# Patient Record
Sex: Female | Born: 1945 | Race: White | Hispanic: No | Smoking: Former smoker
Health system: Southern US, Community
[De-identification: ages and names within clinical notes are randomized; demographics above are authoritative.]

## PROBLEM LIST (undated history)

## (undated) DIAGNOSIS — J189 Pneumonia, unspecified organism: Secondary | ICD-10-CM

## (undated) DIAGNOSIS — I739 Peripheral vascular disease, unspecified: Secondary | ICD-10-CM

## (undated) DIAGNOSIS — R0600 Dyspnea, unspecified: Secondary | ICD-10-CM

## (undated) DIAGNOSIS — Z8619 Personal history of other infectious and parasitic diseases: Secondary | ICD-10-CM

## (undated) DIAGNOSIS — Z8719 Personal history of other diseases of the digestive system: Secondary | ICD-10-CM

## (undated) DIAGNOSIS — R112 Nausea with vomiting, unspecified: Secondary | ICD-10-CM

## (undated) DIAGNOSIS — E119 Type 2 diabetes mellitus without complications: Secondary | ICD-10-CM

## (undated) DIAGNOSIS — Z9889 Other specified postprocedural states: Secondary | ICD-10-CM

## (undated) DIAGNOSIS — Z923 Personal history of irradiation: Secondary | ICD-10-CM

## (undated) DIAGNOSIS — M1990 Unspecified osteoarthritis, unspecified site: Secondary | ICD-10-CM

## (undated) DIAGNOSIS — C801 Malignant (primary) neoplasm, unspecified: Secondary | ICD-10-CM

## (undated) DIAGNOSIS — C3490 Malignant neoplasm of unspecified part of unspecified bronchus or lung: Secondary | ICD-10-CM

## (undated) DIAGNOSIS — F17200 Nicotine dependence, unspecified, uncomplicated: Secondary | ICD-10-CM

## (undated) HISTORY — DX: Unspecified osteoarthritis, unspecified site: M19.90

## (undated) HISTORY — DX: Personal history of irradiation: Z92.3

## (undated) HISTORY — PX: FRACTURE SURGERY: SHX138

## (undated) HISTORY — PX: CATARACT EXTRACTION W/ INTRAOCULAR LENS IMPLANT: SHX1309

## (undated) HISTORY — DX: Type 2 diabetes mellitus without complications: E11.9

## (undated) HISTORY — DX: Personal history of other infectious and parasitic diseases: Z86.19

## (undated) HISTORY — PX: ANKLE FRACTURE SURGERY: SHX122

## (undated) HISTORY — PX: CATARACT EXTRACTION: SUR2

## (undated) HISTORY — DX: Nicotine dependence, unspecified, uncomplicated: F17.200

## (undated) HISTORY — PX: TUBAL LIGATION: SHX77

---

## 1976-04-13 HISTORY — PX: BREAST BIOPSY: SHX20

## 1979-04-14 HISTORY — PX: OTHER SURGICAL HISTORY: SHX169

## 1983-04-14 HISTORY — PX: BREAST EXCISIONAL BIOPSY: SUR124

## 1998-10-07 ENCOUNTER — Other Ambulatory Visit: Admission: RE | Admit: 1998-10-07 | Discharge: 1998-10-07 | Payer: Self-pay | Admitting: *Deleted

## 2000-01-26 ENCOUNTER — Other Ambulatory Visit: Admission: RE | Admit: 2000-01-26 | Discharge: 2000-01-26 | Payer: Self-pay | Admitting: *Deleted

## 2004-06-23 ENCOUNTER — Other Ambulatory Visit: Admission: RE | Admit: 2004-06-23 | Discharge: 2004-06-23 | Payer: Self-pay | Admitting: Obstetrics and Gynecology

## 2004-06-24 ENCOUNTER — Encounter: Admission: RE | Admit: 2004-06-24 | Discharge: 2004-06-24 | Payer: Self-pay | Admitting: Obstetrics and Gynecology

## 2006-04-14 ENCOUNTER — Ambulatory Visit (HOSPITAL_COMMUNITY): Admission: RE | Admit: 2006-04-14 | Discharge: 2006-04-14 | Payer: Self-pay | Admitting: Orthopedic Surgery

## 2006-05-06 ENCOUNTER — Encounter: Admission: RE | Admit: 2006-05-06 | Discharge: 2006-06-29 | Payer: Self-pay | Admitting: Orthopedic Surgery

## 2006-10-25 ENCOUNTER — Other Ambulatory Visit: Admission: RE | Admit: 2006-10-25 | Discharge: 2006-10-25 | Payer: Self-pay | Admitting: Obstetrics and Gynecology

## 2009-04-13 HISTORY — PX: POLYPECTOMY: SHX149

## 2009-04-13 HISTORY — PX: BREAST EXCISIONAL BIOPSY: SUR124

## 2009-04-13 HISTORY — PX: COLONOSCOPY: SHX174

## 2009-09-03 ENCOUNTER — Other Ambulatory Visit: Admission: RE | Admit: 2009-09-03 | Discharge: 2009-09-03 | Payer: Self-pay | Admitting: Family Medicine

## 2009-09-03 ENCOUNTER — Encounter (INDEPENDENT_AMBULATORY_CARE_PROVIDER_SITE_OTHER): Payer: Self-pay | Admitting: *Deleted

## 2009-09-03 ENCOUNTER — Ambulatory Visit: Payer: Self-pay | Admitting: Family Medicine

## 2009-09-03 DIAGNOSIS — Z9189 Other specified personal risk factors, not elsewhere classified: Secondary | ICD-10-CM | POA: Insufficient documentation

## 2009-09-03 DIAGNOSIS — M129 Arthropathy, unspecified: Secondary | ICD-10-CM | POA: Insufficient documentation

## 2009-09-03 DIAGNOSIS — F17200 Nicotine dependence, unspecified, uncomplicated: Secondary | ICD-10-CM | POA: Insufficient documentation

## 2009-09-03 DIAGNOSIS — E1169 Type 2 diabetes mellitus with other specified complication: Secondary | ICD-10-CM | POA: Insufficient documentation

## 2009-09-03 DIAGNOSIS — E7800 Pure hypercholesterolemia, unspecified: Secondary | ICD-10-CM

## 2009-09-03 DIAGNOSIS — J309 Allergic rhinitis, unspecified: Secondary | ICD-10-CM | POA: Insufficient documentation

## 2009-09-03 LAB — HM PAP SMEAR

## 2009-09-03 LAB — CONVERTED CEMR LAB
Cholesterol, target level: 200 mg/dL
HDL goal, serum: 40 mg/dL
LDL Goal: 160 mg/dL

## 2009-09-12 LAB — CONVERTED CEMR LAB: Pap Smear: NEGATIVE

## 2009-09-13 ENCOUNTER — Encounter (INDEPENDENT_AMBULATORY_CARE_PROVIDER_SITE_OTHER): Payer: Self-pay | Admitting: *Deleted

## 2009-09-17 ENCOUNTER — Ambulatory Visit: Payer: Self-pay | Admitting: Family Medicine

## 2009-09-23 LAB — CONVERTED CEMR LAB
ALT: 16 units/L (ref 0–35)
AST: 20 units/L (ref 0–37)
Albumin: 4.4 g/dL (ref 3.5–5.2)
Alkaline Phosphatase: 88 units/L (ref 39–117)
BUN: 12 mg/dL (ref 6–23)
Bilirubin, Direct: 0.1 mg/dL (ref 0.0–0.3)
CO2: 26 meq/L (ref 19–32)
Calcium: 10 mg/dL (ref 8.4–10.5)
Chloride: 107 meq/L (ref 96–112)
Cholesterol: 326 mg/dL — ABNORMAL HIGH (ref 0–200)
Creatinine, Ser: 0.7 mg/dL (ref 0.4–1.2)
Direct LDL: 213.5 mg/dL
GFR calc non Af Amer: 86.26 mL/min (ref >60)
Glucose, Bld: 130 mg/dL — ABNORMAL HIGH (ref 70–99)
HDL: 64.2 mg/dL (ref >39.00)
Potassium: 4.3 meq/L (ref 3.5–5.1)
Sodium: 142 meq/L (ref 135–145)
TSH: 2.05 microintl units/mL (ref 0.35–5.50)
Total Bilirubin: 0.8 mg/dL (ref 0.3–1.2)
Total CHOL/HDL Ratio: 5
Total Protein: 7.1 g/dL (ref 6.0–8.3)
Triglycerides: 271 mg/dL — ABNORMAL HIGH (ref 0.0–149.0)
VLDL: 54.2 mg/dL — ABNORMAL HIGH (ref 0.0–40.0)

## 2009-09-26 ENCOUNTER — Ambulatory Visit: Payer: Self-pay | Admitting: Family Medicine

## 2009-09-26 DIAGNOSIS — E119 Type 2 diabetes mellitus without complications: Secondary | ICD-10-CM | POA: Insufficient documentation

## 2009-09-27 LAB — CONVERTED CEMR LAB
Creatinine,U: 131.5 mg/dL
Hgb A1c MFr Bld: 5.7 % (ref 4.6–6.5)
Microalb Creat Ratio: 1.8 mg/g (ref 0.0–30.0)
Microalb, Ur: 2.4 mg/dL — ABNORMAL HIGH (ref 0.0–1.9)

## 2009-10-02 ENCOUNTER — Encounter: Payer: Self-pay | Admitting: Family Medicine

## 2009-10-07 ENCOUNTER — Telehealth: Payer: Self-pay | Admitting: Family Medicine

## 2009-10-08 ENCOUNTER — Encounter: Admission: RE | Admit: 2009-10-08 | Discharge: 2009-10-08 | Payer: Self-pay | Admitting: Family Medicine

## 2009-10-08 ENCOUNTER — Encounter (INDEPENDENT_AMBULATORY_CARE_PROVIDER_SITE_OTHER): Payer: Self-pay | Admitting: *Deleted

## 2009-10-08 DIAGNOSIS — M810 Age-related osteoporosis without current pathological fracture: Secondary | ICD-10-CM | POA: Insufficient documentation

## 2009-10-08 LAB — HM MAMMOGRAPHY

## 2009-10-09 ENCOUNTER — Ambulatory Visit: Payer: Self-pay | Admitting: Gastroenterology

## 2009-10-15 ENCOUNTER — Ambulatory Visit: Payer: Self-pay | Admitting: Family Medicine

## 2009-10-16 LAB — CONVERTED CEMR LAB
BUN: 10 mg/dL (ref 6–23)
CO2: 27 meq/L (ref 19–32)
Calcium: 9.9 mg/dL (ref 8.4–10.5)
Chloride: 107 meq/L (ref 96–112)
Creatinine, Ser: 0.7 mg/dL (ref 0.4–1.2)
GFR calc non Af Amer: 95.35 mL/min (ref >60)
Glucose, Bld: 96 mg/dL (ref 70–99)
Potassium: 4.4 meq/L (ref 3.5–5.1)
Sodium: 140 meq/L (ref 135–145)

## 2009-10-23 ENCOUNTER — Ambulatory Visit: Payer: Self-pay | Admitting: Gastroenterology

## 2009-10-23 LAB — HM COLONOSCOPY

## 2009-10-25 ENCOUNTER — Encounter: Payer: Self-pay | Admitting: Gastroenterology

## 2009-12-17 ENCOUNTER — Ambulatory Visit: Payer: Self-pay | Admitting: Family Medicine

## 2009-12-17 LAB — CONVERTED CEMR LAB
ALT: 16 units/L (ref 0–35)
AST: 17 units/L (ref 0–37)
Albumin: 4.3 g/dL (ref 3.5–5.2)
Alkaline Phosphatase: 73 units/L (ref 39–117)
BUN: 14 mg/dL (ref 6–23)
Bilirubin, Direct: 0.1 mg/dL (ref 0.0–0.3)
CO2: 27 meq/L (ref 19–32)
Calcium: 9.9 mg/dL (ref 8.4–10.5)
Chloride: 107 meq/L (ref 96–112)
Cholesterol: 284 mg/dL — ABNORMAL HIGH (ref 0–200)
Creatinine, Ser: 0.6 mg/dL (ref 0.4–1.2)
Direct LDL: 204.4 mg/dL
GFR calc non Af Amer: 110.62 mL/min (ref >60)
Glucose, Bld: 90 mg/dL (ref 70–99)
HDL: 54.8 mg/dL (ref >39.00)
Hgb A1c MFr Bld: 5.8 % (ref 4.6–6.5)
Potassium: 4.4 meq/L (ref 3.5–5.1)
Sodium: 142 meq/L (ref 135–145)
Total Bilirubin: 0.7 mg/dL (ref 0.3–1.2)
Total CHOL/HDL Ratio: 5
Total Protein: 6.8 g/dL (ref 6.0–8.3)
Triglycerides: 161 mg/dL — ABNORMAL HIGH (ref 0.0–149.0)
VLDL: 32.2 mg/dL (ref 0.0–40.0)

## 2009-12-24 ENCOUNTER — Ambulatory Visit: Payer: Self-pay | Admitting: Family Medicine

## 2009-12-24 DIAGNOSIS — R42 Dizziness and giddiness: Secondary | ICD-10-CM | POA: Insufficient documentation

## 2010-03-14 ENCOUNTER — Telehealth: Payer: Self-pay | Admitting: Family Medicine

## 2010-03-20 ENCOUNTER — Ambulatory Visit: Payer: Self-pay | Admitting: Family Medicine

## 2010-03-20 DIAGNOSIS — R110 Nausea: Secondary | ICD-10-CM | POA: Insufficient documentation

## 2010-03-21 LAB — CONVERTED CEMR LAB
ALT: 15 units/L (ref 0–35)
AST: 18 units/L (ref 0–37)
Albumin: 4.2 g/dL (ref 3.5–5.2)
Alkaline Phosphatase: 86 units/L (ref 39–117)
BUN: 14 mg/dL (ref 6–23)
Basophils Absolute: 0.1 10*3/uL (ref 0.0–0.1)
Basophils Relative: 0.7 % (ref 0.0–3.0)
Bilirubin, Direct: 0.1 mg/dL (ref 0.0–0.3)
CO2: 28 meq/L (ref 19–32)
Calcium: 10.4 mg/dL (ref 8.4–10.5)
Chloride: 104 meq/L (ref 96–112)
Cholesterol: 252 mg/dL — ABNORMAL HIGH (ref 0–200)
Creatinine, Ser: 0.7 mg/dL (ref 0.4–1.2)
Direct LDL: 169.1 mg/dL
Eosinophils Absolute: 0.2 10*3/uL (ref 0.0–0.7)
Eosinophils Relative: 2.1 % (ref 0.0–5.0)
GFR calc non Af Amer: 96.91 mL/min (ref >60.00)
Glucose, Bld: 106 mg/dL — ABNORMAL HIGH (ref 70–99)
HCT: 40.4 % (ref 36.0–46.0)
HDL: 56.3 mg/dL (ref >39.00)
Hemoglobin: 13.7 g/dL (ref 12.0–15.0)
Hgb A1c MFr Bld: 5.6 % (ref 4.6–6.5)
Lymphocytes Relative: 27.8 % (ref 12.0–46.0)
Lymphs Abs: 2.3 10*3/uL (ref 0.7–4.0)
MCHC: 33.9 g/dL (ref 30.0–36.0)
MCV: 97.3 fL (ref 78.0–100.0)
Monocytes Absolute: 0.5 10*3/uL (ref 0.1–1.0)
Monocytes Relative: 6.4 % (ref 3.0–12.0)
Neutro Abs: 5.3 10*3/uL (ref 1.4–7.7)
Neutrophils Relative %: 63 % (ref 43.0–77.0)
Platelets: 265 10*3/uL (ref 150.0–400.0)
Potassium: 4.4 meq/L (ref 3.5–5.1)
RBC: 4.15 M/uL (ref 3.87–5.11)
RDW: 13 % (ref 11.5–14.6)
Sodium: 139 meq/L (ref 135–145)
Total Bilirubin: 0.6 mg/dL (ref 0.3–1.2)
Total CHOL/HDL Ratio: 4
Total Protein: 6.8 g/dL (ref 6.0–8.3)
Triglycerides: 126 mg/dL (ref 0.0–149.0)
VLDL: 25.2 mg/dL (ref 0.0–40.0)
WBC: 8.4 10*3/uL (ref 4.5–10.5)

## 2010-03-25 ENCOUNTER — Ambulatory Visit: Payer: Self-pay | Admitting: Family Medicine

## 2010-03-25 ENCOUNTER — Encounter: Payer: Self-pay | Admitting: Family Medicine

## 2010-03-25 DIAGNOSIS — R079 Chest pain, unspecified: Secondary | ICD-10-CM | POA: Insufficient documentation

## 2010-03-25 DIAGNOSIS — R0789 Other chest pain: Secondary | ICD-10-CM | POA: Insufficient documentation

## 2010-03-25 DIAGNOSIS — R809 Proteinuria, unspecified: Secondary | ICD-10-CM | POA: Insufficient documentation

## 2010-03-25 LAB — HM DIABETES FOOT EXAM

## 2010-03-25 LAB — CONVERTED CEMR LAB
Cholesterol, target level: 200 mg/dL
HDL goal, serum: 40 mg/dL
LDL Goal: 100 mg/dL

## 2010-04-22 ENCOUNTER — Ambulatory Visit: Admit: 2010-04-22 | Payer: Self-pay

## 2010-05-04 ENCOUNTER — Encounter: Payer: Self-pay | Admitting: Obstetrics and Gynecology

## 2010-05-13 NOTE — Letter (Signed)
Summary: Katelyn Kennedy Instructions  [facility]  [address] [place], [place] [address]   Phone: [phone]  Fax: [phone]       Katelyn Kennedy    [birth year]    MRN: [id number]        Procedure Day [name]:  [id number]  10/23/09     Arrival Time:  8:00AM     Procedure Time:  9:00AM     Location of Procedure:                    [facility]  [place] (4th Floor)                       PREPARATION FOR COLONOSCOPY WITH MOVIPREP   Starting 5 days prior to your procedure 10/18/09 do not eat nuts, seeds, popcorn, corn, beans, peas,  salads, or any raw vegetables.  Do not take any fiber supplements (e.g. Metamucil, Citrucel, and Benefiber).  THE DAY BEFORE YOUR PROCEDURE         DATE: 10/22/09  DAY: TUESDAY  1.  Drink clear liquids the entire day-NO SOLID FOOD  2.  Do not drink anything colored red or purple.  Avoid juices with pulp.  No orange juice.  3.  Drink at least 64 oz. (8 glasses) of fluid/clear liquids during the day to prevent dehydration and help the prep work efficiently.  CLEAR LIQUIDS INCLUDE: Water Jello Ice Popsicles Tea (sugar ok, no milk/cream) Powdered fruit flavored drinks Coffee (sugar ok, no milk/cream) Gatorade Juice: apple, white grape, white cranberry  Lemonade Clear bullion, consomm, broth Carbonated beverages (any kind) Strained chicken noodle soup Hard Candy                             4.  In the morning, mix first dose of MoviPrep solution:    Empty 1 Pouch A and 1 Pouch B into the disposable container    Add lukewarm drinking water to the top line of the container. Mix to dissolve    Refrigerate (mixed solution should be used within 24 hrs)  5.  Begin drinking the prep at 5:00 p.m. The MoviPrep container is divided by 4 marks.   Every 15 minutes drink the solution down to the next mark (approximately 8 oz) until the full liter is complete.   6.  Follow completed prep with 16 oz of clear liquid of your choice  (Nothing red or purple).  Continue to drink clear liquids until bedtime.  7.  Before going to bed, mix second dose of MoviPrep solution:    Empty 1 Pouch A and 1 Pouch B into the disposable container    Add lukewarm drinking water to the top line of the container. Mix to dissolve    Refrigerate  THE DAY OF YOUR PROCEDURE      DATE: 10/23/09 DAY: WEDNESDAY  Beginning at 4:00AM (5 hours before procedure):         1. Every 15 minutes, drink the solution down to the next mark (approx 8 oz) until the full liter is complete.  2. Follow completed prep with 16 oz. of clear liquid of your choice.    3. You may drink clear liquids until 7:00AM (2 HOURS BEFORE PROCEDURE).   MEDICATION INSTRUCTIONS  Unless otherwise instructed, you should take regular prescription medications with a small sip of water   as early as possible the morning of your  procedure.        OTHER INSTRUCTIONS  You will need a responsible adult at least 67 years of age to accompany you and drive you home.   This person must remain in the waiting room during your procedure.  Wear loose fitting clothing that is easily removed.  Leave jewelry and other valuables at home.  However, you may wish to bring a book to read or  an iPod/MP3 player to listen to music as you wait for your procedure to start.  Remove all body piercing jewelry and leave at home.  Total time from sign-in until discharge is approximately 2-3 hours.  You should go home directly after your procedure and rest.  You can resume normal activities the  day after your procedure.  The day of your procedure you should not:   Drive   Make legal decisions   Operate machinery   Drink alcohol   Return to work  You will receive specific instructions about eating, activities and medications before you leave.    The above instructions have been reviewed and explained to me by   Katelyn Almas RN  October 09, 2009 2:02 PM     I fully understand  and can verbalize these instructions _____________________________ Date _________

## 2010-05-13 NOTE — Letter (Signed)
Summary: Results Letter  [facility]  [address] [place], [place] [address]   Phone: [phone]  Fax: [phone]        October 25, 2009 MRN: [id number]    Katelyn Kennedy [address] [place], [place]  [address]    Dear Ms. Mccullers,   One of the polyps removed during your recent procedure was proven to be adenomatous.  These are pre-cancerous polyps that may have grown into cancers if they had not been removed.  Based on current nationally recognized surveillance guidelines, I recommend that you have a repeat colonoscopy in 5 years.  We will therefore put your information in our reminder system and will contact you in 5 years to schedule a repeat procedure.  Please call if you have any questions or concerns.       Sincerely,  Rachael Fee MD  This letter has been electronically signed by your physician.  Appended Document: Results Letter letter mailed.

## 2010-05-13 NOTE — Miscellaneous (Signed)
Summary: [facility] Previsit/prep  Clinical Lists Changes  Medications: Added new medication of MOVIPREP 100 GM  SOLR (PEG-KCL-NACL-NASULF-NA ASC-C) As per prep instructions. - Signed Rx of MOVIPREP 100 GM  SOLR (PEG-KCL-NACL-NASULF-NA ASC-C) As per prep instructions.;  #1 x 0;  Signed;  Entered by: Wyona Almas RN;  Authorized by: Rachael Fee MD;  Method used: Electronically to [facility]  [address] [id number] [address] [place], [place]  [address], Ph: [phone] or [phone], Fax: [phone] Allergies: Added new allergy or adverse reaction of CODEINE Observations: Added new observation of NKA: F (10/09/2009 13:13)    Prescriptions: MOVIPREP 100 GM  SOLR (PEG-KCL-NACL-NASULF-NA ASC-C) As per prep instructions.  #1 x 0   Entered by:   Wyona Almas RN   Authorized by:   Rachael Fee MD   Signed by:   Wyona Almas RN on 10/09/2009   Method used:   [facility] to        [facility]  [address] [id number] ([facility]       [address]       [place], [place]  [address]       Ph: [phone] or [phone]       Fax: [phone]   RxID:   [id number]

## 2010-05-13 NOTE — Letter (Signed)
Summary: Previsit letter  [name]  [address] [place], [place] [address]   Phone: [phone]  Fax: [phone]       09/03/2009 MRN: [id number]  [facility] [address] [place], [place]  [address]  Dear Katelyn Kennedy,  Welcome to the Gastroenterology Division at [facility].    You are scheduled to see a nurse for your pre-procedure visit on 10-09-09 at 1:30p.m. on the 3rd floor at [facility], [address]  We ask that you try to arrive at our office 15 minutes prior to your appointment time to allow for check-in.  Your nurse visit will consist of discussing your medical and surgical history, your immediate family medical history, and your medications.    Please bring a complete list of all your medications or, if you prefer, bring the medication bottles and we will list them.  We will need to be aware of both prescribed and over the counter drugs.  We will need to know exact dosage information as well.  If you are on blood thinners (Coumadin, Plavix, Aggrenox, Ticlid, etc.) please call our office today/prior to your appointment, as we need to consult with your physician about holding your medication.   Please be prepared to read and sign documents such as consent forms, a financial agreement, and acknowledgement forms.  If necessary, and with your consent, a friend or relative is welcome to sit-in on the nurse visit with you.  Please bring your insurance card so that we may make a copy of it.  If your insurance requires a referral to see a specialist, please bring your referral form from your primary care physician.  No co-pay is required for this nurse visit.     If you cannot keep your appointment, please call [phone] to cancel or reschedule prior to your appointment date.  This allows [name] the opportunity to schedule an appointment for another patient in need of care.    Thank you for choosing [facility] for your medical  needs.  We appreciate the opportunity to care for you.  Please visit [name] at our website  to learn more about our practice.                     Sincerely.                                                                                                                   The Gastroenterology Division

## 2010-05-13 NOTE — Progress Notes (Signed)
Summary: has nausea off and on  Phone Note Call from Patient Call back at Home Phone [phone]   Caller: Patient Call For: Kerby Nora MD Summary of Call: Pt states she has nausea off and on, no vomiting and not severe.  She wanted you to be aware of this in case you wanted to add something to her labs on 12/8.  She has appt on to see you on 12/13.  She also complains of constant sinus drainage, which I told her could cause nausea.   Initial call taken by: Lowella Petties CMA, AAMA,  March 14, 2010 1:12 PM  Follow-up for Phone Call        ADd cbc, CMET Dx 787.02  Additional Follow-up for Phone Call Additional follow up Details #1::        labs added to labs on 12/8 Additional Follow-up by: Benny Lennert CMA Duncan Dull),  March 14, 2010 2:29 PM

## 2010-05-13 NOTE — Progress Notes (Signed)
Summary: ? appt.  Phone Note Call from Patient Call back at Home Phone [phone]   Caller: Patient Call For: Katelyn Nora MD Summary of Call: Patient is asking if she needs to make an appointment (lab or OV) sooner than September since starting the Lisinopril? Initial call taken by: Delilah Shan CMA Duncan Dull),  October 07, 2009 8:55 AM  Follow-up for Phone Call        She needs repeat BMET now since it is past 7-10 days after staring med, but no appt with me until Sept.  ..very low dose...should minimally affect BP. She can follow BP at pharm or home. Dx 250.00 Follow-up by: Katelyn Nora MD,  October 08, 2009 2:07 PM  Additional Follow-up for Phone Call Additional follow up Details #1::        Patient advised and appt made for 10-09-09 Additional Follow-up by: Benny Lennert CMA Duncan Dull),  October 08, 2009 2:12 PM

## 2010-05-13 NOTE — Assessment & Plan Note (Signed)
Summary: NEW MEDICARE PT TO ESTABH/DLO   Vital Signs:  Patient profile:   65 year old female Height:      61 inches Weight:      131.0 pounds BMI:     24.84 Temp:     98.2 degrees F oral Pulse rate:   76 / minute Pulse rhythm:   regular BP sitting:   120 / 70  (left arm) Cuff size:   regular  Vitals Entered By: Benny Lennert CMA Duncan Dull) (Sep 03, 2009 9:48 AM)  History of Present Illness: Chief complaint new patient to be established (medicare)  Has not had a regular MD..seen [facility] acutely in past.   Patient past medical history, social history, and family history reviewed in detail no significant changes.  Patient continues to walk 20 minutes every few days...recommended increasing activity. She states she rarely sits and is always active. .  Screening for Depression is negative and mood is good. Hearing is normal, and able to perform activities of daily living. Risk of falling is negligible and home safety has been reviewed and is appropriate. Patient has normal height, weight, hearing, and visual acuity. Cognitive impairment assessment involving motor skills, memory, attention span amd alertness appears to be intact.   Patient has been counseled on age-appropriate routine health concerns for screening and prevention. Labs were ordered as appropriate. Education, counseling, and referrals are performed based on age appropriate health issues. Written checklist of recommended age appropriate screening as per USPSTF provided. Immunizations are up-to-date or declined.  Precontemplative for smoking cessation. Decreasing amount on her own.       Lipid Management History:      Positive NCEP/ATP III risk factors include female age 14 years old or older and current tobacco user.        Comments: In past total chol 344...made diet changes..improved some..last check 2008.  No statin use in past. .   Preventive Screening-Counseling & Management  Alcohol-Tobacco     Smoking Status:  current  Caffeine-Diet-Exercise     Does Patient Exercise: yes      Drug Use:  no.    Allergies (verified): No Known Drug Allergies  Past History:  Past medical, surgical, family and social histories (including risk factors) reviewed, and no changes noted (except as noted below).  Past Medical History: Current Problems:  HYPERCHOLESTEROLEMIA (ICD-272.0) CHICKENPOX, HX OF (ICD-V15.9) ARTHRITIS (ICD-716.90)   Allergic rhinitis  Past Surgical History: bartholyn gland removal 1981 left broken ankle  2008 cataract 1978 benign breast cyst biopsy  Family History: Reviewed history and no changes required. Family History of Arthritis Family History of Colon CA 1st degree relative <60 Family History Diabetes 1st degree relative Family History of Stroke F 1st degree relative <60 Family History of Stroke M 1st degree relative <50 Family History of Cardiovascular disorder father: died age 57 MVA mother: DM, old age sister: breast cancer age 66  3 brother: healthy Family History Breast cancer 1st degree relative <50 MGM: colon cancer  Social History: Reviewed history and no changes required. Occupation: Retired Divorced Current Smoker: >25 pacjk year histoy..currently smokes 1/2 pack per day Alcohol use-no Drug use-no Regular exercise-yes Occupation:  employed Smoking Status:  current Drug Use:  no Does Patient Exercise:  yes  Review of Systems General:  Denies fatigue and fever. CV:  Denies chest pain or discomfort. Resp:  Denies cough and shortness of breath. GI:  Denies abdominal pain and bloody stools. GU:  Denies dysuria. Derm:  Denies lesion(s). Neuro:  Denies difficulty with concentration, headaches, inability to speak, memory loss, numbness, and poor balance. Psych:  Denies anxiety and depression.  Physical Exam  General:  Well-developed,well-nourished,in no acute distress; alert,appropriate and cooperative throughout examination Eyes:  No corneal or  conjunctival inflammation noted. EOMI. Perrla. Funduscopic exam benign, without hemorrhages, exudates or papilledema. Vision grossly normal. Ears:  External ear exam shows no significant lesions or deformities.  Otoscopic examination reveals clear canals, tympanic membranes are intact bilaterally without bulging, retraction, inflammation or discharge. Hearing is grossly normal bilaterally. Nose:  External nasal examination shows no deformity or inflammation. Nasal mucosa are pink and moist without lesions or exudates. Mouth:  Oral mucosa and oropharynx without lesions or exudates.  Teeth in good repair. Neck:  no carotid bruit or thyromegaly no cervical or supraclavicular lymphadenopathy  Chest Wall:  No deformities, masses, or tenderness noted. Breasts:  No mass, nodules, thickening, tenderness, bulging, retraction, inflamation, nipple discharge or skin changes noted.   Lungs:  Normal respiratory effort, chest expands symmetrically. Lungs are clear to auscultation, no crackles or wheezes. Heart:  Normal rate and regular rhythm. S1 and S2 normal without gallop, murmur, click, rub or other extra sounds. Abdomen:  Bowel sounds positive,abdomen soft and non-tender without masses, organomegaly or hernias noted. Genitalia:  Pelvic Exam:        External: normal female genitalia without lesions or masses        Vagina: normal without lesions or masses        Cervix: normal without lesions or masses        Adnexa: normal bimanual exam without masses or fullness        Uterus: normal by palpation        Pap smear: performed Msk:  No deformity or scoliosis noted of thoracic or lumbar spine.   Pulses:  R and L posterior tibial pulses are full and equal bilaterally  Extremities:  no edema  Neurologic:  No cranial nerve deficits noted. Station and gait are normal. Plantar reflexes are down-going bilaterally. DTRs are symmetrical throughout. Sensory, motor and coordinative functions appear intact. Skin:   Intact without suspicious lesions or rashes Psych:  Cognition and judgment appear intact. Alert and cooperative with normal attention span and concentration. No apparent delusions, illusions, hallucinations   Impression & Recommendations:  Problem # 1:  PHYSICAL EXAMINATION (ICD-V70.0) The patient's preventative maintenance and recommended screening tests for an annual wellness exam were reviewed in full today. Brought up to date unless services declined.  Counselled on the importance of diet, exercise, and its role in overall health and mortality. The patient's FH and SH was reviewed, including their home life, tobacco status, and drug and alcohol status.     Problem # 2:  ROUTINE GYNECOLOGICAL EXAMINATION (ICD-V72.31)  Orders: Pelvic & Breast Exam ( Medicare)  (G0101)  Problem # 3:  HYPERCHOLESTEROLEMIA (ICD-272.0) Due for reeval.   Complete Medication List: 1)  Fish Oil 1000 Mg Caps (Omega-3 fatty acids) .... When she remembers 2)  Multivitamins Caps (Multiple vitamin) .... Once daily 3)  Aspir-low 81 Mg Tbec (Aspirin) .... Once daily 4)  Calcium-vitamin D 600-200 Mg-unit Tabs (Calcium-vitamin d) .Marland Kitchen.. 1 tab by mouth two times a day  Other Orders: Radiology Referral (Radiology) Gastroenterology Referral (GI) TD Toxoids IM 7 YR + ([address]) Pneumococcal Vaccine ([address]) Admin 1st Vaccine ([address]) Admin of Any Addtl Vaccine ([address]) Admin 1st Vaccine (State) [phone]) Admin of Any Addtl Vaccine (State) (30865H)  Lipid Assessment/Plan:      Based on NCEP/ATP  III, the patient's risk factor category is "2 or more risk factors and a calculated 10 year CAD risk of > 20%".  The patient's lipid goals are as follows: Total cholesterol goal is 200; LDL cholesterol goal is 160; HDL cholesterol goal is 40; Triglyceride goal is 150.    Patient Instructions: 1)  Fasting lipids, CMET Dx 272.0. 2)  Referral Appointment Information 3)  Day/Date: 4)  Time: 5)  Place/MD: 6)  Address: 7)   Phone/Fax: 8)  Patient given appointment information. Information/Orders faxed/mailed.   Current Allergies (reviewed today): No known allergies   Flu Vaccine Result Date:  01/11/2009 Flu Vaccine Result:  given Flu Vaccine Next Due:  1 yr TD Result Date:  04/13/1997 TD Result:  given TD Next Due:  10 yr Herpes Zoster Next Due:  Refused Flex Sig Next Due:  Not Indicated Hemoccult Next Due:  Not Indicated Mammogram Result Date:  04/13/2004 Mammogram Result:  normal Mammogram Next Due:  1 yr      Past Medical History:    Reviewed history and no changes required:       Current Problems:        HYPERCHOLESTEROLEMIA (ICD-272.0)       CHICKENPOX, HX OF (ICD-V15.9)       ARTHRITIS (ICD-716.90)               Allergic rhinitis  Past Surgical History:    Reviewed history and no changes required:       bartholyn gland removal 1981       left broken ankle  2008       cataract       1978 benign breast cyst biopsy      Tetanus/Td Vaccine    Vaccine Type: Td    Site: right deltoid    Mfr: Sanofi Pasteur    Dose: 0.5 ml    Route: IM    Given by: Benny Lennert CMA (AAMA)    Exp. Date: 02/26/2011    Lot #: [id number]    VIS given: 03/01/07 version given Sep 03, 2009.  Pneumovax Vaccine    Vaccine Type: Pneumovax    Site: left deltoid    Mfr: Merck    Dose: 0.5 ml    Route: Warrior    Given by: Benny Lennert CMA (AAMA)    Exp. Date: 02/05/2011    Lot #: [id number]    VIS given: 11/09/95 version given Sep 03, 2009.

## 2010-05-13 NOTE — Procedures (Signed)
Summary: Colonoscopy  Patient: Katelyn Kennedy Note: All result statuses are Final unless otherwise noted.  Tests: (1) Colonoscopy (COL)   COL Colonoscopy           DONE     [facility]     [address]     [place], [place]  [address]           COLONOSCOPY PROCEDURE REPORT     PATIENT:  Katelyn Kennedy, Katelyn Kennedy  MR#:  [id number]     BIRTHDATE:  03/19/1946, 63 yrs. old  GENDER:  female     ENDOSCOPIST:  Rachael Fee, MD     REF. BY:  Excell Seltzer, M.D.     PROCEDURE DATE:  10/23/2009     PROCEDURE:  Colonoscopy with snare polypectomy     ASA CLASS:  Class II     INDICATIONS:  Routine Risk Screening     MEDICATIONS:   Fentanyl 100 mcg IV, Versed 10 mg IV     DESCRIPTION OF PROCEDURE:   After the risks benefits and     alternatives of the procedure were thoroughly explained, informed     consent was obtained.  Digital rectal exam was performed and     revealed no rectal masses.   The LB [id number] endoscope     was introduced through the anus and advanced to the cecum, which     was identified by both the appendix and ileocecal valve, without     limitations.  The quality of the prep was good, using MoviPrep.     The instrument was then slowly withdrawn as the colon was fully     examined.     <<PROCEDUREIMAGES>>     FINDINGS:   Four small sessile polyps were found, all removed with     cold snare and all were sent to pathology. These ranged in size     from 3mm to 4mm, were located in transverse, sigmoid and rectum     segments (see image5 and image6).  Mild diverticulosis was found     throughout the colon (see image2).  This was otherwise a normal     examination of the colon (see image3, image4, and image7).     Retroflexed views in the rectum revealed no abnormalities.    The     scope was then withdrawn from the patient and the procedure     completed.     COMPLICATIONS:  None           ENDOSCOPIC IMPRESSION:     1) Four small polyps, all removed and sent to  pathology     2) Mild diverticulosis throughout the colon     3) Otherwise normal examination           RECOMMENDATIONS:     1) If the polyp(s) removed today are proven to be adenomatous     (pre-cancerous) polyps, you will need a colonoscopy in 3-5 years.     Otherwise you should continue to follow colorectal cancer     screening guidelines for "routine risk" patients with a     colonoscopy in 10 years.     2) You will receive a letter within 1-2 weeks with the results     of your biopsy as well as final recommendations. Please call my     office if you have not received a letter after 3 weeks.           ______________________________  Rachael Fee, MD           n.     Katelyn Kennedy:   Rachael Fee at 10/23/2009 10:00 AM           Oswaldo Conroy, [id number]  Note: An exclamation mark (!) indicates a result that was not dispersed into the flowsheet. Document Creation Date: 10/23/2009 10:02 AM _______________________________________________________________________  (1) Order result status: Final Collection or observation date-time: 10/23/2009 09:54 Requested date-time:  Receipt date-time:  Reported date-time:  Referring Physician:   Ordering Physician: Rob Bunting [phone]) Specimen Source:  Source: Launa Grill Order Number: [id number] Lab site:   Appended Document: Colonoscopy     Procedures Next Due Date:    Colonoscopy: 10/2014

## 2010-05-13 NOTE — Assessment & Plan Note (Signed)
Summary: FOLLOW-UP ON LABS   Vital Signs:  Patient profile:   65 year old female Height:      61 inches Weight:      113 pounds BMI:     21.43 Temp:     98.8 degrees F oral Pulse rate:   68 / minute Pulse rhythm:   regular BP sitting:   100 / 60  (left arm) Cuff size:   regular  Vitals Entered By: Benny Lennert CMA Duncan Dull) (December 24, 2009 2:22 PM) CC: follow-up visit after labs, Lipid Management   History of Present Illness: DM, well controlled:With diet and lifestyle   BP at goal on lisinopril <130/80. Lisinopril started for microalbuminuria. Some dizzyness and low BPs intermittantly with this med.   Has made change in diet to be healthier..has lost 18 lbs total. Watching fat and sugar, smaller portions.  Somewhat fster than she thought she would loose, but no additional symptoms, feels well. no night sweats, fever. Eating three meals a day.   Triglycerides dropped with fish oil...2400 mg fish oil. [facility] to [facility] treatment for LDL...goal <100.   Lipid Management History:      Positive NCEP/ATP III risk factors include female age 55 years old or older, diabetes, and current tobacco user.        Her compliance with the TLC diet is fair.  The patient expresses understanding of adjunctive measures for cholesterol lowering.  Adjunctive measures started by the patient include aerobic exercise, ASA, omega-3 supplements, limit alcohol consumpton, and weight reduction.      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) 2)  Osteopenia  (ICD-733.90) 3)  Diabetes Mellitus, Type II  (ICD-250.00) 4)  Physical Examination  (ICD-V70.0) 5)  Routine Gynecological Examination  (ICD-V72.31) 6)  Special Screening For Malignant Neoplasms Colon  (ICD-V76.51) 7)  Special Screening For  Osteoporosis  (ICD-V82.81) 8)  Other Screening Mammogram  (ICD-V76.12) 9)  Tobacco Abuse  (ICD-305.1) 10)  Family History Breast Cancer 1st Degree Relative <50  (ICD-V16.3) 11)  Allergic Rhinitis  (ICD-477.9) 12)  Family History Diabetes 1st Degree Relative  (ICD-V18.0) 13)  Family History of Colon Ca 1st Degree Relative <60  (ICD-V16.0) 14)  Hypercholesterolemia  (ICD-272.0) 15)  Chickenpox, Hx of  (ICD-V15.9) 16)  Arthritis  (ICD-716.90)  Current Medications (verified): 1)  Fish Oil 1000 Mg Caps (Omega-3 Fatty Acids) .... When She Remembers 2)  Multivitamins  Caps (Multiple Vitamin) .... Once Daily 3)  Aspir-Low 81 Mg Tbec (Aspirin) .... Once Daily 4)  Calcium-Vitamin D 600-200 Mg-Unit Tabs (Calcium-Vitamin D) .Marland Kitchen.. 1 Tab By Mouth Two Times A Day 5)  Lisinopril 5 Mg Tabs (Lisinopril) .Marland Kitchen.. 1 Tab By Mouth Daily  Allergies: 1)  ! Codeine  Past History:  Past medical, surgical, family and social histories (including risk factors) reviewed, and no changes noted (except as noted below).  Past Medical History: Reviewed history from 09/03/2009 and no changes required. Current Problems:  HYPERCHOLESTEROLEMIA (ICD-272.0) CHICKENPOX, HX OF (ICD-V15.9) ARTHRITIS (ICD-716.90)   Allergic rhinitis  Past Surgical History: Reviewed history from 09/03/2009 and no changes required. bartholyn gland removal 1981 left broken ankle  2008 cataract 1978 benign breast cyst biopsy  Family History: Reviewed history from 09/03/2009 and no changes required. Family History of Arthritis Family History of Colon CA 1st degree relative <60 Family History Diabetes  1st degree relative Family History of Stroke F 1st degree relative <60 Family History of Stroke M 1st degree relative <50 Family History of Cardiovascular disorder father: died age 65 MVA mother: DM, old age sister: breast cancer age 90  3 brother: healthy Family History Breast cancer 1st degree relative <50 MGM: colon cancer  Social  History: Reviewed history from 09/03/2009 and no changes required. Occupation: Retired Divorced Current Smoker: >25 pacjk year histoy..currently smokes 1/2 pack per day Alcohol use-no Drug use-no Regular exercise-yes  Review of Systems       no night sweats.  General:  Denies fatigue and fever. CV:  Denies chest pain or discomfort. Resp:  Denies shortness of breath. GI:  Denies abdominal pain. GU:  Denies dysuria.  Physical Exam  General:  Well-developed,well-nourished,in no acute distress; alert,appropriate and cooperative throughout examination Nose:  External nasal examination shows no deformity or inflammation. Nasal mucosa are pink and moist without lesions or exudates. Mouth:  Oral mucosa and oropharynx without lesions or exudates.  Teeth in good repair. Neck:  no carotid bruit or thyromegaly no cervical or supraclavicular lymphadenopathy  Lungs:  Normal respiratory effort, chest expands symmetrically. Lungs are clear to auscultation, no crackles or wheezes. Heart:  Normal rate and regular rhythm. S1 and S2 normal without gallop, murmur, click, rub or other extra sounds. Abdomen:  Bowel sounds positive,abdomen soft and non-tender without masses, organomegaly or hernias noted. Pulses:  R and L posterior tibial pulses are full and equal bilaterally  Extremities:  no edema   Diabetes Management Exam:    Foot Exam (with socks and/or shoes not present):       Sensory-Pinprick/Light touch:          Left medial foot (L-4): normal          Left dorsal foot (L-5): normal          Left lateral foot (S-1): normal          Right medial foot (L-4): normal          Right dorsal foot (L-5): normal          Right lateral foot (S-1): normal       Sensory-Monofilament:          Left foot: normal          Right foot: normal       Inspection:          Left foot: normal          Right foot: normal       Nails:          Left foot: normal          Right foot: normal   Impression &  Recommendations:  Problem # 1:  DIABETES MELLITUS, TYPE II (ICD-250.00) Well controlled with lifestyle change.  Her updated medication list for this problem includes:    Aspir-low 81 Mg Tbec (Aspirin) ..... Once daily    Lisinopril 5 Mg Tabs (Lisinopril) .Marland Kitchen... 1/2 tab by mouth daily  Problem # 2:  HYPERCHOLESTEROLEMIA (ICD-272.0) Inadequate control, but improved. Start red yeast rice, increase fish oil, increase exercsie. Refuses prescription med.  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 to avoid low BPs and to still protect kidney function.   Complete Medication List: 1)  Fish Oil 1000 Mg Caps (Omega-3 fatty acids) .... When she remembers 2)  Multivitamins Caps (Multiple vitamin) .... Once daily 3)  Aspir-low 81 Mg Tbec (Aspirin) .... Once daily 4)  Calcium-vitamin D 600-200 Mg-unit Tabs (Calcium-vitamin d) .Marland Kitchen.. 1 tab by mouth two times a day 5)  Lisinopril 5 Mg Tabs (Lisinopril) .... 1/2 tab by mouth daily  Other Orders: Flu Vaccine 41yrs + MEDICARE PATIENTS (Z6109) Administration Flu vaccine - MCR (G0008)  Lipid Assessment/Plan:      Based on NCEP/ATP III, the patient's risk factor category is "history of diabetes".  The patient's lipid goals are as follows: Total cholesterol goal is 200; LDL cholesterol goal is 160; HDL cholesterol goal is 40; Triglyceride goal is 150.  Her LDL cholesterol goal has not been met.  Secondary causes for hyperlipidemia have been ruled out.  She has been counseled on adjunctive measures for lowering her cholesterol and has  been provided with dietary instructions.    Patient Instructions: 1)  Decrease to 1/2 tab of lisinopril daily. 2)  Continue fish oil daily...consider increasing to 3000-4000 mg daily 3)  Red yeast rice 2400 mg divided daily. 4)  Increase fiber in diet. 5)   increase exercise. 6)  Please schedule a follow-up appointment in 3 months .  7)  HgBA1c prior to visit  ICD-9:  8)  Lipid panel prior to visit ICD-9 : 250.00  Current Allergies (reviewed today): ! CODEINE                 Flu Vaccine Consent Questions     Do you have a history of severe allergic reactions to this vaccine? no    Any prior history of allergic reactions to egg and/or gelatin? no    Do you have a sensitivity to the preservative Thimersol? no    Do you have a past history of Guillan-Barre Syndrome? no    Do you currently have an acute febrile illness? no    Have you ever had a severe reaction to latex? no    Vaccine information given and explained to patient? yes    Are you currently pregnant? no    Lot Number:[id number]   Exp Date:10/11/2010   Manufacturer: [employer]    Site Given  Left Deltoid [employer]

## 2010-05-13 NOTE — Assessment & Plan Note (Signed)
Summary: 30 min appt to discuss lab results   Vital Signs:  Patient profile:   65 year old female Height:      61 inches Weight:      126.6 pounds BMI:     24.01 Temp:     98.7 degrees F oral Pulse rate:   76 / minute Pulse rhythm:   regular BP sitting:   120 / 78  (left arm) Cuff size:   regular  Vitals Entered By: Benny Lennert CMA Duncan Dull) (September 26, 2009 11:50 AM)   History of Present Illness: Chief complaint discuss labs  Recent labs show elevated blood sugar and new diagnosis of diabetes.    Lipid Management History:      Positive NCEP/ATP III risk factors include female age 38 years old or older, diabetes, and current tobacco user.  Negative NCEP/ATP III risk factors include HDL cholesterol greater than 60.      Preventive Screening-Counseling & Management  Alcohol-Tobacco     Alcohol drinks/day: 0     Smoking Status: current     Smoking Cessation Counseling: yes     Smoke Cessation Stage: precontemplative     Packs/Day: 0.5     Pack years: >25      Tobacco Counseling: to quit use of tobacco products  Caffeine-Diet-Exercise     Diet Counseling: to improve diet; diet is suboptimal     Nutrition Referrals: offered, pt refused     Does Patient Exercise: yes     Type of exercise: walking     Times/week: 3     Exercise Counseling: to improve exercise regimen  Problems Prior to Update: 1)  Family History of Other Condition  (ICD-V19.8) 2)  Subacute Delirium  (ICD-293.1) 3)  Physical Examination  (ICD-V70.0) 4)  Routine Gynecological Examination  (ICD-V72.31) 5)  Special Screening For Malignant Neoplasms Colon  (ICD-V76.51) 6)  Special Screening For Osteoporosis  (ICD-V82.81) 7)  Other Screening Mammogram  (ICD-V76.12) 8)  Tobacco Abuse  (ICD-305.1) 9)  Family History Breast Cancer 1st Degree Relative <50  (ICD-V16.3) 10)  Allergic Rhinitis  (ICD-477.9) 11)  Family History Diabetes 1st Degree Relative  (ICD-V18.0) 12)  Family History of Colon Ca 1st Degree  Relative <60  (ICD-V16.0) 13)  Hypercholesterolemia  (ICD-272.0) 14)  Chickenpox, Hx of  (ICD-V15.9) 15)  Arthritis  (ICD-716.90)  Current Medications (verified): 1)  Fish Oil 1000 Mg Caps (Omega-3 Fatty Acids) .... When She Remembers 2)  Multivitamins  Caps (Multiple Vitamin) .... Once Daily 3)  Aspir-Low 81 Mg Tbec (Aspirin) .... Once Daily 4)  Calcium-Vitamin D 600-200 Mg-Unit Tabs (Calcium-Vitamin D) .Marland Kitchen.. 1 Tab By Mouth Two Times A Day  Allergies (verified): No Known Drug Allergies  Past History:  Past medical, surgical, family and social histories (including risk factors) reviewed, and no changes noted (except as noted below).  Past Medical History: Reviewed history from 09/03/2009 and no changes required. Current Problems:  HYPERCHOLESTEROLEMIA (ICD-272.0) CHICKENPOX, HX OF (ICD-V15.9) ARTHRITIS (ICD-716.90)   Allergic rhinitis  Past Surgical History: Reviewed history from 09/03/2009 and no changes required. bartholyn gland removal 1981 left broken ankle  2008 cataract 1978 benign breast cyst biopsy  Family History: Reviewed history from 09/03/2009 and no changes required. Family History of Arthritis Family History of Colon CA 1st degree relative <60 Family History Diabetes 1st degree relative Family History of Stroke F 1st degree relative <60 Family History of Stroke M 1st degree relative <50 Family History of Cardiovascular disorder father: died age 71 MVA mother: DM,  old age sister: breast cancer age 44  3 brother: healthy Family History Breast cancer 1st degree relative <50 MGM: colon cancer  Social History: Reviewed history from 09/03/2009 and no changes required. Occupation: Retired Divorced Current Smoker: >25 pacjk year histoy..currently smokes 1/2 pack per day Alcohol use-no Drug use-no Regular exercise-yes Packs/Day:  0.5  Review of Systems General:  Denies fatigue and fever. CV:  Denies chest pain or discomfort. Resp:  Denies shortness  of breath. GI:  Denies abdominal pain. GU:  Denies dysuria.  Physical Exam  General:  Well-developed,well-nourished,in no acute distress; alert,appropriate and cooperative throughout examination Mouth:  Oral mucosa and oropharynx without lesions or exudates.  Teeth in good repair. Neck:  no carotid bruit or thyromegaly no cervical or supraclavicular lymphadenopathy  Lungs:  Normal respiratory effort, chest expands symmetrically. Lungs are clear to auscultation, no crackles or wheezes. Heart:  Normal rate and regular rhythm. S1 and S2 normal without gallop, murmur, click, rub or other extra sounds. Abdomen:  Bowel sounds positive,abdomen soft and non-tender without masses, organomegaly or hernias noted. Pulses:  R and L posterior tibial pulses are full and equal bilaterally  Extremities:  no edema  Skin:  Intact without suspicious lesions or rashes  Diabetes Management Exam:    Foot Exam (with socks and/or shoes not present):       Sensory-Pinprick/Light touch:          Left medial foot (L-4): normal          Left dorsal foot (L-5): normal          Left lateral foot (S-1): normal          Right medial foot (L-4): normal          Right dorsal foot (L-5): normal          Right lateral foot (S-1): normal       Sensory-Monofilament:          Left foot: normal          Right foot: normal       Inspection:          Left foot: normal          Right foot: normal       Nails:          Left foot: normal          Right foot: normal   Impression & Recommendations:  Problem # 1:  DIABETES MELLITUS, TYPE II (ICD-250.00) Total visit time > 50% spent counseling and cordinating patients care.  Discussed Dm disease, complications and associated risk factors.  Info given on lifestyle change. Will evaluate control of DM with A1C and will eval for microalbuminuria.  Refused nutrition counsleing. Recommended getting a meter...but if well controlled checking as needed o one tinme  daily.  Her updated medication list for this problem includes:    Aspir-low 81 Mg Tbec (Aspirin) ..... Once daily  Orders: TLB-A1C / Hgb A1C (Glycohemoglobin) (83036-A1C) TLB-Microalbumin/Creat Ratio, Urine (82043-MALB)  Problem # 2:  HYPERCHOLESTEROLEMIA (ICD-272.0)  Very poor control. Info given on diet chnage. Recommended aggressive lifestyle cahnge..if no improvement in 3 months start medicaiton. Start fish oil 2000 mg divided daily for CAD prevention and hypertriglycerides.   Labs Reviewed: SGOT: 20 (09/17/2009)   SGPT: 16 (09/17/2009)  Lipid Goals: Chol Goal: 200 (09/03/2009)   HDL Goal: 40 (09/03/2009)   LDL Goal: 160 (09/03/2009)   TG Goal: 150 (09/03/2009)  Prior 10 Yr Risk Heart Disease: Not enough  information (09/03/2009)   HDL:64.20 (09/17/2009)  Chol:326 (09/17/2009)  Trig:271.0 (09/17/2009)  Complete Medication List: 1)  Fish Oil 1000 Mg Caps (Omega-3 fatty acids) .... When she remembers 2)  Multivitamins Caps (Multiple vitamin) .... Once daily 3)  Aspir-low 81 Mg Tbec (Aspirin) .... Once daily 4)  Calcium-vitamin D 600-200 Mg-unit Tabs (Calcium-vitamin d) .Marland Kitchen.. 1 tab by mouth two times a day  Lipid Assessment/Plan:      Based on NCEP/ATP III, the patient's risk factor category is "history of diabetes".  The patient's lipid goals are as follows: Total cholesterol goal is 200; LDL cholesterol goal is 160; HDL cholesterol goal is 40; Triglyceride goal is 150.    Patient Instructions: 1)  Start regular exercsie program.. walking 3-5 times a wekk...20-30 min. 2)  Stop orange...replace with higher fiber fruit. 3)  Limit carbs and concentrated sweets. 4)   Call if interested in diabetes nutritionist. 5)   Decrease cholesterol and fatty foods in diet.  6)  Stop smoking, call if interested in medication. 7)  Increase Fish oil 2000-4000mg  8)  Please schedule a follow-up appointment in 3 months .  9)  BMP prior to visit, ICD-9: 250.00, 272.0 10)  Hepatic Panel prior to  visit ICD-9:  11)  Lipid panel prior to visit ICD-9 :  12)  HgBA1c prior to visit  ICD-9:   Current Allergies (reviewed today): No known allergies   Prevention & Chronic Care Immunizations   Influenza vaccine: given  (01/11/2009)   Influenza vaccine due: 12/12/2009    Tetanus booster: 09/03/2009: Td   Td booster deferral: Not indicated  (09/26/2009)   Tetanus booster due: 04/14/2007    Pneumococcal vaccine: Pneumovax  (09/03/2009)   Pneumococcal vaccine deferral: Not indicated  (09/26/2009)   Pneumococcal vaccine due: None    H. zoster vaccine: Not documented  Colorectal Screening   Hemoccult: Not documented   Hemoccult due: Not Indicated    Colonoscopy: Not documented  Other Screening   Pap smear: NEGATIVE FOR INTRAEPITHELIAL LESIONS OR MALIGNANCY.  (09/03/2009)   Pap smear due: 09/04/2010    Mammogram: normal  (04/13/2004)   Mammogram due: 04/13/2005    DXA bone density scan: Not documented   Smoking status: current  (09/26/2009)   Smoking cessation counseling: yes  (09/26/2009)  Diabetes Mellitus   HgbA1C: Not documented    Eye exam: Not documented    Foot exam: yes  (09/26/2009)   High risk foot: Not documented   Foot care education: Not documented    Urine microalbumin/creatinine ratio: Not documented  Lipids   Total Cholesterol: 326  (09/17/2009)   LDL: Not documented   LDL Direct: 213.5  (09/17/2009)   HDL: 64.20  (09/17/2009)   Triglycerides: 271.0  (09/17/2009)    SGOT (AST): 20  (09/17/2009)   SGPT (ALT): 16  (09/17/2009)   Alkaline phosphatase: 88  (09/17/2009)   Total bilirubin: 0.8  (09/17/2009)  Self-Management Support :    Diabetes self-management support: Not documented    Lipid self-management support: Not documented

## 2010-05-13 NOTE — Letter (Signed)
Summary: Results Follow up Letter  [facility]  [address] [place], [place] [address]   Phone: [phone]  Fax: [phone]    09/13/2009 MRN: [id number]     Katelyn Kennedy [address] [place], [place]  [address]    Dear Ms. Deroy,  The following are the results of your recent test(s):  Test         Result    Pap Smear:        Normal __x___  Not Normal _____ Comments:Repeat in 1 year ______________________________________________________ Cholesterol: LDL(Bad cholesterol):         Your goal is less than:         HDL (Good cholesterol):       Your goal is more than: Comments:  ______________________________________________________ Mammogram:        Normal _____  Not Normal _____ Comments:  ___________________________________________________________________ Hemoccult:        Normal _____  Not normal _______ Comments:    _____________________________________________________________________ Other Tests:    We routinely do not discuss normal results over the telephone.  If you desire a copy of the results, or you have any questions about this information we can discuss them at your next office visit.   Sincerely,  Kerby Nora MD

## 2010-05-15 NOTE — Assessment & Plan Note (Signed)
Summary: 3 month follow up/rbh   Vital Signs:  Patient profile:   65 year old female Height:      61 inches Weight:      112.25 pounds BMI:     21.29 Temp:     98.7 degrees F oral Pulse rate:   68 / minute Pulse rhythm:   regular BP sitting:   120 / 72  (left arm) Cuff size:   regular  Vitals Entered By: Benny Lennert CMA Duncan Dull) (March 25, 2010 2:20 PM)  History of Present Illness: Chief complaint 3 month follow up   DM, well controlled: With diet and lifestyle   BP at goal on lisinopril <130/80. Lisinopril started for microalbuminuria. Some dizzyness and low BPs intermittantly with this med.   Has made change in diet to be healthier..has lost 18 lbs total. Watching fat and sugar, smaller portions.  Somewhat fster than she thought she would loose, but no additional symptoms, feels well. no night sweats, fever. Eating three meals a day.  Triglycerides now at goal  with fish oil...2400 mg fish oil. Poor control cholesterol: Wiushes to contoinue lifestlye treatment for LDL...goal <100.   Nausea intermittant x 6 month ...mild..usually occurs after waking up from a nap occ after a meal. Last 10 minutes.   No emesis. No heartburn, but lasted seconds intermittantly for 10-15  minutes of  pressure in left central chest few weeks ago, one episode.  No SOB. Was up doing things but not exerting self.   Lipid Management History:      Positive NCEP/ATP III risk factors include female age 32 years old or older, diabetes, and current tobacco user.        Her compliance with the TLC diet is fair.     Problems Prior to Update: 1)  Nausea  (ICD-787.02) 2)  Orthostatic Dizziness  (ICD-780.4) 3)  Osteopenia  (ICD-733.90) 4)  Diabetes Mellitus, Type II  (ICD-250.00) 5)  Physical Examination  (ICD-V70.0) 6)  Routine Gynecological Examination  (ICD-V72.31) 7)  Special Screening For Malignant Neoplasms Colon  (ICD-V76.51) 8)  Special Screening For Osteoporosis  (ICD-V82.81) 9)   Other Screening Mammogram  (ICD-V76.12) 10)  Tobacco Abuse  (ICD-305.1) 11)  Family History Breast Cancer 1st Degree Relative <50  (ICD-V16.3) 12)  Allergic Rhinitis  (ICD-477.9) 13)  Family History Diabetes 1st Degree Relative  (ICD-V18.0) 14)  Family History of Colon Ca 1st Degree Relative <60  (ICD-V16.0) 15)  Hypercholesterolemia  (ICD-272.0) 16)  Chickenpox, Hx of  (ICD-V15.9) 17)  Arthritis  (ICD-716.90)  Current Medications (verified): 1)  Fish Oil 1000 Mg Caps (Omega-3 Fatty Acids) .... When She Remembers 2)  Multivitamins  Caps (Multiple Vitamin) .... Once Daily 3)  Aspir-Low 81 Mg Tbec (Aspirin) .... Once Daily 4)  Calcium-Vitamin D 600-200 Mg-Unit Tabs (Calcium-Vitamin D) .Marland Kitchen.. 1 Tab By Mouth Two Times A Day 5)  Lisinopril 5 Mg Tabs (Lisinopril) .... 1/2 Tab By Mouth Daily 6)  Lipitor 20 Mg Tabs (Atorvastatin Calcium) .Marland Kitchen.. 1 Tab By Mouth Daily  Allergies: 1)  ! Codeine  Past History:  Past medical, surgical, family and social histories (including risk factors) reviewed, and no changes noted (except as noted below).  Past Medical History: Reviewed history from 09/03/2009 and no changes required. Current Problems:  HYPERCHOLESTEROLEMIA (ICD-272.0) CHICKENPOX, HX OF (ICD-V15.9) ARTHRITIS (ICD-716.90)   Allergic rhinitis  Past Surgical History: Reviewed history from 09/03/2009 and no changes required. bartholyn gland removal 1981 left broken ankle  2008 cataract 1978 benign breast cyst biopsy  Family History: Reviewed history from 09/03/2009 and no changes required. Family History of Arthritis Family History of Colon CA 1st degree relative <60 Family History Diabetes 1st degree relative Family History of Stroke F 1st degree relative <60 Family History of Stroke M 1st degree relative <50 Family History of Cardiovascular disorder father: died age 60 MVA mother: DM, old age sister: breast cancer age 91  3 brother: healthy Family History Breast cancer 1st degree  relative <50 MGM: colon cancer  Social History: Reviewed history from 09/03/2009 and no changes required. Occupation: Retired Divorced Current Smoker: >25 pacjk year histoy..currently smokes 1/2 pack per day Alcohol use-no Drug use-no Regular exercise-yes  Review of Systems General:  Denies fatigue and fever. CV:  Complains of chest pain or discomfort. Resp:  Denies shortness of breath. GI:  Denies abdominal pain. GU:  Denies dysuria.  Physical Exam  General:  Well-developed,well-nourished,in no acute distress; alert,appropriate and cooperative throughout examination Eyes:  No corneal or conjunctival inflammation noted. EOMI. Perrla. Funduscopic exam benign, without hemorrhages, exudates or papilledema. Vision grossly normal. Ears:  External ear exam shows no significant lesions or deformities.  Otoscopic examination reveals clear canals, tympanic membranes are intact bilaterally without bulging, retraction, inflammation or discharge. Hearing is grossly normal bilaterally. Nose:  External nasal examination shows no deformity or inflammation. Nasal mucosa are pink and moist without lesions or exudates. Mouth:  Oral mucosa and oropharynx without lesions or exudates.  Teeth in good repair. Neck:  no carotid bruit or thyromegaly no cervical or supraclavicular lymphadenopathy  Lungs:  Normal respiratory effort, chest expands symmetrically. Lungs are clear to auscultation, no crackles or wheezes. Heart:  Normal rate and regular rhythm. S1 and S2 normal without gallop, murmur, click, rub or other extra sounds. Abdomen:  Bowel sounds positive,abdomen soft and non-tender without masses, organomegaly or hernias noted. Pulses:  R and L posterior tibial pulses are full and equal bilaterally  Extremities:  no edema Skin:  Intact without suspicious lesions or rashes Psych:  Cognition and judgment appear intact. Alert and cooperative with normal attention span and concentration. No apparent  delusions, illusions, hallucinations  Diabetes Management Exam:    Foot Exam (with socks and/or shoes not present):       Sensory-Pinprick/Light touch:          Left medial foot (L-4): normal          Left dorsal foot (L-5): normal          Left lateral foot (S-1): normal          Right medial foot (L-4): normal          Right dorsal foot (L-5): normal          Right lateral foot (S-1): normal       Sensory-Monofilament:          Left foot: normal          Right foot: normal       Inspection:          Left foot: normal          Right foot: normal       Nails:          Left foot: normal          Right foot: normal   Impression & Recommendations:  Problem # 1:  CHEST PAIN, ACUTE (ICD-786.50) Atypical pain.  EKG.. shows non specific cchanges.. but pt high risk.. DM, HTN, high cholesterol and smoker.... refer for Cardiolyte to eval further.  Orders: EKG w/ Interpretation (93000) Misc. Referral (Misc. Ref)  Problem # 2:  NAUSEA (ICD-787.02) ? secondary to mild GERd. Do not nap aftrer meals. Avoid GER triggers.   Problem # 3:  DIABETES MELLITUS, TYPE II (ICD-250.00) Well controlled with lifestyle.  Her updated medication list for this problem includes:    Aspir-low 81 Mg Tbec (Aspirin) ..... Once daily    Lisinopril 5 Mg Tabs (Lisinopril) .Marland Kitchen... 1/2 tab by mouth daily  Problem # 4:  HYPERCHOLESTEROLEMIA (ICD-272.0) Add lipitor to get LDL to goal <100. Recheck in 3 months.  Her updated medication list for this problem includes:    Lipitor 20 Mg Tabs (Atorvastatin calcium) .Marland Kitchen... 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 I do not believe this is causing nausea.   Complete Medication List: 1)  Fish Oil 1000 Mg Caps (Omega-3 fatty acids) .... When she remembers 2)  Multivitamins Caps (Multiple vitamin) .... Once daily 3)  Aspir-low 81 Mg Tbec (Aspirin) .... Once daily 4)  Calcium-vitamin D 600-200 Mg-unit Tabs (Calcium-vitamin d)  .Marland Kitchen.. 1 tab by mouth two times a day 5)  Lisinopril 5 Mg Tabs (Lisinopril) .... 1/2 tab by mouth daily 6)  Lipitor 20 Mg Tabs (Atorvastatin calcium) .Marland Kitchen.. 1 tab by mouth daily  Lipid Assessment/Plan:      Based on NCEP/ATP III, the patient's risk factor category is "history of diabetes".  The patient's lipid goals are as follows: Total cholesterol goal is 200; LDL cholesterol goal is 100; HDL cholesterol goal is 40; Triglyceride goal is 150.  Her LDL cholesterol goal has not been met.  Secondary causes for hyperlipidemia have been ruled out.  She has been counseled on adjunctive measures for lowering her cholesterol and has been provided with dietary instructions.    Patient Instructions: 1)  Avoid napping close to meals to avoid nausea. 2)   Start lipitor 20 mg daily. Stop red yeast rice.  3)   Continue fish oil. 4)  Please schedule a follow-up appointment in 3 months .  5)  BMP prior to visit, ICD-9: 250.00 6)  Lipid panel prior to visit ICD-9 :  7)  HgBA1c prior to visit  ICD-9:  8)  Referral Appointment Information 9)  Day/Date: 10)  Time: 11)  Place/MD: 12)  Address: 13)  Phone/Fax: 14)  Patient given appointment information. Information/Orders faxed/mailed.  Prescriptions: LIPITOR 20 MG TABS (ATORVASTATIN CALCIUM) 1 tab by mouth daily  #30 x 11   Entered and Authorized by:   Kerby Nora MD   Signed by:   Kerby Nora MD on 03/25/2010   Method used:   [facility] to        [facility]  [address] [id number]       [address]       [place], [place]  [address]       Ph: [phone] or [phone]       Fax: [phone]   RxID:   [id number]    Orders Added: 1)  EKG w/ Interpretation [93000] 2)  Est. Patient Level IV [36644] 3)  Misc. Referral [Misc. Ref]    Current Allergies (reviewed today): ! CODEINE

## 2010-05-17 ENCOUNTER — Encounter: Payer: Self-pay | Admitting: Family Medicine

## 2010-05-20 ENCOUNTER — Encounter: Payer: Self-pay | Admitting: Family Medicine

## 2010-05-20 ENCOUNTER — Ambulatory Visit (INDEPENDENT_AMBULATORY_CARE_PROVIDER_SITE_OTHER): Payer: MEDICARE | Admitting: Family Medicine

## 2010-05-20 DIAGNOSIS — L723 Sebaceous cyst: Secondary | ICD-10-CM | POA: Insufficient documentation

## 2010-05-21 ENCOUNTER — Telehealth: Payer: Self-pay | Admitting: Family Medicine

## 2010-05-21 ENCOUNTER — Encounter: Payer: Self-pay | Admitting: Family Medicine

## 2010-05-21 ENCOUNTER — Ambulatory Visit: Payer: Self-pay | Admitting: Family Medicine

## 2010-05-22 ENCOUNTER — Ambulatory Visit: Payer: Self-pay | Admitting: Family Medicine

## 2010-05-27 ENCOUNTER — Encounter: Payer: Self-pay | Admitting: Family Medicine

## 2010-05-27 ENCOUNTER — Ambulatory Visit (INDEPENDENT_AMBULATORY_CARE_PROVIDER_SITE_OTHER): Payer: MEDICARE | Admitting: Family Medicine

## 2010-05-27 DIAGNOSIS — L723 Sebaceous cyst: Secondary | ICD-10-CM

## 2010-05-29 NOTE — Progress Notes (Signed)
Summary: pulled out packing  Phone Note Call from Patient Call back at Home Phone [phone]   Caller: Patient Summary of Call: Pt was seen yesterday for a cyst.  She accidentally pulled about 4 inches of the packing out today.  She redressed wound  but is asking if she needs to come in to have it repacked.  Please advise.  She is not due to come back in until tuesday. Initial call taken by: Lowella Petties CMA, AAMA,  May 21, 2010 1:13 PM  Follow-up for Phone Call        as long as enough packing to keep wound open should be ok.  could always come in tomorrow if concerned. Follow-up by: Eustaquio Boyden  MD,  May 21, 2010 1:50 PM  Additional Follow-up for Phone Call Additional follow up Details #1::        Advised pt, she will call back if problems. Additional Follow-up by: Lowella Petties CMA, AAMA,  May 21, 2010 2:40 PM

## 2010-05-29 NOTE — Assessment & Plan Note (Signed)
Summary: INFECTION ON SHOULDER   Vital Signs:  Patient profile:   65 year old female Weight:      113.50 pounds Temp:     98.5 degrees F oral Pulse rate:   68 / minute Pulse rhythm:   regular BP sitting:   110 / 80  (left arm) Cuff size:   regular  Vitals Entered By: Selena Batten Dance CMA (AAMA) (May 20, 2010 11:00 AM) CC: Abscess on right shoulder   History of Present Illness: CC: ? infection R shoulder  started as cyst on arm.  has had for 20 year.  started getting red/tender last week.  especially bothersome at night because sleeps on side.  Has been using warm compresses on it without helping.  No recent abx use.  No f/c/n/v/d, SOB.    h/o cysts in past.  smoking 1/2 ppd.  contemplative about quitting.  Current Medications (verified): 1)  Fish Oil 1000 Mg Caps (Omega-3 Fatty Acids) .... When She Remembers 2)  Multivitamins  Caps (Multiple Vitamin) .... Once Daily 3)  Aspir-Low 81 Mg Tbec (Aspirin) .... Once Daily 4)  Calcium-Vitamin D 600-200 Mg-Unit Tabs (Calcium-Vitamin D) .Marland Kitchen.. 1 Tab By Mouth Two Times A Day 5)  Lisinopril 5 Mg Tabs (Lisinopril) .... 1/2 Tab By Mouth Daily 6)  Lipitor 20 Mg Tabs (Atorvastatin Calcium) .Marland Kitchen.. 1 Tab By Mouth Daily  Allergies: 1)  ! Codeine  Past History:  Past Medical History: Last updated: 09/03/2009 Current Problems:  HYPERCHOLESTEROLEMIA (ICD-272.0) CHICKENPOX, HX OF (ICD-V15.9) ARTHRITIS (ICD-716.90)   Allergic rhinitis  Social History: Last updated: 09/03/2009 Occupation: Retired Divorced Current Smoker: >25 pacjk year histoy..currently smokes 1/2 pack per day Alcohol use-no Drug use-no Regular exercise-yes  Review of Systems       per HPI  Physical Exam  General:  Well-developed,well-nourished,in no acute distress; alert,appropriate and cooperative throughout examination Skin:  R posteriolateral upper arm with infected epidermoid cyst, + fluctuance and some erythema at cyst.   Axillary Nodes:  No palpable  lymphadenopathy   Impression & Recommendations:  Problem # 1:  EPIDERMOID CYST (ICD-706.2) I&D performed.  seemed more like inflammed epidermoid cyst than infected.  sent culture regardless.  pt tolerated well.  treat with abx as well given location near axilla.  Orders: I&D Abscess, Complex (10061) Specimen Handling (16109) T-Culture, Wound ([id number])  Complete Medication List: 1)  Fish Oil 1000 Mg Caps (Omega-3 fatty acids) .... When she remembers 2)  Multivitamins Caps (Multiple vitamin) .... Once daily 3)  Aspir-low 81 Mg Tbec (Aspirin) .... Once daily 4)  Calcium-vitamin D 600-200 Mg-unit Tabs (Calcium-vitamin d) .Marland Kitchen.. 1 tab by mouth two times a day 5)  Lisinopril 5 Mg Tabs (Lisinopril) .... 1/2 tab by mouth daily 6)  Lipitor 20 Mg Tabs (Atorvastatin calcium) .Marland Kitchen.. 1 tab by mouth daily 7)  Augmentin 500-125 Mg Tabs (Amoxicillin-pot clavulanate) .... Take one by mouth two times a day x 10 days  Patient Instructions: 1)  I&D performed today. 2)  Leave covered for 24 hours then Slowly pull out gauze packing 1/4 inch at a time daily until falls out. 3)  antibiotic twice daily for 10 days. 4)  Return next week for recheck. 5)  things to watch out for - developing fevers, worsening redness or pus draining or worsening pain. 6)  Call clinic with quesitons Prescriptions: AUGMENTIN 500-125 MG TABS (AMOXICILLIN-POT CLAVULANATE) take one by mouth two times a day x 10 days  #20 x 0   Entered and Authorized by:  Eustaquio Boyden  MD   Signed by:   Eustaquio Boyden  MD on 05/20/2010   Method used:   [facility]  [id number]       [address]       [place], [place]  [address]       Ph: [phone] or [phone]       Fax: [phone]   RxID:   [id number]    Orders Added: 1)  Est. Patient Level III [25956] 2)  I&D Abscess, Complex [10061] 3)  Specimen Handling [99000] 4)  T-Culture, Wound  [[id number]]      Procedure Note Last Tetanus: Td (09/03/2009)  Incision & Drainage: The patient complains of pain, redness, irritation, inflammation, tenderness, and swelling. Date of onset: 05/13/2010 Indication: infected lesion Consent signed: yes  Procedure # 1: I & D with packing    Size (in cm): 1.5 x 1.5    Region: posteriolateral    Location: R shoulder    Comment: incision along skin tension line    Instrument used: #11 blade    Anesthesia: 1% lidocaine w/epinephrine  Cleaned and prepped with: betadine Wound dressing: pressure dressing Instructions: RTC in 5 days Additional Instructions: pt tolerated procedure well.  minimal blood loss.  Current Allergies (reviewed today): ! CODEINE

## 2010-05-30 ENCOUNTER — Encounter: Payer: Self-pay | Admitting: Family Medicine

## 2010-05-30 ENCOUNTER — Ambulatory Visit (INDEPENDENT_AMBULATORY_CARE_PROVIDER_SITE_OTHER): Payer: MEDICARE | Admitting: Family Medicine

## 2010-05-30 DIAGNOSIS — S20219A Contusion of unspecified front wall of thorax, initial encounter: Secondary | ICD-10-CM

## 2010-05-30 DIAGNOSIS — L723 Sebaceous cyst: Secondary | ICD-10-CM

## 2010-06-04 NOTE — Assessment & Plan Note (Signed)
Summary: 1 WEEK FOLLOW UP RECHECK/RBH   Vital Signs:  Patient profile:   65 year old female Weight:      113 pounds Temp:     99.3 degrees F oral Pulse rate:   76 / minute Pulse rhythm:   regular BP sitting:   122 / 78  (left arm) Cuff size:   regular  Vitals Entered By: Selena Batten Dance CMA (AAMA) (May 27, 2010 11:09 AM) CC: 1 week follow up   History of Present Illness: CC: 1 wk f/u  seen last week with inflammed epidermal cyst s/p I&D.  Still taking augmentin for 2 more days given location.  wound culture with no growth.  Still some packing present.  has not taken any out at all because of concern for falling out prematurely.  No redness, still draining some.   No fevers/chills, pus drainage.   Allergies: 1)  ! Codeine  Physical Exam  General:  Well-developed,well-nourished,in no acute distress; alert,appropriate and cooperative throughout examination Skin:  R posteriolateral upper arm with wound with dry gauze packing.  packing removed completely, cleaned with saline, and about 2 inches of new sterile gauze packed in.  pt tolerated well.   Impression & Recommendations:  Problem # 1:  EPIDERMOID CYST (ICD-706.2)  finish abx course slowly remove packing this time.  return friday for what I anticipate to be last wound check.  Orders: No Charge Patient Arrived (NCPA0) (NCPA0)  Complete Medication List: 1)  Fish Oil 1000 Mg Caps (Omega-3 fatty acids) .... When she remembers 2)  Multivitamins Caps (Multiple vitamin) .... Once daily 3)  Aspir-low 81 Mg Tbec (Aspirin) .... Once daily 4)  Calcium-vitamin D 600-200 Mg-unit Tabs (Calcium-vitamin d) .Marland Kitchen.. 1 tab by mouth two times a day 5)  Lisinopril 5 Mg Tabs (Lisinopril) .... 1/2 tab by mouth daily 6)  Lipitor 20 Mg Tabs (Atorvastatin calcium) .Marland Kitchen.. 1 tab by mouth daily 7)  Augmentin 500-125 Mg Tabs (Amoxicillin-pot clavulanate) .... Take one by mouth two times a day x 10 days  Patient Instructions: 1)  looking good. 2)   finish antibiotics. 3)  keep intact until wednesday night, then pull out 1/2 way.  pull out completely next day,  Return friday for recheck. 4)  call clinic with quesitons.   Orders Added: 1)  No Charge Patient Arrived (NCPA0) [NCPA0]    Current Allergies (reviewed today): ! CODEINE

## 2010-06-04 NOTE — Miscellaneous (Signed)
Summary: Consent to Special Procedure-I&D  Consent to Special Procedure-I&D   Imported By: Maryln Gottron 05/27/2010 15:28:53  _____________________________________________________________________  External Attachment:    Type:   Image     Comment:   External Document
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
## 2010-06-25 ENCOUNTER — Other Ambulatory Visit (INDEPENDENT_AMBULATORY_CARE_PROVIDER_SITE_OTHER): Payer: MEDICARE

## 2010-06-25 ENCOUNTER — Other Ambulatory Visit: Payer: Self-pay | Admitting: Family Medicine

## 2010-06-25 ENCOUNTER — Encounter: Payer: Self-pay | Admitting: *Deleted

## 2010-06-25 DIAGNOSIS — E7800 Pure hypercholesterolemia, unspecified: Secondary | ICD-10-CM

## 2010-06-25 DIAGNOSIS — E119 Type 2 diabetes mellitus without complications: Secondary | ICD-10-CM

## 2010-06-25 LAB — BASIC METABOLIC PANEL
BUN: 18 mg/dL (ref 6–23)
CO2: 27 mEq/L (ref 19–32)
Calcium: 9.7 mg/dL (ref 8.4–10.5)
Chloride: 104 mEq/L (ref 96–112)
Creatinine, Ser: 0.7 mg/dL (ref 0.4–1.2)
GFR: 83.38 mL/min (ref >60.00)
Glucose, Bld: 97 mg/dL (ref 70–99)
Potassium: 4.5 mEq/L (ref 3.5–5.1)
Sodium: 139 mEq/L (ref 135–145)

## 2010-06-25 LAB — LIPID PANEL
Cholesterol: 166 mg/dL (ref 0–200)
HDL: 66 mg/dL (ref >39.00)
LDL Cholesterol: 89 mg/dL (ref 0–99)
Total CHOL/HDL Ratio: 3
Triglycerides: 57 mg/dL (ref 0.0–149.0)
VLDL: 11.4 mg/dL (ref 0.0–40.0)

## 2010-06-25 LAB — HEMOGLOBIN A1C: Hgb A1c MFr Bld: 5.6 % (ref 4.6–6.5)

## 2010-06-29 LAB — GLUCOSE, CAPILLARY
Glucose-Capillary: 103 mg/dL — ABNORMAL HIGH (ref 70–99)
Glucose-Capillary: 135 mg/dL — ABNORMAL HIGH (ref 70–99)

## 2010-07-01 ENCOUNTER — Ambulatory Visit: Payer: Self-pay | Admitting: Family Medicine

## 2010-08-05 ENCOUNTER — Ambulatory Visit (INDEPENDENT_AMBULATORY_CARE_PROVIDER_SITE_OTHER): Payer: Medicare Other | Admitting: Family Medicine

## 2010-08-05 ENCOUNTER — Encounter: Payer: Self-pay | Admitting: Family Medicine

## 2010-08-05 DIAGNOSIS — E119 Type 2 diabetes mellitus without complications: Secondary | ICD-10-CM

## 2010-08-05 DIAGNOSIS — J309 Allergic rhinitis, unspecified: Secondary | ICD-10-CM

## 2010-08-05 DIAGNOSIS — R809 Proteinuria, unspecified: Secondary | ICD-10-CM

## 2010-08-05 DIAGNOSIS — E7800 Pure hypercholesterolemia, unspecified: Secondary | ICD-10-CM

## 2010-08-05 MED ORDER — ATORVASTATIN CALCIUM 20 MG PO TABS: 20.0000 mg | ORAL_TABLET | Freq: Every day | ORAL | Status: DC

## 2010-08-05 MED ORDER — LISINOPRIL 5 MG PO TABS: 2.5000 mg | ORAL_TABLET | Freq: Every day | ORAL | Status: DC

## 2010-08-05 MED ORDER — FLUTICASONE PROPIONATE 50 MCG/ACT NA SUSP: 2.0000 | Freq: Every day | NASAL | Status: DC

## 2010-08-05 NOTE — Assessment & Plan Note (Signed)
Well controlled. Continue lifestyle changes 

## 2010-08-05 NOTE — Assessment & Plan Note (Signed)
Well controlled. Continue current medication.  

## 2010-08-05 NOTE — Progress Notes (Signed)
  Subjective:    Patient ID: Katelyn Kennedy, female    DOB: 09-25-1945, 65 y.o.   MRN: [id number]  HPI  Diabetes:  Well controlled with diet Hypoglycemic episodes: ?  Hyperglycemic episodes:? Feet problems: None Blood Sugars averaging: Does not have a meter. eye exam within last year: No.  Smoker.. 1/2 ppd. Not interested in meds to quit smoking.    Elevated Cholesterol: well controlled on lipitor 20 mg daily and fish oil Using medications without problems: Muscle aches: none Other complaints:   Epidermoid cyst, infected removed from right shoulder by Dr. Reece Agar.  Allergic rhinitis; tried allegra.Marland Kitchen Helped but caused fatigue. Still has some post nasal drip.. Ongoing for years.      Review of Systems  Constitutional: Negative for fever and fatigue.  HENT: Positive for congestion, rhinorrhea, sneezing and postnasal drip. Negative for ear pain.   Respiratory: Negative for shortness of breath.   Cardiovascular: Negative for chest pain, palpitations and leg swelling.  Gastrointestinal: Negative for abdominal pain, diarrhea, constipation and blood in stool.  Genitourinary: Negative for dysuria.       Objective:   Physical Exam  Constitutional: Vital signs are normal. She appears well-developed and well-nourished. She is cooperative.  Non-toxic appearance. She does not appear ill. No distress.  HENT:  Head: Normocephalic.  Right Ear: Hearing, tympanic membrane, external ear and ear canal normal. Tympanic membrane is not erythematous, not retracted and not bulging.  Left Ear: Hearing, tympanic membrane, external ear and ear canal normal. Tympanic membrane is not erythematous, not retracted and not bulging.  Nose: Mucosal edema and rhinorrhea present. Right sinus exhibits no maxillary sinus tenderness and no frontal sinus tenderness. Left sinus exhibits no maxillary sinus tenderness and no frontal sinus tenderness.  Mouth/Throat: Uvula is midline, oropharynx is clear and moist and  mucous membranes are normal.  Eyes: Conjunctivae, EOM and lids are normal. Pupils are equal, round, and reactive to light. No foreign bodies found.  Neck: Trachea normal and normal range of motion. Neck supple. Carotid bruit is not present. No mass and no thyromegaly present.  Cardiovascular: Normal rate, regular rhythm, S1 normal, S2 normal, normal heart sounds, intact distal pulses and normal pulses.  Exam reveals no gallop and no friction rub.   No murmur heard. Pulmonary/Chest: Effort normal and breath sounds normal. Not tachypneic. No respiratory distress. She has no decreased breath sounds. She has no wheezes. She has no rhonchi. She has no rales.  Genitourinary: Vagina normal and uterus normal.  Neurological: She is alert.  Skin: Skin is warm, dry and intact. No rash noted.  Psychiatric: Her speech is normal and behavior is normal. Judgment normal. Her mood appears not anxious. Cognition and memory are normal. She does not exhibit a depressed mood.   Diabetic foot exam: Normal inspection No skin breakdown No calluses  Normal DP pulses Normal sensation to light tough and monofilament Nails normal        Assessment & Plan:

## 2010-08-05 NOTE — Patient Instructions (Signed)
Trial of rhinocort for nasal symptoms and post nasal drip.  Use during allergy season.  Call if not improving.

## 2010-08-05 NOTE — Assessment & Plan Note (Signed)
On low dose ACEI 

## 2010-08-05 NOTE — Assessment & Plan Note (Signed)
Poor control. SE to allegra. Will try rhinocort for local treatment.

## 2010-08-29 NOTE — Op Note (Signed)
NAMEAMIKA, TASSIN               ACCOUNT NO.:  [id number]   MEDICAL RECORD NO.:  [id number]          PATIENT TYPE:  AMB   LOCATION:  DAY                          FACILITY:  [facility]   PHYSICIAN:  Leonides Grills, M.D.     DATE OF BIRTH:  November 13, 1945   DATE OF PROCEDURE:  04/14/2006  DATE OF DISCHARGE:                               OPERATIVE REPORT   PREOPERATIVE DIAGNOSIS:  Closed left bimalleolar ankle fracture.   POSTOPERATIVE DIAGNOSIS:  Closed left bimalleolar ankle fracture.   OPERATION:  1. Open reduction and internal fixation, left bimalleolar ankle      fracture.  2. Stress x-rays left ankle.   ANESTHESIA:  General.   SURGEON:  Leonides Grills, MD.   ASSISTANT:  Evlyn Kanner, PA-C.   ESTIMATED BLOOD LOSS:  Minimal.   TOURNIQUET TIME:  Approximately 40 minutes.   COMPLICATIONS:  None.   DISPOSITION:  Stable to PR.   INDICATIONS:  This is a 65 year old female who sustained the above  injury.  She was consented for the above procedure.  All risks which  include infection, neurovessel injury, nonunion, malunion, hardware  rotation, hardware failure, stiffness, arthritis, possible osteochondral  lesion and future surgery were all explained, questions were encouraged  and answered.   DESCRIPTION OF PROCEDURE:  The patient was brought to the operating room  and placed in supine position. After adequate general endotracheal tube  anesthesia was administered as well as Ancef 1 gram IV piggyback, a bump  was placed on her left ipsilateral hip and the left lower extremity was  then prepped and draped in a sterile manner over a proximally placed  thigh tourniquet. The limb was gravity exsanguinated and tourniquet was  inflated to 290 mmHg. A longitudinal incision over the left lateral  malleolus was then made.  Dissection was carried down directly to bone  with a full-thickness incision, hemostasis was obtained. The fracture  site was encountered,  hematoma was removed from  the fracture site. Two-  point reduction clamps were then used to anatomically reduce the  fracture and a four-hole one-third tubular plate was then applied over  the lateral aspect of the lateral malleolus.  This was contoured to the  anatomy of the lateral malleolus. Two proximal 3.5 mm fully-threaded  cortical set screws were then placed using a 2.5-mm drill hole  respectively. Two distal screws were placed were again fully-threaded  cortical screws, unicortical. We then placed a lag screw across the  fracture site.  This held the fracture in anatomic position.  X-rays  were obtained in AP and lateral planes and this showed an anatomic  reduction.  We then made a longitudinal incision over the medial  malleolus.  Dissection was carried down through the skin, hemostasis was  obtained. The fracture site was then encountered.  Hematoma and  periosteum were removed the fracture site.  There was a small loose  piece on the posterior aspect of the medial malleolus that was removed.  This was a free piece and it appeared that it would be irritating the  posterior tibial tendon and was too small  to have any fixation in this  piece. There was no articular component to this piece as well.  The  fracture was then anatomically reduced with a two-point reduction clamp  and a 3.5-mm fully threaded cortical set screw was then placed using a  2.5-mm drill hole respectively.  This had excellent purchase and  maintenance of the correction.  Final stress x-rays were obtained in AP,  lateral and mortise views and showed an anatomic reduction, no gross  motion across the fixation site or syndesmotic area, fixation in the  proper position as well and anatomic reduction.  The tourniquet was  inflated, hemostasis was obtained.  The wounds were copiously irritated  with normal saline.  Subcu was closed with 3-0 Vicryl, skin was closed  with 4-0 nylon over all wounds.  A sterile dressing was applied. A   modified Jones dressing was applied __________  dorsiflexion. The  patient was stable to PR.      Leonides Grills, M.D.  Electronically Signed     PB/MEDQ  D:  04/14/2006  T:  04/14/2006  Job:  [id number]

## 2010-09-24 ENCOUNTER — Other Ambulatory Visit: Payer: Self-pay | Admitting: Family Medicine

## 2010-10-27 ENCOUNTER — Ambulatory Visit (INDEPENDENT_AMBULATORY_CARE_PROVIDER_SITE_OTHER): Payer: Medicare Other | Admitting: Family Medicine

## 2010-10-27 ENCOUNTER — Encounter: Payer: Self-pay | Admitting: Family Medicine

## 2010-10-27 VITALS — BP 124/70 | HR 72 | Temp 98.3°F | Wt 114.0 lb

## 2010-10-27 DIAGNOSIS — L720 Epidermal cyst: Secondary | ICD-10-CM | POA: Insufficient documentation

## 2010-10-27 DIAGNOSIS — L723 Sebaceous cyst: Secondary | ICD-10-CM

## 2010-10-27 MED ORDER — DOXYCYCLINE HYCLATE 100 MG PO CAPS: 100.0000 mg | ORAL_CAPSULE | Freq: Two times a day (BID) | ORAL | Status: DC

## 2010-10-27 NOTE — Patient Instructions (Addendum)
Looks like epidermal cyst that has drained. Treat with doxycycline twice daily for 7 days as well as warm compresses. If not bothering you, may keep as is, otherwise, return when redness has resolved and we could try to remove. Good to see you today, call us with questions.  Sebaceous Cysts Sebaceous cysts are sometimes called epidermal cysts. They may come from injury to the skin or an infected hair follicle. The cyst usually contains a "pasty" or "cheesy" looking substance which has a bad smell. This is from the substance called keratin which fills sebaceous cysts. Keratin is a protein that creates the sac of cells called sebaceous cysts.  SYMPTOMS Sebaceous cysts are usually found on the face, neck, or trunk. They also may occur in the vaginal area or other parts of the genitalia of both women and men. Sebaceous cysts are usually painless, slow-growing small bumps or lumps that move freely under the skin. It's important not to try and pop them. This may cause an infection and lead to tenderness and swelling. Occasionally infections may occur. Signs or symptoms that may indicate infection of sebaceous cysts include:   Redness.   Tenderness.   Increased temperature of the skin over the bumps or lumps.   Grayish white, cheesy, foul smelling material draining from the bump or lump.  DIAGNOSIS Sebaceous cysts are easily diagnosed on exam by your caregiver. Rarely, a biopsy may be needed to rule out other conditions that may resemble sebaceous cysts.  TREATMENT  Sebaceous cysts often get better and disappear on their own. They are rarely ever cancerous.   Sometimes they may become inflamed and tender if they become infected. This may require opening and draining the cyst. Treatment with antibiotics (medications which kill germs) may be necessary. When the infection is gone, the cyst may be removed with minor surgery.   Small inflamed cysts can often be treated by injecting steroid medications  or with antibiotics.   Sometimes sebaceous cysts become large and bothersome. If this happens, surgical removal in your caregiver's office may be necessary.  COMPLICATIONS  Sebaceous cysts may occasionally become infected and form into painful abscesses.   Even when the cyst is removed, sebaceous cyst recurrence in not unusual.   Consult your health care provider anytime you notice any type of growth, bump, or lump on your body.  SEEK MEDICAL CARE IF:  You develop any signs of infection.   Your condition is not improving, or is getting worse.   You have any other questions or concerns.  Document Released: 02/29/2004 Document Re-Released: 06/26/2008 [facility] Patient Information 2011 [place], [place].

## 2010-10-27 NOTE — Progress Notes (Signed)
  Subjective:    Patient ID: Katelyn Kennedy, female    DOB: Oct 25, 1945, 65 y.o.   MRN: [id number]  HPI CC: check arm  Seen here 05/2010 with inflammed epidermal inclusion cyst, s/p I&D.  Did well after that.  Cyst returned 2 wks ago, spontaneously drained Friday night.  Stopped draining yesterday.  + really sore, then felt better after started draining.  Has not tried warm compresses.  No fevers/chills, not currently tender.  Review of Systems Per HPI    Objective:   Physical Exam  Nursing note and vitals reviewed. Constitutional: She appears well-developed and well-nourished. No distress.  Skin: Skin is warm.          R posterior upper arm inflamed, erythematous lesion with dry caseous matl at opening, unable to express any more discharge.  Some induration, no fluctuance.  About 2cm diameter.          Assessment & Plan:

## 2010-10-27 NOTE — Assessment & Plan Note (Signed)
Inflamed, s/p spontanous drainage. Treat with doxy x 7 days, warm compresses, may return afterwards if bothering her for attempt at removal.

## 2010-10-28 ENCOUNTER — Other Ambulatory Visit: Payer: Self-pay | Admitting: Family Medicine

## 2010-10-28 DIAGNOSIS — Z1231 Encounter for screening mammogram for malignant neoplasm of breast: Secondary | ICD-10-CM

## 2010-11-05 ENCOUNTER — Ambulatory Visit: Payer: Medicare Other

## 2010-11-06 ENCOUNTER — Ambulatory Visit
Admission: RE | Admit: 2010-11-06 | Discharge: 2010-11-06 | Disposition: A | Discharge status: Home / Self Care | Payer: Medicare Other | Source: Ambulatory Visit | Attending: Family Medicine | Admitting: Family Medicine

## 2010-11-06 DIAGNOSIS — Z1231 Encounter for screening mammogram for malignant neoplasm of breast: Secondary | ICD-10-CM

## 2010-11-08 LAB — HM DIABETES EYE EXAM

## 2010-11-25 ENCOUNTER — Other Ambulatory Visit: Payer: Self-pay | Admitting: Family Medicine

## 2010-12-24 ENCOUNTER — Other Ambulatory Visit: Payer: Self-pay | Admitting: Family Medicine

## 2011-01-06 ENCOUNTER — Other Ambulatory Visit (INDEPENDENT_AMBULATORY_CARE_PROVIDER_SITE_OTHER): Payer: Medicare Other

## 2011-01-06 DIAGNOSIS — E119 Type 2 diabetes mellitus without complications: Secondary | ICD-10-CM

## 2011-01-06 LAB — COMPREHENSIVE METABOLIC PANEL
ALT: 19 U/L (ref 0–35)
AST: 24 U/L (ref 0–37)
Albumin: 4.3 g/dL (ref 3.5–5.2)
Alkaline Phosphatase: 82 U/L (ref 39–117)
BUN: 13 mg/dL (ref 6–23)
CO2: 27 mEq/L (ref 19–32)
Calcium: 10 mg/dL (ref 8.4–10.5)
Chloride: 106 mEq/L (ref 96–112)
Creatinine, Ser: 0.6 mg/dL (ref 0.4–1.2)
GFR: 104.03 mL/min (ref >60.00)
Glucose, Bld: 103 mg/dL — ABNORMAL HIGH (ref 70–99)
Potassium: 4.5 mEq/L (ref 3.5–5.1)
Sodium: 141 mEq/L (ref 135–145)
Total Bilirubin: 0.7 mg/dL (ref 0.3–1.2)
Total Protein: 7.3 g/dL (ref 6.0–8.3)

## 2011-01-06 LAB — LIPID PANEL
Cholesterol: 176 mg/dL (ref 0–200)
HDL: 71.9 mg/dL (ref >39.00)
LDL Cholesterol: 79 mg/dL (ref 0–99)
Total CHOL/HDL Ratio: 2
Triglycerides: 126 mg/dL (ref 0.0–149.0)
VLDL: 25.2 mg/dL (ref 0.0–40.0)

## 2011-01-06 LAB — HEMOGLOBIN A1C: Hgb A1c MFr Bld: 5.6 % (ref 4.6–6.5)

## 2011-01-13 ENCOUNTER — Encounter: Payer: Medicare Other | Admitting: Family Medicine

## 2011-01-15 ENCOUNTER — Ambulatory Visit (INDEPENDENT_AMBULATORY_CARE_PROVIDER_SITE_OTHER): Payer: Medicare Other | Admitting: Family Medicine

## 2011-01-15 ENCOUNTER — Encounter: Payer: Self-pay | Admitting: Family Medicine

## 2011-01-15 VITALS — BP 120/70 | HR 69 | Temp 98.6°F | Ht 61.0 in | Wt 116.4 lb

## 2011-01-15 DIAGNOSIS — R809 Proteinuria, unspecified: Secondary | ICD-10-CM

## 2011-01-15 DIAGNOSIS — E119 Type 2 diabetes mellitus without complications: Secondary | ICD-10-CM

## 2011-01-15 DIAGNOSIS — Z0000 Encounter for general adult medical examination without abnormal findings: Secondary | ICD-10-CM

## 2011-01-15 DIAGNOSIS — Z23 Encounter for immunization: Secondary | ICD-10-CM

## 2011-01-15 DIAGNOSIS — E7800 Pure hypercholesterolemia, unspecified: Secondary | ICD-10-CM

## 2011-01-15 LAB — HM DIABETES FOOT EXAM

## 2011-01-15 NOTE — Patient Instructions (Signed)
Continue great work on lifestyle.  QUIT smoking.. Let me know if you need any help with this.

## 2011-01-15 NOTE — Assessment & Plan Note (Signed)
Well controlled. Continue current medication.  

## 2011-01-15 NOTE — Progress Notes (Signed)
Subjective:    Patient ID: Katelyn Kennedy, female    DOB: Apr 25, 1945, 65 y.o.   MRN: [id number]  HPI  I have personally reviewed the Medicare Annual Wellness questionnaire and have noted 1. The patient's medical and social history 2. Their use of alcohol, tobacco or illicit drugs 3. Their current medications and supplements 4. The patient's functional ability including ADL's, fall risks, home safety risks and hearing or visual             impairment. 5. Diet and physical activities 6. Evidence for depression or mood disorders  The patients weight, height, BMI and visual acuity have been recorded in the chart I have made referrals, counseling and provided education to the patient based review of the above and I have provided the pt with a written personalized care plan for preventive services.  Diabetes: Well controlled on diet. Lab Results  Component Value Date   HGBA1C 5.6 01/06/2011  Using medications without difficulties: Hypoglycemic episodes:? Hyperglycemic episodes:? Feet problems: None Blood Sugars averaging: nontchecking eye exam within last year: Yes  Elevated Cholesterol: Well controlled (LDL at goal <100) on lipitor 20 mg daily Using medications without problems: Yes Muscle aches: NONE Diet compliance: healthy diet Exercise: Walking daily Other complaints:    Review of Systems  Constitutional: Negative for fever, fatigue and unexpected weight change.  HENT: Negative for ear pain, congestion, sore throat, sneezing, trouble swallowing and sinus pressure.   Eyes: Negative for pain and itching.  Respiratory: Negative for cough, shortness of breath and wheezing.   Cardiovascular: Negative for chest pain, palpitations and leg swelling.  Gastrointestinal: Negative for nausea, abdominal pain, diarrhea, constipation and blood in stool.  Genitourinary: Negative for dysuria, hematuria, vaginal bleeding, vaginal discharge, difficulty urinating and menstrual problem.    Skin: Negative for rash.  Neurological: Negative for syncope, weakness, light-headedness, numbness and headaches.  Psychiatric/Behavioral: Negative for confusion and dysphoric mood. The patient is not nervous/anxious.        Objective:   Physical Exam  Constitutional: Vital signs are normal. She appears well-developed and well-nourished. She is cooperative.  Non-toxic appearance. She does not appear ill. No distress.  HENT:  Head: Normocephalic.  Right Ear: Hearing, tympanic membrane, external ear and ear canal normal.  Left Ear: Hearing, tympanic membrane, external ear and ear canal normal.  Nose: Nose normal.  Eyes: Conjunctivae, EOM and lids are normal. Pupils are equal, round, and reactive to light. No foreign bodies found.  Neck: Trachea normal and normal range of motion. Neck supple. Carotid bruit is not present. No mass and no thyromegaly present.  Cardiovascular: Normal rate, regular rhythm, S1 normal, S2 normal, normal heart sounds and intact distal pulses.  Exam reveals no gallop.   No murmur heard. Pulmonary/Chest: Effort normal and breath sounds normal. No respiratory distress. She has no wheezes. She has no rhonchi. She has no rales.  Abdominal: Soft. Normal appearance and bowel sounds are normal. She exhibits no distension, no fluid wave, no abdominal bruit and no mass. There is no hepatosplenomegaly. There is no tenderness. There is no rebound, no guarding and no CVA tenderness. No hernia.  Genitourinary: No breast swelling, tenderness, discharge or bleeding. Pelvic exam was performed with patient prone.       No DVE, no  Pap  Non red  Epidermal cyst in left breast.  Lymphadenopathy:    She has no cervical adenopathy.    She has no axillary adenopathy.  Neurological: She is alert. She has normal strength. No  cranial nerve deficit or sensory deficit.  Skin: Skin is warm, dry and intact. No rash noted.  Psychiatric: Her speech is normal and behavior is normal. Judgment  normal. Her mood appears not anxious. Cognition and memory are normal. She does not exhibit a depressed mood.    Diabetic foot exam: Normal inspection No skin breakdown No calluses  Normal DP pulses Normal sensation to light touch and monofilament Nails normal       Assessment & Plan:   AMW:The patient's preventative maintenance and recommended screening tests for an annual wellness exam were reviewed in full today. Brought up to date unless services declined.  Counselled on the importance of diet, exercise, and its role in overall health and mortality. The patient's FH and SH was reviewed, including their home life, tobacco status, and drug and alcohol status.    Mammogram done 10/2010 nml DXA stable osteoporosis last year 2011 Colonoscopy: 10/2009... Adenoma.. Repeat in 5 years Up to date with PNA and tdap.  Shingles:not interested Flu: given today DVE/pap: nml pap 2011, no pap indicated. No family hx of uterine or ovarian ca. Asymptomatic. Plan to do DVE every 2 years. Pt agreeable after discussion.

## 2011-01-15 NOTE — Assessment & Plan Note (Signed)
On ACE-I 

## 2011-01-15 NOTE — Assessment & Plan Note (Signed)
Well controlled with diet and lifestyle. Essentially normal.

## 2011-01-23 ENCOUNTER — Other Ambulatory Visit: Payer: Self-pay | Admitting: Family Medicine

## 2011-02-25 ENCOUNTER — Other Ambulatory Visit: Payer: Self-pay | Admitting: Family Medicine

## 2011-03-25 ENCOUNTER — Other Ambulatory Visit: Payer: Self-pay | Admitting: Family Medicine

## 2011-03-27 ENCOUNTER — Encounter: Payer: Self-pay | Admitting: Family Medicine

## 2011-03-27 ENCOUNTER — Ambulatory Visit (INDEPENDENT_AMBULATORY_CARE_PROVIDER_SITE_OTHER): Payer: Medicare Other | Admitting: Family Medicine

## 2011-03-27 VITALS — BP 120/70 | HR 88 | Temp 98.5°F | Ht 61.5 in | Wt 119.8 lb

## 2011-03-27 DIAGNOSIS — N390 Urinary tract infection, site not specified: Secondary | ICD-10-CM

## 2011-03-27 DIAGNOSIS — R300 Dysuria: Secondary | ICD-10-CM

## 2011-03-27 LAB — POCT URINALYSIS DIPSTICK
Bilirubin, UA: NEGATIVE
Glucose, UA: NEGATIVE
Ketones, UA: NEGATIVE
Nitrite, UA: NEGATIVE
Protein, UA: NEGATIVE
Spec Grav, UA: >=1.03
Urobilinogen, UA: NEGATIVE
pH, UA: 7

## 2011-03-27 MED ORDER — SULFAMETHOXAZOLE-TRIMETHOPRIM 400-80 MG PO TABS: 1.0000 | ORAL_TABLET | Freq: Two times a day (BID) | ORAL | Status: AC

## 2011-03-27 NOTE — Progress Notes (Signed)
Patient presents with burning, urgency. No vaginal discharge or external irritation.  No STD exposure. No abd pain, no flank pain.  ROS: GEN:  no fevers, chills. GI: No n/v/d, eating normally Otherwise, ROS is as per the HPI.  PHYSICAL EXAM  Blood pressure 120/70, pulse 88, temperature 98.5 F (36.9 C), temperature source Oral, height 5' 1.5" (1.562 m), weight 119 lb 12.8 oz (54.341 kg), SpO2 98.00%.  GEN: WDWN, A&Ox4,NAD. Non-toxic HEENT: Atraumatc, normocephalic. CV: RRR, No M/G/R PULM: CTA B, No wheezes, crackles, or rhonchi ABD: S, NT, ND, +BS, no rebound. No CVAT. No suprapubic tenderness. EXT: No c/c/e  A/P: UTI. Rx with ABX as below Also has questions about nasal drainage - otc allergy meds, neti pots discussed

## 2011-03-27 NOTE — Patient Instructions (Signed)
Claritin, Zyrtec, Allegra (get the generic -- can get over the counter)  Without D

## 2011-03-30 LAB — URINE CULTURE: Colony Count: >=100000

## 2011-04-23 ENCOUNTER — Other Ambulatory Visit: Payer: Self-pay | Admitting: Family Medicine

## 2011-05-26 ENCOUNTER — Other Ambulatory Visit: Payer: Self-pay | Admitting: Family Medicine

## 2011-06-24 ENCOUNTER — Other Ambulatory Visit: Payer: Self-pay | Admitting: Family Medicine

## 2011-07-15 ENCOUNTER — Telehealth: Payer: Self-pay | Admitting: Family Medicine

## 2011-07-15 NOTE — Telephone Encounter (Signed)
Caller: Katelyn Kennedy/Patient; PCP: Excell Seltzer.; CB#: [phone];  Call regarding Boil On Breast; onset 07/06/11, chronic problem, last OV for this 5-6 years ago, last OV 12/2010.  has not drained, using warm compresses, but "not ready yet" afebrile. Guideline: Skin Lesion, with concern, verbalizes understanding of care advice, call back parameters.

## 2011-07-15 NOTE — Telephone Encounter (Signed)
This pt should be seen in 24-48 hours if boil not draining and redness spreading.  Continue wram compresses.

## 2011-07-15 NOTE — Telephone Encounter (Signed)
Noted  

## 2011-07-16 NOTE — Telephone Encounter (Signed)
Patient advised.

## 2011-07-20 ENCOUNTER — Encounter: Payer: Self-pay | Admitting: Family Medicine

## 2011-07-20 ENCOUNTER — Ambulatory Visit (INDEPENDENT_AMBULATORY_CARE_PROVIDER_SITE_OTHER): Payer: Medicare Other | Admitting: Family Medicine

## 2011-07-20 VITALS — BP 102/60 | HR 80 | Temp 98.2°F | Wt 120.0 lb

## 2011-07-20 DIAGNOSIS — N611 Abscess of the breast and nipple: Secondary | ICD-10-CM | POA: Insufficient documentation

## 2011-07-20 DIAGNOSIS — L259 Unspecified contact dermatitis, unspecified cause: Secondary | ICD-10-CM

## 2011-07-20 DIAGNOSIS — L309 Dermatitis, unspecified: Secondary | ICD-10-CM | POA: Insufficient documentation

## 2011-07-20 DIAGNOSIS — N61 Mastitis without abscess: Secondary | ICD-10-CM

## 2011-07-20 MED ORDER — SULFAMETHOXAZOLE-TRIMETHOPRIM 800-160 MG PO TABS: 1.0000 | ORAL_TABLET | Freq: Two times a day (BID) | ORAL | Status: AC

## 2011-07-20 MED ORDER — FLUOCINONIDE-E 0.05 % EX CREA: TOPICAL_CREAM | Freq: Two times a day (BID) | CUTANEOUS | Status: DC

## 2011-07-20 NOTE — Patient Instructions (Signed)
Nice to meet you. Take antibiotic (bactrim) as directed- 1 tablet twice daily for 10 days. Keep using warm compresses. Please stop by to see Shirlee Limerick on your way out.

## 2011-07-20 NOTE — Progress Notes (Signed)
Subjective:    Patient ID: Katelyn Kennedy, female    DOB: [birth year], 66 y.o.   MRN: [id number]  HPI  66 yo pt of Dr. Ermalene Searing here for breast infection.  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 Every few years, flares up.  Usually it drains on its own with warm compresses. Symptoms started this time a few days ago.  Getting larger and more painful.  She has been placing warm compresses and feels it is about to drain. No fevers, chills, nausea or vomiting.  Also has a patch of itchy dry skin on upper left thigh. It has been there for years, comes and goes. Has been more erythematous and itchy past few days.  Patient Active Problem List  Diagnoses  . DIABETES MELLITUS, TYPE II  . HYPERCHOLESTEROLEMIA  . TOBACCO ABUSE  . ALLERGIC RHINITIS  . ARTHRITIS  . OSTEOPENIA  . CHICKENPOX, HX OF  . MICROALBUMINURIA  . Epidermal inclusion cyst  . Breast abscess   Past Medical History  Diagnosis Date  . HTN (hypertension)   . History of chicken pox   . Arthritis   . Allergic rhinitis    Past Surgical History  Procedure Date  . Bartholyn gland removal 1981  . Cataract extraction   . Breast biopsy 1978    BENIGN CYST   History  Substance Use Topics  . Smoking status: Current Everyday Smoker -- 0.5 packs/day  . Smokeless tobacco: Not on file  . Alcohol Use: No   Family History  Problem Relation Age of Onset  . Diabetes Mother   . Cancer Sister 35    BREAST  . Cancer Maternal Grandmother     COLON   Allergies  Allergen Reactions  . Codeine     REACTION: nausea   Current Outpatient Prescriptions on File Prior to Visit  Medication Sig Dispense Refill  . aspirin 81 MG tablet Take 81 mg by mouth daily.        Marland Kitchen atorvastatin (LIPITOR) 20 MG tablet Take 1 tablet (20 mg total) by mouth daily.  30 tablet  11  . Calcium Carbonate-Vitamin D (CALCIUM + D) 600-200 MG-UNIT per tablet Take 1 tablet by mouth 2 (two) times daily.        .  fish oil-omega-3 fatty acids 1000 MG capsule Take 2 g by mouth daily.        Marland Kitchen lisinopril (PRINIVIL,ZESTRIL) 5 MG tablet TAKE 1/2 TABLET BY MOUTH DAILY  15 tablet  11  . Multiple Vitamin (MULTIVITAMIN) capsule Take 1 capsule by mouth daily.         The PMH, PSH, Social History, Family History, Medications, and allergies have been reviewed in [facility], and have been updated if relevant.    Review of Systems See HPI    Objective:   Physical Exam BP 102/60  Pulse 80  Temp(Src) 98.2 F (36.8 C) (Oral)  Wt 120 lb (54.432 kg)  General:  Well-developed,well-nourished,in no acute distress; alert,appropriate and cooperative throughout examination Head:  normocephalic and atraumatic.   Breasts:  Left breast:  Fluctuant breast abscess at 6'oclock position of left nipple, minimal surrounding erythema Axillary Nodes:  No palpable lymphadenopathy Psych:  Cognition and judgment appear intact. Alert and cooperative with normal attention span and concentration. No apparent delusions, illusions, hallucinations Skin: 3 cm patch of dry erythematous, scaly skin on right upper thigh, no other lesions.     Assessment & Plan:   1. Breast abscess  Deteriorated. Place on 10 day course of Bactrim to cover MRSA, refer to surgery for I and D. The patient indicates understanding of these issues and agrees with the plan.  Ambulatory referral to General Surgery  2. Eczema  Deteriorated. Rx for lidex given.

## 2011-07-31 ENCOUNTER — Telehealth: Payer: Self-pay | Admitting: Family Medicine

## 2011-07-31 NOTE — Telephone Encounter (Signed)
Well route to dr Dayton Martes since she saw her last

## 2011-07-31 NOTE — Telephone Encounter (Signed)
Triage Record Num: [id number] Operator: Revonda Humphrey Patient Name: Katelyn Kennedy Call Date & Time: 07/30/2011 3:45:09PM Patient Phone: [phone] PCP: Kerby Nora Patient Gender: Female PCP Fax : [phone] Patient DOB: 04/05/1946 Practice Name: [facility] Reason for Call: Caller: Arbell/Patient; PCP: Excell Seltzer.; CB#: [phone]; ; ; Call regarding Breast Infection; Breast infection, swelling, red, tender and ready to break open when seen in office in 4/8, started 10 days of Bactrim - finished Rx this am 4/18. Has used warm compresses and showers. Still draining white to yellow discharge, cottage cheese appearance fluid when pressing. Area size of large pea now. Afebrile. Asking if should take further doses of Bactrim (has left over Rx from another infection #12 pills). Please let patient know if she is to take further meds. Can reach patient at [phone] [ except from 4 pm to 6pm then call [phone] ] Protocol(s) Used: Skin Lesions Recommended Outcome per Protocol: See Provider within 2 Weeks Reason for Outcome: New skin problem that has persisted more than 7 days Care Advice: Do not try to open a lesion with pressure, squeezing or poking with a needle. This may cause or worsen an infection and increase risk of scarring. ~ Avoid use of perfumed or strong soaps, detergents or any product that is irritating to the skin. Only use mild, unscented soap (Aveeno, Neutrogena) for bathing to decrease irritation. ~ 07/30/2011 4:00:35PM Page 1 of 1 [id number]

## 2011-08-02 ENCOUNTER — Other Ambulatory Visit: Payer: Self-pay | Admitting: Family Medicine

## 2011-08-03 ENCOUNTER — Ambulatory Visit (INDEPENDENT_AMBULATORY_CARE_PROVIDER_SITE_OTHER): Payer: Self-pay | Admitting: General Surgery

## 2011-08-04 ENCOUNTER — Encounter: Payer: Self-pay | Admitting: Family Medicine

## 2011-08-04 ENCOUNTER — Ambulatory Visit (INDEPENDENT_AMBULATORY_CARE_PROVIDER_SITE_OTHER): Payer: Medicare Other | Admitting: Family Medicine

## 2011-08-04 VITALS — BP 138/90 | HR 72 | Temp 98.4°F | Wt 119.0 lb

## 2011-08-04 DIAGNOSIS — N611 Abscess of the breast and nipple: Secondary | ICD-10-CM

## 2011-08-04 DIAGNOSIS — N61 Mastitis without abscess: Secondary | ICD-10-CM

## 2011-08-04 MED ORDER — CLINDAMYCIN HCL 300 MG PO CAPS: 300.0000 mg | ORAL_CAPSULE | Freq: Four times a day (QID) | ORAL | Status: AC

## 2011-08-04 NOTE — Patient Instructions (Signed)
Good to see you. Please call the surgeon's office and reschedule your surgery- you can ask for sedation. Take antibiotic (cindamycin) as directed- 1 tablet four times daily for 10 days. Keep using warm compresses.

## 2011-08-04 NOTE — Progress Notes (Signed)
Subjective:    Patient ID: Katelyn Kennedy, female    DOB: 1945-08-22, 66 y.o.   MRN: [id number]  HPI  66 yo pt of Dr. Ermalene Searing here for follow up breast infection.    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 Every few years, flares up.  Usually it drains on its own with warm compresses.  Symptoms started this time a few weeks ago. Saw her on 4/8- treated with 10 day course of Bactrim.  Referred to surgeon for drainage- she cancelled surgery as symptoms were improving.  Started draining and becoming more painful again a few days ago, getting progressively worse again.  No fevers, chills, nausea or vomiting.       Patient Active Problem List  Diagnoses  . DIABETES MELLITUS, TYPE II  . HYPERCHOLESTEROLEMIA  . TOBACCO ABUSE  . ALLERGIC RHINITIS  . ARTHRITIS  . OSTEOPENIA  . CHICKENPOX, HX OF  . MICROALBUMINURIA  . Epidermal inclusion cyst  . Breast abscess  . Eczema   Past Medical History  Diagnosis Date  . HTN (hypertension)   . History of chicken pox   . Arthritis   . Allergic rhinitis    Past Surgical History  Procedure Date  . Bartholyn gland removal 1981  . Cataract extraction   . Breast biopsy 1978    BENIGN CYST   History  Substance Use Topics  . Smoking status: Current Everyday Smoker -- 0.5 packs/day  . Smokeless tobacco: Not on file  . Alcohol Use: No   Family History  Problem Relation Age of Onset  . Diabetes Mother   . Cancer Sister 31    BREAST  . Cancer Maternal Grandmother     COLON   Allergies  Allergen Reactions  . Codeine     REACTION: nausea   Current Outpatient Prescriptions on File Prior to Visit  Medication Sig Dispense Refill  . aspirin 81 MG tablet Take 81 mg by mouth daily.        Marland Kitchen atorvastatin (LIPITOR) 20 MG tablet TAKE 1 TABLET BY MOUTH EVERY DAY  30 tablet  11  . Calcium Carbonate-Vitamin D (CALCIUM + D) 600-200 MG-UNIT per tablet Take 1 tablet by mouth 2 (two) times daily.         . fish oil-omega-3 fatty acids 1000 MG capsule Take 2 g by mouth daily.        . fluocinonide-emollient (LIDEX-E) 0.05 % cream Apply topically 2 (two) times daily.  30 g  0  . lisinopril (PRINIVIL,ZESTRIL) 5 MG tablet TAKE 1/2 TABLET BY MOUTH DAILY  15 tablet  11  . Multiple Vitamin (MULTIVITAMIN) capsule Take 1 capsule by mouth daily.         The PMH, PSH, Social History, Family History, Medications, and allergies have been reviewed in [facility], and have been updated if relevant.    Review of Systems See HPI    Objective:   Physical Exam BP 138/90  Pulse 72  Temp(Src) 98.4 F (36.9 C) (Oral)  Wt 119 lb (53.978 kg)  General:  Well-developed,well-nourished,in no acute distress; alert,appropriate and cooperative throughout examination Head:  normocephalic and atraumatic.   Breasts:  Left breast:  Nonfluctuant breast abscess at 6'oclock position of left nipple, minimal surrounding erythema Axillary Nodes:  No palpable lymphadenopathy Psych:  Cognition and judgment appear intact. Alert and cooperative with normal attention span and concentration. No apparent delusions, illusions, hallucinations      Assessment & Plan:  1. Breast abscess  Deteriorated. Place on 10 day course of Clindamycin.  Advised rescheduling surgery for I and D. The patient indicates understanding of these issues and agrees with the plan.  Ambulatory referral to General Surgery

## 2011-08-06 ENCOUNTER — Other Ambulatory Visit (INDEPENDENT_AMBULATORY_CARE_PROVIDER_SITE_OTHER): Payer: Self-pay | Admitting: General Surgery

## 2011-08-06 ENCOUNTER — Ambulatory Visit (INDEPENDENT_AMBULATORY_CARE_PROVIDER_SITE_OTHER): Payer: Medicare Other | Admitting: General Surgery

## 2011-08-06 ENCOUNTER — Encounter (INDEPENDENT_AMBULATORY_CARE_PROVIDER_SITE_OTHER): Payer: Self-pay | Admitting: General Surgery

## 2011-08-06 VITALS — BP 121/77 | HR 85 | Temp 98.7°F | Resp 20 | Ht 61.0 in | Wt 120.0 lb

## 2011-08-06 DIAGNOSIS — N61 Mastitis without abscess: Secondary | ICD-10-CM

## 2011-08-06 DIAGNOSIS — N611 Abscess of the breast and nipple: Secondary | ICD-10-CM

## 2011-08-06 NOTE — Progress Notes (Signed)
Subjective:     Patient ID: RONALD VINSANT, female   DOB: July 16, 1945, 66 y.o.   MRN: [id number]  HPI Patient complains of a several week history of worsening left breast abscess. She has had these in the past requiring incision and drainage. She has been taking clindamycin.  Review of Systems     Objective:   Physical Exam Left breast has an abscess present with overlying erythema, fluctuance, and induration of 2 cm extending from approximately 9:00 to 7:00. It is not spontaneously draining. Procedure note: The area was prepped in a sterile fashion. 1% lidocaine with epinephrine was injected. A linear incision was made over the fluctuant area. Purulent material was evacuated and was sent for culture. The wound was irrigated and packed with iodoform. Gauze dressing was applied and she tolerated this well    Assessment:     Left breast abscess    Plan:     This was incised and drained as above. Patient was instructed in wound care. She will continue clindamycin. We'll see her back in about 10 days unless things worsen.

## 2011-08-09 LAB — WOUND CULTURE
Gram Stain: NONE SEEN
Gram Stain: NONE SEEN
Organism ID, Bacteria: NO GROWTH

## 2011-08-19 ENCOUNTER — Other Ambulatory Visit: Payer: Self-pay | Admitting: *Deleted

## 2011-08-19 MED ORDER — ATORVASTATIN CALCIUM 20 MG PO TABS: 20.0000 mg | ORAL_TABLET | Freq: Every day | ORAL | Status: DC

## 2011-08-19 MED ORDER — LISINOPRIL 5 MG PO TABS: 2.5000 mg | ORAL_TABLET | Freq: Every day | ORAL | Status: DC

## 2011-08-26 ENCOUNTER — Ambulatory Visit (INDEPENDENT_AMBULATORY_CARE_PROVIDER_SITE_OTHER): Payer: Medicare Other | Admitting: General Surgery

## 2011-08-26 ENCOUNTER — Encounter (INDEPENDENT_AMBULATORY_CARE_PROVIDER_SITE_OTHER): Payer: Self-pay | Admitting: General Surgery

## 2011-08-26 VITALS — BP 120/70 | HR 60 | Temp 98.7°F | Resp 18 | Ht 61.0 in | Wt 121.0 lb

## 2011-08-26 DIAGNOSIS — N61 Mastitis without abscess: Secondary | ICD-10-CM

## 2011-08-26 DIAGNOSIS — N611 Abscess of the breast and nipple: Secondary | ICD-10-CM

## 2011-08-26 NOTE — Progress Notes (Signed)
Subjective:     Patient ID: Katelyn Kennedy, female   DOB: 08/30/1945, 66 y.o.   MRN: [id number]  HPI Patient presents status post incision and drainage of left periareolar breast abscess she is doing better. She completed her course of antibiotics.  Review of Systems     Objective:   Physical Exam Left breast periareolar abscess is nearly healed. There is no evidence of ongoing infection. There is no cellulitis.   Assessment:    Doing very well status post incision and drainage of left breast abscess     Plan:     Return p.r.n.

## 2011-12-21 ENCOUNTER — Other Ambulatory Visit: Payer: Self-pay | Admitting: Family Medicine

## 2011-12-21 DIAGNOSIS — Z1231 Encounter for screening mammogram for malignant neoplasm of breast: Secondary | ICD-10-CM

## 2012-01-07 ENCOUNTER — Other Ambulatory Visit (INDEPENDENT_AMBULATORY_CARE_PROVIDER_SITE_OTHER): Payer: Medicare Other

## 2012-01-07 ENCOUNTER — Telehealth: Payer: Self-pay | Admitting: Family Medicine

## 2012-01-07 DIAGNOSIS — E119 Type 2 diabetes mellitus without complications: Secondary | ICD-10-CM

## 2012-01-07 DIAGNOSIS — M8580 Other specified disorders of bone density and structure, unspecified site: Secondary | ICD-10-CM

## 2012-01-07 DIAGNOSIS — E7800 Pure hypercholesterolemia, unspecified: Secondary | ICD-10-CM

## 2012-01-07 DIAGNOSIS — Z79899 Other long term (current) drug therapy: Secondary | ICD-10-CM

## 2012-01-07 DIAGNOSIS — R809 Proteinuria, unspecified: Secondary | ICD-10-CM

## 2012-01-07 LAB — LIPID PANEL
Cholesterol: 188 mg/dL (ref 0–200)
HDL: 59.4 mg/dL (ref >39.00)
LDL Cholesterol: 102 mg/dL — ABNORMAL HIGH (ref 0–99)
Total CHOL/HDL Ratio: 3
Triglycerides: 132 mg/dL (ref 0.0–149.0)
VLDL: 26.4 mg/dL (ref 0.0–40.0)

## 2012-01-07 LAB — CBC WITH DIFFERENTIAL/PLATELET
Basophils Absolute: 0 10*3/uL (ref 0.0–0.1)
Basophils Relative: 0.4 % (ref 0.0–3.0)
Eosinophils Absolute: 0.1 10*3/uL (ref 0.0–0.7)
Eosinophils Relative: 1.2 % (ref 0.0–5.0)
HCT: 42 % (ref 36.0–46.0)
Hemoglobin: 14 g/dL (ref 12.0–15.0)
Lymphocytes Relative: 33 % (ref 12.0–46.0)
Lymphs Abs: 2.6 10*3/uL (ref 0.7–4.0)
MCHC: 33.3 g/dL (ref 30.0–36.0)
MCV: 96.6 fl (ref 78.0–100.0)
Monocytes Absolute: 0.6 10*3/uL (ref 0.1–1.0)
Monocytes Relative: 7.2 % (ref 3.0–12.0)
Neutro Abs: 4.5 10*3/uL (ref 1.4–7.7)
Neutrophils Relative %: 58.2 % (ref 43.0–77.0)
Platelets: 261 10*3/uL (ref 150.0–400.0)
RBC: 4.34 Mil/uL (ref 3.87–5.11)
RDW: 12.9 % (ref 11.5–14.6)
WBC: 7.8 10*3/uL (ref 4.5–10.5)

## 2012-01-07 LAB — COMPREHENSIVE METABOLIC PANEL
ALT: 19 U/L (ref 0–35)
AST: 23 U/L (ref 0–37)
Albumin: 4.3 g/dL (ref 3.5–5.2)
Alkaline Phosphatase: 84 U/L (ref 39–117)
BUN: 14 mg/dL (ref 6–23)
CO2: 25 mEq/L (ref 19–32)
Calcium: 10.2 mg/dL (ref 8.4–10.5)
Chloride: 103 mEq/L (ref 96–112)
Creatinine, Ser: 0.7 mg/dL (ref 0.4–1.2)
GFR: 88.49 mL/min (ref >60.00)
Glucose, Bld: 101 mg/dL — ABNORMAL HIGH (ref 70–99)
Potassium: 4.7 mEq/L (ref 3.5–5.1)
Sodium: 139 mEq/L (ref 135–145)
Total Bilirubin: 0.8 mg/dL (ref 0.3–1.2)
Total Protein: 7 g/dL (ref 6.0–8.3)

## 2012-01-07 LAB — HEMOGLOBIN A1C: Hgb A1c MFr Bld: 5.7 % (ref 4.6–6.5)

## 2012-01-07 LAB — MICROALBUMIN / CREATININE URINE RATIO
Creatinine,U: 35 mg/dL
Microalb Creat Ratio: 0.9 mg/g (ref 0.0–30.0)
Microalb, Ur: 0.3 mg/dL (ref 0.0–1.9)

## 2012-01-07 LAB — TSH: TSH: 1.46 u[IU]/mL (ref 0.35–5.50)

## 2012-01-07 NOTE — Telephone Encounter (Signed)
Message copied by Excell Seltzer on Thu Jan 07, 2012  9:49 AM ------      Message from: Alvina Chou      Created: Thu Jan 07, 2012  9:39 AM      Regarding: lab orders asap please       Patient is scheduled for CPX labs, please order future labs, Thanks , Katelyn Kennedy       patient here now

## 2012-01-07 NOTE — Addendum Note (Signed)
Addended by: Alvina Chou on: 01/07/2012 10:55 AM   Modules accepted: Orders

## 2012-01-07 NOTE — Telephone Encounter (Signed)
Pt also needs A1C. Agree with other ordered labs.  These were not entered earlier because there was no request on my desktop.

## 2012-01-08 LAB — VITAMIN D 25 HYDROXY (VIT D DEFICIENCY, FRACTURES): Vit D, 25-Hydroxy: 57 ng/mL (ref 30–89)

## 2012-01-11 ENCOUNTER — Other Ambulatory Visit: Payer: Medicare Other

## 2012-01-18 ENCOUNTER — Encounter: Payer: Medicare Other | Admitting: Family Medicine

## 2012-01-19 ENCOUNTER — Ambulatory Visit (INDEPENDENT_AMBULATORY_CARE_PROVIDER_SITE_OTHER): Payer: Medicare Other | Admitting: Family Medicine

## 2012-01-19 ENCOUNTER — Encounter: Payer: Self-pay | Admitting: Family Medicine

## 2012-01-19 ENCOUNTER — Encounter: Payer: Self-pay | Admitting: *Deleted

## 2012-01-19 ENCOUNTER — Ambulatory Visit (INDEPENDENT_AMBULATORY_CARE_PROVIDER_SITE_OTHER)
Admission: RE | Admit: 2012-01-19 | Discharge: 2012-01-19 | Disposition: A | Discharge status: Home / Self Care | Payer: Medicare Other | Source: Ambulatory Visit | Attending: Family Medicine | Admitting: Family Medicine

## 2012-01-19 VITALS — BP 124/78 | HR 72 | Temp 98.6°F | Ht 60.0 in | Wt 119.0 lb

## 2012-01-19 DIAGNOSIS — F17200 Nicotine dependence, unspecified, uncomplicated: Secondary | ICD-10-CM

## 2012-01-19 DIAGNOSIS — Z0000 Encounter for general adult medical examination without abnormal findings: Secondary | ICD-10-CM

## 2012-01-19 DIAGNOSIS — E7800 Pure hypercholesterolemia, unspecified: Secondary | ICD-10-CM

## 2012-01-19 DIAGNOSIS — M899 Disorder of bone, unspecified: Secondary | ICD-10-CM

## 2012-01-19 DIAGNOSIS — E119 Type 2 diabetes mellitus without complications: Secondary | ICD-10-CM

## 2012-01-19 DIAGNOSIS — Z01419 Encounter for gynecological examination (general) (routine) without abnormal findings: Secondary | ICD-10-CM

## 2012-01-19 DIAGNOSIS — J309 Allergic rhinitis, unspecified: Secondary | ICD-10-CM

## 2012-01-19 DIAGNOSIS — Z23 Encounter for immunization: Secondary | ICD-10-CM

## 2012-01-19 LAB — HM DIABETES FOOT EXAM

## 2012-01-19 MED ORDER — FLUTICASONE PROPIONATE 50 MCG/ACT NA SUSP: 2.0000 | Freq: Every day | NASAL | Status: DC

## 2012-01-19 NOTE — Assessment & Plan Note (Addendum)
Possible viral infection as well. Treat with zyrtec and flonase. Call if not improving as expected in 7-10 days.

## 2012-01-19 NOTE — Assessment & Plan Note (Addendum)
Counseled to quit. Currently not interested in med, working on weaning down.

## 2012-01-19 NOTE — Progress Notes (Signed)
I have personally reviewed the Medicare Annual Wellness questionnaire and have noted  1. The patient's medical and social history  2. Their use of alcohol, tobacco or illicit drugs  3. Their current medications and supplements  4. The patient's functional ability including ADL's, fall risks, home safety risks and hearing or visual  impairment.  5. Diet and physical activities  6. Evidence for depression or mood disorders  The patients weight, height, BMI and visual acuity have been recorded in the chart  I have made referrals, counseling and provided education to the patient based review of the above and I have provided the pt with a written personalized care plan for preventive services.   Has been having some allergy/cold symptoms x 1 week.. Tylenol sinus (no help) and allegra D (made her feel tired)  Has never tried zyrtec.  Nasal congestion, clear, sinus pressure, chest congestion, cough occ moist, no SOB, no wheeze  Some sore throat, PND. Usually has allergies this time of year. No ear pain, no fever. Has used flonase in past.  Diabetes: Well controlled on diet.  Lab Results  Component Value Date   HGBA1C 5.7 01/07/2012  Using medications without difficulties:  Hypoglycemic episodes:?  Hyperglycemic episodes:?  Feet problems: None  Blood Sugars averaging: non checking  eye exam within last year: Yes    Wt Readings from Last 3 Encounters:  01/19/12 119 lb (53.978 kg)  08/26/11 121 lb (54.885 kg)  08/06/11 120 lb (54.432 kg)     Elevated Cholesterol: Well controlled (LDL almost l <100) on lipitor 20 mg daily  Lab Results  Component Value Date   CHOL 188 01/07/2012   HDL 59.40 01/07/2012   LDLCALC 102* 01/07/2012   LDLDIRECT 169.1 03/20/2010   TRIG 132.0 01/07/2012   CHOLHDL 3 01/07/2012  Using medications without problems: Yes  Muscle aches: NONE  Diet compliance: healthy diet  Exercise: Walking daily  Other complaints:   Review of Systems  Constitutional: Negative for  fever, fatigue and unexpected weight change.  HENT: No trouble swallowing and sinus pressure.  Eyes: Negative for pain and itching.  Respiratory:see above  Cardiovascular: Negative for chest pain, palpitations and leg swelling.  Gastrointestinal: Negative for nausea, abdominal pain, diarrhea, constipation and blood in stool.  Genitourinary: Negative for dysuria, hematuria, vaginal bleeding, vaginal discharge, difficulty urinating and menstrual problem.  Skin: Negative for rash.  Neurological: Negative for syncope, weakness, light-headedness, numbness and headaches.  Psychiatric/Behavioral: Negative for confusion and dysphoric mood. The patient is not nervous/anxious.    Objective:   Physical Exam  Constitutional: Vital signs are normal. She appears well-developed and well-nourished. She is cooperative. Non-toxic appearance. She does not appear ill. No distress.  HENT:  Head: Normocephalic.  Right Ear: Hearing, tympanic membrane, external ear and ear canal normal.  Left Ear: Hearing, tympanic membrane, external ear and ear canal normal.  Nose: Nose normal.  Eyes: Conjunctivae, EOM and lids are normal. Pupils are equal, round, and reactive to light. No foreign bodies found.  Neck: Trachea normal and normal range of motion. Neck supple. Carotid bruit is not present. No mass and no thyromegaly present.  Cardiovascular: Normal rate, regular rhythm, S1 normal, S2 normal, normal heart sounds and intact distal pulses. Exam reveals no gallop.  No murmur heard.  Pulmonary/Chest: Effort normal and breath sounds normal. No respiratory distress. She has no wheezes. She has no rhonchi. She has no rales.  Abdominal: Soft. Normal appearance and bowel sounds are normal. She exhibits no distension, no fluid  wave, no abdominal bruit and no mass. There is no hepatosplenomegaly. There is no tenderness. There is no rebound, no guarding and no CVA tenderness. No hernia.  Genitourinary: No breast swelling,  tenderness, discharge or bleeding. Pelvic exam was performed with patient prone.  DVE: no adenexal or uterine enlargement or masses noted, no external vaginal lesions. Lymphadenopathy:  She has no cervical adenopathy.  She has no axillary adenopathy.  Neurological: She is alert. She has normal strength. No cranial nerve deficit or sensory deficit.  Skin: Skin is warm, dry and intact. No rash noted.  Psychiatric: Her speech is normal and behavior is normal. Judgment normal. Her mood appears not anxious. Cognition and memory are normal. She does not exhibit a depressed mood.   Diabetic foot exam:  Normal inspection  No skin breakdown  No calluses  Normal DP pulses  Normal sensation to light touch and monofilament  Nails normal   Assessment & Plan:   AMW:The patient's preventative maintenance and recommended screening tests for an annual wellness exam were reviewed in full today.  Brought up to date unless services declined.  Counselled on the importance of diet, exercise, and its role in overall health and mortality.  The patient's FH and SH was reviewed, including their home life, tobacco status, and drug and alcohol status.   Mammogram pending osteoporosis last DEXA 2011, due for re-eval this year, sent in order, she will schedule. Colonoscopy: 10/2009... Adenoma.. Repeat in 5 years 2016 Up to date with PNA and tdap.  Shingles--looking into,  Flu: given today  DVE/pap: nml pap 2011, no pap indicated. No family hx of uterine or ovarian ca. Asymptomatic. Plan to do DVE every 2 years, due this year.  Smoker: only 1/2 pack a day. Has been smoking 40 pack years.  nml CXR in 2008, will repeat today. Given sick will hold on spirometry today.

## 2012-01-19 NOTE — Addendum Note (Signed)
Addended by: Sydell Axon C on: 01/19/2012 11:16 AM   Modules accepted: Orders

## 2012-01-19 NOTE — Assessment & Plan Note (Signed)
Well controlled with diet. 

## 2012-01-19 NOTE — Assessment & Plan Note (Signed)
Due for DEXA, vit D in nml range.

## 2012-01-19 NOTE — Assessment & Plan Note (Signed)
Worse in last year but still at goal. Continue lipitor, get back on track with lifestyle.

## 2012-01-19 NOTE — Patient Instructions (Addendum)
Look into shingles vaccine. Given flu vaccine today. Get back on track with healthy eating, low cholesterol.  Keep up regular exercise. Call  Breast center to add on bone density to mammogram next week. Start zyrtec at bedtime  And flonase 2 sprays per nostril daily. Call if not improving as expected. Quit smoking.

## 2012-01-27 ENCOUNTER — Other Ambulatory Visit: Payer: Medicare Other

## 2012-01-27 ENCOUNTER — Ambulatory Visit: Payer: Medicare Other

## 2012-02-23 ENCOUNTER — Encounter: Payer: Self-pay | Admitting: Family Medicine

## 2012-02-23 ENCOUNTER — Encounter: Payer: Self-pay | Admitting: *Deleted

## 2012-02-23 ENCOUNTER — Ambulatory Visit
Admission: RE | Admit: 2012-02-23 | Discharge: 2012-02-23 | Disposition: A | Discharge status: Home / Self Care | Payer: Medicare Other | Source: Ambulatory Visit | Attending: Family Medicine | Admitting: Family Medicine

## 2012-02-23 DIAGNOSIS — M899 Disorder of bone, unspecified: Secondary | ICD-10-CM

## 2012-02-23 DIAGNOSIS — M949 Disorder of cartilage, unspecified: Secondary | ICD-10-CM

## 2012-02-23 DIAGNOSIS — Z1231 Encounter for screening mammogram for malignant neoplasm of breast: Secondary | ICD-10-CM

## 2012-03-03 ENCOUNTER — Ambulatory Visit (INDEPENDENT_AMBULATORY_CARE_PROVIDER_SITE_OTHER): Payer: Medicare Other | Admitting: Family Medicine

## 2012-03-03 ENCOUNTER — Encounter: Payer: Self-pay | Admitting: Family Medicine

## 2012-03-03 VITALS — BP 110/60 | HR 73 | Temp 98.6°F | Ht 60.0 in | Wt 120.5 lb

## 2012-03-03 DIAGNOSIS — M810 Age-related osteoporosis without current pathological fracture: Secondary | ICD-10-CM

## 2012-03-03 MED ORDER — ALENDRONATE SODIUM 70 MG PO TABS: 70.0000 mg | ORAL_TABLET | ORAL | Status: DC

## 2012-03-03 NOTE — Patient Instructions (Addendum)
QUIT smoking.  Increase exercsie. Continue Ca and vitD Review info packets. Start fosamax weekly. Make sure to take 30 min from a meal and do not lay down after taking.

## 2012-03-03 NOTE — Progress Notes (Signed)
  Subjective:    Patient ID: Katelyn Kennedy, female    DOB: February 07, 1946, 66 y.o.   MRN: [id number]  HPI 66 year old female presents to discuss new diagnosis osteoporosis and recent bone density results.  DEXA now show spine -1.7 and hip -2.5.Marland KitchenMarland Kitchenprogressed in last 2 years from  Osteopenia.( -1.2 and 2.2 respectively)  On calcium and vit D regularly. She walks 15 min a day. She does smok btu is working on decreasing.    Review of Systems  Constitutional: Negative for fever and fatigue.  HENT: Negative for ear pain.   Eyes: Negative for pain.  Respiratory: Negative for chest tightness and shortness of breath.   Cardiovascular: Negative for chest pain, palpitations and leg swelling.  Gastrointestinal: Negative for abdominal pain.  Genitourinary: Negative for dysuria.       Objective:   Physical Exam  Constitutional: She is oriented to person, place, and time. She appears well-developed and well-nourished.  Neck: Normal range of motion. Neck supple.  Cardiovascular: Normal rate and regular rhythm.   Pulmonary/Chest: Effort normal and breath sounds normal.  Neurological: She is alert and oriented to person, place, and time.          Assessment & Plan:

## 2012-03-03 NOTE — Assessment & Plan Note (Signed)
Total visit time 15 minutes, > 50% spent counseling and cordinating patients care.  info given. Explained BMD test and osteoporosis, reviewed med options. Start fosamax once weekly x 3-5 years. Plan recheck BMD in 2 year.

## 2012-05-18 ENCOUNTER — Other Ambulatory Visit: Payer: Self-pay | Admitting: Family Medicine

## 2012-05-20 ENCOUNTER — Ambulatory Visit (INDEPENDENT_AMBULATORY_CARE_PROVIDER_SITE_OTHER): Payer: Medicare Other | Admitting: Family Medicine

## 2012-05-20 ENCOUNTER — Encounter: Payer: Self-pay | Admitting: Family Medicine

## 2012-05-20 VITALS — BP 118/70 | HR 64 | Temp 98.3°F | Ht 60.0 in | Wt 117.8 lb

## 2012-05-20 DIAGNOSIS — E119 Type 2 diabetes mellitus without complications: Secondary | ICD-10-CM

## 2012-05-20 DIAGNOSIS — IMO0002 Reserved for concepts with insufficient information to code with codable children: Secondary | ICD-10-CM

## 2012-05-20 MED ORDER — DOXYCYCLINE HYCLATE 50 MG PO CAPS: 50.0000 mg | ORAL_CAPSULE | Freq: Two times a day (BID) | ORAL | Status: DC

## 2012-05-20 MED ORDER — DOXYCYCLINE HYCLATE 100 MG PO CAPS: 100.0000 mg | ORAL_CAPSULE | Freq: Two times a day (BID) | ORAL | Status: AC

## 2012-05-20 NOTE — Progress Notes (Signed)
  Subjective:    Patient ID: Katelyn Kennedy, female    DOB: 12-09-1945, 67 y.o.   MRN: [id number]  HPI 67 year old female with diabetes, previously well controlled presents with new onset abcsess. Lab Results  Component Value Date   HGBA1C 5.7 01/07/2012   She noted it years ago... Cyst on right shoulder.  Has had infection in this cyst in past, treated by Dr. Reece Agar and Dr. Salena Saner. 2012.  In the last 2 weeks she slow increase it redness and warmth. Increase in swelling.  Yesterday it finally drained some but not much.  She has been doing warm water compresses.  No fever, no N/V.   Review of Systems  Constitutional: Negative for fever and fatigue.  HENT: Negative for ear pain.   Eyes: Negative for pain.  Respiratory: Negative for chest tightness and shortness of breath.   Cardiovascular: Negative for chest pain, palpitations and leg swelling.  Gastrointestinal: Negative for abdominal pain.  Genitourinary: Negative for dysuria.       Objective:   Physical Exam  Constitutional: She appears well-developed and well-nourished.  HENT:  Head: Normocephalic.  Right Ear: External ear normal.  Left Ear: External ear normal.  Nose: Nose normal.  Mouth/Throat: Oropharynx is clear and moist. No oropharyngeal exudate.  Eyes: Conjunctivae normal are normal. Pupils are equal, round, and reactive to light. Right eye exhibits no discharge. Left eye exhibits no discharge.  Neck: Normal range of motion. Neck supple.  Cardiovascular: Normal rate and regular rhythm.  Exam reveals no friction rub.   No murmur heard. Pulmonary/Chest: Effort normal and breath sounds normal.  Skin:       Right shoulder... Cyst, round raised 2 cm diameter and 0.5 cm raised, minimal erythema, discharge with thick fluid and pus when slight pressure applied          Assessment & Plan:

## 2012-05-20 NOTE — Addendum Note (Signed)
Addended by: Consuello Masse on: 05/20/2012 02:21 PM   Modules accepted: Orders

## 2012-05-20 NOTE — Patient Instructions (Signed)
Warm compresses 2-3 times a day.  Start and complete antibitoics.  Call if redness , pain or swelling worsening or spreading. Call if fever on antibiotics as well.

## 2012-05-20 NOTE — Assessment & Plan Note (Signed)
Well controlled.. Follow CBGs closely with infection as able.

## 2012-05-20 NOTE — Assessment & Plan Note (Signed)
Infection of cyst.  Given lesion open and draining.. No need for I and D. Culture sent. Will treat with antibiotics and warm compress.

## 2012-05-23 LAB — WOUND CULTURE
Gram Stain: NONE SEEN
Organism ID, Bacteria: NO GROWTH

## 2012-06-03 ENCOUNTER — Other Ambulatory Visit: Payer: Self-pay | Admitting: Family Medicine

## 2012-07-08 ENCOUNTER — Telehealth: Payer: Self-pay | Admitting: Family Medicine

## 2012-07-08 DIAGNOSIS — M810 Age-related osteoporosis without current pathological fracture: Secondary | ICD-10-CM

## 2012-07-08 DIAGNOSIS — E7800 Pure hypercholesterolemia, unspecified: Secondary | ICD-10-CM

## 2012-07-08 DIAGNOSIS — E119 Type 2 diabetes mellitus without complications: Secondary | ICD-10-CM

## 2012-07-08 NOTE — Telephone Encounter (Signed)
Message copied by Excell Seltzer on Fri Jul 08, 2012  5:12 PM ------      Message from: Baldomero Lamy      Created: Thu Jul 07, 2012  1:15 PM      Regarding: f/u labs Tues 07/12/12       Please order  future f/u labs for pt's upcomming lab appt.      Thanks      Tasha             ------

## 2012-07-12 ENCOUNTER — Other Ambulatory Visit: Payer: Medicare Other

## 2012-07-15 ENCOUNTER — Other Ambulatory Visit (INDEPENDENT_AMBULATORY_CARE_PROVIDER_SITE_OTHER): Payer: Medicare Other

## 2012-07-15 DIAGNOSIS — E7800 Pure hypercholesterolemia, unspecified: Secondary | ICD-10-CM

## 2012-07-15 DIAGNOSIS — E119 Type 2 diabetes mellitus without complications: Secondary | ICD-10-CM

## 2012-07-15 DIAGNOSIS — M810 Age-related osteoporosis without current pathological fracture: Secondary | ICD-10-CM

## 2012-07-15 LAB — COMPREHENSIVE METABOLIC PANEL
ALT: 23 U/L (ref 0–35)
AST: 26 U/L (ref 0–37)
Albumin: 4.4 g/dL (ref 3.5–5.2)
Alkaline Phosphatase: 58 U/L (ref 39–117)
BUN: 16 mg/dL (ref 6–23)
CO2: 26 mEq/L (ref 19–32)
Calcium: 9.6 mg/dL (ref 8.4–10.5)
Chloride: 103 mEq/L (ref 96–112)
Creatinine, Ser: 0.7 mg/dL (ref 0.4–1.2)
GFR: 94.56 mL/min (ref >60.00)
Glucose, Bld: 91 mg/dL (ref 70–99)
Potassium: 4.2 mEq/L (ref 3.5–5.1)
Sodium: 137 mEq/L (ref 135–145)
Total Bilirubin: 0.6 mg/dL (ref 0.3–1.2)
Total Protein: 7.5 g/dL (ref 6.0–8.3)

## 2012-07-15 LAB — HEMOGLOBIN A1C: Hgb A1c MFr Bld: 5.6 % (ref 4.6–6.5)

## 2012-07-15 LAB — LIPID PANEL
Cholesterol: 172 mg/dL (ref 0–200)
HDL: 62.1 mg/dL (ref >39.00)
LDL Cholesterol: 85 mg/dL (ref 0–99)
Total CHOL/HDL Ratio: 3
Triglycerides: 126 mg/dL (ref 0.0–149.0)
VLDL: 25.2 mg/dL (ref 0.0–40.0)

## 2012-07-16 LAB — VITAMIN D 25 HYDROXY (VIT D DEFICIENCY, FRACTURES): Vit D, 25-Hydroxy: 54 ng/mL (ref 30–89)

## 2012-07-19 ENCOUNTER — Encounter: Payer: Self-pay | Admitting: Family Medicine

## 2012-07-19 ENCOUNTER — Ambulatory Visit (INDEPENDENT_AMBULATORY_CARE_PROVIDER_SITE_OTHER): Payer: Medicare Other | Admitting: Family Medicine

## 2012-07-19 VITALS — BP 120/84 | HR 72 | Temp 98.2°F | Ht 60.0 in | Wt 117.0 lb

## 2012-07-19 DIAGNOSIS — E7800 Pure hypercholesterolemia, unspecified: Secondary | ICD-10-CM

## 2012-07-19 DIAGNOSIS — E119 Type 2 diabetes mellitus without complications: Secondary | ICD-10-CM

## 2012-07-19 DIAGNOSIS — F17200 Nicotine dependence, unspecified, uncomplicated: Secondary | ICD-10-CM

## 2012-07-19 NOTE — Patient Instructions (Signed)
Quit smoking. 

## 2012-07-19 NOTE — Assessment & Plan Note (Signed)
Well controlled. Continue current medication.  

## 2012-07-19 NOTE — Assessment & Plan Note (Signed)
Well controlled almost normal with diet changes.

## 2012-07-19 NOTE — Progress Notes (Signed)
  Subjective:    Patient ID: Katelyn Kennedy, female    DOB: 1945/10/22, 67 y.o.   MRN: [id number]  HPI  Diabetes: Well controlled on diet.  Shoulder abscess healed. Lab Results  Component Value Date   HGBA1C 5.6 07/15/2012  Using medications without difficulties:  Hypoglycemic episodes:?  Hyperglycemic episodes:?  Feet problems: None  Blood Sugars averaging: no checking  eye exam within last year: None but has it seet up.  Wt Readings from Last 3 Encounters:  07/19/12 117 lb (53.071 kg)  05/20/12 117 lb 12 oz (53.411 kg)  03/03/12 120 lb 8 oz (54.658 kg)     Elevated Cholesterol: Well controlled (LDL almost l <100) on lipitor 20 mg daily  Lab Results  Component Value Date   CHOL 172 07/15/2012   HDL 62.10 07/15/2012   LDLCALC 85 07/15/2012   LDLDIRECT 169.1 03/20/2010   TRIG 126.0 07/15/2012   CHOLHDL 3 07/15/2012   Using medications without problems: Yes  Muscle aches: None Diet compliance: healthy diet  Exercise: Walking daily  Other complaints:   No falls.  .      Review of Systems  Constitutional: Negative for fever and fatigue.  HENT: Negative for ear pain.   Eyes: Negative for pain.  Respiratory: Negative for chest tightness and shortness of breath.   Cardiovascular: Negative for chest pain, palpitations and leg swelling.  Gastrointestinal: Negative for abdominal pain.  Genitourinary: Negative for dysuria.       Objective:   Physical Exam  Constitutional: Vital signs are normal. She appears well-developed and well-nourished. She is cooperative.  Non-toxic appearance. She does not appear ill. No distress.  HENT:  Head: Normocephalic.  Right Ear: Hearing, tympanic membrane, external ear and ear canal normal. Tympanic membrane is not erythematous, not retracted and not bulging.  Left Ear: Hearing, tympanic membrane, external ear and ear canal normal. Tympanic membrane is not erythematous, not retracted and not bulging.  Nose: No mucosal edema or rhinorrhea.  Right sinus exhibits no maxillary sinus tenderness and no frontal sinus tenderness. Left sinus exhibits no maxillary sinus tenderness and no frontal sinus tenderness.  Mouth/Throat: Uvula is midline, oropharynx is clear and moist and mucous membranes are normal.  Eyes: Conjunctivae, EOM and lids are normal. Pupils are equal, round, and reactive to light. No foreign bodies found.  Neck: Trachea normal and normal range of motion. Neck supple. Carotid bruit is not present. No mass and no thyromegaly present.  Cardiovascular: Normal rate, regular rhythm, S1 normal, S2 normal, normal heart sounds, intact distal pulses and normal pulses.  Exam reveals no gallop and no friction rub.   No murmur heard. Pulmonary/Chest: Effort normal and breath sounds normal. Not tachypneic. No respiratory distress. She has no decreased breath sounds. She has no wheezes. She has no rhonchi. She has no rales.  Abdominal: Soft. Normal appearance and bowel sounds are normal. There is no tenderness.  Neurological: She is alert.  Skin: Skin is warm, dry and intact. No rash noted.  Psychiatric: Her speech is normal and behavior is normal. Judgment and thought content normal. Her mood appears not anxious. Cognition and memory are normal. She does not exhibit a depressed mood.   Diabetic foot exam: Normal inspection No skin breakdown No calluses  Normal DP pulses Normal sensation to light touch and monofilament Nails normal        Assessment & Plan:  Smoking: Advised to Quit. Pt is precontemplative.

## 2012-07-19 NOTE — Assessment & Plan Note (Signed)
Precontemplative. Smoking cessation info given.

## 2013-01-22 ENCOUNTER — Telehealth: Payer: Self-pay | Admitting: Family Medicine

## 2013-01-22 DIAGNOSIS — E7800 Pure hypercholesterolemia, unspecified: Secondary | ICD-10-CM

## 2013-01-22 DIAGNOSIS — M810 Age-related osteoporosis without current pathological fracture: Secondary | ICD-10-CM

## 2013-01-22 DIAGNOSIS — E119 Type 2 diabetes mellitus without complications: Secondary | ICD-10-CM

## 2013-01-22 NOTE — Telephone Encounter (Signed)
Message copied by Excell Seltzer on Sun Jan 22, 2013 11:40 PM ------      Message from: Alvina Chou      Created: Wed Jan 18, 2013  3:10 PM      Regarding: Lab orders for Monday, 10.13.14       Patient is scheduled for CPX labs, please order future labs, Thanks , Terri       ------

## 2013-01-24 ENCOUNTER — Other Ambulatory Visit (INDEPENDENT_AMBULATORY_CARE_PROVIDER_SITE_OTHER): Payer: Medicare Other

## 2013-01-24 DIAGNOSIS — E7800 Pure hypercholesterolemia, unspecified: Secondary | ICD-10-CM

## 2013-01-24 DIAGNOSIS — M810 Age-related osteoporosis without current pathological fracture: Secondary | ICD-10-CM

## 2013-01-24 DIAGNOSIS — E119 Type 2 diabetes mellitus without complications: Secondary | ICD-10-CM

## 2013-01-24 LAB — COMPREHENSIVE METABOLIC PANEL
ALT: 27 U/L (ref 0–35)
AST: 31 U/L (ref 0–37)
Albumin: 4.5 g/dL (ref 3.5–5.2)
Alkaline Phosphatase: 59 U/L (ref 39–117)
BUN: 14 mg/dL (ref 6–23)
CO2: 24 mEq/L (ref 19–32)
Calcium: 10 mg/dL (ref 8.4–10.5)
Chloride: 103 mEq/L (ref 96–112)
Creatinine, Ser: 0.8 mg/dL (ref 0.4–1.2)
GFR: 80.22 mL/min (ref >60.00)
Glucose, Bld: 101 mg/dL — ABNORMAL HIGH (ref 70–99)
Potassium: 4.4 mEq/L (ref 3.5–5.1)
Sodium: 139 mEq/L (ref 135–145)
Total Bilirubin: 0.7 mg/dL (ref 0.3–1.2)
Total Protein: 7.6 g/dL (ref 6.0–8.3)

## 2013-01-24 LAB — HEMOGLOBIN A1C: Hgb A1c MFr Bld: 5.7 % (ref 4.6–6.5)

## 2013-01-24 LAB — LIPID PANEL
Cholesterol: 202 mg/dL — ABNORMAL HIGH (ref 0–200)
HDL: 64.3 mg/dL (ref >39.00)
Total CHOL/HDL Ratio: 3
Triglycerides: 136 mg/dL (ref 0.0–149.0)
VLDL: 27.2 mg/dL (ref 0.0–40.0)

## 2013-01-24 LAB — LDL CHOLESTEROL, DIRECT: Direct LDL: 118.7 mg/dL

## 2013-01-25 LAB — VITAMIN D 25 HYDROXY (VIT D DEFICIENCY, FRACTURES): Vit D, 25-Hydroxy: 58 ng/mL (ref 30–89)

## 2013-01-27 ENCOUNTER — Encounter: Payer: Self-pay | Admitting: Family Medicine

## 2013-01-27 ENCOUNTER — Ambulatory Visit (INDEPENDENT_AMBULATORY_CARE_PROVIDER_SITE_OTHER): Payer: Medicare Other | Admitting: Family Medicine

## 2013-01-27 VITALS — BP 110/60 | HR 70 | Temp 98.7°F | Ht 60.0 in | Wt 124.5 lb

## 2013-01-27 DIAGNOSIS — Z0000 Encounter for general adult medical examination without abnormal findings: Secondary | ICD-10-CM

## 2013-01-27 DIAGNOSIS — Z23 Encounter for immunization: Secondary | ICD-10-CM

## 2013-01-27 LAB — HM DIABETES EYE EXAM

## 2013-01-27 NOTE — Patient Instructions (Addendum)
Work on low fat low cholesterol diet. Get back on track with exercise. Avoid animal fats.  Set up mammogram on your own.  Quit smoking.  Work on [employer] bearing exercise.  At next appt discuss changing fosamax to a different medication.  Follow up in 6 months with labs prior.

## 2013-01-27 NOTE — Progress Notes (Signed)
I have personally reviewed the Medicare Annual Wellness questionnaire and have noted  1. The patient's medical and social history  2. Their use of alcohol, tobacco or illicit drugs  3. Their current medications and supplements  4. The patient's functional ability including ADL's, fall risks, home safety risks and hearing or visual  impairment.  5. Diet and physical activities  6. Evidence for depression or mood disorders  The patients weight, height, BMI and visual acuity have been recorded in the chart  I have made referrals, counseling and provided education to the patient based review of the above and I have provided the pt with a written personalized care plan for preventive services.   Diabetes: Well controlled on diet.  Lab Results  Component Value Date   HGBA1C 5.7 01/24/2013   Using medications without difficulties:  Hypoglycemic episodes:?  Hyperglycemic episodes:?  Feet problems: None  Blood Sugars averaging: non checking  eye exam within last year: Yes    Has not been sticking to diet over summer. Wt Readings from Last 3 Encounters:  01/27/13 124 lb 8 oz (56.473 kg)  07/19/12 117 lb (53.071 kg)  05/20/12 117 lb 12 oz (53.411 kg)   Elevated Cholesterol: Well controlled (LDL almost l <100) on lipitor 20 mg daily  Lab Results  Component Value Date   CHOL 202* 01/24/2013   HDL 64.30 01/24/2013   LDLCALC 85 07/15/2012   LDLDIRECT 118.7 01/24/2013   TRIG 136.0 01/24/2013   CHOLHDL 3 01/24/2013   Using medications without problems: Yes  Muscle aches: NONE  Diet compliance: not eating as well. Exercise: Walking less than previously. Other complaints:   Review of Systems  Constitutional: Negative for fever, fatigue and unexpected weight change.  HENT: No trouble swallowing and sinus pressure.  Eyes: Negative for pain and itching.  Respiratory:see above  Cardiovascular: Negative for chest pain, palpitations and leg swelling.  Gastrointestinal: Negative for nausea,  abdominal pain, diarrhea, constipation and blood in stool.  Genitourinary: Negative for dysuria, hematuria, vaginal bleeding, vaginal discharge, difficulty urinating and menstrual problem.  Skin: Negative for rash.  Neurological: Negative for syncope, weakness, light-headedness, numbness and headaches.  Psychiatric/Behavioral: Negative for confusion and dysphoric mood. The patient is not nervous/anxious.  Objective:   Physical Exam  Constitutional: Vital signs are normal. She appears well-developed and well-nourished. She is cooperative. Non-toxic appearance. She does not appear ill. No distress.  HENT:  Head: Normocephalic.  Right Ear: Hearing, tympanic membrane, external ear and ear canal normal.  Left Ear: Hearing, tympanic membrane, external ear and ear canal normal.  Nose: Nose normal.  Eyes: Conjunctivae, EOM and lids are normal. Pupils are equal, round, and reactive to light. No foreign bodies found.  Neck: Trachea normal and normal range of motion. Neck supple. Carotid bruit is not present. No mass and no thyromegaly present.  Cardiovascular: Normal rate, regular rhythm, S1 normal, S2 normal, normal heart sounds and intact distal pulses. Exam reveals no gallop.  No murmur heard.  Pulmonary/Chest: Effort normal and breath sounds normal. No respiratory distress. She has no wheezes. She has no rhonchi. She has no rales.  Abdominal: Soft. Normal appearance and bowel sounds are normal. She exhibits no distension, no fluid wave, no abdominal bruit and no mass. There is no hepatosplenomegaly. There is no tenderness. There is no rebound, no guarding and no CVA tenderness. No hernia.  Genitourinary: No breast swelling, tenderness, discharge or bleeding. Pelvic exam was NO DVE THIS YEAR. Lymphadenopathy:  She has no cervical adenopathy.  She has no axillary adenopathy.  Neurological: She is alert. She has normal strength. No cranial nerve deficit or sensory deficit.  Skin: Skin is warm, dry and  intact. No rash noted.  Psychiatric: Her speech is normal and behavior is normal. Judgment normal. Her mood appears not anxious. Cognition and memory are normal. She does not exhibit a depressed mood.   Diabetic foot exam:  Normal inspection  No skin breakdown  No calluses  Normal DP pulses  Normal sensation to light touch and monofilament  Nails normal  Assessment & Plan:   AMW:The patient's preventative maintenance and recommended screening tests for an annual wellness exam were reviewed in full today.  Brought up to date unless services declined.  Counselled on the importance of diet, exercise, and its role in overall health and mortality.  The patient's FH and SH was reviewed, including their home life, tobacco status, and drug and alcohol status.   Mammogram due. Last DEXA 2013,  Worsened to osteoporosis on fosamax (has been on 2 years) At next appt with discuss changing given fosamax not effective. Colonoscopy: 10/2009... Adenoma.. Repeat in 5 years 2016  Up to date with PNA and tdap. Shingles--looking into, Flu: given today  DVE/pap: nml pap 2011, no pap indicated. No family hx of uterine or ovarian ca. Asymptomatic. Plan to do DVE every 2 years, due 2015 Smoker: only 1/2 pack a day. Has been smoking 40 pack years. Nml CXR in 2008, will repeat today. Running late will hold on spirometry today.

## 2013-02-11 ENCOUNTER — Other Ambulatory Visit: Payer: Self-pay | Admitting: Family Medicine

## 2013-02-15 ENCOUNTER — Other Ambulatory Visit: Payer: Self-pay

## 2013-02-15 DIAGNOSIS — Z1231 Encounter for screening mammogram for malignant neoplasm of breast: Secondary | ICD-10-CM

## 2013-03-03 ENCOUNTER — Other Ambulatory Visit: Payer: Self-pay | Admitting: Family Medicine

## 2013-03-21 ENCOUNTER — Ambulatory Visit: Payer: Medicare Other

## 2013-03-22 ENCOUNTER — Ambulatory Visit
Admission: RE | Admit: 2013-03-22 | Discharge: 2013-03-22 | Disposition: A | Discharge status: Home / Self Care | Payer: Medicare Other | Source: Ambulatory Visit

## 2013-03-22 DIAGNOSIS — Z1231 Encounter for screening mammogram for malignant neoplasm of breast: Secondary | ICD-10-CM

## 2013-05-10 ENCOUNTER — Other Ambulatory Visit: Payer: Self-pay | Admitting: Family Medicine

## 2013-06-13 ENCOUNTER — Other Ambulatory Visit: Payer: Self-pay | Admitting: Family Medicine

## 2013-07-21 ENCOUNTER — Other Ambulatory Visit (INDEPENDENT_AMBULATORY_CARE_PROVIDER_SITE_OTHER): Payer: Medicare Other

## 2013-07-21 ENCOUNTER — Telehealth: Payer: Self-pay | Admitting: Family Medicine

## 2013-07-21 DIAGNOSIS — M810 Age-related osteoporosis without current pathological fracture: Secondary | ICD-10-CM

## 2013-07-21 DIAGNOSIS — R809 Proteinuria, unspecified: Secondary | ICD-10-CM

## 2013-07-21 DIAGNOSIS — E7800 Pure hypercholesterolemia, unspecified: Secondary | ICD-10-CM

## 2013-07-21 DIAGNOSIS — E119 Type 2 diabetes mellitus without complications: Secondary | ICD-10-CM

## 2013-07-21 LAB — LIPID PANEL
Cholesterol: 184 mg/dL (ref 0–200)
HDL: 68.7 mg/dL (ref >39.00)
LDL Cholesterol: 91 mg/dL (ref 0–99)
Total CHOL/HDL Ratio: 3
Triglycerides: 124 mg/dL (ref 0.0–149.0)
VLDL: 24.8 mg/dL (ref 0.0–40.0)

## 2013-07-21 LAB — COMPREHENSIVE METABOLIC PANEL
ALT: 25 U/L (ref 0–35)
AST: 31 U/L (ref 0–37)
Albumin: 4.2 g/dL (ref 3.5–5.2)
Alkaline Phosphatase: 63 U/L (ref 39–117)
BUN: 17 mg/dL (ref 6–23)
CO2: 25 mEq/L (ref 19–32)
Calcium: 10.6 mg/dL — ABNORMAL HIGH (ref 8.4–10.5)
Chloride: 104 mEq/L (ref 96–112)
Creatinine, Ser: 0.8 mg/dL (ref 0.4–1.2)
GFR: 78.91 mL/min (ref >60.00)
Glucose, Bld: 110 mg/dL — ABNORMAL HIGH (ref 70–99)
Potassium: 4 mEq/L (ref 3.5–5.1)
Sodium: 140 mEq/L (ref 135–145)
Total Bilirubin: 0.7 mg/dL (ref 0.3–1.2)
Total Protein: 7 g/dL (ref 6.0–8.3)

## 2013-07-21 LAB — HEMOGLOBIN A1C: Hgb A1c MFr Bld: 5.7 % (ref 4.6–6.5)

## 2013-07-21 LAB — MICROALBUMIN / CREATININE URINE RATIO
Creatinine,U: 30.4 mg/dL
Microalb Creat Ratio: 1 mg/g (ref 0.0–30.0)
Microalb, Ur: 0.3 mg/dL (ref 0.0–1.9)

## 2013-07-21 NOTE — Telephone Encounter (Signed)
Message copied by [name] on Fri Jul 21, 2013  9:38 AM ------      Message from: Ellamae Sia      Created: Thu Jul 13, 2013 11:14 AM      Regarding: Lab orders for Friday,4.10.15       Lab orders for a 6 month f/u ------

## 2013-07-21 NOTE — Addendum Note (Signed)
Addended by: Ellamae Sia on: 07/21/2013 12:14 PM   Modules accepted: Orders

## 2013-07-22 LAB — VITAMIN D 25 HYDROXY (VIT D DEFICIENCY, FRACTURES): Vit D, 25-Hydroxy: 80 ng/mL (ref 30–89)

## 2013-07-28 ENCOUNTER — Encounter: Payer: Self-pay | Admitting: Family Medicine

## 2013-07-28 ENCOUNTER — Ambulatory Visit (INDEPENDENT_AMBULATORY_CARE_PROVIDER_SITE_OTHER): Payer: Medicare Other | Admitting: Family Medicine

## 2013-07-28 ENCOUNTER — Other Ambulatory Visit: Payer: Self-pay | Admitting: Family Medicine

## 2013-07-28 VITALS — BP 120/62 | HR 85 | Temp 98.6°F | Ht 60.0 in | Wt 123.0 lb

## 2013-07-28 DIAGNOSIS — E7800 Pure hypercholesterolemia, unspecified: Secondary | ICD-10-CM

## 2013-07-28 DIAGNOSIS — M810 Age-related osteoporosis without current pathological fracture: Secondary | ICD-10-CM

## 2013-07-28 DIAGNOSIS — IMO0002 Reserved for concepts with insufficient information to code with codable children: Secondary | ICD-10-CM

## 2013-07-28 DIAGNOSIS — Z1231 Encounter for screening mammogram for malignant neoplasm of breast: Secondary | ICD-10-CM

## 2013-07-28 DIAGNOSIS — L02413 Cutaneous abscess of right upper limb: Secondary | ICD-10-CM | POA: Insufficient documentation

## 2013-07-28 DIAGNOSIS — E119 Type 2 diabetes mellitus without complications: Secondary | ICD-10-CM

## 2013-07-28 LAB — HM DIABETES FOOT EXAM

## 2013-07-28 MED ORDER — DOXYCYCLINE HYCLATE 100 MG PO CAPS: 100.0000 mg | ORAL_CAPSULE | Freq: Two times a day (BID) | ORAL | Status: DC

## 2013-07-28 NOTE — Progress Notes (Signed)
68 year old female presents for 6 month follow up. She is doing well overall.  She has skin lesion on right shoulder in last 1-2 weeks. Tender, red, warm. She started warm compresses. Drained pus ( yellow) yesterday. No fever. No joint pain. She has had similar last year, no past I and D, wound culture 05/2012 neg.  Diabetes: Well controlled on diet.  Lab Results  Component Value Date   HGBA1C 5.7 07/21/2013  Using medications without difficulties:  Hypoglycemic episodes:?  Hyperglycemic episodes:?  Feet problems: None  Blood Sugars averaging: non checking  eye exam within last year: Yes  Wt Readings from Last 3 Encounters:  07/28/13 123 lb (55.792 kg)  01/27/13 124 lb 8 oz (56.473 kg)  07/19/12 117 lb (53.071 kg)    Elevated Cholesterol: Well controlled (LDL at goal <100) on lipitor 20 mg daily  Lab Results  Component Value Date   CHOL 184 07/21/2013   HDL 68.70 07/21/2013   LDLCALC 91 07/21/2013   LDLDIRECT 118.7 01/24/2013   TRIG 124.0 07/21/2013   CHOLHDL 3 07/21/2013    Using medications without problems: Yes  Muscle aches: NONE  Diet compliance: not eating as well.  Exercise: Walking less than previously.   Osteoporosis: Ca and Vit D Likely failure of fosamax with worsening bone density scores T 2011 -2.2, then 2013 -2.5  Due for re-peat bone density.   Review of Systems  Constitutional: Negative for fever, fatigue and unexpected weight change.  HENT: No trouble swallowing and sinus pressure.  Eyes: Negative for pain and itching.  Respiratory:see above  Cardiovascular: Negative for chest pain, palpitations and leg swelling.  Gastrointestinal: Negative for nausea, abdominal pain, diarrhea, constipation and blood in stool.  Genitourinary: Negative for dysuria, hematuria, vaginal bleeding, vaginal discharge, difficulty urinating and menstrual problem.  Skin: Negative for rash.  Neurological: Negative for syncope, weakness, light-headedness, numbness and  headaches.  Psychiatric/Behavioral: Negative for confusion and dysphoric mood. The patient is not nervous/anxious.  Objective:   Physical Exam  Constitutional: Vital signs are normal. She appears well-developed and well-nourished. She is cooperative. Non-toxic appearance. She does not appear ill. No distress.  HENT:  Head: Normocephalic.  Right Ear: Hearing, tympanic membrane, external ear and ear canal normal.  Left Ear: Hearing, tympanic membrane, external ear and ear canal normal.  Nose: Nose normal.  Eyes: Conjunctivae, EOM and lids are normal. Pupils are equal, round, and reactive to light. No foreign bodies found.  Neck: Trachea normal and normal range of motion. Neck supple. Carotid bruit is not present. No mass and no thyromegaly present.  Cardiovascular: Normal rate, regular rhythm, S1 normal, S2 normal, normal heart sounds and intact distal pulses. Exam reveals no gallop.  No murmur heard.  Pulmonary/Chest: Effort normal and breath sounds normal. No respiratory distress. She has no wheezes. She has no rhonchi. She has no rales.  Abdominal: Soft. Normal appearance and bowel sounds are normal. She exhibits no distension, no fluid wave, no abdominal bruit and no mass. There is no hepatosplenomegaly. There is no tenderness. There is no rebound, no guarding and no CVA tenderness. No hernia.  Genitourinary: No breast swelling, tenderness, discharge or bleeding. Pelvic exam was NO DVE THIS YEAR.  Lymphadenopathy:  She has no cervical adenopathy.  She has no axillary adenopathy.  Neurological: She is alert. She has normal strength. No cranial nerve deficit or sensory deficit.  Skin: Skin is warm, dry  vculture obtained from abcess 1 cm on right outer arm, draining actively. Psychiatric: Her  speech is normal and behavior is normal. Judgment normal. Her mood appears not anxious. Cognition and memory are normal. She does not exhibit a depressed mood.   Diabetic foot exam:  Normal inspection   No skin breakdown  No calluses  Normal DP pulses  Normal sensation to light touch and monofilament  Nails normal

## 2013-07-28 NOTE — Assessment & Plan Note (Signed)
No indication for I and D.  culture obtained.  Treat with doxy BID and warm compresses.

## 2013-07-28 NOTE — Assessment & Plan Note (Signed)
Increased ca supplement lately to 2 a day. Instructed to hold or at least decrease. Recheck at CPX labs.

## 2013-07-28 NOTE — Assessment & Plan Note (Signed)
Well controlled. Continue current medication.  

## 2013-07-28 NOTE — Patient Instructions (Addendum)
Stop calcium supplement or at least go back to 1 tab a day. Stop at front desk to set up bone density.  Continue warm compresses 3 times daily. Start and complete antibiotics. Call if redness spreading or not improving in 72 hours as expected. Schedule CPX with labs prior in 1 year.

## 2013-07-28 NOTE — Addendum Note (Signed)
Addended by: Carter Kitten on: 07/28/2013 11:49 AM   Modules accepted: Orders

## 2013-07-28 NOTE — Assessment & Plan Note (Signed)
Well controlled with diet.

## 2013-07-28 NOTE — Progress Notes (Signed)
Pre visit review using our clinic review tool, if applicable. No additional management support is needed unless otherwise documented below in the visit note. 

## 2013-07-28 NOTE — Assessment & Plan Note (Addendum)
previous DEXA worsening despite  3 years of fosamax. Due for repeat, if continue worsening, change to prolia or other med.

## 2013-07-29 ENCOUNTER — Telehealth: Payer: Self-pay | Admitting: Family Medicine

## 2013-07-29 NOTE — Telephone Encounter (Signed)
Relevant patient education mailed to patient.  

## 2013-07-30 LAB — WOUND CULTURE
Gram Stain: NONE SEEN
Gram Stain: NONE SEEN
Organism ID, Bacteria: NO GROWTH

## 2013-08-04 ENCOUNTER — Telehealth: Payer: Self-pay

## 2013-08-04 NOTE — Telephone Encounter (Signed)
Relevant patient education mailed to patient.  

## 2013-08-07 ENCOUNTER — Other Ambulatory Visit: Payer: Self-pay | Admitting: Family Medicine

## 2013-09-01 ENCOUNTER — Other Ambulatory Visit: Payer: Self-pay | Admitting: Family Medicine

## 2014-01-09 ENCOUNTER — Encounter: Payer: Self-pay | Admitting: Gastroenterology

## 2014-02-05 ENCOUNTER — Other Ambulatory Visit: Payer: Self-pay | Admitting: Family Medicine

## 2014-02-19 ENCOUNTER — Encounter: Payer: Self-pay | Admitting: Family Medicine

## 2014-02-19 ENCOUNTER — Ambulatory Visit (INDEPENDENT_AMBULATORY_CARE_PROVIDER_SITE_OTHER): Payer: Medicare Other | Admitting: Family Medicine

## 2014-02-19 VITALS — BP 120/64 | HR 66 | Temp 98.3°F | Ht 60.0 in | Wt 129.5 lb

## 2014-02-19 DIAGNOSIS — L02413 Cutaneous abscess of right upper limb: Secondary | ICD-10-CM

## 2014-02-19 MED ORDER — DOXYCYCLINE HYCLATE 100 MG PO TABS: 100.0000 mg | ORAL_TABLET | Freq: Two times a day (BID) | ORAL | Status: DC

## 2014-02-19 NOTE — Progress Notes (Signed)
Dr. Frederico Hamman T. Loreda Silverio, MD, [facility] [place], [address] Phone: [phone] Fax: [phone]  02/19/2014  Patient: Katelyn Kennedy, MRN: [id number], DOB: 1945/04/27, 68 y.o.  Primary Physician:  Eliezer Lofts, MD  Chief Complaint: Abcess on Shoulder  Subjective:   Katelyn Kennedy is a 68 y.o. very pleasant female patient who presents with the following:  R shoulder abcess:  Drained on Saturday, then it took off and opened when she was in the shower on Saturday and drained off and on for the past couple of days. She has had an abscess in this area previously, and she was treated with doxycycline for this.  She is not having any fevers, chills, or other, symptomatic systemic symptoms.  She is eating and drinking normally without any other complications.  Past Medical History, Surgical History, Social History, Family History, Problem List, Medications, and Allergies have been reviewed and updated if relevant.  ROS: GEN: Acute illness details above GI: Tolerating PO intake GU: maintaining adequate hydration and urination Pulm: No SOB Interactive and getting along well at home.  Otherwise, ROS is as per the HPI.   Objective:   BP 120/64 mmHg  Pulse 66  Temp(Src) 98.3 F (36.8 C) (Oral)  Ht 5' (1.524 m)  Wt 129 lb 8 oz (58.741 kg)  BMI 25.29 kg/m2   GEN: WDWN, NAD, Non-toxic, Alert & Oriented x 3 HEENT: Atraumatic, Normocephalic.  Ears and Nose: No external deformity. EXTR: No clubbing/cyanosis/edema NEURO: Normal gait.  PSYCH: Normally interactive. Conversant. Not depressed or anxious appearing.  Calm demeanor.    On the posterior RIGHT shoulder there is an area of his approximately 1 inch across that is darker in color and mildly red without any clear warmth.  There is a central opening, and I am able to express some pus from this area.  Total amount of express pus is less than 2 mL.   Laboratory and  Imaging Data:  Assessment and Plan:   Abscess of right shoulder - Plan: Wound culture  I expressed a small amount of pus on the posterior RIGHT shoulder, and we will send this off for culture.  I am going to place the patient on doxycycline, to ensure coverage for MRSA.  She knows to return and seek further medical care for symptoms worsen.  Follow-up: No Follow-up on file.  New Prescriptions   DOXYCYCLINE (VIBRA-TABS) 100 MG TABLET    Take 1 tablet (100 mg total) by mouth 2 (two) times daily.   Orders Placed This Encounter  Procedures  . Wound culture    Signed,  Frederico Hamman T. Kohle Winner, MD   Patient's Medications  New Prescriptions   DOXYCYCLINE (VIBRA-TABS) 100 MG TABLET    Take 1 tablet (100 mg total) by mouth 2 (two) times daily.  Previous Medications   ALENDRONATE (FOSAMAX) 70 MG TABLET    TAKE 1 TABLET BY MOUTH EVERY WEEK ON THE SAME DAY. TAKE WITH A FULL GLASS OF WATER ON AN EMPTY STOMA   ASPIRIN 81 MG TABLET    Take 81 mg by mouth daily.     ATORVASTATIN (LIPITOR) 20 MG TABLET    TAKE 1 TABLET (20 MG TOTAL) BY MOUTH DAILY.   CALCIUM CARBONATE-VITAMIN D (CALCIUM + D) 600-200 MG-UNIT PER TABLET    Take 1 tablet by mouth 2 (two) times daily.     FLUTICASONE (FLONASE) 50 MCG/ACT NASAL SPRAY    PLACE 2 SPRAYS INTO THE  NOSE DAILY.   LISINOPRIL (PRINIVIL,ZESTRIL) 5 MG TABLET    TAKE 1/2 TABLET BY MOUTH DAILY   MULTIPLE VITAMIN (MULTIVITAMIN) CAPSULE    Take 1 capsule by mouth daily.     ZOSTAVAX 84665 UNT/0.65ML INJECTION      Modified Medications   No medications on file  Discontinued Medications   AFLURIA PRESERVATIVE FREE 0.5 ML SUSY       DOXYCYCLINE (VIBRAMYCIN) 100 MG CAPSULE    Take 1 capsule (100 mg total) by mouth 2 (two) times daily.

## 2014-02-19 NOTE — Progress Notes (Signed)
Pre visit review using our clinic review tool, if applicable. No additional management support is needed unless otherwise documented below in the visit note. 

## 2014-02-22 ENCOUNTER — Other Ambulatory Visit: Payer: Self-pay | Admitting: Family Medicine

## 2014-02-22 LAB — WOUND CULTURE
Gram Stain: NONE SEEN
Gram Stain: NONE SEEN

## 2014-03-26 ENCOUNTER — Ambulatory Visit: Payer: Medicare Other

## 2014-03-26 ENCOUNTER — Other Ambulatory Visit: Payer: Medicare Other

## 2014-03-26 ENCOUNTER — Ambulatory Visit (INDEPENDENT_AMBULATORY_CARE_PROVIDER_SITE_OTHER): Payer: Medicare Other | Admitting: Family Medicine

## 2014-03-26 ENCOUNTER — Encounter: Payer: Self-pay | Admitting: Family Medicine

## 2014-03-26 VITALS — BP 140/60 | HR 91 | Temp 98.5°F | Ht 60.0 in | Wt 128.5 lb

## 2014-03-26 DIAGNOSIS — L309 Dermatitis, unspecified: Secondary | ICD-10-CM

## 2014-03-26 MED ORDER — PREDNISONE 20 MG PO TABS: 20.0000 mg | ORAL_TABLET | Freq: Every day | ORAL | Status: DC

## 2014-03-26 MED ORDER — TRIAMCINOLONE ACETONIDE 0.1 % EX CREA: 1.0000 "application " | TOPICAL_CREAM | Freq: Two times a day (BID) | CUTANEOUS | Status: DC

## 2014-03-26 NOTE — Progress Notes (Signed)
Dr. Frederico Hamman T. Katlin Bortner, MD, [facility] [place], [address] Phone: [phone] Fax: [phone]  03/26/2014  Patient: Katelyn Kennedy, MRN: [id number], DOB: Mar 12, 1946, 68 y.o.  Primary Physician:  Eliezer Lofts, MD  Chief Complaint: Rash  Subjective:   Katelyn Kennedy is a 68 y.o. very pleasant female patient who presents with the following:  ? Had her hair colored and then got a rash.  Never had a problem in 8 years.   Lisinopril week the patient has had a mildly pruritic rash on the anterior aspect of her chest as well as in her posterior upper back. Is also does go up her neck a little bit. She has tried some topical Lidex as well as some cortisone.  Past Medical History, Surgical History, Social History, Family History, Problem List, Medications, and Allergies have been reviewed and updated if relevant.  ROS: GEN: Acute illness details above GI: Tolerating PO intake GU: maintaining adequate hydration and urination Pulm: No SOB Interactive and getting along well at home.  Otherwise, ROS is as per the HPI.   Objective:   BP 140/60 mmHg  Pulse 91  Temp(Src) 98.5 F (36.9 C) (Oral)  Ht 5' (1.524 m)  Wt 128 lb 8 oz (58.287 kg)  BMI 25.10 kg/m2   GEN: WDWN, NAD, Non-toxic, Alert & Oriented x 3 HEENT: Atraumatic, Normocephalic.  Ears and Nose: No external deformity. EXTR: No clubbing/cyanosis/edema NEURO: Normal gait.  PSYCH: Normally interactive. Conversant. Not depressed or anxious appearing.  Calm demeanor.    Scattered macular rash with some elevation and some evidence of excoriation on her chest as well as her back.   Laboratory and Imaging Data:  Assessment and Plan:   Dermatitis  Use triamcinolone, and this does not see any improvement in the next 48 hours then start oral prednisone.  Follow-up: No Follow-up on file.  New Prescriptions   PREDNISONE (DELTASONE) 20 MG TABLET    Take 1  tablet (20 mg total) by mouth daily with breakfast.   TRIAMCINOLONE CREAM (KENALOG) 0.1 %    Apply 1 application topically 2 (two) times daily.   No orders of the defined types were placed in this encounter.    Signed,  Maud Deed. Alani Sabbagh, MD   Patient's Medications  New Prescriptions   PREDNISONE (DELTASONE) 20 MG TABLET    Take 1 tablet (20 mg total) by mouth daily with breakfast.   TRIAMCINOLONE CREAM (KENALOG) 0.1 %    Apply 1 application topically 2 (two) times daily.  Previous Medications   ALENDRONATE (FOSAMAX) 70 MG TABLET    TAKE 1 TABLET BY MOUTH EVERY WEEK ON THE SAME DAY. TAKE WITH A FULL GLASS OF WATER ON AN EMPTY STOMA   ASPIRIN 81 MG TABLET    Take 81 mg by mouth daily.     ATORVASTATIN (LIPITOR) 20 MG TABLET    TAKE 1 TABLET (20 MG TOTAL) BY MOUTH DAILY.   CALCIUM CARBONATE-VITAMIN D (CALCIUM + D) 600-200 MG-UNIT PER TABLET    Take 1 tablet by mouth 2 (two) times daily.     FLUTICASONE (FLONASE) 50 MCG/ACT NASAL SPRAY    PLACE 2 SPRAYS INTO THE NOSE DAILY.   LISINOPRIL (PRINIVIL,ZESTRIL) 5 MG TABLET    TAKE 1/2 TABLET BY MOUTH DAILY   MULTIPLE VITAMIN (MULTIVITAMIN) CAPSULE    Take 1 capsule by mouth daily.     ZOSTAVAX 93716 UNT/0.65ML INJECTION      Modified  Medications   No medications on file  Discontinued Medications   DOXYCYCLINE (VIBRA-TABS) 100 MG TABLET    Take 1 tablet (100 mg total) by mouth 2 (two) times daily.

## 2014-03-26 NOTE — Progress Notes (Signed)
Pre visit review using our clinic review tool, if applicable. No additional management support is needed unless otherwise documented below in the visit note. 

## 2014-05-23 ENCOUNTER — Ambulatory Visit
Admission: RE | Admit: 2014-05-23 | Discharge: 2014-05-23 | Disposition: A | Discharge status: Home / Self Care | Payer: Medicare Other | Source: Ambulatory Visit | Attending: Family Medicine | Admitting: Family Medicine

## 2014-05-23 DIAGNOSIS — M810 Age-related osteoporosis without current pathological fracture: Secondary | ICD-10-CM

## 2014-05-23 DIAGNOSIS — Z1231 Encounter for screening mammogram for malignant neoplasm of breast: Secondary | ICD-10-CM | POA: Diagnosis not present

## 2014-06-03 ENCOUNTER — Other Ambulatory Visit: Payer: Self-pay | Admitting: Family Medicine

## 2014-07-26 ENCOUNTER — Telehealth: Payer: Self-pay | Admitting: Family Medicine

## 2014-07-26 DIAGNOSIS — E119 Type 2 diabetes mellitus without complications: Secondary | ICD-10-CM

## 2014-07-26 DIAGNOSIS — H35373 Puckering of macula, bilateral: Secondary | ICD-10-CM | POA: Diagnosis not present

## 2014-07-26 DIAGNOSIS — R809 Proteinuria, unspecified: Secondary | ICD-10-CM

## 2014-07-26 DIAGNOSIS — E7800 Pure hypercholesterolemia, unspecified: Secondary | ICD-10-CM

## 2014-07-26 DIAGNOSIS — M810 Age-related osteoporosis without current pathological fracture: Secondary | ICD-10-CM

## 2014-07-26 DIAGNOSIS — H52223 Regular astigmatism, bilateral: Secondary | ICD-10-CM | POA: Diagnosis not present

## 2014-07-26 LAB — HM DIABETES EYE EXAM

## 2014-07-26 NOTE — Telephone Encounter (Signed)
-----   Message from Ellamae Sia sent at 07/20/2014 12:01 PM EDT ----- Regarding: Lab orders for Friday, 4.15.16 Patient is scheduled for CPX labs, please order future labs, Thanks , Karna Christmas

## 2014-07-27 ENCOUNTER — Other Ambulatory Visit (INDEPENDENT_AMBULATORY_CARE_PROVIDER_SITE_OTHER): Payer: Medicare Other

## 2014-07-27 DIAGNOSIS — E7800 Pure hypercholesterolemia, unspecified: Secondary | ICD-10-CM

## 2014-07-27 DIAGNOSIS — E119 Type 2 diabetes mellitus without complications: Secondary | ICD-10-CM | POA: Diagnosis not present

## 2014-07-27 DIAGNOSIS — M810 Age-related osteoporosis without current pathological fracture: Secondary | ICD-10-CM | POA: Diagnosis not present

## 2014-07-27 LAB — COMPREHENSIVE METABOLIC PANEL
ALT: 22 U/L (ref 0–35)
AST: 24 U/L (ref 0–37)
Albumin: 4.4 g/dL (ref 3.5–5.2)
Alkaline Phosphatase: 76 U/L (ref 39–117)
BUN: 14 mg/dL (ref 6–23)
CO2: 26 mEq/L (ref 19–32)
Calcium: 10.5 mg/dL (ref 8.4–10.5)
Chloride: 103 mEq/L (ref 96–112)
Creatinine, Ser: 0.76 mg/dL (ref 0.40–1.20)
GFR: 79.87 mL/min (ref >60.00)
Glucose, Bld: 103 mg/dL — ABNORMAL HIGH (ref 70–99)
Potassium: 4.4 mEq/L (ref 3.5–5.1)
Sodium: 137 mEq/L (ref 135–145)
Total Bilirubin: 0.5 mg/dL (ref 0.2–1.2)
Total Protein: 7.4 g/dL (ref 6.0–8.3)

## 2014-07-27 LAB — LIPID PANEL
Cholesterol: 192 mg/dL (ref 0–200)
HDL: 55.8 mg/dL (ref >39.00)
LDL Cholesterol: 97 mg/dL (ref 0–99)
NonHDL: 136.2
Total CHOL/HDL Ratio: 3
Triglycerides: 197 mg/dL — ABNORMAL HIGH (ref 0.0–149.0)
VLDL: 39.4 mg/dL (ref 0.0–40.0)

## 2014-07-27 LAB — VITAMIN D 25 HYDROXY (VIT D DEFICIENCY, FRACTURES): VITD: 41.08 ng/mL (ref 30.00–100.00)

## 2014-07-27 LAB — HEMOGLOBIN A1C: Hgb A1c MFr Bld: 5.7 % (ref 4.6–6.5)

## 2014-07-31 ENCOUNTER — Ambulatory Visit (INDEPENDENT_AMBULATORY_CARE_PROVIDER_SITE_OTHER): Payer: Medicare Other | Admitting: Family Medicine

## 2014-07-31 ENCOUNTER — Encounter: Payer: Self-pay | Admitting: Family Medicine

## 2014-07-31 VITALS — BP 133/66 | HR 68 | Temp 98.8°F | Ht 59.5 in | Wt 127.8 lb

## 2014-07-31 DIAGNOSIS — Z23 Encounter for immunization: Secondary | ICD-10-CM | POA: Diagnosis not present

## 2014-07-31 DIAGNOSIS — Z7189 Other specified counseling: Secondary | ICD-10-CM

## 2014-07-31 DIAGNOSIS — E119 Type 2 diabetes mellitus without complications: Secondary | ICD-10-CM

## 2014-07-31 DIAGNOSIS — E7800 Pure hypercholesterolemia, unspecified: Secondary | ICD-10-CM

## 2014-07-31 DIAGNOSIS — Z0000 Encounter for general adult medical examination without abnormal findings: Secondary | ICD-10-CM | POA: Diagnosis not present

## 2014-07-31 LAB — HM DIABETES FOOT EXAM

## 2014-07-31 MED ORDER — AZELASTINE-FLUTICASONE 137-50 MCG/ACT NA SUSP: NASAL | Status: DC

## 2014-07-31 NOTE — Assessment & Plan Note (Signed)
Well controlled. Continue current medication. Encouraged exercise, weight loss, healthy eating habits.  

## 2014-07-31 NOTE — Progress Notes (Signed)
Pre visit review using our clinic review tool, if applicable. No additional management support is needed unless otherwise documented below in the visit note. 

## 2014-07-31 NOTE — Assessment & Plan Note (Signed)
Well controlled. Continue current diet and exercise.

## 2014-07-31 NOTE — Progress Notes (Signed)
have personally reviewed the Medicare Annual Wellness questionnaire and have noted  1. The patient's medical and social history  2. Their use of alcohol, tobacco or illicit drugs  3. Their current medications and supplements  4. The patient's functional ability including ADL's, fall risks, home safety risks and hearing or visual  impairment.  5. Diet and physical activities  6. Evidence for depression or mood disorders  The patients weight, height, BMI and visual acuity have been recorded in the chart  I have made referrals, counseling and provided education to the patient based review of the above and I have provided the pt with a written personalized care plan for preventive services.   Has mucus in nose, stopped up, wakes her up at night. Occ sneeze. Ongoing for years about 5 . Started after temporary swelling in left neck 10 years ago.  Tried flonase, helped with allergies not with nasal congestion. Tried netty pot.   Diabetes: Well controlled on diet.  Lab Results  Component Value Date   HGBA1C 5.7 07/27/2014  Using medications without difficulties:  Hypoglycemic episodes:?  Hyperglycemic episodes:?  Feet problems: None  Blood Sugars averaging: non checking ,  94 at mothers over weekend eye exam within last year: Yes   Wt Readings from Last 3 Encounters:  07/31/14 127 lb 12 oz (57.947 kg)  03/26/14 128 lb 8 oz (58.287 kg)  02/19/14 129 lb 8 oz (58.741 kg)   Elevated Cholesterol: Well controlled (at goal <100) on lipitor 20 mg daily  Lab Results  Component Value Date   CHOL 192 07/27/2014   HDL 55.80 07/27/2014   LDLCALC 97 07/27/2014   LDLDIRECT 118.7 01/24/2013   TRIG 197.0* 07/27/2014   CHOLHDL 3 07/27/2014  Using medications without problems: Yes  Muscle aches: NONE  Diet compliance:  Issues with portion control. Exercise: Walking occ in house, no regular outside Other complaints:   Review of Systems  Constitutional: Negative for fever,  fatigue and unexpected weight change.  HENT: No trouble swallowing and sinus pressure.  Eyes: Negative for pain and itching.  Respiratory:see above  Cardiovascular: Negative for chest pain, palpitations and leg swelling.  Gastrointestinal: Negative for nausea, abdominal pain, diarrhea, constipation and blood in stool.  Genitourinary: Negative for dysuria, hematuria, vaginal bleeding, vaginal discharge, difficulty urinating and menstrual problem.  Skin: Negative for rash.  Neurological: Negative for syncope, weakness, light-headedness, numbness and headaches.  Psychiatric/Behavioral: Negative for confusion and dysphoric mood. The patient is not nervous/anxious.  Objective:   Physical Exam  Constitutional: Vital signs are normal. She appears well-developed and well-nourished. She is cooperative. Non-toxic appearance. She does not appear ill. No distress.  HENT:  Head: Normocephalic.  Right Ear: Hearing, tympanic membrane, external ear and ear canal normal.  Left Ear: Hearing, tympanic membrane, external ear and ear canal normal.  Nose: Nose normal.  Eyes: Conjunctivae, EOM and lids are normal. Pupils are equal, round, and reactive to light. No foreign bodies found.  Neck: Trachea normal and normal range of motion. Neck supple. Carotid bruit is not present. No mass and no thyromegaly present.  Cardiovascular: Normal rate, regular rhythm, S1 normal, S2 normal, normal heart sounds and intact distal pulses. Exam reveals no gallop.  No murmur heard.  Pulmonary/Chest: Effort normal and breath sounds normal. No respiratory distress. She has no wheezes. She has no rhonchi. She has no rales.  Abdominal: Soft. Normal appearance and bowel sounds are normal. She exhibits no distension, no fluid wave, no abdominal bruit and no mass. There  is no hepatosplenomegaly. There is no tenderness. There is no rebound, no guarding and no CVA tenderness. No hernia.  Genitourinary: No breast  swelling, tenderness, discharge or bleeding. Pelvic exam was NO DVE  Lymphadenopathy:  She has no cervical adenopathy.  She has no axillary adenopathy.  Neurological: She is alert. She has normal strength. No cranial nerve deficit or sensory deficit.  Skin: Skin is warm, dry and intact. No rash noted.  Psychiatric: Her speech is normal and behavior is normal. Judgment normal. Her mood appears not anxious. Cognition and memory are normal. She does not exhibit a depressed mood.   Diabetic foot exam:  Normal inspection  No skin breakdown  No calluses  Normal DP pulses  Normal sensation to light touch and monofilament  Nails normal  Assessment & Plan:   AMW:The patient's preventative maintenance and recommended screening tests for an annual wellness exam were reviewed in full today.  Brought up to date unless services declined.  Counselled on the importance of diet, exercise, and its role in overall health and mortality.  The patient's FH and SH was reviewed, including their home life, tobacco status, and drug and alcohol status.   Mammogram 05/2014  nml Last DEXA improved to osteopenia on fosamax  05/2014 Colonoscopy: 10/2009... Adenoma.. Repeat in 5 years 2016  Due  In 10/2014 Up to date with PNA and tdap. DVE/pap: nml pap 201She would like to stop DVEs now.1, no pap indicated. No family hx of uterine or ovarian ca. Asymptomatic.   Smoker: only 1/2 pack a day. Has been smoking 40 pack years. Nml CXR in 2008, will repeat today. Not interested in  spirometry today.  Will consider lung cancer screen.

## 2014-07-31 NOTE — Patient Instructions (Addendum)
Increase exercise as able.  QUIT smoking. Can make appt for spirometry for COPD eval.  Call if interested in low dose lung cancer screening trial of dymista for nasal  Congestion.Marland Kitchen

## 2014-08-01 ENCOUNTER — Telehealth: Payer: Self-pay | Admitting: Family Medicine

## 2014-08-19 ENCOUNTER — Other Ambulatory Visit: Payer: Self-pay | Admitting: Family Medicine

## 2014-08-21 ENCOUNTER — Other Ambulatory Visit: Payer: Self-pay | Admitting: Family Medicine

## 2014-08-27 ENCOUNTER — Other Ambulatory Visit: Payer: Self-pay | Admitting: Family Medicine

## 2014-08-30 ENCOUNTER — Other Ambulatory Visit: Payer: Self-pay | Admitting: Family Medicine

## 2014-08-31 ENCOUNTER — Other Ambulatory Visit: Payer: Self-pay | Admitting: Family Medicine

## 2014-09-13 ENCOUNTER — Ambulatory Visit: Payer: Medicare Other | Admitting: Internal Medicine

## 2014-09-13 ENCOUNTER — Ambulatory Visit (INDEPENDENT_AMBULATORY_CARE_PROVIDER_SITE_OTHER): Payer: Medicare Other | Admitting: Primary Care

## 2014-09-13 ENCOUNTER — Encounter: Payer: Self-pay | Admitting: Primary Care

## 2014-09-13 VITALS — BP 112/62 | HR 71 | Temp 98.2°F | Ht 60.0 in | Wt 127.4 lb

## 2014-09-13 DIAGNOSIS — L02413 Cutaneous abscess of right upper limb: Secondary | ICD-10-CM | POA: Diagnosis not present

## 2014-09-13 DIAGNOSIS — J309 Allergic rhinitis, unspecified: Secondary | ICD-10-CM

## 2014-09-13 MED ORDER — FLUTICASONE PROPIONATE 50 MCG/ACT NA SUSP: NASAL | Status: DC

## 2014-09-13 MED ORDER — CLINDAMYCIN HCL 300 MG PO CAPS: 300.0000 mg | ORAL_CAPSULE | Freq: Four times a day (QID) | ORAL | Status: DC

## 2014-09-13 NOTE — Progress Notes (Signed)
Pre visit review using our clinic review tool, if applicable. No additional management support is needed unless otherwise documented below in the visit note. 

## 2014-09-13 NOTE — Patient Instructions (Signed)
Start Clindamycin antibiotics. Take 1 tablet by mouth four times daily for abscess. Finish this medication completely as directed. Call me if no improvement in the next 3-4 days. It was nice meeting you!

## 2014-09-13 NOTE — Progress Notes (Signed)
Subjective:    Patient ID: Katelyn Kennedy, female    DOB: 1945/08/08, 69 y.o.   MRN: [id number]  HPI  Katelyn Kennedy is a 69 year old female who presents today with a chief complaint of abscess to her right posterior shoulder. 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 She will get treated with antibiotics and abscess will reoccur within 3-6 months. Her last abscess was about 4 months ago. This abscess has been present for the past 10 days and opened yesterday afternoon on its own. She denies fevers, chills, irritation.  Review of Systems  Constitutional: Negative for fever and chills.  Respiratory: Negative for shortness of breath.   Cardiovascular: Negative for chest pain.  Skin: Positive for wound.       Past Medical History  Diagnosis Date  . HTN (hypertension)   . History of chicken pox   . Arthritis   . Allergic rhinitis     History   Social History  . Marital Status: Divorced    Spouse Name: N/A  . Number of Children: N/A  . Years of Education: N/A   Occupational History  . Not on file.   Social History Main Topics  . Smoking status: Current Every Day Smoker -- 0.50 packs/day for 40 years    Types: Cigarettes  . Smokeless tobacco: Never Used  . Alcohol Use: Yes     Comment: occassionally  . Drug Use: No  . Sexual Activity: Not on file   Other Topics Concern  . Not on file   Social History Narrative    Past Surgical History  Procedure Laterality Date  . Bartholyn gland removal  1981  . Cataract extraction    . Breast biopsy  1978    BENIGN CYST    Family History  Problem Relation Age of Onset  . Diabetes Mother   . Cancer Sister 53    BREAST  . Cancer Maternal Grandmother     COLON    Allergies  Allergen Reactions  . Codeine     REACTION: nausea    Current Outpatient Prescriptions on File Prior to Visit  Medication Sig Dispense Refill  . alendronate (FOSAMAX) 70 MG tablet TAKE 1 TABLET  BY MOUTH EVERY WEEK ON THE SAME DAY. TAKE WITH A FULL GLASS OF WATER ON AN EMPTY STOMA 12 tablet 3  . aspirin 81 MG tablet Take 81 mg by mouth daily.      Marland Kitchen atorvastatin (LIPITOR) 20 MG tablet TAKE 1 TABLET BY MOUTH DAILY 90 tablet 3  . Calcium Carbonate-Vitamin D (CALCIUM + D) 600-200 MG-UNIT per tablet Take 1 tablet by mouth 2 (two) times daily.      Marland Kitchen lisinopril (PRINIVIL,ZESTRIL) 5 MG tablet TAKE 1/2 TABLET BY MOUTH DAILY 45 tablet 2  . Multiple Vitamin (MULTIVITAMIN) capsule Take 1 capsule by mouth daily.      Marland Kitchen ZOSTAVAX 38182 UNT/0.65ML injection   0   No current facility-administered medications on file prior to visit.    BP 112/62 mmHg  Pulse 71  Temp(Src) 98.2 F (36.8 C) (Oral)  Ht 5' (1.524 m)  Wt 127 lb 6.4 oz (57.788 kg)  BMI 24.88 kg/m2  SpO2 95%    Objective:   Physical Exam  Cardiovascular: Normal rate and regular rhythm.   Pulmonary/Chest: Effort normal and breath sounds normal.  Skin: There is erythema.  2cm sized open abscess present to right posterior shoulder. Redness present. No  drainage.          Assessment & Plan:

## 2014-09-13 NOTE — Assessment & Plan Note (Signed)
Present x 10 days. Wound opened itself several days ago, no drainage today. Treat with Clindamycin QID. Offered referral for general surgery to rid abscess, patient declines at this time. Follow up as needed.

## 2014-10-25 ENCOUNTER — Encounter: Payer: Self-pay | Admitting: Gastroenterology

## 2014-12-14 ENCOUNTER — Encounter: Payer: Self-pay | Admitting: Family Medicine

## 2014-12-14 ENCOUNTER — Ambulatory Visit (INDEPENDENT_AMBULATORY_CARE_PROVIDER_SITE_OTHER): Payer: Medicare Other | Admitting: Family Medicine

## 2014-12-14 VITALS — BP 110/60 | HR 65 | Temp 98.1°F | Ht 60.0 in | Wt 124.5 lb

## 2014-12-14 DIAGNOSIS — L03115 Cellulitis of right lower limb: Secondary | ICD-10-CM

## 2014-12-14 MED ORDER — DOXYCYCLINE HYCLATE 100 MG PO CAPS: 100.0000 mg | ORAL_CAPSULE | Freq: Two times a day (BID) | ORAL | Status: DC

## 2014-12-14 NOTE — Assessment & Plan Note (Signed)
Possible spider bite, but no bite mark.. Treat with antihistamine.  More likely cellulitis in pt with DM.Marland Kitchen Treat with doxy given boil in past and new exposure to elderly female at doctors a lot, 10 day course.  Follow up if not improving in 72 hours.

## 2014-12-14 NOTE — Patient Instructions (Addendum)
Can use zyrtec at bedtime for allergic response if possible spinder bite.  Start and complete antibiotics... 10 days of doxycycline.  Call if redness spreads beyond the black marker line.  Should note improvement in 48-72hours, follow up on MOnday if no improvement.

## 2014-12-14 NOTE — Progress Notes (Signed)
Pre visit review using our clinic review tool, if applicable. No additional management support is needed unless otherwise documented below in the visit note. 

## 2014-12-14 NOTE — Progress Notes (Signed)
   Subjective:    Patient ID: Katelyn Kennedy, female    DOB: 13-Dec-1945, 69 y.o.   MRN: [id number]  HPI  69 year old female with well controlled DM presents with new onset insect bite   on right ankle , occurred in last 3 days. She did not witness a bite.  Awoke the next morning with sore area.. No with spreading redness, swelling, pain with pressure. No fever, no N, V.  Feel well otherwise, no flu like symptoms.  Living with elderly sick lady to help her out.  She is a Ship broker, cleaning a lot.no MRSA contact.   Has been using OTC analgesic for pain. Benadryl cream has not helped at all.    Lab Results  Component Value Date   HGBA1C 5.7 07/27/2014    Hx of abscess on her right arm 4/17.. Neg culture. No MRSA.  Review of Systems  Constitutional: Negative for fever and fatigue.  HENT: Negative for ear pain.   Eyes: Negative for pain.  Respiratory: Negative for chest tightness and shortness of breath.   Cardiovascular: Negative for chest pain, palpitations and leg swelling.  Gastrointestinal: Negative for abdominal pain.  Genitourinary: Negative for dysuria.       Objective:   Physical Exam  Constitutional: Vital signs are normal. She appears well-developed and well-nourished. She is cooperative.  Non-toxic appearance. She does not appear ill. No distress.  HENT:  Head: Normocephalic.  Right Ear: Hearing, tympanic membrane, external ear and ear canal normal. Tympanic membrane is not erythematous, not retracted and not bulging.  Left Ear: Hearing, tympanic membrane, external ear and ear canal normal. Tympanic membrane is not erythematous, not retracted and not bulging.  Nose: No mucosal edema or rhinorrhea. Right sinus exhibits no maxillary sinus tenderness and no frontal sinus tenderness. Left sinus exhibits no maxillary sinus tenderness and no frontal sinus tenderness.  Mouth/Throat: Uvula is midline, oropharynx is clear and moist and mucous membranes are normal.  Eyes:  Conjunctivae, EOM and lids are normal. Pupils are equal, round, and reactive to light. Lids are everted and swept, no foreign bodies found.  Neck: Trachea normal and normal range of motion. Neck supple. Carotid bruit is not present. No thyroid mass and no thyromegaly present.  Cardiovascular: Normal rate, regular rhythm, S1 normal, S2 normal, normal heart sounds, intact distal pulses and normal pulses.  Exam reveals no gallop and no friction rub.   No murmur heard. Pulmonary/Chest: Effort normal and breath sounds normal. No tachypnea. No respiratory distress. She has no decreased breath sounds. She has no wheezes. She has no rhonchi. She has no rales.  Abdominal: Soft. Normal appearance and bowel sounds are normal. There is no tenderness.  Neurological: She is alert.  Skin: Skin is warm, dry and intact. Rash noted.     Redness, warmth, swelling in medial right lower leg , marked with marker, oblong ~12 x 8 cm  Psychiatric: Her speech is normal and behavior is normal. Judgment and thought content normal. Her mood appears not anxious. Cognition and memory are normal. She does not exhibit a depressed mood.          Assessment & Plan:

## 2015-05-14 ENCOUNTER — Other Ambulatory Visit: Payer: Self-pay | Admitting: Family Medicine

## 2015-07-29 ENCOUNTER — Other Ambulatory Visit: Payer: Self-pay | Admitting: Family Medicine

## 2015-07-29 ENCOUNTER — Other Ambulatory Visit (INDEPENDENT_AMBULATORY_CARE_PROVIDER_SITE_OTHER): Payer: Medicare Other

## 2015-07-29 ENCOUNTER — Telehealth: Payer: Self-pay | Admitting: Family Medicine

## 2015-07-29 DIAGNOSIS — Z7289 Other problems related to lifestyle: Secondary | ICD-10-CM | POA: Diagnosis not present

## 2015-07-29 DIAGNOSIS — E7800 Pure hypercholesterolemia, unspecified: Secondary | ICD-10-CM | POA: Diagnosis not present

## 2015-07-29 DIAGNOSIS — Z1159 Encounter for screening for other viral diseases: Secondary | ICD-10-CM

## 2015-07-29 DIAGNOSIS — E119 Type 2 diabetes mellitus without complications: Secondary | ICD-10-CM

## 2015-07-29 DIAGNOSIS — M810 Age-related osteoporosis without current pathological fracture: Secondary | ICD-10-CM | POA: Diagnosis not present

## 2015-07-29 LAB — COMPREHENSIVE METABOLIC PANEL
ALT: 17 U/L (ref 0–35)
AST: 20 U/L (ref 0–37)
Albumin: 4.2 g/dL (ref 3.5–5.2)
Alkaline Phosphatase: 80 U/L (ref 39–117)
BUN: 12 mg/dL (ref 6–23)
CO2: 24 mEq/L (ref 19–32)
Calcium: 9.9 mg/dL (ref 8.4–10.5)
Chloride: 108 mEq/L (ref 96–112)
Creatinine, Ser: 0.69 mg/dL (ref 0.40–1.20)
GFR: 89.04 mL/min (ref >60.00)
Glucose, Bld: 92 mg/dL (ref 70–99)
Potassium: 4 mEq/L (ref 3.5–5.1)
Sodium: 140 mEq/L (ref 135–145)
Total Bilirubin: 0.5 mg/dL (ref 0.2–1.2)
Total Protein: 7 g/dL (ref 6.0–8.3)

## 2015-07-29 LAB — LIPID PANEL
Cholesterol: 176 mg/dL (ref 0–200)
HDL: 58.3 mg/dL (ref >39.00)
LDL Cholesterol: 89 mg/dL (ref 0–99)
NonHDL: 117.6
Total CHOL/HDL Ratio: 3
Triglycerides: 143 mg/dL (ref 0.0–149.0)
VLDL: 28.6 mg/dL (ref 0.0–40.0)

## 2015-07-29 LAB — VITAMIN D 25 HYDROXY (VIT D DEFICIENCY, FRACTURES): VITD: 50.5 ng/mL (ref 30.00–100.00)

## 2015-07-29 LAB — HEMOGLOBIN A1C: Hgb A1c MFr Bld: 5.8 % (ref 4.6–6.5)

## 2015-07-29 NOTE — Telephone Encounter (Signed)
-----   Message from Marchia Bond sent at 07/19/2015 10:44 AM EDT ----- Regarding: Cpx labs Mon 4/17, need orders.Thanks! :-) Please order  future cpx labs for pt's upcoming lab appt. Thanks Aniceto Boss

## 2015-07-30 LAB — HEPATITIS C ANTIBODY: HCV Ab: NEGATIVE

## 2015-08-02 ENCOUNTER — Ambulatory Visit (INDEPENDENT_AMBULATORY_CARE_PROVIDER_SITE_OTHER): Payer: Medicare Other | Admitting: Family Medicine

## 2015-08-02 ENCOUNTER — Encounter: Payer: Self-pay | Admitting: Family Medicine

## 2015-08-02 VITALS — BP 120/70 | HR 76 | Temp 98.5°F | Ht 59.75 in | Wt 126.4 lb

## 2015-08-02 DIAGNOSIS — E7800 Pure hypercholesterolemia, unspecified: Secondary | ICD-10-CM

## 2015-08-02 DIAGNOSIS — Z7189 Other specified counseling: Secondary | ICD-10-CM

## 2015-08-02 DIAGNOSIS — E119 Type 2 diabetes mellitus without complications: Secondary | ICD-10-CM | POA: Diagnosis not present

## 2015-08-02 DIAGNOSIS — Z0000 Encounter for general adult medical examination without abnormal findings: Secondary | ICD-10-CM

## 2015-08-02 DIAGNOSIS — B070 Plantar wart: Secondary | ICD-10-CM

## 2015-08-02 LAB — HM DIABETES FOOT EXAM

## 2015-08-02 NOTE — Assessment & Plan Note (Signed)
Well controlled. Continue current medication.  

## 2015-08-02 NOTE — Patient Instructions (Addendum)
Try compound W and duct tape for plantars wart.. Call if not improving.  Schedule mammogram on your own. Make sure to schedule colonoscopy.  Quit smoking!  Call  If you are interested regarding lung cancer screening program referral.

## 2015-08-02 NOTE — Progress Notes (Signed)
Pre visit review using our clinic review tool, if applicable. No additional management support is needed unless otherwise documented below in the visit note. 

## 2015-08-02 NOTE — Assessment & Plan Note (Signed)
.  Well controlleld with diet.

## 2015-08-02 NOTE — Assessment & Plan Note (Signed)
No living will and HCPOA (reviewed 2017), given EOL info today

## 2015-08-02 NOTE — Assessment & Plan Note (Signed)
Salicylic acid and duct tape.

## 2015-08-02 NOTE — Progress Notes (Signed)
have personally reviewed the Medicare Annual Wellness questionnaire and have noted  1. The patient's medical and social history  2. Their use of alcohol, tobacco or illicit drugs  3. Their current medications and supplements  4. The patient's functional ability including ADL's, fall risks, home safety risks and hearing or visual  impairment.  5. Diet and physical activities  6. Evidence for depression or mood disorders  The patients weight, height, BMI and visual acuity have been recorded in the chart  I have made referrals, counseling and provided education to the patient based review of the above and I have provided the pt with a written personalized care plan for preventive services.     BP Readings from Last 3 Encounters:  08/02/15 120/70  12/14/14 110/60  09/13/14 112/62    Diabetes: Well controlled on diet.  Lab Results  Component Value Date   HGBA1C 5.8 07/29/2015  Using medications without difficulties:  Hypoglycemic episodes:?  Hyperglycemic episodes:?  Feet problems: Has plantars wart on left sole of mid foot. Blood Sugars averaging: not checking  eye exam within last year: Yes   Wt Readings from Last 3 Encounters:  08/02/15 126 lb 6.4 oz (57.335 kg)  12/14/14 124 lb 8 oz (56.473 kg)  09/13/14 127 lb 6.4 oz (57.788 kg)   Elevated Cholesterol: Well controlled (at goal <100) on lipitor 20 mg daily  Lab Results  Component Value Date   CHOL 176 07/29/2015   HDL 58.30 07/29/2015   LDLCALC 89 07/29/2015   LDLDIRECT 118.7 01/24/2013   TRIG 143.0 07/29/2015   CHOLHDL 3 07/29/2015   Using medications without problems: Yes  Muscle aches: NONE  Diet compliance: Issues with portion control. Exercise: Walking every few days Other complaints:   Review of Systems  Constitutional: Negative for fever, fatigue and unexpected weight change.  HENT: No trouble swallowing and sinus pressure.  Eyes: Negative for pain and itching.  Respiratory:see above   Cardiovascular: Negative for chest pain, palpitations and leg swelling.  Gastrointestinal: Negative for nausea, abdominal pain, diarrhea, constipation and blood in stool.  Genitourinary: Negative for dysuria, hematuria, vaginal bleeding, vaginal discharge, difficulty urinating and menstrual problem.  Skin: Negative for rash.  Neurological: Negative for syncope, weakness, light-headedness, numbness and headaches.  Psychiatric/Behavioral: Negative for confusion and dysphoric mood. The patient is not nervous/anxious.  Objective:   Physical Exam  Constitutional: Vital signs are normal. She appears well-developed and well-nourished. She is cooperative. Non-toxic appearance. She does not appear ill. No distress.  HENT:  Head: Normocephalic.  Right Ear: Hearing, tympanic membrane, external ear and ear canal normal.  Left Ear: Hearing, tympanic membrane, external ear and ear canal normal.  Nose: Nose normal.  Eyes: Conjunctivae, EOM and lids are normal. Pupils are equal, round, and reactive to light. No foreign bodies found.  Neck: Trachea normal and normal range of motion. Neck supple. Carotid bruit is not present. No mass and no thyromegaly present.  Cardiovascular: Normal rate, regular rhythm, S1 normal, S2 normal, normal heart sounds and intact distal pulses. Exam reveals no gallop.  No murmur heard.  Pulmonary/Chest: Effort normal and breath sounds normal. No respiratory distress. She has no wheezes. She has no rhonchi. She has no rales.  Abdominal: Soft. Normal appearance and bowel sounds are normal. She exhibits no distension, no fluid wave, no abdominal bruit and no mass. There is no hepatosplenomegaly. There is no tenderness. There is no rebound, no guarding and no CVA tenderness. No hernia.  Genitourinary: No breast swelling, tenderness, discharge  or bleeding. Pelvic exam was NO DVE  Lymphadenopathy:  She has no cervical adenopathy.  She has no axillary adenopathy.   Neurological: She is alert. She has normal strength. No cranial nerve deficit or sensory deficit.  Skin: Skin is warm, dry and intact. No rash noted.  Psychiatric: Her speech is normal and behavior is normal. Judgment normal. Her mood appears not anxious. Cognition and memory are normal. She does not exhibit a depressed mood.   Diabetic foot exam:  Normal inspection  Except plantar wart left mid foot No skin breakdown  No calluses  Normal DP pulses  Normal sensation to light touch and monofilament  Nails normal  Assessment & Plan:   AMW:The patient's preventative maintenance and recommended screening tests for an annual wellness exam were reviewed in full today.  Brought up to date unless services declined.  Counselled on the importance of diet, exercise, and its role in overall health and mortality.  The patient's FH and SH was reviewed, including their home life, tobacco status, and drug and alcohol status.   Mammogram 05/2014 nml, due q 1-2 yrs Last DEXA improved to osteopenia on fosamax 05/2014, repeat in 2 years Colonoscopy: 10/2009... Adenoma.. Repeat in 5 years 2016  OVerdue! Up to date with vaccines. DVE/pap: nml pap in past She would like to stop DVEs now.1, no pap indicated. No family hx of uterine or ovarian ca. Asymptomatic.  Smoker: only 1/2 pack a day. Cutting back, not interested in medication. Has been smoking 40 pack years. Nml CXR in 2008, will repeat today. Not interested in spirometry today. Will consider lung cancer screen. Hep C: neg

## 2015-08-03 ENCOUNTER — Other Ambulatory Visit: Payer: Self-pay | Admitting: Family Medicine

## 2015-08-06 ENCOUNTER — Other Ambulatory Visit: Payer: Self-pay | Admitting: Family Medicine

## 2015-09-01 ENCOUNTER — Other Ambulatory Visit: Payer: Self-pay | Admitting: Family Medicine

## 2016-02-03 ENCOUNTER — Other Ambulatory Visit: Payer: Self-pay | Admitting: Family Medicine

## 2016-05-19 ENCOUNTER — Other Ambulatory Visit: Payer: Self-pay | Admitting: Family Medicine

## 2016-05-19 DIAGNOSIS — Z1231 Encounter for screening mammogram for malignant neoplasm of breast: Secondary | ICD-10-CM

## 2016-05-26 ENCOUNTER — Ambulatory Visit
Admission: RE | Admit: 2016-05-26 | Discharge: 2016-05-26 | Disposition: A | Discharge status: Home / Self Care | Payer: Medicare Other | Source: Ambulatory Visit | Attending: Family Medicine | Admitting: Family Medicine

## 2016-05-26 DIAGNOSIS — Z1231 Encounter for screening mammogram for malignant neoplasm of breast: Secondary | ICD-10-CM | POA: Diagnosis not present

## 2016-07-13 ENCOUNTER — Other Ambulatory Visit: Payer: Self-pay | Admitting: Family Medicine

## 2016-07-31 ENCOUNTER — Other Ambulatory Visit (INDEPENDENT_AMBULATORY_CARE_PROVIDER_SITE_OTHER): Payer: Medicare Other

## 2016-07-31 ENCOUNTER — Telehealth: Payer: Self-pay | Admitting: Family Medicine

## 2016-07-31 DIAGNOSIS — E119 Type 2 diabetes mellitus without complications: Secondary | ICD-10-CM | POA: Diagnosis not present

## 2016-07-31 DIAGNOSIS — E7800 Pure hypercholesterolemia, unspecified: Secondary | ICD-10-CM

## 2016-07-31 DIAGNOSIS — M810 Age-related osteoporosis without current pathological fracture: Secondary | ICD-10-CM

## 2016-07-31 LAB — COMPREHENSIVE METABOLIC PANEL
ALT: 15 U/L (ref 0–35)
AST: 18 U/L (ref 0–37)
Albumin: 4.4 g/dL (ref 3.5–5.2)
Alkaline Phosphatase: 64 U/L (ref 39–117)
BUN: 16 mg/dL (ref 6–23)
CO2: 27 mEq/L (ref 19–32)
Calcium: 9.9 mg/dL (ref 8.4–10.5)
Chloride: 105 mEq/L (ref 96–112)
Creatinine, Ser: 0.76 mg/dL (ref 0.40–1.20)
GFR: 79.42 mL/min (ref >60.00)
Glucose, Bld: 102 mg/dL — ABNORMAL HIGH (ref 70–99)
Potassium: 4.3 mEq/L (ref 3.5–5.1)
Sodium: 139 mEq/L (ref 135–145)
Total Bilirubin: 0.5 mg/dL (ref 0.2–1.2)
Total Protein: 7.2 g/dL (ref 6.0–8.3)

## 2016-07-31 LAB — LIPID PANEL
Cholesterol: 189 mg/dL (ref 0–200)
HDL: 61.2 mg/dL (ref >39.00)
LDL Cholesterol: 98 mg/dL (ref 0–99)
NonHDL: 128
Total CHOL/HDL Ratio: 3
Triglycerides: 151 mg/dL — ABNORMAL HIGH (ref 0.0–149.0)
VLDL: 30.2 mg/dL (ref 0.0–40.0)

## 2016-07-31 LAB — HEMOGLOBIN A1C: Hgb A1c MFr Bld: 5.8 % (ref 4.6–6.5)

## 2016-07-31 LAB — VITAMIN D 25 HYDROXY (VIT D DEFICIENCY, FRACTURES): VITD: 46.39 ng/mL (ref 30.00–100.00)

## 2016-07-31 NOTE — Telephone Encounter (Signed)
-----   Message from Marchia Bond sent at 07/22/2016  2:36 PM EDT ----- Regarding: Cpx labs Fri 4/20, need orders. Thanks! Please order  future cpx labs for pt's upcoming lab appt. Thanks Aniceto Boss

## 2016-08-03 ENCOUNTER — Other Ambulatory Visit: Payer: Self-pay | Admitting: Family Medicine

## 2016-08-07 ENCOUNTER — Encounter: Payer: Self-pay | Admitting: Family Medicine

## 2016-08-07 ENCOUNTER — Ambulatory Visit (INDEPENDENT_AMBULATORY_CARE_PROVIDER_SITE_OTHER): Payer: Medicare Other | Admitting: Family Medicine

## 2016-08-07 VITALS — BP 100/60 | HR 74 | Temp 98.9°F | Ht 59.5 in | Wt 126.8 lb

## 2016-08-07 DIAGNOSIS — F17200 Nicotine dependence, unspecified, uncomplicated: Secondary | ICD-10-CM

## 2016-08-07 DIAGNOSIS — E7800 Pure hypercholesterolemia, unspecified: Secondary | ICD-10-CM

## 2016-08-07 DIAGNOSIS — E119 Type 2 diabetes mellitus without complications: Secondary | ICD-10-CM | POA: Diagnosis not present

## 2016-08-07 DIAGNOSIS — J301 Allergic rhinitis due to pollen: Secondary | ICD-10-CM

## 2016-08-07 DIAGNOSIS — M810 Age-related osteoporosis without current pathological fracture: Secondary | ICD-10-CM

## 2016-08-07 DIAGNOSIS — Z0000 Encounter for general adult medical examination without abnormal findings: Secondary | ICD-10-CM | POA: Diagnosis not present

## 2016-08-07 NOTE — Progress Notes (Signed)
Pre visit review using our clinic review tool, if applicable. No additional management support is needed unless otherwise documented below in the visit note. 

## 2016-08-07 NOTE — Assessment & Plan Note (Signed)
Good control on  No med. Encouraged exercise, weight loss, healthy eating habits.

## 2016-08-07 NOTE — Assessment & Plan Note (Signed)
Smoking cessation instruction/counseling given:  counseled patient on the dangers of tobacco use, advised patient to stop smoking, and reviewed strategies to maximize success 

## 2016-08-07 NOTE — Assessment & Plan Note (Signed)
Stop fosamax as on 4-5 years.. Check DEXA.

## 2016-08-07 NOTE — Progress Notes (Signed)
Subjective:    Patient ID: Katelyn Kennedy, female    DOB: 1946-01-09, 71 y.o.   MRN: [id number]  HPI   The patient presents for annual medicare wellness, complete physical and review of chronic health problems.  I have personally reviewed the Medicare Annual Wellness questionnaire and have noted 1. The patient's medical and social history 2. Their use of alcohol, tobacco or illicit drugs 3. Their current medications and supplements 4. The patient's functional ability including ADL's, fall risks, home safety risks and hearing or visual             impairment. 5. Diet and physical activities 6. Evidence for depression or mood disorders 7.         Updated provider list Cognitive evaluation was performed and recorded on pt medicare questionnaire form. The patients weight, height, BMI and visual acuity have been recorded in the chart  I have made referrals, counseling and provided education to the patient based review of the above and I have provided the pt with a written personalized care plan for preventive services.   Documentation of this information was scanned into the electronic record under the media tab.   Advance directives and end of life planning reviewed in detail with patient and documented in EMR. Patient given handout on advance care directives LAST YEAR. HCPOA and living will updated if needed.   Diabetes:   Well controlled on diet Lab Results  Component Value Date   HGBA1C 5.8 07/31/2016  Using medications without difficulties: Hypoglycemic episodes: Hyperglycemic episodes: Feet problems: no ulcer Blood Sugars averaging:not checking eye exam within last year: due  Elevated Cholesterol:  Well controlled on lipitor 20 mg daily Lab Results  Component Value Date   CHOL 189 07/31/2016   HDL 61.20 07/31/2016   LDLCALC 98 07/31/2016   LDLDIRECT 118.7 01/24/2013   TRIG 151.0 (H) 07/31/2016   CHOLHDL 3 07/31/2016  Using medications without problems: none Muscle  aches: none Diet compliance: good Exercise: walking Other complaints:    Hearing Screening   Method: Audiometry   '125Hz'$  '250Hz'$  '500Hz'$  '1000Hz'$  '2000Hz'$  '3000Hz'$  '4000Hz'$  '6000Hz'$  '8000Hz'$   Right ear:   '20 20 20  20    '$ Left ear:   '20 20 20  20      '$ Visual Acuity Screening   Right eye Left eye Both eyes  Without correction:     With correction: '20/20 20/25 20/20 '$   Social History /Family History/Past Medical History reviewed in detail and updated in EMR if needed.  Blood pressure 100/60, pulse 74, temperature 98.9 F (37.2 C), temperature source Oral, height 4' 11.5" (1.511 m), weight 126 lb 12 oz (57.5 kg).   Review of Systems  Constitutional: Negative for fatigue and fever.  HENT: Negative for congestion.        Thick nasal congestion, occ wakes her at night flonase does not help much.  Eyes: Negative for pain.  Respiratory: Negative for cough and shortness of breath.   Cardiovascular: Negative for chest pain, palpitations and leg swelling.  Gastrointestinal: Negative for abdominal pain.  Genitourinary: Negative for dysuria and vaginal bleeding.  Musculoskeletal: Negative for back pain.  Neurological: Negative for syncope, light-headedness and headaches.  Psychiatric/Behavioral: Negative for dysphoric mood.       Objective:   Physical Exam  Constitutional: Vital signs are normal. She appears well-developed and well-nourished. She is cooperative.  Non-toxic appearance. She does not appear ill. No distress.  HENT:  Head: Normocephalic.  Right Ear: Hearing, tympanic membrane, external  ear and ear canal normal.  Left Ear: Hearing, tympanic membrane, external ear and ear canal normal.  Nose: Nose normal.  Eyes: Conjunctivae, EOM and lids are normal. Pupils are equal, round, and reactive to light. Lids are everted and swept, no foreign bodies found.  Neck: Trachea normal and normal range of motion. Neck supple. Carotid bruit is not present. No thyroid mass and no thyromegaly present.    Cardiovascular: Normal rate, regular rhythm, S1 normal, S2 normal, normal heart sounds and intact distal pulses.  Exam reveals no gallop.   No murmur heard. Pulmonary/Chest: Effort normal and breath sounds normal. No respiratory distress. She has no wheezes. She has no rhonchi. She has no rales.  Abdominal: Soft. Normal appearance and bowel sounds are normal. She exhibits no distension, no fluid wave, no abdominal bruit and no mass. There is no hepatosplenomegaly. There is no tenderness. There is no rebound, no guarding and no CVA tenderness. No hernia.  Lymphadenopathy:    She has no cervical adenopathy.    She has no axillary adenopathy.  Neurological: She is alert. She has normal strength. No cranial nerve deficit or sensory deficit.  Skin: Skin is warm, dry and intact. No rash noted.  Psychiatric: Her speech is normal and behavior is normal. Judgment normal. Her mood appears not anxious. Cognition and memory are normal. She does not exhibit a depressed mood.      Diabetic foot exam: Normal inspection No skin breakdown No calluses  Normal DP pulses Normal sensation to light touch and monofilament Nails normal     Assessment & Plan:  The patient's preventative maintenance and recommended screening tests for an annual wellness exam were reviewed in full today. Brought up to date unless services declined.  Counselled on the importance of diet, exercise, and its role in overall health and mortality. The patient's FH and SH was reviewed, including their home life, tobacco status, and drug and alcohol status.   Mammogram 05/2016 nml, due q 1-2 yrs Last DEXA improved to osteopenia on fosamax (has been on for 4-5 years) 05/2014,  STOP fosamax 2018 and get DExA this year,repeat in 2 years off the med. Colonoscopy: 10/2009... Adenoma.. Repeat in 5 years 2016  OVerdue! Up to date with vaccines. DVE/pap: nml pap in past She would like to stop DVEs now, no pap indicated. No family hx of  uterine or ovarian ca. Asymptomatic.  Smoker: only 1/2 pack a day. Cutting back, not interested in medication. Has been smoking 40 pack years. Nml CXR in 2017. Not interested in spirometry today. Will consider lung cancer screen. Hep C: neg

## 2016-08-07 NOTE — Assessment & Plan Note (Signed)
Treat congestion with quitting smoking and mucinex.

## 2016-08-07 NOTE — Patient Instructions (Addendum)
Can try mucinex plain at bedtime for thick nasal mucus as well as flonase and nasal saline irrigation.  QUIT smoking!!  call GI to set up colonoscopy. STOP fosamax and get DEXA this year, plan repeat in 2 years off the med. Please stop at the front desk to set up referral.  Call if interested in lung cancer screening. Schedule yearly eye exam.

## 2016-08-13 ENCOUNTER — Other Ambulatory Visit: Payer: Self-pay | Admitting: Primary Care

## 2016-08-13 DIAGNOSIS — J309 Allergic rhinitis, unspecified: Secondary | ICD-10-CM

## 2016-08-31 ENCOUNTER — Other Ambulatory Visit: Payer: Self-pay | Admitting: Family Medicine

## 2016-10-05 ENCOUNTER — Other Ambulatory Visit: Payer: Self-pay | Admitting: Family Medicine

## 2016-10-05 NOTE — Telephone Encounter (Signed)
Received refill request electronically Last office visit 08/07/16 Medication is no longer on list, please confirm that medication was stopped

## 2016-10-13 ENCOUNTER — Other Ambulatory Visit: Payer: Self-pay | Admitting: *Deleted

## 2016-10-20 ENCOUNTER — Other Ambulatory Visit: Payer: Self-pay | Admitting: Family Medicine

## 2016-11-03 ENCOUNTER — Other Ambulatory Visit: Payer: Self-pay | Admitting: Family Medicine

## 2016-11-25 ENCOUNTER — Telehealth: Payer: Self-pay | Admitting: Family Medicine

## 2016-11-25 DIAGNOSIS — Z1211 Encounter for screening for malignant neoplasm of colon: Secondary | ICD-10-CM

## 2016-11-25 NOTE — Telephone Encounter (Signed)
Caller Name:Banessa Farve  Relationship to Patient:self Best number:[phone] Pharmacy:  Reason for call:  Calling to get referral for gi for colonoscopy. Prefers [facility] [place].

## 2016-11-26 ENCOUNTER — Encounter: Payer: Self-pay | Admitting: Gastroenterology

## 2016-12-24 DIAGNOSIS — H18413 Arcus senilis, bilateral: Secondary | ICD-10-CM | POA: Diagnosis not present

## 2016-12-24 DIAGNOSIS — H35373 Puckering of macula, bilateral: Secondary | ICD-10-CM | POA: Diagnosis not present

## 2016-12-24 DIAGNOSIS — H11423 Conjunctival edema, bilateral: Secondary | ICD-10-CM | POA: Diagnosis not present

## 2016-12-24 DIAGNOSIS — E119 Type 2 diabetes mellitus without complications: Secondary | ICD-10-CM | POA: Diagnosis not present

## 2016-12-24 DIAGNOSIS — H04123 Dry eye syndrome of bilateral lacrimal glands: Secondary | ICD-10-CM | POA: Diagnosis not present

## 2016-12-24 LAB — HM DIABETES EYE EXAM

## 2017-01-04 ENCOUNTER — Ambulatory Visit (AMBULATORY_SURGERY_CENTER): Payer: Self-pay | Admitting: *Deleted

## 2017-01-04 VITALS — Ht 60.0 in | Wt 129.0 lb

## 2017-01-04 DIAGNOSIS — Z86010 Personal history of colonic polyps: Secondary | ICD-10-CM

## 2017-01-04 MED ORDER — NA SULFATE-K SULFATE-MG SULF 17.5-3.13-1.6 GM/177ML PO SOLN: ORAL | 0 refills | Status: DC

## 2017-01-04 NOTE — Progress Notes (Signed)
Patient denies any allergies to eggs or soy. Patient denies any problems with anesthesia/sedation. Patient denies any oxygen use at home and does not take any diet/weight loss medications. EMMI education assisgned to patient on colonoscopy, this was explained and instructions given to patient. 

## 2017-01-06 ENCOUNTER — Encounter: Payer: Self-pay | Admitting: Gastroenterology

## 2017-01-14 ENCOUNTER — Telehealth: Payer: Self-pay | Admitting: *Deleted

## 2017-01-14 NOTE — Telephone Encounter (Signed)
Patient called in reference to needing to schedule labs. Patient stated she is already scheduled  02/02/17 for labs. Please call patient and advise if needed to come in sooner.

## 2017-01-14 NOTE — Telephone Encounter (Signed)
Lm on pts vm requesting a call back to schedule lab only appt for microalbumin for nephropathy gap report

## 2017-01-20 ENCOUNTER — Ambulatory Visit (AMBULATORY_SURGERY_CENTER): Payer: PPO | Admitting: Gastroenterology

## 2017-01-20 ENCOUNTER — Encounter: Payer: Self-pay | Admitting: Gastroenterology

## 2017-01-20 VITALS — BP 129/78 | HR 79 | Temp 98.9°F | Resp 13 | Ht 60.0 in | Wt 129.0 lb

## 2017-01-20 DIAGNOSIS — D120 Benign neoplasm of cecum: Secondary | ICD-10-CM | POA: Diagnosis not present

## 2017-01-20 DIAGNOSIS — E119 Type 2 diabetes mellitus without complications: Secondary | ICD-10-CM | POA: Diagnosis not present

## 2017-01-20 DIAGNOSIS — I10 Essential (primary) hypertension: Secondary | ICD-10-CM | POA: Diagnosis not present

## 2017-01-20 DIAGNOSIS — Z86010 Personal history of colonic polyps: Secondary | ICD-10-CM | POA: Diagnosis not present

## 2017-01-20 DIAGNOSIS — K5731 Diverticulosis of large intestine without perforation or abscess with bleeding: Secondary | ICD-10-CM | POA: Diagnosis not present

## 2017-01-20 MED ORDER — SODIUM CHLORIDE 0.9 % IV SOLN: 500.0000 mL | INTRAVENOUS | Status: DC

## 2017-01-20 NOTE — Progress Notes (Signed)
Report given to PACU, vss 

## 2017-01-20 NOTE — Progress Notes (Signed)
No problems noted in the recovery room. maw 

## 2017-01-20 NOTE — Progress Notes (Signed)
Called to room to assist during endoscopic procedure.  Patient ID and intended procedure confirmed with present staff. Received instructions for my participation in the procedure from the performing physician.  

## 2017-01-20 NOTE — Op Note (Signed)
[place] Patient Name: Katelyn Kennedy Procedure Date: 01/20/2017 10:00 AM MRN: [id number] Endoscopist: Milus Banister , MD Age: 71 Referring MD:  Date of Birth: 1946/03/11 Gender: Female Account #: [id number] Procedure:                Colonoscopy Indications:              High risk colon cancer surveillance: Personal                            history of colonic polyps; colonoscopy 2011 4 subCM                            polyps, one was a TA Medicines:                Monitored Anesthesia Care Procedure:                Pre-Anesthesia Assessment:                           - Prior to the procedure, a History and Physical                            was performed, and patient medications and                            allergies were reviewed. The patient's tolerance of                            previous anesthesia was also reviewed. The risks                            and benefits of the procedure and the sedation                            options and risks were discussed with the patient.                            All questions were answered, and informed consent                            was obtained. Prior Anticoagulants: The patient has                            taken no previous anticoagulant or antiplatelet                            agents. ASA Grade Assessment: II - A patient with                            mild systemic disease. After reviewing the risks                            and benefits, the patient was deemed in  satisfactory condition to undergo the procedure.                           After obtaining informed consent, the colonoscope                            was passed under direct vision. Throughout the                            procedure, the patient's blood pressure, pulse, and                            oxygen saturations were monitored continuously. The                            Colonoscope was introduced through  the anus and                            advanced to the the cecum, identified by                            appendiceal orifice and ileocecal valve. The                            colonoscopy was performed without difficulty. The                            patient tolerated the procedure well. The quality                            of the bowel preparation was good. The ileocecal                            valve, appendiceal orifice, and rectum were                            photographed. Scope In: 10:02:03 AM Scope Out: 10:13:12 AM Scope Withdrawal Time: 0 hours 6 minutes 45 seconds  Total Procedure Duration: 0 hours 11 minutes 9 seconds  Findings:                 A 4 mm polyp was found in the cecum. The polyp was                            sessile. The polyp was removed with a cold snare.                            Resection and retrieval were complete.                           Multiple small and large-mouthed diverticula were                            found in the entire colon.  The exam was otherwise without abnormality on                            direct and retroflexion views. Complications:            No immediate complications. Estimated blood loss:                            None. Estimated Blood Loss:     Estimated blood loss: none. Impression:               - One 4 mm polyp in the cecum, removed with a cold                            snare. Resected and retrieved.                           - Diverticulosis in the entire examined colon.                           - The examination was otherwise normal on direct                            and retroflexion views. Recommendation:           - Patient has a contact number available for                            emergencies. The signs and symptoms of potential                            delayed complications were discussed with the                            patient. Return to normal activities tomorrow.                             Written discharge instructions were provided to the                            patient.                           - Resume previous diet.                           - Continue present medications.                           You will receive a letter within 2-3 weeks with the                            pathology results and my final recommendations.                           If the polyp(s) is proven to be 'pre-cancerous' on  pathology, you will need repeat colonoscopy in 5                            years. If the polyp(s) is NOT 'precancerous' on                            pathology then you should repeat colon cancer                            screening in 10 years with colonoscopy without need                            for colon cancer screening by any method prior to                            then (including stool testing). Milus Banister, MD 01/20/2017 10:16:04 AM This report has been signed electronically.

## 2017-01-20 NOTE — Patient Instructions (Signed)
YOU HAD AN ENDOSCOPIC PROCEDURE TODAY AT [facility]:   Refer to the procedure report that was given to you for any specific questions about what was found during the examination.  If the procedure report does not answer your questions, please call your gastroenterologist to clarify.  If you requested that your care partner not be given the details of your procedure findings, then the procedure report has been included in a sealed envelope for you to review at your convenience later.  YOU SHOULD EXPECT: Some feelings of bloating in the abdomen. Passage of more gas than usual.  Walking can help get rid of the air that was put into your GI tract during the procedure and reduce the bloating. If you had a lower endoscopy (such as a colonoscopy or flexible sigmoidoscopy) you may notice spotting of blood in your stool or on the toilet paper. If you underwent a bowel prep for your procedure, you may not have a normal bowel movement for a few days.  Please Note:  You might notice some irritation and congestion in your nose or some drainage.  This is from the oxygen used during your procedure.  There is no need for concern and it should clear up in a day or so.  SYMPTOMS TO REPORT IMMEDIATELY:   Following lower endoscopy (colonoscopy or flexible sigmoidoscopy):  Excessive amounts of blood in the stool  Significant tenderness or worsening of abdominal pains  Swelling of the abdomen that is new, acute  Fever of 100F or higher   For urgent or emergent issues, a gastroenterologist can be reached at any hour by calling [phone].   DIET:  We do recommend a small meal at first, but then you may proceed to your regular diet.  Drink plenty of fluids but you should avoid alcoholic beverages for 24 hours.  ACTIVITY:  You should plan to take it easy for the rest of today and you should NOT DRIVE or use heavy machinery until tomorrow (because of the sedation medicines used during the test).     FOLLOW UP: Our staff will call the number listed on your records the next business day following your procedure to check on you and address any questions or concerns that you may have regarding the information given to you following your procedure. If we do not reach you, we will leave a message.  However, if you are feeling well and you are not experiencing any problems, there is no need to return our call.  We will assume that you have returned to your regular daily activities without incident.  If any biopsies were taken you will be contacted by phone or by letter within the next 1-3 weeks.  Please call us at [phone] if you have not heard about the biopsies in 3 weeks.    SIGNATURES/CONFIDENTIALITY: You and/or your care partner have signed paperwork which will be entered into your electronic medical record.  These signatures attest to the fact that that the information above on your After Visit Summary has been reviewed and is understood.  Full responsibility of the confidentiality of this discharge information lies with you and/or your care-partner.    Handouts were given to your care partner on polyps and diverticulosis. Your blood sugar in admitting was 126.  You may resume your current medications today. Await biopsy results. Please call if any questions or concerns.

## 2017-01-21 ENCOUNTER — Telehealth: Payer: Self-pay | Admitting: *Deleted

## 2017-01-21 NOTE — Telephone Encounter (Signed)
  Follow up Call-  Call back number 01/20/2017  Post procedure Call Back phone  # [phone]   Permission to leave phone message Yes  Some recent data might be hidden     Patient questions:  Do you have a fever, pain , or abdominal swelling? No. Pain Score  0 *  Have you tolerated food without any problems? Yes.    Have you been able to return to your normal activities? Yes.    Do you have any questions about your discharge instructions: Diet   No. Medications  No. Follow up visit  No.  Do you have questions or concerns about your Care? No.  Actions: * If pain score is 4 or above: No action needed, pain <4.

## 2017-01-26 ENCOUNTER — Encounter: Payer: Self-pay | Admitting: Gastroenterology

## 2017-02-02 ENCOUNTER — Telehealth: Payer: Self-pay | Admitting: Family Medicine

## 2017-02-02 ENCOUNTER — Other Ambulatory Visit (INDEPENDENT_AMBULATORY_CARE_PROVIDER_SITE_OTHER): Payer: PPO

## 2017-02-02 DIAGNOSIS — E7800 Pure hypercholesterolemia, unspecified: Secondary | ICD-10-CM

## 2017-02-02 DIAGNOSIS — E119 Type 2 diabetes mellitus without complications: Secondary | ICD-10-CM

## 2017-02-02 DIAGNOSIS — M810 Age-related osteoporosis without current pathological fracture: Secondary | ICD-10-CM

## 2017-02-02 LAB — HEMOGLOBIN A1C: Hgb A1c MFr Bld: 5.8 % (ref 4.6–6.5)

## 2017-02-02 LAB — LIPID PANEL
Cholesterol: 217 mg/dL — ABNORMAL HIGH (ref 0–200)
HDL: 62.7 mg/dL (ref >39.00)
LDL Cholesterol: 123 mg/dL — ABNORMAL HIGH (ref 0–99)
NonHDL: 154.15
Total CHOL/HDL Ratio: 3
Triglycerides: 157 mg/dL — ABNORMAL HIGH (ref 0.0–149.0)
VLDL: 31.4 mg/dL (ref 0.0–40.0)

## 2017-02-02 LAB — COMPREHENSIVE METABOLIC PANEL
ALT: 21 U/L (ref 0–35)
AST: 23 U/L (ref 0–37)
Albumin: 4.6 g/dL (ref 3.5–5.2)
Alkaline Phosphatase: 65 U/L (ref 39–117)
BUN: 19 mg/dL (ref 6–23)
CO2: 24 mEq/L (ref 19–32)
Calcium: 10.4 mg/dL (ref 8.4–10.5)
Chloride: 105 mEq/L (ref 96–112)
Creatinine, Ser: 0.7 mg/dL (ref 0.40–1.20)
GFR: 87.2 mL/min (ref >60.00)
Glucose, Bld: 105 mg/dL — ABNORMAL HIGH (ref 70–99)
Potassium: 5.5 mEq/L — ABNORMAL HIGH (ref 3.5–5.1)
Sodium: 139 mEq/L (ref 135–145)
Total Bilirubin: 0.5 mg/dL (ref 0.2–1.2)
Total Protein: 7.4 g/dL (ref 6.0–8.3)

## 2017-02-02 NOTE — Telephone Encounter (Signed)
-----   Message from Ellamae Sia sent at 01/25/2017  3:05 PM EDT ----- Regarding: Lab orders for Tuesday, 10.23.18 Lab orders for a 6 month follow up appt

## 2017-02-04 ENCOUNTER — Ambulatory Visit
Admission: RE | Admit: 2017-02-04 | Discharge: 2017-02-04 | Disposition: A | Discharge status: Home / Self Care | Payer: PPO | Source: Ambulatory Visit | Attending: Family Medicine | Admitting: Family Medicine

## 2017-02-04 ENCOUNTER — Ambulatory Visit: Payer: Self-pay | Admitting: *Deleted

## 2017-02-04 DIAGNOSIS — M810 Age-related osteoporosis without current pathological fracture: Secondary | ICD-10-CM

## 2017-02-04 NOTE — Telephone Encounter (Signed)
Gave her the results of her bone density test.   She stated she,  "She already has an appt for next Tuesday".  So she will discuss the possibility of medication for this at this appt.

## 2017-02-04 NOTE — Addendum Note (Signed)
Addended by: Marijo Conception on: 02/04/2017 01:40 PM   Modules accepted: Miquel Dunn

## 2017-02-09 ENCOUNTER — Ambulatory Visit: Payer: PPO | Admitting: Family Medicine

## 2017-02-11 ENCOUNTER — Encounter: Payer: Self-pay | Admitting: Family Medicine

## 2017-02-11 ENCOUNTER — Ambulatory Visit (INDEPENDENT_AMBULATORY_CARE_PROVIDER_SITE_OTHER): Payer: PPO | Admitting: Family Medicine

## 2017-02-11 VITALS — BP 122/62 | HR 83 | Temp 98.2°F | Ht 59.5 in | Wt 127.1 lb

## 2017-02-11 DIAGNOSIS — F17200 Nicotine dependence, unspecified, uncomplicated: Secondary | ICD-10-CM | POA: Diagnosis not present

## 2017-02-11 DIAGNOSIS — E875 Hyperkalemia: Secondary | ICD-10-CM | POA: Diagnosis not present

## 2017-02-11 DIAGNOSIS — E119 Type 2 diabetes mellitus without complications: Secondary | ICD-10-CM

## 2017-02-11 DIAGNOSIS — M810 Age-related osteoporosis without current pathological fracture: Secondary | ICD-10-CM | POA: Diagnosis not present

## 2017-02-11 DIAGNOSIS — E7800 Pure hypercholesterolemia, unspecified: Secondary | ICD-10-CM | POA: Diagnosis not present

## 2017-02-11 DIAGNOSIS — Z23 Encounter for immunization: Secondary | ICD-10-CM

## 2017-02-11 DIAGNOSIS — R809 Proteinuria, unspecified: Secondary | ICD-10-CM

## 2017-02-11 LAB — HM DIABETES FOOT EXAM

## 2017-02-11 MED ORDER — LISINOPRIL 5 MG PO TABS: 2.5000 mg | ORAL_TABLET | Freq: Every day | ORAL | 3 refills | Status: DC

## 2017-02-11 NOTE — Progress Notes (Signed)
Subjective:    Patient ID: Katelyn Kennedy, female    DOB: 1945/05/16, 71 y.o.   MRN: [id number]  HPI    71 year old female present for 6 month follow up.  Diabetes:   Lab Results  Component Value Date   HGBA1C 5.8 02/02/2017  Using medications without difficulties: Hypoglycemic episodes: none Hyperglycemic episodes: none Feet problems: no ulcers Blood Sugars averaging: not checking eye exam within last year: yes   On ACEI for microalbumin  Hyperpotassemia... No potassium supplement.  Eats half banana and blueberries daily. On mulitvitamin.  Elevated Cholesterol:  Not at goal on lipitor 20 mg daily  Lab Results  Component Value Date   CHOL 217 (H) 02/02/2017   HDL 62.70 02/02/2017   LDLCALC 123 (H) 02/02/2017   LDLDIRECT 118.7 01/24/2013   TRIG 157.0 (H) 02/02/2017   CHOLHDL 3 02/02/2017  Using medications without problems: Muscle aches:  Diet compliance: not eating as well as previously Exercise: walking Other complaints:   Blood pressure 122/62, pulse 83, temperature 98.2 F (36.8 C), temperature source Oral, height 4' 11.5" (1.511 m), weight 127 lb 1.9 oz (57.7 kg), SpO2 95 %.    Review of Systems  Constitutional: Negative for fatigue and fever.  HENT: Negative for congestion.   Eyes: Negative for pain.  Respiratory: Negative for cough and shortness of breath.   Cardiovascular: Negative for chest pain, palpitations and leg swelling.  Gastrointestinal: Negative for abdominal pain.  Genitourinary: Negative for dysuria and vaginal bleeding.  Musculoskeletal: Negative for back pain.  Neurological: Negative for syncope, light-headedness and headaches.  Psychiatric/Behavioral: Negative for dysphoric mood.       Objective:   Physical Exam  Constitutional: Vital signs are normal. She appears well-developed and well-nourished. She is cooperative.  Non-toxic appearance. She does not appear ill. No distress.  HENT:  Head: Normocephalic.  Right Ear: Hearing,  tympanic membrane, external ear and ear canal normal. Tympanic membrane is not erythematous, not retracted and not bulging.  Left Ear: Hearing, tympanic membrane, external ear and ear canal normal. Tympanic membrane is not erythematous, not retracted and not bulging.  Nose: No mucosal edema or rhinorrhea. Right sinus exhibits no maxillary sinus tenderness and no frontal sinus tenderness. Left sinus exhibits no maxillary sinus tenderness and no frontal sinus tenderness.  Mouth/Throat: Uvula is midline, oropharynx is clear and moist and mucous membranes are normal.  Eyes: Pupils are equal, round, and reactive to light. Conjunctivae, EOM and lids are normal. Lids are everted and swept, no foreign bodies found.  Neck: Trachea normal and normal range of motion. Neck supple. Carotid bruit is not present. No thyroid mass and no thyromegaly present.  Cardiovascular: Normal rate, regular rhythm, S1 normal, S2 normal, normal heart sounds, intact distal pulses and normal pulses.  Exam reveals no gallop and no friction rub.   No murmur heard. Pulmonary/Chest: Effort normal and breath sounds normal. No tachypnea. No respiratory distress. She has no decreased breath sounds. She has no wheezes. She has no rhonchi. She has no rales.  Abdominal: Soft. Normal appearance and bowel sounds are normal. There is no tenderness.  Neurological: She is alert.  Skin: Skin is warm, dry and intact. No rash noted.  Psychiatric: Her speech is normal and behavior is normal. Judgment and thought content normal. Her mood appears not anxious. Cognition and memory are normal. She does not exhibit a depressed mood.     Diabetic foot exam: Normal inspection except for plantar wart, left sole No skin breakdown  No calluses  Normal DP pulses Normal sensation to light touch and monofilament Nails normal      Assessment & Plan:

## 2017-02-11 NOTE — Patient Instructions (Addendum)
Stop banana, hold multivitamin.  Return for potassium in 1-2 weeks.  Conitnue Lipitor but get back on track with low cholesterol diet.

## 2017-02-11 NOTE — Assessment & Plan Note (Signed)
Smoking cessation instruction/counseling given:  counseled patient on the dangers of tobacco use, advised patient to stop smoking, and reviewed strategies to maximize success 

## 2017-02-11 NOTE — Assessment & Plan Note (Signed)
Diet controlled.  

## 2017-02-11 NOTE — Assessment & Plan Note (Signed)
Worsening.. Off fosamax x 6 months.  Will stay off but recheck in 1 years. Recommend weight bearing exercise, calcium in diet and vit D supplement 400 IU 1-2 times daily.

## 2017-02-11 NOTE — Assessment & Plan Note (Signed)
Get back on track with diet and exercice.. If not at goal in 6 months will need to increase lipitor.

## 2017-02-11 NOTE — Assessment & Plan Note (Signed)
Likely multifactorial due to ACEI, high potassium diet and  Multivitamin.

## 2017-02-15 ENCOUNTER — Encounter: Payer: Self-pay | Admitting: Family Medicine

## 2017-02-23 ENCOUNTER — Telehealth: Payer: Self-pay | Admitting: Family Medicine

## 2017-02-23 DIAGNOSIS — E875 Hyperkalemia: Secondary | ICD-10-CM

## 2017-02-23 NOTE — Telephone Encounter (Signed)
-----   Message from Ellamae Sia sent at 02/15/2017  3:27 PM EST ----- Regarding: Lab orders for Wednesday, 11.14.18 Lab orders, no f/u appt

## 2017-02-25 ENCOUNTER — Other Ambulatory Visit: Payer: PPO

## 2017-03-02 ENCOUNTER — Other Ambulatory Visit (INDEPENDENT_AMBULATORY_CARE_PROVIDER_SITE_OTHER): Payer: PPO

## 2017-03-02 DIAGNOSIS — E875 Hyperkalemia: Secondary | ICD-10-CM

## 2017-03-02 LAB — POTASSIUM: Potassium: 4.8 mEq/L (ref 3.5–5.1)

## 2017-03-15 ENCOUNTER — Other Ambulatory Visit: Payer: Self-pay | Admitting: *Deleted

## 2017-03-15 NOTE — Patient Outreach (Signed)
[facility]) Care Management  03/15/2017  Katelyn Kennedy Oct 05, 1945 [id number]   Health Risk assessment call  Unsuccessful attempt to contact patient, able to leave a HIPAA compliant message requesting a return call. Will  Plan follow up call within this week.   Joylene Draft, RN, [place] Management Coordinator  [phone]- Mobile [phone]- Toll Free Main Office

## 2017-03-16 ENCOUNTER — Other Ambulatory Visit: Payer: Self-pay | Admitting: *Deleted

## 2017-03-16 ENCOUNTER — Encounter: Payer: Self-pay | Admitting: Family Medicine

## 2017-03-16 ENCOUNTER — Ambulatory Visit: Payer: PPO | Admitting: Family Medicine

## 2017-03-16 ENCOUNTER — Other Ambulatory Visit: Payer: Self-pay

## 2017-03-16 ENCOUNTER — Ambulatory Visit: Payer: Self-pay | Admitting: *Deleted

## 2017-03-16 VITALS — BP 120/64 | HR 77 | Temp 98.3°F | Ht 59.5 in | Wt 126.8 lb

## 2017-03-16 DIAGNOSIS — R310 Gross hematuria: Secondary | ICD-10-CM | POA: Diagnosis not present

## 2017-03-16 LAB — POC URINALSYSI DIPSTICK (AUTOMATED)
Bilirubin, UA: NEGATIVE
Glucose, UA: NEGATIVE
Ketones, UA: NEGATIVE
Nitrite, UA: NEGATIVE
Spec Grav, UA: 1.015 (ref 1.010–1.025)
Urobilinogen, UA: 0.2 E.U./dL
pH, UA: 6 (ref 5.0–8.0)

## 2017-03-16 LAB — POCT UA - MICROSCOPIC ONLY

## 2017-03-16 NOTE — Telephone Encounter (Signed)
Dr Diona Browner had cancellation today and I spoke with pt and she rescheduled appt to 03/16/17 at 3:15 with Dr Diona Browner. FYI to Dr Diona Browner.

## 2017-03-16 NOTE — Assessment & Plan Note (Addendum)
No sign of infection or stones.  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  Hold Ibuprofen.

## 2017-03-16 NOTE — Telephone Encounter (Addendum)
I called and spoke with pt; pt having pain in lt hip but no back pain and no burning or pain upon urination, no frequency but pt has not voided since saw blood a couple of hours ago.No abd pain. Pt said when voided 2 hrs ago there was  blood in commode and when she wiped there was bright red blood on tissue. But when wiped again there was no blood. Offered appt at another [facility] site but pt said no; pt said if bleeding continues or begins with abd pain pt will go to [facility] or ED. Otherwise will keep appt with Dr Lorelei Pont on 03/17/17.

## 2017-03-16 NOTE — Patient Outreach (Signed)
[place] [facility]) Care Management  03/16/2017  Katelyn Kennedy 08-Apr-1946 [id number]   Health Risk screening call  Unsuccessful attempt to contact, able to leave a HIPAA compliant message requesting a return call. Will plan return call within a week.   Joylene Draft, RN, [place] Management Coordinator  [phone]- Mobile [phone]- Toll Free Main Office

## 2017-03-16 NOTE — Progress Notes (Signed)
Subjective:    Patient ID: Katelyn Kennedy, female    DOB: [birth year], 71 y.o.   MRN: [id number]  Hematuria  This is a new problem. The current episode started today. The problem has been gradually worsening since onset. She describes the hematuria as gross hematuria. She is experiencing no pain (no dysuria). She describes her urine color as light pink. Irritative symptoms do not include frequency, nocturia or urgency. Obstructive symptoms do not include incomplete emptying or an intermittent stream. Pertinent negatives include no abdominal pain, chills, dysuria, facial swelling, fever, flank pain, genital pain, inability to urinate, nausea or vomiting. (Headache  no new swelling in legs.) She is not sexually active. Her past medical history is significant for tobacco use. There is no history of hypertension, kidney stones or recent infection.   She does not feel blood from vaginal area or rectal area.   She still has uterus.  She has been taking ibuprofen daily 200 mg for HA in last 2 weeks.   no family history of kidney or bladder issues  no recent URI  no recent vigorous exercise or trauma On no blood thinners  Blood pressure 120/64, pulse 77, temperature 98.3 F (36.8 C), temperature source Oral, height 4' 11.5" (1.511 m), weight 126 lb 12 oz (57.5 kg).  Smoker Hx of DM Review of Systems  Constitutional: Negative for chills and fever.  HENT: Negative for facial swelling.   Gastrointestinal: Negative for abdominal pain, nausea and vomiting.  Genitourinary: Positive for hematuria. Negative for dysuria, flank pain, frequency, incomplete emptying, nocturia and urgency.       Objective:   Physical Exam  Constitutional: Vital signs are normal. She appears well-developed and well-nourished. She is cooperative.  Non-toxic appearance. She does not appear ill. No distress.  HENT:  Head: Normocephalic.  Right Ear: Hearing, tympanic membrane, external ear and ear canal normal. Tympanic  membrane is not erythematous, not retracted and not bulging.  Left Ear: Hearing, tympanic membrane, external ear and ear canal normal. Tympanic membrane is not erythematous, not retracted and not bulging.  Nose: No mucosal edema or rhinorrhea. Right sinus exhibits no maxillary sinus tenderness and no frontal sinus tenderness. Left sinus exhibits no maxillary sinus tenderness and no frontal sinus tenderness.  Mouth/Throat: Uvula is midline, oropharynx is clear and moist and mucous membranes are normal.  Eyes: Conjunctivae, EOM and lids are normal. Pupils are equal, round, and reactive to light. Lids are everted and swept, no foreign bodies found.  Neck: Trachea normal and normal range of motion. Neck supple. Carotid bruit is not present. No thyroid mass and no thyromegaly present.  Cardiovascular: Normal rate, regular rhythm, S1 normal, S2 normal, normal heart sounds, intact distal pulses and normal pulses. Exam reveals no gallop and no friction rub.  No murmur heard. Pulmonary/Chest: Effort normal and breath sounds normal. No tachypnea. No respiratory distress. She has no decreased breath sounds. She has no wheezes. She has no rhonchi. She has no rales.  Abdominal: Soft. Normal appearance and bowel sounds are normal. There is no tenderness.  Genitourinary: Vagina normal. Rectal exam shows no external hemorrhoid, no internal hemorrhoid and guaiac negative stool. No erythema, tenderness or bleeding in the vagina. No foreign body in the vagina. No signs of injury around the vagina. No vaginal discharge found.  Neurological: She is alert.  Skin: Skin is warm, dry and intact. No rash noted.  Psychiatric: Her speech is normal and behavior is normal. Judgment and thought content normal. Her mood appears  not anxious. Cognition and memory are normal. She does not exhibit a depressed mood.          Assessment & Plan:

## 2017-03-16 NOTE — Telephone Encounter (Signed)
Pt called complaining of bloody urine; she states that she noticed it when she wipped after her second voiding of the morning; pt further states that "it could be compared to a menstrual flow and that there was blood in the toilet"; Pt stated that she has been taking ibuprofen intermittently for a headache that comes and goes over the past 2 weeks; pt offered and accepted appointment with Dr Lorelei Pont on 03/17/17 at 1100; pt verbalizes understanding; will route to [place] pool to notify them of this upcoming appointment. Reason for Disposition . Blood in urine  (Exception: could be normal menstrual bleeding)  Answer Assessment - Initial Assessment Questions 1. COLOR of URINE: "Describe the color of the urine."  (e.g., tea-colored, pink, red, blood clots, bloody)     red 2. ONSET: "When did the bleeding start?"      This am 03/16/17 3. EPISODES: "How many times has there been blood in the urine?" or "How many times today?"     once 4. PAIN with URINATION: "Is there any pain with passing your urine?" If so, ask: "How bad is the pain?"  (Scale 1-10; or mild, moderate, severe)    - MILD - complains slightly about urination hurting    - MODERATE - interferes with normal activities      - SEVERE - excruciating, unwilling or unable to urinate because of the pain      no 5. FEVER: "Do you have a fever?" If so, ask: "What is your temperature, how was it measured, and when did it start?"     no 6. ASSOCIATED SYMPTOMS: "Are you passing urine more frequently than usual?"     Yes got up about 3 times last night; normally goes twice 7. OTHER SYMPTOMS: "Do you have any other symptoms?" (e.g., back/flank pain, abdominal pain, vomiting)     no  8. PREGNANCY: "Is there any chance you are pregnant?" "When was your last menstrual period?"     no  Protocols used: URINE - BLOOD IN-A-AH

## 2017-03-16 NOTE — Telephone Encounter (Signed)
Agreed pt to be seen today.

## 2017-03-16 NOTE — Patient Instructions (Addendum)
Please stop at the lab to have labs drawn.  Stop smoking.  Hold ibuprofen.. Use tylenol instead for headache.

## 2017-03-17 ENCOUNTER — Ambulatory Visit: Payer: PPO | Admitting: Family Medicine

## 2017-03-17 LAB — BASIC METABOLIC PANEL
BUN: 18 mg/dL (ref 6–23)
CO2: 27 mEq/L (ref 19–32)
Calcium: 10.4 mg/dL (ref 8.4–10.5)
Chloride: 101 mEq/L (ref 96–112)
Creatinine, Ser: 0.73 mg/dL (ref 0.40–1.20)
GFR: 83.05 mL/min (ref >60.00)
Glucose, Bld: 101 mg/dL — ABNORMAL HIGH (ref 70–99)
Potassium: 4.4 mEq/L (ref 3.5–5.1)
Sodium: 137 mEq/L (ref 135–145)

## 2017-03-17 LAB — CBC WITH DIFFERENTIAL/PLATELET
Basophils Absolute: 0.1 10*3/uL (ref 0.0–0.1)
Basophils Relative: 1.2 % (ref 0.0–3.0)
Eosinophils Absolute: 0.1 10*3/uL (ref 0.0–0.7)
Eosinophils Relative: 1 % (ref 0.0–5.0)
HCT: 44.1 % (ref 36.0–46.0)
Hemoglobin: 15 g/dL (ref 12.0–15.0)
Lymphocytes Relative: 26.5 % (ref 12.0–46.0)
Lymphs Abs: 2.6 10*3/uL (ref 0.7–4.0)
MCHC: 33.9 g/dL (ref 30.0–36.0)
MCV: 97.8 fl (ref 78.0–100.0)
Monocytes Absolute: 0.8 10*3/uL (ref 0.1–1.0)
Monocytes Relative: 8.4 % (ref 3.0–12.0)
Neutro Abs: 6.2 10*3/uL (ref 1.4–7.7)
Neutrophils Relative %: 62.9 % (ref 43.0–77.0)
Platelets: 297 10*3/uL (ref 150.0–400.0)
RBC: 4.51 Mil/uL (ref 3.87–5.11)
RDW: 13 % (ref 11.5–15.5)
WBC: 9.8 10*3/uL (ref 4.0–10.5)

## 2017-03-18 ENCOUNTER — Encounter: Payer: Self-pay | Admitting: *Deleted

## 2017-03-18 ENCOUNTER — Other Ambulatory Visit: Payer: Self-pay | Admitting: *Deleted

## 2017-03-18 NOTE — Patient Outreach (Signed)
[facility]) Care Management  03/18/2017  Katelyn Kennedy 1945-08-12 [id number]   Health Risk Assessment  3rd Unsuccessful attempt to contact patient for health screening follow up. Will send unsuccessful outreach  letter.   Joylene Draft, RN, Agency Management Coordinator  [phone]- Mobile [phone]- Toll Free Main Office

## 2017-03-19 ENCOUNTER — Telehealth: Payer: Self-pay | Admitting: Family Medicine

## 2017-03-19 DIAGNOSIS — R319 Hematuria, unspecified: Secondary | ICD-10-CM

## 2017-03-19 NOTE — Telephone Encounter (Signed)
-----   Message from Carter Kitten, [place] sent at 03/18/2017 10:58 AM EST ----- Ms. Jerline Pain notified as instructed by telephone.  She is agreeable to urology referral.  Prefers [place].

## 2017-03-23 ENCOUNTER — Ambulatory Visit: Payer: Self-pay | Admitting: Urology

## 2017-03-23 ENCOUNTER — Ambulatory Visit: Payer: Self-pay | Admitting: *Deleted

## 2017-03-23 NOTE — Telephone Encounter (Signed)
Pt  Had  An  Appointment  Today with  Urologist  Dr  Bernardo Heater   She  Canceled  The  appt  Due  To weather     She     Reports  She  Had to  [facility]  The  Appointment  Due  To  Weather . She  Reports she did notice  Some  New  Blood  When  She  Wiped  This  Am  She  Reports  As   Well some  Slight  Discomfort  As   Well  After she  Urinates.   She  Denies  Any  Other  Symptoms . [name]  At  Dr Bernardo Heater     Office  Notified   She   Rescheduled  Patients  appt  Til    03/25/11 2018  At   1015  Am  Pt  Notified   Agrees  And  Will  Be  There .  Reason for Disposition . All other urine symptoms . Bad or foul-smelling urine  Answer Assessment - Initial Assessment Questions 1. SYMPTOM: "What's the main symptom you're concerned about?" (e.g., frequency, incontinence)     BLOOD  IN  URINE   DARKER    2. ONSET: "When did the  ________  start?"     1   WEEK   3. PAIN: "Is there any pain?" If so, ask: "How bad is it?" (Scale: 1-10; mild, moderate, severe)       NO  4. CAUSE: "What do you think is causing the symptoms?"       NOT  SURE  5. OTHER SYMPTOMS: "Do you have any other symptoms?" (e.g., fever, flank pain, blood in urine, pain with urination)      Mild    Sensation of  Discomfort  At  The  End  Of her  Stream   Noticed   Some  Blood   Sure    6. PREGNANCY: "Is there any chance you are pregnant?" "When was your last menstrual period?"      No  Protocols used: URINARY [facility]

## 2017-03-24 ENCOUNTER — Encounter: Payer: Self-pay | Admitting: Urology

## 2017-03-24 ENCOUNTER — Ambulatory Visit: Payer: PPO | Admitting: Urology

## 2017-03-24 VITALS — BP 132/67 | HR 94 | Ht 59.0 in | Wt 126.0 lb

## 2017-03-24 DIAGNOSIS — R3129 Other microscopic hematuria: Secondary | ICD-10-CM | POA: Diagnosis not present

## 2017-03-24 DIAGNOSIS — R310 Gross hematuria: Secondary | ICD-10-CM | POA: Diagnosis not present

## 2017-03-24 LAB — URINALYSIS, COMPLETE
Bilirubin, UA: NEGATIVE
Glucose, UA: NEGATIVE
Ketones, UA: NEGATIVE
Nitrite, UA: NEGATIVE
Protein, UA: NEGATIVE
Specific Gravity, UA: 1.01 (ref 1.005–1.030)
Urobilinogen, Ur: 0.2 mg/dL (ref 0.2–1.0)
pH, UA: 6.5 (ref 5.0–7.5)

## 2017-03-24 LAB — MICROSCOPIC EXAMINATION

## 2017-03-24 NOTE — Progress Notes (Signed)
03/24/2017 10:30 AM   Katelyn Kennedy 07-06-1945 [id number]  Referring provider: [name], MD [address] [place], [place] [address]  Chief Complaint  Patient presents with  . Hematuria    New Patient    HPI: Katelyn Kennedy is a 71 year old female seen in consultation at the request of [name] for evaluation of gross hematuria.  She had an episode of total gross painless hematuria on 03/16/2017.  She described the color of her urine as red without clots.  This resolved in less than 24 hours.  She was seen by [name] and urinalysis showed too numerous to count RBCs.  She had another episode yesterday and states her urine was light pink in color.  She denies flank, abdominal, pelvic pain.  She denies urinary frequency, urgency or dysuria.  There is no previous history of urologic problems or prior urologic evaluation.  She denies use of anticoagulants.  Approximately 6 weeks ago she was having dull left low back pain which resolved.  She has had occasional UTIs but nothing chronic.   PMH: Past Medical History:  Diagnosis Date  . Allergic rhinitis   . Arthritis   . Diabetes mellitus without complication (HCC)    diet control/no meds per pt  . History of chicken pox   . HTN (hypertension)   . Smoker     Surgical History: Past Surgical History:  Procedure Laterality Date  . ANKLE FRACTURE SURGERY  10 years ago   "hard to wake up from anesthesia" per pt  . [place]  . BREAST BIOPSY  1978   BENIGN CYST  . BREAST EXCISIONAL BIOPSY Left 2011   Benign  . BREAST EXCISIONAL BIOPSY Right 1985   Benign   . CATARACT EXTRACTION    . COLONOSCOPY  2011  . POLYPECTOMY  2011    Home Medications:  Allergies as of 03/24/2017      Reactions   Codeine Nausea Only   REACTION: nausea      Medication List        Accurate as of 03/24/17 10:30 AM. Always use your most recent med list.          atorvastatin 20 MG tablet Commonly known as:   LIPITOR TAKE 1 TABLET BY MOUTH DAILY   Calcium + D 600-200 MG-UNIT per tablet Take 1 tablet by mouth 2 (two) times daily.   fluticasone 50 MCG/ACT nasal spray Commonly known as:  FLONASE PLACE 2 SPRAYS INTO THE NOSE DAILY.   lisinopril 5 MG tablet Commonly known as:  PRINIVIL,ZESTRIL Take 0.5 tablets (2.5 mg total) by mouth daily.   multivitamin capsule Take 1 capsule by mouth daily.       Allergies:  Allergies  Allergen Reactions  . Codeine Nausea Only    REACTION: nausea    Family History: Family History  Problem Relation Age of Onset  . Diabetes Mother   . Cancer Sister 36       BREAST  . Breast cancer Sister   . Cancer Maternal Grandmother        COLON  . Colon cancer Maternal Grandmother 86  . Stomach cancer Neg Hx     Social History:  reports that she has been smoking cigarettes.  She has a 20.00 pack-year smoking history. she has never used smokeless tobacco. She reports that she drinks alcohol. She reports that she does not use drugs.  ROS: UROLOGY Frequent Urination?: Yes Hard to postpone urination?: No  Burning/pain with urination?: No Get up at night to urinate?: Yes Leakage of urine?: Yes Urine stream starts and stops?: Yes Trouble starting stream?: No Do you have to strain to urinate?: No Blood in urine?: Yes Urinary tract infection?: No Sexually transmitted disease?: No Injury to kidneys or bladder?: No Painful intercourse?: No Weak stream?: No Currently pregnant?: No Vaginal bleeding?: No Last menstrual period?: n  Gastrointestinal Nausea?: No Vomiting?: No Indigestion/heartburn?: No Diarrhea?: No Constipation?: No  Constitutional Fever: No Night sweats?: No Weight loss?: No Fatigue?: No  Skin Skin rash/lesions?: No Itching?: No  Eyes Blurred vision?: No Double vision?: No  Ears/Nose/Throat Sore throat?: No Sinus problems?: Yes  Hematologic/Lymphatic Swollen glands?: No Easy bruising?: Yes  Cardiovascular Leg  swelling?: No Chest pain?: No  Respiratory Cough?: No Shortness of breath?: No  Endocrine Excessive thirst?: No  Musculoskeletal Back pain?: No Joint pain?: No  Neurological Headaches?: No Dizziness?: No  Psychologic Depression?: No Anxiety?: No  Physical Exam: BP 132/67   Pulse 94   Ht 4\' 11"  (1.499 m)   Wt 126 lb (57.2 kg)   BMI 25.45 kg/m   Constitutional:  Alert and oriented, No acute distress. HEENT: [place] AT, moist mucus membranes.  Trachea midline, no masses. Cardiovascular: No clubbing, cyanosis, or edema. Respiratory: Normal respiratory effort, no increased work of breathing. GI: Abdomen is soft, nontender, nondistended, no abdominal masses GU: No CVA tenderness.   Skin: No rashes, bruises or suspicious lesions. Lymph: No cervical or inguinal adenopathy. Neurologic: Grossly intact, no focal deficits, moving all 4 extremities. Psychiatric: Normal mood and affect.  Laboratory Data: Lab Results  Component Value Date   WBC 9.8 03/16/2017   HGB 15.0 03/16/2017   HCT 44.1 03/16/2017   MCV 97.8 03/16/2017   PLT 297.0 03/16/2017    Lab Results  Component Value Date   CREATININE 0.73 03/16/2017    Lab Results  Component Value Date   HGBA1C 5.8 02/02/2017    Urinalysis Dipstick: 2+ blood, 2+ leukocytes, nitrite negative Microscopy: 11-30 WBC, 3-10 RBC   Assessment & Plan:    1. Gross hematuria We discussed potential causes of hematuria including both benign and less common malignant etiologies.  I recommended proceeding with a full hematuria evaluation to include CT urogram and cystoscopy.  Urinalysis today does show pyuria and a urine culture was ordered although she is asymptomatic.  - Urinalysis, Complete - CULTURE, URINE COMPREHENSIVE - CT HEMATURIA WORKUP; Future    Abbie Sons, MD  [place] [address] [place], [place] [address] [phone]

## 2017-03-26 LAB — CULTURE, URINE COMPREHENSIVE

## 2017-03-28 ENCOUNTER — Other Ambulatory Visit: Payer: Self-pay | Admitting: Urology

## 2017-03-28 MED ORDER — AMOXICILLIN 875 MG PO TABS: 875.0000 mg | ORAL_TABLET | Freq: Two times a day (BID) | ORAL | 0 refills | Status: DC

## 2017-03-29 ENCOUNTER — Telehealth: Payer: Self-pay

## 2017-03-29 NOTE — Telephone Encounter (Signed)
-----   Message from Abbie Sons, MD sent at 03/28/2017  8:47 AM EST ----- Urine cx was positive.  Antibiotic rx sent to pharm.  F/U as sched

## 2017-03-29 NOTE — Telephone Encounter (Signed)
LMOM- +ucx and abx sent to pharmacy. F/u as scheduled.

## 2017-03-31 ENCOUNTER — Ambulatory Visit: Payer: Self-pay | Admitting: Urology

## 2017-04-21 ENCOUNTER — Ambulatory Visit (HOSPITAL_COMMUNITY): Payer: PPO

## 2017-04-22 ENCOUNTER — Ambulatory Visit (HOSPITAL_COMMUNITY)
Admission: RE | Admit: 2017-04-22 | Discharge: 2017-04-22 | Disposition: A | Discharge status: Home / Self Care | Payer: PPO | Source: Ambulatory Visit | Attending: Urology | Admitting: Urology

## 2017-04-22 DIAGNOSIS — R911 Solitary pulmonary nodule: Secondary | ICD-10-CM | POA: Insufficient documentation

## 2017-04-22 DIAGNOSIS — I700 Atherosclerosis of aorta: Secondary | ICD-10-CM | POA: Diagnosis not present

## 2017-04-22 DIAGNOSIS — R310 Gross hematuria: Secondary | ICD-10-CM | POA: Insufficient documentation

## 2017-04-22 DIAGNOSIS — N3289 Other specified disorders of bladder: Secondary | ICD-10-CM | POA: Insufficient documentation

## 2017-04-22 MED ORDER — IOPAMIDOL (ISOVUE-300) INJECTION 61%
125.0000 mL | Freq: Once | INTRAVENOUS | Status: AC | PRN
  Administered 2017-04-22: 125 mL via INTRAVENOUS

## 2017-04-22 MED ORDER — IOPAMIDOL (ISOVUE-300) INJECTION 61%
INTRAVENOUS | Status: AC
  Filled 2017-04-22: qty 150

## 2017-04-22 MED ORDER — SODIUM CHLORIDE 0.9 % IV SOLN
INTRAVENOUS | Status: AC
  Filled 2017-04-22: qty 250

## 2017-04-28 ENCOUNTER — Encounter: Payer: Self-pay | Admitting: Urology

## 2017-04-28 ENCOUNTER — Ambulatory Visit: Payer: PPO | Admitting: Urology

## 2017-04-28 ENCOUNTER — Other Ambulatory Visit: Payer: Self-pay | Admitting: Radiology

## 2017-04-28 VITALS — BP 120/70 | HR 91 | Ht <= 58 in | Wt 126.0 lb

## 2017-04-28 DIAGNOSIS — D494 Neoplasm of unspecified behavior of bladder: Secondary | ICD-10-CM

## 2017-04-28 DIAGNOSIS — R310 Gross hematuria: Secondary | ICD-10-CM | POA: Diagnosis not present

## 2017-04-28 LAB — URINALYSIS, COMPLETE
Bilirubin, UA: NEGATIVE
Glucose, UA: NEGATIVE
Ketones, UA: NEGATIVE
Nitrite, UA: NEGATIVE
Protein, UA: NEGATIVE
Specific Gravity, UA: <1.005 — ABNORMAL LOW (ref 1.005–1.030)
Urobilinogen, Ur: 0.2 mg/dL (ref 0.2–1.0)
pH, UA: 5.5 (ref 5.0–7.5)

## 2017-04-28 LAB — MICROSCOPIC EXAMINATION
Epithelial Cells (non renal): NONE SEEN /hpf (ref 0–10)
WBC, UA: NONE SEEN /hpf (ref 0–?)

## 2017-04-28 MED ORDER — CIPROFLOXACIN HCL 500 MG PO TABS
500.0000 mg | ORAL_TABLET | Freq: Once | ORAL | Status: AC
  Administered 2017-04-28: 500 mg via ORAL

## 2017-04-28 MED ORDER — LIDOCAINE HCL 2 % EX GEL
1.0000 "application " | Freq: Once | CUTANEOUS | Status: AC
  Administered 2017-04-28: 1 via URETHRAL

## 2017-04-28 NOTE — Progress Notes (Signed)
04/28/17  CC:  Chief Complaint  Patient presents with  . Cysto    HPI: Initially seen 03/24/2017 for an episode of total gross painless hematuria.  Blood pressure 120/70, pulse 91, height 4' (1.219 m), weight 126 lb (57.2 kg). NED. A&Ox3.     Cystoscopy Procedure Note  Patient identification was confirmed, informed consent was obtained, and patient was prepped using Betadine solution.  Lidocaine jelly was administered per urethral meatus.    Preoperative abx where received prior to procedure.    Procedure: - Flexible cystoscope introduced, without any difficulty.   - Thorough search of the bladder revealed:    normal urethral meatus    normal urothelium    no stones    no ulcers     ~1 cm papillary lesion left hemitrigone just medial to the left ureteral orifice.  Grossly appears high-grade    no urethral polyps    no trabeculation  - Ureteral orifices were normal in position and appearance.  Post-Procedure: - Patient tolerated the procedure well  Imaging: CT personally reviewed. CLINICAL DATA:  Gross hematuria  EXAM: CT ABDOMEN AND PELVIS WITHOUT AND WITH CONTRAST  TECHNIQUE: Multidetector CT imaging of the abdomen and pelvis was performed following the standard protocol before and following the bolus administration of intravenous contrast.  CONTRAST:  161mL ISOVUE-300 IOPAMIDOL (ISOVUE-300) INJECTION 61%  COMPARISON:  None.  FINDINGS: Lower chest: 1.8 x 3.4 x 2.0 cm soft tissue lesion is identified in the the right lower lobe (image 11 series 4). 8 mm posterior right lower lobe pulmonary nodule is seen on image 26.  Hepatobiliary: 5 mm hypoattenuating lesion posterior right liver is too small to characterize but likely benign. 11 mm low-density lesion in the lateral segment of the left liver is likely a cyst. There is no evidence for gallstones, gallbladder wall thickening, or pericholecystic fluid. No intrahepatic or extrahepatic  biliary dilation.  Pancreas: No focal mass lesion. No dilatation of the main duct. No intraparenchymal cyst. No peripancreatic edema.  Spleen: No splenomegaly. No focal mass lesion.  Adrenals/Urinary Tract: No adrenal nodule or mass.  Precontrast imaging shows no stones in either kidney or ureter. No bladder stones.  Imaging after IV contrast administration shows no suspicious enhancing lesion in either kidney. There is no wall thickening or soft tissue filling defect in either intrarenal collecting system or renal pelvis. Ureters are well opacified and normal in appearance.  10 mm polypoid lesion posterior left bladder wall is visible on all 3 sequences, but is best demonstrated on axial image 71 of series 13.  Stomach/Bowel: Tiny hiatal hernia. Stomach otherwise unremarkable. Duodenum is normally positioned as is the ligament of Treitz. No small bowel wall thickening. No small bowel dilatation. The terminal ileum is normal. The appendix is normal. Diverticular changes are noted in the left colon without evidence of diverticulitis.  Vascular/Lymphatic: There is abdominal aortic atherosclerosis without aneurysm. There is no gastrohepatic or hepatoduodenal ligament lymphadenopathy. No intraperitoneal or retroperitoneal lymphadenopathy. No pelvic sidewall lymphadenopathy.  Reproductive: The uterus has normal CT imaging appearance. There is no adnexal mass.  Other: No intraperitoneal free fluid.  Musculoskeletal: Bone windows reveal no worrisome lytic or sclerotic osseous lesions.  IMPRESSION: 1. 10 mm polypoid lesion posterior left bladder wall, highly suspicious for urothelial neoplasm. 2. 3.4 cm soft tissue lesion in the inferior right lower lobe. Primary bronchogenic neoplasm a concern. There is a small associated nodule in the posterior right lower lobe. Dedicated CT chest with contrast recommended to further evaluate.  3.  Aortic Atherosclerois  (ICD10-170.0)  These results will be called to the ordering clinician or representative by the Radiologist Assistant, and communication documented in the PACS or zVision Dashboard.   Electronically Signed   By: Misty Stanley M.D.   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  The findings were discussed in detail with Ms. Jerline Pain.  I have recommended scheduling TURBT.  The procedure was discussed in detail including potential risks of bleeding, infection, bladder injury/perforation as well as the risks of anesthesia.  I discussed post resection intravesical chemotherapy and she elects to proceed.  She was also incidentally found to have a 3 cm soft tissue lesion in the inferior right lobe of the lung.  She will need further evaluation.    Abbie Sons, MD

## 2017-04-29 ENCOUNTER — Encounter: Payer: Self-pay | Admitting: Family Medicine

## 2017-04-29 ENCOUNTER — Telehealth: Payer: Self-pay | Admitting: Family Medicine

## 2017-04-29 ENCOUNTER — Encounter: Payer: Self-pay | Admitting: Urology

## 2017-04-29 ENCOUNTER — Ambulatory Visit: Payer: Self-pay | Admitting: Urology

## 2017-04-29 DIAGNOSIS — D494 Neoplasm of unspecified behavior of bladder: Secondary | ICD-10-CM

## 2017-04-29 DIAGNOSIS — C3431 Malignant neoplasm of lower lobe, right bronchus or lung: Secondary | ICD-10-CM | POA: Insufficient documentation

## 2017-04-29 NOTE — H&P (Deleted)
  The note originally documented on this encounter has been moved the the encounter in which it belongs.  

## 2017-04-29 NOTE — Telephone Encounter (Signed)
Ms. Winterrowd notified as instructed by telephone.  She was unaware that there was anything going on with her lungs.  Dr. Bernardo Heater had not told her any of this.

## 2017-04-29 NOTE — Telephone Encounter (Signed)
Let pt know I have reviewed notes and plan of Dr Bernardo Heater. ONce lesion in bladder treated dshe can call us to scheduled CT lung if not ordered by URO to assess incidental finding in lung.  Pt should already be aware: 3.4 cm soft tissue lesion in the inferior right lower lobe. Primary bronchogenic neoplasm a concern. There is a small associated nodule in the posterior right lower lobe. Dedicated CT chest with contrast recommended to further evaluate.

## 2017-04-29 NOTE — H&P (Signed)
04/29/2017 7:16 AM   Katelyn Kennedy [birth year] [id number]  Referring provider: No referring provider defined for this encounter.   HPI: Katelyn Kennedy is a 72 year old female recently seen for evaluation of an episode of total gross painless hematuria.  CT showed an approximately 1 cm bladder lesion and cystoscopy confirmed a 1 cm papillary tumor on the left hemitrigone medial to the left ureteral orifice.  She presents for transurethral resection bladder tumor.  She was also incidentally discovered to have a 3 cm lung mass.   PMH: Past Medical History:  Diagnosis Date  . Allergic rhinitis   . Arthritis   . Diabetes mellitus without complication (HCC)    diet control/no meds per pt  . History of chicken pox   . HTN (hypertension)   . Smoker     Surgical History: Past Surgical History:  Procedure Laterality Date  . ANKLE FRACTURE SURGERY  10 years ago   "hard to wake up from anesthesia" per pt  . [place]  . BREAST BIOPSY  1978   BENIGN CYST  . BREAST EXCISIONAL BIOPSY Left 2011   Benign  . BREAST EXCISIONAL BIOPSY Right 1985   Benign   . CATARACT EXTRACTION    . COLONOSCOPY  2011  . POLYPECTOMY  2011    Home Medications:  Allergies as of 04/29/2017      Reactions   Codeine Nausea Only      Medication List        Accurate as of 04/29/17  7:16 AM. Always use your most recent med list.          acetaminophen 500 MG tablet Commonly known as:  TYLENOL Take 500 mg by mouth daily as needed for moderate pain or headache.   atorvastatin 20 MG tablet Commonly known as:  LIPITOR TAKE 1 TABLET BY MOUTH DAILY   Calcium + D 600-200 MG-UNIT per tablet Take 1 tablet by mouth daily.   fluticasone 50 MCG/ACT nasal spray Commonly known as:  FLONASE PLACE 2 SPRAYS INTO THE NOSE DAILY.   lisinopril 5 MG tablet Commonly known as:  PRINIVIL,ZESTRIL Take 0.5 tablets (2.5 mg total) by mouth daily.   multivitamin capsule Take 1 capsule by mouth  daily.   VISINE OP Place 1 drop into both eyes daily as needed (dry eyes).       Allergies:  Allergies  Allergen Reactions  . Codeine Nausea Only    Family History: Family History  Problem Relation Age of Onset  . Diabetes Mother   . Cancer Sister 23       BREAST  . Breast cancer Sister   . Cancer Maternal Grandmother        COLON  . Colon cancer Maternal Grandmother 86  . Stomach cancer Neg Hx     Social History:  reports that she has been smoking cigarettes.  She has a 20.00 pack-year smoking history. she has never used smokeless tobacco. She reports that she drinks alcohol. She reports that she does not use drugs.  ROS: No change from 03/24/2017 except as noted in the HPI  Physical Exam: There were no vitals taken for this visit.  Constitutional:  Alert and oriented, No acute distress. HEENT: [place] AT, moist mucus membranes.  Trachea midline, no masses. Cardiovascular: No clubbing, cyanosis, or edema.  CV RRR Respiratory: Normal respiratory effort, no increased work of breathing.  Lungs clear GI: Abdomen is soft, nontender, nondistended, no abdominal masses GU: No CVA tenderness.  Skin: No rashes, bruises or suspicious lesions. Lymph: No cervical or inguinal adenopathy. Neurologic: Grossly intact, no focal deficits, moving all 4 extremities. Psychiatric: Normal mood and affect.  Laboratory Data: Lab Results  Component Value Date   WBC 9.8 03/16/2017   HGB 15.0 03/16/2017   HCT 44.1 03/16/2017   MCV 97.8 03/16/2017   PLT 297.0 03/16/2017    Lab Results  Component Value Date   CREATININE 0.73 03/16/2017    Lab Results  Component Value Date   HGBA1C 5.8 02/02/2017    Urinalysis Lab Results  Component Value Date   SPECGRAV <1.005 (L) 04/28/2017   PHUR 5.5 04/28/2017   COLORU Yellow 04/28/2017   APPEARANCEUR Clear 04/28/2017   LEUKOCYTESUR Trace (A) 04/28/2017   PROTEINUR Negative 04/28/2017   GLUCOSEU Negative 04/28/2017   KETONESU Negative  04/28/2017   RBCU Trace (A) 04/28/2017   BILIRUBINUR Negative 04/28/2017   UUROB 0.2 04/28/2017   NITRITE Negative 04/28/2017    Lab Results  Component Value Date   LABMICR See below: 04/28/2017   WBCUA None seen 04/28/2017   RBCUA 0-2 04/28/2017   LABEPIT None seen 04/28/2017   MUCUS Present (A) 03/24/2017   BACTERIA Few (A) 04/28/2017    Pertinent Imaging:  Results for orders placed during the hospital encounter of 04/22/17  CT HEMATURIA WORKUP   Narrative CLINICAL DATA:  Gross hematuria  EXAM: CT ABDOMEN AND PELVIS WITHOUT AND WITH CONTRAST  TECHNIQUE: Multidetector CT imaging of the abdomen and pelvis was performed following the standard protocol before and following the bolus administration of intravenous contrast.  CONTRAST:  129mL ISOVUE-300 IOPAMIDOL (ISOVUE-300) INJECTION 61%  COMPARISON:  None.  FINDINGS: Lower chest: 1.8 x 3.4 x 2.0 cm soft tissue lesion is identified in the the right lower lobe (image 11 series 4). 8 mm posterior right lower lobe pulmonary nodule is seen on image 26.  Hepatobiliary: 5 mm hypoattenuating lesion posterior right liver is too small to characterize but likely benign. 11 mm low-density lesion in the lateral segment of the left liver is likely a cyst. There is no evidence for gallstones, gallbladder wall thickening, or pericholecystic fluid. No intrahepatic or extrahepatic biliary dilation.  Pancreas: No focal mass lesion. No dilatation of the main duct. No intraparenchymal cyst. No peripancreatic edema.  Spleen: No splenomegaly. No focal mass lesion.  Adrenals/Urinary Tract: No adrenal nodule or mass.  Precontrast imaging shows no stones in either kidney or ureter. No bladder stones.  Imaging after IV contrast administration shows no suspicious enhancing lesion in either kidney. There is no wall thickening or soft tissue filling defect in either intrarenal collecting system or renal pelvis. Ureters are well opacified  and normal in appearance.  10 mm polypoid lesion posterior left bladder wall is visible on all 3 sequences, but is best demonstrated on axial image 71 of series 13.  Stomach/Bowel: Tiny hiatal hernia. Stomach otherwise unremarkable. Duodenum is normally positioned as is the ligament of Treitz. No small bowel wall thickening. No small bowel dilatation. The terminal ileum is normal. The appendix is normal. Diverticular changes are noted in the left colon without evidence of diverticulitis.  Vascular/Lymphatic: There is abdominal aortic atherosclerosis without aneurysm. There is no gastrohepatic or hepatoduodenal ligament lymphadenopathy. No intraperitoneal or retroperitoneal lymphadenopathy. No pelvic sidewall lymphadenopathy.  Reproductive: The uterus has normal CT imaging appearance. There is no adnexal mass.  Other: No intraperitoneal free fluid.  Musculoskeletal: Bone windows reveal no worrisome lytic or sclerotic osseous lesions.  IMPRESSION: 1. 10 mm polypoid  lesion posterior left bladder wall, highly suspicious for urothelial neoplasm. 2. 3.4 cm soft tissue lesion in the inferior right lower lobe. Primary bronchogenic neoplasm a concern. There is a small associated nodule in the posterior right lower lobe. Dedicated CT chest with contrast recommended to further evaluate. 3.  Aortic Atherosclerois (ICD10-170.0)  These results will be called to the ordering clinician or representative by the Radiologist Assistant, and communication documented in the PACS or zVision Dashboard.   Electronically Signed   By: Misty Stanley M.D.   On: 04/22/2017 14:09      Assessment & Plan:   She presents for transfer with resection of bladder tumor. The procedure was discussed in detail including potential risks of bleeding, infection, bladder injury/perforation as well as the risks of anesthesia.  I discussed post resection intravesical chemotherapy and she elects to  proceed.    Abbie Sons, [place] [address] [place], [place] [address] [phone]

## 2017-04-30 ENCOUNTER — Encounter
Admission: RE | Admit: 2017-04-30 | Discharge: 2017-04-30 | Disposition: A | Discharge status: Home / Self Care | Payer: PPO | Source: Ambulatory Visit | Attending: Urology | Admitting: Urology

## 2017-04-30 ENCOUNTER — Other Ambulatory Visit: Payer: Self-pay

## 2017-04-30 DIAGNOSIS — Z01810 Encounter for preprocedural cardiovascular examination: Secondary | ICD-10-CM | POA: Diagnosis not present

## 2017-04-30 DIAGNOSIS — I10 Essential (primary) hypertension: Secondary | ICD-10-CM | POA: Diagnosis not present

## 2017-04-30 HISTORY — DX: Dyspnea, unspecified: R06.00

## 2017-04-30 NOTE — Pre-Procedure Instructions (Signed)
ekg reviewed and okayed by Dr Randa Lynn

## 2017-04-30 NOTE — Patient Instructions (Signed)
Your procedure is scheduled on: May 04, 2017 TUESDAY Report to Same Day Surgery on the 2nd floor in the [facility]. To find out your arrival time, please call [phone] between 1PM - 3PM on: MONDAY May 04, 2107  REMEMBER: Instructions that are not followed completely may result in serious medical risk, up to and including death; or upon the discretion of your surgeon and anesthesiologist your surgery may need to be rescheduled.  Do not eat food after midnight the night before your procedure.  No gum chewing or hard candies.  You may however, drink CLEAR liquids up to 2 hours before you are scheduled to arrive at the hospital for your procedure.  Do not drink clear liquids within 2 hours of the start of your surgery.  Clear liquids include: - water  Do NOT drink anything that is not on this list.  Type 1 and Type 2 diabetics should only drink water.  No Alcohol for 24 hours before or after surgery.  No Smoking including e-cigarettes for 24 hours prior to surgery. No chewable tobacco products for at least 6 hours prior to surgery. No nicotine patches on the day of surgery.  Notify your doctor if there is any change in your medical condition (cold, fever, infection).  Do not wear jewelry, make-up, hairpins, clips or nail polish.  Do not wear lotions, powders, or perfumes. You may NOT ear deodorant.  Do not shave 48 hours prior to surgery. Men may shave face and neck.  Contacts and dentures may not be worn into surgery.  Do not bring valuables to the hospital. [facility] is not responsible for any belongings or valuables.   TAKE THESE MEDICATIONS THE MORNING OF SURGERY WITH A SIP OF WATER: ATORVASTATIN  USE FLONASE IN AM OF SURGERY  Stop Anti-inflammatories such as Advil, Aleve, Ibuprofen, Motrin, Naproxen, Naprosyn, Goodie powder, or aspirin products. (May take Tylenol or Acetaminophen if needed.)  Stop ANY OVER THE COUNTER supplements until after surgery. (May  continue Vitamin D, Vitamin B, and multivitamin.)  If you are being discharged the day of surgery, you will not be allowed to drive home. You will need someone to drive you home and stay with you that night.   Please call the number above if you have any questions about these instructions.

## 2017-04-30 NOTE — Telephone Encounter (Signed)
Discussed CT in detail with patient.  Bladder tumor superficial.. To be removed on 1/22.  She will call our office when ready to schedule dedicated lung CT.

## 2017-05-01 LAB — CULTURE, URINE COMPREHENSIVE

## 2017-05-03 MED ORDER — CEFAZOLIN SODIUM-DEXTROSE 2-4 GM/100ML-% IV SOLN
2.0000 g | INTRAVENOUS | Status: AC
  Administered 2017-05-04: 2 g via INTRAVENOUS

## 2017-05-04 ENCOUNTER — Other Ambulatory Visit: Payer: Self-pay

## 2017-05-04 ENCOUNTER — Encounter: Payer: Self-pay | Admitting: *Deleted

## 2017-05-04 ENCOUNTER — Ambulatory Visit: Payer: PPO | Admitting: Anesthesiology

## 2017-05-04 ENCOUNTER — Ambulatory Visit
Admission: RE | Admit: 2017-05-04 | Discharge: 2017-05-04 | Disposition: A | Discharge status: Home / Self Care | Payer: PPO | Source: Ambulatory Visit | Attending: Urology | Admitting: Urology

## 2017-05-04 ENCOUNTER — Encounter
Admission: RE | Disposition: A | Discharge status: Home / Self Care | Payer: Self-pay | Source: Ambulatory Visit | Attending: Urology

## 2017-05-04 DIAGNOSIS — F17210 Nicotine dependence, cigarettes, uncomplicated: Secondary | ICD-10-CM | POA: Diagnosis not present

## 2017-05-04 DIAGNOSIS — E119 Type 2 diabetes mellitus without complications: Secondary | ICD-10-CM | POA: Diagnosis not present

## 2017-05-04 DIAGNOSIS — I10 Essential (primary) hypertension: Secondary | ICD-10-CM | POA: Diagnosis not present

## 2017-05-04 DIAGNOSIS — D494 Neoplasm of unspecified behavior of bladder: Secondary | ICD-10-CM | POA: Diagnosis not present

## 2017-05-04 DIAGNOSIS — Z79899 Other long term (current) drug therapy: Secondary | ICD-10-CM | POA: Diagnosis not present

## 2017-05-04 DIAGNOSIS — R310 Gross hematuria: Secondary | ICD-10-CM | POA: Insufficient documentation

## 2017-05-04 DIAGNOSIS — C674 Malignant neoplasm of posterior wall of bladder: Secondary | ICD-10-CM | POA: Diagnosis not present

## 2017-05-04 DIAGNOSIS — C679 Malignant neoplasm of bladder, unspecified: Secondary | ICD-10-CM | POA: Diagnosis not present

## 2017-05-04 HISTORY — PX: TRANSURETHRAL RESECTION OF BLADDER TUMOR WITH MITOMYCIN-C: SHX6459

## 2017-05-04 LAB — GLUCOSE, CAPILLARY
Glucose-Capillary: 112 mg/dL — ABNORMAL HIGH (ref 65–99)
Glucose-Capillary: 123 mg/dL — ABNORMAL HIGH (ref 65–99)

## 2017-05-04 SURGERY — TRANSURETHRAL RESECTION OF BLADDER TUMOR WITH MITOMYCIN-C
Anesthesia: General | Site: Bladder | Wound class: Clean Contaminated

## 2017-05-04 MED ORDER — SODIUM CHLORIDE 0.9 % IV SOLN
INTRAVENOUS | Status: DC
  Administered 2017-05-04: 09:00:00 via INTRAVENOUS

## 2017-05-04 MED ORDER — TRAMADOL HCL 50 MG PO TABS: 50.0000 mg | ORAL_TABLET | Freq: Four times a day (QID) | ORAL | 0 refills | Status: DC | PRN

## 2017-05-04 MED ORDER — LIDOCAINE HCL (PF) 2 % IJ SOLN
INTRAMUSCULAR | Status: AC
  Filled 2017-05-04: qty 10

## 2017-05-04 MED ORDER — ONDANSETRON HCL 4 MG/2ML IJ SOLN
INTRAMUSCULAR | Status: DC | PRN
  Administered 2017-05-04: 4 mg via INTRAVENOUS

## 2017-05-04 MED ORDER — ONDANSETRON HCL 4 MG/2ML IJ SOLN
4.0000 mg | Freq: Once | INTRAMUSCULAR | Status: AC | PRN
  Administered 2017-05-04: 4 mg via INTRAVENOUS

## 2017-05-04 MED ORDER — GEMCITABINE HCL CHEMO INJECTION 1 GM
2000.0000 mg | Freq: Once | INTRAVENOUS | Status: AC
  Administered 2017-05-04: 2000 mg
  Filled 2017-05-04: qty 53

## 2017-05-04 MED ORDER — FENTANYL CITRATE (PF) 100 MCG/2ML IJ SOLN
INTRAMUSCULAR | Status: AC
  Administered 2017-05-04: 25 ug via INTRAVENOUS
  Filled 2017-05-04: qty 2

## 2017-05-04 MED ORDER — FENTANYL CITRATE (PF) 100 MCG/2ML IJ SOLN
INTRAMUSCULAR | Status: DC | PRN
  Administered 2017-05-04 (×2): 50 ug via INTRAVENOUS

## 2017-05-04 MED ORDER — GLYCOPYRROLATE 0.2 MG/ML IJ SOLN
INTRAMUSCULAR | Status: DC | PRN
  Administered 2017-05-04: 0.2 mg via INTRAVENOUS

## 2017-05-04 MED ORDER — ONDANSETRON HCL 4 MG/2ML IJ SOLN
INTRAMUSCULAR | Status: AC
  Filled 2017-05-04: qty 2

## 2017-05-04 MED ORDER — FENTANYL CITRATE (PF) 100 MCG/2ML IJ SOLN
INTRAMUSCULAR | Status: AC
  Filled 2017-05-04: qty 2

## 2017-05-04 MED ORDER — FENTANYL CITRATE (PF) 100 MCG/2ML IJ SOLN
25.0000 ug | INTRAMUSCULAR | Status: AC | PRN
  Administered 2017-05-04 (×6): 25 ug via INTRAVENOUS

## 2017-05-04 MED ORDER — LIDOCAINE HCL (PF) 2 % IJ SOLN
INTRAMUSCULAR | Status: DC | PRN
  Administered 2017-05-04: 50 mg

## 2017-05-04 MED ORDER — PROPOFOL 10 MG/ML IV BOLUS
INTRAVENOUS | Status: DC | PRN
  Administered 2017-05-04: 200 mg via INTRAVENOUS

## 2017-05-04 MED ORDER — GLYCOPYRROLATE 0.2 MG/ML IJ SOLN
INTRAMUSCULAR | Status: AC
  Filled 2017-05-04: qty 1

## 2017-05-04 MED ORDER — FAMOTIDINE 20 MG PO TABS
ORAL_TABLET | ORAL | Status: AC
  Administered 2017-05-04: 20 mg
  Filled 2017-05-04: qty 1

## 2017-05-04 MED ORDER — DEXAMETHASONE SODIUM PHOSPHATE 10 MG/ML IJ SOLN
INTRAMUSCULAR | Status: DC | PRN
  Administered 2017-05-04: 5 mg via INTRAVENOUS

## 2017-05-04 MED ORDER — ONDANSETRON HCL 4 MG/2ML IJ SOLN
INTRAMUSCULAR | Status: AC
  Administered 2017-05-04: 4 mg via INTRAVENOUS
  Filled 2017-05-04: qty 2

## 2017-05-04 MED ORDER — PROPOFOL 10 MG/ML IV BOLUS
INTRAVENOUS | Status: AC
  Filled 2017-05-04: qty 20

## 2017-05-04 MED ORDER — CEFAZOLIN SODIUM-DEXTROSE 2-4 GM/100ML-% IV SOLN
INTRAVENOUS | Status: AC
  Filled 2017-05-04: qty 100

## 2017-05-04 MED ORDER — MIDAZOLAM HCL 2 MG/2ML IJ SOLN
INTRAMUSCULAR | Status: AC
  Filled 2017-05-04: qty 2

## 2017-05-04 SURGICAL SUPPLY — 25 items
BAG DRAIN CYSTO-URO LG1000N (MISCELLANEOUS) ×2 IMPLANT
BAG URINE DRAINAGE (UROLOGICAL SUPPLIES) ×2 IMPLANT
BRUSH SCRUB EZ  4% CHG (MISCELLANEOUS) ×1
BRUSH SCRUB EZ 4% CHG (MISCELLANEOUS) ×1 IMPLANT
CATH FOLEY 2WAY  5CC 16FR (CATHETERS) ×1
CATH URTH 16FR FL 2W BLN LF (CATHETERS) ×1 IMPLANT
DRAPE UTILITY 15X26 TOWEL STRL (DRAPES) ×2 IMPLANT
DRSG TELFA 4X3 1S NADH ST (GAUZE/BANDAGES/DRESSINGS) ×2 IMPLANT
ELECT LOOP 22F BIPOLAR SML (ELECTROSURGICAL) ×2
ELECT REM PT RETURN 9FT ADLT (ELECTROSURGICAL)
ELECTRODE LOOP 22F BIPOLAR SML (ELECTROSURGICAL) ×1 IMPLANT
ELECTRODE REM PT RTRN 9FT ADLT (ELECTROSURGICAL) IMPLANT
GLOVE BIO SURGEON STRL SZ 6.5 (GLOVE) ×4 IMPLANT
GOWN STRL REUS W/ TWL LRG LVL3 (GOWN DISPOSABLE) ×2 IMPLANT
GOWN STRL REUS W/TWL LRG LVL3 (GOWN DISPOSABLE) ×2
KIT RM TURNOVER CYSTO AR (KITS) ×2 IMPLANT
LOOP CUT BIPOLAR 24F LRG (ELECTROSURGICAL) IMPLANT
NDL SAFETY ECLIPSE 18X1.5 (NEEDLE) IMPLANT
NEEDLE HYPO 18GX1.5 SHARP (NEEDLE)
PACK CYSTO AR (MISCELLANEOUS) ×2 IMPLANT
SET IRRIG Y TYPE TUR BLADDER L (SET/KITS/TRAYS/PACK) ×2 IMPLANT
SET IRRIGATING DISP (SET/KITS/TRAYS/PACK) ×2 IMPLANT
SURGILUBE 2OZ TUBE FLIPTOP (MISCELLANEOUS) ×2 IMPLANT
SYRINGE IRR TOOMEY STRL 70CC (SYRINGE) ×2 IMPLANT
WATER STERILE IRR 1000ML POUR (IV SOLUTION) ×2 IMPLANT

## 2017-05-04 NOTE — Anesthesia Preprocedure Evaluation (Signed)
Anesthesia Evaluation  Patient identified by MRN, date of birth, ID band Patient awake    Reviewed: Allergy & Precautions, NPO status , Patient's Chart, lab work & pertinent test results  History of Anesthesia Complications Negative for: history of anesthetic complications  Airway Mallampati: II       Dental  (+) Partial Upper   Pulmonary neg sleep apnea, neg COPD, Current Smoker,           Cardiovascular hypertension, Pt. on medications (-) Past MI and (-) CHF (-) pacemaker(-) Valvular Problems/Murmurs     Neuro/Psych neg Seizures    GI/Hepatic neg GERD  ,  Endo/Other  diabetes (diet controlled), Type 2  Renal/GU negative Renal ROS     Musculoskeletal   Abdominal   Peds  Hematology   Anesthesia Other Findings   Reproductive/Obstetrics                             Anesthesia Physical Anesthesia Plan  ASA: II  Anesthesia Plan: MAC   Post-op Pain Management:    Induction:   PONV Risk Score and Plan:   Airway Management Planned: Nasal Cannula  Additional Equipment:   Intra-op Plan:   Post-operative Plan:   Informed Consent: I have reviewed the patients History and Physical, chart, labs and discussed the procedure including the risks, benefits and alternatives for the proposed anesthesia with the patient or authorized representative who has indicated his/her understanding and acceptance.     Plan Discussed with:   Anesthesia Plan Comments:         Anesthesia Quick Evaluation

## 2017-05-04 NOTE — Discharge Instructions (Signed)

## 2017-05-04 NOTE — Anesthesia Post-op Follow-up Note (Signed)
Anesthesia QCDR form completed.        

## 2017-05-04 NOTE — Anesthesia Procedure Notes (Signed)
Procedure Name: LMA Insertion Performed by: Rolla Plate, CRNA Pre-anesthesia Checklist: Patient identified, Patient being monitored, Timeout performed, Emergency Drugs available and Suction available Patient Re-evaluated:Patient Re-evaluated prior to induction Oxygen Delivery Method: Circle system utilized Preoxygenation: Pre-oxygenation with 100% oxygen Induction Type: IV induction Ventilation: Mask ventilation without difficulty LMA: LMA inserted LMA Size: 3.5 Tube type: Oral Number of attempts: 1 Placement Confirmation: positive ETCO2 and breath sounds checked- equal and bilateral Tube secured with: Tape Dental Injury: Teeth and Oropharynx as per pre-operative assessment

## 2017-05-04 NOTE — OR Nursing (Signed)
Foley Catheter unclamped at 1218.  Allowing to drain.

## 2017-05-04 NOTE — Op Note (Signed)
Preoperative diagnosis: 1. Bladder tumor (1 cm)  Postoperative diagnosis:  1. Bladder tumor (1 cm)  Procedure:  1. Cystoscopy 2. Transurethral resection of bladder tumor (small) 3. Installation of intravesical gemcitabine  Surgeon: Ronda Fairly. Stoioff, M.D.  Anesthesia: General  Complications: None  Intraoperative findings:  1. Bladder tumor: Papillary 1 cm bladder tumor located antero-medial to the left ureteral orifice.  Superficial and high-grade in appearance grossly.  EBL: Minimal  Specimens: 1. Bladder tumor   Indication: Katelyn Kennedy is a patient who was found to have a bladder tumor. After reviewing the management options for treatment, she elected to proceed with the above surgical procedure(s). We have discussed the potential benefits and risks of the procedure, side effects of the proposed treatment, the likelihood of the patient achieving the goals of the procedure, and any potential problems that might occur during the procedure or recuperation. Informed consent has been obtained.  Description of procedure:  The patient was taken to the operating room and general anesthesia was induced.  The patient was placed in the dorsal lithotomy position, prepped and draped in the usual sterile fashion, and preoperative antibiotics were administered. A preoperative time-out was performed.   Cystourethroscopy was performed.  The patient's urethra was examined and was normal in appearance.   The bladder was then systematically examined in its entirety.  The bladder tumor was identified anterior and medial to the left ureteral orifice.   The bladder was then re-examined after the resectoscope was placed.  Using loop cautery resection, the entire tumor was resected, including superficial muscle and removed for permanent pathologic analysis.    Hemostasis was then achieved with the loop cautery and the bladder was emptied and reinspected with no further bleeding noted at the end of  the procedure.    A 16 French Foley catheter was placed.  The drainage bag tubing was clamped and 2000 mg of gemcitabine was instilled through the catheter via a closed system.  It will be kept indwelling for 1 hour and then drained along with catheter removal.   Scott C. Bernardo Heater,  MD

## 2017-05-04 NOTE — Transfer of Care (Signed)
Immediate Anesthesia Transfer of Care Note  Patient: Katelyn Kennedy  Procedure(s) Performed: TRANSURETHRAL RESECTION OF BLADDER TUMOR WITH gemcitabine (N/A Bladder)  Patient Location: PACU  Anesthesia Type:General  Level of Consciousness: awake, alert  and oriented  Airway & Oxygen Therapy: Patient Spontanous Breathing and Patient connected to face mask oxygen  Post-op Assessment: Report given to RN and Post -op Vital signs reviewed and stable  Post vital signs: Reviewed  Last Vitals:  Vitals:   05/04/17 0826 05/04/17 1131  BP: (!) 145/62 105/89  Pulse: 77 95  Resp: 16 16  Temp: (!) 36.1 C 37 C  SpO2: 98% 99%    Last Pain:  Vitals:   05/04/17 0826  TempSrc: Tympanic         Complications: No apparent anesthesia complications

## 2017-05-04 NOTE — OR Nursing (Signed)
Called and left message that her appointment is 05/19/17 @ 11:00.

## 2017-05-04 NOTE — OR Nursing (Signed)
Foley catheter discontinued at 1235.  153ml. Clear yellow urine noted in bag.  Foley catheter discarded per protocol.

## 2017-05-04 NOTE — Anesthesia Postprocedure Evaluation (Signed)
Anesthesia Post Note  Patient: Katelyn Kennedy  Procedure(s) Performed: TRANSURETHRAL RESECTION OF BLADDER TUMOR WITH gemcitabine (N/A Bladder)  Patient location during evaluation: PACU Anesthesia Type: General Level of consciousness: awake and alert Pain management: pain level controlled Vital Signs Assessment: post-procedure vital signs reviewed and stable Respiratory status: spontaneous breathing and respiratory function stable Cardiovascular status: stable Anesthetic complications: no     Last Vitals:  Vitals:   05/04/17 0826 05/04/17 1131  BP: (!) 145/62 105/89  Pulse: 77 95  Resp: 16 16  Temp: (!) 36.1 C 37 C  SpO2: 98% 99%    Last Pain:  Vitals:   05/04/17 0826  TempSrc: Tympanic                 Mical Brun K

## 2017-05-05 ENCOUNTER — Encounter: Payer: Self-pay | Admitting: Urology

## 2017-05-05 LAB — SURGICAL PATHOLOGY

## 2017-05-19 ENCOUNTER — Ambulatory Visit (INDEPENDENT_AMBULATORY_CARE_PROVIDER_SITE_OTHER): Payer: PPO | Admitting: Urology

## 2017-05-19 ENCOUNTER — Encounter: Payer: Self-pay | Admitting: Urology

## 2017-05-19 DIAGNOSIS — R918 Other nonspecific abnormal finding of lung field: Secondary | ICD-10-CM

## 2017-05-19 NOTE — Progress Notes (Signed)
05/19/2017 1:09 PM   [name] 01-08-1946 [id number]  Referring provider: [name], MD [address] [place], [place] [address]  Chief Complaint  Patient presents with  . Routine Post Op    HPI: 72 year old female presents for postop follow-up.  She was discovered to have a 1 cm papillary bladder tumor after an episode of gross hematuria.  She is status post TURBT on 05/04/2017.  She received intravesical gemcitabine post resection.  She had no postoperative problems and has no complaints today.  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  Muscle was present and not involved.   PMH: Past Medical History:  Diagnosis Date  . Allergic rhinitis   . Arthritis   . Diabetes mellitus without complication (HCC)    diet control/no meds per pt  . Dyspnea   . History of chicken pox   . HTN (hypertension)   . Smoker     Surgical History: Past Surgical History:  Procedure Laterality Date  . ANKLE FRACTURE SURGERY Left 10 years ago   "hard to wake up from anesthesia" per pt  . [place]  . BREAST BIOPSY Right 1978   BENIGN CYST  . BREAST EXCISIONAL BIOPSY Left 2011   Benign  . BREAST EXCISIONAL BIOPSY Right 1985   Benign   . CATARACT EXTRACTION    . CATARACT EXTRACTION W/ INTRAOCULAR LENS IMPLANT Bilateral   . COLONOSCOPY  2011  . POLYPECTOMY  2011  . 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 TUBAL LIGATION      Home Medications:  Allergies as of 05/19/2017      Reactions   Codeine Nausea Only      Medication List        Accurate as of 05/19/17  1:09 PM. Always use your most recent med list.          acetaminophen 500 MG tablet Commonly known as:  TYLENOL Take 500 mg by mouth daily as  needed for moderate pain or headache.   atorvastatin 20 MG tablet Commonly known as:  LIPITOR TAKE 1 TABLET BY MOUTH DAILY   Calcium + D 600-200 MG-UNIT per tablet Take 1 tablet by mouth daily.   fluticasone 50 MCG/ACT nasal spray Commonly known as:  FLONASE PLACE 2 SPRAYS INTO THE NOSE DAILY.   lisinopril 5 MG tablet Commonly known as:  PRINIVIL,ZESTRIL Take 0.5 tablets (2.5 mg total) by mouth daily.   multivitamin capsule Take 1 capsule by mouth daily.   traMADol 50 MG tablet Commonly known as:  ULTRAM Take 1 tablet (50 mg total) by mouth every 6 (six) hours as needed for moderate pain.   VISINE OP Place 1 drop into both eyes daily as needed (dry eyes).       Allergies:  Allergies  Allergen Reactions  . Codeine Nausea Only    Family History: Family History  Problem Relation Age of Onset  . Diabetes Mother   . Cancer Sister 24       BREAST  . Breast cancer Sister   . Cancer Maternal Grandmother        COLON  . Colon cancer Maternal Grandmother 86  . Stomach cancer Neg Hx     Social History:  reports that she has been smoking cigarettes.  She has a 20.00 pack-year smoking history. she has never used smokeless tobacco. She reports that she drinks alcohol. She reports that she does not use drugs.  ROS: UROLOGY Frequent Urination?: Yes Hard to postpone urination?: No Burning/pain with urination?: No Get up at night to urinate?: Yes Leakage of urine?: No Urine stream starts and stops?: No Trouble starting stream?: No Do you have to strain to urinate?: No Blood in urine?: No Urinary tract infection?: No Sexually transmitted disease?: No Injury to kidneys or bladder?: No Painful intercourse?: No Weak stream?: No Currently pregnant?: No Vaginal bleeding?: No Last menstrual period?: n  Gastrointestinal Nausea?: No Vomiting?: No Indigestion/heartburn?: No Diarrhea?: No Constipation?: No  Constitutional Fever: No Night sweats?: No Weight loss?:  No Fatigue?: No  Skin Skin rash/lesions?: No Itching?: No  Eyes Blurred vision?: No Double vision?: No  Ears/Nose/Throat Sore throat?: No Sinus problems?: No  Hematologic/Lymphatic Swollen glands?: No Easy bruising?: No  Cardiovascular Leg swelling?: No Chest pain?: No  Respiratory Cough?: No Shortness of breath?: No  Endocrine Excessive thirst?: No  Musculoskeletal Back pain?: No Joint pain?: No  Neurological Headaches?: No Dizziness?: No  Psychologic Depression?: No Anxiety?: No  Physical Exam: BP 123/69 (BP Location: Right Arm, Patient Position: Sitting, Cuff Size: Normal)   Pulse 76   Ht 4\' 11"  (1.499 m)   Wt 127 lb 14.4 oz (58 kg)   BMI 25.83 kg/m   Constitutional:  Alert and oriented, No acute distress. HEENT: [employer] AT, moist mucus membranes.  Trachea midline, no masses. Cardiovascular: No clubbing, cyanosis, or edema. Respiratory: Normal respiratory effort, no increased work of breathing. Neurologic: Grossly intact, no focal deficits, moving all 4 extremities. Psychiatric: Normal mood and affect.  Laboratory Data: Lab Results  Component Value Date   WBC 9.8 03/16/2017   HGB 15.0 03/16/2017   HCT 44.1 03/16/2017   MCV 97.8 03/16/2017   PLT 297.0 03/16/2017    Lab Results  Component Value Date   CREATININE 0.73 03/16/2017    Pertinent Imaging:  Results for orders placed during the hospital encounter of 04/22/17  CT HEMATURIA WORKUP   Narrative CLINICAL DATA:  Gross hematuria  EXAM: CT ABDOMEN AND PELVIS WITHOUT AND WITH CONTRAST  TECHNIQUE: Multidetector CT imaging of the abdomen and pelvis was performed following the standard protocol before and following the bolus administration of intravenous contrast.  CONTRAST:  182mL ISOVUE-300 IOPAMIDOL (ISOVUE-300) INJECTION 61%  COMPARISON:  None.  FINDINGS: Lower chest: 1.8 x 3.4 x 2.0 cm soft tissue lesion is identified in the the right lower lobe (image 11 series 4). 8 mm  posterior right lower lobe pulmonary nodule is seen on image 26.  Hepatobiliary: 5 mm hypoattenuating lesion posterior right liver is too small to characterize but likely benign. 11 mm low-density lesion in the lateral segment of the left liver is likely a cyst. There is no evidence for gallstones, gallbladder wall thickening, or pericholecystic fluid. No intrahepatic or extrahepatic biliary dilation.  Pancreas: No focal mass lesion. No dilatation of the main duct. No intraparenchymal cyst. No peripancreatic edema.  Spleen: No splenomegaly. No focal mass lesion.  Adrenals/Urinary Tract: No adrenal nodule or mass.  Precontrast imaging shows no stones in either kidney or ureter. No bladder stones.  Imaging after IV contrast administration shows no suspicious enhancing lesion in either kidney. There is no wall thickening or soft tissue filling defect in either intrarenal collecting system or renal pelvis. Ureters are well opacified and normal in appearance.  10 mm polypoid lesion  posterior left bladder wall is visible on all 3 sequences, but is best demonstrated on axial image 71 of series 13.  Stomach/Bowel: Tiny hiatal hernia. Stomach otherwise unremarkable. Duodenum is normally positioned as is the ligament of Treitz. No small bowel wall thickening. No small bowel dilatation. The terminal ileum is normal. The appendix is normal. Diverticular changes are noted in the left colon without evidence of diverticulitis.  Vascular/Lymphatic: There is abdominal aortic atherosclerosis without aneurysm. There is no gastrohepatic or hepatoduodenal ligament lymphadenopathy. No intraperitoneal or retroperitoneal lymphadenopathy. No pelvic sidewall lymphadenopathy.  Reproductive: The uterus has normal CT imaging appearance. There is no adnexal mass.  Other: No intraperitoneal free fluid.  Musculoskeletal: Bone windows reveal no worrisome lytic or sclerotic osseous  lesions.  IMPRESSION: 1. 10 mm polypoid lesion posterior left bladder wall, highly suspicious for urothelial neoplasm. 2. 3.4 cm soft tissue lesion in the inferior right lower lobe. Primary bronchogenic neoplasm a concern. There is a small associated nodule in the posterior right lower lobe. Dedicated CT chest with contrast recommended to further evaluate. 3.  Aortic Atherosclerois (ICD10-170.0)  These results will be called to the ordering clinician or representative by the Radiologist Assistant, and communication documented in the PACS or zVision Dashboard.   Electronically Signed   By: Misty Stanley M.D.   On: 04/22/2017 14:09    No results found for this or any previous visit.  Assessment & Plan:    1. Ta urothelial carcinoma the bladder, low-grade The pathology report was discussed with Ms. Jerline Pain.  The tumor was completely resected and she received post resection intravesical chemotherapy.  I discussed the need for surveillance to monitor for recurrence.  Will schedule a follow-up surveillance cystoscopy in 3 months.   2. Lung mass On her CT urogram she was incidentally discovered to have a 3.4 cm soft tissue lesion in the inferior right lower lobe which was concerning for a primary bronchogenic neoplasm.  A CT of the chest with contrast was recommended.  This was ordered in addition to a referral to oncology. - CT CHEST W CONTRAST; Future - Ambulatory referral to Oncology   Return in about 3 months (around 08/16/2017) for Cystoscopy.  Abbie Sons, [place] [address], [place], [place] [address] [phone]

## 2017-05-20 ENCOUNTER — Encounter: Payer: Self-pay | Admitting: *Deleted

## 2017-05-20 DIAGNOSIS — R918 Other nonspecific abnormal finding of lung field: Secondary | ICD-10-CM

## 2017-05-20 NOTE — Progress Notes (Signed)
  Oncology Nurse Navigator Documentation  Navigator Location: [facility] (05/20/17 1400) Referral date to RadOnc/MedOnc: 05/20/17 (05/20/17 1400) )Navigator Encounter Type: Introductory phone call (05/20/17 1400)   Abnormal Finding Date: 04/22/17 (05/20/17 1400)                     Barriers/Navigation Needs: Coordination of Care (05/20/17 1400)   Interventions: Coordination of Care (05/20/17 1400)   Coordination of Care: Appts;Radiology (05/20/17 1400)        Acuity: Level 2 (05/20/17 1400)   Acuity Level 2: Initial guidance, education and coordination as needed;Educational needs;Assistance expediting appointments (05/20/17 1400)  referral received from Dr. Bernardo Heater for patient to be evaluated for lung mass. Pt presented at tumor board on 05/20/2017 to discuss if needed dedicated chest CT or if could get PET scan instead. Radiologist approved for patient to go ahead with PET. Pt scheduled for PET on 2/13. Pt scheduled in [facility] on Fri 2/15 to see Dr. Ashby Dawes at 8:30am and Dr. Janese Banks at 9:00am. Pt notified of appts and informed that appts will be mailed as well. Reviewed with patient instructions for PET scan and what to expect at appt on 2/15 at the cancer center. All questions answered during phone call. Contact info given and instructed to call with any further questions or needs. Pt verbalized understanding. Nothing further needed at this time.    Time Spent with Patient: 60 (05/20/17 1400)

## 2017-05-24 ENCOUNTER — Ambulatory Visit: Payer: PPO | Admitting: Oncology

## 2017-05-26 ENCOUNTER — Ambulatory Visit
Admission: RE | Admit: 2017-05-26 | Discharge: 2017-05-26 | Disposition: A | Discharge status: Home / Self Care | Payer: PPO | Source: Ambulatory Visit | Attending: Oncology | Admitting: Oncology

## 2017-05-26 DIAGNOSIS — I2510 Atherosclerotic heart disease of native coronary artery without angina pectoris: Secondary | ICD-10-CM | POA: Insufficient documentation

## 2017-05-26 DIAGNOSIS — R918 Other nonspecific abnormal finding of lung field: Secondary | ICD-10-CM | POA: Insufficient documentation

## 2017-05-26 DIAGNOSIS — K449 Diaphragmatic hernia without obstruction or gangrene: Secondary | ICD-10-CM | POA: Diagnosis not present

## 2017-05-26 DIAGNOSIS — I700 Atherosclerosis of aorta: Secondary | ICD-10-CM | POA: Insufficient documentation

## 2017-05-26 DIAGNOSIS — R590 Localized enlarged lymph nodes: Secondary | ICD-10-CM | POA: Insufficient documentation

## 2017-05-26 LAB — GLUCOSE, CAPILLARY: Glucose-Capillary: 108 mg/dL — ABNORMAL HIGH (ref 65–99)

## 2017-05-26 MED ORDER — FLUDEOXYGLUCOSE F - 18 (FDG) INJECTION
12.3300 | Freq: Once | INTRAVENOUS | Status: AC | PRN
  Administered 2017-05-26: 12.33 via INTRAVENOUS

## 2017-05-27 ENCOUNTER — Inpatient Hospital Stay: Payer: PPO | Attending: Oncology

## 2017-05-27 DIAGNOSIS — R0602 Shortness of breath: Secondary | ICD-10-CM | POA: Insufficient documentation

## 2017-05-27 DIAGNOSIS — E7800 Pure hypercholesterolemia, unspecified: Secondary | ICD-10-CM | POA: Insufficient documentation

## 2017-05-27 DIAGNOSIS — E119 Type 2 diabetes mellitus without complications: Secondary | ICD-10-CM | POA: Insufficient documentation

## 2017-05-27 DIAGNOSIS — I10 Essential (primary) hypertension: Secondary | ICD-10-CM | POA: Insufficient documentation

## 2017-05-27 DIAGNOSIS — M810 Age-related osteoporosis without current pathological fracture: Secondary | ICD-10-CM | POA: Insufficient documentation

## 2017-05-27 DIAGNOSIS — R918 Other nonspecific abnormal finding of lung field: Secondary | ICD-10-CM | POA: Insufficient documentation

## 2017-05-27 DIAGNOSIS — E875 Hyperkalemia: Secondary | ICD-10-CM | POA: Insufficient documentation

## 2017-05-27 DIAGNOSIS — M129 Arthropathy, unspecified: Secondary | ICD-10-CM | POA: Insufficient documentation

## 2017-05-27 DIAGNOSIS — Z79899 Other long term (current) drug therapy: Secondary | ICD-10-CM | POA: Insufficient documentation

## 2017-05-27 DIAGNOSIS — R590 Localized enlarged lymph nodes: Secondary | ICD-10-CM | POA: Insufficient documentation

## 2017-05-27 DIAGNOSIS — Z803 Family history of malignant neoplasm of breast: Secondary | ICD-10-CM | POA: Insufficient documentation

## 2017-05-27 DIAGNOSIS — Z800 Family history of malignant neoplasm of digestive organs: Secondary | ICD-10-CM | POA: Insufficient documentation

## 2017-05-27 DIAGNOSIS — K449 Diaphragmatic hernia without obstruction or gangrene: Secondary | ICD-10-CM | POA: Insufficient documentation

## 2017-05-27 DIAGNOSIS — R310 Gross hematuria: Secondary | ICD-10-CM | POA: Insufficient documentation

## 2017-05-27 DIAGNOSIS — F17210 Nicotine dependence, cigarettes, uncomplicated: Secondary | ICD-10-CM | POA: Insufficient documentation

## 2017-05-28 ENCOUNTER — Encounter: Payer: Self-pay | Admitting: Oncology

## 2017-05-28 ENCOUNTER — Inpatient Hospital Stay (HOSPITAL_BASED_OUTPATIENT_CLINIC_OR_DEPARTMENT_OTHER): Payer: PPO | Admitting: Oncology

## 2017-05-28 ENCOUNTER — Other Ambulatory Visit: Payer: Self-pay | Admitting: *Deleted

## 2017-05-28 ENCOUNTER — Telehealth: Payer: Self-pay | Admitting: *Deleted

## 2017-05-28 ENCOUNTER — Ambulatory Visit (INDEPENDENT_AMBULATORY_CARE_PROVIDER_SITE_OTHER): Payer: PPO | Admitting: Internal Medicine

## 2017-05-28 ENCOUNTER — Encounter: Payer: Self-pay | Admitting: *Deleted

## 2017-05-28 ENCOUNTER — Encounter: Payer: Self-pay | Admitting: Internal Medicine

## 2017-05-28 VITALS — BP 120/64 | HR 78 | Temp 98.2°F | Resp 18 | Ht 59.0 in | Wt 128.7 lb

## 2017-05-28 DIAGNOSIS — M810 Age-related osteoporosis without current pathological fracture: Secondary | ICD-10-CM

## 2017-05-28 DIAGNOSIS — I10 Essential (primary) hypertension: Secondary | ICD-10-CM | POA: Diagnosis not present

## 2017-05-28 DIAGNOSIS — Z800 Family history of malignant neoplasm of digestive organs: Secondary | ICD-10-CM

## 2017-05-28 DIAGNOSIS — M129 Arthropathy, unspecified: Secondary | ICD-10-CM

## 2017-05-28 DIAGNOSIS — E7800 Pure hypercholesterolemia, unspecified: Secondary | ICD-10-CM | POA: Diagnosis not present

## 2017-05-28 DIAGNOSIS — E875 Hyperkalemia: Secondary | ICD-10-CM | POA: Diagnosis not present

## 2017-05-28 DIAGNOSIS — R918 Other nonspecific abnormal finding of lung field: Secondary | ICD-10-CM

## 2017-05-28 DIAGNOSIS — K449 Diaphragmatic hernia without obstruction or gangrene: Secondary | ICD-10-CM

## 2017-05-28 DIAGNOSIS — E119 Type 2 diabetes mellitus without complications: Secondary | ICD-10-CM

## 2017-05-28 DIAGNOSIS — R0602 Shortness of breath: Secondary | ICD-10-CM

## 2017-05-28 DIAGNOSIS — R310 Gross hematuria: Secondary | ICD-10-CM | POA: Diagnosis not present

## 2017-05-28 DIAGNOSIS — R590 Localized enlarged lymph nodes: Secondary | ICD-10-CM

## 2017-05-28 DIAGNOSIS — F17210 Nicotine dependence, cigarettes, uncomplicated: Secondary | ICD-10-CM

## 2017-05-28 DIAGNOSIS — Z79899 Other long term (current) drug therapy: Secondary | ICD-10-CM

## 2017-05-28 DIAGNOSIS — Z803 Family history of malignant neoplasm of breast: Secondary | ICD-10-CM | POA: Diagnosis not present

## 2017-05-28 MED ORDER — ALPRAZOLAM 0.5 MG PO TABS: 0.5000 mg | ORAL_TABLET | Freq: Once | ORAL | 0 refills | Status: AC

## 2017-05-28 NOTE — Telephone Encounter (Signed)
Oncology Nurse Navigator Documentation  Oncology Nurse Navigator Flowsheets 05/28/2017  Navigator Location [facility]  Navigator Encounter Type Telephone/Ms. Maj called me back. I gave her an appt to be seen on 06/03/17 but she is unsure of when her biopsy is.  I gave her another appt for 06/09/17.  She verbalized understanding of appt time and place.   Telephone Incoming Call  Treatment Phase Abnormal Scans  Barriers/Navigation Needs Coordination of Care  Interventions Coordination of Care  Coordination of Care Appts  Acuity Level 1  Time Spent with Patient 30

## 2017-05-28 NOTE — Telephone Encounter (Signed)
Oncology Nurse Navigator Documentation  Oncology Nurse Navigator Flowsheets 05/28/2017  Navigator Location [facility]  Navigator Encounter Type Telephone/I received a message from [facility] that patient would like tx here in [place]. I called patient to schedule. I was unable to reach but did leave vm message with my name and phone number to call.   Telephone Outgoing Call  Treatment Phase Abnormal Scans  Barriers/Navigation Needs Coordination of Care  Interventions Coordination of Care  Coordination of Care Other  Acuity Level 2  Time Spent with Patient 15

## 2017-05-28 NOTE — Progress Notes (Signed)
Hematology/Oncology Consult note [facility] Telephone:([phone] [phone]  Patient Care Team: [name], MD as PCP - General Telford Nab, RN as Registered Nurse   Name of the patient: Katelyn Kennedy  [id number]  22-Mar-1946    Reason for referral- lung mass   Referring physician- Dr. Bernardo Heater  Date of visit: 05/28/17   History of presenting illness-patient is a 72 year old female with a past medical history significant for diet-controlled diabetes, hypertension and hypercholesterolemia among other medical problems.  She did smoke half a pack of cigarettes per day for about 40 years and quit smoking about a month ago.  She was seen by Dr.Stoioff of her symptoms of hematuria.  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  An incidental finding of a 3.4 cm mass in her right lower lobe  This was followed by a PET/CT scan on 05/26/2017 which showed an irregular hypermetabolic 3.4 x 1.9 cm right lower lobe mass with an SUV of 14.8.  Also noted was a basilar right lower lobe 7 mm pulmonary nodule with additional small scattered tiny pulmonary nodules in the right upper lobe below PET resolution.  No significant pulmonary nodules in the left lung.Mildly enlarged hypermetabolic 1 cm right subcarinal node with an SUV of 8.8.  No additional hypermetabolic hilar or mediastinal nodes.  No hypermetabolic axillary adenopathy or evidence of distant metastatic disease  Patient has been seen by pulmonary today and will be undergoing bronchoscopy of the right lower lobe mass next week  Patient lives alone and her children live out of state.  She is independent of her ADLs and IADLs.  She lives in [place]  and would like to get her subsequent care there.  She denies any changes in her appetite, unintentional weight loss, shortness of breath or aches or pains anywhere.  Patient's hematuria has resolved since cystoscopy and TURBT  ECOG PS- 0  Pain scale- 0   Review of systems- Review of Systems  Constitutional: Negative for chills, fever, malaise/fatigue and weight loss.  HENT: Negative for congestion, ear discharge and nosebleeds.   Eyes: Negative for blurred vision.  Respiratory: Negative for cough, hemoptysis, sputum production, shortness of breath and wheezing.   Cardiovascular: Negative for chest pain, palpitations, orthopnea and claudication.  Gastrointestinal: Negative for abdominal pain, blood in stool, constipation, diarrhea, heartburn, melena, nausea and vomiting.  Genitourinary: Negative for dysuria, flank pain, frequency, hematuria and urgency.  Musculoskeletal: Negative for back pain, joint pain and myalgias.  Skin: Negative for rash.  Neurological: Negative for dizziness, tingling, focal weakness, seizures, weakness and headaches.  Endo/Heme/Allergies: Does not bruise/bleed easily.  Psychiatric/Behavioral: Negative for depression and suicidal ideas. The patient does not have insomnia.     Allergies  Allergen Reactions  . Codeine Nausea Only    Patient Active Problem List   Diagnosis Date Noted  . Bladder tumor 04/29/2017  . Lung mass 04/29/2017  . Gross hematuria 03/16/2017  . Hyperpotassemia 02/11/2017  . Counseling regarding end of life decision making 07/31/2014  . Eczema 07/20/2011  . MICROALBUMINURIA 03/25/2010  . Osteoporosis 10/08/2009  . Diabetes mellitus with no complication ([place]) 94/49/6759  . HYPERCHOLESTEROLEMIA 09/03/2009  . TOBACCO ABUSE for 40 pack years 09/03/2009  . Allergic rhinitis 09/03/2009  . ARTHRITIS  09/03/2009  . CHICKENPOX, HX OF 09/03/2009     Past Medical History:  Diagnosis Date  . Allergic rhinitis   . Arthritis   . Diabetes  mellitus without complication (HCC)    diet control/no meds per pt  . Dyspnea   . History of chicken pox   . HTN (hypertension)   . Smoker      Past Surgical History:  Procedure Laterality Date  . ANKLE FRACTURE SURGERY Left 10 years ago   "hard to wake up from anesthesia" per pt  . [place]  . BREAST BIOPSY Right 1978   BENIGN CYST  . BREAST EXCISIONAL BIOPSY Left 2011   Benign  . BREAST EXCISIONAL BIOPSY Right 1985   Benign   . CATARACT EXTRACTION    . CATARACT EXTRACTION W/ INTRAOCULAR LENS IMPLANT Bilateral   . COLONOSCOPY  2011  . POLYPECTOMY  2011  . 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 TUBAL LIGATION      Social History   Socioeconomic History  . Marital status: Divorced    Spouse name: Not on file  . Number of children: Not on file  . Years of education: Not on file  . Highest education level: Not on file  Social Needs  . Financial resource strain: Not on file  . Food insecurity - worry: Not on file  . Food insecurity - inability: Not on file  . Transportation needs - medical: Not on file  . Transportation needs - non-medical: Not on file  Occupational History  . Not on file  Tobacco Use  . Smoking status: Current Every Day Smoker    Packs/day: 0.50    Years: 40.00    Pack years: 20.00    Types: Cigarettes  . Smokeless tobacco: Never Used  Substance and Sexual Activity  . Alcohol use: Yes    Comment: occassionally  . Drug use: No  . Sexual activity: Not on file  Other Topics Concern  . Not on file  Social History Narrative  . Not on file     Family History  Problem Relation Age of Onset  . Diabetes Mother   . Cancer Sister 87       BREAST  . Breast cancer Sister   . Cancer Maternal Grandmother        COLON  . Colon cancer  Maternal Grandmother 86  . Stomach cancer Neg Hx      Current Outpatient Medications:  .  atorvastatin (LIPITOR) 20 MG tablet, TAKE 1 TABLET BY MOUTH DAILY, Disp: 90 tablet, Rfl: 3 .  Calcium Carbonate-Vitamin D (CALCIUM + D) 600-200 MG-UNIT per tablet, Take 1 tablet by mouth daily. , Disp: , Rfl:  .  fluticasone (FLONASE) 50 MCG/ACT nasal spray, PLACE 2 SPRAYS INTO THE NOSE DAILY. (Patient taking differently: PLACE 2 SPRAYS INTO THE NOSE ONCE DAILY AS NEEDED FOR ALLERGIES), Disp: 16 g, Rfl: 5 .  lisinopril (PRINIVIL,ZESTRIL) 5 MG tablet, Take 0.5 tablets (2.5 mg total) by mouth daily., Disp: 45 tablet, Rfl: 3 .  Multiple Vitamin (MULTIVITAMIN) capsule, Take 1 capsule by mouth daily.  , Disp: , Rfl:  .  Tetrahydrozoline HCl (VISINE OP), Place 1 drop into both eyes daily as needed (dry eyes)., Disp: , Rfl:  .  acetaminophen (TYLENOL) 500 MG tablet, Take 500 mg  by mouth daily as needed for moderate pain or headache., Disp: , Rfl:  .  traMADol (ULTRAM) 50 MG tablet, Take 1 tablet (50 mg total) by mouth every 6 (six) hours as needed for moderate pain. (Patient not taking: Reported on 05/28/2017), Disp: 15 tablet, Rfl: 0   Physical exam:  Vitals:   05/28/17 0828  BP: 120/64  Pulse: 78  Resp: 18  Temp: 98.2 F (36.8 C)  TempSrc: Tympanic  SpO2: 97%  Weight: 128 lb 11.2 oz (58.4 kg)  Height: 4\' 11"  (1.499 m)   Physical Exam  Constitutional: She is oriented to person, place, and time and well-developed, well-nourished, and in no distress.  HENT:  Head: Normocephalic and atraumatic.  Eyes: EOM are normal. Pupils are equal, round, and reactive to light.  Neck: Normal range of motion.  Cardiovascular: Normal rate, regular rhythm and normal heart sounds.  Pulmonary/Chest: Effort normal and breath sounds normal.  Abdominal: Soft. Bowel sounds are normal.  Neurological: She is alert and oriented to person, place, and time.  Skin: Skin is warm and dry.       CMP Latest Ref Rng & Units  03/16/2017  Glucose 70 - 99 mg/dL 101(H)  BUN 6 - 23 mg/dL 18  Creatinine 0.40 - 1.20 mg/dL 0.73  Sodium 135 - 145 mEq/L 137  Potassium 3.5 - 5.1 mEq/L 4.4  Chloride 96 - 112 mEq/L 101  CO2 19 - 32 mEq/L 27  Calcium 8.4 - 10.5 mg/dL 10.4  Total Protein 6.0 - 8.3 g/dL -  Total Bilirubin 0.2 - 1.2 mg/dL -  Alkaline Phos 39 - 117 U/L -  AST 0 - 37 U/L -  ALT 0 - 35 U/L -   CBC Latest Ref Rng & Units 03/16/2017  WBC 4.0 - 10.5 K/uL 9.8  Hemoglobin 12.0 - 15.0 g/dL 15.0  Hematocrit 36.0 - 46.0 % 44.1  Platelets 150.0 - 400.0 K/uL 297.0    No images are attached to the encounter.  Nm Pet Image Initial (pi) Skull Base To Thigh  Result Date: 05/26/2017 CLINICAL DATA:  Initial treatment strategy for right lower lobe lung mass and pulmonary nodule detected on recent hematuria protocol CT study. Recent diagnosis bladder cancer status post TURBT 05/04/2017. EXAM: NUCLEAR MEDICINE PET SKULL BASE TO THIGH TECHNIQUE: 12.3 mCi F-18 FDG was injected intravenously. Full-ring PET imaging was performed from the skull base to thigh after the radiotracer. CT data was obtained and used for attenuation correction and anatomic localization. Mediastinal Blood Pool Activity (max SUV): 2.5 FASTING BLOOD GLUCOSE:  Value: 108 mg/dl COMPARISON:  04/22/2017 CT abdomen/pelvis. FINDINGS: NECK: No hypermetabolic lymph nodes in the neck. Partially calcified subcentimeter bilateral thyroid nodules, non hypermetabolic. CHEST: Irregular hypermetabolic 3.4 x 1.9 cm right lower lobe lung mass (series 3/image 115) with max SUV 14.8, not appreciably changed in size since 04/22/2017 chest CT. Basilar right lower lobe 7 mm solid pulmonary nodule (series 3/image 128) with additional smaller scattered tiny pulmonary nodules in the right upper lobe, below PET resolution. No significant pulmonary nodules in the left lung. No acute consolidative airspace disease. No pneumothorax or pleural effusions. Mildly enlarged hypermetabolic 1.0 cm  right subcarinal node with max SUV 8.8 (series 3/image 95). No hypermetabolic hilar nodes. No additional hypermetabolic mediastinal nodes. No hypermetabolic axillary nodes. Coronary atherosclerosis. Atherosclerotic nonaneurysmal thoracic aorta. ABDOMEN/PELVIS: No abnormal hypermetabolic activity within the liver, pancreas, adrenal glands, or spleen. No hypermetabolic lymph nodes in the abdomen or pelvis. Small hiatal hernia. Scattered moderate colonic diverticulosis, most  prominent in the sigmoid colon. Atherosclerotic nonaneurysmal abdominal aorta. Bladder is collapsed and not well evaluated. SKELETON: No focal hypermetabolic activity to suggest skeletal metastasis. Marked degenerative changes in the lumbar spine with associated mild FDG uptake. IMPRESSION: 1. Irregular hypermetabolic 3.4 cm right lower lobe lung mass. Primary bronchogenic carcinoma is favored. 2. Hypermetabolic subcarinal adenopathy compatible with nodal metastasis. 3. Few solid pulmonary nodules in the right lung, largest 7 mm in the right lower lobe, below PET resolution, cannot exclude ipsilateral pulmonary metastases. Recommend attention on follow-up chest CT in 3 months. 4. No evidence of hypermetabolic metastatic disease in the abdomen, pelvis or skeleton. 5. Chronic findings include: Aortic Atherosclerosis (ICD10-I70.0). Coronary atherosclerosis. Small hiatal hernia. Electronically Signed   By: Ilona Sorrel M.D.   On: 05/26/2017 10:06    Assessment and plan- Patient is a 71 y.o. female with new right lower lobe mass and subcarinal adenopathy noted on PET/CT scan  I have personally reviewed the PET/CT scan images independently and I discussed the findings with the patient.  Patient is noted to have a dominant right lower lobe lung mass of 3.4 cm.  She also has another 7 mm nodule in the right lower lobe and a possible subcentimeter nodule in the right upper lobe.  This along with the subcarinal adenopathy is highly concerning for  primary lung malignancy.  She will be getting bronchoscopy next week for definitive tissue diagnosis.  At this time we need to wait for final pathology before making treatment recommendations.  Broadly discussed with her the lung cancer can be small cell or non-small cell lung cancer.  Given that there is no evidence of distant metastatic disease, this can potentially be treated with a combination of chemotherapy and radiation.  Systemic chemotherapy options will depend on final pathology.  Patient would like to get her further treatments at [facility] and I will therefore make a referral to Dr. Julien Nordmann to take over her care at this time.  We will also discuss putting her case at the tumor board at [facility].  Overall she has a good performance status but given that she has some additional small pulmonary nodules in the right upper lobe as well as right lower lobe and N2 disease, I do not think that she would be a candidate for surgery despite a good performance status.  Based on pathology she would likely need concurrent chemoradiation which would be discussed in more detail by Dr. Julien Nordmann. I will also make referral to radiation oncology at [facility] at this time so that she can be seen by both of them in 2 weeks time after final pathology is back  I will arrange MRI brain with and without contrast to be done at [facility] to complete her staging workup  Non-muscle invasive superficial bladder cancer can be continued to be followed by urology.   Thank you for this kind referral and the opportunity to participate in the care of this  Patient   Visit Diagnosis 1. Lung mass   2. Mediastinal adenopathy     Dr. Randa Evens, MD, MPH [facility] at [facility] Pager- [phone] 05/28/2017  1:36 PM

## 2017-05-28 NOTE — Progress Notes (Signed)
No new changes noted today 

## 2017-05-28 NOTE — Progress Notes (Signed)
[facility] Consultation      Assessment and Plan:  Lung nodule with subcarinal lymphadenopathy, increased uptake on PET scan, appears suspicious for lung cancer. -We will plan for biopsy, which can be performed Via EBUS/navigational bronchoscopy.    Date: 05/28/2017  MRN# [id number] Katelyn Kennedy [birth year]  Referring Physician: Dr. Varney Biles Katelyn Kennedy is a 72 y.o. old female seen in consultation for chief complaint of: lung mass    HPI:   Patient has a history of a 1 cm papillary bladder tumor and is status post TURBT on 05/04/2017.  This showed noninvasive low-grade papillary urothelial carcinoma. She received intravesical gemcitabine post resection.    However on her CT urogram she was incidentally found to have a 3 cm right lower lobe lung nodule, which led to further workup, including a PET scan. Patient has a long history of smoking, she smoked up until about 4 weeks ago when the bladder cancer was discovered.  She denies any respiratory history, she does not have a history of COPD, asthma, previous admissions to the hospital for respiratory problems, she denies pneumonia.  She is not limited by breathing, she notes that if she exerts very hard that she might be short of breath.  However she does not participate in any exercise program and is not very active.  She is never had problems with anesthesia as far she can recall.  She does not live with a smoker.  She denies significant GERD or sinus drainage problems.    I personally reviewed images from 05/26/17, PET scan, there is subcarinal lymphadenopathy with increased uptake, this is accessible from the right mainstem, there is a right lower lobe lesion, distal and medial to the takeoff of the superior segment bronchus.   PMHX:   Past Medical History:  Diagnosis Date  . Allergic rhinitis   . Arthritis   . Diabetes mellitus without complication (HCC)    diet control/no meds per pt  . Dyspnea   . History  of chicken pox   . HTN (hypertension)   . Smoker    Surgical Hx:  Past Surgical History:  Procedure Laterality Date  . ANKLE FRACTURE SURGERY Left 10 years ago   "hard to wake up from anesthesia" per pt  . [place]  . BREAST BIOPSY Right 1978   BENIGN CYST  . BREAST EXCISIONAL BIOPSY Left 2011   Benign  . BREAST EXCISIONAL BIOPSY Right 1985   Benign   . CATARACT EXTRACTION    . CATARACT EXTRACTION W/ INTRAOCULAR LENS IMPLANT Bilateral   . COLONOSCOPY  2011  . POLYPECTOMY  2011  . 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 TUBAL LIGATION     Family Hx:  Family History  Problem Relation Age of Onset  . Diabetes Mother   . Cancer Sister 45       BREAST  . Breast cancer Sister   . Cancer Maternal Grandmother        COLON  . Colon cancer Maternal Grandmother 86  . Stomach cancer Neg Hx    Social Hx:   Social History   Tobacco Use  . Smoking status: Quit 4 weeks ago.     Packs/day: 0.50    Years: 40.00    Pack years: 20.00  Types: Cigarettes  . Smokeless tobacco: Never Used  Substance Use Topics  . Alcohol use: Yes    Comment: occassionally  . Drug use: No   Medication:    Current Outpatient Medications:  .  acetaminophen (TYLENOL) 500 MG tablet, Take 500 mg by mouth daily as needed for moderate pain or headache., Disp: , Rfl:  .  atorvastatin (LIPITOR) 20 MG tablet, TAKE 1 TABLET BY MOUTH DAILY, Disp: 90 tablet, Rfl: 3 .  Calcium Carbonate-Vitamin D (CALCIUM + D) 600-200 MG-UNIT per tablet, Take 1 tablet by mouth daily. , Disp: , Rfl:  .  fluticasone (FLONASE) 50 MCG/ACT nasal spray, PLACE 2 SPRAYS INTO THE NOSE DAILY. (Patient taking differently: PLACE 2 SPRAYS INTO THE NOSE ONCE DAILY AS NEEDED FOR ALLERGIES), Disp: 16 g, Rfl: 5 .  lisinopril  (PRINIVIL,ZESTRIL) 5 MG tablet, Take 0.5 tablets (2.5 mg total) by mouth daily., Disp: 45 tablet, Rfl: 3 .  Multiple Vitamin (MULTIVITAMIN) capsule, Take 1 capsule by mouth daily.  , Disp: , Rfl:  .  Tetrahydrozoline HCl (VISINE OP), Place 1 drop into both eyes daily as needed (dry eyes)., Disp: , Rfl:  .  traMADol (ULTRAM) 50 MG tablet, Take 1 tablet (50 mg total) by mouth every 6 (six) hours as needed for moderate pain. (Patient not taking: Reported on 05/28/2017), Disp: 15 tablet, Rfl: 0   Allergies:  Codeine  Review of Systems: Gen:  Denies  fever, sweats, chills HEENT: Denies blurred vision, double vision. bleeds, sore throat Cvc:  No dizziness, chest pain. Resp:   Denies cough or sputum production, shortness of breath Gi: Denies swallowing difficulty, stomach pain. Gu:  Denies bladder incontinence, burning urine Ext:   No Joint pain, stiffness. Skin: No skin rash,  hives  Endoc:  No polyuria, polydipsia. Psych: No depression, insomnia. Other:  All other systems were reviewed with the patient and were negative other that what is mentioned in the HPI.   Physical Examination:   VS: --reviewed.  General Appearance: No distress  Neuro:without focal findings,  speech normal,  HEENT: PERRLA, EOM intact.   Pulmonary: normal breath sounds, No wheezing.  CardiovascularNormal S1,S2.  No m/r/g.   Abdomen: Benign, Soft, non-tender. Renal:  No costovertebral tenderness  GU:  No performed at this time. Endoc: No evident thyromegaly, no signs of acromegaly. Skin:   warm, no rashes, no ecchymosis  Extremities: normal, no cyanosis, clubbing.  Other findings:    LABORATORY PANEL:   CBC No results for input(s): WBC, HGB, HCT, PLT in the last 168 hours. ------------------------------------------------------------------------------------------------------------------  Chemistries  No results for input(s): NA, K, CL, CO2, GLUCOSE, BUN, CREATININE, CALCIUM, MG, AST, ALT, ALKPHOS, BILITOT  in the last 168 hours.  Invalid input(s): GFRCGP ------------------------------------------------------------------------------------------------------------------  Cardiac Enzymes No results for input(s): TROPONINI in the last 168 hours. ------------------------------------------------------------  RADIOLOGY:  Nm Pet Image Initial (pi) Skull Base To Thigh  Result Date: 05/26/2017 CLINICAL DATA:  Initial treatment strategy for right lower lobe lung mass and pulmonary nodule detected on recent hematuria protocol CT study. Recent diagnosis bladder cancer status post TURBT 05/04/2017. EXAM: NUCLEAR MEDICINE PET SKULL BASE TO THIGH TECHNIQUE: 12.3 mCi F-18 FDG was injected intravenously. Full-ring PET imaging was performed from the skull base to thigh after the radiotracer. CT data was obtained and used for attenuation correction and anatomic localization. Mediastinal Blood Pool Activity (max SUV): 2.5 FASTING BLOOD GLUCOSE:  Value: 108 mg/dl COMPARISON:  04/22/2017 CT abdomen/pelvis. FINDINGS: NECK: No hypermetabolic lymph nodes in the neck. Partially calcified  subcentimeter bilateral thyroid nodules, non hypermetabolic. CHEST: Irregular hypermetabolic 3.4 x 1.9 cm right lower lobe lung mass (series 3/image 115) with max SUV 14.8, not appreciably changed in size since 04/22/2017 chest CT. Basilar right lower lobe 7 mm solid pulmonary nodule (series 3/image 128) with additional smaller scattered tiny pulmonary nodules in the right upper lobe, below PET resolution. No significant pulmonary nodules in the left lung. No acute consolidative airspace disease. No pneumothorax or pleural effusions. Mildly enlarged hypermetabolic 1.0 cm right subcarinal node with max SUV 8.8 (series 3/image 95). No hypermetabolic hilar nodes. No additional hypermetabolic mediastinal nodes. No hypermetabolic axillary nodes. Coronary atherosclerosis. Atherosclerotic nonaneurysmal thoracic aorta. ABDOMEN/PELVIS: No abnormal  hypermetabolic activity within the liver, pancreas, adrenal glands, or spleen. No hypermetabolic lymph nodes in the abdomen or pelvis. Small hiatal hernia. Scattered moderate colonic diverticulosis, most prominent in the sigmoid colon. Atherosclerotic nonaneurysmal abdominal aorta. Bladder is collapsed and not well evaluated. SKELETON: No focal hypermetabolic activity to suggest skeletal metastasis. Marked degenerative changes in the lumbar spine with associated mild FDG uptake. IMPRESSION: 1. Irregular hypermetabolic 3.4 cm right lower lobe lung mass. Primary bronchogenic carcinoma is favored. 2. Hypermetabolic subcarinal adenopathy compatible with nodal metastasis. 3. Few solid pulmonary nodules in the right lung, largest 7 mm in the right lower lobe, below PET resolution, cannot exclude ipsilateral pulmonary metastases. Recommend attention on follow-up chest CT in 3 months. 4. No evidence of hypermetabolic metastatic disease in the abdomen, pelvis or skeleton. 5. Chronic findings include: Aortic Atherosclerosis (ICD10-I70.0). Coronary atherosclerosis. Small hiatal hernia. Electronically Signed   By: Ilona Sorrel M.D.   On: 05/26/2017 10:06       Thank  you for the consultation and for allowing [facility] to assist in the care of your patient. Our recommendations are noted above.  Please contact us if we can be of further service.   Marda Stalker, MD.  Board Certified in Internal Medicine, Pulmonary Medicine, [place], and Sleep Medicine.  [facility] Office Number: [phone]  Patricia Pesa, M.D.  Merton Border, M.D  05/28/2017

## 2017-05-28 NOTE — Patient Instructions (Signed)
Will send for lung biopsy via bronchoscopy.

## 2017-05-28 NOTE — Progress Notes (Signed)
Met with patient at medical oncology/ pulmonary consults. Reviewed plan of care to include Ebus biopsy next week with follow up care provided in [place]. Discussed likely course of treatment in detail and answered questions as appropriate. Will make referral to Dr. Julien Nordmann and thoracic nurse navigation in [place]. Provided support and patient is aware that she can contact me at any time with questions or concerns.

## 2017-05-28 NOTE — Progress Notes (Signed)
Super D CT w/o contrast ordered for ENB

## 2017-05-28 NOTE — Progress Notes (Signed)
error 

## 2017-05-31 ENCOUNTER — Telehealth: Payer: Self-pay | Admitting: *Deleted

## 2017-05-31 NOTE — Telephone Encounter (Signed)
Pt informed of date and time of PAT and ENB.    Provider: Mortimer Fries Date: 06/08/17 Procedure: ENB DX: Lung Mass

## 2017-05-31 NOTE — Telephone Encounter (Signed)
Please call pt to discuss CT of chest.

## 2017-05-31 NOTE — Telephone Encounter (Signed)
Spoke with and informed her again that she has to be at registration at 11am for CT scan and then she will be taken upstairs to day surgery to get prepped for ENB. Pt verbalized understanding.

## 2017-06-01 NOTE — Telephone Encounter (Signed)
ENB approved by [employer] Authorization # [id number] Valid from 05/31/17 to 08/29/17. Katelyn Kennedy

## 2017-06-01 NOTE — Telephone Encounter (Signed)
Spoke with Melvenia Beam at the scheduling and she has pt scheduled for Super D CT at 11am (prior to procedure). Nothing further needed.

## 2017-06-01 NOTE — Telephone Encounter (Signed)
LMOVM for Katelyn Kennedy to see if we can get the Super D CT at 11am since the pt is getting prior to the procedure.

## 2017-06-01 NOTE — Telephone Encounter (Signed)
Called to schedule CT Chest prior to procedure.  Unable to get patient's CT Scheduled at 11:00 or 11:30.  Pt will have to be at the [facility] at 10:15 to register for CT at 10:30. Rhonda J Cobb

## 2017-06-03 ENCOUNTER — Other Ambulatory Visit: Payer: Self-pay

## 2017-06-03 ENCOUNTER — Encounter
Admission: RE | Admit: 2017-06-03 | Discharge: 2017-06-03 | Disposition: A | Discharge status: Home / Self Care | Payer: PPO | Source: Ambulatory Visit | Attending: Internal Medicine | Admitting: Internal Medicine

## 2017-06-03 NOTE — Patient Instructions (Signed)
Your procedure is scheduled on: Tues. 06/08/17 Report to Radiology. 11:00   Remember: Instructions that are not followed completely may result in serious medical risk, up to and including death, or upon the discretion of your surgeon and anesthesiologist your surgery may need to be rescheduled.     _X__ 1. Do not eat food after midnight the night before your procedure.                 No gum chewing or hard candies. You may drink clear liquids up to 2 hours                 before you are scheduled to arrive for your surgery- DO not drink clear                 liquids within 2 hours of the start of your surgery.                 Clear Liquids include:  water,                 Black Coffee               __X__2.  On the morning of surgery brush your teeth with toothpaste and water, you                 may rinse your mouth with mouthwash if you wish.  Do not swallow any              toothpaste of mouthwash.     _X__ 3.  No Alcohol for 24 hours before or after surgery.   ___ 4.  Do Not Smoke or use e-cigarettes For 24 Hours Prior to Your Surgery.                 Do not use any chewable tobacco products for at least 6 hours prior to                 surgery.  ____  5.  Bring all medications with you on the day of surgery if instructed.   _x___  6.  Notify your doctor if there is any change in your medical condition      (cold, fever, infections).     Do not wear jewelry, make-up, hairpins, clips or nail polish. Do not wear lotions, powders, or perfumes. You may wear deodorant. Do not shave 48 hours prior to surgery. Men may shave face and neck. Do not bring valuables to the hospital.    [facility] is not responsible for any belongings or valuables.  Contacts, dentures or bridgework may not be worn into surgery. Leave your suitcase in the car. After surgery it may be brought to your room. For patients admitted to the hospital, discharge time is determined by  your treatment team.   Patients discharged the day of surgery will not be allowed to drive home.   Please read over the following fact sheets that you were given:    _x___ Take these medicines the morning of surgery with A SIP OF WATER:    1. acetaminophen (TYLENOL) 500 MG tablet if needed  2. atorvastatin (LIPITOR) 20 MG tablet  3. fluticasone (FLONASE) 50 MCG/ACT nasal spray  4.  5.  6.  ____ Fleet Enema (as directed)   ____ Use CHG Soap as directed  ____ Use inhalers on the day of surgery  ____ Stop metformin 2 days prior to surgery    ____  Take 1/2 of usual insulin dose the night before surgery. No insulin the morning          of surgery.   ____ Stop Coumadin/Plavix/aspirin on   ____ Stop Anti-inflammatories on    ____ Stop supplements until after surgery.    ____ Bring C-Pap to the hospital.

## 2017-06-08 ENCOUNTER — Ambulatory Visit: Payer: PPO | Admitting: Anesthesiology

## 2017-06-08 ENCOUNTER — Ambulatory Visit
Admission: RE | Admit: 2017-06-08 | Discharge: 2017-06-08 | Disposition: A | Discharge status: Home / Self Care | Payer: PPO | Source: Ambulatory Visit | Attending: Internal Medicine | Admitting: Internal Medicine

## 2017-06-08 ENCOUNTER — Ambulatory Visit: Payer: PPO

## 2017-06-08 ENCOUNTER — Encounter
Admission: RE | Disposition: A | Discharge status: Home / Self Care | Payer: Self-pay | Source: Ambulatory Visit | Attending: Internal Medicine

## 2017-06-08 DIAGNOSIS — E119 Type 2 diabetes mellitus without complications: Secondary | ICD-10-CM | POA: Diagnosis not present

## 2017-06-08 DIAGNOSIS — C679 Malignant neoplasm of bladder, unspecified: Secondary | ICD-10-CM | POA: Diagnosis not present

## 2017-06-08 DIAGNOSIS — E875 Hyperkalemia: Secondary | ICD-10-CM | POA: Insufficient documentation

## 2017-06-08 DIAGNOSIS — Z803 Family history of malignant neoplasm of breast: Secondary | ICD-10-CM | POA: Diagnosis not present

## 2017-06-08 DIAGNOSIS — R591 Generalized enlarged lymph nodes: Secondary | ICD-10-CM

## 2017-06-08 DIAGNOSIS — Z79899 Other long term (current) drug therapy: Secondary | ICD-10-CM | POA: Insufficient documentation

## 2017-06-08 DIAGNOSIS — Z86010 Personal history of colonic polyps: Secondary | ICD-10-CM | POA: Diagnosis not present

## 2017-06-08 DIAGNOSIS — J439 Emphysema, unspecified: Secondary | ICD-10-CM | POA: Diagnosis not present

## 2017-06-08 DIAGNOSIS — Z833 Family history of diabetes mellitus: Secondary | ICD-10-CM | POA: Insufficient documentation

## 2017-06-08 DIAGNOSIS — R918 Other nonspecific abnormal finding of lung field: Secondary | ICD-10-CM | POA: Diagnosis not present

## 2017-06-08 DIAGNOSIS — I700 Atherosclerosis of aorta: Secondary | ICD-10-CM | POA: Insufficient documentation

## 2017-06-08 DIAGNOSIS — C3431 Malignant neoplasm of lower lobe, right bronchus or lung: Secondary | ICD-10-CM | POA: Diagnosis not present

## 2017-06-08 DIAGNOSIS — Z9842 Cataract extraction status, left eye: Secondary | ICD-10-CM | POA: Insufficient documentation

## 2017-06-08 DIAGNOSIS — F17210 Nicotine dependence, cigarettes, uncomplicated: Secondary | ICD-10-CM | POA: Diagnosis not present

## 2017-06-08 DIAGNOSIS — Z800 Family history of malignant neoplasm of digestive organs: Secondary | ICD-10-CM | POA: Diagnosis not present

## 2017-06-08 DIAGNOSIS — R599 Enlarged lymph nodes, unspecified: Secondary | ICD-10-CM

## 2017-06-08 DIAGNOSIS — I2510 Atherosclerotic heart disease of native coronary artery without angina pectoris: Secondary | ICD-10-CM | POA: Insufficient documentation

## 2017-06-08 DIAGNOSIS — J309 Allergic rhinitis, unspecified: Secondary | ICD-10-CM | POA: Insufficient documentation

## 2017-06-08 DIAGNOSIS — E7800 Pure hypercholesterolemia, unspecified: Secondary | ICD-10-CM | POA: Diagnosis not present

## 2017-06-08 DIAGNOSIS — C771 Secondary and unspecified malignant neoplasm of intrathoracic lymph nodes: Secondary | ICD-10-CM | POA: Diagnosis not present

## 2017-06-08 DIAGNOSIS — I10 Essential (primary) hypertension: Secondary | ICD-10-CM | POA: Insufficient documentation

## 2017-06-08 DIAGNOSIS — M1990 Unspecified osteoarthritis, unspecified site: Secondary | ICD-10-CM | POA: Diagnosis not present

## 2017-06-08 DIAGNOSIS — Z9841 Cataract extraction status, right eye: Secondary | ICD-10-CM | POA: Diagnosis not present

## 2017-06-08 HISTORY — PX: ELECTROMAGNETIC NAVIGATION BROCHOSCOPY: SHX5369

## 2017-06-08 LAB — GLUCOSE, CAPILLARY
Glucose-Capillary: 115 mg/dL — ABNORMAL HIGH (ref 65–99)
Glucose-Capillary: 127 mg/dL — ABNORMAL HIGH (ref 65–99)

## 2017-06-08 SURGERY — ELECTROMAGNETIC NAVIGATION BRONCHOSCOPY
Anesthesia: General

## 2017-06-08 MED ORDER — PROPOFOL 10 MG/ML IV BOLUS
INTRAVENOUS | Status: DC | PRN
  Administered 2017-06-08: 120 mg via INTRAVENOUS

## 2017-06-08 MED ORDER — ONDANSETRON HCL 4 MG/2ML IJ SOLN: 4.0000 mg | Freq: Once | INTRAMUSCULAR | Status: DC | PRN

## 2017-06-08 MED ORDER — FAMOTIDINE 20 MG PO TABS
20.0000 mg | ORAL_TABLET | Freq: Once | ORAL | Status: AC
  Administered 2017-06-08: 20 mg via ORAL

## 2017-06-08 MED ORDER — SUCCINYLCHOLINE CHLORIDE 20 MG/ML IJ SOLN
INTRAMUSCULAR | Status: DC | PRN
  Administered 2017-06-08: 100 mg via INTRAVENOUS

## 2017-06-08 MED ORDER — DEXAMETHASONE SODIUM PHOSPHATE 10 MG/ML IJ SOLN
INTRAMUSCULAR | Status: DC | PRN
  Administered 2017-06-08: 5 mg via INTRAVENOUS

## 2017-06-08 MED ORDER — LIDOCAINE HCL (CARDIAC) 20 MG/ML IV SOLN
INTRAVENOUS | Status: DC | PRN
  Administered 2017-06-08: 60 mg via INTRAVENOUS

## 2017-06-08 MED ORDER — FENTANYL CITRATE (PF) 100 MCG/2ML IJ SOLN
INTRAMUSCULAR | Status: DC | PRN
  Administered 2017-06-08 (×3): 50 ug via INTRAVENOUS

## 2017-06-08 MED ORDER — DEXAMETHASONE SODIUM PHOSPHATE 10 MG/ML IJ SOLN
INTRAMUSCULAR | Status: AC
  Filled 2017-06-08: qty 1

## 2017-06-08 MED ORDER — SODIUM CHLORIDE 0.9 % IV SOLN
INTRAVENOUS | Status: DC
  Administered 2017-06-08: 12:00:00 via INTRAVENOUS

## 2017-06-08 MED ORDER — ONDANSETRON HCL 4 MG/2ML IJ SOLN
INTRAMUSCULAR | Status: DC | PRN
  Administered 2017-06-08: 4 mg via INTRAVENOUS

## 2017-06-08 MED ORDER — ROCURONIUM BROMIDE 100 MG/10ML IV SOLN
INTRAVENOUS | Status: DC | PRN
  Administered 2017-06-08: 5 mg via INTRAVENOUS
  Administered 2017-06-08 (×2): 10 mg via INTRAVENOUS

## 2017-06-08 MED ORDER — SUCCINYLCHOLINE CHLORIDE 20 MG/ML IJ SOLN
INTRAMUSCULAR | Status: AC
  Filled 2017-06-08: qty 1

## 2017-06-08 MED ORDER — FENTANYL CITRATE (PF) 100 MCG/2ML IJ SOLN
INTRAMUSCULAR | Status: AC
  Filled 2017-06-08: qty 2

## 2017-06-08 MED ORDER — MIDAZOLAM HCL 2 MG/2ML IJ SOLN
INTRAMUSCULAR | Status: AC
  Filled 2017-06-08: qty 2

## 2017-06-08 MED ORDER — PHENYLEPHRINE HCL 10 MG/ML IJ SOLN
INTRAMUSCULAR | Status: DC | PRN
  Administered 2017-06-08 (×5): 100 ug via INTRAVENOUS

## 2017-06-08 MED ORDER — FAMOTIDINE 20 MG PO TABS
ORAL_TABLET | ORAL | Status: AC
  Administered 2017-06-08: 20 mg via ORAL
  Filled 2017-06-08: qty 1

## 2017-06-08 MED ORDER — PROPOFOL 10 MG/ML IV BOLUS
INTRAVENOUS | Status: AC
  Filled 2017-06-08: qty 20

## 2017-06-08 MED ORDER — ROCURONIUM BROMIDE 50 MG/5ML IV SOLN
INTRAVENOUS | Status: AC
  Filled 2017-06-08: qty 1

## 2017-06-08 MED ORDER — FENTANYL CITRATE (PF) 100 MCG/2ML IJ SOLN: 25.0000 ug | INTRAMUSCULAR | Status: DC | PRN

## 2017-06-08 MED ORDER — SUGAMMADEX SODIUM 200 MG/2ML IV SOLN
INTRAVENOUS | Status: AC
  Filled 2017-06-08: qty 2

## 2017-06-08 MED ORDER — ONDANSETRON HCL 4 MG/2ML IJ SOLN
INTRAMUSCULAR | Status: AC
  Filled 2017-06-08: qty 2

## 2017-06-08 MED ORDER — SUGAMMADEX SODIUM 200 MG/2ML IV SOLN
INTRAVENOUS | Status: DC | PRN
  Administered 2017-06-08: 120 mg via INTRAVENOUS

## 2017-06-08 MED ORDER — LIDOCAINE HCL (PF) 1 % IJ SOLN
INTRAMUSCULAR | Status: AC
  Filled 2017-06-08: qty 5

## 2017-06-08 NOTE — Transfer of Care (Signed)
Immediate Anesthesia Transfer of Care Note  Patient: Katelyn Kennedy  Procedure(s) Performed: ELECTROMAGNETIC NAVIGATION BRONCHOSCOPY (N/A )  Patient Location: PACU  Anesthesia Type:General  Level of Consciousness: drowsy and patient cooperative  Airway & Oxygen Therapy: Patient Spontanous Breathing and Patient connected to face mask oxygen  Post-op Assessment: Report given to RN and Post -op Vital signs reviewed and stable  Post vital signs: Reviewed and stable  Last Vitals:  Vitals:   06/08/17 1446 06/08/17 1501  BP: 126/69 114/67  Pulse: (!) 102 88  Resp: (!) 22 13  Temp: 36.8 C   SpO2: 100% 97%    Last Pain:  Vitals:   06/08/17 1200  TempSrc: Oral         Complications: No apparent anesthesia complications

## 2017-06-08 NOTE — Anesthesia Preprocedure Evaluation (Signed)
Anesthesia Evaluation  Patient identified by MRN, date of birth, ID band Patient awake    Reviewed: Allergy & Precautions, NPO status , Patient's Chart, lab work & pertinent test results, reviewed documented beta blocker date and time   Airway Mallampati: II  TM Distance: >3 FB     Dental  (+) Chipped   Pulmonary shortness of breath, former smoker,           Cardiovascular      Neuro/Psych    GI/Hepatic   Endo/Other  diabetes, Type 2  Renal/GU      Musculoskeletal  (+) Arthritis ,   Abdominal   Peds  Hematology   Anesthesia Other Findings   Reproductive/Obstetrics                             Anesthesia Physical Anesthesia Plan  ASA: III  Anesthesia Plan: General   Post-op Pain Management:    Induction: Intravenous  PONV Risk Score and Plan:   Airway Management Planned: Oral ETT  Additional Equipment:   Intra-op Plan:   Post-operative Plan:   Informed Consent: I have reviewed the patients History and Physical, chart, labs and discussed the procedure including the risks, benefits and alternatives for the proposed anesthesia with the patient or authorized representative who has indicated his/her understanding and acceptance.     Plan Discussed with: CRNA  Anesthesia Plan Comments:         Anesthesia Quick Evaluation

## 2017-06-08 NOTE — Anesthesia Post-op Follow-up Note (Signed)
Anesthesia QCDR form completed.        

## 2017-06-08 NOTE — Discharge Instructions (Signed)
AMBULATORY SURGERY  DISCHARGE INSTRUCTIONS   1) The drugs that you were given will stay in your system until tomorrow so for the next 24 hours you should not:  A) Drive an automobile B) Make any legal decisions C) Drink any alcoholic beverage   2) You may resume regular meals tomorrow.  Today it is better to start with liquids and gradually work up to solid foods.  You may eat anything you prefer, but it is better to start with liquids, then soup and crackers, and gradually work up to solid foods.   3) Please notify your doctor immediately if you have any unusual bleeding, trouble breathing, redness and pain at the surgery site, drainage, fever, or pain not relieved by medication.    4) Additional Instructions:  Will have brain scan tomorrow and see her oncologist tomorrow who will be in touch with Dr. Mortimer Fries    Please contact your physician with any problems or Same Day Surgery at [phone], Monday through Friday 6 am to 4 pm, or [place] at [facility] number at [phone].

## 2017-06-08 NOTE — Op Note (Signed)
PROCEDURE: ENDOBRONCHIAL ULTRASOUND   PROCEDURE DATE: 06/08/2017  TIME:  NAME:  Katelyn Kennedy  DOB:[birth year]  MRN: [id number] LOC:  ARPO/None     CODE STATUS:   Code Status History    This patient does not have a recorded code status. Please follow your organizational policy for patients in this situation.          Indications/Preliminary Diagnosis:   Consent: (Place X beside choice/s below)  The benefits, risks and possible complications of the procedure were        explained to:  __x_ patient  ___ patient's family  ___ other:___________  who verbalized understanding and gave:  ___ verbal  ___ written  _x__ verbal and written  ___ telephone  ___ other:________ consent.      Unable to obtain consent; procedure performed on emergent basis.     Other:       PRESEDATION ASSESSMENT: History and Physical has been performed. Patient meds and allergies have been reviewed. Presedation airway examination has been performed and documented. Baseline vital signs, sedation score, oxygenation status, and cardiac rhythm were reviewed. Patient was deemed to be in satisfactory condition to undergo the procedure.    PREMEDICATIONS: SEE ANESTHESIOLOGY RECORDS   Airway Prep (Place X beside choice below)   1% Transtracheal Lidocaine Anesthetization 7 cc   Patient prepped per Bronchoscopy Lab Policy       Insertion Route (Place X beside choice below)   Nasal   Oral   Endotracheal Tube   Tracheostomy   INTRAPROCEDURE MEDICATIONS: SEE ANESTHESIOLOGY RECORDS   PROCEDURE DETAILS: Timeout performed and correct patient, name, & ID confirmed. Following prep per Pulmonary policy, appropriate sedation was administered.  Airway exam proceeded with findings, technical procedures, and specimen collection as noted below. At the end of exam the scope was withdrawn without incident. Impression and Plan as noted below.       TECHNICAL PROCEDURES: (Place X beside choice below)   Procedures   Description    None     Electrocautery     Cryotherapy     Balloon Dilatation     Bronchography     Stent Placement     Therapeutic Aspiration     Laser/Argon Plasma    Brachytherapy Catheter Placement    Foreign Body Removal     SPECIMENS (Sites): (Place X beside choice below)  Specimens Description   No Specimens Obtained     Washings    Lavage    Biopsies   x Fine Needle Aspirates 5   Brushings    Sputum    FINDINGS: subcarinal adenopathy  approx 2x2 CM in size I was able to obtain 5 samples with 21 Gauge needle  ESTIMATED BLOOD LOSS: none COMPLICATIONS/RESOLUTION: none       IMPRESSION:POST-PROCEDURE DX:  Adenopathy-atypical cells   RECOMMENDATION/PLAN:   Follow up Pathology results   Corrin Pogosyan, M.D.  [facility]  Medical Director [place] Director [place] Department

## 2017-06-08 NOTE — Interval H&P Note (Signed)
History and Physical Interval Note:  06/08/2017 12:55 PM  Katelyn Kennedy  has presented today for surgery, with the diagnosis of lung mass  The various methods of treatment have been discussed with the patient and family. After consideration of risks, benefits and other options for treatment, the patient has consented to  Procedure(s): ELECTROMAGNETIC NAVIGATION BRONCHOSCOPY (N/A) as a surgical intervention .  The patient's history has been reviewed, patient examined, no change in status, stable for surgery.  I have reviewed the patient's chart and labs.  Questions were answered to the patient's satisfaction.     Flora Lipps

## 2017-06-08 NOTE — Anesthesia Procedure Notes (Signed)
Procedure Name: Intubation Date/Time: 06/08/2017 1:09 PM Performed by: Dionne Bucy, CRNA Pre-anesthesia Checklist: Patient identified, Patient being monitored, Timeout performed, Emergency Drugs available and Suction available Patient Re-evaluated:Patient Re-evaluated prior to induction Oxygen Delivery Method: Circle system utilized Preoxygenation: Pre-oxygenation with 100% oxygen Induction Type: IV induction Ventilation: Mask ventilation without difficulty Laryngoscope Size: Mac and 3 Grade View: Grade I Tube type: Oral Tube size: 8.5 mm Number of attempts: 1 Airway Equipment and Method: Stylet Placement Confirmation: ETT inserted through vocal cords under direct vision,  positive ETCO2 and breath sounds checked- equal and bilateral Secured at: 21 cm Tube secured with: Tape Dental Injury: Teeth and Oropharynx as per pre-operative assessment

## 2017-06-08 NOTE — Anesthesia Postprocedure Evaluation (Signed)
Anesthesia Post Note  Patient: Katelyn Kennedy  Procedure(s) Performed: ELECTROMAGNETIC NAVIGATION BRONCHOSCOPY (N/A )  Patient location during evaluation: PACU Anesthesia Type: General Level of consciousness: awake and alert Pain management: pain level controlled Vital Signs Assessment: post-procedure vital signs reviewed and stable Respiratory status: spontaneous breathing, nonlabored ventilation, respiratory function stable and patient connected to nasal cannula oxygen Cardiovascular status: blood pressure returned to baseline and stable Postop Assessment: no apparent nausea or vomiting Anesthetic complications: no     Last Vitals:  Vitals:   06/08/17 1501 06/08/17 1516  BP: 114/67 120/69  Pulse: 88 85  Resp: 13 19  Temp:  37 C  SpO2: 97% 97%    Last Pain:  Vitals:   06/08/17 1200  TempSrc: Oral                 Nahara Dona S

## 2017-06-08 NOTE — Op Note (Signed)
Electromagnetic Navigation Bronchoscopy: Indication: lung mass/nodule  Preoperative Diagnosis:lung nodule/mass Post Procedure Diagnosis:lung nodule/mass Consent: Verbal/Written  The Risks and Benefits of the procedure explained to patient/family prior to start of procedure and I have discussed the risk for acute bleeding, increased chance of infection, increased chance of respiratory failure and cardiac arrest and death.  I have also explained to avoid all types of NSAIDs to decrease chance of bleeding, and to avoid food and drinks the midnight prior to procedure.  The procedure consists of a video camera with a light source to be placed and inserted  into the lungs to  look for abnormal tissue and to obtain tissue samples by using needle and biopsy tools.  The patient/family understand the risks and benefits and have agreed to proceed with procedure.   Hand washing performed prior to starting the procedure.   Type of Anesthesia: see Anesthesiology records .   Procedure Performed:  Virtual Bronchoscopy with Multi-planar Image analysis, 3-D reconstruction of coronal, sagittal and multi-planar images for the purposes of planning real-time bronchoscopy using the i[employer] Electromagnetic Navigation Bronchoscopy System (superDimension).  Description of Procedure: After obtaining informed consent from the patient, the above sedative and anesthetic measures were carried out, flexible fiberoptic bronchoscope was inserted via Endotracheal tube after patient was intubated by CNA/Anesthesiologist.   The virtual camera was then placed into the central portion of the trachea. The trachea itself was inspected.  The main carina, right and left midstem bronchus and all the segmental and subsegmental airways by virtual bronchoscopy were inspected. The camera was directed to standard registration points at the following centers: main carina, right upper lobe bronchus, right lower lobe bronchus, right middle  lobe bronchus, left upper lobe bronchus, and the left lower lobe bronchus. This data was transferred to the i-[employer] ENB system for real-time bronchoscopy.   The scope was then navigated to the RLL  for tissue sampling  Specimans Obtained:  Transbronchial Fine Needle Aspirations 21G times:3  Transbronchial Forceps Biopsy times:13  Transbronchial Triple Needle Brush:2    Fluoroscopy:  Fluoroscopy was utilized during the course of this procedure to assure that biopsies were taken in a safe manner under fluoroscopic guidance with spot films required.   Complications:None  Estimated Blood Loss: minimal approx 1cc  Monitoring:  The patient was monitored with continuous oximetry and received supplemental nasal cannula oxygen throughout the procedure. In addition, serial blood pressure measurements and continuous electrocardiography showed these physiologic parameters to remain tolerable throughout the procedure.   Assessment and Plan/Additional Comments: Follow up Pathology Reports    Corrin Quaranta, M.D.  [facility]  Medical Director [place] Director [facility] Cardio-Pulmonary Department

## 2017-06-09 ENCOUNTER — Other Ambulatory Visit: Payer: Self-pay | Admitting: Oncology

## 2017-06-09 ENCOUNTER — Telehealth: Payer: Self-pay

## 2017-06-09 ENCOUNTER — Inpatient Hospital Stay: Payer: PPO | Attending: Internal Medicine | Admitting: Internal Medicine

## 2017-06-09 ENCOUNTER — Ambulatory Visit (HOSPITAL_COMMUNITY)
Admission: RE | Admit: 2017-06-09 | Discharge: 2017-06-09 | Disposition: A | Discharge status: Home / Self Care | Payer: PPO | Source: Ambulatory Visit | Attending: Oncology | Admitting: Oncology

## 2017-06-09 ENCOUNTER — Encounter: Payer: Self-pay | Admitting: Internal Medicine

## 2017-06-09 ENCOUNTER — Telehealth: Payer: Self-pay | Admitting: Medical Oncology

## 2017-06-09 VITALS — BP 114/53 | HR 85 | Temp 97.7°F | Resp 18 | Ht 59.84 in | Wt 127.3 lb

## 2017-06-09 DIAGNOSIS — M810 Age-related osteoporosis without current pathological fracture: Secondary | ICD-10-CM | POA: Diagnosis not present

## 2017-06-09 DIAGNOSIS — E7800 Pure hypercholesterolemia, unspecified: Secondary | ICD-10-CM

## 2017-06-09 DIAGNOSIS — R0602 Shortness of breath: Secondary | ICD-10-CM

## 2017-06-09 DIAGNOSIS — R918 Other nonspecific abnormal finding of lung field: Secondary | ICD-10-CM | POA: Insufficient documentation

## 2017-06-09 DIAGNOSIS — I10 Essential (primary) hypertension: Secondary | ICD-10-CM

## 2017-06-09 DIAGNOSIS — M129 Arthropathy, unspecified: Secondary | ICD-10-CM

## 2017-06-09 DIAGNOSIS — E785 Hyperlipidemia, unspecified: Secondary | ICD-10-CM | POA: Diagnosis not present

## 2017-06-09 DIAGNOSIS — E119 Type 2 diabetes mellitus without complications: Secondary | ICD-10-CM

## 2017-06-09 DIAGNOSIS — R599 Enlarged lymph nodes, unspecified: Secondary | ICD-10-CM | POA: Diagnosis not present

## 2017-06-09 DIAGNOSIS — K449 Diaphragmatic hernia without obstruction or gangrene: Secondary | ICD-10-CM | POA: Diagnosis not present

## 2017-06-09 DIAGNOSIS — Z800 Family history of malignant neoplasm of digestive organs: Secondary | ICD-10-CM

## 2017-06-09 DIAGNOSIS — Z79899 Other long term (current) drug therapy: Secondary | ICD-10-CM

## 2017-06-09 DIAGNOSIS — F17200 Nicotine dependence, unspecified, uncomplicated: Secondary | ICD-10-CM

## 2017-06-09 DIAGNOSIS — C679 Malignant neoplasm of bladder, unspecified: Secondary | ICD-10-CM | POA: Diagnosis not present

## 2017-06-09 DIAGNOSIS — F5102 Adjustment insomnia: Secondary | ICD-10-CM

## 2017-06-09 DIAGNOSIS — Z803 Family history of malignant neoplasm of breast: Secondary | ICD-10-CM

## 2017-06-09 DIAGNOSIS — D494 Neoplasm of unspecified behavior of bladder: Secondary | ICD-10-CM

## 2017-06-09 DIAGNOSIS — R310 Gross hematuria: Secondary | ICD-10-CM | POA: Diagnosis not present

## 2017-06-09 MED ORDER — TEMAZEPAM 15 MG PO CAPS: 15.0000 mg | ORAL_CAPSULE | Freq: Every evening | ORAL | 0 refills | Status: DC | PRN

## 2017-06-09 MED ORDER — GADOBENATE DIMEGLUMINE 529 MG/ML IV SOLN
10.0000 mL | Freq: Once | INTRAVENOUS | Status: AC | PRN
  Administered 2017-06-09: 10 mL via INTRAVENOUS

## 2017-06-09 NOTE — Telephone Encounter (Signed)
Printed avs and calender of upcoming appointment.per 2/27 los

## 2017-06-09 NOTE — Telephone Encounter (Signed)
Per Julien Nordmann I elft vm on pt phone that pt  MRI was fine.

## 2017-06-09 NOTE — Progress Notes (Signed)
[place] [phone]   [phone]) [phone]  CONSULT NOTE  REFERRING PHYSICIAN: Dr. Flora Lipps  REASON FOR CONSULTATION:  72 years old white female with highly suspicious lung cancer.  HPI Katelyn Kennedy is a 72 y.o. female with past medical history significant for allergic rhinitis, osteoarthritis, diabetes mellitus, history of cystic breast disease status post biopsy as well as long history for smoking but quit April 22, 2017.  The patient mentioned that she presented to the hospital complaining of blood in her urine.  She had CT scan of the abdomen performed on April 22, 2017 and that showed 1.0 cm polypoid lesion in the posterior left bladder wall highly suspicious for urothelial neoplasm.  The scan also showed 3.4 cm soft tissue lesion in the inferior right lower lobe suspicious for primary bronchogenic neoplasm.  There was a small associated nodule in the posterior right lower lobe.  Her bladder tumor was resected and the patient was treated with BCG by her urologist in [facility].  She was seen by Dr. Janese Banks and a PET scan was performed on May 26, 2017 and it showed an irregular hypermetabolic 3.4 cm right lower lobe lung mass suspicious for primary bronchogenic carcinoma.  There was also hypermetabolic subcarinal adenopathy compatible with nodal metastasis.  The scan also showed few solid pulmonary nodules in the right lung the largest measures 0.7 cm in the right lower lobe below the PET resolution but ipsilateral pulmonary metastasis cannot be excluded.  There was no evidence of hypermetabolic metastatic disease in the abdomen, pelvis or skeleton.  The patient was referred to Dr. Mortimer Fries.  She underwent video bronchoscopy with electromagnetic navigational bronchoscopy on 06/08/2017.  The final pathology is a still pending. The patient also had MRI of the brain performed earlier today and this was negative for metastatic disease to the  brain. Dr. Mortimer Fries kindly referred the patient to me today for evaluation and recommendation regarding treatment of her condition as the patient requested her care to be done in [place] close to home. When seen today she is feeling fine with no specific complaints except for being tired she also has lack of sleep secondary to anxiety.  She continues to have mild nausea with no vomiting, diarrhea or constipation.  She denied having any chest pain but continues to have shortness of breath with exertion with no cough or hemoptysis.  She has no significant weight loss or night sweats. Family history significant for father died in an accident, sister had breast cancer and mother still alive at age 91. The patient is single and has 2 daughters.  She lives in [place].  She used to work as a [employer] manager.  She has a history for smoking less than 1 pack/day for around 50 years and quit April 22, 2017.  She has no history of alcohol or drug abuse.  HPI  Past Medical History:  Diagnosis Date  . Allergic rhinitis   . Arthritis   . Diabetes mellitus without complication (HCC)    diet control/no meds per pt  . Dyspnea   . History of chicken pox   . Smoker     Past Surgical History:  Procedure Laterality Date  . ANKLE FRACTURE SURGERY Left 10 years ago   "hard to wake up from anesthesia" per pt  . [place]  . BREAST BIOPSY Right 1978   BENIGN CYST  . BREAST EXCISIONAL BIOPSY Left 2011   Benign  . BREAST  EXCISIONAL BIOPSY Right 1985   Benign   . CATARACT EXTRACTION    . CATARACT EXTRACTION W/ INTRAOCULAR LENS IMPLANT Bilateral   . COLONOSCOPY  2011  . ELECTROMAGNETIC NAVIGATION BROCHOSCOPY N/A 06/08/2017   Procedure: ELECTROMAGNETIC NAVIGATION BRONCHOSCOPY;  Surgeon: Flora Lipps, MD;  Location: [facility] ORS;  Service: Cardiopulmonary;  Laterality: N/A;  . POLYPECTOMY  2011  . TRANSURETHRAL RESECTION OF BLADDER TUMOR WITH MITOMYCIN-C N/A 05/04/2017   Procedure: TRANSURETHRAL RESECTION  OF BLADDER TUMOR WITH gemcitabine;  Surgeon: Abbie Sons, MD;  Location: [facility] ORS;  Service: Urology;  Laterality: N/A;  . TUBAL LIGATION      Family History  Problem Relation Age of Onset  . Diabetes Mother   . Cancer Sister 32       BREAST  . Breast cancer Sister   . Cancer Maternal Grandmother        COLON  . Colon cancer Maternal Grandmother 86  . Stomach cancer Neg Hx     Social History Social History   Tobacco Use  . Smoking status: Former Smoker    Packs/day: 0.50    Years: 40.00    Pack years: 20.00    Types: Cigarettes    Last attempt to quit: 04/29/2017    Years since quitting: 0.1  . Smokeless tobacco: Never Used  Substance Use Topics  . Alcohol use: Yes    Comment: occassionally  . Drug use: No    Allergies  Allergen Reactions  . Codeine Nausea Only    Current Outpatient Medications  Medication Sig Dispense Refill  . acetaminophen (TYLENOL) 500 MG tablet Take 500 mg by mouth daily as needed for moderate pain or headache.    Marland Kitchen atorvastatin (LIPITOR) 20 MG tablet TAKE 1 TABLET BY MOUTH DAILY (Patient taking differently: Take 20 mg by mouth daily) 90 tablet 3  . Calcium Carbonate-Vitamin D (CALCIUM + D) 600-200 MG-UNIT per tablet Take 1 tablet by mouth daily.     . fluticasone (FLONASE) 50 MCG/ACT nasal spray PLACE 2 SPRAYS INTO THE NOSE DAILY. (Patient taking differently: PLACE 2 SPRAYS INTO THE NOSE ONCE DAILY AS NEEDED FOR ALLERGIES) 16 g 5  . lisinopril (PRINIVIL,ZESTRIL) 5 MG tablet Take 0.5 tablets (2.5 mg total) by mouth daily. 45 tablet 3  . Multiple Vitamin (MULTIVITAMIN) capsule Take 1 capsule by mouth daily.      . Tetrahydrozoline HCl (VISINE OP) Place 1 drop into both eyes daily as needed (for dry eyes).     . traMADol (ULTRAM) 50 MG tablet Take 1 tablet (50 mg total) by mouth every 6 (six) hours as needed for moderate pain. (Patient not taking: Reported on 05/28/2017) 15 tablet 0   No current facility-administered medications for this visit.      Review of Systems  Constitutional: positive for fatigue Eyes: negative Ears, nose, mouth, throat, and face: negative Respiratory: positive for dyspnea on exertion Cardiovascular: negative Gastrointestinal: negative Genitourinary:negative Integument/breast: negative Hematologic/lymphatic: negative Musculoskeletal:negative Neurological: negative Behavioral/Psych: negative Endocrine: negative Allergic/Immunologic: negative  Physical Exam  TFT:DDUKG, healthy, no distress, well nourished, well developed and anxious SKIN: skin color, texture, turgor are normal, no rashes or significant lesions HEAD: Normocephalic, No masses, lesions, tenderness or abnormalities EYES: normal, PERRLA, Conjunctiva are pink and non-injected EARS: External ears normal, Canals clear OROPHARYNX:no exudate, no erythema and lips, buccal mucosa, and tongue normal  NECK: supple, no adenopathy, no JVD LYMPH:  no palpable lymphadenopathy, no hepatosplenomegaly BREAST:not examined LUNGS: expiratory wheezes bilaterally HEART: regular rate & rhythm, no murmurs and  no gallops ABDOMEN:abdomen soft, non-tender, normal bowel sounds and no masses or organomegaly BACK: No CVA tenderness, Range of motion is normal EXTREMITIES:no joint deformities, effusion, or inflammation, no edema, no skin discoloration  NEURO: alert & oriented x 3 with fluent speech, no focal motor/sensory deficits  PERFORMANCE STATUS: ECOG 1  LABORATORY DATA: Lab Results  Component Value Date   WBC 9.8 03/16/2017   HGB 15.0 03/16/2017   HCT 44.1 03/16/2017   MCV 97.8 03/16/2017   PLT 297.0 03/16/2017      Chemistry      Component Value Date/Time   NA 137 03/16/2017 1614   K 4.4 03/16/2017 1614   CL 101 03/16/2017 1614   CO2 27 03/16/2017 1614   BUN 18 03/16/2017 1614   CREATININE 0.73 03/16/2017 1614      Component Value Date/Time   CALCIUM 10.4 03/16/2017 1614   ALKPHOS 65 02/02/2017 1219   AST 23 02/02/2017 1219   ALT 21  02/02/2017 1219   BILITOT 0.5 02/02/2017 1219       RADIOGRAPHIC STUDIES: Nm Pet Image Initial (pi) Skull Base To Thigh  Result Date: 05/26/2017 CLINICAL DATA:  Initial treatment strategy for right lower lobe lung mass and pulmonary nodule detected on recent hematuria protocol CT study. Recent diagnosis bladder cancer status post TURBT 05/04/2017. EXAM: NUCLEAR MEDICINE PET SKULL BASE TO THIGH TECHNIQUE: 12.3 mCi F-18 FDG was injected intravenously. Full-ring PET imaging was performed from the skull base to thigh after the radiotracer. CT data was obtained and used for attenuation correction and anatomic localization. Mediastinal Blood Pool Activity (max SUV): 2.5 FASTING BLOOD GLUCOSE:  Value: 108 mg/dl COMPARISON:  04/22/2017 CT abdomen/pelvis. FINDINGS: NECK: No hypermetabolic lymph nodes in the neck. Partially calcified subcentimeter bilateral thyroid nodules, non hypermetabolic. CHEST: Irregular hypermetabolic 3.4 x 1.9 cm right lower lobe lung mass (series 3/image 115) with max SUV 14.8, not appreciably changed in size since 04/22/2017 chest CT. Basilar right lower lobe 7 mm solid pulmonary nodule (series 3/image 128) with additional smaller scattered tiny pulmonary nodules in the right upper lobe, below PET resolution. No significant pulmonary nodules in the left lung. No acute consolidative airspace disease. No pneumothorax or pleural effusions. Mildly enlarged hypermetabolic 1.0 cm right subcarinal node with max SUV 8.8 (series 3/image 95). No hypermetabolic hilar nodes. No additional hypermetabolic mediastinal nodes. No hypermetabolic axillary nodes. Coronary atherosclerosis. Atherosclerotic nonaneurysmal thoracic aorta. ABDOMEN/PELVIS: No abnormal hypermetabolic activity within the liver, pancreas, adrenal glands, or spleen. No hypermetabolic lymph nodes in the abdomen or pelvis. Small hiatal hernia. Scattered moderate colonic diverticulosis, most prominent in the sigmoid colon. Atherosclerotic  nonaneurysmal abdominal aorta. Bladder is collapsed and not well evaluated. SKELETON: No focal hypermetabolic activity to suggest skeletal metastasis. Marked degenerative changes in the lumbar spine with associated mild FDG uptake. IMPRESSION: 1. Irregular hypermetabolic 3.4 cm right lower lobe lung mass. Primary bronchogenic carcinoma is favored. 2. Hypermetabolic subcarinal adenopathy compatible with nodal metastasis. 3. Few solid pulmonary nodules in the right lung, largest 7 mm in the right lower lobe, below PET resolution, cannot exclude ipsilateral pulmonary metastases. Recommend attention on follow-up chest CT in 3 months. 4. No evidence of hypermetabolic metastatic disease in the abdomen, pelvis or skeleton. 5. Chronic findings include: Aortic Atherosclerosis (ICD10-I70.0). Coronary atherosclerosis. Small hiatal hernia. Electronically Signed   By: Ilona Sorrel M.D.   On: 05/26/2017 10:06   Dg C-arm 1-60 Min-no Report  Result Date: 06/08/2017 Fluoroscopy was utilized by the requesting physician.  No radiographic interpretation.  Ct Super D Chest Wo Contrast  Result Date: 06/08/2017 CLINICAL DATA:  Hypermetabolic right lower lobe lung mass. Preoperative for endobronchial biopsy. History of bladder cancer status post TURBT 05/04/2017. EXAM: CT CHEST WITHOUT CONTRAST TECHNIQUE: Multidetector CT imaging of the chest was performed using thin slice collimation for electromagnetic bronchoscopy planning purposes, without intravenous contrast. COMPARISON:  05/26/2017 PET-CT. FINDINGS: Cardiovascular: Normal heart size. No significant pericardial fluid/thickening. Left main, left anterior descending and right coronary atherosclerosis. Atherosclerotic nonaneurysmal thoracic aorta. Normal caliber pulmonary arteries. Mediastinum/Nodes: No discrete thyroid nodules. Unremarkable esophagus. No axillary adenopathy. Stable mildly enlarged 1.0 cm subcarinal node (series 2/image 29). No additional pathologically  enlarged mediastinal nodes. No discrete hilar adenopathy this noncontrast scan. Lungs/Pleura: No pneumothorax. No pleural effusion. Mild centrilobular emphysema. Persistent anterior right lower lobe 3.4 x 1.9 cm lung mass (series 3/image 40). Stable scattered solid pulmonary nodules in the right lung, largest 7 mm in the basilar right lower lobe (series 3/image 47). No new significant pulmonary nodules. No acute consolidative airspace disease. Upper abdomen: Two scattered subcentimeter hypodense liver lesions are too small to characterize and stable since 04/22/2017 CT. Musculoskeletal: No aggressive appearing focal osseous lesions. Moderate thoracic spondylosis. IMPRESSION: 1. Persistent solid 3.4 cm anterior right lower lobe lung mass, suspicious for primary bronchogenic carcinoma given hypermetabolism on 05/26/2017 PET-CT. 2. Stable scattered small solid pulmonary nodules in the right lung, largest 0.7 cm in the basilar right lower lobe. Pulmonary metastases not excluded. Recommend attention on follow-up chest CT in 3-6 months. 3. Stable mild subcarinal lymphadenopathy, suspicious for nodal metastasis given hypermetabolism on 05/26/2017 PET-CT. 4. Left main and two-vessel coronary atherosclerosis. Aortic Atherosclerosis (ICD10-I70.0) and Emphysema (ICD10-J43.9). Electronically Signed   By: Ilona Sorrel M.D.   On: 06/08/2017 13:56    ASSESSMENT: This is a very pleasant 72 years old white female with highly suspicious for stage IV (T2a, N2, M1a) non-small cell lung cancer pending tissue diagnosis and presenting with right lower lobe lung mass in addition to subcarinal lymphadenopathy and suspicious metastatic pulmonary nodules.  PLAN: I had a lengthy discussion with the patient and her sister today about her current disease stage, prognosis and treatment options. I personally and independently reviewed the scan images and discussed the results and showed the images to the patient and her sister today. The  patient has at least a stage IIIa non-small cell lung cancer presenting with right lower lobe lung mass and subcarinal lymphadenopathy but there is also concerning pulmonary nodules suspicious for metastasis. I discussed with the patient several options for manner condition.  I recommended for her first with for the results of the biopsy for confirmation of the tissue diagnosis. The patient could be a candidate for a course of concurrent chemoradiation to the known right lower lobe lung mass and subcarinal lymphadenopathy with close monitoring of the other pulmonary nodules versus treatment with palliative systemic chemotherapy. I would refer the patient to radiation oncology for evaluation and discussion of the radiotherapy option. I will also ask the pathology department to send the tissue block for molecular studies if it is consistent with adenocarcinoma. For insomnia, I started the patient on Restoril 15 mg p.o. Nightly as needed. The patient will come back for follow-up visit in 1 week for reevaluation and more discussion of her treatment options based on the final pathology results. She was advised to call immediately if she has any concerning symptoms in the interval. The patient voices understanding of current disease status and treatment options and is in agreement with  the current care plan.  All questions were answered. The patient knows to call the clinic with any problems, questions or concerns. We can certainly see the patient much sooner if necessary.  Thank you so much for allowing me to participate in the care of Katelyn Kennedy. I will continue to follow up the patient with you and assist in her care.  I spent 40 minutes counseling the patient face to face. The total time spent in the appointment was 60 minutes.  Disclaimer: This note was dictated with voice recognition software. Similar sounding words can inadvertently be transcribed and may not be corrected upon  review.   Eilleen Kempf June 09, 2017, 11:51 AM

## 2017-06-10 ENCOUNTER — Encounter: Payer: Self-pay | Admitting: Radiation Oncology

## 2017-06-10 LAB — CYTOLOGY - NON PAP

## 2017-06-10 LAB — SURGICAL PATHOLOGY

## 2017-06-10 LAB — POCT I-STAT CREATININE: Creatinine, Ser: 0.7 mg/dL (ref 0.44–1.00)

## 2017-06-11 ENCOUNTER — Encounter: Payer: Self-pay | Admitting: *Deleted

## 2017-06-11 NOTE — Progress Notes (Signed)
Oncology Nurse Navigator Documentation  Oncology Nurse Navigator Flowsheets 06/11/2017  Navigator Location [facility]-  Navigator Encounter Type Other/I received a call from pathology dept at [facility].  There is not enough tissue to be sent for molecular testing. I will update Dr. Julien Nordmann.   Treatment Phase Pre-Tx/Tx Discussion  Barriers/Navigation Needs Coordination of Care  Interventions Coordination of Care  Coordination of Care Other  Acuity Level 1  Time Spent with Patient 15

## 2017-06-11 NOTE — Progress Notes (Signed)
Oncology Nurse Navigator Documentation  Oncology Nurse Navigator Flowsheets 06/11/2017  Navigator Location [facility] [place]  Navigator Encounter Type Other/per Dr. Worthy Flank request. I notified pathology dept to send tissue for foundation one and PDL 1  Treatment Phase Pre-Tx/Tx Discussion  Barriers/Navigation Needs Coordination of Care  Interventions Coordination of Care  Coordination of Care Other  Acuity Level 2  Time Spent with Patient 15

## 2017-06-15 NOTE — Progress Notes (Signed)
Thoracic Location of Tumor / Histology: 3.4 cm soft tissue lesion in the inferior right lower lobe suspicious for primary bronchogenic neoplasm, small associated nodule in the posterior right lower lobe found on CT 04/22/2017.  Pet scan done 05/26/2017 revealed a irregular hypermetabolic 3.4 cm right lower lobe lung mass suspicious for primary bronchogenic carcinoma, hypermetabolic subcarinal adenopathy compatible with nodal metastasis, few solid pulmonary nodules in the right lung with the largest measuring 0.7 cm Right lower lobe.  06/08/2017 video bronchoscopy with electromagnetic navigational bronchoscopy.  MRI 06/09/2017: Negative for metastatic disease to the brain.  Patient presented with symptoms of hematuria and underwent cystoscopy.  Follow up CT abdomen and pelvis showed a 10 mm polypoid lesion in the posterior left bladder wall and a incidental finding of a 3.4 cm mass in her right lower lobe.  Biopsies of Right lower lung 06/08/2017  Tobacco/Marijuana/Snuff/ETOH use:   Past/Anticipated interventions by cardiothoracic surgery, if any:    Past/Anticipated interventions by medical oncology, if any:  1. The patient could be a candidate for a course of concurrent chemoradiation to the known right lower lobe lung mass and subcarinal lymphadenopathy with close monitoring of the other pulmonary nodules versus treatment with palliative systemic chemotherapy.  2. I will also ask the pathology department to send the tissue block for molecular studies if it is consistent with adenocarcinoma.  3. For insomnia, I started the patient on Restoril 15 mg p.o. Nightly as needed.  4. The patient will come back for follow-up visit in 1 week for reevaluation and more discussion of her treatment options based on the final pathology results (06/09/2017).   BP (!) 141/59 (BP Location: Left Arm, Patient Position: Sitting, Cuff Size: Normal)   Pulse 70   Temp 98.5 F (36.9 C) (Oral)   Resp 18   Ht 4\' 11"   (1.499 m)   Wt 128 lb 6.4 oz (58.2 kg)   SpO2 99%   BMI 25.93 kg/m    Wt Readings from Last 3 Encounters:  06/17/17 128 lb 6.4 oz (58.2 kg)  06/17/17 128 lb 9.6 oz (58.3 kg)  06/09/17 127 lb 4.8 oz (57.7 kg)     Signs/Symptoms  Weight changes, if any: No  Respiratory complaints, if any: Shortness of breath with activity and prolonged conversations.   Hemoptysis, if any: Needs to clear throat, non productive cough.  Pain issues, if any:  No  SAFETY ISSUES:  Prior radiation? No  Pacemaker/ICD?  No  Possible current pregnancy? No, tubal ligation  Is the patient on methotrexate? No  Current Complaints / other details:

## 2017-06-17 ENCOUNTER — Encounter: Payer: Self-pay | Admitting: Oncology

## 2017-06-17 ENCOUNTER — Telehealth: Payer: Self-pay | Admitting: Internal Medicine

## 2017-06-17 ENCOUNTER — Inpatient Hospital Stay: Payer: PPO

## 2017-06-17 ENCOUNTER — Ambulatory Visit
Admission: RE | Admit: 2017-06-17 | Discharge: 2017-06-17 | Disposition: A | Discharge status: Home / Self Care | Payer: PPO | Source: Ambulatory Visit | Attending: Radiation Oncology | Admitting: Radiation Oncology

## 2017-06-17 ENCOUNTER — Other Ambulatory Visit: Payer: Self-pay | Admitting: Internal Medicine

## 2017-06-17 ENCOUNTER — Encounter: Payer: Self-pay | Admitting: Radiation Oncology

## 2017-06-17 ENCOUNTER — Inpatient Hospital Stay: Payer: PPO | Attending: Internal Medicine | Admitting: Oncology

## 2017-06-17 ENCOUNTER — Other Ambulatory Visit: Payer: Self-pay

## 2017-06-17 ENCOUNTER — Encounter: Payer: Self-pay | Admitting: *Deleted

## 2017-06-17 VITALS — BP 141/59 | HR 70 | Temp 98.5°F | Resp 18 | Ht 59.0 in | Wt 128.4 lb

## 2017-06-17 VITALS — BP 132/51 | HR 77 | Temp 98.4°F | Resp 18 | Ht 59.84 in | Wt 128.6 lb

## 2017-06-17 DIAGNOSIS — Z79899 Other long term (current) drug therapy: Secondary | ICD-10-CM | POA: Insufficient documentation

## 2017-06-17 DIAGNOSIS — Z9889 Other specified postprocedural states: Secondary | ICD-10-CM | POA: Insufficient documentation

## 2017-06-17 DIAGNOSIS — R0602 Shortness of breath: Secondary | ICD-10-CM | POA: Diagnosis not present

## 2017-06-17 DIAGNOSIS — I700 Atherosclerosis of aorta: Secondary | ICD-10-CM | POA: Insufficient documentation

## 2017-06-17 DIAGNOSIS — F17210 Nicotine dependence, cigarettes, uncomplicated: Secondary | ICD-10-CM | POA: Insufficient documentation

## 2017-06-17 DIAGNOSIS — C3431 Malignant neoplasm of lower lobe, right bronchus or lung: Secondary | ICD-10-CM | POA: Insufficient documentation

## 2017-06-17 DIAGNOSIS — Z5111 Encounter for antineoplastic chemotherapy: Secondary | ICD-10-CM | POA: Diagnosis not present

## 2017-06-17 DIAGNOSIS — Z87891 Personal history of nicotine dependence: Secondary | ICD-10-CM | POA: Diagnosis not present

## 2017-06-17 DIAGNOSIS — Z9851 Tubal ligation status: Secondary | ICD-10-CM | POA: Diagnosis not present

## 2017-06-17 DIAGNOSIS — Z885 Allergy status to narcotic agent status: Secondary | ICD-10-CM | POA: Insufficient documentation

## 2017-06-17 DIAGNOSIS — Z808 Family history of malignant neoplasm of other organs or systems: Secondary | ICD-10-CM | POA: Diagnosis not present

## 2017-06-17 DIAGNOSIS — C679 Malignant neoplasm of bladder, unspecified: Secondary | ICD-10-CM | POA: Diagnosis not present

## 2017-06-17 DIAGNOSIS — Z833 Family history of diabetes mellitus: Secondary | ICD-10-CM | POA: Insufficient documentation

## 2017-06-17 DIAGNOSIS — R599 Enlarged lymph nodes, unspecified: Secondary | ICD-10-CM

## 2017-06-17 DIAGNOSIS — Z803 Family history of malignant neoplasm of breast: Secondary | ICD-10-CM | POA: Diagnosis not present

## 2017-06-17 DIAGNOSIS — G4700 Insomnia, unspecified: Secondary | ICD-10-CM | POA: Diagnosis not present

## 2017-06-17 DIAGNOSIS — J439 Emphysema, unspecified: Secondary | ICD-10-CM

## 2017-06-17 DIAGNOSIS — I2510 Atherosclerotic heart disease of native coronary artery without angina pectoris: Secondary | ICD-10-CM | POA: Insufficient documentation

## 2017-06-17 DIAGNOSIS — R918 Other nonspecific abnormal finding of lung field: Secondary | ICD-10-CM | POA: Insufficient documentation

## 2017-06-17 DIAGNOSIS — Z7189 Other specified counseling: Secondary | ICD-10-CM | POA: Insufficient documentation

## 2017-06-17 DIAGNOSIS — E119 Type 2 diabetes mellitus without complications: Secondary | ICD-10-CM | POA: Diagnosis not present

## 2017-06-17 DIAGNOSIS — Z79891 Long term (current) use of opiate analgesic: Secondary | ICD-10-CM | POA: Diagnosis not present

## 2017-06-17 DIAGNOSIS — F17211 Nicotine dependence, cigarettes, in remission: Secondary | ICD-10-CM | POA: Diagnosis not present

## 2017-06-17 LAB — CMP (CANCER CENTER ONLY)
ALT: 16 U/L (ref 0–55)
AST: 17 U/L (ref 5–34)
Albumin: 3.9 g/dL (ref 3.5–5.0)
Alkaline Phosphatase: 97 U/L (ref 40–150)
Anion gap: 8 (ref 3–11)
BUN: 14 mg/dL (ref 7–26)
CO2: 26 mmol/L (ref 22–29)
Calcium: 10.3 mg/dL (ref 8.4–10.4)
Chloride: 107 mmol/L (ref 98–109)
Creatinine: 0.82 mg/dL (ref 0.60–1.10)
GFR, Est AFR Am: >60 mL/min (ref >60)
GFR, Estimated: >60 mL/min (ref >60)
Glucose, Bld: 110 mg/dL (ref 70–140)
Potassium: 4.7 mmol/L (ref 3.5–5.1)
Sodium: 141 mmol/L (ref 136–145)
Total Bilirubin: 0.4 mg/dL (ref 0.2–1.2)
Total Protein: 7 g/dL (ref 6.4–8.3)

## 2017-06-17 LAB — CBC WITH DIFFERENTIAL (CANCER CENTER ONLY)
Basophils Absolute: 0 10*3/uL (ref 0.0–0.1)
Basophils Relative: 0 %
Eosinophils Absolute: 0.1 10*3/uL (ref 0.0–0.5)
Eosinophils Relative: 1 %
HCT: 39.3 % (ref 34.8–46.6)
Hemoglobin: 13.1 g/dL (ref 11.6–15.9)
Lymphocytes Relative: 27 %
Lymphs Abs: 2 10*3/uL (ref 0.9–3.3)
MCH: 32.3 pg (ref 25.1–34.0)
MCHC: 33.3 g/dL (ref 31.5–36.0)
MCV: 96.8 fL (ref 79.5–101.0)
Monocytes Absolute: 0.7 10*3/uL (ref 0.1–0.9)
Monocytes Relative: 9 %
Neutro Abs: 4.7 10*3/uL (ref 1.5–6.5)
Neutrophils Relative %: 63 %
Platelet Count: 239 10*3/uL (ref 145–400)
RBC: 4.06 MIL/uL (ref 3.70–5.45)
RDW: 12.9 % (ref 11.2–14.5)
WBC Count: 7.5 10*3/uL (ref 3.9–10.3)

## 2017-06-17 MED ORDER — PROCHLORPERAZINE MALEATE 10 MG PO TABS: 10.0000 mg | ORAL_TABLET | Freq: Four times a day (QID) | ORAL | 0 refills | Status: DC | PRN

## 2017-06-17 NOTE — Patient Instructions (Signed)
Carboplatin injection What is this medicine? CARBOPLATIN (KAR boe pla tin) is a chemotherapy drug. It targets fast dividing cells, like cancer cells, and causes these cells to die. This medicine is used to treat ovarian cancer and many other cancers. This medicine may be used for other purposes; ask your health care provider or pharmacist if you have questions. COMMON BRAND NAME(S): Paraplatin What should I tell my health care provider before I take this medicine? They need to know if you have any of these conditions: -blood disorders -hearing problems -kidney disease -recent or ongoing radiation therapy -an unusual or allergic reaction to carboplatin, cisplatin, other chemotherapy, other medicines, foods, dyes, or preservatives -pregnant or trying to get pregnant -breast-feeding How should I use this medicine? This drug is usually given as an infusion into a vein. It is administered in a hospital or clinic by a specially trained health care professional. Talk to your pediatrician regarding the use of this medicine in children. Special care may be needed. Overdosage: If you think you have taken too much of this medicine contact a poison control center or emergency room at once. NOTE: This medicine is only for you. Do not share this medicine with others. What if I miss a dose? It is important not to miss a dose. Call your doctor or health care professional if you are unable to keep an appointment. What may interact with this medicine? -medicines for seizures -medicines to increase blood counts like filgrastim, pegfilgrastim, sargramostim -some antibiotics like amikacin, gentamicin, neomycin, streptomycin, tobramycin -vaccines Talk to your doctor or health care professional before taking any of these medicines: -acetaminophen -aspirin -ibuprofen -ketoprofen -naproxen This list may not describe all possible interactions. Give your health care provider a list of all the medicines, herbs,  non-prescription drugs, or dietary supplements you use. Also tell them if you smoke, drink alcohol, or use illegal drugs. Some items may interact with your medicine. What should I watch for while using this medicine? Your condition will be monitored carefully while you are receiving this medicine. You will need important blood work done while you are taking this medicine. This drug may make you feel generally unwell. This is not uncommon, as chemotherapy can affect healthy cells as well as cancer cells. Report any side effects. Continue your course of treatment even though you feel ill unless your doctor tells you to stop. In some cases, you may be given additional medicines to help with side effects. Follow all directions for their use. Call your doctor or health care professional for advice if you get a fever, chills or sore throat, or other symptoms of a cold or flu. Do not treat yourself. This drug decreases your body's ability to fight infections. Try to avoid being around people who are sick. This medicine may increase your risk to bruise or bleed. Call your doctor or health care professional if you notice any unusual bleeding. Be careful brushing and flossing your teeth or using a toothpick because you may get an infection or bleed more easily. If you have any dental work done, tell your dentist you are receiving this medicine. Avoid taking products that contain aspirin, acetaminophen, ibuprofen, naproxen, or ketoprofen unless instructed by your doctor. These medicines may hide a fever. Do not become pregnant while taking this medicine. Women should inform their doctor if they wish to become pregnant or think they might be pregnant. There is a potential for serious side effects to an unborn child. Talk to your health care professional or  pharmacist for more information. Do not breast-feed an infant while taking this medicine. What side effects may I notice from receiving this medicine? Side effects  that you should report to your doctor or health care professional as soon as possible: -allergic reactions like skin rash, itching or hives, swelling of the face, lips, or tongue -signs of infection - fever or chills, cough, sore throat, pain or difficulty passing urine -signs of decreased platelets or bleeding - bruising, pinpoint red spots on the skin, black, tarry stools, nosebleeds -signs of decreased red blood cells - unusually weak or tired, fainting spells, lightheadedness -breathing problems -changes in hearing -changes in vision -chest pain -high blood pressure -low blood counts - This drug may decrease the number of white blood cells, red blood cells and platelets. You may be at increased risk for infections and bleeding. -nausea and vomiting -pain, swelling, redness or irritation at the injection site -pain, tingling, numbness in the hands or feet -problems with balance, talking, walking -trouble passing urine or change in the amount of urine Side effects that usually do not require medical attention (report to your doctor or health care professional if they continue or are bothersome): -hair loss -loss of appetite -metallic taste in the mouth or changes in taste This list may not describe all possible side effects. Call your doctor for medical advice about side effects. You may report side effects to FDA at 1-800-FDA-1088. Where should I keep my medicine? This drug is given in a hospital or clinic and will not be stored at home. NOTE: This sheet is a summary. It may not cover all possible information. If you have questions about this medicine, talk to your doctor, pharmacist, or health care provider.  2018 [employer] Standard (2007-07-05 14:38:05)  Paclitaxel injection What is this medicine? PACLITAXEL (PAK li TAX el) is a chemotherapy drug. It targets fast dividing cells, like cancer cells, and causes these cells to die. This medicine is used to treat ovarian cancer,  breast cancer, and other cancers. This medicine may be used for other purposes; ask your health care provider or pharmacist if you have questions. COMMON BRAND NAME(S): Onxol, Taxol What should I tell my health care provider before I take this medicine? They need to know if you have any of these conditions: -blood disorders -irregular heartbeat -infection (especially a virus infection such as chickenpox, cold sores, or herpes) -liver disease -previous or ongoing radiation therapy -an unusual or allergic reaction to paclitaxel, alcohol, polyoxyethylated castor oil, other chemotherapy agents, other medicines, foods, dyes, or preservatives -pregnant or trying to get pregnant -breast-feeding How should I use this medicine? This drug is given as an infusion into a vein. It is administered in a hospital or clinic by a specially trained health care professional. Talk to your pediatrician regarding the use of this medicine in children. Special care may be needed. Overdosage: If you think you have taken too much of this medicine contact a poison control center or emergency room at once. NOTE: This medicine is only for you. Do not share this medicine with others. What if I miss a dose? It is important not to miss your dose. Call your doctor or health care professional if you are unable to keep an appointment. What may interact with this medicine? Do not take this medicine with any of the following medications: -disulfiram -metronidazole This medicine may also interact with the following medications: -cyclosporine -diazepam -ketoconazole -medicines to increase blood counts like filgrastim, pegfilgrastim, sargramostim -other chemotherapy drugs like  cisplatin, doxorubicin, epirubicin, etoposide, teniposide, vincristine -quinidine -testosterone -vaccines -verapamil Talk to your doctor or health care professional before taking any of these  medicines: -acetaminophen -aspirin -ibuprofen -ketoprofen -naproxen This list may not describe all possible interactions. Give your health care provider a list of all the medicines, herbs, non-prescription drugs, or dietary supplements you use. Also tell them if you smoke, drink alcohol, or use illegal drugs. Some items may interact with your medicine. What should I watch for while using this medicine? Your condition will be monitored carefully while you are receiving this medicine. You will need important blood work done while you are taking this medicine. This medicine can cause serious allergic reactions. To reduce your risk you will need to take other medicine(s) before treatment with this medicine. If you experience allergic reactions like skin rash, itching or hives, swelling of the face, lips, or tongue, tell your doctor or health care professional right away. In some cases, you may be given additional medicines to help with side effects. Follow all directions for their use. This drug may make you feel generally unwell. This is not uncommon, as chemotherapy can affect healthy cells as well as cancer cells. Report any side effects. Continue your course of treatment even though you feel ill unless your doctor tells you to stop. Call your doctor or health care professional for advice if you get a fever, chills or sore throat, or other symptoms of a cold or flu. Do not treat yourself. This drug decreases your body's ability to fight infections. Try to avoid being around people who are sick. This medicine may increase your risk to bruise or bleed. Call your doctor or health care professional if you notice any unusual bleeding. Be careful brushing and flossing your teeth or using a toothpick because you may get an infection or bleed more easily. If you have any dental work done, tell your dentist you are receiving this medicine. Avoid taking products that contain aspirin, acetaminophen, ibuprofen,  naproxen, or ketoprofen unless instructed by your doctor. These medicines may hide a fever. Do not become pregnant while taking this medicine. Women should inform their doctor if they wish to become pregnant or think they might be pregnant. There is a potential for serious side effects to an unborn child. Talk to your health care professional or pharmacist for more information. Do not breast-feed an infant while taking this medicine. Men are advised not to father a child while receiving this medicine. This product may contain alcohol. Ask your pharmacist or healthcare provider if this medicine contains alcohol. Be sure to tell all healthcare providers you are taking this medicine. Certain medicines, like metronidazole and disulfiram, can cause an unpleasant reaction when taken with alcohol. The reaction includes flushing, headache, nausea, vomiting, sweating, and increased thirst. The reaction can last from 30 minutes to several hours. What side effects may I notice from receiving this medicine? Side effects that you should report to your doctor or health care professional as soon as possible: -allergic reactions like skin rash, itching or hives, swelling of the face, lips, or tongue -low blood counts - This drug may decrease the number of white blood cells, red blood cells and platelets. You may be at increased risk for infections and bleeding. -signs of infection - fever or chills, cough, sore throat, pain or difficulty passing urine -signs of decreased platelets or bleeding - bruising, pinpoint red spots on the skin, black, tarry stools, nosebleeds -signs of decreased red blood cells - unusually weak  or tired, fainting spells, lightheadedness -breathing problems -chest pain -high or low blood pressure -mouth sores -nausea and vomiting -pain, swelling, redness or irritation at the injection site -pain, tingling, numbness in the hands or feet -slow or irregular heartbeat -swelling of the ankle,  feet, hands Side effects that usually do not require medical attention (report to your doctor or health care professional if they continue or are bothersome): -bone pain -complete hair loss including hair on your head, underarms, pubic hair, eyebrows, and eyelashes -changes in the color of fingernails -diarrhea -loosening of the fingernails -loss of appetite -muscle or joint pain -red flush to skin -sweating This list may not describe all possible side effects. Call your doctor for medical advice about side effects. You may report side effects to FDA at 1-800-FDA-1088. Where should I keep my medicine? This drug is given in a hospital or clinic and will not be stored at home. NOTE: This sheet is a summary. It may not cover all possible information. If you have questions about this medicine, talk to your doctor, pharmacist, or health care provider.  2018 [employer] (2015-01-29 19:58:00)

## 2017-06-17 NOTE — Progress Notes (Signed)
Radiation Oncology         [phone]) [phone] ________________________________  Name: Katelyn Kennedy        MRN: [id number]  Date of Service: 06/17/2017 DOB: 07-28-1945  XQ:JJHERDE, Mervyn Gay, MD  Curt Bears, MD     REFERRING PHYSICIAN: Curt Bears, MD   DIAGNOSIS: The encounter diagnosis was 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 ([place]).   HISTORY OF PRESENT ILLNESS: Katelyn Kennedy is a 72 y.o. female seen at the request of Dr. Julien Nordmann for newly diagnosed adenocarcinoma of the RLL. 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 Incidentally she had a mass in the RLL. She completed BCG in January 2019, and underwent PET on 05/26/17 revealing hypermetabolic changes in the 3.4 cm RLL mass, with subcarinal adenopathy. There was some question about ispilateral disease on PET but that there were no nodules seen that were to the resolution of PET due to size. She underwent bronchoscopy with biopsy on 06/08/17 which revealed a NSCLC, favor adenocarcinoma. A brain MRI on 06/09/17 was also negative. She will be followed for the concern of ipsilateral disease, and is being seen to discuss options of treating her as though she has stage III disease rather than stage IV.     PREVIOUS RADIATION THERAPY: No   PAST MEDICAL HISTORY:  Past Medical History:  Diagnosis Date  . Allergic rhinitis   . Arthritis   . Diabetes mellitus without complication (HCC)    diet control/no meds per pt  . Dyspnea   . History of chicken pox   . Smoker        PAST SURGICAL HISTORY: Past Surgical History:  Procedure Laterality Date  . ANKLE FRACTURE SURGERY Left 10 years ago   "hard to wake up from anesthesia" per pt  . [place]  . BREAST BIOPSY Right 1978   BENIGN CYST  . BREAST EXCISIONAL BIOPSY Left 2011   Benign  . BREAST EXCISIONAL BIOPSY Right 1985   Benign     . CATARACT EXTRACTION    . CATARACT EXTRACTION W/ INTRAOCULAR LENS IMPLANT Bilateral   . COLONOSCOPY  2011  . ELECTROMAGNETIC NAVIGATION BROCHOSCOPY N/A 06/08/2017   Procedure: ELECTROMAGNETIC NAVIGATION BRONCHOSCOPY;  Surgeon: Flora Lipps, MD;  Location: [facility] ORS;  Service: Cardiopulmonary;  Laterality: N/A;  . POLYPECTOMY  2011  . 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 TUBAL LIGATION       FAMILY HISTORY:  Family History  Problem Relation Age of Onset  . Diabetes Mother   . Cancer Sister 56       BREAST  . Breast cancer Sister   . Cancer Maternal Grandmother        COLON  . Colon cancer Maternal Grandmother 86  . Stomach cancer Neg Hx      SOCIAL HISTORY:  reports that she quit smoking about 7 weeks ago. Her smoking use included cigarettes. She has a 20.00 pack-year smoking history. she has never used smokeless tobacco. She reports that she drinks alcohol. She reports that she does not use drugs. The patient is divorced and lives in [place]. She is a retired [employer].   ALLERGIES: Codeine   MEDICATIONS:  Current Outpatient  Medications  Medication Sig Dispense Refill  . acetaminophen (TYLENOL) 500 MG tablet Take 500 mg by mouth daily as needed for moderate pain or headache.    Marland Kitchen atorvastatin (LIPITOR) 20 MG tablet TAKE 1 TABLET BY MOUTH DAILY (Patient taking differently: Take 20 mg by mouth daily) 90 tablet 3  . Calcium Carbonate-Vitamin D (CALCIUM + D) 600-200 MG-UNIT per tablet Take 1 tablet by mouth daily.     . fluticasone (FLONASE) 50 MCG/ACT nasal spray PLACE 2 SPRAYS INTO THE NOSE DAILY. (Patient taking differently: PLACE 2 SPRAYS INTO THE NOSE ONCE DAILY AS NEEDED FOR ALLERGIES) 16 g 5  . lisinopril (PRINIVIL,ZESTRIL) 5 MG tablet Take 0.5 tablets (2.5 mg total) by mouth  daily. 45 tablet 3  . Multiple Vitamin (MULTIVITAMIN) capsule Take 1 capsule by mouth daily.      . prochlorperazine (COMPAZINE) 10 MG tablet Take 1 tablet (10 mg total) by mouth every 6 (six) hours as needed for nausea or vomiting. 30 tablet 0  . temazepam (RESTORIL) 15 MG capsule Take 1 capsule (15 mg total) by mouth at bedtime as needed for sleep. 30 capsule 0  . Tetrahydrozoline HCl (VISINE OP) Place 1 drop into both eyes daily as needed (for dry eyes).     . traMADol (ULTRAM) 50 MG tablet Take 1 tablet (50 mg total) by mouth every 6 (six) hours as needed for moderate pain. 15 tablet 0   No current facility-administered medications for this encounter.      REVIEW OF SYSTEMS: On review of systems, the patient reports that she is doing well overall. She denies any hematuria. She does have some mild chest pain after coughing and reports she rarely has a productive cough. She reports occasional shortness of breath with exertion. She has not lost any weight, and denies any fevers, chills, or night sweats. She denies any bowel or bladder disturbances, and denies abdominal pain, nausea or vomiting. She denies any new musculoskeletal or joint aches or pains. A complete review of systems is obtained and is otherwise negative.     PHYSICAL EXAM:  Wt Readings from Last 3 Encounters:  06/17/17 128 lb 6.4 oz (58.2 kg)  06/17/17 128 lb 9.6 oz (58.3 kg)  06/09/17 127 lb 4.8 oz (57.7 kg)   Temp Readings from Last 3 Encounters:  06/17/17 98.5 F (36.9 C) (Oral)  06/17/17 98.4 F (36.9 C) (Oral)  06/09/17 97.7 F (36.5 C) (Oral)   BP Readings from Last 3 Encounters:  06/17/17 (!) 141/59  06/17/17 (!) 132/51  06/09/17 (!) 114/53   Pulse Readings from Last 3 Encounters:  06/17/17 70  06/17/17 77  06/09/17 85   Pain Assessment Pain Score: 0-No pain/10  In general this is a well appearing caucasian female in no acute distress. She is alert and oriented x4 and appropriate throughout the  examination. HEENT reveals that the patient is normocephalic, atraumatic. EOMs are intact. PERRLA. Skin is intact without any evidence of gross lesions. Cardiovascular exam reveals a regular rate and rhythm, no clicks rubs or murmurs are auscultated. Chest is clear to auscultation bilaterally. Lymphatic assessment is performed and does not reveal any adenopathy in the cervical, supraclavicular, axillary, or inguinal chains. Abdomen has active bowel sounds in all quadrants and is intact. The abdomen is soft, non tender, non distended. Lower extremities are negative for pretibial pitting edema, deep calf tenderness, cyanosis or clubbing.   ECOG = 1  0 - Asymptomatic (Fully active, able to carry on all predisease activities without  restriction)  1 - Symptomatic but completely ambulatory (Restricted in physically strenuous activity but ambulatory and able to carry out work of a light or sedentary nature. For example, light housework, office work)  2 - Symptomatic, <50% in bed during the day (Ambulatory and capable of all self care but unable to carry out any work activities. Up and about more than 50% of waking hours)  3 - Symptomatic, >50% in bed, but not bedbound (Capable of only limited self-care, confined to bed or chair 50% or more of waking hours)  4 - Bedbound (Completely disabled. Cannot carry on any self-care. Totally confined to bed or chair)  5 - Death   Eustace Pen MM, Creech RH, Tormey DC, et al. [phone]). "Toxicity and response criteria of the [facility]". [place] Oncol. 5 (6): 649-55    LABORATORY DATA:  Lab Results  Component Value Date   WBC 7.5 06/17/2017   HGB 15.0 03/16/2017   HCT 39.3 06/17/2017   MCV 96.8 06/17/2017   PLT 239 06/17/2017   Lab Results  Component Value Date   NA 141 06/17/2017   K 4.7 06/17/2017   CL 107 06/17/2017   CO2 26 06/17/2017   Lab Results  Component Value Date   ALT 16 06/17/2017   AST 17 06/17/2017   ALKPHOS 97  06/17/2017   BILITOT 0.4 06/17/2017      RADIOGRAPHY: Mr Jeri Cos SW Contrast  Result Date: 06/09/2017 CLINICAL DATA:  Recent diagnosis bladder cancer with pulmonary mass. Assess for metastatic disease. EXAM: MRI HEAD WITHOUT AND WITH CONTRAST TECHNIQUE: Multiplanar, multiecho pulse sequences of the brain and surrounding structures were obtained without and with intravenous contrast. CONTRAST:  57mL MULTIHANCE GADOBENATE DIMEGLUMINE 529 MG/ML IV SOLN COMPARISON:  None. FINDINGS: Brain: Brain has normal appearance without evidence of malformation, atrophy, old or acute small or large vessel infarction, mass lesion, hemorrhage, hydrocephalus or extra-axial collection. After contrast administration, no abnormal enhancement occurs. Vascular: Major vessels at the base of the brain show flow. Venous sinuses appear patent. Vertebral arteries are tortuous, indenting the pontomedullary junction region. Usually, this is not significant. Skull and upper cervical spine: Normal. Sinuses/Orbits: Clear/normal. Other: None significant. IMPRESSION: Normal examination.  No evidence of metastatic disease. Electronically Signed   By: Nelson Chimes M.D.   On: 06/09/2017 13:15   Nm Pet Image Initial (pi) Skull Base To Thigh  Result Date: 05/26/2017 CLINICAL DATA:  Initial treatment strategy for right lower lobe lung mass and pulmonary nodule detected on recent hematuria protocol CT study. Recent diagnosis bladder cancer status post TURBT 05/04/2017. EXAM: NUCLEAR MEDICINE PET SKULL BASE TO THIGH TECHNIQUE: 12.3 mCi F-18 FDG was injected intravenously. Full-ring PET imaging was performed from the skull base to thigh after the radiotracer. CT data was obtained and used for attenuation correction and anatomic localization. Mediastinal Blood Pool Activity (max SUV): 2.5 FASTING BLOOD GLUCOSE:  Value: 108 mg/dl COMPARISON:  04/22/2017 CT abdomen/pelvis. FINDINGS: NECK: No hypermetabolic lymph nodes in the neck. Partially calcified  subcentimeter bilateral thyroid nodules, non hypermetabolic. CHEST: Irregular hypermetabolic 3.4 x 1.9 cm right lower lobe lung mass (series 3/image 115) with max SUV 14.8, not appreciably changed in size since 04/22/2017 chest CT. Basilar right lower lobe 7 mm solid pulmonary nodule (series 3/image 128) with additional smaller scattered tiny pulmonary nodules in the right upper lobe, below PET resolution. No significant pulmonary nodules in the left lung. No acute consolidative airspace disease. No pneumothorax or pleural effusions. Mildly enlarged hypermetabolic 1.0  cm right subcarinal node with max SUV 8.8 (series 3/image 95). No hypermetabolic hilar nodes. No additional hypermetabolic mediastinal nodes. No hypermetabolic axillary nodes. Coronary atherosclerosis. Atherosclerotic nonaneurysmal thoracic aorta. ABDOMEN/PELVIS: No abnormal hypermetabolic activity within the liver, pancreas, adrenal glands, or spleen. No hypermetabolic lymph nodes in the abdomen or pelvis. Small hiatal hernia. Scattered moderate colonic diverticulosis, most prominent in the sigmoid colon. Atherosclerotic nonaneurysmal abdominal aorta. Bladder is collapsed and not well evaluated. SKELETON: No focal hypermetabolic activity to suggest skeletal metastasis. Marked degenerative changes in the lumbar spine with associated mild FDG uptake. IMPRESSION: 1. Irregular hypermetabolic 3.4 cm right lower lobe lung mass. Primary bronchogenic carcinoma is favored. 2. Hypermetabolic subcarinal adenopathy compatible with nodal metastasis. 3. Few solid pulmonary nodules in the right lung, largest 7 mm in the right lower lobe, below PET resolution, cannot exclude ipsilateral pulmonary metastases. Recommend attention on follow-up chest CT in 3 months. 4. No evidence of hypermetabolic metastatic disease in the abdomen, pelvis or skeleton. 5. Chronic findings include: Aortic Atherosclerosis (ICD10-I70.0). Coronary atherosclerosis. Small hiatal hernia.  Electronically Signed   By: Ilona Sorrel M.D.   On: 05/26/2017 10:06   Dg C-arm 1-60 Min-no Report  Result Date: 06/08/2017 Fluoroscopy was utilized by the requesting physician.  No radiographic interpretation.   Ct Super D Chest Wo Contrast  Result Date: 06/08/2017 CLINICAL DATA:  Hypermetabolic right lower lobe lung mass. Preoperative for endobronchial biopsy. History of bladder cancer status post TURBT 05/04/2017. EXAM: CT CHEST WITHOUT CONTRAST TECHNIQUE: Multidetector CT imaging of the chest was performed using thin slice collimation for electromagnetic bronchoscopy planning purposes, without intravenous contrast. COMPARISON:  05/26/2017 PET-CT. FINDINGS: Cardiovascular: Normal heart size. No significant pericardial fluid/thickening. Left main, left anterior descending and right coronary atherosclerosis. Atherosclerotic nonaneurysmal thoracic aorta. Normal caliber pulmonary arteries. Mediastinum/Nodes: No discrete thyroid nodules. Unremarkable esophagus. No axillary adenopathy. Stable mildly enlarged 1.0 cm subcarinal node (series 2/image 29). No additional pathologically enlarged mediastinal nodes. No discrete hilar adenopathy this noncontrast scan. Lungs/Pleura: No pneumothorax. No pleural effusion. Mild centrilobular emphysema. Persistent anterior right lower lobe 3.4 x 1.9 cm lung mass (series 3/image 40). Stable scattered solid pulmonary nodules in the right lung, largest 7 mm in the basilar right lower lobe (series 3/image 47). No new significant pulmonary nodules. No acute consolidative airspace disease. Upper abdomen: Two scattered subcentimeter hypodense liver lesions are too small to characterize and stable since 04/22/2017 CT. Musculoskeletal: No aggressive appearing focal osseous lesions. Moderate thoracic spondylosis. IMPRESSION: 1. Persistent solid 3.4 cm anterior right lower lobe lung mass, suspicious for primary bronchogenic carcinoma given hypermetabolism on 05/26/2017 PET-CT. 2. Stable  scattered small solid pulmonary nodules in the right lung, largest 0.7 cm in the basilar right lower lobe. Pulmonary metastases not excluded. Recommend attention on follow-up chest CT in 3-6 months. 3. Stable mild subcarinal lymphadenopathy, suspicious for nodal metastasis given hypermetabolism on 05/26/2017 PET-CT. 4. Left main and two-vessel coronary atherosclerosis. Aortic Atherosclerosis (ICD10-I70.0) and Emphysema (ICD10-J43.9). Electronically Signed   By: Ilona Sorrel M.D.   On: 06/08/2017 13:56       IMPRESSION/PLAN: 1. 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 Dr. Lisbeth Renshaw discusses the pathology findings and reviews the nature of concurrent chemoradiotherapy, and discusses the rationale for proceeding with treatment as though her disease is stage III.  We discussed the risks, benefits, short, and long term effects of radiotherapy, and the patient is interested in proceeding. Dr. Lisbeth Renshaw discusses the delivery and logistics of radiotherapy and anticipates a course of 6 1/2  weeks. Written consent is obtained and placed in the chart, a copy was provided to the patient. She will be contacted by our staff to simulate next week with the anticipation of starting treatment on 06/28/17. 2. 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  The patient will continue her follow-up with urology in [place], and is planning to undergo cystoscopy in May with Dr. Janese Banks.  We will continue to follow this expectantly  The above documentation reflects my direct findings during this shared patient visit. Please see the separate note by Dr. Lisbeth Renshaw on this date for the remainder of the patient's plan of care.    Carola Rhine, PAC

## 2017-06-17 NOTE — Telephone Encounter (Signed)
Appointments scheduled AVS/Calendar printed per 3/7 los °

## 2017-06-17 NOTE — Progress Notes (Signed)
[facility] OFFICE PROGRESS NOTE  Katelyn Sanders, MD [place] [address]  DIAGNOSIS: highly suspicious for stage IV (T2a, N2, M1a) non-small cell lung cancer, adenocarcinoma presenting with right lower lobe lung mass in addition to subcarinal lymphadenopathy and suspicious metastatic pulmonary nodules.  PRIOR THERAPY: None  CURRENT THERAPY: Concurrent chemoradiation with carboplatin for an AUC of 2 with paclitaxel 45 mg/m weekly via first dose expected 06/28/2017.  INTERVAL HISTORY: Katelyn Kennedy 72 y.o. female returns for routine follow-up visit by herself.  The patient is feeling fine today and has no specific complaints except for intermittent cough.  She denies fevers and chills.  Denies chest pain, shortness breath, hemoptysis.  Denies nausea, vomiting, constipation, diarrhea.  She has no recent weight loss or night sweats.  The patient had a recent biopsy and is here to discuss the results and treatment options.  MEDICAL HISTORY: Past Medical History:  Diagnosis Date  . Allergic rhinitis   . Arthritis   . Diabetes mellitus without complication (HCC)    diet control/no meds per pt  . Dyspnea   . History of chicken pox   . Smoker     ALLERGIES:  is allergic to codeine.  MEDICATIONS:  Current Outpatient Medications  Medication Sig Dispense Refill  . acetaminophen (TYLENOL) 500 MG tablet Take 500 mg by mouth daily as needed for moderate pain or headache.    Marland Kitchen atorvastatin (LIPITOR) 20 MG tablet TAKE 1 TABLET BY MOUTH DAILY (Patient taking differently: Take 20 mg by mouth daily) 90 tablet 3  . Calcium Carbonate-Vitamin D (CALCIUM + D) 600-200 MG-UNIT per tablet Take 1 tablet by mouth daily.     . fluticasone (FLONASE) 50 MCG/ACT nasal spray PLACE 2 SPRAYS INTO THE NOSE DAILY. (Patient taking differently: PLACE 2 SPRAYS INTO THE NOSE ONCE DAILY AS NEEDED FOR ALLERGIES) 16 g 5  . lisinopril (PRINIVIL,ZESTRIL) 5 MG tablet Take 0.5 tablets  (2.5 mg total) by mouth daily. 45 tablet 3  . Multiple Vitamin (MULTIVITAMIN) capsule Take 1 capsule by mouth daily.      . temazepam (RESTORIL) 15 MG capsule Take 1 capsule (15 mg total) by mouth at bedtime as needed for sleep. 30 capsule 0  . Tetrahydrozoline HCl (VISINE OP) Place 1 drop into both eyes daily as needed (for dry eyes).     . prochlorperazine (COMPAZINE) 10 MG tablet Take 1 tablet (10 mg total) by mouth every 6 (six) hours as needed for nausea or vomiting. 30 tablet 0  . traMADol (ULTRAM) 50 MG tablet Take 1 tablet (50 mg total) by mouth every 6 (six) hours as needed for moderate pain. (Patient not taking: Reported on 05/28/2017) 15 tablet 0   No current facility-administered medications for this visit.     SURGICAL HISTORY:  Past Surgical History:  Procedure Laterality Date  . ANKLE FRACTURE SURGERY Left 10 years ago   "hard to wake up from anesthesia" per pt  . [place]  . BREAST BIOPSY Right 1978   BENIGN CYST  . BREAST EXCISIONAL BIOPSY Left 2011   Benign  . BREAST EXCISIONAL BIOPSY Right 1985   Benign   . CATARACT EXTRACTION    . CATARACT EXTRACTION W/ INTRAOCULAR LENS IMPLANT Bilateral   . COLONOSCOPY  2011  . ELECTROMAGNETIC NAVIGATION BROCHOSCOPY N/A 06/08/2017   Procedure: ELECTROMAGNETIC NAVIGATION BRONCHOSCOPY;  Surgeon: Flora Lipps, MD;  Location: [facility] ORS;  Service: Cardiopulmonary;  Laterality: N/A;  . POLYPECTOMY  2011  . 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 TUBAL LIGATION      REVIEW OF SYSTEMS:   Review of Systems  Constitutional: Negative for appetite change, chills, fatigue, fever and unexpected weight change.  HENT:   Negative for mouth sores, nosebleeds, sore throat and trouble swallowing.   Eyes: Negative for eye problems and  icterus.  Respiratory: Negative for hemoptysis, shortness of breath and wheezing.  Positive for cough.  Cardiovascular: Negative for chest pain and leg swelling.  Gastrointestinal: Negative for abdominal pain, constipation, diarrhea, nausea and vomiting.  Genitourinary: Negative for bladder incontinence, difficulty urinating, dysuria, frequency and hematuria.   Musculoskeletal: Negative for back pain, gait problem, neck pain and neck stiffness.  Skin: Negative for itching and rash.  Neurological: Negative for dizziness, extremity weakness, gait problem, headaches, light-headedness and seizures.  Hematological: Negative for adenopathy. Does not bruise/bleed easily.  Psychiatric/Behavioral: Negative for confusion, depression and sleep disturbance. The patient is not nervous/anxious.     PHYSICAL EXAMINATION:  Blood pressure (!) 132/51, pulse 77, temperature 98.4 F (36.9 C), temperature source Oral, resp. rate 18, height 4' 11.84" (1.52 m), weight 128 lb 9.6 oz (58.3 kg), SpO2 99 %.  ECOG PERFORMANCE STATUS: 1 - Symptomatic but completely ambulatory  Physical Exam  Constitutional: Oriented to person, place, and time and well-developed, well-nourished, and in no distress. No distress.  HENT:  Head: Normocephalic and atraumatic.  Mouth/Throat: Oropharynx is clear and moist. No oropharyngeal exudate.  Eyes: Conjunctivae are normal. Right eye exhibits no discharge. Left eye exhibits no discharge. No scleral icterus.  Neck: Normal range of motion. Neck supple.  Cardiovascular: Normal rate, regular rhythm, normal heart sounds and intact distal pulses.   Pulmonary/Chest: Effort normal and breath sounds normal. No respiratory distress. No wheezes. No rales.  Abdominal: Soft. Bowel sounds are normal. Exhibits no distension and no mass. There is no tenderness.  Musculoskeletal: Normal range of motion. Exhibits no edema.  Lymphadenopathy:    No cervical adenopathy.  Neurological: Alert and oriented  to person, place, and time. Exhibits normal muscle tone. Gait normal. Coordination normal.  Skin: Skin is warm and dry. No rash noted. Not diaphoretic. No erythema. No pallor.  Psychiatric: Mood, memory and judgment normal.  Vitals reviewed.  LABORATORY DATA: Lab Results  Component Value Date   WBC 7.5 06/17/2017   HGB 15.0 03/16/2017   HCT 39.3 06/17/2017   MCV 96.8 06/17/2017   PLT 239 06/17/2017      Chemistry      Component Value Date/Time   NA 137 03/16/2017 1614   K 4.4 03/16/2017 1614   CL 101 03/16/2017 1614   CO2 27 03/16/2017 1614   BUN 18 03/16/2017 1614   CREATININE 0.70 06/09/2017 1101      Component Value Date/Time   CALCIUM 10.4 03/16/2017 1614   ALKPHOS 65 02/02/2017 1219   AST 23 02/02/2017 1219   ALT 21 02/02/2017 1219   BILITOT 0.5 02/02/2017 1219       RADIOGRAPHIC STUDIES:  Mr Jeri Cos MB Contrast  Result Date: 06/09/2017 CLINICAL DATA:  Recent diagnosis bladder cancer with pulmonary mass. Assess for metastatic disease. EXAM: MRI HEAD WITHOUT AND WITH CONTRAST TECHNIQUE: Multiplanar, multiecho pulse sequences of the brain and surrounding structures were obtained without and with intravenous contrast. CONTRAST:  58mL MULTIHANCE GADOBENATE DIMEGLUMINE  529 MG/ML IV SOLN COMPARISON:  None. FINDINGS: Brain: Brain has normal appearance without evidence of malformation, atrophy, old or acute small or large vessel infarction, mass lesion, hemorrhage, hydrocephalus or extra-axial collection. After contrast administration, no abnormal enhancement occurs. Vascular: Major vessels at the base of the brain show flow. Venous sinuses appear patent. Vertebral arteries are tortuous, indenting the pontomedullary junction region. Usually, this is not significant. Skull and upper cervical spine: Normal. Sinuses/Orbits: Clear/normal. Other: None significant. IMPRESSION: Normal examination.  No evidence of metastatic disease. Electronically Signed   By: Nelson Chimes M.D.   On:  06/09/2017 13:15   Nm Pet Image Initial (pi) Skull Base To Thigh  Result Date: 05/26/2017 CLINICAL DATA:  Initial treatment strategy for right lower lobe lung mass and pulmonary nodule detected on recent hematuria protocol CT study. Recent diagnosis bladder cancer status post TURBT 05/04/2017. EXAM: NUCLEAR MEDICINE PET SKULL BASE TO THIGH TECHNIQUE: 12.3 mCi F-18 FDG was injected intravenously. Full-ring PET imaging was performed from the skull base to thigh after the radiotracer. CT data was obtained and used for attenuation correction and anatomic localization. Mediastinal Blood Pool Activity (max SUV): 2.5 FASTING BLOOD GLUCOSE:  Value: 108 mg/dl COMPARISON:  04/22/2017 CT abdomen/pelvis. FINDINGS: NECK: No hypermetabolic lymph nodes in the neck. Partially calcified subcentimeter bilateral thyroid nodules, non hypermetabolic. CHEST: Irregular hypermetabolic 3.4 x 1.9 cm right lower lobe lung mass (series 3/image 115) with max SUV 14.8, not appreciably changed in size since 04/22/2017 chest CT. Basilar right lower lobe 7 mm solid pulmonary nodule (series 3/image 128) with additional smaller scattered tiny pulmonary nodules in the right upper lobe, below PET resolution. No significant pulmonary nodules in the left lung. No acute consolidative airspace disease. No pneumothorax or pleural effusions. Mildly enlarged hypermetabolic 1.0 cm right subcarinal node with max SUV 8.8 (series 3/image 95). No hypermetabolic hilar nodes. No additional hypermetabolic mediastinal nodes. No hypermetabolic axillary nodes. Coronary atherosclerosis. Atherosclerotic nonaneurysmal thoracic aorta. ABDOMEN/PELVIS: No abnormal hypermetabolic activity within the liver, pancreas, adrenal glands, or spleen. No hypermetabolic lymph nodes in the abdomen or pelvis. Small hiatal hernia. Scattered moderate colonic diverticulosis, most prominent in the sigmoid colon. Atherosclerotic nonaneurysmal abdominal aorta. Bladder is collapsed and not  well evaluated. SKELETON: No focal hypermetabolic activity to suggest skeletal metastasis. Marked degenerative changes in the lumbar spine with associated mild FDG uptake. IMPRESSION: 1. Irregular hypermetabolic 3.4 cm right lower lobe lung mass. Primary bronchogenic carcinoma is favored. 2. Hypermetabolic subcarinal adenopathy compatible with nodal metastasis. 3. Few solid pulmonary nodules in the right lung, largest 7 mm in the right lower lobe, below PET resolution, cannot exclude ipsilateral pulmonary metastases. Recommend attention on follow-up chest CT in 3 months. 4. No evidence of hypermetabolic metastatic disease in the abdomen, pelvis or skeleton. 5. Chronic findings include: Aortic Atherosclerosis (ICD10-I70.0). Coronary atherosclerosis. Small hiatal hernia. Electronically Signed   By: Ilona Sorrel M.D.   On: 05/26/2017 10:06   Dg C-arm 1-60 Min-no Report  Result Date: 06/08/2017 Fluoroscopy was utilized by the requesting physician.  No radiographic interpretation.   Ct Super D Chest Wo Contrast  Result Date: 06/08/2017 CLINICAL DATA:  Hypermetabolic right lower lobe lung mass. Preoperative for endobronchial biopsy. History of bladder cancer status post TURBT 05/04/2017. EXAM: CT CHEST WITHOUT CONTRAST TECHNIQUE: Multidetector CT imaging of the chest was performed using thin slice collimation for electromagnetic bronchoscopy planning purposes, without intravenous contrast. COMPARISON:  05/26/2017 PET-CT. FINDINGS: Cardiovascular: Normal heart size. No significant pericardial fluid/thickening. Left main, left anterior descending and right  coronary atherosclerosis. Atherosclerotic nonaneurysmal thoracic aorta. Normal caliber pulmonary arteries. Mediastinum/Nodes: No discrete thyroid nodules. Unremarkable esophagus. No axillary adenopathy. Stable mildly enlarged 1.0 cm subcarinal node (series 2/image 29). No additional pathologically enlarged mediastinal nodes. No discrete hilar adenopathy this  noncontrast scan. Lungs/Pleura: No pneumothorax. No pleural effusion. Mild centrilobular emphysema. Persistent anterior right lower lobe 3.4 x 1.9 cm lung mass (series 3/image 40). Stable scattered solid pulmonary nodules in the right lung, largest 7 mm in the basilar right lower lobe (series 3/image 47). No new significant pulmonary nodules. No acute consolidative airspace disease. Upper abdomen: Two scattered subcentimeter hypodense liver lesions are too small to characterize and stable since 04/22/2017 CT. Musculoskeletal: No aggressive appearing focal osseous lesions. Moderate thoracic spondylosis. IMPRESSION: 1. Persistent solid 3.4 cm anterior right lower lobe lung mass, suspicious for primary bronchogenic carcinoma given hypermetabolism on 05/26/2017 PET-CT. 2. Stable scattered small solid pulmonary nodules in the right lung, largest 0.7 cm in the basilar right lower lobe. Pulmonary metastases not excluded. Recommend attention on follow-up chest CT in 3-6 months. 3. Stable mild subcarinal lymphadenopathy, suspicious for nodal metastasis given hypermetabolism on 05/26/2017 PET-CT. 4. Left main and two-vessel coronary atherosclerosis. Aortic Atherosclerosis (ICD10-I70.0) and Emphysema (ICD10-J43.9). Electronically Signed   By: Ilona Sorrel M.D.   On: 06/08/2017 13:56     ASSESSMENT/PLAN:  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 ([place]) This is a very pleasant 72 year old white female with highly suspicious for stage IV (T2a, N2, M1a) non-small cell lung cancer, adenocarcinoma presenting with a right lower lobe lung mass in addition to subcarinal lymphadenopathy and suspicious metastatic pulmonary nodules. The patient had a recent biopsy and is here to discuss the results and treatment options.  The patient was seen with Dr. Julien Nordmann.  Biopsy results were discussed with the patient which shows that she has non-small cell lung cancer consistent with adenocarcinoma.  Unfortunately, there was  not enough tissue to send for molecular testing for PDL 1 testing.  We discussed proceeding with [facility] testing.  The patient will return to the lab later today for this test. We again discussed staging of her disease with her today.  We discussed that she is at least a stage IIIa, but there are several other small pulmonary nodules which are suspicious for lung cancer as well and she may have stage IV disease. Recommend for the patient to undergo a course of concurrent chemoradiation to the known right lower lobe lung mass and subcarinal lymphadenopathy with close monitoring of the other pulmonary nodules.  The patient will see radiation later today. She will receive chemotherapy with carboplatin for an AUC of 2 and paclitaxel 45 mg/m given weekly with her radiation. Discussed with the patient adverse effects of this treatment including but not limited to alopecia, myelosuppression, nausea and vomiting, peripheral neuropathy, liver or renal dysfunction.  The patient would like to proceed with this treatment.  Anticipate first dose on 06/28/2017. The patient will attend a chemotherapy education class. A prescription for Compazine 10 mg every 6 hours as needed for nausea and vomiting was sent to her pharmacy. Patient will follow up on 06/28/2017 for labs and her first cycle of chemotherapy.  She will have a follow-up visit evaluation prior to cycle #2.  Insomnia, she will continue Restoril 15 mg at bedtime as needed.  She was advised to call immediately if she has any concerning symptoms in the interval. The patient voices understanding of current disease status and treatment options and is  in agreement with the current care plan.  All questions were answered. The patient knows to call the clinic with any problems, questions or concerns. We can certainly see the patient much sooner if necessary.  Orders Placed This Encounter  Procedures  . [facility]ant 360    Standing Status:   Future    Standing  Expiration Date:   06/17/2018  . CBC with Differential (Cancer Center Only)    Standing Status:   Standing    Number of Occurrences:   20    Standing Expiration Date:   06/18/2018  . CMP ([place] only)    Standing Status:   Standing    Number of Occurrences:   20    Standing Expiration Date:   06/18/2018   Mikey Bussing, [place], AGPCNP-BC, AOCNP 06/17/17   ADDENDUM: Hematology/Oncology Attending: I had a face-to-face encounter with the patient today.  I recommended her care plan.  This is a very pleasant 72 years old white female recently diagnosed with at least stage IIIa non-small cell lung cancer favoring adenocarcinoma presented with right lower lobe lung mass as well as subcarinal lymphadenopathy.  The patient also has suspicious metastatic pulmonary nodules. I had a lengthy discussion with the patient today about her current condition and treatment options.  Her brain MRI showed no evidence of metastatic disease to the brain. I recommended for the patient a course of concurrent chemoradiation with weekly carboplatin for AUC of 2 and paclitaxel 45 mg/M2.  I discussed with the patient the adverse effect of this treatment including but not limited to alopecia, myelosuppression, nausea and vomiting, peripheral neuropathy, liver or renal dysfunction. She is expected to start the first cycle of this treatment on June 28, 2017.  We will continue to monitor the other pulmonary nodule closely on the upcoming imaging studies. For the molecular studies, there was insufficient material from the biopsy.  We will send a blood sample to [facility]. The patient will have a chemotherapy education class before starting the first dose of her treatment. We will see her back for follow-up visit in 2-3 weeks for evaluation and management of any adverse effect of her treatment. She was advised to call immediately if she has any concerning symptoms in the interval.  Disclaimer: This note was dictated with  voice recognition software. Similar sounding words can inadvertently be transcribed and may be missed upon review. Eilleen Kempf, MD 06/17/17

## 2017-06-17 NOTE — Assessment & Plan Note (Signed)
This is a very pleasant 72 year old white female with highly suspicious for stage IV (T2a, N2, M1a) non-small cell lung cancer, adenocarcinoma presenting with a right lower lobe lung mass in addition to subcarinal lymphadenopathy and suspicious metastatic pulmonary nodules. The patient had a recent biopsy and is here to discuss the results and treatment options.  The patient was seen with Dr. Julien Nordmann.  Biopsy results were discussed with the patient which shows that she has non-small cell lung cancer consistent with adenocarcinoma.  Unfortunately, there was not enough tissue to send for molecular testing for PDL 1 testing.  We discussed proceeding with Guardant 360 testing.  The patient will return to the lab later today for this test. We again discussed staging of her disease with her today.  We discussed that she is at least a stage IIIa, but there are several other small pulmonary nodules which are suspicious for lung cancer as well and she may have stage IV disease. Recommend for the patient to undergo a course of concurrent chemoradiation to the known right lower lobe lung mass and subcarinal lymphadenopathy with close monitoring of the other pulmonary nodules.  The patient will see radiation later today. She will receive chemotherapy with carboplatin for an AUC of 2 and paclitaxel 45 mg/m given weekly with her radiation. Discussed with the patient adverse effects of this treatment including but not limited to alopecia, myelosuppression, nausea and vomiting, peripheral neuropathy, liver or renal dysfunction.  The patient would like to proceed with this treatment.  Anticipate first dose on 06/28/2017. The patient will attend a chemotherapy education class. A prescription for Compazine 10 mg every 6 hours as needed for nausea and vomiting was sent to her pharmacy. Patient will follow up on 06/28/2017 for labs and her first cycle of chemotherapy.  She will have a follow-up visit evaluation prior to cycle  #2.  Insomnia, she will continue Restoril 15 mg at bedtime as needed.  She was advised to call immediately if she has any concerning symptoms in the interval. The patient voices understanding of current disease status and treatment options and is in agreement with the current care plan.  All questions were answered. The patient knows to call the clinic with any problems, questions or concerns. We can certainly see the patient much sooner if necessary.

## 2017-06-17 NOTE — Progress Notes (Signed)
START ON PATHWAY REGIMEN - Non-Small Cell Lung     Administer weekly:     Paclitaxel      Carboplatin   **Always confirm dose/schedule in your pharmacy ordering system**    Patient Characteristics: Stage III - Unresectable, PS = 0, 1 AJCC T Category: T2a Current Disease Status: No Distant Mets or Local Recurrence AJCC N Category: N2 AJCC M Category: M0 AJCC 8 Stage Grouping: IIIA Performance Status: PS = 0, 1 Intent of Therapy: Curative Intent, Discussed with Patient

## 2017-06-17 NOTE — Progress Notes (Signed)
Oncology Nurse Navigator Documentation  Oncology Nurse Navigator Flowsheets 06/17/2017  Navigator Location [facility]  Navigator Encounter Type Other/Help complete [facility] information and took to the lab  Treatment Phase Pre-Tx/Tx Discussion  Barriers/Navigation Needs Coordination of Care  Interventions Coordination of Care  Coordination of Care Other  Acuity Level 2  Time Spent with Patient 15

## 2017-06-22 ENCOUNTER — Ambulatory Visit
Admission: RE | Admit: 2017-06-22 | Discharge: 2017-06-22 | Disposition: A | Discharge status: Home / Self Care | Payer: PPO | Source: Ambulatory Visit | Attending: Radiation Oncology | Admitting: Radiation Oncology

## 2017-06-22 DIAGNOSIS — Z510 Encounter for antineoplastic radiation therapy: Secondary | ICD-10-CM | POA: Insufficient documentation

## 2017-06-22 DIAGNOSIS — C3431 Malignant neoplasm of lower lobe, right bronchus or lung: Secondary | ICD-10-CM

## 2017-06-24 DIAGNOSIS — Z510 Encounter for antineoplastic radiation therapy: Secondary | ICD-10-CM | POA: Diagnosis not present

## 2017-06-24 DIAGNOSIS — C3431 Malignant neoplasm of lower lobe, right bronchus or lung: Secondary | ICD-10-CM | POA: Diagnosis not present

## 2017-06-24 NOTE — Progress Notes (Signed)
  Radiation Oncology         [id number]) [phone] ________________________________  Name: Katelyn Kennedy MRN: [id number]  Date: 06/22/2017  DOB: [birth year]  RESPIRATORY MOTION MANAGEMENT SIMULATION  NARRATIVE:  In order to account for effect of respiratory motion on target structures and other organs in the planning and delivery of radiotherapy, this patient underwent respiratory motion management simulation.  To accomplish this, when the patient was brought to the CT simulation planning suite, 4D respiratoy motion management CT images were obtained.  The CT images were loaded into the planning software.  Then, using a variety of tools including Cine, MIP, and standard views, the target volume and planning target volumes (PTV) were delineated.  Avoidance structures were contoured.  Treatment planning then occurred.  Dose volume histograms were generated and reviewed for each of the requested structure.  The resulting plan was carefully reviewed and approved today.   ------------------------------------------------  Jodelle Gross, MD, PhD

## 2017-06-24 NOTE — Progress Notes (Signed)
  Radiation Oncology         [phone] ________________________________  Name: Katelyn Kennedy MRN: [id number]  Date: 06/22/2017  DOB: 06-01-1945  SIMULATION AND TREATMENT PLANNING NOTE  DIAGNOSIS:     ICD-10-CM   1. 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  Identity was confirmed.  All relevant records and images related to the planned course of therapy were reviewed.   Written consent to proceed with treatment was confirmed which was freely given after reviewing the details related to the planned course of therapy had been reviewed with the patient.  Then, the patient was set-up in a stable reproducible  supine position for radiation therapy.  CT images were obtained.  Surface markings were placed.    Medically necessary complex treatment device(s) for immobilization:  Vac-lock bag.   The CT images were loaded into the planning software.  Then the target and avoidance structures were contoured.  Treatment planning then occurred.  The radiation prescription was entered and confirmed.  A total of 4 complex treatment devices were fabricated which relate to the designed radiation treatment fields. Additional reduced fields will be used as necessary to improve the dose homogeneity of the plan. Each of these customized fields/ complex treatment devices will be used on a daily basis during the radiation course. I have requested : 3D Simulation  I have requested a DVH of the following structures: target volume, spinal cord, lungs, heart.   The patient will undergo daily image guidance to ensure accurate localization of the target, and adequate minimize dose to the normal surrounding structures in close proximity to the target.  PLAN:  The patient will receive 60 Gy in 30 fractions initially. The patient will then receive a 6 Gy boost for a final dose of 66 Gy.  Special  treatment procedure The patient will also receive concurrent chemotherapy during the treatment. The patient may therefore experience increased toxicity or side effects and the patient will be monitored for such problems. This may require extra lab work as necessary. This therefore constitutes a special treatment procedure.   ________________________________   Jodelle Gross, MD, PhD

## 2017-06-25 ENCOUNTER — Inpatient Hospital Stay: Payer: PPO

## 2017-06-28 ENCOUNTER — Ambulatory Visit
Admission: RE | Admit: 2017-06-28 | Discharge: 2017-06-28 | Disposition: A | Discharge status: Home / Self Care | Payer: PPO | Source: Ambulatory Visit | Attending: Radiation Oncology | Admitting: Radiation Oncology

## 2017-06-28 ENCOUNTER — Inpatient Hospital Stay: Payer: PPO

## 2017-06-28 ENCOUNTER — Ambulatory Visit (HOSPITAL_BASED_OUTPATIENT_CLINIC_OR_DEPARTMENT_OTHER): Payer: PPO | Admitting: Medical

## 2017-06-28 VITALS — BP 133/64 | HR 72 | Temp 98.0°F | Resp 16

## 2017-06-28 DIAGNOSIS — R0602 Shortness of breath: Secondary | ICD-10-CM

## 2017-06-28 DIAGNOSIS — Z510 Encounter for antineoplastic radiation therapy: Secondary | ICD-10-CM | POA: Diagnosis not present

## 2017-06-28 DIAGNOSIS — Z5111 Encounter for antineoplastic chemotherapy: Secondary | ICD-10-CM | POA: Diagnosis not present

## 2017-06-28 DIAGNOSIS — E119 Type 2 diabetes mellitus without complications: Secondary | ICD-10-CM | POA: Diagnosis not present

## 2017-06-28 DIAGNOSIS — Z79899 Other long term (current) drug therapy: Secondary | ICD-10-CM

## 2017-06-28 DIAGNOSIS — I2510 Atherosclerotic heart disease of native coronary artery without angina pectoris: Secondary | ICD-10-CM | POA: Diagnosis not present

## 2017-06-28 DIAGNOSIS — R599 Enlarged lymph nodes, unspecified: Secondary | ICD-10-CM

## 2017-06-28 DIAGNOSIS — J439 Emphysema, unspecified: Secondary | ICD-10-CM | POA: Diagnosis not present

## 2017-06-28 DIAGNOSIS — F17210 Nicotine dependence, cigarettes, uncomplicated: Secondary | ICD-10-CM

## 2017-06-28 DIAGNOSIS — G4700 Insomnia, unspecified: Secondary | ICD-10-CM | POA: Diagnosis not present

## 2017-06-28 DIAGNOSIS — R918 Other nonspecific abnormal finding of lung field: Secondary | ICD-10-CM

## 2017-06-28 DIAGNOSIS — C3431 Malignant neoplasm of lower lobe, right bronchus or lung: Secondary | ICD-10-CM | POA: Diagnosis not present

## 2017-06-28 DIAGNOSIS — I700 Atherosclerosis of aorta: Secondary | ICD-10-CM | POA: Diagnosis not present

## 2017-06-28 LAB — CBC WITH DIFFERENTIAL (CANCER CENTER ONLY)
Basophils Absolute: 0 10*3/uL (ref 0.0–0.1)
Basophils Relative: 1 %
Eosinophils Absolute: 0.1 10*3/uL (ref 0.0–0.5)
Eosinophils Relative: 2 %
HCT: 39.3 % (ref 34.8–46.6)
Hemoglobin: 13.1 g/dL (ref 11.6–15.9)
Lymphocytes Relative: 27 %
Lymphs Abs: 1.6 10*3/uL (ref 0.9–3.3)
MCH: 31.9 pg (ref 25.1–34.0)
MCHC: 33.4 g/dL (ref 31.5–36.0)
MCV: 95.2 fL (ref 79.5–101.0)
Monocytes Absolute: 0.6 10*3/uL (ref 0.1–0.9)
Monocytes Relative: 10 %
Neutro Abs: 3.6 10*3/uL (ref 1.5–6.5)
Neutrophils Relative %: 60 %
Platelet Count: 234 10*3/uL (ref 145–400)
RBC: 4.13 MIL/uL (ref 3.70–5.45)
RDW: 13.1 % (ref 11.2–14.5)
WBC Count: 6 10*3/uL (ref 3.9–10.3)

## 2017-06-28 LAB — CMP (CANCER CENTER ONLY)
ALT: 15 U/L (ref 0–55)
AST: 16 U/L (ref 5–34)
Albumin: 3.8 g/dL (ref 3.5–5.0)
Alkaline Phosphatase: 76 U/L (ref 40–150)
Anion gap: 10 (ref 3–11)
BUN: 16 mg/dL (ref 7–26)
CO2: 23 mmol/L (ref 22–29)
Calcium: 10.2 mg/dL (ref 8.4–10.4)
Chloride: 107 mmol/L (ref 98–109)
Creatinine: 0.82 mg/dL (ref 0.60–1.10)
GFR, Est AFR Am: >60 mL/min (ref >60)
GFR, Estimated: >60 mL/min (ref >60)
Glucose, Bld: 124 mg/dL (ref 70–140)
Potassium: 4.3 mmol/L (ref 3.5–5.1)
Sodium: 140 mmol/L (ref 136–145)
Total Bilirubin: 0.4 mg/dL (ref 0.2–1.2)
Total Protein: 6.7 g/dL (ref 6.4–8.3)

## 2017-06-28 MED ORDER — SODIUM CHLORIDE 0.9 % IV SOLN
Freq: Once | INTRAVENOUS | Status: AC
  Administered 2017-06-28: 08:00:00 via INTRAVENOUS

## 2017-06-28 MED ORDER — PALONOSETRON HCL INJECTION 0.25 MG/5ML
0.2500 mg | Freq: Once | INTRAVENOUS | Status: AC
  Administered 2017-06-28: 0.25 mg via INTRAVENOUS

## 2017-06-28 MED ORDER — DIPHENHYDRAMINE HCL 50 MG/ML IJ SOLN
INTRAMUSCULAR | Status: AC
  Filled 2017-06-28: qty 1

## 2017-06-28 MED ORDER — CARBOPLATIN CHEMO INJECTION 450 MG/45ML
142.2000 mg | Freq: Once | INTRAVENOUS | Status: AC
  Administered 2017-06-28: 140 mg via INTRAVENOUS
  Filled 2017-06-28: qty 14

## 2017-06-28 MED ORDER — SODIUM CHLORIDE 0.9 % IV SOLN
20.0000 mg | Freq: Once | INTRAVENOUS | Status: AC
  Administered 2017-06-28: 20 mg via INTRAVENOUS
  Filled 2017-06-28: qty 2

## 2017-06-28 MED ORDER — SODIUM CHLORIDE 0.9 % IV SOLN
45.0000 mg/m2 | Freq: Once | INTRAVENOUS | Status: AC
  Administered 2017-06-28: 72 mg via INTRAVENOUS
  Filled 2017-06-28: qty 12

## 2017-06-28 MED ORDER — PALONOSETRON HCL INJECTION 0.25 MG/5ML
INTRAVENOUS | Status: AC
  Filled 2017-06-28: qty 5

## 2017-06-28 MED ORDER — FAMOTIDINE IN NACL 20-0.9 MG/50ML-% IV SOLN: 20.0000 mg | Freq: Once | INTRAVENOUS | Status: DC

## 2017-06-28 MED ORDER — DIPHENHYDRAMINE HCL 50 MG/ML IJ SOLN
50.0000 mg | Freq: Once | INTRAMUSCULAR | Status: AC
  Administered 2017-06-28: 50 mg via INTRAVENOUS

## 2017-06-28 NOTE — Progress Notes (Signed)
Patient c/o back pain during Taxol infusion.  Taxol paused, NS ran per hypersensitivity protocol.  VS obtained per flow sheet.  Sandi Mealy, PA-C assessed patient in infusion room.  Back pain related to muscular discomfort vs Taxol.  Taxol resumed, patient  tolerated well.

## 2017-06-28 NOTE — Patient Instructions (Signed)
[facility] Discharge Instructions for Patients Receiving Chemotherapy  Today you received the following chemotherapy agents Taxol and Carboplatin.   To help prevent nausea and vomiting after your treatment, we encourage you to take your nausea medication as directed.   If you develop nausea and vomiting that is not controlled by your nausea medication, call the clinic.   BELOW ARE SYMPTOMS THAT SHOULD BE REPORTED IMMEDIATELY:  *FEVER GREATER THAN 100.5 F  *CHILLS WITH OR WITHOUT FEVER  NAUSEA AND VOMITING THAT IS NOT CONTROLLED WITH YOUR NAUSEA MEDICATION  *UNUSUAL SHORTNESS OF BREATH  *UNUSUAL BRUISING OR BLEEDING  TENDERNESS IN MOUTH AND THROAT WITH OR WITHOUT PRESENCE OF ULCERS  *URINARY PROBLEMS  *BOWEL PROBLEMS  UNUSUAL RASH Items with * indicate a potential emergency and should be followed up as soon as possible.  Feel free to call the clinic should you have any questions or concerns. The clinic phone number is [phone].  Please show the [facility] at check-in to the Emergency Department and triage nurse.   Paclitaxel injection What is this medicine? PACLITAXEL (PAK li TAX el) is a chemotherapy drug. It targets fast dividing cells, like cancer cells, and causes these cells to die. This medicine is used to treat ovarian cancer, breast cancer, and other cancers. This medicine may be used for other purposes; ask your health care provider or pharmacist if you have questions. COMMON BRAND NAME(S): Onxol, Taxol What should I tell my health care provider before I take this medicine? They need to know if you have any of these conditions: -blood disorders -irregular heartbeat -infection (especially a virus infection such as chickenpox, cold sores, or herpes) -liver disease -previous or ongoing radiation therapy -an unusual or allergic reaction to paclitaxel, alcohol, polyoxyethylated castor oil, other chemotherapy agents, other medicines, foods,  dyes, or preservatives -pregnant or trying to get pregnant -breast-feeding How should I use this medicine? This drug is given as an infusion into a vein. It is administered in a hospital or clinic by a specially trained health care professional. Talk to your pediatrician regarding the use of this medicine in children. Special care may be needed. Overdosage: If you think you have taken too much of this medicine contact a poison control center or emergency room at once. NOTE: This medicine is only for you. Do not share this medicine with others. What if I miss a dose? It is important not to miss your dose. Call your doctor or health care professional if you are unable to keep an appointment. What may interact with this medicine? Do not take this medicine with any of the following medications: -disulfiram -metronidazole This medicine may also interact with the following medications: -cyclosporine -diazepam -ketoconazole -medicines to increase blood counts like filgrastim, pegfilgrastim, sargramostim -other chemotherapy drugs like cisplatin, doxorubicin, epirubicin, etoposide, teniposide, vincristine -quinidine -testosterone -vaccines -verapamil Talk to your doctor or health care professional before taking any of these medicines: -acetaminophen -aspirin -ibuprofen -ketoprofen -naproxen This list may not describe all possible interactions. Give your health care provider a list of all the medicines, herbs, non-prescription drugs, or dietary supplements you use. Also tell them if you smoke, drink alcohol, or use illegal drugs. Some items may interact with your medicine. What should I watch for while using this medicine? Your condition will be monitored carefully while you are receiving this medicine. You will need important blood work done while you are taking this medicine. This medicine can cause serious allergic reactions. To reduce your risk you  will need to take other medicine(s)  before treatment with this medicine. If you experience allergic reactions like skin rash, itching or hives, swelling of the face, lips, or tongue, tell your doctor or health care professional right away. In some cases, you may be given additional medicines to help with side effects. Follow all directions for their use. This drug may make you feel generally unwell. This is not uncommon, as chemotherapy can affect healthy cells as well as cancer cells. Report any side effects. Continue your course of treatment even though you feel ill unless your doctor tells you to stop. Call your doctor or health care professional for advice if you get a fever, chills or sore throat, or other symptoms of a cold or flu. Do not treat yourself. This drug decreases your body's ability to fight infections. Try to avoid being around people who are sick. This medicine may increase your risk to bruise or bleed. Call your doctor or health care professional if you notice any unusual bleeding. Be careful brushing and flossing your teeth or using a toothpick because you may get an infection or bleed more easily. If you have any dental work done, tell your dentist you are receiving this medicine. Avoid taking products that contain aspirin, acetaminophen, ibuprofen, naproxen, or ketoprofen unless instructed by your doctor. These medicines may hide a fever. Do not become pregnant while taking this medicine. Women should inform their doctor if they wish to become pregnant or think they might be pregnant. There is a potential for serious side effects to an unborn child. Talk to your health care professional or pharmacist for more information. Do not breast-feed an infant while taking this medicine. Men are advised not to father a child while receiving this medicine. This product may contain alcohol. Ask your pharmacist or healthcare provider if this medicine contains alcohol. Be sure to tell all healthcare providers you are taking this  medicine. Certain medicines, like metronidazole and disulfiram, can cause an unpleasant reaction when taken with alcohol. The reaction includes flushing, headache, nausea, vomiting, sweating, and increased thirst. The reaction can last from 30 minutes to several hours. What side effects may I notice from receiving this medicine? Side effects that you should report to your doctor or health care professional as soon as possible: -allergic reactions like skin rash, itching or hives, swelling of the face, lips, or tongue -low blood counts - This drug may decrease the number of white blood cells, red blood cells and platelets. You may be at increased risk for infections and bleeding. -signs of infection - fever or chills, cough, sore throat, pain or difficulty passing urine -signs of decreased platelets or bleeding - bruising, pinpoint red spots on the skin, black, tarry stools, nosebleeds -signs of decreased red blood cells - unusually weak or tired, fainting spells, lightheadedness -breathing problems -chest pain -high or low blood pressure -mouth sores -nausea and vomiting -pain, swelling, redness or irritation at the injection site -pain, tingling, numbness in the hands or feet -slow or irregular heartbeat -swelling of the ankle, feet, hands Side effects that usually do not require medical attention (report to your doctor or health care professional if they continue or are bothersome): -bone pain -complete hair loss including hair on your head, underarms, pubic hair, eyebrows, and eyelashes -changes in the color of fingernails -diarrhea -loosening of the fingernails -loss of appetite -muscle or joint pain -red flush to skin -sweating This list may not describe all possible side effects. Call your doctor for  medical advice about side effects. You may report side effects to FDA at 1-800-FDA-1088. Where should I keep my medicine? This drug is given in a hospital or clinic and will not be  stored at home. NOTE: This sheet is a summary. It may not cover all possible information. If you have questions about this medicine, talk to your doctor, pharmacist, or health care provider.  2018 [employer] Standard (2015-01-29 19:58:00)   Carboplatin injection What is this medicine? CARBOPLATIN (KAR boe pla tin) is a chemotherapy drug. It targets fast dividing cells, like cancer cells, and causes these cells to die. This medicine is used to treat ovarian cancer and many other cancers. This medicine may be used for other purposes; ask your health care provider or pharmacist if you have questions. COMMON BRAND NAME(S): Paraplatin What should I tell my health care provider before I take this medicine? They need to know if you have any of these conditions: -blood disorders -hearing problems -kidney disease -recent or ongoing radiation therapy -an unusual or allergic reaction to carboplatin, cisplatin, other chemotherapy, other medicines, foods, dyes, or preservatives -pregnant or trying to get pregnant -breast-feeding How should I use this medicine? This drug is usually given as an infusion into a vein. It is administered in a hospital or clinic by a specially trained health care professional. Talk to your pediatrician regarding the use of this medicine in children. Special care may be needed. Overdosage: If you think you have taken too much of this medicine contact a poison control center or emergency room at once. NOTE: This medicine is only for you. Do not share this medicine with others. What if I miss a dose? It is important not to miss a dose. Call your doctor or health care professional if you are unable to keep an appointment. What may interact with this medicine? -medicines for seizures -medicines to increase blood counts like filgrastim, pegfilgrastim, sargramostim -some antibiotics like amikacin, gentamicin, neomycin, streptomycin, tobramycin -vaccines Talk to your doctor  or health care professional before taking any of these medicines: -acetaminophen -aspirin -ibuprofen -ketoprofen -naproxen This list may not describe all possible interactions. Give your health care provider a list of all the medicines, herbs, non-prescription drugs, or dietary supplements you use. Also tell them if you smoke, drink alcohol, or use illegal drugs. Some items may interact with your medicine. What should I watch for while using this medicine? Your condition will be monitored carefully while you are receiving this medicine. You will need important blood work done while you are taking this medicine. This drug may make you feel generally unwell. This is not uncommon, as chemotherapy can affect healthy cells as well as cancer cells. Report any side effects. Continue your course of treatment even though you feel ill unless your doctor tells you to stop. In some cases, you may be given additional medicines to help with side effects. Follow all directions for their use. Call your doctor or health care professional for advice if you get a fever, chills or sore throat, or other symptoms of a cold or flu. Do not treat yourself. This drug decreases your body's ability to fight infections. Try to avoid being around people who are sick. This medicine may increase your risk to bruise or bleed. Call your doctor or health care professional if you notice any unusual bleeding. Be careful brushing and flossing your teeth or using a toothpick because you may get an infection or bleed more easily. If you have any dental work done,  tell your dentist you are receiving this medicine. Avoid taking products that contain aspirin, acetaminophen, ibuprofen, naproxen, or ketoprofen unless instructed by your doctor. These medicines may hide a fever. Do not become pregnant while taking this medicine. Women should inform their doctor if they wish to become pregnant or think they might be pregnant. There is a potential  for serious side effects to an unborn child. Talk to your health care professional or pharmacist for more information. Do not breast-feed an infant while taking this medicine. What side effects may I notice from receiving this medicine? Side effects that you should report to your doctor or health care professional as soon as possible: -allergic reactions like skin rash, itching or hives, swelling of the face, lips, or tongue -signs of infection - fever or chills, cough, sore throat, pain or difficulty passing urine -signs of decreased platelets or bleeding - bruising, pinpoint red spots on the skin, black, tarry stools, nosebleeds -signs of decreased red blood cells - unusually weak or tired, fainting spells, lightheadedness -breathing problems -changes in hearing -changes in vision -chest pain -high blood pressure -low blood counts - This drug may decrease the number of white blood cells, red blood cells and platelets. You may be at increased risk for infections and bleeding. -nausea and vomiting -pain, swelling, redness or irritation at the injection site -pain, tingling, numbness in the hands or feet -problems with balance, talking, walking -trouble passing urine or change in the amount of urine Side effects that usually do not require medical attention (report to your doctor or health care professional if they continue or are bothersome): -hair loss -loss of appetite -metallic taste in the mouth or changes in taste This list may not describe all possible side effects. Call your doctor for medical advice about side effects. You may report side effects to FDA at 1-800-FDA-1088. Where should I keep my medicine? This drug is given in a hospital or clinic and will not be stored at home. NOTE: This sheet is a summary. It may not cover all possible information. If you have questions about this medicine, talk to your doctor, pharmacist, or health care provider.  2018 [employer] Standard  (2007-07-05 14:38:05)

## 2017-06-29 ENCOUNTER — Ambulatory Visit
Admission: RE | Admit: 2017-06-29 | Discharge: 2017-06-29 | Disposition: A | Discharge status: Home / Self Care | Payer: PPO | Source: Ambulatory Visit | Attending: Radiation Oncology | Admitting: Radiation Oncology

## 2017-06-29 DIAGNOSIS — C3431 Malignant neoplasm of lower lobe, right bronchus or lung: Secondary | ICD-10-CM | POA: Diagnosis not present

## 2017-06-29 DIAGNOSIS — Z510 Encounter for antineoplastic radiation therapy: Secondary | ICD-10-CM | POA: Diagnosis not present

## 2017-06-29 LAB — GUARDANT 360

## 2017-06-29 NOTE — Progress Notes (Signed)
[facility] Progress Note   Katelyn Kennedy [id number] 05-05-1945 72 y.o.  Katelyn Kennedy is managed by Dr. Eilleen Kempf  Actively treated with chemotherapy: yes  Current Therapy: carboplatin and paclitaxel  Last Treated: 06/28/2017.  Assessment: Plan:    Muscle spasm of back   Muscle spasms above the left back: Patient was reassured that her reaction was not likely related to Taxol.  The patient's muscle spasm resolved spontaneously with repositioning.  Please see After Visit Summary for patient specific instructions.  Future Appointments  Date Time Provider [place]  06/29/2017  2:25 PM [facility] LINAC 1 [facility] None  06/30/2017  2:30 PM [facility] LINAC 1 [facility] None  07/01/2017 10:15 AM [facility] LINAC 1 [facility] None  07/02/2017 10:15 AM [facility] LINAC 1 [facility] None  07/05/2017 12:45 PM [facility] LINAC 1 [facility] None  07/05/2017  1:45 PM [facility] LAB 1 [facility] None  07/05/2017  2:30 PM Curcio, Kristin R, NP [facility] None  07/05/2017  3:00 PM [facility] J32 DNS [facility] None  07/06/2017 11:30 AM [facility] LINAC 1 [facility] None  07/07/2017 11:00 AM [facility] LINAC 1 [facility] None  07/08/2017  8:45 AM [facility] LINAC 1 [facility] None  07/09/2017  8:45 AM [facility] LINAC 1 [facility] None  07/12/2017  8:45 AM [facility] LINAC 1 [facility] None  07/12/2017  9:15 AM [facility] LAB 3 [facility] None  07/12/2017  9:45 AM Curt Bears, MD [facility] None  07/12/2017 10:45 AM [facility] I25 DNS [facility] None  07/13/2017  8:45 AM [facility] LINAC 1 [facility] None  07/14/2017  8:45 AM [facility] LINAC 1 [facility] None  07/15/2017  8:45 AM [facility] LINAC 1 [facility] None  07/16/2017  8:45 AM [facility] LINAC 1 [facility] None  07/19/2017  9:00 AM [facility] LINAC 1 [facility] None  07/19/2017  9:45 AM [facility] LAB 2 [facility] None  07/19/2017 10:45 AM [facility] B7 [facility] None    07/20/2017  9:00 AM [facility] LINAC 1 [facility] None  07/21/2017  9:00 AM [facility] LINAC 1 [facility] None  07/22/2017  9:00 AM [facility] LINAC 1 [facility] None  07/23/2017  9:00 AM [facility] LINAC 1 [facility] None  07/26/2017  9:00 AM [facility] LINAC 1 [facility] None  07/26/2017  9:30 AM [facility] LAB 2 [facility] None  07/26/2017 10:00 AM Curcio, Kristin R, NP [facility] None  07/26/2017 10:45 AM [facility] E15 [facility] None  07/27/2017  9:00 AM [facility] LINAC 1 [facility] None  07/28/2017  9:00 AM [facility] LINAC 1 [facility] None  07/29/2017  9:00 AM [facility] LINAC 1 [facility] None  07/30/2017  9:00 AM [facility] LINAC 1 [facility] None  08/02/2017  9:00 AM [facility] LINAC 1 [facility] None  08/02/2017  9:30 AM [facility] LAB 5 [facility] None  08/02/2017 10:15 AM [facility] E17 [facility] None  08/03/2017  9:00 AM [facility] LINAC 1 [facility] None  08/04/2017  9:00 AM [facility] LINAC 1 [facility] None  08/05/2017  9:00 AM [facility] LINAC 1 [facility] None  08/06/2017  9:00 AM [facility] LINAC 1 [facility] None  08/09/2017  9:00 AM [facility] LINAC 1 [facility] None  08/09/2017  9:30 AM [facility] LAB 5 [facility] None  08/09/2017 10:00 AM Curcio, Kristin R, NP [facility] None  08/09/2017 10:45 AM [facility] B7 [facility] None  08/10/2017  9:00 AM [facility] LINAC 1 [facility] None  08/11/2017  9:00 AM [facility] LINAC 1 [facility] None  08/11/2017  9:00 AM Pinson, Venetia Maxon, LPN [facility]  04/21/6220 10:00 AM [name], MD [facility]  08/18/2017 11:00 AM Stoioff, Ronda Fairly, MD [facility] None  No orders of the defined types were placed in this encounter.      Subjective:   Patient ID:  Katelyn Kennedy is a 72 y.o. (DOB Nov 03, 1945) female.  Chief Complaint: No chief complaint on file.   HPI Katelyn Kennedy is a 72 year old female with a suspected stage IV (T2a, N2, M1a) non-small cell lung cancer, adenocarcinoma with a  right lower lobe mass, subcarinal lymphadenopathy pulmonary nodules which are suspicious for metastatic disease.  She is treated with concurrent chemoradiation with carboplatin and paclitaxel.  She is receiving cycle 1 today.  This provider was asked to see the patient as she was receiving Taxol and reported left back pain.  She denied any other issues of concern.  Medications: I have reviewed the patient's current medications.  Allergies:  Allergies  Allergen Reactions  . Codeine Nausea Only    Past Medical History:  Diagnosis Date  . Allergic rhinitis   . Arthritis   . Diabetes mellitus without complication (HCC)    diet control/no meds per pt  . Dyspnea   . History of chicken pox   . Smoker     Past Surgical History:  Procedure Laterality Date  . ANKLE FRACTURE SURGERY Left 10 years ago   "hard to wake up from anesthesia" per pt  . [place]  . BREAST BIOPSY Right 1978   BENIGN CYST  . BREAST EXCISIONAL BIOPSY Left 2011   Benign  . BREAST EXCISIONAL BIOPSY Right 1985   Benign   . CATARACT EXTRACTION    . CATARACT EXTRACTION W/ INTRAOCULAR LENS IMPLANT Bilateral   . COLONOSCOPY  2011  . ELECTROMAGNETIC NAVIGATION BROCHOSCOPY N/A 06/08/2017   Procedure: ELECTROMAGNETIC NAVIGATION BRONCHOSCOPY;  Surgeon: Flora Lipps, MD;  Location: [facility];  Service: Cardiopulmonary;  Laterality: N/A;  . POLYPECTOMY  2011  . 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 TUBAL LIGATION      Family History  Problem Relation Age of Onset  . Diabetes Mother   . Cancer Sister 42       BREAST  . Breast cancer Sister   . Cancer Maternal Grandmother        COLON  . Colon cancer Maternal Grandmother 86  . Stomach cancer Neg Hx     Social History   Socioeconomic History  .  Marital status: Divorced    Spouse name: Not on file  . Number of children: Not on file  . Years of education: Not on file  . Highest education level: Not on file  Social Needs  . Financial resource strain: Not on file  . Food insecurity - worry: Not on file  . Food insecurity - inability: Not on file  . Transportation needs - medical: Not on file  . Transportation needs - non-medical: Not on file  Occupational History  . Not on file  Tobacco Use  . Smoking status: Former Smoker    Packs/day: 0.50    Years: 40.00    Pack years: 20.00    Types: Cigarettes    Last attempt to quit: 04/29/2017    Years since quitting: 0.1  . Smokeless tobacco: Never Used  Substance and Sexual Activity  . Alcohol use: Yes    Comment: occassionally  . Drug use: No  . Sexual activity: Not on file  Other Topics Concern  . Not on file  Social History Narrative  . Not on file    Past Medical History, Surgical history, Social history, and Family history were reviewed and updated as appropriate.   Please see review of systems for further details on the patient's review from today.   Review of Systems:  Review of Systems  HENT: Negative for trouble swallowing.   Respiratory: Negative for cough, chest tightness, shortness of breath and wheezing.   Cardiovascular: Negative for chest pain and palpitations.  Gastrointestinal: Negative for nausea and vomiting.  Musculoskeletal: Positive for back pain and myalgias.    Objective:   Physical Exam:  There were no vitals taken for this visit. ECOG: 0  Physical Exam  Constitutional: No distress.  HENT:  Head: Normocephalic and atraumatic.  Cardiovascular: Normal rate, regular rhythm and normal heart sounds. Exam reveals no gallop and no friction rub.  No murmur heard. Pulmonary/Chest: Effort normal and breath sounds normal. No respiratory distress. She has no wheezes. She has no rales.  Musculoskeletal: She exhibits tenderness (There is an area of  induration and tenderness in the left posterior back which is consistent with a muscle spasm.).  Neurological: She is alert. Coordination normal.  Skin: Skin is warm and dry. She is not diaphoretic.    Lab Review:     Component Value Date/Time   NA 140 06/28/2017 0754   K 4.3 06/28/2017 0754   CL 107 06/28/2017 0754   CO2 23 06/28/2017 0754   GLUCOSE 124 06/28/2017 0754   BUN 16 06/28/2017 0754   CREATININE 0.82 06/28/2017 0754   CALCIUM 10.2 06/28/2017 0754   PROT 6.7 06/28/2017 0754   ALBUMIN 3.8 06/28/2017 0754   AST 16 06/28/2017 0754   ALT 15 06/28/2017 0754   ALKPHOS 76 06/28/2017 0754   BILITOT 0.4 06/28/2017 0754   GFRNONAA >60 06/28/2017 0754   GFRAA >60 06/28/2017 0754       Component Value Date/Time   WBC 6.0 06/28/2017 0754   WBC 9.8 03/16/2017 1614   RBC 4.13 06/28/2017 0754   HGB 15.0 03/16/2017 1614   HCT 39.3 06/28/2017 0754   PLT 234 06/28/2017 0754   MCV 95.2 06/28/2017 0754   MCH 31.9 06/28/2017 0754   MCHC 33.4 06/28/2017 0754   RDW 13.1 06/28/2017 0754   LYMPHSABS 1.6 06/28/2017 0754   MONOABS 0.6 06/28/2017 0754   EOSABS 0.1 06/28/2017 0754   BASOSABS 0.0 06/28/2017 0754   -------------------------------  Imaging from last 24 hours (if applicable):  Radiology interpretation: Mr Jeri Cos Wo Contrast  Result Date: 06/09/2017 CLINICAL DATA:  Recent diagnosis bladder cancer with pulmonary mass. Assess for metastatic disease. EXAM: MRI HEAD WITHOUT AND WITH CONTRAST TECHNIQUE: Multiplanar, multiecho pulse sequences of the brain and surrounding structures were obtained without and with intravenous contrast. CONTRAST:  55mL MULTIHANCE GADOBENATE DIMEGLUMINE 529 MG/ML IV SOLN COMPARISON:  None. FINDINGS: Brain: Brain has normal appearance without evidence of malformation, atrophy, old or acute small or large vessel infarction, mass lesion, hemorrhage, hydrocephalus or extra-axial collection. After contrast administration, no abnormal enhancement occurs.  Vascular: Major vessels at the base of the brain show flow. Venous sinuses appear patent. Vertebral arteries are tortuous, indenting the pontomedullary junction region. Usually, this is not significant. Skull and upper cervical spine: Normal. Sinuses/Orbits: Clear/normal. Other: None significant. IMPRESSION: Normal examination.  No evidence of metastatic disease. Electronically Signed   By: Nelson Chimes M.D.   On: 06/09/2017 13:15   Dg C-arm 1-60 Min-no  Report  Result Date: 06/08/2017 Fluoroscopy was utilized by the requesting physician.  No radiographic interpretation.   Ct Super D Chest Wo Contrast  Result Date: 06/08/2017 CLINICAL DATA:  Hypermetabolic right lower lobe lung mass. Preoperative for endobronchial biopsy. History of bladder cancer status post TURBT 05/04/2017. EXAM: CT CHEST WITHOUT CONTRAST TECHNIQUE: Multidetector CT imaging of the chest was performed using thin slice collimation for electromagnetic bronchoscopy planning purposes, without intravenous contrast. COMPARISON:  05/26/2017 PET-CT. FINDINGS: Cardiovascular: Normal heart size. No significant pericardial fluid/thickening. Left main, left anterior descending and right coronary atherosclerosis. Atherosclerotic nonaneurysmal thoracic aorta. Normal caliber pulmonary arteries. Mediastinum/Nodes: No discrete thyroid nodules. Unremarkable esophagus. No axillary adenopathy. Stable mildly enlarged 1.0 cm subcarinal node (series 2/image 29). No additional pathologically enlarged mediastinal nodes. No discrete hilar adenopathy this noncontrast scan. Lungs/Pleura: No pneumothorax. No pleural effusion. Mild centrilobular emphysema. Persistent anterior right lower lobe 3.4 x 1.9 cm lung mass (series 3/image 40). Stable scattered solid pulmonary nodules in the right lung, largest 7 mm in the basilar right lower lobe (series 3/image 47). No new significant pulmonary nodules. No acute consolidative airspace disease. Upper abdomen: Two scattered  subcentimeter hypodense liver lesions are too small to characterize and stable since 04/22/2017 CT. Musculoskeletal: No aggressive appearing focal osseous lesions. Moderate thoracic spondylosis. IMPRESSION: 1. Persistent solid 3.4 cm anterior right lower lobe lung mass, suspicious for primary bronchogenic carcinoma given hypermetabolism on 05/26/2017 PET-CT. 2. Stable scattered small solid pulmonary nodules in the right lung, largest 0.7 cm in the basilar right lower lobe. Pulmonary metastases not excluded. Recommend attention on follow-up chest CT in 3-6 months. 3. Stable mild subcarinal lymphadenopathy, suspicious for nodal metastasis given hypermetabolism on 05/26/2017 PET-CT. 4. Left main and two-vessel coronary atherosclerosis. Aortic Atherosclerosis (ICD10-I70.0) and Emphysema (ICD10-J43.9). Electronically Signed   By: Ilona Sorrel M.D.   On: 06/08/2017 13:56

## 2017-06-30 ENCOUNTER — Encounter: Payer: Self-pay | Admitting: Internal Medicine

## 2017-06-30 ENCOUNTER — Telehealth: Payer: Self-pay | Admitting: *Deleted

## 2017-06-30 ENCOUNTER — Ambulatory Visit
Admission: RE | Admit: 2017-06-30 | Discharge: 2017-06-30 | Disposition: A | Discharge status: Home / Self Care | Payer: PPO | Source: Ambulatory Visit | Attending: Radiation Oncology | Admitting: Radiation Oncology

## 2017-06-30 DIAGNOSIS — Z510 Encounter for antineoplastic radiation therapy: Secondary | ICD-10-CM | POA: Diagnosis not present

## 2017-06-30 DIAGNOSIS — C3431 Malignant neoplasm of lower lobe, right bronchus or lung: Secondary | ICD-10-CM | POA: Diagnosis not present

## 2017-06-30 NOTE — Telephone Encounter (Signed)
TCT patient after first time taxol/carbo infusion on 3/18.  Pt denies any problems, reports eating and drinking normally and no nausea.  States she has been slightly constipated but is taking a dulcolax with dinner to help.  Advised pt to continue drinking lots of water and to call if she has any questions or concerns.  Gave pt schedule for Monday.  Pt verbalized understanding.

## 2017-06-30 NOTE — Progress Notes (Signed)
Unfortunately there aren't any foundations offering copay assistance for her Dx.  I emailed Cindy in the radiation department requesting she reach out to the pt to see if she can use the [facility] grant to assist with gasoline.

## 2017-06-30 NOTE — Telephone Encounter (Signed)
-----   Message from Rennis Harding, RN sent at 06/28/2017  9:50 AM EDT ----- Regarding: Dr. Julien Nordmann 1st time chemo f/u 1st time chemo f/u

## 2017-07-01 ENCOUNTER — Ambulatory Visit
Admission: RE | Admit: 2017-07-01 | Discharge: 2017-07-01 | Disposition: A | Discharge status: Home / Self Care | Payer: PPO | Source: Ambulatory Visit | Attending: Radiation Oncology | Admitting: Radiation Oncology

## 2017-07-01 DIAGNOSIS — Z510 Encounter for antineoplastic radiation therapy: Secondary | ICD-10-CM | POA: Diagnosis not present

## 2017-07-01 DIAGNOSIS — C3431 Malignant neoplasm of lower lobe, right bronchus or lung: Secondary | ICD-10-CM | POA: Diagnosis not present

## 2017-07-02 ENCOUNTER — Ambulatory Visit
Admission: RE | Admit: 2017-07-02 | Discharge: 2017-07-02 | Disposition: A | Discharge status: Home / Self Care | Payer: PPO | Source: Ambulatory Visit | Attending: Radiation Oncology | Admitting: Radiation Oncology

## 2017-07-02 DIAGNOSIS — C3431 Malignant neoplasm of lower lobe, right bronchus or lung: Secondary | ICD-10-CM | POA: Diagnosis not present

## 2017-07-02 DIAGNOSIS — Z510 Encounter for antineoplastic radiation therapy: Secondary | ICD-10-CM | POA: Diagnosis not present

## 2017-07-02 MED ORDER — SONAFINE EX EMUL
1.0000 "application " | Freq: Two times a day (BID) | CUTANEOUS | Status: DC
  Administered 2017-07-02: 1 via TOPICAL

## 2017-07-02 NOTE — Progress Notes (Signed)
Pt here for patient teaching.  Pt given Radiation and You booklet, skin care instructions, Alra deodorant and Radiaplex gel.  Reviewed areas of pertinence such as fatigue, skin changes, throat changes, cough and shortness of breath . Pt able to give teach back of drink plenty of water,apply Sonafine bid. Pt verbalizes understanding of information given and will contact nursing with any questions or concerns.     [email or web address]

## 2017-07-05 ENCOUNTER — Inpatient Hospital Stay: Payer: PPO

## 2017-07-05 ENCOUNTER — Encounter: Payer: Self-pay | Admitting: Oncology

## 2017-07-05 ENCOUNTER — Ambulatory Visit
Admission: RE | Admit: 2017-07-05 | Discharge: 2017-07-05 | Disposition: A | Discharge status: Home / Self Care | Payer: PPO | Source: Ambulatory Visit | Attending: Radiation Oncology | Admitting: Radiation Oncology

## 2017-07-05 ENCOUNTER — Inpatient Hospital Stay (HOSPITAL_BASED_OUTPATIENT_CLINIC_OR_DEPARTMENT_OTHER): Payer: PPO | Admitting: Oncology

## 2017-07-05 ENCOUNTER — Telehealth: Payer: Self-pay | Admitting: Oncology

## 2017-07-05 ENCOUNTER — Encounter: Payer: Self-pay | Admitting: *Deleted

## 2017-07-05 VITALS — BP 138/66 | HR 81 | Temp 98.8°F | Resp 18 | Ht 59.0 in | Wt 128.9 lb

## 2017-07-05 DIAGNOSIS — J439 Emphysema, unspecified: Secondary | ICD-10-CM | POA: Diagnosis not present

## 2017-07-05 DIAGNOSIS — Z510 Encounter for antineoplastic radiation therapy: Secondary | ICD-10-CM | POA: Diagnosis not present

## 2017-07-05 DIAGNOSIS — Z5111 Encounter for antineoplastic chemotherapy: Secondary | ICD-10-CM | POA: Diagnosis not present

## 2017-07-05 DIAGNOSIS — C3431 Malignant neoplasm of lower lobe, right bronchus or lung: Secondary | ICD-10-CM

## 2017-07-05 DIAGNOSIS — R0602 Shortness of breath: Secondary | ICD-10-CM | POA: Diagnosis not present

## 2017-07-05 DIAGNOSIS — G4700 Insomnia, unspecified: Secondary | ICD-10-CM | POA: Diagnosis not present

## 2017-07-05 DIAGNOSIS — R599 Enlarged lymph nodes, unspecified: Secondary | ICD-10-CM | POA: Diagnosis not present

## 2017-07-05 DIAGNOSIS — F17210 Nicotine dependence, cigarettes, uncomplicated: Secondary | ICD-10-CM

## 2017-07-05 DIAGNOSIS — I700 Atherosclerosis of aorta: Secondary | ICD-10-CM

## 2017-07-05 DIAGNOSIS — R918 Other nonspecific abnormal finding of lung field: Secondary | ICD-10-CM | POA: Diagnosis not present

## 2017-07-05 DIAGNOSIS — Z79899 Other long term (current) drug therapy: Secondary | ICD-10-CM

## 2017-07-05 DIAGNOSIS — E119 Type 2 diabetes mellitus without complications: Secondary | ICD-10-CM | POA: Diagnosis not present

## 2017-07-05 DIAGNOSIS — I2510 Atherosclerotic heart disease of native coronary artery without angina pectoris: Secondary | ICD-10-CM | POA: Diagnosis not present

## 2017-07-05 LAB — CMP (CANCER CENTER ONLY)
ALT: 18 U/L (ref 0–55)
AST: 18 U/L (ref 5–34)
Albumin: 4.2 g/dL (ref 3.5–5.0)
Alkaline Phosphatase: 81 U/L (ref 40–150)
Anion gap: 9 (ref 3–11)
BUN: 16 mg/dL (ref 7–26)
CO2: 24 mmol/L (ref 22–29)
Calcium: 10.5 mg/dL — ABNORMAL HIGH (ref 8.4–10.4)
Chloride: 106 mmol/L (ref 98–109)
Creatinine: 0.78 mg/dL (ref 0.60–1.10)
GFR, Est AFR Am: >60 mL/min (ref >60)
GFR, Estimated: >60 mL/min (ref >60)
Glucose, Bld: 99 mg/dL (ref 70–140)
Potassium: 4.5 mmol/L (ref 3.5–5.1)
Sodium: 139 mmol/L (ref 136–145)
Total Bilirubin: 0.6 mg/dL (ref 0.2–1.2)
Total Protein: 7.3 g/dL (ref 6.4–8.3)

## 2017-07-05 LAB — CBC WITH DIFFERENTIAL (CANCER CENTER ONLY)
Basophils Absolute: 0 10*3/uL (ref 0.0–0.1)
Basophils Relative: 1 %
Eosinophils Absolute: 0.1 10*3/uL (ref 0.0–0.5)
Eosinophils Relative: 1 %
HCT: 40.1 % (ref 34.8–46.6)
Hemoglobin: 13.7 g/dL (ref 11.6–15.9)
Lymphocytes Relative: 26 %
Lymphs Abs: 1.3 10*3/uL (ref 0.9–3.3)
MCH: 32.3 pg (ref 25.1–34.0)
MCHC: 34.2 g/dL (ref 31.5–36.0)
MCV: 94.6 fL (ref 79.5–101.0)
Monocytes Absolute: 0.5 10*3/uL (ref 0.1–0.9)
Monocytes Relative: 9 %
Neutro Abs: 3.2 10*3/uL (ref 1.5–6.5)
Neutrophils Relative %: 63 %
Platelet Count: 253 10*3/uL (ref 145–400)
RBC: 4.24 MIL/uL (ref 3.70–5.45)
RDW: 12.9 % (ref 11.2–14.5)
WBC Count: 5.1 10*3/uL (ref 3.9–10.3)

## 2017-07-05 MED ORDER — SODIUM CHLORIDE 0.9 % IV SOLN
45.0000 mg/m2 | Freq: Once | INTRAVENOUS | Status: AC
  Administered 2017-07-05: 72 mg via INTRAVENOUS
  Filled 2017-07-05: qty 12

## 2017-07-05 MED ORDER — SODIUM CHLORIDE 0.9 % IV SOLN
20.0000 mg | Freq: Once | INTRAVENOUS | Status: AC
  Administered 2017-07-05: 20 mg via INTRAVENOUS
  Filled 2017-07-05: qty 2

## 2017-07-05 MED ORDER — DIPHENHYDRAMINE HCL 50 MG/ML IJ SOLN
50.0000 mg | Freq: Once | INTRAMUSCULAR | Status: AC
  Administered 2017-07-05: 50 mg via INTRAVENOUS

## 2017-07-05 MED ORDER — SODIUM CHLORIDE 0.9 % IV SOLN
Freq: Once | INTRAVENOUS | Status: AC
  Administered 2017-07-05: 16:00:00 via INTRAVENOUS

## 2017-07-05 MED ORDER — CARBOPLATIN CHEMO INJECTION 450 MG/45ML
142.2000 mg | Freq: Once | INTRAVENOUS | Status: AC
  Administered 2017-07-05: 140 mg via INTRAVENOUS
  Filled 2017-07-05: qty 14

## 2017-07-05 MED ORDER — PALONOSETRON HCL INJECTION 0.25 MG/5ML
0.2500 mg | Freq: Once | INTRAVENOUS | Status: AC
  Administered 2017-07-05: 0.25 mg via INTRAVENOUS

## 2017-07-05 MED ORDER — FAMOTIDINE IN NACL 20-0.9 MG/50ML-% IV SOLN
INTRAVENOUS | Status: AC
  Filled 2017-07-05: qty 50

## 2017-07-05 MED ORDER — PALONOSETRON HCL INJECTION 0.25 MG/5ML
INTRAVENOUS | Status: AC
  Filled 2017-07-05: qty 5

## 2017-07-05 MED ORDER — FAMOTIDINE IN NACL 20-0.9 MG/50ML-% IV SOLN
20.0000 mg | Freq: Once | INTRAVENOUS | Status: AC
  Administered 2017-07-05: 20 mg via INTRAVENOUS

## 2017-07-05 MED ORDER — DIPHENHYDRAMINE HCL 50 MG/ML IJ SOLN
INTRAMUSCULAR | Status: AC
  Filled 2017-07-05: qty 1

## 2017-07-05 NOTE — Progress Notes (Signed)
[facility] OFFICE PROGRESS NOTE  Katelyn Sanders, MD [place] [address]  DIAGNOSIS: highly suspicious for stage IV (T2a,N2, M1a) non-small cell lung cancer, adenocarcinoma presenting with right lower lobe lung mass in addition to subcarinal lymphadenopathy and suspicious metastatic pulmonary nodules.  PRIOR THERAPY: None  CURRENT THERAPY: Concurrent chemoradiation with carboplatin for an AUC of 2 with paclitaxel 45 mg/m weekly. First dose given on 06/28/2017.  INTERVAL HISTORY: Katelyn Kennedy 72 y.o. female returns for routine follow-up visit by herself.  The patient is feeling fine today and has no specific complaints.  The patient tolerated her first cycle of chemotherapy fairly well.  She denies fevers and chills.  Denies chest pain, shortness breath, cough, hemoptysis.  Denies nausea, vomiting, constipation, diarrhea.  Denies recent weight loss or night sweats.  The patient is here for evaluation prior to cycle #2 of her chemotherapy.  MEDICAL HISTORY: Past Medical History:  Diagnosis Date  . Allergic rhinitis   . Arthritis   . Diabetes mellitus without complication (HCC)    diet control/no meds per pt  . Dyspnea   . History of chicken pox   . Smoker     ALLERGIES:  is allergic to codeine.  MEDICATIONS:  Current Outpatient Medications  Medication Sig Dispense Refill  . acetaminophen (TYLENOL) 500 MG tablet Take 500 mg by mouth daily as needed for moderate pain or headache.    Marland Kitchen atorvastatin (LIPITOR) 20 MG tablet TAKE 1 TABLET BY MOUTH DAILY (Patient taking differently: Take 20 mg by mouth daily) 90 tablet 3  . Calcium Carbonate-Vitamin D (CALCIUM + D) 600-200 MG-UNIT per tablet Take 1 tablet by mouth daily.     . fluticasone (FLONASE) 50 MCG/ACT nasal spray PLACE 2 SPRAYS INTO THE NOSE DAILY. (Patient taking differently: PLACE 2 SPRAYS INTO THE NOSE ONCE DAILY AS NEEDED FOR ALLERGIES) 16 g 5  . lisinopril (PRINIVIL,ZESTRIL) 5 MG  tablet Take 0.5 tablets (2.5 mg total) by mouth daily. 45 tablet 3  . Multiple Vitamin (MULTIVITAMIN) capsule Take 1 capsule by mouth daily.      . prochlorperazine (COMPAZINE) 10 MG tablet Take 1 tablet (10 mg total) by mouth every 6 (six) hours as needed for nausea or vomiting. 30 tablet 0  . temazepam (RESTORIL) 15 MG capsule Take 1 capsule (15 mg total) by mouth at bedtime as needed for sleep. 30 capsule 0  . Tetrahydrozoline HCl (VISINE OP) Place 1 drop into both eyes daily as needed (for dry eyes).     . traMADol (ULTRAM) 50 MG tablet Take 1 tablet (50 mg total) by mouth every 6 (six) hours as needed for moderate pain. 15 tablet 0   No current facility-administered medications for this visit.     SURGICAL HISTORY:  Past Surgical History:  Procedure Laterality Date  . ANKLE FRACTURE SURGERY Left 10 years ago   "hard to wake up from anesthesia" per pt  . [place]  . BREAST BIOPSY Right 1978   BENIGN CYST  . BREAST EXCISIONAL BIOPSY Left 2011   Benign  . BREAST EXCISIONAL BIOPSY Right 1985   Benign   . CATARACT EXTRACTION    . CATARACT EXTRACTION W/ INTRAOCULAR LENS IMPLANT Bilateral   . COLONOSCOPY  2011  . ELECTROMAGNETIC NAVIGATION BROCHOSCOPY N/A 06/08/2017   Procedure: ELECTROMAGNETIC NAVIGATION BRONCHOSCOPY;  Surgeon: Flora Lipps, MD;  Location: [facility] ORS;  Service: Cardiopulmonary;  Laterality: N/A;  . POLYPECTOMY  2011  . TRANSURETHRAL  RESECTION OF BLADDER TUMOR WITH MITOMYCIN-C N/A 05/04/2017   Procedure: TRANSURETHRAL RESECTION OF BLADDER TUMOR WITH gemcitabine;  Surgeon: Abbie Sons, MD;  Location: [facility] ORS;  Service: Urology;  Laterality: N/A;  . TUBAL LIGATION      REVIEW OF SYSTEMS:   Review of Systems  Constitutional: Negative for appetite change, chills, fatigue, fever and unexpected weight change.  HENT:   Negative for mouth sores, nosebleeds, sore throat and trouble swallowing.   Eyes: Negative for eye problems and icterus.   Respiratory: Negative for cough, hemoptysis, shortness of breath and wheezing.   Cardiovascular: Negative for chest pain and leg swelling.  Gastrointestinal: Negative for abdominal pain, constipation, diarrhea, nausea and vomiting.  Genitourinary: Negative for bladder incontinence, difficulty urinating, dysuria, frequency and hematuria.   Musculoskeletal: Negative for back pain, gait problem, neck pain and neck stiffness.  Skin: Negative for itching and rash.  Neurological: Negative for dizziness, extremity weakness, gait problem, headaches, light-headedness and seizures.  Hematological: Negative for adenopathy. Does not bruise/bleed easily.  Psychiatric/Behavioral: Negative for confusion, depression and sleep disturbance. The patient is not nervous/anxious.     PHYSICAL EXAMINATION:  Blood pressure 138/66, pulse 81, temperature 98.8 F (37.1 C), temperature source Oral, resp. rate 18, height 4\' 11"  (1.499 m), weight 128 lb 14.4 oz (58.5 kg), SpO2 98 %.  ECOG PERFORMANCE STATUS: 1 - Symptomatic but completely ambulatory  Physical Exam  Constitutional: Oriented to person, place, and time and well-developed, well-nourished, and in no distress. No distress.  HENT:  Head: Normocephalic and atraumatic.  Mouth/Throat: Oropharynx is clear and moist. No oropharyngeal exudate.  Eyes: Conjunctivae are normal. Right eye exhibits no discharge. Left eye exhibits no discharge. No scleral icterus.  Neck: Normal range of motion. Neck supple.  Cardiovascular: Normal rate, regular rhythm, normal heart sounds and intact distal pulses.   Pulmonary/Chest: Effort normal and breath sounds normal. No respiratory distress. No wheezes. No rales.  Abdominal: Soft. Bowel sounds are normal. Exhibits no distension and no mass. There is no tenderness.  Musculoskeletal: Normal range of motion. Exhibits no edema.  Lymphadenopathy:    No cervical adenopathy.  Neurological: Alert and oriented to person, place, and  time. Exhibits normal muscle tone. Gait normal. Coordination normal.  Skin: Skin is warm and dry. No rash noted. Not diaphoretic. No erythema. No pallor.  Psychiatric: Mood, memory and judgment normal.  Vitals reviewed.  LABORATORY DATA: Lab Results  Component Value Date   WBC 5.1 07/05/2017   HGB 15.0 03/16/2017   HCT 40.1 07/05/2017   MCV 94.6 07/05/2017   PLT 253 07/05/2017      Chemistry      Component Value Date/Time   NA 139 07/05/2017 1257   K 4.5 07/05/2017 1257   CL 106 07/05/2017 1257   CO2 24 07/05/2017 1257   BUN 16 07/05/2017 1257   CREATININE 0.78 07/05/2017 1257      Component Value Date/Time   CALCIUM 10.5 (H) 07/05/2017 1257   ALKPHOS 81 07/05/2017 1257   AST 18 07/05/2017 1257   ALT 18 07/05/2017 1257   BILITOT 0.6 07/05/2017 1257       RADIOGRAPHIC STUDIES:  Mr Jeri Cos ZS Contrast  Result Date: 06/09/2017 CLINICAL DATA:  Recent diagnosis bladder cancer with pulmonary mass. Assess for metastatic disease. EXAM: MRI HEAD WITHOUT AND WITH CONTRAST TECHNIQUE: Multiplanar, multiecho pulse sequences of the brain and surrounding structures were obtained without and with intravenous contrast. CONTRAST:  77mL MULTIHANCE GADOBENATE DIMEGLUMINE 529 MG/ML IV SOLN COMPARISON:  None. FINDINGS: Brain: Brain has normal appearance without evidence of malformation, atrophy, old or acute small or large vessel infarction, mass lesion, hemorrhage, hydrocephalus or extra-axial collection. After contrast administration, no abnormal enhancement occurs. Vascular: Major vessels at the base of the brain show flow. Venous sinuses appear patent. Vertebral arteries are tortuous, indenting the pontomedullary junction region. Usually, this is not significant. Skull and upper cervical spine: Normal. Sinuses/Orbits: Clear/normal. Other: None significant. IMPRESSION: Normal examination.  No evidence of metastatic disease. Electronically Signed   By: Nelson Chimes M.D.   On: 06/09/2017 13:15    Dg C-arm 1-60 Min-no Report  Result Date: 06/08/2017 Fluoroscopy was utilized by the requesting physician.  No radiographic interpretation.   Ct Super D Chest Wo Contrast  Result Date: 06/08/2017 CLINICAL DATA:  Hypermetabolic right lower lobe lung mass. Preoperative for endobronchial biopsy. History of bladder cancer status post TURBT 05/04/2017. EXAM: CT CHEST WITHOUT CONTRAST TECHNIQUE: Multidetector CT imaging of the chest was performed using thin slice collimation for electromagnetic bronchoscopy planning purposes, without intravenous contrast. COMPARISON:  05/26/2017 PET-CT. FINDINGS: Cardiovascular: Normal heart size. No significant pericardial fluid/thickening. Left main, left anterior descending and right coronary atherosclerosis. Atherosclerotic nonaneurysmal thoracic aorta. Normal caliber pulmonary arteries. Mediastinum/Nodes: No discrete thyroid nodules. Unremarkable esophagus. No axillary adenopathy. Stable mildly enlarged 1.0 cm subcarinal node (series 2/image 29). No additional pathologically enlarged mediastinal nodes. No discrete hilar adenopathy this noncontrast scan. Lungs/Pleura: No pneumothorax. No pleural effusion. Mild centrilobular emphysema. Persistent anterior right lower lobe 3.4 x 1.9 cm lung mass (series 3/image 40). Stable scattered solid pulmonary nodules in the right lung, largest 7 mm in the basilar right lower lobe (series 3/image 47). No new significant pulmonary nodules. No acute consolidative airspace disease. Upper abdomen: Two scattered subcentimeter hypodense liver lesions are too small to characterize and stable since 04/22/2017 CT. Musculoskeletal: No aggressive appearing focal osseous lesions. Moderate thoracic spondylosis. IMPRESSION: 1. Persistent solid 3.4 cm anterior right lower lobe lung mass, suspicious for primary bronchogenic carcinoma given hypermetabolism on 05/26/2017 PET-CT. 2. Stable scattered small solid pulmonary nodules in the right lung, largest  0.7 cm in the basilar right lower lobe. Pulmonary metastases not excluded. Recommend attention on follow-up chest CT in 3-6 months. 3. Stable mild subcarinal lymphadenopathy, suspicious for nodal metastasis given hypermetabolism on 05/26/2017 PET-CT. 4. Left main and two-vessel coronary atherosclerosis. Aortic Atherosclerosis (ICD10-I70.0) and Emphysema (ICD10-J43.9). Electronically Signed   By: Ilona Sorrel M.D.   On: 06/08/2017 13:56     ASSESSMENT/PLAN:  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 [facility]) This is a very pleasant 72 year old white female with highly suspicious for stage IV (T2a,N2, M1a) non-small cell lung cancer, adenocarcinoma presenting with a right lower lobe lung mass in addition to subcarinal lymphadenopathy and suspicious metastatic pulmonary nodules. The patient is currently receiving a course of concurrent chemoradiation to the known right lower lobe lung mass and subcarinal lymphadenopathy with close monitoring of the other pulmonary nodules.    Her chemotherapy consists of carboplatin for an AUC of 2 and paclitaxel 45 mg/m given weekly with her radiation.   Status post 1 cycle which she tolerated fairly well with no concerning complaints.  Recommend that she proceed with cycle 2 as scheduled today. The patient will follow-up in 2 weeks for evaluation prior to cycle #4 of her chemotherapy.  Insomnia, she will continue Restoril 15 mg at bedtime as needed.  She was advised to call immediately if she has any concerning symptoms in the interval. The  patient voices understanding of current disease status and treatment options and is in agreement with the current care plan.  All questions were answered. The patient knows to call the clinic with any problems, questions or concerns. We can certainly see the patient much sooner if necessary.   No orders of the defined types were placed in this encounter.  Mikey Bussing, DNP, AGPCNP-BC, AOCNP 07/05/17

## 2017-07-05 NOTE — Telephone Encounter (Signed)
Appts already scheduled for 3/25 los . No additional appt to add.

## 2017-07-05 NOTE — Patient Instructions (Signed)
[facility] Discharge Instructions for Patients Receiving Chemotherapy  Today you received the following chemotherapy agents:  Taxol, Carboplatin  To help prevent nausea and vomiting after your treatment, we encourage you to take your nausea medication as prescribed.   If you develop nausea and vomiting that is not controlled by your nausea medication, call the clinic.   BELOW ARE SYMPTOMS THAT SHOULD BE REPORTED IMMEDIATELY:  *FEVER GREATER THAN 100.5 F  *CHILLS WITH OR WITHOUT FEVER  NAUSEA AND VOMITING THAT IS NOT CONTROLLED WITH YOUR NAUSEA MEDICATION  *UNUSUAL SHORTNESS OF BREATH  *UNUSUAL BRUISING OR BLEEDING  TENDERNESS IN MOUTH AND THROAT WITH OR WITHOUT PRESENCE OF ULCERS  *URINARY PROBLEMS  *BOWEL PROBLEMS  UNUSUAL RASH Items with * indicate a potential emergency and should be followed up as soon as possible.  Feel free to call the clinic should you have any questions or concerns. The clinic phone number is [phone].  Please show the CHEMO ALERT CARD at check-in to the Emergency Department and triage nurse.   

## 2017-07-05 NOTE — Assessment & Plan Note (Signed)
This is a very pleasant 72 year old white female with highly suspicious for stage IV (T2a,N2, M1a) non-small cell lung cancer, adenocarcinoma presenting with a right lower lobe lung mass in addition to subcarinal lymphadenopathy and suspicious metastatic pulmonary nodules. The patient is currently receiving a course of concurrent chemoradiation to the known right lower lobe lung mass and subcarinal lymphadenopathy with close monitoring of the other pulmonary nodules.    Her chemotherapy consists of carboplatin for an AUC of 2 and paclitaxel 45 mg/m given weekly with her radiation.   Status post 1 cycle which she tolerated fairly well with no concerning complaints.  Recommend that she proceed with cycle 2 as scheduled today. The patient will follow-up in 2 weeks for evaluation prior to cycle #4 of her chemotherapy.  Insomnia, she will continue Restoril 15 mg at bedtime as needed.  She was advised to call immediately if she has any concerning symptoms in the interval. The patient voices understanding of current disease status and treatment options and is in agreement with the current care plan.  All questions were answered. The patient knows to call the clinic with any problems, questions or concerns. We can certainly see the patient much sooner if necessary.

## 2017-07-06 ENCOUNTER — Ambulatory Visit
Admission: RE | Admit: 2017-07-06 | Discharge: 2017-07-06 | Disposition: A | Discharge status: Home / Self Care | Payer: PPO | Source: Ambulatory Visit | Attending: Radiation Oncology | Admitting: Radiation Oncology

## 2017-07-06 DIAGNOSIS — Z510 Encounter for antineoplastic radiation therapy: Secondary | ICD-10-CM | POA: Diagnosis not present

## 2017-07-06 DIAGNOSIS — C3431 Malignant neoplasm of lower lobe, right bronchus or lung: Secondary | ICD-10-CM | POA: Diagnosis not present

## 2017-07-07 ENCOUNTER — Ambulatory Visit
Admission: RE | Admit: 2017-07-07 | Discharge: 2017-07-07 | Disposition: A | Discharge status: Home / Self Care | Payer: PPO | Source: Ambulatory Visit | Attending: Radiation Oncology | Admitting: Radiation Oncology

## 2017-07-07 DIAGNOSIS — Z510 Encounter for antineoplastic radiation therapy: Secondary | ICD-10-CM | POA: Diagnosis not present

## 2017-07-07 DIAGNOSIS — C3431 Malignant neoplasm of lower lobe, right bronchus or lung: Secondary | ICD-10-CM | POA: Diagnosis not present

## 2017-07-08 ENCOUNTER — Ambulatory Visit
Admission: RE | Admit: 2017-07-08 | Discharge: 2017-07-08 | Disposition: A | Discharge status: Home / Self Care | Payer: PPO | Source: Ambulatory Visit | Attending: Radiation Oncology | Admitting: Radiation Oncology

## 2017-07-08 DIAGNOSIS — Z510 Encounter for antineoplastic radiation therapy: Secondary | ICD-10-CM | POA: Diagnosis not present

## 2017-07-08 DIAGNOSIS — C3431 Malignant neoplasm of lower lobe, right bronchus or lung: Secondary | ICD-10-CM | POA: Diagnosis not present

## 2017-07-09 ENCOUNTER — Ambulatory Visit
Admission: RE | Admit: 2017-07-09 | Discharge: 2017-07-09 | Disposition: A | Discharge status: Home / Self Care | Payer: PPO | Source: Ambulatory Visit | Attending: Radiation Oncology | Admitting: Radiation Oncology

## 2017-07-09 DIAGNOSIS — Z510 Encounter for antineoplastic radiation therapy: Secondary | ICD-10-CM | POA: Diagnosis not present

## 2017-07-09 DIAGNOSIS — C3431 Malignant neoplasm of lower lobe, right bronchus or lung: Secondary | ICD-10-CM | POA: Diagnosis not present

## 2017-07-09 MED ORDER — TEMAZEPAM 15 MG PO CAPS: 15.0000 mg | ORAL_CAPSULE | Freq: Every evening | ORAL | 0 refills | Status: DC | PRN

## 2017-07-09 MED ORDER — SUCRALFATE 1 G PO TABS: 1.0000 g | ORAL_TABLET | Freq: Four times a day (QID) | ORAL | 2 refills | Status: DC

## 2017-07-12 ENCOUNTER — Inpatient Hospital Stay: Payer: PPO | Attending: Internal Medicine

## 2017-07-12 ENCOUNTER — Encounter: Payer: Self-pay | Admitting: Internal Medicine

## 2017-07-12 ENCOUNTER — Ambulatory Visit
Admission: RE | Admit: 2017-07-12 | Discharge: 2017-07-12 | Disposition: A | Discharge status: Home / Self Care | Payer: PPO | Source: Ambulatory Visit | Attending: Radiation Oncology | Admitting: Radiation Oncology

## 2017-07-12 ENCOUNTER — Telehealth: Payer: Self-pay | Admitting: Internal Medicine

## 2017-07-12 ENCOUNTER — Inpatient Hospital Stay (HOSPITAL_BASED_OUTPATIENT_CLINIC_OR_DEPARTMENT_OTHER): Payer: PPO | Admitting: Internal Medicine

## 2017-07-12 ENCOUNTER — Inpatient Hospital Stay: Payer: PPO

## 2017-07-12 VITALS — BP 125/64 | HR 85 | Temp 98.5°F | Resp 18 | Ht 59.0 in | Wt 130.1 lb

## 2017-07-12 DIAGNOSIS — M129 Arthropathy, unspecified: Secondary | ICD-10-CM

## 2017-07-12 DIAGNOSIS — F17210 Nicotine dependence, cigarettes, uncomplicated: Secondary | ICD-10-CM

## 2017-07-12 DIAGNOSIS — C3431 Malignant neoplasm of lower lobe, right bronchus or lung: Secondary | ICD-10-CM | POA: Diagnosis not present

## 2017-07-12 DIAGNOSIS — G4700 Insomnia, unspecified: Secondary | ICD-10-CM | POA: Diagnosis not present

## 2017-07-12 DIAGNOSIS — Z5111 Encounter for antineoplastic chemotherapy: Secondary | ICD-10-CM

## 2017-07-12 DIAGNOSIS — Z510 Encounter for antineoplastic radiation therapy: Secondary | ICD-10-CM | POA: Insufficient documentation

## 2017-07-12 DIAGNOSIS — R0602 Shortness of breath: Secondary | ICD-10-CM | POA: Diagnosis not present

## 2017-07-12 DIAGNOSIS — R918 Other nonspecific abnormal finding of lung field: Secondary | ICD-10-CM

## 2017-07-12 DIAGNOSIS — R599 Enlarged lymph nodes, unspecified: Secondary | ICD-10-CM | POA: Diagnosis not present

## 2017-07-12 DIAGNOSIS — R1310 Dysphagia, unspecified: Secondary | ICD-10-CM | POA: Insufficient documentation

## 2017-07-12 DIAGNOSIS — Z79899 Other long term (current) drug therapy: Secondary | ICD-10-CM

## 2017-07-12 DIAGNOSIS — C679 Malignant neoplasm of bladder, unspecified: Secondary | ICD-10-CM | POA: Insufficient documentation

## 2017-07-12 DIAGNOSIS — E119 Type 2 diabetes mellitus without complications: Secondary | ICD-10-CM | POA: Insufficient documentation

## 2017-07-12 LAB — CMP (CANCER CENTER ONLY)
ALT: 19 U/L (ref 0–55)
AST: 18 U/L (ref 5–34)
Albumin: 3.7 g/dL (ref 3.5–5.0)
Alkaline Phosphatase: 73 U/L (ref 40–150)
Anion gap: 6 (ref 3–11)
BUN: 10 mg/dL (ref 7–26)
CO2: 23 mmol/L (ref 22–29)
Calcium: 9.7 mg/dL (ref 8.4–10.4)
Chloride: 111 mmol/L — ABNORMAL HIGH (ref 98–109)
Creatinine: 0.74 mg/dL (ref 0.60–1.10)
GFR, Est AFR Am: >60 mL/min (ref >60)
GFR, Estimated: >60 mL/min (ref >60)
Glucose, Bld: 97 mg/dL (ref 70–140)
Potassium: 4.5 mmol/L (ref 3.5–5.1)
Sodium: 140 mmol/L (ref 136–145)
Total Bilirubin: 0.3 mg/dL (ref 0.2–1.2)
Total Protein: 6.4 g/dL (ref 6.4–8.3)

## 2017-07-12 LAB — CBC WITH DIFFERENTIAL (CANCER CENTER ONLY)
Basophils Absolute: 0 K/uL (ref 0.0–0.1)
Basophils Relative: 0 %
Eosinophils Absolute: 0.1 K/uL (ref 0.0–0.5)
Eosinophils Relative: 1 %
HCT: 37.8 % (ref 34.8–46.6)
Hemoglobin: 12.8 g/dL (ref 11.6–15.9)
Lymphocytes Relative: 16 %
Lymphs Abs: 0.9 K/uL (ref 0.9–3.3)
MCH: 32.2 pg (ref 25.1–34.0)
MCHC: 33.8 g/dL (ref 31.5–36.0)
MCV: 95.1 fL (ref 79.5–101.0)
Monocytes Absolute: 0.4 K/uL (ref 0.1–0.9)
Monocytes Relative: 8 %
Neutro Abs: 4.1 K/uL (ref 1.5–6.5)
Neutrophils Relative %: 75 %
Platelet Count: 219 K/uL (ref 145–400)
RBC: 3.98 MIL/uL (ref 3.70–5.45)
RDW: 13.2 % (ref 11.2–14.5)
WBC Count: 5.5 K/uL (ref 3.9–10.3)

## 2017-07-12 MED ORDER — DIPHENHYDRAMINE HCL 50 MG/ML IJ SOLN
INTRAMUSCULAR | Status: AC
  Filled 2017-07-12: qty 1

## 2017-07-12 MED ORDER — FAMOTIDINE IN NACL 20-0.9 MG/50ML-% IV SOLN
INTRAVENOUS | Status: AC
  Filled 2017-07-12: qty 50

## 2017-07-12 MED ORDER — SODIUM CHLORIDE 0.9 % IV SOLN: 45.0000 mg/m2 | Freq: Once | INTRAVENOUS | Status: DC

## 2017-07-12 MED ORDER — SODIUM CHLORIDE 0.9 % IV SOLN
20.0000 mg | Freq: Once | INTRAVENOUS | Status: AC
  Administered 2017-07-12: 20 mg via INTRAVENOUS
  Filled 2017-07-12: qty 2

## 2017-07-12 MED ORDER — PALONOSETRON HCL INJECTION 0.25 MG/5ML
INTRAVENOUS | Status: AC
  Filled 2017-07-12: qty 5

## 2017-07-12 MED ORDER — SODIUM CHLORIDE 0.9 % IV SOLN
Freq: Once | INTRAVENOUS | Status: AC
  Administered 2017-07-12: 11:00:00 via INTRAVENOUS

## 2017-07-12 MED ORDER — PALONOSETRON HCL INJECTION 0.25 MG/5ML
0.2500 mg | Freq: Once | INTRAVENOUS | Status: AC
  Administered 2017-07-12: 0.25 mg via INTRAVENOUS

## 2017-07-12 MED ORDER — SODIUM CHLORIDE 0.9 % IV SOLN
45.0000 mg/m2 | Freq: Once | INTRAVENOUS | Status: AC
  Administered 2017-07-12: 72 mg via INTRAVENOUS
  Filled 2017-07-12: qty 12

## 2017-07-12 MED ORDER — DIPHENHYDRAMINE HCL 50 MG/ML IJ SOLN
50.0000 mg | Freq: Once | INTRAMUSCULAR | Status: AC
  Administered 2017-07-12: 50 mg via INTRAVENOUS

## 2017-07-12 MED ORDER — FAMOTIDINE IN NACL 20-0.9 MG/50ML-% IV SOLN
20.0000 mg | Freq: Once | INTRAVENOUS | Status: AC
  Administered 2017-07-12: 20 mg via INTRAVENOUS

## 2017-07-12 MED ORDER — SODIUM CHLORIDE 0.9 % IV SOLN
142.2000 mg | Freq: Once | INTRAVENOUS | Status: AC
  Administered 2017-07-12: 140 mg via INTRAVENOUS
  Filled 2017-07-12: qty 14

## 2017-07-12 NOTE — Patient Instructions (Signed)
[facility] Discharge Instructions for Patients Receiving Chemotherapy  Today you received the following chemotherapy agents:  Taxol, Carboplatin  To help prevent nausea and vomiting after your treatment, we encourage you to take your nausea medication as prescribed.   If you develop nausea and vomiting that is not controlled by your nausea medication, call the clinic.   BELOW ARE SYMPTOMS THAT SHOULD BE REPORTED IMMEDIATELY:  *FEVER GREATER THAN 100.5 F  *CHILLS WITH OR WITHOUT FEVER  NAUSEA AND VOMITING THAT IS NOT CONTROLLED WITH YOUR NAUSEA MEDICATION  *UNUSUAL SHORTNESS OF BREATH  *UNUSUAL BRUISING OR BLEEDING  TENDERNESS IN MOUTH AND THROAT WITH OR WITHOUT PRESENCE OF ULCERS  *URINARY PROBLEMS  *BOWEL PROBLEMS  UNUSUAL RASH Items with * indicate a potential emergency and should be followed up as soon as possible.  Feel free to call the clinic should you have any questions or concerns. The clinic phone number is [phone].  Please show the CHEMO ALERT CARD at check-in to the Emergency Department and triage nurse.   

## 2017-07-12 NOTE — Telephone Encounter (Signed)
Appts already scheduled per 4/1 los - no additional appts to add.

## 2017-07-12 NOTE — Progress Notes (Signed)
[facility] [phone]   [phone]  OFFICE PROGRESS NOTE  [name], MD [place] [address]  DIAGNOSIS: Questionable stage IV (T2a,N2, M1a) non-small cell lung cancer,adenocarcinomapresenting with right lower lobe lung mass in addition to subcarinal lymphadenopathy and suspicious metastatic pulmonary nodules.  PRIOR THERAPY: None.  CURRENT THERAPY: Concurrent chemoradiation with carboplatin for an AUC of 2 with paclitaxel 45 mg/m weekly. First dose given on 06/28/2017.  Status post 2 cycles.  INTERVAL HISTORY: Katelyn Kennedy 72 y.o. female returns to the clinic today for follow-up visit.  The patient is feeling fine today with no specific complaints.  She continues to tolerate her treatment with concurrent chemoradiation fairly well.  She denied having any recent weight loss or night sweats.  She has no nausea, vomiting, diarrhea or constipation.  She denied having any dysphagia or odynophagia.  The patient denied having any chest pain, shortness breath, cough or hemoptysis.  She continues to have intermittent insomnia.  She was a started on Restoril by Dr. Lisbeth Renshaw but she does not like to take it.  She is here today for evaluation before starting cycle #3.  MEDICAL HISTORY: Past Medical History:  Diagnosis Date  . Allergic rhinitis   . Arthritis   . Diabetes mellitus without complication (HCC)    diet control/no meds per pt  . Dyspnea   . History of chicken pox   . Smoker     ALLERGIES:  is allergic to codeine.  MEDICATIONS:  Current Outpatient Medications  Medication Sig Dispense Refill  . acetaminophen (TYLENOL) 500 MG tablet Take 500 mg by mouth daily as needed for moderate pain or headache.    Marland Kitchen atorvastatin (LIPITOR) 20 MG tablet TAKE 1 TABLET BY MOUTH DAILY (Patient taking differently: Take 20 mg by mouth daily) 90 tablet 3  . Calcium Carbonate-Vitamin D (CALCIUM + D) 600-200 MG-UNIT per tablet Take  1 tablet by mouth daily.     . fluticasone (FLONASE) 50 MCG/ACT nasal spray PLACE 2 SPRAYS INTO THE NOSE DAILY. (Patient taking differently: PLACE 2 SPRAYS INTO THE NOSE ONCE DAILY AS NEEDED FOR ALLERGIES) 16 g 5  . lisinopril (PRINIVIL,ZESTRIL) 5 MG tablet Take 0.5 tablets (2.5 mg total) by mouth daily. 45 tablet 3  . Multiple Vitamin (MULTIVITAMIN) capsule Take 1 capsule by mouth daily.      . prochlorperazine (COMPAZINE) 10 MG tablet Take 1 tablet (10 mg total) by mouth every 6 (six) hours as needed for nausea or vomiting. 30 tablet 0  . temazepam (RESTORIL) 15 MG capsule Take 1 capsule (15 mg total) by mouth at bedtime as needed for sleep. 30 capsule 0  . Tetrahydrozoline HCl (VISINE OP) Place 1 drop into both eyes daily as needed (for dry eyes).     . traMADol (ULTRAM) 50 MG tablet Take 1 tablet (50 mg total) by mouth every 6 (six) hours as needed for moderate pain. 15 tablet 0  . sucralfate (CARAFATE) 1 g tablet Take 1 tablet (1 g total) by mouth 4 (four) times daily. (Patient not taking: Reported on 07/12/2017) 120 tablet 2   No current facility-administered medications for this visit.     SURGICAL HISTORY:  Past Surgical History:  Procedure Laterality Date  . ANKLE FRACTURE SURGERY Left 10 years ago   "hard to wake up from anesthesia" per pt  . [place]  . BREAST BIOPSY Right 1978   BENIGN CYST  .  BREAST EXCISIONAL BIOPSY Left 2011   Benign  . BREAST EXCISIONAL BIOPSY Right 1985   Benign   . CATARACT EXTRACTION    . CATARACT EXTRACTION W/ INTRAOCULAR LENS IMPLANT Bilateral   . COLONOSCOPY  2011  . ELECTROMAGNETIC NAVIGATION BROCHOSCOPY N/A 06/08/2017   Procedure: ELECTROMAGNETIC NAVIGATION BRONCHOSCOPY;  Surgeon: Flora Lipps, MD;  Location: [facility] ORS;  Service: Cardiopulmonary;  Laterality: N/A;  . POLYPECTOMY  2011  . TRANSURETHRAL RESECTION OF BLADDER TUMOR WITH MITOMYCIN-C N/A 05/04/2017   Procedure: TRANSURETHRAL RESECTION OF BLADDER TUMOR WITH  gemcitabine;  Surgeon: Abbie Sons, MD;  Location: [facility] ORS;  Service: Urology;  Laterality: N/A;  . TUBAL LIGATION      REVIEW OF SYSTEMS:  A comprehensive review of systems was negative except for: Behavioral/Psych: positive for sleep disturbance   PHYSICAL EXAMINATION: General appearance: alert, cooperative and no distress Head: Normocephalic, without obvious abnormality, atraumatic Neck: no adenopathy, no JVD, supple, symmetrical, trachea midline and thyroid not enlarged, symmetric, no tenderness/mass/nodules Lymph nodes: Cervical, supraclavicular, and axillary nodes normal. Resp: clear to auscultation bilaterally Back: symmetric, no curvature. ROM normal. No CVA tenderness. Cardio: regular rate and rhythm, S1, S2 normal, no murmur, click, rub or gallop GI: soft, non-tender; bowel sounds normal; no masses,  no organomegaly Extremities: extremities normal, atraumatic, no cyanosis or edema  ECOG PERFORMANCE STATUS: 1 - Symptomatic but completely ambulatory  Blood pressure 125/64, pulse 85, temperature 98.5 F (36.9 C), temperature source Oral, resp. rate 18, height 4\' 11"  (1.499 m), weight 130 lb 1.6 oz (59 kg), SpO2 97 %.  LABORATORY DATA: Lab Results  Component Value Date   WBC 5.5 07/12/2017   HGB 15.0 03/16/2017   HCT 37.8 07/12/2017   MCV 95.1 07/12/2017   PLT 219 07/12/2017      Chemistry      Component Value Date/Time   NA 139 07/05/2017 1257   K 4.5 07/05/2017 1257   CL 106 07/05/2017 1257   CO2 24 07/05/2017 1257   BUN 16 07/05/2017 1257   CREATININE 0.78 07/05/2017 1257      Component Value Date/Time   CALCIUM 10.5 (H) 07/05/2017 1257   ALKPHOS 81 07/05/2017 1257   AST 18 07/05/2017 1257   ALT 18 07/05/2017 1257   BILITOT 0.6 07/05/2017 1257       RADIOGRAPHIC STUDIES: No results found.  ASSESSMENT AND PLAN: This is a very pleasant 72 years old white female with suspicious for stage IV non-small cell lung cancer, adenocarcinoma presented mainly  with right lower lobe lung mass in addition to subcarinal lymphadenopathy but there was suspicious metastatic pulmonary nodules. She is currently undergoing a course of concurrent chemoradiation with weekly carboplatin and paclitaxel status post 2 cycles.  She has been tolerating this treatment fairly well with no concerning complaints. I recommended for her to proceed with cycle #3 today as a scheduled. I will see her back for follow-up visit in 2 weeks for evaluation before starting cycle #5. For the insomnia, she will try Tylenol PM and she would resume her treatment with Restoril if needed. She was advised to call immediately if she has any concerning symptoms in the interval. The patient voices understanding of current disease status and treatment options and is in agreement with the current care plan.  All questions were answered. The patient knows to call the clinic with any problems, questions or concerns. We can certainly see the patient much sooner if necessary.  I spent 10 minutes counseling the patient face to face.  The total time spent in the appointment was 15 minutes.  Disclaimer: This note was dictated with voice recognition software. Similar sounding words can inadvertently be transcribed and may not be corrected upon review.

## 2017-07-13 ENCOUNTER — Ambulatory Visit
Admission: RE | Admit: 2017-07-13 | Discharge: 2017-07-13 | Disposition: A | Discharge status: Home / Self Care | Payer: PPO | Source: Ambulatory Visit | Attending: Radiation Oncology | Admitting: Radiation Oncology

## 2017-07-13 ENCOUNTER — Telehealth: Payer: Self-pay | Admitting: Medical Oncology

## 2017-07-13 DIAGNOSIS — Z510 Encounter for antineoplastic radiation therapy: Secondary | ICD-10-CM | POA: Diagnosis not present

## 2017-07-13 DIAGNOSIS — C3431 Malignant neoplasm of lower lobe, right bronchus or lung: Secondary | ICD-10-CM | POA: Diagnosis not present

## 2017-07-13 NOTE — Telephone Encounter (Signed)
Refilled per Dr Lisbeth Renshaw.

## 2017-07-14 ENCOUNTER — Ambulatory Visit
Admission: RE | Admit: 2017-07-14 | Discharge: 2017-07-14 | Disposition: A | Discharge status: Home / Self Care | Payer: PPO | Source: Ambulatory Visit | Attending: Radiation Oncology | Admitting: Radiation Oncology

## 2017-07-14 DIAGNOSIS — C3431 Malignant neoplasm of lower lobe, right bronchus or lung: Secondary | ICD-10-CM | POA: Diagnosis not present

## 2017-07-14 DIAGNOSIS — Z510 Encounter for antineoplastic radiation therapy: Secondary | ICD-10-CM | POA: Diagnosis not present

## 2017-07-15 ENCOUNTER — Ambulatory Visit
Admission: RE | Admit: 2017-07-15 | Discharge: 2017-07-15 | Disposition: A | Discharge status: Home / Self Care | Payer: PPO | Source: Ambulatory Visit | Attending: Radiation Oncology | Admitting: Radiation Oncology

## 2017-07-15 DIAGNOSIS — C3431 Malignant neoplasm of lower lobe, right bronchus or lung: Secondary | ICD-10-CM | POA: Diagnosis not present

## 2017-07-15 DIAGNOSIS — Z510 Encounter for antineoplastic radiation therapy: Secondary | ICD-10-CM | POA: Diagnosis not present

## 2017-07-16 ENCOUNTER — Ambulatory Visit
Admission: RE | Admit: 2017-07-16 | Discharge: 2017-07-16 | Disposition: A | Discharge status: Home / Self Care | Payer: PPO | Source: Ambulatory Visit | Attending: Radiation Oncology | Admitting: Radiation Oncology

## 2017-07-16 DIAGNOSIS — C3431 Malignant neoplasm of lower lobe, right bronchus or lung: Secondary | ICD-10-CM | POA: Diagnosis not present

## 2017-07-16 DIAGNOSIS — Z510 Encounter for antineoplastic radiation therapy: Secondary | ICD-10-CM | POA: Diagnosis not present

## 2017-07-19 ENCOUNTER — Ambulatory Visit
Admission: RE | Admit: 2017-07-19 | Discharge: 2017-07-19 | Disposition: A | Discharge status: Home / Self Care | Payer: PPO | Source: Ambulatory Visit | Attending: Radiation Oncology | Admitting: Radiation Oncology

## 2017-07-19 ENCOUNTER — Inpatient Hospital Stay: Payer: PPO

## 2017-07-19 VITALS — BP 135/76 | HR 81 | Temp 98.4°F | Resp 16

## 2017-07-19 DIAGNOSIS — Z510 Encounter for antineoplastic radiation therapy: Secondary | ICD-10-CM | POA: Diagnosis not present

## 2017-07-19 DIAGNOSIS — C3431 Malignant neoplasm of lower lobe, right bronchus or lung: Secondary | ICD-10-CM

## 2017-07-19 DIAGNOSIS — Z5111 Encounter for antineoplastic chemotherapy: Secondary | ICD-10-CM | POA: Diagnosis not present

## 2017-07-19 LAB — CBC WITH DIFFERENTIAL (CANCER CENTER ONLY)
Basophils Absolute: 0 10*3/uL (ref 0.0–0.1)
Basophils Relative: 1 %
Eosinophils Absolute: 0 10*3/uL (ref 0.0–0.5)
Eosinophils Relative: 1 %
HCT: 38.6 % (ref 34.8–46.6)
Hemoglobin: 13 g/dL (ref 11.6–15.9)
Lymphocytes Relative: 12 %
Lymphs Abs: 0.6 10*3/uL — ABNORMAL LOW (ref 0.9–3.3)
MCH: 32 pg (ref 25.1–34.0)
MCHC: 33.6 g/dL (ref 31.5–36.0)
MCV: 95.1 fL (ref 79.5–101.0)
Monocytes Absolute: 0.5 10*3/uL (ref 0.1–0.9)
Monocytes Relative: 10 %
Neutro Abs: 3.7 10*3/uL (ref 1.5–6.5)
Neutrophils Relative %: 76 %
Platelet Count: 220 10*3/uL (ref 145–400)
RBC: 4.05 MIL/uL (ref 3.70–5.45)
RDW: 13.4 % (ref 11.2–14.5)
WBC Count: 4.9 10*3/uL (ref 3.9–10.3)

## 2017-07-19 LAB — CMP (CANCER CENTER ONLY)
ALT: 26 U/L (ref 0–55)
AST: 20 U/L (ref 5–34)
Albumin: 4 g/dL (ref 3.5–5.0)
Alkaline Phosphatase: 79 U/L (ref 40–150)
Anion gap: 9 (ref 3–11)
BUN: 15 mg/dL (ref 7–26)
CO2: 21 mmol/L — ABNORMAL LOW (ref 22–29)
Calcium: 9.9 mg/dL (ref 8.4–10.4)
Chloride: 107 mmol/L (ref 98–109)
Creatinine: 0.75 mg/dL (ref 0.60–1.10)
GFR, Est AFR Am: >60 mL/min (ref >60)
GFR, Estimated: >60 mL/min (ref >60)
Glucose, Bld: 99 mg/dL (ref 70–140)
Potassium: 4.4 mmol/L (ref 3.5–5.1)
Sodium: 137 mmol/L (ref 136–145)
Total Bilirubin: 0.4 mg/dL (ref 0.2–1.2)
Total Protein: 7 g/dL (ref 6.4–8.3)

## 2017-07-19 LAB — RESEARCH LABS

## 2017-07-19 MED ORDER — PALONOSETRON HCL INJECTION 0.25 MG/5ML
0.2500 mg | Freq: Once | INTRAVENOUS | Status: AC
  Administered 2017-07-19: 0.25 mg via INTRAVENOUS

## 2017-07-19 MED ORDER — FAMOTIDINE IN NACL 20-0.9 MG/50ML-% IV SOLN
INTRAVENOUS | Status: AC
  Filled 2017-07-19: qty 50

## 2017-07-19 MED ORDER — DIPHENHYDRAMINE HCL 50 MG/ML IJ SOLN
50.0000 mg | Freq: Once | INTRAMUSCULAR | Status: AC
  Administered 2017-07-19: 50 mg via INTRAVENOUS

## 2017-07-19 MED ORDER — PALONOSETRON HCL INJECTION 0.25 MG/5ML
INTRAVENOUS | Status: AC
  Filled 2017-07-19: qty 5

## 2017-07-19 MED ORDER — FAMOTIDINE IN NACL 20-0.9 MG/50ML-% IV SOLN
20.0000 mg | Freq: Once | INTRAVENOUS | Status: AC
  Administered 2017-07-19: 20 mg via INTRAVENOUS

## 2017-07-19 MED ORDER — SODIUM CHLORIDE 0.9 % IV SOLN
20.0000 mg | Freq: Once | INTRAVENOUS | Status: AC
  Administered 2017-07-19: 20 mg via INTRAVENOUS
  Filled 2017-07-19: qty 2

## 2017-07-19 MED ORDER — DIPHENHYDRAMINE HCL 50 MG/ML IJ SOLN
INTRAMUSCULAR | Status: AC
  Filled 2017-07-19: qty 1

## 2017-07-19 MED ORDER — SODIUM CHLORIDE 0.9 % IV SOLN
Freq: Once | INTRAVENOUS | Status: AC
  Administered 2017-07-19: 10:00:00 via INTRAVENOUS

## 2017-07-19 MED ORDER — SODIUM CHLORIDE 0.9 % IV SOLN
142.2000 mg | Freq: Once | INTRAVENOUS | Status: AC
  Administered 2017-07-19: 140 mg via INTRAVENOUS
  Filled 2017-07-19: qty 14

## 2017-07-19 MED ORDER — SODIUM CHLORIDE 0.9 % IV SOLN
45.0000 mg/m2 | Freq: Once | INTRAVENOUS | Status: AC
  Administered 2017-07-19: 72 mg via INTRAVENOUS
  Filled 2017-07-19: qty 12

## 2017-07-19 NOTE — Patient Instructions (Signed)
   [facility] Discharge Instructions for Patients Receiving Chemotherapy  Today you received the following chemotherapy agents Taxol and Carboplatin   To help prevent nausea and vomiting after your treatment, we encourage you to take your nausea medication as directed.    If you develop nausea and vomiting that is not controlled by your nausea medication, call the clinic.   BELOW ARE SYMPTOMS THAT SHOULD BE REPORTED IMMEDIATELY:  *FEVER GREATER THAN 100.5 F  *CHILLS WITH OR WITHOUT FEVER  NAUSEA AND VOMITING THAT IS NOT CONTROLLED WITH YOUR NAUSEA MEDICATION  *UNUSUAL SHORTNESS OF BREATH  *UNUSUAL BRUISING OR BLEEDING  TENDERNESS IN MOUTH AND THROAT WITH OR WITHOUT PRESENCE OF ULCERS  *URINARY PROBLEMS  *BOWEL PROBLEMS  UNUSUAL RASH Items with * indicate a potential emergency and should be followed up as soon as possible.  Feel free to call the clinic should you have any questions or concerns. The clinic phone number is [phone].  Please show the CHEMO ALERT CARD at check-in to the Emergency Department and triage nurse.   

## 2017-07-20 ENCOUNTER — Ambulatory Visit
Admission: RE | Admit: 2017-07-20 | Discharge: 2017-07-20 | Disposition: A | Discharge status: Home / Self Care | Payer: PPO | Source: Ambulatory Visit | Attending: Radiation Oncology | Admitting: Radiation Oncology

## 2017-07-20 DIAGNOSIS — C3431 Malignant neoplasm of lower lobe, right bronchus or lung: Secondary | ICD-10-CM | POA: Diagnosis not present

## 2017-07-20 DIAGNOSIS — Z510 Encounter for antineoplastic radiation therapy: Secondary | ICD-10-CM | POA: Diagnosis not present

## 2017-07-21 ENCOUNTER — Ambulatory Visit
Admission: RE | Admit: 2017-07-21 | Discharge: 2017-07-21 | Disposition: A | Discharge status: Home / Self Care | Payer: PPO | Source: Ambulatory Visit | Attending: Radiation Oncology | Admitting: Radiation Oncology

## 2017-07-21 DIAGNOSIS — Z510 Encounter for antineoplastic radiation therapy: Secondary | ICD-10-CM | POA: Diagnosis not present

## 2017-07-21 DIAGNOSIS — C3431 Malignant neoplasm of lower lobe, right bronchus or lung: Secondary | ICD-10-CM | POA: Diagnosis not present

## 2017-07-22 ENCOUNTER — Ambulatory Visit
Admission: RE | Admit: 2017-07-22 | Discharge: 2017-07-22 | Disposition: A | Discharge status: Home / Self Care | Payer: PPO | Source: Ambulatory Visit | Attending: Radiation Oncology | Admitting: Radiation Oncology

## 2017-07-22 DIAGNOSIS — Z510 Encounter for antineoplastic radiation therapy: Secondary | ICD-10-CM | POA: Diagnosis not present

## 2017-07-22 DIAGNOSIS — C3431 Malignant neoplasm of lower lobe, right bronchus or lung: Secondary | ICD-10-CM | POA: Diagnosis not present

## 2017-07-23 ENCOUNTER — Ambulatory Visit
Admission: RE | Admit: 2017-07-23 | Discharge: 2017-07-23 | Disposition: A | Discharge status: Home / Self Care | Payer: PPO | Source: Ambulatory Visit | Attending: Radiation Oncology | Admitting: Radiation Oncology

## 2017-07-23 DIAGNOSIS — Z510 Encounter for antineoplastic radiation therapy: Secondary | ICD-10-CM | POA: Diagnosis not present

## 2017-07-23 DIAGNOSIS — C3431 Malignant neoplasm of lower lobe, right bronchus or lung: Secondary | ICD-10-CM | POA: Diagnosis not present

## 2017-07-26 ENCOUNTER — Encounter: Payer: Self-pay | Admitting: Oncology

## 2017-07-26 ENCOUNTER — Inpatient Hospital Stay: Payer: PPO

## 2017-07-26 ENCOUNTER — Inpatient Hospital Stay (HOSPITAL_BASED_OUTPATIENT_CLINIC_OR_DEPARTMENT_OTHER): Payer: PPO | Admitting: Oncology

## 2017-07-26 ENCOUNTER — Ambulatory Visit
Admission: RE | Admit: 2017-07-26 | Discharge: 2017-07-26 | Disposition: A | Discharge status: Home / Self Care | Payer: PPO | Source: Ambulatory Visit | Attending: Radiation Oncology | Admitting: Radiation Oncology

## 2017-07-26 ENCOUNTER — Telehealth: Payer: Self-pay | Admitting: Internal Medicine

## 2017-07-26 VITALS — BP 135/68 | HR 86 | Temp 98.3°F | Resp 18 | Ht 59.0 in | Wt 127.3 lb

## 2017-07-26 DIAGNOSIS — C3431 Malignant neoplasm of lower lobe, right bronchus or lung: Secondary | ICD-10-CM

## 2017-07-26 DIAGNOSIS — Z5111 Encounter for antineoplastic chemotherapy: Secondary | ICD-10-CM

## 2017-07-26 DIAGNOSIS — R1310 Dysphagia, unspecified: Secondary | ICD-10-CM | POA: Diagnosis not present

## 2017-07-26 DIAGNOSIS — Z79899 Other long term (current) drug therapy: Secondary | ICD-10-CM

## 2017-07-26 DIAGNOSIS — F17210 Nicotine dependence, cigarettes, uncomplicated: Secondary | ICD-10-CM | POA: Diagnosis not present

## 2017-07-26 DIAGNOSIS — M129 Arthropathy, unspecified: Secondary | ICD-10-CM

## 2017-07-26 DIAGNOSIS — G4700 Insomnia, unspecified: Secondary | ICD-10-CM

## 2017-07-26 DIAGNOSIS — R0602 Shortness of breath: Secondary | ICD-10-CM

## 2017-07-26 DIAGNOSIS — R918 Other nonspecific abnormal finding of lung field: Secondary | ICD-10-CM | POA: Diagnosis not present

## 2017-07-26 DIAGNOSIS — E119 Type 2 diabetes mellitus without complications: Secondary | ICD-10-CM

## 2017-07-26 DIAGNOSIS — R599 Enlarged lymph nodes, unspecified: Secondary | ICD-10-CM

## 2017-07-26 DIAGNOSIS — Z510 Encounter for antineoplastic radiation therapy: Secondary | ICD-10-CM | POA: Diagnosis not present

## 2017-07-26 LAB — CMP (CANCER CENTER ONLY)
ALT: 17 U/L (ref 0–55)
AST: 15 U/L (ref 5–34)
Albumin: 3.8 g/dL (ref 3.5–5.0)
Alkaline Phosphatase: 83 U/L (ref 40–150)
Anion gap: 8 (ref 3–11)
BUN: 11 mg/dL (ref 7–26)
CO2: 23 mmol/L (ref 22–29)
Calcium: 10 mg/dL (ref 8.4–10.4)
Chloride: 109 mmol/L (ref 98–109)
Creatinine: 0.77 mg/dL (ref 0.60–1.10)
GFR, Est AFR Am: >60 mL/min (ref >60)
GFR, Estimated: >60 mL/min (ref >60)
Glucose, Bld: 100 mg/dL (ref 70–140)
Potassium: 4.3 mmol/L (ref 3.5–5.1)
Sodium: 140 mmol/L (ref 136–145)
Total Bilirubin: 0.4 mg/dL (ref 0.2–1.2)
Total Protein: 6.9 g/dL (ref 6.4–8.3)

## 2017-07-26 LAB — CBC WITH DIFFERENTIAL (CANCER CENTER ONLY)
Basophils Absolute: 0 10*3/uL (ref 0.0–0.1)
Basophils Relative: 0 %
Eosinophils Absolute: 0 10*3/uL (ref 0.0–0.5)
Eosinophils Relative: 1 %
HCT: 36.1 % (ref 34.8–46.6)
Hemoglobin: 12.3 g/dL (ref 11.6–15.9)
Lymphocytes Relative: 14 %
Lymphs Abs: 0.6 10*3/uL — ABNORMAL LOW (ref 0.9–3.3)
MCH: 32.5 pg (ref 25.1–34.0)
MCHC: 34.1 g/dL (ref 31.5–36.0)
MCV: 95.5 fL (ref 79.5–101.0)
Monocytes Absolute: 0.7 10*3/uL (ref 0.1–0.9)
Monocytes Relative: 14 %
Neutro Abs: 3.2 10*3/uL (ref 1.5–6.5)
Neutrophils Relative %: 71 %
Platelet Count: 172 10*3/uL (ref 145–400)
RBC: 3.78 MIL/uL (ref 3.70–5.45)
RDW: 13.5 % (ref 11.2–14.5)
WBC Count: 4.6 10*3/uL (ref 3.9–10.3)

## 2017-07-26 MED ORDER — FAMOTIDINE IN NACL 20-0.9 MG/50ML-% IV SOLN
20.0000 mg | Freq: Once | INTRAVENOUS | Status: AC
  Administered 2017-07-26: 20 mg via INTRAVENOUS

## 2017-07-26 MED ORDER — FAMOTIDINE IN NACL 20-0.9 MG/50ML-% IV SOLN
INTRAVENOUS | Status: AC
  Filled 2017-07-26: qty 50

## 2017-07-26 MED ORDER — PALONOSETRON HCL INJECTION 0.25 MG/5ML
INTRAVENOUS | Status: AC
  Filled 2017-07-26: qty 5

## 2017-07-26 MED ORDER — SODIUM CHLORIDE 0.9 % IV SOLN: Freq: Once | INTRAVENOUS | Status: DC

## 2017-07-26 MED ORDER — SODIUM CHLORIDE 0.9 % IV SOLN
20.0000 mg | Freq: Once | INTRAVENOUS | Status: AC
  Administered 2017-07-26: 20 mg via INTRAVENOUS
  Filled 2017-07-26: qty 2

## 2017-07-26 MED ORDER — DIPHENHYDRAMINE HCL 50 MG/ML IJ SOLN
INTRAMUSCULAR | Status: AC
  Filled 2017-07-26: qty 1

## 2017-07-26 MED ORDER — DIPHENHYDRAMINE HCL 50 MG/ML IJ SOLN
50.0000 mg | Freq: Once | INTRAMUSCULAR | Status: AC
  Administered 2017-07-26: 50 mg via INTRAVENOUS

## 2017-07-26 MED ORDER — PALONOSETRON HCL INJECTION 0.25 MG/5ML
0.2500 mg | Freq: Once | INTRAVENOUS | Status: AC
Start: 2017-07-26 — End: 2017-07-26
  Administered 2017-07-26: 0.25 mg via INTRAVENOUS

## 2017-07-26 MED ORDER — SODIUM CHLORIDE 0.9 % IV SOLN
142.2000 mg | Freq: Once | INTRAVENOUS | Status: AC
  Administered 2017-07-26: 140 mg via INTRAVENOUS
  Filled 2017-07-26: qty 14

## 2017-07-26 MED ORDER — PACLITAXEL CHEMO INJECTION 300 MG/50ML
45.0000 mg/m2 | Freq: Once | INTRAVENOUS | Status: AC
  Administered 2017-07-26: 72 mg via INTRAVENOUS
  Filled 2017-07-26: qty 12

## 2017-07-26 NOTE — Assessment & Plan Note (Signed)
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 She is currently undergoing a course of concurrent chemoradiation with weekly carboplatin and paclitaxel status post 4 cycles.  She has been tolerating this treatment fairly well with the exception of mild odynophagia. I recommended for her to proceed with cycle #5 today as a scheduled. I will see her back for follow-up visit in 2 weeks for evaluation before starting cycle #7.  For the mild odynophagia, she will continue Carafate.  She was advised to call immediately if she has any concerning symptoms in the interval. The patient voices understanding of current disease status and treatment options and is in agreement with the current care plan.  All questions were answered. The patient knows to call the clinic with any problems, questions or concerns. We can certainly see the patient much sooner if necessary.

## 2017-07-26 NOTE — Patient Instructions (Signed)
   [facility] Discharge Instructions for Patients Receiving Chemotherapy  Today you received the following chemotherapy agents Taxol and Carboplatin   To help prevent nausea and vomiting after your treatment, we encourage you to take your nausea medication as directed.    If you develop nausea and vomiting that is not controlled by your nausea medication, call the clinic.   BELOW ARE SYMPTOMS THAT SHOULD BE REPORTED IMMEDIATELY:  *FEVER GREATER THAN 100.5 F  *CHILLS WITH OR WITHOUT FEVER  NAUSEA AND VOMITING THAT IS NOT CONTROLLED WITH YOUR NAUSEA MEDICATION  *UNUSUAL SHORTNESS OF BREATH  *UNUSUAL BRUISING OR BLEEDING  TENDERNESS IN MOUTH AND THROAT WITH OR WITHOUT PRESENCE OF ULCERS  *URINARY PROBLEMS  *BOWEL PROBLEMS  UNUSUAL RASH Items with * indicate a potential emergency and should be followed up as soon as possible.  Feel free to call the clinic should you have any questions or concerns. The clinic phone number is [phone].  Please show the CHEMO ALERT CARD at check-in to the Emergency Department and triage nurse.   

## 2017-07-26 NOTE — Telephone Encounter (Signed)
Patient already on schedule as requested per 4/15 los. No additional dates in care plan beyond 4/29.

## 2017-07-26 NOTE — Progress Notes (Signed)
[place] OFFICE PROGRESS NOTE  [name], MD [place] [address]  DIAGNOSIS: Questionable stage IV (T2a,N2, M1a) non-small cell lung cancer,adenocarcinomapresenting with right lower lobe lung mass in addition to subcarinal lymphadenopathy and suspicious metastatic pulmonary nodules.  PRIOR THERAPY: None  CURRENT THERAPY: Concurrent chemoradiation with carboplatin for an AUC of 2 with paclitaxel 45 mg/m weekly. First dosegiven on3/18/2019.  Status post 4 cycles.  INTERVAL HISTORY: Katelyn Kennedy 72 y.o. female returns for routine follow-up visit by herself.  The patient is feeling fine today with no specific complaints except for mild odynophagia.  She uses Carafate which she finds to be effective.  The patient denies fevers and chills.  Denies chest pain, shortness breath, cough, hemoptysis.  Denies nausea, vomiting, constipation, diarrhea.  She continues to tolerate her treatment with concurrent chemoradiation fairly well.  Patient is here for evaluation prior to starting cycle #5.  MEDICAL HISTORY: Past Medical History:  Diagnosis Date  . Allergic rhinitis   . Arthritis   . Diabetes mellitus without complication (HCC)    diet control/no meds per pt  . Dyspnea   . History of chicken pox   . Smoker     ALLERGIES:  is allergic to codeine.  MEDICATIONS:  Current Outpatient Medications  Medication Sig Dispense Refill  . acetaminophen (TYLENOL) 500 MG tablet Take 500 mg by mouth daily as needed for moderate pain or headache.    Marland Kitchen atorvastatin (LIPITOR) 20 MG tablet TAKE 1 TABLET BY MOUTH DAILY (Patient taking differently: Take 20 mg by mouth daily) 90 tablet 3  . Calcium Carbonate-Vitamin D (CALCIUM + D) 600-200 MG-UNIT per tablet Take 1 tablet by mouth daily.     . fluticasone (FLONASE) 50 MCG/ACT nasal spray PLACE 2 SPRAYS INTO THE NOSE DAILY. (Patient taking differently: PLACE 2 SPRAYS INTO THE NOSE ONCE DAILY AS NEEDED FOR  ALLERGIES) 16 g 5  . lisinopril (PRINIVIL,ZESTRIL) 5 MG tablet Take 0.5 tablets (2.5 mg total) by mouth daily. 45 tablet 3  . Multiple Vitamin (MULTIVITAMIN) capsule Take 1 capsule by mouth daily.      . temazepam (RESTORIL) 15 MG capsule Take 1 capsule (15 mg total) by mouth at bedtime as needed for sleep. 30 capsule 0  . Tetrahydrozoline HCl (VISINE OP) Place 1 drop into both eyes daily as needed (for dry eyes).     . traMADol (ULTRAM) 50 MG tablet Take 1 tablet (50 mg total) by mouth every 6 (six) hours as needed for moderate pain. 15 tablet 0  . prochlorperazine (COMPAZINE) 10 MG tablet Take 1 tablet (10 mg total) by mouth every 6 (six) hours as needed for nausea or vomiting. (Patient not taking: Reported on 07/26/2017) 30 tablet 0  . sucralfate (CARAFATE) 1 g tablet Take 1 tablet (1 g total) by mouth 4 (four) times daily. (Patient not taking: Reported on 07/12/2017) 120 tablet 2   No current facility-administered medications for this visit.    Facility-Administered Medications Ordered in Other Visits  Medication Dose Route Frequency Provider Last Rate Last Dose  . 0.9 %  sodium chloride infusion   Intravenous Once Curt Bears, MD      . CARBOplatin (PARAPLATIN) 140 mg in sodium chloride 0.9 % 100 mL chemo infusion  140 mg Intravenous Once Curt Bears, MD      . dexamethasone (DECADRON) 20 mg in sodium chloride 0.9 % 50 mL IVPB  20 mg Intravenous Once Curt Bears, MD      .  diphenhydrAMINE (BENADRYL) injection 50 mg  50 mg Intravenous Once Curt Bears, MD      . famotidine (PEPCID) IVPB 20 mg premix  20 mg Intravenous Once Curt Bears, MD      . PACLitaxel (TAXOL) 72 mg in dextrose 5 % 250 mL chemo infusion (</= 80mg /m2)  45 mg/m2 (Treatment Plan Recorded) Intravenous Once Curt Bears, MD      . palonosetron (ALOXI) injection 0.25 mg  0.25 mg Intravenous Once Curt Bears, MD        SURGICAL HISTORY:  Past Surgical History:  Procedure Laterality Date  .  ANKLE FRACTURE SURGERY Left 10 years ago   "hard to wake up from anesthesia" per pt  . [place]  . BREAST BIOPSY Right 1978   BENIGN CYST  . BREAST EXCISIONAL BIOPSY Left 2011   Benign  . BREAST EXCISIONAL BIOPSY Right 1985   Benign   . CATARACT EXTRACTION    . CATARACT EXTRACTION W/ INTRAOCULAR LENS IMPLANT Bilateral   . COLONOSCOPY  2011  . ELECTROMAGNETIC NAVIGATION BROCHOSCOPY N/A 06/08/2017   Procedure: ELECTROMAGNETIC NAVIGATION BRONCHOSCOPY;  Surgeon: Flora Lipps, MD;  Location: [facility] ORS;  Service: Cardiopulmonary;  Laterality: N/A;  . POLYPECTOMY  2011  . 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 TUBAL LIGATION      REVIEW OF SYSTEMS:   Review of Systems  Constitutional: Negative for appetite change, chills, fatigue, fever and unexpected weight change.  HENT:   Negative for mouth sores, nosebleeds, sore throat.   Eyes: Negative for eye problems and icterus.  Respiratory: Negative for cough, hemoptysis, shortness of breath and wheezing.   Cardiovascular: Negative for chest pain and leg swelling.  Gastrointestinal: Negative for abdominal pain, constipation, diarrhea, nausea and vomiting. Positive for mild odynophagia. Genitourinary: Negative for bladder incontinence, difficulty urinating, dysuria, frequency and hematuria.   Musculoskeletal: Negative for back pain, gait problem, neck pain and neck stiffness.  Skin: Negative for itching and rash.  Neurological: Negative for dizziness, extremity weakness, gait problem, headaches, light-headedness and seizures.  Hematological: Negative for adenopathy. Does not bruise/bleed easily.  Psychiatric/Behavioral: Negative for confusion, depression and sleep disturbance. The patient is not nervous/anxious.     PHYSICAL  EXAMINATION:  Blood pressure 135/68, pulse 86, temperature 98.3 F (36.8 C), temperature source Oral, resp. rate 18, height 4\' 11"  (1.499 m), weight 127 lb 4.8 oz (57.7 kg), SpO2 99 %.  ECOG PERFORMANCE STATUS: 1 - Symptomatic but completely ambulatory  Physical Exam  Constitutional: Oriented to person, place, and time and well-developed, well-nourished, and in no distress. No distress.  HENT:  Head: Normocephalic and atraumatic.  Mouth/Throat: Oropharynx is clear and moist. No oropharyngeal exudate.  Eyes: Conjunctivae are normal. Right eye exhibits no discharge. Left eye exhibits no discharge. No scleral icterus.  Neck: Normal range of motion. Neck supple.  Cardiovascular: Normal rate, regular rhythm, normal heart sounds and intact distal pulses.   Pulmonary/Chest: Effort normal and breath sounds normal. No respiratory distress. No wheezes. No rales.  Abdominal: Soft. Bowel sounds are normal. Exhibits no distension and no mass. There is no tenderness.  Musculoskeletal: Normal range of motion. Exhibits no edema.  Lymphadenopathy:    No cervical adenopathy.  Neurological: Alert and oriented to person, place, and time. Exhibits normal muscle tone. Gait normal. Coordination normal.  Skin: Skin is  warm and dry. No rash noted. Not diaphoretic. No erythema. No pallor.  Psychiatric: Mood, memory and judgment normal.  Vitals reviewed.  LABORATORY DATA: Lab Results  Component Value Date   WBC 4.6 07/26/2017   HGB 15.0 03/16/2017   HCT 36.1 07/26/2017   MCV 95.5 07/26/2017   PLT 172 07/26/2017      Chemistry      Component Value Date/Time   NA 140 07/26/2017 0946   K 4.3 07/26/2017 0946   CL 109 07/26/2017 0946   CO2 23 07/26/2017 0946   BUN 11 07/26/2017 0946   CREATININE 0.77 07/26/2017 0946      Component Value Date/Time   CALCIUM 10.0 07/26/2017 0946   ALKPHOS 83 07/26/2017 0946   AST 15 07/26/2017 0946   ALT 17 07/26/2017 0946   BILITOT 0.4 07/26/2017 0946        RADIOGRAPHIC STUDIES:  No results found.   ASSESSMENT/PLAN:  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 ([place]) This is a very pleasant 72 year old white female with suspicious for stage IV non-small cell lung cancer, adenocarcinoma presented mainly with right lower lobe lung mass in addition to subcarinal lymphadenopathy but there was suspicious metastatic pulmonary nodules. She is currently undergoing a course of concurrent chemoradiation with weekly carboplatin and paclitaxel status post 4 cycles.  She has been tolerating this treatment fairly well with the exception of mild odynophagia. I recommended for her to proceed with cycle #5 today as a scheduled. I will see her back for follow-up visit in 2 weeks for evaluation before starting cycle #7.  For the mild odynophagia, she will continue Carafate.  She was advised to call immediately if she has any concerning symptoms in the interval. The patient voices understanding of current disease status and treatment options and is in agreement with the current care plan.  All questions were answered. The patient knows to call the clinic with any problems, questions or concerns. We can certainly see the patient much sooner if necessary.    No orders of the defined types were placed in this encounter.  Mikey Bussing, DNP, AGPCNP-BC, AOCNP 07/26/17

## 2017-07-27 ENCOUNTER — Ambulatory Visit
Admission: RE | Admit: 2017-07-27 | Discharge: 2017-07-27 | Disposition: A | Discharge status: Home / Self Care | Payer: PPO | Source: Ambulatory Visit | Attending: Radiation Oncology | Admitting: Radiation Oncology

## 2017-07-27 DIAGNOSIS — Z510 Encounter for antineoplastic radiation therapy: Secondary | ICD-10-CM | POA: Diagnosis not present

## 2017-07-27 DIAGNOSIS — C3431 Malignant neoplasm of lower lobe, right bronchus or lung: Secondary | ICD-10-CM | POA: Diagnosis not present

## 2017-07-28 ENCOUNTER — Ambulatory Visit
Admission: RE | Admit: 2017-07-28 | Discharge: 2017-07-28 | Disposition: A | Discharge status: Home / Self Care | Payer: PPO | Source: Ambulatory Visit | Attending: Radiation Oncology | Admitting: Radiation Oncology

## 2017-07-28 DIAGNOSIS — C3431 Malignant neoplasm of lower lobe, right bronchus or lung: Secondary | ICD-10-CM | POA: Diagnosis not present

## 2017-07-28 DIAGNOSIS — Z510 Encounter for antineoplastic radiation therapy: Secondary | ICD-10-CM | POA: Diagnosis not present

## 2017-07-29 ENCOUNTER — Ambulatory Visit
Admission: RE | Admit: 2017-07-29 | Discharge: 2017-07-29 | Disposition: A | Discharge status: Home / Self Care | Payer: PPO | Source: Ambulatory Visit | Attending: Radiation Oncology | Admitting: Radiation Oncology

## 2017-07-29 DIAGNOSIS — C3431 Malignant neoplasm of lower lobe, right bronchus or lung: Secondary | ICD-10-CM | POA: Diagnosis not present

## 2017-07-29 DIAGNOSIS — Z510 Encounter for antineoplastic radiation therapy: Secondary | ICD-10-CM | POA: Diagnosis not present

## 2017-07-30 ENCOUNTER — Ambulatory Visit
Admission: RE | Admit: 2017-07-30 | Discharge: 2017-07-30 | Disposition: A | Discharge status: Home / Self Care | Payer: PPO | Source: Ambulatory Visit | Attending: Radiation Oncology | Admitting: Radiation Oncology

## 2017-07-30 DIAGNOSIS — C3431 Malignant neoplasm of lower lobe, right bronchus or lung: Secondary | ICD-10-CM | POA: Diagnosis not present

## 2017-07-30 DIAGNOSIS — Z510 Encounter for antineoplastic radiation therapy: Secondary | ICD-10-CM | POA: Diagnosis not present

## 2017-08-02 ENCOUNTER — Inpatient Hospital Stay: Payer: PPO

## 2017-08-02 ENCOUNTER — Ambulatory Visit
Admission: RE | Admit: 2017-08-02 | Discharge: 2017-08-02 | Disposition: A | Discharge status: Home / Self Care | Payer: PPO | Source: Ambulatory Visit | Attending: Radiation Oncology | Admitting: Radiation Oncology

## 2017-08-02 VITALS — BP 115/54 | HR 89 | Temp 99.3°F | Resp 20 | Wt 125.5 lb

## 2017-08-02 DIAGNOSIS — C3431 Malignant neoplasm of lower lobe, right bronchus or lung: Secondary | ICD-10-CM | POA: Diagnosis not present

## 2017-08-02 DIAGNOSIS — Z510 Encounter for antineoplastic radiation therapy: Secondary | ICD-10-CM | POA: Diagnosis not present

## 2017-08-02 DIAGNOSIS — Z5111 Encounter for antineoplastic chemotherapy: Secondary | ICD-10-CM | POA: Diagnosis not present

## 2017-08-02 LAB — CMP (CANCER CENTER ONLY)
ALT: 16 U/L (ref 0–55)
AST: 16 U/L (ref 5–34)
Albumin: 3.7 g/dL (ref 3.5–5.0)
Alkaline Phosphatase: 77 U/L (ref 40–150)
Anion gap: 8 (ref 3–11)
BUN: 14 mg/dL (ref 7–26)
CO2: 22 mmol/L (ref 22–29)
Calcium: 10.2 mg/dL (ref 8.4–10.4)
Chloride: 107 mmol/L (ref 98–109)
Creatinine: 0.81 mg/dL (ref 0.60–1.10)
GFR, Est AFR Am: >60 mL/min (ref >60)
GFR, Estimated: >60 mL/min (ref >60)
Glucose, Bld: 144 mg/dL — ABNORMAL HIGH (ref 70–140)
Potassium: 4.2 mmol/L (ref 3.5–5.1)
Sodium: 137 mmol/L (ref 136–145)
Total Bilirubin: 0.5 mg/dL (ref 0.2–1.2)
Total Protein: 6.8 g/dL (ref 6.4–8.3)

## 2017-08-02 LAB — CBC WITH DIFFERENTIAL (CANCER CENTER ONLY)
Basophils Absolute: 0 10*3/uL (ref 0.0–0.1)
Basophils Relative: 0 %
Eosinophils Absolute: 0 10*3/uL (ref 0.0–0.5)
Eosinophils Relative: 1 %
HCT: 36 % (ref 34.8–46.6)
Hemoglobin: 12.3 g/dL (ref 11.6–15.9)
Lymphocytes Relative: 11 %
Lymphs Abs: 0.5 10*3/uL — ABNORMAL LOW (ref 0.9–3.3)
MCH: 32.5 pg (ref 25.1–34.0)
MCHC: 34.2 g/dL (ref 31.5–36.0)
MCV: 95.2 fL (ref 79.5–101.0)
Monocytes Absolute: 0.5 10*3/uL (ref 0.1–0.9)
Monocytes Relative: 11 %
Neutro Abs: 3.7 10*3/uL (ref 1.5–6.5)
Neutrophils Relative %: 77 %
Platelet Count: 146 10*3/uL (ref 145–400)
RBC: 3.78 MIL/uL (ref 3.70–5.45)
RDW: 13.9 % (ref 11.2–14.5)
WBC Count: 4.8 10*3/uL (ref 3.9–10.3)

## 2017-08-02 MED ORDER — PALONOSETRON HCL INJECTION 0.25 MG/5ML
0.2500 mg | Freq: Once | INTRAVENOUS | Status: AC
  Administered 2017-08-02: 0.25 mg via INTRAVENOUS

## 2017-08-02 MED ORDER — FAMOTIDINE IN NACL 20-0.9 MG/50ML-% IV SOLN
20.0000 mg | Freq: Once | INTRAVENOUS | Status: AC
  Administered 2017-08-02: 20 mg via INTRAVENOUS

## 2017-08-02 MED ORDER — SODIUM CHLORIDE 0.9 % IV SOLN
Freq: Once | INTRAVENOUS | Status: AC
Start: 2017-08-02 — End: 2017-08-02
  Administered 2017-08-02: 10:00:00 via INTRAVENOUS

## 2017-08-02 MED ORDER — DIPHENHYDRAMINE HCL 50 MG/ML IJ SOLN
50.0000 mg | Freq: Once | INTRAMUSCULAR | Status: AC
  Administered 2017-08-02: 50 mg via INTRAVENOUS

## 2017-08-02 MED ORDER — SODIUM CHLORIDE 0.9 % IV SOLN
45.0000 mg/m2 | Freq: Once | INTRAVENOUS | Status: AC
  Administered 2017-08-02: 72 mg via INTRAVENOUS
  Filled 2017-08-02: qty 12

## 2017-08-02 MED ORDER — DIPHENHYDRAMINE HCL 50 MG/ML IJ SOLN
INTRAMUSCULAR | Status: AC
  Filled 2017-08-02: qty 1

## 2017-08-02 MED ORDER — FAMOTIDINE IN NACL 20-0.9 MG/50ML-% IV SOLN
INTRAVENOUS | Status: AC
  Filled 2017-08-02: qty 50

## 2017-08-02 MED ORDER — SODIUM CHLORIDE 0.9 % IV SOLN
142.2000 mg | Freq: Once | INTRAVENOUS | Status: AC
  Administered 2017-08-02: 140 mg via INTRAVENOUS
  Filled 2017-08-02: qty 14

## 2017-08-02 MED ORDER — PALONOSETRON HCL INJECTION 0.25 MG/5ML
INTRAVENOUS | Status: AC
  Filled 2017-08-02: qty 5

## 2017-08-02 MED ORDER — SODIUM CHLORIDE 0.9 % IV SOLN
20.0000 mg | Freq: Once | INTRAVENOUS | Status: AC
  Administered 2017-08-02: 20 mg via INTRAVENOUS
  Filled 2017-08-02: qty 2

## 2017-08-02 NOTE — Patient Instructions (Signed)
[facility] [place] Discharge Instructions for Patients Receiving Chemotherapy  Today you received the following chemotherapy agents Taxol/Carboplatin  To help prevent nausea and vomiting after your treatment, we encourage you to take your nausea medication as needed   If you develop nausea and vomiting that is not controlled by your nausea medication, call the clinic.   BELOW ARE SYMPTOMS THAT SHOULD BE REPORTED IMMEDIATELY:  *FEVER GREATER THAN 100.5 F  *CHILLS WITH OR WITHOUT FEVER  NAUSEA AND VOMITING THAT IS NOT CONTROLLED WITH YOUR NAUSEA MEDICATION  *UNUSUAL SHORTNESS OF BREATH  *UNUSUAL BRUISING OR BLEEDING  TENDERNESS IN MOUTH AND THROAT WITH OR WITHOUT PRESENCE OF ULCERS  *URINARY PROBLEMS  *BOWEL PROBLEMS  UNUSUAL RASH Items with * indicate a potential emergency and should be followed up as soon as possible.  Feel free to call the clinic should you have any questions or concerns. The clinic phone number is [phone].  Please show the [place] at check-in to the Emergency Department and triage nurse.

## 2017-08-03 ENCOUNTER — Ambulatory Visit
Admission: RE | Admit: 2017-08-03 | Discharge: 2017-08-03 | Disposition: A | Discharge status: Home / Self Care | Payer: PPO | Source: Ambulatory Visit | Attending: Radiation Oncology | Admitting: Radiation Oncology

## 2017-08-03 DIAGNOSIS — Z510 Encounter for antineoplastic radiation therapy: Secondary | ICD-10-CM | POA: Diagnosis not present

## 2017-08-03 DIAGNOSIS — C3431 Malignant neoplasm of lower lobe, right bronchus or lung: Secondary | ICD-10-CM | POA: Diagnosis not present

## 2017-08-04 ENCOUNTER — Ambulatory Visit
Admission: RE | Admit: 2017-08-04 | Discharge: 2017-08-04 | Disposition: A | Discharge status: Home / Self Care | Payer: PPO | Source: Ambulatory Visit | Attending: Radiation Oncology | Admitting: Radiation Oncology

## 2017-08-04 DIAGNOSIS — C3431 Malignant neoplasm of lower lobe, right bronchus or lung: Secondary | ICD-10-CM | POA: Diagnosis not present

## 2017-08-04 DIAGNOSIS — Z510 Encounter for antineoplastic radiation therapy: Secondary | ICD-10-CM | POA: Diagnosis not present

## 2017-08-05 ENCOUNTER — Ambulatory Visit
Admission: RE | Admit: 2017-08-05 | Discharge: 2017-08-05 | Disposition: A | Discharge status: Home / Self Care | Payer: PPO | Source: Ambulatory Visit | Attending: Radiation Oncology | Admitting: Radiation Oncology

## 2017-08-05 DIAGNOSIS — C3431 Malignant neoplasm of lower lobe, right bronchus or lung: Secondary | ICD-10-CM | POA: Diagnosis not present

## 2017-08-05 DIAGNOSIS — Z510 Encounter for antineoplastic radiation therapy: Secondary | ICD-10-CM | POA: Diagnosis not present

## 2017-08-06 ENCOUNTER — Ambulatory Visit
Admission: RE | Admit: 2017-08-06 | Discharge: 2017-08-06 | Disposition: A | Discharge status: Home / Self Care | Payer: PPO | Source: Ambulatory Visit | Attending: Radiation Oncology | Admitting: Radiation Oncology

## 2017-08-06 DIAGNOSIS — C3431 Malignant neoplasm of lower lobe, right bronchus or lung: Secondary | ICD-10-CM | POA: Diagnosis not present

## 2017-08-06 DIAGNOSIS — Z510 Encounter for antineoplastic radiation therapy: Secondary | ICD-10-CM | POA: Diagnosis not present

## 2017-08-09 ENCOUNTER — Ambulatory Visit: Payer: PPO | Admitting: Internal Medicine

## 2017-08-09 ENCOUNTER — Other Ambulatory Visit: Payer: PPO

## 2017-08-09 ENCOUNTER — Inpatient Hospital Stay (HOSPITAL_BASED_OUTPATIENT_CLINIC_OR_DEPARTMENT_OTHER): Payer: PPO | Admitting: Oncology

## 2017-08-09 ENCOUNTER — Inpatient Hospital Stay: Payer: PPO

## 2017-08-09 ENCOUNTER — Ambulatory Visit
Admission: RE | Admit: 2017-08-09 | Discharge: 2017-08-09 | Disposition: A | Discharge status: Home / Self Care | Payer: PPO | Source: Ambulatory Visit | Attending: Radiation Oncology | Admitting: Radiation Oncology

## 2017-08-09 ENCOUNTER — Telehealth: Payer: Self-pay | Admitting: Oncology

## 2017-08-09 ENCOUNTER — Ambulatory Visit: Payer: PPO

## 2017-08-09 ENCOUNTER — Other Ambulatory Visit: Payer: Self-pay

## 2017-08-09 ENCOUNTER — Encounter: Payer: Self-pay | Admitting: Oncology

## 2017-08-09 VITALS — BP 126/65 | HR 110 | Temp 98.3°F | Resp 15 | Ht 59.0 in | Wt 125.1 lb

## 2017-08-09 DIAGNOSIS — M129 Arthropathy, unspecified: Secondary | ICD-10-CM | POA: Diagnosis not present

## 2017-08-09 DIAGNOSIS — F17210 Nicotine dependence, cigarettes, uncomplicated: Secondary | ICD-10-CM

## 2017-08-09 DIAGNOSIS — Z510 Encounter for antineoplastic radiation therapy: Secondary | ICD-10-CM | POA: Diagnosis not present

## 2017-08-09 DIAGNOSIS — Z5111 Encounter for antineoplastic chemotherapy: Secondary | ICD-10-CM

## 2017-08-09 DIAGNOSIS — R1310 Dysphagia, unspecified: Secondary | ICD-10-CM

## 2017-08-09 DIAGNOSIS — E119 Type 2 diabetes mellitus without complications: Secondary | ICD-10-CM

## 2017-08-09 DIAGNOSIS — R599 Enlarged lymph nodes, unspecified: Secondary | ICD-10-CM

## 2017-08-09 DIAGNOSIS — G4700 Insomnia, unspecified: Secondary | ICD-10-CM

## 2017-08-09 DIAGNOSIS — C3431 Malignant neoplasm of lower lobe, right bronchus or lung: Secondary | ICD-10-CM | POA: Diagnosis not present

## 2017-08-09 DIAGNOSIS — R918 Other nonspecific abnormal finding of lung field: Secondary | ICD-10-CM

## 2017-08-09 DIAGNOSIS — R0602 Shortness of breath: Secondary | ICD-10-CM | POA: Diagnosis not present

## 2017-08-09 DIAGNOSIS — Z79899 Other long term (current) drug therapy: Secondary | ICD-10-CM | POA: Diagnosis not present

## 2017-08-09 LAB — CBC WITH DIFFERENTIAL (CANCER CENTER ONLY)
Basophils Absolute: 0 10*3/uL (ref 0.0–0.1)
Basophils Relative: 0 %
Eosinophils Absolute: 0 10*3/uL (ref 0.0–0.5)
Eosinophils Relative: 1 %
HCT: 35.8 % (ref 34.8–46.6)
Hemoglobin: 12.4 g/dL (ref 11.6–15.9)
Lymphocytes Relative: 14 %
Lymphs Abs: 0.4 10*3/uL — ABNORMAL LOW (ref 0.9–3.3)
MCH: 32.7 pg (ref 25.1–34.0)
MCHC: 34.6 g/dL (ref 31.5–36.0)
MCV: 94.6 fL (ref 79.5–101.0)
Monocytes Absolute: 0.4 10*3/uL (ref 0.1–0.9)
Monocytes Relative: 13 %
Neutro Abs: 2.3 10*3/uL (ref 1.5–6.5)
Neutrophils Relative %: 72 %
Platelet Count: 199 10*3/uL (ref 145–400)
RBC: 3.78 MIL/uL (ref 3.70–5.45)
RDW: 14.3 % (ref 11.2–14.5)
WBC Count: 3.2 10*3/uL — ABNORMAL LOW (ref 3.9–10.3)

## 2017-08-09 LAB — CMP (CANCER CENTER ONLY)
ALT: 22 U/L (ref 0–55)
AST: 19 U/L (ref 5–34)
Albumin: 4 g/dL (ref 3.5–5.0)
Alkaline Phosphatase: 85 U/L (ref 40–150)
Anion gap: 8 (ref 3–11)
BUN: 14 mg/dL (ref 7–26)
CO2: 22 mmol/L (ref 22–29)
Calcium: 10.4 mg/dL (ref 8.4–10.4)
Chloride: 107 mmol/L (ref 98–109)
Creatinine: 0.79 mg/dL (ref 0.60–1.10)
GFR, Est AFR Am: >60 mL/min (ref >60)
GFR, Estimated: >60 mL/min (ref >60)
Glucose, Bld: 127 mg/dL (ref 70–140)
Potassium: 4.2 mmol/L (ref 3.5–5.1)
Sodium: 137 mmol/L (ref 136–145)
Total Bilirubin: 0.4 mg/dL (ref 0.2–1.2)
Total Protein: 7.2 g/dL (ref 6.4–8.3)

## 2017-08-09 MED ORDER — SODIUM CHLORIDE 0.9 % IV SOLN
45.0000 mg/m2 | Freq: Once | INTRAVENOUS | Status: AC
  Administered 2017-08-09: 72 mg via INTRAVENOUS
  Filled 2017-08-09: qty 12

## 2017-08-09 MED ORDER — SODIUM CHLORIDE 0.9 % IV SOLN
Freq: Once | INTRAVENOUS | Status: AC
  Administered 2017-08-09: 11:00:00 via INTRAVENOUS

## 2017-08-09 MED ORDER — FAMOTIDINE IN NACL 20-0.9 MG/50ML-% IV SOLN
INTRAVENOUS | Status: AC
  Filled 2017-08-09: qty 50

## 2017-08-09 MED ORDER — PALONOSETRON HCL INJECTION 0.25 MG/5ML
INTRAVENOUS | Status: AC
  Filled 2017-08-09: qty 5

## 2017-08-09 MED ORDER — DIPHENHYDRAMINE HCL 50 MG/ML IJ SOLN
INTRAMUSCULAR | Status: AC
  Filled 2017-08-09: qty 1

## 2017-08-09 MED ORDER — DIPHENHYDRAMINE HCL 50 MG/ML IJ SOLN
50.0000 mg | Freq: Once | INTRAMUSCULAR | Status: AC
  Administered 2017-08-09: 50 mg via INTRAVENOUS

## 2017-08-09 MED ORDER — PALONOSETRON HCL INJECTION 0.25 MG/5ML
0.2500 mg | Freq: Once | INTRAVENOUS | Status: AC
  Administered 2017-08-09: 0.25 mg via INTRAVENOUS

## 2017-08-09 MED ORDER — SODIUM CHLORIDE 0.9 % IV SOLN
20.0000 mg | Freq: Once | INTRAVENOUS | Status: AC
  Administered 2017-08-09: 20 mg via INTRAVENOUS
  Filled 2017-08-09: qty 2

## 2017-08-09 MED ORDER — FAMOTIDINE IN NACL 20-0.9 MG/50ML-% IV SOLN
20.0000 mg | Freq: Once | INTRAVENOUS | Status: AC
  Administered 2017-08-09: 20 mg via INTRAVENOUS

## 2017-08-09 MED ORDER — SODIUM CHLORIDE 0.9 % IV SOLN
142.2000 mg | Freq: Once | INTRAVENOUS | Status: AC
  Administered 2017-08-09: 140 mg via INTRAVENOUS
  Filled 2017-08-09: qty 14

## 2017-08-09 NOTE — Telephone Encounter (Signed)
Scheduled appt per 4/29 los - sent reminder letter in the mail with appt date and time. Central radiology to contact pt with ct scan .

## 2017-08-09 NOTE — Patient Instructions (Signed)
[facility] Discharge Instructions for Patients Receiving Chemotherapy  Today you received the following chemotherapy agents Taxol, Carboplatin  To help prevent nausea and vomiting after your treatment, we encourage you to take your nausea medication as directed  If you develop nausea and vomiting that is not controlled by your nausea medication, call the clinic.   BELOW ARE SYMPTOMS THAT SHOULD BE REPORTED IMMEDIATELY:  *FEVER GREATER THAN 100.5 F  *CHILLS WITH OR WITHOUT FEVER  NAUSEA AND VOMITING THAT IS NOT CONTROLLED WITH YOUR NAUSEA MEDICATION  *UNUSUAL SHORTNESS OF BREATH  *UNUSUAL BRUISING OR BLEEDING  TENDERNESS IN MOUTH AND THROAT WITH OR WITHOUT PRESENCE OF ULCERS  *URINARY PROBLEMS  *BOWEL PROBLEMS  UNUSUAL RASH Items with * indicate a potential emergency and should be followed up as soon as possible.  Feel free to call the clinic should you have any questions or concerns. The clinic phone number is [phone].  Please show the CHEMO ALERT CARD at check-in to the Emergency Department and triage nurse.   

## 2017-08-09 NOTE — Progress Notes (Signed)
[place] OFFICE PROGRESS NOTE  Katelyn Sanders, MD [place] [address]  DIAGNOSIS: Questionablestage IV (T2a,N2, M1a) non-small cell lung cancer,adenocarcinomapresenting with right lower lobe lung mass in addition to subcarinal lymphadenopathy and suspicious metastatic pulmonary nodules.  PRIOR THERAPY: None  CURRENT THERAPY: Concurrent chemoradiation with carboplatin for an AUC of 2 with paclitaxel 45 mg/m weekly. First dosegiven on3/18/2019.Status post 6 cycles.  INTERVAL HISTORY: Katelyn Kennedy 72 y.o. female returns for routine follow-up visit by herself.  Patient is feeling fine today and has no specific complaints.  She continues to tolerate her treatment with concurrent chemoradiation fairly well.  She reports mild odynophagia and uses Carafate.  The patient denies fevers and chills.  Denies chest pain, shortness breath, cough, hemoptysis.  Denies nausea, vomiting, constipation, diarrhea.  She reports itching and soreness to her skin at the site of radiation.  She has noticed a more prominent mole to her right upper back at the site of radiation.  She has not been putting any cream on this.  She is here for evaluation prior to cycle #7 of her treatment.  MEDICAL HISTORY: Past Medical History:  Diagnosis Date  . Allergic rhinitis   . Arthritis   . Diabetes mellitus without complication (HCC)    diet control/no meds per pt  . Dyspnea   . History of chicken pox   . Smoker     ALLERGIES:  is allergic to codeine.  MEDICATIONS:  Current Outpatient Medications  Medication Sig Dispense Refill  . acetaminophen (TYLENOL) 500 MG tablet Take 500 mg by mouth daily as needed for moderate pain or headache.    Marland Kitchen atorvastatin (LIPITOR) 20 MG tablet TAKE 1 TABLET BY MOUTH DAILY (Patient taking differently: Take 20 mg by mouth daily) 90 tablet 3  . Calcium Carbonate-Vitamin D (CALCIUM + D) 600-200 MG-UNIT per tablet Take 1 tablet by mouth  daily.     Marland Kitchen lisinopril (PRINIVIL,ZESTRIL) 5 MG tablet Take 0.5 tablets (2.5 mg total) by mouth daily. 45 tablet 3  . Multiple Vitamin (MULTIVITAMIN) capsule Take 1 capsule by mouth daily.      . sucralfate (CARAFATE) 1 g tablet Take 1 tablet (1 g total) by mouth 4 (four) times daily. 120 tablet 2  . temazepam (RESTORIL) 15 MG capsule Take 1 capsule (15 mg total) by mouth at bedtime as needed for sleep. 30 capsule 0  . Tetrahydrozoline HCl (VISINE OP) Place 1 drop into both eyes daily as needed (for dry eyes).     . fluticasone (FLONASE) 50 MCG/ACT nasal spray PLACE 2 SPRAYS INTO THE NOSE DAILY. (Patient not taking: Reported on 08/09/2017) 16 g 5  . prochlorperazine (COMPAZINE) 10 MG tablet Take 1 tablet (10 mg total) by mouth every 6 (six) hours as needed for nausea or vomiting. (Patient not taking: Reported on 07/26/2017) 30 tablet 0  . traMADol (ULTRAM) 50 MG tablet Take 1 tablet (50 mg total) by mouth every 6 (six) hours as needed for moderate pain. (Patient not taking: Reported on 08/09/2017) 15 tablet 0   No current facility-administered medications for this visit.     SURGICAL HISTORY:  Past Surgical History:  Procedure Laterality Date  . ANKLE FRACTURE SURGERY Left 10 years ago   "hard to wake up from anesthesia" per pt  . [place]  . BREAST BIOPSY Right 1978   BENIGN CYST  . BREAST EXCISIONAL BIOPSY Left 2011   Benign  . BREAST EXCISIONAL BIOPSY  Right 1985   Benign   . CATARACT EXTRACTION    . CATARACT EXTRACTION W/ INTRAOCULAR LENS IMPLANT Bilateral   . COLONOSCOPY  2011  . ELECTROMAGNETIC NAVIGATION BROCHOSCOPY N/A 06/08/2017   Procedure: ELECTROMAGNETIC NAVIGATION BRONCHOSCOPY;  Surgeon: Flora Lipps, MD;  Location: [facility] ORS;  Service: Cardiopulmonary;  Laterality: N/A;  . POLYPECTOMY  2011  . 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 TUBAL LIGATION      REVIEW OF SYSTEMS:   Review of Systems  Constitutional: Negative for appetite change, chills, fatigue, fever and unexpected weight change.  HENT:   Negative for mouth sores, nosebleeds, sore throat and trouble swallowing.   Eyes: Negative for eye problems and icterus.  Respiratory: Negative for cough, hemoptysis, shortness of breath and wheezing.   Cardiovascular: Negative for chest pain and leg swelling.  Gastrointestinal: Negative for abdominal pain, constipation, diarrhea, nausea and vomiting. Positive for mild odynophagia. Genitourinary: Negative for bladder incontinence, difficulty urinating, dysuria, frequency and hematuria.   Musculoskeletal: Negative for back pain, gait problem, neck pain and neck stiffness.  Skin: Negative for and rash. Positive for itching to her right upper back at the site of radiation.  She has a more prominent mole that she has noticed over the past 1 week. Neurological: Negative for dizziness, extremity weakness, gait problem, headaches, light-headedness and seizures.  Hematological: Negative for adenopathy. Does not bruise/bleed easily.  Psychiatric/Behavioral: Negative for confusion, depression and sleep disturbance. The patient is not nervous/anxious.     PHYSICAL EXAMINATION:  Blood pressure 126/65, pulse (!) 110, temperature 98.3 F (36.8 C), temperature source Oral, resp. rate 15, height 4\' 11"  (1.499 m), weight 125 lb 1.6 oz (56.7 kg), SpO2 96 %.  ECOG PERFORMANCE STATUS: 1 - Symptomatic but completely ambulatory  Physical Exam  Constitutional: Oriented to person, place, and time and well-developed, well-nourished, and in no distress. No distress.  HENT:  Head: Normocephalic and atraumatic.  Mouth/Throat: Oropharynx is clear and moist. No oropharyngeal exudate.  Eyes: Conjunctivae are normal. Right eye exhibits no discharge. Left eye exhibits no  discharge. No scleral icterus.  Neck: Normal range of motion. Neck supple.  Cardiovascular: Normal rate, regular rhythm, normal heart sounds and intact distal pulses.   Pulmonary/Chest: Effort normal and breath sounds normal. No respiratory distress. No wheezes. No rales.  Abdominal: Soft. Bowel sounds are normal. Exhibits no distension and no mass. There is no tenderness.  Musculoskeletal: Normal range of motion. Exhibits no edema.  Lymphadenopathy:    No cervical adenopathy.  Neurological: Alert and oriented to person, place, and time. Exhibits normal muscle tone. Gait normal. Coordination normal.  Skin: Skin is warm and dry. No rash noted. Not diaphoretic. No pallor. Erythema noted to her right upper back.  Full as noted in the pictures below. Psychiatric: Mood, memory and judgment normal.  Vitals reviewed.        LABORATORY DATA: Lab Results  Component Value Date   WBC 3.2 (L) 08/09/2017   HGB 12.4 08/09/2017   HCT 35.8 08/09/2017   MCV 94.6 08/09/2017   PLT 199 08/09/2017      Chemistry      Component Value Date/Time   NA 137 08/09/2017 0952   K 4.2 08/09/2017 0952   CL 107 08/09/2017 0952   CO2 22 08/09/2017 0952   BUN 14 08/09/2017 6144  CREATININE 0.79 08/09/2017 0952      Component Value Date/Time   CALCIUM 10.4 08/09/2017 0952   ALKPHOS 85 08/09/2017 0952   AST 19 08/09/2017 0952   ALT 22 08/09/2017 0952   BILITOT 0.4 08/09/2017 0952       RADIOGRAPHIC STUDIES:  No results found.   ASSESSMENT/PLAN:  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 ([place]) This is a very pleasant 73 year old white female with suspicious for stage IV non-small cell lung cancer, adenocarcinoma presented mainly with right lower lobe lung mass in addition to subcarinal lymphadenopathy but there was suspicious metastatic pulmonary nodules. She is currently undergoing a course of concurrent chemoradiation with weekly carboplatin and paclitaxel status post 6 cycles.  She has been tolerating this treatment fairly well with the exception of mild odynophagia. I recommended for her to proceed with cycle #7 today as a scheduled. The patient is scheduled to complete her radiation later this week on 08/11/2017. I have ordered a restaging CT scan of the chest to be performed in approximately 3 to 4 weeks.  She will follow-up 1 to 2 days after the CT scan to discuss the results and further treatment options.  For the mild odynophagia, she will continue Carafate.  For skin changes, I have contacted radiation oncology who advises continuing some Sonafine cream and hydrocortisone to this area twice a day.  She was advised to call immediately if she has any concerning symptoms in the interval. The patient voices understanding of current disease status and treatment options and is in agreement with the current care plan.   Orders Placed This Encounter  Procedures  . CT CHEST W CONTRAST    Standing Status:   Future    Standing Expiration Date:   08/10/2018    Order Specific Question:   If indicated for the ordered procedure, I authorize the administration of contrast media per Radiology protocol    Answer:   Yes    Order Specific Question:   Preferred imaging location?    Answer:   [facility]    Order Specific Question:   Radiology Contrast Protocol - do NOT remove file path    Answer:   [email or web address]    Order Specific Question:   Reason for Exam additional comments    Answer:   lung cancer. Restaging.  Marland Kitchen CBC with Differential (Cancer Center Only)    Standing Status:   Future    Standing Expiration Date:   08/10/2018  . CMP ([facility] only)    Standing Status:   Future    Standing Expiration Date:   08/10/2018   Mikey Bussing, DNP, AGPCNP-BC, AOCNP 08/09/17

## 2017-08-09 NOTE — Assessment & Plan Note (Signed)
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 She is currently undergoing a course of concurrent chemoradiation with weekly carboplatin and paclitaxel status post 6 cycles. She has been tolerating this treatment fairly well with the exception of mild odynophagia. I recommended for her to proceed with cycle #7 today as a scheduled. The patient is scheduled to complete her radiation later this week on 08/11/2017. I have ordered a restaging CT scan of the chest to be performed in approximately 3 to 4 weeks.  She will follow-up 1 to 2 days after the CT scan to discuss the results and further treatment options.  For the mild odynophagia, she will continue Carafate.  For skin changes, I have contacted radiation oncology who advises continuing some Sonafine cream and hydrocortisone to this area twice a day.  She was advised to call immediately if she has any concerning symptoms in the interval. The patient voices understanding of current disease status and treatment options and is in agreement with the current care plan.

## 2017-08-10 ENCOUNTER — Ambulatory Visit
Admission: RE | Admit: 2017-08-10 | Discharge: 2017-08-10 | Disposition: A | Discharge status: Home / Self Care | Payer: PPO | Source: Ambulatory Visit | Attending: Radiation Oncology | Admitting: Radiation Oncology

## 2017-08-10 DIAGNOSIS — C3431 Malignant neoplasm of lower lobe, right bronchus or lung: Secondary | ICD-10-CM | POA: Diagnosis not present

## 2017-08-10 DIAGNOSIS — Z510 Encounter for antineoplastic radiation therapy: Secondary | ICD-10-CM | POA: Diagnosis not present

## 2017-08-11 ENCOUNTER — Encounter: Payer: Self-pay | Admitting: Radiation Oncology

## 2017-08-11 ENCOUNTER — Ambulatory Visit: Payer: PPO

## 2017-08-11 ENCOUNTER — Ambulatory Visit
Admission: RE | Admit: 2017-08-11 | Discharge: 2017-08-11 | Disposition: A | Discharge status: Home / Self Care | Payer: PPO | Source: Ambulatory Visit | Attending: Radiation Oncology | Admitting: Radiation Oncology

## 2017-08-11 DIAGNOSIS — Z510 Encounter for antineoplastic radiation therapy: Secondary | ICD-10-CM | POA: Insufficient documentation

## 2017-08-11 DIAGNOSIS — C3431 Malignant neoplasm of lower lobe, right bronchus or lung: Secondary | ICD-10-CM | POA: Diagnosis not present

## 2017-08-17 ENCOUNTER — Encounter: Payer: Self-pay | Admitting: Family Medicine

## 2017-08-17 ENCOUNTER — Ambulatory Visit (INDEPENDENT_AMBULATORY_CARE_PROVIDER_SITE_OTHER): Payer: PPO | Admitting: Family Medicine

## 2017-08-17 ENCOUNTER — Other Ambulatory Visit: Payer: Self-pay

## 2017-08-17 VITALS — BP 104/68 | HR 105 | Temp 98.5°F | Ht 59.5 in | Wt 123.5 lb

## 2017-08-17 DIAGNOSIS — E119 Type 2 diabetes mellitus without complications: Secondary | ICD-10-CM

## 2017-08-17 DIAGNOSIS — Z0000 Encounter for general adult medical examination without abnormal findings: Secondary | ICD-10-CM | POA: Diagnosis not present

## 2017-08-17 DIAGNOSIS — C3431 Malignant neoplasm of lower lobe, right bronchus or lung: Secondary | ICD-10-CM

## 2017-08-17 DIAGNOSIS — D494 Neoplasm of unspecified behavior of bladder: Secondary | ICD-10-CM

## 2017-08-17 DIAGNOSIS — E7800 Pure hypercholesterolemia, unspecified: Secondary | ICD-10-CM

## 2017-08-17 DIAGNOSIS — G4700 Insomnia, unspecified: Secondary | ICD-10-CM | POA: Insufficient documentation

## 2017-08-17 DIAGNOSIS — G4701 Insomnia due to medical condition: Secondary | ICD-10-CM | POA: Diagnosis not present

## 2017-08-17 DIAGNOSIS — F5104 Psychophysiologic insomnia: Secondary | ICD-10-CM | POA: Insufficient documentation

## 2017-08-17 LAB — POCT GLYCOSYLATED HEMOGLOBIN (HGB A1C): Hemoglobin A1C: 5.8

## 2017-08-17 MED ORDER — TEMAZEPAM 15 MG PO CAPS: 15.0000 mg | ORAL_CAPSULE | Freq: Every evening | ORAL | 0 refills | Status: DC | PRN

## 2017-08-17 NOTE — Assessment & Plan Note (Signed)
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  She is status post TURBT on 05/04/2017.  She received intravesical gemcitabine post resection.    Followed by [facility].

## 2017-08-17 NOTE — Progress Notes (Signed)
Subjective:    Patient ID: Katelyn Kennedy, female    DOB: Dec 16, 1945, 72 y.o.   MRN: [id number]  HPI   72 year old female presents for medicare wellness.  I have personally reviewed the Medicare Annual Wellness questionnaire and have noted 1. The patient's medical and social history 2. Their use of alcohol, tobacco or illicit drugs 3. Their current medications and supplements 4. The patient's functional ability including ADL's, fall risks, home safety risks and hearing or visual             impairment. 5. Diet and physical activities 6. Evidence for depression or mood disorders 7.         Updated provider list Cognitive evaluation was performed and recorded on pt medicare questionnaire form. The patients weight, height, BMI and visual acuity have been recorded in the chart  I have made referrals, counseling and provided education to the patient based review of the above and I have provided the pt with a written personalized care plan for preventive services.   Documentation of this information was scanned into the electronic record under the media tab.  She is current in the midst of lung cancer treatment. Questionablestage IV (T2a,N2, M1a) non-small cell lung cancer,adenocarcinomapresenting with right lower lobe lung mass in addition to subcarinal lymphadenopathy and suspicious metastatic pulmonary nodules.  She has had 33 treatments of chemo as well as radition.Marland Kitchen Has CT lung to see how treatment has responded.   onc Dr. Ivin Poot.  In last week has been feeling more tired.  Carafate helps with throat irritation. Using antiemetic for nausea.  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  She is status post TURBT on 05/04/2017.  She received intravesical gemcitabine post resection.   Follow uro Dr. Bernardo Heater .. ahs upcoming follow up   Former smoker.Marland Kitchen QUIT 04/2017.  Wt Readings from Last 3 Encounters:  08/17/17 123 lb 8 oz (56 kg)  08/09/17 125 lb 1.6 oz  (56.7 kg)  08/02/17 125 lb 8 oz (56.9 kg)     Diabetes:   Lab Results  Component Value Date   HGBA1C 5.8 08/17/2017  Using medications without difficulties: Hypoglycemic episodes: Hyperglycemic episodes: Feet problems: Blood Sugars averaging: eye exam within last year:  Will hold off on cholesterol testing for now. CBC and CMET followed at Oncology.  Fall Risk  08/17/2017 06/17/2017 06/17/2017 08/07/2016 07/31/2014  Falls in the past year? No No No No No    Advance directives and end of life planning reviewed in detail with patient and documented in EMR. Patient given handout on advance care directives if needed. HCPOA and living will updated if needed.  Depression screen [facility] 2/9 08/17/2017 06/17/2017 08/07/2016  Decreased Interest 0 0 0  Down, Depressed, Hopeless 0 0 0  PHQ - 2 Score 0 0 0    Hearing Screening   Method: Audiometry   125Hz  250Hz  500Hz  1000Hz  2000Hz  3000Hz  4000Hz  6000Hz  8000Hz   Right ear:   40 40 20  20    Left ear:   40 25 20  20       Visual Acuity Screening   Right eye Left eye Both eyes  Without correction:     With correction: 20/20 20/20 20/20      Social History /Family History/Past Medical History reviewed in detail and updated in EMR if needed. Blood pressure 104/68, pulse (!) 105, temperature 98.5 F (36.9 C), temperature source Oral, height 4' 11.5" (1.511 m), weight 123 lb 8 oz (  56 kg).    Review of Systems  Constitutional: Positive for fatigue. Negative for fever.  HENT: Negative for congestion.   Eyes: Negative for pain.  Respiratory: Negative for cough and shortness of breath.   Cardiovascular: Negative for chest pain, palpitations and leg swelling.  Gastrointestinal: Positive for nausea. Negative for abdominal pain.  Genitourinary: Negative for dysuria and vaginal bleeding.  Musculoskeletal: Negative for back pain.  Skin: Positive for rash.  Neurological: Negative for syncope, light-headedness and headaches.  Psychiatric/Behavioral: Negative  for dysphoric mood.       Objective:   Physical Exam  Constitutional: Vital signs are normal. She appears well-developed and well-nourished. She is cooperative.  Non-toxic appearance. She does not appear ill. No distress.  HENT:  Head: Normocephalic.  Right Ear: Hearing, tympanic membrane, external ear and ear canal normal.  Left Ear: Hearing, tympanic membrane, external ear and ear canal normal.  Nose: Nose normal.  Eyes: Pupils are equal, round, and reactive to light. Conjunctivae, EOM and lids are normal. Lids are everted and swept, no foreign bodies found.  Neck: Trachea normal and normal range of motion. Neck supple. Carotid bruit is not present. No thyroid mass and no thyromegaly present.  Cardiovascular: Normal rate, regular rhythm, S1 normal, S2 normal, normal heart sounds and intact distal pulses. Exam reveals no gallop.  No murmur heard. Pulmonary/Chest: Effort normal and breath sounds normal. No respiratory distress. She has no wheezes. She has no rhonchi. She has no rales.  Abdominal: Soft. Normal appearance and bowel sounds are normal. She exhibits no distension, no fluid wave, no abdominal bruit and no mass. There is no hepatosplenomegaly. There is no tenderness. There is no rebound, no guarding and no CVA tenderness. No hernia.  Lymphadenopathy:    She has no cervical adenopathy.    She has no axillary adenopathy.  Neurological: She is alert. She has normal strength. No cranial nerve deficit or sensory deficit.  Skin: Skin is warm, dry and intact. No rash noted.   " radiation burn and irriattion of mlke" on right mid back.Marland Kitchen Healing.  Psychiatric: Her speech is normal and behavior is normal. Judgment normal. Her mood appears not anxious. Cognition and memory are normal. She does not exhibit a depressed mood.     Diabetic foot exam: Normal inspection No skin breakdown No calluses  Normal DP pulses Normal sensation to light touch and monofilament Nails normal        Assessment & Plan:  The patient's preventative maintenance and recommended screening tests for an annual wellness exam were reviewed in full today. Brought up to date unless services declined.  Counselled on the importance of diet, exercise, and its role in overall health and mortality. The patient's FH and SH was reviewed, including their home life, tobacco status, and drug and alcohol status.

## 2017-08-17 NOTE — Assessment & Plan Note (Signed)
Well controlled  With diet.

## 2017-08-17 NOTE — Assessment & Plan Note (Signed)
Will hold on eval for now.

## 2017-08-17 NOTE — Assessment & Plan Note (Signed)
S/P chemo and radiation.  followed By Dr. Ivin Poot.

## 2017-08-17 NOTE — Assessment & Plan Note (Signed)
Refill temezepam. Using at bedtime appropriately.

## 2017-08-18 ENCOUNTER — Encounter: Payer: Self-pay | Admitting: Urology

## 2017-08-18 ENCOUNTER — Ambulatory Visit: Payer: PPO | Admitting: Urology

## 2017-08-18 VITALS — BP 120/69 | HR 106 | Ht 59.0 in | Wt 124.6 lb

## 2017-08-18 DIAGNOSIS — R310 Gross hematuria: Secondary | ICD-10-CM | POA: Diagnosis not present

## 2017-08-18 DIAGNOSIS — C679 Malignant neoplasm of bladder, unspecified: Secondary | ICD-10-CM

## 2017-08-18 LAB — MICROSCOPIC EXAMINATION: WBC, UA: NONE SEEN /hpf (ref 0–5)

## 2017-08-18 LAB — URINALYSIS, COMPLETE
Bilirubin, UA: NEGATIVE
Glucose, UA: NEGATIVE
Nitrite, UA: NEGATIVE
Protein, UA: NEGATIVE
RBC, UA: NEGATIVE
Specific Gravity, UA: 1.02 (ref 1.005–1.030)
Urobilinogen, Ur: 1 mg/dL (ref 0.2–1.0)
pH, UA: 5.5 (ref 5.0–7.5)

## 2017-08-18 MED ORDER — CIPROFLOXACIN HCL 500 MG PO TABS
500.0000 mg | ORAL_TABLET | Freq: Once | ORAL | Status: AC
  Administered 2017-08-18: 500 mg via ORAL

## 2017-08-18 NOTE — Progress Notes (Signed)
   08/18/17  CC:  Chief Complaint  Patient presents with  . Cysto    HPI: Ta low risk urothelial carcinoma status post TURBT January 2019.  Post resection intravesical gemcitabine was administered.  Presents for first surveillance cystoscopy.  Blood pressure 120/69, pulse (!) 106, height 4\' 11"  (1.499 m), weight 124 lb 9.6 oz (56.5 kg). NED. A&Ox3.   No respiratory distress   Abd soft, NT, ND Normal external genitalia with patent urethral meatus  Cystoscopy Procedure Note  Patient identification was confirmed, informed consent was obtained, and patient was prepped using Betadine solution.  Lidocaine jelly was administered per urethral meatus.    Preoperative abx where received prior to procedure.    Procedure: - Flexible cystoscope introduced, without any difficulty.   - Thorough search of the bladder revealed:    normal urethral meatus    normal urothelium    no stones    no ulcers     no tumors    no urethral polyps    no trabeculation  - Ureteral orifices were normal in position and appearance.  Post-Procedure: - Patient tolerated the procedure well  Assessment/ Plan: No evidence of recurrent tumor.  Follow-up surveillance cystoscopy in 9 months.   Abbie Sons, MD

## 2017-08-25 NOTE — Progress Notes (Signed)
  Radiation Oncology         [phone]) [phone] ________________________________  Name: Katelyn Kennedy MRN: [id number]  Date: 08/11/2017  DOB: 1946/02/15  End of Treatment Note  Diagnosis:  72 y.o. female with Stage III versus Stage IV NSCLC, adenocarcinoma of the RLL    Indication for treatment::  curative       Radiation treatment dates:   06/28/2017 - 08/11/2017  Site/dose:   The patient was treated to the disease within the right lung initially to a dose of 60 Gy using a 4 field, 3-D conformal technique. The patient then received a cone down boost treatment for an additional 6 Gy. This yielded a final total dose of 66 Gy.   Narrative: The patient tolerated radiation treatment relatively well.  The patient did not experience esophagitis during the course of treatment which required management. She did have a productive cough and took carafate. She endorsed some increased fatigue but otherwise feels well overall.  Plan: The patient has completed radiation treatment. The patient will return to radiation oncology clinic for routine followup in one month. I advised the patient to call or return sooner if they have any questions or concerns related to their recovery or treatment. ________________________________  Jodelle Gross, MD, PhD  This document serves as a record of services personally performed by Kyung Rudd, MD. It was created on his behalf by Rae Lips, a trained medical scribe. The creation of this record is based on the scribe's personal observations and the provider's statements to them. This document has been checked and approved by the attending provider.

## 2017-09-01 ENCOUNTER — Encounter (HOSPITAL_COMMUNITY): Payer: Self-pay

## 2017-09-01 ENCOUNTER — Inpatient Hospital Stay: Payer: PPO | Attending: Internal Medicine

## 2017-09-01 ENCOUNTER — Ambulatory Visit (HOSPITAL_COMMUNITY)
Admission: RE | Admit: 2017-09-01 | Discharge: 2017-09-01 | Disposition: A | Discharge status: Home / Self Care | Payer: PPO | Source: Ambulatory Visit | Attending: Oncology | Admitting: Oncology

## 2017-09-01 DIAGNOSIS — C3431 Malignant neoplasm of lower lobe, right bronchus or lung: Secondary | ICD-10-CM

## 2017-09-01 DIAGNOSIS — I700 Atherosclerosis of aorta: Secondary | ICD-10-CM | POA: Insufficient documentation

## 2017-09-01 DIAGNOSIS — J439 Emphysema, unspecified: Secondary | ICD-10-CM | POA: Diagnosis not present

## 2017-09-01 DIAGNOSIS — J984 Other disorders of lung: Secondary | ICD-10-CM | POA: Diagnosis not present

## 2017-09-01 DIAGNOSIS — C3490 Malignant neoplasm of unspecified part of unspecified bronchus or lung: Secondary | ICD-10-CM | POA: Diagnosis not present

## 2017-09-01 LAB — CBC WITH DIFFERENTIAL (CANCER CENTER ONLY)
Basophils Absolute: 0 10*3/uL (ref 0.0–0.1)
Basophils Relative: 1 %
Eosinophils Absolute: 0 10*3/uL (ref 0.0–0.5)
Eosinophils Relative: 1 %
HCT: 36 % (ref 34.8–46.6)
Hemoglobin: 12.5 g/dL (ref 11.6–15.9)
Lymphocytes Relative: 21 %
Lymphs Abs: 1.1 10*3/uL (ref 0.9–3.3)
MCH: 33.6 pg (ref 25.1–34.0)
MCHC: 34.6 g/dL (ref 31.5–36.0)
MCV: 97 fL (ref 79.5–101.0)
Monocytes Absolute: 0.8 10*3/uL (ref 0.1–0.9)
Monocytes Relative: 15 %
Neutro Abs: 3.2 10*3/uL (ref 1.5–6.5)
Neutrophils Relative %: 62 %
Platelet Count: 232 10*3/uL (ref 145–400)
RBC: 3.72 MIL/uL (ref 3.70–5.45)
RDW: 16.6 % — ABNORMAL HIGH (ref 11.2–14.5)
WBC Count: 5.1 10*3/uL (ref 3.9–10.3)

## 2017-09-01 LAB — CMP (CANCER CENTER ONLY)
ALT: 31 U/L (ref 0–55)
AST: 28 U/L (ref 5–34)
Albumin: 4 g/dL (ref 3.5–5.0)
Alkaline Phosphatase: 104 U/L (ref 40–150)
Anion gap: 8 (ref 3–11)
BUN: 13 mg/dL (ref 7–26)
CO2: 24 mmol/L (ref 22–29)
Calcium: 10.1 mg/dL (ref 8.4–10.4)
Chloride: 107 mmol/L (ref 98–109)
Creatinine: 0.83 mg/dL (ref 0.60–1.10)
GFR, Est AFR Am: >60 mL/min (ref >60)
GFR, Estimated: >60 mL/min (ref >60)
Glucose, Bld: 101 mg/dL (ref 70–140)
Potassium: 4.2 mmol/L (ref 3.5–5.1)
Sodium: 139 mmol/L (ref 136–145)
Total Bilirubin: 0.5 mg/dL (ref 0.2–1.2)
Total Protein: 7.2 g/dL (ref 6.4–8.3)

## 2017-09-01 MED ORDER — IOHEXOL 300 MG/ML  SOLN
75.0000 mL | Freq: Once | INTRAMUSCULAR | Status: AC | PRN
  Administered 2017-09-01: 75 mL via INTRAVENOUS

## 2017-09-13 ENCOUNTER — Inpatient Hospital Stay: Payer: PPO | Attending: Internal Medicine | Admitting: Internal Medicine

## 2017-09-13 ENCOUNTER — Telehealth: Payer: Self-pay | Admitting: Internal Medicine

## 2017-09-13 ENCOUNTER — Encounter: Payer: Self-pay | Admitting: Internal Medicine

## 2017-09-13 VITALS — BP 120/67 | HR 102 | Temp 98.7°F | Resp 18 | Ht 59.0 in | Wt 124.7 lb

## 2017-09-13 DIAGNOSIS — F17210 Nicotine dependence, cigarettes, uncomplicated: Secondary | ICD-10-CM

## 2017-09-13 DIAGNOSIS — Z5112 Encounter for antineoplastic immunotherapy: Secondary | ICD-10-CM | POA: Diagnosis not present

## 2017-09-13 DIAGNOSIS — M129 Arthropathy, unspecified: Secondary | ICD-10-CM | POA: Diagnosis not present

## 2017-09-13 DIAGNOSIS — R918 Other nonspecific abnormal finding of lung field: Secondary | ICD-10-CM

## 2017-09-13 DIAGNOSIS — I700 Atherosclerosis of aorta: Secondary | ICD-10-CM | POA: Diagnosis not present

## 2017-09-13 DIAGNOSIS — Z79899 Other long term (current) drug therapy: Secondary | ICD-10-CM | POA: Diagnosis not present

## 2017-09-13 DIAGNOSIS — K229 Disease of esophagus, unspecified: Secondary | ICD-10-CM | POA: Diagnosis not present

## 2017-09-13 DIAGNOSIS — E119 Type 2 diabetes mellitus without complications: Secondary | ICD-10-CM

## 2017-09-13 DIAGNOSIS — J439 Emphysema, unspecified: Secondary | ICD-10-CM

## 2017-09-13 DIAGNOSIS — Z7189 Other specified counseling: Secondary | ICD-10-CM

## 2017-09-13 DIAGNOSIS — C3431 Malignant neoplasm of lower lobe, right bronchus or lung: Secondary | ICD-10-CM | POA: Diagnosis not present

## 2017-09-13 NOTE — Telephone Encounter (Signed)
Appointments scheduled AVS/Calendar printed per 6/3 los

## 2017-09-13 NOTE — Progress Notes (Signed)
DISCONTINUE ON PATHWAY REGIMEN - Non-Small Cell Lung     Administer weekly:     Paclitaxel      Carboplatin   **Always confirm dose/schedule in your pharmacy ordering system**  REASON: Continuation Of Treatment PRIOR TREATMENT: [id number]: Carboplatin AUC=2 + Paclitaxel 45 mg/m2 Weekly During Radiation TREATMENT RESPONSE: Partial Response (PR)  START ON PATHWAY REGIMEN - Non-Small Cell Lung     A cycle is every 14 days:     Durvalumab   **Always confirm dose/schedule in your pharmacy ordering system**  Patient Characteristics: Stage III - Unresectable, PS = 0, 1 AJCC T Category: T2a Current Disease Status: No Distant Mets or Local Recurrence AJCC N Category: N2 AJCC M Category: M0 AJCC 8 Stage Grouping: IIIA Performance Status: PS = 0, 1 Intent of Therapy: Curative Intent, Discussed with Patient

## 2017-09-13 NOTE — Progress Notes (Signed)
[place] [phone]   [phone]  OFFICE PROGRESS NOTE  [name], MD [place] [address]  DIAGNOSIS: Stage IV (T2a,N2, M0) non-small cell lung cancer,adenocarcinomapresenting with right lower lobe lung mass in addition to subcarinal lymphadenopathy and suspicious metastatic pulmonary nodules diagnosed in February 2019.  PRIOR THERAPY: Concurrent chemoradiation with carboplatin for an AUC of 2 with paclitaxel 45 mg/m weekly. First dose given on 06/28/2017.  Status post 7 cycles.  Last dose was giving August 09, 2017 with partial response.  CURRENT THERAPY: Consolidation immunotherapy was Imfinzi (Durvalumab) 10 mg/KG every 2 weeks, first dose 09/23/2017.  INTERVAL HISTORY: Katelyn Kennedy 72 y.o. female follow-up visit accompanied by her daughter Katelyn Kennedy.  The patient is feeling fine today with no specific complaints.  She tolerated the previous course of concurrent chemoradiation with weekly carboplatin and paclitaxel fairly well.  She denied having any chest pain, shortness of breath but continues to have cough productive of yellowish sputum.  She denied having any hemoptysis.  The patient has no fever or chills.  She has no weight loss or night sweats.  She denied having any nausea, vomiting, diarrhea or constipation.  She had repeat CT scan of the chest performed recently and she is here for evaluation and discussion of her scan results and treatment options.  MEDICAL HISTORY: Past Medical History:  Diagnosis Date  . Allergic rhinitis   . Arthritis   . Diabetes mellitus without complication (HCC)    diet control/no meds per pt  . Dyspnea   . History of chicken pox   . Smoker     ALLERGIES:  is allergic to codeine.  MEDICATIONS:  Current Outpatient Medications  Medication Sig Dispense Refill  . acetaminophen (TYLENOL) 500 MG tablet Take 500 mg by mouth daily as needed for moderate pain or headache.    Marland Kitchen  atorvastatin (LIPITOR) 20 MG tablet Take 20 mg by mouth daily.    . Calcium Carbonate-Vitamin D (CALCIUM + D) 600-200 MG-UNIT per tablet Take 1 tablet by mouth daily.     . fluticasone (FLONASE) 50 MCG/ACT nasal spray PLACE 2 SPRAYS INTO THE NOSE DAILY. 16 g 5  . lisinopril (PRINIVIL,ZESTRIL) 5 MG tablet Take 0.5 tablets (2.5 mg total) by mouth daily. 45 tablet 3  . Multiple Vitamin (MULTIVITAMIN) capsule Take 1 capsule by mouth daily.      . prochlorperazine (COMPAZINE) 10 MG tablet Take 1 tablet (10 mg total) by mouth every 6 (six) hours as needed for nausea or vomiting. 30 tablet 0  . sucralfate (CARAFATE) 1 g tablet Take 1 tablet (1 g total) by mouth 4 (four) times daily. 120 tablet 2  . temazepam (RESTORIL) 15 MG capsule Take 1 capsule (15 mg total) by mouth at bedtime as needed for sleep. 30 capsule 0  . Tetrahydrozoline HCl (VISINE OP) Place 1 drop into both eyes daily as needed (for dry eyes).     . traMADol (ULTRAM) 50 MG tablet Take 1 tablet (50 mg total) by mouth every 6 (six) hours as needed for moderate pain. 15 tablet 0   No current facility-administered medications for this visit.     SURGICAL HISTORY:  Past Surgical History:  Procedure Laterality Date  . ANKLE FRACTURE SURGERY Left 10 years ago   "hard to wake up from anesthesia" per pt  . [place]  . BREAST BIOPSY Right 1978   BENIGN CYST  . BREAST  EXCISIONAL BIOPSY Left 2011   Benign  . BREAST EXCISIONAL BIOPSY Right 1985   Benign   . CATARACT EXTRACTION    . CATARACT EXTRACTION W/ INTRAOCULAR LENS IMPLANT Bilateral   . COLONOSCOPY  2011  . ELECTROMAGNETIC NAVIGATION BROCHOSCOPY N/A 06/08/2017   Procedure: ELECTROMAGNETIC NAVIGATION BRONCHOSCOPY;  Surgeon: Flora Lipps, MD;  Location: [facility] ORS;  Service: Cardiopulmonary;  Laterality: N/A;  . POLYPECTOMY  2011  . TRANSURETHRAL RESECTION OF BLADDER TUMOR WITH MITOMYCIN-C N/A 05/04/2017   Procedure: TRANSURETHRAL RESECTION OF BLADDER TUMOR WITH  gemcitabine;  Surgeon: Abbie Sons, MD;  Location: [facility] ORS;  Service: Urology;  Laterality: N/A;  . TUBAL LIGATION      REVIEW OF SYSTEMS:  Constitutional: negative Eyes: negative Ears, nose, mouth, throat, and face: negative Respiratory: positive for cough Cardiovascular: negative Gastrointestinal: negative Genitourinary:negative Integument/breast: negative Hematologic/lymphatic: negative Musculoskeletal:negative Neurological: negative Behavioral/Psych: negative Endocrine: negative Allergic/Immunologic: negative   PHYSICAL EXAMINATION: General appearance: alert, cooperative and no distress Head: Normocephalic, without obvious abnormality, atraumatic Neck: no adenopathy, no JVD, supple, symmetrical, trachea midline and thyroid not enlarged, symmetric, no tenderness/mass/nodules Lymph nodes: Cervical, supraclavicular, and axillary nodes normal. Resp: clear to auscultation bilaterally Back: symmetric, no curvature. ROM normal. No CVA tenderness. Cardio: regular rate and rhythm, S1, S2 normal, no murmur, click, rub or gallop GI: soft, non-tender; bowel sounds normal; no masses,  no organomegaly Extremities: extremities normal, atraumatic, no cyanosis or edema Neurologic: Alert and oriented X 3, normal strength and tone. Normal symmetric reflexes. Normal coordination and gait  ECOG PERFORMANCE STATUS: 1 - Symptomatic but completely ambulatory  Blood pressure 120/67, pulse (!) 102, temperature 98.7 F (37.1 C), temperature source Oral, resp. rate 18, height 4\' 11"  (1.499 m), weight 124 lb 11.2 oz (56.6 kg), SpO2 100 %.  LABORATORY DATA: Lab Results  Component Value Date   WBC 5.1 09/01/2017   HGB 12.5 09/01/2017   HCT 36.0 09/01/2017   MCV 97.0 09/01/2017   PLT 232 09/01/2017      Chemistry      Component Value Date/Time   NA 139 09/01/2017 0934   K 4.2 09/01/2017 0934   CL 107 09/01/2017 0934   CO2 24 09/01/2017 0934   BUN 13 09/01/2017 0934   CREATININE 0.83  09/01/2017 0934      Component Value Date/Time   CALCIUM 10.1 09/01/2017 0934   ALKPHOS 104 09/01/2017 0934   AST 28 09/01/2017 0934   ALT 31 09/01/2017 0934   BILITOT 0.5 09/01/2017 0934       RADIOGRAPHIC STUDIES: Ct Chest W Contrast  Result Date: 09/01/2017 CLINICAL DATA:  Right-sided lung cancer. EXAM: CT CHEST WITH CONTRAST TECHNIQUE: Multidetector CT imaging of the chest was performed during intravenous contrast administration. CONTRAST:  31mL OMNIPAQUE IOHEXOL 300 MG/ML  SOLN COMPARISON:  06/08/2017 FINDINGS: Cardiovascular: The heart size is normal. No pericardial effusion. Coronary artery calcification is evident. Atherosclerotic calcification is noted in the wall of the thoracic aorta. Mediastinum/Nodes: No mediastinal lymphadenopathy. 10 mm short axis subcarinal lymph node identified on the previous study is now 5 mm short axis. There is no hilar lymphadenopathy. Mild circumferential wall thickening is noted in the mid esophagus (image 73/series 2). There is no axillary lymphadenopathy. Small bilateral thyroid nodules are evident, not seen on the previous noncontrast study. Lungs/Pleura: The central tracheobronchial airways are patent. Centrilobular emphysema noted. Right lower lobe nodule measured previously at 3.4 x 1.9 cm now measures 1.2 x 0.7 cm. 7 mm posterior right lower lobe nodule is stable at  7 mm (image 117/7). New small ground-glass nodules are identified in the central right upper lobe (52/7), anterior right lower lobe (62/7) and central right lower lobe (77/7). Upper Abdomen: 9 mm low-density lesion in the lateral segment left liver on the prior study is 11 mm today. Another tiny hypoattenuating lesion in the posterior right liver (128/2) is unchanged. Musculoskeletal: Bone windows reveal no worrisome lytic or sclerotic osseous lesions. IMPRESSION: 1. Interval decrease in the dominant right lower lobe pulmonary lesion. 2. Stable 7 mm right lower lobe nodule with new scattered  small ground-glass nodules in the right lung. The ground-glass opacities may reflect infectious/inflammatory alveolitis or post radiation change. Attention on follow-up recommended. 3. Mild circumferential wall thickening in the mid esophagus suggests esophagitis. 4.  Aortic Atherosclerois (ICD10-170.0) 5.  Emphysema. (ICD10-J43.9). Electronically Signed   By: Misty Stanley M.D.   On: 09/01/2017 14:03    ASSESSMENT AND PLAN: This is a very pleasant 72 years old white female with suspicious for stage IIIA non-small cell lung cancer, adenocarcinoma presented mainly with right lower lobe lung mass in addition to subcarinal lymphadenopathy but there was suspicious metastatic pulmonary nodules. The patient completed a course of concurrent chemoradiation with weekly carboplatin and paclitaxel.  She tolerated her treatment well with no concerning complaints. She had repeat CT scan of the chest performed recently.  I personally and independently reviewed the scan results and showed the images to the patient and her daughter.  Her scan showed significant improvement of her disease. I discussed with the patient her treatment options including continuous observation and monitoring versus consolidation immunotherapy was Imfinzi (Durvalumab) 10 mg/KG every 2 weeks for a total of 1 year if she has no evidence for disease progression or intolerable toxicity.  The patient is interested in proceeding with immunotherapy.  I discussed with her the adverse effect of this treatment including but not limited to immunotherapy mediated skin rash, diarrhea, inflammation of the lung, kidney, liver, thyroid or other endocrine dysfunction. She is expected to start the first dose of this treatment next week. I will see her back for follow-up visit in 3 weeks for evaluation with the start of cycle #2. The patient was advised to call immediately if she has any concerning symptoms in the interval. All questions were answered. The patient  knows to call the clinic with any problems, questions or concerns. We can certainly see the patient much sooner if necessary.   Disclaimer: This note was dictated with voice recognition software. Similar sounding words can inadvertently be transcribed and may not be corrected upon review.

## 2017-09-19 ENCOUNTER — Other Ambulatory Visit: Payer: Self-pay | Admitting: Family Medicine

## 2017-09-20 ENCOUNTER — Telehealth: Payer: Self-pay | Admitting: Internal Medicine

## 2017-09-20 ENCOUNTER — Telehealth: Payer: Self-pay | Admitting: *Deleted

## 2017-09-20 ENCOUNTER — Telehealth: Payer: Self-pay | Admitting: Medical Oncology

## 2017-09-20 NOTE — Telephone Encounter (Signed)
Returned the patient call.  Left her a voicemail asking for a return call so that I could address her initial voicemail via the triage line.  Left a call back number.  Will continue to follow as necessary.  Cori Razor, RN

## 2017-09-20 NOTE — Telephone Encounter (Signed)
Relative has shingles and pt asking if she can visit. Relative is on antiviral. I told pt not to visit until shingles dried up and antiviral therapy completed.

## 2017-09-20 NOTE — Telephone Encounter (Signed)
Patient called to reschedule appt for 6/13 to 1/29 due to conflict with her schedule.

## 2017-09-21 ENCOUNTER — Ambulatory Visit
Admission: RE | Admit: 2017-09-21 | Discharge: 2017-09-21 | Disposition: A | Discharge status: Home / Self Care | Payer: PPO | Source: Ambulatory Visit | Attending: Radiation Oncology | Admitting: Radiation Oncology

## 2017-09-21 ENCOUNTER — Other Ambulatory Visit: Payer: Self-pay

## 2017-09-21 ENCOUNTER — Encounter: Payer: Self-pay | Admitting: Radiation Oncology

## 2017-09-21 ENCOUNTER — Inpatient Hospital Stay: Payer: PPO

## 2017-09-21 VITALS — BP 132/64 | HR 97 | Temp 98.8°F | Resp 16

## 2017-09-21 VITALS — BP 101/71 | HR 102 | Temp 99.7°F | Resp 20 | Ht 59.0 in | Wt 124.0 lb

## 2017-09-21 DIAGNOSIS — C3431 Malignant neoplasm of lower lobe, right bronchus or lung: Secondary | ICD-10-CM

## 2017-09-21 DIAGNOSIS — J439 Emphysema, unspecified: Secondary | ICD-10-CM | POA: Diagnosis not present

## 2017-09-21 DIAGNOSIS — Z923 Personal history of irradiation: Secondary | ICD-10-CM | POA: Insufficient documentation

## 2017-09-21 DIAGNOSIS — Z5112 Encounter for antineoplastic immunotherapy: Secondary | ICD-10-CM | POA: Diagnosis not present

## 2017-09-21 LAB — CMP (CANCER CENTER ONLY)
ALT: 10 U/L (ref 0–55)
AST: 18 U/L (ref 5–34)
Albumin: 3.8 g/dL (ref 3.5–5.0)
Alkaline Phosphatase: 117 U/L (ref 40–150)
Anion gap: 10 (ref 3–11)
BUN: 11 mg/dL (ref 7–26)
CO2: 26 mmol/L (ref 22–29)
Calcium: 10.6 mg/dL — ABNORMAL HIGH (ref 8.4–10.4)
Chloride: 104 mmol/L (ref 98–109)
Creatinine: 0.84 mg/dL (ref 0.60–1.10)
GFR, Est AFR Am: >60 mL/min (ref >60)
GFR, Estimated: >60 mL/min (ref >60)
Glucose, Bld: 103 mg/dL (ref 70–140)
Potassium: 4.4 mmol/L (ref 3.5–5.1)
Sodium: 140 mmol/L (ref 136–145)
Total Bilirubin: 0.5 mg/dL (ref 0.2–1.2)
Total Protein: 7.4 g/dL (ref 6.4–8.3)

## 2017-09-21 LAB — CBC WITH DIFFERENTIAL (CANCER CENTER ONLY)
Basophils Absolute: 0 10*3/uL (ref 0.0–0.1)
Basophils Relative: 0 %
Eosinophils Absolute: 0.1 10*3/uL (ref 0.0–0.5)
Eosinophils Relative: 2 %
HCT: 37.3 % (ref 34.8–46.6)
Hemoglobin: 12.4 g/dL (ref 11.6–15.9)
Lymphocytes Relative: 13 %
Lymphs Abs: 0.8 10*3/uL — ABNORMAL LOW (ref 0.9–3.3)
MCH: 33.3 pg (ref 25.1–34.0)
MCHC: 33.2 g/dL (ref 31.5–36.0)
MCV: 100.3 fL (ref 79.5–101.0)
Monocytes Absolute: 0.7 10*3/uL (ref 0.1–0.9)
Monocytes Relative: 11 %
Neutro Abs: 4.8 10*3/uL (ref 1.5–6.5)
Neutrophils Relative %: 74 %
Platelet Count: 226 10*3/uL (ref 145–400)
RBC: 3.72 MIL/uL (ref 3.70–5.45)
RDW: 14.4 % (ref 11.2–14.5)
WBC Count: 6.5 10*3/uL (ref 3.9–10.3)

## 2017-09-21 LAB — TSH: TSH: 0.63 u[IU]/mL (ref 0.308–3.960)

## 2017-09-21 MED ORDER — SODIUM CHLORIDE 0.9 % IV SOLN
Freq: Once | INTRAVENOUS | Status: AC
  Administered 2017-09-21: 14:00:00 via INTRAVENOUS

## 2017-09-21 MED ORDER — SODIUM CHLORIDE 0.9 % IV SOLN
10.5000 mg/kg | Freq: Once | INTRAVENOUS | Status: AC
  Administered 2017-09-21: 600 mg via INTRAVENOUS
  Filled 2017-09-21: qty 2

## 2017-09-21 NOTE — Progress Notes (Signed)
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 The patient then received a cone down boost treatment for an additional 6 Gy. This yielded a final total dose of 66 Gy.    Narrative:  The patient returns today for routine follow-up.  She tolerated radiotherapy but did have some esophagitis that has since resolved. She did have repeat imaging on  09/01/17 that revealed improvement in the  RLL lesion from 3.4 x 1.9 cm to now 1.2 x .7 cm. She continues on systemic immunotherapy.                        On review of systems, the patient states she is doing well. She denies any new symptoms of shortness of breath, fevers, or chills. She does have mild productive mucous with coughing but feels quite well. No other complaints are noted.  ALLERGIES:  is allergic to codeine.  Meds: Current Outpatient Medications  Medication Sig Dispense Refill  . acetaminophen (TYLENOL) 500 MG tablet Take 500 mg by mouth daily as needed for moderate pain or headache.    Marland Kitchen atorvastatin (LIPITOR) 20 MG tablet TAKE 1 TABLET BY MOUTH EVERY DAY 90 tablet 0  . Calcium Carbonate-Vitamin D (CALCIUM + D) 600-200 MG-UNIT per tablet Take 1 tablet by mouth daily.     Marland Kitchen lisinopril (PRINIVIL,ZESTRIL) 5 MG tablet Take 0.5 tablets (2.5 mg total) by mouth daily. 45 tablet 3  . Multiple Vitamin (MULTIVITAMIN) capsule Take 1 capsule by mouth daily.      . temazepam (RESTORIL) 15 MG capsule Take 1 capsule (15 mg total) by mouth at bedtime as needed for sleep. 30 capsule 0  .  Tetrahydrozoline HCl (VISINE OP) Place 1 drop into both eyes daily as needed (for dry eyes).     . fluticasone (FLONASE) 50 MCG/ACT nasal spray PLACE 2 SPRAYS INTO THE NOSE DAILY. (Patient not taking: Reported on 09/21/2017) 16 g 5  . prochlorperazine (COMPAZINE) 10 MG tablet Take 1 tablet (10 mg total) by mouth every 6 (six) hours as needed for nausea or vomiting. (Patient not taking: Reported on 09/21/2017) 30 tablet 0  . sucralfate (CARAFATE) 1 g tablet Take 1 tablet (1 g total) by mouth 4 (four) times daily. (Patient not taking: Reported on 09/21/2017) 120 tablet 2  . traMADol (ULTRAM) 50 MG tablet Take 1 tablet (50 mg total) by mouth every 6 (six) hours as needed for moderate pain. (Patient not taking: Reported on 09/21/2017) 15 tablet 0   No current facility-administered medications for this encounter.     Physical Findings:  height is 4\' 11"  (1.499 m) and weight is 124 lb (56.2 kg). Her oral temperature is 99.7 F (37.6 C). Her blood pressure is 101/71 and her pulse is 102 (abnormal). Her respiration is 20 and oxygen saturation is 100%.  Pain Assessment Pain Score: 0-No pain/10 In general this is a well appearing caucasian female in no acute distress. She's alert and oriented x4 and appropriate  throughout the examination. Cardiopulmonary assessment is negative for acute distress and she exhibits normal effort.   Lab Findings: Lab Results  Component Value Date   WBC 5.1 09/01/2017   HGB 12.5 09/01/2017   HCT 36.0 09/01/2017   MCV 97.0 09/01/2017   PLT 232 09/01/2017     Radiographic Findings: Ct Chest W Contrast  Result Date: 09/01/2017 CLINICAL DATA:  Right-sided lung cancer. EXAM: CT CHEST WITH CONTRAST TECHNIQUE: Multidetector CT imaging of the chest was performed during intravenous contrast administration. CONTRAST:  41mL OMNIPAQUE IOHEXOL 300 MG/ML  SOLN COMPARISON:  06/08/2017 FINDINGS: Cardiovascular: The heart size is normal. No pericardial effusion. Coronary artery  calcification is evident. Atherosclerotic calcification is noted in the wall of the thoracic aorta. Mediastinum/Nodes: No mediastinal lymphadenopathy. 10 mm short axis subcarinal lymph node identified on the previous study is now 5 mm short axis. There is no hilar lymphadenopathy. Mild circumferential wall thickening is noted in the mid esophagus (image 73/series 2). There is no axillary lymphadenopathy. Small bilateral thyroid nodules are evident, not seen on the previous noncontrast study. Lungs/Pleura: The central tracheobronchial airways are patent. Centrilobular emphysema noted. Right lower lobe nodule measured previously at 3.4 x 1.9 cm now measures 1.2 x 0.7 cm. 7 mm posterior right lower lobe nodule is stable at 7 mm (image 117/7). New small ground-glass nodules are identified in the central right upper lobe (52/7), anterior right lower lobe (62/7) and central right lower lobe (77/7). Upper Abdomen: 9 mm low-density lesion in the lateral segment left liver on the prior study is 11 mm today. Another tiny hypoattenuating lesion in the posterior right liver (128/2) is unchanged. Musculoskeletal: Bone windows reveal no worrisome lytic or sclerotic osseous lesions. IMPRESSION: 1. Interval decrease in the dominant right lower lobe pulmonary lesion. 2. Stable 7 mm right lower lobe nodule with new scattered small ground-glass nodules in the right lung. The ground-glass opacities may reflect infectious/inflammatory alveolitis or post radiation change. Attention on follow-up recommended. 3. Mild circumferential wall thickening in the mid esophagus suggests esophagitis. 4.  Aortic Atherosclerois (ICD10-170.0) 5.  Emphysema. (ICD10-J43.9). Electronically Signed   By: Misty Stanley M.D.   On: 09/01/2017 14:03    Impression/Plan: 1. 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  The patient is clinically doing well. She will return to Dr. Julien Nordmann at the end of the month for continued immunotherapy. We  will see her as needed as well moving forward. She was given precautions to call if she has questions or concerns.       Carola Rhine, PAC

## 2017-09-21 NOTE — Patient Instructions (Signed)
[facility] Discharge Instructions for Patients Receiving Chemotherapy  Today you received the following chemotherapy agents imfinzi   To help prevent nausea and vomiting after your treatment, we encourage you to take your nausea medication as directed  If you develop nausea and vomiting that is not controlled by your nausea medication, call the clinic.   BELOW ARE SYMPTOMS THAT SHOULD BE REPORTED IMMEDIATELY:  *FEVER GREATER THAN 100.5 F  *CHILLS WITH OR WITHOUT FEVER  NAUSEA AND VOMITING THAT IS NOT CONTROLLED WITH YOUR NAUSEA MEDICATION  *UNUSUAL SHORTNESS OF BREATH  *UNUSUAL BRUISING OR BLEEDING  TENDERNESS IN MOUTH AND THROAT WITH OR WITHOUT PRESENCE OF ULCERS  *URINARY PROBLEMS  *BOWEL PROBLEMS  UNUSUAL RASH Items with * indicate a potential emergency and should be followed up as soon as possible.  Feel free to call the clinic you have any questions or concerns. The clinic phone number is [phone].

## 2017-09-23 ENCOUNTER — Ambulatory Visit: Payer: PPO

## 2017-09-23 ENCOUNTER — Other Ambulatory Visit: Payer: PPO

## 2017-09-24 ENCOUNTER — Telehealth: Payer: Self-pay | Admitting: Emergency Medicine

## 2017-09-24 NOTE — Telephone Encounter (Signed)
First time chemo follow up call.  Pt denies any N/V/D or fevers.  Reports mild fatigue but nothing really out of her baseline.  Pt also asked about a refill for her restoril for sleep.  Verbalized understanding that she can call any time with questions or concerns.

## 2017-10-07 ENCOUNTER — Inpatient Hospital Stay (HOSPITAL_BASED_OUTPATIENT_CLINIC_OR_DEPARTMENT_OTHER): Payer: PPO | Admitting: Oncology

## 2017-10-07 ENCOUNTER — Inpatient Hospital Stay: Payer: PPO

## 2017-10-07 ENCOUNTER — Telehealth: Payer: Self-pay | Admitting: Internal Medicine

## 2017-10-07 ENCOUNTER — Encounter: Payer: Self-pay | Admitting: Oncology

## 2017-10-07 VITALS — BP 122/65 | HR 100 | Temp 98.3°F | Resp 17 | Ht 59.0 in | Wt 120.2 lb

## 2017-10-07 DIAGNOSIS — E119 Type 2 diabetes mellitus without complications: Secondary | ICD-10-CM | POA: Diagnosis not present

## 2017-10-07 DIAGNOSIS — Z5112 Encounter for antineoplastic immunotherapy: Secondary | ICD-10-CM | POA: Diagnosis not present

## 2017-10-07 DIAGNOSIS — C3431 Malignant neoplasm of lower lobe, right bronchus or lung: Secondary | ICD-10-CM | POA: Diagnosis not present

## 2017-10-07 DIAGNOSIS — Z79899 Other long term (current) drug therapy: Secondary | ICD-10-CM | POA: Diagnosis not present

## 2017-10-07 DIAGNOSIS — J439 Emphysema, unspecified: Secondary | ICD-10-CM | POA: Diagnosis not present

## 2017-10-07 DIAGNOSIS — F17210 Nicotine dependence, cigarettes, uncomplicated: Secondary | ICD-10-CM

## 2017-10-07 DIAGNOSIS — I700 Atherosclerosis of aorta: Secondary | ICD-10-CM | POA: Diagnosis not present

## 2017-10-07 DIAGNOSIS — K229 Disease of esophagus, unspecified: Secondary | ICD-10-CM | POA: Diagnosis not present

## 2017-10-07 DIAGNOSIS — M129 Arthropathy, unspecified: Secondary | ICD-10-CM | POA: Diagnosis not present

## 2017-10-07 DIAGNOSIS — R918 Other nonspecific abnormal finding of lung field: Secondary | ICD-10-CM

## 2017-10-07 LAB — CBC WITH DIFFERENTIAL (CANCER CENTER ONLY)
Basophils Absolute: 0 10*3/uL (ref 0.0–0.1)
Basophils Relative: 0 %
Eosinophils Absolute: 0.1 10*3/uL (ref 0.0–0.5)
Eosinophils Relative: 1 %
HCT: 38.2 % (ref 34.8–46.6)
Hemoglobin: 12.7 g/dL (ref 11.6–15.9)
Lymphocytes Relative: 21 %
Lymphs Abs: 1.3 10*3/uL (ref 0.9–3.3)
MCH: 32.9 pg (ref 25.1–34.0)
MCHC: 33.2 g/dL (ref 31.5–36.0)
MCV: 99 fL (ref 79.5–101.0)
Monocytes Absolute: 0.6 10*3/uL (ref 0.1–0.9)
Monocytes Relative: 10 %
Neutro Abs: 4.3 10*3/uL (ref 1.5–6.5)
Neutrophils Relative %: 68 %
Platelet Count: 368 10*3/uL (ref 145–400)
RBC: 3.86 MIL/uL (ref 3.70–5.45)
RDW: 13.1 % (ref 11.2–14.5)
WBC Count: 6.3 10*3/uL (ref 3.9–10.3)

## 2017-10-07 LAB — CMP (CANCER CENTER ONLY)
ALT: 26 U/L (ref 0–44)
AST: 22 U/L (ref 15–41)
Albumin: 3.9 g/dL (ref 3.5–5.0)
Alkaline Phosphatase: 104 U/L (ref 38–126)
Anion gap: 10 (ref 5–15)
BUN: 14 mg/dL (ref 8–23)
CO2: 24 mmol/L (ref 22–32)
Calcium: 10.5 mg/dL — ABNORMAL HIGH (ref 8.9–10.3)
Chloride: 106 mmol/L (ref 98–111)
Creatinine: 0.83 mg/dL (ref 0.44–1.00)
GFR, Est AFR Am: >60 mL/min (ref >60)
GFR, Estimated: >60 mL/min (ref >60)
Glucose, Bld: 109 mg/dL — ABNORMAL HIGH (ref 70–99)
Potassium: 4.5 mmol/L (ref 3.5–5.1)
Sodium: 140 mmol/L (ref 135–145)
Total Bilirubin: 0.4 mg/dL (ref 0.3–1.2)
Total Protein: 7.7 g/dL (ref 6.5–8.1)

## 2017-10-07 MED ORDER — SODIUM CHLORIDE 0.9 % IV SOLN
Freq: Once | INTRAVENOUS | Status: AC
  Administered 2017-10-07: 15:00:00 via INTRAVENOUS

## 2017-10-07 MED ORDER — SODIUM CHLORIDE 0.9 % IV SOLN
10.6000 mg/kg | Freq: Once | INTRAVENOUS | Status: AC
  Administered 2017-10-07: 600 mg via INTRAVENOUS
  Filled 2017-10-07: qty 10

## 2017-10-07 NOTE — Progress Notes (Signed)
[facility] OFFICE PROGRESS NOTE  Katelyn Sanders, MD [place] [address]  DIAGNOSIS: Stage IV (T2a,N2, M0) non-small cell lung cancer,adenocarcinomapresenting with right lower lobe lung mass in addition to subcarinal lymphadenopathy and suspicious metastatic pulmonary nodules diagnosed in February 2019.  PRIOR THERAPY: Concurrent chemoradiation with carboplatin for an AUC of 2 with paclitaxel 45 mg/m weekly. First dosegiven on3/18/2019.  Status post 7 cycles.  Last dose was giving August 09, 2017 with partial response.  CURRENT THERAPY: Consolidation immunotherapy was Imfinzi (Durvalumab) 10 mg/KG every 2 weeks, first dose 09/21/2017.  Status post 1 cycle.  INTERVAL HISTORY: Katelyn Kennedy 72 y.o. female returns for routine follow-up visit by herself.  The patient is feeling fine today with no specific complaints.  She tolerated the first dose of immunotherapy fairly well.  She noticed one night where she had sweating on her head only.  This is not been recurrent.  She denies fevers and chills.  Denies chest pain, shortness breath, cough, hemoptysis.  Denies nausea, vomiting, constipation, diarrhea.  Denies skin rashes.  The patient reports that she is eating healthier and has lost a few pounds.  She reports that her appetite has been good.  Patient is here for evaluation prior to cycle #2 over treatment.  MEDICAL HISTORY: Past Medical History:  Diagnosis Date  . Allergic rhinitis   . Arthritis   . Diabetes mellitus without complication (HCC)    diet control/no meds per pt  . Dyspnea   . History of chicken pox   . Smoker     ALLERGIES:  is allergic to codeine.  MEDICATIONS:  Current Outpatient Medications  Medication Sig Dispense Refill  . acetaminophen (TYLENOL) 500 MG tablet Take 500 mg by mouth daily as needed for moderate pain or headache.    Marland Kitchen atorvastatin (LIPITOR) 20 MG tablet TAKE 1 TABLET BY MOUTH EVERY DAY 90 tablet 0  . Calcium  Carbonate-Vitamin D (CALCIUM + D) 600-200 MG-UNIT per tablet Take 1 tablet by mouth daily.     . fluticasone (FLONASE) 50 MCG/ACT nasal spray PLACE 2 SPRAYS INTO THE NOSE DAILY. (Patient not taking: Reported on 09/21/2017) 16 g 5  . lisinopril (PRINIVIL,ZESTRIL) 5 MG tablet Take 0.5 tablets (2.5 mg total) by mouth daily. 45 tablet 3  . Multiple Vitamin (MULTIVITAMIN) capsule Take 1 capsule by mouth daily.      . prochlorperazine (COMPAZINE) 10 MG tablet Take 1 tablet (10 mg total) by mouth every 6 (six) hours as needed for nausea or vomiting. (Patient not taking: Reported on 09/21/2017) 30 tablet 0  . sucralfate (CARAFATE) 1 g tablet Take 1 tablet (1 g total) by mouth 4 (four) times daily. (Patient not taking: Reported on 09/21/2017) 120 tablet 2  . temazepam (RESTORIL) 15 MG capsule Take 1 capsule (15 mg total) by mouth at bedtime as needed for sleep. 30 capsule 0  . Tetrahydrozoline HCl (VISINE OP) Place 1 drop into both eyes daily as needed (for dry eyes).     . traMADol (ULTRAM) 50 MG tablet Take 1 tablet (50 mg total) by mouth every 6 (six) hours as needed for moderate pain. (Patient not taking: Reported on 09/21/2017) 15 tablet 0   No current facility-administered medications for this visit.    Facility-Administered Medications Ordered in Other Visits  Medication Dose Route Frequency Provider Last Rate Last Dose  . durvalumab (IMFINZI) 600 mg in sodium chloride 0.9 % 100 mL chemo infusion  10.6 mg/kg (Treatment Plan Recorded)  Intravenous Once Curt Bears, MD 112 mL/hr at 10/07/17 1509 600 mg at 10/07/17 1509    SURGICAL HISTORY:  Past Surgical History:  Procedure Laterality Date  . ANKLE FRACTURE SURGERY Left 10 years ago   "hard to wake up from anesthesia" per pt  . [place]  . BREAST BIOPSY Right 1978   BENIGN CYST  . BREAST EXCISIONAL BIOPSY Left 2011   Benign  . BREAST EXCISIONAL BIOPSY Right 1985   Benign   . CATARACT EXTRACTION    . CATARACT EXTRACTION  W/ INTRAOCULAR LENS IMPLANT Bilateral   . COLONOSCOPY  2011  . ELECTROMAGNETIC NAVIGATION BROCHOSCOPY N/A 06/08/2017   Procedure: ELECTROMAGNETIC NAVIGATION BRONCHOSCOPY;  Surgeon: Flora Lipps, MD;  Location: [facility] ORS;  Service: Cardiopulmonary;  Laterality: N/A;  . POLYPECTOMY  2011  . 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 TUBAL LIGATION      REVIEW OF SYSTEMS:   Review of Systems  Constitutional: Negative for appetite change, chills, fatigue, fever and unexpected weight change.  HENT:   Negative for mouth sores, nosebleeds, sore throat and trouble swallowing.   Eyes: Negative for eye problems and icterus.  Respiratory: Negative for cough, hemoptysis, shortness of breath and wheezing.   Cardiovascular: Negative for chest pain and leg swelling.  Gastrointestinal: Negative for abdominal pain, constipation, diarrhea, nausea and vomiting.  Genitourinary: Negative for bladder incontinence, difficulty urinating, dysuria, frequency and hematuria.   Musculoskeletal: Negative for back pain, gait problem, neck pain and neck stiffness.  Skin: Negative for itching and rash.  Neurological: Negative for dizziness, extremity weakness, gait problem, headaches, light-headedness and seizures.  Hematological: Negative for adenopathy. Does not bruise/bleed easily.  Psychiatric/Behavioral: Negative for confusion, depression and sleep disturbance. The patient is not nervous/anxious.     PHYSICAL EXAMINATION:  Blood pressure 122/65, pulse 100, temperature 98.3 F (36.8 C), temperature source Oral, resp. rate 17, height 4\' 11"  (1.499 m), weight 120 lb 3.2 oz (54.5 kg), SpO2 94 %.  ECOG PERFORMANCE STATUS: 1 - Symptomatic but completely ambulatory  Physical Exam  Constitutional: Oriented to person, place, and  time and well-developed, well-nourished, and in no distress. No distress.  HENT:  Head: Normocephalic and atraumatic.  Mouth/Throat: Oropharynx is clear and moist. No oropharyngeal exudate.  Eyes: Conjunctivae are normal. Right eye exhibits no discharge. Left eye exhibits no discharge. No scleral icterus.  Neck: Normal range of motion. Neck supple.  Cardiovascular: Normal rate, regular rhythm, normal heart sounds and intact distal pulses.   Pulmonary/Chest: Effort normal and breath sounds normal. No respiratory distress. No wheezes. No rales.  Abdominal: Soft. Bowel sounds are normal. Exhibits no distension and no mass. There is no tenderness.  Musculoskeletal: Normal range of motion. Exhibits no edema.  Lymphadenopathy:    No cervical adenopathy.  Neurological: Alert and oriented to person, place, and time. Exhibits normal muscle tone. Gait normal. Coordination normal.  Skin: Skin is warm and dry. No rash noted. Not diaphoretic. No erythema. No pallor.  Psychiatric: Mood, memory and judgment normal.  Vitals reviewed.  LABORATORY DATA: Lab Results  Component Value Date   WBC 6.3 10/07/2017   HGB 12.7 10/07/2017   HCT 38.2 10/07/2017   MCV 99.0 10/07/2017   PLT 368 10/07/2017      Chemistry      Component Value Date/Time  NA 140 10/07/2017 1246   K 4.5 10/07/2017 1246   CL 106 10/07/2017 1246   CO2 24 10/07/2017 1246   BUN 14 10/07/2017 1246   CREATININE 0.83 10/07/2017 1246      Component Value Date/Time   CALCIUM 10.5 (H) 10/07/2017 1246   ALKPHOS 104 10/07/2017 1246   AST 22 10/07/2017 1246   ALT 26 10/07/2017 1246   BILITOT 0.4 10/07/2017 1246       RADIOGRAPHIC STUDIES:  No results found.   ASSESSMENT/PLAN:  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 ([place]) This is a very pleasant 72 year old white female with suspicious for stage IIIA non-small cell lung cancer, adenocarcinoma presented mainly with right lower lobe lung mass in addition to  subcarinal lymphadenopathy but there was suspicious metastatic pulmonary nodules. The patient completed a course of concurrent chemoradiation with weekly carboplatin and paclitaxel.  She tolerated her treatment well with no concerning complaints. The patient is currently on consolidation immunotherapy with Imfinzi every 2 weeks.  She tolerated the first cycle well with no concerning complaints.  Recommend for her to proceed with cycle 2 as scheduled today. She will follow-up in 2 weeks for evaluation prior to cycle #3.  The patient was advised to call immediately if she has any concerning symptoms in the interval. All questions were answered. The patient knows to call the clinic with any problems, questions or concerns. We can certainly see the patient much sooner if necessary.   No orders of the defined types were placed in this encounter.  Mikey Bussing, DNP, AGPCNP-BC, AOCNP 10/07/17

## 2017-10-07 NOTE — Assessment & Plan Note (Addendum)
This is a very pleasant 72 year old white female with suspicious for stage IIIA non-small cell lung cancer, adenocarcinoma presented mainly with right lower lobe lung mass in addition to subcarinal lymphadenopathy but there was suspicious metastatic pulmonary nodules. The patient completed a course of concurrent chemoradiation with weekly carboplatin and paclitaxel.  She tolerated her treatment well with no concerning complaints. The patient is currently on consolidation immunotherapy with Imfinzi every 2 weeks.  She tolerated the first cycle well with no concerning complaints.  Recommend for her to proceed with cycle 2 as scheduled today. She will follow-up in 2 weeks for evaluation prior to cycle #3.  The patient was advised to call immediately if she has any concerning symptoms in the interval. All questions were answered. The patient knows to call the clinic with any problems, questions or concerns. We can certainly see the patient much sooner if necessary.

## 2017-10-07 NOTE — Telephone Encounter (Signed)
Scheduled appt per 6/27 los - pt to get an updated schedule in treatment area.

## 2017-10-20 NOTE — Progress Notes (Signed)
[facility] OFFICE PROGRESS NOTE  Katelyn Sanders, MD [place] [address]  DIAGNOSIS: Stage IV (T2a,N2, M0) non-small cell lung cancer,adenocarcinomapresenting with right lower lobe lung mass in addition to subcarinal lymphadenopathy and suspicious metastatic pulmonary nodulesdiagnosed in February 2019.  PRIOR THERAPY: Concurrent chemoradiation with carboplatin for an AUC of 2 with paclitaxel 45 mg/m weekly. First dosegiven on3/18/2019. Status post 7cycles.Last dose was giving August 09, 2017 with partial response.  CURRENT THERAPY: Consolidation immunotherapy was Imfinzi (Durvalumab) 10 mg/KG every 2 weeks, first dose 09/21/2017.  Status post 3 cycles.  INTERVAL HISTORY: Katelyn Kennedy 72 y.o. female returns for routine follow-up visit by herself.  The patient is feeling fine today with no specific complaints.  She continues to tolerate treatment with immunotherapy fairly well.  She denies fevers and chills.  Denies chest pain, shortness breath, cough, hemoptysis.  Denies nausea, vomiting, constipation, diarrhea.  Denies skin rashes.  Denies recent weight loss or night sweats.  The patient is here for evaluation prior to cycle #3 of her treatment.  MEDICAL HISTORY: Past Medical History:  Diagnosis Date  . Allergic rhinitis   . Arthritis   . Diabetes mellitus without complication (HCC)    diet control/no meds per pt  . Dyspnea   . History of chicken pox   . Smoker     ALLERGIES:  is allergic to codeine.  MEDICATIONS:  Current Outpatient Medications  Medication Sig Dispense Refill  . acetaminophen (TYLENOL) 500 MG tablet Take 500 mg by mouth daily as needed for moderate pain or headache.    Marland Kitchen atorvastatin (LIPITOR) 20 MG tablet TAKE 1 TABLET BY MOUTH EVERY DAY 90 tablet 0  . diphenhydramine-acetaminophen (TYLENOL PM) 25-500 MG TABS tablet Take 2 tablets by mouth at bedtime as needed.    Marland Kitchen lisinopril (PRINIVIL,ZESTRIL) 5 MG tablet  Take 0.5 tablets (2.5 mg total) by mouth daily. 45 tablet 3  . Multiple Vitamin (MULTIVITAMIN) capsule Take 1 capsule by mouth daily.      . Tetrahydrozoline HCl (VISINE OP) Place 1 drop into both eyes daily as needed (for dry eyes).     . traMADol (ULTRAM) 50 MG tablet Take 1 tablet (50 mg total) by mouth every 6 (six) hours as needed for moderate pain. 15 tablet 0  . Calcium Carbonate-Vitamin D (CALCIUM + D) 600-200 MG-UNIT per tablet Take 1 tablet by mouth daily.     . fluticasone (FLONASE) 50 MCG/ACT nasal spray PLACE 2 SPRAYS INTO THE NOSE DAILY. (Patient not taking: Reported on 09/21/2017) 16 g 5  . prochlorperazine (COMPAZINE) 10 MG tablet Take 1 tablet (10 mg total) by mouth every 6 (six) hours as needed for nausea or vomiting. (Patient not taking: Reported on 09/21/2017) 30 tablet 0  . sucralfate (CARAFATE) 1 g tablet Take 1 tablet (1 g total) by mouth 4 (four) times daily. (Patient not taking: Reported on 09/21/2017) 120 tablet 2  . temazepam (RESTORIL) 15 MG capsule Take 1 capsule (15 mg total) by mouth at bedtime as needed for sleep. (Patient not taking: Reported on 10/21/2017) 30 capsule 0   No current facility-administered medications for this visit.    Facility-Administered Medications Ordered in Other Visits  Medication Dose Route Frequency Provider Last Rate Last Dose  . durvalumab (IMFINZI) 600 mg in sodium chloride 0.9 % 100 mL chemo infusion  10.5 mg/kg (Treatment Plan Recorded) Intravenous Once Curt Bears, MD 112 mL/hr at 10/21/17 1026 600 mg at 10/21/17 1026  SURGICAL HISTORY:  Past Surgical History:  Procedure Laterality Date  . ANKLE FRACTURE SURGERY Left 10 years ago   "hard to wake up from anesthesia" per pt  . [place]  . BREAST BIOPSY Right 1978   BENIGN CYST  . BREAST EXCISIONAL BIOPSY Left 2011   Benign  . BREAST EXCISIONAL BIOPSY Right 1985   Benign   . CATARACT EXTRACTION    . CATARACT EXTRACTION W/ INTRAOCULAR LENS IMPLANT  Bilateral   . COLONOSCOPY  2011  . ELECTROMAGNETIC NAVIGATION BROCHOSCOPY N/A 06/08/2017   Procedure: ELECTROMAGNETIC NAVIGATION BRONCHOSCOPY;  Surgeon: Flora Lipps, MD;  Location: [facility] ORS;  Service: Cardiopulmonary;  Laterality: N/A;  . POLYPECTOMY  2011  . 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 TUBAL LIGATION      REVIEW OF SYSTEMS:   Review of Systems  Constitutional: Negative for appetite change, chills, fatigue, fever and unexpected weight change.  HENT:   Negative for mouth sores, nosebleeds, sore throat and trouble swallowing.   Eyes: Negative for eye problems and icterus.  Respiratory: Negative for cough, hemoptysis, shortness of breath and wheezing.   Cardiovascular: Negative for chest pain and leg swelling.  Gastrointestinal: Negative for abdominal pain, constipation, diarrhea, nausea and vomiting.  Genitourinary: Negative for bladder incontinence, difficulty urinating, dysuria, frequency and hematuria.   Musculoskeletal: Negative for back pain, gait problem, neck pain and neck stiffness.  Skin: Negative for itching and rash.  Neurological: Negative for dizziness, extremity weakness, gait problem, headaches, light-headedness and seizures.  Hematological: Negative for adenopathy. Does not bruise/bleed easily.  Psychiatric/Behavioral: Negative for confusion, depression and sleep disturbance. The patient is not nervous/anxious.     PHYSICAL EXAMINATION:  Blood pressure 135/70, pulse 91, temperature 98.2 F (36.8 C), temperature source Oral, resp. rate 18, height 4\' 11"  (1.499 m), weight 123 lb 11.2 oz (56.1 kg), SpO2 95 %.  ECOG PERFORMANCE STATUS: 1 - Symptomatic but completely ambulatory  Physical Exam  Constitutional: Oriented to person, place, and time and well-developed,  well-nourished, and in no distress. No distress.  HENT:  Head: Normocephalic and atraumatic.  Mouth/Throat: Oropharynx is clear and moist. No oropharyngeal exudate.  Eyes: Conjunctivae are normal. Right eye exhibits no discharge. Left eye exhibits no discharge. No scleral icterus.  Neck: Normal range of motion. Neck supple.  Cardiovascular: Normal rate, regular rhythm, normal heart sounds and intact distal pulses.   Pulmonary/Chest: Effort normal and breath sounds normal. No respiratory distress. No wheezes. No rales.  Abdominal: Soft. Bowel sounds are normal. Exhibits no distension and no mass. There is no tenderness.  Musculoskeletal: Normal range of motion. Exhibits no edema.  Lymphadenopathy:    No cervical adenopathy.  Neurological: Alert and oriented to person, place, and time. Exhibits normal muscle tone. Gait normal. Coordination normal.  Skin: Skin is warm and dry. No rash noted. Not diaphoretic. No erythema. No pallor.  Psychiatric: Mood, memory and judgment normal.  Vitals reviewed.  LABORATORY DATA: Lab Results  Component Value Date   WBC 6.1 10/21/2017   HGB 13.0 10/21/2017   HCT 38.8 10/21/2017   MCV 98.7 10/21/2017   PLT 206 10/21/2017      Chemistry      Component Value Date/Time   NA 140 10/21/2017 0821   K 4.2 10/21/2017 0821   CL 107 10/21/2017 5035  CO2 23 10/21/2017 0821   BUN 14 10/21/2017 0821   CREATININE 0.84 10/21/2017 0821      Component Value Date/Time   CALCIUM 10.1 10/21/2017 0821   ALKPHOS 104 10/21/2017 0821   AST 26 10/21/2017 0821   ALT 27 10/21/2017 0821   BILITOT 0.3 10/21/2017 0821       RADIOGRAPHIC STUDIES:  No results found.   ASSESSMENT/PLAN:  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 [facility]) This is a very pleasant 72 year old white female with suspicious for stage IIIAnon-small cell lung cancer, adenocarcinoma presented mainly with right lower lobe lung mass in addition to subcarinal lymphadenopathy but  there was suspicious metastatic pulmonary nodules. The patient completed a course of concurrent chemoradiation with weekly carboplatin and paclitaxel. She tolerated her treatment well with no concerning complaints. The patient is currently on consolidation immunotherapy with Imfinzi every 2 weeks.  She tolerated the first cycle well with no concerning complaints. Recommend for her to proceed with cycle 3 as scheduled today. She will follow-up in 2 weeks for evaluation prior to cycle #4.  The patient was advised to call immediately if she has any concerning symptoms in the interval. All questions were answered. The patient knows to call the clinic with any problems, questions or concerns. We can certainly see the patient much sooner if necessary.   No orders of the defined types were placed in this encounter.  Mikey Bussing, DNP, AGPCNP-BC, AOCNP 10/21/17

## 2017-10-20 NOTE — Assessment & Plan Note (Addendum)
This is a very pleasant 72 year old white female with suspicious for stage IIIAnon-small cell lung cancer, adenocarcinoma presented mainly with right lower lobe lung mass in addition to subcarinal lymphadenopathy but there was suspicious metastatic pulmonary nodules. The patient completed a course of concurrent chemoradiation with weekly carboplatin and paclitaxel. She tolerated her treatment well with no concerning complaints. The patient is currently on consolidation immunotherapy with Imfinzi every 2 weeks.  She tolerated the first cycle well with no concerning complaints. Recommend for her to proceed with cycle 3 as scheduled today. She will follow-up in 2 weeks for evaluation prior to cycle #4.  The patient was advised to call immediately if she has any concerning symptoms in the interval. All questions were answered. The patient knows to call the clinic with any problems, questions or concerns. We can certainly see the patient much sooner if necessary.

## 2017-10-21 ENCOUNTER — Inpatient Hospital Stay (HOSPITAL_BASED_OUTPATIENT_CLINIC_OR_DEPARTMENT_OTHER): Payer: PPO | Admitting: Oncology

## 2017-10-21 ENCOUNTER — Encounter: Payer: Self-pay | Admitting: Oncology

## 2017-10-21 ENCOUNTER — Inpatient Hospital Stay: Payer: PPO | Attending: Internal Medicine

## 2017-10-21 ENCOUNTER — Inpatient Hospital Stay: Payer: PPO

## 2017-10-21 ENCOUNTER — Telehealth: Payer: Self-pay | Admitting: Oncology

## 2017-10-21 VITALS — BP 135/70 | HR 91 | Temp 98.2°F | Resp 18 | Ht 59.0 in | Wt 123.7 lb

## 2017-10-21 DIAGNOSIS — Z79899 Other long term (current) drug therapy: Secondary | ICD-10-CM | POA: Diagnosis not present

## 2017-10-21 DIAGNOSIS — C3431 Malignant neoplasm of lower lobe, right bronchus or lung: Secondary | ICD-10-CM

## 2017-10-21 DIAGNOSIS — E119 Type 2 diabetes mellitus without complications: Secondary | ICD-10-CM | POA: Insufficient documentation

## 2017-10-21 DIAGNOSIS — M129 Arthropathy, unspecified: Secondary | ICD-10-CM | POA: Insufficient documentation

## 2017-10-21 DIAGNOSIS — F17210 Nicotine dependence, cigarettes, uncomplicated: Secondary | ICD-10-CM | POA: Insufficient documentation

## 2017-10-21 DIAGNOSIS — Z5112 Encounter for antineoplastic immunotherapy: Secondary | ICD-10-CM

## 2017-10-21 DIAGNOSIS — R0602 Shortness of breath: Secondary | ICD-10-CM | POA: Insufficient documentation

## 2017-10-21 DIAGNOSIS — Z923 Personal history of irradiation: Secondary | ICD-10-CM

## 2017-10-21 DIAGNOSIS — C7800 Secondary malignant neoplasm of unspecified lung: Secondary | ICD-10-CM | POA: Insufficient documentation

## 2017-10-21 DIAGNOSIS — Z9221 Personal history of antineoplastic chemotherapy: Secondary | ICD-10-CM | POA: Insufficient documentation

## 2017-10-21 LAB — CMP (CANCER CENTER ONLY)
ALT: 27 U/L (ref 0–44)
AST: 26 U/L (ref 15–41)
Albumin: 4 g/dL (ref 3.5–5.0)
Alkaline Phosphatase: 104 U/L (ref 38–126)
Anion gap: 10 (ref 5–15)
BUN: 14 mg/dL (ref 8–23)
CO2: 23 mmol/L (ref 22–32)
Calcium: 10.1 mg/dL (ref 8.9–10.3)
Chloride: 107 mmol/L (ref 98–111)
Creatinine: 0.84 mg/dL (ref 0.44–1.00)
GFR, Est AFR Am: >60 mL/min (ref >60)
GFR, Estimated: >60 mL/min (ref >60)
Glucose, Bld: 107 mg/dL — ABNORMAL HIGH (ref 70–99)
Potassium: 4.2 mmol/L (ref 3.5–5.1)
Sodium: 140 mmol/L (ref 135–145)
Total Bilirubin: 0.3 mg/dL (ref 0.3–1.2)
Total Protein: 7.4 g/dL (ref 6.5–8.1)

## 2017-10-21 LAB — CBC WITH DIFFERENTIAL (CANCER CENTER ONLY)
Basophils Absolute: 0 10*3/uL (ref 0.0–0.1)
Basophils Relative: 0 %
Eosinophils Absolute: 0.1 10*3/uL (ref 0.0–0.5)
Eosinophils Relative: 2 %
HCT: 38.8 % (ref 34.8–46.6)
Hemoglobin: 13 g/dL (ref 11.6–15.9)
Lymphocytes Relative: 19 %
Lymphs Abs: 1.2 10*3/uL (ref 0.9–3.3)
MCH: 33.1 pg (ref 25.1–34.0)
MCHC: 33.5 g/dL (ref 31.5–36.0)
MCV: 98.7 fL (ref 79.5–101.0)
Monocytes Absolute: 0.6 10*3/uL (ref 0.1–0.9)
Monocytes Relative: 10 %
Neutro Abs: 4.1 10*3/uL (ref 1.5–6.5)
Neutrophils Relative %: 69 %
Platelet Count: 206 10*3/uL (ref 145–400)
RBC: 3.93 MIL/uL (ref 3.70–5.45)
RDW: 13.1 % (ref 11.2–14.5)
WBC Count: 6.1 10*3/uL (ref 3.9–10.3)

## 2017-10-21 LAB — TSH: TSH: 2.287 u[IU]/mL (ref 0.308–3.960)

## 2017-10-21 MED ORDER — SODIUM CHLORIDE 0.9 % IV SOLN
Freq: Once | INTRAVENOUS | Status: AC
  Administered 2017-10-21: 10:00:00 via INTRAVENOUS

## 2017-10-21 MED ORDER — SODIUM CHLORIDE 0.9 % IV SOLN
10.5000 mg/kg | Freq: Once | INTRAVENOUS | Status: AC
  Administered 2017-10-21: 600 mg via INTRAVENOUS
  Filled 2017-10-21: qty 10

## 2017-10-21 NOTE — Telephone Encounter (Signed)
3 cycles of treatment already scheduled per 7/11 los. No additional appts added.

## 2017-10-21 NOTE — Patient Instructions (Signed)
[facility] Discharge Instructions for Patients Receiving Chemotherapy  Today you received the following chemotherapy agents:  IMFINZI.  To help prevent nausea and vomiting after your treatment, we encourage you to take your nausea medication as directed.   If you develop nausea and vomiting that is not controlled by your nausea medication, call the clinic.   BELOW ARE SYMPTOMS THAT SHOULD BE REPORTED IMMEDIATELY:  *FEVER GREATER THAN 100.5 F  *CHILLS WITH OR WITHOUT FEVER  NAUSEA AND VOMITING THAT IS NOT CONTROLLED WITH YOUR NAUSEA MEDICATION  *UNUSUAL SHORTNESS OF BREATH  *UNUSUAL BRUISING OR BLEEDING  TENDERNESS IN MOUTH AND THROAT WITH OR WITHOUT PRESENCE OF ULCERS  *URINARY PROBLEMS  *BOWEL PROBLEMS  UNUSUAL RASH Items with * indicate a potential emergency and should be followed up as soon as possible.  Feel free to call the clinic should you have any questions or concerns. The clinic phone number is [phone].  Please show the [facility] at check-in to the Emergency Department and triage nurse.

## 2017-11-04 ENCOUNTER — Inpatient Hospital Stay: Payer: PPO

## 2017-11-04 ENCOUNTER — Encounter: Payer: Self-pay | Admitting: Internal Medicine

## 2017-11-04 ENCOUNTER — Telehealth: Payer: Self-pay | Admitting: Internal Medicine

## 2017-11-04 ENCOUNTER — Inpatient Hospital Stay (HOSPITAL_BASED_OUTPATIENT_CLINIC_OR_DEPARTMENT_OTHER): Payer: PPO | Admitting: Internal Medicine

## 2017-11-04 VITALS — BP 133/64 | HR 92 | Temp 98.5°F | Resp 18 | Ht 59.0 in | Wt 121.6 lb

## 2017-11-04 DIAGNOSIS — C7800 Secondary malignant neoplasm of unspecified lung: Secondary | ICD-10-CM | POA: Diagnosis not present

## 2017-11-04 DIAGNOSIS — C3431 Malignant neoplasm of lower lobe, right bronchus or lung: Secondary | ICD-10-CM

## 2017-11-04 DIAGNOSIS — R0602 Shortness of breath: Secondary | ICD-10-CM

## 2017-11-04 DIAGNOSIS — Z923 Personal history of irradiation: Secondary | ICD-10-CM | POA: Diagnosis not present

## 2017-11-04 DIAGNOSIS — Z5112 Encounter for antineoplastic immunotherapy: Secondary | ICD-10-CM

## 2017-11-04 DIAGNOSIS — M129 Arthropathy, unspecified: Secondary | ICD-10-CM

## 2017-11-04 DIAGNOSIS — E119 Type 2 diabetes mellitus without complications: Secondary | ICD-10-CM | POA: Diagnosis not present

## 2017-11-04 DIAGNOSIS — Z9221 Personal history of antineoplastic chemotherapy: Secondary | ICD-10-CM | POA: Diagnosis not present

## 2017-11-04 DIAGNOSIS — Z79899 Other long term (current) drug therapy: Secondary | ICD-10-CM

## 2017-11-04 DIAGNOSIS — F17210 Nicotine dependence, cigarettes, uncomplicated: Secondary | ICD-10-CM | POA: Diagnosis not present

## 2017-11-04 LAB — CBC WITH DIFFERENTIAL (CANCER CENTER ONLY)
Basophils Absolute: 0 10*3/uL (ref 0.0–0.1)
Basophils Relative: 0 %
Eosinophils Absolute: 0.1 10*3/uL (ref 0.0–0.5)
Eosinophils Relative: 1 %
HCT: 39.4 % (ref 34.8–46.6)
Hemoglobin: 13.3 g/dL (ref 11.6–15.9)
Lymphocytes Relative: 19 %
Lymphs Abs: 1.2 10*3/uL (ref 0.9–3.3)
MCH: 32.5 pg (ref 25.1–34.0)
MCHC: 33.8 g/dL (ref 31.5–36.0)
MCV: 96.3 fL (ref 79.5–101.0)
Monocytes Absolute: 0.6 10*3/uL (ref 0.1–0.9)
Monocytes Relative: 10 %
Neutro Abs: 4.3 10*3/uL (ref 1.5–6.5)
Neutrophils Relative %: 70 %
Platelet Count: 257 10*3/uL (ref 145–400)
RBC: 4.1 MIL/uL (ref 3.70–5.45)
RDW: 12.9 % (ref 11.2–14.5)
WBC Count: 6.2 10*3/uL (ref 3.9–10.3)

## 2017-11-04 LAB — CMP (CANCER CENTER ONLY)
ALT: 14 U/L (ref 0–44)
AST: 20 U/L (ref 15–41)
Albumin: 3.9 g/dL (ref 3.5–5.0)
Alkaline Phosphatase: 116 U/L (ref 38–126)
Anion gap: 11 (ref 5–15)
BUN: 11 mg/dL (ref 8–23)
CO2: 22 mmol/L (ref 22–32)
Calcium: 10.5 mg/dL — ABNORMAL HIGH (ref 8.9–10.3)
Chloride: 107 mmol/L (ref 98–111)
Creatinine: 0.8 mg/dL (ref 0.44–1.00)
GFR, Est AFR Am: >60 mL/min (ref >60)
GFR, Estimated: >60 mL/min (ref >60)
Glucose, Bld: 121 mg/dL — ABNORMAL HIGH (ref 70–99)
Potassium: 4.3 mmol/L (ref 3.5–5.1)
Sodium: 140 mmol/L (ref 135–145)
Total Bilirubin: 0.3 mg/dL (ref 0.3–1.2)
Total Protein: 7.7 g/dL (ref 6.5–8.1)

## 2017-11-04 MED ORDER — SODIUM CHLORIDE 0.9 % IV SOLN
Freq: Once | INTRAVENOUS | Status: AC
  Administered 2017-11-04: 11:00:00 via INTRAVENOUS
  Filled 2017-11-04: qty 250

## 2017-11-04 MED ORDER — SODIUM CHLORIDE 0.9 % IV SOLN
10.5000 mg/kg | Freq: Once | INTRAVENOUS | Status: AC
  Administered 2017-11-04: 600 mg via INTRAVENOUS
  Filled 2017-11-04: qty 12

## 2017-11-04 NOTE — Progress Notes (Signed)
[facility] [phone]   [phone]  OFFICE PROGRESS NOTE  [name], MD [place] [address]  DIAGNOSIS: Stage IIIA (T2a,N2, M0) non-small cell lung cancer,adenocarcinomapresenting with right lower lobe lung mass in addition to subcarinal lymphadenopathy and suspicious metastatic pulmonary nodules diagnosed in February 2019.  PRIOR THERAPY: Concurrent chemoradiation with carboplatin for an AUC of 2 with paclitaxel 45 mg/m weekly. First dose given on 06/28/2017.  Status post 7 cycles.  Last dose was giving August 09, 2017 with partial response.  CURRENT THERAPY: Consolidation immunotherapy with Imfinzi (Durvalumab) 10 mg/KG every 2 weeks, first dose 09/23/2017.  Status post 3 cycles.  INTERVAL HISTORY: Katelyn Kennedy 72 y.o. female returns to the clinic today for follow-up visit.  The patient is feeling fine today with no concerning complaints.  She continues to tolerate her treatment with Imfinzi (Durvalumab) fairly well.  She denied having any chest pain, shortness of breath, cough or hemoptysis.  She denied having any nausea, vomiting, diarrhea or constipation.  She has no fever or chills.  She is here today for evaluation before starting cycle #4.   MEDICAL HISTORY: Past Medical History:  Diagnosis Date  . Allergic rhinitis   . Arthritis   . Diabetes mellitus without complication (HCC)    diet control/no meds per pt  . Dyspnea   . History of chicken pox   . Smoker     ALLERGIES:  is allergic to codeine.  MEDICATIONS:  Current Outpatient Medications  Medication Sig Dispense Refill  . acetaminophen (TYLENOL) 500 MG tablet Take 500 mg by mouth daily as needed for moderate pain or headache.    Marland Kitchen atorvastatin (LIPITOR) 20 MG tablet TAKE 1 TABLET BY MOUTH EVERY DAY 90 tablet 0  . Calcium Carbonate-Vitamin D (CALCIUM + D) 600-200 MG-UNIT per tablet Take 1 tablet by mouth daily.     .  diphenhydramine-acetaminophen (TYLENOL PM) 25-500 MG TABS tablet Take 2 tablets by mouth at bedtime as needed.    . fluticasone (FLONASE) 50 MCG/ACT nasal spray PLACE 2 SPRAYS INTO THE NOSE DAILY. (Patient not taking: Reported on 09/21/2017) 16 g 5  . lisinopril (PRINIVIL,ZESTRIL) 5 MG tablet Take 0.5 tablets (2.5 mg total) by mouth daily. 45 tablet 3  . Multiple Vitamin (MULTIVITAMIN) capsule Take 1 capsule by mouth daily.      . prochlorperazine (COMPAZINE) 10 MG tablet Take 1 tablet (10 mg total) by mouth every 6 (six) hours as needed for nausea or vomiting. (Patient not taking: Reported on 09/21/2017) 30 tablet 0  . sucralfate (CARAFATE) 1 g tablet Take 1 tablet (1 g total) by mouth 4 (four) times daily. (Patient not taking: Reported on 09/21/2017) 120 tablet 2  . temazepam (RESTORIL) 15 MG capsule Take 1 capsule (15 mg total) by mouth at bedtime as needed for sleep. (Patient not taking: Reported on 10/21/2017) 30 capsule 0  . Tetrahydrozoline HCl (VISINE OP) Place 1 drop into both eyes daily as needed (for dry eyes).     . traMADol (ULTRAM) 50 MG tablet Take 1 tablet (50 mg total) by mouth every 6 (six) hours as needed for moderate pain. 15 tablet 0   No current facility-administered medications for this visit.     SURGICAL HISTORY:  Past Surgical History:  Procedure Laterality Date  . ANKLE FRACTURE SURGERY Left 10 years ago   "hard to wake up from anesthesia" per pt  . [place]  .  BREAST BIOPSY Right 1978   BENIGN CYST  . BREAST EXCISIONAL BIOPSY Left 2011   Benign  . BREAST EXCISIONAL BIOPSY Right 1985   Benign   . CATARACT EXTRACTION    . CATARACT EXTRACTION W/ INTRAOCULAR LENS IMPLANT Bilateral   . COLONOSCOPY  2011  . ELECTROMAGNETIC NAVIGATION BROCHOSCOPY N/A 06/08/2017   Procedure: ELECTROMAGNETIC NAVIGATION BRONCHOSCOPY;  Surgeon: Flora Lipps, MD;  Location: [facility] ORS;  Service: Cardiopulmonary;  Laterality: N/A;  . POLYPECTOMY  2011  . TRANSURETHRAL  RESECTION OF BLADDER TUMOR WITH MITOMYCIN-C N/A 05/04/2017   Procedure: TRANSURETHRAL RESECTION OF BLADDER TUMOR WITH gemcitabine;  Surgeon: Abbie Sons, MD;  Location: [facility] ORS;  Service: Urology;  Laterality: N/A;  . TUBAL LIGATION      REVIEW OF SYSTEMS:  A comprehensive review of systems was negative.   PHYSICAL EXAMINATION: General appearance: alert, cooperative and no distress Head: Normocephalic, without obvious abnormality, atraumatic Neck: no adenopathy, no JVD, supple, symmetrical, trachea midline and thyroid not enlarged, symmetric, no tenderness/mass/nodules Lymph nodes: Cervical, supraclavicular, and axillary nodes normal. Resp: clear to auscultation bilaterally Back: symmetric, no curvature. ROM normal. No CVA tenderness. Cardio: regular rate and rhythm, S1, S2 normal, no murmur, click, rub or gallop GI: soft, non-tender; bowel sounds normal; no masses,  no organomegaly Extremities: extremities normal, atraumatic, no cyanosis or edema  ECOG PERFORMANCE STATUS: 1 - Symptomatic but completely ambulatory  Blood pressure 133/64, pulse 92, temperature 98.5 F (36.9 C), temperature source Oral, resp. rate 18, height 4\' 11"  (1.499 m), weight 121 lb 9.6 oz (55.2 kg), SpO2 95 %.  LABORATORY DATA: Lab Results  Component Value Date   WBC 6.2 11/04/2017   HGB 13.3 11/04/2017   HCT 39.4 11/04/2017   MCV 96.3 11/04/2017   PLT 257 11/04/2017      Chemistry      Component Value Date/Time   NA 140 10/21/2017 0821   K 4.2 10/21/2017 0821   CL 107 10/21/2017 0821   CO2 23 10/21/2017 0821   BUN 14 10/21/2017 0821   CREATININE 0.84 10/21/2017 0821      Component Value Date/Time   CALCIUM 10.1 10/21/2017 0821   ALKPHOS 104 10/21/2017 0821   AST 26 10/21/2017 0821   ALT 27 10/21/2017 0821   BILITOT 0.3 10/21/2017 0821       RADIOGRAPHIC STUDIES: No results found.  ASSESSMENT AND PLAN: This is a very pleasant 72 years old white female with suspicious for stage IIIA  non-small cell lung cancer, adenocarcinoma presented mainly with right lower lobe lung mass in addition to subcarinal lymphadenopathy but there was suspicious metastatic pulmonary nodules. The patient completed a course of concurrent chemoradiation with weekly carboplatin and paclitaxel.  She tolerated her treatment well with no concerning complaints. The patient is currently undergoing consolidation treatment with immunotherapy with Imfinzi (Durvalumab) status post 3 cycles.  She has been tolerating this treatment well with no concerning complaints. I recommended for her to proceed with cycle #4 today as scheduled. I will see her back for follow-up visit in 3 weeks for evaluation before the next cycle of her treatment. She was advised to call immediately if she has any concerning symptoms in the interval. All questions were answered. The patient knows to call the clinic with any problems, questions or concerns. We can certainly see the patient much sooner if necessary.   Disclaimer: This note was dictated with voice recognition software. Similar sounding words can inadvertently be transcribed and may not be corrected upon review.

## 2017-11-04 NOTE — Telephone Encounter (Signed)
Scheduled appt per 7/25 los - pt to get an update scheduled next visit.

## 2017-11-04 NOTE — Patient Instructions (Signed)
[facility] Discharge Instructions for Patients Receiving Chemotherapy  Today you received the following chemotherapy agents:  IMFINZI.  To help prevent nausea and vomiting after your treatment, we encourage you to take your nausea medication as directed.   If you develop nausea and vomiting that is not controlled by your nausea medication, call the clinic.   BELOW ARE SYMPTOMS THAT SHOULD BE REPORTED IMMEDIATELY:  *FEVER GREATER THAN 100.5 F  *CHILLS WITH OR WITHOUT FEVER  NAUSEA AND VOMITING THAT IS NOT CONTROLLED WITH YOUR NAUSEA MEDICATION  *UNUSUAL SHORTNESS OF BREATH  *UNUSUAL BRUISING OR BLEEDING  TENDERNESS IN MOUTH AND THROAT WITH OR WITHOUT PRESENCE OF ULCERS  *URINARY PROBLEMS  *BOWEL PROBLEMS  UNUSUAL RASH Items with * indicate a potential emergency and should be followed up as soon as possible.  Feel free to call the clinic should you have any questions or concerns. The clinic phone number is [phone].  Please show the [facility] at check-in to the Emergency Department and triage nurse.

## 2017-11-18 ENCOUNTER — Inpatient Hospital Stay: Payer: PPO

## 2017-11-18 ENCOUNTER — Inpatient Hospital Stay: Payer: PPO | Attending: Internal Medicine

## 2017-11-18 ENCOUNTER — Telehealth: Payer: Self-pay | Admitting: Internal Medicine

## 2017-11-18 ENCOUNTER — Inpatient Hospital Stay: Payer: PPO | Admitting: Internal Medicine

## 2017-11-18 ENCOUNTER — Encounter: Payer: Self-pay | Admitting: Internal Medicine

## 2017-11-18 VITALS — BP 129/54 | HR 85 | Temp 98.6°F | Resp 18 | Ht 59.0 in | Wt 123.8 lb

## 2017-11-18 DIAGNOSIS — R0602 Shortness of breath: Secondary | ICD-10-CM

## 2017-11-18 DIAGNOSIS — R918 Other nonspecific abnormal finding of lung field: Secondary | ICD-10-CM

## 2017-11-18 DIAGNOSIS — C3431 Malignant neoplasm of lower lobe, right bronchus or lung: Secondary | ICD-10-CM

## 2017-11-18 DIAGNOSIS — F17210 Nicotine dependence, cigarettes, uncomplicated: Secondary | ICD-10-CM

## 2017-11-18 DIAGNOSIS — M129 Arthropathy, unspecified: Secondary | ICD-10-CM

## 2017-11-18 DIAGNOSIS — E119 Type 2 diabetes mellitus without complications: Secondary | ICD-10-CM | POA: Diagnosis not present

## 2017-11-18 DIAGNOSIS — Z79899 Other long term (current) drug therapy: Secondary | ICD-10-CM | POA: Insufficient documentation

## 2017-11-18 DIAGNOSIS — Z5112 Encounter for antineoplastic immunotherapy: Secondary | ICD-10-CM | POA: Diagnosis not present

## 2017-11-18 LAB — CMP (CANCER CENTER ONLY)
ALT: 16 U/L (ref 0–44)
AST: 19 U/L (ref 15–41)
Albumin: 3.7 g/dL (ref 3.5–5.0)
Alkaline Phosphatase: 103 U/L (ref 38–126)
Anion gap: 12 (ref 5–15)
BUN: 14 mg/dL (ref 8–23)
CO2: 21 mmol/L — ABNORMAL LOW (ref 22–32)
Calcium: 10 mg/dL (ref 8.9–10.3)
Chloride: 108 mmol/L (ref 98–111)
Creatinine: 0.77 mg/dL (ref 0.44–1.00)
GFR, Est AFR Am: >60 mL/min (ref >60)
GFR, Estimated: >60 mL/min (ref >60)
Glucose, Bld: 123 mg/dL — ABNORMAL HIGH (ref 70–99)
Potassium: 4.2 mmol/L (ref 3.5–5.1)
Sodium: 141 mmol/L (ref 135–145)
Total Bilirubin: 0.3 mg/dL (ref 0.3–1.2)
Total Protein: 7.5 g/dL (ref 6.5–8.1)

## 2017-11-18 LAB — CBC WITH DIFFERENTIAL (CANCER CENTER ONLY)
Basophils Absolute: 0 10*3/uL (ref 0.0–0.1)
Basophils Relative: 0 %
Eosinophils Absolute: 0.1 10*3/uL (ref 0.0–0.5)
Eosinophils Relative: 1 %
HCT: 38.6 % (ref 34.8–46.6)
Hemoglobin: 13 g/dL (ref 11.6–15.9)
Lymphocytes Relative: 19 %
Lymphs Abs: 1.1 10*3/uL (ref 0.9–3.3)
MCH: 32.4 pg (ref 25.1–34.0)
MCHC: 33.7 g/dL (ref 31.5–36.0)
MCV: 96.3 fL (ref 79.5–101.0)
Monocytes Absolute: 0.5 10*3/uL (ref 0.1–0.9)
Monocytes Relative: 10 %
Neutro Abs: 3.9 10*3/uL (ref 1.5–6.5)
Neutrophils Relative %: 70 %
Platelet Count: 274 10*3/uL (ref 145–400)
RBC: 4.01 MIL/uL (ref 3.70–5.45)
RDW: 12.6 % (ref 11.2–14.5)
WBC Count: 5.6 10*3/uL (ref 3.9–10.3)

## 2017-11-18 LAB — TSH: TSH: 2.036 u[IU]/mL (ref 0.308–3.960)

## 2017-11-18 MED ORDER — SODIUM CHLORIDE 0.9 % IV SOLN
Freq: Once | INTRAVENOUS | Status: AC
  Administered 2017-11-18: 12:00:00 via INTRAVENOUS
  Filled 2017-11-18: qty 250

## 2017-11-18 MED ORDER — SODIUM CHLORIDE 0.9 % IV SOLN
600.0000 mg | Freq: Once | INTRAVENOUS | Status: AC
  Administered 2017-11-18: 600 mg via INTRAVENOUS
  Filled 2017-11-18: qty 10

## 2017-11-18 NOTE — Progress Notes (Signed)
[place] [phone]   [phone]  OFFICE PROGRESS NOTE  [name], MD [place] [address]  DIAGNOSIS: Stage IIIA (T2a,N2, M0) non-small cell lung cancer,adenocarcinomapresenting with right lower lobe lung mass in addition to subcarinal lymphadenopathy and suspicious metastatic pulmonary nodules diagnosed in February 2019.  PRIOR THERAPY: Concurrent chemoradiation with carboplatin for an AUC of 2 with paclitaxel 45 mg/m weekly. First dose given on 06/28/2017.  Status post 7 cycles.  Last dose was giving August 09, 2017 with partial response.  CURRENT THERAPY: Consolidation immunotherapy with Imfinzi (Durvalumab) 10 mg/KG every 2 weeks, first dose 09/23/2017.  Status post 4 cycles.  INTERVAL HISTORY: Katelyn Kennedy 72 y.o. female returns to the clinic today for follow-up visit.  The patient is feeling fine today with no concerning complaints.  She continues to tolerate her treatment with Imfinzi (Durvalumab) fairly well.  She denied having any chest pain, shortness of breath, cough or hemoptysis.  She denied having any weight loss or night sweats.  She has no nausea, vomiting, diarrhea or constipation.  She denied having any fever or chills.  She is here today for evaluation before starting cycle #5.  MEDICAL HISTORY: Past Medical History:  Diagnosis Date  . Allergic rhinitis   . Arthritis   . Diabetes mellitus without complication (HCC)    diet control/no meds per pt  . Dyspnea   . History of chicken pox   . Smoker     ALLERGIES:  is allergic to codeine.  MEDICATIONS:  Current Outpatient Medications  Medication Sig Dispense Refill  . acetaminophen (TYLENOL) 500 MG tablet Take 500 mg by mouth daily as needed for moderate pain or headache.    Marland Kitchen atorvastatin (LIPITOR) 20 MG tablet TAKE 1 TABLET BY MOUTH EVERY DAY 90 tablet 0  . Calcium Carbonate-Vitamin D (CALCIUM + D) 600-200 MG-UNIT per tablet Take 1 tablet  by mouth daily.     . diphenhydramine-acetaminophen (TYLENOL PM) 25-500 MG TABS tablet Take 2 tablets by mouth at bedtime as needed.    . fluticasone (FLONASE) 50 MCG/ACT nasal spray PLACE 2 SPRAYS INTO THE NOSE DAILY. 16 g 5  . lisinopril (PRINIVIL,ZESTRIL) 5 MG tablet Take 0.5 tablets (2.5 mg total) by mouth daily. 45 tablet 3  . Multiple Vitamin (MULTIVITAMIN) capsule Take 1 capsule by mouth daily.      . prochlorperazine (COMPAZINE) 10 MG tablet Take 1 tablet (10 mg total) by mouth every 6 (six) hours as needed for nausea or vomiting. (Patient not taking: Reported on 09/21/2017) 30 tablet 0  . sucralfate (CARAFATE) 1 g tablet Take 1 tablet (1 g total) by mouth 4 (four) times daily. (Patient not taking: Reported on 09/21/2017) 120 tablet 2  . temazepam (RESTORIL) 15 MG capsule Take 1 capsule (15 mg total) by mouth at bedtime as needed for sleep. (Patient not taking: Reported on 10/21/2017) 30 capsule 0  . Tetrahydrozoline HCl (VISINE OP) Place 1 drop into both eyes daily as needed (for dry eyes).     . traMADol (ULTRAM) 50 MG tablet Take 1 tablet (50 mg total) by mouth every 6 (six) hours as needed for moderate pain. (Patient not taking: Reported on 11/04/2017) 15 tablet 0   No current facility-administered medications for this visit.     SURGICAL HISTORY:  Past Surgical History:  Procedure Laterality Date  . ANKLE FRACTURE SURGERY Left 10 years ago   "hard to wake up from anesthesia" per  pt  . [place]  . BREAST BIOPSY Right 1978   BENIGN CYST  . BREAST EXCISIONAL BIOPSY Left 2011   Benign  . BREAST EXCISIONAL BIOPSY Right 1985   Benign   . CATARACT EXTRACTION    . CATARACT EXTRACTION W/ INTRAOCULAR LENS IMPLANT Bilateral   . COLONOSCOPY  2011  . ELECTROMAGNETIC NAVIGATION BROCHOSCOPY N/A 06/08/2017   Procedure: ELECTROMAGNETIC NAVIGATION BRONCHOSCOPY;  Surgeon: Flora Lipps, MD;  Location: [facility] ORS;  Service: Cardiopulmonary;  Laterality: N/A;  . POLYPECTOMY  2011    . 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 TUBAL LIGATION      REVIEW OF SYSTEMS:  A comprehensive review of systems was negative.   PHYSICAL EXAMINATION: General appearance: alert, cooperative and no distress Head: Normocephalic, without obvious abnormality, atraumatic Neck: no adenopathy, no JVD, supple, symmetrical, trachea midline and thyroid not enlarged, symmetric, no tenderness/mass/nodules Lymph nodes: Cervical, supraclavicular, and axillary nodes normal. Resp: clear to auscultation bilaterally Back: symmetric, no curvature. ROM normal. No CVA tenderness. Cardio: regular rate and rhythm, S1, S2 normal, no murmur, click, rub or gallop GI: soft, non-tender; bowel sounds normal; no masses,  no organomegaly Extremities: extremities normal, atraumatic, no cyanosis or edema  ECOG PERFORMANCE STATUS: 1 - Symptomatic but completely ambulatory  Blood pressure (!) 129/54, pulse 85, temperature 98.6 F (37 C), temperature source Oral, resp. rate 18, height 4\' 11"  (1.499 m), weight 123 lb 12.8 oz (56.2 kg), SpO2 97 %.  LABORATORY DATA: Lab Results  Component Value Date   WBC 5.6 11/18/2017   HGB 13.0 11/18/2017   HCT 38.6 11/18/2017   MCV 96.3 11/18/2017   PLT 274 11/18/2017      Chemistry      Component Value Date/Time   NA 140 11/04/2017 0858   K 4.3 11/04/2017 0858   CL 107 11/04/2017 0858   CO2 22 11/04/2017 0858   BUN 11 11/04/2017 0858   CREATININE 0.80 11/04/2017 0858      Component Value Date/Time   CALCIUM 10.5 (H) 11/04/2017 0858   ALKPHOS 116 11/04/2017 0858   AST 20 11/04/2017 0858   ALT 14 11/04/2017 0858   BILITOT 0.3 11/04/2017 0858       RADIOGRAPHIC STUDIES: No results found.  ASSESSMENT AND PLAN: This is a very pleasant 72 years old white female with  suspicious for stage IIIA non-small cell lung cancer, adenocarcinoma presented mainly with right lower lobe lung mass in addition to subcarinal lymphadenopathy but there was suspicious metastatic pulmonary nodules. The patient completed a course of concurrent chemoradiation with weekly carboplatin and paclitaxel.  She tolerated her treatment well with no concerning complaints. The patient is currently undergoing consolidation treatment with immunotherapy with Imfinzi (Durvalumab) status post 4 cycles. The patient is tolerating this treatment well with no concerning complaints. I recommended for her to proceed with cycle #5 today. I will see her back for follow-up visit in 2 weeks for evaluation before starting cycle #6. She was advised to call immediately if she has any concerning symptoms in the interval. All questions were answered. The patient knows to call the clinic with any problems, questions or concerns. We can certainly see the patient much sooner if necessary.   Disclaimer: This note was dictated with voice recognition software. Similar sounding words can inadvertently be transcribed and  may not be corrected upon review.

## 2017-11-18 NOTE — Patient Instructions (Signed)
[facility] Discharge Instructions for Patients Receiving Chemotherapy  Today you received the following chemotherapy agents:  IMFINZI.  To help prevent nausea and vomiting after your treatment, we encourage you to take your nausea medication as directed.   If you develop nausea and vomiting that is not controlled by your nausea medication, call the clinic.   BELOW ARE SYMPTOMS THAT SHOULD BE REPORTED IMMEDIATELY:  *FEVER GREATER THAN 100.5 F  *CHILLS WITH OR WITHOUT FEVER  NAUSEA AND VOMITING THAT IS NOT CONTROLLED WITH YOUR NAUSEA MEDICATION  *UNUSUAL SHORTNESS OF BREATH  *UNUSUAL BRUISING OR BLEEDING  TENDERNESS IN MOUTH AND THROAT WITH OR WITHOUT PRESENCE OF ULCERS  *URINARY PROBLEMS  *BOWEL PROBLEMS  UNUSUAL RASH Items with * indicate a potential emergency and should be followed up as soon as possible.  Feel free to call the clinic should you have any questions or concerns. The clinic phone number is [phone].  Please show the [place] at check-in to the Emergency Department and triage nurse.

## 2017-11-18 NOTE — Telephone Encounter (Signed)
3 cycles already scheduled per 8/8 los - no additional appts added.

## 2017-12-01 NOTE — Assessment & Plan Note (Addendum)
This is a very pleasant 72 year old white female with suspicious for stage IIIA non-small cell lung cancer, adenocarcinoma presented mainly with right lower lobe lung mass in addition to subcarinal lymphadenopathy but there was suspicious metastatic pulmonary nodules. The patient completed a course of concurrent chemoradiation with weekly carboplatin and paclitaxel.  She tolerated her treatment well with no concerning complaints. The patient is currently undergoing consolidation treatment with immunotherapy with Imfinzi (Durvalumab) status post 5 cycles. The patient is tolerating this treatment well with no concerning complaints. I recommended for her to proceed with cycle #6 today. The patient will have a restaging CT scan of the chest prior to her next visit.  She will follow-up in 2 weeks for evaluation prior to cycle #7 and to review her restaging CT scan results.  She was advised to call immediately if she has any concerning symptoms in the interval. All questions were answered. The patient knows to call the clinic with any problems, questions or concerns. We can certainly see the patient much sooner if necessary.

## 2017-12-01 NOTE — Progress Notes (Signed)
[facility] OFFICE PROGRESS NOTE  [name], MD [place] [address]  DIAGNOSIS: Stage IIIA (T2a,N2, M0) non-small cell lung cancer,adenocarcinomapresenting with right lower lobe lung mass in addition to subcarinal lymphadenopathy and suspicious metastatic pulmonary nodules diagnosed in February 2019.  PRIOR THERAPY: Concurrent chemoradiation with carboplatin for an AUC of 2 with paclitaxel 45 mg/m weekly. First dosegiven on3/18/2019.  Status post 7 cycles.  Last dose was giving August 09, 2017 with partial response.  CURRENT THERAPY: Consolidation immunotherapy with Imfinzi (Durvalumab) 10 mg/KG every 2 weeks, first dose 09/23/2017.  Status post 5 cycles.  INTERVAL HISTORY: Katelyn Kennedy 72 y.o. female returns for routine follow-up visit by herself.  The patient is feeling fine today with no specific complaints.  She denies fevers and chills.  Denies chest pain, shortness of breath, cough, hemoptysis.  Denies nausea, vomiting, constipation, diarrhea.  Denies recent weight loss or night sweats.  She continues to tolerate treatment with Imfinzi fairly well.  The patient is here for evaluation prior to cycle #6 of her treatment.  MEDICAL HISTORY: Past Medical History:  Diagnosis Date  . Allergic rhinitis   . Arthritis   . Diabetes mellitus without complication (HCC)    diet control/no meds per pt  . Dyspnea   . History of chicken pox   . Smoker     ALLERGIES:  is allergic to codeine.  MEDICATIONS:  Current Outpatient Medications  Medication Sig Dispense Refill  . acetaminophen (TYLENOL) 500 MG tablet Take 500 mg by mouth daily as needed for moderate pain or headache.    Marland Kitchen atorvastatin (LIPITOR) 20 MG tablet TAKE 1 TABLET BY MOUTH EVERY DAY 90 tablet 0  . Calcium Carbonate-Vitamin D (CALCIUM + D) 600-200 MG-UNIT per tablet Take 1 tablet by mouth daily.     . diphenhydramine-acetaminophen (TYLENOL PM) 25-500 MG TABS tablet Take 2  tablets by mouth at bedtime as needed.    . fluticasone (FLONASE) 50 MCG/ACT nasal spray PLACE 2 SPRAYS INTO THE NOSE DAILY. 16 g 5  . lisinopril (PRINIVIL,ZESTRIL) 5 MG tablet Take 0.5 tablets (2.5 mg total) by mouth daily. 45 tablet 3  . Multiple Vitamin (MULTIVITAMIN) capsule Take 1 capsule by mouth daily.      . prochlorperazine (COMPAZINE) 10 MG tablet Take 1 tablet (10 mg total) by mouth every 6 (six) hours as needed for nausea or vomiting. (Patient not taking: Reported on 09/21/2017) 30 tablet 0  . sucralfate (CARAFATE) 1 g tablet Take 1 tablet (1 g total) by mouth 4 (four) times daily. (Patient not taking: Reported on 09/21/2017) 120 tablet 2  . temazepam (RESTORIL) 15 MG capsule Take 1 capsule (15 mg total) by mouth at bedtime as needed for sleep. (Patient not taking: Reported on 10/21/2017) 30 capsule 0  . Tetrahydrozoline HCl (VISINE OP) Place 1 drop into both eyes daily as needed (for dry eyes).     . traMADol (ULTRAM) 50 MG tablet Take 1 tablet (50 mg total) by mouth every 6 (six) hours as needed for moderate pain. (Patient not taking: Reported on 11/04/2017) 15 tablet 0   No current facility-administered medications for this visit.     SURGICAL HISTORY:  Past Surgical History:  Procedure Laterality Date  . ANKLE FRACTURE SURGERY Left 10 years ago   "hard to wake up from anesthesia" per pt  . [place]  . BREAST BIOPSY Right 1978   BENIGN CYST  . BREAST EXCISIONAL BIOPSY Left  2011   Benign  . BREAST EXCISIONAL BIOPSY Right 1985   Benign   . CATARACT EXTRACTION    . CATARACT EXTRACTION W/ INTRAOCULAR LENS IMPLANT Bilateral   . COLONOSCOPY  2011  . ELECTROMAGNETIC NAVIGATION BROCHOSCOPY N/A 06/08/2017   Procedure: ELECTROMAGNETIC NAVIGATION BRONCHOSCOPY;  Surgeon: Flora Lipps, MD;  Location: [facility] ORS;  Service: Cardiopulmonary;  Laterality: N/A;  . POLYPECTOMY  2011  . TRANSURETHRAL RESECTION OF BLADDER TUMOR WITH MITOMYCIN-C N/A 05/04/2017   Procedure:  TRANSURETHRAL RESECTION OF BLADDER TUMOR WITH gemcitabine;  Surgeon: Abbie Sons, MD;  Location: [facility] ORS;  Service: Urology;  Laterality: N/A;  . TUBAL LIGATION      REVIEW OF SYSTEMS:   Review of Systems  Constitutional: Negative for appetite change, chills, fatigue, fever and unexpected weight change.  HENT:   Negative for mouth sores, nosebleeds, sore throat and trouble swallowing.   Eyes: Negative for eye problems and icterus.  Respiratory: Negative for cough, hemoptysis, shortness of breath and wheezing.   Cardiovascular: Negative for chest pain and leg swelling.  Gastrointestinal: Negative for abdominal pain, constipation, diarrhea, nausea and vomiting.  Genitourinary: Negative for bladder incontinence, difficulty urinating, dysuria, frequency and hematuria.   Musculoskeletal: Negative for back pain, gait problem, neck pain and neck stiffness.  Skin: Negative for itching and rash.  Neurological: Negative for dizziness, extremity weakness, gait problem, headaches, light-headedness and seizures.  Hematological: Negative for adenopathy. Does not bruise/bleed easily.  Psychiatric/Behavioral: Negative for confusion, depression and sleep disturbance. The patient is not nervous/anxious.     PHYSICAL EXAMINATION:  Blood pressure 120/60, pulse 83, temperature 98.3 F (36.8 C), temperature source Oral, resp. rate 18, height 4\' 11"  (1.499 m), weight 124 lb (56.2 kg), SpO2 95 %.  ECOG PERFORMANCE STATUS: 1 - Symptomatic but completely ambulatory  Physical Exam  Constitutional: Oriented to person, place, and time and well-developed, well-nourished, and in no distress. No distress.  HENT:  Head: Normocephalic and atraumatic.  Mouth/Throat: Oropharynx is clear and moist. No oropharyngeal exudate.  Eyes: Conjunctivae are normal. Right eye exhibits no discharge. Left eye exhibits no discharge. No scleral icterus.  Neck: Normal range of motion. Neck supple.  Cardiovascular: Normal rate,  regular rhythm, normal heart sounds and intact distal pulses.   Pulmonary/Chest: Effort normal and breath sounds normal. No respiratory distress. No wheezes. No rales.  Abdominal: Soft. Bowel sounds are normal. Exhibits no distension and no mass. There is no tenderness.  Musculoskeletal: Normal range of motion. Exhibits no edema.  Lymphadenopathy:    No cervical adenopathy.  Neurological: Alert and oriented to person, place, and time. Exhibits normal muscle tone. Gait normal. Coordination normal.  Skin: Skin is warm and dry. No rash noted. Not diaphoretic. No erythema. No pallor.  Psychiatric: Mood, memory and judgment normal.  Vitals reviewed.  LABORATORY DATA: Lab Results  Component Value Date   WBC 6.0 12/02/2017   HGB 13.2 12/02/2017   HCT 39.7 12/02/2017   MCV 95.4 12/02/2017   PLT 226 12/02/2017      Chemistry      Component Value Date/Time   NA 140 12/02/2017 1055   K 4.4 12/02/2017 1055   CL 106 12/02/2017 1055   CO2 26 12/02/2017 1055   BUN 16 12/02/2017 1055   CREATININE 0.76 12/02/2017 1055      Component Value Date/Time   CALCIUM 10.5 (H) 12/02/2017 1055   ALKPHOS 104 12/02/2017 1055   AST 20 12/02/2017 1055   ALT 18 12/02/2017 1055   BILITOT 0.3 12/02/2017  1055       RADIOGRAPHIC STUDIES:  No results found.   ASSESSMENT/PLAN:  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 ([place]) This is a very pleasant 72 year old white female with suspicious for stage IIIA non-small cell lung cancer, adenocarcinoma presented mainly with right lower lobe lung mass in addition to subcarinal lymphadenopathy but there was suspicious metastatic pulmonary nodules. The patient completed a course of concurrent chemoradiation with weekly carboplatin and paclitaxel.  She tolerated her treatment well with no concerning complaints. The patient is currently undergoing consolidation treatment with immunotherapy with Imfinzi (Durvalumab) status post 5 cycles. The patient is  tolerating this treatment well with no concerning complaints. I recommended for her to proceed with cycle #6 today. The patient will have a restaging CT scan of the chest prior to her next visit.  She will follow-up in 2 weeks for evaluation prior to cycle #7 and to review her restaging CT scan results.  She was advised to call immediately if she has any concerning symptoms in the interval. All questions were answered. The patient knows to call the clinic with any problems, questions or concerns. We can certainly see the patient much sooner if necessary.   Orders Placed This Encounter  Procedures  . CT CHEST W CONTRAST    Standing Status:   Future    Standing Expiration Date:   12/03/2018    Order Specific Question:   If indicated for the ordered procedure, I authorize the administration of contrast media per Radiology protocol    Answer:   Yes    Order Specific Question:   Preferred imaging location?    Answer:   [facility]    Order Specific Question:   Radiology Contrast Protocol - do NOT remove file path    Answer:   [email or web address]    Order Specific Question:   ** REASON FOR EXAM (FREE TEXT)    Answer:   Lung cancer. Restaging.     Mikey Bussing, DNP, AGPCNP-BC, AOCNP 12/02/17

## 2017-12-02 ENCOUNTER — Inpatient Hospital Stay (HOSPITAL_BASED_OUTPATIENT_CLINIC_OR_DEPARTMENT_OTHER): Payer: PPO | Admitting: Oncology

## 2017-12-02 ENCOUNTER — Encounter: Payer: Self-pay | Admitting: Oncology

## 2017-12-02 ENCOUNTER — Inpatient Hospital Stay: Payer: PPO

## 2017-12-02 ENCOUNTER — Telehealth: Payer: Self-pay | Admitting: Oncology

## 2017-12-02 VITALS — BP 120/60 | HR 83 | Temp 98.3°F | Resp 18 | Ht 59.0 in | Wt 124.0 lb

## 2017-12-02 DIAGNOSIS — E119 Type 2 diabetes mellitus without complications: Secondary | ICD-10-CM | POA: Diagnosis not present

## 2017-12-02 DIAGNOSIS — Z5112 Encounter for antineoplastic immunotherapy: Secondary | ICD-10-CM | POA: Diagnosis not present

## 2017-12-02 DIAGNOSIS — R0602 Shortness of breath: Secondary | ICD-10-CM

## 2017-12-02 DIAGNOSIS — F17210 Nicotine dependence, cigarettes, uncomplicated: Secondary | ICD-10-CM | POA: Diagnosis not present

## 2017-12-02 DIAGNOSIS — C3431 Malignant neoplasm of lower lobe, right bronchus or lung: Secondary | ICD-10-CM | POA: Diagnosis not present

## 2017-12-02 DIAGNOSIS — Z79899 Other long term (current) drug therapy: Secondary | ICD-10-CM

## 2017-12-02 DIAGNOSIS — M129 Arthropathy, unspecified: Secondary | ICD-10-CM | POA: Diagnosis not present

## 2017-12-02 DIAGNOSIS — R918 Other nonspecific abnormal finding of lung field: Secondary | ICD-10-CM

## 2017-12-02 LAB — CMP (CANCER CENTER ONLY)
ALT: 18 U/L (ref 0–44)
AST: 20 U/L (ref 15–41)
Albumin: 3.8 g/dL (ref 3.5–5.0)
Alkaline Phosphatase: 104 U/L (ref 38–126)
Anion gap: 8 (ref 5–15)
BUN: 16 mg/dL (ref 8–23)
CO2: 26 mmol/L (ref 22–32)
Calcium: 10.5 mg/dL — ABNORMAL HIGH (ref 8.9–10.3)
Chloride: 106 mmol/L (ref 98–111)
Creatinine: 0.76 mg/dL (ref 0.44–1.00)
GFR, Est AFR Am: >60 mL/min (ref >60)
GFR, Estimated: >60 mL/min (ref >60)
Glucose, Bld: 102 mg/dL — ABNORMAL HIGH (ref 70–99)
Potassium: 4.4 mmol/L (ref 3.5–5.1)
Sodium: 140 mmol/L (ref 135–145)
Total Bilirubin: 0.3 mg/dL (ref 0.3–1.2)
Total Protein: 7.2 g/dL (ref 6.5–8.1)

## 2017-12-02 LAB — CBC WITH DIFFERENTIAL (CANCER CENTER ONLY)
Basophils Absolute: 0 10*3/uL (ref 0.0–0.1)
Basophils Relative: 0 %
Eosinophils Absolute: 0.1 10*3/uL (ref 0.0–0.5)
Eosinophils Relative: 1 %
HCT: 39.7 % (ref 34.8–46.6)
Hemoglobin: 13.2 g/dL (ref 11.6–15.9)
Lymphocytes Relative: 18 %
Lymphs Abs: 1.1 10*3/uL (ref 0.9–3.3)
MCH: 31.7 pg (ref 25.1–34.0)
MCHC: 33.2 g/dL (ref 31.5–36.0)
MCV: 95.4 fL (ref 79.5–101.0)
Monocytes Absolute: 0.6 10*3/uL (ref 0.1–0.9)
Monocytes Relative: 10 %
Neutro Abs: 4.3 10*3/uL (ref 1.5–6.5)
Neutrophils Relative %: 71 %
Platelet Count: 226 10*3/uL (ref 145–400)
RBC: 4.16 MIL/uL (ref 3.70–5.45)
RDW: 12.7 % (ref 11.2–14.5)
WBC Count: 6 10*3/uL (ref 3.9–10.3)

## 2017-12-02 MED ORDER — SODIUM CHLORIDE 0.9 % IV SOLN
Freq: Once | INTRAVENOUS | Status: AC
  Administered 2017-12-02: 12:00:00 via INTRAVENOUS
  Filled 2017-12-02: qty 250

## 2017-12-02 MED ORDER — SODIUM CHLORIDE 0.9 % IV SOLN
10.5000 mg/kg | Freq: Once | INTRAVENOUS | Status: AC
  Administered 2017-12-02: 600 mg via INTRAVENOUS
  Filled 2017-12-02: qty 10

## 2017-12-02 NOTE — Telephone Encounter (Signed)
Scheduled appt per 8/22 los - pt to get updated schedule next visit.

## 2017-12-02 NOTE — Patient Instructions (Signed)
[facility] Discharge Instructions for Patients Receiving Chemotherapy  Today you received the following chemotherapy agents: Imfinzi.  To help prevent nausea and vomiting after your treatment, we encourage you to take your nausea medication as directed.   If you develop nausea and vomiting that is not controlled by your nausea medication, call the clinic.   BELOW ARE SYMPTOMS THAT SHOULD BE REPORTED IMMEDIATELY:  *FEVER GREATER THAN 100.5 F  *CHILLS WITH OR WITHOUT FEVER  NAUSEA AND VOMITING THAT IS NOT CONTROLLED WITH YOUR NAUSEA MEDICATION  *UNUSUAL SHORTNESS OF BREATH  *UNUSUAL BRUISING OR BLEEDING  TENDERNESS IN MOUTH AND THROAT WITH OR WITHOUT PRESENCE OF ULCERS  *URINARY PROBLEMS  *BOWEL PROBLEMS  UNUSUAL RASH Items with * indicate a potential emergency and should be followed up as soon as possible.  Feel free to call the clinic should you have any questions or concerns. The clinic phone number is [phone].  Please show the CHEMO ALERT CARD at check-in to the Emergency Department and triage nurse.   

## 2017-12-14 ENCOUNTER — Ambulatory Visit (HOSPITAL_COMMUNITY)
Admission: RE | Admit: 2017-12-14 | Discharge: 2017-12-14 | Disposition: A | Discharge status: Home / Self Care | Payer: PPO | Source: Ambulatory Visit | Attending: Oncology | Admitting: Oncology

## 2017-12-14 ENCOUNTER — Encounter (HOSPITAL_COMMUNITY): Payer: Self-pay

## 2017-12-14 DIAGNOSIS — I2510 Atherosclerotic heart disease of native coronary artery without angina pectoris: Secondary | ICD-10-CM | POA: Diagnosis not present

## 2017-12-14 DIAGNOSIS — C3431 Malignant neoplasm of lower lobe, right bronchus or lung: Secondary | ICD-10-CM | POA: Diagnosis not present

## 2017-12-14 DIAGNOSIS — I700 Atherosclerosis of aorta: Secondary | ICD-10-CM | POA: Diagnosis not present

## 2017-12-14 DIAGNOSIS — C3490 Malignant neoplasm of unspecified part of unspecified bronchus or lung: Secondary | ICD-10-CM | POA: Diagnosis not present

## 2017-12-14 DIAGNOSIS — J439 Emphysema, unspecified: Secondary | ICD-10-CM | POA: Insufficient documentation

## 2017-12-14 MED ORDER — IOHEXOL 300 MG/ML  SOLN
75.0000 mL | Freq: Once | INTRAMUSCULAR | Status: AC | PRN
  Administered 2017-12-14: 75 mL via INTRAVENOUS

## 2017-12-16 ENCOUNTER — Inpatient Hospital Stay: Payer: PPO

## 2017-12-16 ENCOUNTER — Inpatient Hospital Stay: Payer: PPO | Attending: Internal Medicine

## 2017-12-16 ENCOUNTER — Inpatient Hospital Stay (HOSPITAL_BASED_OUTPATIENT_CLINIC_OR_DEPARTMENT_OTHER): Payer: PPO | Admitting: Oncology

## 2017-12-16 ENCOUNTER — Other Ambulatory Visit: Payer: Self-pay | Admitting: Family Medicine

## 2017-12-16 ENCOUNTER — Encounter: Payer: Self-pay | Admitting: Oncology

## 2017-12-16 ENCOUNTER — Telehealth: Payer: Self-pay | Admitting: Oncology

## 2017-12-16 VITALS — BP 122/67 | HR 91 | Temp 98.3°F | Resp 18 | Ht 59.0 in | Wt 123.7 lb

## 2017-12-16 DIAGNOSIS — Z5112 Encounter for antineoplastic immunotherapy: Secondary | ICD-10-CM | POA: Diagnosis not present

## 2017-12-16 DIAGNOSIS — G4700 Insomnia, unspecified: Secondary | ICD-10-CM

## 2017-12-16 DIAGNOSIS — I700 Atherosclerosis of aorta: Secondary | ICD-10-CM | POA: Insufficient documentation

## 2017-12-16 DIAGNOSIS — R918 Other nonspecific abnormal finding of lung field: Secondary | ICD-10-CM | POA: Diagnosis not present

## 2017-12-16 DIAGNOSIS — M129 Arthropathy, unspecified: Secondary | ICD-10-CM | POA: Insufficient documentation

## 2017-12-16 DIAGNOSIS — Z923 Personal history of irradiation: Secondary | ICD-10-CM

## 2017-12-16 DIAGNOSIS — C3431 Malignant neoplasm of lower lobe, right bronchus or lung: Secondary | ICD-10-CM

## 2017-12-16 DIAGNOSIS — R599 Enlarged lymph nodes, unspecified: Secondary | ICD-10-CM | POA: Diagnosis not present

## 2017-12-16 DIAGNOSIS — Z79899 Other long term (current) drug therapy: Secondary | ICD-10-CM | POA: Insufficient documentation

## 2017-12-16 DIAGNOSIS — E119 Type 2 diabetes mellitus without complications: Secondary | ICD-10-CM | POA: Diagnosis not present

## 2017-12-16 DIAGNOSIS — J439 Emphysema, unspecified: Secondary | ICD-10-CM | POA: Insufficient documentation

## 2017-12-16 DIAGNOSIS — Z9221 Personal history of antineoplastic chemotherapy: Secondary | ICD-10-CM

## 2017-12-16 LAB — CBC WITH DIFFERENTIAL (CANCER CENTER ONLY)
Basophils Absolute: 0 10*3/uL (ref 0.0–0.1)
Basophils Relative: 1 %
Eosinophils Absolute: 0.1 10*3/uL (ref 0.0–0.5)
Eosinophils Relative: 2 %
HCT: 40.2 % (ref 34.8–46.6)
Hemoglobin: 13.5 g/dL (ref 11.6–15.9)
Lymphocytes Relative: 17 %
Lymphs Abs: 0.8 10*3/uL — ABNORMAL LOW (ref 0.9–3.3)
MCH: 31.8 pg (ref 25.1–34.0)
MCHC: 33.7 g/dL (ref 31.5–36.0)
MCV: 94.5 fL (ref 79.5–101.0)
Monocytes Absolute: 0.5 10*3/uL (ref 0.1–0.9)
Monocytes Relative: 10 %
Neutro Abs: 3.4 10*3/uL (ref 1.5–6.5)
Neutrophils Relative %: 70 %
Platelet Count: 207 10*3/uL (ref 145–400)
RBC: 4.25 MIL/uL (ref 3.70–5.45)
RDW: 13.2 % (ref 11.2–14.5)
WBC Count: 4.8 10*3/uL (ref 3.9–10.3)

## 2017-12-16 LAB — CMP (CANCER CENTER ONLY)
ALT: 17 U/L (ref 0–44)
AST: 21 U/L (ref 15–41)
Albumin: 3.8 g/dL (ref 3.5–5.0)
Alkaline Phosphatase: 108 U/L (ref 38–126)
Anion gap: 9 (ref 5–15)
BUN: 17 mg/dL (ref 8–23)
CO2: 24 mmol/L (ref 22–32)
Calcium: 10.4 mg/dL — ABNORMAL HIGH (ref 8.9–10.3)
Chloride: 108 mmol/L (ref 98–111)
Creatinine: 0.8 mg/dL (ref 0.44–1.00)
GFR, Est AFR Am: >60 mL/min (ref >60)
GFR, Estimated: >60 mL/min (ref >60)
Glucose, Bld: 97 mg/dL (ref 70–99)
Potassium: 4.4 mmol/L (ref 3.5–5.1)
Sodium: 141 mmol/L (ref 135–145)
Total Bilirubin: 0.5 mg/dL (ref 0.3–1.2)
Total Protein: 7.4 g/dL (ref 6.5–8.1)

## 2017-12-16 LAB — TSH: TSH: 2.174 u[IU]/mL (ref 0.308–3.960)

## 2017-12-16 MED ORDER — SODIUM CHLORIDE 0.9 % IV SOLN
10.5000 mg/kg | Freq: Once | INTRAVENOUS | Status: AC
  Administered 2017-12-16: 600 mg via INTRAVENOUS
  Filled 2017-12-16: qty 10

## 2017-12-16 MED ORDER — SODIUM CHLORIDE 0.9 % IV SOLN
Freq: Once | INTRAVENOUS | Status: AC
  Administered 2017-12-16: 11:00:00 via INTRAVENOUS
  Filled 2017-12-16: qty 250

## 2017-12-16 NOTE — Progress Notes (Signed)
[facility] OFFICE PROGRESS NOTE  Katelyn Sanders, MD [place] [address]  DIAGNOSIS:Stage IIIA (T2a,N2, M0) non-small cell lung cancer,adenocarcinomapresenting with right lower lobe lung mass in addition to subcarinal lymphadenopathy and suspicious metastatic pulmonary nodules diagnosed in February 2019.  PRIOR THERAPY: Concurrent chemoradiation with carboplatin for an AUC of 2 with paclitaxel 45 mg/m weekly. First dosegiven on3/18/2019. Status post 7 cycles. Last dose was giving August 09, 2017 with partial response.  CURRENT THERAPY: Consolidation immunotherapy with Imfinzi (Durvalumab) 10 mg/KG every 2 weeks, first dose 09/23/2017. Status post 6cycles.  INTERVAL HISTORY: Katelyn Kennedy 72 y.o. female returns for routine follow-up visit by herself.  The patient is feeling fine today and has no specific complaints except for difficulty sleeping.  She was previously on Restoril which was effective.  She tried Benadryl which was not helpful  She reports that she sleeps 4 to 6 hours per night.  She does nap during the day for only about 15 to 20 minutes of the most.  She denies fevers and chills.  Denies chest pain, shortness of breath, cough, hemoptysis.  Denies nausea, vomiting, constipation, diarrhea.  Denies recent weight loss or night sweats.  The patient continues to tolerate treatment with Imfinzi fairly well.  The patient is here for evaluation prior to cycle #7 of her treatment and to review her restaging CT scan of the chest.  MEDICAL HISTORY: Past Medical History:  Diagnosis Date  . Allergic rhinitis   . Arthritis   . Diabetes mellitus without complication (HCC)    diet control/no meds per pt  . Dyspnea   . History of chicken pox   . Smoker     ALLERGIES:  is allergic to codeine.  MEDICATIONS:  Current Outpatient Medications  Medication Sig Dispense Refill  . atorvastatin (LIPITOR) 20 MG tablet TAKE 1 TABLET BY MOUTH EVERY  DAY 90 tablet 0  . lisinopril (PRINIVIL,ZESTRIL) 5 MG tablet Take 0.5 tablets (2.5 mg total) by mouth daily. 45 tablet 3  . Multiple Vitamin (MULTIVITAMIN) capsule Take 1 capsule by mouth daily.      Marland Kitchen acetaminophen (TYLENOL) 500 MG tablet Take 500 mg by mouth daily as needed for moderate pain or headache.    . Calcium Carbonate-Vitamin D (CALCIUM + D) 600-200 MG-UNIT per tablet Take 1 tablet by mouth daily.     . diphenhydramine-acetaminophen (TYLENOL PM) 25-500 MG TABS tablet Take 2 tablets by mouth at bedtime as needed.    . fluticasone (FLONASE) 50 MCG/ACT nasal spray PLACE 2 SPRAYS INTO THE NOSE DAILY. (Patient not taking: Reported on 12/16/2017) 16 g 5  . prochlorperazine (COMPAZINE) 10 MG tablet Take 1 tablet (10 mg total) by mouth every 6 (six) hours as needed for nausea or vomiting. (Patient not taking: Reported on 12/16/2017) 30 tablet 0  . sucralfate (CARAFATE) 1 g tablet Take 1 tablet (1 g total) by mouth 4 (four) times daily. (Patient not taking: Reported on 12/16/2017) 120 tablet 2  . temazepam (RESTORIL) 15 MG capsule Take 1 capsule (15 mg total) by mouth at bedtime as needed for sleep. (Patient not taking: Reported on 12/16/2017) 30 capsule 0  . Tetrahydrozoline HCl (VISINE OP) Place 1 drop into both eyes daily as needed (for dry eyes).     . traMADol (ULTRAM) 50 MG tablet Take 1 tablet (50 mg total) by mouth every 6 (six) hours as needed for moderate pain. (Patient not taking: Reported on 12/16/2017) 15 tablet 0  No current facility-administered medications for this visit.    Facility-Administered Medications Ordered in Other Visits  Medication Dose Route Frequency Provider Last Rate Last Dose  . durvalumab (IMFINZI) 600 mg in sodium chloride 0.9 % 100 mL chemo infusion  10.5 mg/kg (Treatment Plan Recorded) Intravenous Once Curt Bears, MD        SURGICAL HISTORY:  Past Surgical History:  Procedure Laterality Date  . ANKLE FRACTURE SURGERY Left 10 years ago   "hard to wake up  from anesthesia" per pt  . [place]  . BREAST BIOPSY Right 1978   BENIGN CYST  . BREAST EXCISIONAL BIOPSY Left 2011   Benign  . BREAST EXCISIONAL BIOPSY Right 1985   Benign   . CATARACT EXTRACTION    . CATARACT EXTRACTION W/ INTRAOCULAR LENS IMPLANT Bilateral   . COLONOSCOPY  2011  . ELECTROMAGNETIC NAVIGATION BROCHOSCOPY N/A 06/08/2017   Procedure: ELECTROMAGNETIC NAVIGATION BRONCHOSCOPY;  Surgeon: Flora Lipps, MD;  Location: [facility] ORS;  Service: Cardiopulmonary;  Laterality: N/A;  . POLYPECTOMY  2011  . 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 TUBAL LIGATION      REVIEW OF SYSTEMS:   Review of Systems  Constitutional: Negative for appetite change, chills, fatigue, fever and unexpected weight change.  HENT:   Negative for mouth sores, nosebleeds, sore throat and trouble swallowing.   Eyes: Negative for eye problems and icterus.  Respiratory: Negative for cough, hemoptysis, shortness of breath and wheezing.   Cardiovascular: Negative for chest pain and leg swelling.  Gastrointestinal: Negative for abdominal pain, constipation, diarrhea, nausea and vomiting.  Genitourinary: Negative for bladder incontinence, difficulty urinating, dysuria, frequency and hematuria.   Musculoskeletal: Negative for back pain, gait problem, neck pain and neck stiffness.  Skin: Negative for itching and rash.  Neurological: Negative for dizziness, extremity weakness, gait problem, headaches, light-headedness and seizures.  Hematological: Negative for adenopathy. Does not bruise/bleed easily.  Psychiatric/Behavioral: Negative for confusion and depression. The patient is not nervous/anxious. Positive for insomnia.    PHYSICAL EXAMINATION:  Blood pressure 122/67, pulse 91, temperature 98.3 F  (36.8 C), temperature source Oral, resp. rate 18, height 4\' 11"  (1.499 m), weight 123 lb 11.2 oz (56.1 kg), SpO2 95 %.  ECOG PERFORMANCE STATUS: 1 - Symptomatic but completely ambulatory  Physical Exam  Constitutional: Oriented to person, place, and time and well-developed, well-nourished, and in no distress. No distress.  HENT:  Head: Normocephalic and atraumatic.  Mouth/Throat: Oropharynx is clear and moist. No oropharyngeal exudate.  Eyes: Conjunctivae are normal. Right eye exhibits no discharge. Left eye exhibits no discharge. No scleral icterus.  Neck: Normal range of motion. Neck supple.  Cardiovascular: Normal rate, regular rhythm, normal heart sounds and intact distal pulses.   Pulmonary/Chest: Effort normal and breath sounds normal. No respiratory distress. No wheezes. No rales.  Abdominal: Soft. Bowel sounds are normal. Exhibits no distension and no mass. There is no tenderness.  Musculoskeletal: Normal range of motion. Exhibits no edema.  Lymphadenopathy:    No cervical adenopathy.  Neurological: Alert and oriented to person, place, and time. Exhibits normal muscle tone. Gait normal. Coordination normal.  Skin: Skin is warm and dry. No rash noted. Not diaphoretic. No erythema. No pallor.  Psychiatric: Mood, memory and judgment normal.  Vitals reviewed.  LABORATORY DATA: Lab Results  Component Value Date  WBC 4.8 12/16/2017   HGB 13.5 12/16/2017   HCT 40.2 12/16/2017   MCV 94.5 12/16/2017   PLT 207 12/16/2017      Chemistry      Component Value Date/Time   NA 141 12/16/2017 0945   K 4.4 12/16/2017 0945   CL 108 12/16/2017 0945   CO2 24 12/16/2017 0945   BUN 17 12/16/2017 0945   CREATININE 0.80 12/16/2017 0945      Component Value Date/Time   CALCIUM 10.4 (H) 12/16/2017 0945   ALKPHOS 108 12/16/2017 0945   AST 21 12/16/2017 0945   ALT 17 12/16/2017 0945   BILITOT 0.5 12/16/2017 0945       RADIOGRAPHIC STUDIES:  Ct Chest W Contrast  Result Date:  12/14/2017 CLINICAL DATA:  Followup lung cancer. EXAM: CT CHEST WITH CONTRAST TECHNIQUE: Multidetector CT imaging of the chest was performed during intravenous contrast administration. CONTRAST:  65mL OMNIPAQUE IOHEXOL 300 MG/ML  SOLN COMPARISON:  09/01/2017 FINDINGS: Cardiovascular: The heart size appears within normal limits. Aortic atherosclerosis. Calcification in the left main coronary artery, LAD and RCA noted. Mediastinum/Nodes: Low-density thyroid nodules are again noted measuring up to 1.4 cm, image 16/2. The trachea appears patent and is midline. Normal appearance of the esophagus. No supraclavicular, axillary, mediastinal or hilar adenopathy identified. Lungs/Pleura: No pleural effusion. Mild changes of emphysema. Areas of fibrosis and ground-glass attenuation with airspace consolidation in the right lung noted compatible with radiation change. Previous right lower lobe lung lesion is no longer visualized but may be obscured by changes of external beam radiation. Solid nodule within the posterior right lower lobe is again noted measuring 0.7 cm, image 107/7. Stable from previous exam. Tiny solid nodule in the medial right apex is unchanged measuring 3 mm, image 21/7. Upper Abdomen: Low attenuation structure within lateral segment of left lobe of liver is unchanged measuring 11 mm, image 120/2. Additional small low-density structure within the right lobe of liver is unchanged. Musculoskeletal: Spondylosis identified within the thoracic spine. No aggressive lytic or sclerotic bone lesions identified. IMPRESSION: 1. Interval development of changes secondary to external beam radiation within the right lung. 2. Stable 7 mm posterior right lower lobe lung nodule. The previously noted 1.2 cm right lower lobe lung nodule is not seen on today's exam, but may be obscured by changes due to external beam radiation. 3. Aortic Atherosclerosis (ICD10-I70.0) and Emphysema (ICD10-J43.9). 4. Multi vessel coronary artery  atherosclerotic calcifications. Electronically Signed   By: Kerby Moors M.D.   On: 12/14/2017 15:47     ASSESSMENT/PLAN:  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 [facility]) This is a very pleasant 72 year old white female with suspicious for stage IIIA non-small cell lung cancer, adenocarcinoma presented mainly with right lower lobe lung mass in addition to subcarinal lymphadenopathy but there was suspicious metastatic pulmonary nodules. The patient completed a course of concurrent chemoradiation with weekly carboplatin and paclitaxel. She tolerated her treatment well with no concerning complaints. The patient is currently undergoing consolidation treatment with immunotherapy with Imfinzi (Durvalumab) status post6cycles. The patient is tolerating this treatment well with no concerning complaints. She had a restaging CT scan of the chest and is here to discuss the results.  The patient was seen with Dr. Julien Nordmann.  CT scan of the chest was discussed with the patient which did not show any evidence of disease progression.  Recommend that she continue her treatment with Imfinzi.  She will proceed with cycle #7 today as scheduled.  The patient  will follow-up in 2 weeks for evaluation prior to cycle #8.  For insomnia, recommend that she try melatonin over-the-counter.  She was advised to call immediately if she has any concerning symptoms in the interval. All questions were answered. The patient knows to call the clinic with any problems, questions or concerns. We can certainly see the patient much sooner if necessary.   No orders of the defined types were placed in this encounter.    Mikey Bussing, DNP, AGPCNP-BC, AOCNP 12/16/17   ADDENDUM: Hematology/Oncology Attending: I had a face-to-face encounter with the patient.  I recommended her care plan.  This is a very pleasant 72 years old white female with a stage IIIa non-small cell lung cancer, adenocarcinoma status post a  course of concurrent chemoradiation with weekly carboplatin and paclitaxel with partial response.  The patient is currently undergoing consolidation treatment with immunotherapy with Imfinzi (Durvalumab) status post 6 cycles.  The patient has been tolerating this treatment fairly well. She had a repeat CT scan of the chest performed recently.  I personally and independently reviewed the scans and discussed the results with the patient today.  Her scan showed no concerning findings for disease progression. I recommended for the patient to continue her current treatment with Imfinzi (Durvalumab) and she will proceed with cycle #7 today. She will come back for follow-up visit in 2 weeks for evaluation before starting cycle #8. She was advised to call immediately if she has any concerning symptoms in the interval.  Disclaimer: This note was dictated with voice recognition software. Similar sounding words can inadvertently be transcribed and may be missed upon review. Eilleen Kempf, MD 12/18/17

## 2017-12-16 NOTE — Patient Instructions (Signed)
[facility] Discharge Instructions for Patients Receiving Chemotherapy  Today you received the following chemotherapy agents: Imfinzi.  To help prevent nausea and vomiting after your treatment, we encourage you to take your nausea medication as directed.   If you develop nausea and vomiting that is not controlled by your nausea medication, call the clinic.   BELOW ARE SYMPTOMS THAT SHOULD BE REPORTED IMMEDIATELY:  *FEVER GREATER THAN 100.5 F  *CHILLS WITH OR WITHOUT FEVER  NAUSEA AND VOMITING THAT IS NOT CONTROLLED WITH YOUR NAUSEA MEDICATION  *UNUSUAL SHORTNESS OF BREATH  *UNUSUAL BRUISING OR BLEEDING  TENDERNESS IN MOUTH AND THROAT WITH OR WITHOUT PRESENCE OF ULCERS  *URINARY PROBLEMS  *BOWEL PROBLEMS  UNUSUAL RASH Items with * indicate a potential emergency and should be followed up as soon as possible.  Feel free to call the clinic should you have any questions or concerns. The clinic phone number is [phone].  Please show the CHEMO ALERT CARD at check-in to the Emergency Department and triage nurse.   

## 2017-12-16 NOTE — Assessment & Plan Note (Addendum)
This is a very pleasant 72 year old white female with suspicious for stage IIIA non-small cell lung cancer, adenocarcinoma presented mainly with right lower lobe lung mass in addition to subcarinal lymphadenopathy but there was suspicious metastatic pulmonary nodules. The patient completed a course of concurrent chemoradiation with weekly carboplatin and paclitaxel. She tolerated her treatment well with no concerning complaints. The patient is currently undergoing consolidation treatment with immunotherapy with Imfinzi (Durvalumab) status post6cycles. The patient is tolerating this treatment well with no concerning complaints. She had a restaging CT scan of the chest and is here to discuss the results.  The patient was seen with Dr. Julien Nordmann.  CT scan of the chest was discussed with the patient which did not show any evidence of disease progression.  Recommend that she continue her treatment with Imfinzi.  She will proceed with cycle #7 today as scheduled.  The patient will follow-up in 2 weeks for evaluation prior to cycle #8.  For insomnia, recommend that she try melatonin over-the-counter.  She was advised to call immediately if she has any concerning symptoms in the interval. All questions were answered. The patient knows to call the clinic with any problems, questions or concerns. We can certainly see the patient much sooner if necessary.

## 2017-12-16 NOTE — Telephone Encounter (Signed)
3 cycles already scheduled per 9/5 los.

## 2017-12-29 ENCOUNTER — Telehealth: Payer: Self-pay | Admitting: *Deleted

## 2017-12-29 NOTE — Telephone Encounter (Signed)
Copied from [place] [phone]. Topic: General - Other >> Dec 29, 2017 11:00 AM Carolyn Stare wrote:  Pt call to ask for something for sleep she said she had this conversation with Dr Diona Browner at her last visit . Her infusion is keeping her woke    [place]

## 2017-12-30 ENCOUNTER — Encounter: Payer: Self-pay | Admitting: Internal Medicine

## 2017-12-30 ENCOUNTER — Inpatient Hospital Stay (HOSPITAL_BASED_OUTPATIENT_CLINIC_OR_DEPARTMENT_OTHER): Payer: PPO | Admitting: Internal Medicine

## 2017-12-30 ENCOUNTER — Telehealth: Payer: Self-pay | Admitting: Internal Medicine

## 2017-12-30 ENCOUNTER — Inpatient Hospital Stay: Payer: PPO

## 2017-12-30 VITALS — BP 117/65 | HR 88 | Temp 98.3°F | Resp 18 | Ht 59.0 in | Wt 125.0 lb

## 2017-12-30 DIAGNOSIS — C3431 Malignant neoplasm of lower lobe, right bronchus or lung: Secondary | ICD-10-CM

## 2017-12-30 DIAGNOSIS — R918 Other nonspecific abnormal finding of lung field: Secondary | ICD-10-CM

## 2017-12-30 DIAGNOSIS — M129 Arthropathy, unspecified: Secondary | ICD-10-CM | POA: Diagnosis not present

## 2017-12-30 DIAGNOSIS — Z9221 Personal history of antineoplastic chemotherapy: Secondary | ICD-10-CM

## 2017-12-30 DIAGNOSIS — R599 Enlarged lymph nodes, unspecified: Secondary | ICD-10-CM | POA: Diagnosis not present

## 2017-12-30 DIAGNOSIS — I700 Atherosclerosis of aorta: Secondary | ICD-10-CM

## 2017-12-30 DIAGNOSIS — Z923 Personal history of irradiation: Secondary | ICD-10-CM | POA: Diagnosis not present

## 2017-12-30 DIAGNOSIS — E119 Type 2 diabetes mellitus without complications: Secondary | ICD-10-CM | POA: Diagnosis not present

## 2017-12-30 DIAGNOSIS — J439 Emphysema, unspecified: Secondary | ICD-10-CM | POA: Diagnosis not present

## 2017-12-30 DIAGNOSIS — G4700 Insomnia, unspecified: Secondary | ICD-10-CM | POA: Diagnosis not present

## 2017-12-30 DIAGNOSIS — Z79899 Other long term (current) drug therapy: Secondary | ICD-10-CM | POA: Diagnosis not present

## 2017-12-30 DIAGNOSIS — Z5112 Encounter for antineoplastic immunotherapy: Secondary | ICD-10-CM | POA: Diagnosis not present

## 2017-12-30 DIAGNOSIS — D494 Neoplasm of unspecified behavior of bladder: Secondary | ICD-10-CM

## 2017-12-30 LAB — CMP (CANCER CENTER ONLY)
ALT: 18 U/L (ref 0–44)
AST: 20 U/L (ref 15–41)
Albumin: 3.9 g/dL (ref 3.5–5.0)
Alkaline Phosphatase: 107 U/L (ref 38–126)
Anion gap: 12 (ref 5–15)
BUN: 16 mg/dL (ref 8–23)
CO2: 23 mmol/L (ref 22–32)
Calcium: 10.5 mg/dL — ABNORMAL HIGH (ref 8.9–10.3)
Chloride: 106 mmol/L (ref 98–111)
Creatinine: 0.8 mg/dL (ref 0.44–1.00)
GFR, Est AFR Am: >60 mL/min (ref >60)
GFR, Estimated: >60 mL/min (ref >60)
Glucose, Bld: 95 mg/dL (ref 70–99)
Potassium: 4.2 mmol/L (ref 3.5–5.1)
Sodium: 141 mmol/L (ref 135–145)
Total Bilirubin: 0.4 mg/dL (ref 0.3–1.2)
Total Protein: 7.5 g/dL (ref 6.5–8.1)

## 2017-12-30 LAB — CBC WITH DIFFERENTIAL (CANCER CENTER ONLY)
Basophils Absolute: 0 10*3/uL (ref 0.0–0.1)
Basophils Relative: 1 %
Eosinophils Absolute: 0.1 10*3/uL (ref 0.0–0.5)
Eosinophils Relative: 1 %
HCT: 40.3 % (ref 34.8–46.6)
Hemoglobin: 13.6 g/dL (ref 11.6–15.9)
Lymphocytes Relative: 15 %
Lymphs Abs: 1 10*3/uL (ref 0.9–3.3)
MCH: 31.7 pg (ref 25.1–34.0)
MCHC: 33.7 g/dL (ref 31.5–36.0)
MCV: 94 fL (ref 79.5–101.0)
Monocytes Absolute: 0.6 10*3/uL (ref 0.1–0.9)
Monocytes Relative: 9 %
Neutro Abs: 5 10*3/uL (ref 1.5–6.5)
Neutrophils Relative %: 74 %
Platelet Count: 232 10*3/uL (ref 145–400)
RBC: 4.29 MIL/uL (ref 3.70–5.45)
RDW: 13.6 % (ref 11.2–14.5)
WBC Count: 6.7 10*3/uL (ref 3.9–10.3)

## 2017-12-30 MED ORDER — SODIUM CHLORIDE 0.9 % IV SOLN
10.9000 mg/kg | Freq: Once | INTRAVENOUS | Status: AC
  Administered 2017-12-30: 620 mg via INTRAVENOUS
  Filled 2017-12-30: qty 10

## 2017-12-30 MED ORDER — SODIUM CHLORIDE 0.9 % IV SOLN
Freq: Once | INTRAVENOUS | Status: AC
  Administered 2017-12-30: 12:00:00 via INTRAVENOUS
  Filled 2017-12-30: qty 250

## 2017-12-30 NOTE — Patient Instructions (Signed)
[facility] Discharge Instructions for Patients Receiving Chemotherapy  Today you received the following chemotherapy agents Durvalumab (IMFINZI).  To help prevent nausea and vomiting after your treatment, we encourage you to take your nausea medication as prescribed.   If you develop nausea and vomiting that is not controlled by your nausea medication, call the clinic.   BELOW ARE SYMPTOMS THAT SHOULD BE REPORTED IMMEDIATELY:  *FEVER GREATER THAN 100.5 F  *CHILLS WITH OR WITHOUT FEVER  NAUSEA AND VOMITING THAT IS NOT CONTROLLED WITH YOUR NAUSEA MEDICATION  *UNUSUAL SHORTNESS OF BREATH  *UNUSUAL BRUISING OR BLEEDING  TENDERNESS IN MOUTH AND THROAT WITH OR WITHOUT PRESENCE OF ULCERS  *URINARY PROBLEMS  *BOWEL PROBLEMS  UNUSUAL RASH Items with * indicate a potential emergency and should be followed up as soon as possible.  Feel free to call the clinic should you have any questions or concerns. The clinic phone number is [phone].  Please show the [place] at check-in to the Emergency Department and triage nurse.

## 2017-12-30 NOTE — Progress Notes (Signed)
[place] [phone]   [phone]  OFFICE PROGRESS NOTE  [name], MD [place] [address]  DIAGNOSIS: Stage IIIA (T2a,N2, M0) non-small cell lung cancer,adenocarcinomapresenting with right lower lobe lung mass in addition to subcarinal lymphadenopathy and suspicious metastatic pulmonary nodules diagnosed in February 2019.  PRIOR THERAPY: Concurrent chemoradiation with carboplatin for an AUC of 2 with paclitaxel 45 mg/m weekly. First dose given on 06/28/2017.  Status post 7 cycles.  Last dose was giving August 09, 2017 with partial response.  CURRENT THERAPY: Consolidation immunotherapy with Imfinzi (Durvalumab) 10 mg/KG every 2 weeks, first dose 09/23/2017.  Status post 7 cycles.  INTERVAL HISTORY: Katelyn Kennedy 72 y.o. female returns to the clinic today for follow-up visit.  The patient is doing well today with no concerning complaints.  She is tolerating her consolidation treatment with Imfinzi (Durvalumab) fairly well.  She denied having any chest pain, shortness of breath, cough or hemoptysis.  She denied having any fever or chills.  She has no nausea, vomiting, diarrhea or constipation.  She has no headache or visual changes.  She is here today for evaluation before starting cycle #8.  MEDICAL HISTORY: Past Medical History:  Diagnosis Date  . Allergic rhinitis   . Arthritis   . Diabetes mellitus without complication (HCC)    diet control/no meds per pt  . Dyspnea   . History of chicken pox   . Smoker     ALLERGIES:  is allergic to codeine.  MEDICATIONS:  Current Outpatient Medications  Medication Sig Dispense Refill  . acetaminophen (TYLENOL) 500 MG tablet Take 500 mg by mouth daily as needed for moderate pain or headache.    Marland Kitchen atorvastatin (LIPITOR) 20 MG tablet TAKE 1 TABLET BY MOUTH EVERY DAY 90 tablet 0  . fluticasone (FLONASE) 50 MCG/ACT nasal spray PLACE 2 SPRAYS INTO THE NOSE DAILY. 16 g 5    . lisinopril (PRINIVIL,ZESTRIL) 5 MG tablet Take 0.5 tablets (2.5 mg total) by mouth daily. 45 tablet 3  . Multiple Vitamin (MULTIVITAMIN) capsule Take 1 capsule by mouth daily.      . Tetrahydrozoline HCl (VISINE OP) Place 1 drop into both eyes daily as needed (for dry eyes).     . traMADol (ULTRAM) 50 MG tablet Take 1 tablet (50 mg total) by mouth every 6 (six) hours as needed for moderate pain. 15 tablet 0  . Calcium Carbonate-Vitamin D (CALCIUM + D) 600-200 MG-UNIT per tablet Take 1 tablet by mouth daily.     . diphenhydramine-acetaminophen (TYLENOL PM) 25-500 MG TABS tablet Take 2 tablets by mouth at bedtime as needed.    . prochlorperazine (COMPAZINE) 10 MG tablet Take 1 tablet (10 mg total) by mouth every 6 (six) hours as needed for nausea or vomiting. (Patient not taking: Reported on 12/16/2017) 30 tablet 0  . sucralfate (CARAFATE) 1 g tablet Take 1 tablet (1 g total) by mouth 4 (four) times daily. (Patient not taking: Reported on 12/16/2017) 120 tablet 2   No current facility-administered medications for this visit.     SURGICAL HISTORY:  Past Surgical History:  Procedure Laterality Date  . ANKLE FRACTURE SURGERY Left 10 years ago   "hard to wake up from anesthesia" per pt  . [place]  . BREAST BIOPSY Right 1978   BENIGN CYST  . BREAST EXCISIONAL BIOPSY Left 2011   Benign  . BREAST EXCISIONAL BIOPSY Right 1985  Benign   . CATARACT EXTRACTION    . CATARACT EXTRACTION W/ INTRAOCULAR LENS IMPLANT Bilateral   . COLONOSCOPY  2011  . ELECTROMAGNETIC NAVIGATION BROCHOSCOPY N/A 06/08/2017   Procedure: ELECTROMAGNETIC NAVIGATION BRONCHOSCOPY;  Surgeon: Flora Lipps, MD;  Location: [facility] ORS;  Service: Cardiopulmonary;  Laterality: N/A;  . POLYPECTOMY  2011  . TRANSURETHRAL RESECTION OF BLADDER TUMOR WITH MITOMYCIN-C N/A 05/04/2017   Procedure: TRANSURETHRAL RESECTION OF BLADDER TUMOR WITH gemcitabine;  Surgeon: Abbie Sons, MD;  Location: [facility] ORS;  Service:  Urology;  Laterality: N/A;  . TUBAL LIGATION      REVIEW OF SYSTEMS:  A comprehensive review of systems was negative.   PHYSICAL EXAMINATION: General appearance: alert, cooperative and no distress Head: Normocephalic, without obvious abnormality, atraumatic Neck: no adenopathy, no JVD, supple, symmetrical, trachea midline and thyroid not enlarged, symmetric, no tenderness/mass/nodules Lymph nodes: Cervical, supraclavicular, and axillary nodes normal. Resp: clear to auscultation bilaterally Back: symmetric, no curvature. ROM normal. No CVA tenderness. Cardio: regular rate and rhythm, S1, S2 normal, no murmur, click, rub or gallop GI: soft, non-tender; bowel sounds normal; no masses,  no organomegaly Extremities: extremities normal, atraumatic, no cyanosis or edema  ECOG PERFORMANCE STATUS: 0 - Asymptomatic  Blood pressure 117/65, pulse 88, temperature 98.3 F (36.8 C), temperature source Oral, resp. rate 18, height 4\' 11"  (1.499 m), weight 125 lb (56.7 kg), SpO2 97 %.  LABORATORY DATA: Lab Results  Component Value Date   WBC 6.7 12/30/2017   HGB 13.6 12/30/2017   HCT 40.3 12/30/2017   MCV 94.0 12/30/2017   PLT 232 12/30/2017      Chemistry      Component Value Date/Time   NA 141 12/30/2017 0935   K 4.2 12/30/2017 0935   CL 106 12/30/2017 0935   CO2 23 12/30/2017 0935   BUN 16 12/30/2017 0935   CREATININE 0.80 12/30/2017 0935      Component Value Date/Time   CALCIUM 10.5 (H) 12/30/2017 0935   ALKPHOS 107 12/30/2017 0935   AST 20 12/30/2017 0935   ALT 18 12/30/2017 0935   BILITOT 0.4 12/30/2017 0935       RADIOGRAPHIC STUDIES: Ct Chest W Contrast  Result Date: 12/14/2017 CLINICAL DATA:  Followup lung cancer. EXAM: CT CHEST WITH CONTRAST TECHNIQUE: Multidetector CT imaging of the chest was performed during intravenous contrast administration. CONTRAST:  42mL OMNIPAQUE IOHEXOL 300 MG/ML  SOLN COMPARISON:  09/01/2017 FINDINGS: Cardiovascular: The heart size appears  within normal limits. Aortic atherosclerosis. Calcification in the left main coronary artery, LAD and RCA noted. Mediastinum/Nodes: Low-density thyroid nodules are again noted measuring up to 1.4 cm, image 16/2. The trachea appears patent and is midline. Normal appearance of the esophagus. No supraclavicular, axillary, mediastinal or hilar adenopathy identified. Lungs/Pleura: No pleural effusion. Mild changes of emphysema. Areas of fibrosis and ground-glass attenuation with airspace consolidation in the right lung noted compatible with radiation change. Previous right lower lobe lung lesion is no longer visualized but may be obscured by changes of external beam radiation. Solid nodule within the posterior right lower lobe is again noted measuring 0.7 cm, image 107/7. Stable from previous exam. Tiny solid nodule in the medial right apex is unchanged measuring 3 mm, image 21/7. Upper Abdomen: Low attenuation structure within lateral segment of left lobe of liver is unchanged measuring 11 mm, image 120/2. Additional small low-density structure within the right lobe of liver is unchanged. Musculoskeletal: Spondylosis identified within the thoracic spine. No aggressive lytic or sclerotic bone lesions identified. IMPRESSION:  1. Interval development of changes secondary to external beam radiation within the right lung. 2. Stable 7 mm posterior right lower lobe lung nodule. The previously noted 1.2 cm right lower lobe lung nodule is not seen on today's exam, but may be obscured by changes due to external beam radiation. 3. Aortic Atherosclerosis (ICD10-I70.0) and Emphysema (ICD10-J43.9). 4. Multi vessel coronary artery atherosclerotic calcifications. Electronically Signed   By: Kerby Moors M.D.   On: 12/14/2017 15:47    ASSESSMENT AND PLAN: This is a very pleasant 72 years old white female with suspicious for stage IIIA non-small cell lung cancer, adenocarcinoma presented mainly with right lower lobe lung mass in  addition to subcarinal lymphadenopathy but there was suspicious metastatic pulmonary nodules. The patient completed a course of concurrent chemoradiation with weekly carboplatin and paclitaxel.  She tolerated her treatment well with no concerning complaints. The patient is currently undergoing consolidation treatment with immunotherapy with Imfinzi (Durvalumab) status post 7 cycles. She continues to tolerate this treatment well with no concerning adverse effects. I recommended for her to proceed with cycle #8 today as scheduled. I will see her back for follow-up visit in 2 weeks for evaluation before starting cycle #9. She was advised to call immediately if she has any concerning symptoms in the interval. All questions were answered. The patient knows to call the clinic with any problems, questions or concerns. We can certainly see the patient much sooner if necessary.   Disclaimer: This note was dictated with voice recognition software. Similar sounding words can inadvertently be transcribed and may not be corrected upon review.

## 2017-12-30 NOTE — Telephone Encounter (Signed)
Left message for Katelyn Kennedy to return call about sleep medication.  When patient calls back, okay for [place] Nurse to ask the questions [name] is needing to know.

## 2017-12-30 NOTE — Telephone Encounter (Signed)
3 cycles already scheduled per 9/19 los - no additional appts added.

## 2017-12-30 NOTE — Telephone Encounter (Signed)
Pt returning call stating that she has tried Tylenol PM but it did not really help and seemed to do the opposite and make her more hyper. Pt states she did try Temazepam in the past but her oncologist recommended that she did not take the medication any longer because it was habit forming. Pt states she has never tried Trazodone. Pt states that is was recommended that she try to take Melatonin 5 mg, which was recommended by her oncologist. Pt states that when she tried to take Melatonin the first time it did not really work. Pt states when she tried it the 2nd time it did help but she believes that it was because she was so tired. Pt wanting to know what the recommended dosage of Melatonin would be and what medication she could take for sleep that would be safe for her and not habit forming.

## 2017-12-30 NOTE — Telephone Encounter (Signed)
Melatonin 5 mg at bedtime is correct dose.  If this does not help.. I will send in trazodone 50 mg tabs to try 1/2 to 1 tab at bedtime... Any sleep med including tylneol PM can be habit forming but trazodone is safer > age 74 and less habit forming.  Have her let me know after trying melatonin 5 mg x 1 week

## 2017-12-30 NOTE — Telephone Encounter (Signed)
I see they have tried restoril but she has just taken tylenol PM instead... neither helping? Has she ever tried trazodone?

## 2017-12-31 NOTE — Telephone Encounter (Signed)
Ms. Humes notified as instructed by telephone.  Patient states understanding.

## 2018-01-11 DIAGNOSIS — H524 Presbyopia: Secondary | ICD-10-CM | POA: Diagnosis not present

## 2018-01-11 DIAGNOSIS — H52223 Regular astigmatism, bilateral: Secondary | ICD-10-CM | POA: Diagnosis not present

## 2018-01-11 DIAGNOSIS — H5203 Hypermetropia, bilateral: Secondary | ICD-10-CM | POA: Diagnosis not present

## 2018-01-11 DIAGNOSIS — H35371 Puckering of macula, right eye: Secondary | ICD-10-CM | POA: Diagnosis not present

## 2018-01-11 LAB — HM DIABETES EYE EXAM

## 2018-01-13 ENCOUNTER — Encounter: Payer: Self-pay | Admitting: Nurse Practitioner

## 2018-01-13 ENCOUNTER — Inpatient Hospital Stay (HOSPITAL_BASED_OUTPATIENT_CLINIC_OR_DEPARTMENT_OTHER): Payer: PPO | Admitting: Nurse Practitioner

## 2018-01-13 ENCOUNTER — Inpatient Hospital Stay: Payer: PPO | Attending: Internal Medicine

## 2018-01-13 ENCOUNTER — Inpatient Hospital Stay: Payer: PPO

## 2018-01-13 VITALS — BP 123/63 | HR 99 | Temp 99.1°F | Resp 18 | Ht 59.0 in | Wt 124.8 lb

## 2018-01-13 DIAGNOSIS — Z79899 Other long term (current) drug therapy: Secondary | ICD-10-CM | POA: Insufficient documentation

## 2018-01-13 DIAGNOSIS — E119 Type 2 diabetes mellitus without complications: Secondary | ICD-10-CM | POA: Insufficient documentation

## 2018-01-13 DIAGNOSIS — Z9221 Personal history of antineoplastic chemotherapy: Secondary | ICD-10-CM | POA: Insufficient documentation

## 2018-01-13 DIAGNOSIS — C3431 Malignant neoplasm of lower lobe, right bronchus or lung: Secondary | ICD-10-CM | POA: Insufficient documentation

## 2018-01-13 DIAGNOSIS — M129 Arthropathy, unspecified: Secondary | ICD-10-CM | POA: Insufficient documentation

## 2018-01-13 DIAGNOSIS — Z5112 Encounter for antineoplastic immunotherapy: Secondary | ICD-10-CM | POA: Insufficient documentation

## 2018-01-13 DIAGNOSIS — Z923 Personal history of irradiation: Secondary | ICD-10-CM | POA: Diagnosis not present

## 2018-01-13 DIAGNOSIS — R599 Enlarged lymph nodes, unspecified: Secondary | ICD-10-CM

## 2018-01-13 DIAGNOSIS — R918 Other nonspecific abnormal finding of lung field: Secondary | ICD-10-CM

## 2018-01-13 DIAGNOSIS — R0602 Shortness of breath: Secondary | ICD-10-CM | POA: Insufficient documentation

## 2018-01-13 LAB — CBC WITH DIFFERENTIAL (CANCER CENTER ONLY)
Basophils Absolute: 0 10*3/uL (ref 0.0–0.1)
Basophils Relative: 0 %
Eosinophils Absolute: 0.1 10*3/uL (ref 0.0–0.5)
Eosinophils Relative: 1 %
HCT: 40.6 % (ref 34.8–46.6)
Hemoglobin: 13.7 g/dL (ref 11.6–15.9)
Lymphocytes Relative: 19 %
Lymphs Abs: 1.2 10*3/uL (ref 0.9–3.3)
MCH: 31.7 pg (ref 25.1–34.0)
MCHC: 33.8 g/dL (ref 31.5–36.0)
MCV: 93.9 fL (ref 79.5–101.0)
Monocytes Absolute: 0.6 10*3/uL (ref 0.1–0.9)
Monocytes Relative: 10 %
Neutro Abs: 4.6 10*3/uL (ref 1.5–6.5)
Neutrophils Relative %: 70 %
Platelet Count: 248 10*3/uL (ref 145–400)
RBC: 4.33 MIL/uL (ref 3.70–5.45)
RDW: 14 % (ref 11.2–14.5)
WBC Count: 6.5 10*3/uL (ref 3.9–10.3)

## 2018-01-13 LAB — CMP (CANCER CENTER ONLY)
ALT: 18 U/L (ref 0–44)
AST: 21 U/L (ref 15–41)
Albumin: 4.3 g/dL (ref 3.5–5.0)
Alkaline Phosphatase: 116 U/L (ref 38–126)
Anion gap: 12 (ref 5–15)
BUN: 14 mg/dL (ref 8–23)
CO2: 24 mmol/L (ref 22–32)
Calcium: 10.7 mg/dL — ABNORMAL HIGH (ref 8.9–10.3)
Chloride: 106 mmol/L (ref 98–111)
Creatinine: 0.8 mg/dL (ref 0.44–1.00)
GFR, Est AFR Am: >60 mL/min (ref >60)
GFR, Estimated: >60 mL/min (ref >60)
Glucose, Bld: 96 mg/dL (ref 70–99)
Potassium: 4.5 mmol/L (ref 3.5–5.1)
Sodium: 142 mmol/L (ref 135–145)
Total Bilirubin: 0.5 mg/dL (ref 0.3–1.2)
Total Protein: 7.8 g/dL (ref 6.5–8.1)

## 2018-01-13 LAB — TSH: TSH: 1.231 u[IU]/mL (ref 0.308–3.960)

## 2018-01-13 MED ORDER — SODIUM CHLORIDE 0.9 % IV SOLN
Freq: Once | INTRAVENOUS | Status: AC
  Administered 2018-01-13: 15:00:00 via INTRAVENOUS
  Filled 2018-01-13: qty 250

## 2018-01-13 MED ORDER — SODIUM CHLORIDE 0.9 % IV SOLN
11.0000 mg/kg | Freq: Once | INTRAVENOUS | Status: AC
  Administered 2018-01-13: 620 mg via INTRAVENOUS
  Filled 2018-01-13: qty 10

## 2018-01-13 NOTE — Patient Instructions (Signed)
[facility] Discharge Instructions for Patients Receiving Chemotherapy  Today you received the following chemotherapy agents: Durvalumab (Imfinzi)   To help prevent nausea and vomiting after your treatment, we encourage you to take your nausea medication as directed.   If you develop nausea and vomiting that is not controlled by your nausea medication, call the clinic.   BELOW ARE SYMPTOMS THAT SHOULD BE REPORTED IMMEDIATELY:  *FEVER GREATER THAN 100.5 F  *CHILLS WITH OR WITHOUT FEVER  NAUSEA AND VOMITING THAT IS NOT CONTROLLED WITH YOUR NAUSEA MEDICATION  *UNUSUAL SHORTNESS OF BREATH  *UNUSUAL BRUISING OR BLEEDING  TENDERNESS IN MOUTH AND THROAT WITH OR WITHOUT PRESENCE OF ULCERS  *URINARY PROBLEMS  *BOWEL PROBLEMS  UNUSUAL RASH Items with * indicate a potential emergency and should be followed up as soon as possible.  Feel free to call the clinic should you have any questions or concerns. The clinic phone number is [phone].  Please show the CHEMO ALERT CARD at check-in to the Emergency Department and triage nurse.   

## 2018-01-13 NOTE — Progress Notes (Signed)
[place]  Telephone:(336) [phone] Fax:(336) [phone]  Clinic Follow up Note   Patient Care Team: [name], MD as PCP - General 01/13/2018  DIAGNOSIS: Stage IIIA (T2a,N2, M0) non-small cell lung cancer,adenocarcinomapresenting with right lower lobe lung mass in addition to subcarinal lymphadenopathy and suspicious metastatic pulmonary nodules diagnosed in February 2019.  PRIOR THERAPY: Concurrent chemoradiation with carboplatin for an AUC of 2 with paclitaxel 45 mg/m weekly. First dosegiven on3/18/2019.  Status post 7 cycles.  Last dose was giving August 09, 2017 with partial response.  CURRENT THERAPY: Consolidation immunotherapy with Imfinzi (Durvalumab) 10 mg/KG every 2 weeks, first dose 09/23/2017.  Status post 8 cycles.  INTERVAL HISTORY: Katelyn Kennedy returns for follow up and next cycle as scheduled. She completed cycle 8 Imfinzi on 9/19. She feels good. Denies fatigue. Appetite is normal. Denies n/v/c/d. Occasional cough with white phlegm is at baseline. Denies chest pain, hemoptysis, or dyspnea. No recent fever or chills. Melatonin helps insomnia. She has no specific concerns today.    MEDICAL HISTORY:  Past Medical History:  Diagnosis Date  . Allergic rhinitis   . Arthritis   . Diabetes mellitus without complication (HCC)    diet control/no meds per pt  . Dyspnea   . History of chicken pox   . Smoker     SURGICAL HISTORY: Past Surgical History:  Procedure Laterality Date  . ANKLE FRACTURE SURGERY Left 10 years ago   "hard to wake up from anesthesia" per pt  . [place]  . BREAST BIOPSY Right 1978   BENIGN CYST  . BREAST EXCISIONAL BIOPSY Left 2011   Benign  . BREAST EXCISIONAL BIOPSY Right 1985   Benign   . CATARACT EXTRACTION    . CATARACT EXTRACTION W/ INTRAOCULAR LENS IMPLANT Bilateral   . COLONOSCOPY  2011  . ELECTROMAGNETIC NAVIGATION BROCHOSCOPY N/A 06/08/2017   Procedure: ELECTROMAGNETIC NAVIGATION  BRONCHOSCOPY;  Surgeon: Flora Lipps, MD;  Location: [facility] ORS;  Service: Cardiopulmonary;  Laterality: N/A;  . POLYPECTOMY  2011  . 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 TUBAL LIGATION      I have reviewed the social history and family history with the patient and they are unchanged from previous note.  ALLERGIES:  is allergic to codeine.  MEDICATIONS:  Current Outpatient Medications  Medication Sig Dispense Refill  . atorvastatin (LIPITOR) 20 MG tablet TAKE 1 TABLET BY MOUTH EVERY DAY 90 tablet 0  . lisinopril (PRINIVIL,ZESTRIL) 5 MG tablet Take 0.5 tablets (2.5 mg total) by mouth daily. 45 tablet 3  . Multiple Vitamin (MULTIVITAMIN) capsule Take 1 capsule by mouth daily.      . Tetrahydrozoline HCl (VISINE OP) Place 1 drop into both eyes daily as needed (for dry eyes).     Marland Kitchen acetaminophen (TYLENOL) 500 MG tablet Take 500 mg by mouth daily as needed for moderate pain or headache.    . Calcium Carbonate-Vitamin D (CALCIUM + D) 600-200 MG-UNIT per tablet Take 1 tablet by mouth daily.     . diphenhydramine-acetaminophen (TYLENOL PM) 25-500 MG TABS tablet Take 2 tablets by mouth at bedtime as needed.    . fluticasone (FLONASE) 50 MCG/ACT nasal spray PLACE 2 SPRAYS INTO THE NOSE DAILY. 16 g 5  . prochlorperazine (COMPAZINE) 10 MG tablet Take 1 tablet (10 mg total) by  mouth every 6 (six) hours as needed for nausea or vomiting. 30 tablet 0  . sucralfate (CARAFATE) 1 g tablet Take 1 tablet (1 g total) by mouth 4 (four) times daily. 120 tablet 2  . traMADol (ULTRAM) 50 MG tablet Take 1 tablet (50 mg total) by mouth every 6 (six) hours as needed for moderate pain. 15 tablet 0   No current facility-administered medications for this visit.    Facility-Administered Medications Ordered in Other Visits    Medication Dose Route Frequency Provider Last Rate Last Dose  . durvalumab (IMFINZI) 620 mg in sodium chloride 0.9 % 100 mL chemo infusion  11 mg/kg (Treatment Plan Recorded) Intravenous Once Curt Bears, MD        PHYSICAL EXAMINATION: ECOG PERFORMANCE STATUS: 0 - Asymptomatic  Vitals:   01/13/18 1353  BP: 123/63  Pulse: 99  Resp: 18  Temp: 99.1 F (37.3 C)  SpO2: 95%   Filed Weights   01/13/18 1353  Weight: 124 lb 12.8 oz (56.6 kg)    GENERAL:alert, no distress and comfortable SKIN:  no rashes or significant lesions EYES: sclera clear OROPHARYNX:no thrush or ulcers  LUNGS: clear to auscultation with normal breathing effort HEART: regular rate & rhythm, no lower extremity edema ABDOMEN:abdomen soft, non-tender and normal bowel sounds Musculoskeletal:no cyanosis of digits and no clubbing  NEURO: alert & oriented x 3 with fluent speech, no focal motor deficits  LABORATORY DATA:  I have reviewed the data as listed CBC Latest Ref Rng & Units 01/13/2018 12/30/2017 12/16/2017  WBC 3.9 - 10.3 K/uL 6.5 6.7 4.8  Hemoglobin 11.6 - 15.9 g/dL 13.7 13.6 13.5  Hematocrit 34.8 - 46.6 % 40.6 40.3 40.2  Platelets 145 - 400 K/uL 248 232 207     CMP Latest Ref Rng & Units 01/13/2018 12/30/2017 12/16/2017  Glucose 70 - 99 mg/dL 96 95 97  BUN 8 - 23 mg/dL 14 16 17   Creatinine 0.44 - 1.00 mg/dL 0.80 0.80 0.80  Sodium 135 - 145 mmol/L 142 141 141  Potassium 3.5 - 5.1 mmol/L 4.5 4.2 4.4  Chloride 98 - 111 mmol/L 106 106 108  CO2 22 - 32 mmol/L 24 23 24   Calcium 8.9 - 10.3 mg/dL 10.7(H) 10.5(H) 10.4(H)  Total Protein 6.5 - 8.1 g/dL 7.8 7.5 7.4  Total Bilirubin 0.3 - 1.2 mg/dL 0.5 0.4 0.5  Alkaline Phos 38 - 126 U/L 116 107 108  AST 15 - 41 U/L 21 20 21   ALT 0 - 44 U/L 18 18 17       RADIOGRAPHIC STUDIES: I have personally reviewed the radiological images as listed and agreed with the findings in the report. No results found.   ASSESSMENT & PLAN: This is a very pleasant 72 year  old white female with suspicious for stage IIIA non-small cell lung cancer, adenocarcinoma, presented mainly with right lower lobe lung mass in addition to subcarinal lymphadenopathy but there was suspicious metastatic pulmonary nodules. S/p concurrent chemoradiation with weekly carboplatin and paclitaxel.  She tolerated her treatment well with no concerning complaints. Currently undergoing consolidation treatment with immunotherapy with Imfinzi (Durvalumab) status post 8 cycles. She continues to tolerate well without significant side effects. Labs reviewed, CBC and CMP stable, adequate for treatment. Calcium is mildly elevated, she does not take supplement. I recommend she increase po hydration, and avoid dairy and calcium in her diet. She agrees. I recommend for her to proceed with cycle 9 today. She will return in 2 weeks for follow up with Dr. Julien Nordmann  and cycle 10.   All questions were answered. The patient knows to call the clinic with any problems, questions or concerns. No barriers to learning was detected.     Alla Feeling, NP 01/13/18

## 2018-01-14 ENCOUNTER — Telehealth: Payer: Self-pay | Admitting: Hematology

## 2018-01-14 NOTE — Telephone Encounter (Signed)
Appts scheduled avs/calendar printed per 10/4 los

## 2018-01-16 ENCOUNTER — Other Ambulatory Visit: Payer: Self-pay | Admitting: Family Medicine

## 2018-01-22 LAB — HM DIABETES EYE EXAM

## 2018-01-24 ENCOUNTER — Encounter: Payer: Self-pay | Admitting: Family Medicine

## 2018-01-27 ENCOUNTER — Encounter: Payer: Self-pay | Admitting: Internal Medicine

## 2018-01-27 ENCOUNTER — Inpatient Hospital Stay: Payer: PPO | Admitting: Internal Medicine

## 2018-01-27 ENCOUNTER — Inpatient Hospital Stay: Payer: PPO

## 2018-01-27 ENCOUNTER — Telehealth: Payer: Self-pay | Admitting: Internal Medicine

## 2018-01-27 VITALS — BP 117/61 | HR 77 | Temp 98.6°F | Resp 16 | Ht 59.0 in | Wt 126.8 lb

## 2018-01-27 DIAGNOSIS — Z923 Personal history of irradiation: Secondary | ICD-10-CM

## 2018-01-27 DIAGNOSIS — Z79899 Other long term (current) drug therapy: Secondary | ICD-10-CM

## 2018-01-27 DIAGNOSIS — C3431 Malignant neoplasm of lower lobe, right bronchus or lung: Secondary | ICD-10-CM

## 2018-01-27 DIAGNOSIS — Z9221 Personal history of antineoplastic chemotherapy: Secondary | ICD-10-CM

## 2018-01-27 DIAGNOSIS — R0602 Shortness of breath: Secondary | ICD-10-CM | POA: Diagnosis not present

## 2018-01-27 DIAGNOSIS — R599 Enlarged lymph nodes, unspecified: Secondary | ICD-10-CM | POA: Diagnosis not present

## 2018-01-27 DIAGNOSIS — Z5112 Encounter for antineoplastic immunotherapy: Secondary | ICD-10-CM | POA: Diagnosis not present

## 2018-01-27 DIAGNOSIS — E119 Type 2 diabetes mellitus without complications: Secondary | ICD-10-CM | POA: Diagnosis not present

## 2018-01-27 DIAGNOSIS — M129 Arthropathy, unspecified: Secondary | ICD-10-CM

## 2018-01-27 DIAGNOSIS — R918 Other nonspecific abnormal finding of lung field: Secondary | ICD-10-CM

## 2018-01-27 LAB — CBC WITH DIFFERENTIAL (CANCER CENTER ONLY)
Abs Immature Granulocytes: 0.02 10*3/uL (ref 0.00–0.07)
Basophils Absolute: 0 10*3/uL (ref 0.0–0.1)
Basophils Relative: 0 %
Eosinophils Absolute: 0.1 10*3/uL (ref 0.0–0.5)
Eosinophils Relative: 1 %
HCT: 40.7 % (ref 36.0–46.0)
Hemoglobin: 13.8 g/dL (ref 12.0–15.0)
Immature Granulocytes: 0 %
Lymphocytes Relative: 16 %
Lymphs Abs: 1 10*3/uL (ref 0.7–4.0)
MCH: 32.4 pg (ref 26.0–34.0)
MCHC: 33.9 g/dL (ref 30.0–36.0)
MCV: 95.5 fL (ref 80.0–100.0)
Monocytes Absolute: 0.7 10*3/uL (ref 0.1–1.0)
Monocytes Relative: 10 %
Neutro Abs: 4.5 10*3/uL (ref 1.7–7.7)
Neutrophils Relative %: 73 %
Platelet Count: 223 10*3/uL (ref 150–400)
RBC: 4.26 MIL/uL (ref 3.87–5.11)
RDW: 13.2 % (ref 11.5–15.5)
WBC Count: 6.3 10*3/uL (ref 4.0–10.5)
nRBC: 0 % (ref 0.0–0.2)

## 2018-01-27 LAB — CMP (CANCER CENTER ONLY)
ALT: 19 U/L (ref 0–44)
AST: 22 U/L (ref 15–41)
Albumin: 4.1 g/dL (ref 3.5–5.0)
Alkaline Phosphatase: 104 U/L (ref 38–126)
Anion gap: 11 (ref 5–15)
BUN: 12 mg/dL (ref 8–23)
CO2: 23 mmol/L (ref 22–32)
Calcium: 10.1 mg/dL (ref 8.9–10.3)
Chloride: 109 mmol/L (ref 98–111)
Creatinine: 0.74 mg/dL (ref 0.44–1.00)
GFR, Est AFR Am: >60 mL/min (ref >60)
GFR, Estimated: >60 mL/min (ref >60)
Glucose, Bld: 96 mg/dL (ref 70–99)
Potassium: 4.1 mmol/L (ref 3.5–5.1)
Sodium: 143 mmol/L (ref 135–145)
Total Bilirubin: 0.5 mg/dL (ref 0.3–1.2)
Total Protein: 7.4 g/dL (ref 6.5–8.1)

## 2018-01-27 MED ORDER — SODIUM CHLORIDE 0.9 % IV SOLN
10.8000 mg/kg | Freq: Once | INTRAVENOUS | Status: AC
  Administered 2018-01-27: 620 mg via INTRAVENOUS
  Filled 2018-01-27: qty 10

## 2018-01-27 MED ORDER — SODIUM CHLORIDE 0.9 % IV SOLN
Freq: Once | INTRAVENOUS | Status: AC
  Administered 2018-01-27: 13:00:00 via INTRAVENOUS
  Filled 2018-01-27: qty 250

## 2018-01-27 NOTE — Telephone Encounter (Signed)
3 cycles already scheduled per 10/17 los - no additional appts added.

## 2018-01-27 NOTE — Progress Notes (Signed)
[facility] [phone]   [phone]  OFFICE PROGRESS NOTE  [name], MD [place] [address]  DIAGNOSIS: Stage IIIA (T2a,N2, M0) non-small cell lung cancer,adenocarcinomapresenting with right lower lobe lung mass in addition to subcarinal lymphadenopathy and suspicious metastatic pulmonary nodules diagnosed in February 2019.  PRIOR THERAPY: Concurrent chemoradiation with carboplatin for an AUC of 2 with paclitaxel 45 mg/m weekly. First dose given on 06/28/2017.  Status post 7 cycles.  Last dose was giving August 09, 2017 with partial response.  CURRENT THERAPY: Consolidation immunotherapy with Imfinzi (Durvalumab) 10 mg/KG every 2 weeks, first dose 09/23/2017.  Status post 9 cycles.  INTERVAL HISTORY: Katelyn Kennedy 72 y.o. female returns to the clinic today for follow-up visit.  The patient is currently on treatment with Imfinzi (Durvalumab) and has been tolerating it fairly well.  She denied having any chest pain, shortness of breath, cough or hemoptysis.  She has no fever or chills.  She has no nausea, vomiting, diarrhea or constipation.  She is here today for evaluation before starting cycle #10.  MEDICAL HISTORY: Past Medical History:  Diagnosis Date  . Allergic rhinitis   . Arthritis   . Diabetes mellitus without complication (HCC)    diet control/no meds per pt  . Dyspnea   . History of chicken pox   . Smoker     ALLERGIES:  is allergic to codeine.  MEDICATIONS:  Current Outpatient Medications  Medication Sig Dispense Refill  . acetaminophen (TYLENOL) 500 MG tablet Take 500 mg by mouth daily as needed for moderate pain or headache.    Marland Kitchen atorvastatin (LIPITOR) 20 MG tablet TAKE 1 TABLET BY MOUTH EVERY DAY 90 tablet 0  . Calcium Carbonate-Vitamin D (CALCIUM + D) 600-200 MG-UNIT per tablet Take 1 tablet by mouth daily.     . diphenhydramine-acetaminophen (TYLENOL PM) 25-500 MG TABS tablet Take 2 tablets  by mouth at bedtime as needed.    . fluticasone (FLONASE) 50 MCG/ACT nasal spray PLACE 2 SPRAYS INTO THE NOSE DAILY. 16 g 5  . lisinopril (PRINIVIL,ZESTRIL) 5 MG tablet TAKE 1/2 TABLET BY MOUTH ONCE DAILY 45 tablet 0  . Multiple Vitamin (MULTIVITAMIN) capsule Take 1 capsule by mouth daily.      . prochlorperazine (COMPAZINE) 10 MG tablet Take 1 tablet (10 mg total) by mouth every 6 (six) hours as needed for nausea or vomiting. 30 tablet 0  . sucralfate (CARAFATE) 1 g tablet Take 1 tablet (1 g total) by mouth 4 (four) times daily. 120 tablet 2  . Tetrahydrozoline HCl (VISINE OP) Place 1 drop into both eyes daily as needed (for dry eyes).     . traMADol (ULTRAM) 50 MG tablet Take 1 tablet (50 mg total) by mouth every 6 (six) hours as needed for moderate pain. 15 tablet 0   No current facility-administered medications for this visit.     SURGICAL HISTORY:  Past Surgical History:  Procedure Laterality Date  . ANKLE FRACTURE SURGERY Left 10 years ago   "hard to wake up from anesthesia" per pt  . [place]  . BREAST BIOPSY Right 1978   BENIGN CYST  . BREAST EXCISIONAL BIOPSY Left 2011   Benign  . BREAST EXCISIONAL BIOPSY Right 1985   Benign   . CATARACT EXTRACTION    . CATARACT EXTRACTION W/ INTRAOCULAR LENS IMPLANT Bilateral   . COLONOSCOPY  2011  . ELECTROMAGNETIC NAVIGATION BROCHOSCOPY N/A 06/08/2017  Procedure: ELECTROMAGNETIC NAVIGATION BRONCHOSCOPY;  Surgeon: Flora Lipps, MD;  Location: [facility];  Service: Cardiopulmonary;  Laterality: N/A;  . POLYPECTOMY  2011  . 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 TUBAL LIGATION      REVIEW OF SYSTEMS:  A comprehensive review of systems was negative.   PHYSICAL EXAMINATION: General appearance: alert, cooperative and no  distress Head: Normocephalic, without obvious abnormality, atraumatic Neck: no adenopathy, no JVD, supple, symmetrical, trachea midline and thyroid not enlarged, symmetric, no tenderness/mass/nodules Lymph nodes: Cervical, supraclavicular, and axillary nodes normal. Resp: clear to auscultation bilaterally Back: symmetric, no curvature. ROM normal. No CVA tenderness. Cardio: regular rate and rhythm, S1, S2 normal, no murmur, click, rub or gallop GI: soft, non-tender; bowel sounds normal; no masses,  no organomegaly Extremities: extremities normal, atraumatic, no cyanosis or edema  ECOG PERFORMANCE STATUS: 0 - Asymptomatic  Blood pressure 117/61, pulse 77, temperature 98.6 F (37 C), temperature source Oral, resp. rate 16, height 4\' 11"  (1.499 m), weight 126 lb 12.8 oz (57.5 kg), SpO2 97 %.  LABORATORY DATA: Lab Results  Component Value Date   WBC 6.3 01/27/2018   HGB 13.8 01/27/2018   HCT 40.7 01/27/2018   MCV 95.5 01/27/2018   PLT 223 01/27/2018      Chemistry      Component Value Date/Time   NA 143 01/27/2018 1134   K 4.1 01/27/2018 1134   CL 109 01/27/2018 1134   CO2 23 01/27/2018 1134   BUN 12 01/27/2018 1134   CREATININE 0.74 01/27/2018 1134      Component Value Date/Time   CALCIUM 10.1 01/27/2018 1134   ALKPHOS 104 01/27/2018 1134   AST 22 01/27/2018 1134   ALT 19 01/27/2018 1134   BILITOT 0.5 01/27/2018 1134       RADIOGRAPHIC STUDIES: No results found.  ASSESSMENT AND PLAN: This is a very pleasant 72 years old white female with suspicious for stage IIIA non-small cell lung cancer, adenocarcinoma presented mainly with right lower lobe lung mass in addition to subcarinal lymphadenopathy but there was suspicious metastatic pulmonary nodules. The patient completed a course of concurrent chemoradiation with weekly carboplatin and paclitaxel.  She tolerated her treatment well with no concerning complaints. The patient is currently undergoing consolidation treatment  with immunotherapy with Imfinzi (Durvalumab) status post 9 cycles. The patient is tolerating this treatment well. I recommended for her to proceed with cycle #10 today as scheduled. I will see her back for follow-up visit in 2 weeks for evaluation before starting cycle #11. She was advised to call immediately if she has any concerning symptoms in the interval. All questions were answered. The patient knows to call the clinic with any problems, questions or concerns. We can certainly see the patient much sooner if necessary.   Disclaimer: This note was dictated with voice recognition software. Similar sounding words can inadvertently be transcribed and may not be corrected upon review.

## 2018-01-27 NOTE — Patient Instructions (Signed)
[facility] Discharge Instructions for Patients Receiving Chemotherapy  Today you received the following chemotherapy agents: Imfinzi.  To help prevent nausea and vomiting after your treatment, we encourage you to take your nausea medication as directed.   If you develop nausea and vomiting that is not controlled by your nausea medication, call the clinic.   BELOW ARE SYMPTOMS THAT SHOULD BE REPORTED IMMEDIATELY:  *FEVER GREATER THAN 100.5 F  *CHILLS WITH OR WITHOUT FEVER  NAUSEA AND VOMITING THAT IS NOT CONTROLLED WITH YOUR NAUSEA MEDICATION  *UNUSUAL SHORTNESS OF BREATH  *UNUSUAL BRUISING OR BLEEDING  TENDERNESS IN MOUTH AND THROAT WITH OR WITHOUT PRESENCE OF ULCERS  *URINARY PROBLEMS  *BOWEL PROBLEMS  UNUSUAL RASH Items with * indicate a potential emergency and should be followed up as soon as possible.  Feel free to call the clinic should you have any questions or concerns. The clinic phone number is [phone].  Please show the CHEMO ALERT CARD at check-in to the Emergency Department and triage nurse.   

## 2018-02-10 ENCOUNTER — Inpatient Hospital Stay: Payer: PPO

## 2018-02-10 ENCOUNTER — Other Ambulatory Visit: Payer: Self-pay | Admitting: Internal Medicine

## 2018-02-10 ENCOUNTER — Encounter: Payer: Self-pay | Admitting: Internal Medicine

## 2018-02-10 ENCOUNTER — Inpatient Hospital Stay (HOSPITAL_BASED_OUTPATIENT_CLINIC_OR_DEPARTMENT_OTHER): Payer: PPO | Admitting: Internal Medicine

## 2018-02-10 VITALS — BP 112/65 | HR 81 | Temp 98.5°F | Resp 18 | Ht 59.0 in | Wt 127.3 lb

## 2018-02-10 DIAGNOSIS — E119 Type 2 diabetes mellitus without complications: Secondary | ICD-10-CM

## 2018-02-10 DIAGNOSIS — Z79899 Other long term (current) drug therapy: Secondary | ICD-10-CM | POA: Diagnosis not present

## 2018-02-10 DIAGNOSIS — Z923 Personal history of irradiation: Secondary | ICD-10-CM | POA: Diagnosis not present

## 2018-02-10 DIAGNOSIS — Z9221 Personal history of antineoplastic chemotherapy: Secondary | ICD-10-CM

## 2018-02-10 DIAGNOSIS — Z5112 Encounter for antineoplastic immunotherapy: Secondary | ICD-10-CM | POA: Diagnosis not present

## 2018-02-10 DIAGNOSIS — C3431 Malignant neoplasm of lower lobe, right bronchus or lung: Secondary | ICD-10-CM

## 2018-02-10 DIAGNOSIS — R0602 Shortness of breath: Secondary | ICD-10-CM

## 2018-02-10 DIAGNOSIS — M129 Arthropathy, unspecified: Secondary | ICD-10-CM | POA: Diagnosis not present

## 2018-02-10 DIAGNOSIS — R918 Other nonspecific abnormal finding of lung field: Secondary | ICD-10-CM

## 2018-02-10 DIAGNOSIS — R599 Enlarged lymph nodes, unspecified: Secondary | ICD-10-CM | POA: Diagnosis not present

## 2018-02-10 LAB — CMP (CANCER CENTER ONLY)
ALT: 19 U/L (ref 0–44)
AST: 19 U/L (ref 15–41)
Albumin: 3.8 g/dL (ref 3.5–5.0)
Alkaline Phosphatase: 108 U/L (ref 38–126)
Anion gap: 13 (ref 5–15)
BUN: 17 mg/dL (ref 8–23)
CO2: 20 mmol/L — ABNORMAL LOW (ref 22–32)
Calcium: 10.1 mg/dL (ref 8.9–10.3)
Chloride: 108 mmol/L (ref 98–111)
Creatinine: 0.83 mg/dL (ref 0.44–1.00)
GFR, Est AFR Am: >60 mL/min (ref >60)
GFR, Estimated: >60 mL/min (ref >60)
Glucose, Bld: 109 mg/dL — ABNORMAL HIGH (ref 70–99)
Potassium: 4.3 mmol/L (ref 3.5–5.1)
Sodium: 141 mmol/L (ref 135–145)
Total Bilirubin: 0.4 mg/dL (ref 0.3–1.2)
Total Protein: 7.4 g/dL (ref 6.5–8.1)

## 2018-02-10 LAB — CBC WITH DIFFERENTIAL (CANCER CENTER ONLY)
Abs Immature Granulocytes: 0.03 10*3/uL (ref 0.00–0.07)
Basophils Absolute: 0 10*3/uL (ref 0.0–0.1)
Basophils Relative: 0 %
Eosinophils Absolute: 0.1 10*3/uL (ref 0.0–0.5)
Eosinophils Relative: 1 %
HCT: 40.7 % (ref 36.0–46.0)
Hemoglobin: 13.7 g/dL (ref 12.0–15.0)
Immature Granulocytes: 0 %
Lymphocytes Relative: 13 %
Lymphs Abs: 1 10*3/uL (ref 0.7–4.0)
MCH: 31.6 pg (ref 26.0–34.0)
MCHC: 33.7 g/dL (ref 30.0–36.0)
MCV: 93.8 fL (ref 80.0–100.0)
Monocytes Absolute: 0.8 10*3/uL (ref 0.1–1.0)
Monocytes Relative: 11 %
Neutro Abs: 5.3 10*3/uL (ref 1.7–7.7)
Neutrophils Relative %: 75 %
Platelet Count: 244 10*3/uL (ref 150–400)
RBC: 4.34 MIL/uL (ref 3.87–5.11)
RDW: 12.7 % (ref 11.5–15.5)
WBC Count: 7.2 10*3/uL (ref 4.0–10.5)
nRBC: 0 % (ref 0.0–0.2)

## 2018-02-10 LAB — TSH: TSH: 2.766 u[IU]/mL (ref 0.308–3.960)

## 2018-02-10 MED ORDER — SODIUM CHLORIDE 0.9 % IV SOLN
10.8000 mg/kg | Freq: Once | INTRAVENOUS | Status: AC
  Administered 2018-02-10: 620 mg via INTRAVENOUS
  Filled 2018-02-10: qty 2.4

## 2018-02-10 MED ORDER — SODIUM CHLORIDE 0.9 % IV SOLN
Freq: Once | INTRAVENOUS | Status: AC
  Administered 2018-02-10: 10:00:00 via INTRAVENOUS
  Filled 2018-02-10: qty 250

## 2018-02-10 NOTE — Patient Instructions (Signed)
[facility] Discharge Instructions for Patients Receiving Chemotherapy  Today you received the following chemotherapy agents: Imfinzi.  To help prevent nausea and vomiting after your treatment, we encourage you to take your nausea medication as directed.   If you develop nausea and vomiting that is not controlled by your nausea medication, call the clinic.   BELOW ARE SYMPTOMS THAT SHOULD BE REPORTED IMMEDIATELY:  *FEVER GREATER THAN 100.5 F  *CHILLS WITH OR WITHOUT FEVER  NAUSEA AND VOMITING THAT IS NOT CONTROLLED WITH YOUR NAUSEA MEDICATION  *UNUSUAL SHORTNESS OF BREATH  *UNUSUAL BRUISING OR BLEEDING  TENDERNESS IN MOUTH AND THROAT WITH OR WITHOUT PRESENCE OF ULCERS  *URINARY PROBLEMS  *BOWEL PROBLEMS  UNUSUAL RASH Items with * indicate a potential emergency and should be followed up as soon as possible.  Feel free to call the clinic should you have any questions or concerns. The clinic phone number is [phone].  Please show the CHEMO ALERT CARD at check-in to the Emergency Department and triage nurse.   

## 2018-02-10 NOTE — Progress Notes (Signed)
[place] [phone]   [phone]  OFFICE PROGRESS NOTE  Katelyn Sanders, MD [place] [address]  DIAGNOSIS: Stage IIIA (T2a,N2, M0) non-small cell lung cancer,adenocarcinomapresenting with right lower lobe lung mass in addition to subcarinal lymphadenopathy and suspicious metastatic pulmonary nodules diagnosed in February 2019.  PRIOR THERAPY: Concurrent chemoradiation with carboplatin for an AUC of 2 with paclitaxel 45 mg/m weekly. First dose given on 06/28/2017.  Status post 7 cycles.  Last dose was giving August 09, 2017 with partial response.  CURRENT THERAPY: Consolidation immunotherapy with Imfinzi (Durvalumab) 10 mg/KG every 2 weeks, first dose 09/23/2017.  Status post 10 cycles.  INTERVAL HISTORY: ADRIEANNA Kennedy 72 y.o. female returns to the clinic today for follow-up visit.  The patient is feeling fine today with no concerning complaints.  She continues to tolerate her treatment with Imfinzi fairly well.  She denied having any chest pain, shortness of breath, cough or hemoptysis.  She denied having any fever or chills.  She has no nausea, vomiting, diarrhea or constipation.  She is here today for evaluation before starting cycle #11.  MEDICAL HISTORY: Past Medical History:  Diagnosis Date  . Allergic rhinitis   . Arthritis   . Diabetes mellitus without complication (HCC)    diet control/no meds per pt  . Dyspnea   . History of chicken pox   . Smoker     ALLERGIES:  is allergic to codeine.  MEDICATIONS:  Current Outpatient Medications  Medication Sig Dispense Refill  . acetaminophen (TYLENOL) 500 MG tablet Take 500 mg by mouth daily as needed for moderate pain or headache.    Marland Kitchen atorvastatin (LIPITOR) 20 MG tablet TAKE 1 TABLET BY MOUTH EVERY DAY 90 tablet 0  . Calcium Carbonate-Vitamin D (CALCIUM + D) 600-200 MG-UNIT per tablet Take 1 tablet by mouth daily.     . diphenhydramine-acetaminophen (TYLENOL  PM) 25-500 MG TABS tablet Take 2 tablets by mouth at bedtime as needed.    . fluticasone (FLONASE) 50 MCG/ACT nasal spray PLACE 2 SPRAYS INTO THE NOSE DAILY. 16 g 5  . lisinopril (PRINIVIL,ZESTRIL) 5 MG tablet TAKE 1/2 TABLET BY MOUTH ONCE DAILY 45 tablet 0  . Multiple Vitamin (MULTIVITAMIN) capsule Take 1 capsule by mouth daily.      . prochlorperazine (COMPAZINE) 10 MG tablet Take 1 tablet (10 mg total) by mouth every 6 (six) hours as needed for nausea or vomiting. (Patient not taking: Reported on 01/27/2018) 30 tablet 0  . sucralfate (CARAFATE) 1 g tablet Take 1 tablet (1 g total) by mouth 4 (four) times daily. (Patient not taking: Reported on 01/27/2018) 120 tablet 2  . Tetrahydrozoline HCl (VISINE OP) Place 1 drop into both eyes daily as needed (for dry eyes).     . traMADol (ULTRAM) 50 MG tablet Take 1 tablet (50 mg total) by mouth every 6 (six) hours as needed for moderate pain. (Patient not taking: Reported on 01/27/2018) 15 tablet 0   No current facility-administered medications for this visit.     SURGICAL HISTORY:  Past Surgical History:  Procedure Laterality Date  . ANKLE FRACTURE SURGERY Left 10 years ago   "hard to wake up from anesthesia" per pt  . [place]  . BREAST BIOPSY Right 1978   BENIGN CYST  . BREAST EXCISIONAL BIOPSY Left 2011   Benign  . BREAST EXCISIONAL BIOPSY Right 1985   Benign   . CATARACT EXTRACTION    .  CATARACT EXTRACTION W/ INTRAOCULAR LENS IMPLANT Bilateral   . COLONOSCOPY  2011  . ELECTROMAGNETIC NAVIGATION BROCHOSCOPY N/A 06/08/2017   Procedure: ELECTROMAGNETIC NAVIGATION BRONCHOSCOPY;  Surgeon: Flora Lipps, MD;  Location: [facility] ORS;  Service: Cardiopulmonary;  Laterality: N/A;  . POLYPECTOMY  2011  . TRANSURETHRAL RESECTION OF BLADDER TUMOR WITH MITOMYCIN-C N/A 05/04/2017   Procedure: TRANSURETHRAL RESECTION OF BLADDER TUMOR WITH gemcitabine;  Surgeon: Abbie Sons, MD;  Location: [facility] ORS;  Service: Urology;  Laterality:  N/A;  . TUBAL LIGATION      REVIEW OF SYSTEMS:  A comprehensive review of systems was negative.   PHYSICAL EXAMINATION: General appearance: alert, cooperative and no distress Head: Normocephalic, without obvious abnormality, atraumatic Neck: no adenopathy, no JVD, supple, symmetrical, trachea midline and thyroid not enlarged, symmetric, no tenderness/mass/nodules Lymph nodes: Cervical, supraclavicular, and axillary nodes normal. Resp: clear to auscultation bilaterally Back: symmetric, no curvature. ROM normal. No CVA tenderness. Cardio: regular rate and rhythm, S1, S2 normal, no murmur, click, rub or gallop GI: soft, non-tender; bowel sounds normal; no masses,  no organomegaly Extremities: extremities normal, atraumatic, no cyanosis or edema  ECOG PERFORMANCE STATUS: 0 - Asymptomatic  Blood pressure 112/65, pulse 81, temperature 98.5 F (36.9 C), temperature source Oral, resp. rate 18, height 4\' 11"  (1.499 m), weight 127 lb 4.8 oz (57.7 kg), SpO2 98 %.  LABORATORY DATA: Lab Results  Component Value Date   WBC 7.2 02/10/2018   HGB 13.7 02/10/2018   HCT 40.7 02/10/2018   MCV 93.8 02/10/2018   PLT 244 02/10/2018      Chemistry      Component Value Date/Time   NA 141 02/10/2018 0831   K 4.3 02/10/2018 0831   CL 108 02/10/2018 0831   CO2 20 (L) 02/10/2018 0831   BUN 17 02/10/2018 0831   CREATININE 0.83 02/10/2018 0831      Component Value Date/Time   CALCIUM 10.1 02/10/2018 0831   ALKPHOS 108 02/10/2018 0831   AST 19 02/10/2018 0831   ALT 19 02/10/2018 0831   BILITOT 0.4 02/10/2018 0831       RADIOGRAPHIC STUDIES: No results found.  ASSESSMENT AND PLAN: This is a very pleasant 72 years old white female with suspicious for stage IIIA non-small cell lung cancer, adenocarcinoma presented mainly with right lower lobe lung mass in addition to subcarinal lymphadenopathy but there was suspicious metastatic pulmonary nodules. The patient completed a course of concurrent  chemoradiation with weekly carboplatin and paclitaxel.  She tolerated her treatment well with no concerning complaints. The patient is currently undergoing consolidation treatment with immunotherapy with Imfinzi (Durvalumab) status post 10 cycles. The patient continues to tolerate her treatment well. I recommended for her to proceed with cycle #11 today as scheduled. She will come back for follow-up visit in 2 weeks for evaluation before the next cycle of her treatment. She was advised to call immediately if she has any concerning symptoms in the interval. All questions were answered. The patient knows to call the clinic with any problems, questions or concerns. We can certainly see the patient much sooner if necessary.   Disclaimer: This note was dictated with voice recognition software. Similar sounding words can inadvertently be transcribed and may not be corrected upon review.

## 2018-02-11 ENCOUNTER — Telehealth: Payer: Self-pay | Admitting: Internal Medicine

## 2018-02-11 NOTE — Telephone Encounter (Signed)
Scheduled appt per 10/31 los - pt to get an updated schedule next visit.

## 2018-02-17 ENCOUNTER — Other Ambulatory Visit (INDEPENDENT_AMBULATORY_CARE_PROVIDER_SITE_OTHER): Payer: PPO

## 2018-02-17 ENCOUNTER — Telehealth: Payer: Self-pay | Admitting: Family Medicine

## 2018-02-17 DIAGNOSIS — E119 Type 2 diabetes mellitus without complications: Secondary | ICD-10-CM

## 2018-02-17 LAB — LIPID PANEL
Cholesterol: 166 mg/dL (ref 0–200)
HDL: 61.9 mg/dL (ref >39.00)
LDL Cholesterol: 81 mg/dL (ref 0–99)
NonHDL: 103.77
Total CHOL/HDL Ratio: 3
Triglycerides: 114 mg/dL (ref 0.0–149.0)
VLDL: 22.8 mg/dL (ref 0.0–40.0)

## 2018-02-17 LAB — COMPREHENSIVE METABOLIC PANEL
ALT: 16 U/L (ref 0–35)
AST: 18 U/L (ref 0–37)
Albumin: 4.3 g/dL (ref 3.5–5.2)
Alkaline Phosphatase: 88 U/L (ref 39–117)
BUN: 16 mg/dL (ref 6–23)
CO2: 27 mEq/L (ref 19–32)
Calcium: 9.9 mg/dL (ref 8.4–10.5)
Chloride: 105 mEq/L (ref 96–112)
Creatinine, Ser: 0.73 mg/dL (ref 0.40–1.20)
GFR: 82.84 mL/min (ref >60.00)
Glucose, Bld: 105 mg/dL — ABNORMAL HIGH (ref 70–99)
Potassium: 4.3 mEq/L (ref 3.5–5.1)
Sodium: 139 mEq/L (ref 135–145)
Total Bilirubin: 0.5 mg/dL (ref 0.2–1.2)
Total Protein: 7.1 g/dL (ref 6.0–8.3)

## 2018-02-17 LAB — HEMOGLOBIN A1C: Hgb A1c MFr Bld: 5.6 % (ref 4.6–6.5)

## 2018-02-17 NOTE — Telephone Encounter (Signed)
-----   Message from Ellamae Sia sent at 02/08/2018  9:01 AM EDT ----- Regarding: Lab orders for Thursday, 11.7.19 Lab orders for a f/u appt

## 2018-02-23 ENCOUNTER — Encounter: Payer: Self-pay | Admitting: Oncology

## 2018-02-23 ENCOUNTER — Inpatient Hospital Stay: Payer: PPO

## 2018-02-23 ENCOUNTER — Inpatient Hospital Stay: Payer: PPO | Attending: Internal Medicine

## 2018-02-23 ENCOUNTER — Telehealth: Payer: Self-pay | Admitting: Oncology

## 2018-02-23 ENCOUNTER — Inpatient Hospital Stay (HOSPITAL_BASED_OUTPATIENT_CLINIC_OR_DEPARTMENT_OTHER): Payer: PPO | Admitting: Oncology

## 2018-02-23 VITALS — BP 108/62 | HR 91 | Temp 98.4°F | Resp 16 | Ht 59.0 in | Wt 129.8 lb

## 2018-02-23 DIAGNOSIS — M129 Arthropathy, unspecified: Secondary | ICD-10-CM

## 2018-02-23 DIAGNOSIS — C3431 Malignant neoplasm of lower lobe, right bronchus or lung: Secondary | ICD-10-CM | POA: Insufficient documentation

## 2018-02-23 DIAGNOSIS — R918 Other nonspecific abnormal finding of lung field: Secondary | ICD-10-CM

## 2018-02-23 DIAGNOSIS — R599 Enlarged lymph nodes, unspecified: Secondary | ICD-10-CM | POA: Insufficient documentation

## 2018-02-23 DIAGNOSIS — Z5112 Encounter for antineoplastic immunotherapy: Secondary | ICD-10-CM

## 2018-02-23 DIAGNOSIS — E119 Type 2 diabetes mellitus without complications: Secondary | ICD-10-CM

## 2018-02-23 DIAGNOSIS — Z9221 Personal history of antineoplastic chemotherapy: Secondary | ICD-10-CM | POA: Insufficient documentation

## 2018-02-23 DIAGNOSIS — Z923 Personal history of irradiation: Secondary | ICD-10-CM

## 2018-02-23 DIAGNOSIS — Z79899 Other long term (current) drug therapy: Secondary | ICD-10-CM | POA: Diagnosis not present

## 2018-02-23 LAB — CMP (CANCER CENTER ONLY)
ALT: 16 U/L (ref 0–44)
AST: 18 U/L (ref 15–41)
Albumin: 3.7 g/dL (ref 3.5–5.0)
Alkaline Phosphatase: 103 U/L (ref 38–126)
Anion gap: 9 (ref 5–15)
BUN: 13 mg/dL (ref 8–23)
CO2: 24 mmol/L (ref 22–32)
Calcium: 10 mg/dL (ref 8.9–10.3)
Chloride: 108 mmol/L (ref 98–111)
Creatinine: 0.93 mg/dL (ref 0.44–1.00)
GFR, Est AFR Am: >60 mL/min (ref >60)
GFR, Estimated: 60 mL/min — ABNORMAL LOW (ref >60)
Glucose, Bld: 139 mg/dL — ABNORMAL HIGH (ref 70–99)
Potassium: 4 mmol/L (ref 3.5–5.1)
Sodium: 141 mmol/L (ref 135–145)
Total Bilirubin: 0.3 mg/dL (ref 0.3–1.2)
Total Protein: 7.1 g/dL (ref 6.5–8.1)

## 2018-02-23 LAB — CBC WITH DIFFERENTIAL (CANCER CENTER ONLY)
Abs Immature Granulocytes: 0.03 10*3/uL (ref 0.00–0.07)
Basophils Absolute: 0 10*3/uL (ref 0.0–0.1)
Basophils Relative: 0 %
Eosinophils Absolute: 0.1 10*3/uL (ref 0.0–0.5)
Eosinophils Relative: 1 %
HCT: 38.8 % (ref 36.0–46.0)
Hemoglobin: 13 g/dL (ref 12.0–15.0)
Immature Granulocytes: 1 %
Lymphocytes Relative: 13 %
Lymphs Abs: 0.9 10*3/uL (ref 0.7–4.0)
MCH: 31.4 pg (ref 26.0–34.0)
MCHC: 33.5 g/dL (ref 30.0–36.0)
MCV: 93.7 fL (ref 80.0–100.0)
Monocytes Absolute: 0.7 10*3/uL (ref 0.1–1.0)
Monocytes Relative: 10 %
Neutro Abs: 4.9 10*3/uL (ref 1.7–7.7)
Neutrophils Relative %: 75 %
Platelet Count: 222 10*3/uL (ref 150–400)
RBC: 4.14 MIL/uL (ref 3.87–5.11)
RDW: 12.9 % (ref 11.5–15.5)
WBC Count: 6.6 10*3/uL (ref 4.0–10.5)
nRBC: 0 % (ref 0.0–0.2)

## 2018-02-23 MED ORDER — SODIUM CHLORIDE 0.9 % IV SOLN
Freq: Once | INTRAVENOUS | Status: AC
  Administered 2018-02-23: 16:00:00 via INTRAVENOUS
  Filled 2018-02-23: qty 250

## 2018-02-23 MED ORDER — SODIUM CHLORIDE 0.9 % IV SOLN
10.8000 mg/kg | Freq: Once | INTRAVENOUS | Status: AC
  Administered 2018-02-23: 620 mg via INTRAVENOUS
  Filled 2018-02-23: qty 10

## 2018-02-23 NOTE — Patient Instructions (Signed)
[facility] Discharge Instructions for Patients Receiving Chemotherapy  Today you received the following chemotherapy agents: Imfinzi.  To help prevent nausea and vomiting after your treatment, we encourage you to take your nausea medication as directed.   If you develop nausea and vomiting that is not controlled by your nausea medication, call the clinic.   BELOW ARE SYMPTOMS THAT SHOULD BE REPORTED IMMEDIATELY:  *FEVER GREATER THAN 100.5 F  *CHILLS WITH OR WITHOUT FEVER  NAUSEA AND VOMITING THAT IS NOT CONTROLLED WITH YOUR NAUSEA MEDICATION  *UNUSUAL SHORTNESS OF BREATH  *UNUSUAL BRUISING OR BLEEDING  TENDERNESS IN MOUTH AND THROAT WITH OR WITHOUT PRESENCE OF ULCERS  *URINARY PROBLEMS  *BOWEL PROBLEMS  UNUSUAL RASH Items with * indicate a potential emergency and should be followed up as soon as possible.  Feel free to call the clinic should you have any questions or concerns. The clinic phone number is [phone].  Please show the CHEMO ALERT CARD at check-in to the Emergency Department and triage nurse.   

## 2018-02-23 NOTE — Telephone Encounter (Signed)
Scheduled appt per 11/13 los - gave patient AVS and calender per los.   

## 2018-02-23 NOTE — Assessment & Plan Note (Signed)
This is a very pleasant 72 year old white female with suspicious for stage IIIA non-small cell lung cancer, adenocarcinoma presented mainly with right lower lobe lung mass in addition to subcarinal lymphadenopathy but there was suspicious metastatic pulmonary nodules. The patient completed a course of concurrent chemoradiation with weekly carboplatin and paclitaxel.  She tolerated her treatment well with no concerning complaints. The patient is currently undergoing consolidation treatment with immunotherapy with Imfinzi (Durvalumab) status post 11 cycles. The patient continues to tolerate her treatment well. I recommended for her to proceed with cycle #12 today as scheduled. She will have a restaging CT scan of the chest prior to her next visit.  She will follow-up in 2 weeks for evaluation prior to cycle #13 and to review her restaging CT scan results.  She was advised to call immediately if she has any concerning symptoms in the interval. All questions were answered. The patient knows to call the clinic with any problems, questions or concerns. We can certainly see the patient much sooner if necessary.

## 2018-02-23 NOTE — Progress Notes (Signed)
[facility] OFFICE PROGRESS NOTE  [name], MD [place] [address]  DIAGNOSIS: Stage IIIA (T2a,N2, M0) non-small cell lung cancer,adenocarcinomapresenting with right lower lobe lung mass in addition to subcarinal lymphadenopathy and suspicious metastatic pulmonary nodules diagnosed in February 2019.  PRIOR THERAPY: Concurrent chemoradiation with carboplatin for an AUC of 2 with paclitaxel 45 mg/m weekly. First dosegiven on3/18/2019.  Status post 7 cycles.  Last dose was giving August 09, 2017 with partial response.  CURRENT THERAPY: Consolidation immunotherapy with Imfinzi (Durvalumab) 10 mg/KG every 2 weeks, first dose 09/23/2017.  Status post 11 cycles.  INTERVAL HISTORY: BLISS BEHNKE 72 y.o. female returns for routine follow-up visit by herself.  The patient is feeling fine and has no specific complaints.  She denies fevers and chills.  Denies chest pain, shortness of breath, cough, hemoptysis.  Denies nausea, vomiting, constipation, diarrhea.  Denies recent weight loss or night sweats.  She continues to tolerate treatment with Imfinzi fairly well.  The patient is here for evaluation prior to cycle #12.  MEDICAL HISTORY: Past Medical History:  Diagnosis Date  . Allergic rhinitis   . Arthritis   . Diabetes mellitus without complication (HCC)    diet control/no meds per pt  . Dyspnea   . History of chicken pox   . Smoker     ALLERGIES:  is allergic to codeine.  MEDICATIONS:  Current Outpatient Medications  Medication Sig Dispense Refill  . atorvastatin (LIPITOR) 20 MG tablet TAKE 1 TABLET BY MOUTH EVERY DAY 90 tablet 0  . lisinopril (PRINIVIL,ZESTRIL) 5 MG tablet TAKE 1/2 TABLET BY MOUTH ONCE DAILY 45 tablet 0  . Multiple Vitamin (MULTIVITAMIN) capsule Take 1 capsule by mouth daily.      Vladimir Faster Glycol-Propyl Glycol (SYSTANE) 0.4-0.3 % SOLN Apply to eye.    Marland Kitchen acetaminophen (TYLENOL) 500 MG tablet Take 500 mg by mouth daily  as needed for moderate pain or headache.    . Calcium Carbonate-Vitamin D (CALCIUM + D) 600-200 MG-UNIT per tablet Take 1 tablet by mouth daily.     . diphenhydramine-acetaminophen (TYLENOL PM) 25-500 MG TABS tablet Take 2 tablets by mouth at bedtime as needed.    . fluticasone (FLONASE) 50 MCG/ACT nasal spray PLACE 2 SPRAYS INTO THE NOSE DAILY. (Patient not taking: Reported on 02/23/2018) 16 g 5  . prochlorperazine (COMPAZINE) 10 MG tablet Take 1 tablet (10 mg total) by mouth every 6 (six) hours as needed for nausea or vomiting. (Patient not taking: Reported on 01/27/2018) 30 tablet 0  . traMADol (ULTRAM) 50 MG tablet Take 1 tablet (50 mg total) by mouth every 6 (six) hours as needed for moderate pain. (Patient not taking: Reported on 01/27/2018) 15 tablet 0   No current facility-administered medications for this visit.    Facility-Administered Medications Ordered in Other Visits  Medication Dose Route Frequency Provider Last Rate Last Dose  . durvalumab (IMFINZI) 620 mg in sodium chloride 0.9 % 100 mL chemo infusion  10.8 mg/kg (Treatment Plan Recorded) Intravenous Once Curt Bears, MD        SURGICAL HISTORY:  Past Surgical History:  Procedure Laterality Date  . ANKLE FRACTURE SURGERY Left 10 years ago   "hard to wake up from anesthesia" per pt  . [place]  . BREAST BIOPSY Right 1978   BENIGN CYST  . BREAST EXCISIONAL BIOPSY Left 2011   Benign  . BREAST EXCISIONAL BIOPSY Right 1985   Benign   .  CATARACT EXTRACTION    . CATARACT EXTRACTION W/ INTRAOCULAR LENS IMPLANT Bilateral   . COLONOSCOPY  2011  . ELECTROMAGNETIC NAVIGATION BROCHOSCOPY N/A 06/08/2017   Procedure: ELECTROMAGNETIC NAVIGATION BRONCHOSCOPY;  Surgeon: Flora Lipps, MD;  Location: [facility] ORS;  Service: Cardiopulmonary;  Laterality: N/A;  . POLYPECTOMY  2011  . TRANSURETHRAL RESECTION OF BLADDER TUMOR WITH MITOMYCIN-C N/A 05/04/2017   Procedure: TRANSURETHRAL RESECTION OF BLADDER TUMOR WITH  gemcitabine;  Surgeon: Abbie Sons, MD;  Location: [facility] ORS;  Service: Urology;  Laterality: N/A;  . TUBAL LIGATION      REVIEW OF SYSTEMS:   Review of Systems  Constitutional: Negative for appetite change, chills, fatigue, fever and unexpected weight change.  HENT:   Negative for mouth sores, nosebleeds, sore throat and trouble swallowing.   Eyes: Negative for eye problems and icterus.  Respiratory: Negative for cough, hemoptysis, shortness of breath and wheezing.   Cardiovascular: Negative for chest pain and leg swelling.  Gastrointestinal: Negative for abdominal pain, constipation, diarrhea, nausea and vomiting.  Genitourinary: Negative for bladder incontinence, difficulty urinating, dysuria, frequency and hematuria.   Musculoskeletal: Negative for back pain, gait problem, neck pain and neck stiffness.  Skin: Negative for itching and rash.  Neurological: Negative for dizziness, extremity weakness, gait problem, headaches, light-headedness and seizures.  Hematological: Negative for adenopathy. Does not bruise/bleed easily.  Psychiatric/Behavioral: Negative for confusion, depression and sleep disturbance. The patient is not nervous/anxious.     PHYSICAL EXAMINATION:  Blood pressure 108/62, pulse 91, temperature 98.4 F (36.9 C), temperature source Oral, resp. rate 16, height 4\' 11"  (1.499 m), weight 129 lb 12.8 oz (58.9 kg), SpO2 99 %.  ECOG PERFORMANCE STATUS: 1 - Symptomatic but completely ambulatory  Physical Exam  Constitutional: Oriented to person, place, and time and well-developed, well-nourished, and in no distress. No distress.  HENT:  Head: Normocephalic and atraumatic.  Mouth/Throat: Oropharynx is clear and moist. No oropharyngeal exudate.  Eyes: Conjunctivae are normal. Right eye exhibits no discharge. Left eye exhibits no discharge. No scleral icterus.  Neck: Normal range of motion. Neck supple.  Cardiovascular: Normal rate, regular rhythm, normal heart sounds and  intact distal pulses.   Pulmonary/Chest: Effort normal and breath sounds normal. No respiratory distress. No wheezes. No rales.  Abdominal: Soft. Bowel sounds are normal. Exhibits no distension and no mass. There is no tenderness.  Musculoskeletal: Normal range of motion. Exhibits no edema.  Lymphadenopathy:    No cervical adenopathy.  Neurological: Alert and oriented to person, place, and time. Exhibits normal muscle tone. Gait normal. Coordination normal.  Skin: Skin is warm and dry. No rash noted. Not diaphoretic. No erythema. No pallor.  Psychiatric: Mood, memory and judgment normal.  Vitals reviewed.  LABORATORY DATA: Lab Results  Component Value Date   WBC 6.6 02/23/2018   HGB 13.0 02/23/2018   HCT 38.8 02/23/2018   MCV 93.7 02/23/2018   PLT 222 02/23/2018      Chemistry      Component Value Date/Time   NA 141 02/23/2018 1427   K 4.0 02/23/2018 1427   CL 108 02/23/2018 1427   CO2 24 02/23/2018 1427   BUN 13 02/23/2018 1427   CREATININE 0.93 02/23/2018 1427      Component Value Date/Time   CALCIUM 10.0 02/23/2018 1427   ALKPHOS 103 02/23/2018 1427   AST 18 02/23/2018 1427   ALT 16 02/23/2018 1427   BILITOT 0.3 02/23/2018 1427       RADIOGRAPHIC STUDIES:  No results found.   ASSESSMENT/PLAN:  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 [facility]) This is a very pleasant 72 year old white female with suspicious for stage IIIA non-small cell lung cancer, adenocarcinoma presented mainly with right lower lobe lung mass in addition to subcarinal lymphadenopathy but there was suspicious metastatic pulmonary nodules. The patient completed a course of concurrent chemoradiation with weekly carboplatin and paclitaxel.  She tolerated her treatment well with no concerning complaints. The patient is currently undergoing consolidation treatment with immunotherapy with Imfinzi (Durvalumab) status post 11 cycles. The patient continues to tolerate her treatment well. I  recommended for her to proceed with cycle #12 today as scheduled. She will have a restaging CT scan of the chest prior to her next visit.  She will follow-up in 2 weeks for evaluation prior to cycle #13 and to review her restaging CT scan results.  She was advised to call immediately if she has any concerning symptoms in the interval. All questions were answered. The patient knows to call the clinic with any problems, questions or concerns. We can certainly see the patient much sooner if necessary.   Orders Placed This Encounter  Procedures  . CT CHEST W CONTRAST    Standing Status:   Future    Standing Expiration Date:   02/24/2019    Order Specific Question:   If indicated for the ordered procedure, I authorize the administration of contrast media per Radiology protocol    Answer:   Yes    Order Specific Question:   Preferred imaging location?    Answer:   [facility]    Order Specific Question:   Radiology Contrast Protocol - do NOT remove file path    Answer:   [email or web address]    Order Specific Question:   ** REASON FOR EXAM (FREE TEXT)    Answer:   Lung cancer. Restaging.     Mikey Bussing, DNP, AGPCNP-BC, AOCNP 02/23/18

## 2018-02-24 ENCOUNTER — Encounter: Payer: Self-pay | Admitting: Family Medicine

## 2018-02-24 ENCOUNTER — Ambulatory Visit (INDEPENDENT_AMBULATORY_CARE_PROVIDER_SITE_OTHER): Payer: PPO | Admitting: Family Medicine

## 2018-02-24 VITALS — BP 100/60 | HR 85 | Temp 97.9°F | Ht 59.5 in | Wt 130.5 lb

## 2018-02-24 DIAGNOSIS — E119 Type 2 diabetes mellitus without complications: Secondary | ICD-10-CM | POA: Diagnosis not present

## 2018-02-24 DIAGNOSIS — Z23 Encounter for immunization: Secondary | ICD-10-CM

## 2018-02-24 DIAGNOSIS — E7800 Pure hypercholesterolemia, unspecified: Secondary | ICD-10-CM | POA: Diagnosis not present

## 2018-02-24 DIAGNOSIS — C3431 Malignant neoplasm of lower lobe, right bronchus or lung: Secondary | ICD-10-CM

## 2018-02-24 DIAGNOSIS — R809 Proteinuria, unspecified: Secondary | ICD-10-CM

## 2018-02-24 DIAGNOSIS — G4701 Insomnia due to medical condition: Secondary | ICD-10-CM

## 2018-02-24 LAB — HM DIABETES FOOT EXAM

## 2018-02-24 MED ORDER — TRAZODONE HCL 50 MG PO TABS: 25.0000 mg | ORAL_TABLET | Freq: Every evening | ORAL | 3 refills | Status: DC | PRN

## 2018-02-24 NOTE — Assessment & Plan Note (Addendum)
Now improved control on melatonin.  Off and on issues.. She would like a med to use on trouble days. Can try a trial of trazodone.

## 2018-02-24 NOTE — Progress Notes (Signed)
Subjective:    Patient ID: Katelyn Kennedy, female    DOB: 1945-10-03, 72 y.o.   MRN: [id number]  HPI  72 year old female presents for DM follow up.  In last year she has had chemo and radiation for lung cancer.  ONC is Dr. Ivin Poot. Has quit smoking.  Wt Readings from Last 3 Encounters:  02/24/18 130 lb 8 oz (59.2 kg)  02/23/18 129 lb 12.8 oz (58.9 kg)  02/10/18 127 lb 4.8 oz (57.7 kg)  Body mass index is 25.92 kg/m.  Diabetes:   Good control  with diet. Lab Results  Component Value Date   HGBA1C 5.6 02/17/2018  Using medications without difficulties: Hypoglycemic episodes:none Hyperglycemic episodes:none Feet problems:no ulcers Blood Sugars averaging: not checking eye exam within last year: 01/11/2018  Elevated Cholesterol:  At goal on lipitor Lab Results  Component Value Date   CHOL 166 02/17/2018   HDL 61.90 02/17/2018   LDLCALC 81 02/17/2018   LDLDIRECT 118.7 01/24/2013   TRIG 114.0 02/17/2018   CHOLHDL 3 02/17/2018  Using medications without problems:none Muscle aches: none Diet compliance: Better now on infusion. Exercise: walking  daily Other complaints:   BP well controlled on lisinopril for microalbumin.  Social History /Family History/Past Medical History reviewed in detail and updated in EMR if needed. Blood pressure 100/60, pulse 85, temperature 97.9 F (36.6 C), temperature source Oral, height 4' 11.5" (1.511 m), weight 130 lb 8 oz (59.2 kg).  Review of Systems  Constitutional: Negative for fatigue and fever.  HENT: Negative for congestion.   Eyes: Negative for pain.  Respiratory: Negative for cough and shortness of breath.   Cardiovascular: Negative for chest pain, palpitations and leg swelling.  Gastrointestinal: Negative for abdominal pain.  Genitourinary: Negative for dysuria and vaginal bleeding.  Musculoskeletal: Negative for back pain.  Neurological: Negative for syncope, light-headedness and headaches.  Psychiatric/Behavioral:  Negative for dysphoric mood.       Objective:   Physical Exam  Constitutional: Vital signs are normal. She appears well-developed and well-nourished. She is cooperative.  Non-toxic appearance. She does not appear ill. No distress.  HENT:  Head: Normocephalic.  Right Ear: Hearing, tympanic membrane, external ear and ear canal normal. Tympanic membrane is not erythematous, not retracted and not bulging.  Left Ear: Hearing, tympanic membrane, external ear and ear canal normal. Tympanic membrane is not erythematous, not retracted and not bulging.  Nose: No mucosal edema or rhinorrhea. Right sinus exhibits no maxillary sinus tenderness and no frontal sinus tenderness. Left sinus exhibits no maxillary sinus tenderness and no frontal sinus tenderness.  Mouth/Throat: Uvula is midline, oropharynx is clear and moist and mucous membranes are normal.  Eyes: Pupils are equal, round, and reactive to light. Conjunctivae, EOM and lids are normal. Lids are everted and swept, no foreign bodies found.  Neck: Trachea normal and normal range of motion. Neck supple. Carotid bruit is not present. No thyroid mass and no thyromegaly present.  Cardiovascular: Normal rate, regular rhythm, S1 normal, S2 normal, normal heart sounds, intact distal pulses and normal pulses. Exam reveals no gallop and no friction rub.  No murmur heard. Pulmonary/Chest: Effort normal and breath sounds normal. No tachypnea. No respiratory distress. She has no decreased breath sounds. She has no wheezes. She has no rhonchi. She has no rales.  Abdominal: Soft. Normal appearance and bowel sounds are normal. There is no tenderness.  Neurological: She is alert.  Skin: Skin is warm, dry and intact. No rash noted.  Psychiatric: Her speech  is normal and behavior is normal. Judgment and thought content normal. Her mood appears not anxious. Cognition and memory are normal. She does not exhibit a depressed mood.   Diabetic foot exam: Normal  inspection No skin breakdown No calluses  Normal DP pulses Normal sensation to light touch and monofilament Nails normal         Assessment & Plan:

## 2018-02-24 NOTE — Assessment & Plan Note (Signed)
On low dose of lisinopril.

## 2018-02-24 NOTE — Assessment & Plan Note (Signed)
Well controlled. Continue current medication.  

## 2018-02-24 NOTE — Assessment & Plan Note (Signed)
Followed by oncologist.

## 2018-02-24 NOTE — Assessment & Plan Note (Signed)
Diet controlled Encouraged exercise, weight maintenance, healthy eating habits.

## 2018-03-07 ENCOUNTER — Ambulatory Visit (HOSPITAL_COMMUNITY)
Admission: RE | Admit: 2018-03-07 | Discharge: 2018-03-07 | Disposition: A | Discharge status: Home / Self Care | Payer: PPO | Source: Ambulatory Visit | Attending: Oncology | Admitting: Oncology

## 2018-03-07 DIAGNOSIS — C3431 Malignant neoplasm of lower lobe, right bronchus or lung: Secondary | ICD-10-CM | POA: Diagnosis not present

## 2018-03-07 DIAGNOSIS — C3490 Malignant neoplasm of unspecified part of unspecified bronchus or lung: Secondary | ICD-10-CM | POA: Diagnosis not present

## 2018-03-07 MED ORDER — IOHEXOL 300 MG/ML  SOLN
75.0000 mL | Freq: Once | INTRAMUSCULAR | Status: AC | PRN
  Administered 2018-03-07: 75 mL via INTRAVENOUS

## 2018-03-07 MED ORDER — SODIUM CHLORIDE (PF) 0.9 % IJ SOLN
INTRAMUSCULAR | Status: AC
  Filled 2018-03-07: qty 50

## 2018-03-08 NOTE — Progress Notes (Signed)
[facility]atelyn [facility] OFFICE PROGRESS NOTE  Katelyn Sanders, MD [place] [address]  DIAGNOSIS:Stage IIIA (T2a,N2, M0) non-small cell lung cancer,adenocarcinomapresenting with right lower lobe lung mass in addition to subcarinal lymphadenopathy and suspicious metastatic pulmonary nodules diagnosed in February 2019.  PRIOR THERAPY: Concurrent chemoradiation with carboplatin for an AUC of 2 with paclitaxel 45 mg/m weekly. First dosegiven on3/18/2019. Status post 7 cycles. Last dose was giving August 09, 2017 with partial response.  CURRENT THERAPY: Consolidation immunotherapy with Imfinzi (Durvalumab) 10 mg/KG every 2 weeks, first dose 09/23/2017. Status post 12cycles.  INTERVAL HISTORY: Katelyn Kennedy 72 y.o. female returns for routine follow-up visit by herself.  The patient is feeling fine today and has no specific complaints except for feeling tired.  She did not sleep well last night.  Denies fevers and chills.  Denies chest pain, shortness of breath, hemoptysis.  Reports intermittent cough.  Denies nausea, vomiting, constipation, diarrhea.  Denies recent weight loss or night sweats.  The patient is here for evaluation prior to cycle #13 of Imfinzi and to review her restaging CT scan results.  MEDICAL HISTORY: Past Medical History:  Diagnosis Date  . Allergic rhinitis   . Arthritis   . Diabetes mellitus without complication (HCC)    diet control/no meds per pt  . Dyspnea   . History of chicken pox   . Smoker     ALLERGIES:  is allergic to codeine.  MEDICATIONS:  Current Outpatient Medications  Medication Sig Dispense Refill  . acetaminophen (TYLENOL) 500 MG tablet Take 500 mg by mouth daily as needed for moderate pain or headache.    Marland Kitchen atorvastatin (LIPITOR) 20 MG tablet TAKE 1 TABLET BY MOUTH EVERY DAY 90 tablet 0  . Calcium Carbonate-Vitamin D (CALCIUM + D) 600-200 MG-UNIT per tablet Take 1 tablet by mouth daily.     .  diphenhydramine-acetaminophen (TYLENOL PM) 25-500 MG TABS tablet Take 2 tablets by mouth at bedtime as needed.    . fluticasone (FLONASE) 50 MCG/ACT nasal spray PLACE 2 SPRAYS INTO THE NOSE DAILY. 16 g 5  . lisinopril (PRINIVIL,ZESTRIL) 5 MG tablet TAKE 1/2 TABLET BY MOUTH ONCE DAILY 45 tablet 0  . Multiple Vitamin (MULTIVITAMIN) capsule Take 1 capsule by mouth daily.      Katelyn Kennedy Glycol-Propyl Glycol (SYSTANE) 0.4-0.3 % SOLN Apply to eye.    . prochlorperazine (COMPAZINE) 10 MG tablet Take 1 tablet (10 mg total) by mouth every 6 (six) hours as needed for nausea or vomiting. 30 tablet 0  . traMADol (ULTRAM) 50 MG tablet Take 1 tablet (50 mg total) by mouth every 6 (six) hours as needed for moderate pain. 15 tablet 0  . traZODone (DESYREL) 50 MG tablet Take 0.5-1 tablets (25-50 mg total) by mouth at bedtime as needed for sleep. 30 tablet 3   No current facility-administered medications for this visit.     SURGICAL HISTORY:  Past Surgical History:  Procedure Laterality Date  . ANKLE FRACTURE SURGERY Left 10 years ago   "hard to wake up from anesthesia" per pt  . [place]  . BREAST BIOPSY Right 1978   BENIGN CYST  . BREAST EXCISIONAL BIOPSY Left 2011   Benign  . BREAST EXCISIONAL BIOPSY Right 1985   Benign   . CATARACT EXTRACTION    . CATARACT EXTRACTION W/ INTRAOCULAR LENS IMPLANT Bilateral   . COLONOSCOPY  2011  . ELECTROMAGNETIC NAVIGATION BROCHOSCOPY N/A 06/08/2017   Procedure: ELECTROMAGNETIC NAVIGATION  BRONCHOSCOPY;  Surgeon: Flora Lipps, MD;  Location: [facility] ORS;  Service: Cardiopulmonary;  Laterality: N/A;  . POLYPECTOMY  2011  . 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 TUBAL LIGATION      REVIEW OF SYSTEMS:   Review of Systems  Constitutional: Negative for  appetite change, chills, fatigue, fever and unexpected weight change.  HENT:   Negative for mouth sores, nosebleeds, sore throat and trouble swallowing.   Eyes: Negative for eye problems and icterus.  Respiratory: Negative for hemoptysis, shortness of breath and wheezing.  Positive for intermittent nonproductive cough. Cardiovascular: Negative for chest pain and leg swelling.  Gastrointestinal: Negative for abdominal pain, constipation, diarrhea, nausea and vomiting.  Genitourinary: Negative for bladder incontinence, difficulty urinating, dysuria, frequency and hematuria.   Musculoskeletal: Negative for back pain, gait problem, neck pain and neck stiffness.  Skin: Negative for itching and rash.  Neurological: Negative for dizziness, extremity weakness, gait problem, headaches, light-headedness and seizures.  Hematological: Negative for adenopathy. Does not bruise/bleed easily.  Psychiatric/Behavioral: Negative for confusion, depression and sleep disturbance. The patient is not nervous/anxious.     PHYSICAL EXAMINATION:  Blood pressure 97/60, pulse 93, temperature 98.4 F (36.9 C), temperature source Oral, resp. rate 18, height 4' 11.5" (1.511 m), weight 129 lb (58.5 kg), SpO2 97 %.  ECOG PERFORMANCE STATUS: 1 - Symptomatic but completely ambulatory  Physical Exam  Constitutional: Oriented to person, place, and time and well-developed, well-nourished, and in no distress. No distress.  HENT:  Head: Normocephalic and atraumatic.  Mouth/Throat: Oropharynx is clear and moist. No oropharyngeal exudate.  Eyes: Conjunctivae are normal. Right eye exhibits no discharge. Left eye exhibits no discharge. No scleral icterus.  Neck: Normal range of motion. Neck supple.  Cardiovascular: Normal rate, regular rhythm, normal heart sounds and intact distal pulses.   Pulmonary/Chest: Effort normal and breath sounds normal. No respiratory distress. No wheezes. No rales.  Abdominal: Soft. Bowel sounds are  normal. Exhibits no distension and no mass. There is no tenderness.  Musculoskeletal: Normal range of motion. Exhibits no edema.  Lymphadenopathy:    No cervical adenopathy.  Neurological: Alert and oriented to person, place, and time. Exhibits normal muscle tone. Gait normal. Coordination normal.  Skin: Skin is warm and dry. No rash noted. Not diaphoretic. No erythema. No pallor.  Psychiatric: Mood, memory and judgment normal.  Vitals reviewed.  LABORATORY DATA: Lab Results  Component Value Date   WBC 5.7 03/09/2018   HGB 13.1 03/09/2018   HCT 39.1 03/09/2018   MCV 93.8 03/09/2018   PLT 246 03/09/2018      Chemistry      Component Value Date/Time   NA 142 03/09/2018 0915   K 3.9 03/09/2018 0915   CL 109 03/09/2018 0915   CO2 22 03/09/2018 0915   BUN 14 03/09/2018 0915   CREATININE 0.85 03/09/2018 0915      Component Value Date/Time   CALCIUM 9.9 03/09/2018 0915   ALKPHOS 103 03/09/2018 0915   AST 17 03/09/2018 0915   ALT 17 03/09/2018 0915   BILITOT 0.4 03/09/2018 0915       RADIOGRAPHIC STUDIES:  Ct Chest W Contrast  Result Date: 03/08/2018 CLINICAL DATA:  Non-small-cell lung cancer. EXAM: CT CHEST WITH CONTRAST TECHNIQUE: Multidetector CT imaging of the chest was performed during intravenous contrast administration. CONTRAST:  34mL OMNIPAQUE  IOHEXOL 300 MG/ML  SOLN COMPARISON:  12/14/2017. FINDINGS: Cardiovascular: The heart size is normal. No substantial pericardial effusion. Coronary artery calcification is evident. Atherosclerotic calcification is noted in the wall of the thoracic aorta. Mediastinum/Nodes: No mediastinal lymphadenopathy. No left hilar lymphadenopathy. Soft tissue in the right hilum is stable and compatible with treatment related change. Tiny hiatal hernia. The esophagus has normal imaging features. Stable 14 mm left thyroid nodule with additional nodules identified in both thyroid lobes. There is no axillary lymphadenopathy. Lungs/Pleura: The central  tracheobronchial airways are patent. Centrilobular and paraseptal emphysema again noted. There is a new 9 x 12 mm right upper lobe nodule (24/5). Bandlike interstitial and airspace disease in the right parahilar lung is similar to prior and compatible with evolving radiation fibrosis. 7 mm right lower lobe nodule identified previously is stable (93/5). No pleural effusion. Upper Abdomen: The small hypoattenuating lesion in the posterior liver identified previously is unchanged (112/2). Tiny hypervascular focus in the anterior left liver (110/2) not definitely seen previously. Musculoskeletal: No worrisome lytic or sclerotic osseous abnormality. IMPRESSION: 1. Interval development of a new 9 x 12 mm right upper lobe pulmonary nodule. Potentially infectious/inflammatory, but metastatic disease not excluded. 2. Stable 7 mm right lower lobe pulmonary nodule. 3. Similar appearance of evolving radiation fibrosis in the parahilar right lung. 4.  Emphysema. (ICD10-J43.9) 5.  Aortic Atherosclerois (ICD10-170.0) Electronically Signed   By: Misty Stanley M.D.   On: 03/08/2018 08:47     ASSESSMENT/PLAN:  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 [facility]) This is a very pleasant 72 year old white female with suspicious for stage IIIA non-small cell lung cancer, adenocarcinoma presented mainly with right lower lobe lung mass in addition to subcarinal lymphadenopathy but there was suspicious metastatic pulmonary nodules. The patient completed a course of concurrent chemoradiation with weekly carboplatin and paclitaxel. She tolerated her treatment well with no concerning complaints. The patient is currently undergoing consolidation treatment with immunotherapy with Imfinzi (Durvalumab) status post12cycles. The patient continues to tolerate her treatment well. She had a restaging CT scan of the chest and is here to discuss the results.  The patient was seen with Dr. Julien Nordmann.  CT scan results were discussed  with the patient which showed overall stable disease.  We discussed that there is a small area in the right upper lobe which could be infectious.  Recommend that she continue Imfinzi we will watch this area closely on her upcoming scans.  She will proceed with cycle #13 today as scheduled.  The patient will follow-up in 2 weeks for evaluation prior to cycle #14.  She was advised to call immediately if she has any concerning symptoms in the interval. All questions were answered. The patient knows to call the clinic with any problems, questions or concerns. We can certainly see the patient much sooner if necessary.   No orders of the defined types were placed in this encounter.    Mikey Bussing, DNP, AGPCNP-BC, AOCNP 03/09/18   ADDENDUM: Hematology/Oncology Attending: I had a face-to-face encounter with the patient.  I recommended her care plan.  This is a very pleasant 72 years old white female with a stage IIIa non-small cell lung cancer, adenocarcinoma status post induction concurrent chemoradiation with weekly carboplatin and paclitaxel with partial response.  The patient is currently on treatment with consolidation immunotherapy with Imfinzi status post 12 cycles. She has been tolerating this treatment well with no concerning adverse effects. She had repeat CT scan of the chest performed  recently.  I personally and independently reviewed the scans and discussed the results with the patient today.  Her scan showed stable disease except for small lesion in the right upper lobe suspicious to be inflammatory in origin.  The patient is currently asymptomatic. I recommended for her to continue her current treatment with Imfinzi and she will proceed with cycle #13 today. We will continue to monitor this inflammatory process in her lung closely to rule out any immunotherapy mediated pneumonitis if the patient becomes symptomatic. She will come back for follow-up visit in 2 weeks for evaluation before  starting the next cycle of her treatment. The patient was advised to call immediately if she has any concerning symptoms in the interval.  Disclaimer: This note was dictated with voice recognition software. Similar sounding words can inadvertently be transcribed and may be missed upon review. Eilleen Kempf, MD 03/12/18

## 2018-03-08 NOTE — Assessment & Plan Note (Addendum)
This is a very pleasant 72 year old white female with suspicious for stage IIIA non-small cell lung cancer, adenocarcinoma presented mainly with right lower lobe lung mass in addition to subcarinal lymphadenopathy but there was suspicious metastatic pulmonary nodules. The patient completed a course of concurrent chemoradiation with weekly carboplatin and paclitaxel. She tolerated her treatment well with no concerning complaints. The patient is currently undergoing consolidation treatment with immunotherapy with Imfinzi (Durvalumab) status post12cycles. The patient continues to tolerate her treatment well. She had a restaging CT scan of the chest and is here to discuss the results.  The patient was seen with Dr. Julien Nordmann.  CT scan results were discussed with the patient which showed overall stable disease.  We discussed that there is a small area in the right upper lobe which could be infectious.  Recommend that she continue Imfinzi we will watch this area closely on her upcoming scans.  She will proceed with cycle #13 today as scheduled.  The patient will follow-up in 2 weeks for evaluation prior to cycle #14.  She was advised to call immediately if she has any concerning symptoms in the interval. All questions were answered. The patient knows to call the clinic with any problems, questions or concerns. We can certainly see the patient much sooner if necessary.

## 2018-03-09 ENCOUNTER — Encounter: Payer: Self-pay | Admitting: Oncology

## 2018-03-09 ENCOUNTER — Inpatient Hospital Stay: Payer: PPO

## 2018-03-09 ENCOUNTER — Inpatient Hospital Stay (HOSPITAL_BASED_OUTPATIENT_CLINIC_OR_DEPARTMENT_OTHER): Payer: PPO | Admitting: Oncology

## 2018-03-09 VITALS — BP 97/60 | HR 93 | Temp 98.4°F | Resp 18 | Ht 59.5 in | Wt 129.0 lb

## 2018-03-09 DIAGNOSIS — Z79899 Other long term (current) drug therapy: Secondary | ICD-10-CM | POA: Diagnosis not present

## 2018-03-09 DIAGNOSIS — Z5112 Encounter for antineoplastic immunotherapy: Secondary | ICD-10-CM

## 2018-03-09 DIAGNOSIS — Z923 Personal history of irradiation: Secondary | ICD-10-CM

## 2018-03-09 DIAGNOSIS — M129 Arthropathy, unspecified: Secondary | ICD-10-CM | POA: Diagnosis not present

## 2018-03-09 DIAGNOSIS — C3431 Malignant neoplasm of lower lobe, right bronchus or lung: Secondary | ICD-10-CM

## 2018-03-09 DIAGNOSIS — R599 Enlarged lymph nodes, unspecified: Secondary | ICD-10-CM

## 2018-03-09 DIAGNOSIS — R918 Other nonspecific abnormal finding of lung field: Secondary | ICD-10-CM | POA: Diagnosis not present

## 2018-03-09 DIAGNOSIS — Z9221 Personal history of antineoplastic chemotherapy: Secondary | ICD-10-CM | POA: Diagnosis not present

## 2018-03-09 DIAGNOSIS — E119 Type 2 diabetes mellitus without complications: Secondary | ICD-10-CM | POA: Diagnosis not present

## 2018-03-09 LAB — CBC WITH DIFFERENTIAL (CANCER CENTER ONLY)
Abs Immature Granulocytes: 0.02 10*3/uL (ref 0.00–0.07)
Basophils Absolute: 0 10*3/uL (ref 0.0–0.1)
Basophils Relative: 1 %
Eosinophils Absolute: 0.2 10*3/uL (ref 0.0–0.5)
Eosinophils Relative: 3 %
HCT: 39.1 % (ref 36.0–46.0)
Hemoglobin: 13.1 g/dL (ref 12.0–15.0)
Immature Granulocytes: 0 %
Lymphocytes Relative: 14 %
Lymphs Abs: 0.8 10*3/uL (ref 0.7–4.0)
MCH: 31.4 pg (ref 26.0–34.0)
MCHC: 33.5 g/dL (ref 30.0–36.0)
MCV: 93.8 fL (ref 80.0–100.0)
Monocytes Absolute: 0.6 10*3/uL (ref 0.1–1.0)
Monocytes Relative: 10 %
Neutro Abs: 4.1 10*3/uL (ref 1.7–7.7)
Neutrophils Relative %: 72 %
Platelet Count: 246 10*3/uL (ref 150–400)
RBC: 4.17 MIL/uL (ref 3.87–5.11)
RDW: 12.6 % (ref 11.5–15.5)
WBC Count: 5.7 10*3/uL (ref 4.0–10.5)
nRBC: 0 % (ref 0.0–0.2)

## 2018-03-09 LAB — CMP (CANCER CENTER ONLY)
ALT: 17 U/L (ref 0–44)
AST: 17 U/L (ref 15–41)
Albumin: 3.7 g/dL (ref 3.5–5.0)
Alkaline Phosphatase: 103 U/L (ref 38–126)
Anion gap: 11 (ref 5–15)
BUN: 14 mg/dL (ref 8–23)
CO2: 22 mmol/L (ref 22–32)
Calcium: 9.9 mg/dL (ref 8.9–10.3)
Chloride: 109 mmol/L (ref 98–111)
Creatinine: 0.85 mg/dL (ref 0.44–1.00)
GFR, Est AFR Am: >60 mL/min (ref >60)
GFR, Estimated: >60 mL/min (ref >60)
Glucose, Bld: 121 mg/dL — ABNORMAL HIGH (ref 70–99)
Potassium: 3.9 mmol/L (ref 3.5–5.1)
Sodium: 142 mmol/L (ref 135–145)
Total Bilirubin: 0.4 mg/dL (ref 0.3–1.2)
Total Protein: 7.1 g/dL (ref 6.5–8.1)

## 2018-03-09 LAB — TSH: TSH: 2.776 u[IU]/mL (ref 0.308–3.960)

## 2018-03-09 MED ORDER — SODIUM CHLORIDE 0.9 % IV SOLN
Freq: Once | INTRAVENOUS | Status: AC
  Administered 2018-03-09: 11:00:00 via INTRAVENOUS
  Filled 2018-03-09: qty 250

## 2018-03-09 MED ORDER — SODIUM CHLORIDE 0.9 % IV SOLN
620.0000 mg | Freq: Once | INTRAVENOUS | Status: AC
  Administered 2018-03-09: 620 mg via INTRAVENOUS
  Filled 2018-03-09: qty 10

## 2018-03-09 NOTE — Patient Instructions (Signed)
[facility] Discharge Instructions for Patients Receiving Chemotherapy  Today you received the following chemotherapy agents: Imfinzi.  To help prevent nausea and vomiting after your treatment, we encourage you to take your nausea medication as directed.   If you develop nausea and vomiting that is not controlled by your nausea medication, call the clinic.   BELOW ARE SYMPTOMS THAT SHOULD BE REPORTED IMMEDIATELY:  *FEVER GREATER THAN 100.5 F  *CHILLS WITH OR WITHOUT FEVER  NAUSEA AND VOMITING THAT IS NOT CONTROLLED WITH YOUR NAUSEA MEDICATION  *UNUSUAL SHORTNESS OF BREATH  *UNUSUAL BRUISING OR BLEEDING  TENDERNESS IN MOUTH AND THROAT WITH OR WITHOUT PRESENCE OF ULCERS  *URINARY PROBLEMS  *BOWEL PROBLEMS  UNUSUAL RASH Items with * indicate a potential emergency and should be followed up as soon as possible.  Feel free to call the clinic should you have any questions or concerns. The clinic phone number is [phone].  Please show the CHEMO ALERT CARD at check-in to the Emergency Department and triage nurse.   

## 2018-03-11 ENCOUNTER — Other Ambulatory Visit: Payer: PPO

## 2018-03-11 ENCOUNTER — Ambulatory Visit: Payer: PPO

## 2018-03-11 ENCOUNTER — Ambulatory Visit: Payer: PPO | Admitting: Oncology

## 2018-03-14 ENCOUNTER — Telehealth: Payer: Self-pay | Admitting: Oncology

## 2018-03-14 NOTE — Telephone Encounter (Signed)
Scheduled appt per 11/27 los - pt to get an updated schedule next visit.

## 2018-03-17 ENCOUNTER — Other Ambulatory Visit: Payer: Self-pay | Admitting: *Deleted

## 2018-03-17 MED ORDER — ATORVASTATIN CALCIUM 20 MG PO TABS: 20.0000 mg | ORAL_TABLET | Freq: Every day | ORAL | 3 refills | Status: DC

## 2018-03-24 ENCOUNTER — Inpatient Hospital Stay (HOSPITAL_BASED_OUTPATIENT_CLINIC_OR_DEPARTMENT_OTHER): Payer: PPO | Admitting: Internal Medicine

## 2018-03-24 ENCOUNTER — Inpatient Hospital Stay: Payer: PPO | Attending: Internal Medicine

## 2018-03-24 ENCOUNTER — Telehealth: Payer: Self-pay | Admitting: Internal Medicine

## 2018-03-24 ENCOUNTER — Inpatient Hospital Stay: Payer: PPO

## 2018-03-24 ENCOUNTER — Encounter: Payer: Self-pay | Admitting: Internal Medicine

## 2018-03-24 VITALS — BP 113/65 | HR 92 | Temp 98.6°F | Resp 18 | Ht 59.0 in | Wt 129.5 lb

## 2018-03-24 DIAGNOSIS — M129 Arthropathy, unspecified: Secondary | ICD-10-CM

## 2018-03-24 DIAGNOSIS — Z9221 Personal history of antineoplastic chemotherapy: Secondary | ICD-10-CM

## 2018-03-24 DIAGNOSIS — E041 Nontoxic single thyroid nodule: Secondary | ICD-10-CM

## 2018-03-24 DIAGNOSIS — Z5112 Encounter for antineoplastic immunotherapy: Secondary | ICD-10-CM | POA: Diagnosis not present

## 2018-03-24 DIAGNOSIS — K449 Diaphragmatic hernia without obstruction or gangrene: Secondary | ICD-10-CM | POA: Insufficient documentation

## 2018-03-24 DIAGNOSIS — C3431 Malignant neoplasm of lower lobe, right bronchus or lung: Secondary | ICD-10-CM

## 2018-03-24 DIAGNOSIS — Z79899 Other long term (current) drug therapy: Secondary | ICD-10-CM | POA: Diagnosis not present

## 2018-03-24 DIAGNOSIS — J439 Emphysema, unspecified: Secondary | ICD-10-CM | POA: Diagnosis not present

## 2018-03-24 DIAGNOSIS — E119 Type 2 diabetes mellitus without complications: Secondary | ICD-10-CM | POA: Diagnosis not present

## 2018-03-24 DIAGNOSIS — Z923 Personal history of irradiation: Secondary | ICD-10-CM | POA: Insufficient documentation

## 2018-03-24 LAB — CBC WITH DIFFERENTIAL (CANCER CENTER ONLY)
Abs Immature Granulocytes: 0.01 10*3/uL (ref 0.00–0.07)
Basophils Absolute: 0 10*3/uL (ref 0.0–0.1)
Basophils Relative: 1 %
Eosinophils Absolute: 0.1 10*3/uL (ref 0.0–0.5)
Eosinophils Relative: 1 %
HCT: 39.4 % (ref 36.0–46.0)
Hemoglobin: 13.4 g/dL (ref 12.0–15.0)
Immature Granulocytes: 0 %
Lymphocytes Relative: 16 %
Lymphs Abs: 1 10*3/uL (ref 0.7–4.0)
MCH: 31.8 pg (ref 26.0–34.0)
MCHC: 34 g/dL (ref 30.0–36.0)
MCV: 93.4 fL (ref 80.0–100.0)
Monocytes Absolute: 0.6 10*3/uL (ref 0.1–1.0)
Monocytes Relative: 10 %
Neutro Abs: 4.9 10*3/uL (ref 1.7–7.7)
Neutrophils Relative %: 72 %
Platelet Count: 229 10*3/uL (ref 150–400)
RBC: 4.22 MIL/uL (ref 3.87–5.11)
RDW: 12.4 % (ref 11.5–15.5)
WBC Count: 6.7 10*3/uL (ref 4.0–10.5)
nRBC: 0 % (ref 0.0–0.2)

## 2018-03-24 LAB — CMP (CANCER CENTER ONLY)
ALT: 16 U/L (ref 0–44)
AST: 19 U/L (ref 15–41)
Albumin: 3.9 g/dL (ref 3.5–5.0)
Alkaline Phosphatase: 107 U/L (ref 38–126)
Anion gap: 12 (ref 5–15)
BUN: 12 mg/dL (ref 8–23)
CO2: 23 mmol/L (ref 22–32)
Calcium: 10.2 mg/dL (ref 8.9–10.3)
Chloride: 106 mmol/L (ref 98–111)
Creatinine: 0.83 mg/dL (ref 0.44–1.00)
GFR, Est AFR Am: >60 mL/min (ref >60)
GFR, Estimated: >60 mL/min (ref >60)
Glucose, Bld: 103 mg/dL — ABNORMAL HIGH (ref 70–99)
Potassium: 4.4 mmol/L (ref 3.5–5.1)
Sodium: 141 mmol/L (ref 135–145)
Total Bilirubin: 0.4 mg/dL (ref 0.3–1.2)
Total Protein: 7.4 g/dL (ref 6.5–8.1)

## 2018-03-24 MED ORDER — SODIUM CHLORIDE 0.9 % IV SOLN
620.0000 mg | Freq: Once | INTRAVENOUS | Status: AC
  Administered 2018-03-24: 620 mg via INTRAVENOUS
  Filled 2018-03-24: qty 10

## 2018-03-24 MED ORDER — SODIUM CHLORIDE 0.9 % IV SOLN
Freq: Once | INTRAVENOUS | Status: AC
  Administered 2018-03-24: 14:00:00 via INTRAVENOUS
  Filled 2018-03-24: qty 250

## 2018-03-24 NOTE — Progress Notes (Signed)
[facility] [place] [phone]   [phone]  OFFICE PROGRESS NOTE  [name], MD [place] [address]  DIAGNOSIS: Stage IIIA (T2a,N2, M0) non-small cell lung cancer,adenocarcinomapresenting with right lower lobe lung mass in addition to subcarinal lymphadenopathy and suspicious metastatic pulmonary nodules diagnosed in February 2019.  PRIOR THERAPY: Concurrent chemoradiation with carboplatin for an AUC of 2 with paclitaxel 45 mg/m weekly. First dose given on 06/28/2017.  Status post 7 cycles.  Last dose was giving August 09, 2017 with partial response.  CURRENT THERAPY: Consolidation immunotherapy with Imfinzi (Durvalumab) 10 mg/KG every 2 weeks, first dose 09/23/2017.  Status post 13 cycles.  INTERVAL HISTORY: Katelyn Kennedy 72 y.o. female returns to the clinic today for follow-up visit.  The patient is feeling fine today with no concerning complaints except for mild chest congestion.  She denied having any chest pain, shortness of breath, cough or hemoptysis.  She has no nausea, vomiting, diarrhea or constipation.  The patient denied having any weight loss or night sweats.  She continues to tolerate her treatment with Imfinzi fairly well.  She is here for evaluation before starting cycle #14.  MEDICAL HISTORY: Past Medical History:  Diagnosis Date  . Allergic rhinitis   . Arthritis   . Diabetes mellitus without complication (HCC)    diet control/no meds per pt  . Dyspnea   . History of chicken pox   . Smoker     ALLERGIES:  is allergic to codeine.  MEDICATIONS:  Current Outpatient Medications  Medication Sig Dispense Refill  . acetaminophen (TYLENOL) 500 MG tablet Take 500 mg by mouth daily as needed for moderate pain or headache.    Marland Kitchen atorvastatin (LIPITOR) 20 MG tablet Take 1 tablet (20 mg total) by mouth daily. 90 tablet 3  . Calcium Carbonate-Vitamin D (CALCIUM + D) 600-200 MG-UNIT per tablet Take 1 tablet by  mouth daily.     . diphenhydramine-acetaminophen (TYLENOL PM) 25-500 MG TABS tablet Take 2 tablets by mouth at bedtime as needed.    . fluticasone (FLONASE) 50 MCG/ACT nasal spray PLACE 2 SPRAYS INTO THE NOSE DAILY. 16 g 5  . lisinopril (PRINIVIL,ZESTRIL) 5 MG tablet TAKE 1/2 TABLET BY MOUTH ONCE DAILY 45 tablet 0  . Multiple Vitamin (MULTIVITAMIN) capsule Take 1 capsule by mouth daily.      Vladimir Faster Glycol-Propyl Glycol (SYSTANE) 0.4-0.3 % SOLN Apply to eye.    . traZODone (DESYREL) 50 MG tablet Take 0.5-1 tablets (25-50 mg total) by mouth at bedtime as needed for sleep. 30 tablet 3  . prochlorperazine (COMPAZINE) 10 MG tablet Take 1 tablet (10 mg total) by mouth every 6 (six) hours as needed for nausea or vomiting. (Patient not taking: Reported on 03/24/2018) 30 tablet 0  . traMADol (ULTRAM) 50 MG tablet Take 1 tablet (50 mg total) by mouth every 6 (six) hours as needed for moderate pain. (Patient not taking: Reported on 03/24/2018) 15 tablet 0   No current facility-administered medications for this visit.     SURGICAL HISTORY:  Past Surgical History:  Procedure Laterality Date  . ANKLE FRACTURE SURGERY Left 10 years ago   "hard to wake up from anesthesia" per pt  . [place]  . BREAST BIOPSY Right 1978   BENIGN CYST  . BREAST EXCISIONAL BIOPSY Left 2011   Benign  . BREAST EXCISIONAL BIOPSY Right 1985   Benign   . CATARACT EXTRACTION    .  CATARACT EXTRACTION W/ INTRAOCULAR LENS IMPLANT Bilateral   . COLONOSCOPY  2011  . ELECTROMAGNETIC NAVIGATION BROCHOSCOPY N/A 06/08/2017   Procedure: ELECTROMAGNETIC NAVIGATION BRONCHOSCOPY;  Surgeon: Flora Lipps, MD;  Location: [facility] ORS;  Service: Cardiopulmonary;  Laterality: N/A;  . POLYPECTOMY  2011  . TRANSURETHRAL RESECTION OF BLADDER TUMOR WITH MITOMYCIN-C N/A 05/04/2017   Procedure: TRANSURETHRAL RESECTION OF BLADDER TUMOR WITH gemcitabine;  Surgeon: Abbie Sons, MD;  Location: [facility] ORS;  Service: Urology;   Laterality: N/A;  . TUBAL LIGATION      REVIEW OF SYSTEMS:  A comprehensive review of systems was negative.   PHYSICAL EXAMINATION: General appearance: alert, cooperative and no distress Head: Normocephalic, without obvious abnormality, atraumatic Neck: no adenopathy, no JVD, supple, symmetrical, trachea midline and thyroid not enlarged, symmetric, no tenderness/mass/nodules Lymph nodes: Cervical, supraclavicular, and axillary nodes normal. Resp: clear to auscultation bilaterally Back: symmetric, no curvature. ROM normal. No CVA tenderness. Cardio: regular rate and rhythm, S1, S2 normal, no murmur, click, rub or gallop GI: soft, non-tender; bowel sounds normal; no masses,  no organomegaly Extremities: extremities normal, atraumatic, no cyanosis or edema  ECOG PERFORMANCE STATUS: 0 - Asymptomatic  Blood pressure 113/65, pulse 92, temperature 98.6 F (37 C), temperature source Oral, resp. rate 18, height 4\' 11"  (1.499 m), weight 129 lb 8 oz (58.7 kg), SpO2 96 %.  LABORATORY DATA: Lab Results  Component Value Date   WBC 6.7 03/24/2018   HGB 13.4 03/24/2018   HCT 39.4 03/24/2018   MCV 93.4 03/24/2018   PLT 229 03/24/2018      Chemistry      Component Value Date/Time   NA 141 03/24/2018 1113   K 4.4 03/24/2018 1113   CL 106 03/24/2018 1113   CO2 23 03/24/2018 1113   BUN 12 03/24/2018 1113   CREATININE 0.83 03/24/2018 1113      Component Value Date/Time   CALCIUM 10.2 03/24/2018 1113   ALKPHOS 107 03/24/2018 1113   AST 19 03/24/2018 1113   ALT 16 03/24/2018 1113   BILITOT 0.4 03/24/2018 1113       RADIOGRAPHIC STUDIES: Ct Chest W Contrast  Result Date: 03/08/2018 CLINICAL DATA:  Non-small-cell lung cancer. EXAM: CT CHEST WITH CONTRAST TECHNIQUE: Multidetector CT imaging of the chest was performed during intravenous contrast administration. CONTRAST:  43mL OMNIPAQUE IOHEXOL 300 MG/ML  SOLN COMPARISON:  12/14/2017. FINDINGS: Cardiovascular: The heart size is normal. No  substantial pericardial effusion. Coronary artery calcification is evident. Atherosclerotic calcification is noted in the wall of the thoracic aorta. Mediastinum/Nodes: No mediastinal lymphadenopathy. No left hilar lymphadenopathy. Soft tissue in the right hilum is stable and compatible with treatment related change. Tiny hiatal hernia. The esophagus has normal imaging features. Stable 14 mm left thyroid nodule with additional nodules identified in both thyroid lobes. There is no axillary lymphadenopathy. Lungs/Pleura: The central tracheobronchial airways are patent. Centrilobular and paraseptal emphysema again noted. There is a new 9 x 12 mm right upper lobe nodule (24/5). Bandlike interstitial and airspace disease in the right parahilar lung is similar to prior and compatible with evolving radiation fibrosis. 7 mm right lower lobe nodule identified previously is stable (93/5). No pleural effusion. Upper Abdomen: The small hypoattenuating lesion in the posterior liver identified previously is unchanged (112/2). Tiny hypervascular focus in the anterior left liver (110/2) not definitely seen previously. Musculoskeletal: No worrisome lytic or sclerotic osseous abnormality. IMPRESSION: 1. Interval development of a new 9 x 12 mm right upper lobe pulmonary nodule. Potentially infectious/inflammatory, but metastatic disease  not excluded. 2. Stable 7 mm right lower lobe pulmonary nodule. 3. Similar appearance of evolving radiation fibrosis in the parahilar right lung. 4.  Emphysema. (ICD10-J43.9) 5.  Aortic Atherosclerois (ICD10-170.0) Electronically Signed   By: Misty Stanley M.D.   On: 03/08/2018 08:47    ASSESSMENT AND PLAN: This is a very pleasant 72 years old white female with suspicious for stage IIIA non-small cell lung cancer, adenocarcinoma presented mainly with right lower lobe lung mass in addition to subcarinal lymphadenopathy but there was suspicious metastatic pulmonary nodules. The patient completed a  course of concurrent chemoradiation with weekly carboplatin and paclitaxel.  She tolerated her treatment well with no concerning complaints. The patient is currently undergoing consolidation treatment with immunotherapy with Imfinzi (Durvalumab) status post 13 cycles. She continues to tolerate her treatment well with no concerning adverse effects. I recommended for the patient to proceed with cycle #14 today as scheduled. I will see her back for follow-up visit in 2 weeks for evaluation before starting cycle #15. The patient was advised to call immediately if she has any concerning symptoms in the interval. All questions were answered. The patient knows to call the clinic with any problems, questions or concerns. We can certainly see the patient much sooner if necessary.   Disclaimer: This note was dictated with voice recognition software. Similar sounding words can inadvertently be transcribed and may not be corrected upon review.

## 2018-03-24 NOTE — Telephone Encounter (Signed)
3 cycles already scheduled - no additional appts added.

## 2018-03-24 NOTE — Patient Instructions (Signed)
[facility] Discharge Instructions for Patients Receiving Chemotherapy  Today you received the following chemotherapy agents: Imfinzi.  To help prevent nausea and vomiting after your treatment, we encourage you to take your nausea medication as directed.   If you develop nausea and vomiting that is not controlled by your nausea medication, call the clinic.   BELOW ARE SYMPTOMS THAT SHOULD BE REPORTED IMMEDIATELY:  *FEVER GREATER THAN 100.5 F  *CHILLS WITH OR WITHOUT FEVER  NAUSEA AND VOMITING THAT IS NOT CONTROLLED WITH YOUR NAUSEA MEDICATION  *UNUSUAL SHORTNESS OF BREATH  *UNUSUAL BRUISING OR BLEEDING  TENDERNESS IN MOUTH AND THROAT WITH OR WITHOUT PRESENCE OF ULCERS  *URINARY PROBLEMS  *BOWEL PROBLEMS  UNUSUAL RASH Items with * indicate a potential emergency and should be followed up as soon as possible.  Feel free to call the clinic should you have any questions or concerns. The clinic phone number is [phone].  Please show the CHEMO ALERT CARD at check-in to the Emergency Department and triage nurse.   

## 2018-04-05 ENCOUNTER — Inpatient Hospital Stay: Payer: PPO

## 2018-04-05 ENCOUNTER — Inpatient Hospital Stay (HOSPITAL_BASED_OUTPATIENT_CLINIC_OR_DEPARTMENT_OTHER): Payer: PPO | Admitting: Internal Medicine

## 2018-04-05 ENCOUNTER — Encounter: Payer: Self-pay | Admitting: Internal Medicine

## 2018-04-05 VITALS — BP 110/63 | HR 86 | Temp 98.5°F | Resp 20 | Ht 59.0 in | Wt 130.7 lb

## 2018-04-05 DIAGNOSIS — Z923 Personal history of irradiation: Secondary | ICD-10-CM | POA: Diagnosis not present

## 2018-04-05 DIAGNOSIS — C3431 Malignant neoplasm of lower lobe, right bronchus or lung: Secondary | ICD-10-CM | POA: Diagnosis not present

## 2018-04-05 DIAGNOSIS — E041 Nontoxic single thyroid nodule: Secondary | ICD-10-CM

## 2018-04-05 DIAGNOSIS — Z9221 Personal history of antineoplastic chemotherapy: Secondary | ICD-10-CM

## 2018-04-05 DIAGNOSIS — J439 Emphysema, unspecified: Secondary | ICD-10-CM

## 2018-04-05 DIAGNOSIS — M129 Arthropathy, unspecified: Secondary | ICD-10-CM

## 2018-04-05 DIAGNOSIS — Z5112 Encounter for antineoplastic immunotherapy: Secondary | ICD-10-CM

## 2018-04-05 DIAGNOSIS — Z79899 Other long term (current) drug therapy: Secondary | ICD-10-CM

## 2018-04-05 DIAGNOSIS — E119 Type 2 diabetes mellitus without complications: Secondary | ICD-10-CM | POA: Diagnosis not present

## 2018-04-05 DIAGNOSIS — K449 Diaphragmatic hernia without obstruction or gangrene: Secondary | ICD-10-CM

## 2018-04-05 LAB — CMP (CANCER CENTER ONLY)
ALT: 21 U/L (ref 0–44)
AST: 20 U/L (ref 15–41)
Albumin: 3.8 g/dL (ref 3.5–5.0)
Alkaline Phosphatase: 96 U/L (ref 38–126)
Anion gap: 8 (ref 5–15)
BUN: 15 mg/dL (ref 8–23)
CO2: 23 mmol/L (ref 22–32)
Calcium: 9.8 mg/dL (ref 8.9–10.3)
Chloride: 109 mmol/L (ref 98–111)
Creatinine: 0.83 mg/dL (ref 0.44–1.00)
GFR, Est AFR Am: >60 mL/min (ref >60)
GFR, Estimated: >60 mL/min (ref >60)
Glucose, Bld: 113 mg/dL — ABNORMAL HIGH (ref 70–99)
Potassium: 4.2 mmol/L (ref 3.5–5.1)
Sodium: 140 mmol/L (ref 135–145)
Total Bilirubin: 0.4 mg/dL (ref 0.3–1.2)
Total Protein: 7.1 g/dL (ref 6.5–8.1)

## 2018-04-05 LAB — CBC WITH DIFFERENTIAL (CANCER CENTER ONLY)
Abs Immature Granulocytes: 0.02 10*3/uL (ref 0.00–0.07)
Basophils Absolute: 0 10*3/uL (ref 0.0–0.1)
Basophils Relative: 0 %
Eosinophils Absolute: 0.1 10*3/uL (ref 0.0–0.5)
Eosinophils Relative: 1 %
HCT: 40.8 % (ref 36.0–46.0)
Hemoglobin: 13.7 g/dL (ref 12.0–15.0)
Immature Granulocytes: 0 %
Lymphocytes Relative: 14 %
Lymphs Abs: 1 10*3/uL (ref 0.7–4.0)
MCH: 31.6 pg (ref 26.0–34.0)
MCHC: 33.6 g/dL (ref 30.0–36.0)
MCV: 94 fL (ref 80.0–100.0)
Monocytes Absolute: 0.6 10*3/uL (ref 0.1–1.0)
Monocytes Relative: 9 %
Neutro Abs: 5 10*3/uL (ref 1.7–7.7)
Neutrophils Relative %: 76 %
Platelet Count: 213 10*3/uL (ref 150–400)
RBC: 4.34 MIL/uL (ref 3.87–5.11)
RDW: 12.4 % (ref 11.5–15.5)
WBC Count: 6.7 10*3/uL (ref 4.0–10.5)
nRBC: 0 % (ref 0.0–0.2)

## 2018-04-05 LAB — TSH: TSH: 2.617 u[IU]/mL (ref 0.308–3.960)

## 2018-04-05 MED ORDER — SODIUM CHLORIDE 0.9 % IV SOLN
620.0000 mg | Freq: Once | INTRAVENOUS | Status: AC
  Administered 2018-04-05: 620 mg via INTRAVENOUS
  Filled 2018-04-05: qty 10

## 2018-04-05 MED ORDER — SODIUM CHLORIDE 0.9 % IV SOLN
Freq: Once | INTRAVENOUS | Status: AC
  Administered 2018-04-05: 11:00:00 via INTRAVENOUS
  Filled 2018-04-05: qty 250

## 2018-04-05 NOTE — Patient Instructions (Signed)
[facility] Discharge Instructions for Patients Receiving Chemotherapy  Today you received the following chemotherapy agents:durvalumab (Imfinzi)  To help prevent nausea and vomiting after your treatment, we encourage you to take your nausea medication as directed.    If you develop nausea and vomiting that is not controlled by your nausea medication, call the clinic.   BELOW ARE SYMPTOMS THAT SHOULD BE REPORTED IMMEDIATELY:  *FEVER GREATER THAN 100.5 F  *CHILLS WITH OR WITHOUT FEVER  NAUSEA AND VOMITING THAT IS NOT CONTROLLED WITH YOUR NAUSEA MEDICATION  *UNUSUAL SHORTNESS OF BREATH  *UNUSUAL BRUISING OR BLEEDING  TENDERNESS IN MOUTH AND THROAT WITH OR WITHOUT PRESENCE OF ULCERS  *URINARY PROBLEMS  *BOWEL PROBLEMS  UNUSUAL RASH Items with * indicate a potential emergency and should be followed up as soon as possible.  Feel free to call the clinic should you have any questions or concerns. The clinic phone number is [phone].  Please show the [place] at check-in to the Emergency Department and triage nurse.

## 2018-04-05 NOTE — Progress Notes (Signed)
[facility] [phone]   [phone]  OFFICE PROGRESS NOTE  [name], MD [place] [address]  DIAGNOSIS: Stage IIIA (T2a,N2, M0) non-small cell lung cancer,adenocarcinomapresenting with right lower lobe lung mass in addition to subcarinal lymphadenopathy and suspicious metastatic pulmonary nodules diagnosed in February 2019.  PRIOR THERAPY: Concurrent chemoradiation with carboplatin for an AUC of 2 with paclitaxel 45 mg/m weekly. First dose given on 06/28/2017.  Status post 7 cycles.  Last dose was giving August 09, 2017 with partial response.  CURRENT THERAPY: Consolidation immunotherapy with Imfinzi (Durvalumab) 10 mg/KG every 2 weeks, first dose 09/23/2017.  Status post 14 cycles.  INTERVAL HISTORY: Katelyn Kennedy 72 y.o. female returns to the clinic today for follow-up visit.  The patient is feeling fine today with no concerning complaints.  She denied having any chest pain, shortness of breath, cough or hemoptysis.  She denied having any fever or chills.  She has no nausea, vomiting, diarrhea or constipation.  She continues to tolerate her treatment with Imfinzi fairly well.  She is here today for evaluation before starting cycle #15.  MEDICAL HISTORY: Past Medical History:  Diagnosis Date  . Allergic rhinitis   . Arthritis   . Diabetes mellitus without complication (HCC)    diet control/no meds per pt  . Dyspnea   . History of chicken pox   . Smoker     ALLERGIES:  is allergic to codeine.  MEDICATIONS:  Current Outpatient Medications  Medication Sig Dispense Refill  . acetaminophen (TYLENOL) 500 MG tablet Take 500 mg by mouth daily as needed for moderate pain or headache.    Marland Kitchen atorvastatin (LIPITOR) 20 MG tablet Take 1 tablet (20 mg total) by mouth daily. 90 tablet 3  . Calcium Carbonate-Vitamin D (CALCIUM + D) 600-200 MG-UNIT per tablet Take 1 tablet by mouth daily.     . fluticasone (FLONASE) 50  MCG/ACT nasal spray PLACE 2 SPRAYS INTO THE NOSE DAILY. 16 g 5  . lisinopril (PRINIVIL,ZESTRIL) 5 MG tablet TAKE 1/2 TABLET BY MOUTH ONCE DAILY 45 tablet 0  . Multiple Vitamin (MULTIVITAMIN) capsule Take 1 capsule by mouth daily.      Vladimir Faster Glycol-Propyl Glycol (SYSTANE) 0.4-0.3 % SOLN Apply to eye.    . traZODone (DESYREL) 50 MG tablet Take 0.5-1 tablets (25-50 mg total) by mouth at bedtime as needed for sleep. 30 tablet 3  . diphenhydramine-acetaminophen (TYLENOL PM) 25-500 MG TABS tablet Take 2 tablets by mouth at bedtime as needed.    . prochlorperazine (COMPAZINE) 10 MG tablet Take 1 tablet (10 mg total) by mouth every 6 (six) hours as needed for nausea or vomiting. (Patient not taking: Reported on 03/24/2018) 30 tablet 0  . traMADol (ULTRAM) 50 MG tablet Take 1 tablet (50 mg total) by mouth every 6 (six) hours as needed for moderate pain. (Patient not taking: Reported on 03/24/2018) 15 tablet 0   No current facility-administered medications for this visit.     SURGICAL HISTORY:  Past Surgical History:  Procedure Laterality Date  . ANKLE FRACTURE SURGERY Left 10 years ago   "hard to wake up from anesthesia" per pt  . [place]  . BREAST BIOPSY Right 1978   BENIGN CYST  . BREAST EXCISIONAL BIOPSY Left 2011   Benign  . BREAST EXCISIONAL BIOPSY Right 1985   Benign   . CATARACT EXTRACTION    . CATARACT EXTRACTION W/ INTRAOCULAR LENS  IMPLANT Bilateral   . COLONOSCOPY  2011  . ELECTROMAGNETIC NAVIGATION BROCHOSCOPY N/A 06/08/2017   Procedure: ELECTROMAGNETIC NAVIGATION BRONCHOSCOPY;  Surgeon: Flora Lipps, MD;  Location: [facility] ORS;  Service: Cardiopulmonary;  Laterality: N/A;  . POLYPECTOMY  2011  . 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 TUBAL LIGATION      REVIEW  OF SYSTEMS:  A comprehensive review of systems was negative.   PHYSICAL EXAMINATION: General appearance: alert, cooperative and no distress Head: Normocephalic, without obvious abnormality, atraumatic Neck: no adenopathy, no JVD, supple, symmetrical, trachea midline and thyroid not enlarged, symmetric, no tenderness/mass/nodules Lymph nodes: Cervical, supraclavicular, and axillary nodes normal. Resp: clear to auscultation bilaterally Back: symmetric, no curvature. ROM normal. No CVA tenderness. Cardio: regular rate and rhythm, S1, S2 normal, no murmur, click, rub or gallop GI: soft, non-tender; bowel sounds normal; no masses,  no organomegaly Extremities: extremities normal, atraumatic, no cyanosis or edema  ECOG PERFORMANCE STATUS: 0 - Asymptomatic  Blood pressure 110/63, pulse 86, temperature 98.5 F (36.9 C), temperature source Oral, resp. rate 20, height 4\' 11"  (1.499 m), weight 130 lb 11.2 oz (59.3 kg), SpO2 98 %.  LABORATORY DATA: Lab Results  Component Value Date   WBC 6.7 04/05/2018   HGB 13.7 04/05/2018   HCT 40.8 04/05/2018   MCV 94.0 04/05/2018   PLT 213 04/05/2018      Chemistry      Component Value Date/Time   NA 140 04/05/2018 0914   K 4.2 04/05/2018 0914   CL 109 04/05/2018 0914   CO2 23 04/05/2018 0914   BUN 15 04/05/2018 0914   CREATININE 0.83 04/05/2018 0914      Component Value Date/Time   CALCIUM 9.8 04/05/2018 0914   ALKPHOS 96 04/05/2018 0914   AST 20 04/05/2018 0914   ALT 21 04/05/2018 0914   BILITOT 0.4 04/05/2018 0914       RADIOGRAPHIC STUDIES: Ct Chest W Contrast  Result Date: 03/08/2018 CLINICAL DATA:  Non-small-cell lung cancer. EXAM: CT CHEST WITH CONTRAST TECHNIQUE: Multidetector CT imaging of the chest was performed during intravenous contrast administration. CONTRAST:  63mL OMNIPAQUE IOHEXOL 300 MG/ML  SOLN COMPARISON:  12/14/2017. FINDINGS: Cardiovascular: The heart size is normal. No substantial pericardial effusion. Coronary  artery calcification is evident. Atherosclerotic calcification is noted in the wall of the thoracic aorta. Mediastinum/Nodes: No mediastinal lymphadenopathy. No left hilar lymphadenopathy. Soft tissue in the right hilum is stable and compatible with treatment related change. Tiny hiatal hernia. The esophagus has normal imaging features. Stable 14 mm left thyroid nodule with additional nodules identified in both thyroid lobes. There is no axillary lymphadenopathy. Lungs/Pleura: The central tracheobronchial airways are patent. Centrilobular and paraseptal emphysema again noted. There is a new 9 x 12 mm right upper lobe nodule (24/5). Bandlike interstitial and airspace disease in the right parahilar lung is similar to prior and compatible with evolving radiation fibrosis. 7 mm right lower lobe nodule identified previously is stable (93/5). No pleural effusion. Upper Abdomen: The small hypoattenuating lesion in the posterior liver identified previously is unchanged (112/2). Tiny hypervascular focus in the anterior left liver (110/2) not definitely seen previously. Musculoskeletal: No worrisome lytic or sclerotic osseous abnormality. IMPRESSION: 1. Interval development of a new 9 x 12 mm right upper lobe pulmonary nodule. Potentially infectious/inflammatory, but metastatic disease not excluded. 2. Stable 7  mm right lower lobe pulmonary nodule. 3. Similar appearance of evolving radiation fibrosis in the parahilar right lung. 4.  Emphysema. (ICD10-J43.9) 5.  Aortic Atherosclerois (ICD10-170.0) Electronically Signed   By: Misty Stanley M.D.   On: 03/08/2018 08:47    ASSESSMENT AND PLAN: This is a very pleasant 72 years old white female with suspicious for stage IIIA non-small cell lung cancer, adenocarcinoma presented mainly with right lower lobe lung mass in addition to subcarinal lymphadenopathy but there was suspicious metastatic pulmonary nodules. The patient completed a course of concurrent chemoradiation with  weekly carboplatin and paclitaxel.  She tolerated her treatment well with no concerning complaints. The patient is currently undergoing consolidation treatment with immunotherapy with Imfinzi (Durvalumab) status post 14 cycles. She continues to tolerate her treatment well with no concerning adverse effects. I recommended for her to proceed with cycle #15 today as scheduled. I will see the patient back for follow-up visit in 2 weeks for evaluation before the next cycle of her treatment. She was advised to call immediately if she has any concerning symptoms in the interval. All questions were answered. The patient knows to call the clinic with any problems, questions or concerns. We can certainly see the patient much sooner if necessary.   Disclaimer: This note was dictated with voice recognition software. Similar sounding words can inadvertently be transcribed and may not be corrected upon review.

## 2018-04-07 ENCOUNTER — Telehealth: Payer: Self-pay | Admitting: Internal Medicine

## 2018-04-07 NOTE — Telephone Encounter (Signed)
Scheduled appt per 12/24 los -pt to get an updated schedule next visit.   

## 2018-04-14 ENCOUNTER — Other Ambulatory Visit: Payer: Self-pay | Admitting: Family Medicine

## 2018-04-21 ENCOUNTER — Inpatient Hospital Stay: Payer: PPO | Attending: Internal Medicine

## 2018-04-21 ENCOUNTER — Encounter: Payer: Self-pay | Admitting: Internal Medicine

## 2018-04-21 ENCOUNTER — Inpatient Hospital Stay: Payer: PPO

## 2018-04-21 ENCOUNTER — Telehealth: Payer: Self-pay | Admitting: Internal Medicine

## 2018-04-21 ENCOUNTER — Inpatient Hospital Stay (HOSPITAL_BASED_OUTPATIENT_CLINIC_OR_DEPARTMENT_OTHER): Payer: PPO | Admitting: Internal Medicine

## 2018-04-21 VITALS — BP 114/88 | HR 94 | Temp 97.5°F | Resp 18 | Ht 59.0 in | Wt 133.0 lb

## 2018-04-21 DIAGNOSIS — R0602 Shortness of breath: Secondary | ICD-10-CM

## 2018-04-21 DIAGNOSIS — Z79899 Other long term (current) drug therapy: Secondary | ICD-10-CM

## 2018-04-21 DIAGNOSIS — C3431 Malignant neoplasm of lower lobe, right bronchus or lung: Secondary | ICD-10-CM | POA: Diagnosis not present

## 2018-04-21 DIAGNOSIS — E119 Type 2 diabetes mellitus without complications: Secondary | ICD-10-CM

## 2018-04-21 DIAGNOSIS — F17210 Nicotine dependence, cigarettes, uncomplicated: Secondary | ICD-10-CM | POA: Diagnosis not present

## 2018-04-21 DIAGNOSIS — M129 Arthropathy, unspecified: Secondary | ICD-10-CM | POA: Insufficient documentation

## 2018-04-21 DIAGNOSIS — Z5112 Encounter for antineoplastic immunotherapy: Secondary | ICD-10-CM | POA: Insufficient documentation

## 2018-04-21 LAB — CMP (CANCER CENTER ONLY)
ALT: 24 U/L (ref 0–44)
AST: 21 U/L (ref 15–41)
Albumin: 3.9 g/dL (ref 3.5–5.0)
Alkaline Phosphatase: 102 U/L (ref 38–126)
Anion gap: 14 (ref 5–15)
BUN: 18 mg/dL (ref 8–23)
CO2: 18 mmol/L — ABNORMAL LOW (ref 22–32)
Calcium: 9.5 mg/dL (ref 8.9–10.3)
Chloride: 108 mmol/L (ref 98–111)
Creatinine: 0.82 mg/dL (ref 0.44–1.00)
GFR, Est AFR Am: >60 mL/min (ref >60)
GFR, Estimated: >60 mL/min (ref >60)
Glucose, Bld: 126 mg/dL — ABNORMAL HIGH (ref 70–99)
Potassium: 3.8 mmol/L (ref 3.5–5.1)
Sodium: 140 mmol/L (ref 135–145)
Total Bilirubin: 0.4 mg/dL (ref 0.3–1.2)
Total Protein: 7 g/dL (ref 6.5–8.1)

## 2018-04-21 LAB — CBC WITH DIFFERENTIAL (CANCER CENTER ONLY)
Abs Immature Granulocytes: 0.02 10*3/uL (ref 0.00–0.07)
Basophils Absolute: 0 10*3/uL (ref 0.0–0.1)
Basophils Relative: 0 %
Eosinophils Absolute: 0.1 10*3/uL (ref 0.0–0.5)
Eosinophils Relative: 1 %
HCT: 41.3 % (ref 36.0–46.0)
Hemoglobin: 13.6 g/dL (ref 12.0–15.0)
Immature Granulocytes: 0 %
Lymphocytes Relative: 20 %
Lymphs Abs: 1.2 10*3/uL (ref 0.7–4.0)
MCH: 31.3 pg (ref 26.0–34.0)
MCHC: 32.9 g/dL (ref 30.0–36.0)
MCV: 95.2 fL (ref 80.0–100.0)
Monocytes Absolute: 0.6 10*3/uL (ref 0.1–1.0)
Monocytes Relative: 10 %
Neutro Abs: 4.1 10*3/uL (ref 1.7–7.7)
Neutrophils Relative %: 69 %
Platelet Count: 208 10*3/uL (ref 150–400)
RBC: 4.34 MIL/uL (ref 3.87–5.11)
RDW: 12.4 % (ref 11.5–15.5)
WBC Count: 6 10*3/uL (ref 4.0–10.5)
nRBC: 0 % (ref 0.0–0.2)

## 2018-04-21 MED ORDER — SODIUM CHLORIDE 0.9 % IV SOLN
620.0000 mg | Freq: Once | INTRAVENOUS | Status: AC
  Administered 2018-04-21: 620 mg via INTRAVENOUS
  Filled 2018-04-21: qty 10

## 2018-04-21 MED ORDER — SODIUM CHLORIDE 0.9 % IV SOLN
Freq: Once | INTRAVENOUS | Status: AC
  Administered 2018-04-21: 09:00:00 via INTRAVENOUS
  Filled 2018-04-21: qty 250

## 2018-04-21 NOTE — Telephone Encounter (Signed)
3 cycles already scheduled per 1/9 los.

## 2018-04-21 NOTE — Progress Notes (Signed)
[facility] [phone]   [phone]  OFFICE PROGRESS NOTE  [name], MD [place] [address]  DIAGNOSIS: Stage IIIA (T2a,N2, M0) non-small cell lung cancer,adenocarcinomapresenting with right lower lobe lung mass in addition to subcarinal lymphadenopathy and suspicious metastatic pulmonary nodules diagnosed in February 2019.  PRIOR THERAPY: Concurrent chemoradiation with carboplatin for an AUC of 2 with paclitaxel 45 mg/m weekly. First dose given on 06/28/2017.  Status post 7 cycles.  Last dose was giving August 09, 2017 with partial response.  CURRENT THERAPY: Consolidation immunotherapy with Imfinzi (Durvalumab) 10 mg/KG every 2 weeks, first dose 09/23/2017.  Status post 15 cycles.  INTERVAL HISTORY: Katelyn Kennedy 73 y.o. female returns to the clinic today for follow-up visit.  The patient is feeling fine today with no concerning complaints.  She denied having any chest pain, shortness of breath, cough or hemoptysis.  She has no fever or chills.  She has no nausea, vomiting, diarrhea or constipation.  She continues to tolerate her treatment with Imfinzi fairly well.  The patient is here today for evaluation before starting cycle #16.   MEDICAL HISTORY: Past Medical History:  Diagnosis Date  . Allergic rhinitis   . Arthritis   . Diabetes mellitus without complication (HCC)    diet control/no meds per pt  . Dyspnea   . History of chicken pox   . Smoker     ALLERGIES:  is allergic to codeine.  MEDICATIONS:  Current Outpatient Medications  Medication Sig Dispense Refill  . acetaminophen (TYLENOL) 500 MG tablet Take 500 mg by mouth daily as needed for moderate pain or headache.    Marland Kitchen atorvastatin (LIPITOR) 20 MG tablet Take 1 tablet (20 mg total) by mouth daily. 90 tablet 3  . Calcium Carbonate-Vitamin D (CALCIUM + D) 600-200 MG-UNIT per tablet Take 1 tablet by mouth daily.     . diphenhydramine-acetaminophen  (TYLENOL PM) 25-500 MG TABS tablet Take 2 tablets by mouth at bedtime as needed.    . fluticasone (FLONASE) 50 MCG/ACT nasal spray PLACE 2 SPRAYS INTO THE NOSE DAILY. 16 g 5  . lisinopril (PRINIVIL,ZESTRIL) 5 MG tablet TAKE 1/2 TABLET BY MOUTH ONCE DAILY 45 tablet 1  . Multiple Vitamin (MULTIVITAMIN) capsule Take 1 capsule by mouth daily.      Vladimir Faster Glycol-Propyl Glycol (SYSTANE) 0.4-0.3 % SOLN Apply to eye.    . traZODone (DESYREL) 50 MG tablet Take 0.5-1 tablets (25-50 mg total) by mouth at bedtime as needed for sleep. 30 tablet 3  . prochlorperazine (COMPAZINE) 10 MG tablet Take 1 tablet (10 mg total) by mouth every 6 (six) hours as needed for nausea or vomiting. (Patient not taking: Reported on 03/24/2018) 30 tablet 0  . traMADol (ULTRAM) 50 MG tablet Take 1 tablet (50 mg total) by mouth every 6 (six) hours as needed for moderate pain. (Patient not taking: Reported on 03/24/2018) 15 tablet 0   No current facility-administered medications for this visit.     SURGICAL HISTORY:  Past Surgical History:  Procedure Laterality Date  . ANKLE FRACTURE SURGERY Left 10 years ago   "hard to wake up from anesthesia" per pt  . [place]  . BREAST BIOPSY Right 1978   BENIGN CYST  . BREAST EXCISIONAL BIOPSY Left 2011   Benign  . BREAST EXCISIONAL BIOPSY Right 1985   Benign   . CATARACT EXTRACTION    . CATARACT EXTRACTION W/ INTRAOCULAR  LENS IMPLANT Bilateral   . COLONOSCOPY  2011  . ELECTROMAGNETIC NAVIGATION BROCHOSCOPY N/A 06/08/2017   Procedure: ELECTROMAGNETIC NAVIGATION BRONCHOSCOPY;  Surgeon: Flora Lipps, MD;  Location: [facility] ORS;  Service: Cardiopulmonary;  Laterality: N/A;  . POLYPECTOMY  2011  . 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 TUBAL LIGATION      REVIEW OF  SYSTEMS:  A comprehensive review of systems was negative.   PHYSICAL EXAMINATION: General appearance: alert, cooperative and no distress Head: Normocephalic, without obvious abnormality, atraumatic Neck: no adenopathy, no JVD, supple, symmetrical, trachea midline and thyroid not enlarged, symmetric, no tenderness/mass/nodules Lymph nodes: Cervical, supraclavicular, and axillary nodes normal. Resp: clear to auscultation bilaterally Back: symmetric, no curvature. ROM normal. No CVA tenderness. Cardio: regular rate and rhythm, S1, S2 normal, no murmur, click, rub or gallop GI: soft, non-tender; bowel sounds normal; no masses,  no organomegaly Extremities: extremities normal, atraumatic, no cyanosis or edema  ECOG PERFORMANCE STATUS: 0 - Asymptomatic  Blood pressure 114/88, pulse 94, temperature (!) 97.5 F (36.4 C), temperature source Oral, resp. rate 18, height 4\' 11"  (1.499 m), weight 133 lb (60.3 kg), SpO2 99 %.  LABORATORY DATA: Lab Results  Component Value Date   WBC 6.0 04/21/2018   HGB 13.6 04/21/2018   HCT 41.3 04/21/2018   MCV 95.2 04/21/2018   PLT 208 04/21/2018      Chemistry      Component Value Date/Time   NA 140 04/05/2018 0914   K 4.2 04/05/2018 0914   CL 109 04/05/2018 0914   CO2 23 04/05/2018 0914   BUN 15 04/05/2018 0914   CREATININE 0.83 04/05/2018 0914      Component Value Date/Time   CALCIUM 9.8 04/05/2018 0914   ALKPHOS 96 04/05/2018 0914   AST 20 04/05/2018 0914   ALT 21 04/05/2018 0914   BILITOT 0.4 04/05/2018 0914       RADIOGRAPHIC STUDIES: No results found.  ASSESSMENT AND PLAN: This is a very pleasant 73 years old white female with suspicious for stage IIIA non-small cell lung cancer, adenocarcinoma presented mainly with right lower lobe lung mass in addition to subcarinal lymphadenopathy but there was suspicious metastatic pulmonary nodules. The patient completed a course of concurrent chemoradiation with weekly carboplatin and paclitaxel.   She tolerated her treatment well with no concerning complaints. The patient is currently undergoing consolidation treatment with immunotherapy with Imfinzi (Durvalumab) status post 15 cycles. She has been tolerating this treatment well with no concerning adverse effects. I recommended for the patient to proceed with cycle #16 today as scheduled. I will see the patient back for follow-up visit in 2 weeks for evaluation before the next cycle of her treatment. She was advised to call immediately if she has any concerning symptoms in the interval. All questions were answered. The patient knows to call the clinic with any problems, questions or concerns. We can certainly see the patient much sooner if necessary.   Disclaimer: This note was dictated with voice recognition software. Similar sounding words can inadvertently be transcribed and may not be corrected upon review.

## 2018-04-21 NOTE — Patient Instructions (Signed)
[facility] Discharge Instructions for Patients Receiving Chemotherapy  Today you received the following chemotherapy agents: Imfinzi.  To help prevent nausea and vomiting after your treatment, we encourage you to take your nausea medication as directed.   If you develop nausea and vomiting that is not controlled by your nausea medication, call the clinic.   BELOW ARE SYMPTOMS THAT SHOULD BE REPORTED IMMEDIATELY:  *FEVER GREATER THAN 100.5 F  *CHILLS WITH OR WITHOUT FEVER  NAUSEA AND VOMITING THAT IS NOT CONTROLLED WITH YOUR NAUSEA MEDICATION  *UNUSUAL SHORTNESS OF BREATH  *UNUSUAL BRUISING OR BLEEDING  TENDERNESS IN MOUTH AND THROAT WITH OR WITHOUT PRESENCE OF ULCERS  *URINARY PROBLEMS  *BOWEL PROBLEMS  UNUSUAL RASH Items with * indicate a potential emergency and should be followed up as soon as possible.  Feel free to call the clinic should you have any questions or concerns. The clinic phone number is [phone].  Please show the CHEMO ALERT CARD at check-in to the Emergency Department and triage nurse.   

## 2018-05-04 ENCOUNTER — Encounter: Payer: Self-pay | Admitting: Internal Medicine

## 2018-05-04 ENCOUNTER — Other Ambulatory Visit: Payer: Self-pay | Admitting: Medical Oncology

## 2018-05-04 ENCOUNTER — Telehealth: Payer: Self-pay | Admitting: Internal Medicine

## 2018-05-04 ENCOUNTER — Telehealth: Payer: Self-pay | Admitting: Medical Oncology

## 2018-05-04 ENCOUNTER — Inpatient Hospital Stay (HOSPITAL_BASED_OUTPATIENT_CLINIC_OR_DEPARTMENT_OTHER): Payer: PPO | Admitting: Internal Medicine

## 2018-05-04 ENCOUNTER — Inpatient Hospital Stay: Payer: PPO

## 2018-05-04 VITALS — BP 106/62 | HR 83 | Temp 98.4°F | Resp 18 | Ht 59.0 in | Wt 129.2 lb

## 2018-05-04 DIAGNOSIS — C3431 Malignant neoplasm of lower lobe, right bronchus or lung: Secondary | ICD-10-CM

## 2018-05-04 DIAGNOSIS — Z79899 Other long term (current) drug therapy: Secondary | ICD-10-CM | POA: Diagnosis not present

## 2018-05-04 DIAGNOSIS — R0602 Shortness of breath: Secondary | ICD-10-CM

## 2018-05-04 DIAGNOSIS — M129 Arthropathy, unspecified: Secondary | ICD-10-CM | POA: Diagnosis not present

## 2018-05-04 DIAGNOSIS — F17210 Nicotine dependence, cigarettes, uncomplicated: Secondary | ICD-10-CM | POA: Diagnosis not present

## 2018-05-04 DIAGNOSIS — Z5112 Encounter for antineoplastic immunotherapy: Secondary | ICD-10-CM

## 2018-05-04 DIAGNOSIS — E119 Type 2 diabetes mellitus without complications: Secondary | ICD-10-CM

## 2018-05-04 LAB — CBC WITH DIFFERENTIAL (CANCER CENTER ONLY)
Abs Immature Granulocytes: 0.02 10*3/uL (ref 0.00–0.07)
Basophils Absolute: 0 10*3/uL (ref 0.0–0.1)
Basophils Relative: 1 %
Eosinophils Absolute: 0.1 10*3/uL (ref 0.0–0.5)
Eosinophils Relative: 1 %
HCT: 41.2 % (ref 36.0–46.0)
Hemoglobin: 14 g/dL (ref 12.0–15.0)
Immature Granulocytes: 0 %
Lymphocytes Relative: 17 %
Lymphs Abs: 0.9 10*3/uL (ref 0.7–4.0)
MCH: 31.7 pg (ref 26.0–34.0)
MCHC: 34 g/dL (ref 30.0–36.0)
MCV: 93.4 fL (ref 80.0–100.0)
Monocytes Absolute: 0.5 10*3/uL (ref 0.1–1.0)
Monocytes Relative: 10 %
Neutro Abs: 3.7 10*3/uL (ref 1.7–7.7)
Neutrophils Relative %: 71 %
Platelet Count: 234 10*3/uL (ref 150–400)
RBC: 4.41 MIL/uL (ref 3.87–5.11)
RDW: 12.5 % (ref 11.5–15.5)
WBC Count: 5.3 10*3/uL (ref 4.0–10.5)
nRBC: 0 % (ref 0.0–0.2)

## 2018-05-04 LAB — CMP (CANCER CENTER ONLY)
ALT: 18 U/L (ref 0–44)
AST: 19 U/L (ref 15–41)
Albumin: 4.1 g/dL (ref 3.5–5.0)
Alkaline Phosphatase: 104 U/L (ref 38–126)
Anion gap: 11 (ref 5–15)
BUN: 10 mg/dL (ref 8–23)
CO2: 22 mmol/L (ref 22–32)
Calcium: 10.2 mg/dL (ref 8.9–10.3)
Chloride: 108 mmol/L (ref 98–111)
Creatinine: 0.81 mg/dL (ref 0.44–1.00)
GFR, Est AFR Am: >60 mL/min (ref >60)
GFR, Estimated: >60 mL/min (ref >60)
Glucose, Bld: 112 mg/dL — ABNORMAL HIGH (ref 70–99)
Potassium: 4.1 mmol/L (ref 3.5–5.1)
Sodium: 141 mmol/L (ref 135–145)
Total Bilirubin: 0.5 mg/dL (ref 0.3–1.2)
Total Protein: 7.4 g/dL (ref 6.5–8.1)

## 2018-05-04 LAB — TSH: TSH: 2.216 u[IU]/mL (ref 0.308–3.960)

## 2018-05-04 MED ORDER — SODIUM CHLORIDE 0.9% FLUSH
10.0000 mL | INTRAVENOUS | Status: DC | PRN
  Filled 2018-05-04: qty 10

## 2018-05-04 MED ORDER — SODIUM CHLORIDE 0.9 % IV SOLN
Freq: Once | INTRAVENOUS | Status: AC
  Administered 2018-05-04: 13:00:00 via INTRAVENOUS
  Filled 2018-05-04: qty 250

## 2018-05-04 MED ORDER — SODIUM CHLORIDE 0.9 % IV SOLN
10.9000 mg/kg | Freq: Once | INTRAVENOUS | Status: AC
  Administered 2018-05-04: 620 mg via INTRAVENOUS
  Filled 2018-05-04: qty 10

## 2018-05-04 NOTE — Telephone Encounter (Signed)
Next cycle already scheduled - unable to add another cycle due to MD availability.

## 2018-05-04 NOTE — Patient Instructions (Signed)
[facility] Discharge Instructions for Patients Receiving Chemotherapy  Today you received the following chemotherapy agents: Imfinzi.  To help prevent nausea and vomiting after your treatment, we encourage you to take your nausea medication as directed.   If you develop nausea and vomiting that is not controlled by your nausea medication, call the clinic.   BELOW ARE SYMPTOMS THAT SHOULD BE REPORTED IMMEDIATELY:  *FEVER GREATER THAN 100.5 F  *CHILLS WITH OR WITHOUT FEVER  NAUSEA AND VOMITING THAT IS NOT CONTROLLED WITH YOUR NAUSEA MEDICATION  *UNUSUAL SHORTNESS OF BREATH  *UNUSUAL BRUISING OR BLEEDING  TENDERNESS IN MOUTH AND THROAT WITH OR WITHOUT PRESENCE OF ULCERS  *URINARY PROBLEMS  *BOWEL PROBLEMS  UNUSUAL RASH Items with * indicate a potential emergency and should be followed up as soon as possible.  Feel free to call the clinic should you have any questions or concerns. The clinic phone number is [phone].  Please show the CHEMO ALERT CARD at check-in to the Emergency Department and triage nurse.   

## 2018-05-04 NOTE — Progress Notes (Signed)
[place] Telephone:([phone]) [phone]   Fax:(336) [phone]  OFFICE PROGRESS NOTE  [name], MD [place] [address]  DIAGNOSIS: Stage IIIA (T2a,N2, M0) non-small cell lung cancer,adenocarcinomapresenting with right lower lobe lung mass in addition to subcarinal lymphadenopathy and suspicious metastatic pulmonary nodules diagnosed in February 2019.  PRIOR THERAPY: Concurrent chemoradiation with carboplatin for an AUC of 2 with paclitaxel 45 mg/m weekly. First dose given on 06/28/2017.  Status post 7 cycles.  Last dose was giving August 09, 2017 with partial response.  CURRENT THERAPY: Consolidation immunotherapy with Imfinzi (Durvalumab) 10 mg/KG every 2 weeks, first dose 09/23/2017.  Status post 16 cycles.  INTERVAL HISTORY: Katelyn Kennedy 73 y.o. female returns to the clinic today for follow-up visit.  The patient is feeling fine today with no concerning complaints.  She denied having any current chest pain, shortness of breath, cough or hemoptysis.  She denied having any fever or chills.  She has no nausea, vomiting, diarrhea or constipation.  She lost 3 pounds since her last visit.  The patient is here today for evaluation before starting cycle #17.  MEDICAL HISTORY: Past Medical History:  Diagnosis Date  . Allergic rhinitis   . Arthritis   . Diabetes mellitus without complication (HCC)    diet control/no meds per pt  . Dyspnea   . History of chicken pox   . Smoker     ALLERGIES:  is allergic to codeine.  MEDICATIONS:  Current Outpatient Medications  Medication Sig Dispense Refill  . acetaminophen (TYLENOL) 500 MG tablet Take 500 mg by mouth daily as needed for moderate pain or headache.    Marland Kitchen atorvastatin (LIPITOR) 20 MG tablet Take 1 tablet (20 mg total) by mouth daily. 90 tablet 3  . Calcium Carbonate-Vitamin D (CALCIUM + D) 600-200 MG-UNIT per tablet Take 1 tablet by mouth daily.     . diphenhydramine-acetaminophen (TYLENOL  PM) 25-500 MG TABS tablet Take 2 tablets by mouth at bedtime as needed.    . fluticasone (FLONASE) 50 MCG/ACT nasal spray PLACE 2 SPRAYS INTO THE NOSE DAILY. 16 g 5  . lisinopril (PRINIVIL,ZESTRIL) 5 MG tablet TAKE 1/2 TABLET BY MOUTH ONCE DAILY 45 tablet 1  . Multiple Vitamin (MULTIVITAMIN) capsule Take 1 capsule by mouth daily.      Vladimir Faster Glycol-Propyl Glycol (SYSTANE) 0.4-0.3 % SOLN Apply to eye.    . prochlorperazine (COMPAZINE) 10 MG tablet Take 1 tablet (10 mg total) by mouth every 6 (six) hours as needed for nausea or vomiting. (Patient not taking: Reported on 03/24/2018) 30 tablet 0  . traMADol (ULTRAM) 50 MG tablet Take 1 tablet (50 mg total) by mouth every 6 (six) hours as needed for moderate pain. (Patient not taking: Reported on 03/24/2018) 15 tablet 0  . traZODone (DESYREL) 50 MG tablet Take 0.5-1 tablets (25-50 mg total) by mouth at bedtime as needed for sleep. 30 tablet 3   No current facility-administered medications for this visit.     SURGICAL HISTORY:  Past Surgical History:  Procedure Laterality Date  . ANKLE FRACTURE SURGERY Left 10 years ago   "hard to wake up from anesthesia" per pt  . [place]  . BREAST BIOPSY Right 1978   BENIGN CYST  . BREAST EXCISIONAL BIOPSY Left 2011   Benign  . BREAST EXCISIONAL BIOPSY Right 1985   Benign   . CATARACT EXTRACTION    . CATARACT EXTRACTION W/ INTRAOCULAR LENS  IMPLANT Bilateral   . COLONOSCOPY  2011  . ELECTROMAGNETIC NAVIGATION BROCHOSCOPY N/A 06/08/2017   Procedure: ELECTROMAGNETIC NAVIGATION BRONCHOSCOPY;  Surgeon: Flora Lipps, MD;  Location: [facility] ORS;  Service: Cardiopulmonary;  Laterality: N/A;  . POLYPECTOMY  2011  . 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 TUBAL LIGATION      REVIEW OF SYSTEMS:  A  comprehensive review of systems was negative.   PHYSICAL EXAMINATION: General appearance: alert, cooperative and no distress Head: Normocephalic, without obvious abnormality, atraumatic Neck: no adenopathy, no JVD, supple, symmetrical, trachea midline and thyroid not enlarged, symmetric, no tenderness/mass/nodules Lymph nodes: Cervical, supraclavicular, and axillary nodes normal. Resp: clear to auscultation bilaterally Back: symmetric, no curvature. ROM normal. No CVA tenderness. Cardio: regular rate and rhythm, S1, S2 normal, no murmur, click, rub or gallop GI: soft, non-tender; bowel sounds normal; no masses,  no organomegaly Extremities: extremities normal, atraumatic, no cyanosis or edema  ECOG PERFORMANCE STATUS: 0 - Asymptomatic  Blood pressure 106/62, pulse 83, temperature 98.4 F (36.9 C), temperature source Oral, resp. rate 18, height 4\' 11"  (1.499 m), weight 129 lb 3.2 oz (58.6 kg), SpO2 97 %.  LABORATORY DATA: Lab Results  Component Value Date   WBC 5.3 05/04/2018   HGB 14.0 05/04/2018   HCT 41.2 05/04/2018   MCV 93.4 05/04/2018   PLT 234 05/04/2018      Chemistry      Component Value Date/Time   NA 140 04/21/2018 0814   K 3.8 04/21/2018 0814   CL 108 04/21/2018 0814   CO2 18 (L) 04/21/2018 0814   BUN 18 04/21/2018 0814   CREATININE 0.82 04/21/2018 0814      Component Value Date/Time   CALCIUM 9.5 04/21/2018 0814   ALKPHOS 102 04/21/2018 0814   AST 21 04/21/2018 0814   ALT 24 04/21/2018 0814   BILITOT 0.4 04/21/2018 0814       RADIOGRAPHIC STUDIES: No results found.  ASSESSMENT AND PLAN: This is a very pleasant 73 years old white female with suspicious for stage IIIA non-small cell lung cancer, adenocarcinoma presented mainly with right lower lobe lung mass in addition to subcarinal lymphadenopathy but there was suspicious metastatic pulmonary nodules. The patient completed a course of concurrent chemoradiation with weekly carboplatin and paclitaxel.  She  tolerated her treatment well with no concerning complaints. The patient is currently undergoing consolidation treatment with immunotherapy with Imfinzi (Durvalumab) status post 16 cycles. The patient has been tolerating her treatment well with no concerning adverse effects. I recommended for her to proceed with cycle #17 today as scheduled. She will come back for follow-up visit in 2 weeks for evaluation before starting cycle #18. The patient was advised to call immediately if she has any concerning symptoms in the interval. All questions were answered. The patient knows to call the clinic with any problems, questions or concerns. We can certainly see the patient much sooner if necessary.   Disclaimer: This note was dictated with voice recognition software. Similar sounding words can inadvertently be transcribed and may not be corrected upon review.

## 2018-05-04 NOTE — Telephone Encounter (Signed)
Dead battery . Needs transportation. Transferred to [employer] [employer].

## 2018-05-18 ENCOUNTER — Inpatient Hospital Stay (HOSPITAL_BASED_OUTPATIENT_CLINIC_OR_DEPARTMENT_OTHER): Payer: PPO | Admitting: Internal Medicine

## 2018-05-18 ENCOUNTER — Inpatient Hospital Stay: Payer: PPO | Attending: Internal Medicine

## 2018-05-18 ENCOUNTER — Encounter: Payer: Self-pay | Admitting: Internal Medicine

## 2018-05-18 ENCOUNTER — Inpatient Hospital Stay: Payer: PPO

## 2018-05-18 VITALS — BP 118/78 | HR 93 | Temp 98.5°F | Resp 18 | Ht 59.0 in | Wt 130.0 lb

## 2018-05-18 DIAGNOSIS — J439 Emphysema, unspecified: Secondary | ICD-10-CM | POA: Insufficient documentation

## 2018-05-18 DIAGNOSIS — I10 Essential (primary) hypertension: Secondary | ICD-10-CM | POA: Insufficient documentation

## 2018-05-18 DIAGNOSIS — E119 Type 2 diabetes mellitus without complications: Secondary | ICD-10-CM | POA: Diagnosis not present

## 2018-05-18 DIAGNOSIS — Z5112 Encounter for antineoplastic immunotherapy: Secondary | ICD-10-CM

## 2018-05-18 DIAGNOSIS — Z79899 Other long term (current) drug therapy: Secondary | ICD-10-CM

## 2018-05-18 DIAGNOSIS — R918 Other nonspecific abnormal finding of lung field: Secondary | ICD-10-CM | POA: Insufficient documentation

## 2018-05-18 DIAGNOSIS — C3490 Malignant neoplasm of unspecified part of unspecified bronchus or lung: Secondary | ICD-10-CM

## 2018-05-18 DIAGNOSIS — M129 Arthropathy, unspecified: Secondary | ICD-10-CM

## 2018-05-18 DIAGNOSIS — C3431 Malignant neoplasm of lower lobe, right bronchus or lung: Secondary | ICD-10-CM

## 2018-05-18 LAB — CMP (CANCER CENTER ONLY)
ALT: 18 U/L (ref 0–44)
AST: 18 U/L (ref 15–41)
Albumin: 4.1 g/dL (ref 3.5–5.0)
Alkaline Phosphatase: 95 U/L (ref 38–126)
Anion gap: 10 (ref 5–15)
BUN: 17 mg/dL (ref 8–23)
CO2: 23 mmol/L (ref 22–32)
Calcium: 10.3 mg/dL (ref 8.9–10.3)
Chloride: 107 mmol/L (ref 98–111)
Creatinine: 0.84 mg/dL (ref 0.44–1.00)
GFR, Est AFR Am: >60 mL/min (ref >60)
GFR, Estimated: >60 mL/min (ref >60)
Glucose, Bld: 107 mg/dL — ABNORMAL HIGH (ref 70–99)
Potassium: 4.2 mmol/L (ref 3.5–5.1)
Sodium: 140 mmol/L (ref 135–145)
Total Bilirubin: 0.4 mg/dL (ref 0.3–1.2)
Total Protein: 7.2 g/dL (ref 6.5–8.1)

## 2018-05-18 LAB — CBC WITH DIFFERENTIAL (CANCER CENTER ONLY)
Abs Immature Granulocytes: 0.02 10*3/uL (ref 0.00–0.07)
Basophils Absolute: 0 10*3/uL (ref 0.0–0.1)
Basophils Relative: 0 %
Eosinophils Absolute: 0.1 10*3/uL (ref 0.0–0.5)
Eosinophils Relative: 2 %
HCT: 39.6 % (ref 36.0–46.0)
Hemoglobin: 13.6 g/dL (ref 12.0–15.0)
Immature Granulocytes: 0 %
Lymphocytes Relative: 19 %
Lymphs Abs: 1.2 10*3/uL (ref 0.7–4.0)
MCH: 31.7 pg (ref 26.0–34.0)
MCHC: 34.3 g/dL (ref 30.0–36.0)
MCV: 92.3 fL (ref 80.0–100.0)
Monocytes Absolute: 0.6 10*3/uL (ref 0.1–1.0)
Monocytes Relative: 10 %
Neutro Abs: 4.1 10*3/uL (ref 1.7–7.7)
Neutrophils Relative %: 69 %
Platelet Count: 224 10*3/uL (ref 150–400)
RBC: 4.29 MIL/uL (ref 3.87–5.11)
RDW: 12.6 % (ref 11.5–15.5)
WBC Count: 6 10*3/uL (ref 4.0–10.5)
nRBC: 0 % (ref 0.0–0.2)

## 2018-05-18 MED ORDER — SODIUM CHLORIDE 0.9 % IV SOLN
10.9000 mg/kg | Freq: Once | INTRAVENOUS | Status: AC
  Administered 2018-05-18: 620 mg via INTRAVENOUS
  Filled 2018-05-18: qty 2.4

## 2018-05-18 MED ORDER — SODIUM CHLORIDE 0.9 % IV SOLN
Freq: Once | INTRAVENOUS | Status: AC
  Administered 2018-05-18: 12:00:00 via INTRAVENOUS
  Filled 2018-05-18: qty 250

## 2018-05-18 NOTE — Patient Instructions (Signed)
[facility] Discharge Instructions for Patients Receiving Chemotherapy  Today you received the following chemotherapy agents: Imfinzi.  To help prevent nausea and vomiting after your treatment, we encourage you to take your nausea medication as directed.   If you develop nausea and vomiting that is not controlled by your nausea medication, call the clinic.   BELOW ARE SYMPTOMS THAT SHOULD BE REPORTED IMMEDIATELY:  *FEVER GREATER THAN 100.5 F  *CHILLS WITH OR WITHOUT FEVER  NAUSEA AND VOMITING THAT IS NOT CONTROLLED WITH YOUR NAUSEA MEDICATION  *UNUSUAL SHORTNESS OF BREATH  *UNUSUAL BRUISING OR BLEEDING  TENDERNESS IN MOUTH AND THROAT WITH OR WITHOUT PRESENCE OF ULCERS  *URINARY PROBLEMS  *BOWEL PROBLEMS  UNUSUAL RASH Items with * indicate a potential emergency and should be followed up as soon as possible.  Feel free to call the clinic should you have any questions or concerns. The clinic phone number is [phone].  Please show the CHEMO ALERT CARD at check-in to the Emergency Department and triage nurse.   

## 2018-05-18 NOTE — Progress Notes (Signed)
[place] Telephone:([phone]) [phone]   Fax:(336) [phone]  OFFICE PROGRESS NOTE  [name], MD [place] [address]  DIAGNOSIS: Stage IIIA (T2a,N2, M0) non-small cell lung cancer,adenocarcinomapresenting with right lower lobe lung mass in addition to subcarinal lymphadenopathy and suspicious metastatic pulmonary nodules diagnosed in February 2019.  PRIOR THERAPY: Concurrent chemoradiation with carboplatin for an AUC of 2 with paclitaxel 45 mg/m weekly. First dose given on 06/28/2017.  Status post 7 cycles.  Last dose was giving August 09, 2017 with partial response.  CURRENT THERAPY: Consolidation immunotherapy with Imfinzi (Durvalumab) 10 mg/KG every 2 weeks, first dose 09/23/2017.  Status post 17 cycles.  INTERVAL HISTORY: Katelyn Kennedy 73 y.o. female returns to the clinic today for follow-up visit.  The patient is feeling fine today with no concerning complaints.  She continues to tolerate her treatment with Imfinzi fairly well.  She denied having any skin rash or diarrhea.  She has no nausea, vomiting, diarrhea or constipation.  She has no chest pain, shortness of breath, cough or hemoptysis.  She has no significant weight loss.  She is here today for evaluation before starting cycle #18.   MEDICAL HISTORY: Past Medical History:  Diagnosis Date  . Allergic rhinitis   . Arthritis   . Diabetes mellitus without complication (HCC)    diet control/no meds per pt  . Dyspnea   . History of chicken pox   . Smoker     ALLERGIES:  is allergic to codeine.  MEDICATIONS:  Current Outpatient Medications  Medication Sig Dispense Refill  . acetaminophen (TYLENOL) 500 MG tablet Take 500 mg by mouth daily as needed for moderate pain or headache.    Marland Kitchen atorvastatin (LIPITOR) 20 MG tablet Take 1 tablet (20 mg total) by mouth daily. 90 tablet 3  . lisinopril (PRINIVIL,ZESTRIL) 5 MG tablet TAKE 1/2 TABLET BY MOUTH ONCE DAILY 45 tablet 1  .  Multiple Vitamin (MULTIVITAMIN) capsule Take 1 capsule by mouth daily.      Vladimir Faster Glycol-Propyl Glycol (SYSTANE) 0.4-0.3 % SOLN Apply to eye.    . traZODone (DESYREL) 50 MG tablet Take 0.5-1 tablets (25-50 mg total) by mouth at bedtime as needed for sleep. 30 tablet 3  . prochlorperazine (COMPAZINE) 10 MG tablet Take 1 tablet (10 mg total) by mouth every 6 (six) hours as needed for nausea or vomiting. (Patient not taking: Reported on 03/24/2018) 30 tablet 0   No current facility-administered medications for this visit.     SURGICAL HISTORY:  Past Surgical History:  Procedure Laterality Date  . ANKLE FRACTURE SURGERY Left 10 years ago   "hard to wake up from anesthesia" per pt  . [place]  . BREAST BIOPSY Right 1978   BENIGN CYST  . BREAST EXCISIONAL BIOPSY Left 2011   Benign  . BREAST EXCISIONAL BIOPSY Right 1985   Benign   . CATARACT EXTRACTION    . CATARACT EXTRACTION W/ INTRAOCULAR LENS IMPLANT Bilateral   . COLONOSCOPY  2011  . ELECTROMAGNETIC NAVIGATION BROCHOSCOPY N/A 06/08/2017   Procedure: ELECTROMAGNETIC NAVIGATION BRONCHOSCOPY;  Surgeon: Flora Lipps, MD;  Location: [facility] ORS;  Service: Cardiopulmonary;  Laterality: N/A;  . POLYPECTOMY  2011  . 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 TUBAL LIGATION  REVIEW OF SYSTEMS:  A comprehensive review of systems was negative.   PHYSICAL EXAMINATION: General appearance: alert, cooperative and no distress Head: Normocephalic, without obvious abnormality, atraumatic Neck: no adenopathy, no JVD, supple, symmetrical, trachea midline and thyroid not enlarged, symmetric, no tenderness/mass/nodules Lymph nodes: Cervical, supraclavicular, and axillary nodes normal. Resp: clear to auscultation bilaterally Back:  symmetric, no curvature. ROM normal. No CVA tenderness. Cardio: regular rate and rhythm, S1, S2 normal, no murmur, click, rub or gallop GI: soft, non-tender; bowel sounds normal; no masses,  no organomegaly Extremities: extremities normal, atraumatic, no cyanosis or edema  ECOG PERFORMANCE STATUS: 0 - Asymptomatic  Blood pressure 118/78, pulse 93, temperature 98.5 F (36.9 C), temperature source Oral, resp. rate 18, height 4\' 11"  (1.499 m), weight 130 lb (59 kg), SpO2 96 %.  LABORATORY DATA: Lab Results  Component Value Date   WBC 6.0 05/18/2018   HGB 13.6 05/18/2018   HCT 39.6 05/18/2018   MCV 92.3 05/18/2018   PLT 224 05/18/2018      Chemistry      Component Value Date/Time   NA 141 05/04/2018 1139   K 4.1 05/04/2018 1139   CL 108 05/04/2018 1139   CO2 22 05/04/2018 1139   BUN 10 05/04/2018 1139   CREATININE 0.81 05/04/2018 1139      Component Value Date/Time   CALCIUM 10.2 05/04/2018 1139   ALKPHOS 104 05/04/2018 1139   AST 19 05/04/2018 1139   ALT 18 05/04/2018 1139   BILITOT 0.5 05/04/2018 1139       RADIOGRAPHIC STUDIES: No results found.  ASSESSMENT AND PLAN: This is a very pleasant 73 years old white female with suspicious for stage IIIA non-small cell lung cancer, adenocarcinoma presented mainly with right lower lobe lung mass in addition to subcarinal lymphadenopathy but there was suspicious metastatic pulmonary nodules. The patient completed a course of concurrent chemoradiation with weekly carboplatin and paclitaxel.  She tolerated her treatment well with no concerning complaints. The patient is currently undergoing consolidation treatment with immunotherapy with Imfinzi (Durvalumab) status post 17 cycles. She continues to tolerate this treatment well. I recommended for her to proceed with cycle #18 today as scheduled. I will see her back for follow-up visit in 2 weeks for evaluation with repeat CT scan of the chest for restaging of her disease. The  patient was advised to call immediately if she has any concerning symptoms in the interval. All questions were answered. The patient knows to call the clinic with any problems, questions or concerns. We can certainly see the patient much sooner if necessary.  Disclaimer: This note was dictated with voice recognition software. Similar sounding words can inadvertently be transcribed and may not be corrected upon review.

## 2018-05-25 ENCOUNTER — Ambulatory Visit (INDEPENDENT_AMBULATORY_CARE_PROVIDER_SITE_OTHER): Payer: PPO | Admitting: Urology

## 2018-05-25 ENCOUNTER — Encounter: Payer: Self-pay | Admitting: Urology

## 2018-05-25 VITALS — BP 122/70 | HR 100 | Ht 59.0 in | Wt 127.8 lb

## 2018-05-25 DIAGNOSIS — Z8551 Personal history of malignant neoplasm of bladder: Secondary | ICD-10-CM

## 2018-05-25 DIAGNOSIS — C679 Malignant neoplasm of bladder, unspecified: Secondary | ICD-10-CM | POA: Diagnosis not present

## 2018-05-25 LAB — URINALYSIS, COMPLETE
Bilirubin, UA: NEGATIVE
Glucose, UA: NEGATIVE
Ketones, UA: NEGATIVE
Leukocytes, UA: NEGATIVE
Nitrite, UA: NEGATIVE
Protein, UA: NEGATIVE
Specific Gravity, UA: 1.01 (ref 1.005–1.030)
Urobilinogen, Ur: 0.2 mg/dL (ref 0.2–1.0)
pH, UA: 6 (ref 5.0–7.5)

## 2018-05-25 MED ORDER — LIDOCAINE HCL URETHRAL/MUCOSAL 2 % EX GEL
1.0000 "application " | Freq: Once | CUTANEOUS | Status: AC
  Administered 2018-05-25: 1 via URETHRAL

## 2018-05-25 NOTE — Patient Instructions (Signed)
Bladder Cancer  Bladder cancer is an abnormal growth of tissue in the bladder. The bladder is the balloon-like sac in the pelvis. It collects and stores urine that comes from the kidneys through the ureters. The bladder wall is made of layers. If cancer spreads into these layers and through the wall of the bladder, it becomes more difficult to treat. What are the causes? The cause of this condition is not known. What increases the risk? The following factors may make you more likely to develop this condition:  Smoking.  Workplace risks (occupational exposures), such as rubber, leather, textile, dyes, chemicals, and paint.  Being white.  Your age. Most people with bladder cancer are over the age of 55.  Being female.  Having chronic bladder inflammation.  Having a personal history of bladder cancer.  Having a family history of bladder cancer (heredity).  Having had chemotherapy or radiation therapy to the pelvis.  Having been exposed to arsenic. What are the signs or symptoms? Initial symptoms of this condition include:  Blood in the urine.  Painful urination.  Frequent bladder or urine infections.  Increase in urgency and frequency of urination. Advanced symptoms of this condition include:  Not being able to urinate.  Low back pain on one side.  Loss of appetite.  Weight loss.  Fatigue.  Swelling in the feet.  Bone pain. How is this diagnosed? This condition is diagnosed based on your medical history, a physical exam, urine tests, lab tests, imaging tests, and your symptoms. You may also have other tests or procedures done, such as:  A narrow tube being inserted into your bladder through your urethra (cystoscopy) in order to view the lining of your bladder for tumors.  A biopsy to sample the tumor to see if cancer is present. If cancer is present, it will then be staged to determine its severity and extent. Staging is an assessment of:  The size of the  tumor.  Whether the cancer has spread.  Where the cancer has spread. It is important to know how deeply into the bladder wall cancer has grown and whether cancer has spread to any other parts of your body. Staging may require blood tests or imaging tests, such as a CT scan, MRI, bone scan, or chest X-ray. How is this treated? Based on the stage of cancer, one treatment or a combination of treatments may be recommended. The most common forms of treatment are:  Surgery to remove the cancer. Procedures that may be done include transurethral resection and cystectomy.  Radiation therapy. This is high-energy X-rays or other particles. This is often used in combination with chemotherapy.  Chemotherapy. During this treatment, medicines are used to kill cancer cells.  Immunotherapy. This uses medicines to help your own immune system destroy cancer cells. Follow these instructions at home:  Take over-the-counter and prescription medicines only as told by your health care provider.  Maintain a healthy diet. Some of your treatments might affect your appetite.  Consider joining a support group. This may help you learn to cope with the stress of having bladder cancer.  Tell your cancer care team if you develop side effects. They may be able to recommend ways to relieve them.  Keep all follow-up visits as told by your health care provider. This is important. Where to find more information  American Cancer Society: www.cancer.org  National Cancer Institute (NCI): www.cancer.gov Contact a health care provider if:  You have symptoms of a urinary tract infection. These include: ?   Fever. ? Chills. ? Weakness. ? Muscle aches. ? Abdominal pain. ? Frequent and intense urge to urinate. ? Burning feeling in the bladder or urethra during urination. Get help right away if:  There is blood in your urine.  You cannot urinate.  You have severe pain or other symptoms that do not go  away. Summary  Bladder cancer is an abnormal growth of tissue in the bladder.  This condition is diagnosed based on your medical history, a physical exam, urine tests, lab tests, imaging tests, and your symptoms.  Based on the stage of cancer, surgery, chemotherapy, or a combination of treatments may be recommended.  Consider joining a support group. This may help you learn to cope with the stress of having bladder cancer. This information is not intended to replace advice given to you by your health care provider. Make sure you discuss any questions you have with your health care provider. Document Released: 04/02/2003 Document Revised: 03/03/2016 Document Reviewed: 03/03/2016 Elsevier Interactive Patient Education  2019 [employer].

## 2018-05-25 NOTE — Progress Notes (Signed)
   05/25/18  CC:  Chief Complaint  Patient presents with  . 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  Post resection intravesical gemcitabine was administered.  Surveillance cystoscopy in May 2019 was negative  Blood pressure 122/70, pulse 100, height 4\' 11"  (1.499 m), weight 127 lb 12.8 oz (58 kg). NED. A&Ox3.   No respiratory distress     Cystoscopy Procedure Note  Patient identification was confirmed, informed consent was obtained, and patient was prepped using Betadine solution.  Lidocaine jelly was administered per urethral meatus.    Procedure: - Flexible cystoscope introduced, without any difficulty.   - Thorough search of the bladder revealed:    normal urethral meatus    normal urothelium    no stones    no ulcers     no tumors    no urethral polyps    no trabeculation  - Ureteral orifices were normal in position and appearance.  Post-Procedure: - Patient tolerated the procedure well  Assessment/ Plan: No evidence of recurrent bladder tumor.  Return in about 1 year (around 05/26/2019) for Cystoscopy.  Abbie Sons, MD

## 2018-05-31 ENCOUNTER — Ambulatory Visit (HOSPITAL_COMMUNITY)
Admission: RE | Admit: 2018-05-31 | Discharge: 2018-05-31 | Disposition: A | Discharge status: Home / Self Care | Payer: PPO | Source: Ambulatory Visit | Attending: Internal Medicine | Admitting: Internal Medicine

## 2018-05-31 ENCOUNTER — Encounter (HOSPITAL_COMMUNITY): Payer: Self-pay

## 2018-05-31 DIAGNOSIS — C3490 Malignant neoplasm of unspecified part of unspecified bronchus or lung: Secondary | ICD-10-CM | POA: Diagnosis not present

## 2018-05-31 DIAGNOSIS — Z5112 Encounter for antineoplastic immunotherapy: Secondary | ICD-10-CM | POA: Diagnosis not present

## 2018-05-31 HISTORY — DX: Malignant (primary) neoplasm, unspecified: C80.1

## 2018-05-31 MED ORDER — IOHEXOL 300 MG/ML  SOLN
75.0000 mL | Freq: Once | INTRAMUSCULAR | Status: AC | PRN
  Administered 2018-05-31: 75 mL via INTRAVENOUS

## 2018-05-31 MED ORDER — SODIUM CHLORIDE (PF) 0.9 % IJ SOLN
INTRAMUSCULAR | Status: AC
  Filled 2018-05-31: qty 50

## 2018-06-01 ENCOUNTER — Inpatient Hospital Stay: Payer: PPO

## 2018-06-01 ENCOUNTER — Inpatient Hospital Stay (HOSPITAL_BASED_OUTPATIENT_CLINIC_OR_DEPARTMENT_OTHER): Payer: PPO | Admitting: Internal Medicine

## 2018-06-01 ENCOUNTER — Encounter: Payer: Self-pay | Admitting: Internal Medicine

## 2018-06-01 VITALS — HR 83

## 2018-06-01 VITALS — BP 134/69 | HR 101 | Temp 98.8°F | Resp 18 | Ht 59.0 in | Wt 130.6 lb

## 2018-06-01 DIAGNOSIS — J439 Emphysema, unspecified: Secondary | ICD-10-CM

## 2018-06-01 DIAGNOSIS — C3431 Malignant neoplasm of lower lobe, right bronchus or lung: Secondary | ICD-10-CM

## 2018-06-01 DIAGNOSIS — I10 Essential (primary) hypertension: Secondary | ICD-10-CM | POA: Diagnosis not present

## 2018-06-01 DIAGNOSIS — E119 Type 2 diabetes mellitus without complications: Secondary | ICD-10-CM

## 2018-06-01 DIAGNOSIS — R918 Other nonspecific abnormal finding of lung field: Secondary | ICD-10-CM

## 2018-06-01 DIAGNOSIS — Z79899 Other long term (current) drug therapy: Secondary | ICD-10-CM

## 2018-06-01 DIAGNOSIS — M129 Arthropathy, unspecified: Secondary | ICD-10-CM

## 2018-06-01 DIAGNOSIS — Z5112 Encounter for antineoplastic immunotherapy: Secondary | ICD-10-CM | POA: Diagnosis not present

## 2018-06-01 LAB — CBC WITH DIFFERENTIAL (CANCER CENTER ONLY)
Abs Immature Granulocytes: 0.03 10*3/uL (ref 0.00–0.07)
Basophils Absolute: 0 10*3/uL (ref 0.0–0.1)
Basophils Relative: 0 %
Eosinophils Absolute: 0.1 10*3/uL (ref 0.0–0.5)
Eosinophils Relative: 2 %
HCT: 39.9 % (ref 36.0–46.0)
Hemoglobin: 13.2 g/dL (ref 12.0–15.0)
Immature Granulocytes: 1 %
Lymphocytes Relative: 15 %
Lymphs Abs: 0.9 10*3/uL (ref 0.7–4.0)
MCH: 31 pg (ref 26.0–34.0)
MCHC: 33.1 g/dL (ref 30.0–36.0)
MCV: 93.7 fL (ref 80.0–100.0)
Monocytes Absolute: 0.5 10*3/uL (ref 0.1–1.0)
Monocytes Relative: 9 %
Neutro Abs: 4.4 10*3/uL (ref 1.7–7.7)
Neutrophils Relative %: 73 %
Platelet Count: 217 10*3/uL (ref 150–400)
RBC: 4.26 MIL/uL (ref 3.87–5.11)
RDW: 12.6 % (ref 11.5–15.5)
WBC Count: 5.9 10*3/uL (ref 4.0–10.5)
nRBC: 0 % (ref 0.0–0.2)

## 2018-06-01 LAB — CMP (CANCER CENTER ONLY)
ALT: 17 U/L (ref 0–44)
AST: 17 U/L (ref 15–41)
Albumin: 4 g/dL (ref 3.5–5.0)
Alkaline Phosphatase: 101 U/L (ref 38–126)
Anion gap: 10 (ref 5–15)
BUN: 15 mg/dL (ref 8–23)
CO2: 25 mmol/L (ref 22–32)
Calcium: 10.2 mg/dL (ref 8.9–10.3)
Chloride: 106 mmol/L (ref 98–111)
Creatinine: 0.85 mg/dL (ref 0.44–1.00)
GFR, Est AFR Am: >60 mL/min (ref >60)
GFR, Estimated: >60 mL/min (ref >60)
Glucose, Bld: 126 mg/dL — ABNORMAL HIGH (ref 70–99)
Potassium: 4.3 mmol/L (ref 3.5–5.1)
Sodium: 141 mmol/L (ref 135–145)
Total Bilirubin: 0.4 mg/dL (ref 0.3–1.2)
Total Protein: 7.1 g/dL (ref 6.5–8.1)

## 2018-06-01 LAB — TSH: TSH: 0.87 u[IU]/mL (ref 0.308–3.960)

## 2018-06-01 MED ORDER — SODIUM CHLORIDE 0.9 % IV SOLN
10.9000 mg/kg | Freq: Once | INTRAVENOUS | Status: AC
  Administered 2018-06-01: 620 mg via INTRAVENOUS
  Filled 2018-06-01: qty 12.4

## 2018-06-01 MED ORDER — SODIUM CHLORIDE 0.9 % IV SOLN
Freq: Once | INTRAVENOUS | Status: AC
  Administered 2018-06-01: 14:00:00 via INTRAVENOUS
  Filled 2018-06-01: qty 250

## 2018-06-01 NOTE — Patient Instructions (Signed)
[facility] Discharge Instructions for Patients Receiving Chemotherapy  Today you received the following chemotherapy agent: Imfinzi.  To help prevent nausea and vomiting after your treatment, we encourage you to take your nausea medication as directed.    If you develop nausea and vomiting that is not controlled by your nausea medication, call the clinic.   BELOW ARE SYMPTOMS THAT SHOULD BE REPORTED IMMEDIATELY:  *FEVER GREATER THAN 100.5 F  *CHILLS WITH OR WITHOUT FEVER  NAUSEA AND VOMITING THAT IS NOT CONTROLLED WITH YOUR NAUSEA MEDICATION  *UNUSUAL SHORTNESS OF BREATH  *UNUSUAL BRUISING OR BLEEDING  TENDERNESS IN MOUTH AND THROAT WITH OR WITHOUT PRESENCE OF ULCERS  *URINARY PROBLEMS  *BOWEL PROBLEMS  UNUSUAL RASH Items with * indicate a potential emergency and should be followed up as soon as possible.  Feel free to call the clinic should you have any questions or concerns. The clinic phone number is [phone].  Please show the [place] at check-in to the Emergency Department and triage nurse.

## 2018-06-01 NOTE — Progress Notes (Signed)
[facility] [phone]   [phone]  OFFICE PROGRESS NOTE  [name], MD [place] [address]  DIAGNOSIS: Stage IIIA (T2a,N2, M0) non-small cell lung cancer,adenocarcinomapresenting with right lower lobe lung mass in addition to subcarinal lymphadenopathy and suspicious metastatic pulmonary nodules diagnosed in February 2019.  PRIOR THERAPY: Concurrent chemoradiation with carboplatin for an AUC of 2 with paclitaxel 45 mg/m weekly. First dose given on 06/28/2017.  Status post 7 cycles.  Last dose was giving August 09, 2017 with partial response.  CURRENT THERAPY: Consolidation immunotherapy with Imfinzi (Durvalumab) 10 mg/KG every 2 weeks, first dose 09/23/2017.  Status post 18 cycles.  INTERVAL HISTORY: Katelyn Kennedy 73 y.o. female returns to the clinic today for follow-up visit.  The patient is feeling fine today with no concerning complaints.  She has been tolerating her treatment with Imfinzi fairly well.  She denied having any current chest pain, shortness of breath, cough or hemoptysis.  She has no nausea, vomiting, diarrhea or constipation.  She denied having any headache or visual changes.  The patient had repeat CT scan of the chest performed recently and she is here for evaluation and discussion of her scan results.  MEDICAL HISTORY: Past Medical History:  Diagnosis Date  . Allergic rhinitis   . Arthritis   . Diabetes mellitus without complication (HCC)    diet control/no meds per pt  . Dyspnea   . History of chicken pox   . lung ca dx'd 04/2017  . Smoker     ALLERGIES:  is allergic to codeine.  MEDICATIONS:  Current Outpatient Medications  Medication Sig Dispense Refill  . acetaminophen (TYLENOL) 500 MG tablet Take 500 mg by mouth daily as needed for moderate pain or headache.    Marland Kitchen atorvastatin (LIPITOR) 20 MG tablet Take 1 tablet (20 mg total) by mouth daily. 90 tablet 3  . lisinopril  (PRINIVIL,ZESTRIL) 5 MG tablet TAKE 1/2 TABLET BY MOUTH ONCE DAILY 45 tablet 1  . Multiple Vitamin (MULTIVITAMIN) capsule Take 1 capsule by mouth daily.      Vladimir Faster Glycol-Propyl Glycol (SYSTANE) 0.4-0.3 % SOLN Apply to eye.    . prochlorperazine (COMPAZINE) 10 MG tablet Take 1 tablet (10 mg total) by mouth every 6 (six) hours as needed for nausea or vomiting. 30 tablet 0  . traZODone (DESYREL) 50 MG tablet Take 0.5-1 tablets (25-50 mg total) by mouth at bedtime as needed for sleep. 30 tablet 3   No current facility-administered medications for this visit.     SURGICAL HISTORY:  Past Surgical History:  Procedure Laterality Date  . ANKLE FRACTURE SURGERY Left 10 years ago   "hard to wake up from anesthesia" per pt  . [place]  . BREAST BIOPSY Right 1978   BENIGN CYST  . BREAST EXCISIONAL BIOPSY Left 2011   Benign  . BREAST EXCISIONAL BIOPSY Right 1985   Benign   . CATARACT EXTRACTION    . CATARACT EXTRACTION W/ INTRAOCULAR LENS IMPLANT Bilateral   . COLONOSCOPY  2011  . ELECTROMAGNETIC NAVIGATION BROCHOSCOPY N/A 06/08/2017   Procedure: ELECTROMAGNETIC NAVIGATION BRONCHOSCOPY;  Surgeon: Flora Lipps, MD;  Location: [facility] ORS;  Service: Cardiopulmonary;  Laterality: N/A;  . POLYPECTOMY  2011  . TRANSURETHRAL RESECTION OF BLADDER TUMOR WITH MITOMYCIN-C N/A 05/04/2017   Procedure: TRANSURETHRAL RESECTION OF BLADDER TUMOR WITH gemcitabine;  Surgeon: Abbie Sons, MD;  Location: [facility] ORS;  Service: Urology;  Laterality:  N/A;  . TUBAL LIGATION      REVIEW OF SYSTEMS:  Constitutional: negative Eyes: negative Ears, nose, mouth, throat, and face: negative Respiratory: negative Cardiovascular: negative Gastrointestinal: negative Genitourinary:negative Integument/breast: negative Hematologic/lymphatic: negative Musculoskeletal:negative Neurological: negative Behavioral/Psych: negative Endocrine: negative Allergic/Immunologic: negative   PHYSICAL  EXAMINATION: General appearance: alert, cooperative and no distress Head: Normocephalic, without obvious abnormality, atraumatic Neck: no adenopathy, no JVD, supple, symmetrical, trachea midline and thyroid not enlarged, symmetric, no tenderness/mass/nodules Lymph nodes: Cervical, supraclavicular, and axillary nodes normal. Resp: clear to auscultation bilaterally Back: symmetric, no curvature. ROM normal. No CVA tenderness. Cardio: regular rate and rhythm, S1, S2 normal, no murmur, click, rub or gallop GI: soft, non-tender; bowel sounds normal; no masses,  no organomegaly Extremities: extremities normal, atraumatic, no cyanosis or edema Neurologic: Alert and oriented X 3, normal strength and tone. Normal symmetric reflexes. Normal coordination and gait  ECOG PERFORMANCE STATUS: 0 - Asymptomatic  Blood pressure 134/69, pulse (!) 101, temperature 98.8 F (37.1 C), temperature source Oral, resp. rate 18, height 4\' 11"  (1.499 m), weight 130 lb 9.6 oz (59.2 kg), SpO2 99 %.  LABORATORY DATA: Lab Results  Component Value Date   WBC 5.9 06/01/2018   HGB 13.2 06/01/2018   HCT 39.9 06/01/2018   MCV 93.7 06/01/2018   PLT 217 06/01/2018      Chemistry      Component Value Date/Time   NA 141 06/01/2018 1126   K 4.3 06/01/2018 1126   CL 106 06/01/2018 1126   CO2 25 06/01/2018 1126   BUN 15 06/01/2018 1126   CREATININE 0.85 06/01/2018 1126      Component Value Date/Time   CALCIUM 10.2 06/01/2018 1126   ALKPHOS 101 06/01/2018 1126   AST 17 06/01/2018 1126   ALT 17 06/01/2018 1126   BILITOT 0.4 06/01/2018 1126       RADIOGRAPHIC STUDIES: Ct Chest W Contrast  Result Date: 05/31/2018 CLINICAL DATA:  Stage IIIA right lower lobe lung adenocarcinoma diagnosed February 2019 status post concurrent chemoradiation therapy with ongoing consolidation immunotherapy. Restaging. EXAM: CT CHEST WITH CONTRAST TECHNIQUE: Multidetector CT imaging of the chest was performed during intravenous contrast  administration. CONTRAST:  71mL OMNIPAQUE IOHEXOL 300 MG/ML  SOLN COMPARISON:  03/07/2018 chest CT. FINDINGS: Cardiovascular: Normal heart size. No significant pericardial effusion/thickening. Left main, left anterior descending and right coronary atherosclerosis. Atherosclerotic nonaneurysmal thoracic aorta. Normal caliber pulmonary arteries. No central pulmonary emboli. Mediastinum/Nodes: Stable bilateral hypodense thyroid nodules, largest 1.5 cm on the left. Unremarkable esophagus. No pathologically enlarged axillary, mediastinal or hilar lymph nodes. Lungs/Pleura: No pneumothorax. No pleural effusion. Mild centrilobular emphysema. Increasingly dense sharply marginated right lower perihilar lung consolidation with associated increased volume loss, distortion and bronchiectasis, compatible with evolving postradiation change. Anterior apical right upper lobe subsolid 1.2 cm pulmonary nodule (series 7/image 28), stable. New tiny 4 mm nodule in the right middle lobe anteriorly (series 7/image 72) correlating with a tiny focus of bronchiectasis on the prior scan, favoring tiny focus of mucoid impaction. Additional scattered subcentimeter solid pulmonary nodules, largest 7 mm in the right lower lobe (series 7/image 99), all stable. No additional new significant pulmonary nodules. Upper abdomen: Small hiatal hernia. Small low-attenuation lesions in the lateral segment left liver lobe and posterior right liver lobe unchanged since 05/26/2017 PET-CT, where they were non hypermetabolic. Musculoskeletal: No aggressive appearing focal osseous lesions. Moderate thoracic spondylosis. IMPRESSION: 1. Continued evolution of postradiation change in the perihilar lower right lung, with no findings to suggest local tumor recurrence. 2.  No evidence of new or progressive metastatic disease in the chest. Scattered right pulmonary nodules are stable, including the previously newly identified apical right upper lobe 1.2 cm sub solid  pulmonary nodule. Aortic Atherosclerosis (ICD10-I70.0) and Emphysema (ICD10-J43.9). Electronically Signed   By: Ilona Sorrel M.D.   On: 05/31/2018 15:16    ASSESSMENT AND PLAN: This is a very pleasant 73 years old white female with suspicious for stage IIIA non-small cell lung cancer, adenocarcinoma presented mainly with right lower lobe lung mass in addition to subcarinal lymphadenopathy but there was suspicious metastatic pulmonary nodules. The patient completed a course of concurrent chemoradiation with weekly carboplatin and paclitaxel.  She tolerated her treatment well with no concerning complaints. The patient is currently undergoing consolidation treatment with immunotherapy with Imfinzi (Durvalumab) status post 18 cycles. The patient has been tolerating this treatment well with no concerning adverse effects. She had repeat CT scan of the chest performed recently.  I personally and independently reviewed the scans and discussed the results with the patient today. Her scan showed no concerning findings for disease progression. I recommended for the patient to continue her current treatment with Imfinzi and she will proceed with cycle #19 today. For hypertension she will continue on lisinopril. We will continue to monitor her thyroid function closely during her treatment with immunotherapy. The patient will come back for follow-up visit in 2 weeks for evaluation before starting cycle #20. She was advised to call immediately if she has any concerning symptoms in the interval. All questions were answered. The patient knows to call the clinic with any problems, questions or concerns. We can certainly see the patient much sooner if necessary.  Disclaimer: This note was dictated with voice recognition software. Similar sounding words can inadvertently be transcribed and may not be corrected upon review.

## 2018-06-15 ENCOUNTER — Inpatient Hospital Stay: Payer: PPO

## 2018-06-15 ENCOUNTER — Inpatient Hospital Stay (HOSPITAL_BASED_OUTPATIENT_CLINIC_OR_DEPARTMENT_OTHER): Payer: PPO | Admitting: Physician Assistant

## 2018-06-15 ENCOUNTER — Telehealth: Payer: Self-pay | Admitting: Physician Assistant

## 2018-06-15 ENCOUNTER — Other Ambulatory Visit: Payer: Self-pay

## 2018-06-15 ENCOUNTER — Inpatient Hospital Stay: Payer: PPO | Attending: Internal Medicine

## 2018-06-15 VITALS — BP 118/60 | HR 80 | Temp 98.4°F | Resp 16 | Wt 131.4 lb

## 2018-06-15 DIAGNOSIS — Z5112 Encounter for antineoplastic immunotherapy: Secondary | ICD-10-CM | POA: Diagnosis not present

## 2018-06-15 DIAGNOSIS — J439 Emphysema, unspecified: Secondary | ICD-10-CM | POA: Insufficient documentation

## 2018-06-15 DIAGNOSIS — K449 Diaphragmatic hernia without obstruction or gangrene: Secondary | ICD-10-CM

## 2018-06-15 DIAGNOSIS — Z9221 Personal history of antineoplastic chemotherapy: Secondary | ICD-10-CM | POA: Insufficient documentation

## 2018-06-15 DIAGNOSIS — I700 Atherosclerosis of aorta: Secondary | ICD-10-CM | POA: Insufficient documentation

## 2018-06-15 DIAGNOSIS — E119 Type 2 diabetes mellitus without complications: Secondary | ICD-10-CM | POA: Diagnosis not present

## 2018-06-15 DIAGNOSIS — Z79899 Other long term (current) drug therapy: Secondary | ICD-10-CM

## 2018-06-15 DIAGNOSIS — Z923 Personal history of irradiation: Secondary | ICD-10-CM | POA: Insufficient documentation

## 2018-06-15 DIAGNOSIS — C771 Secondary and unspecified malignant neoplasm of intrathoracic lymph nodes: Secondary | ICD-10-CM

## 2018-06-15 DIAGNOSIS — C3431 Malignant neoplasm of lower lobe, right bronchus or lung: Secondary | ICD-10-CM

## 2018-06-15 DIAGNOSIS — E042 Nontoxic multinodular goiter: Secondary | ICD-10-CM | POA: Insufficient documentation

## 2018-06-15 LAB — CMP (CANCER CENTER ONLY)
ALT: 14 U/L (ref 0–44)
AST: 17 U/L (ref 15–41)
Albumin: 3.8 g/dL (ref 3.5–5.0)
Alkaline Phosphatase: 102 U/L (ref 38–126)
Anion gap: 13 (ref 5–15)
BUN: 14 mg/dL (ref 8–23)
CO2: 20 mmol/L — ABNORMAL LOW (ref 22–32)
Calcium: 9.6 mg/dL (ref 8.9–10.3)
Chloride: 108 mmol/L (ref 98–111)
Creatinine: 0.83 mg/dL (ref 0.44–1.00)
GFR, Est AFR Am: >60 mL/min (ref >60)
GFR, Estimated: >60 mL/min (ref >60)
Glucose, Bld: 96 mg/dL (ref 70–99)
Potassium: 4.3 mmol/L (ref 3.5–5.1)
Sodium: 141 mmol/L (ref 135–145)
Total Bilirubin: 0.3 mg/dL (ref 0.3–1.2)
Total Protein: 7 g/dL (ref 6.5–8.1)

## 2018-06-15 LAB — CBC WITH DIFFERENTIAL (CANCER CENTER ONLY)
Abs Immature Granulocytes: 0.02 10*3/uL (ref 0.00–0.07)
Basophils Absolute: 0 10*3/uL (ref 0.0–0.1)
Basophils Relative: 0 %
Eosinophils Absolute: 0.1 10*3/uL (ref 0.0–0.5)
Eosinophils Relative: 1 %
HCT: 40.3 % (ref 36.0–46.0)
Hemoglobin: 13.4 g/dL (ref 12.0–15.0)
Immature Granulocytes: 0 %
Lymphocytes Relative: 18 %
Lymphs Abs: 1.1 10*3/uL (ref 0.7–4.0)
MCH: 31.2 pg (ref 26.0–34.0)
MCHC: 33.3 g/dL (ref 30.0–36.0)
MCV: 93.9 fL (ref 80.0–100.0)
Monocytes Absolute: 0.6 10*3/uL (ref 0.1–1.0)
Monocytes Relative: 10 %
Neutro Abs: 4.1 10*3/uL (ref 1.7–7.7)
Neutrophils Relative %: 71 %
Platelet Count: 226 10*3/uL (ref 150–400)
RBC: 4.29 MIL/uL (ref 3.87–5.11)
RDW: 12.7 % (ref 11.5–15.5)
WBC Count: 5.8 10*3/uL (ref 4.0–10.5)
nRBC: 0 % (ref 0.0–0.2)

## 2018-06-15 MED ORDER — SODIUM CHLORIDE 0.9 % IV SOLN
10.8000 mg/kg | Freq: Once | INTRAVENOUS | Status: AC
  Administered 2018-06-15: 620 mg via INTRAVENOUS
  Filled 2018-06-15: qty 2.4

## 2018-06-15 MED ORDER — SODIUM CHLORIDE 0.9 % IV SOLN
Freq: Once | INTRAVENOUS | Status: AC
  Administered 2018-06-15: 11:00:00 via INTRAVENOUS
  Filled 2018-06-15: qty 250

## 2018-06-15 NOTE — Progress Notes (Signed)
[facility] OFFICE PROGRESS NOTE  Katelyn Sanders, MD [place] [address]  DIAGNOSIS: Stage IIIA (T2a,N2, M0) non-small cell lung cancer,adenocarcinomapresenting with right lower lobe lung mass in addition to subcarinal lymphadenopathy and suspicious metastatic pulmonary nodules diagnosed in February 2019.  PRIOR THERAPY: Concurrent chemoradiation with carboplatin for an AUC of 2 with paclitaxel 45 mg/m weekly. First dosegiven on3/18/2019.  Status post 7 cycles.  Last dose was giving August 09, 2017 with partial response.  CURRENT THERAPY: Consolidation immunotherapy with Imfinzi (Durvalumab) 10 mg/KG every 2 weeks, first dose 09/23/2017.  Status post 20 cycles.  INTERVAL HISTORY: Katelyn Kennedy 73 y.o. female returns to the clinic today for a follow-up visit.  The patient is feeling well topday without any concerning complaints.  She continues to tolerate her Imfinzi fairly well without any adverse side effects.  She denies any nausea, vomiting, diarrhea, or constipation.  She denies any fevers, chills, night sweats, or weight loss. She denies any chest pain, shortness of breath, cough, or hemoptysis.  She denies any headaches or visual changes.  She denies any rashes or skin changes. She is here today for evaluation prior to starting cycle #20.  MEDICAL HISTORY: Past Medical History:  Diagnosis Date  . Allergic rhinitis   . Arthritis   . Diabetes mellitus without complication (HCC)    diet control/no meds per pt  . Dyspnea   . History of chicken pox   . lung ca dx'd 04/2017  . Smoker     ALLERGIES:  is allergic to codeine.  MEDICATIONS:  Current Outpatient Medications  Medication Sig Dispense Refill  . acetaminophen (TYLENOL) 500 MG tablet Take 500 mg by mouth daily as needed for moderate pain or headache.    Marland Kitchen atorvastatin (LIPITOR) 20 MG tablet Take 1 tablet (20 mg total) by mouth daily. 90 tablet 3  . lisinopril (PRINIVIL,ZESTRIL) 5 MG  tablet TAKE 1/2 TABLET BY MOUTH ONCE DAILY 45 tablet 1  . Multiple Vitamin (MULTIVITAMIN) capsule Take 1 capsule by mouth daily.      Katelyn Kennedy Glycol-Propyl Glycol (SYSTANE) 0.4-0.3 % SOLN Apply to eye.    . prochlorperazine (COMPAZINE) 10 MG tablet Take 1 tablet (10 mg total) by mouth every 6 (six) hours as needed for nausea or vomiting. 30 tablet 0  . traZODone (DESYREL) 50 MG tablet Take 0.5-1 tablets (25-50 mg total) by mouth at bedtime as needed for sleep. 30 tablet 3   No current facility-administered medications for this visit.     SURGICAL HISTORY:  Past Surgical History:  Procedure Laterality Date  . ANKLE FRACTURE SURGERY Left 10 years ago   "hard to wake up from anesthesia" per pt  . [place]  . BREAST BIOPSY Right 1978   BENIGN CYST  . BREAST EXCISIONAL BIOPSY Left 2011   Benign  . BREAST EXCISIONAL BIOPSY Right 1985   Benign   . CATARACT EXTRACTION    . CATARACT EXTRACTION W/ INTRAOCULAR LENS IMPLANT Bilateral   . COLONOSCOPY  2011  . ELECTROMAGNETIC NAVIGATION BROCHOSCOPY N/A 06/08/2017   Procedure: ELECTROMAGNETIC NAVIGATION BRONCHOSCOPY;  Surgeon: Flora Lipps, MD;  Location: [facility] ORS;  Service: Cardiopulmonary;  Laterality: N/A;  . POLYPECTOMY  2011  . 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 TUBAL LIGATION  REVIEW OF SYSTEMS:   Review of Systems  Constitutional: Negative for appetite change, chills, fatigue, fever and unexpected weight change.  HENT:   Negative for mouth sores, nosebleeds, sore throat and trouble swallowing.   Eyes: Negative for eye problems and icterus.  Respiratory: Negative for cough, hemoptysis, shortness of breath and wheezing.   Cardiovascular: Negative for chest pain and leg swelling.  Gastrointestinal: Negative for  abdominal pain, constipation, diarrhea, nausea and vomiting.  Genitourinary: Negative for bladder incontinence, difficulty urinating, dysuria, frequency and hematuria.   Musculoskeletal: Negative for back pain, gait problem, neck pain and neck stiffness.  Skin: Negative for itching and rash.  Neurological: Negative for dizziness, extremity weakness, gait problem, headaches, light-headedness and seizures.  Hematological: Negative for adenopathy. Does not bruise/bleed easily.  Psychiatric/Behavioral: Negative for confusion, depression and sleep disturbance. The patient is not nervous/anxious.     PHYSICAL EXAMINATION:  There were no vitals taken for this visit.  ECOG PERFORMANCE STATUS: 0 - Asymptomatic  Physical Exam  Constitutional: Oriented to person, place, and time and well-developed, well-nourished, and in no distress. No distress.  HENT:  Head: Normocephalic and atraumatic.  Mouth/Throat: Oropharynx is clear and moist. No oropharyngeal exudate.  Eyes: Conjunctivae are normal. Right eye exhibits no discharge. Left eye exhibits no discharge. No scleral icterus.  Neck: Normal range of motion. Neck supple.  Cardiovascular: Normal rate, regular rhythm, normal heart sounds and intact distal pulses.   Pulmonary/Chest: Effort normal and breath sounds normal. No respiratory distress. No wheezes. No rales.  Abdominal: Soft. Bowel sounds are normal. Exhibits no distension and no mass. There is no tenderness.  Musculoskeletal: Normal range of motion. Exhibits no edema.  Lymphadenopathy:    No cervical adenopathy.  Neurological: Alert and oriented to person, place, and time. Exhibits normal muscle tone. Gait normal. Coordination normal.  Skin: Skin is warm and dry. No rash noted. Not diaphoretic. No erythema. No pallor.  Psychiatric: Mood, memory and judgment normal.  Vitals reviewed.  LABORATORY DATA: Lab Results  Component Value Date   WBC 5.8 06/15/2018   HGB 13.4 06/15/2018   HCT  40.3 06/15/2018   MCV 93.9 06/15/2018   PLT 226 06/15/2018      Chemistry      Component Value Date/Time   NA 141 06/01/2018 1126   K 4.3 06/01/2018 1126   CL 106 06/01/2018 1126   CO2 25 06/01/2018 1126   BUN 15 06/01/2018 1126   CREATININE 0.85 06/01/2018 1126      Component Value Date/Time   CALCIUM 10.2 06/01/2018 1126   ALKPHOS 101 06/01/2018 1126   AST 17 06/01/2018 1126   ALT 17 06/01/2018 1126   BILITOT 0.4 06/01/2018 1126       RADIOGRAPHIC STUDIES:  Ct Chest W Contrast  Result Date: 05/31/2018 CLINICAL DATA:  Stage IIIA right lower lobe lung adenocarcinoma diagnosed February 2019 status post concurrent chemoradiation therapy with ongoing consolidation immunotherapy. Restaging. EXAM: CT CHEST WITH CONTRAST TECHNIQUE: Multidetector CT imaging of the chest was performed during intravenous contrast administration. CONTRAST:  13mL OMNIPAQUE IOHEXOL 300 MG/ML  SOLN COMPARISON:  03/07/2018 chest CT. FINDINGS: Cardiovascular: Normal heart size. No significant pericardial effusion/thickening. Left main, left anterior descending and right coronary atherosclerosis. Atherosclerotic nonaneurysmal thoracic aorta. Normal caliber pulmonary arteries. No central pulmonary emboli. Mediastinum/Nodes: Stable bilateral hypodense thyroid nodules, largest 1.5 cm on the left. Unremarkable esophagus. No pathologically enlarged axillary, mediastinal or hilar lymph nodes. Lungs/Pleura: No pneumothorax. No pleural effusion. Mild centrilobular emphysema. Increasingly dense sharply marginated right  lower perihilar lung consolidation with associated increased volume loss, distortion and bronchiectasis, compatible with evolving postradiation change. Anterior apical right upper lobe subsolid 1.2 cm pulmonary nodule (series 7/image 28), stable. New tiny 4 mm nodule in the right middle lobe anteriorly (series 7/image 72) correlating with a tiny focus of bronchiectasis on the prior scan, favoring tiny focus of  mucoid impaction. Additional scattered subcentimeter solid pulmonary nodules, largest 7 mm in the right lower lobe (series 7/image 99), all stable. No additional new significant pulmonary nodules. Upper abdomen: Small hiatal hernia. Small low-attenuation lesions in the lateral segment left liver lobe and posterior right liver lobe unchanged since 05/26/2017 PET-CT, where they were non hypermetabolic. Musculoskeletal: No aggressive appearing focal osseous lesions. Moderate thoracic spondylosis. IMPRESSION: 1. Continued evolution of postradiation change in the perihilar lower right lung, with no findings to suggest local tumor recurrence. 2. No evidence of new or progressive metastatic disease in the chest. Scattered right pulmonary nodules are stable, including the previously newly identified apical right upper lobe 1.2 cm sub solid pulmonary nodule. Aortic Atherosclerosis (ICD10-I70.0) and Emphysema (ICD10-J43.9). Electronically Signed   By: Ilona Sorrel M.D.   On: 05/31/2018 15:16     ASSESSMENT/PLAN:  This is a very pleasant 73 year old Caucasian female with stage IIIa non-small cell lung cancer, adenocarcinoma who presented with a right lower lobe mass and subcarinal lymphadenopathy. She additionally had pulmonary nodules suspicious for metastatic disease in February 2019.  She completed a course of concurrent chemoradiation with weekly carboplatin for an AUC of 2 and paclitaxel 45 mg/m.  She tolerated treatment well with a partial response. She is currently undergoing consolidation immunotherapy with Imfinzi.  She is status post 19 cycles.  She is tolerating treatment well with no concerning adverse effects.  Patient was seen with Dr. Julien Nordmann today.  Labs were reviewed with the patient.  Recommend she proceed with cycle #20 today scheduled.  I will see the patient back in 2 weeks for evaluation prior to starting cycle #21. The patient was advised to call immediately if she has any concerning symptoms  in the interval. The patient voices understanding of current disease status and treatment options and is in agreement with the current care plan. All questions were answered. The patient knows to call the clinic with any problems, questions or concerns. We can certainly see the patient much sooner if necessary  yNo orders of the defined types were placed in this encounter.    Katelyn Kolodziejski L Ayeshia Coppin, PA-C 06/15/18  ADDENDUM: Hematology/Oncology Attending: I had a face-to-face encounter with the patient today.  I recommended her care plan.  This is a very pleasant 73 years old African-American female with a stage IIIa non-small cell lung cancer status post a course of concurrent chemoradiation with weekly carboplatin and paclitaxel with partial response.  She is currently on consolidation treatment with immunotherapy with Imfinzi status post 19 cycles.  The patient has been tolerating this treatment well with no concerning adverse effects. I recommended for her to proceed with cycle #20 today as scheduled. She will come back for follow-up visit in 2 weeks for evaluation before the next cycle of her treatment. The patient was advised to call if she has any concerning symptoms.   Disclaimer: This note was dictated with voice recognition software. Similar sounding words can inadvertently be transcribed and may be missed upon review. Eilleen Kempf, MD 06/15/18

## 2018-06-15 NOTE — Patient Instructions (Signed)
[facility] Discharge Instructions for Patients Receiving Chemotherapy  Today you received the following chemotherapy agent: Imfinzi.  To help prevent nausea and vomiting after your treatment, we encourage you to take your nausea medication as directed.    If you develop nausea and vomiting that is not controlled by your nausea medication, call the clinic.   BELOW ARE SYMPTOMS THAT SHOULD BE REPORTED IMMEDIATELY:  *FEVER GREATER THAN 100.5 F  *CHILLS WITH OR WITHOUT FEVER  NAUSEA AND VOMITING THAT IS NOT CONTROLLED WITH YOUR NAUSEA MEDICATION  *UNUSUAL SHORTNESS OF BREATH  *UNUSUAL BRUISING OR BLEEDING  TENDERNESS IN MOUTH AND THROAT WITH OR WITHOUT PRESENCE OF ULCERS  *URINARY PROBLEMS  *BOWEL PROBLEMS  UNUSUAL RASH Items with * indicate a potential emergency and should be followed up as soon as possible.  Feel free to call the clinic should you have any questions or concerns. The clinic phone number is [phone].  Please show the [facility] at check-in to the Emergency Department and triage nurse.

## 2018-06-15 NOTE — Telephone Encounter (Signed)
Scheduled appt per 3/4 los - pt to get an updated sch next visit.

## 2018-06-29 ENCOUNTER — Inpatient Hospital Stay: Payer: PPO

## 2018-06-29 ENCOUNTER — Encounter: Payer: Self-pay | Admitting: Internal Medicine

## 2018-06-29 ENCOUNTER — Other Ambulatory Visit: Payer: Self-pay | Admitting: Internal Medicine

## 2018-06-29 ENCOUNTER — Inpatient Hospital Stay (HOSPITAL_BASED_OUTPATIENT_CLINIC_OR_DEPARTMENT_OTHER): Payer: PPO | Admitting: Internal Medicine

## 2018-06-29 ENCOUNTER — Other Ambulatory Visit: Payer: Self-pay

## 2018-06-29 VITALS — BP 120/63 | HR 98 | Temp 98.4°F | Resp 18 | Ht 59.0 in | Wt 130.1 lb

## 2018-06-29 DIAGNOSIS — Z923 Personal history of irradiation: Secondary | ICD-10-CM | POA: Diagnosis not present

## 2018-06-29 DIAGNOSIS — E119 Type 2 diabetes mellitus without complications: Secondary | ICD-10-CM

## 2018-06-29 DIAGNOSIS — C3431 Malignant neoplasm of lower lobe, right bronchus or lung: Secondary | ICD-10-CM

## 2018-06-29 DIAGNOSIS — K449 Diaphragmatic hernia without obstruction or gangrene: Secondary | ICD-10-CM

## 2018-06-29 DIAGNOSIS — C771 Secondary and unspecified malignant neoplasm of intrathoracic lymph nodes: Secondary | ICD-10-CM

## 2018-06-29 DIAGNOSIS — J439 Emphysema, unspecified: Secondary | ICD-10-CM | POA: Diagnosis not present

## 2018-06-29 DIAGNOSIS — Z9221 Personal history of antineoplastic chemotherapy: Secondary | ICD-10-CM

## 2018-06-29 DIAGNOSIS — I700 Atherosclerosis of aorta: Secondary | ICD-10-CM | POA: Diagnosis not present

## 2018-06-29 DIAGNOSIS — E042 Nontoxic multinodular goiter: Secondary | ICD-10-CM | POA: Diagnosis not present

## 2018-06-29 DIAGNOSIS — Z79899 Other long term (current) drug therapy: Secondary | ICD-10-CM | POA: Diagnosis not present

## 2018-06-29 DIAGNOSIS — Z5112 Encounter for antineoplastic immunotherapy: Secondary | ICD-10-CM | POA: Diagnosis not present

## 2018-06-29 LAB — CBC WITH DIFFERENTIAL (CANCER CENTER ONLY)
Abs Immature Granulocytes: 0.01 10*3/uL (ref 0.00–0.07)
Basophils Absolute: 0 10*3/uL (ref 0.0–0.1)
Basophils Relative: 1 %
Eosinophils Absolute: 0.1 10*3/uL (ref 0.0–0.5)
Eosinophils Relative: 1 %
HCT: 42.8 % (ref 36.0–46.0)
Hemoglobin: 14.4 g/dL (ref 12.0–15.0)
Immature Granulocytes: 0 %
Lymphocytes Relative: 19 %
Lymphs Abs: 1.1 10*3/uL (ref 0.7–4.0)
MCH: 31.4 pg (ref 26.0–34.0)
MCHC: 33.6 g/dL (ref 30.0–36.0)
MCV: 93.2 fL (ref 80.0–100.0)
Monocytes Absolute: 0.7 10*3/uL (ref 0.1–1.0)
Monocytes Relative: 12 %
Neutro Abs: 3.8 10*3/uL (ref 1.7–7.7)
Neutrophils Relative %: 67 %
Platelet Count: 239 10*3/uL (ref 150–400)
RBC: 4.59 MIL/uL (ref 3.87–5.11)
RDW: 12.6 % (ref 11.5–15.5)
WBC Count: 5.6 10*3/uL (ref 4.0–10.5)
nRBC: 0 % (ref 0.0–0.2)

## 2018-06-29 LAB — CMP (CANCER CENTER ONLY)
ALT: 19 U/L (ref 0–44)
AST: 18 U/L (ref 15–41)
Albumin: 4.1 g/dL (ref 3.5–5.0)
Alkaline Phosphatase: 88 U/L (ref 38–126)
Anion gap: 13 (ref 5–15)
BUN: 17 mg/dL (ref 8–23)
CO2: 19 mmol/L — ABNORMAL LOW (ref 22–32)
Calcium: 10 mg/dL (ref 8.9–10.3)
Chloride: 108 mmol/L (ref 98–111)
Creatinine: 0.84 mg/dL (ref 0.44–1.00)
GFR, Est AFR Am: >60 mL/min (ref >60)
GFR, Estimated: >60 mL/min (ref >60)
Glucose, Bld: 110 mg/dL — ABNORMAL HIGH (ref 70–99)
Potassium: 4.1 mmol/L (ref 3.5–5.1)
Sodium: 140 mmol/L (ref 135–145)
Total Bilirubin: 0.6 mg/dL (ref 0.3–1.2)
Total Protein: 7.4 g/dL (ref 6.5–8.1)

## 2018-06-29 LAB — TSH: TSH: 4.178 u[IU]/mL — ABNORMAL HIGH (ref 0.308–3.960)

## 2018-06-29 MED ORDER — SODIUM CHLORIDE 0.9 % IV SOLN
Freq: Once | INTRAVENOUS | Status: AC
  Administered 2018-06-29: 10:00:00 via INTRAVENOUS
  Filled 2018-06-29: qty 250

## 2018-06-29 MED ORDER — SODIUM CHLORIDE 0.9 % IV SOLN
10.8000 mg/kg | Freq: Once | INTRAVENOUS | Status: AC
  Administered 2018-06-29: 620 mg via INTRAVENOUS
  Filled 2018-06-29: qty 2.4

## 2018-06-29 NOTE — Progress Notes (Signed)
[place] [phone]   Fax:(336) [phone]  OFFICE PROGRESS NOTE  [name], MD [place] [address]  DIAGNOSIS: Stage IIIA (T2a,N2, M0) non-small cell lung cancer,adenocarcinomapresenting with right lower lobe lung mass in addition to subcarinal lymphadenopathy and suspicious metastatic pulmonary nodules diagnosed in February 2019.  PRIOR THERAPY: Concurrent chemoradiation with carboplatin for an AUC of 2 with paclitaxel 45 mg/m weekly. First dose given on 06/28/2017.  Status post 7 cycles.  Last dose was giving August 09, 2017 with partial response.  CURRENT THERAPY: Consolidation immunotherapy with Imfinzi (Durvalumab) 10 mg/KG every 2 weeks, first dose 09/23/2017.  Status post 20 cycles.  INTERVAL HISTORY: Katelyn Kennedy 74 y.o. female returns to the clinic today for follow-up visit.  The patient is feeling fine today with no concerning complaints.  She denied having any chest pain, shortness of breath, cough or hemoptysis.  She denied having any fever or chills.  She has no nausea, vomiting, diarrhea or constipation.  She denied having any headache or visual changes.  She continues to tolerate her treatment with Imfinzi fairly well.  MEDICAL HISTORY: Past Medical History:  Diagnosis Date  . Allergic rhinitis   . Arthritis   . Diabetes mellitus without complication (HCC)    diet control/no meds per pt  . Dyspnea   . History of chicken pox   . lung ca dx'd 04/2017  . Smoker     ALLERGIES:  is allergic to codeine.  MEDICATIONS:  Current Outpatient Medications  Medication Sig Dispense Refill  . acetaminophen (TYLENOL) 500 MG tablet Take 500 mg by mouth daily as needed for moderate pain or headache.    Marland Kitchen atorvastatin (LIPITOR) 20 MG tablet Take 1 tablet (20 mg total) by mouth daily. 90 tablet 3  . lisinopril (PRINIVIL,ZESTRIL) 5 MG tablet TAKE 1/2 TABLET BY MOUTH ONCE DAILY 45 tablet 1  . Multiple Vitamin  (MULTIVITAMIN) capsule Take 1 capsule by mouth daily.      Vladimir Faster Glycol-Propyl Glycol (SYSTANE) 0.4-0.3 % SOLN Apply to eye.    . prochlorperazine (COMPAZINE) 10 MG tablet Take 1 tablet (10 mg total) by mouth every 6 (six) hours as needed for nausea or vomiting. 30 tablet 0  . traZODone (DESYREL) 50 MG tablet Take 0.5-1 tablets (25-50 mg total) by mouth at bedtime as needed for sleep. 30 tablet 3   No current facility-administered medications for this visit.     SURGICAL HISTORY:  Past Surgical History:  Procedure Laterality Date  . ANKLE FRACTURE SURGERY Left 10 years ago   "hard to wake up from anesthesia" per pt  . [place]  . BREAST BIOPSY Right 1978   BENIGN CYST  . BREAST EXCISIONAL BIOPSY Left 2011   Benign  . BREAST EXCISIONAL BIOPSY Right 1985   Benign   . CATARACT EXTRACTION    . CATARACT EXTRACTION W/ INTRAOCULAR LENS IMPLANT Bilateral   . COLONOSCOPY  2011  . ELECTROMAGNETIC NAVIGATION BROCHOSCOPY N/A 06/08/2017   Procedure: ELECTROMAGNETIC NAVIGATION BRONCHOSCOPY;  Surgeon: Flora Lipps, MD;  Location: [facility] ORS;  Service: Cardiopulmonary;  Laterality: N/A;  . POLYPECTOMY  2011  . 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 TUBAL LIGATION      REVIEW OF SYSTEMS:  A comprehensive review  of systems was negative.   PHYSICAL EXAMINATION: General appearance: alert, cooperative and no distress Head: Normocephalic, without obvious abnormality, atraumatic Neck: no adenopathy, no JVD, supple, symmetrical, trachea midline and thyroid not enlarged, symmetric, no tenderness/mass/nodules Lymph nodes: Cervical, supraclavicular, and axillary nodes normal. Resp: clear to auscultation bilaterally Back: symmetric, no curvature. ROM normal. No CVA tenderness. Cardio:  regular rate and rhythm, S1, S2 normal, no murmur, click, rub or gallop GI: soft, non-tender; bowel sounds normal; no masses,  no organomegaly Extremities: extremities normal, atraumatic, no cyanosis or edema  ECOG PERFORMANCE STATUS: 0 - Asymptomatic  Blood pressure 120/63, pulse 98, temperature 98.4 F (36.9 C), temperature source Oral, resp. rate 18, height 4\' 11"  (1.499 m), weight 130 lb 1.6 oz (59 kg), SpO2 97 %.  LABORATORY DATA: Lab Results  Component Value Date   WBC 5.6 06/29/2018   HGB 14.4 06/29/2018   HCT 42.8 06/29/2018   MCV 93.2 06/29/2018   PLT 239 06/29/2018      Chemistry      Component Value Date/Time   NA 141 06/15/2018 0914   K 4.3 06/15/2018 0914   CL 108 06/15/2018 0914   CO2 20 (L) 06/15/2018 0914   BUN 14 06/15/2018 0914   CREATININE 0.83 06/15/2018 0914      Component Value Date/Time   CALCIUM 9.6 06/15/2018 0914   ALKPHOS 102 06/15/2018 0914   AST 17 06/15/2018 0914   ALT 14 06/15/2018 0914   BILITOT 0.3 06/15/2018 0914       RADIOGRAPHIC STUDIES: Ct Chest W Contrast  Result Date: 05/31/2018 CLINICAL DATA:  Stage IIIA right lower lobe lung adenocarcinoma diagnosed February 2019 status post concurrent chemoradiation therapy with ongoing consolidation immunotherapy. Restaging. EXAM: CT CHEST WITH CONTRAST TECHNIQUE: Multidetector CT imaging of the chest was performed during intravenous contrast administration. CONTRAST:  6mL OMNIPAQUE IOHEXOL 300 MG/ML  SOLN COMPARISON:  03/07/2018 chest CT. FINDINGS: Cardiovascular: Normal heart size. No significant pericardial effusion/thickening. Left main, left anterior descending and right coronary atherosclerosis. Atherosclerotic nonaneurysmal thoracic aorta. Normal caliber pulmonary arteries. No central pulmonary emboli. Mediastinum/Nodes: Stable bilateral hypodense thyroid nodules, largest 1.5 cm on the left. Unremarkable esophagus. No pathologically enlarged axillary, mediastinal or hilar lymph nodes.  Lungs/Pleura: No pneumothorax. No pleural effusion. Mild centrilobular emphysema. Increasingly dense sharply marginated right lower perihilar lung consolidation with associated increased volume loss, distortion and bronchiectasis, compatible with evolving postradiation change. Anterior apical right upper lobe subsolid 1.2 cm pulmonary nodule (series 7/image 28), stable. New tiny 4 mm nodule in the right middle lobe anteriorly (series 7/image 72) correlating with a tiny focus of bronchiectasis on the prior scan, favoring tiny focus of mucoid impaction. Additional scattered subcentimeter solid pulmonary nodules, largest 7 mm in the right lower lobe (series 7/image 99), all stable. No additional new significant pulmonary nodules. Upper abdomen: Small hiatal hernia. Small low-attenuation lesions in the lateral segment left liver lobe and posterior right liver lobe unchanged since 05/26/2017 PET-CT, where they were non hypermetabolic. Musculoskeletal: No aggressive appearing focal osseous lesions. Moderate thoracic spondylosis. IMPRESSION: 1. Continued evolution of postradiation change in the perihilar lower right lung, with no findings to suggest local tumor recurrence. 2. No evidence of new or progressive metastatic disease in the chest. Scattered right pulmonary nodules are stable, including the previously newly identified apical right upper lobe 1.2 cm sub solid pulmonary nodule. Aortic Atherosclerosis (ICD10-I70.0) and Emphysema (ICD10-J43.9). Electronically Signed   By: Ilona Sorrel M.D.   On: 05/31/2018 15:16    ASSESSMENT AND PLAN:  This is a very pleasant 73 years old white female with suspicious for stage IIIA non-small cell lung cancer, adenocarcinoma presented mainly with right lower lobe lung mass in addition to subcarinal lymphadenopathy but there was suspicious metastatic pulmonary nodules. The patient completed a course of concurrent chemoradiation with weekly carboplatin and paclitaxel.  She tolerated  her treatment well with no concerning complaints. The patient is currently undergoing consolidation treatment with immunotherapy with Imfinzi (Durvalumab) status post 20 cycles. The patient continues to tolerate this treatment well with no concerning adverse effects. I recommended for the patient to continue with cycle #21 today as scheduled. She will come back for follow-up visit in 2 weeks for evaluation before the next cycle of her treatment. The patient was advised to call immediately if she has any concerning symptoms in the interval. All questions were answered. The patient knows to call the clinic with any problems, questions or concerns. We can certainly see the patient much sooner if necessary.  Disclaimer: This note was dictated with voice recognition software. Similar sounding words can inadvertently be transcribed and may not be corrected upon review.

## 2018-06-29 NOTE — Patient Instructions (Signed)
[facility] Discharge Instructions for Patients Receiving Chemotherapy  Today you received the following chemotherapy agent: Imfinzi.  To help prevent nausea and vomiting after your treatment, we encourage you to take your nausea medication as directed.    If you develop nausea and vomiting that is not controlled by your nausea medication, call the clinic.   BELOW ARE SYMPTOMS THAT SHOULD BE REPORTED IMMEDIATELY:  *FEVER GREATER THAN 100.5 F  *CHILLS WITH OR WITHOUT FEVER  NAUSEA AND VOMITING THAT IS NOT CONTROLLED WITH YOUR NAUSEA MEDICATION  *UNUSUAL SHORTNESS OF BREATH  *UNUSUAL BRUISING OR BLEEDING  TENDERNESS IN MOUTH AND THROAT WITH OR WITHOUT PRESENCE OF ULCERS  *URINARY PROBLEMS  *BOWEL PROBLEMS  UNUSUAL RASH Items with * indicate a potential emergency and should be followed up as soon as possible.  Feel free to call the clinic should you have any questions or concerns. The clinic phone number is [phone].  Please show the [facility] at check-in to the Emergency Department and triage nurse.

## 2018-07-13 ENCOUNTER — Encounter: Payer: Self-pay | Admitting: Internal Medicine

## 2018-07-13 ENCOUNTER — Inpatient Hospital Stay (HOSPITAL_BASED_OUTPATIENT_CLINIC_OR_DEPARTMENT_OTHER): Payer: PPO | Admitting: Internal Medicine

## 2018-07-13 ENCOUNTER — Inpatient Hospital Stay: Payer: PPO | Attending: Internal Medicine

## 2018-07-13 ENCOUNTER — Other Ambulatory Visit: Payer: Self-pay

## 2018-07-13 ENCOUNTER — Inpatient Hospital Stay: Payer: PPO

## 2018-07-13 VITALS — BP 110/70 | HR 94 | Temp 98.1°F | Resp 18 | Ht 59.0 in | Wt 130.4 lb

## 2018-07-13 DIAGNOSIS — E119 Type 2 diabetes mellitus without complications: Secondary | ICD-10-CM

## 2018-07-13 DIAGNOSIS — Z5112 Encounter for antineoplastic immunotherapy: Secondary | ICD-10-CM | POA: Diagnosis not present

## 2018-07-13 DIAGNOSIS — F17210 Nicotine dependence, cigarettes, uncomplicated: Secondary | ICD-10-CM | POA: Diagnosis not present

## 2018-07-13 DIAGNOSIS — R0602 Shortness of breath: Secondary | ICD-10-CM

## 2018-07-13 DIAGNOSIS — C3431 Malignant neoplasm of lower lobe, right bronchus or lung: Secondary | ICD-10-CM

## 2018-07-13 DIAGNOSIS — E291 Testicular hypofunction: Secondary | ICD-10-CM | POA: Diagnosis not present

## 2018-07-13 DIAGNOSIS — Z79899 Other long term (current) drug therapy: Secondary | ICD-10-CM | POA: Insufficient documentation

## 2018-07-13 DIAGNOSIS — M129 Arthropathy, unspecified: Secondary | ICD-10-CM

## 2018-07-13 DIAGNOSIS — G4700 Insomnia, unspecified: Secondary | ICD-10-CM | POA: Diagnosis not present

## 2018-07-13 LAB — CMP (CANCER CENTER ONLY)
ALT: 20 U/L (ref 0–44)
AST: 19 U/L (ref 15–41)
Albumin: 4.1 g/dL (ref 3.5–5.0)
Alkaline Phosphatase: 90 U/L (ref 38–126)
Anion gap: 10 (ref 5–15)
BUN: 8 mg/dL (ref 8–23)
CO2: 22 mmol/L (ref 22–32)
Calcium: 9.9 mg/dL (ref 8.9–10.3)
Chloride: 109 mmol/L (ref 98–111)
Creatinine: 0.84 mg/dL (ref 0.44–1.00)
GFR, Est AFR Am: >60 mL/min (ref >60)
GFR, Estimated: >60 mL/min (ref >60)
Glucose, Bld: 106 mg/dL — ABNORMAL HIGH (ref 70–99)
Potassium: 4.3 mmol/L (ref 3.5–5.1)
Sodium: 141 mmol/L (ref 135–145)
Total Bilirubin: 0.4 mg/dL (ref 0.3–1.2)
Total Protein: 7.5 g/dL (ref 6.5–8.1)

## 2018-07-13 LAB — CBC WITH DIFFERENTIAL (CANCER CENTER ONLY)
Abs Immature Granulocytes: 0.02 10*3/uL (ref 0.00–0.07)
Basophils Absolute: 0 10*3/uL (ref 0.0–0.1)
Basophils Relative: 0 %
Eosinophils Absolute: 0.1 10*3/uL (ref 0.0–0.5)
Eosinophils Relative: 1 %
HCT: 41.7 % (ref 36.0–46.0)
Hemoglobin: 14.2 g/dL (ref 12.0–15.0)
Immature Granulocytes: 0 %
Lymphocytes Relative: 18 %
Lymphs Abs: 1 10*3/uL (ref 0.7–4.0)
MCH: 31.8 pg (ref 26.0–34.0)
MCHC: 34.1 g/dL (ref 30.0–36.0)
MCV: 93.5 fL (ref 80.0–100.0)
Monocytes Absolute: 0.5 10*3/uL (ref 0.1–1.0)
Monocytes Relative: 10 %
Neutro Abs: 3.7 10*3/uL (ref 1.7–7.7)
Neutrophils Relative %: 71 %
Platelet Count: 222 10*3/uL (ref 150–400)
RBC: 4.46 MIL/uL (ref 3.87–5.11)
RDW: 12.4 % (ref 11.5–15.5)
WBC Count: 5.3 10*3/uL (ref 4.0–10.5)
nRBC: 0 % (ref 0.0–0.2)

## 2018-07-13 MED ORDER — SODIUM CHLORIDE 0.9 % IV SOLN
Freq: Once | INTRAVENOUS | Status: AC
  Administered 2018-07-13: 11:00:00 via INTRAVENOUS
  Filled 2018-07-13: qty 250

## 2018-07-13 MED ORDER — SODIUM CHLORIDE 0.9 % IV SOLN
620.0000 mg | Freq: Once | INTRAVENOUS | Status: AC
  Administered 2018-07-13: 620 mg via INTRAVENOUS
  Filled 2018-07-13: qty 10

## 2018-07-13 NOTE — Patient Instructions (Signed)
[facility] Discharge Instructions for Patients Receiving Chemotherapy  Today you received the following chemotherapy agent: Imfinzi.  To help prevent nausea and vomiting after your treatment, we encourage you to take your nausea medication as directed.    If you develop nausea and vomiting that is not controlled by your nausea medication, call the clinic.   BELOW ARE SYMPTOMS THAT SHOULD BE REPORTED IMMEDIATELY:  *FEVER GREATER THAN 100.5 F  *CHILLS WITH OR WITHOUT FEVER  NAUSEA AND VOMITING THAT IS NOT CONTROLLED WITH YOUR NAUSEA MEDICATION  *UNUSUAL SHORTNESS OF BREATH  *UNUSUAL BRUISING OR BLEEDING  TENDERNESS IN MOUTH AND THROAT WITH OR WITHOUT PRESENCE OF ULCERS  *URINARY PROBLEMS  *BOWEL PROBLEMS  UNUSUAL RASH Items with * indicate a potential emergency and should be followed up as soon as possible.  Feel free to call the clinic should you have any questions or concerns. The clinic phone number is [phone].  Please show the [facility] at check-in to the Emergency Department and triage nurse.  Coronavirus (COVID-19) Are you at risk?  Are you at risk for the Coronavirus (COVID-19)?  To be considered HIGH RISK for Coronavirus (COVID-19), you have to meet the following criteria:  . Traveled to [place], [place], [place], [place] or [place]; or in the [place] to [place], [place], [place], or [place]; and have fever, cough, and shortness of breath within the last 2 weeks of travel OR . Been in close contact with a person diagnosed with COVID-19 within the last 2 weeks and have fever, cough, and shortness of breath . IF YOU DO NOT MEET THESE CRITERIA, YOU ARE CONSIDERED LOW RISK FOR COVID-19.  What to do if you are HIGH RISK for COVID-19?  Marland Kitchen If you are having a medical emergency, call 911. . Seek medical care right away. Before you go to a doctor's office, urgent care or emergency department, call ahead and tell them about your  recent travel, contact with someone diagnosed with COVID-19, and your symptoms. You should receive instructions from your physician's office regarding next steps of care.  . When you arrive at healthcare provider, tell the healthcare staff immediately you have returned from visiting [place], [place], [place], [place] or [place]; or traveled in the [place] to [place], [place], [place], or [place]; in the last two weeks or you have been in close contact with a person diagnosed with COVID-19 in the last 2 weeks.   . Tell the health care staff about your symptoms: fever, cough and shortness of breath. . After you have been seen by a medical provider, you will be either: o Tested for (COVID-19) and discharged home on quarantine except to seek medical care if symptoms worsen, and asked to  - Stay home and avoid contact with others until you get your results (4-5 days)  - Avoid travel on public transportation if possible (such as bus, train, or airplane) or o Sent to the Emergency Department by EMS for evaluation, COVID-19 testing, and possible admission depending on your condition and test results.  What to do if you are LOW RISK for COVID-19?  Reduce your risk of any infection by using the same precautions used for avoiding the common cold or flu:  Marland Kitchen Wash your hands often with soap and warm water for at least 20 seconds.  If soap and water are not readily available, use an alcohol-based hand sanitizer with at least 60% alcohol.  . If coughing or  sneezing, cover your mouth and nose by coughing or sneezing into the elbow areas of your shirt or coat, into a tissue or into your sleeve (not your hands). . Avoid shaking hands with others and consider head nods or verbal greetings only. . Avoid touching your eyes, nose, or mouth with unwashed hands.  . Avoid close contact with people who are sick. . Avoid places or events with large numbers of people in one location, like concerts or sporting  events. . Carefully consider travel plans you have or are making. . If you are planning any travel outside or inside the [name], visit the CDC's Travelers' Health webpage for the latest health notices. . If you have some symptoms but not all symptoms, continue to monitor at home and seek medical attention if your symptoms worsen. . If you are having a medical emergency, call 911.   [place] / e-Visit: [email or web address]         [facility]: [facility]: [phone]                   [facility]: [phone]

## 2018-07-13 NOTE — Progress Notes (Signed)
[place] Telephone:([phone]   Fax:([phone]  OFFICE PROGRESS NOTE  [name], MD [place] [address]  DIAGNOSIS: Stage IIIA (T2a,N2, M0) non-small cell lung cancer,adenocarcinomapresenting with right lower lobe lung mass in addition to subcarinal lymphadenopathy and suspicious metastatic pulmonary nodules diagnosed in February 2019.  PRIOR THERAPY: Concurrent chemoradiation with carboplatin for an AUC of 2 with paclitaxel 45 mg/m weekly. First dose given on 06/28/2017.  Status post 7 cycles.  Last dose was giving August 09, 2017 with partial response.  CURRENT THERAPY: Consolidation immunotherapy with Imfinzi (Durvalumab) 10 mg/KG every 2 weeks, first dose 09/23/2017.  Status post 21 cycles.  INTERVAL HISTORY: Katelyn Kennedy 73 y.o. female returns to the clinic today for follow-up visit.  The patient is feeling fine today with no concerning complaints.  She denied having any fever or chills.  She has no nausea, vomiting, diarrhea or constipation.  She denied having any chest pain, shortness breath, cough or hemoptysis.  She denied having any headache or visual changes.  She continues to tolerate her treatment fairly well.  She is here for evaluation before starting cycle #22.   MEDICAL HISTORY: Past Medical History:  Diagnosis Date  . Allergic rhinitis   . Arthritis   . Diabetes mellitus without complication (HCC)    diet control/no meds per pt  . Dyspnea   . History of chicken pox   . lung ca dx'd 04/2017  . Smoker     ALLERGIES:  is allergic to codeine.  MEDICATIONS:  Current Outpatient Medications  Medication Sig Dispense Refill  . acetaminophen (TYLENOL) 500 MG tablet Take 500 mg by mouth daily as needed for moderate pain or headache.    Marland Kitchen atorvastatin (LIPITOR) 20 MG tablet Take 1 tablet (20 mg total) by mouth daily. 90 tablet 3  . lisinopril (PRINIVIL,ZESTRIL) 5 MG tablet TAKE 1/2 TABLET BY MOUTH ONCE DAILY  45 tablet 1  . Multiple Vitamin (MULTIVITAMIN) capsule Take 1 capsule by mouth daily.      Vladimir Faster Glycol-Propyl Glycol (SYSTANE) 0.4-0.3 % SOLN Apply to eye.    . prochlorperazine (COMPAZINE) 10 MG tablet Take 1 tablet (10 mg total) by mouth every 6 (six) hours as needed for nausea or vomiting. 30 tablet 0  . traZODone (DESYREL) 50 MG tablet Take 0.5-1 tablets (25-50 mg total) by mouth at bedtime as needed for sleep. 30 tablet 3   No current facility-administered medications for this visit.     SURGICAL HISTORY:  Past Surgical History:  Procedure Laterality Date  . ANKLE FRACTURE SURGERY Left 10 years ago   "hard to wake up from anesthesia" per pt  . [place]  . BREAST BIOPSY Right 1978   BENIGN CYST  . BREAST EXCISIONAL BIOPSY Left 2011   Benign  . BREAST EXCISIONAL BIOPSY Right 1985   Benign   . CATARACT EXTRACTION    . CATARACT EXTRACTION W/ INTRAOCULAR LENS IMPLANT Bilateral   . COLONOSCOPY  2011  . ELECTROMAGNETIC NAVIGATION BROCHOSCOPY N/A 06/08/2017   Procedure: ELECTROMAGNETIC NAVIGATION BRONCHOSCOPY;  Surgeon: Flora Lipps, MD;  Location: [facility] ORS;  Service: Cardiopulmonary;  Laterality: N/A;  . POLYPECTOMY  2011  . 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 TUBAL LIGATION  REVIEW OF SYSTEMS:  A comprehensive review of systems was negative.   PHYSICAL EXAMINATION: General appearance: alert, cooperative and no distress Head: Normocephalic, without obvious abnormality, atraumatic Neck: no adenopathy, no JVD, supple, symmetrical, trachea midline and thyroid not enlarged, symmetric, no tenderness/mass/nodules Lymph nodes: Cervical, supraclavicular, and axillary nodes normal. Resp: clear to auscultation bilaterally Back: symmetric, no curvature. ROM  normal. No CVA tenderness. Cardio: regular rate and rhythm, S1, S2 normal, no murmur, click, rub or gallop GI: soft, non-tender; bowel sounds normal; no masses,  no organomegaly Extremities: extremities normal, atraumatic, no cyanosis or edema  ECOG PERFORMANCE STATUS: 0 - Asymptomatic  Blood pressure 110/70, pulse 94, temperature 98.1 F (36.7 C), temperature source Oral, resp. rate 18, height 4\' 11"  (1.499 m), weight 130 lb 6.4 oz (59.1 kg), SpO2 97 %.  LABORATORY DATA: Lab Results  Component Value Date   WBC 5.3 07/13/2018   HGB 14.2 07/13/2018   HCT 41.7 07/13/2018   MCV 93.5 07/13/2018   PLT 222 07/13/2018      Chemistry      Component Value Date/Time   NA 141 07/13/2018 0957   K 4.3 07/13/2018 0957   CL 109 07/13/2018 0957   CO2 22 07/13/2018 0957   BUN 8 07/13/2018 0957   CREATININE 0.84 07/13/2018 0957      Component Value Date/Time   CALCIUM 9.9 07/13/2018 0957   ALKPHOS 90 07/13/2018 0957   AST 19 07/13/2018 0957   ALT 20 07/13/2018 0957   BILITOT 0.4 07/13/2018 0957       RADIOGRAPHIC STUDIES: No results found.  ASSESSMENT AND PLAN: This is a very pleasant 73 years old white female with suspicious for stage IIIA non-small cell lung cancer, adenocarcinoma presented mainly with right lower lobe lung mass in addition to subcarinal lymphadenopathy but there was suspicious metastatic pulmonary nodules. The patient completed a course of concurrent chemoradiation with weekly carboplatin and paclitaxel.  She tolerated her treatment well with no concerning complaints. The patient is currently undergoing consolidation treatment with immunotherapy with Imfinzi (Durvalumab) status post 21 cycles. The patient has been tolerating this treatment well with no concerning adverse effects. I recommended for her to proceed with cycle #22 today as scheduled. She will come back for follow-up visit in 2 weeks for evaluation before starting cycle #23. She was advised to call  immediately if she has any concerning symptoms in the interval. All questions were answered. The patient knows to call the clinic with any problems, questions or concerns. We can certainly see the patient much sooner if necessary.  Disclaimer: This note was dictated with voice recognition software. Similar sounding words can inadvertently be transcribed and may not be corrected upon review.

## 2018-07-14 ENCOUNTER — Telehealth: Payer: Self-pay | Admitting: Internal Medicine

## 2018-07-14 NOTE — Telephone Encounter (Signed)
Tried to reach phone kept disconnecting. Need schedule

## 2018-07-15 ENCOUNTER — Other Ambulatory Visit: Payer: Self-pay | Admitting: Family Medicine

## 2018-07-27 ENCOUNTER — Other Ambulatory Visit: Payer: Self-pay

## 2018-07-27 ENCOUNTER — Encounter: Payer: Self-pay | Admitting: Physician Assistant

## 2018-07-27 ENCOUNTER — Inpatient Hospital Stay: Payer: PPO

## 2018-07-27 ENCOUNTER — Inpatient Hospital Stay (HOSPITAL_BASED_OUTPATIENT_CLINIC_OR_DEPARTMENT_OTHER): Payer: PPO | Admitting: Physician Assistant

## 2018-07-27 VITALS — BP 108/63 | HR 96 | Temp 98.3°F | Resp 18 | Ht 59.0 in | Wt 131.5 lb

## 2018-07-27 DIAGNOSIS — R0602 Shortness of breath: Secondary | ICD-10-CM | POA: Diagnosis not present

## 2018-07-27 DIAGNOSIS — F17210 Nicotine dependence, cigarettes, uncomplicated: Secondary | ICD-10-CM | POA: Diagnosis not present

## 2018-07-27 DIAGNOSIS — Z79899 Other long term (current) drug therapy: Secondary | ICD-10-CM | POA: Diagnosis not present

## 2018-07-27 DIAGNOSIS — E119 Type 2 diabetes mellitus without complications: Secondary | ICD-10-CM | POA: Diagnosis not present

## 2018-07-27 DIAGNOSIS — C3431 Malignant neoplasm of lower lobe, right bronchus or lung: Secondary | ICD-10-CM | POA: Diagnosis not present

## 2018-07-27 DIAGNOSIS — M129 Arthropathy, unspecified: Secondary | ICD-10-CM | POA: Diagnosis not present

## 2018-07-27 DIAGNOSIS — Z5112 Encounter for antineoplastic immunotherapy: Secondary | ICD-10-CM

## 2018-07-27 LAB — CBC WITH DIFFERENTIAL (CANCER CENTER ONLY)
Abs Immature Granulocytes: 0.03 10*3/uL (ref 0.00–0.07)
Basophils Absolute: 0 10*3/uL (ref 0.0–0.1)
Basophils Relative: 0 %
Eosinophils Absolute: 0.1 10*3/uL (ref 0.0–0.5)
Eosinophils Relative: 1 %
HCT: 41.6 % (ref 36.0–46.0)
Hemoglobin: 13.7 g/dL (ref 12.0–15.0)
Immature Granulocytes: 1 %
Lymphocytes Relative: 18 %
Lymphs Abs: 1.1 10*3/uL (ref 0.7–4.0)
MCH: 31.1 pg (ref 26.0–34.0)
MCHC: 32.9 g/dL (ref 30.0–36.0)
MCV: 94.5 fL (ref 80.0–100.0)
Monocytes Absolute: 0.5 10*3/uL (ref 0.1–1.0)
Monocytes Relative: 8 %
Neutro Abs: 4.3 10*3/uL (ref 1.7–7.7)
Neutrophils Relative %: 72 %
Platelet Count: 239 10*3/uL (ref 150–400)
RBC: 4.4 MIL/uL (ref 3.87–5.11)
RDW: 12.6 % (ref 11.5–15.5)
WBC Count: 6 10*3/uL (ref 4.0–10.5)
nRBC: 0 % (ref 0.0–0.2)

## 2018-07-27 LAB — CMP (CANCER CENTER ONLY)
ALT: 17 U/L (ref 0–44)
AST: 18 U/L (ref 15–41)
Albumin: 4 g/dL (ref 3.5–5.0)
Alkaline Phosphatase: 94 U/L (ref 38–126)
Anion gap: 13 (ref 5–15)
BUN: 15 mg/dL (ref 8–23)
CO2: 19 mmol/L — ABNORMAL LOW (ref 22–32)
Calcium: 9.8 mg/dL (ref 8.9–10.3)
Chloride: 109 mmol/L (ref 98–111)
Creatinine: 0.85 mg/dL (ref 0.44–1.00)
GFR, Est AFR Am: >60 mL/min (ref >60)
GFR, Estimated: >60 mL/min (ref >60)
Glucose, Bld: 120 mg/dL — ABNORMAL HIGH (ref 70–99)
Potassium: 4.3 mmol/L (ref 3.5–5.1)
Sodium: 141 mmol/L (ref 135–145)
Total Bilirubin: 0.5 mg/dL (ref 0.3–1.2)
Total Protein: 7.3 g/dL (ref 6.5–8.1)

## 2018-07-27 LAB — TSH: TSH: 2.095 u[IU]/mL (ref 0.308–3.960)

## 2018-07-27 MED ORDER — SODIUM CHLORIDE 0.9 % IV SOLN
10.8000 mg/kg | Freq: Once | INTRAVENOUS | Status: AC
  Administered 2018-07-27: 620 mg via INTRAVENOUS
  Filled 2018-07-27: qty 2.4

## 2018-07-27 MED ORDER — SODIUM CHLORIDE 0.9 % IV SOLN
Freq: Once | INTRAVENOUS | Status: AC
  Administered 2018-07-27: 10:00:00 via INTRAVENOUS
  Filled 2018-07-27: qty 250

## 2018-07-27 NOTE — Progress Notes (Signed)
[name]  Katelyn Sanders, MD [place] [address]  DIAGNOSIS: Stage IIIA (T2a,N2, M0) non-small cell lung cancer,adenocarcinomapresenting with right lower lobe lung mass in addition to subcarinal lymphadenopathy and suspicious metastatic pulmonary nodules diagnosed in February 2019.  PRIOR THERAPY: Concurrent chemoradiation with carboplatin for an AUC of 2 with paclitaxel 45 mg/m weekly. First dosegiven on3/18/2019.  Status post 7 cycles.  Last dose was giving August 09, 2017 with partial response.  CURRENT THERAPY: Consolidation immunotherapy with Imfinzi (Durvalumab) 10 mg/KG every 2 weeks, first dose 09/23/2017.  Status post 22 cycles.  INTERVAL HISTORY: Katelyn Kennedy 73 y.o. female returns to the clinic today for a follow-up visit.  The patient is feeling well today without any concerning complaints.  She continues to tolerate her Imfinzi well without any adverse effects.  She denies any fever, chills, night sweats, or weight loss.  She denies any chest pain, shortness of breath, cough, or hemoptysis.  She denies any nausea, vomiting, diarrhea, or constipation.  She denies any headache or visual changes.  She denies any rashes or skin changes.  She is here today for evaluation prior to starting cycle #23.  MEDICAL HISTORY: Past Medical History:  Diagnosis Date  . Allergic rhinitis   . Arthritis   . Diabetes mellitus without complication (HCC)    diet control/no meds per pt  . Dyspnea   . History of chicken pox   . lung ca dx'd 04/2017  . Smoker     ALLERGIES:  is allergic to codeine.  MEDICATIONS:  Current Outpatient Medications  Medication Sig Dispense Refill  . acetaminophen (TYLENOL) 500 MG tablet Take 500 mg by mouth daily as needed for moderate pain or headache.    Marland Kitchen atorvastatin (LIPITOR) 20 MG tablet Take 1 tablet (20 mg total) by mouth daily. 90 tablet 3  . lisinopril (PRINIVIL,ZESTRIL) 5 MG tablet TAKE  1/2 TABLET BY MOUTH ONCE DAILY 45 tablet 1  . Multiple Vitamin (MULTIVITAMIN) capsule Take 1 capsule by mouth daily.      Katelyn Kennedy Glycol-Propyl Glycol (SYSTANE) 0.4-0.3 % SOLN Apply to eye.    . prochlorperazine (COMPAZINE) 10 MG tablet Take 1 tablet (10 mg total) by mouth every 6 (six) hours as needed for nausea or vomiting. 30 tablet 0  . traZODone (DESYREL) 50 MG tablet TAKE 1/2 TO 1 TABLET(25 TO 50 MG) BY MOUTH AT BEDTIME AS NEEDED FOR SLEEP 90 tablet 0   No current facility-administered medications for this visit.     SURGICAL HISTORY:  Past Surgical History:  Procedure Laterality Date  . ANKLE FRACTURE SURGERY Left 10 years ago   "hard to wake up from anesthesia" per pt  . [place]  . BREAST BIOPSY Right 1978   BENIGN CYST  . BREAST EXCISIONAL BIOPSY Left 2011   Benign  . BREAST EXCISIONAL BIOPSY Right 1985   Benign   . CATARACT EXTRACTION    . CATARACT EXTRACTION W/ INTRAOCULAR LENS IMPLANT Bilateral   . COLONOSCOPY  2011  . ELECTROMAGNETIC NAVIGATION BROCHOSCOPY N/A 06/08/2017   Procedure: ELECTROMAGNETIC NAVIGATION BRONCHOSCOPY;  Surgeon: Flora Lipps, MD;  Location: [facility] ORS;  Service: Cardiopulmonary;  Laterality: N/A;  . POLYPECTOMY  2011  . 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 TUBAL LIGATION  REVIEW OF SYSTEMS:   Review of Systems  Constitutional: Negative for appetite change, chills, fatigue, fever and unexpected weight change.  HENT:   Negative for mouth sores, nosebleeds, sore throat and trouble swallowing.   Eyes: Negative for eye problems and icterus.  Respiratory: Negative for cough, hemoptysis, shortness of breath and wheezing.   Cardiovascular: Negative for chest pain and leg swelling.  Gastrointestinal: Negative for abdominal pain,  constipation, diarrhea, nausea and vomiting.  Genitourinary: Negative for bladder incontinence, difficulty urinating, dysuria, frequency and hematuria.   Musculoskeletal: Negative for back pain, gait problem, neck pain and neck stiffness.  Skin: Negative for itching and rash.  Neurological: Negative for dizziness, extremity weakness, gait problem, headaches, light-headedness and seizures.  Hematological: Negative for adenopathy. Does not bruise/bleed easily.  Psychiatric/Behavioral: Negative for confusion, depression and sleep disturbance. The patient is not nervous/anxious.     PHYSICAL EXAMINATION:  Blood pressure 108/63, pulse 96, temperature 98.3 F (36.8 C), temperature source Oral, resp. rate 18, height 4\' 11"  (1.499 m), weight 131 lb 8 oz (59.6 kg), SpO2 97 %.  ECOG PERFORMANCE STATUS: 1 - Symptomatic but completely ambulatory  Physical Exam  Constitutional: Oriented to person, place, and time and well-developed, well-nourished, and in no distress. No distress.  HENT:  Head: Normocephalic and atraumatic.  Mouth/Throat: Oropharynx is clear and moist. No oropharyngeal exudate.  Eyes: Conjunctivae are normal. Right eye exhibits no discharge. Left eye exhibits no discharge. No scleral icterus.  Neck: Normal range of motion. Neck supple.  Cardiovascular: Normal rate, regular rhythm, normal heart sounds and intact distal pulses.   Pulmonary/Chest: Effort normal and breath sounds normal. No respiratory distress. No wheezes. No rales.  Abdominal: Soft. Bowel sounds are normal. Exhibits no distension and no mass. There is no tenderness.  Musculoskeletal: Normal range of motion. Exhibits no edema.  Lymphadenopathy:    No cervical adenopathy.  Neurological: Alert and oriented to person, place, and time. Exhibits normal muscle tone. Gait normal. Coordination normal.  Skin: Skin is warm and dry. No rash noted. Not diaphoretic. No erythema. No pallor.  Psychiatric: Mood, memory and judgment  normal.  Vitals reviewed.  LABORATORY DATA: Lab Results  Component Value Date   WBC 6.0 07/27/2018   HGB 13.7 07/27/2018   HCT 41.6 07/27/2018   MCV 94.5 07/27/2018   PLT 239 07/27/2018      Chemistry      Component Value Date/Time   NA 141 07/27/2018 0858   K 4.3 07/27/2018 0858   CL 109 07/27/2018 0858   CO2 19 (L) 07/27/2018 0858   BUN 15 07/27/2018 0858   CREATININE 0.85 07/27/2018 0858      Component Value Date/Time   CALCIUM 9.8 07/27/2018 0858   ALKPHOS 94 07/27/2018 0858   AST 18 07/27/2018 0858   ALT 17 07/27/2018 0858   BILITOT 0.5 07/27/2018 0858       RADIOGRAPHIC STUDIES:  No results found.   ASSESSMENT/PLAN:  This is a very pleasant 73 year old Caucasian female with stage IIIa non-small cell lung cancer, adenocarcinoma.  She presented with a right lower lobe lung mass in addition to subcarinal lymphadenopathy and pulmonary nodules which are suspicious for metastatic disease.  She was diagnosed in February 2019.   She completed a course of concurrent chemoradiation with weekly carboplatin for an AUC of 2 and paclitaxel 45 mg/m.  She tolerated treatment well with a partial response.  She is currently undergoing consolidation immunotherapy with Imfinzi 10 mg/kg IV every 2 weeks.  She is status  post 22 cycles.  She is tolerating treatment well without any concerning adverse effects.  The patient was seen with Dr. Julien Nordmann today.  Labs were reviewed with the patient.  We recommend she proceed with cycle #23 today as scheduled.  We will see her back for a follow-up visit in 2 weeks for evaluation prior to starting cycle #24.  The patient was advised to call immediately if she has any concerning symptoms in the interval. The patient voices understanding of current disease status and treatment options and is in agreement with the current care plan. All questions were answered. The patient knows to call the clinic with any problems, questions or concerns. We can  certainly see the patient much sooner if necessary   Orders Placed This Encounter  Procedures  . TSH    Standing Status:   Future    Standing Expiration Date:   07/27/2019     Cassandra L Heilingoetter, PA-C 07/27/18  ADDENDUM: Hematology/Oncology Attending: I had a face-to-face encounter with the patient today.  I recommended her care plan.  This is a very pleasant 73 years old white female with a stage IIIb non-small cell lung cancer, adenocarcinoma status post concurrent chemoradiation with carboplatin and paclitaxel with partial response. She is currently undergoing consolidation treatment with Imfinzi status post 22 cycles and has been tolerating this treatment well with no concerning adverse effects. I recommended for the patient to proceed with cycle #23 today as scheduled. We will see her back for follow-up visit in 2 weeks for evaluation before the next cycle of her treatment. She was advised to call immediately if she has any concerning symptoms in the interval.  Disclaimer: This note was dictated with voice recognition software. Similar sounding words can inadvertently be transcribed and may be missed upon review. Eilleen Kempf, MD 07/27/18

## 2018-07-27 NOTE — Patient Instructions (Signed)
Coronavirus (COVID-19) Are you at risk?  Are you at risk for the Coronavirus (COVID-19)?  To be considered HIGH RISK for Coronavirus (COVID-19), you have to meet the following criteria:  . Traveled to [place], [place], [name], [place] or [place]; or in the [place] to [place], [place], [place], or [place]; and have fever, cough, and shortness of breath within the last 2 weeks of travel OR . Been in close contact with a person diagnosed with COVID-19 within the last 2 weeks and have fever, cough, and shortness of breath . IF YOU DO NOT MEET THESE CRITERIA, YOU ARE CONSIDERED LOW RISK FOR COVID-19.  What to do if you are HIGH RISK for COVID-19?  . If you are having a medical emergency, call 911. . Seek medical care right away. Before you go to a doctor's office, urgent care or emergency department, call ahead and tell them about your recent travel, contact with someone diagnosed with COVID-19, and your symptoms. You should receive instructions from your physician's office regarding next steps of care.  . When you arrive at healthcare provider, tell the healthcare staff immediately you have returned from visiting [place], [place], [place], [place] or [name]; or traveled in the [place] to [place], [place], [place], or [place]; in the last two weeks or you have been in close contact with a person diagnosed with COVID-19 in the last 2 weeks.   . Tell the health care staff about your symptoms: fever, cough and shortness of breath. . After you have been seen by a medical provider, you will be either: o Tested for (COVID-19) and discharged home on quarantine except to seek medical care if symptoms worsen, and asked to  - Stay home and avoid contact with others until you get your results (4-5 days)  - Avoid travel on public transportation if possible (such as bus, train, or airplane) or o Sent to the Emergency Department by EMS for evaluation, COVID-19 testing, and possible  admission depending on your condition and test results.  What to do if you are LOW RISK for COVID-19?  Reduce your risk of any infection by using the same precautions used for avoiding the common cold or flu:  . Wash your hands often with soap and warm water for at least 20 seconds.  If soap and water are not readily available, use an alcohol-based hand sanitizer with at least 60% alcohol.  . If coughing or sneezing, cover your mouth and nose by coughing or sneezing into the elbow areas of your shirt or coat, into a tissue or into your sleeve (not your hands). . Avoid shaking hands with others and consider head nods or verbal greetings only. . Avoid touching your eyes, nose, or mouth with unwashed hands.  . Avoid close contact with people who are sick. . Avoid places or events with large numbers of people in one location, like concerts or sporting events. . Carefully consider travel plans you have or are making. . If you are planning any travel outside or inside the [place], visit the CDC's Travelers' Health webpage for the latest health notices. . If you have some symptoms but not all symptoms, continue to monitor at home and seek medical attention if your symptoms worsen. . If you are having a medical emergency, call 911.   ADDITIONAL HEALTHCARE OPTIONS FOR PATIENTS  [facility] / e-Visit: [email or web address]         [facility]: [phone]  [facility]   Urgent [facility]: [facility]: [facility] Discharge Instructions for Patients Receiving Chemotherapy  Today you received the following chemotherapy agents Imfinzi  To help prevent nausea and vomiting after your treatment, we encourage you to take your nausea medication as directed.    If you develop nausea and vomiting that is not controlled by your nausea medication, call the clinic.   BELOW ARE  SYMPTOMS THAT SHOULD BE REPORTED IMMEDIATELY:  *FEVER GREATER THAN 100.5 F  *CHILLS WITH OR WITHOUT FEVER  NAUSEA AND VOMITING THAT IS NOT CONTROLLED WITH YOUR NAUSEA MEDICATION  *UNUSUAL SHORTNESS OF BREATH  *UNUSUAL BRUISING OR BLEEDING  TENDERNESS IN MOUTH AND THROAT WITH OR WITHOUT PRESENCE OF ULCERS  *URINARY PROBLEMS  *BOWEL PROBLEMS  UNUSUAL RASH Items with * indicate a potential emergency and should be followed up as soon as possible.  Feel free to call the clinic should you have any questions or concerns. The clinic phone number is [phone].  Please show the [facility] at check-in to the Emergency Department and triage nurse.

## 2018-07-29 ENCOUNTER — Telehealth: Payer: Self-pay | Admitting: Physician Assistant

## 2018-07-29 NOTE — Telephone Encounter (Signed)
R/s apt from 4/29 to 4/30 - unable to reach patient . Left message with appt date and time

## 2018-08-10 ENCOUNTER — Other Ambulatory Visit: Payer: PPO

## 2018-08-10 ENCOUNTER — Ambulatory Visit: Payer: PPO | Admitting: Physician Assistant

## 2018-08-10 ENCOUNTER — Ambulatory Visit: Payer: PPO

## 2018-08-11 ENCOUNTER — Inpatient Hospital Stay: Payer: PPO

## 2018-08-11 ENCOUNTER — Encounter: Payer: Self-pay | Admitting: Physician Assistant

## 2018-08-11 ENCOUNTER — Telehealth: Payer: Self-pay | Admitting: Physician Assistant

## 2018-08-11 ENCOUNTER — Inpatient Hospital Stay (HOSPITAL_BASED_OUTPATIENT_CLINIC_OR_DEPARTMENT_OTHER): Payer: PPO | Admitting: Physician Assistant

## 2018-08-11 ENCOUNTER — Other Ambulatory Visit: Payer: Self-pay

## 2018-08-11 VITALS — BP 122/71 | HR 98 | Temp 99.1°F | Resp 18 | Ht 59.0 in | Wt 131.6 lb

## 2018-08-11 DIAGNOSIS — G4700 Insomnia, unspecified: Secondary | ICD-10-CM | POA: Diagnosis not present

## 2018-08-11 DIAGNOSIS — Z79899 Other long term (current) drug therapy: Secondary | ICD-10-CM

## 2018-08-11 DIAGNOSIS — Z5112 Encounter for antineoplastic immunotherapy: Secondary | ICD-10-CM | POA: Diagnosis not present

## 2018-08-11 DIAGNOSIS — R0602 Shortness of breath: Secondary | ICD-10-CM | POA: Diagnosis not present

## 2018-08-11 DIAGNOSIS — E119 Type 2 diabetes mellitus without complications: Secondary | ICD-10-CM | POA: Diagnosis not present

## 2018-08-11 DIAGNOSIS — F17210 Nicotine dependence, cigarettes, uncomplicated: Secondary | ICD-10-CM | POA: Diagnosis not present

## 2018-08-11 DIAGNOSIS — C3431 Malignant neoplasm of lower lobe, right bronchus or lung: Secondary | ICD-10-CM

## 2018-08-11 DIAGNOSIS — M129 Arthropathy, unspecified: Secondary | ICD-10-CM | POA: Diagnosis not present

## 2018-08-11 LAB — CBC WITH DIFFERENTIAL (CANCER CENTER ONLY)
Abs Immature Granulocytes: 0.02 10*3/uL (ref 0.00–0.07)
Basophils Absolute: 0 10*3/uL (ref 0.0–0.1)
Basophils Relative: 1 %
Eosinophils Absolute: 0.1 10*3/uL (ref 0.0–0.5)
Eosinophils Relative: 1 %
HCT: 40.2 % (ref 36.0–46.0)
Hemoglobin: 13.7 g/dL (ref 12.0–15.0)
Immature Granulocytes: 0 %
Lymphocytes Relative: 22 %
Lymphs Abs: 1.3 10*3/uL (ref 0.7–4.0)
MCH: 31.5 pg (ref 26.0–34.0)
MCHC: 34.1 g/dL (ref 30.0–36.0)
MCV: 92.4 fL (ref 80.0–100.0)
Monocytes Absolute: 0.7 10*3/uL (ref 0.1–1.0)
Monocytes Relative: 11 %
Neutro Abs: 3.7 10*3/uL (ref 1.7–7.7)
Neutrophils Relative %: 65 %
Platelet Count: 228 10*3/uL (ref 150–400)
RBC: 4.35 MIL/uL (ref 3.87–5.11)
RDW: 12.6 % (ref 11.5–15.5)
WBC Count: 5.8 10*3/uL (ref 4.0–10.5)
nRBC: 0 % (ref 0.0–0.2)

## 2018-08-11 LAB — CMP (CANCER CENTER ONLY)
ALT: 19 U/L (ref 0–44)
AST: 20 U/L (ref 15–41)
Albumin: 4.1 g/dL (ref 3.5–5.0)
Alkaline Phosphatase: 102 U/L (ref 38–126)
Anion gap: 11 (ref 5–15)
BUN: 12 mg/dL (ref 8–23)
CO2: 23 mmol/L (ref 22–32)
Calcium: 10 mg/dL (ref 8.9–10.3)
Chloride: 107 mmol/L (ref 98–111)
Creatinine: 0.82 mg/dL (ref 0.44–1.00)
GFR, Est AFR Am: >60 mL/min (ref >60)
GFR, Estimated: >60 mL/min (ref >60)
Glucose, Bld: 94 mg/dL (ref 70–99)
Potassium: 4.6 mmol/L (ref 3.5–5.1)
Sodium: 141 mmol/L (ref 135–145)
Total Bilirubin: 0.4 mg/dL (ref 0.3–1.2)
Total Protein: 7.3 g/dL (ref 6.5–8.1)

## 2018-08-11 MED ORDER — SODIUM CHLORIDE 0.9 % IV SOLN
620.0000 mg | Freq: Once | INTRAVENOUS | Status: AC
  Administered 2018-08-11: 620 mg via INTRAVENOUS
  Filled 2018-08-11: qty 10

## 2018-08-11 MED ORDER — SODIUM CHLORIDE 0.9 % IV SOLN
Freq: Once | INTRAVENOUS | Status: AC
  Administered 2018-08-11: 15:00:00 via INTRAVENOUS
  Filled 2018-08-11: qty 250

## 2018-08-11 NOTE — Patient Instructions (Signed)
[facility] Discharge Instructions for Patients Receiving Chemotherapy  Today you received the following chemotherapy agent: Imfinzi.  To help prevent nausea and vomiting after your treatment, we encourage you to take your nausea medication as directed.    If you develop nausea and vomiting that is not controlled by your nausea medication, call the clinic.   BELOW ARE SYMPTOMS THAT SHOULD BE REPORTED IMMEDIATELY:  *FEVER GREATER THAN 100.5 F  *CHILLS WITH OR WITHOUT FEVER  NAUSEA AND VOMITING THAT IS NOT CONTROLLED WITH YOUR NAUSEA MEDICATION  *UNUSUAL SHORTNESS OF BREATH  *UNUSUAL BRUISING OR BLEEDING  TENDERNESS IN MOUTH AND THROAT WITH OR WITHOUT PRESENCE OF ULCERS  *URINARY PROBLEMS  *BOWEL PROBLEMS  UNUSUAL RASH Items with * indicate a potential emergency and should be followed up as soon as possible.  Feel free to call the clinic should you have any questions or concerns. The clinic phone number is [phone].  Please show the [facility] at check-in to the Emergency Department and triage nurse.  Coronavirus (COVID-19) Are you at risk?  Are you at risk for the Coronavirus (COVID-19)?  To be considered HIGH RISK for Coronavirus (COVID-19), you have to meet the following criteria:  . Traveled to [place], [place], [place], [place] or [place]; or in the [place] to [place], [place], [place], or [place]; and have fever, cough, and shortness of breath within the last 2 weeks of travel OR . Been in close contact with a person diagnosed with COVID-19 within the last 2 weeks and have fever, cough, and shortness of breath . IF YOU DO NOT MEET THESE CRITERIA, YOU ARE CONSIDERED LOW RISK FOR COVID-19.  What to do if you are HIGH RISK for COVID-19?  Marland Kitchen If you are having a medical emergency, call 911. . Seek medical care right away. Before you go to a doctor's office, urgent care or emergency department, call ahead and tell them about your  recent travel, contact with someone diagnosed with COVID-19, and your symptoms. You should receive instructions from your physician's office regarding next steps of care.  . When you arrive at healthcare provider, tell the healthcare staff immediately you have returned from visiting [place], [place], [place], [place] or [place]; or traveled in the [place] to [place], [place], [place], or [place]; in the last two weeks or you have been in close contact with a person diagnosed with COVID-19 in the last 2 weeks.   . Tell the health care staff about your symptoms: fever, cough and shortness of breath. . After you have been seen by a medical provider, you will be either: o Tested for (COVID-19) and discharged home on quarantine except to seek medical care if symptoms worsen, and asked to  - Stay home and avoid contact with others until you get your results (4-5 days)  - Avoid travel on public transportation if possible (such as bus, train, or airplane) or o Sent to the Emergency Department by EMS for evaluation, COVID-19 testing, and possible admission depending on your condition and test results.  What to do if you are LOW RISK for COVID-19?  Reduce your risk of any infection by using the same precautions used for avoiding the common cold or flu:  Marland Kitchen Wash your hands often with soap and warm water for at least 20 seconds.  If soap and water are not readily available, use an alcohol-based hand sanitizer with at least 60% alcohol.  . If coughing or  sneezing, cover your mouth and nose by coughing or sneezing into the elbow areas of your shirt or coat, into a tissue or into your sleeve (not your hands). . Avoid shaking hands with others and consider head nods or verbal greetings only. . Avoid touching your eyes, nose, or mouth with unwashed hands.  . Avoid close contact with people who are sick. . Avoid places or events with large numbers of people in one location, like concerts or sporting  events. . Carefully consider travel plans you have or are making. . If you are planning any travel outside or inside the [name], visit the CDC's Travelers' Health webpage for the latest health notices. . If you have some symptoms but not all symptoms, continue to monitor at home and seek medical attention if your symptoms worsen. . If you are having a medical emergency, call 911.   [facility] / e-Visit: [email or web address]         [facility]: [facility]: [phone]                   [facility]: [phone]

## 2018-08-11 NOTE — Telephone Encounter (Signed)
Scheduled appt per 4/30 los - pt to get an updated schedule next visit.

## 2018-08-11 NOTE — Progress Notes (Signed)
[facility] [facility] OFFICE PROGRESS NOTE  Katelyn Sanders, MD [place] [address]  DIAGNOSIS: Stage IIIA (T2a,N2, M0) non-small cell lung cancer,adenocarcinomapresenting with right lower lobe lung mass in addition to subcarinal lymphadenopathy and suspicious metastatic pulmonary nodules diagnosed in February 2019.  PRIOR THERAPY: Concurrent chemoradiation with carboplatin for an AUC of 2 with paclitaxel 45 mg/m weekly. First dosegiven on3/18/2019. Status post 7 cycles. Last dose was giving August 09, 2017 with partial response.  CURRENT THERAPY: Consolidation immunotherapy with Imfinzi (Durvalumab) 10 mg/KG every 2 weeks, first dose 09/23/2017. Status post 23cycles.  INTERVAL HISTORY: Katelyn Kennedy 73 y.o. female returns to the clinic today for a follow-up visit.  The patient is feeling well today without any concerning complaints except for insomnia for which she takes trazodone.  She continues to tolerate her Imfinzi well without any adverse effects.  She denies any nausea, vomiting, diarrhea, or constipation.  She denies any fever, chills, night sweats, or weight loss.  He denies any chest pain, shortness of breath, cough, or hemoptysis.  She denies any headaches or visual changes.  She denies any rashes or skin changes except for a dry patch of skin on her left hand.  She is here today for evaluation prior to starting cycle #24.   MEDICAL HISTORY: Past Medical History:  Diagnosis Date  . Allergic rhinitis   . Arthritis   . Diabetes mellitus without complication (HCC)    diet control/no meds per pt  . Dyspnea   . History of chicken pox   . lung ca dx'd 04/2017  . Smoker     ALLERGIES:  is allergic to codeine.  MEDICATIONS:  Current Outpatient Medications  Medication Sig Dispense Refill  . acetaminophen (TYLENOL) 500 MG tablet Take 500 mg by mouth daily as needed for moderate pain or headache.    Marland Kitchen atorvastatin (LIPITOR) 20 MG tablet Take 1  tablet (20 mg total) by mouth daily. 90 tablet 3  . lisinopril (PRINIVIL,ZESTRIL) 5 MG tablet TAKE 1/2 TABLET BY MOUTH ONCE DAILY 45 tablet 1  . Multiple Vitamin (MULTIVITAMIN) capsule Take 1 capsule by mouth daily.      Vladimir Faster Glycol-Propyl Glycol (SYSTANE) 0.4-0.3 % SOLN Apply to eye.    . prochlorperazine (COMPAZINE) 10 MG tablet Take 1 tablet (10 mg total) by mouth every 6 (six) hours as needed for nausea or vomiting. 30 tablet 0  . traZODone (DESYREL) 50 MG tablet TAKE 1/2 TO 1 TABLET(25 TO 50 MG) BY MOUTH AT BEDTIME AS NEEDED FOR SLEEP 90 tablet 0   No current facility-administered medications for this visit.     SURGICAL HISTORY:  Past Surgical History:  Procedure Laterality Date  . ANKLE FRACTURE SURGERY Left 10 years ago   "hard to wake up from anesthesia" per pt  . [place]  . BREAST BIOPSY Right 1978   BENIGN CYST  . BREAST EXCISIONAL BIOPSY Left 2011   Benign  . BREAST EXCISIONAL BIOPSY Right 1985   Benign   . CATARACT EXTRACTION    . CATARACT EXTRACTION W/ INTRAOCULAR LENS IMPLANT Bilateral   . COLONOSCOPY  2011  . ELECTROMAGNETIC NAVIGATION BROCHOSCOPY N/A 06/08/2017   Procedure: ELECTROMAGNETIC NAVIGATION BRONCHOSCOPY;  Surgeon: Flora Lipps, MD;  Location: [facility];  Service: Cardiopulmonary;  Laterality: N/A;  . POLYPECTOMY  2011  . TRANSURETHRAL RESECTION OF BLADDER TUMOR WITH MITOMYCIN-C N/A 05/04/2017   Procedure: TRANSURETHRAL RESECTION OF BLADDER TUMOR WITH gemcitabine;  Surgeon: John Giovanni  C, MD;  Location: [facility] OR[facility];  Service: Urology;  Laterality: N/A;  . TUBAL LIGATION      REVIEW OF SYSTEMS:   Review of Systems  Constitutional: Negative for appetite change, chills, fatigue, fever and unexpected weight change.  HENT:   Negative for mouth sores, nosebleeds, sore throat and trouble swallowing.   Eyes: Negative for eye problems and icterus.  Respiratory: Negative for cough, hemoptysis, shortness of breath and wheezing.    Cardiovascular: Negative for chest pain and leg swelling.  Gastrointestinal: Negative for abdominal pain, constipation, diarrhea, nausea and vomiting.  Genitourinary: Negative for bladder incontinence, difficulty urinating, dysuria, frequency and hematuria.   Musculoskeletal: Negative for back pain, gait problem, neck pain and neck stiffness.  Skin: Negative for itching and rash.  Neurological: Negative for dizziness, extremity weakness, gait problem, headaches, light-headedness and seizures.  Hematological: Negative for adenopathy. Does not bruise/bleed easily.  Psychiatric/Behavioral: Negative for confusion, depression and sleep disturbance. The patient is not nervous/anxious.     PHYSICAL EXAMINATION:  Blood pressure 122/71, pulse 98, temperature 99.1 F (37.3 C), temperature source Oral, resp. rate 18, height 4\' 11"  (1.499 m), weight 131 lb 9.6 oz (59.7 kg), SpO2 98 %.  ECOG PERFORMANCE STATUS: 1 - Symptomatic but completely ambulatory  Physical Exam  Constitutional: Oriented to person, place, and time and well-developed, well-nourished, and in no distress.   HENT:  Head: Normocephalic and atraumatic.  Mouth/Throat: Oropharynx is clear and moist. No oropharyngeal exudate.  Eyes: Conjunctivae are normal. Right eye exhibits no discharge. Left eye exhibits no discharge. No scleral icterus.  Neck: Normal range of motion. Neck supple.  Cardiovascular: Normal rate, regular rhythm, normal heart sounds and intact distal pulses.   Pulmonary/Chest: Effort normal and breath sounds normal. No respiratory distress. No wheezes. No rales.  Abdominal: Soft. Bowel sounds are normal. Exhibits no distension and no mass. There is no tenderness.  Musculoskeletal: Normal range of motion. Exhibits no edema.  Lymphadenopathy:    No cervical adenopathy.  Neurological: Alert and oriented to person, place, and time. Exhibits normal muscle tone. Gait normal. Coordination normal.  Skin: Skin is warm and dry.  No rash noted. Not diaphoretic. No erythema. No pallor.  Psychiatric: Mood, memory and judgment normal.  Vitals reviewed.  LABORATORY DATA: Lab Results  Component Value Date   WBC 5.8 08/11/2018   HGB 13.7 08/11/2018   HCT 40.2 08/11/2018   MCV 92.4 08/11/2018   PLT 228 08/11/2018      Chemistry      Component Value Date/Time   NA 141 08/11/2018 1402   K 4.6 08/11/2018 1402   CL 107 08/11/2018 1402   CO2 23 08/11/2018 1402   BUN 12 08/11/2018 1402   CREATININE 0.82 08/11/2018 1402      Component Value Date/Time   CALCIUM 10.0 08/11/2018 1402   ALKPHOS 102 08/11/2018 1402   AST 20 08/11/2018 1402   ALT 19 08/11/2018 1402   BILITOT 0.4 08/11/2018 1402       RADIOGRAPHIC STUDIES:  No results found.   ASSESSMENT/PLAN:  This is a very pleasant 73 year old Caucasian female with stage IIIa non-small cell lung cancer, adenocarcinoma.  She presented with a right lower lobe lung mass and subcarinal lymphadenopathy and pulmonary nodules which are suspicious for metastatic disease.  She was diagnosed in February 2019. She completed a course of concurrent chemoradiation with weekly carboplatin and paclitaxel.  She tolerated treatment well with a partial response. She is currently undergoing consolidation immunotherapy with Imfinzi.  She is  status post 23 cycles.  She is tolerating treatment well without any concerning adverse effects.  The patient was seen with Dr. Julien Nordmann today.  Labs were reviewed with the patient.  We recommend that she proceed with cycle #24 today as scheduled. We will see her back in 2 weeks for evaluation prior to starting cycle #25. The patient was advised to call immediately if she has any concerning symptoms in the interval. The patient voices understanding of current disease status and treatment options and is in agreement with the current care plan. All questions were answered. The patient knows to call the clinic with any problems, questions or concerns.  We can certainly see the patient much sooner if necessary  No orders of the defined types were placed in this encounter.    Ernesto Zukowski L Loukas Antonson, PA-C 08/11/18  ADDENDUM: Hematology/Oncology Attending: I had a face-to-face encounter with the patient today.  I recommended her care plan.  This is a very pleasant 73 years old white female with stage IIIa non-small cell lung cancer, adenocarcinoma status post induction concurrent chemoradiation with weekly carboplatin and paclitaxel with partial response.  The patient is currently undergoing consolidation treatment with Imfinzi status post 23 cycles.  She has been tolerating this treatment well with no concerning adverse effects. I recommended for the patient to proceed with cycle #24 today as scheduled. I will see her back for follow-up visit in 2 weeks for evaluation before the next cycle of her treatment. She was advised to call immediately if she has any concerning symptoms in the interval.  Disclaimer: This note was dictated with voice recognition software. Similar sounding words can inadvertently be transcribed and may be missed upon review. Eilleen Kempf, MD 08/11/18

## 2018-08-22 ENCOUNTER — Ambulatory Visit: Payer: PPO

## 2018-08-24 ENCOUNTER — Inpatient Hospital Stay (HOSPITAL_BASED_OUTPATIENT_CLINIC_OR_DEPARTMENT_OTHER): Payer: PPO | Admitting: Physician Assistant

## 2018-08-24 ENCOUNTER — Other Ambulatory Visit: Payer: Self-pay

## 2018-08-24 ENCOUNTER — Inpatient Hospital Stay: Payer: PPO | Attending: Internal Medicine

## 2018-08-24 ENCOUNTER — Encounter: Payer: Self-pay | Admitting: Physician Assistant

## 2018-08-24 ENCOUNTER — Inpatient Hospital Stay: Payer: PPO

## 2018-08-24 VITALS — BP 104/55 | HR 90 | Temp 98.7°F | Resp 18 | Ht 59.0 in | Wt 136.1 lb

## 2018-08-24 DIAGNOSIS — C3431 Malignant neoplasm of lower lobe, right bronchus or lung: Secondary | ICD-10-CM

## 2018-08-24 DIAGNOSIS — E119 Type 2 diabetes mellitus without complications: Secondary | ICD-10-CM

## 2018-08-24 DIAGNOSIS — F17210 Nicotine dependence, cigarettes, uncomplicated: Secondary | ICD-10-CM | POA: Diagnosis not present

## 2018-08-24 DIAGNOSIS — R918 Other nonspecific abnormal finding of lung field: Secondary | ICD-10-CM

## 2018-08-24 DIAGNOSIS — C771 Secondary and unspecified malignant neoplasm of intrathoracic lymph nodes: Secondary | ICD-10-CM | POA: Diagnosis not present

## 2018-08-24 DIAGNOSIS — R200 Anesthesia of skin: Secondary | ICD-10-CM

## 2018-08-24 DIAGNOSIS — M129 Arthropathy, unspecified: Secondary | ICD-10-CM

## 2018-08-24 DIAGNOSIS — R0602 Shortness of breath: Secondary | ICD-10-CM | POA: Insufficient documentation

## 2018-08-24 DIAGNOSIS — Z5112 Encounter for antineoplastic immunotherapy: Secondary | ICD-10-CM | POA: Diagnosis not present

## 2018-08-24 DIAGNOSIS — R202 Paresthesia of skin: Secondary | ICD-10-CM | POA: Diagnosis not present

## 2018-08-24 DIAGNOSIS — Z79899 Other long term (current) drug therapy: Secondary | ICD-10-CM

## 2018-08-24 LAB — CBC WITH DIFFERENTIAL (CANCER CENTER ONLY)
Abs Immature Granulocytes: 0.03 10*3/uL (ref 0.00–0.07)
Basophils Absolute: 0 10*3/uL (ref 0.0–0.1)
Basophils Relative: 0 %
Eosinophils Absolute: 0.1 10*3/uL (ref 0.0–0.5)
Eosinophils Relative: 1 %
HCT: 38.8 % (ref 36.0–46.0)
Hemoglobin: 12.9 g/dL (ref 12.0–15.0)
Immature Granulocytes: 0 %
Lymphocytes Relative: 13 %
Lymphs Abs: 1 10*3/uL (ref 0.7–4.0)
MCH: 31.5 pg (ref 26.0–34.0)
MCHC: 33.2 g/dL (ref 30.0–36.0)
MCV: 94.6 fL (ref 80.0–100.0)
Monocytes Absolute: 0.8 10*3/uL (ref 0.1–1.0)
Monocytes Relative: 11 %
Neutro Abs: 5.9 10*3/uL (ref 1.7–7.7)
Neutrophils Relative %: 75 %
Platelet Count: 219 10*3/uL (ref 150–400)
RBC: 4.1 MIL/uL (ref 3.87–5.11)
RDW: 13.1 % (ref 11.5–15.5)
WBC Count: 7.9 10*3/uL (ref 4.0–10.5)
nRBC: 0 % (ref 0.0–0.2)

## 2018-08-24 LAB — CMP (CANCER CENTER ONLY)
ALT: 17 U/L (ref 0–44)
AST: 17 U/L (ref 15–41)
Albumin: 3.7 g/dL (ref 3.5–5.0)
Alkaline Phosphatase: 86 U/L (ref 38–126)
Anion gap: 8 (ref 5–15)
BUN: 14 mg/dL (ref 8–23)
CO2: 24 mmol/L (ref 22–32)
Calcium: 9.9 mg/dL (ref 8.9–10.3)
Chloride: 109 mmol/L (ref 98–111)
Creatinine: 0.77 mg/dL (ref 0.44–1.00)
GFR, Est AFR Am: >60 mL/min (ref >60)
GFR, Estimated: >60 mL/min (ref >60)
Glucose, Bld: 106 mg/dL — ABNORMAL HIGH (ref 70–99)
Potassium: 4.2 mmol/L (ref 3.5–5.1)
Sodium: 141 mmol/L (ref 135–145)
Total Bilirubin: 0.3 mg/dL (ref 0.3–1.2)
Total Protein: 6.9 g/dL (ref 6.5–8.1)

## 2018-08-24 LAB — TSH: TSH: 1.846 u[IU]/mL (ref 0.308–3.960)

## 2018-08-24 MED ORDER — SODIUM CHLORIDE 0.9 % IV SOLN
10.8000 mg/kg | Freq: Once | INTRAVENOUS | Status: AC
  Administered 2018-08-24: 620 mg via INTRAVENOUS
  Filled 2018-08-24: qty 2.4

## 2018-08-24 MED ORDER — SODIUM CHLORIDE 0.9 % IV SOLN
Freq: Once | INTRAVENOUS | Status: AC
  Administered 2018-08-24: 11:00:00 via INTRAVENOUS
  Filled 2018-08-24: qty 250

## 2018-08-24 NOTE — Progress Notes (Signed)
[facility] OFFICE PROGRESS NOTE  [name], MD [place] [address]  DIAGNOSIS: Stage IIIA (T2a,N2, M0) non-small cell lung cancer,adenocarcinomapresenting with right lower lobe lung mass in addition to subcarinal lymphadenopathy and suspicious metastatic pulmonary nodules diagnosed in February 2019.  PRIOR THERAPY: Concurrent chemoradiation with carboplatin for an AUC of 2 with paclitaxel 45 mg/m weekly. First dosegiven on3/18/2019. Status post 7 cycles. Last dose was giving August 09, 2017 with partial response.  CURRENT THERAPY: Consolidation immunotherapy with Imfinzi (Durvalumab) 10 mg/KG every 2 weeks, first dose 09/23/2017. Status post 25cycles.  INTERVAL HISTORY: Katelyn Kennedy 73 y.o. female returns to the clinic for a follow-up visit.  The patient is feeling well today without any concerning complaints. She did mention concerns for her "hands falling asleep". She states she experiences numbness and tingling in her hands that is provoked by sleeping on her arm "funny". The distribution of the numbness and tingling is only in certain fingers, although she is unable to recall which ones. She has had this in the past but states it has been occurring more frequently. She denies any pain or inability to perform her daily tasks. She notes she occasionally has numbness in her feet bilaterally as well, however, this is less frequent.  She continues to tolerate her Imfinzi fairly well without any adverse effects.  She denies any nausea, vomiting, diarrhea, or constipation.  She denies any fever, chills, night sweats, or weight loss.  She denies any chest pain, shortness of breath, cough, or hemoptysis. She denies any headache or visual changes.  She denies any rashes or skin changes but continues to report dry skin on the palms of her hands, which has been present for several years. She is here today for evaluation prior to starting cycle  #25.  MEDICAL HISTORY: Past Medical History:  Diagnosis Date  . Allergic rhinitis   . Arthritis   . Diabetes mellitus without complication (HCC)    diet control/no meds per pt  . Dyspnea   . History of chicken pox   . lung ca dx'd 04/2017  . Smoker     ALLERGIES:  is allergic to codeine.  MEDICATIONS:  Current Outpatient Medications  Medication Sig Dispense Refill  . acetaminophen (TYLENOL) 500 MG tablet Take 500 mg by mouth daily as needed for moderate pain or headache.    Marland Kitchen atorvastatin (LIPITOR) 20 MG tablet Take 1 tablet (20 mg total) by mouth daily. 90 tablet 3  . lisinopril (PRINIVIL,ZESTRIL) 5 MG tablet TAKE 1/2 TABLET BY MOUTH ONCE DAILY 45 tablet 1  . Multiple Vitamin (MULTIVITAMIN) capsule Take 1 capsule by mouth daily.      Vladimir Faster Glycol-Propyl Glycol (SYSTANE) 0.4-0.3 % SOLN Apply to eye.    . traZODone (DESYREL) 50 MG tablet TAKE 1/2 TO 1 TABLET(25 TO 50 MG) BY MOUTH AT BEDTIME AS NEEDED FOR SLEEP 90 tablet 0  . prochlorperazine (COMPAZINE) 10 MG tablet Take 1 tablet (10 mg total) by mouth every 6 (six) hours as needed for nausea or vomiting. (Patient not taking: Reported on 08/24/2018) 30 tablet 0   No current facility-administered medications for this visit.    Facility-Administered Medications Ordered in Other Visits  Medication Dose Route Frequency Provider Last Rate Last Dose  . durvalumab (IMFINZI) 620 mg in sodium chloride 0.9 % 100 mL chemo infusion  10.8 mg/kg (Treatment Plan Recorded) Intravenous Once Curt Bears, MD 112 mL/hr at 08/24/18 1114 620 mg at 08/24/18 1114  SURGICAL HISTORY:  Past Surgical History:  Procedure Laterality Date  . ANKLE FRACTURE SURGERY Left 10 years ago   "hard to wake up from anesthesia" per pt  . [place]  . BREAST BIOPSY Right 1978   BENIGN CYST  . BREAST EXCISIONAL BIOPSY Left 2011   Benign  . BREAST EXCISIONAL BIOPSY Right 1985   Benign   . CATARACT EXTRACTION    . CATARACT EXTRACTION  W/ INTRAOCULAR LENS IMPLANT Bilateral   . COLONOSCOPY  2011  . ELECTROMAGNETIC NAVIGATION BROCHOSCOPY N/A 06/08/2017   Procedure: ELECTROMAGNETIC NAVIGATION BRONCHOSCOPY;  Surgeon: Flora Lipps, MD;  Location: [facility] OR[facility];  Service: Cardiopulmonary;  Laterality: N/A;  . POLYPECTOMY  2011  . 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 TUBAL LIGATION      REVIEW OF SYSTEMS:   Review of Systems  Constitutional: Negative for appetite change, chills, fatigue, fever and unexpected weight change.  HENT:   Negative for mouth sores, nosebleeds, sore throat and trouble swallowing.   Eyes: Negative for eye problems and icterus.  Respiratory: Negative for cough, hemoptysis, shortness of breath and wheezing.   Cardiovascular: Negative for chest pain and leg swelling.  Gastrointestinal: Negative for abdominal pain, constipation, diarrhea, nausea and vomiting.  Genitourinary: Negative for bladder incontinence, difficulty urinating, dysuria, frequency and hematuria.   Musculoskeletal: Negative for back pain, gait problem, neck pain and neck stiffness.  Skin: Positive for dry skin bilaterally on the palms of her hands. Negative for itching and rash.  Neurological: Positive for occasional numbness in her hands. Negative for dizziness, extremity weakness, gait problem, headaches, light-headedness and seizures.  Hematological: Negative for adenopathy. Does not bruise/bleed easily.  Psychiatric/Behavioral: Negative for confusion, depression and sleep disturbance. The patient is not nervous/anxious.     PHYSICAL EXAMINATION:  Blood pressure (!) 104/55, pulse 90, temperature 98.7 F (37.1 C), temperature source Oral, resp. rate 18, height 4\' 11"  (1.499 m), weight 136 lb 1.6 oz (61.7 kg), SpO2 98 %.  ECOG PERFORMANCE STATUS:  1 - Symptomatic but completely ambulatory  Physical Exam  Constitutional: Oriented to person, place, and time and well-developed, well-nourished, and in no distress.   HENT:  Head: Normocephalic and atraumatic.  Mouth/Throat: Oropharynx is clear and moist. No oropharyngeal exudate.  Eyes: Conjunctivae are normal. Right eye exhibits no discharge. Left eye exhibits no discharge. No scleral icterus.  Neck: Normal range of motion. Neck supple.  Cardiovascular: Normal rate, regular rhythm, normal heart sounds and intact distal pulses.   Pulmonary/Chest: Effort normal and breath sounds normal. No respiratory distress. No wheezes. No rales.  Abdominal: Soft. Bowel sounds are normal. Exhibits no distension and no mass. There is no tenderness.  Musculoskeletal: Normal range of motion. Exhibits no edema.  Lymphadenopathy:    No cervical adenopathy.  Neurological: Alert and oriented to person, place, and time. Exhibits normal muscle tone. Gait normal. Coordination normal.  Skin: Skin is warm and dry. No rash noted. Not diaphoretic. No erythema. No pallor.  Psychiatric: Mood, memory and judgment normal.  Vitals reviewed.  LABORATORY DATA: Lab Results  Component Value Date   WBC 7.9 08/24/2018   HGB 12.9 08/24/2018   HCT 38.8 08/24/2018   MCV 94.6 08/24/2018   PLT 219 08/24/2018      Chemistry      Component Value Date/Time  NA 141 08/24/2018 0905   K 4.2 08/24/2018 0905   CL 109 08/24/2018 0905   CO2 24 08/24/2018 0905   BUN 14 08/24/2018 0905   CREATININE 0.77 08/24/2018 0905      Component Value Date/Time   CALCIUM 9.9 08/24/2018 0905   ALKPHOS 86 08/24/2018 0905   AST 17 08/24/2018 0905   ALT 17 08/24/2018 0905   BILITOT 0.3 08/24/2018 0905       RADIOGRAPHIC STUDIES:  No results found.   ASSESSMENT/PLAN:  This is a very pleasant 73 year old Caucasian female with stage IIIa non-small cell lung cancer, adenocarcinoma.  She presented with a right lower lobe lung mass and  subcarinal lymphadenopathy and pulmonary nodules which are suspicious for metastatic disease.  She was diagnosed in February 2019. She completed a course of concurrent chemoradiation with weekly carboplatin and paclitaxel.  She tolerated this treatment well with a partial response. She is currently undergoing consolidation immunotherapy with Imfinzi 10 mg/kg IV every 2 weeks.  She is status post 24 cycles.  She tolerated this treatment well without any adverse effects. The patient was seen with Dr. Julien Nordmann today.  Labs were reviewed with the patient.  We recommend that she proceed with cycle #25 today as scheduled. We will see her back for follow-up visit in 2 weeks for evaluation prior to starting cycle #26. The patient's blood pressure was 104/55 today. The patient was instructed to check her blood pressures at home and to increase her fluid intake.  The patient was advised to call immediately if she has any concerning symptoms in the interval. The patient voices understanding of current disease status and treatment options and is in agreement with the current care plan. All questions were answered. The patient knows to call the clinic with any problems, questions or concerns. We can certainly see the patient much sooner if necessary  No orders of the defined types were placed in this encounter.    Katelyn Care L Asli Tokarski, PA-C 08/24/18  ADDENDUM: Hematology/Oncology Attending:  I had a face-to-face encounter with the patient today.  I recommended her care plan.  This is a very pleasant 73 years old white female with a stage IIIa non-small cell lung cancer, status post induction concurrent chemoradiation and she is currently undergoing consolidation treatment with Imfinzi status post 24 cycles. The patient has been tolerating her treatment well with no concerning adverse side effects. I recommended for her to proceed with cycle #25 today as scheduled. She will come back for follow-up visit  in 2 weeks for evaluation before the last cycle of her treatment. She was advised to call immediately if she has any concerning symptoms in the interval.  Disclaimer: This note was dictated with voice recognition software. Similar sounding words can inadvertently be transcribed and may be missed upon review. Eilleen Kempf, MD 08/24/18

## 2018-08-25 ENCOUNTER — Encounter: Payer: PPO | Admitting: Family Medicine

## 2018-09-08 ENCOUNTER — Inpatient Hospital Stay: Payer: PPO

## 2018-09-08 ENCOUNTER — Other Ambulatory Visit: Payer: Self-pay

## 2018-09-08 ENCOUNTER — Inpatient Hospital Stay (HOSPITAL_BASED_OUTPATIENT_CLINIC_OR_DEPARTMENT_OTHER): Payer: PPO | Admitting: Physician Assistant

## 2018-09-08 ENCOUNTER — Encounter: Payer: Self-pay | Admitting: Physician Assistant

## 2018-09-08 VITALS — BP 106/60 | HR 92 | Temp 98.2°F | Resp 18 | Ht 59.0 in | Wt 133.2 lb

## 2018-09-08 DIAGNOSIS — C3431 Malignant neoplasm of lower lobe, right bronchus or lung: Secondary | ICD-10-CM

## 2018-09-08 DIAGNOSIS — M129 Arthropathy, unspecified: Secondary | ICD-10-CM

## 2018-09-08 DIAGNOSIS — C771 Secondary and unspecified malignant neoplasm of intrathoracic lymph nodes: Secondary | ICD-10-CM | POA: Diagnosis not present

## 2018-09-08 DIAGNOSIS — F17210 Nicotine dependence, cigarettes, uncomplicated: Secondary | ICD-10-CM

## 2018-09-08 DIAGNOSIS — R202 Paresthesia of skin: Secondary | ICD-10-CM | POA: Diagnosis not present

## 2018-09-08 DIAGNOSIS — R200 Anesthesia of skin: Secondary | ICD-10-CM | POA: Diagnosis not present

## 2018-09-08 DIAGNOSIS — E119 Type 2 diabetes mellitus without complications: Secondary | ICD-10-CM | POA: Diagnosis not present

## 2018-09-08 DIAGNOSIS — Z5112 Encounter for antineoplastic immunotherapy: Secondary | ICD-10-CM | POA: Diagnosis not present

## 2018-09-08 DIAGNOSIS — R918 Other nonspecific abnormal finding of lung field: Secondary | ICD-10-CM | POA: Diagnosis not present

## 2018-09-08 DIAGNOSIS — R0602 Shortness of breath: Secondary | ICD-10-CM | POA: Diagnosis not present

## 2018-09-08 DIAGNOSIS — Z79899 Other long term (current) drug therapy: Secondary | ICD-10-CM | POA: Diagnosis not present

## 2018-09-08 LAB — CMP (CANCER CENTER ONLY)
ALT: 19 U/L (ref 0–44)
AST: 20 U/L (ref 15–41)
Albumin: 3.9 g/dL (ref 3.5–5.0)
Alkaline Phosphatase: 99 U/L (ref 38–126)
Anion gap: 8 (ref 5–15)
BUN: 15 mg/dL (ref 8–23)
CO2: 24 mmol/L (ref 22–32)
Calcium: 10 mg/dL (ref 8.9–10.3)
Chloride: 107 mmol/L (ref 98–111)
Creatinine: 0.86 mg/dL (ref 0.44–1.00)
GFR, Est AFR Am: >60 mL/min (ref >60)
GFR, Estimated: >60 mL/min (ref >60)
Glucose, Bld: 123 mg/dL — ABNORMAL HIGH (ref 70–99)
Potassium: 4.4 mmol/L (ref 3.5–5.1)
Sodium: 139 mmol/L (ref 135–145)
Total Bilirubin: 0.5 mg/dL (ref 0.3–1.2)
Total Protein: 7.3 g/dL (ref 6.5–8.1)

## 2018-09-08 LAB — CBC WITH DIFFERENTIAL (CANCER CENTER ONLY)
Abs Immature Granulocytes: 0.01 10*3/uL (ref 0.00–0.07)
Basophils Absolute: 0 10*3/uL (ref 0.0–0.1)
Basophils Relative: 1 %
Eosinophils Absolute: 0.1 10*3/uL (ref 0.0–0.5)
Eosinophils Relative: 1 %
HCT: 40.3 % (ref 36.0–46.0)
Hemoglobin: 13.7 g/dL (ref 12.0–15.0)
Immature Granulocytes: 0 %
Lymphocytes Relative: 19 %
Lymphs Abs: 1.1 10*3/uL (ref 0.7–4.0)
MCH: 32.1 pg (ref 26.0–34.0)
MCHC: 34 g/dL (ref 30.0–36.0)
MCV: 94.4 fL (ref 80.0–100.0)
Monocytes Absolute: 0.5 10*3/uL (ref 0.1–1.0)
Monocytes Relative: 9 %
Neutro Abs: 4 10*3/uL (ref 1.7–7.7)
Neutrophils Relative %: 70 %
Platelet Count: 233 10*3/uL (ref 150–400)
RBC: 4.27 MIL/uL (ref 3.87–5.11)
RDW: 12.9 % (ref 11.5–15.5)
WBC Count: 5.6 10*3/uL (ref 4.0–10.5)
nRBC: 0 % (ref 0.0–0.2)

## 2018-09-08 MED ORDER — SODIUM CHLORIDE 0.9 % IV SOLN
Freq: Once | INTRAVENOUS | Status: AC
  Administered 2018-09-08: 14:00:00 via INTRAVENOUS
  Filled 2018-09-08: qty 250

## 2018-09-08 MED ORDER — SODIUM CHLORIDE 0.9 % IV SOLN
620.0000 mg | Freq: Once | INTRAVENOUS | Status: AC
  Administered 2018-09-08: 620 mg via INTRAVENOUS
  Filled 2018-09-08: qty 2.4

## 2018-09-08 NOTE — Progress Notes (Signed)
[facility] OFFICE PROGRESS NOTE  [name], MD [place] [address]  DIAGNOSIS: Stage IIIA (T2a,N2, M0) non-small cell lung cancer,adenocarcinomapresenting with right lower lobe lung mass in addition to subcarinal lymphadenopathy and suspicious metastatic pulmonary nodules diagnosed in February 2019.  PRIOR THERAPY: Concurrent chemoradiation with carboplatin for an AUC of 2 with paclitaxel 45 mg/m weekly. First dosegiven on3/18/2019. Status post 7 cycles. Last dose was giving August 09, 2017 with partial response.  CURRENT THERAPY: Consolidation immunotherapy with Imfinzi (Durvalumab) 10 mg/KG every 2 weeks, first dose 09/23/2017. Status post 25cycles.  INTERVAL HISTORY: Katelyn Kennedy 73 y.o. female returns to the clinic today for a follow-up visit.  The patient is feeling well today without any concerning complaints.  She continues to tolerate her immunotherapy well without any adverse effects.  She denies any fevers, chills, night sweats, or weight loss.  She denies any chest pain, shortness of breath, cough, or hemoptysis.  She denies any nausea, vomiting, diarrhea, or constipation.  She denies any headache or visual changes.  She continues to report dry skin on her hands but denies any other rashes or skin changes.  She is here today for evaluation before starting her final treatment cycle with Imfinzi today as scheduled.  MEDICAL HISTORY: Past Medical History:  Diagnosis Date  . Allergic rhinitis   . Arthritis   . Diabetes mellitus without complication (HCC)    diet control/no meds per pt  . Dyspnea   . History of chicken pox   . lung ca dx'd 04/2017  . Smoker     ALLERGIES:  is allergic to codeine.  MEDICATIONS:  Current Outpatient Medications  Medication Sig Dispense Refill  . acetaminophen (TYLENOL) 500 MG tablet Take 500 mg by mouth daily as needed for moderate pain or headache.    Marland Kitchen atorvastatin (LIPITOR) 20 MG tablet  Take 1 tablet (20 mg total) by mouth daily. 90 tablet 3  . lisinopril (PRINIVIL,ZESTRIL) 5 MG tablet TAKE 1/2 TABLET BY MOUTH ONCE DAILY 45 tablet 1  . Multiple Vitamin (MULTIVITAMIN) capsule Take 1 capsule by mouth daily.      Vladimir Faster Glycol-Propyl Glycol (SYSTANE) 0.4-0.3 % SOLN Apply to eye.    . prochlorperazine (COMPAZINE) 10 MG tablet Take 1 tablet (10 mg total) by mouth every 6 (six) hours as needed for nausea or vomiting. (Patient not taking: Reported on 08/24/2018) 30 tablet 0  . traZODone (DESYREL) 50 MG tablet TAKE 1/2 TO 1 TABLET(25 TO 50 MG) BY MOUTH AT BEDTIME AS NEEDED FOR SLEEP 90 tablet 0   No current facility-administered medications for this visit.    Facility-Administered Medications Ordered in Other Visits  Medication Dose Route Frequency Provider Last Rate Last Dose  . durvalumab (IMFINZI) 560 mg in sodium chloride 0.9 % 100 mL chemo infusion  10 mg/kg (Treatment Plan Recorded) Intravenous Once Curt Bears, MD        SURGICAL HISTORY:  Past Surgical History:  Procedure Laterality Date  . ANKLE FRACTURE SURGERY Left 10 years ago   "hard to wake up from anesthesia" per pt  . [place]  . BREAST BIOPSY Right 1978   BENIGN CYST  . BREAST EXCISIONAL BIOPSY Left 2011   Benign  . BREAST EXCISIONAL BIOPSY Right 1985   Benign   . CATARACT EXTRACTION    . CATARACT EXTRACTION W/ INTRAOCULAR LENS IMPLANT Bilateral   . COLONOSCOPY  2011  . ELECTROMAGNETIC NAVIGATION BROCHOSCOPY N/A 06/08/2017  Procedure: ELECTROMAGNETIC NAVIGATION BRONCHOSCOPY;  Surgeon: Flora Lipps, MD;  Location: [facility] ORS;  Service: Cardiopulmonary;  Laterality: N/A;  . POLYPECTOMY  2011  . 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 TUBAL LIGATION      REVIEW OF SYSTEMS:   Review of  Systems  Constitutional: Negative for appetite change, chills, fatigue, fever and unexpected weight change.  HENT:   Negative for mouth sores, nosebleeds, sore throat and trouble swallowing.   Eyes: Negative for eye problems and icterus.  Respiratory: Negative for cough, hemoptysis, shortness of breath and wheezing.   Cardiovascular: Negative for chest pain and leg swelling.  Gastrointestinal: Negative for abdominal pain, constipation, diarrhea, nausea and vomiting.  Genitourinary: Negative for bladder incontinence, difficulty urinating, dysuria, frequency and hematuria.   Musculoskeletal: Negative for back pain, gait problem, neck pain and neck stiffness.  Skin: Negative for itching and rash.  Neurological: Negative for dizziness, extremity weakness, gait problem, headaches, light-headedness and seizures.  Hematological: Negative for adenopathy. Does not bruise/bleed easily.  Psychiatric/Behavioral: Negative for confusion, depression and sleep disturbance. The patient is not nervous/anxious.     PHYSICAL EXAMINATION:  Blood pressure 106/60, pulse 92, temperature 98.2 F (36.8 C), temperature source Oral, resp. rate 18, height 4\' 11"  (1.499 m), weight 133 lb 3.2 oz (60.4 kg), SpO2 96 %.  ECOG PERFORMANCE STATUS: 1 - Symptomatic but completely ambulatory  Physical Exam  Constitutional: Oriented to person, place, and time and well-developed, well-nourished, and in no distress. No distress.  HENT:  Head: Normocephalic and atraumatic.  Mouth/Throat: Oropharynx is clear and moist. No oropharyngeal exudate.  Eyes: Conjunctivae are normal. Right eye exhibits no discharge. Left eye exhibits no discharge. No scleral icterus.  Neck: Normal range of motion. Neck supple.  Cardiovascular: Normal rate, regular rhythm, normal heart sounds and intact distal pulses.   Pulmonary/Chest: Effort normal and breath sounds normal. No respiratory distress. No wheezes. No rales.  Abdominal: Soft. Bowel sounds  are normal. Exhibits no distension and no mass. There is no tenderness.  Musculoskeletal: Normal range of motion. Exhibits no edema.  Lymphadenopathy:    No cervical adenopathy.  Neurological: Alert and oriented to person, place, and time. Exhibits normal muscle tone. Gait normal. Coordination normal.  Skin: Skin is warm and dry. No rash noted. Not diaphoretic. No erythema. No pallor.  Psychiatric: Mood, memory and judgment normal.  Vitals reviewed.  LABORATORY DATA: Lab Results  Component Value Date   WBC 5.6 09/08/2018   HGB 13.7 09/08/2018   HCT 40.3 09/08/2018   MCV 94.4 09/08/2018   PLT 233 09/08/2018      Chemistry      Component Value Date/Time   NA 139 09/08/2018 1259   K 4.4 09/08/2018 1259   CL 107 09/08/2018 1259   CO2 24 09/08/2018 1259   BUN 15 09/08/2018 1259   CREATININE 0.86 09/08/2018 1259      Component Value Date/Time   CALCIUM 10.0 09/08/2018 1259   ALKPHOS 99 09/08/2018 1259   AST 20 09/08/2018 1259   ALT 19 09/08/2018 1259   BILITOT 0.5 09/08/2018 1259       RADIOGRAPHIC STUDIES:  No results found.   ASSESSMENT/PLAN:  This is a very pleasant 73 year old Caucasian female with stage IIIa non-small cell lung cancer, adenocarcinoma.  She presented with a right lower lobe lung mass and  subcarinal lymphadenopathy and pulmonary nodules which are suspicious for metastatic disease.  She was diagnosed in February 2019. She completed a course of concurrent chemoradiation with weekly carboplatin and paclitaxel.  She tolerated this treatment well with a partial response. She is currently undergoing consolidation immunotherapy with Imfinzi 10 mg/kg IV every 2 weeks.  She is status post 25 cycles.  She has been tolerating this treatment well without any adverse effects. Labs were reviewed with the patient today.  I recommend that she proceed with her final cycle, #26, of immunotherapy today as scheduled. I will arrange for restaging CT scans of the chest to be  performed in about 2-3 weeks. We will see her back for a follow-up visit in 3 weeks for evaluation and to review her scan results.  The patient was advised to call immediately if she has any concerning symptoms in the interval. The patient voices understanding of current disease status and treatment options and is in agreement with the current care plan. All questions were answered. The patient knows to call the clinic with any problems, questions or concerns. We can certainly see the patient much sooner if necessary   Orders Placed This Encounter  Procedures  . CT Chest W Contrast    Standing Status:   Future    Standing Expiration Date:   09/08/2019    Order Specific Question:   ** REASON FOR EXAM (FREE TEXT)    Answer:   restaging lung cancer    Order Specific Question:   If indicated for the ordered procedure, I authorize the administration of contrast media per Radiology protocol    Answer:   Yes    Order Specific Question:   Preferred imaging location?    Answer:   [facility]    Order Specific Question:   Radiology Contrast Protocol - do NOT remove file path    Answer:   [email or web address]     [place], PA-C 09/08/18

## 2018-09-08 NOTE — Patient Instructions (Signed)
[facility] Discharge Instructions for Patients Receiving Chemotherapy  Today you received the following chemotherapy agent: Imfinzi.  To help prevent nausea and vomiting after your treatment, we encourage you to take your nausea medication as directed.    If you develop nausea and vomiting that is not controlled by your nausea medication, call the clinic.   BELOW ARE SYMPTOMS THAT SHOULD BE REPORTED IMMEDIATELY:  *FEVER GREATER THAN 100.5 F  *CHILLS WITH OR WITHOUT FEVER  NAUSEA AND VOMITING THAT IS NOT CONTROLLED WITH YOUR NAUSEA MEDICATION  *UNUSUAL SHORTNESS OF BREATH  *UNUSUAL BRUISING OR BLEEDING  TENDERNESS IN MOUTH AND THROAT WITH OR WITHOUT PRESENCE OF ULCERS  *URINARY PROBLEMS  *BOWEL PROBLEMS  UNUSUAL RASH Items with * indicate a potential emergency and should be followed up as soon as possible.  Feel free to call the clinic should you have any questions or concerns. The clinic phone number is [phone].  Please show the [facility] at check-in to the Emergency Department and triage nurse.  Coronavirus (COVID-19) Are you at risk?  Are you at risk for the Coronavirus (COVID-19)?  To be considered HIGH RISK for Coronavirus (COVID-19), you have to meet the following criteria:  . Traveled to [place], [place], [place], [place] or [place]; or in the [place] to [place], [place], [place], or [place]; and have fever, cough, and shortness of breath within the last 2 weeks of travel OR . Been in close contact with a person diagnosed with COVID-19 within the last 2 weeks and have fever, cough, and shortness of breath . IF YOU DO NOT MEET THESE CRITERIA, YOU ARE CONSIDERED LOW RISK FOR COVID-19.  What to do if you are HIGH RISK for COVID-19?  Marland Kitchen If you are having a medical emergency, call 911. . Seek medical care right away. Before you go to a doctor's office, urgent care or emergency department, call ahead and tell them about your  recent travel, contact with someone diagnosed with COVID-19, and your symptoms. You should receive instructions from your physician's office regarding next steps of care.  . When you arrive at healthcare provider, tell the healthcare staff immediately you have returned from visiting [place], [place], [place], [place] or [place]; or traveled in the [place] to [place], [place], [place], or [place]; in the last two weeks or you have been in close contact with a person diagnosed with COVID-19 in the last 2 weeks.   . Tell the health care staff about your symptoms: fever, cough and shortness of breath. . After you have been seen by a medical provider, you will be either: o Tested for (COVID-19) and discharged home on quarantine except to seek medical care if symptoms worsen, and asked to  - Stay home and avoid contact with others until you get your results (4-5 days)  - Avoid travel on public transportation if possible (such as bus, train, or airplane) or o Sent to the Emergency Department by EMS for evaluation, COVID-19 testing, and possible admission depending on your condition and test results.  What to do if you are LOW RISK for COVID-19?  Reduce your risk of any infection by using the same precautions used for avoiding the common cold or flu:  Marland Kitchen Wash your hands often with soap and warm water for at least 20 seconds.  If soap and water are not readily available, use an alcohol-based hand sanitizer with at least 60% alcohol.  . If coughing or  sneezing, cover your mouth and nose by coughing or sneezing into the elbow areas of your shirt or coat, into a tissue or into your sleeve (not your hands). . Avoid shaking hands with others and consider head nods or verbal greetings only. . Avoid touching your eyes, nose, or mouth with unwashed hands.  . Avoid close contact with people who are sick. . Avoid places or events with large numbers of people in one location, like concerts or sporting  events. . Carefully consider travel plans you have or are making. . If you are planning any travel outside or inside the [name], visit the CDC's Travelers' Health webpage for the latest health notices. . If you have some symptoms but not all symptoms, continue to monitor at home and seek medical attention if your symptoms worsen. . If you are having a medical emergency, call 911.   [facility] / e-Visit: [email or web address]         [facility]: [facility]: [phone]                   [facility]: [phone]

## 2018-09-09 ENCOUNTER — Telehealth: Payer: Self-pay | Admitting: Internal Medicine

## 2018-09-09 NOTE — Telephone Encounter (Signed)
Scheduled appt per 5/28 los - unable to reach patient The patient left the office before the visit was finished. Left Message with appt date and time

## 2018-09-20 ENCOUNTER — Ambulatory Visit (INDEPENDENT_AMBULATORY_CARE_PROVIDER_SITE_OTHER): Payer: PPO | Admitting: Family Medicine

## 2018-09-20 ENCOUNTER — Telehealth: Payer: Self-pay | Admitting: Family Medicine

## 2018-09-20 ENCOUNTER — Encounter: Payer: Self-pay | Admitting: Family Medicine

## 2018-09-20 ENCOUNTER — Other Ambulatory Visit: Payer: Self-pay

## 2018-09-20 VITALS — BP 92/60 | HR 106 | Temp 98.5°F | Ht 59.0 in | Wt 132.0 lb

## 2018-09-20 DIAGNOSIS — R300 Dysuria: Secondary | ICD-10-CM

## 2018-09-20 DIAGNOSIS — N3000 Acute cystitis without hematuria: Secondary | ICD-10-CM | POA: Diagnosis not present

## 2018-09-20 DIAGNOSIS — N390 Urinary tract infection, site not specified: Secondary | ICD-10-CM | POA: Insufficient documentation

## 2018-09-20 LAB — POC URINALSYSI DIPSTICK (AUTOMATED)
Bilirubin, UA: NEGATIVE
Glucose, UA: NEGATIVE
Ketones, UA: NEGATIVE
Nitrite, UA: NEGATIVE
Protein, UA: NEGATIVE
Spec Grav, UA: 1.01 (ref 1.010–1.025)
Urobilinogen, UA: 0.2 E.U./dL
pH, UA: 6 (ref 5.0–8.0)

## 2018-09-20 LAB — POCT UA - MICROSCOPIC ONLY

## 2018-09-20 MED ORDER — SULFAMETHOXAZOLE-TRIMETHOPRIM 800-160 MG PO TABS: 1.0000 | ORAL_TABLET | Freq: Two times a day (BID) | ORAL | 0 refills | Status: DC

## 2018-09-20 NOTE — Telephone Encounter (Signed)
I spoke with pt; pt has frequency of urine with pain upon urination for approx 1 wk, urine cloudy, no blood, no fever,chills,S/T,cough,SOB,muscle pain,diarrhea,h/a, no loss of taste or smell  No travel and no known exposure to covid or flu.pt said would be 1 hr round trip for pt to bring urine specimen to office and pt prefers in office visit; pt has had last immuno therapy 2 wks ago. Advised pt should probably do virtual visit but pt said she will wear a mask and wants in office appt. FYI to Dr Diona Browner and Butch Penny CMA.

## 2018-09-20 NOTE — Telephone Encounter (Signed)
Patient has Possible UTI.  Best Ph # [phone]

## 2018-09-20 NOTE — Patient Instructions (Signed)
Urinary Tract Infection, Adult A urinary tract infection (UTI) is an infection of any part of the urinary tract. The urinary tract includes:  The kidneys.  The ureters.  The bladder.  The urethra. These organs make, store, and get rid of pee (urine) in the body. What are the causes? This is caused by germs (bacteria) in your genital area. These germs grow and cause swelling (inflammation) of your urinary tract. What increases the risk? You are more likely to develop this condition if:  You have a small, thin tube (catheter) to drain pee.  You cannot control when you pee or poop (incontinence).  You are female, and: ? You use these methods to prevent pregnancy: ? A medicine that kills sperm (spermicide). ? A device that blocks sperm (diaphragm). ? You have low levels of a female hormone (estrogen). ? You are pregnant.  You have genes that add to your risk.  You are sexually active.  You take antibiotic medicines.  You have trouble peeing because of: ? A prostate that is bigger than normal, if you are female. ? A blockage in the part of your body that drains pee from the bladder (urethra). ? A kidney stone. ? A nerve condition that affects your bladder (neurogenic bladder). ? Not getting enough to drink. ? Not peeing often enough.  You have other conditions, such as: ? Diabetes. ? A weak disease-fighting system (immune system). ? Sickle cell disease. ? Gout. ? Injury of the spine. What are the signs or symptoms? Symptoms of this condition include:  Needing to pee right away (urgently).  Peeing often.  Peeing small amounts often.  Pain or burning when peeing.  Blood in the pee.  Pee that smells bad or not like normal.  Trouble peeing.  Pee that is cloudy.  Fluid coming from the vagina, if you are female.  Pain in the belly or lower back. Other symptoms include:  Throwing up (vomiting).  No urge to eat.  Feeling mixed up (confused).  Being tired  and grouchy (irritable).  A fever.  Watery poop (diarrhea). How is this treated? This condition may be treated with:  Antibiotic medicine.  Other medicines.  Drinking enough water. Follow these instructions at home:  Medicines  Take over-the-counter and prescription medicines only as told by your doctor.  If you were prescribed an antibiotic medicine, take it as told by your doctor. Do not stop taking it even if you start to feel better. General instructions  Make sure you: ? Pee until your bladder is empty. ? Do not hold pee for a long time. ? Empty your bladder after sex. ? Wipe from front to back after pooping if you are a female. Use each tissue one time when you wipe.  Drink enough fluid to keep your pee pale yellow.  Keep all follow-up visits as told by your doctor. This is important. Contact a doctor if:  You do not get better after 1-2 days.  Your symptoms go away and then come back. Get help right away if:  You have very bad back pain.  You have very bad pain in your lower belly.  You have a fever.  You are sick to your stomach (nauseous).  You are throwing up. Summary  A urinary tract infection (UTI) is an infection of any part of the urinary tract.  This condition is caused by germs in your genital area.  There are many risk factors for a UTI. These include having a small, thin   tube to drain pee and not being able to control when you pee or poop.  Treatment includes antibiotic medicines for germs.  Drink enough fluid to keep your pee pale yellow. This information is not intended to replace advice given to you by your health care provider. Make sure you discuss any questions you have with your health care provider. Document Released: 09/16/2007 Document Revised: 10/07/2017 Document Reviewed: 10/07/2017 Elsevier Interactive Patient Education  2019 Elsevier Inc.  

## 2018-09-20 NOTE — Progress Notes (Signed)
Chief Complaint  Patient presents with  . Burning with urination    History of Present Illness: Dysuria   This is a new problem. The current episode started in the past 7 days. The problem has been gradually worsening. The quality of the pain is described as burning. The pain is moderate. There has been no fever. She is not sexually active. There is a history of pyelonephritis. Associated symptoms include frequency. Pertinent negatives include no chills, discharge, flank pain, hematuria, hesitancy, nausea, urgency or vomiting. She has tried nothing for the symptoms. There is no history of catheterization, recurrent UTIs, a single kidney, urinary stasis or a urological procedure. hx of bladder cancer   Hx of bladder cancer completed in 04/2018 Hx of lung cancer complete immunotherapy treatment recently  Urinalysis    Component Value Date/Time   APPEARANCEUR Clear 05/25/2018 1046   GLUCOSEU Negative 05/25/2018 1046   BILIRUBINUR Negative 09/20/2018 1556   BILIRUBINUR Negative 05/25/2018 1046   PROTEINUR Negative 09/20/2018 1556   PROTEINUR Negative 05/25/2018 1046   UROBILINOGEN 0.2 09/20/2018 1556   NITRITE Negative 09/20/2018 1556   NITRITE Negative 05/25/2018 1046   LEUKOCYTESUR Small (1+) (A) 09/20/2018 1556   LEUKOCYTESUR Negative 05/25/2018 1046     COVID 19 screen No recent travel or known exposure to [place] The patient denies respiratory symptoms of COVID 19 at this time.  The importance of social distancing was discussed today.   Review of Systems  Constitutional: Negative for chills.  Gastrointestinal: Negative for nausea and vomiting.  Genitourinary: Positive for dysuria and frequency. Negative for flank pain, hematuria, hesitancy and urgency.      Past Medical History:  Diagnosis Date  . Allergic rhinitis   . Arthritis   . Diabetes mellitus without complication (HCC)    diet control/no meds per pt  . Dyspnea   . History of chicken pox   . lung ca dx'd 04/2017   . Smoker     reports that she quit smoking about 16 months ago. Her smoking use included cigarettes. She has a 20.00 pack-year smoking history. She has never used smokeless tobacco. She reports current alcohol use. She reports that she does not use drugs.   Current Outpatient Medications:  .  acetaminophen (TYLENOL) 500 MG tablet, Take 500 mg by mouth daily as needed for moderate pain or headache., Disp: , Rfl:  .  atorvastatin (LIPITOR) 20 MG tablet, Take 1 tablet (20 mg total) by mouth daily., Disp: 90 tablet, Rfl: 3 .  lisinopril (PRINIVIL,ZESTRIL) 5 MG tablet, TAKE 1/2 TABLET BY MOUTH ONCE DAILY, Disp: 45 tablet, Rfl: 1 .  Multiple Vitamin (MULTIVITAMIN) capsule, Take 1 capsule by mouth daily.  , Disp: , Rfl:  .  Polyethyl Glycol-Propyl Glycol (SYSTANE) 0.4-0.3 % SOLN, Apply to eye., Disp: , Rfl:  .  traZODone (DESYREL) 50 MG tablet, TAKE 1/2 TO 1 TABLET(25 TO 50 MG) BY MOUTH AT BEDTIME AS NEEDED FOR SLEEP, Disp: 90 tablet, Rfl: 0   Observations/Objective: Blood pressure 92/60, pulse (!) 106, temperature 98.5 F (36.9 C), temperature source Oral, height 4\' 11"  (1.499 m), weight 132 lb (59.9 kg).  Physical Exam Constitutional:      General: She is not in acute distress.    Appearance: Normal appearance. She is well-developed. She is not ill-appearing or toxic-appearing.  HENT:     Head: Normocephalic.     Right Ear: Hearing, tympanic membrane, ear canal and external ear normal. Tympanic membrane is not erythematous, retracted or bulging.  Left Ear: Hearing, tympanic membrane, ear canal and external ear normal. Tympanic membrane is not erythematous, retracted or bulging.     Nose: No mucosal edema or rhinorrhea.     Right Sinus: No maxillary sinus tenderness or frontal sinus tenderness.     Left Sinus: No maxillary sinus tenderness or frontal sinus tenderness.     Mouth/Throat:     Pharynx: Uvula midline.  Eyes:     General: Lids are normal. Lids are everted, no foreign bodies  appreciated.     Conjunctiva/sclera: Conjunctivae normal.     Pupils: Pupils are equal, round, and reactive to light.  Neck:     Musculoskeletal: Normal range of motion and neck supple.     Thyroid: No thyroid mass or thyromegaly.     Vascular: No carotid bruit.     Trachea: Trachea normal.  Cardiovascular:     Rate and Rhythm: Normal rate and regular rhythm.     Pulses: Normal pulses.     Heart sounds: Normal heart sounds, S1 normal and S2 normal. No murmur. No friction rub. No gallop.   Pulmonary:     Effort: Pulmonary effort is normal. No tachypnea or respiratory distress.     Breath sounds: Normal breath sounds. No decreased breath sounds, wheezing, rhonchi or rales.  Abdominal:     General: Bowel sounds are normal.     Palpations: Abdomen is soft.     Tenderness: There is no abdominal tenderness.  Skin:    General: Skin is warm and dry.     Findings: No rash.  Neurological:     Mental Status: She is alert.  Psychiatric:        Mood and Affect: Mood is not anxious or depressed.        Speech: Speech normal.        Behavior: Behavior normal. Behavior is cooperative.        Thought Content: Thought content normal.        Judgment: Judgment normal.      Assessment and Plan      Eliezer Lofts, MD

## 2018-09-20 NOTE — Assessment & Plan Note (Signed)
Send for culture. Treat with antibiotics. Increase water.

## 2018-09-20 NOTE — Telephone Encounter (Signed)
I office visit okay.

## 2018-09-21 LAB — URINE CULTURE
MICRO NUMBER:: 551340
Result:: NO GROWTH
SPECIMEN QUALITY:: ADEQUATE

## 2018-09-26 ENCOUNTER — Other Ambulatory Visit: Payer: Self-pay | Admitting: Medical Oncology

## 2018-09-26 DIAGNOSIS — C3431 Malignant neoplasm of lower lobe, right bronchus or lung: Secondary | ICD-10-CM

## 2018-09-27 ENCOUNTER — Ambulatory Visit (INDEPENDENT_AMBULATORY_CARE_PROVIDER_SITE_OTHER): Payer: PPO

## 2018-09-27 ENCOUNTER — Inpatient Hospital Stay: Payer: PPO | Attending: Internal Medicine

## 2018-09-27 ENCOUNTER — Ambulatory Visit (HOSPITAL_COMMUNITY)
Admission: RE | Admit: 2018-09-27 | Discharge: 2018-09-27 | Disposition: A | Discharge status: Home / Self Care | Payer: PPO | Source: Ambulatory Visit | Attending: Physician Assistant | Admitting: Physician Assistant

## 2018-09-27 ENCOUNTER — Telehealth: Payer: Self-pay | Admitting: Family Medicine

## 2018-09-27 ENCOUNTER — Other Ambulatory Visit: Payer: Self-pay

## 2018-09-27 ENCOUNTER — Encounter (HOSPITAL_COMMUNITY): Payer: Self-pay

## 2018-09-27 VITALS — Temp 98.5°F

## 2018-09-27 DIAGNOSIS — E041 Nontoxic single thyroid nodule: Secondary | ICD-10-CM | POA: Diagnosis not present

## 2018-09-27 DIAGNOSIS — C3431 Malignant neoplasm of lower lobe, right bronchus or lung: Secondary | ICD-10-CM | POA: Diagnosis not present

## 2018-09-27 DIAGNOSIS — E042 Nontoxic multinodular goiter: Secondary | ICD-10-CM | POA: Insufficient documentation

## 2018-09-27 DIAGNOSIS — R918 Other nonspecific abnormal finding of lung field: Secondary | ICD-10-CM | POA: Diagnosis not present

## 2018-09-27 DIAGNOSIS — M810 Age-related osteoporosis without current pathological fracture: Secondary | ICD-10-CM

## 2018-09-27 DIAGNOSIS — I700 Atherosclerosis of aorta: Secondary | ICD-10-CM | POA: Diagnosis not present

## 2018-09-27 DIAGNOSIS — K769 Liver disease, unspecified: Secondary | ICD-10-CM | POA: Insufficient documentation

## 2018-09-27 DIAGNOSIS — Z79899 Other long term (current) drug therapy: Secondary | ICD-10-CM | POA: Insufficient documentation

## 2018-09-27 DIAGNOSIS — Z0000 Encounter for general adult medical examination without abnormal findings: Secondary | ICD-10-CM

## 2018-09-27 DIAGNOSIS — K449 Diaphragmatic hernia without obstruction or gangrene: Secondary | ICD-10-CM | POA: Insufficient documentation

## 2018-09-27 DIAGNOSIS — J701 Chronic and other pulmonary manifestations due to radiation: Secondary | ICD-10-CM | POA: Insufficient documentation

## 2018-09-27 DIAGNOSIS — Z9221 Personal history of antineoplastic chemotherapy: Secondary | ICD-10-CM | POA: Insufficient documentation

## 2018-09-27 DIAGNOSIS — R0602 Shortness of breath: Secondary | ICD-10-CM | POA: Insufficient documentation

## 2018-09-27 DIAGNOSIS — E119 Type 2 diabetes mellitus without complications: Secondary | ICD-10-CM

## 2018-09-27 DIAGNOSIS — I2510 Atherosclerotic heart disease of native coronary artery without angina pectoris: Secondary | ICD-10-CM | POA: Insufficient documentation

## 2018-09-27 DIAGNOSIS — Z923 Personal history of irradiation: Secondary | ICD-10-CM | POA: Insufficient documentation

## 2018-09-27 DIAGNOSIS — F17210 Nicotine dependence, cigarettes, uncomplicated: Secondary | ICD-10-CM | POA: Insufficient documentation

## 2018-09-27 LAB — CMP (CANCER CENTER ONLY)
ALT: 21 U/L (ref 0–44)
AST: 23 U/L (ref 15–41)
Albumin: 4.1 g/dL (ref 3.5–5.0)
Alkaline Phosphatase: 104 U/L (ref 38–126)
Anion gap: 13 (ref 5–15)
BUN: 15 mg/dL (ref 8–23)
CO2: 22 mmol/L (ref 22–32)
Calcium: 9.9 mg/dL (ref 8.9–10.3)
Chloride: 106 mmol/L (ref 98–111)
Creatinine: 0.86 mg/dL (ref 0.44–1.00)
GFR, Est AFR Am: >60 mL/min (ref >60)
GFR, Estimated: >60 mL/min (ref >60)
Glucose, Bld: 91 mg/dL (ref 70–99)
Potassium: 4.5 mmol/L (ref 3.5–5.1)
Sodium: 141 mmol/L (ref 135–145)
Total Bilirubin: 0.3 mg/dL (ref 0.3–1.2)
Total Protein: 7.3 g/dL (ref 6.5–8.1)

## 2018-09-27 LAB — CBC WITH DIFFERENTIAL (CANCER CENTER ONLY)
Abs Immature Granulocytes: 0.04 10*3/uL (ref 0.00–0.07)
Basophils Absolute: 0 10*3/uL (ref 0.0–0.1)
Basophils Relative: 0 %
Eosinophils Absolute: 0 10*3/uL (ref 0.0–0.5)
Eosinophils Relative: 1 %
HCT: 40.8 % (ref 36.0–46.0)
Hemoglobin: 13.5 g/dL (ref 12.0–15.0)
Immature Granulocytes: 1 %
Lymphocytes Relative: 23 %
Lymphs Abs: 1.3 10*3/uL (ref 0.7–4.0)
MCH: 31.5 pg (ref 26.0–34.0)
MCHC: 33.1 g/dL (ref 30.0–36.0)
MCV: 95.3 fL (ref 80.0–100.0)
Monocytes Absolute: 0.6 10*3/uL (ref 0.1–1.0)
Monocytes Relative: 11 %
Neutro Abs: 3.7 10*3/uL (ref 1.7–7.7)
Neutrophils Relative %: 64 %
Platelet Count: 282 10*3/uL (ref 150–400)
RBC: 4.28 MIL/uL (ref 3.87–5.11)
RDW: 12.7 % (ref 11.5–15.5)
WBC Count: 5.8 10*3/uL (ref 4.0–10.5)
nRBC: 0 % (ref 0.0–0.2)

## 2018-09-27 LAB — TSH: TSH: 1.837 u[IU]/mL (ref 0.308–3.960)

## 2018-09-27 MED ORDER — SODIUM CHLORIDE (PF) 0.9 % IJ SOLN
INTRAMUSCULAR | Status: AC
  Filled 2018-09-27: qty 50

## 2018-09-27 MED ORDER — IOHEXOL 300 MG/ML  SOLN
75.0000 mL | Freq: Once | INTRAMUSCULAR | Status: AC | PRN
  Administered 2018-09-27: 75 mL via INTRAVENOUS

## 2018-09-27 NOTE — Patient Instructions (Signed)
Katelyn Kennedy , Thank you for taking time to come for your Medicare Wellness Visit. I appreciate your ongoing commitment to your health goals. Please review the following plan we discussed and let me know if I can assist you in the future.   These are the goals we discussed: Goals    . Patient Stated     Starting 09/27/2018, I will continue to take medications as prescribed.        This is a list of the screening recommended for you and due dates:  Health Maintenance  Topic Date Due  . Hemoglobin A1C  09/28/2018*  . Mammogram  04/13/2019*  . Flu Shot  11/12/2018  . Eye exam for diabetics  01/23/2019  . Complete foot exam   02/25/2019  . Tetanus Vaccine  09/04/2019  . Colon Cancer Screening  01/20/2022  . DEXA scan (bone density measurement)  Completed  .  Hepatitis C: One time screening is recommended by Center for Disease Control  (CDC) for  adults born from [birth year] through 1967.   Completed  . Pneumonia vaccines  Completed  *Topic was postponed. The date shown is not the original due date.   Preventive Care for Adults  A healthy lifestyle and preventive care can promote health and wellness. Preventive health guidelines for adults include the following key practices.  . A routine yearly physical is a good way to check with your health care provider about your health and preventive screening. It is a chance to share any concerns and updates on your health and to receive a thorough exam.  . Visit your dentist for a routine exam and preventive care every 6 months. Brush your teeth twice a day and floss once a day. Good oral hygiene prevents tooth decay and gum disease.  . The frequency of eye exams is based on your age, health, family medical history, use  of contact lenses, and other factors. Follow your health care provider's recommendations for frequency of eye exams.  . Eat a healthy diet. Foods like vegetables, fruits, whole grains, low-fat dairy products, and lean protein foods  contain the nutrients you need without too many calories. Decrease your intake of foods high in solid fats, added sugars, and salt. Eat the right amount of calories for you. Get information about a proper diet from your health care provider, if necessary.  . Regular physical exercise is one of the most important things you can do for your health. Most adults should get at least 150 minutes of moderate-intensity exercise (any activity that increases your heart rate and causes you to sweat) each week. In addition, most adults need muscle-strengthening exercises on 2 or more days a week.  Silver Sneakers may be a benefit available to you. To determine eligibility, you may visit the website: www.silversneakers.com or contact program at (484)791-0929 Mon-Fri between 8AM-8PM.   . Maintain a healthy weight. The body mass index (BMI) is a screening tool to identify possible weight problems. It provides an estimate of body fat based on height and weight. Your health care provider can find your BMI and can help you achieve or maintain a healthy weight.   For adults 20 years and older: ? A BMI below 18.5 is considered underweight. ? A BMI of 18.5 to 24.9 is normal. ? A BMI of 25 to 29.9 is considered overweight. ? A BMI of 30 and above is considered obese.   . Maintain normal blood lipids and cholesterol levels by exercising and minimizing your intake  of saturated fat. Eat a balanced diet with plenty of fruit and vegetables. Blood tests for lipids and cholesterol should begin at age 62 and be repeated every 5 years. If your lipid or cholesterol levels are high, you are over 50, or you are at high risk for heart disease, you may need your cholesterol levels checked more frequently. Ongoing high lipid and cholesterol levels should be treated with medicines if diet and exercise are not working.  . If you smoke, find out from your health care provider how to quit. If you do not use tobacco, please do not  start.  . If you choose to drink alcohol, please do not consume more than 2 drinks per day. One drink is considered to be 12 ounces (355 mL) of beer, 5 ounces (148 mL) of wine, or 1.5 ounces (44 mL) of liquor.  . If you are 52-35 years old, ask your health care provider if you should take aspirin to prevent strokes.  . Use sunscreen. Apply sunscreen liberally and repeatedly throughout the day. You should seek shade when your shadow is shorter than you. Protect yourself by wearing long sleeves, pants, a wide-brimmed hat, and sunglasses year round, whenever you are outdoors.  . Once a month, do a whole body skin exam, using a mirror to look at the skin on your back. Tell your health care provider of new moles, moles that have irregular borders, moles that are larger than a pencil eraser, or moles that have changed in shape or color.

## 2018-09-27 NOTE — Telephone Encounter (Signed)
-----   Message from Cloyd Stagers, RT sent at 09/22/2018  3:14 PM EDT ----- Regarding: Lab Orders for Wednesday 6.17.2020 Please place lab orders for Wednesday 6.17.2020, phone visit for physical on Friday 6.19.2020 Thank you, Dyke Maes RT(R)

## 2018-09-27 NOTE — Progress Notes (Signed)
PCP notes:   Health maintenance:  A1C - ordered Mammogram - to be scheduled  Abnormal screenings:   None  Patient concerns:   None  Nurse concerns:  None  Next PCP appt:   09/30/18 @ 1140

## 2018-09-27 NOTE — Progress Notes (Signed)
Subjective:   Katelyn Kennedy is a 73 y.o. female who presents for Medicare Annual (Subsequent) preventive examination.  Review of Systems:  N/A Cardiac Risk Factors include: advanced age (>42men, >49 women);dyslipidemia;diabetes mellitus;microalbuminuria     Objective:     Vitals: Temp 98.5 F (36.9 C) (Oral) Comment: patient supplied  There is no height or weight on file to calculate BMI.  Advanced Directives 09/27/2018 06/29/2018 06/15/2018 05/18/2018 04/21/2018 03/24/2018 02/10/2018  Does Patient Have a Medical Advance Directive? No No No No No No No  Would patient like information on creating a medical advance directive? No - Patient declined - - - - - -    Tobacco Social History   Tobacco Use  Smoking Status Former Smoker  . Packs/day: 0.50  . Years: 40.00  . Pack years: 20.00  . Types: Cigarettes  . Quit date: 04/29/2017  . Years since quitting: 1.4  Smokeless Tobacco Never Used     Counseling given: No   Clinical Intake:  Pre-visit preparation completed: Yes  Pain : No/denies pain Pain Score: 0-No pain     Nutritional Status: BMI of 19-24  Normal Nutritional Risks: None Diabetes: Yes CBG done?: No Did pt. bring in CBG monitor from home?: No  How often do you need to have someone help you when you read instructions, pamphlets, or other written materials from your doctor or pharmacy?: 1 - Never What is the last grade level you completed in school?: 12th grade  Interpreter Needed?: No  Comments: pt is divorced and lives independently Information entered by :: LPinson, LPN  Past Medical History:  Diagnosis Date  . Allergic rhinitis   . Arthritis   . Diabetes mellitus without complication (HCC)    diet control/no meds per pt  . Dyspnea   . History of chicken pox   . lung ca dx'd 04/2017  . Smoker    Past Surgical History:  Procedure Laterality Date  . ANKLE FRACTURE SURGERY Left 10 years ago   "hard to wake up from anesthesia" per pt  . [place]  . BREAST BIOPSY Right 1978   BENIGN CYST  . BREAST EXCISIONAL BIOPSY Left 2011   Benign  . BREAST EXCISIONAL BIOPSY Right 1985   Benign   . CATARACT EXTRACTION    . CATARACT EXTRACTION W/ INTRAOCULAR LENS IMPLANT Bilateral   . COLONOSCOPY  2011  . ELECTROMAGNETIC NAVIGATION BROCHOSCOPY N/A 06/08/2017   Procedure: ELECTROMAGNETIC NAVIGATION BRONCHOSCOPY;  Surgeon: Flora Lipps, MD;  Location: [facility] ORS;  Service: Cardiopulmonary;  Laterality: N/A;  . POLYPECTOMY  2011  . 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 TUBAL LIGATION     Family History  Problem Relation Age of Onset  . Diabetes Mother   . Cancer Sister 75       BREAST  . Breast cancer Sister   . Cancer Maternal Grandmother        COLON  . Colon cancer Maternal Grandmother 86  . Stomach cancer Neg Hx    Social History   Socioeconomic History  . Marital status: Divorced    Spouse name: Not on file  . Number of children: Not on file  . Years of education: Not on file  . Highest education level: Not on file  Occupational History  . Not on  file  Social Needs  . Financial resource strain: Not on file  . Food insecurity    Worry: Not on file    Inability: Not on file  . Transportation needs    Medical: Not on file    Non-medical: Not on file  Tobacco Use  . Smoking status: Former Smoker    Packs/day: 0.50    Years: 40.00    Pack years: 20.00    Types: Cigarettes    Quit date: 04/29/2017    Years since quitting: 1.4  . Smokeless tobacco: Never Used  Substance and Sexual Activity  . Alcohol use: Yes    Comment: occassionally  . Drug use: No  . Sexual activity: Not Currently  Lifestyle  . Physical activity    Days per week: Not on file    Minutes per session: Not on file  . Stress: Not on file   Relationships  . Social Herbalist on phone: Not on file    Gets together: Not on file    Attends religious service: Not on file    Active member of club or organization: Not on file    Attends meetings of clubs or organizations: Not on file    Relationship status: Not on file  Other Topics Concern  . Not on file  Social History Narrative  . Not on file    Outpatient Encounter Medications as of 09/27/2018  Medication Sig  . acetaminophen (TYLENOL) 500 MG tablet Take 500 mg by mouth daily as needed for moderate pain or headache.  Marland Kitchen atorvastatin (LIPITOR) 20 MG tablet Take 1 tablet (20 mg total) by mouth daily.  Marland Kitchen lisinopril (PRINIVIL,ZESTRIL) 5 MG tablet TAKE 1/2 TABLET BY MOUTH ONCE DAILY  . Multiple Vitamin (MULTIVITAMIN) capsule Take 1 capsule by mouth daily.    Vladimir Faster Glycol-Propyl Glycol (SYSTANE) 0.4-0.3 % SOLN Apply to eye.  . traZODone (DESYREL) 50 MG tablet TAKE 1/2 TO 1 TABLET(25 TO 50 MG) BY MOUTH AT BEDTIME AS NEEDED FOR SLEEP  . [DISCONTINUED] sulfamethoxazole-trimethoprim (BACTRIM DS) 800-160 MG tablet Take 1 tablet by mouth 2 (two) times daily.   No facility-administered encounter medications on file as of 09/27/2018.     Activities of Daily Living In your present state of health, do you have any difficulty performing the following activities: 09/27/2018  Hearing? N  Vision? N  Difficulty concentrating or making decisions? N  Walking or climbing stairs? Y  Dressing or bathing? N  Doing errands, shopping? N  Preparing Food and eating ? N  Using the Toilet? N  In the past six months, have you accidently leaked urine? N  Do you have problems with loss of bowel control? N  Managing your Medications? N  Managing your Finances? N  Housekeeping or managing your Housekeeping? N  Some recent data might be hidden    Patient Care Team: [name], MD as PCP - General    Assessment:   This is a routine wellness examination for [name].   Hearing  Screening   125Hz  250Hz  500Hz  1000Hz  2000Hz  3000Hz  4000Hz  6000Hz  8000Hz   Right ear:           Left ear:           Vision Screening Comments: Vision exam in Jan 2020 with Dr. Marylene Land   Exercise Activities and Dietary recommendations Current Exercise Habits: The patient does not participate in regular exercise at present, Exercise limited by: None identified  Goals    . Patient  Stated     Starting 09/27/2018, I will continue to take medications as prescribed.        Fall Risk Fall Risk  09/27/2018 09/21/2017 08/17/2017 06/17/2017 06/17/2017  Falls in the past year? 0 No No No No    Depression Screen PHQ 2/9 Scores 09/27/2018 09/21/2017 08/17/2017 06/17/2017  PHQ - 2 Score 0 0 0 0  PHQ- 9 Score 0 - - -     Cognitive Function MMSE - Mini Mental State Exam 09/27/2018  Orientation to time 5  Orientation to Place 5  Registration 3  Attention/ Calculation 0  Recall 3  Language- name 2 objects 0  Language- repeat 1  Language- follow 3 step command 0  Language- read & follow direction 0  Write a sentence 0  Copy design 0  Total score 17     PLEASE NOTE: A Mini-Cog screen was completed. Maximum score is 17. A value of 0 denotes this part of Folstein MMSE was not completed or the patient failed this part of the Mini-Cog screening.   Mini-Cog Screening Orientation to Time - Max 5 pts Orientation to Place - Max 5 pts Registration - Max 3 pts Recall - Max 3 pts Language Repeat - Max 1 pts      Immunization History  Administered Date(s) Administered  . Influenza Split 01/15/2011, 01/19/2012  . Influenza Whole 01/11/2009, 12/24/2009  . Influenza,inj,Quad PF,6+ Mos 01/27/2013, 02/11/2017, 02/24/2018  . Influenza-Unspecified 11/28/2013  . Pneumococcal Conjugate-13 07/31/2014  . Pneumococcal Polysaccharide-23 09/03/2009  . Td 04/13/1997, 09/03/2009  . Zoster 11/29/2013    Screening Tests Health Maintenance  Topic Date Due  . HEMOGLOBIN A1C  09/28/2018 (Originally 08/18/2018)  .  MAMMOGRAM  04/13/2019 (Originally 05/26/2018)  . INFLUENZA VACCINE  11/12/2018  . OPHTHALMOLOGY EXAM  01/23/2019  . FOOT EXAM  02/25/2019  . TETANUS/TDAP  09/04/2019  . COLONOSCOPY  01/20/2022  . DEXA SCAN  Completed  . Hepatitis C Screening  Completed  . PNA vac Low Risk Adult  Completed      Plan:     I have personally reviewed, addressed, and noted the following in the patient's chart:  A. Medical and social history B. Use of alcohol, tobacco or illicit drugs  C. Current medications and supplements D. Functional ability and status E.  Nutritional status F.  Physical activity G. Advance directives H. List of other physicians I.  Hospitalizations, surgeries, and ER visits in previous 12 months J.  Vitals (unless it is a telemedicine encounter) K. Screenings to include cognitive, depression, hearing, vision (NOTE: hearing and vision screenings not completed in telemedicine encounter) L. Referrals and appointments   In addition, I have reviewed and discussed with patient certain preventive protocols, quality metrics, and best practice recommendations. A written personalized care plan for preventive services and recommendations were provided to patient.  With patient's permission, we connected on 09/27/18 at 11:30 AM EDT. Interactive audio and video telecommunications were attempted with patient. This attempt was unsuccessful due to patient having technical difficulties OR patient did not have access to video capability.  Encounter was completed with audio only.  Two patient identifiers were used to ensure the encounter occurred with the correct person. Patient was in home and writer was in office.    Signed,   Lindell Noe, MHA, BS, RN Health Coach

## 2018-09-28 ENCOUNTER — Encounter: Payer: Self-pay | Admitting: Internal Medicine

## 2018-09-28 ENCOUNTER — Other Ambulatory Visit: Payer: Self-pay

## 2018-09-28 ENCOUNTER — Other Ambulatory Visit (INDEPENDENT_AMBULATORY_CARE_PROVIDER_SITE_OTHER): Payer: PPO

## 2018-09-28 ENCOUNTER — Telehealth: Payer: Self-pay | Admitting: Internal Medicine

## 2018-09-28 ENCOUNTER — Inpatient Hospital Stay (HOSPITAL_BASED_OUTPATIENT_CLINIC_OR_DEPARTMENT_OTHER): Payer: PPO | Admitting: Internal Medicine

## 2018-09-28 VITALS — BP 109/63 | HR 108 | Temp 98.5°F | Resp 18 | Ht 59.0 in | Wt 132.9 lb

## 2018-09-28 DIAGNOSIS — Z7189 Other specified counseling: Secondary | ICD-10-CM

## 2018-09-28 DIAGNOSIS — E042 Nontoxic multinodular goiter: Secondary | ICD-10-CM

## 2018-09-28 DIAGNOSIS — Z9221 Personal history of antineoplastic chemotherapy: Secondary | ICD-10-CM | POA: Diagnosis not present

## 2018-09-28 DIAGNOSIS — E119 Type 2 diabetes mellitus without complications: Secondary | ICD-10-CM

## 2018-09-28 DIAGNOSIS — Z79899 Other long term (current) drug therapy: Secondary | ICD-10-CM

## 2018-09-28 DIAGNOSIS — R0602 Shortness of breath: Secondary | ICD-10-CM | POA: Diagnosis not present

## 2018-09-28 DIAGNOSIS — C3431 Malignant neoplasm of lower lobe, right bronchus or lung: Secondary | ICD-10-CM | POA: Diagnosis not present

## 2018-09-28 DIAGNOSIS — F17210 Nicotine dependence, cigarettes, uncomplicated: Secondary | ICD-10-CM

## 2018-09-28 DIAGNOSIS — I700 Atherosclerosis of aorta: Secondary | ICD-10-CM | POA: Diagnosis not present

## 2018-09-28 DIAGNOSIS — Z923 Personal history of irradiation: Secondary | ICD-10-CM

## 2018-09-28 DIAGNOSIS — I2510 Atherosclerotic heart disease of native coronary artery without angina pectoris: Secondary | ICD-10-CM

## 2018-09-28 DIAGNOSIS — N3000 Acute cystitis without hematuria: Secondary | ICD-10-CM

## 2018-09-28 DIAGNOSIS — K449 Diaphragmatic hernia without obstruction or gangrene: Secondary | ICD-10-CM

## 2018-09-28 LAB — LIPID PANEL
Cholesterol: 174 mg/dL (ref 0–200)
HDL: 66.3 mg/dL (ref >39.00)
LDL Cholesterol: 90 mg/dL (ref 0–99)
NonHDL: 107.71
Total CHOL/HDL Ratio: 3
Triglycerides: 91 mg/dL (ref 0.0–149.0)
VLDL: 18.2 mg/dL (ref 0.0–40.0)

## 2018-09-28 LAB — HEMOGLOBIN A1C: Hgb A1c MFr Bld: 5.7 % (ref 4.6–6.5)

## 2018-09-28 NOTE — Telephone Encounter (Signed)
Scheduled per los. Gave avs and calendar  

## 2018-09-28 NOTE — Progress Notes (Signed)
[facility] [phone]   [phone]  OFFICE PROGRESS NOTE  [name], MD [place] [address]  DIAGNOSIS: Stage IIIA (T2a,N2, M0) non-small cell lung cancer,adenocarcinomapresenting with right lower lobe lung mass in addition to subcarinal lymphadenopathy and suspicious metastatic pulmonary nodules diagnosed in February 2019.  PRIOR THERAPY: 1) Concurrent chemoradiation with carboplatin for an AUC of 2 with paclitaxel 45 mg/m weekly. First dose given on 06/28/2017.  Status post 7 cycles.  Last dose was giving August 09, 2017 with partial response. 2) Consolidation immunotherapy with Imfinzi (Durvalumab) 10 mg/KG every 2 weeks, first dose 09/23/2017.  Status post 26 cycles. Last dose was given on Sep 08, 2018.  CURRENT THERAPY: Observation.  INTERVAL HISTORY: Katelyn Kennedy 73 y.o. female returns to the clinic today for a follow-up visit.  The patient is feeling fine today with no concerning complaints except for she recently was seen by her primary care provider for UTI-like symptoms; however, her culture was negative for any bacteria. Otherwise, she is feeling well today. She recently completed her last dose of immunotherapy with Imfinzi and tolerated it well without any adverse effects. She denied having any fever or chills.  She has no nausea, vomiting, diarrhea or constipation.  She denied having any chest pain, shortness breath, cough or hemoptysis.  She denied having any headache or visual changes. She recently had a repeat CT scan of the chest performed. She is here for evaluation and to review her scan results.   MEDICAL HISTORY: Past Medical History:  Diagnosis Date  . Allergic rhinitis   . Arthritis   . Diabetes mellitus without complication (HCC)    diet control/no meds per pt  . Dyspnea   . History of chicken pox   . lung ca dx'd 04/2017  . Smoker     ALLERGIES:  is allergic to codeine.   MEDICATIONS:  Current Outpatient Medications  Medication Sig Dispense Refill  . acetaminophen (TYLENOL) 500 MG tablet Take 500 mg by mouth daily as needed for moderate pain or headache.    Marland Kitchen atorvastatin (LIPITOR) 20 MG tablet Take 1 tablet (20 mg total) by mouth daily. 90 tablet 3  . lisinopril (PRINIVIL,ZESTRIL) 5 MG tablet TAKE 1/2 TABLET BY MOUTH ONCE DAILY 45 tablet 1  . Multiple Vitamin (MULTIVITAMIN) capsule Take 1 capsule by mouth daily.      Vladimir Faster Glycol-Propyl Glycol (SYSTANE) 0.4-0.3 % SOLN Apply to eye.    . traZODone (DESYREL) 50 MG tablet TAKE 1/2 TO 1 TABLET(25 TO 50 MG) BY MOUTH AT BEDTIME AS NEEDED FOR SLEEP 90 tablet 0   No current facility-administered medications for this visit.     SURGICAL HISTORY:  Past Surgical History:  Procedure Laterality Date  . ANKLE FRACTURE SURGERY Left 10 years ago   "hard to wake up from anesthesia" per pt  . [place]  . BREAST BIOPSY Right 1978   BENIGN CYST  . BREAST EXCISIONAL BIOPSY Left 2011   Benign  . BREAST EXCISIONAL BIOPSY Right 1985   Benign   . CATARACT EXTRACTION    . CATARACT EXTRACTION W/ INTRAOCULAR LENS IMPLANT Bilateral   . COLONOSCOPY  2011  . ELECTROMAGNETIC NAVIGATION BROCHOSCOPY N/A 06/08/2017   Procedure: ELECTROMAGNETIC NAVIGATION BRONCHOSCOPY;  Surgeon: Flora Lipps, MD;  Location: [facility] ORS;  Service: Cardiopulmonary;  Laterality: N/A;  . POLYPECTOMY  2011  . TRANSURETHRAL RESECTION OF BLADDER TUMOR WITH MITOMYCIN-C N/A 05/04/2017  Procedure: TRANSURETHRAL RESECTION OF BLADDER TUMOR WITH gemcitabine;  Surgeon: Abbie Sons, MD;  Location: [facility] ORS;  Service: Urology;  Laterality: N/A;  . TUBAL LIGATION      REVIEW OF SYSTEMS:  Constitutional: negative Eyes: negative Ears, nose, mouth, throat, and face: negative Respiratory: positive for dyspnea on exertion Cardiovascular: negative Gastrointestinal: negative Genitourinary:negative Integument/breast: negative  Hematologic/lymphatic: negative Musculoskeletal:negative Neurological: negative Behavioral/Psych: negative Endocrine: negative Allergic/Immunologic: negative   PHYSICAL EXAMINATION: General appearance: alert, cooperative and no distress Head: Normocephalic, without obvious abnormality, atraumatic Neck: no adenopathy, no JVD, supple, symmetrical, trachea midline and thyroid not enlarged, symmetric, no tenderness/mass/nodules Lymph nodes: Cervical, supraclavicular, and axillary nodes normal. Resp: clear to auscultation bilaterally Back: symmetric, no curvature. ROM normal. No CVA tenderness. Cardio: regular rate and rhythm, S1, S2 normal, no murmur, click, rub or gallop GI: soft, non-tender; bowel sounds normal; no masses,  no organomegaly Extremities: extremities normal, atraumatic, no cyanosis or edema Neurologic: Alert and oriented X 3, normal strength and tone. Normal symmetric reflexes. Normal coordination and gait  ECOG PERFORMANCE STATUS: 0 - Asymptomatic  Blood pressure 109/63, pulse (!) 108, temperature 98.5 F (36.9 C), temperature source Oral, resp. rate 18, height 4\' 11"  (1.499 m), weight 132 lb 14.4 oz (60.3 kg), SpO2 95 %.  LABORATORY DATA: Lab Results  Component Value Date   WBC 5.8 09/27/2018   HGB 13.5 09/27/2018   HCT 40.8 09/27/2018   MCV 95.3 09/27/2018   PLT 282 09/27/2018      Chemistry      Component Value Date/Time   NA 141 09/27/2018 1226   K 4.5 09/27/2018 1226   CL 106 09/27/2018 1226   CO2 22 09/27/2018 1226   BUN 15 09/27/2018 1226   CREATININE 0.86 09/27/2018 1226      Component Value Date/Time   CALCIUM 9.9 09/27/2018 1226   ALKPHOS 104 09/27/2018 1226   AST 23 09/27/2018 1226   ALT 21 09/27/2018 1226   BILITOT 0.3 09/27/2018 1226       RADIOGRAPHIC STUDIES: Ct Chest W Contrast  Result Date: 09/28/2018 CLINICAL DATA:  Stage IIIA right lower lobe lung adenocarcinoma diagnosed February 2019 with chemotherapy completed April 2019.  Immunotherapy just finished. EXAM: CT CHEST WITH CONTRAST TECHNIQUE: Multidetector CT imaging of the chest was performed during intravenous contrast administration. CONTRAST:  12mL OMNIPAQUE IOHEXOL 300 MG/ML  SOLN COMPARISON:  05/31/2018 and PET-CT 05/26/2017 FINDINGS: Cardiovascular: Heart is normal size. Calcified plaque over the left main and 3 vessel coronary arteries. Calcified plaque over the thoracic aorta. Remaining vascular structures are unremarkable. Mediastinum/Nodes: No significant mediastinal or hilar adenopathy. Several stable low-density thyroid nodules with the largest over the left lobe measuring 1.5 cm. No axillary adenopathy. Small hiatal hernia. Lungs/Pleura: Lungs are adequately inflated demonstrate continued stable right medial/paramediastinal radiation fibrosis. Stable 7 mm nodule over the right lower lobe. Stable sub solid 1.1 cm nodule over the right apex. Two other tiny stable subcentimeter right lung nodule opacities over the upper lobe. No left lung nodules. No effusion. Airways are unremarkable. Upper Abdomen: 2 subcentimeter liver hypodensities unchanged and likely a cysts. 1.4 cm hypodensity over the lateral segment of the left lobe is not significantly changed. Atherosclerotic plaque over the abdominal aorta. Musculoskeletal: Mild degenerative change of the spine. IMPRESSION: 1. Stable right paramediastinal post radiation fibrosis. Stable 7 mm nodule right lower lobe. Stable sub solid 1.1 cm nodule over the right apex. Two tiny subcentimeter stable right lung nodular opacities. No new nodules/masses and no mediastinal or hilar  adenopathy. 2. Stable indeterminate 1.4 cm hypodensity over the lateral segment left lobe of the liver. Two stable subcentimeter liver hypodensities likely cysts. 3. Stable thyroid nodules with the largest measuring 1.5 cm over the left lobe. 4.  Small hiatal hernia. 5. Aortic Atherosclerosis (ICD10-I70.0). Atherosclerotic coronary artery disease.  Electronically Signed   By: Marin Olp M.D.   On: 09/28/2018 09:23    ASSESSMENT AND PLAN: This is a very pleasant 73 year old white female with suspicious for stage IIIA non-small cell lung cancer, adenocarcinoma presented mainly with right lower lobe lung mass in addition to subcarinal lymphadenopathy but there was suspicious metastatic pulmonary nodules. The patient completed a course of concurrent chemoradiation with weekly carboplatin and paclitaxel.  She tolerated her treatment well with no concerning complaints. The patient recently completed consolidation treatment with immunotherapy with Imfinzi (Durvalumab). She is status post 26 cycles. She tolerated it well without any adverse effects.  The patient recently had a restaging CT scan of the chest performed. I personally and independently reviewed the scan and discussed the results with the patient today. The scan did not show any evidence of disease progression. I recommend that the patient continue on observation.  I will arrange for the patient to have a restaging CT scan of the chest in 3 months.  I will see her back for a follow up visit in 3 months for evaluation and to review her scan results.  For her UTI like symptoms, I recommend that the patient try Pyridium.  She was advised to call immediately if she has any concerning symptoms in the interval. All questions were answered. The patient knows to call the clinic with any problems, questions or concerns. We can certainly see the patient much sooner if necessary.  Disclaimer: This note was dictated with voice recognition software. Similar sounding words can inadvertently be transcribed and may not be corrected upon review.

## 2018-09-30 ENCOUNTER — Ambulatory Visit (INDEPENDENT_AMBULATORY_CARE_PROVIDER_SITE_OTHER): Payer: PPO | Admitting: Family Medicine

## 2018-09-30 ENCOUNTER — Encounter: Payer: Self-pay | Admitting: Family Medicine

## 2018-09-30 VITALS — BP 109/63 | HR 108 | Temp 98.5°F | Wt 132.0 lb

## 2018-09-30 DIAGNOSIS — Z8551 Personal history of malignant neoplasm of bladder: Secondary | ICD-10-CM | POA: Insufficient documentation

## 2018-09-30 DIAGNOSIS — E7800 Pure hypercholesterolemia, unspecified: Secondary | ICD-10-CM

## 2018-09-30 DIAGNOSIS — D494 Neoplasm of unspecified behavior of bladder: Secondary | ICD-10-CM | POA: Diagnosis not present

## 2018-09-30 DIAGNOSIS — Z0000 Encounter for general adult medical examination without abnormal findings: Secondary | ICD-10-CM | POA: Diagnosis not present

## 2018-09-30 DIAGNOSIS — Z1231 Encounter for screening mammogram for malignant neoplasm of breast: Secondary | ICD-10-CM | POA: Diagnosis not present

## 2018-09-30 DIAGNOSIS — C3431 Malignant neoplasm of lower lobe, right bronchus or lung: Secondary | ICD-10-CM

## 2018-09-30 DIAGNOSIS — E119 Type 2 diabetes mellitus without complications: Secondary | ICD-10-CM | POA: Diagnosis not present

## 2018-09-30 DIAGNOSIS — M810 Age-related osteoporosis without current pathological fracture: Secondary | ICD-10-CM

## 2018-09-30 NOTE — Assessment & Plan Note (Addendum)
S/P chemo, surgery, immunotherapy.. followed by Adella Nissen

## 2018-09-30 NOTE — Assessment & Plan Note (Signed)
Diet controlled. Encouraged exercise, weight loss, healthy eating habits.

## 2018-09-30 NOTE — Assessment & Plan Note (Signed)
Due for re-peat bone density

## 2018-09-30 NOTE — Assessment & Plan Note (Signed)
Well controlled. Continue current medication.  

## 2018-09-30 NOTE — Progress Notes (Signed)
VIRTUAL VISIT Due to national recommendations of social distancing due to [place] 19, a virtual visit is felt to be most appropriate for this patient at this time.   I connected with the patient on 09/30/18 at 11:40 AM EDT by virtual telehealth platform and verified that I am speaking with the correct person using two identifiers.   I discussed the limitations, risks, security and privacy concerns of performing an evaluation and management service by  virtual telehealth platform and the availability of in person appointments. I also discussed with the patient that there may be a patient responsible charge related to this service. The patient expressed understanding and agreed to proceed.  Patient location: Home Provider Location: [facility] Participants: Eliezer Lofts and [name]   Chief Complaint  Patient presents with  . Annual Exam    Part 2    History of Present Illness: The patient presents forcomplete physical and review of chronic health problems. He/She also has the following acute concerns today:  The patient saw Candis Musa, LPN for medicare wellness. Note reviewed in detail and important notes copied below.  Health maintenance: A1C - ordered Mammogram - to be scheduled Abnormal screenings:  None  09/30/18  Diabetes: Diet controlled Lab Results  Component Value Date   HGBA1C 5.7 09/28/2018  Using medications without difficulties: Hypoglycemic episodes: Hyperglycemic episodes: Feet problems: no ulcer Blood Sugars averaging: not checking eye exam within last year: yes  Elevated Cholesterol:LDL at goal < 100 on atorvastatin Lab Results  Component Value Date   CHOL 174 09/28/2018   HDL 66.30 09/28/2018   LDLCALC 90 09/28/2018   LDLDIRECT 118.7 01/24/2013   TRIG 91.0 09/28/2018   CHOLHDL 3 09/28/2018  Using medications without problems: Muscle aches: none Diet compliance:moderate Exercise: minimal Other complaints:   Chronic  insomnia: On trazodone   Hypertension:  Good control on lisinopril BP Readings from Last 3 Encounters:  09/30/18 109/63  09/28/18 109/63  09/20/18 92/60  Using medication without problems or lightheadedness: none Chest pain with exertion:none Edema:none Short of breath:none Average home BPs: Other issues:   Last OV with ONC:  DrJulien Nordmann 09/2018 reviewed. DIAGNOSIS: Stage IIIA (T2a,N2, M0) non-small cell lung cancer,adenocarcinomapresenting with right lower lobe lung mass in addition to subcarinal lymphadenopathy and suspicious metastatic pulmonary nodules diagnosed in February 2019. The patient completed a course of concurrent chemoradiation with weekly carboplatin and paclitaxel.  She tolerated her treatment well with no concerning complaints. The patient recently completed consolidation treatment with immunotherapy with Imfinzi (Durvalumab). She is status post 26 cycles. She tolerated it well without any adverse effects.   Currently following with CT scans.   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 She received intravesical gemcitabine post resection.   COVID 19 screen No recent travel or known exposure to COVID19 The patient denies respiratory symptoms of COVID 19 at this time.  The importance of social distancing was discussed today.   Review of Systems  Constitutional: Negative for chills and fever.  HENT: Negative for congestion and ear pain.   Eyes: Negative for pain and redness.  Respiratory: Negative for cough and shortness of breath.   Cardiovascular: Negative for chest pain, palpitations and leg swelling.  Gastrointestinal: Negative for abdominal pain, blood in stool, constipation, diarrhea, nausea and vomiting.  Genitourinary: Negative for dysuria.  Musculoskeletal: Negative for falls and myalgias.  Skin: Negative for rash.  Neurological: Negative for dizziness.  Psychiatric/Behavioral: Negative for  depression. The patient  is not nervous/anxious.       Past Medical History:  Diagnosis Date  . Allergic rhinitis   . Arthritis   . Diabetes mellitus without complication (HCC)    diet control/no meds per pt  . Dyspnea   . History of chicken pox   . lung ca dx'd 04/2017  . Smoker     reports that she quit smoking about 17 months ago. Her smoking use included cigarettes. She has a 20.00 pack-year smoking history. She has never used smokeless tobacco. She reports current alcohol use. She reports that she does not use drugs.   Current Outpatient Medications:  .  acetaminophen (TYLENOL) 500 MG tablet, Take 500 mg by mouth daily as needed for moderate pain or headache., Disp: , Rfl:  .  atorvastatin (LIPITOR) 20 MG tablet, Take 1 tablet (20 mg total) by mouth daily., Disp: 90 tablet, Rfl: 3 .  lisinopril (PRINIVIL,ZESTRIL) 5 MG tablet, TAKE 1/2 TABLET BY MOUTH ONCE DAILY, Disp: 45 tablet, Rfl: 1 .  Multiple Vitamin (MULTIVITAMIN) capsule, Take 1 capsule by mouth daily.  , Disp: , Rfl:  .  Polyethyl Glycol-Propyl Glycol (SYSTANE) 0.4-0.3 % SOLN, Apply to eye., Disp: , Rfl:  .  traZODone (DESYREL) 50 MG tablet, TAKE 1/2 TO 1 TABLET(25 TO 50 MG) BY MOUTH AT BEDTIME AS NEEDED FOR SLEEP, Disp: 90 tablet, Rfl: 0   Observations/Objective: Blood pressure 109/63, pulse (!) 108, temperature 98.5 F (36.9 C), temperature source Oral, weight 132 lb (59.9 kg), SpO2 95 %.  Physical Exam  Physical Exam Constitutional:      General: The patient is not in acute distress. Pulmonary:     Effort: Pulmonary effort is normal. No respiratory distress.  Neurological:     Mental Status: The patient is alert and oriented to person, place, and time.  Psychiatric:        Mood and Affect: Mood normal.        Behavior: Behavior normal.   Assessment and Plan The patient's preventative maintenance and recommended screening tests for an annual wellness exam were reviewed in full today. Brought up to date unless  services declined.  Counselled on the importance of diet, exercise, and its role in overall health and mortality. The patient's FH and SH was reviewed, including their home life, tobacco status, and drug and alcohol status.    Vaccines: Td uptodate, PNA 13,23 up to date Pap/DVE: not indicated Mammo: 05/2016 due Bone Density: 05/2016 osteoporosis.Marland Kitchen due  Colon: 2018 repeat in 5 years Smoking Status: QUIT!  ETOH/ drug HWE:XHBZ/JIRC  Hep C: done  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 (HCC)  S/P chemo, surgery, immunotherapy.. followed by Shelah Lewandowsky Well controlled. Continue current medication.   Osteoporosis Due for re-peat bone density  History of primary bladder cancer Followed by [facility] Dr. Bernardo Heater.  Diabetes mellitus with no complication Diet controlled. Encouraged exercise, weight loss, healthy eating habits.      I discussed the assessment and treatment plan with the patient. The patient was provided an opportunity to ask questions and all were answered. The patient agreed with the plan and demonstrated an understanding of the instructions.   The patient was advised to call back or seek an in-person evaluation if the symptoms worsen or if the condition fails to improve as anticipated.     Eliezer Lofts, MD

## 2018-09-30 NOTE — Assessment & Plan Note (Signed)
Followed by [facility] Dr. Bernardo Heater.

## 2018-10-04 NOTE — Progress Notes (Signed)
Labs 12/17 Follow up 12/22 Pt aware

## 2018-10-10 NOTE — Progress Notes (Signed)
I reviewed health advisor's note, was available for consultation, and agree with documentation and plan.  

## 2018-10-18 ENCOUNTER — Other Ambulatory Visit: Payer: Self-pay | Admitting: Family Medicine

## 2018-10-26 ENCOUNTER — Telehealth: Payer: Self-pay

## 2018-10-26 NOTE — Telephone Encounter (Signed)
Okay to make change as requested given back order.

## 2018-10-26 NOTE — Telephone Encounter (Signed)
Stephanie at [facility] left v/m that lisinopril 5 mg is on BO and not sure when will get med in pharmacy. Colletta [place] wants to know if lisinopril 2.5 mg taking 1 tab daily could be called in for pt.Please advise.

## 2018-10-27 MED ORDER — LISINOPRIL 2.5 MG PO TABS: 2.5000 mg | ORAL_TABLET | Freq: Every day | ORAL | 3 refills | Status: DC

## 2018-10-27 NOTE — Telephone Encounter (Signed)
Changed rx to lisinopril 2.5mg  and sent it to the pharmacy

## 2018-12-08 ENCOUNTER — Ambulatory Visit: Payer: PPO

## 2018-12-08 ENCOUNTER — Other Ambulatory Visit: Payer: PPO

## 2018-12-23 ENCOUNTER — Other Ambulatory Visit: Payer: Self-pay | Admitting: Physician Assistant

## 2018-12-23 DIAGNOSIS — C3431 Malignant neoplasm of lower lobe, right bronchus or lung: Secondary | ICD-10-CM

## 2018-12-26 ENCOUNTER — Encounter (HOSPITAL_COMMUNITY): Payer: Self-pay

## 2018-12-26 ENCOUNTER — Ambulatory Visit (HOSPITAL_COMMUNITY)
Admission: RE | Admit: 2018-12-26 | Discharge: 2018-12-26 | Disposition: A | Discharge status: Home / Self Care | Payer: PPO | Source: Ambulatory Visit | Attending: Physician Assistant | Admitting: Physician Assistant

## 2018-12-26 ENCOUNTER — Other Ambulatory Visit: Payer: Self-pay

## 2018-12-26 ENCOUNTER — Inpatient Hospital Stay: Payer: PPO | Attending: Internal Medicine

## 2018-12-26 DIAGNOSIS — E119 Type 2 diabetes mellitus without complications: Secondary | ICD-10-CM | POA: Diagnosis not present

## 2018-12-26 DIAGNOSIS — E041 Nontoxic single thyroid nodule: Secondary | ICD-10-CM | POA: Diagnosis not present

## 2018-12-26 DIAGNOSIS — Z87891 Personal history of nicotine dependence: Secondary | ICD-10-CM | POA: Diagnosis not present

## 2018-12-26 DIAGNOSIS — M129 Arthropathy, unspecified: Secondary | ICD-10-CM | POA: Insufficient documentation

## 2018-12-26 DIAGNOSIS — C3431 Malignant neoplasm of lower lobe, right bronchus or lung: Secondary | ICD-10-CM | POA: Insufficient documentation

## 2018-12-26 DIAGNOSIS — C778 Secondary and unspecified malignant neoplasm of lymph nodes of multiple regions: Secondary | ICD-10-CM | POA: Diagnosis not present

## 2018-12-26 DIAGNOSIS — Z79899 Other long term (current) drug therapy: Secondary | ICD-10-CM | POA: Insufficient documentation

## 2018-12-26 DIAGNOSIS — K760 Fatty (change of) liver, not elsewhere classified: Secondary | ICD-10-CM | POA: Diagnosis not present

## 2018-12-26 LAB — CBC WITH DIFFERENTIAL (CANCER CENTER ONLY)
Abs Immature Granulocytes: 0.04 10*3/uL (ref 0.00–0.07)
Basophils Absolute: 0 10*3/uL (ref 0.0–0.1)
Basophils Relative: 0 %
Eosinophils Absolute: 0.1 10*3/uL (ref 0.0–0.5)
Eosinophils Relative: 1 %
HCT: 40.1 % (ref 36.0–46.0)
Hemoglobin: 13.5 g/dL (ref 12.0–15.0)
Immature Granulocytes: 1 %
Lymphocytes Relative: 16 %
Lymphs Abs: 1.2 10*3/uL (ref 0.7–4.0)
MCH: 31.7 pg (ref 26.0–34.0)
MCHC: 33.7 g/dL (ref 30.0–36.0)
MCV: 94.1 fL (ref 80.0–100.0)
Monocytes Absolute: 0.6 10*3/uL (ref 0.1–1.0)
Monocytes Relative: 8 %
Neutro Abs: 5.6 10*3/uL (ref 1.7–7.7)
Neutrophils Relative %: 74 %
Platelet Count: 198 10*3/uL (ref 150–400)
RBC: 4.26 MIL/uL (ref 3.87–5.11)
RDW: 12.7 % (ref 11.5–15.5)
WBC Count: 7.4 10*3/uL (ref 4.0–10.5)
nRBC: 0 % (ref 0.0–0.2)

## 2018-12-26 LAB — CMP (CANCER CENTER ONLY)
ALT: 20 U/L (ref 0–44)
AST: 23 U/L (ref 15–41)
Albumin: 4.1 g/dL (ref 3.5–5.0)
Alkaline Phosphatase: 97 U/L (ref 38–126)
Anion gap: 9 (ref 5–15)
BUN: 10 mg/dL (ref 8–23)
CO2: 24 mmol/L (ref 22–32)
Calcium: 9.7 mg/dL (ref 8.9–10.3)
Chloride: 108 mmol/L (ref 98–111)
Creatinine: 0.86 mg/dL (ref 0.44–1.00)
GFR, Est AFR Am: >60 mL/min (ref >60)
GFR, Estimated: >60 mL/min (ref >60)
Glucose, Bld: 114 mg/dL — ABNORMAL HIGH (ref 70–99)
Potassium: 4.3 mmol/L (ref 3.5–5.1)
Sodium: 141 mmol/L (ref 135–145)
Total Bilirubin: 0.4 mg/dL (ref 0.3–1.2)
Total Protein: 6.8 g/dL (ref 6.5–8.1)

## 2018-12-26 MED ORDER — IOHEXOL 300 MG/ML  SOLN
75.0000 mL | Freq: Once | INTRAMUSCULAR | Status: AC | PRN
  Administered 2018-12-26: 75 mL via INTRAVENOUS

## 2018-12-26 MED ORDER — SODIUM CHLORIDE (PF) 0.9 % IJ SOLN
INTRAMUSCULAR | Status: AC
  Filled 2018-12-26: qty 50

## 2018-12-27 ENCOUNTER — Ambulatory Visit
Admission: RE | Admit: 2018-12-27 | Discharge: 2018-12-27 | Disposition: A | Discharge status: Home / Self Care | Payer: PPO | Source: Ambulatory Visit | Attending: Family Medicine | Admitting: Family Medicine

## 2018-12-27 ENCOUNTER — Other Ambulatory Visit: Payer: PPO

## 2018-12-27 DIAGNOSIS — Z1231 Encounter for screening mammogram for malignant neoplasm of breast: Secondary | ICD-10-CM | POA: Diagnosis not present

## 2018-12-29 ENCOUNTER — Encounter: Payer: Self-pay | Admitting: Internal Medicine

## 2018-12-29 ENCOUNTER — Telehealth: Payer: Self-pay | Admitting: Internal Medicine

## 2018-12-29 ENCOUNTER — Inpatient Hospital Stay (HOSPITAL_BASED_OUTPATIENT_CLINIC_OR_DEPARTMENT_OTHER): Payer: PPO | Admitting: Internal Medicine

## 2018-12-29 ENCOUNTER — Other Ambulatory Visit: Payer: Self-pay

## 2018-12-29 VITALS — BP 129/72 | HR 105 | Temp 97.8°F | Resp 16 | Ht 59.0 in | Wt 134.6 lb

## 2018-12-29 DIAGNOSIS — C3431 Malignant neoplasm of lower lobe, right bronchus or lung: Secondary | ICD-10-CM | POA: Diagnosis not present

## 2018-12-29 DIAGNOSIS — C3490 Malignant neoplasm of unspecified part of unspecified bronchus or lung: Secondary | ICD-10-CM

## 2018-12-29 NOTE — Telephone Encounter (Signed)
Scheduled appt per 9/17 los - mailed letter with appt date and time

## 2018-12-29 NOTE — Progress Notes (Signed)
[place] [phone]   [phone]  OFFICE PROGRESS NOTE  Katelyn Sanders, MD [place] [address]  DIAGNOSIS: Stage IIIA (T2a,N2, M0) non-small cell lung cancer,adenocarcinomapresenting with right lower lobe lung mass in addition to subcarinal lymphadenopathy and suspicious metastatic pulmonary nodules diagnosed in February 2019.  PRIOR THERAPY: 1) Concurrent chemoradiation with carboplatin for an AUC of 2 with paclitaxel 45 mg/m weekly. First dose given on 06/28/2017.  Status post 7 cycles.  Last dose was giving August 09, 2017 with partial response. 2) Consolidation immunotherapy with Imfinzi (Durvalumab) 10 mg/KG every 2 weeks, first dose 09/23/2017.  Status post 26 cycles. Last dose was given on Sep 08, 2018.  CURRENT THERAPY: Observation.  INTERVAL HISTORY: Katelyn Kennedy 73 y.o. female returns to the clinic today for follow-up visit.  The patient is feeling fine today with no concerning complaints.  She denied having any current chest pain, shortness of breath, cough or hemoptysis.  She denied having any fever or chills.  She has no nausea, vomiting, diarrhea or constipation.  She has no headache or visual changes.  The patient had repeat CT scan of the chest performed recently and she is here for evaluation and discussion of her scan results.   MEDICAL HISTORY: Past Medical History:  Diagnosis Date   Allergic rhinitis    Arthritis    Diabetes mellitus without complication (HCC)    diet control/no meds per pt   Dyspnea    History of chicken pox    lung ca dx'd 04/2017   Smoker     ALLERGIES:  is allergic to codeine.  MEDICATIONS:  Current Outpatient Medications  Medication Sig Dispense Refill   acetaminophen (TYLENOL) 500 MG tablet Take 500 mg by mouth daily as needed for moderate pain or headache.     atorvastatin (LIPITOR) 20 MG tablet Take 1 tablet (20 mg total) by mouth daily. 90 tablet 3     lisinopril (ZESTRIL) 2.5 MG tablet Take 1 tablet (2.5 mg total) by mouth daily. 90 tablet 3   Multiple Vitamin (MULTIVITAMIN) capsule Take 1 capsule by mouth daily.       Polyethyl Glycol-Propyl Glycol (SYSTANE) 0.4-0.3 % SOLN Apply to eye.     traZODone (DESYREL) 50 MG tablet TAKE 1/2 TO 1 TABLET(25 TO 50 MG) BY MOUTH AT BEDTIME AS NEEDED FOR SLEEP 90 tablet 1   No current facility-administered medications for this visit.     SURGICAL HISTORY:  Past Surgical History:  Procedure Laterality Date   ANKLE FRACTURE SURGERY Left 10 years ago   "hard to wake up from anesthesia" per pt   [place] Right 1978   BENIGN CYST   BREAST EXCISIONAL BIOPSY Left 2011   Benign   BREAST EXCISIONAL BIOPSY Right 1985   Benign    CATARACT EXTRACTION     CATARACT EXTRACTION W/ INTRAOCULAR LENS IMPLANT Bilateral    COLONOSCOPY  2011   ELECTROMAGNETIC NAVIGATION BROCHOSCOPY N/A 06/08/2017   Procedure: ELECTROMAGNETIC NAVIGATION BRONCHOSCOPY;  Surgeon: Flora Lipps, MD;  Location: [facility] OR[facility];  Service: Cardiopulmonary;  Laterality: N/A;   POLYPECTOMY  2011   TRANSURETHRAL RESECTION OF BLADDER TUMOR WITH MITOMYCIN-C N/A 05/04/2017   Procedure: TRANSURETHRAL RESECTION OF BLADDER TUMOR WITH gemcitabine;  Surgeon: Abbie Sons, MD;  Location: [facility] OR[facility];  Service: Urology;  Laterality: N/A;   TUBAL LIGATION      REVIEW OF SYSTEMS:  A  comprehensive review of systems was negative.   PHYSICAL EXAMINATION: General appearance: alert, cooperative and no distress Head: Normocephalic, without obvious abnormality, atraumatic Neck: no adenopathy, no JVD, supple, symmetrical, trachea midline and thyroid not enlarged, symmetric, no tenderness/mass/nodules Lymph nodes: Cervical, supraclavicular, and axillary nodes normal. Resp: clear to auscultation bilaterally Back: symmetric, no curvature. ROM normal. No CVA tenderness. Cardio: regular rate and rhythm, S1, S2  normal, no murmur, click, rub or gallop GI: soft, non-tender; bowel sounds normal; no masses,  no organomegaly Extremities: extremities normal, atraumatic, no cyanosis or edema  ECOG PERFORMANCE STATUS: 0 - Asymptomatic  Blood pressure 129/72, pulse (!) 105, temperature 97.8 F (36.6 C), temperature source Temporal, resp. rate 16, height 4\' 11"  (1.499 m), weight 134 lb 9.6 oz (61.1 kg), SpO2 97 %.  LABORATORY DATA: Lab Results  Component Value Date   WBC 7.4 12/26/2018   HGB 13.5 12/26/2018   HCT 40.1 12/26/2018   MCV 94.1 12/26/2018   PLT 198 12/26/2018      Chemistry      Component Value Date/Time   NA 141 12/26/2018 0910   K 4.3 12/26/2018 0910   CL 108 12/26/2018 0910   CO2 24 12/26/2018 0910   BUN 10 12/26/2018 0910   CREATININE 0.86 12/26/2018 0910      Component Value Date/Time   CALCIUM 9.7 12/26/2018 0910   ALKPHOS 97 12/26/2018 0910   AST 23 12/26/2018 0910   ALT 20 12/26/2018 0910   BILITOT 0.4 12/26/2018 0910       RADIOGRAPHIC STUDIES: Ct Chest W Contrast  Result Date: 12/26/2018 CLINICAL DATA:  73 year old female with history of primary malignant neoplasm of the right lower lobe. Restaging. EXAM: CT CHEST WITH CONTRAST TECHNIQUE: Multidetector CT imaging of the chest was performed during intravenous contrast administration. CONTRAST:  65mL OMNIPAQUE IOHEXOL 300 MG/ML  SOLN COMPARISON:  Chest CT dated 09/27/2018 FINDINGS: Cardiovascular: There is no cardiomegaly or pericardial effusion. Multi vessel coronary vascular calcification noted. There is advanced atherosclerotic calcification of the thoracic aorta. No aneurysmal dilatation or dissection. The origins of the great vessels of the aortic arch appear patent. The central pulmonary arteries are patent. Mediastinum/Nodes: No hilar or mediastinal adenopathy. Small hiatal hernia. The esophagus is grossly unremarkable. Multiple bilateral hypodense thyroid nodules measure up to 17 mm in the left thyroid lobe  similar to prior CT. Ultrasound may provide better evaluation on a nonemergent basis. No mediastinal fluid collection. Lungs/Pleura: Postradiation changes in the medial right lung with associated bronchiectasis similar to prior CT. An 8 mm nodule in the right lower lobe is similar to prior CT. Unchanged area of ground-glass density in the right apex measuring approximately 12 mm. No new nodule, consolidation, pleural effusion, or pneumothorax. The central airways are patent. A 2.2 x 1.4 cm fat attenuating lesion along the pleural surface in the left upper lobe similar to prior CT, likely a lipoma. Upper Abdomen: Apparent fatty infiltration of the liver. There is a 13 mm hypodense area in the left lobe of the liver similar to prior CT. The visualized upper abdomen is otherwise unremarkable. Musculoskeletal: No chest wall abnormality. No acute or significant osseous findings. IMPRESSION: 1. No acute intrathoracic pathology. No new disease or evidence of metastatic disease in the chest. 2. Stable post radiation changes in the medial right lung. 3. Bilateral thyroid nodules, similar to prior CT. Ultrasound may provide better evaluation on a nonemergent basis. Aortic Atherosclerosis (ICD10-I70.0). Electronically Signed   By: Anner Crete M.D.   On: 12/26/2018  12:08   Mm 3d Screen Breast Bilateral  Result Date: 12/27/2018 CLINICAL DATA:  Screening. EXAM: DIGITAL SCREENING BILATERAL MAMMOGRAM WITH TOMO AND CAD COMPARISON:  None. ACR Breast Density Category a: The breast tissue is almost entirely fatty. FINDINGS: There are no findings suspicious for malignancy. Images were processed with CAD. IMPRESSION: No mammographic evidence of malignancy. A result letter of this screening mammogram will be mailed directly to the patient. RECOMMENDATION: Screening mammogram in one year. (Code:[id number]-B-01Y) BI-RADS CATEGORY  1: Negative. Electronically Signed   By: Zerita Boers M.D.   On: 12/27/2018 16:44    ASSESSMENT AND PLAN:  This is a very pleasant 73 year old white female with suspicious for stage IIIA non-small cell lung cancer, adenocarcinoma presented mainly with right lower lobe lung mass in addition to subcarinal lymphadenopathy but there was suspicious metastatic pulmonary nodules. The patient completed a course of concurrent chemoradiation with weekly carboplatin and paclitaxel.  She tolerated her treatment well with no concerning complaints. The patient recently completed consolidation treatment with immunotherapy with Imfinzi (Durvalumab). She is status post 26 cycles. She tolerated it well without any adverse effects.  The patient is currently on observation and she is feeling fine. She had repeat CT scan of the chest performed recently.  I personally and independently reviewed the scans and discussed the results with the patient today. Her scan showed no concerning findings for disease progression. I recommended for the patient to continue on observation with repeat CT scan of the chest in 3 months. She was advised to call immediately if she has any concerning symptoms in the interval. All questions were answered. The patient knows to call the clinic with any problems, questions or concerns. We can certainly see the patient much sooner if necessary.  Disclaimer: This note was dictated with voice recognition software. Similar sounding words can inadvertently be transcribed and may not be corrected upon review.

## 2019-01-05 ENCOUNTER — Telehealth: Payer: Self-pay

## 2019-01-05 NOTE — Telephone Encounter (Signed)
Pt left v/m that having problems with allergies and has not used flonase in 1 yr. Request refill flonase [facility] [place]; pt last seen annual 12/31/18 but did not see mention of flonase. flonase 16 g x 5 last refilled 08/23/2016.Please advise.

## 2019-01-05 NOTE — Telephone Encounter (Signed)
Not on current medication list.  Okay to refill?

## 2019-01-06 MED ORDER — FLUTICASONE PROPIONATE 50 MCG/ACT NA SUSP: 2.0000 | Freq: Every day | NASAL | 6 refills | Status: DC

## 2019-01-06 NOTE — Telephone Encounter (Signed)
Refill sent in

## 2019-03-07 ENCOUNTER — Other Ambulatory Visit: Payer: PPO

## 2019-03-19 ENCOUNTER — Other Ambulatory Visit: Payer: Self-pay | Admitting: Family Medicine

## 2019-03-30 ENCOUNTER — Other Ambulatory Visit: Payer: PPO

## 2019-03-30 ENCOUNTER — Other Ambulatory Visit: Payer: Self-pay

## 2019-03-30 ENCOUNTER — Inpatient Hospital Stay: Payer: PPO | Attending: Internal Medicine

## 2019-03-30 ENCOUNTER — Telehealth: Payer: Self-pay | Admitting: Family Medicine

## 2019-03-30 DIAGNOSIS — E042 Nontoxic multinodular goiter: Secondary | ICD-10-CM | POA: Diagnosis not present

## 2019-03-30 DIAGNOSIS — M129 Arthropathy, unspecified: Secondary | ICD-10-CM | POA: Diagnosis not present

## 2019-03-30 DIAGNOSIS — E119 Type 2 diabetes mellitus without complications: Secondary | ICD-10-CM | POA: Diagnosis not present

## 2019-03-30 DIAGNOSIS — C7800 Secondary malignant neoplasm of unspecified lung: Secondary | ICD-10-CM | POA: Diagnosis not present

## 2019-03-30 DIAGNOSIS — Z923 Personal history of irradiation: Secondary | ICD-10-CM | POA: Insufficient documentation

## 2019-03-30 DIAGNOSIS — Z79899 Other long term (current) drug therapy: Secondary | ICD-10-CM | POA: Diagnosis not present

## 2019-03-30 DIAGNOSIS — E7800 Pure hypercholesterolemia, unspecified: Secondary | ICD-10-CM

## 2019-03-30 DIAGNOSIS — R5383 Other fatigue: Secondary | ICD-10-CM | POA: Diagnosis not present

## 2019-03-30 DIAGNOSIS — C3431 Malignant neoplasm of lower lobe, right bronchus or lung: Secondary | ICD-10-CM | POA: Diagnosis not present

## 2019-03-30 DIAGNOSIS — C3490 Malignant neoplasm of unspecified part of unspecified bronchus or lung: Secondary | ICD-10-CM

## 2019-03-30 DIAGNOSIS — Z9221 Personal history of antineoplastic chemotherapy: Secondary | ICD-10-CM | POA: Diagnosis not present

## 2019-03-30 DIAGNOSIS — Z87891 Personal history of nicotine dependence: Secondary | ICD-10-CM | POA: Insufficient documentation

## 2019-03-30 DIAGNOSIS — J439 Emphysema, unspecified: Secondary | ICD-10-CM | POA: Diagnosis not present

## 2019-03-30 LAB — CMP (CANCER CENTER ONLY)
ALT: 19 U/L (ref 0–44)
AST: 20 U/L (ref 15–41)
Albumin: 4.4 g/dL (ref 3.5–5.0)
Alkaline Phosphatase: 100 U/L (ref 38–126)
Anion gap: 11 (ref 5–15)
BUN: 20 mg/dL (ref 8–23)
CO2: 26 mmol/L (ref 22–32)
Calcium: 9.7 mg/dL (ref 8.9–10.3)
Chloride: 105 mmol/L (ref 98–111)
Creatinine: 0.87 mg/dL (ref 0.44–1.00)
GFR, Est AFR Am: >60 mL/min (ref >60)
GFR, Estimated: >60 mL/min (ref >60)
Glucose, Bld: 130 mg/dL — ABNORMAL HIGH (ref 70–99)
Potassium: 4 mmol/L (ref 3.5–5.1)
Sodium: 142 mmol/L (ref 135–145)
Total Bilirubin: 0.5 mg/dL (ref 0.3–1.2)
Total Protein: 7.6 g/dL (ref 6.5–8.1)

## 2019-03-30 LAB — CBC WITH DIFFERENTIAL (CANCER CENTER ONLY)
Abs Immature Granulocytes: 0.03 10*3/uL (ref 0.00–0.07)
Basophils Absolute: 0 10*3/uL (ref 0.0–0.1)
Basophils Relative: 0 %
Eosinophils Absolute: 0 10*3/uL (ref 0.0–0.5)
Eosinophils Relative: 1 %
HCT: 42.6 % (ref 36.0–46.0)
Hemoglobin: 14.3 g/dL (ref 12.0–15.0)
Immature Granulocytes: 1 %
Lymphocytes Relative: 21 %
Lymphs Abs: 1.2 10*3/uL (ref 0.7–4.0)
MCH: 31.9 pg (ref 26.0–34.0)
MCHC: 33.6 g/dL (ref 30.0–36.0)
MCV: 95.1 fL (ref 80.0–100.0)
Monocytes Absolute: 0.5 10*3/uL (ref 0.1–1.0)
Monocytes Relative: 9 %
Neutro Abs: 4.1 10*3/uL (ref 1.7–7.7)
Neutrophils Relative %: 68 %
Platelet Count: 235 10*3/uL (ref 150–400)
RBC: 4.48 MIL/uL (ref 3.87–5.11)
RDW: 12.7 % (ref 11.5–15.5)
WBC Count: 6 10*3/uL (ref 4.0–10.5)
nRBC: 0 % (ref 0.0–0.2)

## 2019-03-30 NOTE — Telephone Encounter (Signed)
Ordered

## 2019-03-31 ENCOUNTER — Ambulatory Visit (HOSPITAL_COMMUNITY)
Admission: RE | Admit: 2019-03-31 | Discharge: 2019-03-31 | Disposition: A | Discharge status: Home / Self Care | Payer: PPO | Source: Ambulatory Visit | Attending: Internal Medicine | Admitting: Internal Medicine

## 2019-03-31 DIAGNOSIS — C3490 Malignant neoplasm of unspecified part of unspecified bronchus or lung: Secondary | ICD-10-CM

## 2019-03-31 DIAGNOSIS — Z5111 Encounter for antineoplastic chemotherapy: Secondary | ICD-10-CM | POA: Diagnosis not present

## 2019-03-31 MED ORDER — SODIUM CHLORIDE (PF) 0.9 % IJ SOLN
INTRAMUSCULAR | Status: AC
  Filled 2019-03-31: qty 50

## 2019-03-31 MED ORDER — IOHEXOL 300 MG/ML  SOLN
75.0000 mL | Freq: Once | INTRAMUSCULAR | Status: AC | PRN
  Administered 2019-03-31: 75 mL via INTRAVENOUS

## 2019-04-03 ENCOUNTER — Other Ambulatory Visit: Payer: Self-pay

## 2019-04-03 ENCOUNTER — Encounter: Payer: Self-pay | Admitting: Internal Medicine

## 2019-04-03 ENCOUNTER — Inpatient Hospital Stay: Payer: PPO | Admitting: Internal Medicine

## 2019-04-03 VITALS — BP 147/61 | HR 103 | Temp 97.9°F | Resp 19 | Ht 59.0 in | Wt 137.4 lb

## 2019-04-03 DIAGNOSIS — C3490 Malignant neoplasm of unspecified part of unspecified bronchus or lung: Secondary | ICD-10-CM | POA: Diagnosis not present

## 2019-04-03 DIAGNOSIS — E042 Nontoxic multinodular goiter: Secondary | ICD-10-CM

## 2019-04-03 DIAGNOSIS — C3431 Malignant neoplasm of lower lobe, right bronchus or lung: Secondary | ICD-10-CM | POA: Diagnosis not present

## 2019-04-03 NOTE — Progress Notes (Signed)
[facility] [phone]   [phone]  OFFICE PROGRESS NOTE  [name], MD [place] [address]  DIAGNOSIS: Stage IIIA (T2a,N2, M0) non-small cell lung cancer,adenocarcinomapresenting with right lower lobe lung mass in addition to subcarinal lymphadenopathy and suspicious metastatic pulmonary nodules diagnosed in February 2019.  PRIOR THERAPY: 1) Concurrent chemoradiation with carboplatin for an AUC of 2 with paclitaxel 45 mg/m weekly. First dose given on 06/28/2017.  Status post 7 cycles.  Last dose was giving August 09, 2017 with partial response. 2) Consolidation immunotherapy with Imfinzi (Durvalumab) 10 mg/KG every 2 weeks, first dose 09/23/2017.  Status post 26 cycles. Last dose was given on Sep 08, 2018.  CURRENT THERAPY: Observation.  INTERVAL HISTORY: Katelyn Kennedy 73 y.o. female returns to the clinic today for follow-up visit.  The patient is feeling fine today with no concerning complaints except for mild fatigue.  She gained few pounds since his last visit.  The patient has no nausea, vomiting, diarrhea or constipation.  She denied having any chest pain, shortness of breath, cough or hemoptysis.  She has no headache or visual changes.  She denied having any fever or chills.  She had repeat CT scan of the chest performed recently and she is here for evaluation and discussion of her scan results.  MEDICAL HISTORY: Past Medical History:  Diagnosis Date  . Allergic rhinitis   . Arthritis   . Diabetes mellitus without complication (HCC)    diet control/no meds per pt  . Dyspnea   . History of chicken pox   . lung ca dx'd 04/2017  . Smoker     ALLERGIES:  is allergic to codeine.  MEDICATIONS:  Current Outpatient Medications  Medication Sig Dispense Refill  . acetaminophen (TYLENOL) 500 MG tablet Take 500 mg by mouth daily as needed for moderate pain or headache.    Marland Kitchen atorvastatin (LIPITOR) 20 MG tablet  TAKE 1 TABLET(20 MG) BY MOUTH DAILY 90 tablet 1  . fluticasone (FLONASE) 50 MCG/ACT nasal spray Place 2 sprays into both nostrils daily. 16 g 6  . lisinopril (ZESTRIL) 2.5 MG tablet Take 1 tablet (2.5 mg total) by mouth daily. 90 tablet 3  . Multiple Vitamin (MULTIVITAMIN) capsule Take 1 capsule by mouth daily.      Vladimir Faster Glycol-Propyl Glycol (SYSTANE) 0.4-0.3 % SOLN Apply to eye.    . traZODone (DESYREL) 50 MG tablet TAKE 1/2 TO 1 TABLET(25 TO 50 MG) BY MOUTH AT BEDTIME AS NEEDED FOR SLEEP 90 tablet 1   No current facility-administered medications for this visit.    SURGICAL HISTORY:  Past Surgical History:  Procedure Laterality Date  . ANKLE FRACTURE SURGERY Left 10 years ago   "hard to wake up from anesthesia" per pt  . [place]  . BREAST BIOPSY Right 1978   BENIGN CYST  . BREAST EXCISIONAL BIOPSY Left 2011   Benign  . BREAST EXCISIONAL BIOPSY Right 1985   Benign   . CATARACT EXTRACTION    . CATARACT EXTRACTION W/ INTRAOCULAR LENS IMPLANT Bilateral   . COLONOSCOPY  2011  . ELECTROMAGNETIC NAVIGATION BROCHOSCOPY N/A 06/08/2017   Procedure: ELECTROMAGNETIC NAVIGATION BRONCHOSCOPY;  Surgeon: Flora Lipps, MD;  Location: [facility] ORS;  Service: Cardiopulmonary;  Laterality: N/A;  . POLYPECTOMY  2011  . TRANSURETHRAL RESECTION OF BLADDER TUMOR WITH MITOMYCIN-C N/A 05/04/2017   Procedure: TRANSURETHRAL RESECTION OF BLADDER TUMOR WITH gemcitabine;  Surgeon: Abbie Sons,  MD;  Location: [facility] ORS;  Service: Urology;  Laterality: N/A;  . TUBAL LIGATION      REVIEW OF SYSTEMS:  Constitutional: positive for fatigue Eyes: negative Ears, nose, mouth, throat, and face: negative Respiratory: negative Cardiovascular: negative Gastrointestinal: negative Genitourinary:negative Integument/breast: negative Hematologic/lymphatic: negative Musculoskeletal:negative Neurological: negative Behavioral/Psych: negative Endocrine: negative Allergic/Immunologic: negative    PHYSICAL EXAMINATION: General appearance: alert, cooperative, fatigued and no distress Head: Normocephalic, without obvious abnormality, atraumatic Neck: no adenopathy, no JVD, supple, symmetrical, trachea midline and thyroid not enlarged, symmetric, no tenderness/mass/nodules Lymph nodes: Cervical, supraclavicular, and axillary nodes normal. Resp: clear to auscultation bilaterally Back: symmetric, no curvature. ROM normal. No CVA tenderness. Cardio: regular rate and rhythm, S1, S2 normal, no murmur, click, rub or gallop GI: soft, non-tender; bowel sounds normal; no masses,  no organomegaly Extremities: extremities normal, atraumatic, no cyanosis or edema Neurologic: Alert and oriented X 3, normal strength and tone. Normal symmetric reflexes. Normal coordination and gait  ECOG PERFORMANCE STATUS: 1 - Symptomatic but completely ambulatory  Blood pressure (!) 147/61, pulse (!) 103, temperature 97.9 F (36.6 C), temperature source Temporal, resp. rate 19, height 4\' 11"  (1.499 m), weight 137 lb 6.4 oz (62.3 kg), SpO2 96 %.  LABORATORY DATA: Lab Results  Component Value Date   WBC 6.0 03/30/2019   HGB 14.3 03/30/2019   HCT 42.6 03/30/2019   MCV 95.1 03/30/2019   PLT 235 03/30/2019      Chemistry      Component Value Date/Time   NA 142 03/30/2019 1103   K 4.0 03/30/2019 1103   CL 105 03/30/2019 1103   CO2 26 03/30/2019 1103   BUN 20 03/30/2019 1103   CREATININE 0.87 03/30/2019 1103      Component Value Date/Time   CALCIUM 9.7 03/30/2019 1103   ALKPHOS 100 03/30/2019 1103   AST 20 03/30/2019 1103   ALT 19 03/30/2019 1103   BILITOT 0.5 03/30/2019 1103       RADIOGRAPHIC STUDIES: CT Chest W Contrast  Result Date: 03/31/2019 CLINICAL DATA:  Non-small cell lung cancer, status post chemotherapy and XRT, immunotherapy complete. EXAM: CT CHEST WITH CONTRAST TECHNIQUE: Multidetector CT imaging of the chest was performed during intravenous contrast administration. CONTRAST:   73mL OMNIPAQUE IOHEXOL 300 MG/ML  SOLN COMPARISON:  12/26/2018 FINDINGS: Cardiovascular: The heart is normal in size. No pericardial effusion. No evidence of thoracic aortic aneurysm. Atherosclerotic calcifications of the aortic arch. Three vessel coronary atherosclerosis. Mediastinum/Nodes: No suspicious mediastinal lymphadenopathy. Bilateral thyroid nodules measuring up to 16 mm on the left, suggesting multinodular goiter, unchanged. Lungs/Pleura: Radiation changes in the right paramediastinal region/right lower lobe, unchanged. 7 mm right lower lobe nodule (series 5/image 104), unchanged. 9 mm subsolid right upper lobe nodule (series 5/image 28), unchanged. 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 Mild centrilobular emphysematous changes in the bilateral upper lobes. No focal consolidation. No pleural effusion or pneumothorax. Upper Abdomen: Visualized upper abdomen is grossly unremarkable. Musculoskeletal: Degenerative changes of the visualized thoracolumbar spine. IMPRESSION: Radiation changes in the right hemithorax. Stable right lung nodules. No findings specific for metastatic disease. Small bilateral thyroid nodules measuring up to 16 mm, favoring multinodular goiter. Given size greater than 15 mm, this technically meets criteria for thyroid ultrasound if clinically warranted in this cancer patient. Aortic Atherosclerosis (ICD10-I70.0) and Emphysema (ICD10-J43.9). Electronically Signed   By: Julian Hy M.D.   On: 03/31/2019 16:43    ASSESSMENT AND PLAN: This is a very pleasant 73 year old white female  with suspicious for stage IIIA non-small cell lung cancer, adenocarcinoma presented mainly with right lower lobe lung mass in addition to subcarinal lymphadenopathy but there was suspicious metastatic pulmonary nodules. The patient completed a course of concurrent chemoradiation with weekly carboplatin and paclitaxel.  She tolerated her treatment well with  no concerning complaints. The patient recently completed consolidation treatment with immunotherapy with Imfinzi (Durvalumab). She is status post 26 cycles. She tolerated it well without any adverse effects.  She is currently on observation and she is feeling fine with no concerning complaints except for mild fatigue. She had repeat CT scan of the chest performed recently.  I personally and independently reviewed the scans and discussed the results with the patient today. Her scan showed no concerning findings for disease progression in the chest but there was small bilateral thyroid nodules. I recommended for the patient to continue on observation with repeat CT scan of the chest in 3 months. Regarding the bilateral thyroid nodules, we will arrange for the patient to have ultrasound of the thyroid gland to rule out any suspicious lesions. She was advised to call immediately if she has any concerning symptoms in the interval. All questions were answered. The patient knows to call the clinic with any problems, questions or concerns. We can certainly see the patient much sooner if necessary.  Disclaimer: This note was dictated with voice recognition software. Similar sounding words can inadvertently be transcribed and may not be corrected upon review.

## 2019-04-04 ENCOUNTER — Ambulatory Visit: Payer: PPO | Admitting: Family Medicine

## 2019-04-04 ENCOUNTER — Other Ambulatory Visit (INDEPENDENT_AMBULATORY_CARE_PROVIDER_SITE_OTHER): Payer: PPO

## 2019-04-04 DIAGNOSIS — E119 Type 2 diabetes mellitus without complications: Secondary | ICD-10-CM | POA: Diagnosis not present

## 2019-04-04 DIAGNOSIS — E7800 Pure hypercholesterolemia, unspecified: Secondary | ICD-10-CM

## 2019-04-04 LAB — LIPID PANEL
Cholesterol: 203 mg/dL — ABNORMAL HIGH (ref 0–200)
HDL: 70.2 mg/dL (ref >39.00)
LDL Cholesterol: 103 mg/dL — ABNORMAL HIGH (ref 0–99)
NonHDL: 132.77
Total CHOL/HDL Ratio: 3
Triglycerides: 147 mg/dL (ref 0.0–149.0)
VLDL: 29.4 mg/dL (ref 0.0–40.0)

## 2019-04-04 LAB — COMPREHENSIVE METABOLIC PANEL
ALT: 18 U/L (ref 0–35)
AST: 21 U/L (ref 0–37)
Albumin: 4.7 g/dL (ref 3.5–5.2)
Alkaline Phosphatase: 93 U/L (ref 39–117)
BUN: 17 mg/dL (ref 6–23)
CO2: 26 mEq/L (ref 19–32)
Calcium: 10.3 mg/dL (ref 8.4–10.5)
Chloride: 103 mEq/L (ref 96–112)
Creatinine, Ser: 0.81 mg/dL (ref 0.40–1.20)
GFR: 68.92 mL/min (ref >60.00)
Glucose, Bld: 100 mg/dL — ABNORMAL HIGH (ref 70–99)
Potassium: 4.3 mEq/L (ref 3.5–5.1)
Sodium: 138 mEq/L (ref 135–145)
Total Bilirubin: 0.5 mg/dL (ref 0.2–1.2)
Total Protein: 7.6 g/dL (ref 6.0–8.3)

## 2019-04-04 LAB — MICROALBUMIN / CREATININE URINE RATIO
Creatinine,U: 31.4 mg/dL
Microalb Creat Ratio: 2.2 mg/g (ref 0.0–30.0)
Microalb, Ur: <0.7 mg/dL (ref 0.0–1.9)

## 2019-04-04 LAB — HEMOGLOBIN A1C: Hgb A1c MFr Bld: 5.5 % (ref 4.6–6.5)

## 2019-04-05 ENCOUNTER — Telehealth: Payer: Self-pay | Admitting: Internal Medicine

## 2019-04-05 NOTE — Telephone Encounter (Signed)
Scheduled per los. Called and spoke with patient. Confirmed appt 

## 2019-04-05 NOTE — Progress Notes (Signed)
No critical labs need to be addressed urgently. We will discuss labs in detail at upcoming office visit.   

## 2019-04-11 ENCOUNTER — Ambulatory Visit (INDEPENDENT_AMBULATORY_CARE_PROVIDER_SITE_OTHER): Payer: PPO | Admitting: Family Medicine

## 2019-04-11 ENCOUNTER — Encounter: Payer: Self-pay | Admitting: Family Medicine

## 2019-04-11 ENCOUNTER — Other Ambulatory Visit: Payer: Self-pay

## 2019-04-11 VITALS — Temp 97.4°F | Ht 59.0 in

## 2019-04-11 DIAGNOSIS — F5104 Psychophysiologic insomnia: Secondary | ICD-10-CM | POA: Diagnosis not present

## 2019-04-11 DIAGNOSIS — E042 Nontoxic multinodular goiter: Secondary | ICD-10-CM | POA: Diagnosis not present

## 2019-04-11 DIAGNOSIS — C3431 Malignant neoplasm of lower lobe, right bronchus or lung: Secondary | ICD-10-CM | POA: Diagnosis not present

## 2019-04-11 DIAGNOSIS — E119 Type 2 diabetes mellitus without complications: Secondary | ICD-10-CM

## 2019-04-11 DIAGNOSIS — E7800 Pure hypercholesterolemia, unspecified: Secondary | ICD-10-CM

## 2019-04-11 DIAGNOSIS — I10 Essential (primary) hypertension: Secondary | ICD-10-CM

## 2019-04-11 DIAGNOSIS — R809 Proteinuria, unspecified: Secondary | ICD-10-CM | POA: Diagnosis not present

## 2019-04-11 DIAGNOSIS — R030 Elevated blood-pressure reading, without diagnosis of hypertension: Secondary | ICD-10-CM | POA: Insufficient documentation

## 2019-04-11 HISTORY — DX: Essential (primary) hypertension: I10

## 2019-04-11 NOTE — Assessment & Plan Note (Signed)
Has follow up US in JAnuary 2021

## 2019-04-11 NOTE — Assessment & Plan Note (Signed)
ON ACEI.

## 2019-04-11 NOTE — Assessment & Plan Note (Signed)
Increase to  2 tabs at bedtime of trazodone for insomnia.  Call if not improving and we can try a trial of amitryptiline.

## 2019-04-11 NOTE — Progress Notes (Signed)
VIRTUAL VISIT Due to national recommendations of social distancing due to [place] 19, a virtual visit is felt to be most appropriate for this patient at this time.   I connected with the patient on 04/11/19 at 11:00 AM EST by virtual telehealth platform and verified that I am speaking with the correct person using two identifiers.   Interactive audio and video telecommunications were attempted between this provider and patient, however failed, due to patient having technical difficulties OR patient did not have access to video capability.  We continued and completed visit with audio only.    I discussed the limitations, risks, security and privacy concerns of performing an evaluation and management service by  virtual telehealth platform and the availability of in person appointments. I also discussed with the patient that there may be a patient responsible charge related to this service. The patient expressed understanding and agreed to proceed.  Patient location: Home Provider Location: [facility] Participants: Eliezer Lofts and [name]   Chief Complaint  Patient presents with  . Diabetes    History of Present Illness:  73 year old female with non-small cell lung cancer ( followed by ONC , Dr. Lewis Shock) presents for DM follow up.   At last ONC OV: Stage IIIA (T2a,N2, M0) non-small cell lung cancer,adenocarcinomapresenting with right lower lobe lung mass in addition to subcarinal lymphadenopathy and suspicious metastatic pulmonary nodules diagnosed in February 2019. -recently completed consolidation treatment with immunotherapy with Imfinzi (Durvalumab). She is status post 26 cycles  Recent  CT scan showed no concerning findings for disease progression in the chest but there was small bilateral thyroid nodules. Set up for thyroid US.  Plan follow up CT in 3 months.  Diabetes:   Diet controlled. Lab Results  Component Value Date   HGBA1C 5.5 04/04/2019  Using  medications without difficulties: Hypoglycemic episodes: Hyperglycemic episodes: Feet problems: no ulcer Blood Sugars averaging:not checking eye exam within last year: due.. set up in 05/2019  Elevated Cholesterol: LDL almost at goal < 100 on atorvastatin 20 mg daily. Lab Results  Component Value Date   CHOL 203 (H) 04/04/2019   HDL 70.20 04/04/2019   LDLCALC 103 (H) 04/04/2019   LDLDIRECT 118.7 01/24/2013   TRIG 147.0 04/04/2019   CHOLHDL 3 04/04/2019  Using medications without problems: none Muscle aches: none Diet compliance:good Exercise: occ Other complaints:  Elevated BP at last ONc OVs.. did have to walk far across parking lot.. per pt better on recehck.   Borderline control on lisinopril 2.5mg  for microalbuminuria BP Readings from Last 3 Encounters:  04/03/19 (!) 147/61  12/29/18 129/72  09/30/18 109/63  Using medication without problems or lightheadedness: none Chest pain with exertion:none Edema:none Short of breath:stable Average home BPs: Other issues:   Chronic insomnia: Trazodone 50 mg.. does not help  COVID 19 screen No recent travel or known exposure to [place] The patient denies respiratory symptoms of COVID 19 at this time.  The importance of social distancing was discussed today.   Review of Systems  Constitutional: Negative for chills and fever.  HENT: Negative for congestion and ear pain.   Eyes: Negative for pain and redness.  Respiratory: Negative for cough and shortness of breath.   Cardiovascular: Negative for chest pain, palpitations and leg swelling.  Gastrointestinal: Negative for abdominal pain, blood in stool, constipation, diarrhea, nausea and vomiting.  Genitourinary: Negative for dysuria.  Musculoskeletal: Negative for falls and myalgias.  Skin: Negative for rash.  Neurological: Negative for dizziness.  Psychiatric/Behavioral: Negative for depression. The patient is not nervous/anxious.       Past Medical History:  Diagnosis Date   . Allergic rhinitis   . Arthritis   . Diabetes mellitus without complication (HCC)    diet control/no meds per pt  . Dyspnea   . History of chicken pox   . lung ca dx'd 04/2017  . Smoker     reports that she quit smoking about 1 years ago. Her smoking use included cigarettes. She has a 20.00 pack-year smoking history. She has never used smokeless tobacco. She reports current alcohol use. She reports that she does not use drugs.   Current Outpatient Medications:  .  acetaminophen (TYLENOL) 500 MG tablet, Take 500 mg by mouth daily as needed for moderate pain or headache., Disp: , Rfl:  .  atorvastatin (LIPITOR) 20 MG tablet, TAKE 1 TABLET(20 MG) BY MOUTH DAILY, Disp: 90 tablet, Rfl: 1 .  fluticasone (FLONASE) 50 MCG/ACT nasal spray, Place 2 sprays into both nostrils daily., Disp: 16 g, Rfl: 6 .  lisinopril (ZESTRIL) 2.5 MG tablet, Take 1 tablet (2.5 mg total) by mouth daily., Disp: 90 tablet, Rfl: 3 .  Multiple Vitamin (MULTIVITAMIN) capsule, Take 1 capsule by mouth daily.  , Disp: , Rfl:  .  Polyethyl Glycol-Propyl Glycol (SYSTANE) 0.4-0.3 % SOLN, Apply to eye., Disp: , Rfl:  .  traZODone (DESYREL) 50 MG tablet, TAKE 1/2 TO 1 TABLET(25 TO 50 MG) BY MOUTH AT BEDTIME AS NEEDED FOR SLEEP, Disp: 90 tablet, Rfl: 1   Observations/Objective: Temperature (!) 97.4 F (36.3 C), temperature source Oral, height 4\' 11"  (1.499 m).  Physical Exam  Physical Exam Constitutional:      General: The patient is not in acute distress. Pulmonary:     Effort: Pulmonary effort is normal. No respiratory distress.  Neurological:     Mental Status: The patient is alert and oriented to person, place, and time.  Psychiatric:        Mood and Affect: Mood normal.        Behavior: Behavior normal.    Assessment and Plan Chronic insomnia Increase to  2 tabs at bedtime of trazodone for insomnia.  Call if not improving and we can try a trial of amitryptiline.   Multiple thyroid nodules Has follow up US in  JAnuary 2021  Diabetes mellitus with no complication Diet controlled.   HYPERCHOLESTEROLEMIA LDL almost at goal on statin. No SE.  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  Elevated BP without diagnosis of hypertension May need to increase ACEI. Pt will follow at home.     I discussed the assessment and treatment plan with the patient. The patient was provided an opportunity to ask questions and all were answered. The patient agreed with the plan and demonstrated an understanding of the instructions.   The patient was advised to call back or seek an in-person evaluation if the symptoms worsen or if the condition fails to improve as anticipated.  Total visit time 20 minutes, > 50% spent counseling and cordinating patients care.     Eliezer Lofts, MD

## 2019-04-11 NOTE — Assessment & Plan Note (Signed)
May need to increase ACEI. Pt will follow at home.

## 2019-04-11 NOTE — Assessment & Plan Note (Signed)
LDL almost at goal on statin. No SE.

## 2019-04-11 NOTE — Patient Instructions (Addendum)
Call to make appt for AMW/CPX in 6 months.   Increase to  2 tabs at bedtime of trazodone for insomnia.  Call if not improving and we can try a trial of amitryptiline.

## 2019-04-11 NOTE — Assessment & Plan Note (Signed)
Diet controlled.  

## 2019-04-26 ENCOUNTER — Other Ambulatory Visit: Payer: Self-pay

## 2019-04-26 ENCOUNTER — Ambulatory Visit (HOSPITAL_COMMUNITY)
Admission: RE | Admit: 2019-04-26 | Discharge: 2019-04-26 | Disposition: A | Discharge status: Home / Self Care | Payer: PPO | Source: Ambulatory Visit | Attending: Internal Medicine | Admitting: Internal Medicine

## 2019-04-26 DIAGNOSIS — E042 Nontoxic multinodular goiter: Secondary | ICD-10-CM | POA: Diagnosis not present

## 2019-04-26 DIAGNOSIS — C3490 Malignant neoplasm of unspecified part of unspecified bronchus or lung: Secondary | ICD-10-CM | POA: Diagnosis not present

## 2019-04-27 ENCOUNTER — Other Ambulatory Visit: Payer: Self-pay | Admitting: Family Medicine

## 2019-04-27 ENCOUNTER — Telehealth: Payer: Self-pay

## 2019-04-27 MED ORDER — TRAZODONE HCL 50 MG PO TABS: ORAL_TABLET | ORAL | 0 refills | Status: DC

## 2019-04-27 NOTE — Telephone Encounter (Signed)
Katelyn Kennedy with [facility] [address] left v/m that refill for trazodone 50 mg was sent with instructions taking 1/2 - 1 tab po hs prn. Pt tells [facility] that the instructions should be 2 tabs daily.Apolonio Schneiders request cb with verification of instructions. Looks like on 04/11/19 visit that trazodone was increased to 2 tabs at hs for insomnia.do not see change on med list.Please advise.

## 2019-04-27 NOTE — Telephone Encounter (Signed)
New Rx for Trazodone sent to [facility] with new instructions to take 2 tablets qhs prn.  FYI to [name].

## 2019-04-28 NOTE — Telephone Encounter (Signed)
Agree with prescription change.

## 2019-05-19 ENCOUNTER — Telehealth: Payer: Self-pay | Admitting: Family Medicine

## 2019-05-19 NOTE — Chronic Care Management (AMB) (Signed)
  Chronic Care Management   Note  05/19/2019 Name: Katelyn Kennedy MRN: [id number] DOB: [birth year]  Katelyn Kennedy is a 74 y.o. year old female who is a primary care patient of Bedsole, Amy E, MD. I reached out to Katelyn Kennedy by phone today in response to a referral sent by Katelyn Kennedy PCP, Katelyn Sanders, MD.   Katelyn Kennedy was given information about Chronic Care Management services today including:  1. CCM service includes personalized support from designated clinical staff supervised by her physician, including individualized plan of care and coordination with other care providers 2. 24/7 contact phone numbers for assistance for urgent and routine care needs. 3. Service will only be billed when office clinical staff spend 20 minutes or more in a month to coordinate care. 4. Only one practitioner may furnish and bill the service in a calendar month. 5. The patient may stop CCM services at any time (effective at the end of the month) by phone call to the office staff. 6. The patient will be responsible for cost sharing (co-pay) of up to 20% of the service fee (after annual deductible is met).  Patient agreed to services and verbal consent obtained.   Follow up plan:   Katelyn Kennedy [employer] Scheduler

## 2019-05-22 ENCOUNTER — Other Ambulatory Visit: Payer: Self-pay

## 2019-05-22 ENCOUNTER — Telehealth: Payer: Self-pay

## 2019-05-22 ENCOUNTER — Ambulatory Visit: Payer: PPO

## 2019-05-22 DIAGNOSIS — E119 Type 2 diabetes mellitus without complications: Secondary | ICD-10-CM

## 2019-05-22 DIAGNOSIS — E7800 Pure hypercholesterolemia, unspecified: Secondary | ICD-10-CM

## 2019-05-22 DIAGNOSIS — J301 Allergic rhinitis due to pollen: Secondary | ICD-10-CM

## 2019-05-22 DIAGNOSIS — R809 Proteinuria, unspecified: Secondary | ICD-10-CM

## 2019-05-22 DIAGNOSIS — F5104 Psychophysiologic insomnia: Secondary | ICD-10-CM

## 2019-05-22 NOTE — Chronic Care Management (AMB) (Signed)
Chronic Care Management Pharmacy  Name: Katelyn Kennedy  MRN: [id number] DOB: 12-09-1945  Chief Complaint/ HPI  Katelyn Kennedy,  74 y.o., female presents for their Initial CCM visit with the clinical pharmacist via telephone.  PCP : [name], MD  Their chronic conditions include: DM, osteoporosis, arthritis, hypercholesterolemia, non-small cell lung cancer, microalbuminuria, chronic insomnia   Patient concerns: none, doing well with her medications  Specialists: Mohammed, Oncology  Office Visits:  04/11/19 Diona Browner - DM controlled with diet, BP elevated in clinic, trazodone ineffective for sleep; increased trazodone from 50 mg to 100 mg (2 tabs)  09/27/18 - AWV  Consult Visit:  04/03/19 Mohammed, Oncology - current treatment is observation, patient reports some fatigue, scan results: no disease progression, small bilateral thyroid nodules; plan ultrasound of the thyroid   Allergies  Allergen Reactions  . Codeine Nausea Only   Medications: Outpatient Encounter Medications as of 05/22/2019  Medication Sig  . acetaminophen (TYLENOL) 500 MG tablet Take 500 mg by mouth daily as needed for moderate pain or headache.  Marland Kitchen atorvastatin (LIPITOR) 20 MG tablet TAKE 1 TABLET(20 MG) BY MOUTH DAILY  . fluticasone (FLONASE) 50 MCG/ACT nasal spray Place 2 sprays into both nostrils daily.  Marland Kitchen lisinopril (ZESTRIL) 2.5 MG tablet Take 1 tablet (2.5 mg total) by mouth daily.  . Multiple Vitamin (MULTIVITAMIN) capsule Take 1 capsule by mouth daily.    Vladimir Faster Glycol-Propyl Glycol (SYSTANE) 0.4-0.3 % SOLN Apply to eye.  . traZODone (DESYREL) 50 MG tablet TAKE 2 TABLETS BY MOUTH AT BEDTIME AS NEEDED FOR SLEEP   No facility-administered encounter medications on file as of 05/22/2019.    Current Diagnosis/Assessment: Goals    . Patient Stated     Starting 09/27/2018, I will continue to take medications as prescribed.     . Pharmacy Care Plan     Current Barriers:  . Chronic Disease  Management support, education, and care coordination needs related to diabetes, hyperlipidemia, insomnia, allergic rhinitis, history of microalbuminuria, preventative care   Pharmacist Clinical Goal(s):  Marland Kitchen Maintain healthy feet with diabetes. Patient will follow up with [name] for toenail evaluation. Continue to inspect feet daily for cuts and scrapes. . Resolve nasal congestion at night. Patient will begin taking Flonase in the morning to reduce insomnia. Patient will try over the counter Afrin nasal spray for a maximum of 3 days for nighttime nasal congestion. If unresolved, try a nasal saline spray daily with Flonase. . Improve nighttime awakenings. If unable to fall back asleep after 15-30 minutes, patient will begin reading to prevent anxiety and clock watching. Continue trazodone 2 tablets at bedtime. . Ensure blood pressure is well controlled. Pharmacist will recheck blood pressure at next office visit. Marland Kitchen Receive recommended preventative care. Patient will follow up with local pharmacy for COVID-19 vaccine (2 doses), influenza vaccine, and Shingrix (2 doses). . Achieve guideline recommended exercise goals weekly. Patient will work towards increasing exercise to at least 10 minutes daily.  Interventions: . Comprehensive medication review performed. . Patient counseling on over the counter medications. . Referred patient for medication packaging and delivery services.  Patient Self Care Activities:  . Patient takes medications daily, contacts pharmacy for refills on time, contacts doctor with questions or concerns, demonstrates understanding of medications  Initial goal documentation       Diabetes   Recent Relevant Labs: Lab Results  Component Value Date/Time   HGBA1C 5.5 04/04/2019 11:05 AM   HGBA1C 5.7 09/28/2018 10:15 AM   MICROALBUR <  0.7 04/04/2019 11:05 AM   MICROALBUR 0.3 07/21/2013 12:14 PM    Checking BG: Never Patient has failed these meds in past: none  Patient  is currently controlled on the following medications:  No pharmacotherapy  Last diabetic eye exam: has an appt. scheduled in the next several weeks  Lab Results  Component Value Date/Time   HMDIABEYEEXA No Retinopathy 01/11/2018 12:00 AM    Last diabetic foot exam: due Lab Results  Component Value Date/Time   HMDIABFOOTEX done 02/24/2018 12:00 AM     We discussed: diet and exercise extensively; patient reports ingrown toenail, files and keeps it clipped; not red, but tender  Plan: Continue control with diet and exercise. Follow up with [name] for toenail evaluation. Continue to inspect feet daily for cuts and scrapes.   HLD   Lipid Panel     Component Value Date/Time   CHOL 203 (H) 04/04/2019 1105   TRIG 147.0 04/04/2019 1105   HDL 70.20 04/04/2019 1105   CHOLHDL 3 04/04/2019 1105   VLDL 29.4 04/04/2019 1105   LDLCALC 103 (H) 04/04/2019 1105   LDLDIRECT 118.7 01/24/2013 0943   LDL goal < 100 Patient has failed these meds in past: none reported Patient is currently controlled on the following medications:   Atorvastatin 20 mg - take one tablet daily   We discussed:  diet and exercise extensively; LDL is trending upwards and slightly above goal. Patient is working on increasing daily exercise.   Plan: Continue current medications  Insomnia   Symptoms: Patient reports noticing a slight improvement in sleep onset with 2 tablets compared to 1 tablet daily (increased 04/11/19); she is falling asleep quickly, sleeps 3-4 hours then wakes up, feels groggy, takes 2 or more hours to fall back asleep  Precipitating factors: anxiety, mind racing  Caffeine: 2-3 cups of coffee in the morning only (7-8 ounces each)  Consistent bed/wake time: at or before 11 PM  Naps: occasional 15-20 minutes around 2PM  Bedroom environment: blackout curtains, no TV  Exercise: walks on nice days (around the block 10 minutes), but not daily Patient Goal: 7 hours uninterrupted sleep Patient  has failed these meds in past: none reported Patient is currently uncontrolled on the following medications:  Trazodone 50 mg - take 2 tablets at bedtime  We discussed:  Sleep hygiene, encouraged patient to get out of bed after 15-30 minutes if unable to fall back asleep and participate in boring activity (patient chose reading) for 10-30 minutes to prevent anxiety/clock watching; Flonase may cause insomnia when taken at bedtime.   Plan: Continue current medications. Begin taking flonase in the morning to prevent adverse effects on sleep. Begin reading at night after awakening if unable to fall back asleep.   History of Microalbuminuria  UACR (04/04/19): 2.2  GFR: (03/30/19) > 60 ml/min SCr: (03/30/19) 0.87 Patient has failed these meds in past: none reported Patient is currently controlled on the following medications:   Lisinopril 2.5 mg - once daily Home blood pressure: has not checked, elevated at oncology visit but reports they did not recheck blood pressure, walked a long way up to the appt; never had high blood pressure before, does not have a home monitor  We discussed:  diet and exercise  Plan: Continue current medications. Will schedule next appointment in office to reassess blood pressure and evaluate need for home blood pressure monitor.  Allergic Rhinitis  Patient has failed these meds in past:  Mucinex  Symptoms: sneezing, nasal congestion at night in  one nostril; reports Flonase helps with sneezing, tried Mucinex (regular) for nighttime congestion, but ineffective, also tried taking Flonase at night for nasal congestion, but no resolve. Patient is currently uncontrolled on the following medications:   Fluticasone 50 mcg - instill 2 sprays in each nostril at bedtime  We discussed: switching Flonase back to morning dosing as it did not help with nighttime symptoms and may impact sleep; trial of Afrin for 2-3 days   Plan: Continue current medications. Trial OTC Afrin nasal  spray for 2-3 days in addition to switching Flonase to morning dosing. If symptoms continue, add a nasal saline spray.  Medication Management  OTCs: acetaminophen 500 mg, multivitamin, systane eye drops   Pharmacy: [facility], [address]; interested in [facility] in the future   Adherence: no concerns  Affordability: no concerns  Vaccines: PCV13, PPSV23 up to date Due for annual influenza, Shingrix, Td (due 08/2019), COVID-19 --> discussed recommended vaccines and plan to receive vaccines   CCM Follow Up:  3 months, face to face, would like to schedule closer to time of visit  [name], PharmD Clinical Pharmacist [facility] at [facility] [phone]

## 2019-05-22 NOTE — Patient Instructions (Addendum)
[name],   It was a pleasure meeting you during our initial appointment on May 22, 2019. Below is a summary of the goals we discussed and components of chronic care management. Please contact me anytime with questions or concerns. I've included a handout on insomnia based on our discussion today.  Visit Information  Goals Addressed            This Visit's Progress   . Patient Stated   On track    Starting 09/27/2018, I will continue to take medications as prescribed.     . Pharmacy Care Plan       Current Barriers:  . Chronic Disease Management support, education, and care coordination needs related to diabetes, hyperlipidemia, insomnia, allergic rhinitis, history of microalbuminuria, preventative care   Pharmacist Clinical Goal(s):  Marland Kitchen Maintain healthy feet with diabetes. Patient will follow up with [name] for toenail evaluation. Continue to inspect feet daily for cuts and scrapes. . Resolve nasal congestion at night. Patient will begin taking Flonase in the morning to reduce insomnia. Patient will try over the counter Afrin nasal spray for a maximum of 3 days for nighttime nasal congestion. If unresolved, try a nasal saline spray daily with Flonase. . Improve nighttime awakenings. If unable to fall back asleep after 15-30 minutes, patient will begin reading to prevent anxiety and clock watching. Continue trazodone 2 tablets at bedtime. . Ensure blood pressure is well controlled. Pharmacist will recheck blood pressure at next office visit. Marland Kitchen Receive recommended preventative care. Patient will follow up with local pharmacy for COVID-19 vaccine (2 doses), influenza vaccine, and Shingrix (2 doses). . Achieve guideline recommended exercise goals weekly. Patient will work towards increasing exercise to at least 10 minutes daily.  Interventions: . Comprehensive medication review performed. . Patient counseling on over the counter medications. . Referred patient for  medication packaging and delivery services.  Patient Self Care Activities:  . Patient takes medications daily, contacts pharmacy for refills on time, contacts doctor with questions or concerns, demonstrates understanding of medications  Initial goal documentation        Katelyn Kennedy was given information about Chronic Care Management services today including:  1. CCM service includes personalized support from designated clinical staff supervised by her physician, including individualized plan of care and coordination with other care providers 2. 24/7 contact phone numbers for assistance for urgent and routine care needs. 3. Service will only be billed when office clinical staff spend 20 minutes or more in a month to coordinate care. 4. Only one practitioner may furnish and bill the service in a calendar month. 5. The patient may stop CCM services at any time (effective at the end of the month) by phone call to the office staff. 6. The patient will be responsible for cost sharing (co-pay) of up to 20% of the service fee (after annual deductible is met).  Patient agreed to services and verbal consent obtained.   We would like to see you again in person in 3 months. Our team will contact you to schedule follow up closer to your next visit.  Sincerely,  [name], PharmD Clinical Pharmacist [facility] at [facility] [phone]   Insomnia Insomnia is a sleep disorder that makes it difficult to fall asleep or stay asleep. Insomnia can cause fatigue, low energy, difficulty concentrating, mood swings, and poor performance at work or school. There are three different ways to classify insomnia:  Difficulty falling asleep.  Difficulty staying asleep.  Waking up  too early in the morning. Any type of insomnia can be long-term (chronic) or short-term (acute). Both are common. Short-term insomnia usually lasts for three months or less. Chronic insomnia occurs at least three  times a week for longer than three months. What are the causes? Insomnia may be caused by another condition, situation, or substance, such as:  Anxiety.  Certain medicines.  Gastroesophageal reflux disease (GERD) or other gastrointestinal conditions.  Asthma or other breathing conditions.  Restless legs syndrome, sleep apnea, or other sleep disorders.  Chronic pain.  Menopause.  Stroke.  Abuse of alcohol, tobacco, or illegal drugs.  Mental health conditions, such as depression.  Caffeine.  Neurological disorders, such as Alzheimer's disease.  An overactive thyroid (hyperthyroidism). Sometimes, the cause of insomnia may not be known. What increases the risk? Risk factors for insomnia include:  Gender. Women are affected more often than men.  Age. Insomnia is more common as you get older.  Stress.  Lack of exercise.  Irregular work schedule or working night shifts.  Traveling between different time zones.  Certain medical and mental health conditions. What are the signs or symptoms? If you have insomnia, the main symptom is having trouble falling asleep or having trouble staying asleep. This may lead to other symptoms, such as:  Feeling fatigued or having low energy.  Feeling nervous about going to sleep.  Not feeling rested in the morning.  Having trouble concentrating.  Feeling irritable, anxious, or depressed. How is this diagnosed? This condition may be diagnosed based on:  Your symptoms and medical history. Your health care provider may ask about: ? Your sleep habits. ? Any medical conditions you have. ? Your mental health.  A physical exam. How is this treated? Treatment for insomnia depends on the cause. Treatment may focus on treating an underlying condition that is causing insomnia. Treatment may also include:  Medicines to help you sleep.  Counseling or therapy.  Lifestyle adjustments to help you sleep better. Follow these  instructions at home: Eating and drinking   Limit or avoid alcohol, caffeinated beverages, and cigarettes, especially close to bedtime. These can disrupt your sleep.  Do not eat a large meal or eat spicy foods right before bedtime. This can lead to digestive discomfort that can make it hard for you to sleep. Sleep habits   Keep a sleep diary to help you and your health care provider figure out what could be causing your insomnia. Write down: ? When you sleep. ? When you wake up during the night. ? How well you sleep. ? How rested you feel the next day. ? Any side effects of medicines you are taking. ? What you eat and drink.  Make your bedroom a dark, comfortable place where it is easy to fall asleep. ? Put up shades or blackout curtains to block light from outside. ? Use a white noise machine to block noise. ? Keep the temperature cool.  Limit screen use before bedtime. This includes: ? Watching TV. ? Using your smartphone, tablet, or computer.  Stick to a routine that includes going to bed and waking up at the same times every day and night. This can help you fall asleep faster. Consider making a quiet activity, such as reading, part of your nighttime routine.  Try to avoid taking naps during the day so that you sleep better at night.  Get out of bed if you are still awake after 15 minutes of trying to sleep. Keep the lights down, but try  reading or doing a quiet activity. When you feel sleepy, go back to bed. General instructions  Take over-the-counter and prescription medicines only as told by your health care provider.  Exercise regularly, as told by your health care provider. Avoid exercise starting several hours before bedtime.  Use relaxation techniques to manage stress. Ask your health care provider to suggest some techniques that may work well for you. These may include: ? Breathing exercises. ? Routines to release muscle tension. ? Visualizing peaceful  scenes.  Make sure that you drive carefully. Avoid driving if you feel very sleepy.  Keep all follow-up visits as told by your health care provider. This is important. Contact a health care provider if:  You are tired throughout the day.  You have trouble in your daily routine due to sleepiness.  You continue to have sleep problems, or your sleep problems get worse. Get help right away if:  You have serious thoughts about hurting yourself or someone else. If you ever feel like you may hurt yourself or others, or have thoughts about taking your own life, get help right away. You can go to your nearest emergency department or call:  Your local emergency services (911 in the [place]  A suicide crisis helpline, such as the [place] at 484 405 7108. This is open 24 hours a day. Summary  Insomnia is a sleep disorder that makes it difficult to fall asleep or stay asleep.  Insomnia can be long-term (chronic) or short-term (acute).  Treatment for insomnia depends on the cause. Treatment may focus on treating an underlying condition that is causing insomnia.  Keep a sleep diary to help you and your health care provider figure out what could be causing your insomnia. This information is not intended to replace advice given to you by your health care provider. Make sure you discuss any questions you have with your health care provider. Document Revised: 03/12/2017 Document Reviewed: 01/07/2017 Elsevier Patient Education  2020 [employer].

## 2019-05-26 ENCOUNTER — Other Ambulatory Visit: Payer: PPO | Admitting: Urology

## 2019-05-29 NOTE — Telephone Encounter (Signed)
I would like to request a referral for Katelyn Kennedy to chronic care management pharmacy services focusing on the following conditions:   Diabetes mellitus with no complication [Q76.1]  HYPERCHOLESTEROLEMIA [E78.00]  [name], PharmD Clinical Pharmacist [facility] at [facility] [phone]

## 2019-05-30 NOTE — Telephone Encounter (Signed)
Please sign pharmacy referral.

## 2019-05-30 NOTE — Addendum Note (Signed)
Addended byEliezer Lofts E on: 05/30/2019 02:05 PM   Modules accepted: Orders

## 2019-05-30 NOTE — Addendum Note (Signed)
Addended by: Carter Kitten on: 05/30/2019 10:01 AM   Modules accepted: Orders

## 2019-05-30 NOTE — Addendum Note (Signed)
Addended by: Carter Kitten on: 05/30/2019 12:00 PM   Modules accepted: Orders

## 2019-05-31 ENCOUNTER — Other Ambulatory Visit: Payer: Self-pay

## 2019-05-31 ENCOUNTER — Ambulatory Visit
Admission: RE | Admit: 2019-05-31 | Discharge: 2019-05-31 | Disposition: A | Discharge status: Home / Self Care | Payer: PPO | Source: Ambulatory Visit | Attending: Family Medicine | Admitting: Family Medicine

## 2019-05-31 DIAGNOSIS — Z780 Asymptomatic menopausal state: Secondary | ICD-10-CM | POA: Diagnosis not present

## 2019-05-31 DIAGNOSIS — M810 Age-related osteoporosis without current pathological fracture: Secondary | ICD-10-CM

## 2019-05-31 DIAGNOSIS — M8589 Other specified disorders of bone density and structure, multiple sites: Secondary | ICD-10-CM | POA: Diagnosis not present

## 2019-06-30 ENCOUNTER — Ambulatory Visit (HOSPITAL_COMMUNITY)
Admission: RE | Admit: 2019-06-30 | Discharge: 2019-06-30 | Disposition: A | Discharge status: Home / Self Care | Payer: PPO | Source: Ambulatory Visit | Attending: Internal Medicine | Admitting: Internal Medicine

## 2019-06-30 ENCOUNTER — Inpatient Hospital Stay: Payer: PPO | Attending: Internal Medicine

## 2019-06-30 ENCOUNTER — Other Ambulatory Visit: Payer: Self-pay

## 2019-06-30 DIAGNOSIS — C3431 Malignant neoplasm of lower lobe, right bronchus or lung: Secondary | ICD-10-CM | POA: Diagnosis not present

## 2019-06-30 DIAGNOSIS — I10 Essential (primary) hypertension: Secondary | ICD-10-CM | POA: Diagnosis not present

## 2019-06-30 DIAGNOSIS — Z87891 Personal history of nicotine dependence: Secondary | ICD-10-CM | POA: Diagnosis not present

## 2019-06-30 DIAGNOSIS — M129 Arthropathy, unspecified: Secondary | ICD-10-CM | POA: Diagnosis not present

## 2019-06-30 DIAGNOSIS — Z9221 Personal history of antineoplastic chemotherapy: Secondary | ICD-10-CM | POA: Insufficient documentation

## 2019-06-30 DIAGNOSIS — E042 Nontoxic multinodular goiter: Secondary | ICD-10-CM | POA: Diagnosis not present

## 2019-06-30 DIAGNOSIS — C3490 Malignant neoplasm of unspecified part of unspecified bronchus or lung: Secondary | ICD-10-CM | POA: Diagnosis not present

## 2019-06-30 DIAGNOSIS — I700 Atherosclerosis of aorta: Secondary | ICD-10-CM | POA: Insufficient documentation

## 2019-06-30 DIAGNOSIS — Z79899 Other long term (current) drug therapy: Secondary | ICD-10-CM | POA: Insufficient documentation

## 2019-06-30 DIAGNOSIS — C7800 Secondary malignant neoplasm of unspecified lung: Secondary | ICD-10-CM | POA: Insufficient documentation

## 2019-06-30 DIAGNOSIS — E119 Type 2 diabetes mellitus without complications: Secondary | ICD-10-CM | POA: Diagnosis not present

## 2019-06-30 LAB — CMP (CANCER CENTER ONLY)
ALT: 19 U/L (ref 0–44)
AST: 19 U/L (ref 15–41)
Albumin: 4.1 g/dL (ref 3.5–5.0)
Alkaline Phosphatase: 108 U/L (ref 38–126)
Anion gap: 9 (ref 5–15)
BUN: 13 mg/dL (ref 8–23)
CO2: 25 mmol/L (ref 22–32)
Calcium: 9.8 mg/dL (ref 8.9–10.3)
Chloride: 106 mmol/L (ref 98–111)
Creatinine: 0.82 mg/dL (ref 0.44–1.00)
GFR, Est AFR Am: >60 mL/min (ref >60)
GFR, Estimated: >60 mL/min (ref >60)
Glucose, Bld: 113 mg/dL — ABNORMAL HIGH (ref 70–99)
Potassium: 4.4 mmol/L (ref 3.5–5.1)
Sodium: 140 mmol/L (ref 135–145)
Total Bilirubin: 0.5 mg/dL (ref 0.3–1.2)
Total Protein: 7.1 g/dL (ref 6.5–8.1)

## 2019-06-30 LAB — CBC WITH DIFFERENTIAL (CANCER CENTER ONLY)
Abs Immature Granulocytes: 0.03 10*3/uL (ref 0.00–0.07)
Basophils Absolute: 0 10*3/uL (ref 0.0–0.1)
Basophils Relative: 1 %
Eosinophils Absolute: 0.1 10*3/uL (ref 0.0–0.5)
Eosinophils Relative: 1 %
HCT: 42 % (ref 36.0–46.0)
Hemoglobin: 14.1 g/dL (ref 12.0–15.0)
Immature Granulocytes: 1 %
Lymphocytes Relative: 23 %
Lymphs Abs: 1.4 10*3/uL (ref 0.7–4.0)
MCH: 31.4 pg (ref 26.0–34.0)
MCHC: 33.6 g/dL (ref 30.0–36.0)
MCV: 93.5 fL (ref 80.0–100.0)
Monocytes Absolute: 0.6 10*3/uL (ref 0.1–1.0)
Monocytes Relative: 10 %
Neutro Abs: 4 10*3/uL (ref 1.7–7.7)
Neutrophils Relative %: 64 %
Platelet Count: 230 10*3/uL (ref 150–400)
RBC: 4.49 MIL/uL (ref 3.87–5.11)
RDW: 12.3 % (ref 11.5–15.5)
WBC Count: 6.1 10*3/uL (ref 4.0–10.5)
nRBC: 0 % (ref 0.0–0.2)

## 2019-06-30 MED ORDER — SODIUM CHLORIDE (PF) 0.9 % IJ SOLN
INTRAMUSCULAR | Status: AC
  Filled 2019-06-30: qty 50

## 2019-06-30 MED ORDER — IOHEXOL 300 MG/ML  SOLN
75.0000 mL | Freq: Once | INTRAMUSCULAR | Status: AC | PRN
  Administered 2019-06-30: 75 mL via INTRAVENOUS

## 2019-07-03 ENCOUNTER — Other Ambulatory Visit: Payer: Self-pay

## 2019-07-03 ENCOUNTER — Encounter: Payer: Self-pay | Admitting: Internal Medicine

## 2019-07-03 ENCOUNTER — Telehealth: Payer: Self-pay

## 2019-07-03 ENCOUNTER — Inpatient Hospital Stay (HOSPITAL_BASED_OUTPATIENT_CLINIC_OR_DEPARTMENT_OTHER): Payer: PPO | Admitting: Internal Medicine

## 2019-07-03 VITALS — BP 130/65 | HR 101 | Temp 98.2°F | Resp 20 | Ht 59.0 in | Wt 137.2 lb

## 2019-07-03 DIAGNOSIS — C3490 Malignant neoplasm of unspecified part of unspecified bronchus or lung: Secondary | ICD-10-CM

## 2019-07-03 DIAGNOSIS — E042 Nontoxic multinodular goiter: Secondary | ICD-10-CM

## 2019-07-03 DIAGNOSIS — C3431 Malignant neoplasm of lower lobe, right bronchus or lung: Secondary | ICD-10-CM

## 2019-07-03 DIAGNOSIS — E041 Nontoxic single thyroid nodule: Secondary | ICD-10-CM

## 2019-07-03 NOTE — Progress Notes (Signed)
[place] [phone]   [phone]  OFFICE PROGRESS NOTE  [name], MD [place] [address]  DIAGNOSIS: Stage IIIA (T2a,N2, M0) non-small cell lung cancer,adenocarcinomapresenting with right lower lobe lung mass in addition to subcarinal lymphadenopathy and suspicious metastatic pulmonary nodules diagnosed in February 2019.  PRIOR THERAPY: 1) Concurrent chemoradiation with carboplatin for an AUC of 2 with paclitaxel 45 mg/m weekly. First dose given on 06/28/2017.  Status post 7 cycles.  Last dose was giving August 09, 2017 with partial response. 2) Consolidation immunotherapy with Imfinzi (Durvalumab) 10 mg/KG every 2 weeks, first dose 09/23/2017.  Status post 26 cycles. Last dose was given on Sep 08, 2018.  CURRENT THERAPY: Observation.  INTERVAL HISTORY: Katelyn Kennedy 74 y.o. female returns to the clinic today for follow-up visit.  The patient is feeling fine today with no concerning complaints.  He denied having any chest pain, shortness of breath, cough or hemoptysis.  She denied having any fever or chills.  She has no nausea, vomiting, diarrhea or constipation.  She denied having any headache or visual changes.  She has no recent weight loss or night sweats.  The patient had repeat CT scan of the chest performed recently and she is here for evaluation and discussion of her scan results.   MEDICAL HISTORY: Past Medical History:  Diagnosis Date  . Allergic rhinitis   . Arthritis   . Diabetes mellitus without complication (HCC)    diet control/no meds per pt  . Dyspnea   . Essential hypertension 04/11/2019  . History of chicken pox   . lung ca dx'd 04/2017  . Smoker     ALLERGIES:  is allergic to codeine.  MEDICATIONS:  Current Outpatient Medications  Medication Sig Dispense Refill  . acetaminophen (TYLENOL) 500 MG tablet Take 500 mg by mouth daily as needed for moderate pain or headache.    Marland Kitchen  atorvastatin (LIPITOR) 20 MG tablet TAKE 1 TABLET(20 MG) BY MOUTH DAILY 90 tablet 1  . fluticasone (FLONASE) 50 MCG/ACT nasal spray Place 2 sprays into both nostrils daily. 16 g 6  . lisinopril (ZESTRIL) 2.5 MG tablet Take 1 tablet (2.5 mg total) by mouth daily. 90 tablet 3  . Multiple Vitamin (MULTIVITAMIN) capsule Take 1 capsule by mouth daily.      Vladimir Faster Glycol-Propyl Glycol (SYSTANE) 0.4-0.3 % SOLN Apply to eye.    . traZODone (DESYREL) 50 MG tablet TAKE 2 TABLETS BY MOUTH AT BEDTIME AS NEEDED FOR SLEEP 180 tablet 0   No current facility-administered medications for this visit.    SURGICAL HISTORY:  Past Surgical History:  Procedure Laterality Date  . ANKLE FRACTURE SURGERY Left 10 years ago   "hard to wake up from anesthesia" per pt  . [place]  . BREAST BIOPSY Right 1978   BENIGN CYST  . BREAST EXCISIONAL BIOPSY Left 2011   Benign  . BREAST EXCISIONAL BIOPSY Right 1985   Benign   . CATARACT EXTRACTION    . CATARACT EXTRACTION W/ INTRAOCULAR LENS IMPLANT Bilateral   . COLONOSCOPY  2011  . ELECTROMAGNETIC NAVIGATION BROCHOSCOPY N/A 06/08/2017   Procedure: ELECTROMAGNETIC NAVIGATION BRONCHOSCOPY;  Surgeon: Flora Lipps, MD;  Location: [facility];  Service: Cardiopulmonary;  Laterality: N/A;  . POLYPECTOMY  2011  . 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 TUBAL LIGATION      REVIEW OF SYSTEMS:  A comprehensive review of systems was negative.   PHYSICAL EXAMINATION: General appearance: alert, cooperative and no distress Head: Normocephalic, without obvious abnormality, atraumatic Neck: no adenopathy, no JVD, supple, symmetrical, trachea midline and thyroid not enlarged, symmetric, no tenderness/mass/nodules Lymph nodes: Cervical, supraclavicular, and axillary  nodes normal. Resp: clear to auscultation bilaterally Back: symmetric, no curvature. ROM normal. No CVA tenderness. Cardio: regular rate and rhythm, S1, S2 normal, no murmur, click, rub or gallop GI: soft, non-tender; bowel sounds normal; no masses,  no organomegaly Extremities: extremities normal, atraumatic, no cyanosis or edema  ECOG PERFORMANCE STATUS: 1 - Symptomatic but completely ambulatory  Blood pressure 130/65, pulse (!) 101, temperature 98.2 F (36.8 C), temperature source Oral, resp. rate 20, height 4\' 11"  (1.499 m), weight 137 lb 3.2 oz (62.2 kg), SpO2 95 %.  LABORATORY DATA: Lab Results  Component Value Date   WBC 6.1 06/30/2019   HGB 14.1 06/30/2019   HCT 42.0 06/30/2019   MCV 93.5 06/30/2019   PLT 230 06/30/2019      Chemistry      Component Value Date/Time   NA 140 06/30/2019 1035   K 4.4 06/30/2019 1035   CL 106 06/30/2019 1035   CO2 25 06/30/2019 1035   BUN 13 06/30/2019 1035   CREATININE 0.82 06/30/2019 1035      Component Value Date/Time   CALCIUM 9.8 06/30/2019 1035   ALKPHOS 108 06/30/2019 1035   AST 19 06/30/2019 1035   ALT 19 06/30/2019 1035   BILITOT 0.5 06/30/2019 1035       RADIOGRAPHIC STUDIES: CT Chest W Contrast  Result Date: 06/30/2019 CLINICAL DATA:  Restaging lung cancer. EXAM: CT CHEST WITH CONTRAST TECHNIQUE: Multidetector CT imaging of the chest was performed during intravenous contrast administration. CONTRAST:  47mL OMNIPAQUE IOHEXOL 300 MG/ML  SOLN COMPARISON:  03/31/2019 FINDINGS: Cardiovascular: Normal heart size. No pericardial effusion identified. Aortic atherosclerosis. Left main and 3 vessel coronary artery atherosclerotic calcification. Mediastinum/Nodes: Bilateral thyroid nodules measuring up to 1.8 cm long axis noted, image 9/2. The trachea appears patent and is midline. Normal appearance of the esophagus. No enlarged mediastinal or hilar lymph nodes. Lungs/Pleura: Bandlike area of fibrosis and masslike architectural  distortion within the right lung is stable from previous exam reflecting changes secondary to external beam radiation. -6 mm right lower lobe lung nodule is unchanged, image 101/5. 9 mm sub solid nodule in the right upper lobe is unchanged, image 26/5. Upper Abdomen: Unchanged 1.4 cm low-attenuation structure in left lobe of liver. No acute abnormality noted within the upper abdomen. Musculoskeletal: No abdominopelvic adenopathy identified. IMPRESSION: 1. Stable CT of the chest. Post treatment changes are again noted within the right lung. 2. Solid and sub solid right lung nodules are unchanged. No new suspicious lung nodule. 3. Left main and 3 vessel coronary artery atherosclerotic calcification. 4. Bilateral thyroid nodules. Recommend thyroid US (ref: J Am Coll Radiol. 2015 Feb;12(2): 143-50). Aortic Atherosclerosis (ICD10-I70.0). Electronically Signed   By: Kerby Moors M.D.   On: 06/30/2019 14:25    ASSESSMENT AND PLAN: This is a very pleasant 74 year old white female with suspicious for stage IIIA non-small cell lung cancer, adenocarcinoma presented mainly with right lower lobe lung mass in addition to subcarinal lymphadenopathy but there was suspicious metastatic pulmonary nodules. The patient completed a course of concurrent chemoradiation with weekly carboplatin and paclitaxel.  She tolerated  her treatment well with no concerning complaints. The patient recently completed consolidation treatment with immunotherapy with Imfinzi (Durvalumab). She is status post 26 cycles. She tolerated it well without any adverse effects.  The patient is currently on observation and she is feeling fine today with no concerning complaints. She had repeat CT scan of the chest performed recently.  I personally and independently reviewed the scans and discussed the results with the patient today. Her scan showed no concerning findings for disease recurrence or metastasis. I recommended for her to continue on observation  with repeat CT scan of the chest in 4 months. For the bilateral thyroid nodules, she had ultrasound of the thyroid that showed findings consistent with goiter.  I recommended for her to discuss the results with her primary care physician for management and referral to endocrinology if needed. She was advised to call immediately if she has any concerning symptoms in the interval. All questions were answered. The patient knows to call the clinic with any problems, questions or concerns. We can certainly see the patient much sooner if necessary.  Disclaimer: This note was dictated with voice recognition software. Similar sounding words can inadvertently be transcribed and may not be corrected upon review.

## 2019-07-03 NOTE — Telephone Encounter (Signed)
Pt said Dr Julien Nordmann advised pt to contact Dr Diona Browner (Dr Julien Nordmann sent in basket message to Dr Diona Browner also) about results from [place] of thyroid done on 04/26/2019 to see how Dr Diona Browner wanted to manage what is consistent with Goiter or refer pt to endocrinology if needed. Pt request cb. Pt last OV with Dr Diona Browner on 04/11/19.

## 2019-07-04 ENCOUNTER — Telehealth: Payer: Self-pay | Admitting: Internal Medicine

## 2019-07-04 NOTE — Telephone Encounter (Signed)
Katelyn Kennedy notified as instructed by telephone.  She is agreeable to endo referral.  She prefers to go to [place].

## 2019-07-04 NOTE — Telephone Encounter (Signed)
Scheduled per los. Called and spoke with patient. Confirmed appt 

## 2019-07-04 NOTE — Telephone Encounter (Signed)
Call pt  If she is agreeable .. I would recommend ENDO referral. MD or location preference?

## 2019-07-04 NOTE — Telephone Encounter (Signed)
Order placed

## 2019-07-11 ENCOUNTER — Other Ambulatory Visit: Payer: Self-pay | Admitting: *Deleted

## 2019-07-11 MED ORDER — ATORVASTATIN CALCIUM 20 MG PO TABS: ORAL_TABLET | ORAL | 1 refills | Status: DC

## 2019-07-19 ENCOUNTER — Other Ambulatory Visit: Payer: PPO | Admitting: Urology

## 2019-08-02 ENCOUNTER — Other Ambulatory Visit: Payer: PPO | Admitting: Urology

## 2019-08-11 ENCOUNTER — Other Ambulatory Visit: Payer: Self-pay

## 2019-08-11 ENCOUNTER — Ambulatory Visit: Payer: PPO | Admitting: Urology

## 2019-08-11 ENCOUNTER — Encounter: Payer: Self-pay | Admitting: Urology

## 2019-08-11 VITALS — BP 136/77 | HR 119 | Ht 59.0 in | Wt 136.0 lb

## 2019-08-11 DIAGNOSIS — C679 Malignant neoplasm of bladder, unspecified: Secondary | ICD-10-CM | POA: Diagnosis not present

## 2019-08-11 DIAGNOSIS — Z8551 Personal history of malignant neoplasm of bladder: Secondary | ICD-10-CM

## 2019-08-11 NOTE — Progress Notes (Signed)
   08/11/19  CC:  Chief Complaint  Patient presents with  . Cysto    HPI:  Blood pressure 136/77, pulse (!) 119, height 4\' 11"  (1.499 m), weight 136 lb (61.7 kg). NED. A&Ox3.   No respiratory distress   Abd soft, NT, ND Atrophic external genitalia with patent urethral meatus  Cystoscopy Procedure Note  Patient identification was confirmed, informed consent was obtained, and patient was prepped using Betadine solution.  Lidocaine jelly was administered per urethral meatus.    Procedure: - Flexible cystoscope introduced, without any difficulty.   - Thorough search of the bladder revealed:    normal urethral meatus    normal urothelium    no stones    no ulcers     no tumors    no urethral polyps    no trabeculation  - Ureteral orifices were normal in position and appearance.  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 -Continue annual surveillance cystoscopy   Abbie Sons, MD

## 2019-08-14 LAB — MICROSCOPIC EXAMINATION: Bacteria, UA: NONE SEEN

## 2019-08-14 LAB — URINALYSIS, COMPLETE
Bilirubin, UA: NEGATIVE
Glucose, UA: NEGATIVE
Ketones, UA: NEGATIVE
Leukocytes,UA: NEGATIVE
Nitrite, UA: NEGATIVE
Protein,UA: NEGATIVE
Specific Gravity, UA: 1.02 (ref 1.005–1.030)
Urobilinogen, Ur: 0.2 mg/dL (ref 0.2–1.0)
pH, UA: 5.5 (ref 5.0–7.5)

## 2019-08-25 DIAGNOSIS — H16223 Keratoconjunctivitis sicca, not specified as Sjogren's, bilateral: Secondary | ICD-10-CM | POA: Diagnosis not present

## 2019-08-25 DIAGNOSIS — H0288A Meibomian gland dysfunction right eye, upper and lower eyelids: Secondary | ICD-10-CM | POA: Diagnosis not present

## 2019-08-25 DIAGNOSIS — H02834 Dermatochalasis of left upper eyelid: Secondary | ICD-10-CM | POA: Diagnosis not present

## 2019-08-25 DIAGNOSIS — H11153 Pinguecula, bilateral: Secondary | ICD-10-CM | POA: Diagnosis not present

## 2019-08-25 DIAGNOSIS — E119 Type 2 diabetes mellitus without complications: Secondary | ICD-10-CM | POA: Diagnosis not present

## 2019-08-25 DIAGNOSIS — H18413 Arcus senilis, bilateral: Secondary | ICD-10-CM | POA: Diagnosis not present

## 2019-08-25 DIAGNOSIS — H02831 Dermatochalasis of right upper eyelid: Secondary | ICD-10-CM | POA: Diagnosis not present

## 2019-08-25 DIAGNOSIS — H35373 Puckering of macula, bilateral: Secondary | ICD-10-CM | POA: Diagnosis not present

## 2019-08-25 DIAGNOSIS — Z961 Presence of intraocular lens: Secondary | ICD-10-CM | POA: Diagnosis not present

## 2019-08-25 DIAGNOSIS — H0288B Meibomian gland dysfunction left eye, upper and lower eyelids: Secondary | ICD-10-CM | POA: Diagnosis not present

## 2019-08-25 LAB — HM DIABETES EYE EXAM

## 2019-09-01 ENCOUNTER — Encounter: Payer: Self-pay | Admitting: Family Medicine

## 2019-09-06 ENCOUNTER — Other Ambulatory Visit: Payer: Self-pay

## 2019-09-06 ENCOUNTER — Ambulatory Visit: Payer: PPO | Admitting: Endocrinology

## 2019-09-06 ENCOUNTER — Encounter: Payer: Self-pay | Admitting: Endocrinology

## 2019-09-06 VITALS — BP 120/70 | HR 95 | Ht 59.0 in | Wt 137.0 lb

## 2019-09-06 DIAGNOSIS — E042 Nontoxic multinodular goiter: Secondary | ICD-10-CM

## 2019-09-06 NOTE — Patient Instructions (Signed)
I would be happy to see you back here as needed In particular: If Dr Julien Nordmann finds the thyroid lumps are getting bigger on the CT scan, please come back here.   Also, if Dr Diona Browner finds your thyroid blood test abnormal in the future, please also come back here for that

## 2019-09-06 NOTE — Progress Notes (Signed)
Subjective:    Patient ID: Katelyn Kennedy, female    DOB: 1945/07/29, 74 y.o.   MRN: [id number]  HPI Pt is referred by Dr Diona Browner, for nodular thyroid.  Pt was noted to have a nodule at the thyroid in 2020.  she is unaware of ever having had thyroid problems in the past.  she has no h/o XRT or surgery to the neck.   Past Medical History:  Diagnosis Date  . Allergic rhinitis   . Arthritis   . Diabetes mellitus without complication (HCC)    diet control/no meds per pt  . Dyspnea   . Essential hypertension 04/11/2019  . History of chicken pox   . lung ca dx'd 04/2017  . Smoker     Past Surgical History:  Procedure Laterality Date  . ANKLE FRACTURE SURGERY Left 10 years ago   "hard to wake up from anesthesia" per pt  . [place]  . BREAST BIOPSY Right 1978   BENIGN CYST  . BREAST EXCISIONAL BIOPSY Left 2011   Benign  . BREAST EXCISIONAL BIOPSY Right 1985   Benign   . CATARACT EXTRACTION    . CATARACT EXTRACTION W/ INTRAOCULAR LENS IMPLANT Bilateral   . COLONOSCOPY  2011  . ELECTROMAGNETIC NAVIGATION BROCHOSCOPY N/A 06/08/2017   Procedure: ELECTROMAGNETIC NAVIGATION BRONCHOSCOPY;  Surgeon: Flora Lipps, MD;  Location: [facility];  Service: Cardiopulmonary;  Laterality: N/A;  . POLYPECTOMY  2011  . 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 TUBAL LIGATION      Social History   Socioeconomic History  . Marital status: Divorced    Spouse name: Not on file  . Number of children: Not on file  . Years of education: Not on file  . Highest education level: Not on file  Occupational History  . Not on file  Tobacco Use  . Smoking status: Former Smoker    Packs/day: 0.50    Years: 40.00    Pack years: 20.00    Types: Cigarettes    Quit date: 04/29/2017    Years  since quitting: 2.3  . Smokeless tobacco: Never Used  Substance and Sexual Activity  . Alcohol use: Yes    Comment: occassionally  . Drug use: No  . Sexual activity: Not Currently  Other Topics Concern  . Not on file  Social History Narrative  . Not on file   Social Determinants of Health   Financial Resource Strain:   . Difficulty of Paying Living Expenses:   Food Insecurity:   . Worried About Charity fundraiser in the Last Year:   . Arboriculturist in the Last Year:   Transportation Needs:   . Film/video editor (Medical):   Marland Kitchen Lack of Transportation (Non-Medical):   Physical Activity:   . Days of Exercise per Week:   . Minutes of Exercise per Session:   Stress:   . Feeling of Stress :   Social Connections:   . Frequency of Communication with Friends and Family:   . Frequency of Social Gatherings with Friends and Family:   . Attends Religious Services:   . Active Member of Clubs or Organizations:   . Attends Archivist Meetings:   Marland Kitchen Marital Status:   Intimate Partner Violence:   . Fear of Current or  Ex-Partner:   . Emotionally Abused:   Marland Kitchen Physically Abused:   . Sexually Abused:     Current Outpatient Medications on File Prior to Visit  Medication Sig Dispense Refill  . acetaminophen (TYLENOL) 500 MG tablet Take 500 mg by mouth daily as needed for moderate pain or headache.    Marland Kitchen atorvastatin (LIPITOR) 20 MG tablet TAKE 1 TABLET(20 MG) BY MOUTH DAILY 90 tablet 1  . fluticasone (FLONASE) 50 MCG/ACT nasal spray Place 2 sprays into both nostrils daily. 16 g 6  . lisinopril (ZESTRIL) 2.5 MG tablet Take 1 tablet (2.5 mg total) by mouth daily. 90 tablet 3  . Multiple Vitamin (MULTIVITAMIN) capsule Take 1 capsule by mouth daily.      Vladimir Faster Glycol-Propyl Glycol (SYSTANE) 0.4-0.3 % SOLN Apply to eye as needed.     . traZODone (DESYREL) 50 MG tablet TAKE 2 TABLETS BY MOUTH AT BEDTIME AS NEEDED FOR SLEEP (Patient taking differently: TAKE 1 TABLET BY MOUTH AT  BEDTIME AS NEEDED FOR SLEEP) 180 tablet 0   No current facility-administered medications on file prior to visit.    Allergies  Allergen Reactions  . Codeine Nausea Only    Family History  Problem Relation Age of Onset  . Diabetes Mother   . Hypothyroidism Mother   . Cancer Sister 60       BREAST  . Breast cancer Sister   . Hypothyroidism Sister   . Cancer Maternal Grandmother        COLON  . Colon cancer Maternal Grandmother 86  . Stomach cancer Neg Hx     BP 120/70   Pulse 95   Ht 4\' 11"  (1.499 m)   Wt 137 lb (62.1 kg)   SpO2 98%   BMI 27.67 kg/m     Review of Systems Denies weight change, hoarseness, neck pain, sob, dysphagia, diarrhea, flushing, and cold intolerance.      Objective:   Physical Exam VS: see vs page GEN: no distress HEAD: head: no deformity eyes: no periorbital swelling, no proptosis external nose and ears are normal NECK: supple, thyroid is slightly enlarged, with irreg surface, but I cannot appreciate any discreet nodule CHEST WALL: no deformity LUNGS: clear to auscultation CV: reg rate and rhythm, no murmur.  MUSCULOSKELETAL: muscle bulk and strength are grossly normal.  no obvious joint swelling.  gait is normal and steady EXTEMITIES: no deformity.  no edema PULSES: no carotid bruit NEURO:  cn 2-12 grossly intact.   readily moves all 4's.  sensation is intact to touch on all 4's SKIN:  Normal texture and temperature.  No rash or suspicious lesion is visible.   NODES:  None palpable at the neck PSYCH: alert, well-oriented.  Does not appear anxious nor depressed.     Lab Results  Component Value Date   TSH 1.837 09/27/2018   [name]: Findings suggestive of multinodular goiter.  None of the scattered bilateral predominantly cystic thyroid nodules meet imaging criteria to recommend percutaneous sampling or continued dedicated follow-up  I have reviewed outside records, and summarized: Pt was noted to have elevated MNG, and referred here.   CT was done to f/u lung cancer, and MNG was incidentally noted.        Assessment & Plan:  MNG, new to me.  Low risk for malignancy Lung cancer: serial chest CT's will incidentally follow the goiter.   Patient Instructions  I would be happy to see you back here as needed In particular: If Dr Julien Nordmann finds  the thyroid lumps are getting bigger on the CT scan, please come back here.   Also, if Dr Diona Browner finds your thyroid blood test abnormal in the future, please also come back here for that

## 2019-10-09 ENCOUNTER — Other Ambulatory Visit: Payer: Self-pay | Admitting: Family Medicine

## 2019-10-10 ENCOUNTER — Other Ambulatory Visit: Payer: PPO

## 2019-10-10 ENCOUNTER — Telehealth: Payer: Self-pay | Admitting: Family Medicine

## 2019-10-10 ENCOUNTER — Ambulatory Visit (INDEPENDENT_AMBULATORY_CARE_PROVIDER_SITE_OTHER): Payer: PPO

## 2019-10-10 ENCOUNTER — Ambulatory Visit: Payer: PPO | Admitting: Family Medicine

## 2019-10-10 ENCOUNTER — Other Ambulatory Visit (INDEPENDENT_AMBULATORY_CARE_PROVIDER_SITE_OTHER): Payer: PPO

## 2019-10-10 VITALS — Wt 137.0 lb

## 2019-10-10 DIAGNOSIS — E119 Type 2 diabetes mellitus without complications: Secondary | ICD-10-CM

## 2019-10-10 DIAGNOSIS — Z0000 Encounter for general adult medical examination without abnormal findings: Secondary | ICD-10-CM | POA: Diagnosis not present

## 2019-10-10 LAB — COMPREHENSIVE METABOLIC PANEL
ALT: 18 U/L (ref 0–35)
AST: 20 U/L (ref 0–37)
Albumin: 4.7 g/dL (ref 3.5–5.2)
Alkaline Phosphatase: 86 U/L (ref 39–117)
BUN: 14 mg/dL (ref 6–23)
CO2: 27 mEq/L (ref 19–32)
Calcium: 10.2 mg/dL (ref 8.4–10.5)
Chloride: 102 mEq/L (ref 96–112)
Creatinine, Ser: 0.78 mg/dL (ref 0.40–1.20)
GFR: 71.88 mL/min (ref >60.00)
Glucose, Bld: 106 mg/dL — ABNORMAL HIGH (ref 70–99)
Potassium: 4 mEq/L (ref 3.5–5.1)
Sodium: 138 mEq/L (ref 135–145)
Total Bilirubin: 0.8 mg/dL (ref 0.2–1.2)
Total Protein: 7.4 g/dL (ref 6.0–8.3)

## 2019-10-10 LAB — LIPID PANEL
Cholesterol: 195 mg/dL (ref 0–200)
HDL: 68.9 mg/dL (ref >39.00)
LDL Cholesterol: 103 mg/dL — ABNORMAL HIGH (ref 0–99)
NonHDL: 125.88
Total CHOL/HDL Ratio: 3
Triglycerides: 113 mg/dL (ref 0.0–149.0)
VLDL: 22.6 mg/dL (ref 0.0–40.0)

## 2019-10-10 LAB — HEMOGLOBIN A1C: Hgb A1c MFr Bld: 5.5 % (ref 4.6–6.5)

## 2019-10-10 NOTE — Patient Instructions (Signed)
[name] , Thank you for taking time to come for your Medicare Wellness Visit. I appreciate your ongoing commitment to your health goals. Please review the following plan we discussed and let me know if I can assist you in the future.   Screening recommendations/referrals: Colonoscopy: Up to date, completed 01/20/2017, due 01/2022 Mammogram: Up to date, completed 12/27/2018, due 12/2019 Bone Density: Up to date, completed 05/31/2019, due 05/2021 Recommended yearly ophthalmology/optometry visit for glaucoma screening and checkup Recommended yearly dental visit for hygiene and checkup  Vaccinations: Influenza vaccine: due 11/2019 Pneumococcal vaccine: Completed series Tdap vaccine: due, check with pharmacy/health dept Shingles vaccine: due, check with insurance for coverage   Covid-19:completed # 1 08/11/2019  Advanced directives: Advance directive discussed with you today. Even though you declined this today please call our office should you change your mind and we can give you the proper paperwork for you to fill out.  Conditions/risks identified: diabetes, hypercholesterolemia  Next appointment: Follow up in one year for your annual wellness visit    Preventive Care 23 Years and Older, Female Preventive care refers to lifestyle choices and visits with your health care provider that can promote health and wellness. What does preventive care include?  A yearly physical exam. This is also called an annual well check.  Dental exams once or twice a year.  Routine eye exams. Ask your health care provider how often you should have your eyes checked.  Personal lifestyle choices, including:  Daily care of your teeth and gums.  Regular physical activity.  Eating a healthy diet.  Avoiding tobacco and drug use.  Limiting alcohol use.  Practicing safe sex.  Taking low-dose aspirin every day.  Taking vitamin and mineral supplements as recommended by your health care provider. What  happens during an annual well check? The services and screenings done by your health care provider during your annual well check will depend on your age, overall health, lifestyle risk factors, and family history of disease. Counseling  Your health care provider may ask you questions about your:  Alcohol use.  Tobacco use.  Drug use.  Emotional well-being.  Home and relationship well-being.  Sexual activity.  Eating habits.  History of falls.  Memory and ability to understand (cognition).  Work and work Statistician.  Reproductive health. Screening  You may have the following tests or measurements:  Height, weight, and BMI.  Blood pressure.  Lipid and cholesterol levels. These may be checked every 5 years, or more frequently if you are over 87 years old.  Skin check.  Lung cancer screening. You may have this screening every year starting at age 25 if you have a 30-pack-year history of smoking and currently smoke or have quit within the past 15 years.  Fecal occult blood test (FOBT) of the stool. You may have this test every year starting at age 19.  Flexible sigmoidoscopy or colonoscopy. You may have a sigmoidoscopy every 5 years or a colonoscopy every 10 years starting at age 42.  Hepatitis C blood test.  Hepatitis B blood test.  Sexually transmitted disease (STD) testing.  Diabetes screening. This is done by checking your blood sugar (glucose) after you have not eaten for a while (fasting). You may have this done every 1-3 years.  Bone density scan. This is done to screen for osteoporosis. You may have this done starting at age 4.  Mammogram. This may be done every 1-2 years. Talk to your health care provider about how often you should have  regular mammograms. Talk with your health care provider about your test results, treatment options, and if necessary, the need for more tests. Vaccines  Your health care provider may recommend certain vaccines, such  as:  Influenza vaccine. This is recommended every year.  Tetanus, diphtheria, and acellular pertussis (Tdap, Td) vaccine. You may need a Td booster every 10 years.  Zoster vaccine. You may need this after age 55.  Pneumococcal 13-valent conjugate (PCV13) vaccine. One dose is recommended after age 50.  Pneumococcal polysaccharide (PPSV23) vaccine. One dose is recommended after age 68. Talk to your health care provider about which screenings and vaccines you need and how often you need them. This information is not intended to replace advice given to you by your health care provider. Make sure you discuss any questions you have with your health care provider. Document Released: 04/26/2015 Document Revised: 12/18/2015 Document Reviewed: 01/29/2015 Elsevier Interactive Patient Education  2017 [place] Prevention in the Home Falls can cause injuries. They can happen to people of all ages. There are many things you can do to make your home safe and to help prevent falls. What can I do on the outside of my home?  Regularly fix the edges of walkways and driveways and fix any cracks.  Remove anything that might make you trip as you walk through a door, such as a raised step or threshold.  Trim any bushes or trees on the path to your home.  Use bright outdoor lighting.  Clear any walking paths of anything that might make someone trip, such as rocks or tools.  Regularly check to see if handrails are loose or broken. Make sure that both sides of any steps have handrails.  Any raised decks and porches should have guardrails on the edges.  Have any leaves, snow, or ice cleared regularly.  Use sand or salt on walking paths during winter.  Clean up any spills in your garage right away. This includes oil or grease spills. What can I do in the bathroom?  Use night lights.  Install grab bars by the toilet and in the tub and shower. Do not use towel bars as grab bars.  Use  non-skid mats or decals in the tub or shower.  If you need to sit down in the shower, use a plastic, non-slip stool.  Keep the floor dry. Clean up any water that spills on the floor as soon as it happens.  Remove soap buildup in the tub or shower regularly.  Attach bath mats securely with double-sided non-slip rug tape.  Do not have throw rugs and other things on the floor that can make you trip. What can I do in the bedroom?  Use night lights.  Make sure that you have a light by your bed that is easy to reach.  Do not use any sheets or blankets that are too big for your bed. They should not hang down onto the floor.  Have a firm chair that has side arms. You can use this for support while you get dressed.  Do not have throw rugs and other things on the floor that can make you trip. What can I do in the kitchen?  Clean up any spills right away.  Avoid walking on wet floors.  Keep items that you use a lot in easy-to-reach places.  If you need to reach something above you, use a strong step stool that has a grab bar.  Keep electrical cords out of the  way.  Do not use floor polish or wax that makes floors slippery. If you must use wax, use non-skid floor wax.  Do not have throw rugs and other things on the floor that can make you trip. What can I do with my stairs?  Do not leave any items on the stairs.  Make sure that there are handrails on both sides of the stairs and use them. Fix handrails that are broken or loose. Make sure that handrails are as long as the stairways.  Check any carpeting to make sure that it is firmly attached to the stairs. Fix any carpet that is loose or worn.  Avoid having throw rugs at the top or bottom of the stairs. If you do have throw rugs, attach them to the floor with carpet tape.  Make sure that you have a light switch at the top of the stairs and the bottom of the stairs. If you do not have them, ask someone to add them for you. What  else can I do to help prevent falls?  Wear shoes that:  Do not have high heels.  Have rubber bottoms.  Are comfortable and fit you well.  Are closed at the toe. Do not wear sandals.  If you use a stepladder:  Make sure that it is fully opened. Do not climb a closed stepladder.  Make sure that both sides of the stepladder are locked into place.  Ask someone to hold it for you, if possible.  Clearly mark and make sure that you can see:  Any grab bars or handrails.  First and last steps.  Where the edge of each step is.  Use tools that help you move around (mobility aids) if they are needed. These include:  Canes.  Walkers.  Scooters.  Crutches.  Turn on the lights when you go into a dark area. Replace any light bulbs as soon as they burn out.  Set up your furniture so you have a clear path. Avoid moving your furniture around.  If any of your floors are uneven, fix them.  If there are any pets around you, be aware of where they are.  Review your medicines with your doctor. Some medicines can make you feel dizzy. This can increase your chance of falling. Ask your doctor what other things that you can do to help prevent falls. This information is not intended to replace advice given to you by your health care provider. Make sure you discuss any questions you have with your health care provider. Document Released: 01/24/2009 Document Revised: 09/05/2015 Document Reviewed: 05/04/2014 Elsevier Interactive Patient Education  2017 [employer].

## 2019-10-10 NOTE — Progress Notes (Signed)
Subjective:   Katelyn Kennedy is a 73 y.o. female who presents for Medicare Annual (Subsequent) preventive examination.  Review of Systems: N/A     I connected with the patient today by telephone and verified that I am speaking with the correct person using two identifiers. Location patient: home Location nurse: work Persons participating in the virtual visit: patient, Marine scientist.   I discussed the limitations, risks, security and privacy concerns of performing an evaluation and management service by telephone and the availability of in person appointments. I also discussed with the patient that there may be a patient responsible charge related to this service. The patient expressed understanding and verbally consented to this telephonic visit.    Interactive audio and video telecommunications were attempted between this nurse and patient, however failed, due to patient having technical difficulties OR patient did not have access to video capability.  We continued and completed visit with audio only.     Cardiac Risk Factors include: advanced age (>26men, >72 women);diabetes mellitus;Other (see comment), Risk factor comments: hypercholesterolemia     Objective:    Today's Vitals   10/10/19 0945  Weight: 137 lb (62.1 kg)   Body mass index is 27.67 kg/m.  Advanced Directives 10/10/2019 04/03/2019 09/27/2018 06/29/2018 06/15/2018 05/18/2018 04/21/2018  Does Patient Have a Medical Advance Directive? No No No No No No No  Would patient like information on creating a medical advance directive? No - Patient declined - No - Patient declined - - - -    Current Medications (verified) Outpatient Encounter Medications as of 10/10/2019  Medication Sig  . acetaminophen (TYLENOL) 500 MG tablet Take 500 mg by mouth daily as needed for moderate pain or headache.  Marland Kitchen atorvastatin (LIPITOR) 20 MG tablet TAKE 1 TABLET(20 MG) BY MOUTH DAILY  . fluticasone (FLONASE) 50 MCG/ACT nasal spray Place 2 sprays into  both nostrils daily.  Marland Kitchen lisinopril (ZESTRIL) 2.5 MG tablet TAKE 1 TABLET(2.5 MG) BY MOUTH DAILY  . Multiple Vitamin (MULTIVITAMIN) capsule Take 1 capsule by mouth daily.    Vladimir Faster Glycol-Propyl Glycol (SYSTANE) 0.4-0.3 % SOLN Apply to eye as needed.   . traZODone (DESYREL) 50 MG tablet TAKE 2 TABLETS BY MOUTH AT BEDTIME AS NEEDED FOR SLEEP (Patient taking differently: TAKE 1 TABLET BY MOUTH AT BEDTIME AS NEEDED FOR SLEEP)   No facility-administered encounter medications on file as of 10/10/2019.    Allergies (verified) Codeine   History: Past Medical History:  Diagnosis Date  . Allergic rhinitis   . Arthritis   . Diabetes mellitus without complication (HCC)    diet control/no meds per pt  . Dyspnea   . Essential hypertension 04/11/2019  . History of chicken pox   . lung ca dx'd 04/2017  . Smoker    Past Surgical History:  Procedure Laterality Date  . ANKLE FRACTURE SURGERY Left 10 years ago   "hard to wake up from anesthesia" per pt  . [place]  . BREAST BIOPSY Right 1978   BENIGN CYST  . BREAST EXCISIONAL BIOPSY Left 2011   Benign  . BREAST EXCISIONAL BIOPSY Right 1985   Benign   . CATARACT EXTRACTION    . CATARACT EXTRACTION W/ INTRAOCULAR LENS IMPLANT Bilateral   . COLONOSCOPY  2011  . ELECTROMAGNETIC NAVIGATION BROCHOSCOPY N/A 06/08/2017   Procedure: ELECTROMAGNETIC NAVIGATION BRONCHOSCOPY;  Surgeon: Flora Lipps, MD;  Location: [facility] ORS;  Service: Cardiopulmonary;  Laterality: N/A;  . POLYPECTOMY  2011  . TRANSURETHRAL RESECTION OF BLADDER  TUMOR WITH MITOMYCIN-C N/A 05/04/2017   Procedure: TRANSURETHRAL RESECTION OF BLADDER TUMOR WITH gemcitabine;  Surgeon: Abbie Sons, MD;  Location: [facility] ORS;  Service: Urology;  Laterality: N/A;  . TUBAL LIGATION     Family History  Problem Relation Age of Onset  . Diabetes Mother   . Hypothyroidism Mother   . Cancer Sister 74       BREAST  . Breast cancer Sister   . Hypothyroidism Sister   .  Cancer Maternal Grandmother        COLON  . Colon cancer Maternal Grandmother 86  . Stomach cancer Neg Hx    Social History   Socioeconomic History  . Marital status: Divorced    Spouse name: Not on file  . Number of children: Not on file  . Years of education: Not on file  . Highest education level: Not on file  Occupational History  . Not on file  Tobacco Use  . Smoking status: Former Smoker    Packs/day: 0.50    Years: 40.00    Pack years: 20.00    Types: Cigarettes    Quit date: 04/29/2017    Years since quitting: 2.4  . Smokeless tobacco: Never Used  Vaping Use  . Vaping Use: Never used  Substance and Sexual Activity  . Alcohol use: Yes    Comment: occassionally  . Drug use: No  . Sexual activity: Not Currently  Other Topics Concern  . Not on file  Social History Narrative  . Not on file   Social Determinants of Health   Financial Resource Strain: Low Risk   . Difficulty of Paying Living Expenses: Not hard at all  Food Insecurity: No Food Insecurity  . Worried About Charity fundraiser in the Last Year: Never true  . Ran Out of Food in the Last Year: Never true  Transportation Needs: No Transportation Needs  . Lack of Transportation (Medical): No  . Lack of Transportation (Non-Medical): No  Physical Activity: Inactive  . Days of Exercise per Week: 0 days  . Minutes of Exercise per Session: 0 min  Stress: No Stress Concern Present  . Feeling of Stress : Not at all  Social Connections:   . Frequency of Communication with Friends and Family:   . Frequency of Social Gatherings with Friends and Family:   . Attends Religious Services:   . Active Member of Clubs or Organizations:   . Attends Archivist Meetings:   Marland Kitchen Marital Status:     Tobacco Counseling Counseling given: Not Answered   Clinical Intake:  Pre-visit preparation completed: Yes  Pain : No/denies pain     Nutritional Risks: None Diabetes: Yes CBG done?: No Did pt. bring in  CBG monitor from home?: No  How often do you need to have someone help you when you read instructions, pamphlets, or other written materials from your doctor or pharmacy?: 1 - Never What is the last grade level you completed in school?: 12th  Diabetic: Yes Nutrition Risk Assessment:  Has the patient had any N/V/D within the last 2 months?  No  Does the patient have any non-healing wounds?  No  Has the patient had any unintentional weight loss or weight gain?  No   Diabetes:  Is the patient diabetic?  Yes  If diabetic, was a CBG obtained today?  No  Did the patient bring in their glucometer from home?  No  How often do you monitor your CBG's? Only at  office visits.   Financial Strains and Diabetes Management:  Are you having any financial strains with the device, your supplies or your medication? No .  Does the patient want to be seen by Chronic Care Management for management of their diabetes?  No  Would the patient like to be referred to a Nutritionist or for Diabetic Management?  No   Diabetic Exams:  Diabetic Eye Exam: Completed 08/25/2019 Diabetic Foot Exam: Overdue, Pt has been advised about the importance in completing this exam. Pt is scheduled for diabetic foot exam on 10/13/2019.   Interpreter Needed?: No  Information entered by :: CJohnson, LPN   Activities of Daily Living In your present state of health, do you have any difficulty performing the following activities: 10/10/2019  Hearing? N  Vision? N  Difficulty concentrating or making decisions? N  Walking or climbing stairs? N  Dressing or bathing? N  Doing errands, shopping? N  Preparing Food and eating ? N  Using the Toilet? N  In the past six months, have you accidently leaked urine? N  Do you have problems with loss of bowel control? N  Managing your Medications? N  Managing your Finances? N  Housekeeping or managing your Housekeeping? N  Some recent data might be hidden    Patient Care  Team: [name], MD as PCP - General [name], [facility] as Pharmacist (Pharmacist)  Indicate any recent Medical Services you may have received from other than [facility] providers in the past year (date may be approximate).     Assessment:   This is a routine wellness examination for Katelyn Kennedy.  Hearing/Vision screen  Hearing Screening   125Hz  250Hz  500Hz  1000Hz  2000Hz  3000Hz  4000Hz  6000Hz  8000Hz   Right ear:           Left ear:           Vision Screening Comments: Patient gets annual eye exams   Dietary issues and exercise activities discussed: Current Exercise Habits: The patient does not participate in regular exercise at present, Exercise limited by: None identified  Goals    . Patient Stated     Starting 09/27/2018, I will continue to take medications as prescribed.     . Patient Stated     10/10/2019, I will maintain and continue medications as prescribed. I will try to start walking in the future.     Marland Kitchen Pharmacy Care Plan     Current Barriers:  . Chronic Disease Management support, education, and care coordination needs related to diabetes, hyperlipidemia, insomnia, allergic rhinitis, history of microalbuminuria, preventative care   Pharmacist Clinical Goal(s):  Marland Kitchen Maintain healthy feet with diabetes. Patient will follow up with [name] for toenail evaluation. Continue to inspect feet daily for cuts and scrapes. . Resolve nasal congestion at night. Patient will begin taking Flonase in the morning to reduce insomnia. Patient will try over the counter Afrin nasal spray for a maximum of 3 days for nighttime nasal congestion. If unresolved, try a nasal saline spray daily with Flonase. . Improve nighttime awakenings. If unable to fall back asleep after 15-30 minutes, patient will begin reading to prevent anxiety and clock watching. Continue trazodone 2 tablets at bedtime. . Ensure blood pressure is well controlled. Pharmacist will recheck blood pressure at next office  visit. Marland Kitchen Receive recommended preventative care. Patient will follow up with local pharmacy for COVID-19 vaccine (2 doses), influenza vaccine, and Shingrix (2 doses). . Achieve guideline recommended exercise goals weekly. Patient will work towards increasing exercise  to at least 10 minutes daily.  Interventions: . Comprehensive medication review performed. . Patient counseling on over the counter medications. . Referred patient for medication packaging and delivery services.  Patient Self Care Activities:  . Patient takes medications daily, contacts pharmacy for refills on time, contacts doctor with questions or concerns, demonstrates understanding of medications  Initial goal documentation       Depression Screen PHQ 2/9 Scores 10/10/2019 09/27/2018 09/21/2017 08/17/2017 06/17/2017 08/07/2016 08/02/2015  PHQ - 2 Score 2 0 0 0 0 0 0  PHQ- 9 Score 5 0 - - - - -    Fall Risk Fall Risk  10/10/2019 09/27/2018 09/21/2017 08/17/2017 06/17/2017  Falls in the past year? 0 0 No No No  Number falls in past yr: 0 - - - -  Injury with Fall? 0 - - - -  Risk for fall due to : Medication side effect - - - -  Follow up Falls evaluation completed;Falls prevention discussed - - - -    Any stairs in or around the home? Yes  If so, are there any without handrails? No  Home free of loose throw rugs in walkways, pet beds, electrical cords, etc? Yes  Adequate lighting in your home to reduce risk of falls? Yes   ASSISTIVE DEVICES UTILIZED TO PREVENT FALLS:  Life alert? No  Use of a cane, walker or w/c? No  Grab bars in the bathroom? No  Shower chair or bench in shower? No  Elevated toilet seat or a handicapped toilet? No   TIMED UP AND GO:  Was the test performed? N/A, telephonic visit.    Cognitive Function: MMSE - Mini Mental State Exam 10/10/2019 09/27/2018  Orientation to time 5 5  Orientation to Place 5 5  Registration 3 3  Attention/ Calculation 5 0  Recall 3 3  Language- name 2 objects - 0   Language- repeat 1 1  Language- follow 3 step command - 0  Language- read & follow direction - 0  Write a sentence - 0  Copy design - 0  Total score - 17  Mini Cog  Mini-Cog screen was completed. Maximum score is 22. A value of 0 denotes this part of the MMSE was not completed or the patient failed this part of the Mini-Cog screening.       Immunizations Immunization History  Administered Date(s) Administered  . Influenza Split 01/15/2011, 01/19/2012  . Influenza Whole 01/11/2009, 12/24/2009  . Influenza,inj,Quad PF,6+ Mos 01/27/2013, 02/11/2017, 02/24/2018  . Influenza-Unspecified 11/28/2013  . PFIZER SARS-COV-2 Vaccination 08/11/2019  . Pneumococcal Conjugate-13 07/31/2014  . Pneumococcal Polysaccharide-23 09/03/2009  . Td 04/13/1997, 09/03/2009  . Zoster 11/29/2013    TDAP status: Due, Education has been provided regarding the importance of this vaccine. Advised may receive this vaccine at local pharmacy or Health Dept. Aware to provide a copy of the vaccination record if obtained from local pharmacy or Health Dept. Verbalized acceptance and understanding. Flu Vaccine status: Up to date Pneumococcal vaccine status: Up to date Covid-19 vaccine status: completed first vaccine on 08/11/2019.  Information provided on how to obtain second vaccine.   Qualifies for Shingles Vaccine? Yes   Zostavax completed Yes   Shingrix Completed?: No.    Education has been provided regarding the importance of this vaccine. Patient has been advised to call insurance company to determine out of pocket expense if they have not yet received this vaccine. Advised may also receive vaccine at local pharmacy or Health Dept. Verbalized acceptance  and understanding.  Screening Tests Health Maintenance  Topic Date Due  . FOOT EXAM  02/25/2019  . COVID-19 Vaccine (2 - Pfizer 2-dose series) 09/01/2019  . HEMOGLOBIN A1C  10/03/2019  . TETANUS/TDAP  10/10/2023 (Originally 09/04/2019)  . INFLUENZA VACCINE   11/12/2019  . OPHTHALMOLOGY EXAM  08/24/2020  . COLONOSCOPY  01/20/2022  . DEXA SCAN  Completed  . Hepatitis C Screening  Completed  . PNA vac Low Risk Adult  Completed    Health Maintenance  Health Maintenance Due  Topic Date Due  . FOOT EXAM  02/25/2019  . COVID-19 Vaccine (2 - Pfizer 2-dose series) 09/01/2019  . HEMOGLOBIN A1C  10/03/2019    Colorectal cancer screening: Completed 01/20/2017. Repeat every 5 years Mammogram status: Completed 12/27/2018. Repeat every year Bone Density status: Completed 05/31/2019. Results reflect: Bone density results: OSTEOPENIA. Repeat every 2 years.  Lung Cancer Screening: (Low Dose CT Chest recommended if Age 17-80 years, 30 pack-year currently smoking OR have quit w/in 15years.) does not qualify.    Additional Screening:  Hepatitis C Screening: does qualify; Completed 07/29/2015  Vision Screening: Recommended annual ophthalmology exams for early detection of glaucoma and other disorders of the eye. Is the patient up to date with their annual eye exam?  Yes  Who is the provider or what is the name of the office in which the patient attends annual eye exams? Dr. Druscilla Brownie If pt is not established with a provider, would they like to be referred to a provider to establish care? No .   Dental Screening: Recommended annual dental exams for proper oral hygiene  Community Resource Referral / Chronic Care Management: CRR required this visit?  No   CCM required this visit?  No      Plan:     I have personally reviewed and noted the following in the patient's chart:   . Medical and social history . Use of alcohol, tobacco or illicit drugs  . Current medications and supplements . Functional ability and status . Nutritional status . Physical activity . Advanced directives . List of other physicians . Hospitalizations, surgeries, and ER visits in previous 12 months . Vitals . Screenings to include cognitive, depression, and  falls . Referrals and appointments  In addition, I have reviewed and discussed with patient certain preventive protocols, quality metrics, and best practice recommendations. A written personalized care plan for preventive services as well as general preventive health recommendations were provided to patient.   Due to this being a telephonic visit, the after visit summary with patients personalized plan was offered to patient via mail or my-chart.  Patient preferred to pick up at office at next visit.   Andrez Grime, LPN   [id number]

## 2019-10-10 NOTE — Progress Notes (Signed)
No critical labs need to be addressed urgently. We will discuss labs in detail at upcoming office visit.   

## 2019-10-10 NOTE — Progress Notes (Signed)
PCP notes:  Health Maintenance: Covid #2- due Tdap- insurance/financial Foot exam- due   Abnormal Screenings: none   Patient concerns: Discuss dexa results Discuss sleep issues   Nurse concerns: none   Next PCP appt.: 10/13/2019 @ 11:40 am

## 2019-10-10 NOTE — Telephone Encounter (Signed)
-----   Message from Cloyd Stagers, RT sent at 09/26/2019  2:42 PM EDT ----- Regarding: Lab Orders for Tuesday 6.29.2021 Please place lab orders for Tuesday 6.29.2021, office visit for physical on Friday 7.2.2021 Thank you, Dyke Maes RT(R)

## 2019-10-13 ENCOUNTER — Other Ambulatory Visit: Payer: Self-pay

## 2019-10-13 ENCOUNTER — Encounter: Payer: Self-pay | Admitting: Family Medicine

## 2019-10-13 ENCOUNTER — Ambulatory Visit (INDEPENDENT_AMBULATORY_CARE_PROVIDER_SITE_OTHER): Payer: PPO | Admitting: Family Medicine

## 2019-10-13 VITALS — BP 102/58 | HR 88 | Ht 59.5 in | Wt 135.0 lb

## 2019-10-13 DIAGNOSIS — R801 Persistent proteinuria, unspecified: Secondary | ICD-10-CM | POA: Diagnosis not present

## 2019-10-13 DIAGNOSIS — E785 Hyperlipidemia, unspecified: Secondary | ICD-10-CM

## 2019-10-13 DIAGNOSIS — E042 Nontoxic multinodular goiter: Secondary | ICD-10-CM | POA: Diagnosis not present

## 2019-10-13 DIAGNOSIS — Z0000 Encounter for general adult medical examination without abnormal findings: Secondary | ICD-10-CM

## 2019-10-13 DIAGNOSIS — E1169 Type 2 diabetes mellitus with other specified complication: Secondary | ICD-10-CM

## 2019-10-13 DIAGNOSIS — E119 Type 2 diabetes mellitus without complications: Secondary | ICD-10-CM

## 2019-10-13 DIAGNOSIS — F5104 Psychophysiologic insomnia: Secondary | ICD-10-CM | POA: Diagnosis not present

## 2019-10-13 LAB — HM DIABETES FOOT EXAM

## 2019-10-13 MED ORDER — AMITRIPTYLINE HCL 25 MG PO TABS: 25.0000 mg | ORAL_TABLET | Freq: Every day | ORAL | 3 refills | Status: DC

## 2019-10-13 NOTE — Assessment & Plan Note (Signed)
On low dose ACEI

## 2019-10-13 NOTE — Assessment & Plan Note (Signed)
Followed by ENDO

## 2019-10-13 NOTE — Progress Notes (Signed)
Chief Complaint  Patient presents with  . Annual Exam    History of Present Illness: HPI  The patient presents for complete physical and review of chronic health problems. He/She also has the following acute concerns today: none  The patient saw a LPN or RN for medicare wellness visit. Prevention and wellness was reviewed in detail. Note reviewed and important notes copied below.  Health Maintenance: Covid #2- due Tdap- insurance/financial Foot exam- due   Abnormal Screenings: none   Patient concerns: Discuss dexa results Discuss sleep issues  10/13/19   trazodone does not seem to be helping with sleep..  2 does not help.Marland Kitchen and felt groggy. She would like to try amitriptyline.  Currently completed treatment for  Lung cancer Last Onc note reviewed.  Following CT scans q 4 month.. no sign of recurrence or mets. Hx of bladder cancer  Thyroid nodules followed by ENDO Dr. Loanne Drilling. Last OV note reviewed from 09/06/2019  Diabetes:   Lab Results  Component Value Date   HGBA1C 5.5 10/10/2019  Using medications without difficulties: Hypoglycemic episodes: Hyperglycemic episodes: Feet problems: no ulcers Blood Sugars averaging: not checking eye exam within last year: yes  Elevated Cholesterol:  Almost at goal on atorvastatin. Lab Results  Component Value Date   CHOL 195 10/10/2019   HDL 68.90 10/10/2019   LDLCALC 103 (H) 10/10/2019   LDLDIRECT 118.7 01/24/2013   TRIG 113.0 10/10/2019   CHOLHDL 3 10/10/2019  Using medications without problems:none Muscle aches: none Diet compliance: good Exercise: minimal Other complaints: Body mass index is 26.81 kg/m.  This visit occurred during the SARS-CoV-2 public health emergency.  Safety protocols were in place, including screening questions prior to the visit, additional usage of staff PPE, and extensive cleaning of exam room while observing appropriate contact time as indicated for disinfecting solutions.   COVID  19 screen:  No recent travel or known exposure to COVID19 The patient denies respiratory symptoms of COVID 19 at this time. The importance of social distancing was discussed today.     Review of Systems  Constitutional: Negative for chills and fever.  HENT: Negative for congestion and ear pain.   Eyes: Negative for pain and redness.  Respiratory: Negative for cough and shortness of breath.   Cardiovascular: Negative for chest pain, palpitations and leg swelling.  Gastrointestinal: Negative for abdominal pain, blood in stool, constipation, diarrhea, nausea and vomiting.  Genitourinary: Negative for dysuria.  Musculoskeletal: Negative for falls and myalgias.  Skin: Negative for rash.  Neurological: Negative for dizziness.  Psychiatric/Behavioral: Negative for depression. The patient is not nervous/anxious.       Past Medical History:  Diagnosis Date  . Allergic rhinitis   . Arthritis   . Diabetes mellitus without complication (HCC)    diet control/no meds per pt  . Dyspnea   . Essential hypertension 04/11/2019  . History of chicken pox   . lung ca dx'd 04/2017  . Smoker     reports that she quit smoking about 2 years ago. Her smoking use included cigarettes. She has a 20.00 pack-year smoking history. She has never used smokeless tobacco. She reports current alcohol use. She reports that she does not use drugs.   Current Outpatient Medications:  .  acetaminophen (TYLENOL) 500 MG tablet, Take 500 mg by mouth daily as needed for moderate pain or headache., Disp: , Rfl:  .  atorvastatin (LIPITOR) 20 MG tablet, TAKE 1 TABLET(20 MG) BY MOUTH DAILY, Disp: 90 tablet, Rfl: 1 .  lisinopril (ZESTRIL)  2.5 MG tablet, TAKE 1 TABLET(2.5 MG) BY MOUTH DAILY, Disp: 90 tablet, Rfl: 0 .  Multiple Vitamin (MULTIVITAMIN) capsule, Take 1 capsule by mouth daily.  , Disp: , Rfl:  .  Polyethyl Glycol-Propyl Glycol (SYSTANE) 0.4-0.3 % SOLN, Apply to eye as needed. , Disp: , Rfl:  .  traZODone (DESYREL) 50  MG tablet, TAKE 2 TABLETS BY MOUTH AT BEDTIME AS NEEDED FOR SLEEP (Patient taking differently: TAKE 1 TABLET BY MOUTH AT BEDTIME AS NEEDED FOR SLEEP), Disp: 180 tablet, Rfl: 0   Observations/Objective: Blood pressure (!) 102/58, pulse 88, height 4' 11.5" (1.511 m), weight 135 lb (61.2 kg), SpO2 95 %.  Physical Exam Constitutional:      General: She is not in acute distress.    Appearance: Normal appearance. She is well-developed. She is not ill-appearing or toxic-appearing.  HENT:     Head: Normocephalic.     Right Ear: Hearing, tympanic membrane, ear canal and external ear normal.     Left Ear: Hearing, tympanic membrane, ear canal and external ear normal.     Nose: Nose normal.  Eyes:     General: Lids are normal. Lids are everted, no foreign bodies appreciated.     Conjunctiva/sclera: Conjunctivae normal.     Pupils: Pupils are equal, round, and reactive to light.  Neck:     Thyroid: No thyroid mass or thyromegaly.     Vascular: No carotid bruit.     Trachea: Trachea normal.  Cardiovascular:     Rate and Rhythm: Normal rate and regular rhythm.     Heart sounds: Normal heart sounds, S1 normal and S2 normal. No murmur heard.  No gallop.   Pulmonary:     Effort: Pulmonary effort is normal. No respiratory distress.     Breath sounds: Normal breath sounds. No wheezing, rhonchi or rales.  Abdominal:     General: Bowel sounds are normal. There is no distension or abdominal bruit.     Palpations: Abdomen is soft. There is no fluid wave or mass.     Tenderness: There is no abdominal tenderness. There is no guarding or rebound.     Hernia: No hernia is present.  Musculoskeletal:     Cervical back: Normal range of motion and neck supple.  Lymphadenopathy:     Cervical: No cervical adenopathy.  Skin:    General: Skin is warm and dry.     Findings: No rash.  Neurological:     Mental Status: She is alert.     Cranial Nerves: No cranial nerve deficit.     Sensory: No sensory deficit.   Psychiatric:        Mood and Affect: Mood is not anxious or depressed.        Speech: Speech normal.        Behavior: Behavior normal. Behavior is cooperative.        Judgment: Judgment normal.      Diabetic foot exam: Normal inspection No skin breakdown No calluses  Normal DP pulses Normal sensation to light touch and monofilament Nails normal  Assessment and Plan    The patient's preventative maintenance and recommended screening tests for an annual wellness exam were reviewed in full today. Brought up to date unless services declined.  Counselled on the importance of diet, exercise, and its role in overall health and mortality. The patient's FH and SH was reviewed, including their home life, tobacco status, and drug and alcohol status.   Vaccines: Td refused, PNA 13,23, COVID19 up  to date Pap/DVE: not indicated Mammo: 12/2018  Bone Density: 05/2019 osteopenia. Colon: 2018 repeat in 5 years Smoking Status: QUIT!  ETOH/ drug [id number] Hep C: done  MICROALBUMINURIA On low dose ACEI  Multiple thyroid nodules Followed by ENDO  Hyperlipidemia associated with type 2 diabetes mellitus ([place]) Almost at goal on statin.  Diabetes mellitus with no complication At goal on no med.  Chronic insomnia  Stop trazodone  ( SE and not effective)and try trial of amitriptyline.    Eliezer Lofts, MD

## 2019-10-13 NOTE — Patient Instructions (Addendum)
Work on increasing exercise to 3-5 times a week.   Preventive Care 39 Years and Older, Female Preventive care refers to lifestyle choices and visits with your health care provider that can promote health and wellness. This includes:  A yearly physical exam. This is also called an annual well check.  Regular dental and eye exams.  Immunizations.  Screening for certain conditions.  Healthy lifestyle choices, such as diet and exercise. What can I expect for my preventive care visit? Physical exam Your health care provider will check:  Height and weight. These may be used to calculate body mass index (BMI), which is a measurement that tells if you are at a healthy weight.  Heart rate and blood pressure.  Your skin for abnormal spots. Counseling Your health care provider may ask you questions about:  Alcohol, tobacco, and drug use.  Emotional well-being.  Home and relationship well-being.  Sexual activity.  Eating habits.  History of falls.  Memory and ability to understand (cognition).  Work and work Statistician.  Pregnancy and menstrual history. What immunizations do I need?  Influenza (flu) vaccine  This is recommended every year. Tetanus, diphtheria, and pertussis (Tdap) vaccine  You may need a Td booster every 10 years. Varicella (chickenpox) vaccine  You may need this vaccine if you have not already been vaccinated. Zoster (shingles) vaccine  You may need this after age 96. Pneumococcal conjugate (PCV13) vaccine  One dose is recommended after age 84. Pneumococcal polysaccharide (PPSV23) vaccine  One dose is recommended after age 80. Measles, mumps, and rubella (MMR) vaccine  You may need at least one dose of MMR if you were born in 1957 or later. You may also need a second dose. Meningococcal conjugate (MenACWY) vaccine  You may need this if you have certain conditions. Hepatitis A vaccine  You may need this if you have certain conditions or  if you travel or work in places where you may be exposed to hepatitis A. Hepatitis B vaccine  You may need this if you have certain conditions or if you travel or work in places where you may be exposed to hepatitis B. Haemophilus influenzae type b (Hib) vaccine  You may need this if you have certain conditions. You may receive vaccines as individual doses or as more than one vaccine together in one shot (combination vaccines). Talk with your health care provider about the risks and benefits of combination vaccines. What tests do I need? Blood tests  Lipid and cholesterol levels. These may be checked every 5 years, or more frequently depending on your overall health.  Hepatitis C test.  Hepatitis B test. Screening  Lung cancer screening. You may have this screening every year starting at age 26 if you have a 30-pack-year history of smoking and currently smoke or have quit within the past 15 years.  Colorectal cancer screening. All adults should have this screening starting at age 31 and continuing until age 109. Your health care provider may recommend screening at age 72 if you are at increased risk. You will have tests every 1-10 years, depending on your results and the type of screening test.  Diabetes screening. This is done by checking your blood sugar (glucose) after you have not eaten for a while (fasting). You may have this done every 1-3 years.  Mammogram. This may be done every 1-2 years. Talk with your health care provider about how often you should have regular mammograms.  BRCA-related cancer screening. This may be done if you  have a family history of breast, ovarian, tubal, or peritoneal cancers. Other tests  Sexually transmitted disease (STD) testing.  Bone density scan. This is done to screen for osteoporosis. You may have this done starting at age 39. Follow these instructions at home: Eating and drinking  Eat a diet that includes fresh fruits and vegetables, whole  grains, lean protein, and low-fat dairy products. Limit your intake of foods with high amounts of sugar, saturated fats, and salt.  Take vitamin and mineral supplements as recommended by your health care provider.  Do not drink alcohol if your health care provider tells you not to drink.  If you drink alcohol: ? Limit how much you have to 0-1 drink a day. ? Be aware of how much alcohol is in your drink. In the [place] one drink equals one 12 oz bottle of beer (355 mL), one 5 oz glass of wine (148 mL), or one 1 oz glass of hard liquor (44 mL). Lifestyle  Take daily care of your teeth and gums.  Stay active. Exercise for at least 30 minutes on 5 or more days each week.  Do not use any products that contain nicotine or tobacco, such as cigarettes, e-cigarettes, and chewing tobacco. If you need help quitting, ask your health care provider.  If you are sexually active, practice safe sex. Use a condom or other form of protection in order to prevent STIs (sexually transmitted infections).  Talk with your health care provider about taking a low-dose aspirin or statin. What's next?  Go to your health care provider once a year for a well check visit.  Ask your health care provider how often you should have your eyes and teeth checked.  Stay up to date on all vaccines. This information is not intended to replace advice given to you by your health care provider. Make sure you discuss any questions you have with your health care provider. Document Revised: 03/24/2018 Document Reviewed: 03/24/2018 Elsevier Patient Education  2020 [employer].

## 2019-10-13 NOTE — Assessment & Plan Note (Signed)
At goal on no med.

## 2019-10-13 NOTE — Assessment & Plan Note (Signed)
Stop trazodone  ( SE and not effective)and try trial of amitriptyline.

## 2019-10-13 NOTE — Assessment & Plan Note (Signed)
Almost at goal on statin.

## 2019-10-31 ENCOUNTER — Inpatient Hospital Stay: Payer: PPO | Attending: Internal Medicine

## 2019-10-31 ENCOUNTER — Other Ambulatory Visit: Payer: Self-pay

## 2019-10-31 ENCOUNTER — Ambulatory Visit (HOSPITAL_COMMUNITY)
Admission: RE | Admit: 2019-10-31 | Discharge: 2019-10-31 | Disposition: A | Discharge status: Home / Self Care | Payer: PPO | Source: Ambulatory Visit | Attending: Internal Medicine | Admitting: Internal Medicine

## 2019-10-31 DIAGNOSIS — E119 Type 2 diabetes mellitus without complications: Secondary | ICD-10-CM | POA: Insufficient documentation

## 2019-10-31 DIAGNOSIS — C3431 Malignant neoplasm of lower lobe, right bronchus or lung: Secondary | ICD-10-CM | POA: Insufficient documentation

## 2019-10-31 DIAGNOSIS — Z79899 Other long term (current) drug therapy: Secondary | ICD-10-CM | POA: Diagnosis not present

## 2019-10-31 DIAGNOSIS — R0602 Shortness of breath: Secondary | ICD-10-CM | POA: Diagnosis not present

## 2019-10-31 DIAGNOSIS — J439 Emphysema, unspecified: Secondary | ICD-10-CM | POA: Diagnosis not present

## 2019-10-31 DIAGNOSIS — Z923 Personal history of irradiation: Secondary | ICD-10-CM | POA: Insufficient documentation

## 2019-10-31 DIAGNOSIS — Z9221 Personal history of antineoplastic chemotherapy: Secondary | ICD-10-CM | POA: Insufficient documentation

## 2019-10-31 DIAGNOSIS — I10 Essential (primary) hypertension: Secondary | ICD-10-CM | POA: Insufficient documentation

## 2019-10-31 DIAGNOSIS — C3490 Malignant neoplasm of unspecified part of unspecified bronchus or lung: Secondary | ICD-10-CM

## 2019-10-31 DIAGNOSIS — C679 Malignant neoplasm of bladder, unspecified: Secondary | ICD-10-CM | POA: Diagnosis not present

## 2019-10-31 DIAGNOSIS — Z87891 Personal history of nicotine dependence: Secondary | ICD-10-CM | POA: Insufficient documentation

## 2019-10-31 LAB — CBC WITH DIFFERENTIAL (CANCER CENTER ONLY)
Abs Immature Granulocytes: 0.01 10*3/uL (ref 0.00–0.07)
Basophils Absolute: 0 10*3/uL (ref 0.0–0.1)
Basophils Relative: 0 %
Eosinophils Absolute: 0.1 10*3/uL (ref 0.0–0.5)
Eosinophils Relative: 1 %
HCT: 41 % (ref 36.0–46.0)
Hemoglobin: 14 g/dL (ref 12.0–15.0)
Immature Granulocytes: 0 %
Lymphocytes Relative: 26 %
Lymphs Abs: 1.4 10*3/uL (ref 0.7–4.0)
MCH: 32 pg (ref 26.0–34.0)
MCHC: 34.1 g/dL (ref 30.0–36.0)
MCV: 93.6 fL (ref 80.0–100.0)
Monocytes Absolute: 0.6 10*3/uL (ref 0.1–1.0)
Monocytes Relative: 10 %
Neutro Abs: 3.3 10*3/uL (ref 1.7–7.7)
Neutrophils Relative %: 63 %
Platelet Count: 239 10*3/uL (ref 150–400)
RBC: 4.38 MIL/uL (ref 3.87–5.11)
RDW: 12.3 % (ref 11.5–15.5)
WBC Count: 5.4 10*3/uL (ref 4.0–10.5)
nRBC: 0 % (ref 0.0–0.2)

## 2019-10-31 LAB — CMP (CANCER CENTER ONLY)
ALT: 17 U/L (ref 0–44)
AST: 19 U/L (ref 15–41)
Albumin: 4.1 g/dL (ref 3.5–5.0)
Alkaline Phosphatase: 98 U/L (ref 38–126)
Anion gap: 9 (ref 5–15)
BUN: 12 mg/dL (ref 8–23)
CO2: 23 mmol/L (ref 22–32)
Calcium: 9.7 mg/dL (ref 8.9–10.3)
Chloride: 108 mmol/L (ref 98–111)
Creatinine: 0.84 mg/dL (ref 0.44–1.00)
GFR, Est AFR Am: >60 mL/min (ref >60)
GFR, Estimated: >60 mL/min (ref >60)
Glucose, Bld: 110 mg/dL — ABNORMAL HIGH (ref 70–99)
Potassium: 4.1 mmol/L (ref 3.5–5.1)
Sodium: 140 mmol/L (ref 135–145)
Total Bilirubin: 0.5 mg/dL (ref 0.3–1.2)
Total Protein: 7.2 g/dL (ref 6.5–8.1)

## 2019-10-31 MED ORDER — IOHEXOL 300 MG/ML  SOLN
75.0000 mL | Freq: Once | INTRAMUSCULAR | Status: AC | PRN
  Administered 2019-10-31: 75 mL via INTRAVENOUS

## 2019-10-31 MED ORDER — SODIUM CHLORIDE (PF) 0.9 % IJ SOLN
INTRAMUSCULAR | Status: AC
  Filled 2019-10-31: qty 50

## 2019-11-02 ENCOUNTER — Encounter: Payer: Self-pay | Admitting: Internal Medicine

## 2019-11-02 ENCOUNTER — Telehealth: Payer: Self-pay | Admitting: Internal Medicine

## 2019-11-02 ENCOUNTER — Other Ambulatory Visit: Payer: Self-pay

## 2019-11-02 ENCOUNTER — Inpatient Hospital Stay: Payer: PPO | Admitting: Internal Medicine

## 2019-11-02 VITALS — BP 122/71 | HR 106 | Temp 97.2°F | Resp 16 | Ht 59.5 in | Wt 136.1 lb

## 2019-11-02 DIAGNOSIS — C3490 Malignant neoplasm of unspecified part of unspecified bronchus or lung: Secondary | ICD-10-CM | POA: Diagnosis not present

## 2019-11-02 DIAGNOSIS — C3431 Malignant neoplasm of lower lobe, right bronchus or lung: Secondary | ICD-10-CM | POA: Diagnosis not present

## 2019-11-02 NOTE — Telephone Encounter (Signed)
Scheduled per 7/22 los. Printed avs and calendar for pt.  

## 2019-11-02 NOTE — Progress Notes (Signed)
[facility] [phone]   [phone]  OFFICE PROGRESS NOTE  [name], MD [place] [address]  DIAGNOSIS: Stage IIIA (T2a,N2, M0) non-small cell lung cancer,adenocarcinomapresenting with right lower lobe lung mass in addition to subcarinal lymphadenopathy and suspicious metastatic pulmonary nodules diagnosed in February 2019.  PRIOR THERAPY: 1) Concurrent chemoradiation with carboplatin for an AUC of 2 with paclitaxel 45 mg/m weekly. First dose given on 06/28/2017.  Status post 7 cycles.  Last dose was giving August 09, 2017 with partial response. 2) Consolidation immunotherapy with Imfinzi (Durvalumab) 10 mg/KG every 2 weeks, first dose 09/23/2017.  Status post 26 cycles. Last dose was given on Sep 08, 2018.  CURRENT THERAPY: Observation.  INTERVAL HISTORY: Katelyn Kennedy 74 y.o. female returns to the clinic today for follow-up visit.  The patient is feeling fine today with no concerning complaints.  She started treatment with amitriptyline for sleep but it is not helping.  She denied having any current chest pain, shortness of breath, cough or hemoptysis.  She denied having any fever or chills.  She has no nausea, vomiting, diarrhea or constipation.  She has no headache or visual changes.  She had repeat CT scan of the chest performed recently and she is here for evaluation and discussion of her risk her results.  MEDICAL HISTORY: Past Medical History:  Diagnosis Date  . Allergic rhinitis   . Arthritis   . Diabetes mellitus without complication (HCC)    diet control/no meds per pt  . Dyspnea   . Essential hypertension 04/11/2019  . History of chicken pox   . lung ca dx'd 04/2017  . Smoker     ALLERGIES:  is allergic to codeine.  MEDICATIONS:  Current Outpatient Medications  Medication Sig Dispense Refill  . acetaminophen (TYLENOL) 500 MG tablet Take 500 mg by mouth daily as needed for moderate pain or  headache.    Marland Kitchen amitriptyline (ELAVIL) 25 MG tablet Take 1 tablet (25 mg total) by mouth at bedtime. 30 tablet 3  . atorvastatin (LIPITOR) 20 MG tablet TAKE 1 TABLET(20 MG) BY MOUTH DAILY 90 tablet 1  . lisinopril (ZESTRIL) 2.5 MG tablet TAKE 1 TABLET(2.5 MG) BY MOUTH DAILY 90 tablet 0  . Multiple Vitamin (MULTIVITAMIN) capsule Take 1 capsule by mouth daily.      Vladimir Faster Glycol-Propyl Glycol (SYSTANE) 0.4-0.3 % SOLN Apply to eye as needed.      No current facility-administered medications for this visit.    SURGICAL HISTORY:  Past Surgical History:  Procedure Laterality Date  . ANKLE FRACTURE SURGERY Left 10 years ago   "hard to wake up from anesthesia" per pt  . [place]  . BREAST BIOPSY Right 1978   BENIGN CYST  . BREAST EXCISIONAL BIOPSY Left 2011   Benign  . BREAST EXCISIONAL BIOPSY Right 1985   Benign   . CATARACT EXTRACTION    . CATARACT EXTRACTION W/ INTRAOCULAR LENS IMPLANT Bilateral   . COLONOSCOPY  2011  . ELECTROMAGNETIC NAVIGATION BROCHOSCOPY N/A 06/08/2017   Procedure: ELECTROMAGNETIC NAVIGATION BRONCHOSCOPY;  Surgeon: Flora Lipps, MD;  Location: [facility] OR[facility];  Service: Cardiopulmonary;  Laterality: N/A;  . POLYPECTOMY  2011  . 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 TUBAL LIGATION  REVIEW OF SYSTEMS:  A comprehensive review of systems was negative.   PHYSICAL EXAMINATION: General appearance: alert, cooperative and no distress Head: Normocephalic, without obvious abnormality, atraumatic Neck: no adenopathy, no JVD, supple, symmetrical, trachea midline and thyroid not enlarged, symmetric, no tenderness/mass/nodules Lymph nodes: Cervical, supraclavicular, and axillary nodes normal. Resp: clear to auscultation bilaterally Back: symmetric, no  curvature. ROM normal. No CVA tenderness. Cardio: regular rate and rhythm, S1, S2 normal, no murmur, click, rub or gallop GI: soft, non-tender; bowel sounds normal; no masses,  no organomegaly Extremities: extremities normal, atraumatic, no cyanosis or edema  ECOG PERFORMANCE STATUS: 1 - Symptomatic but completely ambulatory  Blood pressure 122/71, pulse (!) 106, temperature (!) 97.2 F (36.2 C), temperature source Temporal, resp. rate 16, height 4' 11.5" (1.511 m), weight 136 lb 1.6 oz (61.7 kg), SpO2 97 %.  LABORATORY DATA: Lab Results  Component Value Date   WBC 5.4 10/31/2019   HGB 14.0 10/31/2019   HCT 41.0 10/31/2019   MCV 93.6 10/31/2019   PLT 239 10/31/2019      Chemistry      Component Value Date/Time   NA 140 10/31/2019 1007   K 4.1 10/31/2019 1007   CL 108 10/31/2019 1007   CO2 23 10/31/2019 1007   BUN 12 10/31/2019 1007   CREATININE 0.84 10/31/2019 1007      Component Value Date/Time   CALCIUM 9.7 10/31/2019 1007   ALKPHOS 98 10/31/2019 1007   AST 19 10/31/2019 1007   ALT 17 10/31/2019 1007   BILITOT 0.5 10/31/2019 1007       RADIOGRAPHIC STUDIES: CT Chest W Contrast  Result Date: 10/31/2019 CLINICAL DATA:  Primary Cancer Type: Lung Imaging Indication: Routine surveillance Interval therapy since last imaging? No Initial Cancer Diagnosis Date: 06/08/2017; Established by: Biopsy-proven Detailed Pathology: Stage IIIA non-small cell lung cancer, adenocarcinoma. Primary Tumor location: Right lower lobe. Surgeries: No thoracic. Chemotherapy: Yes; Ongoing? No; Most recent administration: 08/09/2017 Immunotherapy?  Yes; Type: Imfinzi; Ongoing? No Radiation therapy? Yes; Date Range: 06/28/2017 - 08/11/2017; Target: Right lung Other Cancers: Bladder cancer; TURBT 05/04/2017. EXAM: CT CHEST WITH CONTRAST TECHNIQUE: Multidetector CT imaging of the chest was performed during intravenous contrast administration. CONTRAST:  46mL OMNIPAQUE IOHEXOL 300 MG/ML  SOLN COMPARISON:   Most recent CT chest 06/30/2019.  05/26/2017 PET-CT. FINDINGS: Cardiovascular: Normal heart size. No significant pericardial effusion/thickening. Three-vessel coronary atherosclerosis. Atherosclerotic nonaneurysmal thoracic aorta. Normal caliber pulmonary arteries. No central pulmonary emboli. Mediastinum/Nodes: Hypodense bilateral thyroid nodules, largest 1.4 cm on the right, stable. Unremarkable esophagus. No pathologically enlarged axillary, mediastinal or hilar lymph nodes. Lungs/Pleura: No pneumothorax. No pleural effusion. Mild centrilobular emphysema. Solid basilar right lower lobe 7 mm pulmonary nodule (series 5/image 102) is stable since 09/01/2017 chest CT and considered benign. Sharply marginated lower right perihilar lung consolidation with associated bronchiectasis, volume loss and distortion, unchanged, compatible with radiation fibrosis. A few scattered indistinct sub solid pulmonary nodules, largest 9 mm in the apical right upper lobe (series 5/image 27), all stable. No new significant pulmonary nodules. Upper abdomen: Small hiatal hernia. Stable scattered hyperenhancing left liver foci (series 2/image 118 and image 110) and stable hypodense 1.4 cm segment 2 left liver lesion (series 2/image 114), presumably benign. Musculoskeletal: No aggressive appearing focal osseous lesions. Moderate thoracic spondylosis. IMPRESSION: 1. 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 2. No findings of metastatic disease in the chest. Stable scattered bilateral pulmonary nodules. 3. Aortic Atherosclerosis (ICD10-I70.0) and Emphysema (ICD10-J43.9). Electronically  Signed   By: Ilona Sorrel M.D.   On: 10/31/2019 12:47    ASSESSMENT AND PLAN: This is a very pleasant 74 year old white female with suspicious for stage IIIA non-small cell lung cancer, adenocarcinoma presented mainly with right lower lobe lung mass in addition to subcarinal lymphadenopathy but  there was suspicious metastatic pulmonary nodules. The patient completed a course of concurrent chemoradiation with weekly carboplatin and paclitaxel.  She tolerated her treatment well with no concerning complaints. The patient recently completed consolidation treatment with immunotherapy with Imfinzi (Durvalumab). She is status post 26 cycles. She tolerated it well without any adverse effects.  The patient is currently on observation and she is feeling fine today with no concerning complaints. She had repeat CT scan of the chest performed recently.  I personally and independently reviewed the scans and discussed the results with the patient today. Her scan showed no concerning findings for disease recurrence or metastasis. I recommended for her to continue on observation with repeat CT scan of the chest in 6 months. The patient was advised to call immediately if she has any concerning symptoms in the interval. All questions were answered. The patient knows to call the clinic with any problems, questions or concerns. We can certainly see the patient much sooner if necessary.  Disclaimer: This note was dictated with voice recognition software. Similar sounding words can inadvertently be transcribed and may not be corrected upon review.

## 2020-01-06 IMAGING — CT CT CHEST SUPER D W/O CM
2 of 4 series · 15 of 36 positions shown, 18 images · non-contrast
Comparison: 05/26/2017 PET-CT.

CLINICAL DATA: Hypermetabolic right lower lobe lung mass.
Preoperative for endobronchial biopsy. History of bladder cancer
status post TURBT 05/04/2017.

EXAM:
CT CHEST WITHOUT CONTRAST
TECHNIQUE: Multidetector CT imaging of the chest was performed using thin slice
collimation for electromagnetic bronchoscopy planning purposes,
without intravenous contrast.

[Series 5: super d · axial · 0.61mm/px · z∈[-298,-32]mm · 12 of 365 slices shown, 15 images]
[im 16/365  mediastinal]
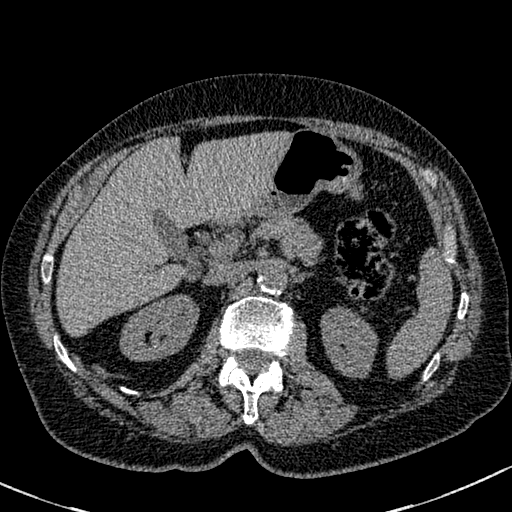
[im 16/365  lung]
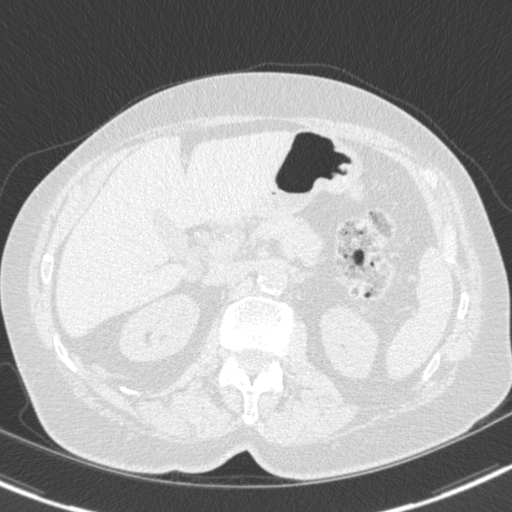
[im 48/365  lung]
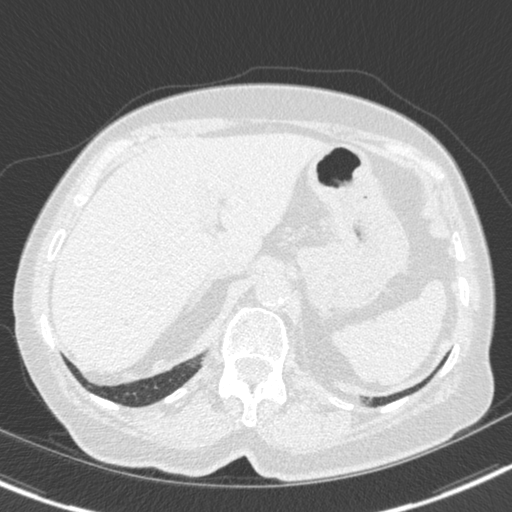
[im 80/365  lung]
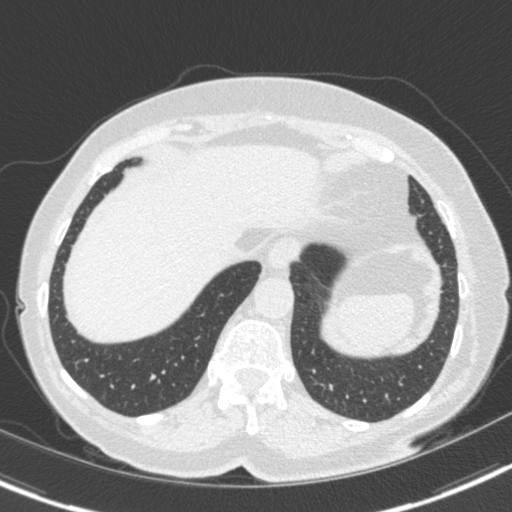
[im 111/365  lung]
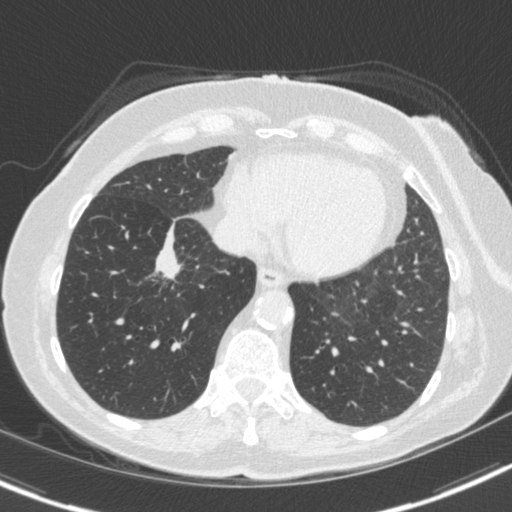
[im 143/365  mediastinal]
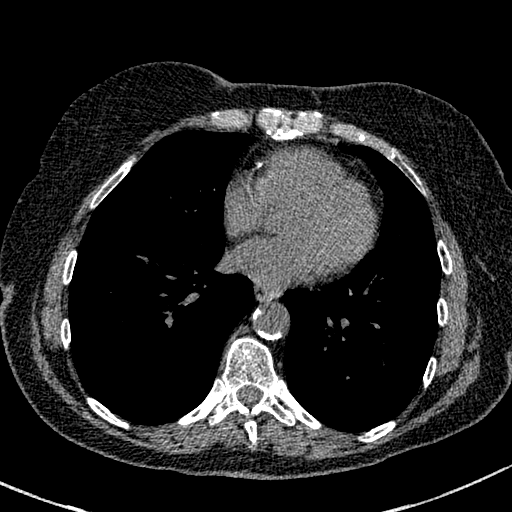
[im 143/365  lung]
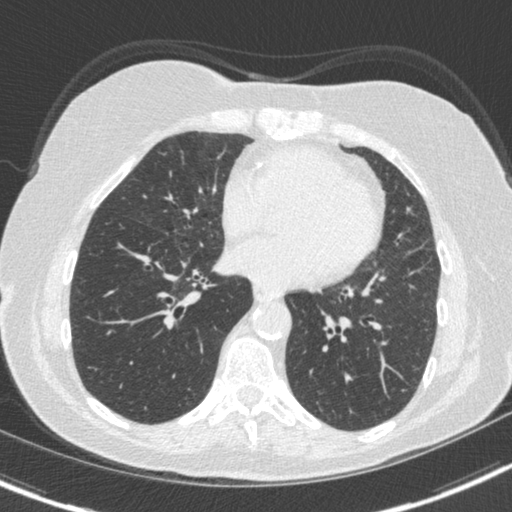
[im 175/365  lung]
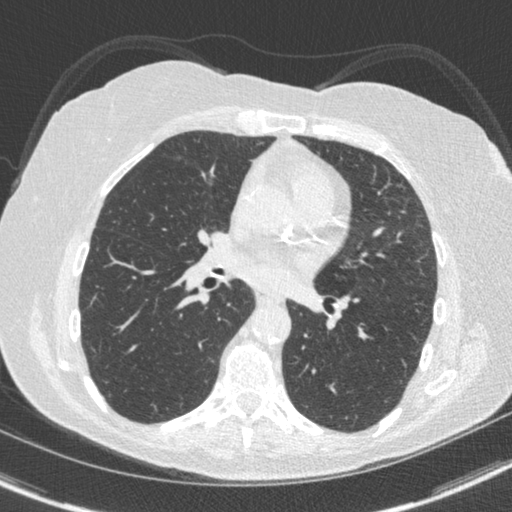
[im 190/365  lung]
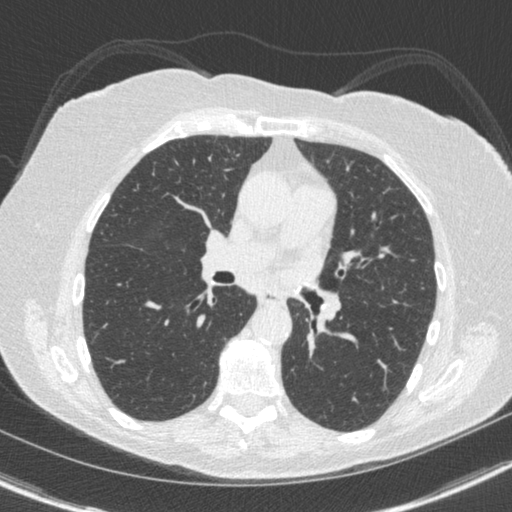
[im 222/365  lung]
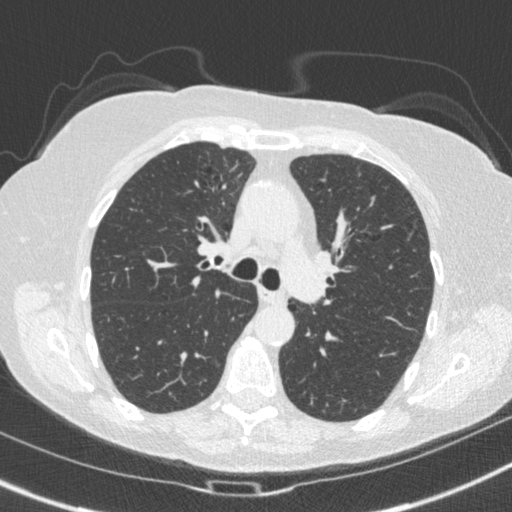
[im 254/365  mediastinal]
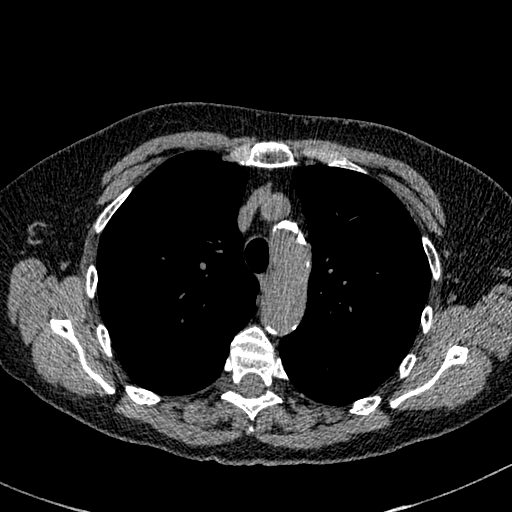
[im 254/365  lung]
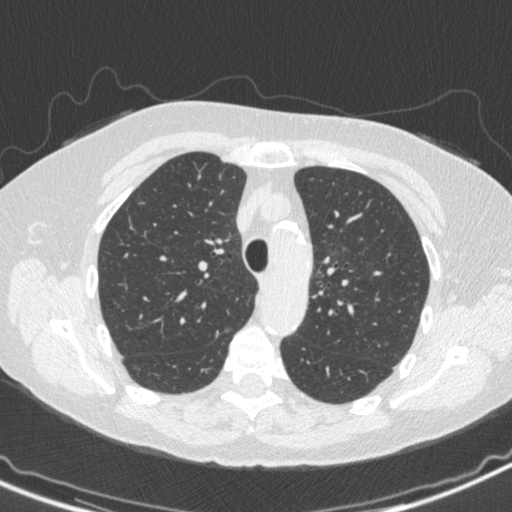
[im 285/365  lung]
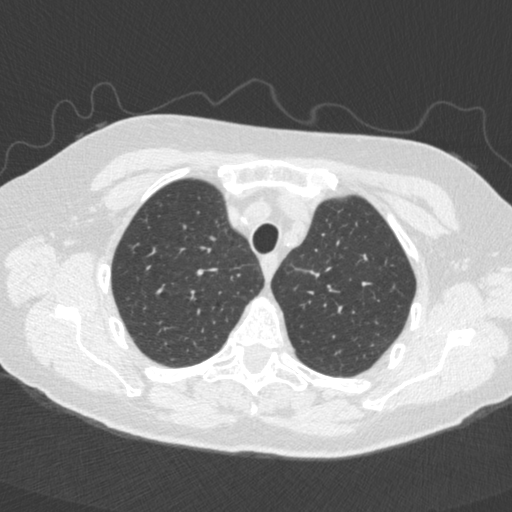
[im 317/365  lung]
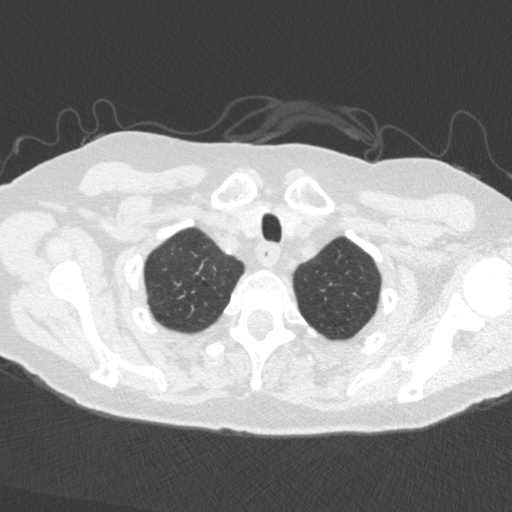
[im 349/365  lung]
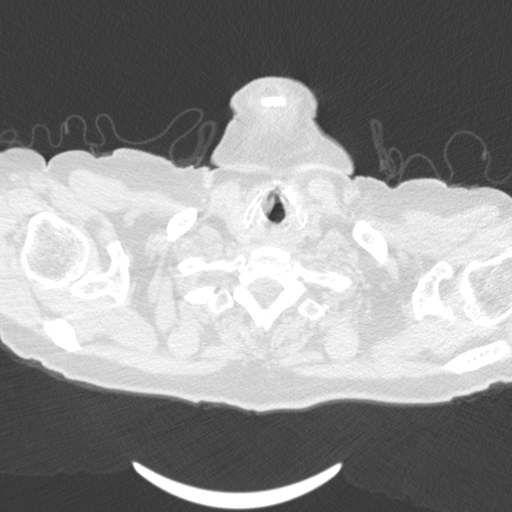

[Series 6: coronal · coronal · 0.61mm/px · 3 of 118 slices shown]
[im 24/118  lung]
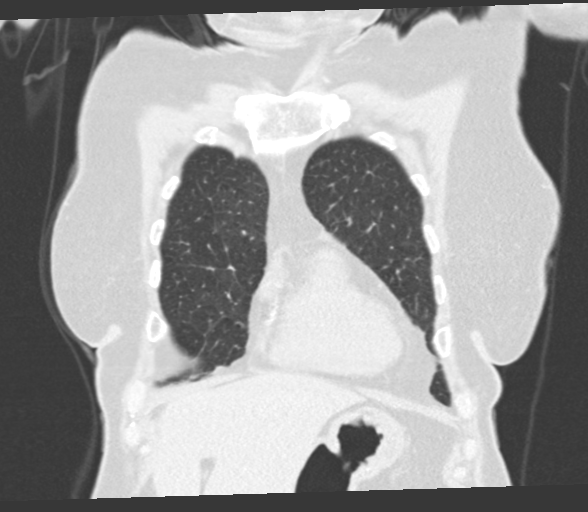
[im 47/118  lung]
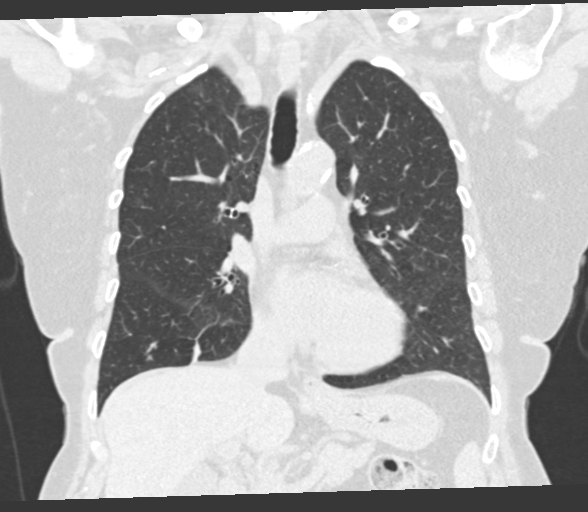
[im 71/118  lung]
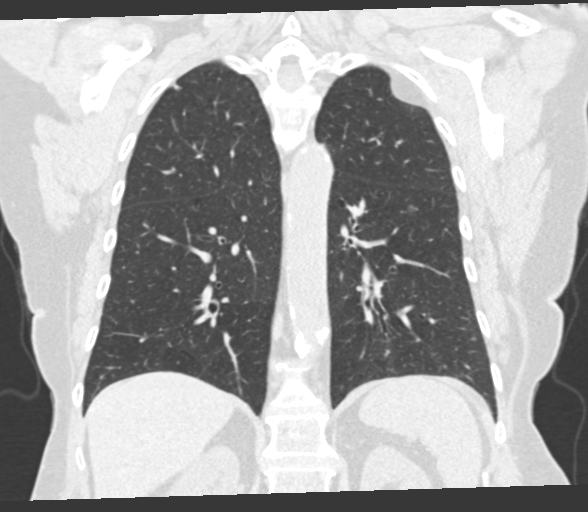

[15 of 36 positions shown; findings below may reference images not displayed]

FINDINGS: Cardiovascular: Normal heart size. No significant pericardial
fluid/thickening. Left main, left anterior descending and right
coronary atherosclerosis. Atherosclerotic nonaneurysmal thoracic
aorta. Normal caliber pulmonary arteries.

Mediastinum/Nodes: No discrete thyroid nodules. Unremarkable
esophagus. No axillary adenopathy. Stable mildly enlarged 1.0 cm
subcarinal node (series 2/image 29). No additional pathologically
enlarged mediastinal nodes. No discrete hilar adenopathy this
noncontrast scan.

Lungs/Pleura: No pneumothorax. No pleural effusion. Mild
centrilobular emphysema. Persistent anterior right lower lobe 3.4 x
1.9 cm lung mass (series 3/image 40). Stable scattered solid
pulmonary nodules in the right lung, largest 7 mm in the basilar
right lower lobe (series 3/image 47). No new significant pulmonary
nodules. No acute consolidative airspace disease.

Upper abdomen: Two scattered subcentimeter hypodense liver lesions
are too small to characterize and stable since 04/22/2017 CT.

Musculoskeletal: No aggressive appearing focal osseous lesions.
Moderate thoracic spondylosis.
IMPRESSION: 1. Persistent solid 3.4 cm anterior right lower lobe lung mass,
suspicious for primary bronchogenic carcinoma given hypermetabolism
on 05/26/2017 PET-CT.
2. Stable scattered small solid pulmonary nodules in the right lung,
largest 0.7 cm in the basilar right lower lobe. Pulmonary metastases
not excluded. Recommend attention on follow-up chest CT in 3-6
months.
3. Stable mild subcarinal lymphadenopathy, suspicious for nodal
metastasis given hypermetabolism on 05/26/2017 PET-CT.
4. Left main and two-vessel coronary atherosclerosis.

Aortic Atherosclerosis (GOW81-LMV.V) and Emphysema (GOW81-A9P.7).

## 2020-01-07 ENCOUNTER — Other Ambulatory Visit: Payer: Self-pay | Admitting: Family Medicine

## 2020-01-10 ENCOUNTER — Other Ambulatory Visit: Payer: Self-pay | Admitting: Family Medicine

## 2020-02-20 ENCOUNTER — Other Ambulatory Visit: Payer: Self-pay | Admitting: Family Medicine

## 2020-02-20 NOTE — Telephone Encounter (Signed)
Last office visit 10/13/2019 for  CPE.  Last refilled 10/13/2019 for #30 with 3 refills.  Next Appt 04/23/2020 for 6 month follow up.

## 2020-04-02 ENCOUNTER — Telehealth: Payer: Self-pay | Admitting: Family Medicine

## 2020-04-02 DIAGNOSIS — E119 Type 2 diabetes mellitus without complications: Secondary | ICD-10-CM

## 2020-04-02 DIAGNOSIS — E042 Nontoxic multinodular goiter: Secondary | ICD-10-CM

## 2020-04-02 NOTE — Telephone Encounter (Signed)
-----   Message from Cloyd Stagers, RT sent at 04/02/2020  2:24 PM EST ----- Regarding: Lab Orders for Tuesday 1.4.2022 Please place lab orders for Tuesday 1.4.2022, appt notes state "dm labs" Thank you, Dyke Maes RT(R)

## 2020-04-16 ENCOUNTER — Other Ambulatory Visit: Payer: PPO

## 2020-04-17 ENCOUNTER — Other Ambulatory Visit: Payer: Self-pay

## 2020-04-17 ENCOUNTER — Other Ambulatory Visit (INDEPENDENT_AMBULATORY_CARE_PROVIDER_SITE_OTHER): Payer: PPO

## 2020-04-17 DIAGNOSIS — E119 Type 2 diabetes mellitus without complications: Secondary | ICD-10-CM | POA: Diagnosis not present

## 2020-04-17 DIAGNOSIS — E042 Nontoxic multinodular goiter: Secondary | ICD-10-CM

## 2020-04-17 LAB — COMPREHENSIVE METABOLIC PANEL
ALT: 25 U/L (ref 0–35)
AST: 23 U/L (ref 0–37)
Albumin: 4.5 g/dL (ref 3.5–5.2)
Alkaline Phosphatase: 102 U/L (ref 39–117)
BUN: 15 mg/dL (ref 6–23)
CO2: 28 mEq/L (ref 19–32)
Calcium: 9.7 mg/dL (ref 8.4–10.5)
Chloride: 105 mEq/L (ref 96–112)
Creatinine, Ser: 0.77 mg/dL (ref 0.40–1.20)
GFR: 75.09 mL/min (ref >60.00)
Glucose, Bld: 108 mg/dL — ABNORMAL HIGH (ref 70–99)
Potassium: 4.2 mEq/L (ref 3.5–5.1)
Sodium: 140 mEq/L (ref 135–145)
Total Bilirubin: 0.6 mg/dL (ref 0.2–1.2)
Total Protein: 6.7 g/dL (ref 6.0–8.3)

## 2020-04-17 LAB — LIPID PANEL
Cholesterol: 190 mg/dL (ref 0–200)
HDL: 64.9 mg/dL (ref >39.00)
LDL Cholesterol: 88 mg/dL (ref 0–99)
NonHDL: 125.5
Total CHOL/HDL Ratio: 3
Triglycerides: 187 mg/dL — ABNORMAL HIGH (ref 0.0–149.0)
VLDL: 37.4 mg/dL (ref 0.0–40.0)

## 2020-04-17 LAB — HEMOGLOBIN A1C: Hgb A1c MFr Bld: 5.7 % (ref 4.6–6.5)

## 2020-04-17 LAB — TSH: TSH: 2.71 u[IU]/mL (ref 0.35–4.50)

## 2020-04-17 LAB — T3, FREE: T3, Free: 3 pg/mL (ref 2.3–4.2)

## 2020-04-17 LAB — T4, FREE: Free T4: 0.79 ng/dL (ref 0.60–1.60)

## 2020-04-18 NOTE — Progress Notes (Signed)
No critical labs need to be addressed urgently. We will discuss labs in detail at upcoming office visit.   

## 2020-04-23 ENCOUNTER — Ambulatory Visit: Payer: PPO | Admitting: Family Medicine

## 2020-04-23 ENCOUNTER — Other Ambulatory Visit: Payer: Self-pay

## 2020-04-30 ENCOUNTER — Encounter: Payer: Self-pay | Admitting: Family Medicine

## 2020-04-30 ENCOUNTER — Telehealth (INDEPENDENT_AMBULATORY_CARE_PROVIDER_SITE_OTHER): Payer: PPO | Admitting: Family Medicine

## 2020-04-30 DIAGNOSIS — R801 Persistent proteinuria, unspecified: Secondary | ICD-10-CM

## 2020-04-30 DIAGNOSIS — E119 Type 2 diabetes mellitus without complications: Secondary | ICD-10-CM

## 2020-04-30 DIAGNOSIS — E042 Nontoxic multinodular goiter: Secondary | ICD-10-CM | POA: Diagnosis not present

## 2020-04-30 DIAGNOSIS — E785 Hyperlipidemia, unspecified: Secondary | ICD-10-CM | POA: Diagnosis not present

## 2020-04-30 DIAGNOSIS — M79605 Pain in left leg: Secondary | ICD-10-CM

## 2020-04-30 DIAGNOSIS — E1169 Type 2 diabetes mellitus with other specified complication: Secondary | ICD-10-CM

## 2020-04-30 NOTE — Assessment & Plan Note (Signed)
Essential in prediabetes range on diet control for several years.

## 2020-04-30 NOTE — Assessment & Plan Note (Signed)
LDL at goal on lipitor 20 mg daily. No SE.

## 2020-04-30 NOTE — Assessment & Plan Note (Signed)
No further eval needed. Sable thyroid function.

## 2020-04-30 NOTE — Progress Notes (Addendum)
VIRTUAL VISIT Due to national recommendations of social distancing due to [place] 19, a virtual visit is felt to be most appropriate for this patient at this time.   Interactive audio and video telecommunications were attempted between this provider and patient, however failed, due to patient having technical difficulties OR patient did not have access to video capability.  We continued and completed visit with audio only.   I connected with the patient on 04/30/20 at 11:20 AM EST by virtual telehealth platform and verified that I am speaking with the correct person using two identifiers.   Interactive audio and video telecommunications were attempted between this provider and patient, however failed, due to patient having technical difficulties OR patient did not have access to video capability.  We continued and completed visit with audio only.   I discussed the limitations, risks, security and privacy concerns of performing an evaluation and management service by  virtual telehealth platform and the availability of in person appointments. I also discussed with the patient that there may be a patient responsible charge related to this service. The patient expressed understanding and agreed to proceed.  Patient location: Home Provider Location: [facility] Participants: Eliezer Lofts and [name]   Chief Complaint  Patient presents with  . Follow-up    History of Present Illness:  75 year old female presents for DM follow up.   S/P lung cancer treatment Chest CT on Friday this week.  Diabetes:   Diet controlled. Lab Results  Component Value Date   HGBA1C 5.7 04/17/2020  Using medications without difficulties: Hypoglycemic episodes: Hyperglycemic episodes: Feet problems: Blood Sugars averaging: not checking eye exam within last year:  Microalbuminuria: on lisinopril 2.5 mg dialy  Elevated Cholesterol: LDL at goal on lipitor 20 mg daily Lab Results  Component Value  Date   CHOL 190 04/17/2020   HDL 64.90 04/17/2020   LDLCALC 88 04/17/2020   LDLDIRECT 118.7 01/24/2013   TRIG 187.0 (H) 04/17/2020   CHOLHDL 3 04/17/2020  Using medications without problems:none Muscle aches:  none Diet compliance: heart healthy diet Exercise: leg peddler  daily Other complaints:  Multiple thyroid nodules, no further eval needed per Dr. Loanne Drilling Lab Results  Component Value Date   TSH 2.71 04/17/2020     She has been having some left leg pain.. burning/tingling/numbness on lateral leg. Notes more when standing and up on feet... resolves when sitting down.  Started initially with chemo/immunotherapy.  Woke her up at night.   She has been using foot peddler.  History of similar issue when pregnant.  mother with neuropathy.   Stable SOB, no CP. COVID 19 screen No recent travel or known exposure to COVID19 The patient denies respiratory symptoms of COVID 19 at this time.  The importance of social distancing was discussed today.   Review of Systems  Constitutional: Negative for chills and fever.  HENT: Negative for congestion and ear pain.   Eyes: Negative for pain and redness.  Respiratory: Negative for cough and shortness of breath.   Cardiovascular: Negative for chest pain, palpitations and leg swelling.  Gastrointestinal: Negative for abdominal pain, blood in stool, constipation, diarrhea, nausea and vomiting.  Genitourinary: Negative for dysuria.  Musculoskeletal: Negative for falls and myalgias.  Skin: Negative for rash.  Neurological: Positive for tingling. Negative for dizziness.  Psychiatric/Behavioral: Negative for depression. The patient is not nervous/anxious.       Past Medical History:  Diagnosis Date  . Allergic rhinitis   . Arthritis   .  Diabetes mellitus without complication (HCC)    diet control/no meds per pt  . Dyspnea   . Essential hypertension 04/11/2019  . History of chicken pox   . lung ca dx'd 04/2017  . Smoker     reports  that she quit smoking about 3 years ago. Her smoking use included cigarettes. She has a 20.00 pack-year smoking history. She has never used smokeless tobacco. She reports current alcohol use. She reports that she does not use drugs.   Current Outpatient Medications:  .  acetaminophen (TYLENOL) 500 MG tablet, Take 500 mg by mouth daily as needed for moderate pain or headache., Disp: , Rfl:  .  amitriptyline (ELAVIL) 25 MG tablet, TAKE 1 TABLET(25 MG) BY MOUTH AT BEDTIME, Disp: 30 tablet, Rfl: 11 .  atorvastatin (LIPITOR) 20 MG tablet, TAKE 1 TABLET(20 MG) BY MOUTH DAILY, Disp: 90 tablet, Rfl: 3 .  lisinopril (ZESTRIL) 2.5 MG tablet, TAKE 1 TABLET(2.5 MG) BY MOUTH DAILY, Disp: 90 tablet, Rfl: 1 .  Multiple Vitamin (MULTIVITAMIN) capsule, Take 1 capsule by mouth daily., Disp: , Rfl:  .  Polyethyl Glycol-Propyl Glycol 0.4-0.3 % SOLN, Apply to eye as needed. , Disp: , Rfl:    Observations/Objective: There were no vitals taken for this visit.  Physical Exam  Physical Exam Constitutional:      General: The patient is not in acute distress. Pulmonary:     Effort: Pulmonary effort is normal. No respiratory distress.  Neurological:     Mental Status: The patient is alert and oriented to person, place, and time.  Psychiatric:        Mood and Affect: Mood normal.        Behavior: Behavior normal.   Assessment and Plan Problem List Items Addressed This Visit    Diabetes mellitus with no complication ([place]) - Primary (Chronic)    Essential in prediabetes range on diet control for several years.      Hyperlipidemia associated with type 2 diabetes mellitus (HCC) (Chronic)    LDL at goal on lipitor 20 mg daily. No SE.      MICROALBUMINURIA (Chronic)    On ACEI      Multiple thyroid nodules (Chronic)    No further eval needed. Sable thyroid function.      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 No back pain. Start B12  supplement.. can consider testing at next lab visit.  Avoid leaning/lying on left side.  Increase exercise.  If worsening consider gabapentin to treat or nerve conduction to eval further.           I discussed the assessment and treatment plan with the patient. The patient was provided an opportunity to ask questions and all were answered. The patient agreed with the plan and demonstrated an understanding of the instructions.   The patient was advised to call back or seek an in-person evaluation if the symptoms worsen or if the condition fails to improve as anticipated.   Total visit time 20 minutes, > 50% spent counseling and cordinating patients care.   Eliezer Lofts, MD

## 2020-04-30 NOTE — Patient Instructions (Signed)
Increase exercise, avoid lying on left side.. start b12 100 mcg daily.

## 2020-04-30 NOTE — Assessment & Plan Note (Signed)
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 No back pain. Start B12 supplement.. can consider testing at next lab visit.  Avoid leaning/lying on left side.  Increase exercise.  If worsening consider gabapentin to treat or nerve conduction to eval further.

## 2020-04-30 NOTE — Assessment & Plan Note (Signed)
On ACE-I 

## 2020-05-03 ENCOUNTER — Inpatient Hospital Stay: Payer: PPO

## 2020-05-03 ENCOUNTER — Ambulatory Visit (HOSPITAL_COMMUNITY): Payer: PPO

## 2020-05-06 ENCOUNTER — Inpatient Hospital Stay: Payer: PPO | Admitting: Internal Medicine

## 2020-05-13 ENCOUNTER — Inpatient Hospital Stay: Payer: PPO | Attending: Internal Medicine

## 2020-05-13 ENCOUNTER — Encounter (HOSPITAL_COMMUNITY): Payer: Self-pay

## 2020-05-13 ENCOUNTER — Other Ambulatory Visit: Payer: Self-pay

## 2020-05-13 ENCOUNTER — Ambulatory Visit (HOSPITAL_COMMUNITY)
Admission: RE | Admit: 2020-05-13 | Discharge: 2020-05-13 | Disposition: A | Discharge status: Home / Self Care | Payer: PPO | Source: Ambulatory Visit | Attending: Internal Medicine | Admitting: Internal Medicine

## 2020-05-13 DIAGNOSIS — C3490 Malignant neoplasm of unspecified part of unspecified bronchus or lung: Secondary | ICD-10-CM | POA: Insufficient documentation

## 2020-05-13 DIAGNOSIS — C3431 Malignant neoplasm of lower lobe, right bronchus or lung: Secondary | ICD-10-CM | POA: Insufficient documentation

## 2020-05-13 DIAGNOSIS — J8410 Pulmonary fibrosis, unspecified: Secondary | ICD-10-CM | POA: Diagnosis not present

## 2020-05-13 DIAGNOSIS — J181 Lobar pneumonia, unspecified organism: Secondary | ICD-10-CM | POA: Diagnosis not present

## 2020-05-13 DIAGNOSIS — I2510 Atherosclerotic heart disease of native coronary artery without angina pectoris: Secondary | ICD-10-CM | POA: Diagnosis not present

## 2020-05-13 LAB — CBC WITH DIFFERENTIAL (CANCER CENTER ONLY)
Abs Immature Granulocytes: 0.03 10*3/uL (ref 0.00–0.07)
Basophils Absolute: 0 10*3/uL (ref 0.0–0.1)
Basophils Relative: 1 %
Eosinophils Absolute: 0.1 10*3/uL (ref 0.0–0.5)
Eosinophils Relative: 1 %
HCT: 43.5 % (ref 36.0–46.0)
Hemoglobin: 14.4 g/dL (ref 12.0–15.0)
Immature Granulocytes: 0 %
Lymphocytes Relative: 32 %
Lymphs Abs: 2.2 10*3/uL (ref 0.7–4.0)
MCH: 30.8 pg (ref 26.0–34.0)
MCHC: 33.1 g/dL (ref 30.0–36.0)
MCV: 93.1 fL (ref 80.0–100.0)
Monocytes Absolute: 0.7 10*3/uL (ref 0.1–1.0)
Monocytes Relative: 10 %
Neutro Abs: 3.9 10*3/uL (ref 1.7–7.7)
Neutrophils Relative %: 56 %
Platelet Count: 271 10*3/uL (ref 150–400)
RBC: 4.67 MIL/uL (ref 3.87–5.11)
RDW: 12.8 % (ref 11.5–15.5)
WBC Count: 6.9 10*3/uL (ref 4.0–10.5)
nRBC: 0 % (ref 0.0–0.2)

## 2020-05-13 LAB — CMP (CANCER CENTER ONLY)
ALT: 24 U/L (ref 0–44)
AST: 22 U/L (ref 15–41)
Albumin: 4.4 g/dL (ref 3.5–5.0)
Alkaline Phosphatase: 104 U/L (ref 38–126)
Anion gap: 11 (ref 5–15)
BUN: 15 mg/dL (ref 8–23)
CO2: 26 mmol/L (ref 22–32)
Calcium: 10.6 mg/dL — ABNORMAL HIGH (ref 8.9–10.3)
Chloride: 105 mmol/L (ref 98–111)
Creatinine: 0.9 mg/dL (ref 0.44–1.00)
GFR, Estimated: >60 mL/min (ref >60)
Glucose, Bld: 105 mg/dL — ABNORMAL HIGH (ref 70–99)
Potassium: 4.3 mmol/L (ref 3.5–5.1)
Sodium: 142 mmol/L (ref 135–145)
Total Bilirubin: 0.5 mg/dL (ref 0.3–1.2)
Total Protein: 7.6 g/dL (ref 6.5–8.1)

## 2020-05-13 MED ORDER — IOHEXOL 300 MG/ML  SOLN
75.0000 mL | Freq: Once | INTRAMUSCULAR | Status: AC | PRN
  Administered 2020-05-13: 75 mL via INTRAVENOUS

## 2020-05-14 ENCOUNTER — Inpatient Hospital Stay: Payer: PPO | Attending: Internal Medicine | Admitting: Internal Medicine

## 2020-05-14 ENCOUNTER — Encounter: Payer: Self-pay | Admitting: Internal Medicine

## 2020-05-14 ENCOUNTER — Other Ambulatory Visit: Payer: Self-pay

## 2020-05-14 VITALS — BP 136/72 | HR 115 | Temp 99.0°F | Resp 17 | Ht 59.5 in | Wt 139.3 lb

## 2020-05-14 DIAGNOSIS — Z9221 Personal history of antineoplastic chemotherapy: Secondary | ICD-10-CM | POA: Insufficient documentation

## 2020-05-14 DIAGNOSIS — Z85118 Personal history of other malignant neoplasm of bronchus and lung: Secondary | ICD-10-CM | POA: Insufficient documentation

## 2020-05-14 DIAGNOSIS — K449 Diaphragmatic hernia without obstruction or gangrene: Secondary | ICD-10-CM | POA: Diagnosis not present

## 2020-05-14 DIAGNOSIS — R0602 Shortness of breath: Secondary | ICD-10-CM | POA: Insufficient documentation

## 2020-05-14 DIAGNOSIS — Z923 Personal history of irradiation: Secondary | ICD-10-CM | POA: Insufficient documentation

## 2020-05-14 DIAGNOSIS — C3431 Malignant neoplasm of lower lobe, right bronchus or lung: Secondary | ICD-10-CM | POA: Diagnosis present

## 2020-05-14 DIAGNOSIS — I10 Essential (primary) hypertension: Secondary | ICD-10-CM | POA: Diagnosis not present

## 2020-05-14 DIAGNOSIS — Z79899 Other long term (current) drug therapy: Secondary | ICD-10-CM | POA: Diagnosis not present

## 2020-05-14 DIAGNOSIS — F17210 Nicotine dependence, cigarettes, uncomplicated: Secondary | ICD-10-CM | POA: Diagnosis not present

## 2020-05-14 DIAGNOSIS — J439 Emphysema, unspecified: Secondary | ICD-10-CM | POA: Diagnosis not present

## 2020-05-14 DIAGNOSIS — I2510 Atherosclerotic heart disease of native coronary artery without angina pectoris: Secondary | ICD-10-CM | POA: Diagnosis not present

## 2020-05-14 DIAGNOSIS — C3490 Malignant neoplasm of unspecified part of unspecified bronchus or lung: Secondary | ICD-10-CM | POA: Diagnosis not present

## 2020-05-14 DIAGNOSIS — E119 Type 2 diabetes mellitus without complications: Secondary | ICD-10-CM | POA: Insufficient documentation

## 2020-05-14 DIAGNOSIS — M129 Arthropathy, unspecified: Secondary | ICD-10-CM | POA: Insufficient documentation

## 2020-05-14 DIAGNOSIS — I700 Atherosclerosis of aorta: Secondary | ICD-10-CM | POA: Insufficient documentation

## 2020-05-14 NOTE — Progress Notes (Signed)
[place] Telephone:(336) [phone]   Fax:(336) [phone]  OFFICE PROGRESS NOTE  [name], MD [place] [address]  DIAGNOSIS: Stage IIIA (T2a,N2, M0) non-small cell lung cancer,adenocarcinomapresenting with right lower lobe lung mass in addition to subcarinal lymphadenopathy and suspicious metastatic pulmonary nodules diagnosed in February 2019.  PRIOR THERAPY: 1) Concurrent chemoradiation with carboplatin for an AUC of 2 with paclitaxel 45 mg/m weekly. First dose given on 06/28/2017.  Status post 7 cycles.  Last dose was giving August 09, 2017 with partial response. 2) Consolidation immunotherapy with Imfinzi (Durvalumab) 10 mg/KG every 2 weeks, first dose 09/23/2017.  Status post 26 cycles. Last dose was given on Sep 08, 2018.  CURRENT THERAPY: Observation.  INTERVAL HISTORY: Katelyn Kennedy 75 y.o. female returns to the clinic today for follow-up visit.  The patient is feeling fine today with no concerning complaints except for mild numbness on the lateral side of her left thigh.  She denied having any chest pain, shortness of breath, cough or hemoptysis.  She denied having any fever or chills.  She has no nausea, vomiting, diarrhea or constipation.  She has no headache or visual changes.  The patient had repeat CT scan of the chest performed recently and she is here for evaluation and discussion of her risk her results.  MEDICAL HISTORY: Past Medical History:  Diagnosis Date  . Allergic rhinitis   . Arthritis   . Diabetes mellitus without complication (HCC)    diet control/no meds per pt  . Dyspnea   . Essential hypertension 04/11/2019  . History of chicken pox   . lung ca dx'd 04/2017  . Smoker     ALLERGIES:  is allergic to codeine.  MEDICATIONS:  Current Outpatient Medications  Medication Sig Dispense Refill  . acetaminophen (TYLENOL) 500 MG tablet Take 500 mg by mouth daily as needed for moderate pain or headache.    Marland Kitchen  amitriptyline (ELAVIL) 25 MG tablet TAKE 1 TABLET(25 MG) BY MOUTH AT BEDTIME 30 tablet 11  . atorvastatin (LIPITOR) 20 MG tablet TAKE 1 TABLET(20 MG) BY MOUTH DAILY 90 tablet 3  . lisinopril (ZESTRIL) 2.5 MG tablet TAKE 1 TABLET(2.5 MG) BY MOUTH DAILY 90 tablet 1  . Multiple Vitamin (MULTIVITAMIN) capsule Take 1 capsule by mouth daily.    Vladimir Faster Glycol-Propyl Glycol 0.4-0.3 % SOLN Apply to eye as needed.      No current facility-administered medications for this visit.    SURGICAL HISTORY:  Past Surgical History:  Procedure Laterality Date  . ANKLE FRACTURE SURGERY Left 10 years ago   "hard to wake up from anesthesia" per pt  . [place]  . BREAST BIOPSY Right 1978   BENIGN CYST  . BREAST EXCISIONAL BIOPSY Left 2011   Benign  . BREAST EXCISIONAL BIOPSY Right 1985   Benign   . CATARACT EXTRACTION    . CATARACT EXTRACTION W/ INTRAOCULAR LENS IMPLANT Bilateral   . COLONOSCOPY  2011  . ELECTROMAGNETIC NAVIGATION BROCHOSCOPY N/A 06/08/2017   Procedure: ELECTROMAGNETIC NAVIGATION BRONCHOSCOPY;  Surgeon: Flora Lipps, MD;  Location: [facility] ORS;  Service: Cardiopulmonary;  Laterality: N/A;  . POLYPECTOMY  2011  . 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 TUBAL LIGATION      REVIEW OF SYSTEMS:  A comprehensive review of systems was negative.   PHYSICAL EXAMINATION: General appearance: alert, cooperative and no distress Head: Normocephalic, without obvious abnormality, atraumatic Neck: no adenopathy, no JVD, supple, symmetrical, trachea midline and thyroid not enlarged, symmetric, no tenderness/mass/nodules Lymph nodes: Cervical, supraclavicular, and axillary nodes normal. Resp: clear to auscultation bilaterally Back: symmetric, no curvature. ROM normal. No CVA  tenderness. Cardio: regular rate and rhythm, S1, S2 normal, no murmur, click, rub or gallop GI: soft, non-tender; bowel sounds normal; no masses,  no organomegaly Extremities: extremities normal, atraumatic, no cyanosis or edema  ECOG PERFORMANCE STATUS: 1 - Symptomatic but completely ambulatory  Blood pressure 136/72, pulse (!) 115, temperature 99 F (37.2 C), resp. rate 17, height 4' 11.5" (1.511 m), weight 139 lb 4.8 oz (63.2 kg), SpO2 100 %.  LABORATORY DATA: Lab Results  Component Value Date   WBC 6.9 05/13/2020   HGB 14.4 05/13/2020   HCT 43.5 05/13/2020   MCV 93.1 05/13/2020   PLT 271 05/13/2020      Chemistry      Component Value Date/Time   NA 142 05/13/2020 1351   K 4.3 05/13/2020 1351   CL 105 05/13/2020 1351   CO2 26 05/13/2020 1351   BUN 15 05/13/2020 1351   CREATININE 0.90 05/13/2020 1351      Component Value Date/Time   CALCIUM 10.6 (H) 05/13/2020 1351   ALKPHOS 104 05/13/2020 1351   AST 22 05/13/2020 1351   ALT 24 05/13/2020 1351   BILITOT 0.5 05/13/2020 1351       RADIOGRAPHIC STUDIES: CT Chest W Contrast  Result Date: 05/13/2020 CLINICAL DATA:  Lung cancer station EXAM: CT CHEST WITH CONTRAST TECHNIQUE: Multidetector CT imaging of the chest was performed during intravenous contrast administration. CONTRAST:  57mL OMNIPAQUE IOHEXOL 300 MG/ML  SOLN COMPARISON:  CT chest, 10/31/2019, 06/30/2019, thyroid ultrasound, 04/26/2019 FINDINGS: Cardiovascular: Aortic atherosclerosis. Normal heart size. Three-vessel coronary artery calcifications. No pericardial effusion. Mediastinum/Nodes: No enlarged mediastinal, hilar, or axillary lymph nodes. Small hiatal hernia. Multiple redemonstrated low-attenuation thyroid nodules bilaterally, previously assessed by dedicated thyroid ultrasound. This has been evaluated on previous imaging. (ref: J Am Coll Radiol. 2015 Feb;12(2): 143-50). Trachea, and esophagus demonstrate no significant findings. Lungs/Pleura: Unchanged post  treatment appearance of the right lung with post radiation perihilar and paramedian consolidation and fibrosis. Mild centrilobular emphysema. Stable ground-glass nodule in the anterior right pulmonary apex measuring 8 mm (series 7, image 30). Stable 7 mm rounded pulmonary nodule in the dependent right lower lobe (series 7, image 99). Background of tiny centrilobular pulmonary nodules, most concentrated in the lung apices, consistent with smoking-related respiratory bronchiolitis. No pleural effusion or pneumothorax. Upper Abdomen: No acute abnormality. Unchanged hyperenhancing lesions of the anterior right lobe of the liver (series 2, image 119) and left lobe of the liver (series 2, image 113). Musculoskeletal: No chest wall mass or suspicious bone lesions identified. IMPRESSION: 1. Unchanged post treatment appearance of the right lung with post radiation perihilar and paramedian consolidation and fibrosis. No evidence of malignant recurrence. 2. Stable small solid and ground-glass pulmonary nodules. Attention on follow-up. 3. 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 Attention on follow-up. 4. Emphysema. 5. Coronary artery disease. Aortic Atherosclerosis (ICD10-I70.0) and Emphysema (ICD10-J43.9). Electronically Signed   By: Eddie Candle M.D.   On: 05/13/2020 15:49    ASSESSMENT AND PLAN: This is a very pleasant 75 year old white female with suspicious for  stage IIIA non-small cell lung cancer, adenocarcinoma presented mainly with right lower lobe lung mass in addition to subcarinal lymphadenopathy but there was suspicious metastatic pulmonary nodules. The patient completed a course of concurrent chemoradiation with weekly carboplatin and paclitaxel.  She tolerated her treatment well with no concerning complaints. The patient recently completed consolidation treatment with immunotherapy with Imfinzi  (Durvalumab). She is status post 26 cycles. She tolerated it well without any adverse effects.  The patient is currently on observation and she is feeling fine today with no concerning complaints. She had repeat CT scan of the chest performed recently.  I personally and independently reviewed the scans and discussed the results with the patient today. Her scan showed no concerning findings for disease recurrence or metastasis. I recommended for her to continue on observation with repeat CT scan of the chest in 6 months. The patient was advised to call immediately if she has any concerning symptoms in the interval. All questions were answered. The patient knows to call the clinic with any problems, questions or concerns. We can certainly see the patient much sooner if necessary.  Disclaimer: This note was dictated with voice recognition software. Similar sounding words can inadvertently be transcribed and may not be corrected upon review.

## 2020-06-05 ENCOUNTER — Telehealth: Payer: Self-pay

## 2020-06-05 NOTE — Chronic Care Management (AMB) (Addendum)
Chronic Care Management Pharmacy Assistant   Name: Katelyn Kennedy  MRN: [id number] DOB: [birth year]  Reason for Encounter: Disease State- Diabetes  Patient Questions:  1.  Have you seen any other providers since your last visit? Yes 05/14/20- Dr. Curt Bears- Oncology 04/30/20- [name]- PCP 11/02/19- Dr. Curt Bears- Oncology 10/13/19- [name] - PCP- Started amitriptyline.   PCP : [name], MD  Allergies:   Allergies  Allergen Reactions   Codeine Nausea Only    Medications: Outpatient Encounter Medications as of 06/05/2020  Medication Sig   acetaminophen (TYLENOL) 500 MG tablet Take 500 mg by mouth daily as needed for moderate pain or headache.   amitriptyline (ELAVIL) 25 MG tablet TAKE 1 TABLET(25 MG) BY MOUTH AT BEDTIME   atorvastatin (LIPITOR) 20 MG tablet TAKE 1 TABLET(20 MG) BY MOUTH DAILY   lisinopril (ZESTRIL) 2.5 MG tablet TAKE 1 TABLET(2.5 MG) BY MOUTH DAILY   Multiple Vitamin (MULTIVITAMIN) capsule Take 1 capsule by mouth daily.   Polyethyl Glycol-Propyl Glycol 0.4-0.3 % SOLN Apply to eye as needed.    No facility-administered encounter medications on file as of 06/05/2020.    Current Diagnosis: Patient Active Problem List   Diagnosis Date Noted   Pain in lateral left lower extremity 04/30/2020   Multiple thyroid nodules 04/03/2019   History of bladder cancer 09/30/2018   Encounter for antineoplastic immunotherapy 09/13/2017   Chronic insomnia 08/17/2017   Encounter for antineoplastic chemotherapy 06/17/2017   Goals of care, counseling/discussion 06/17/2017   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 ([place]) 04/29/2017   Counseling regarding end of life decision making 07/31/2014   Eczema 07/20/2011   MICROALBUMINURIA 03/25/2010   Osteoporosis 10/08/2009   Diabetes mellitus with no complication ([place]) 19/37/9024   Hyperlipidemia associated with type 2 diabetes mellitus ([place]) 09/03/2009   Allergic rhinitis 09/03/2009    ARTHRITIS 09/03/2009   CHICKENPOX, HX OF 09/03/2009   Recent Relevant Labs: Lab Results  Component Value Date/Time   HGBA1C 5.7 04/17/2020 10:39 AM   HGBA1C 5.5 10/10/2019 10:56 AM   MICROALBUR <0.7 04/04/2019 11:05 AM   MICROALBUR 0.3 07/21/2013 12:14 PM    Kidney Function Lab Results  Component Value Date/Time   CREATININE 0.90 05/13/2020 01:51 PM   CREATININE 0.77 04/17/2020 10:39 AM   CREATININE 0.84 10/31/2019 10:07 AM   GFR 75.09 04/17/2020 10:39 AM   GFRNONAA >60 05/13/2020 01:51 PM   GFRAA >60 10/31/2019 10:07 AM    Current antihyperglycemic regimen:  No pharmacotherapy  What recent interventions/DTPs have been made to improve glycemic control:  No recent interventions  Have there been any recent hospitalizations or ED visits since last visit with CPP? No   Patient denies hypoglycemic symptoms, including Pale, Sweaty, Shaky, Hungry, Nervous/irritable and Vision changes   Patient denies hyperglycemic symptoms, including blurry vision, excessive thirst, fatigue, polyuria and weakness   How often are you checking your blood sugar? Patient states she does not check her blood sugars.   Are you checking your feet daily/regularly? Yes. States feet look ok.   Adherence Review: Is the patient currently on a STATIN medication? Yes Is the patient currently on ACE/ARB medication? Yes  Patient notes at a recent visit with Dr. Julien Nordmann her pulse was elevated. She was concerned about this but states Dr. Julien Nordmann assured her it was ok and previous pulse rates were ok. Attempted to schedule appointment with [name] but patient requests an appointment only if [name] or [name] feel her pulse  is concerning.  Follow-Up:  Pharmacist Review  [name], CPP notified  [name], [place] Assistant [phone]  I have reviewed the care management and care coordination activities outlined in this encounter and I am certifying that I agree with  the content of this note. No further action required.  [name], PharmD Clinical Pharmacist [facility] at [facility] [phone]

## 2020-07-15 ENCOUNTER — Other Ambulatory Visit: Payer: Self-pay | Admitting: Family Medicine

## 2020-07-31 ENCOUNTER — Other Ambulatory Visit: Payer: Self-pay | Admitting: Family Medicine

## 2020-07-31 DIAGNOSIS — Z1231 Encounter for screening mammogram for malignant neoplasm of breast: Secondary | ICD-10-CM

## 2020-08-14 ENCOUNTER — Telehealth: Payer: Self-pay

## 2020-08-14 NOTE — Chronic Care Management (AMB) (Addendum)
    Chronic Care Management Pharmacy Assistant   Name: MEYER DOCKERY  MRN: [id number] DOB: Aug 23, 1945  Reason for Encounter: Reminder Call for CCM appointment   Recent office visits:  None since last CCM contact  Recent consult visits:  None since lat CCM contact  Hospital visits:  None in previous 6 months  Medications: Outpatient Encounter Medications as of 08/14/2020  Medication Sig   acetaminophen (TYLENOL) 500 MG tablet Take 500 mg by mouth daily as needed for moderate pain or headache.   amitriptyline (ELAVIL) 25 MG tablet TAKE 1 TABLET(25 MG) BY MOUTH AT BEDTIME   atorvastatin (LIPITOR) 20 MG tablet TAKE 1 TABLET(20 MG) BY MOUTH DAILY   lisinopril (ZESTRIL) 2.5 MG tablet TAKE 1 TABLET(2.5 MG) BY MOUTH DAILY   Multiple Vitamin (MULTIVITAMIN) capsule Take 1 capsule by mouth daily.   Polyethyl Glycol-Propyl Glycol 0.4-0.3 % SOLN Apply to eye as needed.    No facility-administered encounter medications on file as of 08/14/2020.   l KAYBREE WILLIAMS was contacted to remind her of her upcoming telephone visit with [name] on 08/20/2020 at 10:00am. Patient did not answer. Left message for patient to have all medications, supplements and any blood glucose and blood pressure readings available for review at appointment.  Star Rating Drugs: Medication:  Last Fill: Day Supply  Lisinopril 2.5 mg. 04/09/2020          90ds  [name], CPP notified  Avel Sensor, [place] Assistant [phone]  I have reviewed the care management and care coordination activities outlined in this encounter and I am certifying that I agree with the content of this note. No further action required.  [name], PharmD Clinical Pharmacist [facility] at [facility] [phone]

## 2020-08-15 ENCOUNTER — Ambulatory Visit: Payer: PPO | Admitting: Urology

## 2020-08-15 ENCOUNTER — Encounter: Payer: Self-pay | Admitting: Urology

## 2020-08-15 ENCOUNTER — Other Ambulatory Visit: Payer: Self-pay

## 2020-08-15 VITALS — BP 111/72 | HR 123 | Ht 60.0 in | Wt 139.0 lb

## 2020-08-15 DIAGNOSIS — Z8551 Personal history of malignant neoplasm of bladder: Secondary | ICD-10-CM | POA: Diagnosis not present

## 2020-08-15 NOTE — Progress Notes (Signed)
   08/15/20  CC:  Chief Complaint  Patient presents with  . Cysto   Urologic history:  Ta urothelial carcinoma bladder  TURBT 1 cm papillary tumor 05/04/2017  Path noninvasive papillary urothelial carcinoma, low-grade  Received post resection intravesical gemcitabine  No recurrences  HPI: 75 y.o. female presents today for annual surveillance cystoscopy.  She has no complaints  There were no vitals taken for this visit. NED. A&Ox3.   No respiratory distress   Abd soft, NT, ND Atrophic external genitalia with patent urethral meatus  Cystoscopy Procedure Note  Patient identification was confirmed, informed consent was obtained, and patient was prepped using Betadine solution.  Lidocaine jelly was administered per urethral meatus.    Procedure: - Flexible cystoscope introduced, without any difficulty.   - Thorough search of the bladder revealed:    normal urethral meatus    normal urothelium    no stones    no ulcers     no tumors    no urethral polyps    no trabeculation  - Ureteral orifices were normal in position and appearance.  Post-Procedure: - Patient tolerated the procedure well  Assessment/ Plan:  No evidence recurrent bladder tumor  Surveillance cystoscopy 1 year   Abbie Sons, MD

## 2020-08-16 ENCOUNTER — Other Ambulatory Visit: Payer: Self-pay | Admitting: Urology

## 2020-08-16 LAB — URINALYSIS, COMPLETE
Bilirubin, UA: NEGATIVE
Glucose, UA: NEGATIVE
Ketones, UA: NEGATIVE
Leukocytes,UA: NEGATIVE
Nitrite, UA: NEGATIVE
Protein,UA: NEGATIVE
Specific Gravity, UA: 1.015 (ref 1.005–1.030)
Urobilinogen, Ur: 0.2 mg/dL (ref 0.2–1.0)
pH, UA: 5.5 (ref 5.0–7.5)

## 2020-08-16 LAB — MICROSCOPIC EXAMINATION: Bacteria, UA: NONE SEEN

## 2020-08-20 ENCOUNTER — Ambulatory Visit (INDEPENDENT_AMBULATORY_CARE_PROVIDER_SITE_OTHER): Payer: PPO

## 2020-08-20 ENCOUNTER — Other Ambulatory Visit: Payer: Self-pay

## 2020-08-20 DIAGNOSIS — E785 Hyperlipidemia, unspecified: Secondary | ICD-10-CM | POA: Diagnosis not present

## 2020-08-20 DIAGNOSIS — E119 Type 2 diabetes mellitus without complications: Secondary | ICD-10-CM | POA: Diagnosis not present

## 2020-08-20 DIAGNOSIS — F5104 Psychophysiologic insomnia: Secondary | ICD-10-CM

## 2020-08-20 DIAGNOSIS — M810 Age-related osteoporosis without current pathological fracture: Secondary | ICD-10-CM

## 2020-08-20 DIAGNOSIS — E1169 Type 2 diabetes mellitus with other specified complication: Secondary | ICD-10-CM

## 2020-08-20 NOTE — Progress Notes (Signed)
Encounter details: CCM Time Spent      Value Time User   Time spent with patient (minutes)  42 08/20/2020  2:48 PM [name], [facility]   Time spent performing Chart review  30 08/20/2020  2:48 PM [name], [facility]   Total time (minutes)  72 08/20/2020  2:48 PM [name], RPH     Moderate to High Complex Decision Making      Value Time User   Moderate to High complex decision making  Yes 08/20/2020  2:48 PM [name], [facility]     CCM Services: This encounter meets complex CCM services and moderate to high decision making.  Prior to outreach and patient consent for Chronic Care Management, I referred this patient for services after reviewing the nominated patient list or from a personal encounter with the patient.  I have personally reviewed this encounter including the documentation in this note and have collaborated with the care management provider regarding care management and care coordination activities to include development and update of the comprehensive care plan. I am certifying that I agree with the content of this note and encounter as supervising physician.

## 2020-08-20 NOTE — Progress Notes (Signed)
Chronic Care Management Pharmacy Note  08/20/2020 Name:  Katelyn Kennedy MRN:  [id number] DOB:  1945/10/06  Subjective: Katelyn Kennedy is an 75 y.o. year old female who is a primary patient of Bedsole, Amy E, MD.  The CCM team was consulted for assistance with disease management and care coordination needs.    Engaged with patient by telephone for follow up visit in response to provider referral for pharmacy case management and/or care coordination services. Patient denies any medication concerns. She reports doing well, no pain. Occasional left leg tingling has improved on B12 supplementation.  Consent to Services:  The patient was given information about Chronic Care Management services, agreed to services, and gave verbal consent prior to initiation of services.  Please see initial visit note for detailed documentation.   Patient Care Team: [name], MD as PCP - Claris Gower, [facility] as Pharmacist (Pharmacist)  Recent office visits: 04/30/20 - PCP - Pain in lower left extremity, start B12 supplement 1000 mcg daily. Increase exercise.   Recent consult visits: 08/15/20 - Urology - History of bladder cancer, screening normal, RTC 1 year 05/14/20 - Oncology - Visit to discuss CT scan of chest, no concerning findings for disease recurrence. Repeat in 6 months.   Hospital visits: None in previous 6 months  Objective:  Lab Results  Component Value Date   CREATININE 0.90 05/13/2020   BUN 15 05/13/2020   GFR 75.09 04/17/2020   GFRNONAA >60 05/13/2020   GFRAA >60 10/31/2019   NA 142 05/13/2020   K 4.3 05/13/2020   CALCIUM 10.6 (H) 05/13/2020   CO2 26 05/13/2020   GLUCOSE 105 (H) 05/13/2020    Lab Results  Component Value Date/Time   HGBA1C 5.7 04/17/2020 10:39 AM   HGBA1C 5.5 10/10/2019 10:56 AM   GFR 75.09 04/17/2020 10:39 AM   GFR 71.88 10/10/2019 10:56 AM   MICROALBUR <0.7 04/04/2019 11:05 AM   MICROALBUR 0.3 07/21/2013 12:14 PM    Last diabetic Eye exam:   Lab Results  Component Value Date/Time   HMDIABEYEEXA No Retinopathy 08/25/2019 12:00 AM    Last diabetic Foot exam:  Lab Results  Component Value Date/Time   HMDIABFOOTEX done 10/13/2019 12:00 AM     Lab Results  Component Value Date   CHOL 190 04/17/2020   HDL 64.90 04/17/2020   LDLCALC 88 04/17/2020   LDLDIRECT 118.7 01/24/2013   TRIG 187.0 (H) 04/17/2020   CHOLHDL 3 04/17/2020    Hepatic Function Latest Ref Rng & Units 05/13/2020 04/17/2020 10/31/2019  Total Protein 6.5 - 8.1 g/dL 7.6 6.7 7.2  Albumin 3.5 - 5.0 g/dL 4.4 4.5 4.1  AST 15 - 41 U/L _0 ALT 0 - 44 U/L _1 Alk Phosphatase 38 - 126 U/L 104 102 98  Total Bilirubin 0.3 - 1.2 mg/dL 0.5 0.6 0.5  Bilirubin, Direct 0.0 - 0.3 mg/dL - - -    Lab Results  Component Value Date/Time   TSH 2.71 04/17/2020 10:39 AM   TSH 1.837 09/27/2018 12:26 PM   TSH 1.846 08/24/2018 09:05 AM   TSH 1.46 01/07/2012 09:45 AM   FREET4 0.79 04/17/2020 10:39 AM    CBC Latest Ref Rng & Units 05/13/2020 10/31/2019 06/30/2019  WBC 4.0 - 10.5 K/uL 6.9 5.4 6.1  Hemoglobin 12.0 - 15.0 g/dL 14.4 14.0 14.1  Hematocrit 36.0 - 46.0 % 43.5 41.0 42.0  Platelets 150 - 400 K/uL 271 239 230    Lab Results  Component Value Date/Time   VD25OH 46.39 07/31/2016 09:51 AM   VD25OH 50.50 07/29/2015 09:56 AM    Clinical ASCVD: No  The 10-year ASCVD risk score Mikey Bussing DC Jr., et al., 2013) is: 32.1%   Values used to calculate the score:     Age: 59 years     Sex: Female     Is Non-Hispanic African American: No     Diabetic: Yes     Tobacco smoker: No     Systolic Blood Pressure: 371 mmHg     Is BP treated: Yes     HDL Cholesterol: 64.9 mg/dL     Total Cholesterol: 190 mg/dL    Depression screen [facility] 2/9 10/10/2019 09/27/2018 09/21/2017  Decreased Interest 1 0 0  Down, Depressed, Hopeless 1 0 0  PHQ - 2 Score 2 0 0  Altered sleeping 3 0 -  Tired, decreased energy 0 0 -  Change in appetite 0 0 -  Feeling bad or failure about yourself  0 0  -  Trouble concentrating 0 0 -  Moving slowly or fidgety/restless 0 0 -  Suicidal thoughts 0 0 -  PHQ-9 Score 5 0 -  Difficult doing work/chores Not difficult at all Not difficult at all -  Some recent data might be hidden    Social History   Tobacco Use  Smoking Status Former Smoker  . Packs/day: 0.50  . Years: 40.00  . Pack years: 20.00  . Types: Cigarettes  . Quit date: 04/29/2017  . Years since quitting: 3.3  Smokeless Tobacco Never Used   BP Readings from Last 3 Encounters:  08/15/20 111/72  05/14/20 136/72  11/02/19 122/71   Pulse Readings from Last 3 Encounters:  08/15/20 (!) 123  05/14/20 (!) 115  11/02/19 (!) 106   Wt Readings from Last 3 Encounters:  08/15/20 139 lb (63 kg)  05/14/20 139 lb 4.8 oz (63.2 kg)  11/02/19 136 lb 1.6 oz (61.7 kg)   BMI Readings from Last 3 Encounters:  08/15/20 27.15 kg/m  05/14/20 27.66 kg/m  11/02/19 27.03 kg/m    Assessment/Interventions: Review of patient past medical history, allergies, medications, health status, including review of consultants reports, laboratory and other test data, was performed as part of comprehensive evaluation and provision of chronic care management services.   SDOH:  (Social Determinants of Health) assessments and interventions performed: Yes SDOH Interventions   Flowsheet Row Most Recent Value  SDOH Interventions   Financial Strain Interventions Intervention Not Indicated  [Medications affordable]     SDOH Screenings   Alcohol Screen: Low Risk   . Last Alcohol Screening Score (AUDIT): 1  Depression (PHQ2-9): Medium Risk  . PHQ-2 Score: 5  Financial Resource Strain: Low Risk   . Difficulty of Paying Living Expenses: Not hard at all  Food Insecurity: No Food Insecurity  . Worried About Charity fundraiser in the Last Year: Never true  . Ran Out of Food in the Last Year: Never true  Housing: Low Risk   . Last Housing Risk Score: 0  Physical Activity: Inactive  . Days of Exercise per  Week: 0 days  . Minutes of Exercise per Session: 0 min  Social Connections: Not on file  Stress: No Stress Concern Present  . Feeling of Stress : Not at all  Tobacco Use: Medium Risk  . Smoking Tobacco Use: Former Smoker  . Smokeless Tobacco Use: Never Used  Transportation Needs: No Transportation Needs  . Lack of Transportation (Medical): No  .  Lack of Transportation (Non-Medical): No    CCM Care Plan  Allergies  Allergen Reactions  . Codeine Nausea Only    Medications Reviewed Today    Reviewed by [name], [facility] (Pharmacist) on 08/20/20 at 1011  Med List Status: <None>  Medication Order Taking? Sig Documenting Provider Last Dose Status Informant  acetaminophen (TYLENOL) 500 MG tablet [id number] Yes Take 500 mg by mouth daily as needed for moderate pain or headache. [provider] Taking Active Self  amitriptyline (ELAVIL) 25 MG tablet [id number] Yes TAKE 1 TABLET(25 MG) BY MOUTH AT BEDTIME Bedsole, Amy E, MD Taking Active   atorvastatin (LIPITOR) 20 MG tablet [id number] Yes TAKE 1 TABLET(20 MG) BY MOUTH DAILY Bedsole, Amy E, MD Taking Active   lisinopril (ZESTRIL) 2.5 MG tablet [id number] Yes TAKE 1 TABLET(2.5 MG) BY MOUTH DAILY Bedsole, Amy E, MD Taking Active   Multiple Vitamin (MULTIVITAMIN) capsule [id number] Yes Take 1 capsule by mouth daily. [provider] Taking Active Self  Polyethyl Glycol-Propyl Glycol 0.4-0.3 % SOLN [id number] Yes Apply to eye as needed.  [provider] Taking Active   vitamin B-12 (CYANOCOBALAMIN) 1000 MCG tablet [id number] Yes Take 1,000 mcg by mouth daily. [provider] Taking Active Self          Patient Active Problem List   Diagnosis Date Noted  . Pain in lateral left lower extremity 04/30/2020  . Multiple thyroid nodules 04/03/2019  . History of bladder cancer 09/30/2018  . Encounter for antineoplastic immunotherapy 09/13/2017  . Chronic insomnia 08/17/2017  . Encounter for antineoplastic  chemotherapy 06/17/2017  . Goals of care, counseling/discussion 06/17/2017  . 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 ([place]) 04/29/2017  . Counseling regarding end of life decision making 07/31/2014  . Eczema 07/20/2011  . MICROALBUMINURIA 03/25/2010  . Osteoporosis 10/08/2009  . Diabetes mellitus with no complication ([place]) 10/04/7626  . Hyperlipidemia associated with type 2 diabetes mellitus ([place]) 09/03/2009  . Allergic rhinitis 09/03/2009  . ARTHRITIS 09/03/2009  . CHICKENPOX, HX OF 09/03/2009    Immunization History  Administered Date(s) Administered  . Influenza Split 01/15/2011, 01/19/2012  . Influenza Whole 01/11/2009, 12/24/2009  . Influenza,inj,Quad PF,6+ Mos 01/27/2013, 02/11/2017, 02/24/2018  . Influenza-Unspecified 11/28/2013  . PFIZER(Purple Top)SARS-COV-2 Vaccination 08/11/2019, 10/11/2019  . Pneumococcal Conjugate-13 07/31/2014  . Pneumococcal Polysaccharide-23 09/03/2009  . Td 04/13/1997, 09/03/2009  . Zoster 11/29/2013    Conditions to be addressed/monitored:  Hyperlipidemia, Diabetes and Osteopenia  Care Plan : [place]  Updates made by [name], [facility] since 08/20/2020 12:00 AM    Problem: CHL AMB "PATIENT-SPECIFIC PROBLEM"     Long-Range Goal: Disease Management   Start Date: 08/20/2020  Priority: High  Note:     Current Barriers:  . None identified  Pharmacist Clinical Goal(s):  Marland Kitchen Patient will contact provider office for questions/concerns as evidenced notation of same in electronic health record through collaboration with PharmD and provider.   Interventions: . 1:1 collaboration with [name], MD regarding development and update of comprehensive plan of care as evidenced by provider attestation and co-signature . Inter-disciplinary care team collaboration (see longitudinal plan of care) . Comprehensive medication review performed; medication list updated in electronic medical record  Hyperlipidemia: (LDL goal  < 100) -Controlled - LDL 88 -Current treatment: . Atorvastatin 20 mg - 1 tablet daily -Medications previously tried: none  -Current dietary patterns: pt reports weight gain of about 6-8 lbs in past year, she watches portions  -Current exercise habits: states she  would like to resume walking  -Educated on Exercise goal of 150 minutes per week; -Recommended to continue current medication; Personal goal to increase walking. Recommend 10-15 minutes 2-3 days per week to start.  Pre-diabetes (A1c goal <7%) -Controlled - A1c 5.7% -Current medications:  Microalbuminuria: on lisinopril 2.5 mg daily -Medications previously tried: none  -Current home glucose readings - none, does not monitor routinely -Denies hypoglycemic/hyperglycemic symptoms -Educated on A1c and blood sugar goals; -Annual foot exams due July 2022 and eye exam due now, May 2022 -Recommended to continue current medication; Schedule annual eye exam.   Patient pulse is still running high - 122 at last office visit. No home BP or HR monitoring to report. Denies palpitations, anxiety, shortness of breath. No falls, imbalance, or dizziness. She does not see a cardiologist. She is comfortable waiting until July for evaluation of tachycardia at this time as asymptomatic. She was instructed to call office with any unusual symptoms.  Osteopenia (Goal: Prevent falls and fractures) -Last DEXA Scan: 2021, Osteopenia   T-Score femoral neck: -2.3  T-Score total hip: n/a  T-Score lumbar spine: -1.7  T-Score forearm radius: n/a  10-year probability of major osteoporotic fracture: 23.7%  10-year probability of hip fracture: 9.7% -Patient is a candidate for pharmacologic treatment due to T-Score -1.0 to -2.5 and 10-year risk of major osteoporotic fracture > 20% and T-Score -1.0 to -2.5 and 10-year risk of hip fracture > 3% -Current treatment  . Vitamin D3 1000 - takes 1 daily  . Calcium supplement - takes 1 daily -Medications previously  tried: none -Recommend [phone] units of vitamin D daily. Recommend 1200 mg of calcium daily from dietary and supplemental sources. Recommend weight-bearing and muscle strengthening exercises for building and maintaining bone density. -Recommended to continue current medication; Consider treatment with prescription therapy to improve bone density. Please call if you would like more information.  Insomnia (Goal: Improve sleep) -Controlled - per patient report -Current treatment  . Amitriptyline 25 mg - 1 tablet daily (30 minutes before bedtime) -Medications previously tried:  Trazodone -Reports about 7 hours of sleep per night. Goes to sleep between 10-11 PM, wakes up once at night to urinate but able to go to sleep fairly quickly, wakes up around 8 AM.  -Takes about half hour to fall asleep  -Recommended to continue current medication  Patient Goals/Self-Care Activities . Patient will:  - focus on medication adherence by continuing to use pill box  -increase exercise by walking 2-3 days per week -schedule your annual eye exam  Follow Up Plan: Telephone follow up appointment with care management team member scheduled for:  12 months  Medication Assistance: None required.  Patient affirms current coverage meets needs.     Patient's preferred pharmacy is:  [facility] [place], [address] [address] Phone: [phone] Fax: [phone]  Uses pill box? Yes Pt endorses 100% compliance - denies missed doses  Care Plan and Follow Up Patient Decision:  Patient agrees to Care Plan and Follow-up.  [name], PharmD Clinical Pharmacist [facility] at [facility] [phone]

## 2020-08-20 NOTE — Patient Instructions (Signed)
Dear Katelyn Kennedy,  Below is a summary of the goals we discussed during our follow up appointment on Aug 20, 2020. Please contact me anytime with questions or concerns.   Visit Information  Patient Care Plan: CCM Pharmacy Care Plan    Problem Identified: CHL AMB "PATIENT-SPECIFIC PROBLEM"     Long-Range Goal: Disease Management   Start Date: 08/20/2020  Priority: High  Note:     Current Barriers:  . None identified  Pharmacist Clinical Goal(s):  Marland Kitchen Patient will contact provider office for questions/concerns as evidenced notation of same in electronic health record through collaboration with PharmD and provider.   Interventions: . 1:1 collaboration with [name], MD regarding development and update of comprehensive plan of care as evidenced by provider attestation and co-signature . Inter-disciplinary care team collaboration (see longitudinal plan of care) . Comprehensive medication review performed; medication list updated in electronic medical record  Hyperlipidemia: (LDL goal < 100) -Controlled - LDL 88 -Current treatment: . Atorvastatin 20 mg - 1 tablet daily -Medications previously tried: none  -Current dietary patterns: pt reports weight gain of about 6-8 lbs in past year, she watches portions  -Current exercise habits: states she would like to resume walking  -Educated on Exercise goal of 150 minutes per week; -Recommended to continue current medication; Personal goal to increase walking. Recommend 10-15 minutes 2-3 days per week to start.  Pre-diabetes (A1c goal <7%) -Controlled - A1c 5.7% -Current medications:  Microalbuminuria: on lisinopril 2.5 mg daily -Medications previously tried: none  -Current home glucose readings - none, does not monitor routinely -Denies hypoglycemic/hyperglycemic symptoms -Educated on A1c and blood sugar goals; -Annual foot exams due July 2022 and eye exam due now, May 2022 -Recommended to continue current medication; Schedule  annual eye exam.   Patient pulse is still running high - 122 at last office visit. No home BP or HR monitoring to report. Denies palpitations, anxiety, shortness of breath. No falls, imbalance, or dizziness. She does not see a cardiologist. She is comfortable waiting until July for evaluation of tachycardia at this time as asymptomatic. She was instructed to call office with any unusual symptoms.  Osteopenia (Goal: Prevent falls and fractures) -Last DEXA Scan: 2021, Osteopenia   T-Score femoral neck: -2.3  T-Score total hip: n/a  T-Score lumbar spine: -1.7  T-Score forearm radius: n/a  10-year probability of major osteoporotic fracture: 23.7%  10-year probability of hip fracture: 9.7% -Patient is a candidate for pharmacologic treatment due to T-Score -1.0 to -2.5 and 10-year risk of major osteoporotic fracture > 20% and T-Score -1.0 to -2.5 and 10-year risk of hip fracture > 3% -Current treatment  . Vitamin D3 1000 - takes 1 daily  . Calcium supplement - takes 1 daily -Medications previously tried: none -Recommend [phone] units of vitamin D daily. Recommend 1200 mg of calcium daily from dietary and supplemental sources. Recommend weight-bearing and muscle strengthening exercises for building and maintaining bone density. -Recommended to continue current medication; Consider treatment with prescription therapy to improve bone density. Please call if you would like more information.  Insomnia (Goal: Improve sleep) -Controlled - per patient report -Current treatment  . Amitriptyline 25 mg - 1 tablet daily (30 minutes before bedtime) -Medications previously tried:  Trazodone -Reports about 7 hours of sleep per night. Goes to sleep between 10-11 PM, wakes up once at night to urinate but able to go to sleep fairly quickly, wakes up around 8 AM.  -Takes about half hour to fall asleep  -  Recommended to continue current medication  Patient Goals/Self-Care Activities . Patient will:  - focus on  medication adherence by continuing to use pill box  -increase exercise by walking 2-3 days per week -schedule your annual eye exam  Follow Up Plan: Telephone follow up appointment with care management team member scheduled for:  12 months  Medication Assistance: None required.  Patient affirms current coverage meets needs.      The patient verbalized understanding of instructions, educational materials, and care plan provided today and agreed to receive a mailed copy of patient instructions, educational materials, and care plan.   [name], PharmD Clinical Pharmacist [facility] at [facility] [phone]   Osteopenia  Osteopenia is a loss of thickness (density) inside the bones. Another name for osteopenia is low bone mass. Mild osteopenia is a normal part of aging. It is not a disease, and it does not cause symptoms. However, if you have osteopenia and continue to lose bone mass, you could develop a condition that causes the bones to become thin and break more easily (osteoporosis). Osteoporosis can cause you to lose some height, have back pain, and have a stooped posture. Although osteopenia is not a disease, making changes to your lifestyle and diet can help to prevent osteopenia from developing into osteoporosis. What are the causes? Osteopenia is caused by loss of calcium in the bones. Bones are constantly changing. Old bone cells are continually being replaced with new bone cells. This process builds new bone. The mineral calcium is needed to build new bone and maintain bone density. Bone density is usually highest around age 6. After that, most people's bodies cannot replace all the bone they have lost with new bone. What increases the risk? You are more likely to develop this condition if:  You are older than age 57.  You are a woman who went through menopause early.  You have a long illness that keeps you in bed.  You do not get enough exercise.  You  lack certain nutrients (malnutrition).  You have an overactive thyroid gland (hyperthyroidism).  You use products that contain nicotine or tobacco, such as cigarettes, e-cigarettes and chewing tobacco, or you drink a lot of alcohol.  You are taking medicines that weaken the bones, such as steroids. What are the signs or symptoms? This condition does not cause any symptoms. You may have a slightly higher risk for bone breaks (fractures), so getting fractures more easily than normal may be an indication of osteopenia. How is this diagnosed? This condition may be diagnosed based on an X-ray exam that measures bone density (dual-energy X-ray absorptiometry, or DEXA). This test can measure bone density in your hips, spine, and wrists. Osteopenia has no symptoms, so this condition is usually diagnosed after a routine bone density screening test is done for osteoporosis. This routine screening is usually done for:  Women who are age 33 or older.  Men who are age 16 or older. If you have risk factors for osteopenia, you may have the screening test at an earlier age. How is this treated? Making dietary and lifestyle changes can lower your risk for osteoporosis. If you have severe osteopenia that is close to becoming osteoporosis, this condition can be treated with medicines and dietary supplements such as calcium and vitamin D. These supplements help to rebuild bone density. Follow these instructions at home: Eating and drinking Eat a diet that is high in calcium and vitamin D.  Calcium is found in dairy products,  beans, salmon, and leafy green vegetables like spinach and broccoli.  Look for foods that have vitamin D and calcium added to them (fortified foods), such as orange juice, cereal, and bread.   Lifestyle  Do 30 minutes or more of a weight-bearing exercise every day, such as walking, jogging, or playing a sport. These types of exercises strengthen the bones.  Do not use any products  that contain nicotine or tobacco, such as cigarettes, e-cigarettes, and chewing tobacco. If you need help quitting, ask your health care provider.  Do not drink alcohol if: ? Your health care provider tells you not to drink. ? You are pregnant, may be pregnant, or are planning to become pregnant.  If you drink alcohol: ? Limit how much you use to:  0-1 drink a day for women.  0-2 drinks a day for men. ? Be aware of how much alcohol is in your drink. In the [place] one drink equals one 12 oz bottle of beer (355 mL), one 5 oz glass of wine (148 mL), or one 1 oz glass of hard liquor (44 mL). General instructions  Take over-the-counter and prescription medicines only as told by your health care provider. These include vitamins and supplements.  Take precautions at home to lower your risk of falling, such as: ? Keeping rooms well-lit and free of clutter, such as cords. ? Installing safety rails on stairs. ? Using rubber mats in the bathroom or other areas that are often wet or slippery.  Keep all follow-up visits. This is important. Contact a health care provider if:  You have not had a bone density screening for osteoporosis and you are: ? A woman who is age 39 or older. ? A man who is age 92 or older.  You are a postmenopausal woman who has not had a bone density screening for osteoporosis.  You are older than age 25 and you want to know if you should have bone density screening for osteoporosis. Summary  Osteopenia is a loss of thickness (density) inside the bones. Another name for osteopenia is low bone mass.  Osteopenia is not a disease, but it may increase your risk for a condition that causes the bones to become thin and break more easily (osteoporosis).  You may be at risk for osteopenia if you are older than age 61 or if you are a woman who went through early menopause.  Osteopenia does not cause any symptoms, but it can be diagnosed with a bone density screening  test.  Dietary and lifestyle changes are the first treatment for osteopenia. These may lower your risk for osteoporosis. This information is not intended to replace advice given to you by your health care provider. Make sure you discuss any questions you have with your health care provider. Document Revised: 09/14/2019 Document Reviewed: 09/14/2019 Elsevier Patient Education  [place].

## 2020-09-03 ENCOUNTER — Telehealth: Payer: Self-pay

## 2020-09-03 NOTE — Progress Notes (Signed)
CCM care plan from [place] appointment on 08/20/20 with [name], Pharm. D, was mailed to her per her request.   [name], CPP notified  [name], [place] Pharmacy Assistant [phone]

## 2020-09-18 ENCOUNTER — Ambulatory Visit: Payer: PPO

## 2020-09-19 ENCOUNTER — Other Ambulatory Visit: Payer: Self-pay

## 2020-09-19 ENCOUNTER — Ambulatory Visit
Admission: RE | Admit: 2020-09-19 | Discharge: 2020-09-19 | Disposition: A | Discharge status: Home / Self Care | Payer: PPO | Source: Ambulatory Visit | Attending: Family Medicine | Admitting: Family Medicine

## 2020-09-19 DIAGNOSIS — Z1231 Encounter for screening mammogram for malignant neoplasm of breast: Secondary | ICD-10-CM

## 2020-09-25 ENCOUNTER — Telehealth: Payer: Self-pay | Admitting: Family Medicine

## 2020-09-25 ENCOUNTER — Telehealth: Payer: Self-pay

## 2020-09-25 NOTE — Telephone Encounter (Signed)
LVM for pt to rtn my call to r/s appt with nha on 10/11/20

## 2020-09-25 NOTE — Progress Notes (Addendum)
Chronic Care Management Pharmacy Assistant   Name: Katelyn Kennedy  MRN: [id number] DOB: 1946-01-05   Reason for Encounter: Disease State HLD  Recent office visits:  09/25/20 - PCP telephone call regarding the patients left thigh pain, no medication changes  Recent consult visits:  08/15/20 - Urology [facility] ordered cystoscopy procedure  Hospital visits:  None in previous 6 months  Medications: Outpatient Encounter Medications as of 09/25/2020  Medication Sig   acetaminophen (TYLENOL) 500 MG tablet Take 500 mg by mouth daily as needed for moderate pain or headache.   amitriptyline (ELAVIL) 25 MG tablet TAKE 1 TABLET(25 MG) BY MOUTH AT BEDTIME   atorvastatin (LIPITOR) 20 MG tablet TAKE 1 TABLET(20 MG) BY MOUTH DAILY   cholecalciferol (VITAMIN D) 25 MCG (1000 UNIT) tablet Take 1,000 Units by mouth daily.   lisinopril (ZESTRIL) 2.5 MG tablet TAKE 1 TABLET(2.5 MG) BY MOUTH DAILY   Multiple Vitamin (MULTIVITAMIN) capsule Take 1 capsule by mouth daily.   Polyethyl Glycol-Propyl Glycol 0.4-0.3 % SOLN Apply to eye as needed.    vitamin B-12 (CYANOCOBALAMIN) 1000 MCG tablet Take 1,000 mcg by mouth daily.   No facility-administered encounter medications on file as of 09/25/2020.   09/25/2020 Name: Katelyn Kennedy MRN: [id number] DOB: 1945-05-15 Katelyn Kennedy is a 75 y.o. year old female who is a primary care patient of Bedsole, Amy E, MD.  Comprehensive medication review performed; Spoke to patient regarding cholesterol  Lipid Panel    Component Value Date/Time   CHOL 190 04/17/2020 1039   TRIG 187.0 (H) 04/17/2020 1039   HDL 64.90 04/17/2020 1039   LDLCALC 88 04/17/2020 1039   LDLDIRECT 118.7 01/24/2013 0943    10-year ASCVD risk score: The 10-year ASCVD risk score Mikey Bussing DC Brooke Bonito., et al., 2013) is: 32.1%   Values used to calculate the score:     Age: 45 years     Sex: Female     Is Non-Hispanic African American: No     Diabetic: Yes     Tobacco smoker: No     Systolic Blood  Pressure: 111 mmHg     Is BP treated: Yes     HDL Cholesterol: 64.9 mg/dL     Total Cholesterol: 190 mg/dL  Current antihyperlipidemic regimen: Atorvastatin 20 mg - take one tablet daily   Previous antihyperlipidemic medications tried: none reported  ASCVD risk enhancing conditions: age >15 and DM  What recent interventions/DTPs have been made by any provider to improve Cholesterol control since last CPP Visit: Patient is working on increasing daily exercise.   Any recent hospitalizations or ED visits since last visit with CPP? No  What diet changes have been made to improve Cholesterol?   The patient states she is making healthy food choices limits salt,low fat foods  What exercise is being done to improve Cholesterol?  The patient reports she is walking 20-30 minutes each day   Adherence Review: Does the patient have >5 day gap between last estimated fill dates? No  Atorvastatin 20mg     07/15/20 90ds   Lisinopril 2.5mg         07/15/20 90ds    PCP appointment on 10/15/2020  and Cardiology appointment on 11/12/2020   [name], CPP notified  Avel Sensor, [place] Assistant [phone]  I have reviewed the care management and care coordination activities outlined in this encounter and I am certifying that I agree with the content of this note. No further action required.  [name], PharmD  Clinical Pharmacist [facility] at [facility] [phone]

## 2020-09-26 ENCOUNTER — Telehealth: Payer: Self-pay

## 2020-09-26 NOTE — Telephone Encounter (Signed)
Patient called stating that her numbness sensation in her left thigh that was discussed in January is still there and is more intense now and she would like to address this further. Per that office note- medication and lab work was mentioned. Can patient try something at this time or does she need an appointment? Dr Diona Browner does not have any openings until July 1 and patient does not feel like she can wait that long. Also, if she needs to be seen in order to re address this, can she see another provider?  Patient states it is a numbness/burning sensation in her left thigh that goes down her leg. This sensation is mainly present when she is standing still on her feet, it improves some with walking and sitting, when she lays down sensation resolves almost 100%. No pain sensation. No falls. Please advise.

## 2020-09-27 NOTE — Telephone Encounter (Signed)
Ms. Vanburen notified as instructed by telephone. Appointment scheduled 11/01/2020 at 11:20 am with Dr. Lorelei Pont.

## 2020-10-02 ENCOUNTER — Ambulatory Visit: Payer: PPO | Admitting: Family Medicine

## 2020-10-10 ENCOUNTER — Telehealth: Payer: Self-pay | Admitting: Family Medicine

## 2020-10-10 DIAGNOSIS — E119 Type 2 diabetes mellitus without complications: Secondary | ICD-10-CM

## 2020-10-10 DIAGNOSIS — E042 Nontoxic multinodular goiter: Secondary | ICD-10-CM

## 2020-10-10 NOTE — Telephone Encounter (Signed)
-----   Message from Ellamae Sia sent at 09/23/2020 11:05 AM EDT ----- Regarding: Lab orders for Friday, 7.1.22 Patient is scheduled for CPX labs, please order future labs, Thanks , Karna Christmas

## 2020-10-11 ENCOUNTER — Ambulatory Visit: Payer: PPO

## 2020-10-11 ENCOUNTER — Other Ambulatory Visit: Payer: Self-pay

## 2020-10-11 ENCOUNTER — Other Ambulatory Visit (INDEPENDENT_AMBULATORY_CARE_PROVIDER_SITE_OTHER): Payer: PPO

## 2020-10-11 DIAGNOSIS — E042 Nontoxic multinodular goiter: Secondary | ICD-10-CM | POA: Diagnosis not present

## 2020-10-11 DIAGNOSIS — E119 Type 2 diabetes mellitus without complications: Secondary | ICD-10-CM | POA: Diagnosis not present

## 2020-10-11 LAB — T4, FREE: Free T4: 0.86 ng/dL (ref 0.60–1.60)

## 2020-10-11 LAB — COMPREHENSIVE METABOLIC PANEL
ALT: 19 U/L (ref 0–35)
AST: 23 U/L (ref 0–37)
Albumin: 4.4 g/dL (ref 3.5–5.2)
Alkaline Phosphatase: 93 U/L (ref 39–117)
BUN: 15 mg/dL (ref 6–23)
CO2: 25 mEq/L (ref 19–32)
Calcium: 9.8 mg/dL (ref 8.4–10.5)
Chloride: 105 mEq/L (ref 96–112)
Creatinine, Ser: 0.81 mg/dL (ref 0.40–1.20)
GFR: 70.42 mL/min (ref >60.00)
Glucose, Bld: 111 mg/dL — ABNORMAL HIGH (ref 70–99)
Potassium: 4.5 mEq/L (ref 3.5–5.1)
Sodium: 138 mEq/L (ref 135–145)
Total Bilirubin: 0.5 mg/dL (ref 0.2–1.2)
Total Protein: 7 g/dL (ref 6.0–8.3)

## 2020-10-11 LAB — LIPID PANEL
Cholesterol: 222 mg/dL — ABNORMAL HIGH (ref 0–200)
HDL: 67.8 mg/dL (ref >39.00)
LDL Cholesterol: 125 mg/dL — ABNORMAL HIGH (ref 0–99)
NonHDL: 153.93
Total CHOL/HDL Ratio: 3
Triglycerides: 144 mg/dL (ref 0.0–149.0)
VLDL: 28.8 mg/dL (ref 0.0–40.0)

## 2020-10-11 LAB — TSH: TSH: 2.54 u[IU]/mL (ref 0.35–5.50)

## 2020-10-11 LAB — T3, FREE: T3, Free: 3.7 pg/mL (ref 2.3–4.2)

## 2020-10-11 LAB — HEMOGLOBIN A1C: Hgb A1c MFr Bld: 5.9 % (ref 4.6–6.5)

## 2020-10-11 NOTE — Progress Notes (Signed)
No critical labs need to be addressed urgently. We will discuss labs in detail at upcoming office visit.   

## 2020-10-15 ENCOUNTER — Ambulatory Visit (INDEPENDENT_AMBULATORY_CARE_PROVIDER_SITE_OTHER): Payer: PPO | Admitting: Family Medicine

## 2020-10-15 ENCOUNTER — Encounter: Payer: Self-pay | Admitting: Family Medicine

## 2020-10-15 ENCOUNTER — Other Ambulatory Visit: Payer: Self-pay

## 2020-10-15 VITALS — BP 103/63 | HR 106 | Temp 98.2°F | Ht 59.0 in | Wt 136.5 lb

## 2020-10-15 DIAGNOSIS — F5104 Psychophysiologic insomnia: Secondary | ICD-10-CM

## 2020-10-15 DIAGNOSIS — I2510 Atherosclerotic heart disease of native coronary artery without angina pectoris: Secondary | ICD-10-CM

## 2020-10-15 DIAGNOSIS — R801 Persistent proteinuria, unspecified: Secondary | ICD-10-CM

## 2020-10-15 DIAGNOSIS — Z85118 Personal history of other malignant neoplasm of bronchus and lung: Secondary | ICD-10-CM

## 2020-10-15 DIAGNOSIS — E119 Type 2 diabetes mellitus without complications: Secondary | ICD-10-CM

## 2020-10-15 DIAGNOSIS — I700 Atherosclerosis of aorta: Secondary | ICD-10-CM

## 2020-10-15 DIAGNOSIS — E785 Hyperlipidemia, unspecified: Secondary | ICD-10-CM

## 2020-10-15 DIAGNOSIS — E042 Nontoxic multinodular goiter: Secondary | ICD-10-CM

## 2020-10-15 DIAGNOSIS — E1169 Type 2 diabetes mellitus with other specified complication: Secondary | ICD-10-CM | POA: Diagnosis not present

## 2020-10-15 DIAGNOSIS — Z0000 Encounter for general adult medical examination without abnormal findings: Secondary | ICD-10-CM

## 2020-10-15 DIAGNOSIS — Z8551 Personal history of malignant neoplasm of bladder: Secondary | ICD-10-CM | POA: Diagnosis not present

## 2020-10-15 MED ORDER — ATORVASTATIN CALCIUM 40 MG PO TABS: ORAL_TABLET | ORAL | 3 refills | Status: AC

## 2020-10-15 MED ORDER — AMITRIPTYLINE HCL 50 MG PO TABS: 50.0000 mg | ORAL_TABLET | Freq: Every day | ORAL | 3 refills | Status: DC

## 2020-10-15 NOTE — Patient Instructions (Addendum)
Trail of increase dose of amitriptyline for leg and sleep.  Work on regular exercise, low cholesterol diet.  Increase atorvastatin 40 mg daily.  Start ROM exercise in hips.  Follow up if not improving for further eval.

## 2020-10-15 NOTE — Assessment & Plan Note (Signed)
On ACEI.Marland Kitchen some lower BP but no SE.

## 2020-10-15 NOTE — Assessment & Plan Note (Addendum)
Followed on Chest CT when lung cancer imaging performed.. small and unchanging.

## 2020-10-15 NOTE — Assessment & Plan Note (Signed)
S/P chemo, surgery, immunotherapy.. followed by Adella Nissen

## 2020-10-15 NOTE — Assessment & Plan Note (Signed)
Increase atorvastatin 40 mg daily. Goal LDL < 7given aortic atherosclerosis and CAD on lung CT.

## 2020-10-15 NOTE — Assessment & Plan Note (Signed)
Poor control.. try increase dose of amitriptyline to 50 mg at bedtime.

## 2020-10-15 NOTE — Assessment & Plan Note (Addendum)
LDL goal < 70.  Increase atorvastatin 40 mg daily.  Need cholesterol and DM control.

## 2020-10-15 NOTE — Assessment & Plan Note (Signed)
Followed by oncology 

## 2020-10-15 NOTE — Progress Notes (Signed)
Patient ID: Katelyn Kennedy, female    DOB: February 23, 1946, 75 y.o.   MRN: [id number]  This visit was conducted in person.  BP 103/63   Pulse (!) 106   Temp 98.2 F (36.8 C) (Temporal)   Ht 4\' 11"  (1.499 m)   Wt 136 lb 8 oz (61.9 kg)   SpO2 96%   BMI 27.57 kg/m    CC:  Chief Complaint  Patient presents with   Medicare Wellness    Subjective:   HPI: Katelyn Kennedy is a 75 y.o. female presenting on 10/15/2020 for Medicare Wellness   The patient presents for annual medicare wellness, complete physical and review of chronic health problems. He/She also has the following acute concerns today: Occ numbness and tingling in left anterior thigh, no low bak pain  Does not keep her up at night. Has been going on  2 months Gradually worsening in last month.  She had something similar when she was pregnant.   I have personally reviewed the Medicare Annual Wellness questionnaire and have noted 1. The patient's medical and social history 2. Their use of alcohol, tobacco or illicit drugs 3. Their current medications and supplements 4. The patient's functional ability including ADL's, fall risks, home safety risks and hearing or visual             impairment. 5. Diet and physical activities 6. Evidence for depression or mood disorders 7.         Updated provider list Cognitive evaluation was performed and recorded on pt medicare questionnaire form. The patients weight, height, BMI and visual acuity have been recorded in the chart   I have made referrals, counseling and provided education to the patient based review of the above and I have provided the pt with a written personalized care plan for preventive services.   Documentation of this information was scanned into the electronic record under the media tab.   Advance directives and end of life planning reviewed in detail with patient and documented in EMR. Patient given handout on advance care directives if needed. HCPOA and living  will updated if needed.  Hearing Screening  Method: Audiometry   500Hz  1000Hz  2000Hz  4000Hz   Right ear 20 20 20 20   Left ear 20 20 20 20   Vision Screening - Comments:: Wears Glasses-Has Eye Exam with Dr. Shirley Muscat at [facility] scheduled for 11/2020  Fall Risk  10/15/2020 10/10/2019 09/27/2018 09/21/2017 08/17/2017  Falls in the past year? 1 0 0 No No  Number falls in past yr: 0 0 - - -  Injury with Fall? 0 0 - - -  Risk for fall due to : - Medication side effect - - -  Follow up - Falls evaluation completed;Falls prevention discussed - - -    PHQ9 SCORE ONLY 10/15/2020 10/10/2019 09/27/2018  PHQ-9 Total Score 0 5 0    Insomnia:  inadequate control on amitriptyline  Diabetes:   good control on  no medication. Lab Results  Component Value Date   HGBA1C 5.9 10/11/2020  Using medications without difficulties: Hypoglycemic episodes: Hyperglycemic episodes: Feet problems: Blood Sugars averaging: eye exam within last year:  Thyroid nodules followed by ENDO Dr. Loanne Drilling. No further work up needed.  Hx of Lung cancer: S/P chemo, surgery, immunotherapy.. followed by ONC    Q 6 moth chest CT. Elevated Cholesterol:  LDL not at goal on atorvastatin  20 mg  Lab Results  Component Value Date   CHOL 222 (H)  10/11/2020   HDL 67.80 10/11/2020   LDLCALC 125 (H) 10/11/2020   LDLDIRECT 118.7 01/24/2013   TRIG 144.0 10/11/2020   CHOLHDL 3 10/11/2020  Using medications without problems:none Muscle aches:  none Diet compliance: moderate Exercise: none Other complaints:  Microalbuminuria:  On ACEI  Relevant past medical, surgical, family and social history reviewed and updated as indicated. Interim medical history since our last visit reviewed. Allergies and medications reviewed and updated. Outpatient Medications Prior to Visit  Medication Sig Dispense Refill   acetaminophen (TYLENOL) 500 MG tablet Take 500 mg by mouth daily as needed for moderate pain or headache.     amitriptyline  (ELAVIL) 25 MG tablet TAKE 1 TABLET(25 MG) BY MOUTH AT BEDTIME 30 tablet 11   atorvastatin (LIPITOR) 20 MG tablet TAKE 1 TABLET(20 MG) BY MOUTH DAILY 90 tablet 3   cholecalciferol (VITAMIN D) 25 MCG (1000 UNIT) tablet Take 1,000 Units by mouth daily.     lisinopril (ZESTRIL) 2.5 MG tablet TAKE 1 TABLET(2.5 MG) BY MOUTH DAILY 90 tablet 0   Multiple Vitamin (MULTIVITAMIN) capsule Take 1 capsule by mouth daily.     Polyethyl Glycol-Propyl Glycol 0.4-0.3 % SOLN Apply to eye as needed.      vitamin B-12 (CYANOCOBALAMIN) 1000 MCG tablet Take 1,000 mcg by mouth daily.     No facility-administered medications prior to visit.     Per HPI unless specifically indicated in ROS section below Review of Systems  Constitutional:  Negative for fatigue and fever.  HENT:  Negative for ear pain.   Eyes:  Negative for pain.  Respiratory:  Negative for chest tightness and shortness of breath.   Cardiovascular:  Negative for chest pain, palpitations and leg swelling.  Gastrointestinal:  Negative for abdominal pain.  Genitourinary:  Negative for dysuria.  Objective:  BP 103/63   Pulse (!) 106   Temp 98.2 F (36.8 C) (Temporal)   Ht 4\' 11"  (1.499 m)   Wt 136 lb 8 oz (61.9 kg)   SpO2 96%   BMI 27.57 kg/m   Wt Readings from Last 3 Encounters:  10/15/20 136 lb 8 oz (61.9 kg)  08/15/20 139 lb (63 kg)  05/14/20 139 lb 4.8 oz (63.2 kg)      Physical Exam Constitutional:      General: She is not in acute distress.    Appearance: Normal appearance. She is well-developed. She is not ill-appearing or toxic-appearing.  HENT:     Head: Normocephalic.     Right Ear: Hearing, tympanic membrane, ear canal and external ear normal.     Left Ear: Hearing, tympanic membrane, ear canal and external ear normal.     Nose: Nose normal.  Eyes:     General: Lids are normal. Lids are everted, no foreign bodies appreciated.     Conjunctiva/sclera: Conjunctivae normal.     Pupils: Pupils are equal, round, and reactive to  light.  Neck:     Thyroid: No thyroid mass or thyromegaly.     Vascular: No carotid bruit.     Trachea: Trachea normal.  Cardiovascular:     Rate and Rhythm: Normal rate and regular rhythm.     Heart sounds: Normal heart sounds, S1 normal and S2 normal. No murmur heard.   No gallop.  Pulmonary:     Effort: Pulmonary effort is normal. No respiratory distress.     Breath sounds: Normal breath sounds. No wheezing, rhonchi or rales.  Abdominal:     General: Bowel sounds are normal. There  is no distension or abdominal bruit.     Palpations: Abdomen is soft. There is no fluid wave or mass.     Tenderness: There is no abdominal tenderness. There is no guarding or rebound.     Hernia: No hernia is present.  Musculoskeletal:     Cervical back: Normal range of motion and neck supple.  Lymphadenopathy:     Cervical: No cervical adenopathy.  Skin:    General: Skin is warm and dry.     Findings: No rash.  Neurological:     Mental Status: She is alert.     Cranial Nerves: No cranial nerve deficit.     Sensory: No sensory deficit.  Psychiatric:        Mood and Affect: Mood is not anxious or depressed.        Speech: Speech normal.        Behavior: Behavior normal. Behavior is cooperative.        Judgment: Judgment normal.      Results for orders placed or performed in visit on 10/11/20  T3, free  Result Value Ref Range   T3, Free 3.7 2.3 - 4.2 pg/mL  T4, free  Result Value Ref Range   Free T4 0.86 0.60 - 1.60 ng/dL  TSH  Result Value Ref Range   TSH 2.54 0.35 - 5.50 uIU/mL  Comprehensive metabolic panel  Result Value Ref Range   Sodium 138 135 - 145 mEq/L   Potassium 4.5 3.5 - 5.1 mEq/L   Chloride 105 96 - 112 mEq/L   CO2 25 19 - 32 mEq/L   Glucose, Bld 111 (H) 70 - 99 mg/dL   BUN 15 6 - 23 mg/dL   Creatinine, Ser 0.81 0.40 - 1.20 mg/dL   Total Bilirubin 0.5 0.2 - 1.2 mg/dL   Alkaline Phosphatase 93 39 - 117 U/L   AST 23 0 - 37 U/L   ALT 19 0 - 35 U/L   Total Protein 7.0  6.0 - 8.3 g/dL   Albumin 4.4 3.5 - 5.2 g/dL   GFR 70.42 >60.00 mL/min   Calcium 9.8 8.4 - 10.5 mg/dL  Hemoglobin A1c  Result Value Ref Range   Hgb A1c MFr Bld 5.9 4.6 - 6.5 %  Lipid panel  Result Value Ref Range   Cholesterol 222 (H) 0 - 200 mg/dL   Triglycerides 144.0 0.0 - 149.0 mg/dL   HDL 67.80 >39.00 mg/dL   VLDL 28.8 0.0 - 40.0 mg/dL   LDL Cholesterol 125 (H) 0 - 99 mg/dL   Total CHOL/HDL Ratio 3    NonHDL 153.93     This visit occurred during the SARS-CoV-2 public health emergency.  Safety protocols were in place, including screening questions prior to the visit, additional usage of staff PPE, and extensive cleaning of exam room while observing appropriate contact time as indicated for disinfecting solutions.   COVID 19 screen:  No recent travel or known exposure to COVID19 The patient denies respiratory symptoms of COVID 19 at this time. The importance of social distancing was discussed today.   Assessment and Plan The patient's preventative maintenance and recommended screening tests for an annual wellness exam were reviewed in full today. Brought up to date unless services declined.  Counselled on the importance of diet, exercise, and its role in overall health and mortality. The patient's FH and SH was reviewed, including their home life, tobacco status, and drug and alcohol status.   Vaccines: Td refused, PNA 13,23, COVID19 up to  date, Rx for shingrix given Pap/DVE: not indicated Mammo: 09/19/20 Bone Density: 05/2019 osteopenia. Colon: 2018 repeat in 5 years Smoking Status: QUIT. ETOH/ drug QXI:HWTU/UEKC  Hep C: done   Problem List Items Addressed This Visit     Chronic insomnia (Chronic)    Poor control.. try increase dose of amitriptyline to 50 mg at bedtime.       Diabetes mellitus with no complication (HCC) (Chronic)    Chronic , diet controlled Encouraged exercise, weight loss, healthy eating habits.        Relevant Medications   atorvastatin  (LIPITOR) 40 MG tablet   Hyperlipidemia associated with type 2 diabetes mellitus (HCC) (Chronic)      Increase atorvastatin 40 mg daily. Goal LDL < 7given aortic atherosclerosis and CAD on lung CT.       Relevant Medications   atorvastatin (LIPITOR) 40 MG tablet   MICROALBUMINURIA (Chronic)    On ACEI.Marland Kitchen some lower BP but no SE.       Multiple thyroid nodules (Chronic)     Followed on Chest CT when lung cancer imaging performed.. mall and unchanging.       Atherosclerosis of aorta (HCC)    LDL goal < 70.  Increase atorvastatin 40 mg daily.  Need cholesterol and DM control.       Relevant Medications   atorvastatin (LIPITOR) 40 MG tablet   History of bladder cancer    Followed by oncology.       History of lung cancer    S/P chemo, surgery, immunotherapy.. followed by [place]       Other Visit Diagnoses     Medicare annual wellness visit, subsequent    -  Primary   Coronary artery disease involving native coronary artery of native heart without angina pectoris       Relevant Medications   atorvastatin (LIPITOR) 40 MG tablet        Meds ordered this encounter  Medications   amitriptyline (ELAVIL) 50 MG tablet    Sig: Take 1 tablet (50 mg total) by mouth at bedtime.    Dispense:  90 tablet    Refill:  3   atorvastatin (LIPITOR) 40 MG tablet    Sig: 1 tablet  po daily    Dispense:  90 tablet    Refill:  3    Eliezer Lofts, MD

## 2020-10-15 NOTE — Assessment & Plan Note (Signed)
Chronic , diet controlled Encouraged exercise, weight loss, healthy eating habits.

## 2020-10-16 ENCOUNTER — Other Ambulatory Visit: Payer: Self-pay | Admitting: Family Medicine

## 2020-10-17 ENCOUNTER — Ambulatory Visit: Payer: PPO

## 2020-10-23 ENCOUNTER — Telehealth: Payer: Self-pay | Admitting: *Deleted

## 2020-10-23 NOTE — Telephone Encounter (Signed)
Patient called stating that she was in the office last week and saw [name]. . Patient stated that her amitriptyline was doubled. Patient stated that she was given exercises to do. Patient stated that she doubled her amitriptyline and it has had an adverse reaction to it. Patient stated that it kept her awake all night and she has gone back to the regular dose. Patient stated that it is hard for her to do the exercises that she was given. Patient stated that she did do the knee to chest exercises this morning and that did seem to help her a little. Patient stated that she is concerned about falling because she did fall 6/7/222, but did not have an injury. Patient stated that she is favoring her left side and that has been going on for a while but has gotten worse in the past month. Patient wants to know what else [name] would recommend.

## 2020-10-23 NOTE — Telephone Encounter (Signed)
Okay to decrease back to tolerated dose of amytrilptilin if having side effects.   I would recommend her coming in for a repeat exam and likely X-ray of back to evaluate further... we may need to refer to neuro after repeat exam as well.

## 2020-10-24 NOTE — Telephone Encounter (Signed)
Left message for Katelyn Kennedy that is is okay to decrease back to tolerated dose of amitriptyline, if having side effects per [name]. [name] recommends she come in for a repeat exam and likely X-ray of back to evaluate further... we may need to refer her to neuro after repeat exam as well.   I ask patient to call office back to schedule appointment with [name].

## 2020-10-24 NOTE — Telephone Encounter (Signed)
Appointment scheduled 10/29/2020 at 2:40 pm with [name].

## 2020-10-29 ENCOUNTER — Other Ambulatory Visit: Payer: Self-pay

## 2020-10-29 ENCOUNTER — Ambulatory Visit (INDEPENDENT_AMBULATORY_CARE_PROVIDER_SITE_OTHER): Payer: PPO | Admitting: Family Medicine

## 2020-10-29 ENCOUNTER — Encounter: Payer: Self-pay | Admitting: Family Medicine

## 2020-10-29 VITALS — BP 110/60 | HR 124 | Temp 98.2°F | Ht 59.0 in | Wt 134.5 lb

## 2020-10-29 DIAGNOSIS — R531 Weakness: Secondary | ICD-10-CM | POA: Diagnosis not present

## 2020-10-29 DIAGNOSIS — Z8551 Personal history of malignant neoplasm of bladder: Secondary | ICD-10-CM

## 2020-10-29 DIAGNOSIS — Z85118 Personal history of other malignant neoplasm of bronchus and lung: Secondary | ICD-10-CM

## 2020-10-29 NOTE — Patient Instructions (Addendum)
Start aspirin 81 mg daily.  We will set up an MRI  urgently to evaluate left sided weakness.  If symptoms worsening go to ER.  Keep appt with oncology on 11/11/2020

## 2020-10-29 NOTE — Progress Notes (Signed)
Patient ID: Katelyn Kennedy, female    DOB: 07/02/1945, 75 y.o.   MRN: [id number]  This visit was conducted in person.  BP 110/60   Pulse (!) 124   Temp 98.2 F (36.8 C) (Temporal)   Ht 4\' 11"  (1.499 m)   Wt 134 lb 8 oz (61 kg)   SpO2 96%   BMI 27.17 kg/m    CC: Chief Complaint  Patient presents with   Follow-up    Thigh Pain    Subjective:   HPI: Katelyn Kennedy is a 75 y.o. female  with history of  lung cancer and bladder cancer presenting on 10/29/2020 for Follow-up (Thigh Pain) Hx of Lung cancer: S/P chemo, surgery, immunotherapy.. followed by ONC   She has noted left lateral thigh pain  and tingling in last  several weeks.  At last OV for Medicare wellness ( reviewed) she was given amitriptyline as trial for possible Meralgia paresthetica   as cause of lateral leg numbness and pain.  She was unable to tolerate the medication and thigh pain has continued to increase. Her symptoms have progressed significantly.   Today she reports  she has continue to have left leg pain and tingling but also she now reports weakness in left arm and new weakness in left leg... feels like it was sudden onset on 7/12. She noted she was dropping thing from left arm. Trouble getting out of car. Trouble taking trash out.. dropped. Still with burning pain in left lateral leg.. cannot sleep with pain.  Occ pain in low back but none now, no neck pain.  No numbness or tingling in arms.  No slurred speech,  no confusion,  no facial droop.  4-5 days ago sharp shortlived pain in right head.  Fell on 09/17/2020  but hit right knee, no head injury.    Cramping in left foot, noted spasms/tremor in left leg. Now using walker, trouble standing up   BP Readings from Last 3 Encounters:  10/29/20 110/60  10/15/20 103/63  08/15/20 111/72          Relevant past medical, surgical, family and social history reviewed and updated as indicated. Interim medical history since our last visit  reviewed. Allergies and medications reviewed and updated. Outpatient Medications Prior to Visit  Medication Sig Dispense Refill   acetaminophen (TYLENOL) 500 MG tablet Take 500 mg by mouth daily as needed for moderate pain or headache.     amitriptyline (ELAVIL) 50 MG tablet Take 1 tablet (50 mg total) by mouth at bedtime. 90 tablet 3   atorvastatin (LIPITOR) 40 MG tablet 1 tablet  po daily 90 tablet 3   cholecalciferol (VITAMIN D) 25 MCG (1000 UNIT) tablet Take 1,000 Units by mouth daily.     lisinopril (ZESTRIL) 2.5 MG tablet TAKE 1 TABLET(2.5 MG) BY MOUTH DAILY 90 tablet 3   Multiple Vitamin (MULTIVITAMIN) capsule Take 1 capsule by mouth daily.     Polyethyl Glycol-Propyl Glycol 0.4-0.3 % SOLN Apply to eye as needed.      vitamin B-12 (CYANOCOBALAMIN) 1000 MCG tablet Take 1,000 mcg by mouth daily.     No facility-administered medications prior to visit.     Per HPI unless specifically indicated in ROS section below Review of Systems  Constitutional:  Negative for chills and fever.  HENT:  Negative for congestion and ear pain.   Eyes:  Negative for pain and redness.  Respiratory:  Negative for cough and shortness of breath.   Cardiovascular:  Negative for chest pain, palpitations and leg swelling.  Gastrointestinal:  Negative for abdominal pain, blood in stool, constipation, diarrhea, nausea and vomiting.  Genitourinary:  Negative for dysuria.  Musculoskeletal:  Negative for myalgias.  Skin:  Negative for rash.  Neurological:  Negative for dizziness.  Psychiatric/Behavioral:  The patient is not nervous/anxious.   Objective:  BP 110/60   Pulse (!) 124   Temp 98.2 F (36.8 C) (Temporal)   Ht 4\' 11"  (1.499 m)   Wt 134 lb 8 oz (61 kg)   SpO2 96%   BMI 27.17 kg/m   Wt Readings from Last 3 Encounters:  10/29/20 134 lb 8 oz (61 kg)  10/15/20 136 lb 8 oz (61.9 kg)  08/15/20 139 lb (63 kg)      Physical Exam Constitutional:      General: She is not in acute distress.     Appearance: Normal appearance. She is well-developed. She is not ill-appearing or toxic-appearing.  HENT:     Head: Normocephalic.     Right Ear: Hearing, tympanic membrane, ear canal and external ear normal. Tympanic membrane is not erythematous, retracted or bulging.     Left Ear: Hearing, tympanic membrane, ear canal and external ear normal. Tympanic membrane is not erythematous, retracted or bulging.     Nose: No mucosal edema or rhinorrhea.     Right Sinus: No maxillary sinus tenderness or frontal sinus tenderness.     Left Sinus: No maxillary sinus tenderness or frontal sinus tenderness.     Mouth/Throat:     Pharynx: Uvula midline.  Eyes:     General: Lids are normal. Lids are everted, no foreign bodies appreciated. No visual field deficit.    Conjunctiva/sclera: Conjunctivae normal.     Pupils: Pupils are equal, round, and reactive to light.  Neck:     Thyroid: No thyroid mass or thyromegaly.     Vascular: No carotid bruit.     Trachea: Trachea normal.  Cardiovascular:     Rate and Rhythm: Regular rhythm. Tachycardia present.     Pulses: Normal pulses.     Heart sounds: Normal heart sounds, S1 normal and S2 normal. No murmur heard.   No friction rub. No gallop.  Pulmonary:     Effort: Pulmonary effort is normal. No tachypnea or respiratory distress.     Breath sounds: Normal breath sounds. No decreased breath sounds, wheezing, rhonchi or rales.  Abdominal:     General: Bowel sounds are normal.     Palpations: Abdomen is soft.     Tenderness: There is no abdominal tenderness.  Musculoskeletal:     Cervical back: Normal range of motion and neck supple.  Skin:    General: Skin is warm and dry.     Findings: No rash.  Neurological:     Mental Status: She is alert and oriented to person, place, and time.     GCS: GCS eye subscore is 4. GCS verbal subscore is 5. GCS motor subscore is 6.     Cranial Nerves: No cranial nerve deficit, dysarthria or facial asymmetry.     Sensory:  Sensory deficit present.     Motor: Weakness present. No abnormal muscle tone.     Coordination: Coordination normal.     Gait: Gait normal.     Deep Tendon Reflexes: Reflexes are normal and symmetric.     Comments: Abnormal cerebellar exam, abnormal gait.Marland Kitchen using walker.. dragging left side   No papilledema  Decreased strength diffusely in left upper and  left lower ext.  4/5 throughout on left Left foot drop  Cannot shrug shoulder up on left  Tingling in left upper thigh, no other numbness  Psychiatric:        Mood and Affect: Mood is not anxious or depressed.        Speech: Speech normal.        Behavior: Behavior normal. Behavior is cooperative.        Thought Content: Thought content normal.        Cognition and Memory: Memory is not impaired. She does not exhibit impaired recent memory or impaired remote memory.        Judgment: Judgment normal.      Results for orders placed or performed in visit on 10/11/20  T3, free  Result Value Ref Range   T3, Free 3.7 2.3 - 4.2 pg/mL  T4, free  Result Value Ref Range   Free T4 0.86 0.60 - 1.60 ng/dL  TSH  Result Value Ref Range   TSH 2.54 0.35 - 5.50 uIU/mL  Comprehensive metabolic panel  Result Value Ref Range   Sodium 138 135 - 145 mEq/L   Potassium 4.5 3.5 - 5.1 mEq/L   Chloride 105 96 - 112 mEq/L   CO2 25 19 - 32 mEq/L   Glucose, Bld 111 (H) 70 - 99 mg/dL   BUN 15 6 - 23 mg/dL   Creatinine, Ser 0.81 0.40 - 1.20 mg/dL   Total Bilirubin 0.5 0.2 - 1.2 mg/dL   Alkaline Phosphatase 93 39 - 117 U/L   AST 23 0 - 37 U/L   ALT 19 0 - 35 U/L   Total Protein 7.0 6.0 - 8.3 g/dL   Albumin 4.4 3.5 - 5.2 g/dL   GFR 70.42 >60.00 mL/min   Calcium 9.8 8.4 - 10.5 mg/dL  Hemoglobin A1c  Result Value Ref Range   Hgb A1c MFr Bld 5.9 4.6 - 6.5 %  Lipid panel  Result Value Ref Range   Cholesterol 222 (H) 0 - 200 mg/dL   Triglycerides 144.0 0.0 - 149.0 mg/dL   HDL 67.80 >39.00 mg/dL   VLDL 28.8 0.0 - 40.0 mg/dL   LDL Cholesterol 125  (H) 0 - 99 mg/dL   Total CHOL/HDL Ratio 3    NonHDL 153.93     This visit occurred during the SARS-CoV-2 public health emergency.  Safety protocols were in place, including screening questions prior to the visit, additional usage of staff PPE, and extensive cleaning of exam room while observing appropriate contact time as indicated for disinfecting solutions.   COVID 19 screen:  No recent travel or known exposure to COVID19 The patient denies respiratory symptoms of COVID 19 at this time. The importance of social distancing was discussed today.   Assessment and Plan Problem List Items Addressed This Visit     History of bladder cancer   Relevant Orders   MR Brain Wo Contrast   History of lung cancer   Relevant Orders   MR Brain Wo Contrast   Other Visit Diagnoses     Left-sided weakness    -  Primary   Relevant Orders   MR Brain Wo Contrast        Given history of cancer recently in lung.. brain metastasis or CVA is high on differential with acute weakness on left side. Start ASA 81 mg daily for prevention and will eval with MRI brain.    Has appt with oncology on 11/11/2020   Depending on results will  likely need vascular imaging as well.      Eliezer Lofts, MD

## 2020-10-30 ENCOUNTER — Ambulatory Visit (HOSPITAL_COMMUNITY)
Admission: RE | Admit: 2020-10-30 | Discharge: 2020-10-30 | Disposition: A | Discharge status: Home / Self Care | Payer: PPO | Source: Ambulatory Visit | Attending: Family Medicine | Admitting: Family Medicine

## 2020-10-30 DIAGNOSIS — G936 Cerebral edema: Secondary | ICD-10-CM | POA: Diagnosis not present

## 2020-10-30 DIAGNOSIS — Z85118 Personal history of other malignant neoplasm of bronchus and lung: Secondary | ICD-10-CM | POA: Diagnosis not present

## 2020-10-30 DIAGNOSIS — Z8551 Personal history of malignant neoplasm of bladder: Secondary | ICD-10-CM | POA: Diagnosis not present

## 2020-10-30 DIAGNOSIS — R531 Weakness: Secondary | ICD-10-CM | POA: Insufficient documentation

## 2020-10-31 ENCOUNTER — Other Ambulatory Visit: Payer: Self-pay | Admitting: Neurosurgery

## 2020-10-31 ENCOUNTER — Other Ambulatory Visit: Payer: Self-pay | Admitting: Family Medicine

## 2020-10-31 ENCOUNTER — Other Ambulatory Visit: Payer: Self-pay | Admitting: Radiation Therapy

## 2020-10-31 ENCOUNTER — Encounter: Payer: Self-pay | Admitting: Radiation Therapy

## 2020-10-31 DIAGNOSIS — C7949 Secondary malignant neoplasm of other parts of nervous system: Secondary | ICD-10-CM

## 2020-10-31 DIAGNOSIS — D496 Neoplasm of unspecified behavior of brain: Secondary | ICD-10-CM | POA: Diagnosis not present

## 2020-10-31 DIAGNOSIS — C7931 Secondary malignant neoplasm of brain: Secondary | ICD-10-CM

## 2020-10-31 MED ORDER — DEXAMETHASONE 4 MG PO TABS: 4.0000 mg | ORAL_TABLET | Freq: Three times a day (TID) | ORAL | 0 refills | Status: DC

## 2020-10-31 NOTE — Progress Notes (Signed)
Pt notified of need to start medication ASAP.  Pt aware of Appt  with Dr. Annette Stable this afternoon and MRI tommorow.  [name] will call pt with details of appt.

## 2020-10-31 NOTE — Progress Notes (Addendum)
Dr. Annette Stable has replied regarding their visit. He has recommended surgery upfront for path and symptom relief, as there is an outside chance this could be a meningioma and she has a history of both lung and bladder cancer. We will keep the MRI scheduled tomorrow and use that as a surgical navigation tool. Her case will be reviewed again after the path has come back to see if she will need post operative SRS treatment.   Surgery is scheduled for Thursday 7/28.    [name] R.T.(R)(T) Radiation Special Procedures Navigator

## 2020-11-01 ENCOUNTER — Other Ambulatory Visit: Payer: Self-pay

## 2020-11-01 ENCOUNTER — Ambulatory Visit
Admission: RE | Admit: 2020-11-01 | Discharge: 2020-11-01 | Disposition: A | Discharge status: Home / Self Care | Payer: PPO | Source: Ambulatory Visit | Attending: Radiation Oncology | Admitting: Radiation Oncology

## 2020-11-01 DIAGNOSIS — C7949 Secondary malignant neoplasm of other parts of nervous system: Secondary | ICD-10-CM

## 2020-11-01 DIAGNOSIS — C729 Malignant neoplasm of central nervous system, unspecified: Secondary | ICD-10-CM | POA: Diagnosis not present

## 2020-11-01 DIAGNOSIS — C679 Malignant neoplasm of bladder, unspecified: Secondary | ICD-10-CM | POA: Diagnosis not present

## 2020-11-01 DIAGNOSIS — C3490 Malignant neoplasm of unspecified part of unspecified bronchus or lung: Secondary | ICD-10-CM | POA: Diagnosis not present

## 2020-11-01 MED ORDER — GADOBENATE DIMEGLUMINE 529 MG/ML IV SOLN
12.0000 mL | Freq: Once | INTRAVENOUS | Status: AC | PRN
  Administered 2020-11-01: 12 mL via INTRAVENOUS

## 2020-11-04 NOTE — Progress Notes (Signed)
Surgical Instructions    Your procedure is scheduled on 11/07/20.  Report to [facility] Main Entrance "A" at 11:10 A.M., then check in with the Admitting office.  Call this number if you have problems the morning of surgery:  [phone]   If you have any questions prior to your surgery date call [phone]: Open Monday-Friday 8am-4pm    Remember:  Do not eat or drink after midnight the night before your surgery      Take these medicines the morning of surgery with A SIP OF WATER  acetaminophen (TYLENOL) if needed atorvastatin (LIPITOR)  dexamethasone (DECADRON)     As of today, STOP taking any Aspirin (unless otherwise instructed by your surgeon) Aleve, Naproxen, Ibuprofen, Motrin, Advil, Goody's, BC's, all herbal medications, fish oil, and all vitamins.  WHAT DO I DO ABOUT MY DIABETES MEDICATION?   Do not take oral diabetes medicines (pills) the morning of surgery.   The day of surgery, do not take other diabetes injectables, including Byetta (exenatide), Bydureon (exenatide ER), Victoza (liraglutide), or Trulicity (dulaglutide).  If your CBG is greater than 220 mg/dL, you may take  of your sliding scale (correction) dose of insulin.   HOW TO MANAGE YOUR DIABETES BEFORE AND AFTER SURGERY  Why is it important to control my blood sugar before and after surgery? Improving blood sugar levels before and after surgery helps healing and can limit problems. A way of improving blood sugar control is eating a healthy diet by:  Eating less sugar and carbohydrates  Increasing activity/exercise  Talking with your doctor about reaching your blood sugar goals High blood sugars (greater than 180 mg/dL) can raise your risk of infections and slow your recovery, so you will need to focus on controlling your diabetes during the weeks before surgery. Make sure that the doctor who takes care of your diabetes knows about your planned surgery including the date and location.  How do I  manage my blood sugar before surgery? Check your blood sugar at least 4 times a day, starting 2 days before surgery, to make sure that the level is not too high or low.  Check your blood sugar the morning of your surgery when you wake up and every 2 hours until you get to the Short Stay unit.  If your blood sugar is less than 70 mg/dL, you will need to treat for low blood sugar: Do not take insulin. Treat a low blood sugar (less than 70 mg/dL) with  cup of clear juice (cranberry or apple), 4 glucose tablets, OR glucose gel. Recheck blood sugar in 15 minutes after treatment (to make sure it is greater than 70 mg/dL). If your blood sugar is not greater than 70 mg/dL on recheck, call [phone] for further instructions. Report your blood sugar to the short stay nurse when you get to Short Stay.  If you are admitted to the hospital after surgery: Your blood sugar will be checked by the staff and you will probably be given insulin after surgery (instead of oral diabetes medicines) to make sure you have good blood sugar levels. The goal for blood sugar control after surgery is 80-180 mg/dL.           Do not wear jewelry or makeup Do not wear lotions, powders, perfumes, or deodorant. Do not shave 48 hours prior to surgery.   Do not bring valuables to the hospital.  DO Not wear nail polish, gel polish, artificial nails, or any other type of covering on natural nails  including finger and toenails. If patients have artificial nails, gel coating, etc. that need to be removed by a nail salon please have this removed prior to surgery or surgery may need to be canceled/delayed if the surgeon/ anesthesia feels like the patient is unable to be adequately monitored.             [facility] is not responsible for any belongings or valuables.  Do NOT Smoke (Tobacco/Vaping) or drink Alcohol 24 hours prior to your procedure If you use a CPAP at night, you may bring all equipment for your overnight stay.    Contacts, glasses, dentures or bridgework may not be worn into surgery, please bring cases for these belongings   For patients admitted to the hospital, discharge time will be determined by your treatment team.   Patients discharged the day of surgery will not be allowed to drive home, and someone needs to stay with them for 24 hours.  ONLY 1 SUPPORT PERSON MAY BE PRESENT WHILE YOU ARE IN SURGERY. IF YOU ARE TO BE ADMITTED ONCE YOU ARE IN YOUR ROOM YOU WILL BE ALLOWED TWO (2) VISITORS.  Minor children may have two parents present. Special consideration for safety and communication needs will be reviewed on a case by case basis.  Special instructions:    Oral Hygiene is also important to reduce your risk of infection.  Remember - BRUSH YOUR TEETH THE MORNING OF SURGERY WITH YOUR REGULAR TOOTHPASTE   [place]- Preparing For Surgery  Before surgery, you can play an important role. Because skin is not sterile, your skin needs to be as free of germs as possible. You can reduce the number of germs on your skin by washing with CHG (chlorahexidine gluconate) Soap before surgery.  CHG is an antiseptic cleaner which kills germs and bonds with the skin to continue killing germs even after washing.     Please do not use if you have an allergy to CHG or antibacterial soaps. If your skin becomes reddened/irritated stop using the CHG.  Do not shave (including legs and underarms) for at least 48 hours prior to first CHG shower. It is OK to shave your face.  Please follow these instructions carefully.     Shower the NIGHT BEFORE SURGERY and the MORNING OF SURGERY with CHG Soap.   If you chose to wash your hair, wash your hair first as usual with your normal shampoo. After you shampoo, rinse your hair and body thoroughly to remove the shampoo.  Then [employer] and genitals (private parts) with your normal soap and rinse thoroughly to remove soap.  After that Use CHG Soap as you would any other liquid  soap. You can apply CHG directly to the skin and wash gently with a scrungie or a clean washcloth.   Apply the CHG Soap to your body ONLY FROM THE NECK DOWN.  Do not use on open wounds or open sores. Avoid contact with your eyes, ears, mouth and genitals (private parts). Wash Face and genitals (private parts)  with your normal soap.   Wash thoroughly, paying special attention to the area where your surgery will be performed.  Thoroughly rinse your body with warm water from the neck down.  DO NOT shower/wash with your normal soap after using and rinsing off the CHG Soap.  Pat yourself dry with a CLEAN TOWEL.  Wear CLEAN PAJAMAS to bed the night before surgery  Place CLEAN SHEETS on your bed the night before your surgery  DO NOT SLEEP WITH PETS.   Day of Surgery: Take a shower with CHG soap. Wear Clean/Comfortable clothing the morning of surgery Do not apply any deodorants/lotions.   Remember to brush your teeth WITH YOUR REGULAR TOOTHPASTE.   Please read over the following fact sheets that you were given.

## 2020-11-05 ENCOUNTER — Other Ambulatory Visit: Payer: Self-pay

## 2020-11-05 ENCOUNTER — Encounter (HOSPITAL_COMMUNITY): Payer: Self-pay

## 2020-11-05 ENCOUNTER — Encounter (HOSPITAL_COMMUNITY)
Admission: RE | Admit: 2020-11-05 | Discharge: 2020-11-05 | Disposition: A | Discharge status: Home / Self Care | Payer: PPO | Source: Ambulatory Visit | Attending: Neurosurgery | Admitting: Neurosurgery

## 2020-11-05 DIAGNOSIS — Z85118 Personal history of other malignant neoplasm of bronchus and lung: Secondary | ICD-10-CM | POA: Diagnosis not present

## 2020-11-05 DIAGNOSIS — Z01818 Encounter for other preprocedural examination: Secondary | ICD-10-CM | POA: Insufficient documentation

## 2020-11-05 DIAGNOSIS — Z20822 Contact with and (suspected) exposure to covid-19: Secondary | ICD-10-CM | POA: Diagnosis present

## 2020-11-05 DIAGNOSIS — Z87891 Personal history of nicotine dependence: Secondary | ICD-10-CM | POA: Diagnosis not present

## 2020-11-05 DIAGNOSIS — I10 Essential (primary) hypertension: Secondary | ICD-10-CM | POA: Diagnosis not present

## 2020-11-05 DIAGNOSIS — Z833 Family history of diabetes mellitus: Secondary | ICD-10-CM | POA: Diagnosis not present

## 2020-11-05 DIAGNOSIS — D496 Neoplasm of unspecified behavior of brain: Secondary | ICD-10-CM | POA: Diagnosis not present

## 2020-11-05 DIAGNOSIS — Z923 Personal history of irradiation: Secondary | ICD-10-CM | POA: Diagnosis not present

## 2020-11-05 DIAGNOSIS — E1151 Type 2 diabetes mellitus with diabetic peripheral angiopathy without gangrene: Secondary | ICD-10-CM | POA: Diagnosis present

## 2020-11-05 DIAGNOSIS — J309 Allergic rhinitis, unspecified: Secondary | ICD-10-CM | POA: Diagnosis not present

## 2020-11-05 DIAGNOSIS — I700 Atherosclerosis of aorta: Secondary | ICD-10-CM | POA: Diagnosis not present

## 2020-11-05 DIAGNOSIS — C801 Malignant (primary) neoplasm, unspecified: Secondary | ICD-10-CM | POA: Diagnosis not present

## 2020-11-05 DIAGNOSIS — G936 Cerebral edema: Secondary | ICD-10-CM | POA: Diagnosis not present

## 2020-11-05 DIAGNOSIS — Z800 Family history of malignant neoplasm of digestive organs: Secondary | ICD-10-CM | POA: Diagnosis not present

## 2020-11-05 DIAGNOSIS — Z79899 Other long term (current) drug therapy: Secondary | ICD-10-CM | POA: Diagnosis not present

## 2020-11-05 DIAGNOSIS — Z803 Family history of malignant neoplasm of breast: Secondary | ICD-10-CM | POA: Diagnosis not present

## 2020-11-05 DIAGNOSIS — C7931 Secondary malignant neoplasm of brain: Secondary | ICD-10-CM | POA: Diagnosis present

## 2020-11-05 DIAGNOSIS — Z9221 Personal history of antineoplastic chemotherapy: Secondary | ICD-10-CM | POA: Diagnosis not present

## 2020-11-05 DIAGNOSIS — E785 Hyperlipidemia, unspecified: Secondary | ICD-10-CM | POA: Diagnosis not present

## 2020-11-05 DIAGNOSIS — G9389 Other specified disorders of brain: Secondary | ICD-10-CM | POA: Diagnosis not present

## 2020-11-05 HISTORY — DX: Pneumonia, unspecified organism: J18.9

## 2020-11-05 HISTORY — DX: Personal history of other diseases of the digestive system: Z87.19

## 2020-11-05 HISTORY — DX: Peripheral vascular disease, unspecified: I73.9

## 2020-11-05 HISTORY — DX: Other specified postprocedural states: Z98.890

## 2020-11-05 HISTORY — DX: Other specified postprocedural states: R11.2

## 2020-11-05 LAB — CBC WITH DIFFERENTIAL/PLATELET
Abs Immature Granulocytes: 0.22 10*3/uL — ABNORMAL HIGH (ref 0.00–0.07)
Basophils Absolute: 0 10*3/uL (ref 0.0–0.1)
Basophils Relative: 0 %
Eosinophils Absolute: 0 10*3/uL (ref 0.0–0.5)
Eosinophils Relative: 0 %
HCT: 44.9 % (ref 36.0–46.0)
Hemoglobin: 15.2 g/dL — ABNORMAL HIGH (ref 12.0–15.0)
Immature Granulocytes: 2 %
Lymphocytes Relative: 13 %
Lymphs Abs: 1.6 10*3/uL (ref 0.7–4.0)
MCH: 31.4 pg (ref 26.0–34.0)
MCHC: 33.9 g/dL (ref 30.0–36.0)
MCV: 92.8 fL (ref 80.0–100.0)
Monocytes Absolute: 1 10*3/uL (ref 0.1–1.0)
Monocytes Relative: 8 %
Neutro Abs: 9.6 10*3/uL — ABNORMAL HIGH (ref 1.7–7.7)
Neutrophils Relative %: 77 %
Platelets: 318 10*3/uL (ref 150–400)
RBC: 4.84 MIL/uL (ref 3.87–5.11)
RDW: 12.5 % (ref 11.5–15.5)
WBC: 12.5 10*3/uL — ABNORMAL HIGH (ref 4.0–10.5)
nRBC: 0 % (ref 0.0–0.2)

## 2020-11-05 LAB — BASIC METABOLIC PANEL
Anion gap: 13 (ref 5–15)
BUN: 29 mg/dL — ABNORMAL HIGH (ref 8–23)
CO2: 21 mmol/L — ABNORMAL LOW (ref 22–32)
Calcium: 9.8 mg/dL (ref 8.9–10.3)
Chloride: 101 mmol/L (ref 98–111)
Creatinine, Ser: 0.9 mg/dL (ref 0.44–1.00)
GFR, Estimated: >60 mL/min (ref >60)
Glucose, Bld: 169 mg/dL — ABNORMAL HIGH (ref 70–99)
Potassium: 4 mmol/L (ref 3.5–5.1)
Sodium: 135 mmol/L (ref 135–145)

## 2020-11-05 LAB — SARS CORONAVIRUS 2 (TAT 6-24 HRS): SARS Coronavirus 2: NEGATIVE

## 2020-11-05 LAB — SURGICAL PCR SCREEN
MRSA, PCR: NEGATIVE
Staphylococcus aureus: NEGATIVE

## 2020-11-05 LAB — GLUCOSE, CAPILLARY: Glucose-Capillary: 188 mg/dL — ABNORMAL HIGH (ref 70–99)

## 2020-11-05 NOTE — Progress Notes (Addendum)
PCP - Amy Bedsole Cardiologist - denies Oncologist: Dr. Julien Nordmann  PPM/ICD - denies   Chest x-ray - n/a  EKG - 11/05/20 Stress Test - denies ECHO - denies Cardiac Cath - denies  Sleep Study - denies   Pre diabetes, diet controlled   As of today, STOP taking any Aspirin (unless otherwise instructed by your surgeon) Aleve, Naproxen, Ibuprofen, Motrin, Advil, Goody's, BC's, all herbal medications, fish oil, and all vitamins.  ERAS Protcol -no   COVID TEST- completed in PAT 11/05/20   Anesthesia review: yes, hr elevated in pat, [facility] pa-c notified during appt  Patient denies shortness of breath, fever, cough and chest pain at PAT appointment   All instructions explained to the patient, with a verbal understanding of the material. Patient agrees to go over the instructions while at home for a better understanding. Patient also instructed to self quarantine after being tested for COVID-19. The opportunity to ask questions was provided.

## 2020-11-05 NOTE — Progress Notes (Signed)
Anesthesia Chart Review:  Case: [id number] Date/Time: 11/07/20 1256   Procedures:      Craniotomy - right - Parietal - Tumor (Right)     APPLICATION OF CRANIAL NAVIGATION (Right)   Anesthesia type: General   Pre-op diagnosis: Brain tumor   Location: [facility] OR ROOM 21 / [place] OR   Surgeons: Earnie Larsson, MD       DISCUSSION: Patient is a 75 year old female scheduled for the above procedure.  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  Last visit with [name] 10/29/20 for left lateral thigh pain and tingling for the past several weeks.  She has necessarily been given a trial of amitriptyline for possible meralgia paresthetica, but pain progressed and now reported weakness in her left leg and arm.  Also reported 4 to 5-day history of short-lived pain in her right head.  She was started on aspirin and brain MRI ordered to evaluate for possible brain metastasis or CVA. 11/01/20 MRI showed a solitary enhancing lesion over the right frontal convexity measuring 2.5 x 2.9 x 2.8 cm with prominent surrounding vasogenic edema spread throughout the right frontal lobe and felt to most likely represent a meningioma with dural metastasis less likely. She was referred to neurosurgery and started on dexamethasone   Chest scans were stable as of 05/13/20 with plan for repeat imaging in six months per 05/14/20 visit with Dr. Julien Nordmann.   She has had at least mild tachycardia at [facility] visits over the past year with range of 106-124 bpm. She does get some palpitations, but denied SOB and chest pain. EKG showed ST, non-specific T wave abnormality at 108 bpm. Thyroid  panel normal on 10/11/20. She is on steroids now which could be exacerbating tachycardia, but rate is similar to what is recorded even prior to her being on Decadron. She is not on a b-blocker. ST appears to be a relatively chronic issue over the past year and mentioned in prior PCP notes. Continue PCP follow-up postoperatively.   Presurgical COVID-19 test is negative on 11/05/20.  Anesthesia team to evaluate on the day of surgery   VS: BP 133/72   Pulse (!) 115   Temp 37.3 C (Oral)   Resp 18   Ht 4\' 11"  (1.499 m)   Wt 60.9 kg   SpO2 97%   BMI 27.13 kg/m   11/06/20: HR 115. 108 bpm on EKG.  10/29/20: HR 124 10/15/20: HR 106 05/14/20: HR 115 11/02/19: HR 106 10/13/19: HR 88 09/06/19: HR 95 07/03/19: HR 101   PROVIDERS: Bedsole, Amy E, MD is PCP. Last evaluation 10/31/20.  Curt Bears, MD is HEM-ONC. Last visit 05/14/20.  Kyung Rudd, MD is RAD-ONC - Laverle Hobby, MD is pulmonologist. Last visit 05/28/17 with ENB by Flora Lipps, MD on 06/08/17. Bernardo Heater, Nicki Reaper, MD is urologist. No evidence of recurrent bladder tumor on recent cystoscopy 08/15/20.  Renato Shin, MD is endocrinologist. He saw her once on 09/06/19 for multi-nodular goiter, felt low risk for malignancy and already getting serial CT scans for lung cancer. He felt her goiter on these scans by oncology, and she could be referred back if concern for enlargement. PCP could continue to monitor thyroid labs.  LABS: Labs reviewed: Acceptable for surgery.  A1c 5.9% 10/11/20.  (all labs ordered are listed, but only abnormal results are displayed)  Labs Reviewed  GLUCOSE, CAPILLARY - Abnormal; Notable for the following components:      Result Value   Glucose-Capillary 188 (*)    All other components within normal limits  CBC WITH DIFFERENTIAL/PLATELET - Abnormal; Notable for the following components:   WBC 12.5 (*)    Hemoglobin 15.2 (*)    Neutro Abs 9.6 (*)    Abs Immature Granulocytes 0.22 (*)    All other  components within normal limits  BASIC METABOLIC PANEL - Abnormal; Notable for the following components:   CO2 21 (*)    Glucose, Bld 169 (*)    BUN 29 (*)    All other components within normal limits  SURGICAL PCR SCREEN  SARS CORONAVIRUS 2 (TAT 6-24 HRS)  TYPE AND SCREEN     IMAGES: MRI Brain 11/01/20: IMPRESSION: 1. Solitary enhancing lesion over the right frontal convexity measures 2.5 x 2.9 x 2.8 cm with prominent surrounding vasogenic edema spread throughout the right frontal lobe. The lesion appears to be extra-axial with a clear dural component and mild restricted diffusion. 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 Dural metastasis is considered less likely. 2. Focal area of T1 shortening near the superior aspect of the lesion may represent hemorrhage. 3. No other evidence for metastatic disease to the brain.   CT Chest 05/13/20: IMPRESSION: 1. Unchanged post treatment appearance of the right lung with post radiation perihilar and paramedian consolidation and fibrosis. No evidence of malignant recurrence. 2. Stable small solid and ground-glass pulmonary nodules. Attention on follow-up. 3. 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 Attention on follow-up. 4. Emphysema. 5. Coronary artery disease. - Aortic Atherosclerosis (ICD10-I70.0) and Emphysema (ICD10-J43.9).   EKG: 11/05/20: Sinus tachycardia at 108 bpm Nonspecific T wave abnormality Abnormal ECG Since last tracing HEART RATE has increased Nonspecific T wave abnormality is now Present Confirmed by End, Harrell Gave [phone]) on 11/05/2020 10:05:40 PM   CV: N/A  Past Medical History:  Diagnosis Date   Allergic rhinitis    Arthritis    Diabetes mellitus without complication (HCC)    diet control/no meds per pt   Dyspnea    Essential hypertension 04/11/2019   History of chicken pox    History  of hiatal hernia    lung ca dx'd 04/2017   Peripheral vascular disease (HCC)    Pneumonia    PONV (postoperative nausea and vomiting)    Smoker     Past Surgical History:  Procedure Laterality Date   ANKLE FRACTURE SURGERY Left 10 years ago   "hard to wake up from anesthesia" per pt   [place] Right 1978   BENIGN CYST   BREAST EXCISIONAL BIOPSY Left 2011   Benign   BREAST EXCISIONAL BIOPSY Right 1985   Benign    CATARACT EXTRACTION     CATARACT EXTRACTION W/ INTRAOCULAR LENS IMPLANT Bilateral    COLONOSCOPY  2011   ELECTROMAGNETIC NAVIGATION BROCHOSCOPY N/A 06/08/2017   Procedure: ELECTROMAGNETIC NAVIGATION BRONCHOSCOPY;  Surgeon: Flora Lipps, MD;  Location: [facility] ORS;  Service: Cardiopulmonary;  Laterality: N/A;   FRACTURE SURGERY     POLYPECTOMY  2011   TRANSURETHRAL RESECTION OF BLADDER TUMOR WITH MITOMYCIN-C N/A 05/04/2017   Procedure: TRANSURETHRAL RESECTION OF BLADDER TUMOR WITH gemcitabine;  Surgeon: Abbie Sons, MD;  Location: [facility] ORS;  Service: Urology;  Laterality: N/A;   TUBAL LIGATION      MEDICATIONS:  acetaminophen (TYLENOL) 500 MG tablet   amitriptyline (ELAVIL) 50 MG tablet   atorvastatin (LIPITOR) 40 MG tablet   Calcium Citrate-Vitamin D (CITRACAL + D PO)   cholecalciferol (VITAMIN D) 25 MCG (1000 UNIT) tablet   dexamethasone (DECADRON) 4 MG tablet   lisinopril (ZESTRIL) 2.5 MG tablet   Multiple Vitamin (MULTIVITAMIN) capsule   vitamin B-12 (CYANOCOBALAMIN) 1000 MCG tablet   No current facility-administered medications for this encounter.    Myra Gianotti, PA-C Surgical Short Stay/Anesthesiology [facility] Phone [phone] [facility] Phone [phone] 11/06/2020 11:46 AM

## 2020-11-06 NOTE — Anesthesia Preprocedure Evaluation (Addendum)
Anesthesia Evaluation  Patient identified by MRN, date of birth, ID band Patient awake    Reviewed: Allergy & Precautions, NPO status , Patient's Chart, lab work & pertinent test results  History of Anesthesia Complications (+) PONV  Airway Mallampati: I  TM Distance: >3 FB Neck ROM: Full    Dental  (+) Dental Advisory Given, Edentulous Upper   Pulmonary COPD, Patient abstained from smoking., former smoker,  11/05/2020 SARS coronavirus NEG '19 lung cancer   breath sounds clear to auscultation       Cardiovascular hypertension, Pt. on medications (-) angina+ Peripheral Vascular Disease   Rhythm:Regular Rate:Normal     Neuro/Psych R parietal brain tumor    GI/Hepatic Neg liver ROS, hiatal hernia, neg GERD  ,  Endo/Other  diabetes (diet controlled, glu 168)  Renal/GU negative Renal ROS     Musculoskeletal  (+) Arthritis ,   Abdominal   Peds  Hematology negative hematology ROS (+)   Anesthesia Other Findings   Reproductive/Obstetrics                           Anesthesia Physical Anesthesia Plan  ASA: 3  Anesthesia Plan: General   Post-op Pain Management:    Induction: Intravenous  PONV Risk Score and Plan: 4 or greater and Ondansetron, Dexamethasone and Treatment may vary due to age or medical condition  Airway Management Planned: Oral ETT  Additional Equipment: Arterial line  Intra-op Plan:   Post-operative Plan: Extubation in OR  Informed Consent: I have reviewed the patients History and Physical, chart, labs and discussed the procedure including the risks, benefits and alternatives for the proposed anesthesia with the patient or authorized representative who has indicated his/her understanding and acceptance.     Dental advisory given  Plan Discussed with: CRNA and Surgeon  Anesthesia Plan Comments: (PAT note written 11/06/2020 by Myra Gianotti, PA-C. )       Anesthesia Quick Evaluation

## 2020-11-07 ENCOUNTER — Inpatient Hospital Stay (HOSPITAL_COMMUNITY): Payer: PPO

## 2020-11-07 ENCOUNTER — Inpatient Hospital Stay (HOSPITAL_COMMUNITY): Payer: PPO | Admitting: Vascular Surgery

## 2020-11-07 ENCOUNTER — Encounter (HOSPITAL_COMMUNITY): Payer: Self-pay | Admitting: Neurosurgery

## 2020-11-07 ENCOUNTER — Inpatient Hospital Stay (HOSPITAL_COMMUNITY)
Admission: RE | Admit: 2020-11-07 | Discharge: 2020-11-08 | DRG: 027 | Disposition: A | Discharge status: Home Health | Payer: PPO | Attending: Neurosurgery | Admitting: Neurosurgery

## 2020-11-07 ENCOUNTER — Encounter (HOSPITAL_COMMUNITY)
Admission: RE | Disposition: A | Discharge status: Home / Self Care | Payer: Self-pay | Source: Home / Self Care | Attending: Neurosurgery

## 2020-11-07 DIAGNOSIS — Z923 Personal history of irradiation: Secondary | ICD-10-CM

## 2020-11-07 DIAGNOSIS — D496 Neoplasm of unspecified behavior of brain: Secondary | ICD-10-CM | POA: Diagnosis present

## 2020-11-07 DIAGNOSIS — Z20822 Contact with and (suspected) exposure to covid-19: Secondary | ICD-10-CM | POA: Diagnosis present

## 2020-11-07 DIAGNOSIS — C7931 Secondary malignant neoplasm of brain: Principal | ICD-10-CM | POA: Diagnosis present

## 2020-11-07 DIAGNOSIS — Z9221 Personal history of antineoplastic chemotherapy: Secondary | ICD-10-CM

## 2020-11-07 DIAGNOSIS — Z803 Family history of malignant neoplasm of breast: Secondary | ICD-10-CM | POA: Diagnosis not present

## 2020-11-07 DIAGNOSIS — Z87891 Personal history of nicotine dependence: Secondary | ICD-10-CM | POA: Diagnosis not present

## 2020-11-07 DIAGNOSIS — E1151 Type 2 diabetes mellitus with diabetic peripheral angiopathy without gangrene: Secondary | ICD-10-CM | POA: Diagnosis present

## 2020-11-07 DIAGNOSIS — I10 Essential (primary) hypertension: Secondary | ICD-10-CM | POA: Diagnosis present

## 2020-11-07 DIAGNOSIS — Z833 Family history of diabetes mellitus: Secondary | ICD-10-CM | POA: Diagnosis not present

## 2020-11-07 DIAGNOSIS — Z85118 Personal history of other malignant neoplasm of bronchus and lung: Secondary | ICD-10-CM | POA: Diagnosis not present

## 2020-11-07 DIAGNOSIS — Z800 Family history of malignant neoplasm of digestive organs: Secondary | ICD-10-CM

## 2020-11-07 DIAGNOSIS — Z79899 Other long term (current) drug therapy: Secondary | ICD-10-CM | POA: Diagnosis not present

## 2020-11-07 HISTORY — PX: CRANIOTOMY: SHX93

## 2020-11-07 HISTORY — PX: APPLICATION OF CRANIAL NAVIGATION: SHX6578

## 2020-11-07 LAB — GLUCOSE, CAPILLARY
Glucose-Capillary: 168 mg/dL — ABNORMAL HIGH (ref 70–99)
Glucose-Capillary: 133 mg/dL — ABNORMAL HIGH (ref 70–99)
Glucose-Capillary: 292 mg/dL — ABNORMAL HIGH (ref 70–99)

## 2020-11-07 LAB — ABO/RH: ABO/RH(D): B POS

## 2020-11-07 LAB — PREPARE RBC (CROSSMATCH)

## 2020-11-07 SURGERY — CRANIOTOMY TUMOR EXCISION
Anesthesia: General | Site: Head | Laterality: Right

## 2020-11-07 MED ORDER — ESMOLOL HCL 100 MG/10ML IV SOLN
INTRAVENOUS | Status: AC
  Filled 2020-11-07: qty 10

## 2020-11-07 MED ORDER — DEXAMETHASONE SODIUM PHOSPHATE 4 MG/ML IJ SOLN
4.0000 mg | Freq: Four times a day (QID) | INTRAMUSCULAR | Status: DC
  Filled 2020-11-07: qty 1

## 2020-11-07 MED ORDER — SODIUM CHLORIDE 0.9 % IR SOLN
Status: DC | PRN
  Administered 2020-11-07: 3000 mL

## 2020-11-07 MED ORDER — BACITRACIN ZINC 500 UNIT/GM EX OINT
TOPICAL_OINTMENT | CUTANEOUS | Status: DC | PRN
  Administered 2020-11-07: 1 via TOPICAL

## 2020-11-07 MED ORDER — DEXAMETHASONE SODIUM PHOSPHATE 10 MG/ML IJ SOLN
6.0000 mg | Freq: Four times a day (QID) | INTRAMUSCULAR | Status: AC
  Administered 2020-11-07 – 2020-11-08 (×4): 6 mg via INTRAVENOUS
  Filled 2020-11-07 (×4): qty 1

## 2020-11-07 MED ORDER — AMITRIPTYLINE HCL 25 MG PO TABS
50.0000 mg | ORAL_TABLET | Freq: Every day | ORAL | Status: DC
  Administered 2020-11-07: 50 mg via ORAL
  Filled 2020-11-07: qty 2

## 2020-11-07 MED ORDER — SODIUM CHLORIDE 0.9% IV SOLUTION: Freq: Once | INTRAVENOUS | Status: DC

## 2020-11-07 MED ORDER — LABETALOL HCL 5 MG/ML IV SOLN
INTRAVENOUS | Status: AC
  Filled 2020-11-07: qty 4

## 2020-11-07 MED ORDER — ATORVASTATIN CALCIUM 40 MG PO TABS
40.0000 mg | ORAL_TABLET | Freq: Every day | ORAL | Status: DC
  Administered 2020-11-08: 40 mg via ORAL
  Filled 2020-11-07: qty 1

## 2020-11-07 MED ORDER — INSULIN ASPART 100 UNIT/ML IJ SOLN
0.0000 [IU] | Freq: Three times a day (TID) | INTRAMUSCULAR | Status: DC
Start: 2020-11-08 — End: 2020-11-08
  Administered 2020-11-08 (×2): 3 [IU] via SUBCUTANEOUS
  Administered 2020-11-08: 5 [IU] via SUBCUTANEOUS

## 2020-11-07 MED ORDER — LIDOCAINE-EPINEPHRINE 1 %-1:100000 IJ SOLN
INTRAMUSCULAR | Status: DC | PRN
  Administered 2020-11-07: 10 mL

## 2020-11-07 MED ORDER — IPRATROPIUM-ALBUTEROL 0.5-2.5 (3) MG/3ML IN SOLN
3.0000 mL | RESPIRATORY_TRACT | Status: DC
  Administered 2020-11-07: 3 mL via RESPIRATORY_TRACT

## 2020-11-07 MED ORDER — ADULT MULTIVITAMIN W/MINERALS CH
1.0000 | ORAL_TABLET | Freq: Every day | ORAL | Status: DC
  Administered 2020-11-07 – 2020-11-08 (×2): 1 via ORAL
  Filled 2020-11-07 (×2): qty 1

## 2020-11-07 MED ORDER — FENTANYL CITRATE (PF) 250 MCG/5ML IJ SOLN
INTRAMUSCULAR | Status: AC
  Filled 2020-11-07: qty 5

## 2020-11-07 MED ORDER — CEFAZOLIN SODIUM-DEXTROSE 2-4 GM/100ML-% IV SOLN
2.0000 g | Freq: Three times a day (TID) | INTRAVENOUS | Status: AC
  Administered 2020-11-07 – 2020-11-08 (×2): 2 g via INTRAVENOUS
  Filled 2020-11-07 (×2): qty 100

## 2020-11-07 MED ORDER — LISINOPRIL 2.5 MG PO TABS
2.5000 mg | ORAL_TABLET | Freq: Every day | ORAL | Status: DC
  Administered 2020-11-07 – 2020-11-08 (×2): 2.5 mg via ORAL
  Filled 2020-11-07 (×2): qty 1

## 2020-11-07 MED ORDER — LIDOCAINE 2% (20 MG/ML) 5 ML SYRINGE
INTRAMUSCULAR | Status: AC
  Filled 2020-11-07: qty 5

## 2020-11-07 MED ORDER — SODIUM CHLORIDE 0.9 % IV SOLN: INTRAVENOUS | Status: DC | PRN

## 2020-11-07 MED ORDER — LABETALOL HCL 5 MG/ML IV SOLN: 10.0000 mg | INTRAVENOUS | Status: DC | PRN

## 2020-11-07 MED ORDER — LIDOCAINE-EPINEPHRINE 1 %-1:100000 IJ SOLN
INTRAMUSCULAR | Status: AC
  Filled 2020-11-07: qty 1

## 2020-11-07 MED ORDER — FENTANYL CITRATE (PF) 250 MCG/5ML IJ SOLN
INTRAMUSCULAR | Status: DC | PRN
  Administered 2020-11-07: 500 ug via INTRAVENOUS

## 2020-11-07 MED ORDER — ONDANSETRON HCL 4 MG/2ML IJ SOLN: 4.0000 mg | INTRAMUSCULAR | Status: DC | PRN

## 2020-11-07 MED ORDER — LACTATED RINGERS IV SOLN: INTRAVENOUS | Status: DC

## 2020-11-07 MED ORDER — ACETAMINOPHEN 325 MG PO TABS: 650.0000 mg | ORAL_TABLET | ORAL | Status: DC | PRN

## 2020-11-07 MED ORDER — BACITRACIN ZINC 500 UNIT/GM EX OINT
TOPICAL_OINTMENT | CUTANEOUS | Status: AC
  Filled 2020-11-07: qty 28.35

## 2020-11-07 MED ORDER — CHLORHEXIDINE GLUCONATE 0.12 % MT SOLN
OROMUCOSAL | Status: AC
  Administered 2020-11-07: 15 mL via OROMUCOSAL
  Filled 2020-11-07: qty 15

## 2020-11-07 MED ORDER — DEXAMETHASONE SODIUM PHOSPHATE 10 MG/ML IJ SOLN
10.0000 mg | Freq: Once | INTRAMUSCULAR | Status: AC
  Administered 2020-11-07: 10 mg via INTRAVENOUS

## 2020-11-07 MED ORDER — VITAMIN D3 25 MCG (1000 UNIT) PO TABS
1000.0000 [IU] | ORAL_TABLET | Freq: Every day | ORAL | Status: DC
  Administered 2020-11-07 – 2020-11-08 (×2): 1000 [IU] via ORAL
  Filled 2020-11-07 (×4): qty 1

## 2020-11-07 MED ORDER — ROCURONIUM BROMIDE 10 MG/ML (PF) SYRINGE
PREFILLED_SYRINGE | INTRAVENOUS | Status: AC
  Filled 2020-11-07: qty 20

## 2020-11-07 MED ORDER — THROMBIN 20000 UNITS EX SOLR
CUTANEOUS | Status: DC | PRN
  Administered 2020-11-07: 20 mL via TOPICAL

## 2020-11-07 MED ORDER — NALOXONE HCL 0.4 MG/ML IJ SOLN: 0.0800 mg | INTRAMUSCULAR | Status: DC | PRN

## 2020-11-07 MED ORDER — HYDROMORPHONE HCL 1 MG/ML IJ SOLN: 0.5000 mg | INTRAMUSCULAR | Status: DC | PRN

## 2020-11-07 MED ORDER — PHENYLEPHRINE HCL-NACL 10-0.9 MG/250ML-% IV SOLN
INTRAVENOUS | Status: DC | PRN
  Administered 2020-11-07: 25 ug/min via INTRAVENOUS

## 2020-11-07 MED ORDER — VITAMIN B-12 1000 MCG PO TABS
1000.0000 ug | ORAL_TABLET | Freq: Every day | ORAL | Status: DC
  Administered 2020-11-07 – 2020-11-08 (×2): 1000 ug via ORAL
  Filled 2020-11-07 (×2): qty 1

## 2020-11-07 MED ORDER — LIDOCAINE 2% (20 MG/ML) 5 ML SYRINGE
INTRAMUSCULAR | Status: DC | PRN
  Administered 2020-11-07: 20 mg via INTRAVENOUS

## 2020-11-07 MED ORDER — SUGAMMADEX SODIUM 200 MG/2ML IV SOLN
INTRAVENOUS | Status: DC | PRN
  Administered 2020-11-07: 200 mg via INTRAVENOUS

## 2020-11-07 MED ORDER — PHENYLEPHRINE 40 MCG/ML (10ML) SYRINGE FOR IV PUSH (FOR BLOOD PRESSURE SUPPORT)
PREFILLED_SYRINGE | INTRAVENOUS | Status: AC
  Filled 2020-11-07: qty 10

## 2020-11-07 MED ORDER — OXYCODONE HCL 5 MG PO TABS
5.0000 mg | ORAL_TABLET | Freq: Once | ORAL | Status: DC | PRN
Start: 2020-11-07 — End: 2020-11-07

## 2020-11-07 MED ORDER — LEVETIRACETAM IN NACL 500 MG/100ML IV SOLN
500.0000 mg | Freq: Two times a day (BID) | INTRAVENOUS | Status: DC
  Administered 2020-11-07 – 2020-11-08 (×2): 500 mg via INTRAVENOUS
  Filled 2020-11-07 (×2): qty 100

## 2020-11-07 MED ORDER — CHLORHEXIDINE GLUCONATE CLOTH 2 % EX PADS: 6.0000 | MEDICATED_PAD | Freq: Once | CUTANEOUS | Status: DC

## 2020-11-07 MED ORDER — HYDROMORPHONE HCL 1 MG/ML IJ SOLN: 0.2500 mg | INTRAMUSCULAR | Status: DC | PRN

## 2020-11-07 MED ORDER — ONDANSETRON HCL 4 MG/2ML IJ SOLN
INTRAMUSCULAR | Status: DC | PRN
  Administered 2020-11-07: 4 mg via INTRAVENOUS

## 2020-11-07 MED ORDER — ONDANSETRON HCL 4 MG PO TABS: 4.0000 mg | ORAL_TABLET | ORAL | Status: DC | PRN

## 2020-11-07 MED ORDER — ROCURONIUM BROMIDE 10 MG/ML (PF) SYRINGE
PREFILLED_SYRINGE | INTRAVENOUS | Status: DC | PRN
  Administered 2020-11-07: 60 mg via INTRAVENOUS

## 2020-11-07 MED ORDER — PANTOPRAZOLE SODIUM 40 MG IV SOLR
40.0000 mg | Freq: Every day | INTRAVENOUS | Status: DC
  Administered 2020-11-07: 40 mg via INTRAVENOUS
  Filled 2020-11-07: qty 40

## 2020-11-07 MED ORDER — DEXAMETHASONE SODIUM PHOSPHATE 10 MG/ML IJ SOLN
INTRAMUSCULAR | Status: AC
  Filled 2020-11-07: qty 1

## 2020-11-07 MED ORDER — CEFAZOLIN SODIUM-DEXTROSE 2-4 GM/100ML-% IV SOLN
2.0000 g | INTRAVENOUS | Status: AC
  Administered 2020-11-07: 2 g via INTRAVENOUS

## 2020-11-07 MED ORDER — HEMOSTATIC AGENTS (NO CHARGE) OPTIME
TOPICAL | Status: DC | PRN
  Administered 2020-11-07: 1 via TOPICAL

## 2020-11-07 MED ORDER — MIDAZOLAM HCL 2 MG/2ML IJ SOLN: 0.5000 mg | Freq: Once | INTRAMUSCULAR | Status: DC | PRN

## 2020-11-07 MED ORDER — THROMBIN 20000 UNITS EX SOLR
CUTANEOUS | Status: AC
  Filled 2020-11-07: qty 20000

## 2020-11-07 MED ORDER — IPRATROPIUM-ALBUTEROL 0.5-2.5 (3) MG/3ML IN SOLN
RESPIRATORY_TRACT | Status: AC
  Filled 2020-11-07: qty 3

## 2020-11-07 MED ORDER — DEXAMETHASONE SODIUM PHOSPHATE 4 MG/ML IJ SOLN: 4.0000 mg | Freq: Three times a day (TID) | INTRAMUSCULAR | Status: DC

## 2020-11-07 MED ORDER — ACETAMINOPHEN 650 MG RE SUPP: 650.0000 mg | RECTAL | Status: DC | PRN

## 2020-11-07 MED ORDER — HYDROCODONE-ACETAMINOPHEN 5-325 MG PO TABS
1.0000 | ORAL_TABLET | ORAL | Status: DC | PRN
  Administered 2020-11-08: 1 via ORAL
  Filled 2020-11-07: qty 1

## 2020-11-07 MED ORDER — PROPOFOL 10 MG/ML IV BOLUS
INTRAVENOUS | Status: DC | PRN
  Administered 2020-11-07: 60 mg via INTRAVENOUS
  Administered 2020-11-07: 20 mg via INTRAVENOUS
  Administered 2020-11-07: 60 mg via INTRAVENOUS

## 2020-11-07 MED ORDER — THROMBIN 5000 UNITS EX SOLR
CUTANEOUS | Status: AC
  Filled 2020-11-07: qty 5000

## 2020-11-07 MED ORDER — CEFAZOLIN SODIUM-DEXTROSE 2-4 GM/100ML-% IV SOLN
INTRAVENOUS | Status: AC
  Filled 2020-11-07: qty 100

## 2020-11-07 MED ORDER — SODIUM CHLORIDE 0.9 % IV SOLN: INTRAVENOUS | Status: DC

## 2020-11-07 MED ORDER — CHLORHEXIDINE GLUCONATE 0.12 % MT SOLN: 15.0000 mL | Freq: Once | OROMUCOSAL | Status: AC

## 2020-11-07 MED ORDER — PROMETHAZINE HCL 25 MG PO TABS: 12.5000 mg | ORAL_TABLET | ORAL | Status: DC | PRN

## 2020-11-07 MED ORDER — INSULIN ASPART 100 UNIT/ML IJ SOLN: 0.0000 [IU] | Freq: Every day | INTRAMUSCULAR | Status: DC

## 2020-11-07 MED ORDER — ORAL CARE MOUTH RINSE: 15.0000 mL | Freq: Once | OROMUCOSAL | Status: AC

## 2020-11-07 MED ORDER — PROMETHAZINE HCL 25 MG/ML IJ SOLN: 6.2500 mg | INTRAMUSCULAR | Status: DC | PRN

## 2020-11-07 MED ORDER — ONDANSETRON HCL 4 MG/2ML IJ SOLN
INTRAMUSCULAR | Status: AC
  Filled 2020-11-07: qty 2

## 2020-11-07 MED ORDER — OXYCODONE HCL 5 MG/5ML PO SOLN: 5.0000 mg | Freq: Once | ORAL | Status: DC | PRN

## 2020-11-07 SURGICAL SUPPLY — 60 items
BAG COUNTER SPONGE SURGICOUNT (BAG) ×3 IMPLANT
BAG DECANTER FOR FLEXI CONT (MISCELLANEOUS) ×3 IMPLANT
BAND RUBBER #18 3X1/16 STRL (MISCELLANEOUS) IMPLANT
BLADE CLIPPER SURG (BLADE) ×3 IMPLANT
BNDG COHESIVE 4X5 TAN STRL (GAUZE/BANDAGES/DRESSINGS) ×3 IMPLANT
BUR ACORN 6.0 PRECISION (BURR) ×3 IMPLANT
BUR SPIRAL ROUTER 2.3 (BUR) IMPLANT
CANISTER SUCT 3000ML PPV (MISCELLANEOUS) ×6 IMPLANT
CARTRIDGE OIL MAESTRO DRILL (MISCELLANEOUS) ×2 IMPLANT
CLIP VESOCCLUDE MED 6/CT (CLIP) ×3 IMPLANT
CNTNR URN SCR LID CUP LEK RST (MISCELLANEOUS) ×2 IMPLANT
CONT SPEC 4OZ STRL OR WHT (MISCELLANEOUS) ×3
COVER BURR HOLE 7 (Orthopedic Implant) ×3 IMPLANT
DIFFUSER DRILL AIR PNEUMATIC (MISCELLANEOUS) ×3 IMPLANT
DRAPE CAMERA VIDEO/LASER (DRAPES) IMPLANT
DRAPE MICROSCOPE LEICA (MISCELLANEOUS) ×3 IMPLANT
DRAPE NEUROLOGICAL W/INCISE (DRAPES) ×3 IMPLANT
DRAPE STERI IOBAN 125X83 (DRAPES) IMPLANT
DRAPE SURG 17X23 STRL (DRAPES) IMPLANT
DRAPE WARM FLUID 44X44 (DRAPES) ×3 IMPLANT
ELECT CAUTERY BLADE 6.4 (BLADE) ×3 IMPLANT
ELECT REM PT RETURN 9FT ADLT (ELECTROSURGICAL) ×3
ELECTRODE REM PT RTRN 9FT ADLT (ELECTROSURGICAL) ×2 IMPLANT
GAUZE 4X4 16PLY ~~LOC~~+RFID DBL (SPONGE) IMPLANT
GAUZE SPONGE 4X4 12PLY STRL (GAUZE/BANDAGES/DRESSINGS) ×3 IMPLANT
GLOVE EXAM NITRILE XL STR (GLOVE) IMPLANT
GLOVE SURG LTX SZ9 (GLOVE) ×3 IMPLANT
GOWN STRL REUS W/ TWL LRG LVL3 (GOWN DISPOSABLE) IMPLANT
GOWN STRL REUS W/ TWL XL LVL3 (GOWN DISPOSABLE) IMPLANT
GOWN STRL REUS W/TWL 2XL LVL3 (GOWN DISPOSABLE) IMPLANT
GOWN STRL REUS W/TWL LRG LVL3 (GOWN DISPOSABLE)
GOWN STRL REUS W/TWL XL LVL3 (GOWN DISPOSABLE)
HEMOSTAT SURGICEL 2X14 (HEMOSTASIS) ×3 IMPLANT
KIT BASIN OR (CUSTOM PROCEDURE TRAY) ×3 IMPLANT
KIT TURNOVER KIT B (KITS) ×3 IMPLANT
MARKER SPHERE PSV REFLC 13MM (MARKER) ×6 IMPLANT
NEEDLE HYPO 18GX1.5 BLUNT FILL (NEEDLE) IMPLANT
NEEDLE HYPO 25X1 1.5 SAFETY (NEEDLE) ×3 IMPLANT
NS IRRIG 1000ML POUR BTL (IV SOLUTION) ×6 IMPLANT
OIL CARTRIDGE MAESTRO DRILL (MISCELLANEOUS) ×3
PACK CRANIOTOMY CUSTOM (CUSTOM PROCEDURE TRAY) ×3 IMPLANT
PAD ARMBOARD 7.5X6 YLW CONV (MISCELLANEOUS) ×9 IMPLANT
PATTIES SURGICAL .25X.25 (GAUZE/BANDAGES/DRESSINGS) IMPLANT
PATTIES SURGICAL .5 X.5 (GAUZE/BANDAGES/DRESSINGS) IMPLANT
PATTIES SURGICAL .5 X3 (DISPOSABLE) IMPLANT
PATTIES SURGICAL 1X1 (DISPOSABLE) IMPLANT
PLATE CRANIAL 12 2H RIGID UNI (Plate) ×6 IMPLANT
SCREW UNIII AXS SD 1.5X4 (Screw) ×21 IMPLANT
SPONGE NEURO XRAY DETECT 1X3 (DISPOSABLE) IMPLANT
SPONGE SURGIFOAM ABS GEL 100 (HEMOSTASIS) ×3 IMPLANT
STAPLER VISISTAT 35W (STAPLE) ×3 IMPLANT
STOCKINETTE TUBULAR 6 INCH (GAUZE/BANDAGES/DRESSINGS) ×3 IMPLANT
SUT NURALON 4 0 TR CR/8 (SUTURE) ×9 IMPLANT
SUT VIC AB 2-0 CT2 18 VCP726D (SUTURE) ×6 IMPLANT
SYR CONTROL 10ML LL (SYRINGE) ×3 IMPLANT
TOWEL GREEN STERILE (TOWEL DISPOSABLE) ×3 IMPLANT
TOWEL GREEN STERILE FF (TOWEL DISPOSABLE) ×3 IMPLANT
TRAY FOLEY MTR SLVR 16FR STAT (SET/KITS/TRAYS/PACK) ×3 IMPLANT
UNDERPAD 30X36 HEAVY ABSORB (UNDERPADS AND DIAPERS) ×3 IMPLANT
WATER STERILE IRR 1000ML POUR (IV SOLUTION) ×3 IMPLANT

## 2020-11-07 NOTE — Anesthesia Procedure Notes (Addendum)
Arterial Line Insertion Start/End7/28/2022 11:44 PM, 11/07/2020 11:50 PM Performed by: Glynda Jaeger, CRNA, CRNA  Preanesthetic checklist: patient identified, IV checked, site marked, risks and benefits discussed, surgical consent, monitors and equipment checked, pre-op evaluation, timeout performed and anesthesia consent Left, radial was placed Catheter size: 20 G  Attempts: 1 Procedure performed without using ultrasound guided technique. Following insertion, dressing applied and Biopatch. Post procedure assessment: normal  Patient tolerated the procedure well with no immediate complications.

## 2020-11-07 NOTE — Anesthesia Postprocedure Evaluation (Signed)
Anesthesia Post Note  Patient: Katelyn Kennedy  Procedure(s) Performed: Right Sided Craniotomy for Tumor (Right: Head) APPLICATION OF CRANIAL NAVIGATION (Right)     Patient location during evaluation: PACU Anesthesia Type: General Level of consciousness: awake and alert, patient cooperative and oriented Pain management: pain level controlled Vital Signs Assessment: post-procedure vital signs reviewed and stable Respiratory status: spontaneous breathing, nonlabored ventilation, respiratory function stable and patient connected to nasal cannula oxygen Cardiovascular status: blood pressure returned to baseline and stable Postop Assessment: no apparent nausea or vomiting and adequate PO intake Anesthetic complications: no   No notable events documented.  Last Vitals:  Vitals:   11/07/20 1655 11/07/20 1710  BP: 135/67 127/62  Pulse: 99 (!) 102  Resp: 14 15  Temp:    SpO2: 96% 97%    Last Pain:  Vitals:   11/07/20 1710  TempSrc:   PainSc: 0-No pain    LLE Motor Response: Purposeful movement;Responds to commands (11/07/20 1710) LLE Sensation: Full sensation (11/07/20 1710) RLE Motor Response: Purposeful movement;Responds to commands (11/07/20 1710) RLE Sensation: Full sensation (11/07/20 1710)      Kileen Lange,E. Melika Reder

## 2020-11-07 NOTE — H&P (Signed)
Katelyn Kennedy is an 75 y.o. female.   Chief Complaint: Brain tumor HPI: 75 year old female with left upper and lower extremity weakness which is progressively worsening over the past month.  Patient with a remote history of lung cancer status post radiation and chemotherapy with no known active disease.  Work-up is demonstrated evidence of a right parietal mass with associated edema worrisome for metastatic disease versus meningioma.  Patient presents now for surgical resection.  Currently she has some residual left upper and lower extremity weakness.  She is on steroid medication and antiepileptic medication.  She has no changes in appetite.  She has no fevers chills or weight loss.  She has no other signs of malignancy.  Past Medical History:  Diagnosis Date   Allergic rhinitis    Arthritis    Diabetes mellitus without complication ([place])    diet control/no meds per pt   Dyspnea    Essential hypertension 04/11/2019   History of chicken pox    History of hiatal hernia    lung ca dx'd 04/2017   Peripheral vascular disease (HCC)    Pneumonia    PONV (postoperative nausea and vomiting)    Smoker     Past Surgical History:  Procedure Laterality Date   ANKLE FRACTURE SURGERY Left 10 years ago   "hard to wake up from anesthesia" per pt   [place] Right 1978   BENIGN CYST   BREAST EXCISIONAL BIOPSY Left 2011   Benign   BREAST EXCISIONAL BIOPSY Right 1985   Benign    CATARACT EXTRACTION     CATARACT EXTRACTION W/ INTRAOCULAR LENS IMPLANT Bilateral    COLONOSCOPY  2011   ELECTROMAGNETIC NAVIGATION BROCHOSCOPY N/A 06/08/2017   Procedure: ELECTROMAGNETIC NAVIGATION BRONCHOSCOPY;  Surgeon: Flora Lipps, MD;  Location: [facility] ORS;  Service: Cardiopulmonary;  Laterality: N/A;   FRACTURE SURGERY     POLYPECTOMY  2011   TRANSURETHRAL RESECTION OF BLADDER TUMOR WITH MITOMYCIN-C N/A 05/04/2017   Procedure: TRANSURETHRAL RESECTION OF BLADDER TUMOR WITH  gemcitabine;  Surgeon: Abbie Sons, MD;  Location: [facility] ORS;  Service: Urology;  Laterality: N/A;   TUBAL LIGATION      Family History  Problem Relation Age of Onset   Diabetes Mother    Hypothyroidism Mother    Cancer Sister 8       BREAST   Breast cancer Sister    Hypothyroidism Sister    Cancer Maternal Grandmother        COLON   Colon cancer Maternal Grandmother 48   Stomach cancer Neg Hx    Social History:  reports that she quit smoking about 3 years ago. Her smoking use included cigarettes. She has a 20.00 pack-year smoking history. She has never used smokeless tobacco. She reports previous alcohol use. She reports that she does not use drugs.  Allergies:  Allergies  Allergen Reactions   Codeine Nausea Only    Medications Prior to Admission  Medication Sig Dispense Refill   acetaminophen (TYLENOL) 500 MG tablet Take 500 mg by mouth daily as needed for moderate pain or headache.     amitriptyline (ELAVIL) 50 MG tablet Take 1 tablet (50 mg total) by mouth at bedtime. 90 tablet 3   atorvastatin (LIPITOR) 40 MG tablet 1 tablet  po daily (Patient taking differently: Take 40 mg by mouth daily.) 90 tablet 3   Calcium Citrate-Vitamin D (CITRACAL + D PO) Take 1 tablet by mouth daily.  cholecalciferol (VITAMIN D) 25 MCG (1000 UNIT) tablet Take 1,000 Units by mouth daily.     dexamethasone (DECADRON) 4 MG tablet Take 1 tablet (4 mg total) by mouth 3 (three) times daily. 90 tablet 0   lisinopril (ZESTRIL) 2.5 MG tablet TAKE 1 TABLET(2.5 MG) BY MOUTH DAILY (Patient taking differently: Take 2.5 mg by mouth daily.) 90 tablet 3   Multiple Vitamin (MULTIVITAMIN) capsule Take 1 capsule by mouth daily. Centrum Silver     vitamin B-12 (CYANOCOBALAMIN) 1000 MCG tablet Take 1,000 mcg by mouth daily.      Results for orders placed or performed during the hospital encounter of 11/07/20 (from the past 48 hour(s))  Glucose, capillary     Status: Abnormal   Collection Time: 11/07/20 11:17  AM  Result Value Ref Range   Glucose-Capillary 168 (H) 70 - 99 mg/dL    Comment: Glucose reference range applies only to samples taken after fasting for at least 8 hours.  Prepare RBC (crossmatch)     Status: None   Collection Time: 11/07/20 11:25 AM  Result Value Ref Range   Order Confirmation      ORDER PROCESSED BY BLOOD BANK Performed at [facility], [place] [address] [place], [place] [address]   ABO/Rh     Status: None   Collection Time: 11/07/20 11:35 AM  Result Value Ref Range   ABO/RH(D)      B POS Performed at [facility] [address], [place], [place] [address]    No results found.  Pertinent items noted in HPI and remainder of comprehensive ROS otherwise negative.  Blood pressure 125/69, pulse (!) 109, temperature 98.3 F (36.8 C), temperature source Oral, resp. rate 18, height 4\' 11"  (1.499 m), weight 60.9 kg, SpO2 97 %.  Patient is awake and alert.  She is oriented and appropriate.  Speech is fluent.  Judgment insight are intact.  Cranial nerve function normal aside from some mild facial weakness on the left side.  Motor examination extremities reveals 4/5 strength in her left upper and lower extremity with some increased tone.  Reflexes are hyperreflexic on the left normal on the right.  She has Hoffmann's responses and upgoing toes on the left side.  Gait is unsteady with a left lower extremity limp peer examination head ears eyes nose throat is unremarked.  Chest and abdomen are benign.  Extremities are free from injury or deformity. Assessment/Plan Right frontal parietal mass worrisome for meningioma versus metastatic disease.  Plan right frontal parietal craniotomy and resection of tumor using intraoperative stereotactic 6.  Risks and benefits been explained.  Patient wishes to proceed.  Mallie Mussel A [place] 11/07/2020, 1:20 PM

## 2020-11-07 NOTE — Brief Op Note (Signed)
11/07/2020  2:55 PM  PATIENT:  Katelyn Kennedy  75 y.o. female  PRE-OPERATIVE DIAGNOSIS:  Brain tumor  POST-OPERATIVE DIAGNOSIS:  * No post-op diagnosis entered *  PROCEDURE:  Procedure(s): Craniotomy - right - Parietal - Tumor (Right) APPLICATION OF CRANIAL NAVIGATION (Right)  SURGEON:  Surgeon(s) and Role:    Earnie Larsson, MD - Primary  PHYSICIAN ASSISTANT:   ASSISTANTS:    ANESTHESIA:   general  EBL:  25 cc   BLOOD ADMINISTERED:none  DRAINS: none   LOCAL MEDICATIONS USED:  LIDOCAINE   SPECIMEN:  Source of Specimen:  Posterior frontal lobe  DISPOSITION OF SPECIMEN:  PATHOLOGY  COUNTS:  YES  TOURNIQUET:  * No tourniquets in log *  DICTATION: .Dragon Dictation  PLAN OF CARE: Admit to inpatient   PATIENT DISPOSITION:  PACU - hemodynamically stable.   Delay start of Pharmacological VTE agent (>24hrs) due to surgical blood loss or risk of bleeding: yes

## 2020-11-07 NOTE — Op Note (Signed)
Date of procedure: 11/07/2020  Date of dictation: Same  Service: Neurosurgery  Preoperative diagnosis: Right posterior frontal dural based tumor  Postoperative diagnosis: Same  Procedure Name: Right stereotactically guided craniotomy and resection of tumor, microdissection  Surgeon:Lakysha Kossman A.Marla Pouliot, M.D.  Asst. Surgeon:   Anesthesia: General  Indication: 75 year old female with a remote history of lung cancer status post radiation treatment and chemotherapy presents with left-sided weakness and an enhancing dural based lesion in her right posterior frontal region consistent with either a meningioma or metastatic disease.  Patient presents now for surgical resection.  Operative note: After induction of anesthesia, patient positioned supine with her head fixed in [name] pin headrest.  Patient's scalp was prepped and draped sterilely after registering with the stereotactic computer.  Incision made in the right frontal parietal region.  This carried out sharply the cranium.  Self-retaining retractor placed.  Intraoperative stereotactic guidance confirmed approach to the tumor.  Craniotomy was then performed using high-speed drill.  Bone flap was elevated.  Dura was opened in a curvilinear fashion hinged along the medial aspect of the craniotomy.  Upon elevating the dura dural based lesion was encountered.  This was dissected free from the overlying dura.  The tumor had a mixed appearance with a firm fibrous appearance attached to the dura and a fleshy mucinous appearance within the brain itself.  A circumferential dissection was performed along the lesion that invaded the brain.  The seem more consistent with metastatic disease.  Specimen was sent to pathology for permanent evaluation.  A gross total resection was achieved.  There was no evidence of any complicating features.  Hemostasis was excellent.  The resection cavity was lined with Surgicel.  The dura was loosely reapproximated.  Gelfoam was  placed over the dural repair.  Cranial flap was then reattached using plates.  Scalp was closed using 2-0 Vicryl sutures in the galea and staples in the surface.  Sterile dressing was applied.  No apparent complications.  Patient tolerated the procedure well and she returns to recovery room postop. 11/07/2020

## 2020-11-07 NOTE — Anesthesia Procedure Notes (Signed)
Procedure Name: Intubation Date/Time: 11/07/2020 1:39 PM Performed by: Donnelly Angelica, RN Pre-anesthesia Checklist: Patient identified, Emergency Drugs available, Suction available and Patient being monitored Patient Re-evaluated:Patient Re-evaluated prior to induction Oxygen Delivery Method: Circle System Utilized Preoxygenation: Pre-oxygenation with 100% oxygen Induction Type: IV induction Ventilation: Mask ventilation without difficulty and Oral airway inserted - appropriate to patient size Laryngoscope Size: Mac and 3 Grade View: Grade I Tube type: Oral Tube size: 7.5 mm Number of attempts: 1 Airway Equipment and Method: Stylet and Oral airway Placement Confirmation: ETT inserted through vocal cords under direct vision, positive ETCO2 and breath sounds checked- equal and bilateral Secured at: 21 cm Tube secured with: Tape Dental Injury: Teeth and Oropharynx as per pre-operative assessment

## 2020-11-07 NOTE — Transfer of Care (Signed)
Immediate Anesthesia Transfer of Care Note  Patient: TRENESE HAFT  Procedure(s) Performed: Right Sided Craniotomy for Tumor (Right: Head) APPLICATION OF CRANIAL NAVIGATION (Right)  Patient Location: PACU  Anesthesia Type:General  Level of Consciousness: awake, alert  and patient cooperative  Airway & Oxygen Therapy: Patient Spontanous Breathing and Patient connected to face mask oxygen  Post-op Assessment: Report given to RN and Post -op Vital signs reviewed and stable  Post vital signs: Reviewed and stable  Last Vitals:  Vitals Value Taken Time  BP 121/61 11/07/20 1509  Temp    Pulse 94   Resp 15 11/07/20 1511  SpO2 97   Vitals shown include unvalidated device data.  Last Pain:  Vitals:   11/07/20 1123  TempSrc:   PainSc: 0-No pain      Patients Stated Pain Goal: 0 (15/37/94 3276)  Complications: No notable events documented.

## 2020-11-08 ENCOUNTER — Encounter (HOSPITAL_COMMUNITY): Payer: Self-pay | Admitting: Neurosurgery

## 2020-11-08 ENCOUNTER — Inpatient Hospital Stay (HOSPITAL_COMMUNITY): Payer: PPO

## 2020-11-08 LAB — GLUCOSE, CAPILLARY
Glucose-Capillary: 160 mg/dL — ABNORMAL HIGH (ref 70–99)
Glucose-Capillary: 151 mg/dL — ABNORMAL HIGH (ref 70–99)
Glucose-Capillary: 222 mg/dL — ABNORMAL HIGH (ref 70–99)

## 2020-11-08 LAB — CBC
HCT: 38.7 % (ref 36.0–46.0)
Hemoglobin: 13.3 g/dL (ref 12.0–15.0)
MCH: 31.7 pg (ref 26.0–34.0)
MCHC: 34.4 g/dL (ref 30.0–36.0)
MCV: 92.4 fL (ref 80.0–100.0)
Platelets: 215 10*3/uL (ref 150–400)
RBC: 4.19 MIL/uL (ref 3.87–5.11)
RDW: 13 % (ref 11.5–15.5)
WBC: 18.6 10*3/uL — ABNORMAL HIGH (ref 4.0–10.5)
nRBC: 0 % (ref 0.0–0.2)

## 2020-11-08 LAB — BASIC METABOLIC PANEL
Anion gap: 10 (ref 5–15)
BUN: 20 mg/dL (ref 8–23)
CO2: 21 mmol/L — ABNORMAL LOW (ref 22–32)
Calcium: 8.7 mg/dL — ABNORMAL LOW (ref 8.9–10.3)
Chloride: 104 mmol/L (ref 98–111)
Creatinine, Ser: 0.87 mg/dL (ref 0.44–1.00)
GFR, Estimated: >60 mL/min (ref >60)
Glucose, Bld: 219 mg/dL — ABNORMAL HIGH (ref 70–99)
Potassium: 4.3 mmol/L (ref 3.5–5.1)
Sodium: 135 mmol/L (ref 135–145)

## 2020-11-08 MED ORDER — LEVETIRACETAM 500 MG PO TABS: 500.0000 mg | ORAL_TABLET | Freq: Two times a day (BID) | ORAL | 0 refills | Status: DC

## 2020-11-08 MED ORDER — HYDROCODONE-ACETAMINOPHEN 5-325 MG PO TABS: 1.0000 | ORAL_TABLET | ORAL | 0 refills | Status: DC | PRN

## 2020-11-08 MED ORDER — DEXAMETHASONE 2 MG PO TABS: 2.0000 mg | ORAL_TABLET | Freq: Two times a day (BID) | ORAL | 0 refills | Status: DC

## 2020-11-08 MED ORDER — LEVETIRACETAM 500 MG PO TABS: 500.0000 mg | ORAL_TABLET | Freq: Two times a day (BID) | ORAL | Status: DC

## 2020-11-08 MED ORDER — GADOBUTROL 1 MMOL/ML IV SOLN
6.0000 mL | Freq: Once | INTRAVENOUS | Status: AC | PRN
  Administered 2020-11-08: 6 mL via INTRAVENOUS

## 2020-11-08 MED ORDER — CHLORHEXIDINE GLUCONATE CLOTH 2 % EX PADS: 6.0000 | MEDICATED_PAD | Freq: Every day | CUTANEOUS | Status: DC

## 2020-11-08 MED ORDER — PANTOPRAZOLE SODIUM 40 MG PO TBEC: 40.0000 mg | DELAYED_RELEASE_TABLET | Freq: Every day | ORAL | Status: DC

## 2020-11-08 NOTE — Discharge Summary (Addendum)
Physician Discharge Summary  Patient ID: Katelyn Kennedy MRN: [id number] DOB/AGE: 1946-01-08 75 y.o.  Admit date: 11/07/2020 Discharge date: 11/08/2020  Admission Diagnoses:  Discharge Diagnoses:  Active Problems:   Brain tumor [facility])   Discharged Condition: good  Hospital Course: Patient admitted to the hospital where she underwent uncomplicated craniotomy and resection of right frontal parietal tumor.  Postoperatively doing well.  No headache.  Strength and movement much improved.  Follow-up MRI scan demonstrates gross total resection without complication.  Pathology consistent with metastatic carcinoma.  Full reading to follow.  Plan for discharge home.  [name].  Patient will follow-up with her oncologist next week and will be planned for stereotactic radiosurgery to her tumor bed.  Consults:   Significant Diagnostic Studies:   Treatments:   Discharge Exam: Blood pressure (!) 98/58, pulse 93, temperature 99.1 F (37.3 C), temperature source Oral, resp. rate 16, height 4\' 11"  (1.499 m), weight 60.9 kg, SpO2 94 %. Awake and alert.  Oriented and appropriate.  Judgment insight intact.  Speech fluent.  Cranial nerve function normal active.  Motor examination 5/5 right upper and lower extremity.  4+/5 left upper and left lower extremity with minimal pronator drift.  Patient able to stand and ambulate.  Chest and abdomen benign.  Extremities free from injury deformity.  Wound clean and dry.  Disposition: Discharge disposition: 01-Home or Self Care       Allergies as of 11/08/2020       Reactions   Codeine Nausea Only        Medication List     TAKE these medications    acetaminophen 500 MG tablet Commonly known as: TYLENOL Take 500 mg by mouth daily as needed for moderate pain or headache.   amitriptyline 50 MG tablet Commonly known as: ELAVIL Take 1 tablet (50 mg total) by mouth at bedtime.   atorvastatin 40 MG tablet Commonly known as:  LIPITOR 1 tablet  po daily What changed:  how much to take how to take this when to take this additional instructions   cholecalciferol 25 MCG (1000 UNIT) tablet Commonly known as: VITAMIN D Take 1,000 Units by mouth daily.   CITRACAL + D PO Take 1 tablet by mouth daily.   dexamethasone 2 MG tablet Commonly known as: DECADRON Take 1 tablet (2 mg total) by mouth 2 (two) times daily with a meal. What changed:  medication strength how much to take when to take this   HYDROcodone-acetaminophen 5-325 MG tablet Commonly known as: NORCO/VICODIN Take 1 tablet by mouth every 4 (four) hours as needed for moderate pain.   levETIRAcetam 500 MG tablet Commonly known as: Keppra Take 1 tablet (500 mg total) by mouth 2 (two) times daily.   lisinopril 2.5 MG tablet Commonly known as: ZESTRIL TAKE 1 TABLET(2.5 MG) BY MOUTH DAILY What changed: See the new instructions.   multivitamin capsule Take 1 capsule by mouth daily. Centrum Silver   vitamin B-12 1000 MCG tablet Commonly known as: CYANOCOBALAMIN Take 1,000 mcg by mouth daily.               Durable Medical Equipment  (From admission, onward)           Start     Ordered   11/08/20 1358  For home use only DME Walker rolling  Once       Question Answer Comment  Walker: With 5 Inch Wheels   Patient needs a walker to treat with the following  condition S/P craniotomy      11/08/20 1358   11/08/20 1358  For home use only DME Tub bench  Once        11/08/20 1358            [place] Follow up.   Why: Home physical and occupational therapy; agency will call you to arrange appointments. Contact information: [place], [place]  Phone: [phone]        [name], MD. Go on 11/22/2020.   Specialty: Family Medicine Why: Appointment at 9:00am with Dr. Nena Alexander information: [place] [address] [phone]                  Signed: Charlie Pitter 11/08/2020, 4:20 PM

## 2020-11-08 NOTE — Progress Notes (Signed)
   Providing Compassionate, Quality Care - Together   Subjective: Patient reports no issues overnight.  Objective: Vital signs in last 24 hours: Temp:  [97.3 F (36.3 C)-99.7 F (37.6 C)] 99.7 F (37.6 C) (07/29 0800) Pulse Rate:  [81-109] 84 (07/29 0800) Resp:  [10-22] 10 (07/29 0800) BP: (94-135)/(51-75) 99/53 (07/29 0800) SpO2:  [93 %-100 %] 93 % (07/29 0800) Arterial Line BP: (101-162)/(43-63) 109/46 (07/29 0800) Weight:  [60.9 kg] 60.9 kg (07/28 1117)  Intake/Output from previous day: 07/28 0701 - 07/29 0700 In: 2273.5 [P.O.:360; I.V.:1514.4; IV Piggyback:399.1] Out: 2415 [Urine:2365; Blood:50] Intake/Output this shift: Total I/O In: 75.5 [I.V.:74.6; IV Piggyback:0.9] Out: -   Alert and oriented x 4 PERRLA CN II-XII grossly intact MAE, Sensation intact, mild weakness in LUE and LLE Incision is covered with stocking cap, no drainage noted on dressing   Lab Results: Recent Labs    11/05/20 1500 11/08/20 0229  WBC 12.5* 18.6*  HGB 15.2* 13.3  HCT 44.9 38.7  PLT 318 215   BMET Recent Labs    11/05/20 1500 11/08/20 0229  NA 135 135  K 4.0 4.3  CL 101 104  CO2 21* 21*  GLUCOSE 169* 219*  BUN 29* 20  CREATININE 0.90 0.87  CALCIUM 9.8 8.7*    Studies/Results: No results found.  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 Annette Stable. She is doing well. Follow up MRI scheduled for this morning.   LOS: 1 day   -Pt to possibly discharge home later today pending MRI results.   Viona Gilmore, DNP, AGNP-C Nurse Practitioner  [facility] [address], [place], [place] [address] P: [phone]    F: [phone]  11/08/2020, 9:03 AM

## 2020-11-08 NOTE — Evaluation (Signed)
Occupational Therapy Evaluation Patient Details Name: Katelyn Kennedy MRN: [id number] DOB: 1945/05/11 Today's Date: 11/08/2020    History of Present Illness This 75 y.o. female admitted with progressive Lt sided weakness.  MRI showed Rt frontal parietal mass worrisome for meningioma vs metastatic disease.  She underwent Rt frontal/parietal craniotomy with resection.  PMH includes: h/o Lung  CA, DM, arthritis, HTN, PVD, PNA, s/p ankle fracture with repair   Clinical Impression   Pt admitted with the above diagnosis and demonstrates the below deficits.  She currently requires min guard assist to min A for ADLs due to Lt sided weakness and Lt inattention/neglect.   She reports she lives alone, but family and caregiver have been assisting since onset of symptoms.  She reports she has not driven since onset of symptoms.  Recommend HHOT, 24 hour supervision, at least initially, then can taper down if she is functioning safely in her home environment.  Although this may need to be fairly fluid as she goes through treatment as her functional status will likely fluctuate.  Recommend she not drive, and that she have direct assist/supervision with finances, medication management, meal prep, and community activities.  All further OT needs can be addressed by [facility], therefore acute OT will sign off at this time due to potential discharge later today.     Follow Up Recommendations  Home health OT;Supervision/Assistance - 24 hour    Equipment Recommendations  Tub/shower seat    Recommendations for Other Services       Precautions / Restrictions Precautions Precautions: Fall      Mobility Bed Mobility Overal bed mobility: Needs Assistance Bed Mobility: Sit to Supine;Supine to Sit     Supine to sit: Supervision Sit to supine: Min guard   General bed mobility comments: min guard for return to supine for safety as she stays close to the Lt edge of the bed and required increased attempts to get fully  in bed.  While getting into the bed she failed to remove her Lt hand from her walker until fully supine, and cued to remove it.    Transfers Overall transfer level: Needs assistance Equipment used: Rolling walker (2 wheeled) Transfers: Sit to/from [facility] Sit to Stand: Min guard Stand pivot transfers: Min guard       General transfer comment: verbal cues for Lt hand placement and min guard for safety    Balance Overall balance assessment: Needs assistance Sitting-balance support: Feet supported Sitting balance-Leahy Scale: Good     Standing balance support: During functional activity Standing balance-Leahy Scale: Fair                             ADL either performed or assessed with clinical judgement   ADL Overall ADL's : Needs assistance/impaired Eating/Feeding: Modified independent;Bed level   Grooming: Wash/dry hands;Wash/dry face;Oral care;Min guard;Standing   Upper Body Bathing: Supervision/ safety;Sitting   Lower Body Bathing: Minimal assistance;Sit to/from stand   Upper Body Dressing : Minimal assistance;Sitting   Lower Body Dressing: Minimal assistance;Sit to/from stand Lower Body Dressing Details (indicate cue type and reason): difficulty donning Rt sock Toilet Transfer: Min guard;Ambulation;Comfort height toilet;Grab bars;RW   Toileting- Clothing Manipulation and Hygiene: Minimal assistance;Sit to/from stand       Functional mobility during ADLs: Min guard;Rolling walker General ADL Comments: verbal cues to fully attend to her Lt side     Vision Baseline Vision/History: Wears glasses Wears Glasses: At all times Patient  Visual Report: No change from baseline Vision Assessment?: Yes Eye Alignment: Within Functional Limits Ocular Range of Motion: Within Functional Limits Alignment/Gaze Preference: Within Defined Limits Tracking/Visual Pursuits: Able to track stimulus in all quads without difficulty Visual Fields: No  apparent deficits Additional Comments: Pt demonstrates Rt gaze preference     Perception Perception Perception Tested?: Yes Perception Deficits: Inattention/neglect Inattention/Neglect: Does not attend to left visual field;Does not attend to left side of body Comments: min cues to attend conisistently to [place] tested?: Within functional limits    Pertinent Vitals/Pain Pain Assessment: No/denies pain     Hand Dominance Right   Extremity/Trunk Assessment Upper Extremity Assessment Upper Extremity Assessment: LUE deficits/detail LUE Deficits / Details: grossly 4-/5-4/5.  Lt inattention noted which impedes her coordination and functional use. Pt tends to forget where her arm is requiring min cues to locate it LUE Sensation: decreased proprioception LUE Coordination: decreased gross motor;decreased fine motor   Lower Extremity Assessment Lower Extremity Assessment: Defer to PT evaluation   Cervical / Trunk Assessment Cervical / Trunk Assessment: Normal   Communication Communication Communication: No difficulties   Cognition Arousal/Alertness: Awake/alert Behavior During Therapy: WFL for tasks assessed/performed Overall Cognitive Status: No family/caregiver present to determine baseline cognitive functioning Area of Impairment: Attention;Problem solving                   Current Attention Level: Selective;Alternating         Problem Solving: Slow processing General Comments: Pt requires min cues for processing at times.  She occasionally self distracts.   General Comments  VSS.  Discussed recommendation for 24 hour assist with pt    Exercises     Shoulder Instructions      Home Living Family/patient expects to be discharged to:: Private residence Living Arrangements: Alone Available Help at Discharge: Family;Available 24 hours/day Type of Home: Apartment Home Access: Stairs to enter Entrance Stairs-Number of Steps: 1 Entrance  Stairs-Rails: None Home Layout: One level     Bathroom Shower/Tub: Teacher, early years/pre: Standard     Home Equipment: Environmental consultant - 4 wheels;Grab bars - tub/shower   Additional Comments: Pt reports her [place] from [place] has been staying with her recently, and will stay at least until the first of the week, and that her family is working to coordinate care.  She reports she has a caregiver      Prior Functioning/Environment Level of Independence: Needs assistance  Gait / Transfers Assistance Needed: modified independent with rollator for gait ADL's / Homemaking Assistance Needed: Pt reports she showers in standing, but that her daughter supervises.  She reports she has not been driving since onset of symptoms.  She reports she uses pill boxes to manage medications            OT Problem List: Decreased strength;Decreased activity tolerance;Impaired balance (sitting and/or standing);Impaired vision/perception;Decreased coordination;Decreased cognition;Decreased safety awareness;Decreased knowledge of use of DME or AE;Impaired UE functional use      OT Treatment/Interventions:      OT Goals(Current goals can be found in the care plan section) Acute Rehab OT Goals Patient Stated Goal: to go home OT Goal Formulation: All assessment and education complete, DC therapy  OT Frequency:     Barriers to D/C:            Co-evaluation              AM-PAC OT "6 Clicks" Daily Activity     Outcome  Measure Help from another person eating meals?: None Help from another person taking care of personal grooming?: A Little Help from another person toileting, which includes using toliet, bedpan, or urinal?: A Little Help from another person bathing (including washing, rinsing, drying)?: A Little Help from another person to put on and taking off regular upper body clothing?: A Little Help from another person to put on and taking off regular lower body clothing?: A Little 6 Click  Score: 19   End of Session Equipment Utilized During Treatment: Rolling walker Nurse Communication: Mobility status  Activity Tolerance: Patient tolerated treatment well Patient left: in bed;with call bell/phone within reach;with bed alarm set  OT Visit Diagnosis: Unsteadiness on feet (R26.81);Hemiplegia and hemiparesis;Low vision, both eyes (H54.2) Hemiplegia - Right/Left: Left Hemiplegia - dominant/non-dominant: Non-Dominant Hemiplegia - caused by: Unspecified (brain tumor)                Time: 7412-8786 OT Time Calculation (min): 30 min Charges:  OT General Charges $OT Visit: 1 Visit OT Evaluation $OT Eval Moderate Complexity: 1 Mod OT Treatments $Self Care/Home Management : 8-22 mins  Nilsa Nutting., OTR/L Acute Rehabilitation Services Pager [phone] Office [phone]   Lucille Passy M 11/08/2020, 11:31 AM

## 2020-11-08 NOTE — Progress Notes (Signed)
Pt seen and evaluated. Noted L sided deficits with L sided inattention. Required min A for gait and tended to drift to the R. Feel pt will require 24/7 supervision at d/c. REcommending HHPT and RW for d/c. Formal note to follow.   Reuel Derby, PT, DPT  Acute Rehabilitation Services  Pager: [phone] Office: [phone]

## 2020-11-08 NOTE — Discharge Instructions (Signed)

## 2020-11-08 NOTE — Progress Notes (Signed)
11/08/20 1422  PT Visit Information  Last PT Received On 11/08/20  Assistance Needed +1  History of Present Illness This 75 y.o. female admitted 7/28 with progressive Lt sided weakness.  MRI showed Rt frontal parietal mass worrisome for meningioma vs metastatic disease.  She underwent Rt frontal/parietal craniotomy with resection on 7/28.  PMH includes: h/o Lung  CA, DM, arthritis, HTN, PVD, PNA, s/p ankle fracture with repair  Precautions  Precautions Fall  Restrictions  Weight Bearing Restrictions No  Home Living  Family/patient expects to be discharged to: Private residence  Living Arrangements Alone  Available Help at Discharge Family;Available 24 hours/day  Type of Home Apartment  Home Access Stairs to enter  Entrance Stairs-Number of Steps 1  Entrance Stairs-Rails None  Home Layout One level  Bathroom Shower/Tub Tub/shower unit  Tax adviser - 4 wheels;Grab bars - tub/shower  Additional Comments Pt reports her [place] from [place] has been staying with her recently, and will stay at least until the first of the week, and that her family is working to coordinate care.  She reports she has a caregiver  Prior Function  Level of Independence Needs assistance  Gait / Transfers Assistance Needed modified independent with rollator for gait  ADL's / Homemaking Assistance Needed Pt reports she showers in standing, but that her daughter supervises.  She reports she has not been driving since onset of symptoms.  She reports she uses pill boxes to manage medications  Communication  Communication No difficulties  Pain Assessment  Pain Assessment No/denies pain  Cognition  Arousal/Alertness Awake/alert  Behavior During Therapy WFL for tasks assessed/performed  Overall Cognitive Status No family/caregiver present to determine baseline cognitive functioning  Area of Impairment Attention;Problem solving  Current Attention Level Selective;Alternating  Problem  Solving Slow processing  General Comments Pt requires min cues for processing at times.  She occasionally self distracts. Difficulty with dual tasking  Upper Extremity Assessment  Upper Extremity Assessment LUE deficits/detail  Lower Extremity Assessment  Lower Extremity Assessment LLE deficits/detail  LLE Deficits / Details Groslly 3/5 at hip and 4/5 throughout rest of LLE. Decreased coordination noted with L inattention  Cervical / Trunk Assessment  Cervical / Trunk Assessment Normal  Bed Mobility  Overal bed mobility Needs Assistance  Bed Mobility Sit to Supine;Supine to Sit  Supine to sit Min assist  Sit to supine Min guard  General bed mobility comments Min A for assisting with scooting hips to EOB. Min guard for safety to return to supine.  Transfers  Overall transfer level Needs assistance  Equipment used Rolling walker (2 wheeled)  Transfers Sit to/from Stand  Sit to Stand Min guard  General transfer comment verbal cues for Lt hand placement and min guard for safety.  Ambulation/Gait  Ambulation/Gait assistance Min guard;Min assist  Gait Distance (Feet) 120 Feet  Assistive device Rolling walker (2 wheeled)  Gait Pattern/deviations Step-through pattern;Decreased stride length;Decreased dorsiflexion - left;Drifts right/left  General Gait Details Pt tended to drift R. Very slow, guarded gait. Would stop when answering questions and had difficulty dual tasking. Cues for safety when using RW.  Gait velocity Decreased  Balance  Overall balance assessment Needs assistance  Sitting-balance support Feet supported  Sitting balance-Leahy Scale Good  Standing balance support During functional activity  Standing balance-Leahy Scale Poor  Standing balance comment Reliant on UE support  General Comments  General comments (skin integrity, edema, etc.) VSS  PT - End of Session  Equipment Utilized During  Treatment Gait belt  Activity Tolerance Patient tolerated treatment well  Patient  left in bed;with call bell/phone within reach  Nurse Communication Mobility status  PT Assessment  PT Recommendation/Assessment Patient needs continued PT services  PT Visit Diagnosis Unsteadiness on feet (R26.81);Muscle weakness (generalized) (M62.81);Other symptoms and signs involving the nervous system (R29.898)  PT Problem List Decreased strength;Decreased activity tolerance;Decreased balance;Decreased mobility;Decreased knowledge of use of DME;Decreased knowledge of precautions;Decreased safety awareness;Decreased cognition  PT Plan  PT Frequency (ACUTE ONLY) Min 3X/week  PT Treatment/Interventions (ACUTE ONLY) DME instruction;Gait training;Stair training;Functional mobility training;Therapeutic activities;Therapeutic exercise;Balance training;Patient/family education;Neuromuscular re-education;Cognitive remediation  AM-PAC PT "6 Clicks" Mobility Outcome Measure (Version 2)  Help needed turning from your back to your side while in a flat bed without using bedrails? 4  Help needed moving from lying on your back to sitting on the side of a flat bed without using bedrails? 3  Help needed moving to and from a bed to a chair (including a wheelchair)? 3  Help needed standing up from a chair using your arms (e.g., wheelchair or bedside chair)? 3  Help needed to walk in hospital room? 3  Help needed climbing 3-5 steps with a railing?  2  6 Click Score 18  Consider Recommendation of Discharge To: Home with [facility]  Progressive Mobility  What is the highest level of mobility based on the progressive mobility assessment? Level 5 (Walks with assist in room/hall) - Balance while stepping forward/back and can walk in room with assist - Complete  Mobility Ambulated with assistance in hallway  PT Recommendation  Follow Up Recommendations Home health PT;Supervision/Assistance - 24 hour  PT equipment Rolling walker with 5" wheels  Individuals Consulted  Consulted and Agree with Results and Recommendations  Patient  Acute Rehab PT Goals  Patient Stated Goal to go home  PT Goal Formulation With patient  Time For Goal Achievement 11/22/20  Potential to Achieve Goals Good  PT Time Calculation  PT Start Time (ACUTE ONLY) 0958  PT Stop Time (ACUTE ONLY) 1024  PT Time Calculation (min) (ACUTE ONLY) 26 min  PT General Charges  $$ ACUTE PT VISIT 1 Visit  PT Evaluation  $PT Eval Moderate Complexity 1 Mod  PT Treatments  $Gait Training 8-22 mins  Written Expression  Dominant Hand Right   Pt admitted secondary to problem above with deficits above. Pt requiring min guard to min A For mobility tasks using RW. Noted L sided weakness and inattention throughout and required safety cues for safety during mobility. Pt will require 24/7 assist at d/c. REcommending HHPT and use of RW for increased safety at d/c. Will continue to follow acutely.   Reuel Derby, PT, DPT  Acute Rehabilitation Services  Pager: [phone] Office: [phone]

## 2020-11-08 NOTE — TOC Transition Note (Signed)
Transition of Care [facility]) - CM/SW Discharge Note   Patient Details  Name: Katelyn Kennedy MRN: [id number] Date of Birth: 09-12-1945  [facility] CM/SW Contact:  Ella Bodo, RN Phone Number: 11/08/2020, 3:28 PM   Clinical Narrative:   This 75 y.o. female admitted with progressive Lt sided weakness.  MRI showed Rt frontal parietal mass worrisome for meningioma vs metastatic disease.  She underwent Rt frontal/parietal craniotomy with resection. PTA, pt independent and living at home alone.  Patient and MRI currently; spoke with patient's daughter, Katelyn Kennedy, who states that she will be with her mother for the first week, and other family members plan to provide 24-hour supervision after she leaves.  We discussed PT/OT's recommendations for home health at discharge; daughter agreeable to arrangement of services with contracted provider.  Referral to [facility] for continued PT/OT follow-up at home.  Referral to [facility] for recommended DME; rolling walker and tub bench to be delivered to patient's room prior to discharge.    Final next level of care: Home w Home Health Services Barriers to Discharge: Barriers Resolved   Patient Goals and CMS Choice   CMS Medicare.gov Compare Post Acute Care list provided to:: Patient Represenative (must comment) (daughter Katelyn Kennedy) Choice offered to / list presented to : Adult Children                        Discharge Plan and Services   Discharge Planning Services: CM Consult Post Acute Care Choice: Home Health          DME Arranged: Tub bench, Walker rolling DME Agency: [employer] Date DME Agency Contacted: 11/08/20 Time DME Agency Contacted: [id number] Representative spoke with at DME Agency: Freda Munro HH Arranged: PT, OT [place] Agency: [facility] Date [place]: 11/08/20 Time [place]: 1414 Representative spoke with at [place]: Chrissie Noa  Social Determinants of Health (SDOH) Interventions      Readmission Risk Interventions Readmission Risk Prevention Plan 11/08/2020  Post Dischage Appt Complete  Medication Screening Complete  Transportation Screening Complete  Some recent data might be hidden   Reinaldo Raddle, RN, BSN  Trauma/Neuro ICU Case Manager [phone]

## 2020-11-10 DIAGNOSIS — Z85118 Personal history of other malignant neoplasm of bronchus and lung: Secondary | ICD-10-CM | POA: Diagnosis not present

## 2020-11-10 DIAGNOSIS — Z9841 Cataract extraction status, right eye: Secondary | ICD-10-CM | POA: Diagnosis not present

## 2020-11-10 DIAGNOSIS — Z8701 Personal history of pneumonia (recurrent): Secondary | ICD-10-CM | POA: Diagnosis not present

## 2020-11-10 DIAGNOSIS — Z7951 Long term (current) use of inhaled steroids: Secondary | ICD-10-CM | POA: Diagnosis not present

## 2020-11-10 DIAGNOSIS — E1122 Type 2 diabetes mellitus with diabetic chronic kidney disease: Secondary | ICD-10-CM | POA: Diagnosis not present

## 2020-11-10 DIAGNOSIS — E1151 Type 2 diabetes mellitus with diabetic peripheral angiopathy without gangrene: Secondary | ICD-10-CM | POA: Diagnosis not present

## 2020-11-10 DIAGNOSIS — Z87891 Personal history of nicotine dependence: Secondary | ICD-10-CM | POA: Diagnosis not present

## 2020-11-10 DIAGNOSIS — M1990 Unspecified osteoarthritis, unspecified site: Secondary | ICD-10-CM | POA: Diagnosis not present

## 2020-11-10 DIAGNOSIS — I129 Hypertensive chronic kidney disease with stage 1 through stage 4 chronic kidney disease, or unspecified chronic kidney disease: Secondary | ICD-10-CM | POA: Diagnosis not present

## 2020-11-10 DIAGNOSIS — R978 Other abnormal tumor markers: Secondary | ICD-10-CM | POA: Diagnosis not present

## 2020-11-10 DIAGNOSIS — Z9181 History of falling: Secondary | ICD-10-CM | POA: Diagnosis not present

## 2020-11-10 DIAGNOSIS — Z8781 Personal history of (healed) traumatic fracture: Secondary | ICD-10-CM | POA: Diagnosis not present

## 2020-11-10 DIAGNOSIS — Z483 Aftercare following surgery for neoplasm: Secondary | ICD-10-CM | POA: Diagnosis not present

## 2020-11-10 DIAGNOSIS — Z9842 Cataract extraction status, left eye: Secondary | ICD-10-CM | POA: Diagnosis not present

## 2020-11-10 DIAGNOSIS — E039 Hypothyroidism, unspecified: Secondary | ICD-10-CM | POA: Diagnosis not present

## 2020-11-10 DIAGNOSIS — Z85841 Personal history of malignant neoplasm of brain: Secondary | ICD-10-CM | POA: Diagnosis not present

## 2020-11-10 DIAGNOSIS — N189 Chronic kidney disease, unspecified: Secondary | ICD-10-CM | POA: Diagnosis not present

## 2020-11-11 ENCOUNTER — Ambulatory Visit (HOSPITAL_COMMUNITY)
Admission: RE | Admit: 2020-11-11 | Discharge: 2020-11-11 | Disposition: A | Discharge status: Home / Self Care | Payer: PPO | Source: Ambulatory Visit | Attending: Internal Medicine | Admitting: Internal Medicine

## 2020-11-11 ENCOUNTER — Other Ambulatory Visit: Payer: Self-pay

## 2020-11-11 ENCOUNTER — Encounter (HOSPITAL_COMMUNITY): Payer: Self-pay

## 2020-11-11 ENCOUNTER — Inpatient Hospital Stay: Payer: PPO | Attending: Internal Medicine

## 2020-11-11 ENCOUNTER — Telehealth: Payer: Self-pay

## 2020-11-11 DIAGNOSIS — I10 Essential (primary) hypertension: Secondary | ICD-10-CM | POA: Diagnosis not present

## 2020-11-11 DIAGNOSIS — C3432 Malignant neoplasm of lower lobe, left bronchus or lung: Secondary | ICD-10-CM | POA: Insufficient documentation

## 2020-11-11 DIAGNOSIS — I2510 Atherosclerotic heart disease of native coronary artery without angina pectoris: Secondary | ICD-10-CM | POA: Insufficient documentation

## 2020-11-11 DIAGNOSIS — C7931 Secondary malignant neoplasm of brain: Secondary | ICD-10-CM | POA: Diagnosis not present

## 2020-11-11 DIAGNOSIS — Z79899 Other long term (current) drug therapy: Secondary | ICD-10-CM | POA: Insufficient documentation

## 2020-11-11 DIAGNOSIS — F17210 Nicotine dependence, cigarettes, uncomplicated: Secondary | ICD-10-CM | POA: Diagnosis not present

## 2020-11-11 DIAGNOSIS — E119 Type 2 diabetes mellitus without complications: Secondary | ICD-10-CM | POA: Insufficient documentation

## 2020-11-11 DIAGNOSIS — J432 Centrilobular emphysema: Secondary | ICD-10-CM | POA: Insufficient documentation

## 2020-11-11 DIAGNOSIS — Z9221 Personal history of antineoplastic chemotherapy: Secondary | ICD-10-CM | POA: Insufficient documentation

## 2020-11-11 DIAGNOSIS — C3490 Malignant neoplasm of unspecified part of unspecified bronchus or lung: Secondary | ICD-10-CM

## 2020-11-11 DIAGNOSIS — Z923 Personal history of irradiation: Secondary | ICD-10-CM | POA: Insufficient documentation

## 2020-11-11 DIAGNOSIS — K449 Diaphragmatic hernia without obstruction or gangrene: Secondary | ICD-10-CM | POA: Insufficient documentation

## 2020-11-11 DIAGNOSIS — I739 Peripheral vascular disease, unspecified: Secondary | ICD-10-CM | POA: Insufficient documentation

## 2020-11-11 DIAGNOSIS — J439 Emphysema, unspecified: Secondary | ICD-10-CM | POA: Diagnosis not present

## 2020-11-11 DIAGNOSIS — I700 Atherosclerosis of aorta: Secondary | ICD-10-CM | POA: Diagnosis not present

## 2020-11-11 DIAGNOSIS — R911 Solitary pulmonary nodule: Secondary | ICD-10-CM | POA: Diagnosis not present

## 2020-11-11 DIAGNOSIS — R918 Other nonspecific abnormal finding of lung field: Secondary | ICD-10-CM | POA: Diagnosis not present

## 2020-11-11 LAB — CMP (CANCER CENTER ONLY)
ALT: 61 U/L — ABNORMAL HIGH (ref 0–44)
AST: 34 U/L (ref 15–41)
Albumin: 2.9 g/dL — ABNORMAL LOW (ref 3.5–5.0)
Alkaline Phosphatase: 63 U/L (ref 38–126)
Anion gap: 7 (ref 5–15)
BUN: 24 mg/dL — ABNORMAL HIGH (ref 8–23)
CO2: 26 mmol/L (ref 22–32)
Calcium: 9.9 mg/dL (ref 8.9–10.3)
Chloride: 104 mmol/L (ref 98–111)
Creatinine: 0.8 mg/dL (ref 0.44–1.00)
GFR, Estimated: >60 mL/min (ref >60)
Glucose, Bld: 206 mg/dL — ABNORMAL HIGH (ref 70–99)
Potassium: 4.4 mmol/L (ref 3.5–5.1)
Sodium: 137 mmol/L (ref 135–145)
Total Bilirubin: 0.5 mg/dL (ref 0.3–1.2)
Total Protein: 6 g/dL — ABNORMAL LOW (ref 6.5–8.1)

## 2020-11-11 LAB — CBC WITH DIFFERENTIAL (CANCER CENTER ONLY)
Abs Immature Granulocytes: 0.32 10*3/uL — ABNORMAL HIGH (ref 0.00–0.07)
Basophils Absolute: 0.1 10*3/uL (ref 0.0–0.1)
Basophils Relative: 1 %
Eosinophils Absolute: 0 10*3/uL (ref 0.0–0.5)
Eosinophils Relative: 0 %
HCT: 38.9 % (ref 36.0–46.0)
Hemoglobin: 13.8 g/dL (ref 12.0–15.0)
Immature Granulocytes: 2 %
Lymphocytes Relative: 9 %
Lymphs Abs: 1.4 10*3/uL (ref 0.7–4.0)
MCH: 32 pg (ref 26.0–34.0)
MCHC: 35.5 g/dL (ref 30.0–36.0)
MCV: 90.3 fL (ref 80.0–100.0)
Monocytes Absolute: 1.1 10*3/uL — ABNORMAL HIGH (ref 0.1–1.0)
Monocytes Relative: 7 %
Neutro Abs: 13.6 10*3/uL — ABNORMAL HIGH (ref 1.7–7.7)
Neutrophils Relative %: 81 %
Platelet Count: 210 10*3/uL (ref 150–400)
RBC: 4.31 MIL/uL (ref 3.87–5.11)
RDW: 12.8 % (ref 11.5–15.5)
WBC Count: 16.5 10*3/uL — ABNORMAL HIGH (ref 4.0–10.5)
nRBC: 0 % (ref 0.0–0.2)

## 2020-11-11 LAB — SURGICAL PATHOLOGY

## 2020-11-11 MED ORDER — IOHEXOL 350 MG/ML SOLN
65.0000 mL | Freq: Once | INTRAVENOUS | Status: AC | PRN
  Administered 2020-11-11: 65 mL via INTRAVENOUS

## 2020-11-11 NOTE — Telephone Encounter (Signed)
Transition Care Management Follow-up Telephone Call Date of discharge and from where: 11/08/2020, Katelyn Kennedy How have you been since you were released from the hospital? Patient is doing much better. Any questions or concerns? No  Items Reviewed: Did the pt receive and understand the discharge instructions provided? Yes  Medications obtained and verified? Yes  Other? No  Any new allergies since your discharge? No  Dietary orders reviewed? Yes Do you have support at home? Yes   Home Care and Equipment/Supplies: Were home health services ordered? yes If so, what is the name of the agency? [facility]  Has the agency set up a time to come to the patient's home? yes Were any new equipment or medical supplies ordered?  No What is the name of the medical supply agency? N/A Were you able to get the supplies/equipment? not applicable Do you have any questions related to the use of the equipment or supplies? No  Functional Questionnaire: (I = Independent and D = Dependent) ADLs: I  Bathing/Dressing- I  Meal Prep- I  Eating- I  Maintaining continence- I  Transferring/Ambulation- I  Managing Meds- I  Follow up appointments reviewed:  PCP Hospital f/u appt confirmed? Yes  Scheduled to see Dr. Zachery Dauer on 11/22/20 @ 9 am. [facility] f/u appt confirmed? Yes  Scheduled to see Dr. Julien Nordmann on 11/12/2020 @ 11 am. Are transportation arrangements needed? No  If their condition worsens, is the pt aware to call PCP or go to the Emergency Dept.? Yes Was the patient provided with contact information for the PCP's office or ED? Yes Was to pt encouraged to call back with questions or concerns? Yes

## 2020-11-12 ENCOUNTER — Telehealth: Payer: Self-pay | Admitting: Radiation Therapy

## 2020-11-12 ENCOUNTER — Inpatient Hospital Stay: Payer: PPO | Admitting: Internal Medicine

## 2020-11-12 VITALS — BP 117/68 | HR 107 | Temp 99.2°F | Resp 18 | Ht 59.0 in | Wt 134.4 lb

## 2020-11-12 DIAGNOSIS — C3432 Malignant neoplasm of lower lobe, left bronchus or lung: Secondary | ICD-10-CM | POA: Diagnosis not present

## 2020-11-12 DIAGNOSIS — D496 Neoplasm of unspecified behavior of brain: Secondary | ICD-10-CM

## 2020-11-12 DIAGNOSIS — C3431 Malignant neoplasm of lower lobe, right bronchus or lung: Secondary | ICD-10-CM | POA: Diagnosis not present

## 2020-11-12 DIAGNOSIS — C3490 Malignant neoplasm of unspecified part of unspecified bronchus or lung: Secondary | ICD-10-CM

## 2020-11-12 LAB — BPAM RBC
Blood Product Expiration Date: 202208192359
Blood Product Expiration Date: 202208222359
Unit Type and Rh: 7300
Unit Type and Rh: 7300

## 2020-11-12 LAB — TYPE AND SCREEN
ABO/RH(D): B POS
Antibody Screen: NEGATIVE
Unit division: 0
Unit division: 0

## 2020-11-12 NOTE — Progress Notes (Signed)
[place] [facility] [phone]   [phone]  OFFICE PROGRESS NOTE  [name], MD [place] [address]  DIAGNOSIS: Metastatic non-small cell lung cancer, adenocarcinoma initially diagnosed as stage IIIA (T2a, N2, M0) non-small cell lung cancer, adenocarcinoma presenting with right lower lobe lung mass in addition to subcarinal lymphadenopathy and suspicious metastatic pulmonary nodules diagnosed in February 2019.  She has evidence of metastatic disease with solitary brain metastasis in July 2022  PRIOR THERAPY: 1) Concurrent chemoradiation with carboplatin for an AUC of 2 with paclitaxel 45 mg/m weekly. First dose given on 06/28/2017.  Status post 7 cycles.  Last dose was giving August 09, 2017 with partial response. 2) Consolidation immunotherapy with Imfinzi (Durvalumab) 10 mg/KG every 2 weeks, first dose 09/23/2017.  Status post 26 cycles. Last dose was given on Sep 08, 2018. 3) status post craniotomy with resection of right posterior frontal dural based metastasis under the care of Dr. Trenton Gammon on November 07, 2020.  CURRENT THERAPY: Observation.  INTERVAL HISTORY: Katelyn Kennedy 75 y.o. female returns to the clinic today for follow-up visit accompanied by her daughter.  The patient is feeling fine today with no concerning complaints except for agitation from the steroid treatment and fatigue.  She was recently found to have solitary brain metastasis and the patient underwent craniotomy with resection of right posterior frontal dural based metastasis under the care of Dr. Eddie Dibbles on November 07, 2020 and the final pathology was consistent with metastatic adenocarcinoma of lung primary.  She is recovering well from her surgery.  She denied having any current chest pain, shortness of breath, cough or hemoptysis.  She denied having any fever or chills.  She has no nausea, vomiting, diarrhea or constipation.  She had repeat CT scan of the chest  performed recently and she is here for evaluation and discussion of her risk her results.  MEDICAL HISTORY: Past Medical History:  Diagnosis Date   Allergic rhinitis    Arthritis    Diabetes mellitus without complication ([place])    diet control/no meds per pt   Dyspnea    Essential hypertension 04/11/2019   History of chicken pox    History of hiatal hernia    lung ca dx'd 04/2017   Peripheral vascular disease (HCC)    Pneumonia    PONV (postoperative nausea and vomiting)    Smoker     ALLERGIES:  is allergic to codeine.  MEDICATIONS:  Current Outpatient Medications  Medication Sig Dispense Refill   acetaminophen (TYLENOL) 500 MG tablet Take 500 mg by mouth daily as needed for moderate pain or headache.     amitriptyline (ELAVIL) 50 MG tablet Take 1 tablet (50 mg total) by mouth at bedtime. 90 tablet 3   atorvastatin (LIPITOR) 40 MG tablet 1 tablet  po daily 90 tablet 3   Calcium Citrate-Vitamin D (CITRACAL + D PO) Take 1 tablet by mouth daily.     cholecalciferol (VITAMIN D) 25 MCG (1000 UNIT) tablet Take 1,000 Units by mouth daily.     dexamethasone (DECADRON) 2 MG tablet Take 1 tablet (2 mg total) by mouth 2 (two) times daily with a meal. 60 tablet 0   HYDROcodone-acetaminophen (NORCO/VICODIN) 5-325 MG tablet Take 1 tablet by mouth every 4 (four) hours as needed for moderate pain. 20 tablet 0   levETIRAcetam (KEPPRA) 500 MG tablet Take 1 tablet (500 mg total) by mouth 2 (two) times daily. 60 tablet 0  lisinopril (ZESTRIL) 2.5 MG tablet TAKE 1 TABLET(2.5 MG) BY MOUTH DAILY 90 tablet 3   Multiple Vitamin (MULTIVITAMIN) capsule Take 1 capsule by mouth daily. Centrum Silver     vitamin B-12 (CYANOCOBALAMIN) 1000 MCG tablet Take 1,000 mcg by mouth daily.     No current facility-administered medications for this visit.    SURGICAL HISTORY:  Past Surgical History:  Procedure Laterality Date   ANKLE FRACTURE SURGERY Left 10 years ago   "hard to wake up from anesthesia" per pt    APPLICATION OF CRANIAL NAVIGATION Right 11/07/2020   Procedure: APPLICATION OF CRANIAL NAVIGATION;  Surgeon: Earnie Larsson, MD;  Location: [facility];  Service: Neurosurgery;  Laterality: Right;   [place]   BREAST BIOPSY Right 1978   BENIGN CYST   BREAST EXCISIONAL BIOPSY Left 2011   Benign   BREAST EXCISIONAL BIOPSY Right 1985   Benign    CATARACT EXTRACTION     CATARACT EXTRACTION W/ INTRAOCULAR LENS IMPLANT Bilateral    COLONOSCOPY  2011   CRANIOTOMY Right 11/07/2020   Procedure: Right Sided Craniotomy for Tumor;  Surgeon: Earnie Larsson, MD;  Location: [facility];  Service: Neurosurgery;  Laterality: Right;   ELECTROMAGNETIC NAVIGATION BROCHOSCOPY N/A 06/08/2017   Procedure: ELECTROMAGNETIC NAVIGATION BRONCHOSCOPY;  Surgeon: Flora Lipps, MD;  Location: [facility];  Service: Cardiopulmonary;  Laterality: N/A;   FRACTURE SURGERY     POLYPECTOMY  2011   TRANSURETHRAL RESECTION OF BLADDER TUMOR WITH MITOMYCIN-C N/A 05/04/2017   Procedure: TRANSURETHRAL RESECTION OF BLADDER TUMOR WITH gemcitabine;  Surgeon: Abbie Sons, MD;  Location: [facility];  Service: Urology;  Laterality: N/A;   TUBAL LIGATION      REVIEW OF SYSTEMS:  Constitutional: positive for fatigue Eyes: negative Ears, nose, mouth, throat, and face: negative Respiratory: negative Cardiovascular: negative Gastrointestinal: negative Genitourinary:negative Integument/breast: negative Hematologic/lymphatic: negative Musculoskeletal:negative Neurological: negative Behavioral/Psych: negative Endocrine: negative Allergic/Immunologic: negative   PHYSICAL EXAMINATION: General appearance: alert, cooperative, fatigued, and no distress Head: Normocephalic, without obvious abnormality, atraumatic Neck: no adenopathy, no JVD, supple, symmetrical, trachea midline, and thyroid not enlarged, symmetric, no tenderness/mass/nodules Lymph nodes: Cervical, supraclavicular, and axillary nodes normal. Resp: clear to auscultation  bilaterally Back: symmetric, no curvature. ROM normal. No CVA tenderness. Cardio: regular rate and rhythm, S1, S2 normal, no murmur, click, rub or gallop GI: soft, non-tender; bowel sounds normal; no masses,  no organomegaly Extremities: extremities normal, atraumatic, no cyanosis or edema Neurologic: Alert and oriented X 3, normal strength and tone. Normal symmetric reflexes. Normal coordination and gait  ECOG PERFORMANCE STATUS: 1 - Symptomatic but completely ambulatory  Blood pressure 117/68, pulse (!) 107, temperature 99.2 F (37.3 C), temperature source Tympanic, resp. rate 18, height 4\' 11"  (1.499 m), weight 134 lb 6.4 oz (61 kg), SpO2 97 %.  LABORATORY DATA: Lab Results  Component Value Date   WBC 16.5 (H) 11/11/2020   HGB 13.8 11/11/2020   HCT 38.9 11/11/2020   MCV 90.3 11/11/2020   PLT 210 11/11/2020      Chemistry      Component Value Date/Time   NA 137 11/11/2020 1106   K 4.4 11/11/2020 1106   CL 104 11/11/2020 1106   CO2 26 11/11/2020 1106   BUN 24 (H) 11/11/2020 1106   CREATININE 0.80 11/11/2020 1106      Component Value Date/Time   CALCIUM 9.9 11/11/2020 1106   ALKPHOS 63 11/11/2020 1106   AST 34 11/11/2020 1106   ALT 61 (H) 11/11/2020 1106   BILITOT  0.5 11/11/2020 1106       RADIOGRAPHIC STUDIES: CT Chest W Contrast  Result Date: 11/12/2020 CLINICAL DATA:  Metastatic right lung cancer restaging, chemotherapy and immunotherapy, former smoker EXAM: CT CHEST WITH CONTRAST TECHNIQUE: Multidetector CT imaging of the chest was performed during intravenous contrast administration. CONTRAST:  53mL OMNIPAQUE IOHEXOL 350 MG/ML SOLN COMPARISON:  05/13/2020 FINDINGS: Cardiovascular: Aortic atherosclerosis. Normal heart size. Three-vessel coronary artery calcifications. No pericardial effusion. Mediastinum/Nodes: No enlarged mediastinal, hilar, or axillary lymph nodes. Small hiatal hernia. Thyroid gland, trachea, and esophagus demonstrate no significant findings.  Lungs/Pleura: Mild centrilobular emphysema. Unchanged, bandlike post treatment/radiation consolidation and fibrosis of the perihilar and paramedian right lung. Unchanged 0.7 cm pulmonary nodule of the dependent right lower lobe (series 5, image 92). Unchanged irregular subsolid nodule of the anterior right apex measuring 0.8 cm (series 5, image 24). Background of very fine centrilobular nodules, most concentrated in the lung apices. No pleural effusion or pneumothorax. Upper Abdomen: No acute abnormality. Unchanged hyperenhancing lesion of the anterior right lobe of the liver (series 2, image 115). A previously noted similar lesion of the anterior left lobe of the liver is not clearly seen on this examination. Musculoskeletal: No chest wall mass or suspicious bone lesions identified. IMPRESSION: 1. Unchanged, post treatment/radiation consolidation and fibrosis of the right lung. 2. Stable small solid and sub solid pulmonary nodules. Continued attention on follow-up. 3. Background of very fine centrilobular nodules, most concentrated in the lung apices, most commonly seen in smoking-related respiratory bronchiolitis. 4. Unchanged hyperenhancing lesion of the anterior right lobe of the liver, almost certainly a benign incidental flash filling hemangioma. A previously noted similar lesion of the anterior left lobe of the liver is not clearly seen on this examination, likely very sensitive to precise phase of contrast administration. 5. Emphysema. 6. Coronary artery disease. Aortic Atherosclerosis (ICD10-I70.0) and Emphysema (ICD10-J43.9). Electronically Signed   By: Eddie Candle M.D.   On: 11/12/2020 09:08   MR Brain Wo Contrast  Result Date: 10/30/2020 CLINICAL DATA:  Neuro deficit, acute, stroke suspected acute onset of left arm weakness and left leg weakness/numbness EXAM: MRI HEAD WITHOUT CONTRAST TECHNIQUE: Multiplanar, multiecho pulse sequences of the brain and surrounding structures were obtained without  intravenous contrast. COMPARISON:  06/09/2017 FINDINGS: Brain: There is a mass within the superior right frontal lobe with a large amount of surrounding vasogenic edema. Mass measures approximately 3.0 x 2.3 cm. There is some internal hemorrhage of uncertain age. No midline shift. Vascular: Major flow voids are preserved. Skull and upper cervical spine: Normal calvarium and skull base. Visualized upper cervical spine and soft tissues are normal. Sinuses/Orbits:No paranasal sinus fluid levels or advanced mucosal thickening. No mastoid or middle ear effusion. Normal orbits. IMPRESSION: Right frontal lobe mass, likely metastasis, with large amount of surrounding vasogenic edema and small amount of internal hemorrhage of uncertain age. CT would be helpful to better delineate hemorrhagic component. MRI with contrast would be better able to detect smaller metastases. Electronically Signed   By: Ulyses Jarred M.D.   On: 10/30/2020 22:08   MR BRAIN W WO CONTRAST  Result Date: 11/08/2020 CLINICAL DATA:  Brain/CNS neoplasm, assess treatment response. EXAM: MRI HEAD WITHOUT AND WITH CONTRAST TECHNIQUE: Multiplanar, multiecho pulse sequences of the brain and surrounding structures were obtained without and with intravenous contrast. CONTRAST:  73mL GADAVIST GADOBUTROL 1 MMOL/ML IV SOLN COMPARISON:  MRI of the brain November 01, 2020. FINDINGS: Brain: Interval right frontoparietal craniotomy for resection of right frontal 2 more with dural  and parenchymal component. Surgical cavity is seen containing blood products with thin peripheral restricted diffusion and contrast enhancement, likely related to expected postsurgical ischemia. No nodular focus of contrast enhancement seen to suggest residual tumor. Persistent vasogenic edema in the right frontoparietal region, surrounding the surgical cavity. Expected pneumocephalus. No hemorrhage, hydrocephalus, or extra-axial collection Vascular: Normal flow voids. Skull and upper cervical  spine: Postsurgical changes from right frontoparietal craniotomy. Marrow signal characteristics are otherwise maintained. Sinuses/Orbits: Bilateral lens surgery. Paranasal sinuses are clear. Other: None. IMPRESSION: Expected postsurgical changes from right frontoparietal craniotomy and resection of mass lesion with dural and intraparenchymal components. No focus of nodular contrast enhancement to suggest residual tumor. Continued follow-up suggested. Electronically Signed   By: Pedro Earls M.D.   On: 11/08/2020 17:19   MR Brain W Wo Contrast  Result Date: 11/02/2020 CLINICAL DATA:  Brain/CNS neoplasm. Lung cancer and bladder cancer. Left-sided weakness. EXAM: MRI HEAD WITHOUT AND WITH CONTRAST TECHNIQUE: Multiplanar, multiecho pulse sequences of the brain and surrounding structures were obtained without and with intravenous contrast. CONTRAST:  37mL MULTIHANCE GADOBENATE DIMEGLUMINE 529 MG/ML IV SOLN COMPARISON:  MR head without contrast 10/30/2020. MR head without and with contrast 06/09/2017 FINDINGS: Brain: Solitary enhancing lesion is again noted over the right frontal convexity. A dural component is present. Lesion measures 2.5 x 2.9 x 2.8 cm. Mildly restricted diffusion is present within the lesion. Peripheral susceptibility likely represents calcification. There is a focal area of T1 shortening near the superior aspect of the lesion measuring 8 mm which may represent hemorrhage. Marked surrounding vasogenic edema is present, spread throughout the right frontal lobe with some mass effect and sulcal effacement. No significant midline shift. CSF can be seen around the lesion on the coronal T2 weighted imaging consistent with an extra-axial lesion. Minimal periventricular white matter disease is otherwise within normal limits. The internal auditory canals are within normal limits. The brainstem and cerebellum are within normal limits. No other pathologic enhancement is present. Vascular:  Flow is present in the major intracranial arteries. Skull and upper cervical spine: The craniocervical junction is normal. Upper cervical spine is within normal limits. Marrow signal is unremarkable. Sinuses/Orbits: The paranasal sinuses and mastoid air cells are clear. Bilateral lens replacements are noted. Globes and orbits are otherwise unremarkable. IMPRESSION: 1. Solitary enhancing lesion over the right frontal convexity measures 2.5 x 2.9 x 2.8 cm with prominent surrounding vasogenic edema spread throughout the right frontal lobe. The lesion appears to be extra-axial with a clear dural component and mild restricted diffusion. 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 Dural metastasis is considered less likely. 2. Focal area of T1 shortening near the superior aspect of the lesion may represent hemorrhage. 3. No other evidence for metastatic disease to the brain. Electronically Signed   By: San Morelle M.D.   On: 11/02/2020 09:20     ASSESSMENT AND PLAN: This is a very pleasant 75 year old white female with now metastatic non-small cell lung cancer, adenocarcinoma initially diagnosed as stage IIIA non-small cell lung cancer, adenocarcinoma presented mainly with right lower lobe lung mass in addition to subcarinal lymphadenopathy but there was suspicious metastatic pulmonary nodules.  She has metastatic disease to the brain in July 2022. The patient completed a course of concurrent chemoradiation with weekly carboplatin and paclitaxel.  She tolerated her treatment well with no concerning complaints. The patient recently completed consolidation treatment with immunotherapy with Imfinzi (Durvalumab). She is status post 26 cycles. She tolerated it well without any adverse  effects.  The patient underwent craniotomy and surgical resection of the solitary brain metastasis under the care of Dr. Trenton Gammon and the final pathology was consistent with metastatic adenocarcinoma of the lung. The patient had repeat  CT scan of the chest performed recently.  I personally and independently reviewed the scan and discussed the results with the patient and her daughter. Her scan showed no concerning findings for disease progression in the chest. I recommended for her to continue on observation but I will repeat PET scan in 6 weeks to rule out any other metastatic disease especially with the recent brain metastasis. For the solitary brain metastasis s/p resection, the patient is expected to have SRS to this lesion soon. She was advised to call immediately if she has any other concerning symptoms in the interval. All questions were answered. The patient knows to call the clinic with any problems, questions or concerns. We can certainly see the patient much sooner if necessary.  Disclaimer: This note was dictated with voice recognition software. Similar sounding words can inadvertently be transcribed and may not be corrected upon review.

## 2020-11-12 NOTE — Telephone Encounter (Signed)
Called Katelyn Kennedy and shared the information about her upcoming Re-consult visit with Katelyn Kennedy on 8/11. Katelyn Kennedy is recovering well from her surgery, and is now at home without any deficits or problems. She plans to come in person to this visit and knows that I will be working on coordinating her post operative SRS treatment with doctors Moody and Pool. I will share those appointments with her when she is here on 8/11.   Katelyn Kennedy R.T.(R)(T) Radiation Special Procedures Navigator

## 2020-11-13 ENCOUNTER — Other Ambulatory Visit: Payer: Self-pay | Admitting: Radiation Therapy

## 2020-11-13 DIAGNOSIS — Z7951 Long term (current) use of inhaled steroids: Secondary | ICD-10-CM | POA: Diagnosis not present

## 2020-11-13 DIAGNOSIS — Z85841 Personal history of malignant neoplasm of brain: Secondary | ICD-10-CM | POA: Diagnosis not present

## 2020-11-13 DIAGNOSIS — Z9841 Cataract extraction status, right eye: Secondary | ICD-10-CM | POA: Diagnosis not present

## 2020-11-13 DIAGNOSIS — I129 Hypertensive chronic kidney disease with stage 1 through stage 4 chronic kidney disease, or unspecified chronic kidney disease: Secondary | ICD-10-CM | POA: Diagnosis not present

## 2020-11-13 DIAGNOSIS — Z85118 Personal history of other malignant neoplasm of bronchus and lung: Secondary | ICD-10-CM | POA: Diagnosis not present

## 2020-11-13 DIAGNOSIS — C7931 Secondary malignant neoplasm of brain: Secondary | ICD-10-CM

## 2020-11-13 DIAGNOSIS — E039 Hypothyroidism, unspecified: Secondary | ICD-10-CM | POA: Diagnosis not present

## 2020-11-13 DIAGNOSIS — E1122 Type 2 diabetes mellitus with diabetic chronic kidney disease: Secondary | ICD-10-CM | POA: Diagnosis not present

## 2020-11-13 DIAGNOSIS — M1990 Unspecified osteoarthritis, unspecified site: Secondary | ICD-10-CM | POA: Diagnosis not present

## 2020-11-13 DIAGNOSIS — Z87891 Personal history of nicotine dependence: Secondary | ICD-10-CM | POA: Diagnosis not present

## 2020-11-13 DIAGNOSIS — Z8781 Personal history of (healed) traumatic fracture: Secondary | ICD-10-CM | POA: Diagnosis not present

## 2020-11-13 DIAGNOSIS — Z9842 Cataract extraction status, left eye: Secondary | ICD-10-CM | POA: Diagnosis not present

## 2020-11-13 DIAGNOSIS — N189 Chronic kidney disease, unspecified: Secondary | ICD-10-CM | POA: Diagnosis not present

## 2020-11-13 DIAGNOSIS — E1151 Type 2 diabetes mellitus with diabetic peripheral angiopathy without gangrene: Secondary | ICD-10-CM | POA: Diagnosis not present

## 2020-11-13 DIAGNOSIS — R978 Other abnormal tumor markers: Secondary | ICD-10-CM | POA: Diagnosis not present

## 2020-11-13 DIAGNOSIS — Z8701 Personal history of pneumonia (recurrent): Secondary | ICD-10-CM | POA: Diagnosis not present

## 2020-11-13 DIAGNOSIS — Z9181 History of falling: Secondary | ICD-10-CM | POA: Diagnosis not present

## 2020-11-13 DIAGNOSIS — Z483 Aftercare following surgery for neoplasm: Secondary | ICD-10-CM | POA: Diagnosis not present

## 2020-11-21 ENCOUNTER — Ambulatory Visit
Admission: RE | Admit: 2020-11-21 | Discharge: 2020-11-21 | Disposition: A | Discharge status: Home / Self Care | Payer: PPO | Source: Ambulatory Visit | Attending: Radiation Oncology | Admitting: Radiation Oncology

## 2020-11-21 ENCOUNTER — Encounter: Payer: Self-pay | Admitting: Radiation Oncology

## 2020-11-21 ENCOUNTER — Other Ambulatory Visit: Payer: Self-pay

## 2020-11-21 ENCOUNTER — Ambulatory Visit
Admission: RE | Admit: 2020-11-21 | Discharge: 2020-11-21 | Disposition: A | Discharge status: Home / Self Care | Payer: PPO | Source: Ambulatory Visit

## 2020-11-21 VITALS — BP 117/67 | HR 105 | Temp 97.9°F | Resp 18 | Wt 134.4 lb

## 2020-11-21 DIAGNOSIS — C7931 Secondary malignant neoplasm of brain: Secondary | ICD-10-CM

## 2020-11-21 DIAGNOSIS — C3431 Malignant neoplasm of lower lobe, right bronchus or lung: Secondary | ICD-10-CM

## 2020-11-21 MED ORDER — LORAZEPAM 0.5 MG PO TABS: ORAL_TABLET | ORAL | 0 refills | Status: DC

## 2020-11-21 NOTE — Progress Notes (Signed)
Patient in for consult for metastatic lung to the brain she had her craniotomy on July 22nd she states her left sided weakness has improved. She has an area to left thigh that still has some stinging but everything else improved. No issues with GI/GU issues. Denies any pain at this time.

## 2020-11-21 NOTE — Progress Notes (Signed)
Radiation Oncology         [phone]) [phone] ________________________________  Name: Katelyn Kennedy        MRN: [id number]  Date of Service: 11/21/2020 DOB: January 14, 1946  VF:IEPPIRJ, Mervyn Gay, MD  Earnie Larsson, MD     REFERRING PHYSICIAN: Earnie Larsson, MD   DIAGNOSIS: The primary encounter diagnosis was 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 ([place]). A diagnosis of Brain metastasis ([place]) was also pertinent to this visit.   HISTORY OF PRESENT ILLNESS: Katelyn Kennedy is a 75 y.o. female with a remote history of urothelial carcinoma. She also has a history of adenocarcinoma of the right lung and was treated in 2019 with chemoradiation.  Recently, she presented with left arm and leg weakness to Katelyn Kennedy PCP and an MRI of the brain on 10/30/2020 identified a 3 x 2.3 cm mass in the right frontal lobe.  3T imaging measured this lesion at 2.5 x 2.9 x 2.8 cm with marked surrounding vasogenic edema.  She was started on steroids and Dr. Annette Stable was able to evaluate Katelyn Kennedy and recommended craniotomy upfront given the severity of Katelyn Kennedy symptoms.  She was taken to the operating room on 11/07/2020 for right-sided craniotomy for tumor debulking.  Postop imaging with MRI on 11/08/2020 showed expected postsurgical changes with resection of the mass lesion and no focus of nodular contrast enhancement to suggest residual was tumor was noted.  She did have repeat staging CT chest with contrast on 11/11/2020 which showed unchanged posttreatment changes in the right lung as well as stable nodules that had been followed as well as unchanged findings in the liver felt to be hemangiomatous emphysematous change and stigmata of atherosclerotic disease.  Dr. Julien Nordmann has ordered a PET scan.  She is scheduled to undergo an MRI of the brain with and without contrast for [facility] planning, and is seen today to discuss proceeding in this manner.    PREVIOUS RADIATION THERAPY:   06/28/2017 - 08/11/2017: The patient was treated to the disease  within the right lung initially to a dose of 60 Gy using a 4 field, 3-D conformal technique. The patient then received a cone down boost treatment for an additional 6 Gy. This yielded a final total dose of 66 Gy.     PAST MEDICAL HISTORY:  Past Medical History:  Diagnosis Date   Allergic rhinitis    Arthritis    Diabetes mellitus without complication (HCC)    diet control/no meds per pt   Dyspnea    Essential hypertension 04/11/2019   History of chicken pox    History of hiatal hernia    lung ca dx'd 04/2017   Peripheral vascular disease (HCC)    Pneumonia    PONV (postoperative nausea and vomiting)    Smoker        PAST SURGICAL HISTORY: Past Surgical History:  Procedure Laterality Date   ANKLE FRACTURE SURGERY Left 10 years ago   "hard to wake up from anesthesia" per pt   APPLICATION OF CRANIAL NAVIGATION Right 11/07/2020   Procedure: APPLICATION OF CRANIAL NAVIGATION;  Surgeon: Earnie Larsson, MD;  Location: [facility];  Service: Neurosurgery;  Laterality: Right;   BARTHOLYN GLAND REMOVAL  1981   BREAST BIOPSY Right 1978   BENIGN CYST   BREAST EXCISIONAL BIOPSY Left 2011   Benign   BREAST EXCISIONAL BIOPSY Right 1985   Benign    CATARACT EXTRACTION     CATARACT EXTRACTION W/ INTRAOCULAR LENS IMPLANT Bilateral    COLONOSCOPY  2011   CRANIOTOMY Right 11/07/2020   Procedure: Right Sided Craniotomy for Tumor;  Surgeon: Earnie Larsson, MD;  Location: [place];  Service: Neurosurgery;  Laterality: Right;   ELECTROMAGNETIC NAVIGATION BROCHOSCOPY N/A 06/08/2017   Procedure: ELECTROMAGNETIC NAVIGATION BRONCHOSCOPY;  Surgeon: Flora Lipps, MD;  Location: [facility];  Service: Cardiopulmonary;  Laterality: N/A;   FRACTURE SURGERY     POLYPECTOMY  2011   TRANSURETHRAL RESECTION OF BLADDER TUMOR WITH MITOMYCIN-C N/A 05/04/2017   Procedure: TRANSURETHRAL RESECTION OF BLADDER TUMOR WITH gemcitabine;  Surgeon: Abbie Sons, MD;  Location: [facility];  Service: Urology;  Laterality: N/A;   TUBAL  LIGATION       FAMILY HISTORY:  Family History  Problem Relation Age of Onset   Diabetes Mother    Hypothyroidism Mother    Cancer Sister 69       BREAST   Breast cancer Sister    Hypothyroidism Sister    Cancer Maternal Grandmother        COLON   Colon cancer Maternal Grandmother 86   Stomach cancer Neg Hx      SOCIAL HISTORY:  reports that she quit smoking about 3 years ago. Katelyn Kennedy smoking use included cigarettes. She has a 20.00 pack-year smoking history. She has never used smokeless tobacco. She reports that she does not currently use alcohol. She reports that she does not use drugs. The patient is divorced and lives in [place]. She is a retired [employer].   ALLERGIES: Codeine   MEDICATIONS:  Current Outpatient Medications  Medication Sig Dispense Refill   acetaminophen (TYLENOL) 500 MG tablet Take 500 mg by mouth daily as needed for moderate pain or headache.     amitriptyline (ELAVIL) 50 MG tablet Take 1 tablet (50 mg total) by mouth at bedtime. 90 tablet 3   atorvastatin (LIPITOR) 40 MG tablet 1 tablet  po daily 90 tablet 3   Calcium Citrate-Vitamin D (CITRACAL + D PO) Take 1 tablet by mouth daily.     cholecalciferol (VITAMIN D) 25 MCG (1000 UNIT) tablet Take 1,000 Units by mouth daily.     dexamethasone (DECADRON) 2 MG tablet Take 1 tablet (2 mg total) by mouth 2 (two) times daily with a meal. 60 tablet 0   HYDROcodone-acetaminophen (NORCO/VICODIN) 5-325 MG tablet Take 1 tablet by mouth every 4 (four) hours as needed for moderate pain. 20 tablet 0   levETIRAcetam (KEPPRA) 500 MG tablet Take 1 tablet (500 mg total) by mouth 2 (two) times daily. 60 tablet 0   lisinopril (ZESTRIL) 2.5 MG tablet TAKE 1 TABLET(2.5 MG) BY MOUTH DAILY 90 tablet 3   Multiple Vitamin (MULTIVITAMIN) capsule Take 1 capsule by mouth daily. Centrum Silver     vitamin B-12 (CYANOCOBALAMIN) 1000 MCG tablet Take 1,000 mcg by mouth daily.     No current facility-administered medications for this encounter.      REVIEW OF SYSTEMS: On review of systems, the patient reports that she is doing well overall. She has noticed significant improvement in Katelyn Kennedy left sided weakness with only occasional episodes of stinging sensation in Katelyn Kennedy left thigh. She is tapering Katelyn Kennedy steroids and is currently taking Dexamethasone 2 mg BID. No other complaints are verbalized.      PHYSICAL EXAM:  Wt Readings from Last 3 Encounters:  11/21/20 134 lb 6 oz (61 kg)  11/12/20 134 lb 6.4 oz (61 kg)  11/07/20 134 lb 4.8 oz (60.9 kg)   Temp Readings from Last 3 Encounters:  11/21/20 97.9 F (36.6 C) (  Tympanic)  11/12/20 99.2 F (37.3 C) (Tympanic)  11/08/20 99.1 F (37.3 C) (Oral)   BP Readings from Last 3 Encounters:  11/21/20 117/67  11/12/20 117/68  11/08/20 (!) 104/56   Pulse Readings from Last 3 Encounters:  11/21/20 (!) 105  11/12/20 (!) 107  11/08/20 95   Pain Assessment Pain Score: 0-No pain/10  In general this is a well appearing caucasian female in no acute distress. She's alert and oriented x4 and appropriate throughout the examination. Cardiopulmonary assessment is negative for acute distress and she exhibits normal effort.  She does have a well healing craniotomy incision site in the superior scalp. No staples are present, no erythema or separation is noted.   ECOG = 0  0 - Asymptomatic (Fully active, able to carry on all predisease activities without restriction)  1 - Symptomatic but completely ambulatory (Restricted in physically strenuous activity but ambulatory and able to carry out work of a light or sedentary nature. For example, light housework, office work)  2 - Symptomatic, <50% in bed during the day (Ambulatory and capable of all self care but unable to carry out any work activities. Up and about more than 50% of waking hours)  3 - Symptomatic, >50% in bed, but not bedbound (Capable of only limited self-care, confined to bed or chair 50% or more of waking hours)  4 - Bedbound  (Completely disabled. Cannot carry on any self-care. Totally confined to bed or chair)  5 - Death   Eustace Pen MM, Creech RH, Tormey DC, et al. [phone]). "Toxicity and response criteria of the [facility]". [place] Oncol. 5 (6): 649-55    LABORATORY DATA:  Lab Results  Component Value Date   WBC 16.5 (H) 11/11/2020   HGB 13.8 11/11/2020   HCT 38.9 11/11/2020   MCV 90.3 11/11/2020   PLT 210 11/11/2020   Lab Results  Component Value Date   NA 137 11/11/2020   K 4.4 11/11/2020   CL 104 11/11/2020   CO2 26 11/11/2020   Lab Results  Component Value Date   ALT 61 (H) 11/11/2020   AST 34 11/11/2020   ALKPHOS 63 11/11/2020   BILITOT 0.5 11/11/2020      RADIOGRAPHY: CT Chest W Contrast  Result Date: 11/12/2020 CLINICAL DATA:  Metastatic right lung cancer restaging, chemotherapy and immunotherapy, former smoker EXAM: CT CHEST WITH CONTRAST TECHNIQUE: Multidetector CT imaging of the chest was performed during intravenous contrast administration. CONTRAST:  48mL OMNIPAQUE IOHEXOL 350 MG/ML SOLN COMPARISON:  05/13/2020 FINDINGS: Cardiovascular: Aortic atherosclerosis. Normal heart size. Three-vessel coronary artery calcifications. No pericardial effusion. Mediastinum/Nodes: No enlarged mediastinal, hilar, or axillary lymph nodes. Small hiatal hernia. Thyroid gland, trachea, and esophagus demonstrate no significant findings. Lungs/Pleura: Mild centrilobular emphysema. Unchanged, bandlike post treatment/radiation consolidation and fibrosis of the perihilar and paramedian right lung. Unchanged 0.7 cm pulmonary nodule of the dependent right lower lobe (series 5, image 92). Unchanged irregular subsolid nodule of the anterior right apex measuring 0.8 cm (series 5, image 24). Background of very fine centrilobular nodules, most concentrated in the lung apices. No pleural effusion or pneumothorax. Upper Abdomen: No acute abnormality. Unchanged hyperenhancing lesion of the anterior  right lobe of the liver (series 2, image 115). A previously noted similar lesion of the anterior left lobe of the liver is not clearly seen on this examination. Musculoskeletal: No chest wall mass or suspicious bone lesions identified. IMPRESSION: 1. Unchanged, post treatment/radiation consolidation and fibrosis of the right lung. 2. Stable small  solid and sub solid pulmonary nodules. Continued attention on follow-up. 3. Background of very fine centrilobular nodules, most concentrated in the lung apices, most commonly seen in smoking-related respiratory bronchiolitis. 4. Unchanged hyperenhancing lesion of the anterior right lobe of the liver, almost certainly a benign incidental flash filling hemangioma. A previously noted similar lesion of the anterior left lobe of the liver is not clearly seen on this examination, likely very sensitive to precise phase of contrast administration. 5. Emphysema. 6. Coronary artery disease. Aortic Atherosclerosis (ICD10-I70.0) and Emphysema (ICD10-J43.9). Electronically Signed   By: Eddie Candle M.D.   On: 11/12/2020 09:08   MR Brain Wo Contrast  Result Date: 10/30/2020 CLINICAL DATA:  Neuro deficit, acute, stroke suspected acute onset of left arm weakness and left leg weakness/numbness EXAM: MRI HEAD WITHOUT CONTRAST TECHNIQUE: Multiplanar, multiecho pulse sequences of the brain and surrounding structures were obtained without intravenous contrast. COMPARISON:  06/09/2017 FINDINGS: Brain: There is a mass within the superior right frontal lobe with a large amount of surrounding vasogenic edema. Mass measures approximately 3.0 x 2.3 cm. There is some internal hemorrhage of uncertain age. No midline shift. Vascular: Major flow voids are preserved. Skull and upper cervical spine: Normal calvarium and skull base. Visualized upper cervical spine and soft tissues are normal. Sinuses/Orbits:No paranasal sinus fluid levels or advanced mucosal thickening. No mastoid or middle ear  effusion. Normal orbits. IMPRESSION: Right frontal lobe mass, likely metastasis, with large amount of surrounding vasogenic edema and small amount of internal hemorrhage of uncertain age. CT would be helpful to better delineate hemorrhagic component. MRI with contrast would be better able to detect smaller metastases. Electronically Signed   By: Ulyses Jarred M.D.   On: 10/30/2020 22:08   MR BRAIN W WO CONTRAST  Result Date: 11/08/2020 CLINICAL DATA:  Brain/CNS neoplasm, assess treatment response. EXAM: MRI HEAD WITHOUT AND WITH CONTRAST TECHNIQUE: Multiplanar, multiecho pulse sequences of the brain and surrounding structures were obtained without and with intravenous contrast. CONTRAST:  48mL GADAVIST GADOBUTROL 1 MMOL/ML IV SOLN COMPARISON:  MRI of the brain November 01, 2020. FINDINGS: Brain: Interval right frontoparietal craniotomy for resection of right frontal 2 more with dural and parenchymal component. Surgical cavity is seen containing blood products with thin peripheral restricted diffusion and contrast enhancement, likely related to expected postsurgical ischemia. No nodular focus of contrast enhancement seen to suggest residual tumor. Persistent vasogenic edema in the right frontoparietal region, surrounding the surgical cavity. Expected pneumocephalus. No hemorrhage, hydrocephalus, or extra-axial collection Vascular: Normal flow voids. Skull and upper cervical spine: Postsurgical changes from right frontoparietal craniotomy. Marrow signal characteristics are otherwise maintained. Sinuses/Orbits: Bilateral lens surgery. Paranasal sinuses are clear. Other: None. IMPRESSION: Expected postsurgical changes from right frontoparietal craniotomy and resection of mass lesion with dural and intraparenchymal components. No focus of nodular contrast enhancement to suggest residual tumor. Continued follow-up suggested. Electronically Signed   By: Pedro Earls M.D.   On: 11/08/2020 17:19   MR Brain  W Wo Contrast  Result Date: 11/02/2020 CLINICAL DATA:  Brain/CNS neoplasm. Lung cancer and bladder cancer. Left-sided weakness. EXAM: MRI HEAD WITHOUT AND WITH CONTRAST TECHNIQUE: Multiplanar, multiecho pulse sequences of the brain and surrounding structures were obtained without and with intravenous contrast. CONTRAST:  35mL MULTIHANCE GADOBENATE DIMEGLUMINE 529 MG/ML IV SOLN COMPARISON:  MR head without contrast 10/30/2020. MR head without and with contrast 06/09/2017 FINDINGS: Brain: Solitary enhancing lesion is again noted over the right frontal convexity. A dural component is present. Lesion measures 2.5 x  2.9 x 2.8 cm. Mildly restricted diffusion is present within the lesion. Peripheral susceptibility likely represents calcification. There is a focal area of T1 shortening near the superior aspect of the lesion measuring 8 mm which may represent hemorrhage. Marked surrounding vasogenic edema is present, spread throughout the right frontal lobe with some mass effect and sulcal effacement. No significant midline shift. CSF can be seen around the lesion on the coronal T2 weighted imaging consistent with an extra-axial lesion. Minimal periventricular white matter disease is otherwise within normal limits. The internal auditory canals are within normal limits. The brainstem and cerebellum are within normal limits. No other pathologic enhancement is present. Vascular: Flow is present in the major intracranial arteries. Skull and upper cervical spine: The craniocervical junction is normal. Upper cervical spine is within normal limits. Marrow signal is unremarkable. Sinuses/Orbits: The paranasal sinuses and mastoid air cells are clear. Bilateral lens replacements are noted. Globes and orbits are otherwise unremarkable. IMPRESSION: 1. Solitary enhancing lesion over the right frontal convexity measures 2.5 x 2.9 x 2.8 cm with prominent surrounding vasogenic edema spread throughout the right frontal lobe. The lesion  appears to be extra-axial with a clear dural component and mild restricted diffusion. 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 Dural metastasis is considered less likely. 2. Focal area of T1 shortening near the superior aspect of the lesion may represent hemorrhage. 3. No other evidence for metastatic disease to the brain. Electronically Signed   By: San Morelle M.D.   On: 11/02/2020 09:20        IMPRESSION/PLAN: 1. 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 Dr. Lisbeth Renshaw discusses the pathology findings and reviews the nature of brain disease. Katelyn Kennedy case has been discussed in multidisciplinary brain oncology conference. She is a good candidate based on preop and postoperative imaging for a course of postoperative stereotactic radiosurgery [facility]). We discussed the risks, benefits, short, and long term effects of radiotherapy, as well as the curative intent, and the patient is interested in proceeding. Dr. Lisbeth Renshaw discusses the delivery and logistics of radiotherapy and anticipates a course of 3-5 fractions of treatment once she's had Katelyn Kennedy additional planning MRI next week. Written consent is obtained and placed in the chart, a copy was provided to the patient. She will simulate on Tuesday of next week, and Katelyn Kennedy first fraction will be on 11/29/20. She will also follow up with Dr. Julien Nordmann after PET scan to discuss next steps for either systemic therapy or continued surveillance. 2. Claustrophobia. We discussed the patient's concerns and after recommending Ativan prior to imaging, simulation and treatment, the patient is in agreement. We reviewed the side effect profile and a new prescription will be sent to Katelyn Kennedy pharmacy. She is aware to avoid taking this at times she plans to take amitriptyline or pain medication. 3. 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  The patient will continue Katelyn Kennedy follow-up with urology in [place] with Dr. Janese Banks.     In a visit lasting 45  minutes, greater than 50% of the time was spent face to face discussing the patient's condition, in preparation for the discussion, and coordinating the patient's care.   The above documentation reflects my direct findings during this shared patient visit. Please see the separate note by Dr. Lisbeth Renshaw on this date for the remainder of the patient's plan of care.    Carola Rhine, PAC

## 2020-11-22 ENCOUNTER — Ambulatory Visit (INDEPENDENT_AMBULATORY_CARE_PROVIDER_SITE_OTHER): Payer: PPO | Admitting: Family Medicine

## 2020-11-22 DIAGNOSIS — E119 Type 2 diabetes mellitus without complications: Secondary | ICD-10-CM | POA: Diagnosis not present

## 2020-11-22 DIAGNOSIS — Z9841 Cataract extraction status, right eye: Secondary | ICD-10-CM | POA: Diagnosis not present

## 2020-11-22 DIAGNOSIS — E1122 Type 2 diabetes mellitus with diabetic chronic kidney disease: Secondary | ICD-10-CM | POA: Diagnosis not present

## 2020-11-22 DIAGNOSIS — C3431 Malignant neoplasm of lower lobe, right bronchus or lung: Secondary | ICD-10-CM | POA: Diagnosis not present

## 2020-11-22 DIAGNOSIS — Z9842 Cataract extraction status, left eye: Secondary | ICD-10-CM | POA: Diagnosis not present

## 2020-11-22 DIAGNOSIS — Z8701 Personal history of pneumonia (recurrent): Secondary | ICD-10-CM | POA: Diagnosis not present

## 2020-11-22 DIAGNOSIS — Z483 Aftercare following surgery for neoplasm: Secondary | ICD-10-CM | POA: Diagnosis not present

## 2020-11-22 DIAGNOSIS — M1990 Unspecified osteoarthritis, unspecified site: Secondary | ICD-10-CM | POA: Diagnosis not present

## 2020-11-22 DIAGNOSIS — Z85118 Personal history of other malignant neoplasm of bronchus and lung: Secondary | ICD-10-CM | POA: Diagnosis not present

## 2020-11-22 DIAGNOSIS — C7931 Secondary malignant neoplasm of brain: Secondary | ICD-10-CM

## 2020-11-22 DIAGNOSIS — I129 Hypertensive chronic kidney disease with stage 1 through stage 4 chronic kidney disease, or unspecified chronic kidney disease: Secondary | ICD-10-CM | POA: Diagnosis not present

## 2020-11-22 DIAGNOSIS — Z87891 Personal history of nicotine dependence: Secondary | ICD-10-CM | POA: Diagnosis not present

## 2020-11-22 DIAGNOSIS — Z8781 Personal history of (healed) traumatic fracture: Secondary | ICD-10-CM | POA: Diagnosis not present

## 2020-11-22 DIAGNOSIS — Z85841 Personal history of malignant neoplasm of brain: Secondary | ICD-10-CM | POA: Diagnosis not present

## 2020-11-22 DIAGNOSIS — E039 Hypothyroidism, unspecified: Secondary | ICD-10-CM | POA: Diagnosis not present

## 2020-11-22 DIAGNOSIS — E1151 Type 2 diabetes mellitus with diabetic peripheral angiopathy without gangrene: Secondary | ICD-10-CM | POA: Diagnosis not present

## 2020-11-22 DIAGNOSIS — R978 Other abnormal tumor markers: Secondary | ICD-10-CM | POA: Diagnosis not present

## 2020-11-22 DIAGNOSIS — Z9181 History of falling: Secondary | ICD-10-CM | POA: Diagnosis not present

## 2020-11-22 DIAGNOSIS — Z7951 Long term (current) use of inhaled steroids: Secondary | ICD-10-CM | POA: Diagnosis not present

## 2020-11-22 DIAGNOSIS — N189 Chronic kidney disease, unspecified: Secondary | ICD-10-CM | POA: Diagnosis not present

## 2020-11-22 NOTE — Assessment & Plan Note (Signed)
Followed by Dr. Julien Nordmann Oncology

## 2020-11-22 NOTE — Assessment & Plan Note (Signed)
Glucose elevated  On 11/11/2020 labs given pt on  Steroid. Now on lower dose steroid.   Has routine labs with ONC.Marland Kitchen if not improving  Or if needs longer term steroids, she may need medication to treat.

## 2020-11-22 NOTE — Progress Notes (Signed)
Patient ID: Katelyn Kennedy, female    DOB: 1945/12/13, 75 y.o.   MRN: [id number]  This visit was conducted in person.  BP (!) 114/58   Pulse 100   Temp 98 F (36.7 C) (Temporal)   Ht 4\' 11"  (1.499 m)   Wt 135 lb (61.2 kg)   SpO2 95%   BMI 27.27 kg/m    CC:  Chief Complaint  Patient presents with   Hospitalization Follow-up    Subjective:   HPI: Katelyn Kennedy is a 75 y.o. female presenting on 11/22/2020 for Hospitalization Follow-up  S/P hospital admission 11/07/2020 to 11/08/20  Dr. Annette Stable Neurosurgeon.  Admitted for uncomplicated craniotomy and resection of right frontal tumor... pathology showing metastatic carcinoma.  Planning stereotatic radiosurgery to tumor bed.    11/12/2020 OV with Dr. Julien Nordmann Oncology ( reviewed note in detail) "The patient had repeat CT scan of the chest performed recently.  I personally and independently reviewed the scan and discussed the results with the patient and her daughter. Her scan showed no concerning findings for disease progression in the chest. I recommended for her to continue on observation but I will repeat PET scan in 6 weeks to rule out any other metastatic disease especially with the recent brain metastasis. For the solitary brain metastasis s/p resection, the patient is expected to have Amorita to this lesion soon."  Today she is with her daughter and reports she has minimal numbness,  she is back to her baseline lower and upper extremity strength. She is doing PT once a week.    She reports no pain, no dizziness, no headache, no neuro changes.  She is on lower dose of steroid and seizure medication now.  BP Readings from Last 3 Encounters:  11/22/20 (!) 114/58  11/21/20 117/67  11/12/20 117/68         Relevant past medical, surgical, family and social history reviewed and updated as indicated. Interim medical history since our last visit reviewed. Allergies and medications reviewed and updated. Outpatient  Medications Prior to Visit  Medication Sig Dispense Refill   acetaminophen (TYLENOL) 500 MG tablet Take 500 mg by mouth daily as needed for moderate pain or headache.     amitriptyline (ELAVIL) 50 MG tablet Take 1 tablet (50 mg total) by mouth at bedtime. 90 tablet 3   atorvastatin (LIPITOR) 40 MG tablet 1 tablet  po daily 90 tablet 3   Calcium Citrate-Vitamin D (CITRACAL + D PO) Take 1 tablet by mouth daily.     cholecalciferol (VITAMIN D) 25 MCG (1000 UNIT) tablet Take 1,000 Units by mouth daily.     dexamethasone (DECADRON) 2 MG tablet Take 1 tablet (2 mg total) by mouth 2 (two) times daily with a meal. 60 tablet 0   HYDROcodone-acetaminophen (NORCO/VICODIN) 5-325 MG tablet Take 1 tablet by mouth every 4 (four) hours as needed for moderate pain. 20 tablet 0   levETIRAcetam (KEPPRA) 500 MG tablet Take 1 tablet (500 mg total) by mouth 2 (two) times daily. 60 tablet 0   lisinopril (ZESTRIL) 2.5 MG tablet TAKE 1 TABLET(2.5 MG) BY MOUTH DAILY 90 tablet 3   LORazepam (ATIVAN) 0.5 MG tablet 1 tab po 30 minutes prior to radiation or MRI scans 30 tablet 0   Multiple Vitamin (MULTIVITAMIN) capsule Take 1 capsule by mouth daily. Centrum Silver     vitamin B-12 (CYANOCOBALAMIN) 1000 MCG tablet Take 1,000 mcg by mouth daily.     No facility-administered medications prior to visit.  Per HPI unless specifically indicated in ROS section below Review of Systems  Constitutional:  Negative for fatigue and fever.  HENT:  Negative for ear pain.   Eyes:  Negative for pain.  Respiratory:  Negative for chest tightness and shortness of breath.   Cardiovascular:  Negative for chest pain, palpitations and leg swelling.  Gastrointestinal:  Negative for abdominal pain.  Genitourinary:  Negative for dysuria.  Objective:  BP (!) 114/58   Pulse 100   Temp 98 F (36.7 C) (Temporal)   Ht 4\' 11"  (1.499 m)   Wt 135 lb (61.2 kg)   SpO2 95%   BMI 27.27 kg/m   Wt Readings from Last 3 Encounters:  11/22/20 135  lb (61.2 kg)  11/21/20 134 lb 6 oz (61 kg)  11/12/20 134 lb 6.4 oz (61 kg)      Physical Exam Constitutional:      General: She is not in acute distress.    Appearance: Normal appearance. She is well-developed. She is not ill-appearing or toxic-appearing.  HENT:     Head: Normocephalic.     Right Ear: Hearing, tympanic membrane, ear canal and external ear normal. Tympanic membrane is not erythematous, retracted or bulging.     Left Ear: Hearing, tympanic membrane, ear canal and external ear normal. Tympanic membrane is not erythematous, retracted or bulging.     Nose: No mucosal edema or rhinorrhea.     Right Sinus: No maxillary sinus tenderness or frontal sinus tenderness.     Left Sinus: No maxillary sinus tenderness or frontal sinus tenderness.     Mouth/Throat:     Pharynx: Uvula midline.  Eyes:     General: Lids are normal. Lids are everted, no foreign bodies appreciated.     Conjunctiva/sclera: Conjunctivae normal.     Pupils: Pupils are equal, round, and reactive to light.  Neck:     Thyroid: No thyroid mass or thyromegaly.     Vascular: No carotid bruit.     Trachea: Trachea normal.  Cardiovascular:     Rate and Rhythm: Normal rate and regular rhythm.     Pulses: Normal pulses.     Heart sounds: Normal heart sounds, S1 normal and S2 normal. No murmur heard.   No friction rub. No gallop.  Pulmonary:     Effort: Pulmonary effort is normal. No tachypnea or respiratory distress.     Breath sounds: Normal breath sounds. No decreased breath sounds, wheezing, rhonchi or rales.  Abdominal:     General: Bowel sounds are normal.     Palpations: Abdomen is soft.     Tenderness: There is no abdominal tenderness.  Musculoskeletal:     Cervical back: Normal range of motion and neck supple.     Comments:  Upper ext 5/5 strength   Lower ext 5/5 strength  Skin:    General: Skin is warm and dry.     Findings: No rash.  Neurological:     Mental Status: She is alert and oriented to  person, place, and time.     Cranial Nerves: Cranial nerves are intact.     Sensory: Sensation is intact.     Coordination: Coordination is intact.     Gait: Gait is intact.  Psychiatric:        Mood and Affect: Mood is not anxious or depressed.        Speech: Speech normal.        Behavior: Behavior normal. Behavior is cooperative.  Thought Content: Thought content normal.        Judgment: Judgment normal.      Results for orders placed or performed in visit on 11/11/20  CMP ([place] only)  Result Value Ref Range   Sodium 137 135 - 145 mmol/L   Potassium 4.4 3.5 - 5.1 mmol/L   Chloride 104 98 - 111 mmol/L   CO2 26 22 - 32 mmol/L   Glucose, Bld 206 (H) 70 - 99 mg/dL   BUN 24 (H) 8 - 23 mg/dL   Creatinine 0.80 0.44 - 1.00 mg/dL   Calcium 9.9 8.9 - 10.3 mg/dL   Total Protein 6.0 (L) 6.5 - 8.1 g/dL   Albumin 2.9 (L) 3.5 - 5.0 g/dL   AST 34 15 - 41 U/L   ALT 61 (H) 0 - 44 U/L   Alkaline Phosphatase 63 38 - 126 U/L   Total Bilirubin 0.5 0.3 - 1.2 mg/dL   GFR, Estimated >60 >60 mL/min   Anion gap 7 5 - 15  CBC with Differential ([facility] Only)  Result Value Ref Range   WBC Count 16.5 (H) 4.0 - 10.5 K/uL   RBC 4.31 3.87 - 5.11 MIL/uL   Hemoglobin 13.8 12.0 - 15.0 g/dL   HCT 38.9 36.0 - 46.0 %   MCV 90.3 80.0 - 100.0 fL   MCH 32.0 26.0 - 34.0 pg   MCHC 35.5 30.0 - 36.0 g/dL   RDW 12.8 11.5 - 15.5 %   Platelet Count 210 150 - 400 K/uL   nRBC 0.0 0.0 - 0.2 %   Neutrophils Relative % 81 %   Neutro Abs 13.6 (H) 1.7 - 7.7 K/uL   Lymphocytes Relative 9 %   Lymphs Abs 1.4 0.7 - 4.0 K/uL   Monocytes Relative 7 %   Monocytes Absolute 1.1 (H) 0.1 - 1.0 K/uL   Eosinophils Relative 0 %   Eosinophils Absolute 0.0 0.0 - 0.5 K/uL   Basophils Relative 1 %   Basophils Absolute 0.1 0.0 - 0.1 K/uL   Immature Granulocytes 2 %   Abs Immature Granulocytes 0.32 (H) 0.00 - 0.07 K/uL    This visit occurred during the SARS-CoV-2 public health emergency.  Safety protocols were  in place, including screening questions prior to the visit, additional usage of staff PPE, and extensive cleaning of exam room while observing appropriate contact time as indicated for disinfecting solutions.   COVID 19 screen:  No recent travel or known exposure to COVID19 The patient denies respiratory symptoms of COVID 19 at this time. The importance of social distancing was discussed today.   Assessment and Plan Problem List Items Addressed This Visit     Diabetes mellitus with no complication (HCC) (Chronic)     Glucose elevated  On 11/11/2020 labs given pt on  Steroid. Now on lower dose steroid.   Has routine labs with ONC.Marland Kitchen if not improving  Or if needs longer term steroids, she may need medication to treat.      Brain metastasis ([place])    S/P surgical resection. Doing very well. Weakness and numbness resolved almost entirely.  Continue PT.  Will follow along with  Oncology and neurosurgeon with planned radiation to tumor bed and  6 week PET scan.      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 Julien Nordmann Oncology      Lab Results  Component Value Date   HGBA1C 5.9 10/11/2020  Eliezer Lofts, MD

## 2020-11-22 NOTE — Patient Instructions (Signed)
I am so glad you are feeling better!

## 2020-11-22 NOTE — Assessment & Plan Note (Signed)
S/P surgical resection. Doing very well. Weakness and numbness resolved almost entirely.  Continue PT.  Will follow along with  Oncology and neurosurgeon with planned radiation to tumor bed and  6 week PET scan.

## 2020-11-25 NOTE — Progress Notes (Signed)
Has armband been applied?  Yes  Does patient have an allergy to IV contrast dye?: No   Has patient ever received premedication for IV contrast dye?:  n/a  Does patient take metformin?: No  If patient does take metformin when was the last dose: n/a  Date of lab work: 11/11/2020 BUN: 24 CR: 0.81 eGfr: >60   IV site: Right AC  Has IV site been added to flowsheet?  Yes  BP 122/74   Pulse (!) 107   Resp 20   Ht 4' 11" (1.499 m)   Wt 133 lb (60.3 kg)   SpO2 97%   BMI 26.86 kg/m

## 2020-11-26 ENCOUNTER — Other Ambulatory Visit: Payer: Self-pay | Admitting: Radiation Therapy

## 2020-11-26 ENCOUNTER — Other Ambulatory Visit: Payer: Self-pay

## 2020-11-26 ENCOUNTER — Ambulatory Visit
Admission: RE | Admit: 2020-11-26 | Discharge: 2020-11-26 | Disposition: A | Discharge status: Home / Self Care | Payer: PPO | Source: Ambulatory Visit | Attending: Radiation Oncology | Admitting: Radiation Oncology

## 2020-11-26 VITALS — BP 122/74 | HR 107 | Resp 20 | Ht 59.0 in | Wt 133.0 lb

## 2020-11-26 DIAGNOSIS — C3431 Malignant neoplasm of lower lobe, right bronchus or lung: Secondary | ICD-10-CM | POA: Insufficient documentation

## 2020-11-26 DIAGNOSIS — C7931 Secondary malignant neoplasm of brain: Secondary | ICD-10-CM | POA: Diagnosis not present

## 2020-11-26 DIAGNOSIS — Z510 Encounter for antineoplastic radiation therapy: Secondary | ICD-10-CM | POA: Diagnosis not present

## 2020-11-26 MED ORDER — SODIUM CHLORIDE 0.9% FLUSH
10.0000 mL | Freq: Once | INTRAVENOUS | Status: AC
  Administered 2020-11-26: 10 mL via INTRAVENOUS

## 2020-11-26 NOTE — Addendum Note (Signed)
Encounter addended by: Kyung Rudd, MD on: 11/26/2020 9:07 AM  Actions taken: Edit attestation on clinical note

## 2020-11-27 ENCOUNTER — Ambulatory Visit
Admission: RE | Admit: 2020-11-27 | Discharge: 2020-11-27 | Disposition: A | Discharge status: Home / Self Care | Payer: PPO | Source: Ambulatory Visit | Attending: Radiation Oncology | Admitting: Radiation Oncology

## 2020-11-27 DIAGNOSIS — Z86011 Personal history of benign neoplasm of the brain: Secondary | ICD-10-CM | POA: Diagnosis not present

## 2020-11-27 DIAGNOSIS — Z85841 Personal history of malignant neoplasm of brain: Secondary | ICD-10-CM | POA: Diagnosis not present

## 2020-11-27 DIAGNOSIS — C7931 Secondary malignant neoplasm of brain: Secondary | ICD-10-CM

## 2020-11-27 DIAGNOSIS — G936 Cerebral edema: Secondary | ICD-10-CM | POA: Diagnosis not present

## 2020-11-27 MED ORDER — GADOBENATE DIMEGLUMINE 529 MG/ML IV SOLN
15.0000 mL | Freq: Once | INTRAVENOUS | Status: AC | PRN
  Administered 2020-11-27: 15 mL via INTRAVENOUS

## 2020-11-28 DIAGNOSIS — Z510 Encounter for antineoplastic radiation therapy: Secondary | ICD-10-CM | POA: Diagnosis not present

## 2020-11-28 DIAGNOSIS — C7931 Secondary malignant neoplasm of brain: Secondary | ICD-10-CM | POA: Diagnosis not present

## 2020-11-28 DIAGNOSIS — C3431 Malignant neoplasm of lower lobe, right bronchus or lung: Secondary | ICD-10-CM | POA: Diagnosis not present

## 2020-11-29 ENCOUNTER — Ambulatory Visit
Admission: RE | Admit: 2020-11-29 | Discharge: 2020-11-29 | Disposition: A | Discharge status: Home / Self Care | Payer: PPO | Source: Ambulatory Visit | Attending: Radiation Oncology | Admitting: Radiation Oncology

## 2020-11-29 ENCOUNTER — Other Ambulatory Visit: Payer: Self-pay

## 2020-11-29 DIAGNOSIS — C7931 Secondary malignant neoplasm of brain: Secondary | ICD-10-CM | POA: Diagnosis not present

## 2020-11-29 DIAGNOSIS — Z510 Encounter for antineoplastic radiation therapy: Secondary | ICD-10-CM | POA: Diagnosis not present

## 2020-11-29 NOTE — Progress Notes (Signed)
[name] rested with [name] for 15 minutes following her [place] treatment.  Patient denies headache, dizziness, nausea, diplopia or ringing in the ears. Denies fatigue. Patient without complaints. Understands to avoid strenuous activity for the next 24 hours and call [phone] with needs.   BP 118/72   Pulse (!) 111   Temp 98.3 F (36.8 C)   Resp 18   SpO2 98%    Isaac Dubie M. Leonie Green, BSN

## 2020-11-29 NOTE — Op Note (Signed)
  Name: Katelyn Kennedy  MRN: [id number]  Date: 11/29/2020   DOB: [birth year]  Stereotactic Radiosurgery Operative Note  PRE-OPERATIVE DIAGNOSIS:  Solitary Brain Metastasis  POST-OPERATIVE DIAGNOSIS:  Solitary Brain Metastasis  PROCEDURE:  Stereotactic Radiosurgery  SURGEON:  Charlie Pitter, MD  NARRATIVE: The patient underwent a radiation treatment planning session in the radiation oncology simulation suite under the care of the radiation oncology physician and physicist.  I participated closely in the radiation treatment planning afterwards. The patient underwent planning CT which was fused to 3T high resolution MRI with 1 mm axial slices.  These images were fused on the planning system.  We contoured the gross target volumes and subsequently expanded this to yield the Planning Target Volume. I actively participated in the planning process.  I helped to define and review the target contours and also the contours of the optic pathway, eyes, brainstem and selected nearby organs at risk.  All the dose constraints for critical structures were reviewed and compared to AAPM Task Group 101.  The prescription dose conformity was reviewed.  I approved the plan electronically.    Accordingly, [name] was brought to the TrueBeam stereotactic radiation treatment linac and placed in the custom immobilization mask.  The patient was aligned according to the IR fiducial markers with BrainLab Exactrac, then orthogonal x-rays were used in ExacTrac with the 6DOF robotic table and the shifts were made to align the patient  [name] received stereotactic radiosurgery uneventfully.    The detailed description of the procedure is recorded in the radiation oncology procedure note.  I was present for the duration of the procedure.  DISPOSITION:  Following delivery, the patient was transported to nursing in stable condition and monitored for possible acute effects to be discharged to home in stable condition  with follow-up in one month.  Charlie Pitter, MD 11/29/2020 2:56 PM

## 2020-12-02 ENCOUNTER — Other Ambulatory Visit: Payer: Self-pay

## 2020-12-02 ENCOUNTER — Ambulatory Visit
Admission: RE | Admit: 2020-12-02 | Discharge: 2020-12-02 | Disposition: A | Discharge status: Home / Self Care | Payer: PPO | Source: Ambulatory Visit | Attending: Radiation Oncology | Admitting: Radiation Oncology

## 2020-12-02 VITALS — BP 116/65 | HR 105 | Temp 96.7°F | Resp 18

## 2020-12-02 DIAGNOSIS — Z510 Encounter for antineoplastic radiation therapy: Secondary | ICD-10-CM | POA: Diagnosis not present

## 2020-12-02 DIAGNOSIS — C7931 Secondary malignant neoplasm of brain: Secondary | ICD-10-CM

## 2020-12-02 NOTE — Addendum Note (Signed)
Encounter addended by: Zola Button, RN on: 12/02/2020 3:28 PM  Actions taken: Vitals modified, Clinical Note Signed

## 2020-12-02 NOTE — Progress Notes (Signed)
Nurse monitoring complete status post 2 of 3 SRS treatments. Patient without complaints. Patient denies new or worsening neurologic symptoms. Vitals stable. Instructed patient to avoid strenuous activity for the next 24 hours. Instructed patient to not miss any of her decadron doses Instructed patient to call [phone] with needs related to treatment after hours or over the weekend. Patient verbalized understanding. Ambulated out of clinic unassisted to friend's vehicle, without incident   Vitals:   12/02/20 1450  BP: 116/65  Pulse: (!) 105  Resp: 18  Temp: (!) 96.7 F (35.9 C)  SpO2: 98%

## 2020-12-04 ENCOUNTER — Ambulatory Visit
Admission: RE | Admit: 2020-12-04 | Discharge: 2020-12-04 | Disposition: A | Discharge status: Home / Self Care | Payer: PPO | Source: Ambulatory Visit | Attending: Radiation Oncology | Admitting: Radiation Oncology

## 2020-12-04 ENCOUNTER — Encounter: Payer: Self-pay | Admitting: Radiation Oncology

## 2020-12-04 VITALS — BP 117/73 | HR 104 | Temp 97.3°F | Resp 18

## 2020-12-04 DIAGNOSIS — C7931 Secondary malignant neoplasm of brain: Secondary | ICD-10-CM | POA: Diagnosis not present

## 2020-12-04 DIAGNOSIS — Z510 Encounter for antineoplastic radiation therapy: Secondary | ICD-10-CM | POA: Diagnosis not present

## 2020-12-04 DIAGNOSIS — C3431 Malignant neoplasm of lower lobe, right bronchus or lung: Secondary | ICD-10-CM | POA: Diagnosis not present

## 2020-12-04 NOTE — Progress Notes (Signed)
Katelyn Kennedy rested with [name] following her final [name] treatment. Patient without complaints. Patient denies new or worsening neurologic symptoms. Vitals stable. Instructed patient to avoid strenuous activity for the next 24 hours. Instructed patient to not miss any of her decadron doses Instructed patient to call [phone] with needs related to treatment after hours or over the weekend. Patient was instructed to call Dr. Irven [place] office to get steroid taper instructions.  Patient verbalized understanding. Ambulated out of clinic unassisted to friend's vehicle, without incident.    Vitals:   12/04/20 1449  BP: 117/73  Pulse: (!) 104  Resp: 18  Temp: (!) 97.3 F (36.3 C)  SpO2: 97%      [name] M. Leonie Green, BSN

## 2020-12-05 ENCOUNTER — Telehealth: Payer: Self-pay | Admitting: Internal Medicine

## 2020-12-05 NOTE — Telephone Encounter (Signed)
Patient called in to confirm f/u appt.

## 2020-12-09 ENCOUNTER — Encounter: Payer: Self-pay | Admitting: Internal Medicine

## 2020-12-09 NOTE — Progress Notes (Signed)
                                                                                                                                                             Patient Name: Katelyn Kennedy MRN: [id number] DOB: Jun 07, 1945 Referring Physician: Earnie Larsson, M.D. 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  Intent: Palliative  Radiation Treatment Dates: 11/29/2020 through 12/04/2020 Postoperative SRS Site Technique Total Dose (Gy) Dose per Fx (Gy) Completed Fx Beam Energies  Brain: Brain IMRT 27/27 9 3/3 6XFFF   Narrative: The patient tolerated radiation therapy relatively well.   Plan: The patient will receive a call in about one month from the radiation oncology department. She will continue follow up with Dr. Julien Nordmann as well as with Dr. Mickeal Skinner in surveillance.   ________________________________________________ .   Carola Rhine, PAC

## 2020-12-12 ENCOUNTER — Inpatient Hospital Stay (HOSPITAL_COMMUNITY): Admission: RE | Admit: 2020-12-12 | Payer: PPO | Source: Ambulatory Visit

## 2020-12-12 ENCOUNTER — Other Ambulatory Visit (HOSPITAL_COMMUNITY): Payer: Self-pay | Admitting: Neurosurgery

## 2020-12-12 DIAGNOSIS — R600 Localized edema: Secondary | ICD-10-CM

## 2020-12-13 ENCOUNTER — Other Ambulatory Visit: Payer: Self-pay

## 2020-12-13 ENCOUNTER — Telehealth: Payer: Self-pay | Admitting: Medical Oncology

## 2020-12-13 ENCOUNTER — Ambulatory Visit (HOSPITAL_COMMUNITY)
Admission: RE | Admit: 2020-12-13 | Discharge: 2020-12-13 | Disposition: A | Discharge status: Home / Self Care | Payer: PPO | Source: Ambulatory Visit | Attending: Neurosurgery | Admitting: Neurosurgery

## 2020-12-13 ENCOUNTER — Other Ambulatory Visit: Payer: Self-pay | Admitting: Physician Assistant

## 2020-12-13 DIAGNOSIS — I824Y3 Acute embolism and thrombosis of unspecified deep veins of proximal lower extremity, bilateral: Secondary | ICD-10-CM

## 2020-12-13 DIAGNOSIS — R600 Localized edema: Secondary | ICD-10-CM

## 2020-12-13 MED ORDER — APIXABAN 5 MG PO TABS: ORAL_TABLET | ORAL | 0 refills | Status: DC

## 2020-12-13 NOTE — Progress Notes (Signed)
The patient had a lower extremity ultrasound performed today which was ordered by her neurologist. Her ultrasound was positive for DVT. Neurology trying to get in touch with her. Neurology wondering if we would manage her blood thinner. I would like to ensure she gets treated for her acute DVT in a timely manner so I have sent the prescription for eliquis starter pack to her pharmacy. I tried to contact the patient to discuss this prescription with her in more detail but I was unable to reach her. I sent the prescription to her pharmacy on file and left my call back number and advised her to call me to discuss the medication in more detail.

## 2020-12-13 NOTE — Telephone Encounter (Signed)
Positive for bilateral DVT- Dr. Trenton Gammon saw pt yesterday and pt reported bilateral  lower extremity edema.  Vascular US showed positive DVT both legs.  Cassie started pt on Blood thinner.  Dr Orvis Brill nurse was unable to get in touch with pt by phone . She will call pts daughter.

## 2020-12-17 ENCOUNTER — Telehealth: Payer: Self-pay | Admitting: Physician Assistant

## 2020-12-17 NOTE — Telephone Encounter (Signed)
Opened in error

## 2020-12-17 NOTE — Telephone Encounter (Signed)
I called the patient to review education about the recently diagnosed DVT. Advised her to avoid NSAIDs and aspirin as it may increase the risk of bleeding. When she completes the starter pack, she was advised to call our office for a refill. I also reviewed signs and symptoms of a pulmonary embolism with her and advised that seek emergency evaluation if she had chest pain, shortness of breath, light headedness, syncope, etc. She also was advised to seek emergency evaluation if she has significant signs or symptoms of bleeding.

## 2020-12-20 ENCOUNTER — Inpatient Hospital Stay: Payer: PPO

## 2020-12-23 ENCOUNTER — Inpatient Hospital Stay: Payer: PPO | Admitting: Internal Medicine

## 2020-12-24 ENCOUNTER — Inpatient Hospital Stay: Payer: PPO | Attending: Internal Medicine

## 2020-12-24 ENCOUNTER — Other Ambulatory Visit: Payer: Self-pay

## 2020-12-24 ENCOUNTER — Ambulatory Visit (HOSPITAL_COMMUNITY)
Admission: RE | Admit: 2020-12-24 | Discharge: 2020-12-24 | Disposition: A | Discharge status: Home / Self Care | Payer: PPO | Source: Ambulatory Visit | Attending: Internal Medicine | Admitting: Internal Medicine

## 2020-12-24 DIAGNOSIS — E042 Nontoxic multinodular goiter: Secondary | ICD-10-CM | POA: Diagnosis not present

## 2020-12-24 DIAGNOSIS — I999 Unspecified disorder of circulatory system: Secondary | ICD-10-CM | POA: Diagnosis not present

## 2020-12-24 DIAGNOSIS — Z9221 Personal history of antineoplastic chemotherapy: Secondary | ICD-10-CM | POA: Insufficient documentation

## 2020-12-24 DIAGNOSIS — C7931 Secondary malignant neoplasm of brain: Secondary | ICD-10-CM | POA: Diagnosis not present

## 2020-12-24 DIAGNOSIS — Z7952 Long term (current) use of systemic steroids: Secondary | ICD-10-CM | POA: Insufficient documentation

## 2020-12-24 DIAGNOSIS — C3490 Malignant neoplasm of unspecified part of unspecified bronchus or lung: Secondary | ICD-10-CM | POA: Insufficient documentation

## 2020-12-24 DIAGNOSIS — M7989 Other specified soft tissue disorders: Secondary | ICD-10-CM | POA: Diagnosis not present

## 2020-12-24 DIAGNOSIS — I10 Essential (primary) hypertension: Secondary | ICD-10-CM | POA: Insufficient documentation

## 2020-12-24 DIAGNOSIS — I739 Peripheral vascular disease, unspecified: Secondary | ICD-10-CM | POA: Diagnosis not present

## 2020-12-24 DIAGNOSIS — C3432 Malignant neoplasm of lower lobe, left bronchus or lung: Secondary | ICD-10-CM | POA: Diagnosis not present

## 2020-12-24 DIAGNOSIS — K449 Diaphragmatic hernia without obstruction or gangrene: Secondary | ICD-10-CM | POA: Insufficient documentation

## 2020-12-24 DIAGNOSIS — I700 Atherosclerosis of aorta: Secondary | ICD-10-CM | POA: Insufficient documentation

## 2020-12-24 DIAGNOSIS — I2510 Atherosclerotic heart disease of native coronary artery without angina pectoris: Secondary | ICD-10-CM | POA: Diagnosis not present

## 2020-12-24 DIAGNOSIS — Z79899 Other long term (current) drug therapy: Secondary | ICD-10-CM | POA: Diagnosis not present

## 2020-12-24 DIAGNOSIS — E1151 Type 2 diabetes mellitus with diabetic peripheral angiopathy without gangrene: Secondary | ICD-10-CM | POA: Diagnosis not present

## 2020-12-24 DIAGNOSIS — D171 Benign lipomatous neoplasm of skin and subcutaneous tissue of trunk: Secondary | ICD-10-CM | POA: Diagnosis not present

## 2020-12-24 DIAGNOSIS — K5730 Diverticulosis of large intestine without perforation or abscess without bleeding: Secondary | ICD-10-CM | POA: Diagnosis not present

## 2020-12-24 DIAGNOSIS — Z87891 Personal history of nicotine dependence: Secondary | ICD-10-CM | POA: Insufficient documentation

## 2020-12-24 DIAGNOSIS — Z923 Personal history of irradiation: Secondary | ICD-10-CM | POA: Insufficient documentation

## 2020-12-24 DIAGNOSIS — Z7901 Long term (current) use of anticoagulants: Secondary | ICD-10-CM | POA: Insufficient documentation

## 2020-12-24 LAB — CBC WITH DIFFERENTIAL (CANCER CENTER ONLY)
Abs Immature Granulocytes: 0.16 10*3/uL — ABNORMAL HIGH (ref 0.00–0.07)
Basophils Absolute: 0 10*3/uL (ref 0.0–0.1)
Basophils Relative: 1 %
Eosinophils Absolute: 0.1 10*3/uL (ref 0.0–0.5)
Eosinophils Relative: 1 %
HCT: 31.6 % — ABNORMAL LOW (ref 36.0–46.0)
Hemoglobin: 10.6 g/dL — ABNORMAL LOW (ref 12.0–15.0)
Immature Granulocytes: 3 %
Lymphocytes Relative: 32 %
Lymphs Abs: 2.1 10*3/uL (ref 0.7–4.0)
MCH: 31.9 pg (ref 26.0–34.0)
MCHC: 33.5 g/dL (ref 30.0–36.0)
MCV: 95.2 fL (ref 80.0–100.0)
Monocytes Absolute: 0.7 10*3/uL (ref 0.1–1.0)
Monocytes Relative: 11 %
Neutro Abs: 3.5 10*3/uL (ref 1.7–7.7)
Neutrophils Relative %: 52 %
Platelet Count: 327 10*3/uL (ref 150–400)
RBC: 3.32 MIL/uL — ABNORMAL LOW (ref 3.87–5.11)
RDW: 14.5 % (ref 11.5–15.5)
WBC Count: 6.5 10*3/uL (ref 4.0–10.5)
nRBC: 0 % (ref 0.0–0.2)

## 2020-12-24 LAB — CMP (CANCER CENTER ONLY)
ALT: 19 U/L (ref 0–44)
AST: 18 U/L (ref 15–41)
Albumin: 3.2 g/dL — ABNORMAL LOW (ref 3.5–5.0)
Alkaline Phosphatase: 94 U/L (ref 38–126)
Anion gap: 11 (ref 5–15)
BUN: 11 mg/dL (ref 8–23)
CO2: 24 mmol/L (ref 22–32)
Calcium: 10 mg/dL (ref 8.9–10.3)
Chloride: 107 mmol/L (ref 98–111)
Creatinine: 0.73 mg/dL (ref 0.44–1.00)
GFR, Estimated: >60 mL/min (ref >60)
Glucose, Bld: 99 mg/dL (ref 70–99)
Potassium: 4.4 mmol/L (ref 3.5–5.1)
Sodium: 142 mmol/L (ref 135–145)
Total Bilirubin: 0.4 mg/dL (ref 0.3–1.2)
Total Protein: 6.7 g/dL (ref 6.5–8.1)

## 2020-12-24 LAB — GLUCOSE, CAPILLARY: Glucose-Capillary: 97 mg/dL (ref 70–99)

## 2020-12-24 MED ORDER — FLUDEOXYGLUCOSE F - 18 (FDG) INJECTION
6.9000 | Freq: Once | INTRAVENOUS | Status: AC
  Administered 2020-12-24: 7.05 via INTRAVENOUS

## 2020-12-26 ENCOUNTER — Other Ambulatory Visit: Payer: Self-pay

## 2020-12-26 ENCOUNTER — Inpatient Hospital Stay: Payer: PPO | Admitting: Internal Medicine

## 2020-12-26 ENCOUNTER — Telehealth: Payer: Self-pay | Admitting: Internal Medicine

## 2020-12-26 VITALS — BP 117/64 | HR 109 | Temp 98.2°F | Resp 19 | Ht 59.0 in | Wt 138.5 lb

## 2020-12-26 DIAGNOSIS — C3490 Malignant neoplasm of unspecified part of unspecified bronchus or lung: Secondary | ICD-10-CM | POA: Diagnosis not present

## 2020-12-26 DIAGNOSIS — C3432 Malignant neoplasm of lower lobe, left bronchus or lung: Secondary | ICD-10-CM | POA: Diagnosis not present

## 2020-12-26 NOTE — Progress Notes (Signed)
[place] [phone]   [phone]  OFFICE PROGRESS NOTE  [name], MD [place] [address]  DIAGNOSIS: Metastatic non-small cell lung cancer, adenocarcinoma initially diagnosed as stage IIIA (T2a, N2, M0) non-small cell lung cancer, adenocarcinoma presenting with right lower lobe lung mass in addition to subcarinal lymphadenopathy and suspicious metastatic pulmonary nodules diagnosed in February 2019.  She has evidence of metastatic disease with solitary brain metastasis in July 2022  PRIOR THERAPY: 1) Concurrent chemoradiation with carboplatin for an AUC of 2 with paclitaxel 45 mg/m weekly. First dose given on 06/28/2017.  Status post 7 cycles.  Last dose was giving August 09, 2017 with partial response. 2) Consolidation immunotherapy with Imfinzi (Durvalumab) 10 mg/KG every 2 weeks, first dose 09/23/2017.  Status post 26 cycles. Last dose was given on Sep 08, 2018. 3) status post craniotomy with resection of right posterior frontal dural based metastasis under the care of Dr. Trenton Gammon on November 07, 2020.  CURRENT THERAPY: Observation.  INTERVAL HISTORY: KEELEIGH Kennedy 75 y.o. female returns to the clinic today for follow-up visit.  The patient is feeling fine today with no concerning complaints.  She denied having any current chest pain, shortness of breath, cough or hemoptysis.  She denied having any nausea, vomiting, diarrhea or constipation.  She has no headache or visual changes.  She continues to have swelling of the lower extremities after her previous treatment with steroids.  The patient had a PET scan performed recently and she is here for evaluation and discussion of her PET scan results and recommendation regarding her condition.   MEDICAL HISTORY: Past Medical History:  Diagnosis Date   Allergic rhinitis    Arthritis    Diabetes mellitus without complication ([place])    diet control/no meds per pt   Dyspnea     Essential hypertension 04/11/2019   History of chicken pox    History of hiatal hernia    lung ca dx'd 04/2017   Peripheral vascular disease ([facility])    Pneumonia    PONV (postoperative nausea and vomiting)    Smoker     ALLERGIES:  is allergic to codeine.  MEDICATIONS:  Current Outpatient Medications  Medication Sig Dispense Refill   acetaminophen (TYLENOL) 500 MG tablet Take 500 mg by mouth daily as needed for moderate pain or headache.     amitriptyline (ELAVIL) 50 MG tablet Take 1 tablet (50 mg total) by mouth at bedtime. 90 tablet 3   apixaban (ELIQUIS) 5 MG TABS tablet Take 2 tablets (10mg ) twice daily for 7 days, then 1 tablet (5mg ) twice daily 60 tablet 0   atorvastatin (LIPITOR) 40 MG tablet 1 tablet  po daily 90 tablet 3   Calcium Citrate-Vitamin D (CITRACAL + D PO) Take 1 tablet by mouth daily.     cholecalciferol (VITAMIN D) 25 MCG (1000 UNIT) tablet Take 1,000 Units by mouth daily.     dexamethasone (DECADRON) 2 MG tablet Take 1 tablet (2 mg total) by mouth 2 (two) times daily with a meal. 60 tablet 0   HYDROcodone-acetaminophen (NORCO/VICODIN) 5-325 MG tablet Take 1 tablet by mouth every 4 (four) hours as needed for moderate pain. 20 tablet 0   levETIRAcetam (KEPPRA) 500 MG tablet Take 1 tablet (500 mg total) by mouth 2 (two) times daily. 60 tablet 0   lisinopril (ZESTRIL) 2.5 MG tablet TAKE 1 TABLET(2.5 MG) BY MOUTH DAILY 90 tablet 3   LORazepam (ATIVAN)  0.5 MG tablet 1 tab po 30 minutes prior to radiation or MRI scans 30 tablet 0   Multiple Vitamin (MULTIVITAMIN) capsule Take 1 capsule by mouth daily. Centrum Silver     vitamin B-12 (CYANOCOBALAMIN) 1000 MCG tablet Take 1,000 mcg by mouth daily.     No current facility-administered medications for this visit.    SURGICAL HISTORY:  Past Surgical History:  Procedure Laterality Date   ANKLE FRACTURE SURGERY Left 10 years ago   "hard to wake up from anesthesia" per pt   APPLICATION OF CRANIAL NAVIGATION Right 11/07/2020    Procedure: APPLICATION OF CRANIAL NAVIGATION;  Surgeon: Earnie Larsson, MD;  Location: [place];  Service: Neurosurgery;  Laterality: Right;   [place]   BREAST BIOPSY Right 1978   BENIGN CYST   BREAST EXCISIONAL BIOPSY Left 2011   Benign   BREAST EXCISIONAL BIOPSY Right 1985   Benign    CATARACT EXTRACTION     CATARACT EXTRACTION W/ INTRAOCULAR LENS IMPLANT Bilateral    COLONOSCOPY  2011   CRANIOTOMY Right 11/07/2020   Procedure: Right Sided Craniotomy for Tumor;  Surgeon: Earnie Larsson, MD;  Location: [place];  Service: Neurosurgery;  Laterality: Right;   ELECTROMAGNETIC NAVIGATION BROCHOSCOPY N/A 06/08/2017   Procedure: ELECTROMAGNETIC NAVIGATION BRONCHOSCOPY;  Surgeon: Flora Lipps, MD;  Location: [facility];  Service: Cardiopulmonary;  Laterality: N/A;   FRACTURE SURGERY     POLYPECTOMY  2011   TRANSURETHRAL RESECTION OF BLADDER TUMOR WITH MITOMYCIN-C N/A 05/04/2017   Procedure: TRANSURETHRAL RESECTION OF BLADDER TUMOR WITH gemcitabine;  Surgeon: Abbie Sons, MD;  Location: [facility];  Service: Urology;  Laterality: N/A;   TUBAL LIGATION      REVIEW OF SYSTEMS:  A comprehensive review of systems was negative except for: Constitutional: positive for fatigue   PHYSICAL EXAMINATION: General appearance: alert, cooperative, fatigued, and no distress Head: Normocephalic, without obvious abnormality, atraumatic Neck: no adenopathy, no JVD, supple, symmetrical, trachea midline, and thyroid not enlarged, symmetric, no tenderness/mass/nodules Lymph nodes: Cervical, supraclavicular, and axillary nodes normal. Resp: clear to auscultation bilaterally Back: symmetric, no curvature. ROM normal. No CVA tenderness. Cardio: regular rate and rhythm, S1, S2 normal, no murmur, click, rub or gallop GI: soft, non-tender; bowel sounds normal; no masses,  no organomegaly Extremities: extremities normal, atraumatic, no cyanosis or edema  ECOG PERFORMANCE STATUS: 1 - Symptomatic but  completely ambulatory  Blood pressure 117/64, pulse (!) 109, temperature 98.2 F (36.8 C), temperature source Tympanic, resp. rate 19, height 4\' 11"  (1.499 m), weight 138 lb 8 oz (62.8 kg), SpO2 97 %.  LABORATORY DATA: Lab Results  Component Value Date   WBC 6.5 12/24/2020   HGB 10.6 (L) 12/24/2020   HCT 31.6 (L) 12/24/2020   MCV 95.2 12/24/2020   PLT 327 12/24/2020      Chemistry      Component Value Date/Time   NA 142 12/24/2020 1425   K 4.4 12/24/2020 1425   CL 107 12/24/2020 1425   CO2 24 12/24/2020 1425   BUN 11 12/24/2020 1425   CREATININE 0.73 12/24/2020 1425      Component Value Date/Time   CALCIUM 10.0 12/24/2020 1425   ALKPHOS 94 12/24/2020 1425   AST 18 12/24/2020 1425   ALT 19 12/24/2020 1425   BILITOT 0.4 12/24/2020 1425       RADIOGRAPHIC STUDIES: MR Brain W Wo Contrast  Result Date: 11/28/2020 CLINICAL DATA:  Secondary malignant neoplasm of brain and spinal cord [facility]); benign neoplasm, brain/CNS; 3T  SRS Protocol for post operative treatment planning. EXAM: MRI HEAD WITHOUT AND WITH CONTRAST TECHNIQUE: Multiplanar, multiecho pulse sequences of the brain and surrounding structures were obtained without and with intravenous contrast. CONTRAST:  57mL MULTIHANCE GADOBENATE DIMEGLUMINE 529 MG/ML IV SOLN COMPARISON:  MRI of the brain November 08, 2020. FINDINGS: Brain: Postsurgical changes from right frontoparietal craniotomy for resection of a right frontal tumor. The surgical cavity measures approximately 2.4 x 2.0 x 1.7 cm with significantly decreased surrounding vasogenic edema. The surgical cavity now shows T1 hyperintense content suggestive of blood products with predominantly seen peripheral contrast enhancement with the exception of the posterior aspect of the surgical cavity which has a more nodular appearance (series 13, images 120-128). No new focus of abnormal contrast enhancement to suggest new metastatic lesion. No evidence of an acute infarct, hemorrhage,  hydrocephalus or midline shift. Vascular: Normal flow voids. Skull and upper cervical spine: Normal marrow signal. Sinuses/Orbits: Bilateral lens surgery. Paranasal sinuses are clear. Other: None. IMPRESSION: 1. Significant interval improvement of the vasogenic edema surrounding the surgical cavity in the right frontal lobe. The surgical cavity now filled by blood products with predominantly thin peripheral contrast enhancement except for a focal nodular enhancement in the posterior aspect of the surgical cavity. Residual/recurrent tumor not excluded. Attention on follow-up suggested. 2. No new focus of abnormal contrast enhancement to suggest new metastatic lesion. Electronically Signed   By: Pedro Earls M.D.   On: 11/28/2020 15:37   NM PET Image Restage (PS) Skull Base to Thigh  Result Date: 12/25/2020 CLINICAL DATA:  Subsequent treatment strategy for non-small cell lung cancer. EXAM: NUCLEAR MEDICINE PET SKULL BASE TO THIGH TECHNIQUE: 7.05 mCi F-18 FDG was injected intravenously. Full-ring PET imaging was performed from the skull base to thigh after the radiotracer. CT data was obtained and used for attenuation correction and anatomic localization. Fasting blood glucose: 97 mg/dl COMPARISON:  Multiple prior CT examinations and prior PET-CT from 05/26/2017 FINDINGS: Mediastinal blood pool activity: SUV max 2.04 Liver activity: SUV max NA NECK: No hypermetabolic lymph nodes in the neck. Incidental CT findings: Stable bilateral low-attenuation thyroid nodules. No hypermetabolism. CHEST: No mediastinal or hilar lymphadenopathy. Stable radiation changes involving the right lower lobe and right paramediastinal lung. No findings suspicious for recurrent tumor. Stable 6.5 mm right lower lobe pulmonary nodule. No hypermetabolism. A few other smaller scattered sub solid nodules are unchanged. 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 Incidental CT findings: Stable advanced atherosclerotic calcifications  involving the aorta and coronary arteries. Stable small hiatal hernia. ABDOMEN/PELVIS: No abnormal hypermetabolic activity within the liver, pancreas, adrenal glands, or spleen. No hypermetabolic lymph nodes in the abdomen or pelvis. Incidental CT findings: Stable advanced vascular calcifications but no aneurysm. Stable sigmoid colon diverticulosis. SKELETON: No findings for osseous metastatic disease. Incidental CT findings: none IMPRESSION: 1. No PET-CT findings to suggest recurrent tumor or metastatic disease. 2. Stable small pulmonary nodules. Recommend continued surveillance. 3. Stable advanced vascular disease. Electronically Signed   By: Marijo Sanes M.D.   On: 12/25/2020 13:09   VAS [name] LOWER EXTREMITY VENOUS (DVT)  Result Date: 12/13/2020  Lower Venous DVT Study Patient Name:  Katelyn Kennedy  Date of Exam:   12/13/2020 Medical Rec #: [id number]        Accession #:    [id number] Date of Birth: 13-Oct-1945       Patient Gender: F Patient Age:   75 years Exam Location:  [facility] Procedure:  VAS [name] LOWER EXTREMITY VENOUS (DVT) Referring Phys: Mallie Mussel POOL --------------------------------------------------------------------------------  Indications: Pain, Swelling, and Edema. Other Indications: One month of bilateral lower extremity edema at night. Risk Factors: Cancer Brain and lung. Performing Technologist: Delorise Shiner RVT  Examination Guidelines: A complete evaluation includes B-mode imaging, spectral Doppler, color Doppler, and power Doppler as needed of all accessible portions of each vessel. Bilateral testing is considered an integral part of a complete examination. Limited examinations for reoccurring indications may be performed as noted. The reflux portion of the exam is performed with the patient in reverse Trendelenburg.  +---------+---------------+---------+-----------+---------------+--------------+ RIGHT    CompressibilityPhasicitySpontaneityProperties     Thrombus  Aging +---------+---------------+---------+-----------+---------------+--------------+ CFV      Full           Yes      Yes                                      +---------+---------------+---------+-----------+---------------+--------------+ SFJ      Full           Yes      Yes                                      +---------+---------------+---------+-----------+---------------+--------------+ FV Prox  Full           Yes                                               +---------+---------------+---------+-----------+---------------+--------------+ FV Mid   Full           Yes                                               +---------+---------------+---------+-----------+---------------+--------------+ FV DistalNone           No       No         softly         Acute                                                      echogenic                     +---------+---------------+---------+-----------+---------------+--------------+ PFV      Full           Yes                                               +---------+---------------+---------+-----------+---------------+--------------+ POP      None           No       No         softly         Acute  echogenic                     +---------+---------------+---------+-----------+---------------+--------------+ PTV      None           No       No         softly         Acute                                                      echogenic                     +---------+---------------+---------+-----------+---------------+--------------+ GSV      Full                    Yes                                      +---------+---------------+---------+-----------+---------------+--------------+ SSV      Full                    Yes                                       +---------+---------------+---------+-----------+---------------+--------------+ Thrombus seen extending from distal femoral vein through to the distal posterior tibial veins.  +---------+---------------+---------+-----------+----------+--------------+ LEFT     CompressibilityPhasicitySpontaneityPropertiesThrombus Aging +---------+---------------+---------+-----------+----------+--------------+ CFV      Full           Yes      Yes                                 +---------+---------------+---------+-----------+----------+--------------+ SFJ      Full           Yes      Yes                                 +---------+---------------+---------+-----------+----------+--------------+ FV Prox  Full           Yes      Yes                                 +---------+---------------+---------+-----------+----------+--------------+ FV Mid   Full           Yes      Yes                                 +---------+---------------+---------+-----------+----------+--------------+ FV DistalFull           Yes      Yes                                 +---------+---------------+---------+-----------+----------+--------------+ PFV      Full           Yes      Yes                                 +---------+---------------+---------+-----------+----------+--------------+  POP      Full           Yes      Yes                                 +---------+---------------+---------+-----------+----------+--------------+ PTV      Full                    Yes                                 +---------+---------------+---------+-----------+----------+--------------+ Gastroc  None                               dilated   Acute          +---------+---------------+---------+-----------+----------+--------------+ GSV      Full                    Yes                                 +---------+---------------+---------+-----------+----------+--------------+ SSV      Full                                                         +---------+---------------+---------+-----------+----------+--------------+    Findings reported to Carly at 15:09.  Summary: RIGHT: - Findings consistent with acute deep vein thrombosis involving the right femoral vein, right popliteal vein, and right posterior tibial veins. - No cystic structure found in the popliteal fossa.  LEFT: - Findings consistent with acute deep vein thrombosis involving the left gastrocnemius veins. - No cystic structure found in the popliteal fossa.  *See table(s) above for measurements and observations. Electronically signed by Deitra Mayo MD on 12/13/2020 at 4:23:42 PM.    Final      ASSESSMENT AND PLAN: This is a very pleasant 75 year old white female with now metastatic non-small cell lung cancer, adenocarcinoma initially diagnosed as stage IIIA non-small cell lung cancer, adenocarcinoma presented mainly with right lower lobe lung mass in addition to subcarinal lymphadenopathy but there was suspicious metastatic pulmonary nodules.  She has metastatic disease to the brain in July 2022. The patient completed a course of concurrent chemoradiation with weekly carboplatin and paclitaxel.  She tolerated her treatment well with no concerning complaints. The patient recently completed consolidation treatment with immunotherapy with Imfinzi (Durvalumab). She is status post 26 cycles. She tolerated it well without any adverse effects.  The patient underwent craniotomy and surgical resection of the solitary brain metastasis under the care of Dr. Trenton Gammon and the final pathology was consistent with metastatic adenocarcinoma of the lung. The patient is currently on observation and she is feeling fine. She had SRS to the resection cavity of the brain tumor. The patient had a PET scan performed recently and she is here for evaluation and discussion of her scan and treatment options. Her scan showed no concerning findings for disease  recurrence or metastasis. I recommended for her to continue on observation with repeat CT scan of the chest in 4 months. The patient was  advised to call immediately if she has any other concerning symptoms in the interval.  All questions were answered. The patient knows to call the clinic with any problems, questions or concerns. We can certainly see the patient much sooner if necessary.  Disclaimer: This note was dictated with voice recognition software. Similar sounding words can inadvertently be transcribed and may not be corrected upon review.

## 2020-12-26 NOTE — Telephone Encounter (Signed)
Scheduled appt per 9/15 los - mailed letter with appt date and time   

## 2021-01-02 ENCOUNTER — Telehealth: Payer: Self-pay

## 2021-01-02 NOTE — Chronic Care Management (AMB) (Addendum)
Chronic Care Management Pharmacy Assistant   Name: Katelyn Kennedy  MRN: [id number] DOB: 10/07/1945   Reason for Encounter: General Adherence   Recent office visits:  11/22/20-PCP-Patient presented for follow up after hospital visit. Admitted for uncomplicated craniotomy and resection of right frontal tumor... pathology showing metastatic carcinoma. Now on low dose steroid. Now on seizure medication(Keppra). 10/29/20-PCP-Patient presented for follow up Left side weakness. MRI ordered, start aspirin 81 mg take 1 tablet daily. 10/15/20-PCP-Patient presented for AWV. Patient quit smoking.Try increase of amitriptyline to 50mg  at bedtime.Increase Atorvastatin to 40mg  take 1 tablet daily.   Recent consult visits: 12/26/20-Oncology-Patient presented for metastatic non-small cell lung cancer.Scans reviewed, no medication changes follow up 4 months 12/13/20- Neurology- Patient presented for doppler study of bilat. Lower extremities. Positive for DVT-started Eliquis 5mg . Take 2 tablets twice daily for 7 days, then 1 tablet twice daily  11/12/20-Oncology-Patient presented for follow up of metastatic non small cell lung cancer,continue with observation   Hospital visits:  11/07/20 thru 11/08/20- [facility]- surgical resection of brain tumor. Follow up 1 week with Oncology for radiation.Discharged with Dexamethasone 2mg  take 1 tablet 2 times daily, Atorvastatin 40mg  take 1 tablet daily, DME for ambulation.  Medications: Outpatient Encounter Medications as of 01/02/2021  Medication Sig   acetaminophen (TYLENOL) 500 MG tablet Take 500 mg by mouth daily as needed for moderate pain or headache.   amitriptyline (ELAVIL) 50 MG tablet Take 1 tablet (50 mg total) by mouth at bedtime.   apixaban (ELIQUIS) 5 MG TABS tablet Take 2 tablets (10mg ) twice daily for 7 days, then 1 tablet (5mg ) twice daily   atorvastatin (LIPITOR) 40 MG tablet 1 tablet  po daily   Calcium Citrate-Vitamin D (CITRACAL + D PO) Take 1  tablet by mouth daily.   cholecalciferol (VITAMIN D) 25 MCG (1000 UNIT) tablet Take 1,000 Units by mouth daily.   dexamethasone (DECADRON) 2 MG tablet Take 1 tablet (2 mg total) by mouth 2 (two) times daily with a meal.   HYDROcodone-acetaminophen (NORCO/VICODIN) 5-325 MG tablet Take 1 tablet by mouth every 4 (four) hours as needed for moderate pain.   levETIRAcetam (KEPPRA) 500 MG tablet Take 1 tablet (500 mg total) by mouth 2 (two) times daily.   lisinopril (ZESTRIL) 2.5 MG tablet TAKE 1 TABLET(2.5 MG) BY MOUTH DAILY   LORazepam (ATIVAN) 0.5 MG tablet 1 tab po 30 minutes prior to radiation or MRI scans   Multiple Vitamin (MULTIVITAMIN) capsule Take 1 capsule by mouth daily. Centrum Silver   vitamin B-12 (CYANOCOBALAMIN) 1000 MCG tablet Take 1,000 mcg by mouth daily.   No facility-administered encounter medications on file as of 01/02/2021.     Contacted [name] on 01/02/21 for general disease state and medication adherence call.   Patient is not > 5 days past due for refill on the following medications per chart history:  [facility] Medications: Medication Name/mg Last Fill Days Supply Atorvastatin 40mg    10/15/20  90 Lisinopril 2.5mg   01/01/21 90  What concerns do you have about your medications? The patient wanted to inform us of new medications since first of September which are Eliquis  and Keppra.  The patient denies side effects with her medications.   How often do you forget or accidentally miss a dose? Never  Do you use a pillbox? Yes  Are you having any problems getting your medications from your pharmacy? No  Has the cost of your medications been a concern? No  Since last visit with  CPP, the following interventions have been made: The patient has surgery for metastatic cancer.  The patient has not had an ED visit since last contact.   The patient denies problems with their health. The patient reports she is stronger from having physical therapy and has not fallen  lately.  she denies  concerns or questions for [name], Pharm. D at this time.   Counseled patient on:  Importance of taking medication daily without missed doses, Benefits of adherence packaging or a pillbox, and Access to CCM team for any cost, medication or pharmacy concerns.   Care Gaps: Annual wellness visit in last year? Yes  10/15/20 Most Recent BP reading:117/64  109-P  12/26/20  If Diabetic: Most recent A1C reading:  5.9  10/11/20 Last eye exam / retinopathy screening: 08/24/20 Last diabetic foot exam: 11/01/20  Patient has appointment with Oncology 03/17/21  [name], CPP notified  Avel Sensor, [place] Assistant [phone]  I have reviewed the care management and care coordination activities outlined in this encounter and I am certifying that I agree with the content of this note. No further action required.  [name], PharmD Clinical Pharmacist [facility] [phone]

## 2021-01-04 ENCOUNTER — Other Ambulatory Visit: Payer: Self-pay | Admitting: Physician Assistant

## 2021-01-04 DIAGNOSIS — I824Y3 Acute embolism and thrombosis of unspecified deep veins of proximal lower extremity, bilateral: Secondary | ICD-10-CM

## 2021-01-06 ENCOUNTER — Other Ambulatory Visit: Payer: Self-pay | Admitting: Physician Assistant

## 2021-01-06 ENCOUNTER — Ambulatory Visit
Admission: RE | Admit: 2021-01-06 | Discharge: 2021-01-06 | Disposition: A | Discharge status: Home / Self Care | Payer: PPO | Source: Ambulatory Visit | Attending: Internal Medicine | Admitting: Internal Medicine

## 2021-01-06 DIAGNOSIS — C3431 Malignant neoplasm of lower lobe, right bronchus or lung: Secondary | ICD-10-CM

## 2021-01-06 DIAGNOSIS — I824Y9 Acute embolism and thrombosis of unspecified deep veins of unspecified proximal lower extremity: Secondary | ICD-10-CM

## 2021-01-06 MED ORDER — APIXABAN 5 MG PO TABS: 5.0000 mg | ORAL_TABLET | Freq: Two times a day (BID) | ORAL | 2 refills | Status: DC

## 2021-01-06 NOTE — Progress Notes (Signed)
  Radiation Oncology         [phone]) [phone] ________________________________  Name: Katelyn Kennedy MRN: [id number]  Date of Service: 01/06/2021  DOB: 03-24-1946  Post Treatment Telephone Note  Diagnosis:   Progressive metastatic, adenocarcinoma of the RLL with solitary brain metastasis.  Interval Since Last Radiation:  5 weeks   11/29/2020 through 12/04/2020 Postoperative SRS Site Technique Total Dose (Gy) Dose per Fx (Gy) Completed Fx Beam Energies  Brain: Brain IMRT 27/27 9 3/3 6XFFF    Narrative:  The patient was contacted today for routine follow-up. During treatment she did very well with radiotherapy and did not have significant desquamation.    Impression/Plan: 1. 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 I was unable to reach the patient but left a voicemail and on the message, I  discussed that we would be happy to continue to follow her as needed, but she will also continue to follow up with Dr. Julien Nordmann as well as with Dr. Mickeal Skinner in surveillance.      Carola Rhine, PAC

## 2021-03-05 ENCOUNTER — Inpatient Hospital Stay (HOSPITAL_COMMUNITY)
Admission: EM | Admit: 2021-03-05 | Discharge: 2021-03-19 | DRG: 080 | Disposition: A | Discharge status: Home Health | Payer: PPO | Attending: Internal Medicine | Admitting: Internal Medicine

## 2021-03-05 ENCOUNTER — Emergency Department (HOSPITAL_COMMUNITY): Payer: PPO

## 2021-03-05 ENCOUNTER — Other Ambulatory Visit: Payer: Self-pay

## 2021-03-05 ENCOUNTER — Encounter (HOSPITAL_COMMUNITY): Payer: Self-pay

## 2021-03-05 DIAGNOSIS — Z961 Presence of intraocular lens: Secondary | ICD-10-CM | POA: Diagnosis present

## 2021-03-05 DIAGNOSIS — G9341 Metabolic encephalopathy: Secondary | ICD-10-CM | POA: Diagnosis not present

## 2021-03-05 DIAGNOSIS — Z8551 Personal history of malignant neoplasm of bladder: Secondary | ICD-10-CM

## 2021-03-05 DIAGNOSIS — C3491 Malignant neoplasm of unspecified part of right bronchus or lung: Secondary | ICD-10-CM

## 2021-03-05 DIAGNOSIS — E1151 Type 2 diabetes mellitus with diabetic peripheral angiopathy without gangrene: Secondary | ICD-10-CM | POA: Diagnosis present

## 2021-03-05 DIAGNOSIS — C7931 Secondary malignant neoplasm of brain: Secondary | ICD-10-CM

## 2021-03-05 DIAGNOSIS — I82409 Acute embolism and thrombosis of unspecified deep veins of unspecified lower extremity: Secondary | ICD-10-CM | POA: Diagnosis not present

## 2021-03-05 DIAGNOSIS — L8990 Pressure ulcer of unspecified site, unspecified stage: Secondary | ICD-10-CM | POA: Diagnosis present

## 2021-03-05 DIAGNOSIS — Z9221 Personal history of antineoplastic chemotherapy: Secondary | ICD-10-CM

## 2021-03-05 DIAGNOSIS — E119 Type 2 diabetes mellitus without complications: Secondary | ICD-10-CM

## 2021-03-05 DIAGNOSIS — G8194 Hemiplegia, unspecified affecting left nondominant side: Secondary | ICD-10-CM | POA: Diagnosis present

## 2021-03-05 DIAGNOSIS — Z9842 Cataract extraction status, left eye: Secondary | ICD-10-CM

## 2021-03-05 DIAGNOSIS — Z781 Physical restraint status: Secondary | ICD-10-CM

## 2021-03-05 DIAGNOSIS — R442 Other hallucinations: Secondary | ICD-10-CM | POA: Diagnosis not present

## 2021-03-05 DIAGNOSIS — Z515 Encounter for palliative care: Secondary | ICD-10-CM | POA: Diagnosis not present

## 2021-03-05 DIAGNOSIS — J309 Allergic rhinitis, unspecified: Secondary | ICD-10-CM | POA: Diagnosis present

## 2021-03-05 DIAGNOSIS — Z833 Family history of diabetes mellitus: Secondary | ICD-10-CM

## 2021-03-05 DIAGNOSIS — R29898 Other symptoms and signs involving the musculoskeletal system: Secondary | ICD-10-CM | POA: Diagnosis not present

## 2021-03-05 DIAGNOSIS — Z7901 Long term (current) use of anticoagulants: Secondary | ICD-10-CM | POA: Diagnosis not present

## 2021-03-05 DIAGNOSIS — R531 Weakness: Secondary | ICD-10-CM | POA: Diagnosis not present

## 2021-03-05 DIAGNOSIS — C3490 Malignant neoplasm of unspecified part of unspecified bronchus or lung: Secondary | ICD-10-CM

## 2021-03-05 DIAGNOSIS — C3431 Malignant neoplasm of lower lobe, right bronchus or lung: Secondary | ICD-10-CM

## 2021-03-05 DIAGNOSIS — M1990 Unspecified osteoarthritis, unspecified site: Secondary | ICD-10-CM | POA: Diagnosis not present

## 2021-03-05 DIAGNOSIS — Z9841 Cataract extraction status, right eye: Secondary | ICD-10-CM

## 2021-03-05 DIAGNOSIS — Z66 Do not resuscitate: Secondary | ICD-10-CM | POA: Diagnosis present

## 2021-03-05 DIAGNOSIS — R9089 Other abnormal findings on diagnostic imaging of central nervous system: Secondary | ICD-10-CM | POA: Diagnosis not present

## 2021-03-05 DIAGNOSIS — I160 Hypertensive urgency: Secondary | ICD-10-CM | POA: Diagnosis not present

## 2021-03-05 DIAGNOSIS — Z923 Personal history of irradiation: Secondary | ICD-10-CM | POA: Diagnosis not present

## 2021-03-05 DIAGNOSIS — E1169 Type 2 diabetes mellitus with other specified complication: Secondary | ICD-10-CM | POA: Diagnosis present

## 2021-03-05 DIAGNOSIS — I611 Nontraumatic intracerebral hemorrhage in hemisphere, cortical: Secondary | ICD-10-CM | POA: Diagnosis not present

## 2021-03-05 DIAGNOSIS — I619 Nontraumatic intracerebral hemorrhage, unspecified: Secondary | ICD-10-CM

## 2021-03-05 DIAGNOSIS — Z20822 Contact with and (suspected) exposure to covid-19: Secondary | ICD-10-CM | POA: Diagnosis present

## 2021-03-05 DIAGNOSIS — G936 Cerebral edema: Principal | ICD-10-CM | POA: Diagnosis present

## 2021-03-05 DIAGNOSIS — Z87891 Personal history of nicotine dependence: Secondary | ICD-10-CM

## 2021-03-05 DIAGNOSIS — R2689 Other abnormalities of gait and mobility: Secondary | ICD-10-CM | POA: Diagnosis present

## 2021-03-05 DIAGNOSIS — Z7189 Other specified counseling: Secondary | ICD-10-CM | POA: Diagnosis not present

## 2021-03-05 DIAGNOSIS — M810 Age-related osteoporosis without current pathological fracture: Secondary | ICD-10-CM | POA: Diagnosis not present

## 2021-03-05 DIAGNOSIS — R443 Hallucinations, unspecified: Secondary | ICD-10-CM | POA: Diagnosis not present

## 2021-03-05 DIAGNOSIS — E1141 Type 2 diabetes mellitus with diabetic mononeuropathy: Secondary | ICD-10-CM | POA: Diagnosis not present

## 2021-03-05 DIAGNOSIS — Z885 Allergy status to narcotic agent status: Secondary | ICD-10-CM

## 2021-03-05 DIAGNOSIS — R000 Tachycardia, unspecified: Secondary | ICD-10-CM | POA: Diagnosis not present

## 2021-03-05 DIAGNOSIS — R4182 Altered mental status, unspecified: Secondary | ICD-10-CM | POA: Diagnosis not present

## 2021-03-05 DIAGNOSIS — R441 Visual hallucinations: Secondary | ICD-10-CM | POA: Diagnosis present

## 2021-03-05 DIAGNOSIS — I10 Essential (primary) hypertension: Secondary | ICD-10-CM | POA: Diagnosis present

## 2021-03-05 DIAGNOSIS — L89151 Pressure ulcer of sacral region, stage 1: Secondary | ICD-10-CM | POA: Diagnosis present

## 2021-03-05 DIAGNOSIS — Z79899 Other long term (current) drug therapy: Secondary | ICD-10-CM

## 2021-03-05 DIAGNOSIS — Z86718 Personal history of other venous thrombosis and embolism: Secondary | ICD-10-CM

## 2021-03-05 DIAGNOSIS — T380X5A Adverse effect of glucocorticoids and synthetic analogues, initial encounter: Secondary | ICD-10-CM | POA: Diagnosis not present

## 2021-03-05 DIAGNOSIS — E785 Hyperlipidemia, unspecified: Secondary | ICD-10-CM | POA: Diagnosis present

## 2021-03-05 DIAGNOSIS — Z9851 Tubal ligation status: Secondary | ICD-10-CM

## 2021-03-05 DIAGNOSIS — Z8719 Personal history of other diseases of the digestive system: Secondary | ICD-10-CM

## 2021-03-05 DIAGNOSIS — G9340 Encephalopathy, unspecified: Secondary | ICD-10-CM | POA: Diagnosis present

## 2021-03-05 DIAGNOSIS — E1165 Type 2 diabetes mellitus with hyperglycemia: Secondary | ICD-10-CM | POA: Diagnosis not present

## 2021-03-05 LAB — HEPATIC FUNCTION PANEL
ALT: 17 U/L (ref 0–44)
AST: 25 U/L (ref 15–41)
Albumin: 3.3 g/dL — ABNORMAL LOW (ref 3.5–5.0)
Alkaline Phosphatase: 117 U/L (ref 38–126)
Bilirubin, Direct: 0.1 mg/dL (ref 0.0–0.2)
Indirect Bilirubin: 0.6 mg/dL (ref 0.3–0.9)
Total Bilirubin: 0.7 mg/dL (ref 0.3–1.2)
Total Protein: 5.8 g/dL — ABNORMAL LOW (ref 6.5–8.1)

## 2021-03-05 LAB — CBC WITH DIFFERENTIAL/PLATELET
Abs Immature Granulocytes: 0.02 10*3/uL (ref 0.00–0.07)
Basophils Absolute: 0 10*3/uL (ref 0.0–0.1)
Basophils Relative: 0 %
Eosinophils Absolute: 0.1 10*3/uL (ref 0.0–0.5)
Eosinophils Relative: 1 %
HCT: 34 % — ABNORMAL LOW (ref 36.0–46.0)
Hemoglobin: 11.2 g/dL — ABNORMAL LOW (ref 12.0–15.0)
Immature Granulocytes: 0 %
Lymphocytes Relative: 28 %
Lymphs Abs: 1.6 10*3/uL (ref 0.7–4.0)
MCH: 30.9 pg (ref 26.0–34.0)
MCHC: 32.9 g/dL (ref 30.0–36.0)
MCV: 93.9 fL (ref 80.0–100.0)
Monocytes Absolute: 0.6 10*3/uL (ref 0.1–1.0)
Monocytes Relative: 11 %
Neutro Abs: 3.4 10*3/uL (ref 1.7–7.7)
Neutrophils Relative %: 60 %
Platelets: 256 10*3/uL (ref 150–400)
RBC: 3.62 MIL/uL — ABNORMAL LOW (ref 3.87–5.11)
RDW: 12.4 % (ref 11.5–15.5)
WBC: 5.7 10*3/uL (ref 4.0–10.5)
nRBC: 0 % (ref 0.0–0.2)

## 2021-03-05 LAB — PROTIME-INR
INR: 1.1 (ref 0.8–1.2)
Prothrombin Time: 14.4 seconds (ref 11.4–15.2)

## 2021-03-05 LAB — BASIC METABOLIC PANEL
Anion gap: 8 (ref 5–15)
BUN: 5 mg/dL — ABNORMAL LOW (ref 8–23)
CO2: 26 mmol/L (ref 22–32)
Calcium: 9.4 mg/dL (ref 8.9–10.3)
Chloride: 108 mmol/L (ref 98–111)
Creatinine, Ser: 0.7 mg/dL (ref 0.44–1.00)
GFR, Estimated: >60 mL/min (ref >60)
Glucose, Bld: 119 mg/dL — ABNORMAL HIGH (ref 70–99)
Potassium: 3.6 mmol/L (ref 3.5–5.1)
Sodium: 142 mmol/L (ref 135–145)

## 2021-03-05 LAB — HEMOGLOBIN A1C
Hgb A1c MFr Bld: 4.9 % (ref 4.8–5.6)
Mean Plasma Glucose: 93.93 mg/dL

## 2021-03-05 LAB — MAGNESIUM: Magnesium: 1.7 mg/dL (ref 1.7–2.4)

## 2021-03-05 LAB — RESP PANEL BY RT-PCR (FLU A&B, COVID) ARPGX2
Influenza A by PCR: NEGATIVE
Influenza B by PCR: NEGATIVE
SARS Coronavirus 2 by RT PCR: NEGATIVE

## 2021-03-05 LAB — CBG MONITORING, ED: Glucose-Capillary: 136 mg/dL — ABNORMAL HIGH (ref 70–99)

## 2021-03-05 LAB — HEPARIN LEVEL (UNFRACTIONATED): Heparin Unfractionated: 1.08 IU/mL — ABNORMAL HIGH (ref 0.30–0.70)

## 2021-03-05 MED ORDER — CHLORHEXIDINE GLUCONATE CLOTH 2 % EX PADS
6.0000 | MEDICATED_PAD | Freq: Every day | CUTANEOUS | Status: DC
  Administered 2021-03-05 – 2021-03-10 (×6): 6 via TOPICAL

## 2021-03-05 MED ORDER — INSULIN ASPART 100 UNIT/ML IJ SOLN
0.0000 [IU] | INTRAMUSCULAR | Status: DC
  Administered 2021-03-05: 2 [IU] via SUBCUTANEOUS
  Administered 2021-03-06: 3 [IU] via SUBCUTANEOUS
  Administered 2021-03-06: 2 [IU] via SUBCUTANEOUS
  Administered 2021-03-06 (×2): 3 [IU] via SUBCUTANEOUS
  Administered 2021-03-06 (×2): 5 [IU] via SUBCUTANEOUS
  Administered 2021-03-07: 2 [IU] via SUBCUTANEOUS
  Administered 2021-03-07 (×3): 3 [IU] via SUBCUTANEOUS
  Administered 2021-03-07: 2 [IU] via SUBCUTANEOUS
  Administered 2021-03-07: 3 [IU] via SUBCUTANEOUS
  Administered 2021-03-08: 2 [IU] via SUBCUTANEOUS
  Administered 2021-03-08: 3 [IU] via SUBCUTANEOUS
  Administered 2021-03-08: 2 [IU] via SUBCUTANEOUS
  Administered 2021-03-08: 5 [IU] via SUBCUTANEOUS
  Administered 2021-03-08: 2 [IU] via SUBCUTANEOUS
  Administered 2021-03-09: 18:00:00 5 [IU] via SUBCUTANEOUS
  Administered 2021-03-09 (×4): 3 [IU] via SUBCUTANEOUS

## 2021-03-05 MED ORDER — MAGNESIUM SULFATE 2 GM/50ML IV SOLN
2.0000 g | Freq: Once | INTRAVENOUS | Status: AC
  Administered 2021-03-05: 2 g via INTRAVENOUS
  Filled 2021-03-05: qty 50

## 2021-03-05 MED ORDER — POTASSIUM CHLORIDE IN NACL 20-0.9 MEQ/L-% IV SOLN
INTRAVENOUS | Status: DC
  Filled 2021-03-05 (×9): qty 1000

## 2021-03-05 MED ORDER — DOCUSATE SODIUM 100 MG PO CAPS: 100.0000 mg | ORAL_CAPSULE | Freq: Two times a day (BID) | ORAL | Status: DC | PRN

## 2021-03-05 MED ORDER — DEXAMETHASONE SODIUM PHOSPHATE 10 MG/ML IJ SOLN
10.0000 mg | Freq: Once | INTRAMUSCULAR | Status: AC
  Administered 2021-03-05: 10 mg via INTRAVENOUS
  Filled 2021-03-05: qty 1

## 2021-03-05 MED ORDER — POLYETHYLENE GLYCOL 3350 17 G PO PACK: 17.0000 g | PACK | Freq: Every day | ORAL | Status: DC | PRN

## 2021-03-05 MED ORDER — FAMOTIDINE IN NACL 20-0.9 MG/50ML-% IV SOLN
20.0000 mg | Freq: Every day | INTRAVENOUS | Status: DC
  Filled 2021-03-05: qty 50

## 2021-03-05 MED ORDER — LEVETIRACETAM 500 MG PO TABS
500.0000 mg | ORAL_TABLET | Freq: Two times a day (BID) | ORAL | Status: DC
  Administered 2021-03-05 – 2021-03-15 (×20): 500 mg via ORAL
  Filled 2021-03-05 (×20): qty 1

## 2021-03-05 MED ORDER — MULTIVITAMINS PO CAPS: 1.0000 | ORAL_CAPSULE | Freq: Every day | ORAL | Status: DC

## 2021-03-05 MED ORDER — DEXAMETHASONE SODIUM PHOSPHATE 10 MG/ML IJ SOLN: 8.0000 mg | Freq: Two times a day (BID) | INTRAMUSCULAR | Status: DC

## 2021-03-05 MED ORDER — ADULT MULTIVITAMIN W/MINERALS CH
1.0000 | ORAL_TABLET | Freq: Every day | ORAL | Status: DC
  Administered 2021-03-06 – 2021-03-19 (×13): 1 via ORAL
  Filled 2021-03-05 (×15): qty 1

## 2021-03-05 MED ORDER — ONDANSETRON HCL 4 MG/2ML IJ SOLN: 4.0000 mg | Freq: Four times a day (QID) | INTRAMUSCULAR | Status: DC | PRN

## 2021-03-05 MED ORDER — PROTHROMBIN COMPLEX CONC HUMAN 500 UNITS IV KIT
3056.0000 [IU] | PACK | Status: AC
  Administered 2021-03-05: 3056 [IU] via INTRAVENOUS
  Filled 2021-03-05: qty 3056

## 2021-03-05 MED ORDER — VITAMIN B-12 1000 MCG PO TABS
1000.0000 ug | ORAL_TABLET | Freq: Every day | ORAL | Status: DC
  Administered 2021-03-06 – 2021-03-19 (×13): 1000 ug via ORAL
  Filled 2021-03-05 (×14): qty 1

## 2021-03-05 MED ORDER — LISINOPRIL 2.5 MG PO TABS
2.5000 mg | ORAL_TABLET | Freq: Every day | ORAL | Status: DC
  Filled 2021-03-05: qty 1

## 2021-03-05 MED ORDER — ATORVASTATIN CALCIUM 40 MG PO TABS
40.0000 mg | ORAL_TABLET | Freq: Every day | ORAL | Status: DC
  Administered 2021-03-05 – 2021-03-18 (×14): 40 mg via ORAL
  Filled 2021-03-05 (×14): qty 1

## 2021-03-05 MED ORDER — AMITRIPTYLINE HCL 25 MG PO TABS
50.0000 mg | ORAL_TABLET | Freq: Every day | ORAL | Status: DC
  Administered 2021-03-05 – 2021-03-18 (×14): 50 mg via ORAL
  Filled 2021-03-05: qty 2
  Filled 2021-03-05: qty 1
  Filled 2021-03-05 (×13): qty 2

## 2021-03-05 MED ORDER — ACETAMINOPHEN 325 MG PO TABS: 650.0000 mg | ORAL_TABLET | ORAL | Status: DC | PRN

## 2021-03-05 NOTE — ED Notes (Signed)
[name], sister in law, [phone].

## 2021-03-05 NOTE — Progress Notes (Signed)
[facility] Progress Note Patient Name: Katelyn Kennedy DOB: [birth year] MRN: [id number]   Date of Service  03/05/2021  HPI/Events of Note  Patient admitted to ICU for neuro monitoring. Seen by PCCM. Intracranial bleeding with lung mets (lung cancer), on eliquis. Reversal done in ER and neurosurgery consulted.   eICU Interventions  IV steroids, keppra, close monitoring and further plan per neurosurgery and bedside CCM orders Call E link if needed Patient comfortable on camera in no distress     Intervention Category Major Interventions: Delirium, psychosis, severe agitation - evaluation and management Evaluation Type: New Patient Evaluation  Margaretmary Lombard 03/05/2021, 10:31 PM

## 2021-03-05 NOTE — ED Provider Notes (Addendum)
[facility] EMERGENCY DEPARTMENT Provider Note   CSN: [id number] Arrival date & time: 03/05/21  1553     History Chief Complaint  Patient presents with   Weakness    Katelyn Kennedy is a 75 y.o. female.   Weakness Associated symptoms: no abdominal pain, no arthralgias, no chest pain, no cough, no dysuria, no fever, no headaches, no seizures, no shortness of breath and no vomiting    75 year old female with a history of metastatic lung cancer status post tumor resection due to brain metastasis 4 months ago with residual left hemibody weakness presenting to the emergency department with gradual worsening left hemibody weakness and report of visual hallucinations and confusion.  The patient states that she thought should her family was present with her but had forgotten that they were not coming to visit her for Thanksgiving.  She states she normally ambulates around the house without assistance but endorses worsening left lower extremity and left arm weakness.   Past Medical History:  Diagnosis Date   Allergic rhinitis    Arthritis    Diabetes mellitus without complication (HCC)    diet control/no meds per pt   Dyspnea    Essential hypertension 04/11/2019   History of chicken pox    History of hiatal hernia    lung ca dx'd 04/2017   Peripheral vascular disease (HCC)    Pneumonia    PONV (postoperative nausea and vomiting)    Smoker     Patient Active Problem List   Diagnosis Date Noted   Brain metastasis ([place]) 11/07/2020   History of lung cancer 10/15/2020   Atherosclerosis of aorta ([place]) 10/15/2020   Pain in lateral left lower extremity 04/30/2020   Multiple thyroid nodules 04/03/2019   History of bladder cancer 09/30/2018   Encounter for antineoplastic immunotherapy 09/13/2017   Chronic insomnia 08/17/2017   Encounter for antineoplastic chemotherapy 06/17/2017   Goals of care, counseling/discussion 06/17/2017   Primary malignant neoplasm of right  lower lobe of lung ([place]) 04/29/2017   Counseling regarding end of life decision making 07/31/2014   Eczema 07/20/2011   MICROALBUMINURIA 03/25/2010   Osteoporosis 10/08/2009   Diabetes mellitus with no complication ([place]) 11/91/4782   Hyperlipidemia associated with type 2 diabetes mellitus ([place]) 09/03/2009   Allergic rhinitis 09/03/2009   ARTHRITIS 09/03/2009   CHICKENPOX, HX OF 09/03/2009    Past Surgical History:  Procedure Laterality Date   ANKLE FRACTURE SURGERY Left 10 years ago   "hard to wake up from anesthesia" per pt   APPLICATION OF CRANIAL NAVIGATION Right 11/07/2020   Procedure: APPLICATION OF CRANIAL NAVIGATION;  Surgeon: Earnie Larsson, MD;  Location: [facility];  Service: Neurosurgery;  Laterality: Right;   [place]   BREAST BIOPSY Right 1978   BENIGN CYST   BREAST EXCISIONAL BIOPSY Left 2011   Benign   BREAST EXCISIONAL BIOPSY Right 1985   Benign    CATARACT EXTRACTION     CATARACT EXTRACTION W/ INTRAOCULAR LENS IMPLANT Bilateral    COLONOSCOPY  2011   CRANIOTOMY Right 11/07/2020   Procedure: Right Sided Craniotomy for Tumor;  Surgeon: Earnie Larsson, MD;  Location: [place];  Service: Neurosurgery;  Laterality: Right;   ELECTROMAGNETIC NAVIGATION BROCHOSCOPY N/A 06/08/2017   Procedure: ELECTROMAGNETIC NAVIGATION BRONCHOSCOPY;  Surgeon: Flora Lipps, MD;  Location: [facility];  Service: Cardiopulmonary;  Laterality: N/A;   FRACTURE SURGERY     POLYPECTOMY  2011   TRANSURETHRAL RESECTION OF BLADDER TUMOR WITH MITOMYCIN-C N/A 05/04/2017  Procedure: TRANSURETHRAL RESECTION OF BLADDER TUMOR WITH gemcitabine;  Surgeon: Abbie Sons, MD;  Location: [facility];  Service: Urology;  Laterality: N/A;   TUBAL LIGATION       OB History   No obstetric history on file.     Family History  Problem Relation Age of Onset   Diabetes Mother    Hypothyroidism Mother    Cancer Sister 72       BREAST   Breast cancer Sister    Hypothyroidism Sister    Cancer Maternal  Grandmother        COLON   Colon cancer Maternal Grandmother 86   Stomach cancer Neg Hx     Social History   Tobacco Use   Smoking status: Former    Packs/day: 0.50    Years: 40.00    Pack years: 20.00    Types: Cigarettes    Quit date: 04/29/2017    Years since quitting: 3.8   Smokeless tobacco: Never  Vaping Use   Vaping Use: Never used  Substance Use Topics   Alcohol use: Not Currently    Comment: occassionally   Drug use: No    Home Medications Prior to Admission medications   Medication Sig Start Date End Date Taking? Authorizing Provider  acetaminophen (TYLENOL) 500 MG tablet Take 500 mg by mouth daily as needed for moderate pain or headache.    [provider]  amitriptyline (ELAVIL) 50 MG tablet Take 1 tablet (50 mg total) by mouth at bedtime. 10/15/20   Bedsole, Amy E, MD  apixaban (ELIQUIS) 5 MG TABS tablet Take 1 tablet (5 mg total) by mouth 2 (two) times daily. 01/06/21   Heilingoetter, Cassandra L, PA-C  atorvastatin (LIPITOR) 40 MG tablet 1 tablet  po daily 10/15/20   Bedsole, Amy E, MD  Calcium Citrate-Vitamin D (CITRACAL + D PO) Take 1 tablet by mouth daily.    [provider]  cholecalciferol (VITAMIN D) 25 MCG (1000 UNIT) tablet Take 1,000 Units by mouth daily.    [provider]  dexamethasone (DECADRON) 2 MG tablet Take 1 tablet (2 mg total) by mouth 2 (two) times daily with a meal. 11/08/20   Earnie Larsson, MD  HYDROcodone-acetaminophen (NORCO/VICODIN) 5-325 MG tablet Take 1 tablet by mouth every 4 (four) hours as needed for moderate pain. 11/08/20 11/08/21  Earnie Larsson, MD  levETIRAcetam (KEPPRA) 500 MG tablet Take 1 tablet (500 mg total) by mouth 2 (two) times daily. 11/08/20   Earnie Larsson, MD  lisinopril (ZESTRIL) 2.5 MG tablet TAKE 1 TABLET(2.5 MG) BY MOUTH DAILY 10/16/20   Diona Browner, Amy E, MD  LORazepam (ATIVAN) 0.5 MG tablet 1 tab po 30 minutes prior to radiation or MRI scans 11/21/20   Hayden Pedro, PA-C  Multiple Vitamin  (MULTIVITAMIN) capsule Take 1 capsule by mouth daily. Centrum Silver    [provider]  vitamin B-12 (CYANOCOBALAMIN) 1000 MCG tablet Take 1,000 mcg by mouth daily.    [provider]    Allergies    Codeine  Review of Systems   Review of Systems  Constitutional:  Negative for chills and fever.  HENT:  Negative for ear pain and sore throat.   Eyes:  Negative for pain and visual disturbance.  Respiratory:  Negative for cough and shortness of breath.   Cardiovascular:  Negative for chest pain and palpitations.  Gastrointestinal:  Negative for abdominal pain and vomiting.  Genitourinary:  Negative for dysuria and hematuria.  Musculoskeletal:  Negative for arthralgias and  back pain.  Skin:  Negative for color change and rash.  Neurological:  Positive for weakness. Negative for seizures, syncope, facial asymmetry and headaches.  All other systems reviewed and are negative.  Physical Exam Updated Vital Signs BP (!) 126/58   Pulse 94   Temp 98.9 F (37.2 C) (Oral)   Resp (!) 26   Ht 4\' 11"  (1.499 m)   Wt 61.2 kg   SpO2 100%   BMI 27.27 kg/m   Physical Exam Vitals and nursing note reviewed.  Constitutional:      General: She is not in acute distress.    Appearance: She is well-developed.  HENT:     Head: Normocephalic and atraumatic.  Eyes:     Conjunctiva/sclera: Conjunctivae normal.  Cardiovascular:     Rate and Rhythm: Normal rate and regular rhythm.     Heart sounds: No murmur heard. Pulmonary:     Effort: Pulmonary effort is normal. No respiratory distress.     Breath sounds: Normal breath sounds.  Abdominal:     Palpations: Abdomen is soft.     Tenderness: There is no abdominal tenderness.  Musculoskeletal:        General: No swelling.     Cervical back: Neck supple.  Skin:    General: Skin is warm and dry.     Capillary Refill: Capillary refill takes less than 2 seconds.  Neurological:     Mental Status: She is alert.     Comments: MENTAL  STATUS EXAM:    Orientation: Alert and oriented to person, place and time.  Memory: Cooperative, follows commands well.  Language: Speech is clear and language is normal.   CRANIAL NERVES:    CN 2 (Optic): Visual fields intact to confrontation.  CN 3,4,6 (EOM): Pupils equal and reactive to light. Full extraocular eye movement without nystagmus.  CN 5 (Trigeminal): Facial sensation is normal, no weakness of masticatory muscles.  CN 7 (Facial): No facial weakness or asymmetry.  CN 8 (Auditory): Auditory acuity grossly normal.  CN 9,10 (Glossophar): The uvula is midline, the palate elevates symmetrically.  CN 11 (spinal access): Normal sternocleidomastoid and trapezius strength.  CN 12 (Hypoglossal): The tongue is midline. No atrophy or fasciculations.Marland Kitchen   MOTOR:  Muscle Strength: 5/5RUE, 4/5LUE, 5/5RLE, 4/5LLE.   SENSATION:   Intact to light touch all four extremities.  GAIT: Gait not assessed   Psychiatric:        Mood and Affect: Mood normal.    ED Results / Procedures / Treatments   Labs (all labs ordered are listed, but only abnormal results are displayed) Labs Reviewed  CBC WITH DIFFERENTIAL/PLATELET - Abnormal; Notable for the following components:      Result Value   RBC 3.62 (*)    Hemoglobin 11.2 (*)    HCT 34.0 (*)    All other components within normal limits  BASIC METABOLIC PANEL - Abnormal; Notable for the following components:   Glucose, Bld 119 (*)    BUN 5 (*)    All other components within normal limits  HEPATIC FUNCTION PANEL - Abnormal; Notable for the following components:   Total Protein 5.8 (*)    Albumin 3.3 (*)    All other components within normal limits  RESP PANEL BY RT-PCR (FLU A&B, COVID) ARPGX2  PROTIME-INR  MAGNESIUM    EKG EKG Interpretation  Date/Time:  Wednesday March 05 2021 16:11:09 EST Ventricular Rate:  101 PR Interval:  126 QRS Duration: 90 QT Interval:  346 QTC Calculation:  449 R Axis:   60 Text Interpretation: Sinus  tachycardia Confirmed by Regan Lemming ([id number]) on 03/05/2021 5:34:02 PM  Radiology CT HEAD WO CONTRAST (5MM)  Result Date: 03/05/2021 CLINICAL DATA:  Metastatic right lung cancer, brain metastases EXAM: CT HEAD WITHOUT CONTRAST TECHNIQUE: Contiguous axial images were obtained from the base of the skull through the vertex without intravenous contrast. COMPARISON:  12/24/2020, 11/27/2020 FINDINGS: Brain: Postsurgical changes are again noted from right frontoparietal craniotomy. Suspected recurrent metastatic disease along the right frontal convexity at the gray-white interface measuring up to 1.1 x 0.8 cm reference image 25/3. Mild surrounding vasogenic edema. Multiple new hemorrhagic foci are seen throughout the left frontal, right frontal, right temporal, right parietal regions consistent with progressive metastatic lung cancer. Hemorrhagic metastatic deposits in the right frontal lobe measure up to 1.8 x 1.0 cm reference image 15/3. Left frontal hemorrhagic focus measures up to 1.3 x 0.8 cm. Significant surrounding vasogenic edema. There is leftward midline shift, measuring up to 8 mm at the level of the septum pellucidum. Smaller hemorrhagic foci within the right frontotemporal region image 20/3 measures 8 mm, and in the right temporoparietal region image 16 measures 0.9 cm. On the reconstructed images, multiple areas of high attenuation are seen within the dura, deep to the craniotomy site as well as along the right frontal convexity, concerning for dural spread of disease. Vascular: No hyperdense vessel or unexpected calcification. Skull: Postsurgical changes from right frontal parietal craniotomy. No acute fractures. Sinuses/Orbits: No acute finding. Other: None. IMPRESSION: 1. Marked progression of metastatic lung cancer, with multiple new hemorrhagic metastatic lesions as above. Significant right frontal vasogenic edema results in leftward midline shift measuring approximately 0.8 cm. Critical  Value/emergent results were called by telephone at the time of interpretation on 03/05/2021 at 5:37 pm to provider Regan Lemming , who verbally acknowledged these results. Electronically Signed   By: Randa Ngo M.D.   On: 03/05/2021 17:37    Procedures .Critical Care Performed by: Regan Lemming, MD Authorized by: Regan Lemming, MD   Critical care provider statement:    Critical care time (minutes):  64   Critical care was necessary to treat or prevent imminent or life-threatening deterioration of the following conditions:  CNS failure or compromise   Critical care was time spent personally by me on the following activities:  Development of treatment plan with patient or surrogate, discussions with consultants, discussions with primary provider, examination of patient, obtaining history from patient or surrogate, ordering and performing treatments and interventions, ordering and review of laboratory studies, ordering and review of radiographic studies, pulse oximetry and re-evaluation of patient's condition   Care discussed with: admitting provider     Medications Ordered in ED Medications  dexamethasone (DECADRON) injection 10 mg (has no administration in time range)    ED Course  I have reviewed the triage vital signs and the nursing notes.  Pertinent labs & imaging results that were available during my care of the patient were reviewed by me and considered in my medical decision making (see chart for details).    MDM Rules/Calculators/A&P                            75 year old female with a history of metastatic lung cancer status post tumor resection due to brain metastasis 4 months ago with residual left hemibody weakness presenting to the emergency department with gradual worsening left hemibody weakness and report of visual hallucinations and confusion.  The patient states that she thought should her family was present with her but had forgotten that they were not coming to  visit her for Thanksgiving.  She states she normally ambulates around the house without assistance but endorses worsening left lower extremity and left arm weakness.   On arrival, the patient was GCS 15, ABC intact, with a physical exam significant for left hemibody weakness that the patient states is worse than her baseline.  4/5 strength noted in the upper and lower extremities.  The patient states that symptoms have been ongoing and gradually worsening over the past few weeks.  Differential diagnosis includes stroke versus tumor recurrence.  Will obtain CT head and screening labs.  CT findings concerning for marked progression of metastatic lung cancer with multiple new hemorrhagic metastatic lesions as above, significant right frontal vasogenic edema resulted in leftward midline shift measuring approximately 0.8 cm.  The patient was administered 10 mg of IV Decadron for her vasogenic edema.  She continued to protect her airway in the emergency department and had persistent left hemibody weakness.  Dr. Glenford Peers of neurosurgery was consulted and will evaluate the patient.    Discussed the case with Dr. Glenford Peers who reviewed imaging.  The patient is currently on Eliquis and states that she did take the medicine this morning.  Spoke with pharmacy and will administer Kcentra for reversal given the patient's hemorrhagic metastasis with vasogenic edema.  Pulmonary critical care consulted for admission for close observation in the setting of the patient's hemorrhagic brain metastases and edema.  Final Clinical Impression(s) / ED Diagnoses Final diagnoses:  Weakness  Vasogenic brain edema ([place])  Lung cancer metastatic to brain [facility])    Rx / DC Orders ED Discharge Orders     None        Regan Lemming, MD 03/05/21 Mirian Capuchin    Regan Lemming, MD 03/05/21 1851

## 2021-03-05 NOTE — ED Triage Notes (Signed)
BIB [facility] from home. Increased weakness x2weeks. Hx Brain tumor removal 4 months ago. Family reported to EMS she has had "Visual hallucinations" about who has come to visit  and family states that these are the same symptoms she had before tumor was removed. Always weak on the left side.   20L AC 106 heart rate CBG 140 140/80

## 2021-03-05 NOTE — H&P (Signed)
NAME:  Katelyn Kennedy, MRN:  [id number], DOB:  02-17-1946, LOS: 0 ADMISSION DATE:  03/05/2021, CONSULTATION DATE:  11/23 REFERRING MD:  Dr. 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 She was admitted to [facility] in July on this year for resection of right posterior frontal dural based met and has since been off therapy. She was left with some residual left sided weakness as a result of tumor/surgery. She presented to [facility] ED 11/21 with complaints of worsening left sided weakness. Also with new onset delirium and visual hallucinations. Upon arrival to ED, CT head was immediately obtained and unfortunately demonstrated progression of metastatic lung cancer with multiple hemorrhagic lesions and vasogenic edema. Notably she is on Eliquis for DVT. Neurosurgery has evaluated in the ED and does not see a need for any surgical intervention at this time. They have requested PCCM to admit to ICU.   Pertinent  Medical History   has a past medical history of Allergic rhinitis, Arthritis, Diabetes mellitus without complication ([place]), Dyspnea, Essential hypertension (04/11/2019), History of chicken pox, History of hiatal hernia, lung ca (dx'd 04/2017), Peripheral vascular disease ([place]), Pneumonia, PONV (postoperative nausea and vomiting), and Smoker.   Significant Hospital Events: Including procedures, antibiotic start and stop dates in addition to other pertinent events   11/23 admit to ICU  Interim History / Subjective:    Objective   Blood pressure 129/67, pulse 97, temperature 98.9 F (37.2 C), temperature source Oral, resp. rate 13, height _0  (1.499 m), weight 61.2 kg, SpO2 98 %.       No intake or output data in the 24 hours ending 03/05/21 2011 Filed Weights   03/05/21 1603  Weight: 61.2 kg    Examination: General: elderly appearing female in NAD HENT:  [place]/AT, PERRL, no MRG Lungs: Clear bilateral breath sounds. No distress Cardiovascular: RRR, no MRG Abdomen: Soft, non-tender, non-disteded Extremities: no acute deformity or ROM limitation Neuro: Alert, oriented, non-focal. Symmetric exam. 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 CT this admission with multiple hemorrhagic lesions IPH - Admit to 4N for close neurological monitoring. - Unclear if she was still on Keppra at home. Will re-order.  - Decadron per neurosurgery - Neurosurgery following - Kcentra given in ED to for Eliquis reversal. Hold eliquis.   - Keep SBP < 111mHg  DM: managed by diet at home - Start SSI now as she will likely develop steroid induced hyperglycemia.   HTN HLD - continue home atorvastatin, lisinopril  DVT - holding Eliquis - May need to consider retrievable filter.  Best Practice (right click and "Reselect all SmartList Selections" daily)   Diet/type: Regular consistency (see orders) DVT prophylaxis: not indicated GI prophylaxis: N/A Lines: N/A Foley:  N/A Code Status:  full code Last date of multidisciplinary goals of care discussion _1   Labs   CBC: Recent Labs  Lab 03/05/21 1645  WBC 5.7  NEUTROABS 3.4  HGB 11.2*  HCT 34.0*  MCV 93.9  PLT 2892   Basic Metabolic Panel: Recent Labs  Lab 03/05/21 1645  NA 142  K 3.6  CL 108  CO2 26  GLUCOSE 119*  BUN 5*  CREATININE 0.70  CALCIUM 9.4  MG 1.7   GFR: Estimated Creatinine Clearance: 46.9 mL/min (by C-G formula based on SCr of 0.7 mg/dL). Recent Labs  Lab 03/05/21 1645  WBC 5.7    Liver Function Tests: Recent Labs  Lab 03/05/21 1645  AST 25  ALT 17  ALKPHOS 117  BILITOT 0.7  PROT 5.8*  ALBUMIN 3.3*   No results for input(s): LIPASE, AMYLASE in the last 168 hours. No results for input(s): AMMONIA in the last 168 hours.  ABG No results  found for: PHART, PCO2ART, PO2ART, HCO3, TCO2, ACIDBASEDEF, O2SAT   Coagulation Profile: Recent Labs  Lab 03/05/21 1645  INR 1.1    Cardiac Enzymes: No results for input(s): CKTOTAL, CKMB, CKMBINDEX, TROPONINI in the last 168 hours.  HbA1C: Hgb A1c MFr Bld  Date/Time Value Ref Range Status  10/11/2020 09:49 AM 5.9 4.6 - 6.5 % Final    Comment:    Glycemic Control Guidelines for People with Diabetes:Non Diabetic:  <6%Goal of Therapy: <7%Additional Action Suggested:  >8%   04/17/2020 10:39 AM 5.7 4.6 - 6.5 % Final    Comment:    Glycemic Control Guidelines for People with Diabetes:Non Diabetic:  <6%Goal of Therapy: <7%Additional Action Suggested:  >8%     CBG: No results for input(s): GLUCAP in the last 168 hours.  Review of Systems:   Bolds are positive  Constitutional: weight loss, gain, night sweats, Fevers, chills, fatigue .  HEENT: headaches, Sore throat, sneezing, nasal congestion, post nasal drip, Difficulty swallowing, Tooth/dental problems, visual complaints visual changes, ear ache CV:  chest pain, radiates:,Orthopnea, PND, swelling in lower extremities, dizziness, palpitations, syncope.  GI  heartburn, indigestion, abdominal pain, nausea, vomiting, diarrhea, change in bowel habits, loss of appetite, bloody stools.  Resp: cough, non-productive: , hemoptysis, dyspnea, chest pain, pleuritic.  Skin: rash or itching or icterus GU: dysuria, change in color of urine, urgency or frequency. flank pain, hematuria  MS: joint pain or swelling. decreased range of motion  Psych: change in mood or affect. depression or anxiety.  Neuro: difficulty with speech, weakness, numbness, ataxia    Past Medical History:  She,  has a past medical history of Allergic rhinitis, Arthritis, Diabetes mellitus without complication ([place]), Dyspnea, Essential hypertension (04/11/2019), History of chicken pox, History of hiatal hernia, lung ca (dx'd 04/2017), Peripheral vascular disease ([place]),  Pneumonia, PONV (postoperative nausea and vomiting), and Smoker.   Surgical History:   Past Surgical History:  Procedure Laterality Date   ANKLE FRACTURE SURGERY Left 10 years ago   "hard to wake up from anesthesia" per pt   APPLICATION OF CRANIAL NAVIGATION Right 11/07/2020   Procedure: APPLICATION OF CRANIAL NAVIGATION;  Surgeon: Earnie Larsson, MD;  Location: [place];  Service: Neurosurgery;  Laterality: Right;   [place]   BREAST BIOPSY Right 1978   BENIGN CYST   BREAST EXCISIONAL BIOPSY Left 2011   Benign   BREAST EXCISIONAL BIOPSY Right 1985   Benign    CATARACT EXTRACTION     CATARACT EXTRACTION W/ INTRAOCULAR LENS IMPLANT Bilateral    COLONOSCOPY  2011   CRANIOTOMY Right 11/07/2020   Procedure: Right Sided Craniotomy for Tumor;  Surgeon: Earnie Larsson, MD;  Location: [place];  Service: Neurosurgery;  Laterality: Right;   ELECTROMAGNETIC NAVIGATION BROCHOSCOPY N/A 06/08/2017   Procedure: ELECTROMAGNETIC NAVIGATION BRONCHOSCOPY;  Surgeon: Flora Lipps, MD;  Location: [facility];  Service: Cardiopulmonary;  Laterality: N/A;   FRACTURE SURGERY     POLYPECTOMY  2011   TRANSURETHRAL RESECTION OF BLADDER TUMOR  WITH MITOMYCIN-C N/A 05/04/2017   Procedure: TRANSURETHRAL RESECTION OF BLADDER TUMOR WITH gemcitabine;  Surgeon: Abbie Sons, MD;  Location: [facility];  Service: Urology;  Laterality: N/A;   TUBAL LIGATION       Social History:   reports that she quit smoking about 3 years ago. Her smoking use included cigarettes. She has a 20.00 pack-year smoking history. She has never used smokeless tobacco. She reports that she does not currently use alcohol. She reports that she does not use drugs.   Family History:  Her family history includes Breast cancer in her sister; Cancer in her maternal grandmother; Cancer (age of onset: 65) in her sister; Colon cancer (age of onset: 59) in her maternal grandmother; Diabetes in her mother; Hypothyroidism in her mother and sister.  There is no history of Stomach cancer.   Allergies Allergies  Allergen Reactions   Codeine Nausea Only     Home Medications  Prior to Admission medications   Medication Sig Start Date End Date Taking? Authorizing Provider  acetaminophen (TYLENOL) 500 MG tablet Take 500 mg by mouth daily as needed for moderate pain or headache.   Yes [provider]  amitriptyline (ELAVIL) 50 MG tablet Take 1 tablet (50 mg total) by mouth at bedtime. 10/15/20  Yes Bedsole, Amy E, MD  apixaban (ELIQUIS) 5 MG TABS tablet Take 1 tablet (5 mg total) by mouth 2 (two) times daily. 01/06/21  Yes Heilingoetter, Cassandra L, PA-C  atorvastatin (LIPITOR) 40 MG tablet 1 tablet  po daily Patient taking differently: Take 40 mg by mouth daily. 10/15/20  Yes Bedsole, Amy E, MD  Calcium Citrate-Vitamin D (CITRACAL + D PO) Take 1 tablet by mouth daily.   Yes [provider]  cholecalciferol (VITAMIN D) 25 MCG (1000 UNIT) tablet Take 1,000 Units by mouth daily.   Yes [provider]  dexamethasone (DECADRON) 2 MG tablet Take 1 tablet (2 mg total) by mouth 2 (two) times daily with a meal. 11/08/20  Yes Pool, Mallie Mussel, MD  HYDROcodone-acetaminophen (NORCO/VICODIN) 5-325 MG tablet Take 1 tablet by mouth every 4 (four) hours as needed for moderate pain. 11/08/20 11/08/21 Yes Pool, Mallie Mussel, MD  lisinopril (ZESTRIL) 2.5 MG tablet TAKE 1 TABLET(2.5 MG) BY MOUTH DAILY Patient taking differently: Take 2.5 mg by mouth daily. 10/16/20  Yes Bedsole, Amy E, MD  LORazepam (ATIVAN) 0.5 MG tablet 1 tab po 30 minutes prior to radiation or MRI scans 11/21/20  Yes Hayden Pedro, PA-C  Multiple Vitamin (MULTIVITAMIN) capsule Take 1 capsule by mouth daily. Centrum Silver   Yes [provider]  vitamin B-12 (CYANOCOBALAMIN) 1000 MCG tablet Take 1,000 mcg by mouth daily.   Yes [provider]  levETIRAcetam (KEPPRA) 500 MG tablet Take 1 tablet (500 mg total) by mouth 2 (two) times daily. Patient not taking:  Reported on 03/05/2021 11/08/20   Earnie Larsson, MD     Critical care time:      Georgann Housekeeper, AGACNP-BC [place] for personal pager PCCM on call pager [phone] until 7pm. Please call Elink 7p-7a. [phone]  03/05/2021 9:04 PM

## 2021-03-05 NOTE — ED Notes (Signed)
Monitored alarmed for Vtach. Strip printed out and given to EDP. Katelyn Kennedy was not sustained. No new orders at this time.

## 2021-03-05 NOTE — Consult Note (Addendum)
Reason for Consult: Left hemiparesis Referring Physician: Regan Lemming, MD   HPI: Katelyn Kennedy is an 75 y.o. female with a history of DM, dyspnea, HTN, PVD, 20 pack year smoking history, and metastatic lung cancer who is s/p right craniotomy for resection of tumor on 11/07/2020. She subsequently underwent stereotactic radiosurgery on 11/29/2020. Since her procedures, she reports that she was doing well until she recently stopped her "cortisone." Since stopping her steroid, she reports a slow onset of left-sided weakness and one day of confusion where she reports "I missed a day." She presented to the ED today due to her complaints of left-sided weakness.    Past Medical History:  Diagnosis Date   Allergic rhinitis    Arthritis    Diabetes mellitus without complication (HCC)    diet control/no meds per pt   Dyspnea    Essential hypertension 04/11/2019   History of chicken pox    History of hiatal hernia    lung ca dx'd 04/2017   Peripheral vascular disease (HCC)    Pneumonia    PONV (postoperative nausea and vomiting)    Smoker     Past Surgical History:  Procedure Laterality Date   ANKLE FRACTURE SURGERY Left 10 years ago   "hard to wake up from anesthesia" per pt   APPLICATION OF CRANIAL NAVIGATION Right 11/07/2020   Procedure: APPLICATION OF CRANIAL NAVIGATION;  Surgeon: Earnie Larsson, MD;  Location: [place];  Service: Neurosurgery;  Laterality: Right;   [place]   BREAST BIOPSY Right 1978   BENIGN CYST   BREAST EXCISIONAL BIOPSY Left 2011   Benign   BREAST EXCISIONAL BIOPSY Right 1985   Benign    CATARACT EXTRACTION     CATARACT EXTRACTION W/ INTRAOCULAR LENS IMPLANT Bilateral    COLONOSCOPY  2011   CRANIOTOMY Right 11/07/2020   Procedure: Right Sided Craniotomy for Tumor;  Surgeon: Earnie Larsson, MD;  Location: [place];  Service: Neurosurgery;  Laterality: Right;   ELECTROMAGNETIC NAVIGATION BROCHOSCOPY N/A 06/08/2017   Procedure: ELECTROMAGNETIC NAVIGATION  BRONCHOSCOPY;  Surgeon: Flora Lipps, MD;  Location: [facility];  Service: Cardiopulmonary;  Laterality: N/A;   FRACTURE SURGERY     POLYPECTOMY  2011   TRANSURETHRAL RESECTION OF BLADDER TUMOR WITH MITOMYCIN-C N/A 05/04/2017   Procedure: TRANSURETHRAL RESECTION OF BLADDER TUMOR WITH gemcitabine;  Surgeon: Abbie Sons, MD;  Location: [facility];  Service: Urology;  Laterality: N/A;   TUBAL LIGATION      Family History  Problem Relation Age of Onset   Diabetes Mother    Hypothyroidism Mother    Cancer Sister 59       BREAST   Breast cancer Sister    Hypothyroidism Sister    Cancer Maternal Grandmother        COLON   Colon cancer Maternal Grandmother 49   Stomach cancer Neg Hx     Social History:  reports that she quit smoking about 3 years ago. Her smoking use included cigarettes. She has a 20.00 pack-year smoking history. She has never used smokeless tobacco. She reports that she does not currently use alcohol. She reports that she does not use drugs.  Allergies:  Allergies  Allergen Reactions   Codeine Nausea Only    Medications: I have reviewed the patient's current medications.  Results for orders placed or performed during the hospital encounter of 03/05/21 (from the past 48 hour(s))  CBC with Differential     Status: Abnormal   Collection Time: 03/05/21  4:45 PM  Result Value Ref Range   WBC 5.7 4.0 - 10.5 K/uL   RBC 3.62 (L) 3.87 - 5.11 MIL/uL   Hemoglobin 11.2 (L) 12.0 - 15.0 g/dL   HCT 34.0 (L) 36.0 - 46.0 %   MCV 93.9 80.0 - 100.0 fL   MCH 30.9 26.0 - 34.0 pg   MCHC 32.9 30.0 - 36.0 g/dL   RDW 12.4 11.5 - 15.5 %   Platelets 256 150 - 400 K/uL   nRBC 0.0 0.0 - 0.2 %   Neutrophils Relative % 60 %   Neutro Abs 3.4 1.7 - 7.7 K/uL   Lymphocytes Relative 28 %   Lymphs Abs 1.6 0.7 - 4.0 K/uL   Monocytes Relative 11 %   Monocytes Absolute 0.6 0.1 - 1.0 K/uL   Eosinophils Relative 1 %   Eosinophils Absolute 0.1 0.0 - 0.5 K/uL   Basophils Relative 0 %    Basophils Absolute 0.0 0.0 - 0.1 K/uL   Immature Granulocytes 0 %   Abs Immature Granulocytes 0.02 0.00 - 0.07 K/uL    Comment: Performed at [facility], [address] [place], [place] [address]  Basic metabolic panel     Status: Abnormal   Collection Time: 03/05/21  4:45 PM  Result Value Ref Range   Sodium 142 135 - 145 mmol/L   Potassium 3.6 3.5 - 5.1 mmol/L   Chloride 108 98 - 111 mmol/L   CO2 26 22 - 32 mmol/L   Glucose, Bld 119 (H) 70 - 99 mg/dL    Comment: Glucose reference range applies only to samples taken after fasting for at least 8 hours.   BUN 5 (L) 8 - 23 mg/dL   Creatinine, Ser 0.70 0.44 - 1.00 mg/dL   Calcium 9.4 8.9 - 10.3 mg/dL   GFR, Estimated >60 >60 mL/min    Comment: (NOTE) Calculated using the CKD-EPI Creatinine Equation (2021)    Anion gap 8 5 - 15    Comment: Performed at [facility] [address] [place], [place] [address]  Protime-INR     Status: None   Collection Time: 03/05/21  4:45 PM  Result Value Ref Range   Prothrombin Time 14.4 11.4 - 15.2 seconds   INR 1.1 0.8 - 1.2    Comment: (NOTE) INR goal varies based on device and disease states. Performed at [facility], [place] [address] [place], [place] [address]   Magnesium     Status: None   Collection Time: 03/05/21  4:45 PM  Result Value Ref Range   Magnesium 1.7 1.7 - 2.4 mg/dL    Comment: Performed at [facility] [address], [place], [place] [address]  Hepatic function panel     Status: Abnormal   Collection Time: 03/05/21  4:45 PM  Result Value Ref Range   Total Protein 5.8 (L) 6.5 - 8.1 g/dL   Albumin 3.3 (L) 3.5 - 5.0 g/dL   AST 25 15 - 41 U/L   ALT 17 0 - 44 U/L   Alkaline Phosphatase 117 38 - 126 U/L   Total Bilirubin 0.7 0.3 - 1.2 mg/dL   Bilirubin, Direct 0.1 0.0 - 0.2 mg/dL   Indirect Bilirubin 0.6 0.3 - 0.9 mg/dL    Comment: Performed at [facility] [address] [place], [place] [address]    CT HEAD WO CONTRAST (5MM)  Result  Date: 03/05/2021 CLINICAL DATA:  Metastatic right lung cancer, brain metastases EXAM:  CT HEAD WITHOUT CONTRAST TECHNIQUE: Contiguous axial images were obtained from the base of the skull through the vertex without intravenous contrast. COMPARISON:  12/24/2020, 11/27/2020 FINDINGS: Brain: Postsurgical changes are again noted from right frontoparietal craniotomy. Suspected recurrent metastatic disease along the right frontal convexity at the gray-white interface measuring up to 1.1 x 0.8 cm reference image 25/3. Mild surrounding vasogenic edema. Multiple new hemorrhagic foci are seen throughout the left frontal, right frontal, right temporal, right parietal regions consistent with progressive metastatic lung cancer. Hemorrhagic metastatic deposits in the right frontal lobe measure up to 1.8 x 1.0 cm reference image 15/3. Left frontal hemorrhagic focus measures up to 1.3 x 0.8 cm. Significant surrounding vasogenic edema. There is leftward midline shift, measuring up to 8 mm at the level of the septum pellucidum. Smaller hemorrhagic foci within the right frontotemporal region image 20/3 measures 8 mm, and in the right temporoparietal region image 16 measures 0.9 cm. On the reconstructed images, multiple areas of high attenuation are seen within the dura, deep to the craniotomy site as well as along the right frontal convexity, concerning for dural spread of disease. Vascular: No hyperdense vessel or unexpected calcification. Skull: Postsurgical changes from right frontal parietal craniotomy. No acute fractures. Sinuses/Orbits: No acute finding. Other: None. IMPRESSION: 1. Marked progression of metastatic lung cancer, with multiple new hemorrhagic metastatic lesions as above. Significant right frontal vasogenic edema results in leftward midline shift measuring approximately 0.8 cm. Critical Value/emergent results were called by telephone at the time of interpretation on 03/05/2021 at 5:37 pm to provider Regan Lemming ,  who verbally acknowledged these results. Electronically Signed   By: Randa Ngo M.D.   On: 03/05/2021 17:37    ROS: Per HPI Blood pressure 115/73, pulse 97, temperature 98.9 F (37.2 C), temperature source Oral, resp. rate (!) 22, height 4\' 11"  (1.499 m), weight 61.2 kg, SpO2 98 %.  Physical Exam: Patient is awake, A/O X 4, and conversant. She is in NAD and VSS. Thought content appropriate.  Speech appropriate and fluent without evidence of aphasia. Able to follow 3 step commands without difficulty.  No dysarthria present.  Cranial Nerves: II: Visual fields grossly normal,  III,IV, VI: ptosis not present, extra-ocular motions intact bilaterally, pupils equal, round, reactive to light and accommodation V,VII: smile symmetric, facial light touch sensation normal bilaterally VIII: hearing normal bilaterally IX,X: uvula rises symmetrically XI: Shoulder shrug is impaired on the left XII: midline tongue extension  Motor: Right :  Upper extremity   5/5                                      Left:     Upper extremity   4/5             Lower extremity   5/5                                                  Lower extremity   4/5   Sensory: Pinprick and light touch intact throughout, bilaterally   Assessment/Plan: 75 y.o. female with a history of metastatic lung cancer who is s/p right craniotomy for resection of tumor. She has had a gradual onset and progression of left-sided hemiparesis over the last few weeks. Her neurological examination revealed  4/5 weakness in her LUE and LLE with left accessory nerve palsy. Her CTH revealed progression of metastatic lung cancer with multiple new hemorrhagic metastatic lesions and significant right frontal vasogenic edema with approximately 8 mm of leftward MLS. It was recommended that she be admitted to the neuro ICU due to the significant amount of cerebral edema as she is high risk for neurological deterioration. She will need oncology consult and possibly  palliative medicine consult. No acute neurosurgical intervention is need at this time. If patient has neurological decline, can consider 3% hypertonic saline.   -Admit to neuro ICU  -Frequent neuro checks -Decadron 4mg  Q6H -Keppra 500 mg BID -Eliquis reversal per pharmacy -MRI brain with and without contrast    Marvis Moeller, DNP, NP-C 03/05/2021, 7:06 PM   Addendum:  Patient seen and examined.  Agree with above.   75 year old female with  Metastatic lung cancer, status post right frontal craniotomy, chemoradiation  -CT brain shows multiple small hemorrhagic metastases with significant vasogenic edema and 8 mm of midline shift.  Her neurologic exam is benign at this time.  Recommend continue close neuro monitoring.  Hold anticoagulation until stable imaging. -Continue Decadron 4 mg every 6 hours -Continue antiepileptics -MRI brain with and without contrast pending   Thank you for allowing me to participate in this patient's care.  Please do not hesitate to call with questions or concerns.   Elwin Sleight, [facility] Cell: [phone]

## 2021-03-06 ENCOUNTER — Encounter: Payer: Self-pay | Admitting: Radiation Oncology

## 2021-03-06 ENCOUNTER — Inpatient Hospital Stay (HOSPITAL_COMMUNITY): Payer: PPO

## 2021-03-06 DIAGNOSIS — Z66 Do not resuscitate: Secondary | ICD-10-CM | POA: Diagnosis not present

## 2021-03-06 DIAGNOSIS — G936 Cerebral edema: Secondary | ICD-10-CM | POA: Diagnosis not present

## 2021-03-06 DIAGNOSIS — M810 Age-related osteoporosis without current pathological fracture: Secondary | ICD-10-CM | POA: Diagnosis not present

## 2021-03-06 DIAGNOSIS — J309 Allergic rhinitis, unspecified: Secondary | ICD-10-CM | POA: Diagnosis not present

## 2021-03-06 DIAGNOSIS — E1165 Type 2 diabetes mellitus with hyperglycemia: Secondary | ICD-10-CM | POA: Diagnosis not present

## 2021-03-06 DIAGNOSIS — I619 Nontraumatic intracerebral hemorrhage, unspecified: Secondary | ICD-10-CM | POA: Diagnosis not present

## 2021-03-06 DIAGNOSIS — C3431 Malignant neoplasm of lower lobe, right bronchus or lung: Secondary | ICD-10-CM | POA: Diagnosis not present

## 2021-03-06 DIAGNOSIS — E1141 Type 2 diabetes mellitus with diabetic mononeuropathy: Secondary | ICD-10-CM | POA: Diagnosis not present

## 2021-03-06 DIAGNOSIS — Z923 Personal history of irradiation: Secondary | ICD-10-CM | POA: Diagnosis not present

## 2021-03-06 DIAGNOSIS — Z833 Family history of diabetes mellitus: Secondary | ICD-10-CM | POA: Diagnosis not present

## 2021-03-06 DIAGNOSIS — R443 Hallucinations, unspecified: Secondary | ICD-10-CM | POA: Diagnosis not present

## 2021-03-06 DIAGNOSIS — Z8551 Personal history of malignant neoplasm of bladder: Secondary | ICD-10-CM | POA: Diagnosis not present

## 2021-03-06 DIAGNOSIS — G9341 Metabolic encephalopathy: Secondary | ICD-10-CM | POA: Diagnosis not present

## 2021-03-06 DIAGNOSIS — R29898 Other symptoms and signs involving the musculoskeletal system: Secondary | ICD-10-CM | POA: Diagnosis not present

## 2021-03-06 DIAGNOSIS — I160 Hypertensive urgency: Secondary | ICD-10-CM | POA: Diagnosis not present

## 2021-03-06 DIAGNOSIS — L89151 Pressure ulcer of sacral region, stage 1: Secondary | ICD-10-CM | POA: Diagnosis not present

## 2021-03-06 DIAGNOSIS — C7931 Secondary malignant neoplasm of brain: Secondary | ICD-10-CM | POA: Diagnosis not present

## 2021-03-06 DIAGNOSIS — Z20822 Contact with and (suspected) exposure to covid-19: Secondary | ICD-10-CM | POA: Diagnosis not present

## 2021-03-06 DIAGNOSIS — R2689 Other abnormalities of gait and mobility: Secondary | ICD-10-CM | POA: Diagnosis not present

## 2021-03-06 DIAGNOSIS — R531 Weakness: Secondary | ICD-10-CM | POA: Diagnosis not present

## 2021-03-06 DIAGNOSIS — Z515 Encounter for palliative care: Secondary | ICD-10-CM | POA: Diagnosis not present

## 2021-03-06 DIAGNOSIS — R4182 Altered mental status, unspecified: Secondary | ICD-10-CM | POA: Diagnosis not present

## 2021-03-06 DIAGNOSIS — E785 Hyperlipidemia, unspecified: Secondary | ICD-10-CM | POA: Diagnosis not present

## 2021-03-06 DIAGNOSIS — E1151 Type 2 diabetes mellitus with diabetic peripheral angiopathy without gangrene: Secondary | ICD-10-CM | POA: Diagnosis not present

## 2021-03-06 DIAGNOSIS — G8194 Hemiplegia, unspecified affecting left nondominant side: Secondary | ICD-10-CM | POA: Diagnosis not present

## 2021-03-06 DIAGNOSIS — Z961 Presence of intraocular lens: Secondary | ICD-10-CM | POA: Diagnosis not present

## 2021-03-06 DIAGNOSIS — R441 Visual hallucinations: Secondary | ICD-10-CM | POA: Diagnosis not present

## 2021-03-06 DIAGNOSIS — I10 Essential (primary) hypertension: Secondary | ICD-10-CM | POA: Diagnosis not present

## 2021-03-06 DIAGNOSIS — M1990 Unspecified osteoarthritis, unspecified site: Secondary | ICD-10-CM | POA: Diagnosis not present

## 2021-03-06 LAB — GLUCOSE, CAPILLARY
Glucose-Capillary: 154 mg/dL — ABNORMAL HIGH (ref 70–99)
Glucose-Capillary: 192 mg/dL — ABNORMAL HIGH (ref 70–99)
Glucose-Capillary: 218 mg/dL — ABNORMAL HIGH (ref 70–99)
Glucose-Capillary: 232 mg/dL — ABNORMAL HIGH (ref 70–99)

## 2021-03-06 LAB — CBC
HCT: 35.6 % — ABNORMAL LOW (ref 36.0–46.0)
Hemoglobin: 12 g/dL (ref 12.0–15.0)
MCH: 30.9 pg (ref 26.0–34.0)
MCHC: 33.7 g/dL (ref 30.0–36.0)
MCV: 91.8 fL (ref 80.0–100.0)
Platelets: 283 10*3/uL (ref 150–400)
RBC: 3.88 MIL/uL (ref 3.87–5.11)
RDW: 12.4 % (ref 11.5–15.5)
WBC: 6.5 10*3/uL (ref 4.0–10.5)
nRBC: 0 % (ref 0.0–0.2)

## 2021-03-06 LAB — BASIC METABOLIC PANEL
Anion gap: 8 (ref 5–15)
BUN: 7 mg/dL — ABNORMAL LOW (ref 8–23)
CO2: 24 mmol/L (ref 22–32)
Calcium: 9.3 mg/dL (ref 8.9–10.3)
Chloride: 109 mmol/L (ref 98–111)
Creatinine, Ser: 0.7 mg/dL (ref 0.44–1.00)
GFR, Estimated: >60 mL/min (ref >60)
Glucose, Bld: 199 mg/dL — ABNORMAL HIGH (ref 70–99)
Potassium: 3.9 mmol/L (ref 3.5–5.1)
Sodium: 141 mmol/L (ref 135–145)

## 2021-03-06 LAB — MAGNESIUM: Magnesium: 2.3 mg/dL (ref 1.7–2.4)

## 2021-03-06 LAB — MRSA NEXT GEN BY PCR, NASAL: MRSA by PCR Next Gen: NOT DETECTED

## 2021-03-06 MED ORDER — LORAZEPAM 0.5 MG PO TABS
0.5000 mg | ORAL_TABLET | Freq: Once | ORAL | Status: AC
  Administered 2021-03-06: 0.5 mg via ORAL
  Filled 2021-03-06: qty 1

## 2021-03-06 MED ORDER — FAMOTIDINE 20 MG PO TABS
20.0000 mg | ORAL_TABLET | Freq: Every day | ORAL | Status: DC
  Administered 2021-03-06 – 2021-03-19 (×14): 20 mg via ORAL
  Filled 2021-03-06 (×14): qty 1

## 2021-03-06 MED ORDER — GADOBUTROL 1 MMOL/ML IV SOLN
6.0000 mL | Freq: Once | INTRAVENOUS | Status: AC | PRN
  Administered 2021-03-06: 6 mL via INTRAVENOUS

## 2021-03-06 MED ORDER — DEXAMETHASONE SODIUM PHOSPHATE 4 MG/ML IJ SOLN
4.0000 mg | Freq: Four times a day (QID) | INTRAMUSCULAR | Status: DC
  Administered 2021-03-06 – 2021-03-11 (×22): 4 mg via INTRAVENOUS
  Filled 2021-03-06 (×23): qty 1

## 2021-03-06 NOTE — Progress Notes (Signed)
PCCM Progress Note  Patient unable to appropriate engage in medical care due to confusion.  I updated daughter, [name], on patients recent MRI brain. Neurosurgery recommended palliative radiation. Daughter wishes to pursue this option. I have discussed patient's case with on-call Rad Onc who plans for potential radiation tomorrow. We will arrange for transfer tonight to [facility] for treatment.  Care Time: 50 min

## 2021-03-06 NOTE — Progress Notes (Signed)
Patient admitted with increasing confusion.  Work-up demonstrates multifocal metastatic disease worse in the right frontal region.  Metastasis associated with hemorrhage.  MRI scan pending for today.  No evidence of significant mass lesions.  I am unlikely to be able to offer any type of surgical intervention that will improve her situation.  I think she probably will require palliative whole brain radiation.

## 2021-03-06 NOTE — Progress Notes (Signed)
Report called to Blyn, Candace at [phone].

## 2021-03-06 NOTE — Progress Notes (Addendum)
NAME:  Katelyn Kennedy, MRN:  [id number], DOB:  1946/01/07, LOS: 1 ADMISSION DATE:  03/05/2021, CONSULTATION DATE:  11/23 REFERRING MD:  Dr. 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 She was admitted to [facility] in July on this year for resection of right posterior frontal dural based met and has since been off therapy. She was left with some residual left sided weakness as a result of tumor/surgery. She presented to [facility] ED 11/21 with complaints of worsening left sided weakness. Also with new onset delirium and visual hallucinations. Upon arrival to ED, CT head was immediately obtained and unfortunately demonstrated progression of metastatic lung cancer with multiple hemorrhagic lesions and vasogenic edema. Notably she is on Eliquis for DVT. Neurosurgery has evaluated in the ED and does not see a need for any surgical intervention at this time. They have requested PCCM to admit to ICU.   Pertinent  Medical History   has a past medical history of Allergic rhinitis, Arthritis, Diabetes mellitus without complication ([place]), Dyspnea, Essential hypertension (04/11/2019), History of chicken pox, History of hiatal hernia, lung ca (dx'd 04/2017), Peripheral vascular disease ([place]), Pneumonia, PONV (postoperative nausea and vomiting), and Smoker.   Significant Hospital Events: Including procedures, antibiotic start and stop dates in addition to other pertinent events   11/23 admit to ICU  Interim History / Subjective:  Afebrile and normotensive this am Objective   Blood pressure 119/65, pulse (!) 102, temperature 98 F (36.7 C), temperature source Oral, resp. rate (!) 24, height '4\' 11"'  (1.499 m), weight 60.3 kg, SpO2 98 %.        Intake/Output Summary (Last 24 hours) at 03/06/2021 0725 Last data filed at 03/06/2021 0700 Gross per 24 hour  Intake 538.7 ml  Output 350 ml   Net 188.7 ml   Filed Weights   03/05/21 1603 03/05/21 2214  Weight: 61.2 kg 60.3 kg   Physical Exam: General: Elderly-appearing, no acute distress. Slightly confused and delayed in response but able to follow commands and answer questions appropriately HENT: [place], AT, OP clear, MMM Eyes: EOMI, no scleral icterus Lymph: no cervical lymphadenopath Respiratory: Clear to auscultation bilaterally.  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 CT this admission with multiple hemorrhagic lesions IPH 2/2 metastatic brain lesions - Admit to 4N for close neurological monitoring. - Neuro checks q1h - Unclear if she was still on Keppra at home. Will re-order.  - Decadron per neurosurgery - Neurosurgery following. MRI ordered. Will contact [place] team for timing - Kcentra given in ED to for Eliquis reversal. Hold eliquis.   - Keep SBP < 121mHg  DM: managed by diet at home - SSI, will likely develop steroid induced hyperglycemia.   HTN HLD - continue home atorvastatin, lisinopril  DVT - holding Eliquis - May need to consider retrievable filter.  Best Practice (  right click and "Reselect all SmartList Selections" daily)   Diet/type: Regular consistency (see orders) DVT prophylaxis: not indicated GI prophylaxis: N/A Lines: N/A Foley:  N/A Code Status:  full code Last date of multidisciplinary goals of care discussion '[ ]'   Critical  care time:     The patient is critically ill with high risk of deterioration in setting of progressive hemorrhagic brain mets with edema and midline shift and requires high complexity decision making for assessment and support, frequent evaluation and titration of therapies, application of advanced monitoring technologies and extensive interpretation of multiple databases.  Independent Critical Care Time: 86 Minutes.   Rodman Pickle, M.D. [facility] Pulmonary/Critical Care Medicine 03/06/2021 7:25 AM   Please see Amion for pager number to reach on-call Pulmonary and Critical Care Team.

## 2021-03-06 NOTE — Progress Notes (Signed)
[phone] [facility] called and report given.  She said it will be about 1945 before they arrive.

## 2021-03-06 NOTE — Progress Notes (Signed)
Report given to [employer], [place].

## 2021-03-06 NOTE — Progress Notes (Signed)
Pt's belongings in the room include: purse with wallet, clothing, slippers and hair clip

## 2021-03-06 NOTE — Progress Notes (Signed)
  Radiation Oncology         [phone] ________________________________  Re-evaluation Note - In Patient   Name: Katelyn Kennedy MRN: [id number]  Date: 03/06/2021  DOB: 1946-03-18  GP:[id number], Amy E, MD  No ref. provider found   REFERRING PHYSICIAN: Rodman Pickle  DIAGNOSIS: Recurrent brain metastasis, history of non-small cell lung cancer  HISTORY OF PRESENT ILLNESS::[name] is a 75 y.o. female who has a previous history of non-small cell lung cancer.  She initially presented with stage III disease and was initially treated with chest radiation therapy under the direction of Dr Kyung Rudd and radiosensitizing chemotherapy with Dr. Julien Nordmann.  Earlier this year the patient presented with solitary brain metastasis.  She underwent craniotomy and removal of this tumor revealing adenocarcinoma.  She then received 3 fractionated radiosurgery treatments to the operative bed.  The patient has done well until recently when she presented  with new onset delirium and visual hallucinations as well as significant confusion.  MRI revealed multiple hemorrhagic lesions and significant vasogenic edema.  She was admitted to the intensive care unit.  She was evaluated by Dr. Trenton Gammon and was not felt to be a surgical candidate.  Neurosurgery recommendations were for whole brain radiation therapy.  The patient will be transferred to [facility] long hospital tonight or tomorrow morning in preparation for whole brain radiation therapy.  She will remain on steroids.  Treatments to be given Friday afternoon after adequate Decadron dosing.  She will likely be treated again on Saturday, November 26.  Blair Promise, MD

## 2021-03-07 ENCOUNTER — Ambulatory Visit
Admit: 2021-03-07 | Discharge: 2021-03-07 | Disposition: A | Discharge status: Home / Self Care | Payer: PPO | Attending: Radiation Oncology | Admitting: Radiation Oncology

## 2021-03-07 ENCOUNTER — Inpatient Hospital Stay (HOSPITAL_COMMUNITY): Payer: PPO

## 2021-03-07 ENCOUNTER — Encounter: Payer: Self-pay | Admitting: Radiation Oncology

## 2021-03-07 DIAGNOSIS — G9341 Metabolic encephalopathy: Secondary | ICD-10-CM

## 2021-03-07 DIAGNOSIS — I82409 Acute embolism and thrombosis of unspecified deep veins of unspecified lower extremity: Secondary | ICD-10-CM | POA: Diagnosis not present

## 2021-03-07 DIAGNOSIS — C7931 Secondary malignant neoplasm of brain: Secondary | ICD-10-CM | POA: Diagnosis not present

## 2021-03-07 DIAGNOSIS — L8990 Pressure ulcer of unspecified site, unspecified stage: Secondary | ICD-10-CM | POA: Diagnosis present

## 2021-03-07 DIAGNOSIS — C3431 Malignant neoplasm of lower lobe, right bronchus or lung: Secondary | ICD-10-CM | POA: Diagnosis not present

## 2021-03-07 LAB — GLUCOSE, CAPILLARY
Glucose-Capillary: 135 mg/dL — ABNORMAL HIGH (ref 70–99)
Glucose-Capillary: 151 mg/dL — ABNORMAL HIGH (ref 70–99)
Glucose-Capillary: 160 mg/dL — ABNORMAL HIGH (ref 70–99)
Glucose-Capillary: 170 mg/dL — ABNORMAL HIGH (ref 70–99)
Glucose-Capillary: 172 mg/dL — ABNORMAL HIGH (ref 70–99)
Glucose-Capillary: 179 mg/dL — ABNORMAL HIGH (ref 70–99)
Glucose-Capillary: 75 mg/dL (ref 70–99)

## 2021-03-07 MED ORDER — DEXMEDETOMIDINE HCL IN NACL 200 MCG/50ML IV SOLN
0.4000 ug/kg/h | INTRAVENOUS | Status: DC
  Administered 2021-03-07 (×2): 0.4 ug/kg/h via INTRAVENOUS
  Filled 2021-03-07 (×2): qty 50

## 2021-03-07 MED ORDER — DEXTROSE 50 % IV SOLN: 12.5000 g | INTRAVENOUS | Status: AC

## 2021-03-07 MED ORDER — DEXTROSE 50 % IV SOLN
INTRAVENOUS | Status: AC
  Administered 2021-03-07: 25 mL
  Filled 2021-03-07: qty 50

## 2021-03-07 MED ORDER — ENSURE ENLIVE PO LIQD
237.0000 mL | Freq: Two times a day (BID) | ORAL | Status: DC
  Administered 2021-03-08 (×2): 237 mL via ORAL

## 2021-03-07 NOTE — Progress Notes (Signed)
Radiation Oncology         [phone] ________________________________  Name: Katelyn Kennedy MRN: [id number]  Date: 03/07/2021  DOB: Nov 01, 1945  Re-evaluation Note - In Patient   CC: [name], MD  No ref. provider found    Diagnosis:   Recurrent brain metastasis, history of non-small cell lung cancer  Interval Since Last Radiation:  3 months  Narrative:  Katelyn Kennedy is a 75 y.o. female who has a previous history of non-small cell lung cancer.  She initially presented with stage III disease and was initially treated with chest radiation therapy under the direction of Dr Kyung Rudd and radiosensitizing chemotherapy with Dr. Julien Nordmann.  Earlier this year the patient presented with solitary brain metastasis.  She underwent craniotomy and removal of this tumor revealing adenocarcinoma.  She then received 3 fractionated radiosurgery treatments to the operative bed.  The patient has done well until recently when she presented  with new onset delirium and visual hallucinations as well as significant confusion.  MRI revealed multiple hemorrhagic lesions and significant vasogenic edema.  She was admitted to the intensive care unit.  She was evaluated by Dr. Trenton Gammon and was not felt to be a surgical candidate.  Neurosurgery recommendations were for whole brain radiation therapy.    Yesterday evening the patient was transferred from [facility] to [place] long to initiate whole brain radiation therapy.  Overnight she was agitated and confused.    Started on IV Precedex and wrist restraints.  This afternoon the patient appears less confused.  She responds appropriately to questions but has a slight delayed response.  I discussed the x-ray findings in detail with the patient.  We discussed that whole brain radiation therapy was indicated.  She appears to respond appropriately to questions and remembers her previous radiation therapy well.  Earlier this afternoon I discussed the situation with  her daughter who lives in [place].  She understands the course of treatment side effects and potential toxicities and agrees with plans for radiation therapy.                  ALLERGIES:  is allergic to codeine.  Meds: No current facility-administered medications for this visit.   No current outpatient medications on file.   Facility-Administered Medications Ordered in Other Visits  Medication Dose Route Frequency Provider Last Rate Last Admin   0.9 % NaCl with KCl 20 mEq/ L  infusion   Intravenous Continuous Renee Pain, MD 60 mL/hr at 03/07/21 1400 Infusion Verify at 03/07/21 1400   acetaminophen (TYLENOL) tablet 650 mg  650 mg Oral Q4H PRN Renee Pain, MD       amitriptyline (ELAVIL) tablet 50 mg  50 mg Oral QHS Renee Pain, MD   50 mg at 03/06/21 2148   atorvastatin (LIPITOR) tablet 40 mg  40 mg Oral QHS Renee Pain, MD   40 mg at 03/06/21 2148   Chlorhexidine Gluconate Cloth 2 % PADS 6 each  6 each Topical Daily Renee Pain, MD   6 each at 03/07/21 0931   dexamethasone (DECADRON) injection 4 mg  4 mg Intravenous Q6H Margaretha Seeds, MD   4 mg at 03/07/21 1122   dexmedetomidine (PRECEDEX) 200 MCG/50ML (4 mcg/mL) infusion  0.4-1.2 mcg/kg/hr Intravenous Titrated Margaretmary Lombard, MD 6.1 mL/hr at 03/07/21 1400 0.4 mcg/kg/hr at 03/07/21 1400   docusate sodium (COLACE) capsule 100 mg  100 mg Oral BID PRN Renee Pain, MD       famotidine (PEPCID)  tablet 20 mg  20 mg Oral Daily Margaretha Seeds, MD   20 mg at 03/07/21 1141   feeding supplement (ENSURE ENLIVE / ENSURE PLUS) liquid 237 mL  237 mL Oral BID BM Rigoberto Noel, MD       insulin aspart (novoLOG) injection 0-15 Units  0-15 Units Subcutaneous Q4H Renee Pain, MD   3 Units at 03/07/21 1242   levETIRAcetam (KEPPRA) tablet 500 mg  500 mg Oral BID Renee Pain, MD   500 mg at 03/07/21 1142   multivitamin with minerals tablet 1 tablet  1 tablet Oral Daily Regan Lemming, MD   1 tablet at 03/06/21 0919    ondansetron (ZOFRAN) injection 4 mg  4 mg Intravenous Q6H PRN Renee Pain, MD       polyethylene glycol (MIRALAX / GLYCOLAX) packet 17 g  17 g Oral Daily PRN Renee Pain, MD       vitamin B-12 (CYANOCOBALAMIN) tablet 1,000 mcg  1,000 mcg Oral Daily Renee Pain, MD   1,000 mcg at 03/06/21 2458    Physical Findings: The patient is in no acute distress. Patient is alert and oriented.  Delayed response to questions.  Lying comfortably in her hospital bed.  Exhibits some mild confusion.  Responds appropriately to commands The lungs are clear.  The heart has a regular rhythm and rate.  She has some mild left-sided weakness which she has had since her previous craniotomy and postop radiation therapy.  Prior to admission the patient reports  ambulating with the assistance of a walker.  Lab Findings: Lab Results  Component Value Date   WBC 6.5 03/06/2021   HGB 12.0 03/06/2021   HCT 35.6 (L) 03/06/2021   MCV 91.8 03/06/2021   PLT 283 03/06/2021    Radiographic Findings: CT HEAD WO CONTRAST (5MM)  Result Date: 03/05/2021 CLINICAL DATA:  Metastatic right lung cancer, brain metastases EXAM: CT HEAD WITHOUT CONTRAST TECHNIQUE: Contiguous axial images were obtained from the base of the skull through the vertex without intravenous contrast. COMPARISON:  12/24/2020, 11/27/2020 FINDINGS: Brain: Postsurgical changes are again noted from right frontoparietal craniotomy. Suspected recurrent metastatic disease along the right frontal convexity at the gray-white interface measuring up to 1.1 x 0.8 cm reference image 25/3. Mild surrounding vasogenic edema. Multiple new hemorrhagic foci are seen throughout the left frontal, right frontal, right temporal, right parietal regions consistent with progressive metastatic lung cancer. Hemorrhagic metastatic deposits in the right frontal lobe measure up to 1.8 x 1.0 cm reference image 15/3. Left frontal hemorrhagic focus measures up to 1.3 x 0.8 cm.  Significant surrounding vasogenic edema. There is leftward midline shift, measuring up to 8 mm at the level of the septum pellucidum. Smaller hemorrhagic foci within the right frontotemporal region image 20/3 measures 8 mm, and in the right temporoparietal region image 16 measures 0.9 cm. On the reconstructed images, multiple areas of high attenuation are seen within the dura, deep to the craniotomy site as well as along the right frontal convexity, concerning for dural spread of disease. Vascular: No hyperdense vessel or unexpected calcification. Skull: Postsurgical changes from right frontal parietal craniotomy. No acute fractures. Sinuses/Orbits: No acute finding. Other: None. IMPRESSION: 1. Marked progression of metastatic lung cancer, with multiple new hemorrhagic metastatic lesions as above. Significant right frontal vasogenic edema results in leftward midline shift measuring approximately 0.8 cm. Critical Value/emergent results were called by telephone at the time of interpretation on 03/05/2021 at 5:37 pm to provider Regan Lemming , who verbally acknowledged these  results. Electronically Signed   By: Randa Ngo M.D.   On: 03/05/2021 17:37   MR BRAIN W WO CONTRAST  Result Date: 03/06/2021 CLINICAL DATA:  Mental status change. New onset delirium and visual hallucinations. Worsening left-sided weakness. History of non-small cell lung cancer with brain metastases. EXAM: MRI HEAD WITHOUT AND WITH CONTRAST TECHNIQUE: Multiplanar, multiecho pulse sequences of the brain and surrounding structures were obtained without and with intravenous contrast. CONTRAST:  57mL GADAVIST GADOBUTROL 1 MMOL/ML IV SOLN COMPARISON:  Head CT 03/05/2021 and MRI 11/27/2020 FINDINGS: Brain: Sequelae of right frontal craniotomy are again identified. A resection cavity containing blood products in the high posterior right frontal lobe has decreased in size from the prior MRI. Enhancement along the margins of the resection cavity is  greatest laterally and inferiorly, measuring up to 5 mm in thickness with direct comparison to the prior MRI limited due to diffuse T1 hyperintense blood products in the cavity on the prior study as well as interval cavity contraction. There is new diffuse dural enhancement over the right cerebral convexity with numerous areas of nodularity including thick, nodular enhancement over the anterior aspect of the right frontal lobe including along the floor of the anterior cranial fossa. Nodular dural enhancement also extends along the falx, greatest anteriorly. Heterogeneously enhancing intra-axial tumor anteriorly in the right frontal lobe measures 2.1 x 1.9 cm and extends medially to abut the falx. Similar tumor anteriorly in the left frontal lobe measures 1.8 x 1.1 cm. Both the intra-axial and dural-based tumor components are hemorrhagic. There is new extensive vasogenic edema anteriorly in the right frontal lobe which extends into the corpus callosum and results in 1.3 cm of leftward midline shift. There is milder vasogenic edema in the anterior left frontal lobe, and there are also small areas of vasogenic edema scattered elsewhere in the right frontal, right parietal, and right occipital lobes. FLAIR hyperintensity involving multiple right cerebral sulci, predominantly towards the vertex as well as medially along the interhemispheric fissure could reflect a small amount of subarachnoid hemorrhage, proteinaceous material, or tumor although there is not a significant amount of leptomeningeal enhancement in these regions to clearly indicate the presence of tumor. There could be trace subdural hemorrhage over the right cerebral convexity as well. No acute large territory infarct is present. Vascular: Major intracranial vascular flow voids are preserved. Skull and upper cervical spine: Right frontal craniotomy. No suspicious marrow lesion. Sinuses/Orbits: Bilateral cataract extraction. Paranasal sinuses and mastoid air  cells are clear. Other: None. IMPRESSION: 1. Marked progression of intracranial metastatic disease since 11/27/2020. This is largely dural spread of tumor over the right cerebral hemisphere with some intraparenchymal components, most notably in the anterior right frontal lobe where there is extensive vasogenic edema and 1.3 cm of leftward midline shift. 2. Possible small volume subarachnoid and subdural hemorrhage. Electronically Signed   By: Logan Bores M.D.   On: 03/06/2021 12:48   VAS [name] LOWER EXTREMITY VENOUS (DVT)  Result Date: 03/07/2021  Lower Venous DVT Study Patient Name:  DARNELLE DERRICK  Date of Exam:   03/07/2021 Medical Rec #: [id number]        Accession #:    [id number] Date of Birth: 11-17-1945       Patient Gender: F Patient Age:   38 years Exam Location:  [facility] Procedure:      VAS [name] LOWER EXTREMITY VENOUS (DVT) Referring Phys: RAKESH ALVA --------------------------------------------------------------------------------  Indications: Previous DVT - follow up exam.  Risk Factors: Cancer -  metastatic lung. Anticoagulation: Eliquis prior to admission. Comparison Study: Previous exam on 12/13/2020 positive for RLE (FV, PopV, & PTV) &                   LLE (gastroc) DVTs Performing Technologist: Jody Hill RVT, RDMS  Examination Guidelines: A complete evaluation includes B-mode imaging, spectral Doppler, color Doppler, and power Doppler as needed of all accessible portions of each vessel. Bilateral testing is considered an integral part of a complete examination. Limited examinations for reoccurring indications may be performed as noted. The reflux portion of the exam is performed with the patient in reverse Trendelenburg.  +---------+---------------+---------+-----------+----------+--------------+ RIGHT    CompressibilityPhasicitySpontaneityPropertiesThrombus Aging +---------+---------------+---------+-----------+----------+--------------+ CFV      Full           Yes      Yes                                  +---------+---------------+---------+-----------+----------+--------------+ SFJ      Full                                                        +---------+---------------+---------+-----------+----------+--------------+ FV Prox  Full           Yes      Yes                                 +---------+---------------+---------+-----------+----------+--------------+ FV Mid   Full           Yes      Yes                                 +---------+---------------+---------+-----------+----------+--------------+ FV DistalFull           Yes      Yes                                 +---------+---------------+---------+-----------+----------+--------------+ PFV      Full                                                        +---------+---------------+---------+-----------+----------+--------------+ POP      Full           Yes      Yes                                 +---------+---------------+---------+-----------+----------+--------------+ PTV      Full                                                        +---------+---------------+---------+-----------+----------+--------------+ PERO     Full                                                        +---------+---------------+---------+-----------+----------+--------------+   +---------+---------------+---------+-----------+----------+--------------+  LEFT     CompressibilityPhasicitySpontaneityPropertiesThrombus Aging +---------+---------------+---------+-----------+----------+--------------+ CFV      Full           Yes      Yes                                 +---------+---------------+---------+-----------+----------+--------------+ SFJ      Full                                                        +---------+---------------+---------+-----------+----------+--------------+ FV Prox  Full           Yes      Yes                                  +---------+---------------+---------+-----------+----------+--------------+ FV Mid   Full           Yes      Yes                                 +---------+---------------+---------+-----------+----------+--------------+ FV DistalFull           Yes      Yes                                 +---------+---------------+---------+-----------+----------+--------------+ PFV      Full                                                        +---------+---------------+---------+-----------+----------+--------------+ POP      Full           Yes      Yes                                 +---------+---------------+---------+-----------+----------+--------------+ PTV      Full                                                        +---------+---------------+---------+-----------+----------+--------------+ PERO     Full                                                        +---------+---------------+---------+-----------+----------+--------------+    Summary: BILATERAL: - No evidence of deep vein thrombosis seen in the lower extremities, bilaterally. - No evidence of superficial venous thrombosis in the lower extremities, bilaterally. -No evidence of popliteal cyst, bilaterally.   *See table(s) above for measurements and observations.    Preliminary     Impression:  Recurrent brain metastasis, history of non-small cell lung cancer  The patient has had previous radiation treatments to the brain but this was limited in scope and primarily cover the operative bed.  She would therefore be a candidate for additional radiation therapy.  Would recommend whole brain radiation therapy given the MRI findings.  I reviewed the consent form in detail with the patient and she agrees to treatment.  She signed her consent form appropriately.  Plan: Patient will proceed with her first radiation therapy this afternoon.  She will return on Saturday, November 26 as an inpatient for her second radiation  treatment directed at the whole brain.  Once medically stable for discharge she will receive her additional radiation treatments as an outpatient.  Anticipate between 8 and 10 radiation treatments.  ____________________________________ Gery Pray, MD

## 2021-03-07 NOTE — Progress Notes (Signed)
  Radiation Oncology         [phone]) [phone] ________________________________  Name: Katelyn Kennedy MRN: [id number]  Date: 03/07/2021  DOB: 03-31-1946  Weekly Radiation Therapy Management  Diagnosis:   Recurrent brain metastasis, history of non-small cell lung cancer   Current Dose: 3.0 Gy     Planned Dose:  30 Gy  Narrative . . . . . . . . The patient tolerated her first whole brain treatment well.                                 Set-up films were reviewed.                                 The chart was checked.   Plan . . . . . . . . . . . . Continue treatment as planned.  She will return tomorrow as an inpatient for her second whole brain radiation treatment.  ________________________________   Blair Promise, PhD, MD

## 2021-03-07 NOTE — Progress Notes (Signed)
BLE venous duplex has been completed.  Results can be found under chart review under CV PROC. 03/07/2021 1:04 PM Britiny Defrain RVT, RDMS
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
## 2021-03-07 NOTE — Progress Notes (Signed)
NAME:  Katelyn Kennedy, MRN:  [id number], DOB:  [birth year], LOS: 2 ADMISSION DATE:  03/05/2021, CONSULTATION DATE:  11/23 REFERRING MD:  Dr. 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 She was admitted to [facility] in July on this year for resection of right posterior frontal dural based met and has since been off therapy. She was left with some residual left sided weakness as a result of tumor/surgery. She presented to [facility] ED 11/21 with complaints of worsening left sided weakness. Also with new onset delirium and visual hallucinations. Upon arrival to ED, CT head was  demonstrated progression of metastatic lung cancer with multiple hemorrhagic lesions and vasogenic edema. Notably she is on Eliquis for DVT. Neurosurgery has evaluated in the ED and does not see a need for any surgical intervention at this time. They have requested PCCM to admit to ICU.   Pertinent  Medical History   has a past medical history of Allergic rhinitis, Arthritis, Diabetes mellitus without complication ([place]), Dyspnea, Essential hypertension (04/11/2019), History of chicken pox, History of hiatal hernia, lung ca (dx'd 04/2017), Peripheral vascular disease ([place]), Pneumonia, PONV (postoperative nausea and vomiting), and Smoker.   Significant Hospital Events: Including procedures, antibiotic start and stop dates in addition to other pertinent events   11/23 admit to ICU 11/24 transferred to [facility] for brain RT  Interim History / Subjective:   Agitated overnight, confused Started on IV Precedex and wrist restraints Afebrile  Objective   Blood pressure (!) 143/66, pulse 77, temperature 97.9 F (36.6 C), temperature source Axillary, resp. rate 12, height '4\' 11"'  (1.499 m), weight 61 kg, SpO2 96 %.        Intake/Output Summary (Last 24 hours) at 03/07/2021 1031 Last data filed at  03/07/2021 0600 Gross per 24 hour  Intake 1219.78 ml  Output 1600 ml  Net -380.22 ml    Filed Weights   03/05/21 2214 03/06/21 2023 03/07/21 0251  Weight: 60.3 kg 61 kg 61 kg   Physical Exam: General: Elderly-appearing, restrained, sedated on Precedex drip HENT: , AT, OP clear, MMM Eyes: EOMI, no scleral icterus Lymph: no cervical lymphadenopath Respiratory: No accessory muscle use, clear lungs Cardiovascular: S1-S2 regular GI: BS+, soft, nontender Extremities:-Edema,-tenderness Neuro: Oriented to name, not to place or time, grossly nonfocal, no meningeal signs  Labs reviewed from 11/24, normal electrolytes, no leukocytosis  CT head - new hemorrhagic metastatic lesions with right frontal vasogenic edema with leftward midline shift  Resolved Hospital Problem list     Assessment & Plan:   Acute metabolic encephalopathy  -Using Precedex for goal RASS 0 to -1 , not using Haldol for concern for lowering seizure threshold -Restraints as needed  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 CT this admission with multiple hemorrhagic lesions IPH 2/2 metastatic brain lesions, not a candidate for neurosurgery - - Neuro checks q1h -Continue Keppra - Decadron 4 every 6 - Kcentra given in ED to for Eliquis reversal. Hold eliquis.   - Keep SBP < 149mHg  DM: managed by diet at home - SSI, will likely develop steroid induced hyperglycemia.   HTN HLD - continue home atorvastatin, lisinopril  DVT noted 12/2020 - holding Eliquis - May need to consider retrievable filter depending on goals of care, will  repeat venous duplex  Best Practice (right click and "Reselect all SmartList Selections" daily)   Diet/type: Regular consistency (see orders) DVT prophylaxis: not indicated GI prophylaxis: N/A Lines: N/A Foley:  N/A Code Status:  full code Last date of multidisciplinary goals of care  discussion [ pending ] current plan is for whole brain RT  Critical care time: 16m   The patient is critically ill with high risk of deterioration in setting of progressive hemorrhagic brain mets with edema and midline shift and requires high complexity decision making for assessment and support, frequent evaluation and titration of therapies, application of advanced monitoring technologies and extensive interpretation of multiple databases.    RLeanna SatoAElsworth SohoMD LE[facility] 03/07/2021 10:31 AM   Please see Amion for pager number to reach on-call Pulmonary and Critical Care Team.

## 2021-03-07 NOTE — Progress Notes (Addendum)
11/25 0300: Pt confused, pulling at lines and trying to get out of bed. Pt believes she is at home. Pt states she wants to call the cops because we are "poisoning" her. Reorientation ineffective. Pt bed alarm on, floor matts in place & headstart bed alarm belt placed on pt. MD aware.   11/25 0540: pt agitation increasing, verbally and physically violent to staff, pt attempting to hit staff, pt making comments like "go back to the country you came from" "I will shoot you both with my gun" BL wrist restraints on per MD order & precedex gtt started per MD

## 2021-03-07 NOTE — Progress Notes (Signed)
jk

## 2021-03-07 NOTE — Progress Notes (Addendum)
[facility] Progress Note Patient Name: Katelyn Kennedy DOB: 07-28-1945 MRN: [id number]   Date of Service  03/07/2021  HPI/Events of Note  RN called to let us know that patient has ongoing delirium (not new). She has not slept at all and has intermittent disorientation. She is on room air, otherwise awake and in no distress when seen on camera. Does pick at her IV lines, bed covers and RN notes she tries to get out of bed. She has delirium due to brain mets which is unchanged, no focal changes noted. Seen on camera.   eICU Interventions  HR in 100s. BP in 130s. 99% o2 sat. Will not recommend using sedative medication at this time. I have ordered a precedex infusion to start only if her agitation / confusion gets worse. Have discussed with RN to use only if measures like reorientation and calming her, fail.      Intervention Category Major Interventions: Delirium, psychosis, severe agitation - evaluation and management  Katelyn Kennedy 03/07/2021, 3:24 AM  Addendum at 545 am Request for restraints. Patient confused, agitated and tries to hit staff at times. RN is starting precedex now, so will hopefully be able to use restraints only for a short time.

## 2021-03-08 ENCOUNTER — Ambulatory Visit
Admit: 2021-03-08 | Discharge: 2021-03-08 | Disposition: A | Discharge status: Home / Self Care | Payer: PPO | Attending: Radiation Oncology | Admitting: Radiation Oncology

## 2021-03-08 ENCOUNTER — Encounter (HOSPITAL_COMMUNITY): Payer: Self-pay | Admitting: Pulmonary Disease

## 2021-03-08 DIAGNOSIS — Z66 Do not resuscitate: Secondary | ICD-10-CM | POA: Diagnosis not present

## 2021-03-08 DIAGNOSIS — Z7189 Other specified counseling: Secondary | ICD-10-CM | POA: Diagnosis not present

## 2021-03-08 DIAGNOSIS — C3490 Malignant neoplasm of unspecified part of unspecified bronchus or lung: Secondary | ICD-10-CM | POA: Diagnosis not present

## 2021-03-08 DIAGNOSIS — I619 Nontraumatic intracerebral hemorrhage, unspecified: Secondary | ICD-10-CM | POA: Diagnosis not present

## 2021-03-08 DIAGNOSIS — C3431 Malignant neoplasm of lower lobe, right bronchus or lung: Secondary | ICD-10-CM | POA: Diagnosis not present

## 2021-03-08 DIAGNOSIS — Z515 Encounter for palliative care: Secondary | ICD-10-CM | POA: Diagnosis not present

## 2021-03-08 DIAGNOSIS — R9089 Other abnormal findings on diagnostic imaging of central nervous system: Secondary | ICD-10-CM | POA: Diagnosis not present

## 2021-03-08 DIAGNOSIS — E119 Type 2 diabetes mellitus without complications: Secondary | ICD-10-CM | POA: Diagnosis not present

## 2021-03-08 DIAGNOSIS — G936 Cerebral edema: Secondary | ICD-10-CM | POA: Diagnosis not present

## 2021-03-08 DIAGNOSIS — R531 Weakness: Secondary | ICD-10-CM

## 2021-03-08 DIAGNOSIS — C7931 Secondary malignant neoplasm of brain: Secondary | ICD-10-CM | POA: Diagnosis not present

## 2021-03-08 LAB — BASIC METABOLIC PANEL
Anion gap: 7 (ref 5–15)
BUN: 18 mg/dL (ref 8–23)
CO2: 25 mmol/L (ref 22–32)
Calcium: 9.3 mg/dL (ref 8.9–10.3)
Chloride: 110 mmol/L (ref 98–111)
Creatinine, Ser: 0.62 mg/dL (ref 0.44–1.00)
GFR, Estimated: >60 mL/min (ref >60)
Glucose, Bld: 134 mg/dL — ABNORMAL HIGH (ref 70–99)
Potassium: 4 mmol/L (ref 3.5–5.1)
Sodium: 142 mmol/L (ref 135–145)

## 2021-03-08 LAB — CBC
HCT: 35.7 % — ABNORMAL LOW (ref 36.0–46.0)
Hemoglobin: 11.5 g/dL — ABNORMAL LOW (ref 12.0–15.0)
MCH: 30.6 pg (ref 26.0–34.0)
MCHC: 32.2 g/dL (ref 30.0–36.0)
MCV: 94.9 fL (ref 80.0–100.0)
Platelets: 300 10*3/uL (ref 150–400)
RBC: 3.76 MIL/uL — ABNORMAL LOW (ref 3.87–5.11)
RDW: 12.6 % (ref 11.5–15.5)
WBC: 13.9 10*3/uL — ABNORMAL HIGH (ref 4.0–10.5)
nRBC: 0 % (ref 0.0–0.2)

## 2021-03-08 LAB — GLUCOSE, CAPILLARY
Glucose-Capillary: 134 mg/dL — ABNORMAL HIGH (ref 70–99)
Glucose-Capillary: 141 mg/dL — ABNORMAL HIGH (ref 70–99)
Glucose-Capillary: 141 mg/dL — ABNORMAL HIGH (ref 70–99)
Glucose-Capillary: 188 mg/dL — ABNORMAL HIGH (ref 70–99)
Glucose-Capillary: 193 mg/dL — ABNORMAL HIGH (ref 70–99)
Glucose-Capillary: 250 mg/dL — ABNORMAL HIGH (ref 70–99)

## 2021-03-08 MED ORDER — ORAL CARE MOUTH RINSE
15.0000 mL | Freq: Two times a day (BID) | OROMUCOSAL | Status: DC
  Administered 2021-03-08 – 2021-03-19 (×18): 15 mL via OROMUCOSAL

## 2021-03-08 NOTE — Consult Note (Signed)
Palliative Care Consult Note                                  Date: 03/08/2021   Patient Name: Katelyn Kennedy  DOB: 1945-06-18  MRN: [id number]  Age / Sex: 75 y.o., female  PCP: [name], MD Referring Physician: Rigoberto Noel, MD  Reason for Consultation: Establishing goals of care  HPI/Patient Profile: Palliative Care consult requested for goals of care discussion in this 75 y.o. 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  She was admitted on 03/05/2021 from home with worsening left-sided weakness.  CT of head showed progression of metastatic lung cancer with multiple hemorrhagic lesions and vasogenic edema.  Patient has been seen by Dr. Sondra Come ([place]) with initiation of whole brain radiation.   Past Medical History:  Diagnosis Date   Allergic rhinitis    Arthritis    Diabetes mellitus without complication (HCC)    diet control/no meds per pt   Dyspnea    Essential hypertension 04/11/2019   History of chicken pox    History of hiatal hernia    lung ca dx'd 04/2017   Peripheral vascular disease (HCC)    Pneumonia    PONV (postoperative nausea and vomiting)    Smoker      Subjective:   This NP Osborne Oman reviewed medical records, received report from team, assessed the patient and then met at the patient's bedside with Katelyn Kennedy to discuss diagnosis, prognosis, GOC, EOL wishes disposition and options.  Katelyn Kennedy is awake, alert and oriented x3.  She is sitting up in bed on the phone with family member.  Is requesting assistance in ordering lunch.  Denies pain or shortness of breath.  Of note patient did have altered mental status however this seems improved/resolved is all questions answered appropriately and patient is able to appropriately engage  in discussions.  I offered to include daughters and discussions however patient declined shares she will be able to relay information when she speaks to them later.   Concept of Palliative Care was introduced as specialized medical care for people and their families living with serious illness.  It focuses on providing relief from the symptoms and stress of a serious illness.  The goal is to improve quality of life for both the patient and the family. Values and goals of care important to patient and family were attempted to be elicited.  I created space and opportunity for patient and family to explore state of health prior to admission, thoughts, and feelings.  Patient reports she lives in the home alone with caregiver and neighbor support.  Endorses left-sided weakness and some gait instability requiring walker assistance when ambulating at times.  Katelyn Kennedy shares 6 months prior she was independent of all ADLs, driving, and ultimately healthy until cancer diagnosis.  States appetite is good with no concerns.  We discussed Her current illness and what it means in the larger context of Her on-going co-morbidities. Natural disease trajectory and expectations were discussed.  Katelyn Kennedy verbalized understanding of current illness and co-morbidities.  She is able to summarize her current treatment plan and past regimens for her metastatic lung cancer.  She acknowledges brain metastases with understanding of current radiation treatment plan.  She is emotional sharing her  initial presentation several months ago which identified initial brain mets.  I discussed the importance of continued conversation with family and their medical providers regarding overall plan of care and treatment options, ensuring decisions are within the context of the patients values and GOCs.  Questions and concerns were addressed.  Patient was encouraged to call with questions or concerns.  PMT will continue to support holistically as  needed.  Life Review: Katelyn Kennedy lives in the home alone with hired caregivers and neighbor supports.  Divorced with 2 daughters ([place] and [place]).  8 grandchildren.  States her 69 year old mother is still alive in a facility.   Objective:   Primary Diagnoses: Present on Admission:  Brain metastasis ([place])  Intraparenchymal hemorrhage of brain (HCC)  Vasogenic edema (HCC)  Midline shift of brain  Stage IV adenocarcinoma of lung (HCC)   Scheduled Meds:  amitriptyline  50 mg Oral QHS   atorvastatin  40 mg Oral QHS   Chlorhexidine Gluconate Cloth  6 each Topical Daily   dexamethasone (DECADRON) injection  4 mg Intravenous Q6H   famotidine  20 mg Oral Daily   feeding supplement  237 mL Oral BID BM   insulin aspart  0-15 Units Subcutaneous Q4H   levETIRAcetam  500 mg Oral BID   mouth rinse  15 mL Mouth Rinse BID   multivitamin with minerals  1 tablet Oral Daily   vitamin B-12  1,000 mcg Oral Daily    Continuous Infusions:  0.9 % NaCl with KCl 20 mEq / L 60 mL/hr at 03/08/21 1000   dexmedetomidine (PRECEDEX) IV infusion Stopped (03/07/21 1500)    PRN Meds: acetaminophen, docusate sodium, ondansetron (ZOFRAN) IV, polyethylene glycol  Allergies  Allergen Reactions   Codeine Nausea Only    Review of Systems  Neurological:  Positive for weakness.  Unless otherwise noted, a complete review of systems is negative.  Physical Exam General: NAD Cardiovascular: regular rate and rhythm Pulmonary:diminished bilaterally, room air Abdomen: soft, nontender, + bowel sounds Extremities: no edema, no joint deformities Skin: no rashes, warm and dry Neurological: AAOx3, mood appropriate  Vital Signs:  BP (!) 137/55 (BP Location: Right Arm)   Pulse 97   Temp 98 F (36.7 C) (Oral)   Resp (!) 22   Ht _0  (1.499 m)   Wt 60.9 kg   SpO2 97%   BMI 27.12 kg/m  Pain Scale: 0-10   Pain Score: 0-No pain  SpO2: SpO2: 97 % O2 Device:SpO2: 97 % O2 Flow Rate: .   IO:  Intake/output summary:  Intake/Output Summary (Last 24 hours) at 03/08/2021 1229 Last data filed at 03/08/2021 1000 Gross per 24 hour  Intake 1283.61 ml  Output 1900 ml  Net -616.39 ml    LBM: Last BM Date: 03/07/21 Baseline Weight: Weight: 61.2 kg Most recent weight: Weight: 60.9 kg      Palliative Assessment/Data: PPS 30-40%   Advanced Care Planning:   Primary Decision Maker: NEXT OF KIN  Code Status/Advance Care Planning: DNR  A discussion was had today regarding advanced directives. Concepts specific to code status, artifical feeding and hydration, continued IV antibiotics and rehospitalization was had.    Katelyn Kennedy states she does not have a documented advanced directive.  Education provided with encouragement to consider completion.  She is aware assistance can be provided while hospitalized to allow for completion.  Patient states she would be interested and is aware I will place the order with spiritual care to assist.  She states her sister [name]  Hasty would be her primary medical decision-maker given she is local however with ability to collaborate with both of her daughters for additional support as needed.  I discussed at length patient's current full CODE STATUS with consideration of her current illness and comorbidities.  She verbalized understanding expressing wishes to continue to treat the treatable however she would not wish for any heroic/life-sustaining measures but a natural death when that a time occurs.  Education provided with recommendations for DNR/DNI.  Patient verbalized understanding and is aware RN will place blue DNR bracelet to identify her wishes to staff.    Katelyn Kennedy is clear in her expressed wishes to continue to treat the treatable while hospitalized.  She is remaining hopeful for stability and wishes to be allowed every opportunity to continue to thrive for as long as she can.  Hospice and Palliative Care services outpatient were explained  and offered. Patient and family verbalized their understanding and awareness of both palliative and hospice's goals and philosophy of care.  Patient states she knows at some point hospice support will be most feasible however at this time given goals of continued treatment she is open to ongoing palliative support both inpatient and outpatient.  Patient aware I am available to follow-up with at [facility] center post discharge in collaboration with her oncology medical team.  She verbalized appreciation and request for continued support.    Assessment & Plan:   SUMMARY OF RECOMMENDATIONS   DNR/DNI-as requested and confirmed by patient Continue with current plan of care, patient is clear and expressed wishes to continue to treat the treatable allow her every opportunity to continue to thrive.  Has a clear understanding of her long-term prognosis. Patient is requesting ongoing inpatient and outpatient palliative support.  I am available to continue following patient at [facility] in collaboration with her oncology medical team at discharge.  She may also benefit from outpatient palliative support in the home. PMT will continue to support and follow as needed. Please call team line with urgent needs.  Palliative Prophylaxis:  Bowel Regimen and Frequent Pain Assessment  Additional Recommendations (Limitations, Scope, Preferences): DNR/DNI, continue to treat the treatable  Psycho-social/Spiritual:  Desire for further Chaplaincy support: yes Additional Recommendations:  Ongoing goals of care discussions  Prognosis:  Poor long-term prognosis in the setting of metastatic lung cancer with recurrent hemorrhagic brain mets.  Discharge Planning:  To Be Determined   Patient expressed understanding and was in agreement with this plan.   Time Total: 60 min.   Visit consisted of counseling and education dealing with the complex and emotionally intense issues of symptom management and  palliative care in the setting of serious and potentially life-threatening illness.Greater than 50%  of this time was spent counseling and coordinating care related to the above assessment and plan.  Signed by:  Alda Lea, AGPCNP-BC Palliative Medicine Team  Phone: [phone] Pager: [phone] Amion: Bjorn Pippin   Thank you for allowing the Palliative Medicine Team to assist in the care of this patient. Please utilize secure chat with additional questions, if there is no response within 30 minutes please call the above phone number. Palliative Medicine Team providers are available by phone from 7am to 5pm daily and can be reached through the team cell phone.  Should this patient require assistance outside of these hours, please call the patient's attending physician.

## 2021-03-08 NOTE — Plan of Care (Signed)

## 2021-03-08 NOTE — Progress Notes (Signed)
Patient arrived to unit. Made comfortable in bed with TV on.

## 2021-03-08 NOTE — Progress Notes (Signed)
NAME:  Katelyn Kennedy, MRN:  [id number], DOB:  1946-01-03, LOS: 3 ADMISSION DATE:  03/05/2021, CONSULTATION DATE:  11/23 REFERRING MD:  Dr. 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 She was admitted to [facility] in July on this year for resection of right posterior frontal dural based met and has since been off therapy. She was left with some residual left sided weakness as a result of tumor/surgery. She presented to [facility] ED 11/21 with complaints of worsening left sided weakness. Also with new onset delirium and visual hallucinations. Upon arrival to ED, CT head was  demonstrated progression of metastatic lung cancer with multiple hemorrhagic lesions and vasogenic edema. Notably she is on Eliquis for DVT. Neurosurgery has evaluated in the ED and does not see a need for any surgical intervention at this time. They have requested PCCM to admit to ICU.   Pertinent  Medical History   has a past medical history of Allergic rhinitis, Arthritis, Diabetes mellitus without complication ([place]), Dyspnea, Essential hypertension (04/11/2019), History of chicken pox, History of hiatal hernia, lung ca (dx'd 04/2017), Peripheral vascular disease ([place]), Pneumonia, PONV (postoperative nausea and vomiting), and Smoker.   Significant Hospital Events: Including procedures, antibiotic start and stop dates in addition to other pertinent events   11/23 admit to ICU MRI 11/24 1. Marked progression of intracranial metastatic disease since 11/27/2020. This is largely dural spread of tumor over the right cerebral hemisphere with some intraparenchymal components, most notably in the anterior right frontal lobe where there is extensive vasogenic edema and 1.3 cm of leftward midline shift. Possible small volume subarachnoid and subdural hemorrhage. 11/24 transferred to [facility] for brain RT venous  duplex 11/25 > neg LE  bilaterally    Scheduled Meds:  amitriptyline  50 mg Oral QHS   atorvastatin  40 mg Oral QHS   Chlorhexidine Gluconate Cloth  6 each Topical Daily   dexamethasone (DECADRON) injection  4 mg Intravenous Q6H   famotidine  20 mg Oral Daily   feeding supplement  237 mL Oral BID BM   insulin aspart  0-15 Units Subcutaneous Q4H   levETIRAcetam  500 mg Oral BID   mouth rinse  15 mL Mouth Rinse BID   multivitamin with minerals  1 tablet Oral Daily   vitamin B-12  1,000 mcg Oral Daily   Continuous Infusions:  0.9 % NaCl with KCl 20 mEq / L 60 mL/hr at 03/08/21 0700   dexmedetomidine (PRECEDEX) IV infusion Stopped (03/07/21 1500)   PRN Meds:.acetaminophen, docusate sodium, ondansetron (ZOFRAN) IV, polyethylene glycol  Interim History / Subjective:  No ha or nausea/ getting up with assistance for [facility]  Objective   Blood pressure (!) 103/45, pulse 87, temperature 98 F (36.7 C), temperature source Oral, resp. rate 13, height '4\' 11"'  (1.499 m), weight 60.9 kg, SpO2 95 %.        Intake/Output Summary (Last 24 hours) at 03/08/2021 0853 Last data filed at 03/08/2021 0700 Gross per 24 hour  Intake 1103.61 ml  Output 1200 ml  Net -96.39 ml   Filed Weights   03/06/21 2023 03/07/21 0251 03/08/21 0355  Weight: 61 kg 61 kg 60.9 kg   Physical Exam: Tmax 98.9  General appearance:   elderly wf lying on L side comfortable flat   At Rest 02 sats  95% on RA  No jvd  Oropharynx clear,  mucosa nl Neck supple Lungs clear bilaterally RRR no s3 or or sign murmur Abd soft/ nl excursion  Extr warm with no edema or clubbing noted Neuro  Sensorium intact,  no apparent motor deficits    Resolved Hospital Problem list     Assessment & Plan:   Acute metabolic encephalopathy  >>> resolved as of am 11/26 > ok for telemetry/ triad transfer as of 11/27 am  -Restraints as needed  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 CT this admission with multiple hemorrhagic lesions   IPH 2/2 metastatic brain lesions, not a candidate for neurosurgery - Neuro checks q4h - Continue Keppra - Decadron 4 every 6 h IV  - Kcentra given in ED to for Eliquis reversal. Continue holding  eliquis.   - Keeping  SBP < 154mHg  DM: managed by diet at home - SSI, will likely develop steroid induced hyperglycemia.   HTN HLD - continue home atorvastatin   DVT noted 12/2020 - holding Eliquis  >>>   repeated venous duplex 11/25 > neg LE  bilaterally   Best Practice (right click and "Reselect all SmartList Selections" daily)   Diet/type: Regular consistency (see orders) DVT prophylaxis: not indicated GI prophylaxis: N/A Lines: N/A Foley:  N/A Code Status:  full code Last date of multidisciplinary goals of care discussion current plan is for whole brain RT   Ok to transfer to telemetry 11/26 and to Triad service effective am 11/27   MChristinia Gully MD Pulmonary and CCharltonCell [phone]  After 7:00 pm call Elink  [phone]

## 2021-03-09 DIAGNOSIS — C3490 Malignant neoplasm of unspecified part of unspecified bronchus or lung: Secondary | ICD-10-CM | POA: Diagnosis not present

## 2021-03-09 DIAGNOSIS — I619 Nontraumatic intracerebral hemorrhage, unspecified: Secondary | ICD-10-CM | POA: Diagnosis not present

## 2021-03-09 DIAGNOSIS — I10 Essential (primary) hypertension: Secondary | ICD-10-CM

## 2021-03-09 DIAGNOSIS — I160 Hypertensive urgency: Secondary | ICD-10-CM | POA: Diagnosis present

## 2021-03-09 DIAGNOSIS — C7931 Secondary malignant neoplasm of brain: Secondary | ICD-10-CM | POA: Diagnosis not present

## 2021-03-09 LAB — GLUCOSE, CAPILLARY
Glucose-Capillary: 129 mg/dL — ABNORMAL HIGH (ref 70–99)
Glucose-Capillary: 155 mg/dL — ABNORMAL HIGH (ref 70–99)
Glucose-Capillary: 115 mg/dL — ABNORMAL HIGH (ref 70–99)
Glucose-Capillary: 174 mg/dL — ABNORMAL HIGH (ref 70–99)
Glucose-Capillary: 173 mg/dL — ABNORMAL HIGH (ref 70–99)
Glucose-Capillary: 227 mg/dL — ABNORMAL HIGH (ref 70–99)
Glucose-Capillary: 159 mg/dL — ABNORMAL HIGH (ref 70–99)

## 2021-03-09 LAB — CBC WITH DIFFERENTIAL/PLATELET
Abs Immature Granulocytes: 0.09 10*3/uL — ABNORMAL HIGH (ref 0.00–0.07)
Basophils Absolute: 0 10*3/uL (ref 0.0–0.1)
Basophils Relative: 0 %
Eosinophils Absolute: 0 10*3/uL (ref 0.0–0.5)
Eosinophils Relative: 0 %
HCT: 36 % (ref 36.0–46.0)
Hemoglobin: 11.8 g/dL — ABNORMAL LOW (ref 12.0–15.0)
Immature Granulocytes: 1 %
Lymphocytes Relative: 10 %
Lymphs Abs: 1 10*3/uL (ref 0.7–4.0)
MCH: 31.1 pg (ref 26.0–34.0)
MCHC: 32.8 g/dL (ref 30.0–36.0)
MCV: 94.7 fL (ref 80.0–100.0)
Monocytes Absolute: 0.4 10*3/uL (ref 0.1–1.0)
Monocytes Relative: 4 %
Neutro Abs: 8.6 10*3/uL — ABNORMAL HIGH (ref 1.7–7.7)
Neutrophils Relative %: 85 %
Platelets: 276 10*3/uL (ref 150–400)
RBC: 3.8 MIL/uL — ABNORMAL LOW (ref 3.87–5.11)
RDW: 12.6 % (ref 11.5–15.5)
WBC: 10.1 10*3/uL (ref 4.0–10.5)
nRBC: 0 % (ref 0.0–0.2)

## 2021-03-09 LAB — BASIC METABOLIC PANEL
Anion gap: 4 — ABNORMAL LOW (ref 5–15)
BUN: 23 mg/dL (ref 8–23)
CO2: 24 mmol/L (ref 22–32)
Calcium: 8.5 mg/dL — ABNORMAL LOW (ref 8.9–10.3)
Chloride: 109 mmol/L (ref 98–111)
Creatinine, Ser: 0.63 mg/dL (ref 0.44–1.00)
GFR, Estimated: >60 mL/min (ref >60)
Glucose, Bld: 163 mg/dL — ABNORMAL HIGH (ref 70–99)
Potassium: 4 mmol/L (ref 3.5–5.1)
Sodium: 137 mmol/L (ref 135–145)

## 2021-03-09 LAB — MAGNESIUM: Magnesium: 1.9 mg/dL (ref 1.7–2.4)

## 2021-03-09 MED ORDER — GLUCERNA SHAKE PO LIQD
237.0000 mL | ORAL | Status: DC
  Filled 2021-03-09 (×6): qty 237

## 2021-03-09 MED ORDER — ENSURE ENLIVE PO LIQD
237.0000 mL | ORAL | Status: DC
  Administered 2021-03-10 – 2021-03-11 (×2): 237 mL via ORAL

## 2021-03-09 MED ORDER — INSULIN ASPART 100 UNIT/ML IJ SOLN
0.0000 [IU] | Freq: Three times a day (TID) | INTRAMUSCULAR | Status: DC
Start: 2021-03-10 — End: 2021-03-19
  Administered 2021-03-10 – 2021-03-11 (×3): 3 [IU] via SUBCUTANEOUS
  Administered 2021-03-11 – 2021-03-12 (×3): 5 [IU] via SUBCUTANEOUS
  Administered 2021-03-12 (×2): 3 [IU] via SUBCUTANEOUS
  Administered 2021-03-13 (×2): 8 [IU] via SUBCUTANEOUS
  Administered 2021-03-13: 3 [IU] via SUBCUTANEOUS
  Administered 2021-03-14: 2 [IU] via SUBCUTANEOUS
  Administered 2021-03-14: 5 [IU] via SUBCUTANEOUS
  Administered 2021-03-14: 15 [IU] via SUBCUTANEOUS
  Administered 2021-03-15: 3 [IU] via SUBCUTANEOUS
  Administered 2021-03-15: 5 [IU] via SUBCUTANEOUS
  Administered 2021-03-15 – 2021-03-16 (×2): 3 [IU] via SUBCUTANEOUS
  Administered 2021-03-16: 13:00:00 15 [IU] via SUBCUTANEOUS
  Administered 2021-03-17: 3 [IU] via SUBCUTANEOUS
  Administered 2021-03-17: 5 [IU] via SUBCUTANEOUS
  Administered 2021-03-17: 3 [IU] via SUBCUTANEOUS
  Administered 2021-03-18: 5 [IU] via SUBCUTANEOUS
  Administered 2021-03-18: 3 [IU] via SUBCUTANEOUS
  Administered 2021-03-18: 2 [IU] via SUBCUTANEOUS
  Administered 2021-03-19: 5 [IU] via SUBCUTANEOUS
  Administered 2021-03-19: 2 [IU] via SUBCUTANEOUS

## 2021-03-09 NOTE — Progress Notes (Signed)
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 She was admitted to [facility] in July on this year for resection of right posterior frontal dural based met and has since been off therapy. She was left with some residual left sided weakness as a result of tumor/surgery. She presented to [facility] ED 11/21 with complaints of worsening left sided weakness. Also with new onset delirium and visual hallucinations. Upon arrival to ED, CT head was  demonstrated progression of metastatic lung cancer with multiple hemorrhagic lesions and vasogenic edema. Notably she is on Eliquis for DVT. Neurosurgery has evaluated in the ED and does not see a need for any surgical intervention at this time. They requested PCCM to admit to ICU.  -Patient admitted under PCCM, patient started on IV Decadron and Keppra with some clinical improvement.  Patient seen by radiation oncology and whole brain radiation started 03/07/2021.  Patient transferred to [facility] for ongoing radiation treatment. -Patient subsequently transferred to hospitalist service on 03/09/2021.     Assessment & Plan:   Principal Problem:   Intraparenchymal hemorrhage of brain (HCC) Active Problems:   Diabetes mellitus with no complication (HCC)   Brain metastasis (HCC)   Vasogenic edema (HCC)   Midline shift of brain   Chronic anticoagulation   Stage IV adenocarcinoma of lung (HCC)   Pressure injury of skin  #1 intraparenchymal hemorrhage of the brain secondary to metastatic brain lesions, POA -Patient had presented with worsening left-sided weakness, CT head done on admission with marked progression of metastatic lung disease with multiple new hemorrhagic metastatic lesions noted with significant right frontal vasogenic  edema resulting in left midline shift of approximately 0.8 cm. -MRI brain done with marked progression of intracranial metastatic disease, largely dural spread of tumor over the right cerebral hemisphere with some intraparenchymal components notably in the anterior right frontal lobe with extensive vasogenic edema 1.3 cm leftward midline shift, possible small volume subarachnoid and subdural hemorrhage. -Patient seen in consultation by neurosurgery who recommended close neuro monitoring, holding oral anticoagulation, Decadron 4 mg every 6 hours, Keppra, no surgical intervention at this time. -Status post Kcentra given in the ED for Eliquis reversal. -Continue to hold anticoagulation with Eliquis. -Patient seen by radiation oncology and patient started on whole brain radiation. -Oncology informed via epic of patient's admission. -Supportive care.  2.  Acute metabolic encephalopathy -Likely secondary to problem #1. -Improving clinically on Decadron and Keppra. -Patient started on whole brain radiation treatment.  3.  Hyperlipidemia -Statin.  4.  Stage IV non-small cell lung cancer -Status post chemoradiation to the lung, now with mets to the brain status postresection July with stereotactic radiosurgery in August. -CT head done with multiple hemorrhagic lesions and progression of mets. -Oncology informed via epic of patient's admission.  5.  DVT 12/2020 -Currently Eliquis on hold due to problem #1. -Repeat lower extremity Dopplers done negative for DVT bilaterally.  6.  Hypertension -Blood pressure stable, soft. -Continue to hold lisinopril.   DVT prophylaxis: SCDs Code Status: DNR Family Communication: Updated patient, sister and niece at bedside. Disposition:   Status is: Inpatient  Remains inpatient appropriate because: Severity of illness       Consultants:  Neurosurgery: Dr. Reatha Armour 03/05/2021 Palliative care: Jobe Gibbon  03/08/2021 Oncology informed via  epic of admission. Radiation oncology: Dr. Sondra Come 03/06/2021  Procedures:  CT head 03/05/2021 MRI brain 03/06/2021 Lower extremity Dopplers 03/07/2021   Significant Hospital Events: Including procedures, antibiotic start and stop dates in addition to other pertinent events   11/23 admit to ICU MRI 11/24 1. Marked progression of intracranial metastatic disease since 11/27/2020. This is largely dural spread of tumor over the right cerebral hemisphere with some intraparenchymal components, most notably in the anterior right frontal lobe where there is extensive vasogenic edema and 1.3 cm of leftward midline shift. Possible small volume subarachnoid and subdural hemorrhage. 11/24 transferred to [facility] for brain RT venous duplex 11/25 > neg LE  bilaterally       Antimicrobials:  None   Subjective: Sitting up in chair.  Family at bedside.  Denies any chest pain.  No shortness of breath.  Feels left-sided weakness is improving.  Alert oriented to self place and time.  Objective: Vitals:   03/08/21 2015 03/09/21 0001 03/09/21 0348 03/09/21 0500  BP: (!) 118/59 (!) 118/53 107/60   Pulse: 96 96 91   Resp: 18 (!) 22 18   Temp: 98.2 F (36.8 C) (!) 97.5 F (36.4 C) 98.2 F (36.8 C)   TempSrc: Oral Oral Oral   SpO2: 96% 94% 96%   Weight:    63.2 kg  Height:        Intake/Output Summary (Last 24 hours) at 03/09/2021 1409 Last data filed at 03/09/2021 1030 Gross per 24 hour  Intake 1083.01 ml  Output --  Net 1083.01 ml   Filed Weights   03/07/21 0251 03/08/21 0355 03/09/21 0500  Weight: 61 kg 60.9 kg 63.2 kg    Examination:  General exam: Appears calm and comfortable  Respiratory system: Clear to auscultation. Respiratory effort normal. Cardiovascular system: S1 & S2 heard, RRR. No JVD, murmurs, rubs, gallops or clicks. No pedal edema. Gastrointestinal system: Abdomen is nondistended, soft and nontender. No organomegaly or masses felt. Normal bowel sounds  heard. Central nervous system: Alert and oriented. No focal neurological deficits. Extremities: Left upper extremity 3-4/5 strength, right upper extremity 5/5 strength.  Skin: No rashes, lesions or ulcers Psychiatry: Judgement and insight appear normal. Mood & affect appropriate.     Data Reviewed: I have personally reviewed following labs and imaging studies  CBC: Recent Labs  Lab 03/05/21 1645 03/06/21 0230 03/08/21 0243 03/09/21 1005  WBC 5.7 6.5 13.9* 10.1  NEUTROABS 3.4  --   --  8.6*  HGB 11.2* 12.0 11.5* 11.8*  HCT 34.0* 35.6* 35.7* 36.0  MCV 93.9 91.8 94.9 94.7  PLT 256 283 300 466    Basic Metabolic Panel: Recent Labs  Lab 03/05/21 1645 03/06/21 0230 03/08/21 0243 03/09/21 1005  NA 142 141 142 137  K 3.6 3.9 4.0 4.0  CL 108 109 110 109  CO2 _0 GLUCOSE 119* 199* 134* 163*  BUN 5* 7* 18 23  CREATININE 0.70 0.70 0.62 0.63  CALCIUM 9.4 9.3 9.3 8.5*  MG 1.7 2.3  --  1.9    GFR: Estimated Creatinine Clearance: 47.6 mL/min (by C-G formula based on SCr of 0.63 mg/dL).  Liver Function Tests: Recent Labs  Lab 03/05/21 1645  AST 25  ALT 17  ALKPHOS 117  BILITOT 0.7  PROT 5.8*  ALBUMIN 3.3*    CBG: Recent Labs  Lab 03/08/21 2128 03/08/21 2357 03/09/21 0350 03/09/21 0746 03/09/21 1247  GLUCAP 193* 188* 115* 174* 173*  Recent Results (from the past 240 hour(s))  Resp Panel by RT-PCR (Flu A&B, Covid) Nasopharyngeal Swab     Status: None   Collection Time: 03/05/21  6:03 PM   Specimen: Nasopharyngeal Swab; Nasopharyngeal(NP) swabs in vial transport medium  Result Value Ref Range Status   SARS Coronavirus 2 by RT PCR NEGATIVE NEGATIVE Final    Comment: (NOTE) SARS-CoV-2 target nucleic acids are NOT DETECTED.  The SARS-CoV-2 RNA is generally detectable in upper respiratory specimens during the acute phase of infection. The lowest concentration of SARS-CoV-2 viral copies this assay can detect is 138 copies/mL. A negative result does  not preclude SARS-Cov-2 infection and should not be used as the sole basis for treatment or other patient management decisions. A negative result may occur with  improper specimen collection/handling, submission of specimen other than nasopharyngeal swab, presence of viral mutation(s) within the areas targeted by this assay, and inadequate number of viral copies(<138 copies/mL). A negative result must be combined with clinical observations, patient history, and epidemiological information. The expected result is Negative.  Fact Sheet for Patients:  EntrepreneurPulse.com.au  Fact Sheet for Healthcare Providers:  IncredibleEmployment.be  This test is no t yet approved or cleared by the [place] FDA and  has been authorized for detection and/or diagnosis of SARS-CoV-2 by FDA under an Emergency Use Authorization (EUA). This EUA will remain  in effect (meaning this test can be used) for the duration of the COVID-19 declaration under Section 564(b)(1) of the Act, 21 U.S.C.section 360bbb-3(b)(1), unless the authorization is terminated  or revoked sooner.       Influenza A by PCR NEGATIVE NEGATIVE Final   Influenza B by PCR NEGATIVE NEGATIVE Final    Comment: (NOTE) The Xpert Xpress SARS-CoV-2/FLU/RSV plus assay is intended as an aid in the diagnosis of influenza from Nasopharyngeal swab specimens and should not be used as a sole basis for treatment. Nasal washings and aspirates are unacceptable for Xpert Xpress SARS-CoV-2/FLU/RSV testing.  Fact Sheet for Patients: EntrepreneurPulse.com.au  Fact Sheet for Healthcare Providers: IncredibleEmployment.be  This test is not yet approved or cleared by the [place] FDA and has been authorized for detection and/or diagnosis of SARS-CoV-2 by FDA under an Emergency Use Authorization (EUA). This EUA will remain in effect (meaning this test can be used) for the  duration of the COVID-19 declaration under Section 564(b)(1) of the Act, 21 U.S.C. section 360bbb-3(b)(1), unless the authorization is terminated or revoked.  Performed at [facility], [place] [address] [place], [place] [address]   MRSA Next Gen by PCR, Nasal     Status: None   Collection Time: 03/05/21 10:25 PM   Specimen: Nasal Mucosa; Nasal Swab  Result Value Ref Range Status   MRSA by PCR Next Gen NOT DETECTED NOT DETECTED Final    Comment: (NOTE) The GeneXpert MRSA Assay (FDA approved for NASAL specimens only), is one component of a comprehensive MRSA colonization surveillance program. It is not intended to diagnose MRSA infection nor to guide or monitor treatment for MRSA infections. Test performance is not FDA approved in patients less than 55 years old. Performed at [facility], [place] [address], [place], [place], [place] [address]          Radiology Studies: No results found.      Scheduled Meds:  amitriptyline  50 mg Oral QHS   atorvastatin  40 mg Oral QHS   Chlorhexidine Gluconate Cloth  6 each Topical Daily   dexamethasone (DECADRON) injection  4  mg Intravenous Q6H   famotidine  20 mg Oral Daily   [START ON 03/10/2021] feeding supplement  237 mL Oral Q24H   feeding supplement (GLUCERNA SHAKE)  237 mL Oral Q24H   insulin aspart  0-15 Units Subcutaneous Q4H   levETIRAcetam  500 mg Oral BID   mouth rinse  15 mL Mouth Rinse BID   multivitamin with minerals  1 tablet Oral Daily   vitamin B-12  1,000 mcg Oral Daily   Continuous Infusions:  0.9 % NaCl with KCl 20 mEq / L 60 mL/hr at 03/09/21 0759   dexmedetomidine (PRECEDEX) IV infusion Stopped (03/07/21 1500)     LOS: 4 days    Time spent: 40 minutes    Irine Seal, MD [facility]   To contact the attending provider between 7A-7P or the covering provider during after hours 7P-7A, please log into the web site [email or web address] and access using universal [place] password for that web  site. If you do not have the password, please call the hospital operator.  03/09/2021, 2:09 PM

## 2021-03-09 NOTE — Progress Notes (Signed)
Initial Nutrition Assessment RD working remotely.  DOCUMENTATION CODES:   Not applicable  INTERVENTION:  - continue Ensure Plus High Protein but will decrease from BID to once/day, each supplement provides 350 kcal and 20 grams of protein. - will order Glucerna once/day, each supplement provides 220 kcal and 10 grams of protein. - will order [employer] with each breakfast meal. - recommend liberalize diet from Heart Healthy/Carb Modified to Regular.    NUTRITION DIAGNOSIS:   Increased nutrient needs related to acute illness, cancer and cancer related treatments, catabolic illness as evidenced by estimated needs.  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 She was admitted to [facility] in 10/2020 for resection of R posterior frontal dural-based met and has been off of cancer treatment since that time. She has ongoing L-sided weakness following resection. She presented to the ED on 11/21 with worsening L-sided weakness, new onset delirium and visual hallucinations. In the ED, CT head showed progression of metastatic lung cancer with multiple hemorrhagic lesions and vasogenic edema. Neurosurgery evaluated in the ED and did not see a need for any surgical intervention at that time.  Patient admitted on 11/23 and the only documented meal completion percentage was 100% of lunch on 11/24.   Ensure was ordered BID on 11/25 and she has been accepting this supplement 50% of the time offered.   She has not been assessed by a [place] RD in the past.   Weight today is 139 lb, weight 11/24-11/26 was 134 lb, and weight on 11/23 was 133 lb. Weight had been stable (134-138  lb) from 10/15/20-12/26/20 and then she was not weighed again until this admission.   Mild pitting edema to BLE documented in the edema section of flow sheet.   Palliative Care is following and last met with patient yesterday. She is DNR/DNI with plan to treat the treatable during this hospitalization.   Labs reviewed; CBGs: 115 and 174 mg/dl.  Medications reviewed; 20 mg oral pepcid/day, sliding scale novolog, 1 tablet multivitamin with minerals/day, 1000 mcg oral cyanocobalamin/day.   IVF; NS-20 mEq KCl @ 60 ml/hr.    NUTRITION - FOCUSED PHYSICAL EXAM:  RD working remotely.  Diet Order:   Diet Order             Diet heart healthy/carb modified Room service appropriate? Yes; Fluid consistency: Thin  Diet effective now                   EDUCATION NEEDS:   Not appropriate for education at this time  Skin:  Skin Assessment: Skin Integrity Issues: Skin Integrity Issues:: Stage I Stage I: coccyx  Last BM:  11/25 (type 3)  Height:   Ht Readings from Last 1 Encounters:  03/06/21 '4\' 11"'  (1.499 m)    Weight:   Wt Readings from Last 1 Encounters:  03/09/21 63.2 kg     Estimated Nutritional Needs:  Kcal:  1700-1900 kcal Protein:  90-105 grams Fluid:  >/= 1.7 L/day      Jarome Matin, MS, RD, LDN, CNSC Inpatient Clinical Dietitian RD pager # available in AMION  After hours/weekend pager # available in [facility]

## 2021-03-09 NOTE — Evaluation (Signed)
Physical Therapy Evaluation Patient Details Name: Katelyn Kennedy MRN: [id number] DOB: 11-15-1945 Today's Date: 03/09/2021  History of Present Illness  75 year old female adm through [facility] ED 11/21 with complaints of worsening left sided weakness, new onset delirium and visual hallucinations. CT head demonstrated progression of metastatic lung cancer with multiple hemorrhagic lesions and vasogenic edema. Notably she is on Eliquis for DVT. Neurosurgery consulted and did not recommend surgical intervention at this time.  PMH:HTN, arthritis, DM,. 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  Clinical Impression  Pt admitted with above diagnosis.  PT reports independence/mod I at her baseline. Today she demonstrates cognitive deficits, higher level balance deficits and decr endurance. May need SNF vs home with HHPT pending LOS, course of tx and available home support.    Pt currently with functional limitations due to the deficits listed below (see PT Problem List). Pt will benefit from skilled PT to increase their independence and safety with mobility to allow discharge to the venue listed below.          Recommendations for follow up therapy are one component of a multi-disciplinary discharge planning process, led by the attending physician.  Recommendations may be updated based on patient status, additional functional criteria and insurance authorization.  Follow Up Recommendations Skilled nursing-short term rehab (<3 hours/day)    Assistance Recommended at Discharge Frequent or constant Supervision/Assistance  Functional Status Assessment Patient has had a recent decline in their functional status and demonstrates the ability to make significant improvements in function in a reasonable and predictable amount of time.  Equipment Recommendations  None recommended  by PT    Recommendations for Other Services       Precautions / Restrictions Precautions Precautions: Fall Restrictions Weight Bearing Restrictions: No      Mobility  Bed Mobility               General bed mobility comments: NT- pt in chair    Transfers Overall transfer level: Needs assistance Equipment used: Rolling walker (2 wheels) Transfers: Sit to/from Stand Sit to Stand: Min guard           General transfer comment: from chair and toilet, min/guard for safety, verbal and tactile cues for hand placement    Ambulation/Gait Ambulation/Gait assistance: Min assist Gait Distance (Feet): 15 Feet (x2) Assistive device: Rolling walker (2 wheels) Gait Pattern/deviations: Step-through pattern;Decreased stride length;Trunk flexed       General Gait Details: multi-modal cues for sequence, RW position. assist to maneuver RW, directional and visual and verbal cues to move toward bathroom (after pt requested to go to bathroom she began amb toward door to room)  Stairs            Wheelchair Mobility    Modified Rankin (Stroke Patients Only)       Balance Overall balance assessment: Needs assistance Sitting-balance support: No upper extremity supported;Feet supported;Single extremity supported Sitting balance-Leahy Scale: Good     Standing balance support: During functional activity;No upper extremity supported Standing balance-Leahy Scale: Fair Standing balance comment: pt is able to perform her own pericare, able to wash hands without UE support                             Pertinent Vitals/Pain Pain Assessment: No/denies pain    Home Living  Family/patient expects to be discharged to:: Skilled nursing facility Living Arrangements: Alone Available Help at Discharge: Available PRN/intermittently Type of Home: Apartment Home Access: Stairs to enter Entrance Stairs-Rails: None Entrance Stairs-Number of Steps: 1   Home Layout: One  level Home Equipment: Grab bars - tub/shower;Rollator (4 wheels);Shower seat Additional Comments: pt reports after her surgery in July her friend (who is a caregiver) stayed with her most days, niece at night. she also has a sister and sister-in-law that assisted. prior to this admission she was back to being independent/mod I per her report, and no longer had caregiver come. Pt's niece is avilable nights only but works days.    Prior Function Prior Level of Function : Driving             Mobility Comments: prior to adm pt reports amb with borrowed rollator, denies falls, drives short distances in day time but reports that she tries to avoid it. ADLs Comments: Pt with cognitive impairments and having difficulty with time lines and giving PLOF. For example, pt finished describing home and "throwing laundry down the stairs to the laundry room." Pt was reminded that she said she lives in a one story apartment. Pt stated, "Oh, I got confused and was thinking of my old house!"  Pt's responses are not consistenyl congruent with the questions asked and pt needs incerased cues/time/redirection for giving informaiton.     Hand Dominance   Dominant Hand: Right    Extremity/Trunk Assessment        Lower Extremity Assessment Lower Extremity Assessment: Overall WFL for tasks assessed (strength grossly WFL, grossly symmetrical)       Communication   Communication: No difficulties  Cognition Arousal/Alertness: Awake/alert Behavior During Therapy: WFL for tasks assessed/performed Overall Cognitive Status: Impaired/Different from baseline Area of Impairment: Attention;Following commands;Problem solving;Safety/judgement                   Current Attention Level: Sustained   Following Commands: Follows one step commands with increased time;Follows multi-step commands with increased time     Problem Solving: Difficulty sequencing;Requires verbal cues;Requires tactile cues General  Comments: perseverates at times requiring cues to complete basic tasks (peri-care, hand washing, sit to stand transfers).        General Comments      Exercises     Assessment/Plan    PT Assessment Patient needs continued PT services  PT Problem List Decreased mobility;Decreased safety awareness;Decreased knowledge of precautions;Decreased activity tolerance;Decreased balance;Decreased knowledge of use of DME;Decreased cognition;Decreased coordination       PT Treatment Interventions DME instruction;Therapeutic activities;Functional mobility training;Balance training;Gait training;Therapeutic exercise;Patient/family education;Neuromuscular re-education    PT Goals (Current goals can be found in the Care Plan section)  Acute Rehab PT Goals Patient Stated Goal: to get better, be as independent as possible PT Goal Formulation: With patient Time For Goal Achievement: 03/23/21 Potential to Achieve Goals: Good    Frequency Min 3X/week   Barriers to discharge        Co-evaluation               AM-PAC PT "6 Clicks" Mobility  Outcome Measure Help needed turning from your back to your side while in a flat bed without using bedrails?: A Little Help needed moving from lying on your back to sitting on the side of a flat bed without using bedrails?: A Little Help needed moving to and from a bed to a chair (including a wheelchair)?: A Little Help needed standing up from  a chair using your arms (e.g., wheelchair or bedside chair)?: A Lot Help needed to walk in hospital room?: A Lot Help needed climbing 3-5 steps with a railing? : A Lot 6 Click Score: 15    End of Session   Activity Tolerance: Patient tolerated treatment well Patient left: in chair;with call bell/phone within reach;with chair alarm set;Other (comment) (with OT)   PT Visit Diagnosis: Other abnormalities of gait and mobility (R26.89);Other symptoms and signs involving the nervous system (R29.898)    Time:  4179-1995 PT Time Calculation (min) (ACUTE ONLY): 28 min   Charges:   PT Evaluation $PT Eval Low Complexity: 1 Low PT Treatments $Gait Training: 8-22 mins        Baxter Flattery, PT  Acute Rehab Dept ([place]) [phone] Pager [phone]  03/09/2021   [facility] 03/09/2021, 3:54 PM

## 2021-03-09 NOTE — Evaluation (Signed)
Occupational Therapy Evaluation Patient Details Name: Katelyn Kennedy MRN: [id number] DOB: November 18, 1945 Today's Date: 03/09/2021   History of Present Illness 75 year old female adm through [facility] ED 11/21 with complaints of worsening left sided weakness, new onset delirium and visual hallucinations. CT head demonstrated progression of metastatic lung cancer with multiple hemorrhagic lesions and vasogenic edema. Notably she is on Eliquis for DVT. Neurosurgery consulted and did not recommend surgical intervention at this time.  PMH:HTN, arthritis, DM,. 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   Clinical Impression   Patient is currently requiring assistance with ADLs including minimal assist with toileting, with LE dressing, and with bathing, and Min guard assist with sit to stands from recliner and marching in place with RW.  Current level of function is below patient's typical baseline.  During this evaluation, patient was limited by generalized weakness and LT sided impaired gross and fine motor corrdination, impaired activity tolerance, and confusion with difficulty providing accurate responses to questions related to prior level and home, all of which has the potential to impact patient's safety and independence during functional mobility, as well as performance for ADLs.  Patient lives alone with family members and a friend, who are able to provide close to 24/7 supervision and assistance, but this may require confirmation from family.  Patient demonstrates good rehab potential, and should benefit from continued skilled occupational therapy services while in acute care to maximize safety, independence and quality of life at home.  Continued occupational therapy services in a SNF setting prior to return home is recommended.  ?   Recommendations for follow up therapy  are one component of a multi-disciplinary discharge planning process, led by the attending physician.  Recommendations may be updated based on patient status, additional functional criteria and insurance authorization.   Follow Up Recommendations  Skilled nursing-short term rehab (<3 hours/day)    Assistance Recommended at Discharge Frequent or constant Supervision/Assistance  Functional Status Assessment  Patient has had a recent decline in their functional status and demonstrates the ability to make significant improvements in function in a reasonable and predictable amount of time.  Equipment Recommendations  Other (comment) (Will defer to post-acute recommendations.)    Recommendations for Other Services Speech consult     Precautions / Restrictions Precautions Precautions: Fall Restrictions Weight Bearing Restrictions: No      Mobility Bed Mobility               General bed mobility comments: NT- pt in chair    Transfers Overall transfer level: Needs assistance Equipment used: Rolling walker (2 wheels) Transfers: Sit to/from Stand Sit to Stand: Min guard           General transfer comment: Pt stood from recliner to [place] with Min guard. Pt stood and marched in place took 2 steps forward and back and returned to recliner with Min guard for safe descent.      Balance Overall balance assessment: Needs assistance Sitting-balance support: Feet supported;Single extremity supported Sitting balance-Leahy Scale: Fair Sitting balance - Comments: Occasional LT sided LOB when back unsupported and pt sitting edge of chair   Standing balance support: During functional activity;No upper extremity supported Standing balance-Leahy Scale: Fair Standing balance comment: pt is able to perform her own pericare, able to wash hands without UE support  ADL either performed or assessed with clinical judgement   ADL Overall ADL's : Needs  assistance/impaired Eating/Feeding: Supervision/ safety;Set up   Grooming: Wash/dry hands;Standing;Min guard;Cueing for sequencing;Cueing for safety   Upper Body Bathing: Sitting;Min guard;Cueing for sequencing   Lower Body Bathing: Minimal assistance;Sitting/lateral leans;Sit to/from stand   Upper Body Dressing : Min guard;Sitting   Lower Body Dressing: Minimal assistance Lower Body Dressing Details (indicate cue type and reason): Pt able to perform doffing and donning socks at recliner level with increased time and Min As to postion sock correctly after pt attempted for several minutes but unable.   Toilet Transfer Details (indicate cue type and reason): Please see Mobility section. Pt denied need to void. Toileting- Clothing Manipulation and Hygiene: Minimal assistance Toileting - Clothing Manipulation Details (indicate cue type and reason): Based on general assessment.     Functional mobility during ADLs: Min guard;Minimal assistance;Rolling walker (2 wheels)       Vision Baseline Vision/History: 1 Wears glasses Ability to See in Adequate Light: 1 Impaired Additional Comments: Intact smooth pursuits, pt denied vision deficits. Pt with mild LT sided inattention.     Perception Perception Perception: Impaired Spatial Orientation: see above   Praxis Praxis Praxis: Impaired Praxis Impairment Details: Motor planning;Ideomotor;Perseveration    Pertinent Vitals/Pain Pain Assessment: No/denies pain     Hand Dominance Right   Extremity/Trunk Assessment Upper Extremity Assessment Upper Extremity Assessment: LUE deficits/detail LUE Deficits / Details: MMT and ROM intact but very slow compared to RUE. Pt reports intermittent LT hand numbness and shows sitting loss of balance to LT side. LUE Sensation: decreased light touch LUE Coordination: decreased fine motor;decreased gross motor (delayed rate and decreased accuracy. Some dysmetria with finger to thumb.)   Lower Extremity  Assessment Lower Extremity Assessment: Defer to PT evaluation;Overall [facility] for tasks assessed   Cervical / Trunk Assessment Cervical / Trunk Assessment: Normal   Communication Communication Communication: No difficulties   Cognition Arousal/Alertness: Awake/alert Behavior During Therapy: WFL for tasks assessed/performed Overall Cognitive Status: Impaired/Different from baseline Area of Impairment: Attention;Following commands;Problem solving;Safety/judgement;Memory                   Current Attention Level: Sustained Memory: Decreased short-term memory Following Commands: Follows one step commands with increased time;Follows multi-step commands with increased time;Follows multi-step commands inconsistently   Awareness: Emergent Problem Solving: Difficulty sequencing;Requires verbal cues;Requires tactile cues;Slow processing General Comments: perseverates at times requiring cues to attend to task or topic. Will forget repetative tasks such as touching finger to nose and stop midway, requiring additonal cues.     General Comments       Exercises     Shoulder Instructions      Home Living Family/patient expects to be discharged to:: Skilled nursing facility Living Arrangements: Alone Available Help at Discharge: Available PRN/intermittently Type of Home: Apartment Home Access: Stairs to enter Entrance Stairs-Number of Steps: 1 Entrance Stairs-Rails: None Home Layout: One level     Bathroom Shower/Tub: Teacher, early years/pre:  (Comfort height)     Home Equipment: Grab bars - tub/shower;Rollator (4 wheels);Shower seat   Additional Comments: pt reports after her surgery in July her friend (who is a caregiver) stayed with her most days, niece at night. she also has a sister and sister-in-law that assisted. prior to this admission she was back to being independent/mod I per her report, and no longer had caregiver come. Pt's niece is avilable nights only but  works days.  Prior Functioning/Environment Prior Level of Function : Driving             Mobility Comments: prior to adm pt reports amb with borrowed rollator, denies falls, drives short distances in day time but reports that she tries to avoid it. ADLs Comments: Pt with cognitive impairments and having difficulty with time lines and giving PLOF. For example, pt finished describing home and "throwing laundry down the stairs to the laundry room." Pt was reminded that she said she lives in a one story apartment. Pt stated, "Oh, I got confused and was thinking of my old house!"  Pt's responses are not consistenyl congruent with the questions asked and pt needs incerased cues/time/redirection for giving informaiton.        OT Problem List: Decreased strength;Decreased coordination;Cardiopulmonary status limiting activity;Decreased cognition;Impaired sensation;Decreased activity tolerance;Decreased safety awareness;Impaired balance (sitting and/or standing);Decreased knowledge of use of DME or AE;Impaired vision/perception;Decreased knowledge of precautions;Impaired UE functional use      OT Treatment/Interventions: Self-care/ADL training;Therapeutic exercise;Therapeutic activities;Neuromuscular education;Cognitive remediation/compensation;Energy conservation;Visual/perceptual remediation/compensation;Patient/family education;DME and/or AE instruction;Balance training    OT Goals(Current goals can be found in the care plan section) Acute Rehab OT Goals Patient Stated Goal: Increase safety with in and out of shower. OT Goal Formulation: With patient Time For Goal Achievement: 03/23/21 Potential to Achieve Goals: Good ADL Goals Pt Will Perform Lower Body Dressing: with modified independence;sitting/lateral leans;sit to/from stand Pt Will Transfer to Toilet: with modified independence;ambulating Pt Will Perform Toileting - Clothing Manipulation and hygiene: with modified  independence;sitting/lateral leans;sit to/from stand Pt Will Perform Tub/Shower Transfer: Tub transfer;shower seat;with supervision;grab bars (and verbalize understanding to grab bar placement recommendations.) Additional ADL Goal #1: Patient will tolerate BUE home exercise program 10-15 reps within pain-free ranges, in an unsupported seated position, in order to improve upper body strength, endurance and core stability needed to complete home ADLs.   Pt will perform LT UE coordination exercises with supervision-Min assist and LT strengthening exercises as tolerated. Additional ADL Goal #2: Pt will demonstrate improved cognition by consistenyl following 2-3 step commands without supplimental cues and attending to one task or topic at a time for 1 full minute.  OT Frequency: Min 2X/week   Barriers to D/C: Decreased caregiver support          Co-evaluation              AM-PAC OT "6 Clicks" Daily Activity     Outcome Measure Help from another person eating meals?: A Little Help from another person taking care of personal grooming?: A Little Help from another person toileting, which includes using toliet, bedpan, or urinal?: A Little Help from another person bathing (including washing, rinsing, drying)?: A Little Help from another person to put on and taking off regular upper body clothing?: A Little Help from another person to put on and taking off regular lower body clothing?: A Little 6 Click Score: 18   End of Session Equipment Utilized During Treatment: Rolling walker (2 wheels)  Activity Tolerance: Patient tolerated treatment well Patient left: in chair;with call bell/phone within reach;with chair alarm set  OT Visit Diagnosis: Unsteadiness on feet (R26.81);Other symptoms and signs involving cognitive function;Other symptoms and signs involving the nervous system (R29.898);Muscle weakness (generalized) (M62.81);History of falling (Z91.81)                Time: 3953-2023 OT Time  Calculation (min): 22 min Charges:  OT General Charges $OT Visit: 1 Visit OT Evaluation $OT Eval Low Complexity: 1 Low  Anderson Malta,  OT Acute Rehab Services Office: [phone] 03/09/2021 Julien Girt 03/09/2021, 4:11 PM

## 2021-03-10 ENCOUNTER — Ambulatory Visit
Admit: 2021-03-10 | Discharge: 2021-03-10 | Disposition: A | Discharge status: Home / Self Care | Payer: PPO | Attending: Radiation Oncology | Admitting: Radiation Oncology

## 2021-03-10 DIAGNOSIS — C7931 Secondary malignant neoplasm of brain: Secondary | ICD-10-CM

## 2021-03-10 DIAGNOSIS — I619 Nontraumatic intracerebral hemorrhage, unspecified: Secondary | ICD-10-CM | POA: Diagnosis not present

## 2021-03-10 DIAGNOSIS — C3490 Malignant neoplasm of unspecified part of unspecified bronchus or lung: Secondary | ICD-10-CM | POA: Diagnosis not present

## 2021-03-10 DIAGNOSIS — I10 Essential (primary) hypertension: Secondary | ICD-10-CM | POA: Diagnosis not present

## 2021-03-10 DIAGNOSIS — C3431 Malignant neoplasm of lower lobe, right bronchus or lung: Secondary | ICD-10-CM | POA: Diagnosis not present

## 2021-03-10 LAB — BASIC METABOLIC PANEL
Anion gap: 4 — ABNORMAL LOW (ref 5–15)
BUN: 25 mg/dL — ABNORMAL HIGH (ref 8–23)
CO2: 26 mmol/L (ref 22–32)
Calcium: 8.9 mg/dL (ref 8.9–10.3)
Chloride: 110 mmol/L (ref 98–111)
Creatinine, Ser: 0.68 mg/dL (ref 0.44–1.00)
GFR, Estimated: >60 mL/min (ref >60)
Glucose, Bld: 172 mg/dL — ABNORMAL HIGH (ref 70–99)
Potassium: 4.4 mmol/L (ref 3.5–5.1)
Sodium: 140 mmol/L (ref 135–145)

## 2021-03-10 LAB — CBC WITH DIFFERENTIAL/PLATELET
Abs Immature Granulocytes: 0.15 10*3/uL — ABNORMAL HIGH (ref 0.00–0.07)
Basophils Absolute: 0 10*3/uL (ref 0.0–0.1)
Basophils Relative: 0 %
Eosinophils Absolute: 0 10*3/uL (ref 0.0–0.5)
Eosinophils Relative: 0 %
HCT: 35.4 % — ABNORMAL LOW (ref 36.0–46.0)
Hemoglobin: 11.7 g/dL — ABNORMAL LOW (ref 12.0–15.0)
Immature Granulocytes: 2 %
Lymphocytes Relative: 12 %
Lymphs Abs: 1 10*3/uL (ref 0.7–4.0)
MCH: 30.6 pg (ref 26.0–34.0)
MCHC: 33.1 g/dL (ref 30.0–36.0)
MCV: 92.7 fL (ref 80.0–100.0)
Monocytes Absolute: 0.5 10*3/uL (ref 0.1–1.0)
Monocytes Relative: 6 %
Neutro Abs: 6.4 10*3/uL (ref 1.7–7.7)
Neutrophils Relative %: 80 %
Platelets: 268 10*3/uL (ref 150–400)
RBC: 3.82 MIL/uL — ABNORMAL LOW (ref 3.87–5.11)
RDW: 12.5 % (ref 11.5–15.5)
WBC: 8 10*3/uL (ref 4.0–10.5)
nRBC: 0 % (ref 0.0–0.2)

## 2021-03-10 LAB — GLUCOSE, CAPILLARY
Glucose-Capillary: 162 mg/dL — ABNORMAL HIGH (ref 70–99)
Glucose-Capillary: 168 mg/dL — ABNORMAL HIGH (ref 70–99)
Glucose-Capillary: 117 mg/dL — ABNORMAL HIGH (ref 70–99)
Glucose-Capillary: 292 mg/dL — ABNORMAL HIGH (ref 70–99)

## 2021-03-10 LAB — MAGNESIUM: Magnesium: 2.1 mg/dL (ref 1.7–2.4)

## 2021-03-10 MED ORDER — INSULIN ASPART 100 UNIT/ML IJ SOLN
5.0000 [IU] | Freq: Once | INTRAMUSCULAR | Status: AC
  Administered 2021-03-10: 23:00:00 5 [IU] via SUBCUTANEOUS

## 2021-03-10 NOTE — Plan of Care (Signed)

## 2021-03-10 NOTE — Progress Notes (Signed)
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 She was admitted to [facility] in July on this year for resection of right posterior frontal dural based met and has since been off therapy. She was left with some residual left sided weakness as a result of tumor/surgery. She presented to [facility] ED 11/21 with complaints of worsening left sided weakness. Also with new onset delirium and visual hallucinations. Upon arrival to ED, CT head was  demonstrated progression of metastatic lung cancer with multiple hemorrhagic lesions and vasogenic edema. Notably she is on Eliquis for DVT. Neurosurgery has evaluated in the ED and does not see a need for any surgical intervention at this time. They requested PCCM to admit to ICU.  -Patient admitted under PCCM, patient started on IV Decadron and Keppra with some clinical improvement.  Patient seen by radiation oncology and whole brain radiation started 03/07/2021.  Patient transferred to [facility] for ongoing radiation treatment. -Patient subsequently transferred to hospitalist service on 03/09/2021.     Assessment & Plan:   Principal Problem:   Intraparenchymal hemorrhage of brain (HCC) Active Problems:   Diabetes mellitus with no complication (HCC)   Brain metastasis (HCC)   Vasogenic edema (HCC)   Midline shift of brain   Chronic anticoagulation   Lung cancer metastatic to brain ([place])   Pressure injury of skin   Hypertension  1 intraparenchymal hemorrhage of the brain secondary to metastatic brain lesions, POA -Patient had presented with worsening left-sided weakness, CT head done on admission with marked progression of metastatic lung disease with multiple new hemorrhagic metastatic lesions noted with significant right  frontal vasogenic edema resulting in left midline shift of approximately 0.8 cm. -MRI brain done with marked progression of intracranial metastatic disease, largely dural spread of tumor over the right cerebral hemisphere with some intraparenchymal components notably in the anterior right frontal lobe with extensive vasogenic edema 1.3 cm leftward midline shift, possible small volume subarachnoid and subdural hemorrhage. -Patient seen in consultation by neurosurgery who recommended close neuro monitoring, holding oral anticoagulation, Decadron 4 mg every 6 hours, Keppra, no surgical intervention at this time. -Status post Kcentra given in the ED for Eliquis reversal. -Continue to hold anticoagulation with Eliquis. -Patient seen by radiation oncology and patient started on whole brain radiation treatments that she is receiving daily.. -Oncology informed via epic of patient's admission. -Supportive care.  2.  Acute metabolic encephalopathy -Likely secondary to problem #1. -Clinical improvement on current regimen of Keppra and Decadron.  -Patient being followed by radiation oncology and receiving whole brain radiation treatments.   3.  Hyperlipidemia -Continue statin.   4.  Stage IV non-small cell lung cancer -Status post chemoradiation to the lung, now with mets to the brain status postresection July with stereotactic radiosurgery in August. -CT head done with multiple hemorrhagic lesions and progression of mets. -Oncology informed via epic of patient's admission. -Patient currently receiving whole brain radiation treatments.  5.  DVT 12/2020 -Currently Eliquis on hold due to problem #1. -Repeat lower extremity Dopplers done negative for DVT bilaterally.  6.  Hypertension -BP was soft but improving.   -Continue to hold lisinopril.    DVT prophylaxis: SCDs Code Status: DNR Family Communication: Updated patient, no family at  bedside.  Disposition:   Status is: Inpatient  Remains  inpatient appropriate because: Severity of illness       Consultants:  Neurosurgery: Dr. Reatha Armour 03/05/2021 Palliative care: Jobe Gibbon 03/08/2021 Oncology informed via epic of admission. Radiation oncology: Dr. Sondra Come 03/06/2021  Procedures:  CT head 03/05/2021 MRI brain 03/06/2021 Lower extremity Dopplers 03/07/2021   Significant Hospital Events: Including procedures, antibiotic start and stop dates in addition to other pertinent events   11/23 admit to ICU MRI 11/24 1. Marked progression of intracranial metastatic disease since 11/27/2020. This is largely dural spread of tumor over the right cerebral hemisphere with some intraparenchymal components, most notably in the anterior right frontal lobe where there is extensive vasogenic edema and 1.3 cm of leftward midline shift. Possible small volume subarachnoid and subdural hemorrhage. 11/24 transferred to [facility] for brain RT venous duplex 11/25 > neg LE  bilaterally       Antimicrobials:  None   Subjective: Sitting up in bed.  Awaiting for nurse tech to help her to the bathroom.  No chest pain.  No shortness of breath.  No abdominal pain.  Overall feeling better than on admission.  Awaiting to go to radiation treatment around 4 PM this afternoon per patient.    Objective: Vitals:   03/09/21 2004 03/10/21 0500 03/10/21 0521 03/10/21 1306  BP: 107/61  108/65 133/79  Pulse: 100  84 88  Resp: '16  16 20  ' Temp: 98.2 F (36.8 C)  97.6 F (36.4 C) 98.1 F (36.7 C)  TempSrc: Oral  Oral Oral  SpO2: 94%  94% 96%  Weight:  61.7 kg    Height:        Intake/Output Summary (Last 24 hours) at 03/10/2021 1340 Last data filed at 03/09/2021 2300 Gross per 24 hour  Intake 1335.79 ml  Output --  Net 1335.79 ml    Filed Weights   03/08/21 0355 03/09/21 0500 03/10/21 0500  Weight: 60.9 kg 63.2 kg 61.7 kg    Examination:  General exam: NAD Respiratory system: Lungs clear to auscultation bilaterally.  No  wheezes, no crackles, no rhonchi.  Normal respiratory effort. Cardiovascular system: Regular rate and rhythm no murmurs rubs or gallops.  No JVD.  No lower extremity edema. Gastrointestinal system: Abdomen soft, nontender, nondistended, positive bowel sounds.  No rebound.  No guarding.  Central nervous system: Alert and oriented. No focal neurological deficits. Extremities: Left upper extremity 3-4/5 strength, right upper extremity 5/5 strength.  Skin: No rashes, lesions or ulcers Psychiatry: Judgement and insight appear normal. Mood & affect appropriate.     Data Reviewed: I have personally reviewed following labs and imaging studies  CBC: Recent Labs  Lab 03/05/21 1645 03/06/21 0230 03/08/21 0243 03/09/21 1005 03/10/21 0459  WBC 5.7 6.5 13.9* 10.1 8.0  NEUTROABS 3.4  --   --  8.6* 6.4  HGB 11.2* 12.0 11.5* 11.8* 11.7*  HCT 34.0* 35.6* 35.7* 36.0 35.4*  MCV 93.9 91.8 94.9 94.7 92.7  PLT 256 283 300 276 268     Basic Metabolic Panel: Recent Labs  Lab 03/05/21 1645 03/06/21 0230 03/08/21 0243 03/09/21 1005 03/10/21 0459  NA 142 141 142 137 140  K 3.6 3.9 4.0 4.0 4.4  CL 108 109 110 109 110  CO2 '26 24 25 24 26  ' GLUCOSE 119* 199* 134* 163* 172*  BUN 5* 7* 18 23 25*  CREATININE 0.70 0.70 0.62 0.63 0.68  CALCIUM 9.4 9.3 9.3 8.5* 8.9  MG 1.7 2.3  --  1.9 2.1     GFR: Estimated Creatinine Clearance: 47 mL/min (by C-G formula based on SCr of 0.68 mg/dL).  Liver Function Tests: Recent Labs  Lab 03/05/21 1645  AST 25  ALT 17  ALKPHOS 117  BILITOT 0.7  PROT 5.8*  ALBUMIN 3.3*     CBG: Recent Labs  Lab 03/09/21 1247 03/09/21 1604 03/09/21 2001 03/10/21 0735 03/10/21 1205  GLUCAP 173* 227* 159* 162* 168*      Recent Results (from the past 240 hour(s))  Resp Panel by RT-PCR (Flu A&B, Covid) Nasopharyngeal Swab     Status: None   Collection Time: 03/05/21  6:03 PM   Specimen: Nasopharyngeal Swab; Nasopharyngeal(NP) swabs in vial transport medium   Result Value Ref Range Status   SARS Coronavirus 2 by RT PCR NEGATIVE NEGATIVE Final    Comment: (NOTE) SARS-CoV-2 target nucleic acids are NOT DETECTED.  The SARS-CoV-2 RNA is generally detectable in upper respiratory specimens during the acute phase of infection. The lowest concentration of SARS-CoV-2 viral copies this assay can detect is 138 copies/mL. A negative result does not preclude SARS-Cov-2 infection and should not be used as the sole basis for treatment or other patient management decisions. A negative result may occur with  improper specimen collection/handling, submission of specimen other than nasopharyngeal swab, presence of viral mutation(s) within the areas targeted by this assay, and inadequate number of viral copies(<138 copies/mL). A negative result must be combined with clinical observations, patient history, and epidemiological information. The expected result is Negative.  Fact Sheet for Patients:  EntrepreneurPulse.com.au  Fact Sheet for Healthcare Providers:  IncredibleEmployment.be  This test is no t yet approved or cleared by the [place] FDA and  has been authorized for detection and/or diagnosis of SARS-CoV-2 by FDA under an Emergency Use Authorization (EUA). This EUA will remain  in effect (meaning this test can be used) for the duration of the COVID-19 declaration under Section 564(b)(1) of the Act, 21 U.S.C.section 360bbb-3(b)(1), unless the authorization is terminated  or revoked sooner.       Influenza A by PCR NEGATIVE NEGATIVE Final   Influenza B by PCR NEGATIVE NEGATIVE Final    Comment: (NOTE) The Xpert Xpress SARS-CoV-2/FLU/RSV plus assay is intended as an aid in the diagnosis of influenza from Nasopharyngeal swab specimens and should not be used as a sole basis for treatment. Nasal washings and aspirates are unacceptable for Xpert Xpress SARS-CoV-2/FLU/RSV testing.  Fact Sheet for  Patients: EntrepreneurPulse.com.au  Fact Sheet for Healthcare Providers: IncredibleEmployment.be  This test is not yet approved or cleared by the [place] FDA and has been authorized for detection and/or diagnosis of SARS-CoV-2 by FDA under an Emergency Use Authorization (EUA). This EUA will remain in effect (meaning this test can be used) for the duration of the COVID-19 declaration under Section 564(b)(1) of the Act, 21 U.S.C. section 360bbb-3(b)(1), unless the authorization is terminated or revoked.  Performed at [facility], [place] [address], [place], [place] [address]   MRSA Next Gen by PCR, Nasal     Status: None   Collection Time: 03/05/21 10:25 PM   Specimen: Nasal Mucosa; Nasal Swab  Result Value Ref Range Status   MRSA by PCR Next Gen NOT DETECTED NOT DETECTED Final    Comment: (NOTE) The GeneXpert MRSA Assay (FDA approved for NASAL specimens only), is one component of a comprehensive MRSA colonization surveillance program. It is not intended to diagnose MRSA infection nor to guide or monitor treatment for MRSA  infections. Test performance is not FDA approved in patients less than 59 years old. Performed at [facility], [place] [address] [place], [place] [address]           Radiology Studies: No results found.      Scheduled Meds:  amitriptyline  50 mg Oral QHS   atorvastatin  40 mg Oral QHS   Chlorhexidine Gluconate Cloth  6 each Topical Daily   dexamethasone (DECADRON) injection  4 mg Intravenous Q6H   famotidine  20 mg Oral Daily   feeding supplement  237 mL Oral Q24H   feeding supplement (GLUCERNA SHAKE)  237 mL Oral Q24H   insulin aspart  0-15 Units Subcutaneous TID WC   levETIRAcetam  500 mg Oral BID   mouth rinse  15 mL Mouth Rinse BID   multivitamin with minerals  1 tablet Oral Daily   vitamin B-12  1,000 mcg Oral Daily   Continuous Infusions:  0.9 % NaCl with KCl 20 mEq / L 60 mL/hr at  03/10/21 0233   dexmedetomidine (PRECEDEX) IV infusion Stopped (03/07/21 1500)     LOS: 5 days    Time spent: 35 minutes    Irine Seal, MD [facility]   To contact the attending provider between 7A-7P or the covering provider during after hours 7P-7A, please log into the web site [email or web address] and access using universal [place] password for that web site. If you do not have the password, please call the hospital operator.  03/10/2021, 1:40 PM

## 2021-03-11 ENCOUNTER — Ambulatory Visit
Admit: 2021-03-11 | Discharge: 2021-03-11 | Disposition: A | Discharge status: Home / Self Care | Payer: PPO | Attending: Radiation Oncology | Admitting: Radiation Oncology

## 2021-03-11 DIAGNOSIS — C7931 Secondary malignant neoplasm of brain: Secondary | ICD-10-CM | POA: Diagnosis not present

## 2021-03-11 DIAGNOSIS — C3490 Malignant neoplasm of unspecified part of unspecified bronchus or lung: Secondary | ICD-10-CM | POA: Diagnosis not present

## 2021-03-11 DIAGNOSIS — I619 Nontraumatic intracerebral hemorrhage, unspecified: Secondary | ICD-10-CM | POA: Diagnosis not present

## 2021-03-11 DIAGNOSIS — I10 Essential (primary) hypertension: Secondary | ICD-10-CM | POA: Diagnosis not present

## 2021-03-11 LAB — GLUCOSE, CAPILLARY
Glucose-Capillary: 171 mg/dL — ABNORMAL HIGH (ref 70–99)
Glucose-Capillary: 245 mg/dL — ABNORMAL HIGH (ref 70–99)
Glucose-Capillary: 245 mg/dL — ABNORMAL HIGH (ref 70–99)
Glucose-Capillary: 164 mg/dL — ABNORMAL HIGH (ref 70–99)

## 2021-03-11 MED ORDER — INSULIN GLARGINE-YFGN 100 UNIT/ML ~~LOC~~ SOLN
5.0000 [IU] | Freq: Every day | SUBCUTANEOUS | Status: DC
  Administered 2021-03-11 – 2021-03-13 (×3): 5 [IU] via SUBCUTANEOUS
  Filled 2021-03-11 (×3): qty 0.05

## 2021-03-11 MED ORDER — DEXAMETHASONE 4 MG PO TABS
4.0000 mg | ORAL_TABLET | Freq: Four times a day (QID) | ORAL | Status: DC
  Administered 2021-03-12 – 2021-03-19 (×31): 4 mg via ORAL
  Filled 2021-03-11 (×31): qty 1

## 2021-03-11 MED ORDER — DEXAMETHASONE SODIUM PHOSPHATE 10 MG/ML IJ SOLN
4.0000 mg | Freq: Once | INTRAMUSCULAR | Status: AC
  Administered 2021-03-11: 4 mg via INTRAVENOUS

## 2021-03-11 NOTE — Care Management Important Message (Signed)
Important Message  Patient Details IM Letter placed in Patients room. Name: Katelyn Kennedy MRN: [id number] Date of Birth: 03-Feb-1946   Medicare Important Message Given:  Yes     [name] 03/11/2021, 10:30 AM

## 2021-03-11 NOTE — Progress Notes (Signed)
Physical Therapy Treatment Patient Details Name: Katelyn Kennedy MRN: [id number] DOB: 18-Jun-1945 Today's Date: 03/11/2021   History of Present Illness 75 year old female adm through [name] ED 11/21 with complaints of worsening left sided weakness, new onset delirium and visual hallucinations. CT head demonstrated progression of metastatic lung cancer with multiple hemorrhagic lesions and vasogenic edema. Notably she is on Eliquis for DVT. Neurosurgery consulted and did not recommend surgical intervention at this time.  PMH:HTN, arthritis, DM,. 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    PT Comments    Pt making good progress with mobility.  She does have difficulty attending to task and with initiation requiring min-mod cues.   Pt reports she will have supervision at d/c. Updated recommendation to home with supervision due to good progress.    Recommendations for follow up therapy are one component of a multi-disciplinary discharge planning process, led by the attending physician.  Recommendations may be updated based on patient status, additional functional criteria and insurance authorization.  Follow Up Recommendations  Home health PT     Assistance Recommended at Discharge Frequent or constant Supervision/Assistance (due to cognition)  Equipment Recommendations  Rollator (4 wheels)    Recommendations for Other Services       Precautions / Restrictions Precautions Precautions: Fall     Mobility  Bed Mobility Overal bed mobility: Needs Assistance Bed Mobility: Supine to Sit     Supine to sit: Supervision     General bed mobility comments: increased time, cues to initiate, cues for hand placement/technique to do with supervision    Transfers Overall transfer level: Needs assistance Equipment used: Rolling walker (2 wheels) Transfers:  Sit to/from Stand Sit to Stand: Supervision           General transfer comment: Performed from bed and toilet; performed toileting ADLS with supervision for safety    Ambulation/Gait Ambulation/Gait assistance: Min guard Gait Distance (Feet): 300 Feet Assistive device: Rolling walker (2 wheels) Gait Pattern/deviations: Step-through pattern;Decreased stride length Gait velocity: decreased     General Gait Details: Min guard for safety with cues for RW use and avoiding objects in hallway (pt used to rollator); cues to stay focused on task at hand   Stairs             Wheelchair Mobility    Modified Rankin (Stroke Patients Only)       Balance Overall balance assessment: Needs assistance Sitting-balance support: Feet supported;No upper extremity supported Sitting balance-Leahy Scale: Good       Standing balance-Leahy Scale: Fair Standing balance comment: Static stand and a few steps without AD but overall used RW                            Cognition Arousal/Alertness: Awake/alert Behavior During Therapy: WFL for tasks assessed/performed Overall Cognitive Status: No family/caregiver present to determine baseline cognitive functioning Area of Impairment: Attention;Following commands;Problem solving;Safety/judgement;Memory                   Current Attention Level: Sustained Memory: Decreased short-term memory Following Commands: Follows one step commands with increased time;Follows multi-step commands inconsistently   Awareness: Emergent Problem Solving: Difficulty sequencing;Requires verbal cues;Requires tactile cues;Slow processing;Decreased initiation General Comments: Pt easily distracted and often needs cues to redirect/attend to task  Exercises General Exercises - Lower Extremity Ankle Circles/Pumps: AROM;20 reps;Supine;Both Heel Slides: AROM;Both;10 reps;Supine Straight Leg Raises: AROM;Both;Supine;20 reps    General  Comments General comments (skin integrity, edema, etc.): VSS      Pertinent Vitals/Pain Pain Assessment: No/denies pain    Home Living                          Prior Function            PT Goals (current goals can now be found in the care plan section) Progress towards PT goals: Progressing toward goals    Frequency    Min 3X/week      PT Plan Discharge plan needs to be updated;Equipment recommendations need to be updated    Co-evaluation              AM-PAC PT "6 Clicks" Mobility   Outcome Measure  Help needed turning from your back to your side while in a flat bed without using bedrails?: A Little Help needed moving from lying on your back to sitting on the side of a flat bed without using bedrails?: A Little Help needed moving to and from a bed to a chair (including a wheelchair)?: A Little Help needed standing up from a chair using your arms (e.g., wheelchair or bedside chair)?: A Little Help needed to walk in hospital room?: A Little Help needed climbing 3-5 steps with a railing? : A Little 6 Click Score: 18    End of Session Equipment Utilized During Treatment: Gait belt Activity Tolerance: Patient tolerated treatment well Patient left: in chair;with call bell/phone within reach;with chair alarm set Nurse Communication: Mobility status PT Visit Diagnosis: Other abnormalities of gait and mobility (R26.89);Other symptoms and signs involving the nervous system (R29.898)     Time: 5301-0404 PT Time Calculation (min) (ACUTE ONLY): 27 min  Charges:  $Gait Training: 8-22 mins $Therapeutic Activity: 8-22 mins                     Abran Richard, PT Acute Rehab Services Pager [phone] [facility] 03/11/2021, 10:50 AM

## 2021-03-11 NOTE — TOC Initial Note (Signed)
Transition of Care [facility]) - Initial/Assessment Note    Patient Details  Name: Katelyn Kennedy MRN: [id number] Date of Birth: 05-26-1945  [facility]) CM/SW Contact:    Katelyn Mage, LCSW Phone Number: 03/11/2021, 11:25 AM  Clinical Narrative:   Patient seen in follow up to initial PT recommendation of SNF,  changed today to [facility] PT.    [name] confirms that she is scheduled for radiation therapy through 12/7.  She states that upon d/c, her niece will stay nights with her and good friend will be there during the day, so she will have 24 hour help and supervision post d/c.  We agreed to check in again early next week to see how she is doing.  [name] acknowledged that the back up plan is SNF for short term rehab if she is unable to return home upon completion of radiation. TOC will continue to follow during the course of hospitalization.              Expected Discharge Plan: Katelyn Kennedy to Discharge: No Kennedy Identified   Patient Goals and CMS Choice     Choice offered to / list presented to : Patient  Expected Discharge Plan and Services Expected Discharge Plan: [place]   Discharge Planning Services: CM Consult Post Acute Care Choice: [place] arrangements for the past 2 months: Apartment                                      Prior Living Arrangements/Services Living arrangements for the past 2 months: Apartment Lives with:: Self Patient language and need for interpreter reviewed:: Yes Do you feel safe going back to the place where you live?: Yes      Need for Family Participation in Patient Care: Yes (Comment) Care giver support system in place?: Yes (comment)   Criminal Activity/Legal Involvement Pertinent to Current Situation/Hospitalization: No - Comment as needed  Activities of Daily Living Home Assistive Devices/Equipment: None ADL Screening (condition at time of admission) Patient's  cognitive ability adequate to safely complete daily activities?: No Is the patient deaf or have difficulty hearing?: No Does the patient have difficulty seeing, even when wearing glasses/contacts?: No Does the patient have difficulty concentrating, remembering, or making decisions?: Yes Patient able to express need for assistance with ADLs?: Yes Does the patient have difficulty dressing or bathing?: Yes Independently performs ADLs?: No Communication: Independent Dressing (OT): Independent Is this a change from baseline?: Change from baseline, expected to last >3 days Grooming: Independent Feeding: Independent Bathing: Needs assistance Is this a change from baseline?: Change from baseline, expected to last >3 days Toileting: Needs assistance Is this a change from baseline?: Change from baseline, expected to last >3days In/Out Bed: Needs assistance Is this a change from baseline?: Change from baseline, expected to last >3 days Walks in Home: Needs assistance Is this a change from baseline?: Change from baseline, expected to last >3 days Does the patient have difficulty walking or climbing stairs?: Yes Weakness of Legs: Both Weakness of Arms/Hands: Both  Permission Sought/Granted                  Emotional Assessment Appearance:: Appears stated age Attitude/Demeanor/Rapport: Engaged Affect (typically observed): Appropriate Orientation: : Oriented to Self, Oriented to Place, Oriented to Situation, Oriented to  Time Alcohol / Substance Use: Not Applicable Psych  Involvement: No (comment)  Admission diagnosis:  Weakness [R53.1] Vasogenic brain edema (HCC) [G93.6] Intraparenchymal hemorrhage of brain ([place]) [I61.9] Lung cancer metastatic to brain ([place]) [C34.90, C79.31] Patient Active Problem List   Diagnosis Date Noted   Hypertension    Pressure injury of skin 03/07/2021   Intraparenchymal hemorrhage of brain ([place]) 03/05/2021   Vasogenic edema ([place]) 03/05/2021   Midline shift  of brain 03/05/2021   Chronic anticoagulation 03/05/2021   Lung cancer metastatic to brain ([place]) 03/05/2021   Brain metastasis ([place]) 11/07/2020   History of lung cancer 10/15/2020   Atherosclerosis of aorta ([place]) 10/15/2020   Pain in lateral left lower extremity 04/30/2020   Multiple thyroid nodules 04/03/2019   History of bladder cancer 09/30/2018   Encounter for antineoplastic immunotherapy 09/13/2017   Chronic insomnia 08/17/2017   Encounter for antineoplastic chemotherapy 06/17/2017   Goals of care, counseling/discussion 06/17/2017   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 ([place]) 04/29/2017   Counseling regarding end of life decision making 07/31/2014   Eczema 07/20/2011   MICROALBUMINURIA 03/25/2010   Osteoporosis 10/08/2009   Diabetes mellitus with no complication ([place]) 62/13/0865   Hyperlipidemia associated with type 2 diabetes mellitus ([place]) 09/03/2009   Allergic rhinitis 09/03/2009   ARTHRITIS 09/03/2009   [place], HX OF 09/03/2009   PCP:  [name], MD Pharmacy:   [facility] [place], [place] AT [facility] [address] [place] [address] Phone: [phone] Fax: [phone]     Social Determinants of Health ([place]) Interventions    Readmission Risk Interventions Readmission Risk Prevention Plan 11/08/2020  Post Dischage Appt Complete  Medication Screening Complete  Transportation Screening Complete  Some recent data might be hidden

## 2021-03-11 NOTE — Progress Notes (Signed)
Alerted by the nurse secretary that patient daughter on the phone and upset that patient did not receive a lunch or dinner tray last night and wanted to speak with an Scientist, physiological.  Writer answered the phone and identified self to caller.  Caller was verbally aggressive on the phone with the Probation officer and used explicit language.  Writer apologized for the tray incident and would follow up with prevent from happening in the future. Caller continued with inappropriate with communication, reports will call the [employer] [place].  Writer attempted to explain that pt is now on automatic trays to prevent any future issues and that writer was following up with staff to determine tray issues from yesterday.  Caller at this time hung up on the Probation officer.

## 2021-03-11 NOTE — Progress Notes (Signed)
Leadership followed up with staff regarding family concern that pt did not receive a lunch or dinner tray last night.  M. Paulemond NT states pt did receive a lunch tray but unsure of dinner tray as she was floated off the unit.  B Duncan NT and Azzie Almas LPN states there was an issue with the pts dinner tray being canceled per [facility], however tray was reordered.  Tray never received by end of shift, so reported to night RN Rolena Infante and [place] sandwich provided.  Leadership to follow up with dietary and [facility] regarding tray cancellation.

## 2021-03-11 NOTE — Progress Notes (Signed)
Brief oncology note:  I was notified by the hospitalist that the patient was admitted due to worsening brain mets.  She has been started on dexamethasone and Keppra.  She is also receiving whole brain radiation under the care of Dr. Sondra Come.  Case was discussed with Dr. Julien Nordmann who agrees with whole brain radiation.  Dexamethasone to be managed/tapered per radiation oncology.  Dr. Julien Nordmann reviewed her recent imaging and given a stable PET scan in September 2022, he does not recommend any additional imaging from our standpoint.  He recommends outpatient follow-up as previously scheduled with him.  I will stop and see the patient later this week if she remains in the hospital.  Future Appointments  Date Time Provider [place]  03/12/2021 12:15 PM [facility] LINAC 3 [facility] None  03/13/2021  9:00 AM GI-315 MR 3 GI-315MRI GI-[address]  03/13/2021  2:45 PM [facility] LINAC 3 [facility] None  03/14/2021  4:30 PM [facility] LINAC 4 [facility] None  03/17/2021  7:00 AM [facility]-TUMOR BOARD CONFERENCE [facility]MED[facility] None  03/17/2021 11:30 AM Vaslow, Acey Lav, MD [facility] None  03/17/2021 12:00 PM [facility] LINAC 4 [facility] None  03/18/2021 12:15 PM [facility] LINAC 4 [facility] None  03/19/2021 12:15 PM [facility]RADONC [id number] [facility] None  04/28/2021 11:00 AM [facility]MED-ONC LAB [facility] None  04/28/2021 11:30 AM Curt Bears, MD [facility] None  08/21/2021 11:00 AM Stoioff, Ronda Fairly, MD [facility] None

## 2021-03-11 NOTE — Progress Notes (Addendum)
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 She was admitted to [facility] in July on this year for resection of right posterior frontal dural based met and has since been off therapy. She was left with some residual left sided weakness as a result of tumor/surgery. She presented to [facility] ED 11/21 with complaints of worsening left sided weakness. Also with new onset delirium and visual hallucinations. Upon arrival to ED, CT head was  demonstrated progression of metastatic lung cancer with multiple hemorrhagic lesions and vasogenic edema. Notably she is on Eliquis for DVT. Neurosurgery has evaluated in the ED and does not see a need for any surgical intervention at this time. They requested PCCM to admit to ICU.  -Patient admitted under PCCM, patient started on IV Decadron and Keppra with some clinical improvement.  Patient seen by radiation oncology and whole brain radiation started 03/07/2021.  Patient transferred to [facility] for ongoing radiation treatment. -Patient subsequently transferred to hospitalist service on 03/09/2021.     Assessment & Plan:   Principal Problem:   Intraparenchymal hemorrhage of brain (HCC) Active Problems:   Diabetes mellitus with no complication (HCC)   Brain metastasis (HCC)   Vasogenic edema (HCC)   Midline shift of brain   Chronic anticoagulation   Lung cancer metastatic to brain ([place])   Pressure injury of skin   Hypertension  1 intraparenchymal hemorrhage of the brain secondary to metastatic brain lesions, POA -Patient had presented with worsening left-sided weakness, CT head done on admission with marked progression of metastatic lung disease with multiple new hemorrhagic metastatic lesions noted with significant right  frontal vasogenic edema resulting in left midline shift of approximately 0.8 cm. -MRI brain done with marked progression of intracranial metastatic disease, largely dural spread of tumor over the right cerebral hemisphere with some intraparenchymal components notably in the anterior right frontal lobe with extensive vasogenic edema 1.3 cm leftward midline shift, possible small volume subarachnoid and subdural hemorrhage. -Patient seen in consultation by neurosurgery who recommended close neuro monitoring, holding oral anticoagulation, Decadron 4 mg every 6 hours, Keppra, no surgical intervention at this time. -Status post Kcentra given in the ED for Eliquis reversal. -Continue to hold anticoagulation with Eliquis. -Patient seen by radiation oncology and patient started on whole brain radiation treatments that she is receiving daily with completion date of 03/19/2021. -Per oncology steroid taper to be managed by radiation oncology. -Change IV Decadron to oral Decadron. -Oncology informed via epic of patient's admission. -Supportive care.  2.  Acute metabolic encephalopathy -Likely secondary to problem #1. -Clinical improvement on current regimen of Keppra and Decadron.   -Change IV Decadron to p.o. -Patient being followed by radiation oncology and receiving whole brain radiation treatments.   3.  Hyperlipidemia -Statin.    4.  Stage IV non-small cell lung cancer -Status post chemoradiation to the lung, now with mets to the brain status postresection July with stereotactic radiosurgery in August. -CT head done with multiple hemorrhagic lesions and progression of mets. -Oncology informed via epic of patient's admission. -Patient currently receiving whole brain radiation treatments. -Oncology following.  5.  DVT 12/2020 -Currently Eliquis on hold due to problem #1. -Repeat lower extremity Dopplers done negative for DVT bilaterally. -Hematology/oncology to  advise as to whether and when to  resume anticoagulation or discontinue altogether.  6.  Hypertension -BP noted to be borderline early on in the hospitalization but has improved.   -Continue to hold lisinopril.  7.  Diet-controlled type 2 diabetes mellitus/hyperglycemia -Hemoglobin A1c 4.9 (03/05/2021) -CBGs elevated at 171 this morning. -Patient on steroids likely will be hyperglycemic. -Place on low-dose Lantus/Semglee 5 units daily, -Continue SSI.   DVT prophylaxis: SCDs Code Status: DNR Family Communication: Updated patient, no family at bedside.  Disposition:   Status is: Inpatient  Remains inpatient appropriate because: Severity of illness       Consultants:  Neurosurgery: Dr. Reatha Armour 03/05/2021 Palliative care: Jobe Gibbon 03/08/2021 Oncology informed via epic of admission. Radiation oncology: Dr. Sondra Come 03/06/2021  Procedures:  CT head 03/05/2021 MRI brain 03/06/2021 Lower extremity Dopplers 03/07/2021   Significant Hospital Events: Including procedures, antibiotic start and stop dates in addition to other pertinent events   11/23 admit to ICU MRI 11/24 1. Marked progression of intracranial metastatic disease since 11/27/2020. This is largely dural spread of tumor over the right cerebral hemisphere with some intraparenchymal components, most notably in the anterior right frontal lobe where there is extensive vasogenic edema and 1.3 cm of leftward midline shift. Possible small volume subarachnoid and subdural hemorrhage. 11/24 transferred to [facility] for brain RT venous duplex 11/25 > neg LE  bilaterally       Antimicrobials:  None   Subjective: Sitting up in chair.  Just returned from radiation treatment.  No chest pain.  No shortness of breath.  Overall feeling better.    Objective: Vitals:   03/10/21 2156 03/11/21 0500 03/11/21 0510 03/11/21 1215  BP: (!) 117/56  114/62 127/61  Pulse: 100  80 89  Resp: '15  16 18  ' Temp: 97.6 F (36.4 C)  98.2 F (36.8 C) 98.2 F  (36.8 C)  TempSrc:    Oral  SpO2: 95%  95% 97%  Weight:  62 kg    Height:        Intake/Output Summary (Last 24 hours) at 03/11/2021 1731 Last data filed at 03/11/2021 1300 Gross per 24 hour  Intake 680 ml  Output --  Net 680 ml    Filed Weights   03/09/21 0500 03/10/21 0500 03/11/21 0500  Weight: 63.2 kg 61.7 kg 62 kg    Examination:  General exam: NAD. Respiratory system: Lungs clear to auscultation bilaterally.  No wheezes, no crackles, no rhonchi.  Normal respiratory effort. Cardiovascular system: RRR no murmurs rubs or gallops.  No JVD.  No lower extremity edema. Gastrointestinal system: Abdomen is soft, nontender, nondistended, positive bowel sounds.  No rebound.  No guarding.  Central nervous system: Alert and oriented.  No focal neurological deficits.  Moving extremities spontaneously.  Extremities: Left upper extremity 3-4/5 strength, right upper extremity 5/5 strength.  Skin: No rashes, lesions or ulcers Psychiatry: Judgement and insight appear normal. Mood & affect appropriate.     Data Reviewed: I have personally reviewed following labs and imaging studies  CBC: Recent Labs  Lab 03/05/21 1645 03/06/21 0230 03/08/21 0243 03/09/21 1005 03/10/21 0459  WBC 5.7 6.5 13.9* 10.1 8.0  NEUTROABS 3.4  --   --  8.6* 6.4  HGB 11.2* 12.0 11.5* 11.8* 11.7*  HCT 34.0* 35.6* 35.7* 36.0 35.4*  MCV 93.9 91.8 94.9 94.7 92.7  PLT 256 283 300 276 268     Basic Metabolic Panel: Recent Labs  Lab 03/05/21 1645 03/06/21 0230 03/08/21 0243 03/09/21 1005 03/10/21  0459  NA 142 141 142 137 140  K 3.6 3.9 4.0 4.0 4.4  CL 108 109 110 109 110  CO2 '26 24 25 24 26  ' GLUCOSE 119* 199* 134* 163* 172*  BUN 5* 7* 18 23 25*  CREATININE 0.70 0.70 0.62 0.63 0.68  CALCIUM 9.4 9.3 9.3 8.5* 8.9  MG 1.7 2.3  --  1.9 2.1     GFR: Estimated Creatinine Clearance: 47.1 mL/min (by C-G formula based on SCr of 0.68 mg/dL).  Liver Function Tests: Recent Labs  Lab 03/05/21 1645   AST 25  ALT 17  ALKPHOS 117  BILITOT 0.7  PROT 5.8*  ALBUMIN 3.3*     CBG: Recent Labs  Lab 03/10/21 1720 03/10/21 2158 03/11/21 0738 03/11/21 1202 03/11/21 1655  GLUCAP 117* 292* 171* 245* 245*      Recent Results (from the past 240 hour(s))  Resp Panel by RT-PCR (Flu A&B, Covid) Nasopharyngeal Swab     Status: None   Collection Time: 03/05/21  6:03 PM   Specimen: Nasopharyngeal Swab; Nasopharyngeal(NP) swabs in vial transport medium  Result Value Ref Range Status   SARS Coronavirus 2 by RT PCR NEGATIVE NEGATIVE Final    Comment: (NOTE) SARS-CoV-2 target nucleic acids are NOT DETECTED.  The SARS-CoV-2 RNA is generally detectable in upper respiratory specimens during the acute phase of infection. The lowest concentration of SARS-CoV-2 viral copies this assay can detect is 138 copies/mL. A negative result does not preclude SARS-Cov-2 infection and should not be used as the sole basis for treatment or other patient management decisions. A negative result may occur with  improper specimen collection/handling, submission of specimen other than nasopharyngeal swab, presence of viral mutation(s) within the areas targeted by this assay, and inadequate number of viral copies(<138 copies/mL). A negative result must be combined with clinical observations, patient history, and epidemiological information. The expected result is Negative.  Fact Sheet for Patients:  EntrepreneurPulse.com.au  Fact Sheet for Healthcare Providers:  IncredibleEmployment.be  This test is no t yet approved or cleared by the [place] FDA and  has been authorized for detection and/or diagnosis of SARS-CoV-2 by FDA under an Emergency Use Authorization (EUA). This EUA will remain  in effect (meaning this test can be used) for the duration of the COVID-19 declaration under Section 564(b)(1) of the Act, 21 U.S.C.section 360bbb-3(b)(1), unless the authorization  is terminated  or revoked sooner.       Influenza A by PCR NEGATIVE NEGATIVE Final   Influenza B by PCR NEGATIVE NEGATIVE Final    Comment: (NOTE) The Xpert Xpress SARS-CoV-2/FLU/RSV plus assay is intended as an aid in the diagnosis of influenza from Nasopharyngeal swab specimens and should not be used as a sole basis for treatment. Nasal washings and aspirates are unacceptable for Xpert Xpress SARS-CoV-2/FLU/RSV testing.  Fact Sheet for Patients: EntrepreneurPulse.com.au  Fact Sheet for Healthcare Providers: IncredibleEmployment.be  This test is not yet approved or cleared by the [place] FDA and has been authorized for detection and/or diagnosis of SARS-CoV-2 by FDA under an Emergency Use Authorization (EUA). This EUA will remain in effect (meaning this test can be used) for the duration of the COVID-19 declaration under Section 564(b)(1) of the Act, 21 U.S.C. section 360bbb-3(b)(1), unless the authorization is terminated or revoked.  Performed at [facility], [place] [address] [place], [place] [address]   MRSA Next Gen by PCR, Nasal     Status: None   Collection Time: 03/05/21 10:25 PM  Specimen: Nasal Mucosa; Nasal Swab  Result Value Ref Range Status   MRSA by PCR Next Gen NOT DETECTED NOT DETECTED Final    Comment: (NOTE) The GeneXpert MRSA Assay (FDA approved for NASAL specimens only), is one component of a comprehensive MRSA colonization surveillance program. It is not intended to diagnose MRSA infection nor to guide or monitor treatment for MRSA infections. Test performance is not FDA approved in patients less than 69 years old. Performed at [facility], [place] [address], [place], [place] [address]           Radiology Studies: No results found.      Scheduled Meds:  amitriptyline  50 mg Oral QHS   atorvastatin  40 mg Oral QHS   dexamethasone (DECADRON) injection  4 mg Intravenous Q6H   famotidine   20 mg Oral Daily   feeding supplement  237 mL Oral Q24H   feeding supplement (GLUCERNA SHAKE)  237 mL Oral Q24H   insulin aspart  0-15 Units Subcutaneous TID WC   levETIRAcetam  500 mg Oral BID   mouth rinse  15 mL Mouth Rinse BID   multivitamin with minerals  1 tablet Oral Daily   vitamin B-12  1,000 mcg Oral Daily   Continuous Infusions:  0.9 % NaCl with KCl 20 mEq / L 60 mL/hr at 03/10/21 2238     LOS: 6 days    Time spent: 35 minutes    Irine Seal, MD [facility]   To contact the attending provider between 7A-7P or the covering provider during after hours 7P-7A, please log into the web site [email or web address] and access using universal [place] password for that web site. If you do not have the password, please call the hospital operator.  03/11/2021, 5:31 PM

## 2021-03-11 NOTE — Plan of Care (Signed)
  Problem: Education: Goal: Knowledge of General Education information will improve Description: Including pain rating scale, medication(s)/side effects and non-pharmacologic comfort measures Outcome: Progressing   Problem: Health Behavior/Discharge Planning: Goal: Ability to manage health-related needs will improve Outcome: Progressing   Problem: Clinical Measurements: Goal: Ability to maintain clinical measurements within normal limits will improve Outcome: Progressing Goal: Will remain free from infection Outcome: Progressing Goal: Diagnostic test results will improve Outcome: Progressing Goal: Cardiovascular complication will be avoided Outcome: Progressing   Problem: Nutrition: Goal: Adequate nutrition will be maintained Outcome: Progressing   Problem: Pain Managment: Goal: General experience of comfort will improve Outcome: Progressing   Problem: Safety: Goal: Ability to remain free from injury will improve Outcome: Progressing   Problem: Skin Integrity: Goal: Risk for impaired skin integrity will decrease Outcome: Progressing   Problem: Safety: Goal: Non-violent Restraint(s) Outcome: Progressing

## 2021-03-12 ENCOUNTER — Ambulatory Visit
Admit: 2021-03-12 | Discharge: 2021-03-12 | Disposition: A | Discharge status: Home / Self Care | Payer: PPO | Attending: Radiation Oncology | Admitting: Radiation Oncology

## 2021-03-12 DIAGNOSIS — C3490 Malignant neoplasm of unspecified part of unspecified bronchus or lung: Secondary | ICD-10-CM | POA: Diagnosis not present

## 2021-03-12 DIAGNOSIS — I619 Nontraumatic intracerebral hemorrhage, unspecified: Secondary | ICD-10-CM | POA: Diagnosis not present

## 2021-03-12 DIAGNOSIS — Z7901 Long term (current) use of anticoagulants: Secondary | ICD-10-CM | POA: Diagnosis not present

## 2021-03-12 DIAGNOSIS — C7931 Secondary malignant neoplasm of brain: Secondary | ICD-10-CM | POA: Diagnosis not present

## 2021-03-12 DIAGNOSIS — G9340 Encephalopathy, unspecified: Secondary | ICD-10-CM | POA: Diagnosis present

## 2021-03-12 DIAGNOSIS — C3431 Malignant neoplasm of lower lobe, right bronchus or lung: Secondary | ICD-10-CM | POA: Diagnosis not present

## 2021-03-12 LAB — BASIC METABOLIC PANEL
Anion gap: 6 (ref 5–15)
BUN: 35 mg/dL — ABNORMAL HIGH (ref 8–23)
CO2: 26 mmol/L (ref 22–32)
Calcium: 8.9 mg/dL (ref 8.9–10.3)
Chloride: 106 mmol/L (ref 98–111)
Creatinine, Ser: 0.71 mg/dL (ref 0.44–1.00)
GFR, Estimated: >60 mL/min (ref >60)
Glucose, Bld: 191 mg/dL — ABNORMAL HIGH (ref 70–99)
Potassium: 4.2 mmol/L (ref 3.5–5.1)
Sodium: 138 mmol/L (ref 135–145)

## 2021-03-12 LAB — GLUCOSE, CAPILLARY
Glucose-Capillary: 157 mg/dL — ABNORMAL HIGH (ref 70–99)
Glucose-Capillary: 247 mg/dL — ABNORMAL HIGH (ref 70–99)
Glucose-Capillary: 171 mg/dL — ABNORMAL HIGH (ref 70–99)
Glucose-Capillary: 213 mg/dL — ABNORMAL HIGH (ref 70–99)

## 2021-03-12 NOTE — Assessment & Plan Note (Signed)
Continue statin. 

## 2021-03-12 NOTE — Assessment & Plan Note (Signed)
Medial coccyx stage I.  Present on admission. Continue foam dressing.

## 2021-03-12 NOTE — Progress Notes (Signed)
Occupational Therapy Treatment Patient Details Name: Katelyn Kennedy MRN: [id number] DOB: 07/10/1945 Today's Date: 03/12/2021   History of present illness 75 year old female adm through [facility] ED 11/21 with complaints of worsening left sided weakness, new onset delirium and visual hallucinations. CT head demonstrated progression of metastatic lung cancer with multiple hemorrhagic lesions and vasogenic edema. Notably she is on Eliquis for DVT. Neurosurgery consulted and did not recommend surgical intervention at this time.  PMH:HTN, arthritis, DM,. 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   OT comments  Patient continues to progress toward patient focused goals.  Discharge plan updated to home with 24 hour assist as needed and [place] OT.  Patient up in the recliner, but needing up to [facility] for generalized mobility in the room/toileting at RW level.  Patient able to perform stand grooming with setup, and requesting female for ADL setup sink side.  Nurse Tech stating setup and generalized supervision for ADL this date.  OT to continue efforts in the acute setting to maximize functional status.  Patient states she is able to arrange 24 hour assist as needed at home, and the plan is home with assist as needed.     Recommendations for follow up therapy are one component of a multi-disciplinary discharge planning process, led by the attending physician.  Recommendations may be updated based on patient status, additional functional criteria and insurance authorization.    Follow Up Recommendations  Home health OT    Assistance Recommended at Discharge Frequent or constant Supervision/Assistance  Equipment Recommendations  Tub/shower seat;BSC/3in1    Recommendations for Other Services      Precautions / Restrictions Precautions Precautions:  Fall Restrictions Weight Bearing Restrictions: No       Mobility Bed Mobility               General bed mobility comments: up in the recliner    Transfers Overall transfer level: Needs assistance Equipment used: Rolling walker (2 wheels) Transfers: Sit to/from Stand Sit to Stand: Supervision           General transfer comment: increased tme and cueing needing     Balance Overall balance assessment: Needs assistance Sitting-balance support: Feet supported Sitting balance-Leahy Scale: Good     Standing balance support: Reliant on assistive device for balance Standing balance-Leahy Scale: Fair Standing balance comment: continues to use RW for in room mobility.                           ADL either performed or assessed with clinical judgement   ADL       Grooming: Wash/dry hands;Standing;Cueing for sequencing;Cueing for safety;Supervision/safety           Upper Body Dressing : Set up;Sitting   Lower Body Dressing: Min guard;Sit to/from stand   Toilet Transfer: Nature conservation officer;Ambulation;Rolling walker (2 wheels) Toilet Transfer Details (indicate cue type and reason): patient continues to bump into objects on left visual field. Toileting- Water quality scientist and Hygiene: Min guard;Sit to/from stand       Functional mobility during ADLs: Min guard;Cueing for safety;Rolling walker (2 wheels) General ADL Comments: patient requests female for UB/LB ADL sinksid ethis date.  NT able to assist.  [facility] set behind her and supplies setup.    Extremity/Trunk Assessment  Vision Baseline Vision/History: 1 Wears glasses     Perception     Praxis Praxis Praxis: Impaired Praxis Impairment Details: Motor planning;Ideomotor;Perseveration    Cognition Arousal/Alertness: Awake/alert Behavior During Therapy: WFL for tasks assessed/performed                           Following Commands: Follows multi-step commands  with increased time     Problem Solving: Difficulty sequencing;Requires verbal cues;Slow processing;Decreased initiation                              Pertinent Vitals/ Pain       Pain Assessment: No/denies pain                                                          Frequency  Min 2X/week        Progress Toward Goals  OT Goals(current goals can now be found in the care plan section)  Progress towards OT goals: Progressing toward goals  Acute Rehab OT Goals Patient Stated Goal: hoping to return home with assist as needed OT Goal Formulation: With patient Time For Goal Achievement: 03/23/21 Potential to Achieve Goals: Good  Plan Discharge plan needs to be updated    Co-evaluation                 AM-PAC OT "6 Clicks" Daily Activity     Outcome Measure   Help from another person eating meals?: None Help from another person taking care of personal grooming?: A Little Help from another person toileting, which includes using toliet, bedpan, or urinal?: A Little Help from another person bathing (including washing, rinsing, drying)?: A Little Help from another person to put on and taking off regular upper body clothing?: A Little Help from another person to put on and taking off regular lower body clothing?: A Little 6 Click Score: 19    End of Session Equipment Utilized During Treatment: Rolling walker (2 wheels)  OT Visit Diagnosis: Unsteadiness on feet (R26.81);Other symptoms and signs involving cognitive function;Other symptoms and signs involving the nervous system (R29.898);Muscle weakness (generalized) (M62.81);History of falling (Z91.81)   Activity Tolerance Patient tolerated treatment well   Patient Left in chair;with nursing/sitter in room   Nurse Communication          Time: 0935-0958 OT Time Calculation (min): 23 min  Charges: OT General Charges $OT Visit: 1 Visit OT Treatments $Self Care/Home Management :  23-37 mins  03/12/2021  RP, OTR/L  Acute Rehabilitation Services  Office:  [phone]   Richard D Popella 03/12/2021, 10:09 AM 

## 2021-03-12 NOTE — Assessment & Plan Note (Signed)
Secondary to hemorrhage. No evidence of metabolic component. Monitor.  Improved.

## 2021-03-12 NOTE — Hospital Course (Addendum)
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 9/22 acute DVT. 03/03/2021 admitted to the hospital under ICU.  Neurosurgery consulted radiation oncology consulted. 11/25 transferred to [facility] for ongoing radiation therapy. 11/27 transferred to hospitalist service. Date of completion of radiation therapy 12/7

## 2021-03-12 NOTE — Assessment & Plan Note (Signed)
Hemoglobin A1c 4.9. Diet controlled. Steroid-induced hyperglycemia. Continue sliding scale insulin.

## 2021-03-12 NOTE — Assessment & Plan Note (Signed)
See intraparenchymal hemorrhage

## 2021-03-12 NOTE — Assessment & Plan Note (Signed)
Blood pressure stable.  Continue current regimen.

## 2021-03-12 NOTE — Assessment & Plan Note (Signed)
Stage IV non-small cell lung cancer. SP chemoradiation to the lung. Appears to have brain metastasis SP stereotactic radiosurgery in August. Oncology following.

## 2021-03-12 NOTE — Assessment & Plan Note (Signed)
Presented with left-sided weakness.  CT head shows evidence of progression of metastatic disease with new hemorrhagic lesions along with vasogenic edema and midline shift of 0.8 cm. MRI brain also shows dural spread of the tumor. MRI shows worsening midline shift of 1.3 cm. The small volume subarachnoid and subdural hemorrhage as well. Neurosurgery consulted.  Recommend close monitoring.  Hold anticoagulation.  And Decadron 4 mg every 6 hours.  Patient was started on Keppra for seizure prophylaxis. No intervention recommended by neurosurgery. Radiation oncology was consulted patient started on whole brain radiation therapy. Completion date 12/7. Patient will complete therapy in the hospital. Medical oncology also consulted. Steroid management and taper per radiation oncology.

## 2021-03-12 NOTE — Progress Notes (Signed)
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 9/22 acute DVT. 03/03/2021 admitted to the hospital under ICU.  Neurosurgery consulted radiation oncology consulted. 11/25 transferred to [facility] for ongoing radiation therapy. 11/27 transferred to hospitalist service. Anticipated date of completion of radiation therapy 12/7    Assessment and Plan: * Intraparenchymal hemorrhage of brain ([place]) Presented with left-sided weakness.  CT head shows evidence of progression of metastatic disease with new hemorrhagic lesions along with vasogenic edema and midline shift of 0.8 cm. MRI brain also shows dural spread of the tumor. MRI shows worsening midline shift of 1.3 cm. The small volume subarachnoid and subdural hemorrhage as well. Neurosurgery consulted.  Recommend close monitoring.  Hold anticoagulation.  And Decadron 4 mg every 6 hours.  Patient was started on Keppra for seizure prophylaxis. No intervention recommended by neurosurgery. Radiation oncology was consulted patient started on whole brain radiation therapy. Completion date 12/7. Patient will complete therapy in the hospital. Medical oncology also consulted. Steroid management and taper per radiation oncology.  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 SP chemoradiation to the lung. Appears to have brain metastasis SP stereotactic radiosurgery in August. Oncology following.   Hypertension Blood pressure stable.  Continue current regimen.  Acute encephalopathy Secondary to hemorrhage. No  evidence of metabolic component. Monitor.  Improved.  Chronic anticoagulation History of DVT in September 22. Eliquis on hold secondary to bleeding. Dopplers negative for DVT. Follow hematology recommendation regarding resumption of anticoagulation.  Hyperlipidemia associated with type 2 diabetes mellitus (HCC) Continue statin.  Diabetes mellitus with no complication (HCC) Hemoglobin A1c 4.9. Diet controlled. Steroid-induced hyperglycemia. Continue sliding scale insulin.  Pressure injury of skin Medial coccyx stage I.  Present on admission. Continue foam dressing.    Body mass index is 27.39 kg/m.  Nutrition Problem: Increased nutrient needs Etiology: acute illness, cancer and cancer related treatments, catabolic illness Pressure Injury 03/06/21 Coccyx Medial Stage 1 -  Intact skin with non-blanchable redness of a localized area usually over a bony prominence. red area with small amount that does not blanch (Active)  03/06/21 2030  Location: Coccyx  Location Orientation: Medial  Staging: Stage 1 -  Intact skin with non-blanchable redness of a localized area usually over a bony prominence.  Wound Description (Comments): red area with small amount that does not blanch  Present on Admission: Yes     Subjective: Denies any acute complaint.  No nausea no vomiting.  No fever no chills.  No headache.  Objective: Vital signs were reviewed and unremarkable.  Exam: General: Appear in mild distress, no Rash; Oral Mucosa Clear, moist. no Abnormal Neck Mass Or lumps, Conjunctiva normal  Cardiovascular: S1 and S2 Present, no Murmur, Respiratory: good respiratory effort, Bilateral Air entry present and CTA, no Crackles, no wheezes Abdomen: Bowel Sound present, Soft and no tenderness Extremities: no Pedal edema Neurology: alert and oriented to time, place, and person affect appropriate. no new focal deficit, chronic left-sided mild weakness Gait not checked due to patient safety  concerns     Data Reviewed: My review of labs, imaging, notes and other tests shows  no new significant findings.   Disposition:  Status is: Inpatient  Remains inpatient appropriate because: Need for emergent radiation therapy for intracranial hemorrhage for the brain metastasis, will complete therapy on 12/7.   Family Communication: None at bedside.  DVT Prophylaxis: Place and maintain sequential compression device Start: 03/08/21 1727 SCDs Start: 03/05/21 2052   Time spent: 35 minutes.   Author: Berle Mull  03/12/2021 6:30 PM  To reach On-call, see care teams to locate the attending and reach out via [email or web address]. Between 7PM-7AM, please contact night-coverage If you still have difficulty reaching the attending provider, please page the [facility] (Director on Call) for [facility] on amion for assistance.

## 2021-03-12 NOTE — Assessment & Plan Note (Signed)
History of DVT in September 22. Eliquis on hold secondary to bleeding. Dopplers negative for DVT. Follow hematology recommendation regarding resumption of anticoagulation.

## 2021-03-13 ENCOUNTER — Other Ambulatory Visit: Payer: PPO

## 2021-03-13 ENCOUNTER — Ambulatory Visit
Admission: RE | Admit: 2021-03-13 | Discharge: 2021-03-13 | Disposition: A | Discharge status: Home / Self Care | Payer: PPO | Source: Ambulatory Visit | Attending: Radiation Oncology | Admitting: Radiation Oncology

## 2021-03-13 DIAGNOSIS — Z7901 Long term (current) use of anticoagulants: Secondary | ICD-10-CM | POA: Diagnosis not present

## 2021-03-13 DIAGNOSIS — C3490 Malignant neoplasm of unspecified part of unspecified bronchus or lung: Secondary | ICD-10-CM | POA: Diagnosis not present

## 2021-03-13 DIAGNOSIS — C7931 Secondary malignant neoplasm of brain: Secondary | ICD-10-CM | POA: Diagnosis not present

## 2021-03-13 DIAGNOSIS — I619 Nontraumatic intracerebral hemorrhage, unspecified: Secondary | ICD-10-CM | POA: Diagnosis not present

## 2021-03-13 LAB — GLUCOSE, CAPILLARY
Glucose-Capillary: 261 mg/dL — ABNORMAL HIGH (ref 70–99)
Glucose-Capillary: 264 mg/dL — ABNORMAL HIGH (ref 70–99)
Glucose-Capillary: 174 mg/dL — ABNORMAL HIGH (ref 70–99)
Glucose-Capillary: 261 mg/dL — ABNORMAL HIGH (ref 70–99)

## 2021-03-13 MED ORDER — INSULIN GLARGINE-YFGN 100 UNIT/ML ~~LOC~~ SOLN
7.0000 [IU] | Freq: Every day | SUBCUTANEOUS | Status: DC
Start: 2021-03-14 — End: 2021-03-15
  Administered 2021-03-14 – 2021-03-15 (×2): 7 [IU] via SUBCUTANEOUS
  Filled 2021-03-13 (×2): qty 0.07

## 2021-03-13 NOTE — Assessment & Plan Note (Addendum)
Blood pressure stable.   Holding antihypertensive regimen for now.

## 2021-03-13 NOTE — Assessment & Plan Note (Addendum)
Presented with left-sided weakness. CT head shows evidence of progression of metastatic disease with new hemorrhagic lesions along with vasogenic edema and midline shift of 0.8 cm. MRI brain also shows dural spread of the tumor, also shows worsening midline shift of 1.3 cm and small volume subarachnoid and subdural hemorrhage as well. Neurosurgery consulted.  Recommend close monitoring.  Hold anticoagulation.  And Decadron 4 mg every 6 hours.  Patient was started on Keppra for seizure prophylaxis.  Completed 7-day therapy. No intervention recommended by neurosurgery. Radiation oncology was consulted, patient started on whole brain radiation therapy. Completion date 12/7. Patient will complete therapy in the hospital. Steroid management and taper per radiation oncology. Medical oncology also consulted.

## 2021-03-13 NOTE — Assessment & Plan Note (Addendum)
Secondary to hemorrhage. No evidence of metabolic component. Improved.

## 2021-03-13 NOTE — Assessment & Plan Note (Signed)
Continue statin. 

## 2021-03-13 NOTE — Assessment & Plan Note (Addendum)
See intraparenchymal hemorrhage. Decadron taper 4 mg TID the day after completes brain XRT for 5 days, then 4mg  BID for 5 days, 4mg  daily for 5 days, 2mg  daily for 5 days, then 2mg  every other day for 6 days

## 2021-03-13 NOTE — Assessment & Plan Note (Addendum)
History of DVT in September 22. Eliquis on hold secondary to bleeding. Dopplers negative for DVT. Currently oncology recommended to stay completely off of the anticoagulation.

## 2021-03-13 NOTE — Assessment & Plan Note (Signed)
See intraparenchymal hemorrhage

## 2021-03-13 NOTE — Progress Notes (Signed)
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 9/22 acute DVT. 03/03/2021 admitted to the hospital under ICU.  Neurosurgery consulted radiation oncology consulted. 11/25 transferred to [facility] for ongoing radiation therapy. 11/27 transferred to hospitalist service. Anticipated date of completion of radiation therapy 12/7   Assessment and Plan: * Intraparenchymal hemorrhage of brain ([place]) Presented with left-sided weakness.  CT head shows evidence of progression of metastatic disease with new hemorrhagic lesions along with vasogenic edema and midline shift of 0.8 cm. MRI brain also shows dural spread of the tumor. MRI shows worsening midline shift of 1.3 cm. The small volume subarachnoid and subdural hemorrhage as well. Neurosurgery consulted.  Recommend close monitoring.  Hold anticoagulation.  And Decadron 4 mg every 6 hours.  Patient was started on Keppra for seizure prophylaxis. No intervention recommended by neurosurgery. Radiation oncology was consulted patient started on whole brain radiation therapy. Completion date 12/7. Patient will complete therapy in the hospital. Medical oncology also consulted. Steroid management and taper per radiation oncology.  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 SP chemoradiation to the lung. Appears to have brain metastasis SP stereotactic radiosurgery in August. Oncology following.   Hypertension Blood pressure stable.  Continue current regimen.  Acute encephalopathy Secondary to hemorrhage. No  evidence of metabolic component. Monitor.  Improved.  Chronic anticoagulation History of DVT in September 22. Eliquis on hold secondary to bleeding. Dopplers negative for DVT. Currently cardiology recommended to stay off of the anticoagulation.  Hyperlipidemia associated with type 2 diabetes mellitus (HCC) Continue statin.  Diabetes mellitus with no complication (HCC) Hemoglobin A1c 4.9. Diet controlled. Steroid-induced hyperglycemia. Continue sliding scale insulin.  Pressure injury of skin Medial coccyx stage I.  Present on admission. Continue foam dressing.    Body mass index is 27.91 kg/m.  Nutrition Problem: Increased nutrient needs Etiology: acute illness, cancer and cancer related treatments, catabolic illness Pressure Injury 03/06/21 Coccyx Medial Stage 1 -  Intact skin with non-blanchable redness of a localized area usually over a bony prominence. red area with small amount that does not blanch (Active)  03/06/21 2030  Location: Coccyx  Location Orientation: Medial  Staging: Stage 1 -  Intact skin with non-blanchable redness of a localized area usually over a bony prominence.  Wound Description (Comments): red area with small amount that does not blanch  Present on Admission: Yes     Subjective: No acute complaint.  No nausea or vomiting.  Objective: Vitals remaining stable.  Exam: Clear to auscultation. S1-S2 present Bowel sound present.  No tenderness. No edema.  Data Reviewed: There are no new results to review at this time.  Disposition:  Status is: Inpatient  Remains inpatient appropriate because: Patient will complete radiation in the hospital on 12/7.  Family Communication: None at bedside.  DVT Prophylaxis: Place and maintain sequential compression device Start: 03/08/21 1727 SCDs Start: 03/05/21 2052   Time spent: 35 minutes.   Author: Berle Mull  03/13/2021 8:00 PM  To reach On-call, see care teams to locate the attending and  reach out via [email or web address]. Between 7PM-7AM, please contact night-coverage If you still  have difficulty reaching the attending provider, please page the [facility] (Director on Call) for [facility] on amion for assistance.

## 2021-03-13 NOTE — Assessment & Plan Note (Signed)
Medial coccyx stage I.  Present on admission. Continue foam dressing.

## 2021-03-13 NOTE — Plan of Care (Signed)
  Problem: Education: Goal: Knowledge of General Education information will improve Description: Including pain rating scale, medication(s)/side effects and non-pharmacologic comfort measures Outcome: Progressing   Problem: Health Behavior/Discharge Planning: Goal: Ability to manage health-related needs will improve Outcome: Progressing   Problem: Clinical Measurements: Goal: Ability to maintain clinical measurements within normal limits will improve Outcome: Progressing Goal: Will remain free from infection Outcome: Progressing Goal: Diagnostic test results will improve Outcome: Progressing Goal: Cardiovascular complication will be avoided Outcome: Progressing   Problem: Activity: Goal: Risk for activity intolerance will decrease Outcome: Progressing   Problem: Nutrition: Goal: Adequate nutrition will be maintained Outcome: Progressing   Problem: Pain Managment: Goal: General experience of comfort will improve Outcome: Progressing   Problem: Safety: Goal: Ability to remain free from injury will improve Outcome: Progressing   Problem: Skin Integrity: Goal: Risk for impaired skin integrity will decrease Outcome: Progressing   Problem: Safety: Goal: Non-violent Restraint(s) Outcome: Progressing

## 2021-03-13 NOTE — Progress Notes (Signed)
HEMATOLOGY-ONCOLOGY PROGRESS NOTE  SUBJECTIVE: Katelyn Kennedy is followed by our office for metastatic non-small cell lung cancer, adenocarcinoma.  She has been on observation.  She presented to the hospital with left-sided weakness and imaging showed evidence for brain metastases.  Radiation oncology has been consulted and the patient is receiving whole brain radiation under the care of Dr. Sondra Come.   This morning, the patient is up walking around.  She offers no specific complaints today.  She continues on radiation.   Oncology History  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 ([place])  04/29/2017 Initial Diagnosis   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 ([place])   09/21/2017 -  Chemotherapy   The patient had durvalumab (IMFINZI) 600 mg in sodium chloride 0.9 % 100 mL chemo infusion, 10.5 mg/kg = 560 mg, Intravenous,  Once, 26 of 26 cycles Administration: 600 mg (09/21/2017), 600 mg (12/16/2017), 620 mg (12/30/2017), 620 mg (01/13/2018), 620 mg (01/27/2018), 620 mg (02/10/2018), 620 mg (02/23/2018), 600 mg (10/07/2017), 620 mg (03/09/2018), 600 mg (10/21/2017), 600 mg (11/04/2017), 600 mg (11/18/2017), 600 mg (12/02/2017), 620 mg (03/24/2018), 620 mg (04/05/2018), 620 mg (05/04/2018), 620 mg (04/21/2018), 620 mg (05/18/2018), 620 mg (06/01/2018), 620 mg (06/15/2018), 620 mg (06/29/2018), 620 mg (07/13/2018), 620 mg (07/27/2018), 620 mg (08/11/2018), 620 mg (08/24/2018), 620 mg (09/08/2018)   for chemotherapy treatment.     05/14/2020 Cancer Staging   Staging form: Lung, AJCC 8th Edition - Clinical: Stage IIIA (cT2a, cN2, cM0) - Signed by Curt Bears, MD on 05/14/2020       REVIEW OF SYSTEMS:   Constitutional: Denies fevers, chillss Eyes: Denies blurriness of vision Ears, nose, mouth, throat, and face: Denies mucositis or sore throat Respiratory: Denies cough, dyspnea or wheezes Cardiovascular: Denies palpitation, chest discomfort Gastrointestinal:  Denies nausea, heartburn or change in  bowel habits Skin: Denies abnormal skin rashes Lymphatics: Denies new lymphadenopathy or easy bruising Neurological:Denies numbness, tingling or new weaknesses Behavioral/Psych: Mood is stable, no new changes  Extremities: No lower extremity edema All other systems were reviewed with the patient and are negative.  I have reviewed the past medical history, past surgical history, social history and family history with the patient and they are unchanged from previous note.   PHYSICAL EXAMINATION: ECOG PERFORMANCE STATUS: 1 - Symptomatic but completely ambulatory  Vitals:   03/12/21 1502 03/12/21 2040  BP: 119/81 118/66  Pulse: 82 (!) 105  Resp: 20 20  Temp: 98.6 F (37 C) 97.6 F (36.4 C)  SpO2: 96% 97%   Filed Weights   03/11/21 0500 03/12/21 0505 03/13/21 0500  Weight: 62 kg 61.5 kg 62.7 kg    Intake/Output from previous day: 11/30 0701 - 12/01 0700 In: 948 [P.O.:948] Out: -   GENERAL:alert, no distress and comfortable SKIN: skin color, texture, turgor are normal, no rashes or significant lesions EYES: normal, Conjunctiva are pink and non-injected, sclera clear LUNGS: clear to auscultation and percussion with normal breathing effort HEART: regular rate & rhythm and no murmurs and no lower extremity edema ABDOMEN:abdomen soft, non-tender and normal bowel sounds NEURO: alert & oriented x 3 with fluent speech, no focal motor/sensory deficits  LABORATORY DATA:  I have reviewed the data as listed CMP Latest Ref Rng & Units 03/12/2021 03/10/2021 03/09/2021  Glucose 70 - 99 mg/dL 191(H) 172(H) 163(H)  BUN 8 - 23 mg/dL 35(H) 25(H) 23  Creatinine 0.44 - 1.00 mg/dL 0.71 0.68 0.63  Sodium 135 - 145 mmol/L 138 140 137  Potassium 3.5 -  5.1 mmol/L 4.2 4.4 4.0  Chloride 98 - 111 mmol/L 106 110 109  CO2 22 - 32 mmol/L 26 26 24   Calcium 8.9 - 10.3 mg/dL 8.9 8.9 8.5(L)  Total Protein 6.5 - 8.1 g/dL - - -  Total Bilirubin 0.3 - 1.2 mg/dL - - -  Alkaline Phos 38 - 126 U/L - - -   AST 15 - 41 U/L - - -  ALT 0 - 44 U/L - - -    Lab Results  Component Value Date   WBC 8.0 03/10/2021   HGB 11.7 (L) 03/10/2021   HCT 35.4 (L) 03/10/2021   MCV 92.7 03/10/2021   PLT 268 03/10/2021   NEUTROABS 6.4 03/10/2021    CT HEAD WO CONTRAST (5MM)  Result Date: 03/05/2021 CLINICAL DATA:  Metastatic right lung cancer, brain metastases EXAM: CT HEAD WITHOUT CONTRAST TECHNIQUE: Contiguous axial images were obtained from the base of the skull through the vertex without intravenous contrast. COMPARISON:  12/24/2020, 11/27/2020 FINDINGS: Brain: Postsurgical changes are again noted from right frontoparietal craniotomy. Suspected recurrent metastatic disease along the right frontal convexity at the gray-white interface measuring up to 1.1 x 0.8 cm reference image 25/3. Mild surrounding vasogenic edema. Multiple new hemorrhagic foci are seen throughout the left frontal, right frontal, right temporal, right parietal regions consistent with progressive metastatic lung cancer. Hemorrhagic metastatic deposits in the right frontal lobe measure up to 1.8 x 1.0 cm reference image 15/3. Left frontal hemorrhagic focus measures up to 1.3 x 0.8 cm. Significant surrounding vasogenic edema. There is leftward midline shift, measuring up to 8 mm at the level of the septum pellucidum. Smaller hemorrhagic foci within the right frontotemporal region image 20/3 measures 8 mm, and in the right temporoparietal region image 16 measures 0.9 cm. On the reconstructed images, multiple areas of high attenuation are seen within the dura, deep to the craniotomy site as well as along the right frontal convexity, concerning for dural spread of disease. Vascular: No hyperdense vessel or unexpected calcification. Skull: Postsurgical changes from right frontal parietal craniotomy. No acute fractures. Sinuses/Orbits: No acute finding. Other: None. IMPRESSION: 1. Marked progression of metastatic lung cancer, with multiple new  hemorrhagic metastatic lesions as above. Significant right frontal vasogenic edema results in leftward midline shift measuring approximately 0.8 cm. Critical Value/emergent results were called by telephone at the time of interpretation on 03/05/2021 at 5:37 pm to provider Regan Lemming , who verbally acknowledged these results. Electronically Signed   By: Randa Ngo M.D.   On: 03/05/2021 17:37   MR BRAIN W WO CONTRAST  Result Date: 03/06/2021 CLINICAL DATA:  Mental status change. New onset delirium and visual hallucinations. Worsening left-sided weakness. History of non-small cell lung cancer with brain metastases. EXAM: MRI HEAD WITHOUT AND WITH CONTRAST TECHNIQUE: Multiplanar, multiecho pulse sequences of the brain and surrounding structures were obtained without and with intravenous contrast. CONTRAST:  71mL GADAVIST GADOBUTROL 1 MMOL/ML IV SOLN COMPARISON:  Head CT 03/05/2021 and MRI 11/27/2020 FINDINGS: Brain: Sequelae of right frontal craniotomy are again identified. A resection cavity containing blood products in the high posterior right frontal lobe has decreased in size from the prior MRI. Enhancement along the margins of the resection cavity is greatest laterally and inferiorly, measuring up to 5 mm in thickness with direct comparison to the prior MRI limited due to diffuse T1 hyperintense blood products in the cavity on the prior study as well as interval cavity contraction. There is new diffuse dural enhancement over the right cerebral  convexity with numerous areas of nodularity including thick, nodular enhancement over the anterior aspect of the right frontal lobe including along the floor of the anterior cranial fossa. Nodular dural enhancement also extends along the falx, greatest anteriorly. Heterogeneously enhancing intra-axial tumor anteriorly in the right frontal lobe measures 2.1 x 1.9 cm and extends medially to abut the falx. Similar tumor anteriorly in the left frontal lobe measures 1.8  x 1.1 cm. Both the intra-axial and dural-based tumor components are hemorrhagic. There is new extensive vasogenic edema anteriorly in the right frontal lobe which extends into the corpus callosum and results in 1.3 cm of leftward midline shift. There is milder vasogenic edema in the anterior left frontal lobe, and there are also small areas of vasogenic edema scattered elsewhere in the right frontal, right parietal, and right occipital lobes. FLAIR hyperintensity involving multiple right cerebral sulci, predominantly towards the vertex as well as medially along the interhemispheric fissure could reflect a small amount of subarachnoid hemorrhage, proteinaceous material, or tumor although there is not a significant amount of leptomeningeal enhancement in these regions to clearly indicate the presence of tumor. There could be trace subdural hemorrhage over the right cerebral convexity as well. No acute large territory infarct is present. Vascular: Major intracranial vascular flow voids are preserved. Skull and upper cervical spine: Right frontal craniotomy. No suspicious marrow lesion. Sinuses/Orbits: Bilateral cataract extraction. Paranasal sinuses and mastoid air cells are clear. Other: None. IMPRESSION: 1. Marked progression of intracranial metastatic disease since 11/27/2020. This is largely dural spread of tumor over the right cerebral hemisphere with some intraparenchymal components, most notably in the anterior right frontal lobe where there is extensive vasogenic edema and 1.3 cm of leftward midline shift. 2. Possible small volume subarachnoid and subdural hemorrhage. Electronically Signed   By: Logan Bores M.D.   On: 03/06/2021 12:48   VAS [name] LOWER EXTREMITY VENOUS (DVT)  Result Date: 03/08/2021  Lower Venous DVT Study Patient Name:  Katelyn Kennedy  Date of Exam:   03/07/2021 Medical Rec #: [id number]        Accession #:    [id number] Date of Birth: 01/01/1946       Patient Gender: F Patient Age:   33  years Exam Location:  [facility] Procedure:      VAS [name] LOWER EXTREMITY VENOUS (DVT) Referring Phys: RAKESH ALVA --------------------------------------------------------------------------------  Indications: Previous DVT - follow up exam.  Risk Factors: Cancer - metastatic lung. Anticoagulation: Eliquis prior to admission. Comparison Study: Previous exam on 12/13/2020 positive for RLE (FV, PopV, & PTV) &                   LLE (gastroc) DVTs Performing Technologist: Jody Hill RVT, RDMS  Examination Guidelines: A complete evaluation includes B-mode imaging, spectral Doppler, color Doppler, and power Doppler as needed of all accessible portions of each vessel. Bilateral testing is considered an integral part of a complete examination. Limited examinations for reoccurring indications may be performed as noted. The reflux portion of the exam is performed with the patient in reverse Trendelenburg.  +---------+---------------+---------+-----------+----------+--------------+ RIGHT    CompressibilityPhasicitySpontaneityPropertiesThrombus Aging +---------+---------------+---------+-----------+----------+--------------+ CFV      Full           Yes      Yes                                 +---------+---------------+---------+-----------+----------+--------------+ SFJ      Full                                                        +---------+---------------+---------+-----------+----------+--------------+  FV Prox  Full           Yes      Yes                                 +---------+---------------+---------+-----------+----------+--------------+ FV Mid   Full           Yes      Yes                                 +---------+---------------+---------+-----------+----------+--------------+ FV DistalFull           Yes      Yes                                 +---------+---------------+---------+-----------+----------+--------------+ PFV      Full                                                         +---------+---------------+---------+-----------+----------+--------------+ POP      Full           Yes      Yes                                 +---------+---------------+---------+-----------+----------+--------------+ PTV      Full                                                        +---------+---------------+---------+-----------+----------+--------------+ PERO     Full                                                        +---------+---------------+---------+-----------+----------+--------------+   +---------+---------------+---------+-----------+----------+--------------+ LEFT     CompressibilityPhasicitySpontaneityPropertiesThrombus Aging +---------+---------------+---------+-----------+----------+--------------+ CFV      Full           Yes      Yes                                 +---------+---------------+---------+-----------+----------+--------------+ SFJ      Full                                                        +---------+---------------+---------+-----------+----------+--------------+ FV Prox  Full           Yes      Yes                                 +---------+---------------+---------+-----------+----------+--------------+ FV Mid   Full  Yes      Yes                                 +---------+---------------+---------+-----------+----------+--------------+ FV DistalFull           Yes      Yes                                 +---------+---------------+---------+-----------+----------+--------------+ PFV      Full                                                        +---------+---------------+---------+-----------+----------+--------------+ POP      Full           Yes      Yes                                 +---------+---------------+---------+-----------+----------+--------------+ PTV      Full                                                         +---------+---------------+---------+-----------+----------+--------------+ PERO     Full                                                        +---------+---------------+---------+-----------+----------+--------------+     Summary: BILATERAL: - No evidence of deep vein thrombosis seen in the lower extremities, bilaterally. - No evidence of superficial venous thrombosis in the lower extremities, bilaterally. -No evidence of popliteal cyst, bilaterally.   *See table(s) above for measurements and observations. Electronically signed by Orlie Pollen on 03/08/2021 at 10:43:53 AM.    Final     ASSESSMENT AND PLAN: This is a very pleasant 75 year old white female with now metastatic non-small cell lung cancer, adenocarcinoma initially diagnosed as stage IIIA non-small cell lung cancer, adenocarcinoma presented mainly with right lower lobe lung mass in addition to subcarinal lymphadenopathy but there was suspicious metastatic pulmonary nodules. She developed metastatic disease to the brain in July 2022. The patient received a course of concurrent chemoradiation with weekly carboplatin and paclitaxel.  She tolerated her treatment well with no concerning complaints. The patient completed consolidation treatment with immunotherapy with Imfinzi (Durvalumab). She is status post 26 cycles. She tolerated it well without any adverse effects.  She has been on observation from medical oncology standpoint.  The patient underwent craniotomy and surgical resection of the solitary brain metastasis under the care of Dr. Trenton Gammon and the final pathology was consistent with metastatic adenocarcinoma of the lung.  She has now developed progressive brain mets.  She is receiving whole brain radiation.  She is on dexamethasone.  She will continue radiation under the care of of Dr. Sondra Come.  Dexamethasone to be tapered per Dr. Clabe Seal instructions.  From medical oncology standpoint, she had a  PET scan performed in September which  did not show any evidence of disease progression.  She will continue on observation from our standpoint.  She is due to have a restaging CT scan of the chest in January and then follow-up with [name] to discuss the results.  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  Her anticoagulation has been discontinued.  Doppler ultrasounds were negative for recurrent DVT.  We recommend that she continue off anticoagulation.   Future Appointments  Date Time Provider [place]  03/13/2021  2:45 PM [facility] LINAC 3 [facility] None  03/14/2021  4:30 PM [facility] LINAC 4 [facility] None  03/17/2021 11:30 AM Vaslow, Acey Lav, MD [facility] None  03/17/2021 12:00 PM [facility] LINAC 4 [facility] None  03/18/2021 12:15 PM [facility] LINAC 4 [facility] None  03/19/2021 12:15 PM [facility] [id number] [facility] None  04/28/2021 11:00 AM [facility] LAB [facility] None  04/28/2021 11:30 AM Curt Bears, MD [facility] None  08/21/2021 11:00 AM Stoioff, Ronda Fairly, MD [facility] None      LOS: 8 days   Mikey Bussing, DNP, AGPCNP-BC, AOCNP 03/13/21

## 2021-03-13 NOTE — Assessment & Plan Note (Addendum)
Stage IV non-small cell lung cancer. S/P chemoradiation to the lung. Appears to have brain metastasis SP stereotactic radiosurgery in August. Oncology following. Palliative care will be helpful for patient.

## 2021-03-13 NOTE — Assessment & Plan Note (Addendum)
Hemoglobin A1c 4.9. Diet controlled. Steroid-induced hyperglycemia. In the hospital treated with sliding scale insulin. Expectation is that sugar control will improve with tapering of Decadron.

## 2021-03-14 ENCOUNTER — Ambulatory Visit
Admit: 2021-03-14 | Discharge: 2021-03-14 | Disposition: A | Discharge status: Home / Self Care | Payer: PPO | Attending: Radiation Oncology | Admitting: Radiation Oncology

## 2021-03-14 DIAGNOSIS — Z7901 Long term (current) use of anticoagulants: Secondary | ICD-10-CM | POA: Diagnosis not present

## 2021-03-14 DIAGNOSIS — C7931 Secondary malignant neoplasm of brain: Secondary | ICD-10-CM | POA: Diagnosis not present

## 2021-03-14 DIAGNOSIS — I619 Nontraumatic intracerebral hemorrhage, unspecified: Secondary | ICD-10-CM | POA: Diagnosis not present

## 2021-03-14 DIAGNOSIS — C3490 Malignant neoplasm of unspecified part of unspecified bronchus or lung: Secondary | ICD-10-CM | POA: Diagnosis not present

## 2021-03-14 LAB — GLUCOSE, CAPILLARY
Glucose-Capillary: 201 mg/dL — ABNORMAL HIGH (ref 70–99)
Glucose-Capillary: 383 mg/dL — ABNORMAL HIGH (ref 70–99)
Glucose-Capillary: 137 mg/dL — ABNORMAL HIGH (ref 70–99)
Glucose-Capillary: 193 mg/dL — ABNORMAL HIGH (ref 70–99)

## 2021-03-14 NOTE — Progress Notes (Signed)
Nutrition Follow-up  INTERVENTION:   -D/c Glucerna shakes -D/c Vital Cuisine shakes on trays  -Encouraged continued good intakes  -Double protein portions on trays  NUTRITION DIAGNOSIS:   Increased nutrient needs related to acute illness, cancer and cancer related treatments, catabolic illness as evidenced by estimated needs.  GOAL:   Patient will meet greater than or equal to 90% of their needs  MONITOR:   PO intake, Supplement acceptance, Labs, Weight trends  REASON FOR ASSESSMENT:   Malnutrition Screening Tool    ASSESSMENT:   75 year old female with medical history of NSCLC with mets to brain, arthritis, DM, HTN, peripheral vascular disease, and tobacco use/abuse. She was admitted to [facility] in 10/2020 for resection of R posterior frontal dural-based met and has been off of cancer treatment since that time. She has ongoing L-sided weakness following resection. She presented to the ED on 11/21 with worsening L-sided weakness, new onset delirium and visual hallucinations. In the ED, CT head showed progression of metastatic lung cancer with multiple hemorrhagic lesions and vasogenic edema. Neurosurgery evaluated in the ED and did not see a need for any surgical intervention at that time.  Patient sitting up in chair, consumed 100% of her lunch tray. Reports very good appetite, per RN pt has been "cleaning her plates". Pt states she had good appetite PTA as well. RN expressed concern about elevated blood sugars. Pt receiving steroids which will elevate CBGs. Pt with increased needs given radiation treatments and severity of cancer. Pt denies issues with swallowing, chewing or taste. States she has some dry mouth.  Will d/c all supplements as pt is eating very well and not drinking these anyway.  Admission weight: 135 lbs Current weight: 138 lbs  Medications: Decadron, Pepcid, Multivitamin with minerals daily, Vitamin B-12  Labs reviewed:  CBGs: 174-383  NUTRITION -  FOCUSED PHYSICAL EXAM:  Flowsheet Row Most Recent Value  Orbital Region Mild depletion  Upper Arm Region Mild depletion  [left sided weakness from brain mets]  Thoracic and Lumbar Region Unable to assess  Buccal Region Mild depletion  Temple Region Mild depletion  Clavicle Bone Region No depletion  Clavicle and Acromion Bone Region No depletion  Scapular Bone Region No depletion  Dorsal Hand Moderate depletion  Patellar Region Unable to assess  Anterior Thigh Region Unable to assess  Posterior Calf Region Unable to assess  Edema (RD Assessment) None  Hair Reviewed  [thinning]  Eyes Reviewed  [reports "floaters"]  Mouth Reviewed  Skin Reviewed       Diet Order:   Diet Order             Diet regular Room service appropriate? Yes; Fluid consistency: Thin  Diet effective now                   EDUCATION NEEDS:   Not appropriate for education at this time  Skin:  Skin Assessment: Skin Integrity Issues: Skin Integrity Issues:: Stage I Stage I: coccyx  Last BM:  11/29 -type 1  Height:   Ht Readings from Last 1 Encounters:  03/06/21 4' 11" (1.499 m)    Weight:   Wt Readings from Last 1 Encounters:  03/13/21 62.7 kg    Ideal Body Weight:  42.9 kg  BMI:  Body mass index is 27.91 kg/m.  Estimated Nutritional Needs:   Kcal:  1700-1900 kcal  Protein:  90-105 grams  Fluid:  >/= 1.7 L/day   Clayton Bibles, MS, RD, LDN Inpatient Clinical Dietitian Contact  information available via Amion

## 2021-03-14 NOTE — Progress Notes (Signed)
Inpatient Diabetes Program Recommendations  AACE/ADA: New Consensus Statement on Inpatient Glycemic Control (2015)  Target Ranges:  Prepandial:   less than 140 mg/dL      Peak postprandial:   less than 180 mg/dL (1-2 hours)      Critically ill patients:  140 - 180 mg/dL   Lab Results  Component Value Date   GLUCAP 383 (H) 03/14/2021   HGBA1C 4.9 03/05/2021    Latest Reference Range & Units 03/13/21 08:12 03/13/21 11:54 03/13/21 16:45 03/13/21 21:33 03/14/21 07:47 03/14/21 11:27  Glucose-Capillary 70 - 99 mg/dL 261 (H) 264 (H) 174 (H) 261 (H) 201 (H) 383 (H)   Review of Glycemic Control  No Diabetes at baseline On Decadron 4 mg Q6 hours Current orders for Inpatient glycemic control:  Semglee 7 units Novolog 0-15 units tid  Inpatient Diabetes Program Recommendations:    -   Increase Semglee to 10 units -   Increase frequency of Novolog to Q4 -   May need Novolog 4 units tid meal coverage if eating >50% of meals   Thanks,  Tama Headings RN, MSN, BC-ADM Inpatient Diabetes Coordinator Team Pager [phone] (8a-5p)

## 2021-03-14 NOTE — Progress Notes (Signed)
[facility] PROGRESS NOTE  Patient: Katelyn Kennedy XEN:[id number]   PCP: [name], MD DOB: 11-23-1945   DOA: 03/05/2021   DOS: 03/14/2021    Subjective: No acute complaint no nausea no vomiting or no fever no chills.  Objective:  Vitals:   03/14/21 1246 03/14/21 1247  BP: (!) 136/57   Pulse: 95   Resp: 20   Temp:  98.2 F (36.8 C)  SpO2: 99%     Assessment and plan: Continue radiation therapy. Will discuss with neurosurgery regarding duration of Keppra medication.  Last full note 03/13/2021.  Author: Berle Mull, MD [facility] 03/14/2021 5:27 PM   If 7PM-7AM, please contact night-coverage at [email or web address]

## 2021-03-14 NOTE — Progress Notes (Signed)
The patient is injury-free, afebrile, alert, and oriented X 4. VS within baseline. She denies s/s of chest pain, dizziness, signs or symptoms of bleeding. No s/s of infection or acute changes noted during this shift. We will continue to monitor and work toward achieving the care plan goals

## 2021-03-14 NOTE — Plan of Care (Signed)

## 2021-03-14 NOTE — Care Management Important Message (Signed)
Important Message  Patient Details IM Letter given to the Patient. Name: Katelyn Kennedy MRN: [id number] Date of Birth: [birth year]   Medicare Important Message Given:  Yes     [name] 03/14/2021, 11:12 AM

## 2021-03-15 DIAGNOSIS — Z7901 Long term (current) use of anticoagulants: Secondary | ICD-10-CM | POA: Diagnosis not present

## 2021-03-15 DIAGNOSIS — C7931 Secondary malignant neoplasm of brain: Secondary | ICD-10-CM | POA: Diagnosis not present

## 2021-03-15 DIAGNOSIS — C3490 Malignant neoplasm of unspecified part of unspecified bronchus or lung: Secondary | ICD-10-CM | POA: Diagnosis not present

## 2021-03-15 DIAGNOSIS — I619 Nontraumatic intracerebral hemorrhage, unspecified: Secondary | ICD-10-CM | POA: Diagnosis not present

## 2021-03-15 LAB — CBC
HCT: 38.5 % (ref 36.0–46.0)
Hemoglobin: 12.8 g/dL (ref 12.0–15.0)
MCH: 31 pg (ref 26.0–34.0)
MCHC: 33.2 g/dL (ref 30.0–36.0)
MCV: 93.2 fL (ref 80.0–100.0)
Platelets: 190 10*3/uL (ref 150–400)
RBC: 4.13 MIL/uL (ref 3.87–5.11)
RDW: 12.7 % (ref 11.5–15.5)
WBC: 14.1 10*3/uL — ABNORMAL HIGH (ref 4.0–10.5)
nRBC: 0 % (ref 0.0–0.2)

## 2021-03-15 LAB — GLUCOSE, CAPILLARY
Glucose-Capillary: 212 mg/dL — ABNORMAL HIGH (ref 70–99)
Glucose-Capillary: 170 mg/dL — ABNORMAL HIGH (ref 70–99)
Glucose-Capillary: 192 mg/dL — ABNORMAL HIGH (ref 70–99)
Glucose-Capillary: 331 mg/dL — ABNORMAL HIGH (ref 70–99)

## 2021-03-15 LAB — BASIC METABOLIC PANEL
Anion gap: 9 (ref 5–15)
BUN: 36 mg/dL — ABNORMAL HIGH (ref 8–23)
CO2: 24 mmol/L (ref 22–32)
Calcium: 9 mg/dL (ref 8.9–10.3)
Chloride: 103 mmol/L (ref 98–111)
Creatinine, Ser: 0.71 mg/dL (ref 0.44–1.00)
GFR, Estimated: >60 mL/min (ref >60)
Glucose, Bld: 183 mg/dL — ABNORMAL HIGH (ref 70–99)
Potassium: 4.4 mmol/L (ref 3.5–5.1)
Sodium: 136 mmol/L (ref 135–145)

## 2021-03-15 MED ORDER — INSULIN GLARGINE-YFGN 100 UNIT/ML ~~LOC~~ SOLN
10.0000 [IU] | Freq: Every day | SUBCUTANEOUS | Status: DC
  Administered 2021-03-16 – 2021-03-19 (×4): 10 [IU] via SUBCUTANEOUS
  Filled 2021-03-15 (×4): qty 0.1

## 2021-03-15 MED ORDER — INSULIN ASPART 100 UNIT/ML IJ SOLN
3.0000 [IU] | Freq: Once | INTRAMUSCULAR | Status: AC
  Administered 2021-03-15: 3 [IU] via SUBCUTANEOUS

## 2021-03-15 NOTE — Progress Notes (Signed)
Physical Therapy Treatment Patient Details Name: Katelyn Kennedy MRN: [id number] DOB: [birth year] Today's Date: 03/15/2021   History of Present Illness 75 year old female adm through [facility] ED 11/21 with complaints of worsening left sided weakness, new onset delirium and visual hallucinations. CT head demonstrated progression of metastatic lung cancer with multiple hemorrhagic lesions and vasogenic edema. Notably she is on Eliquis for DVT. Neurosurgery consulted and did not recommend surgical intervention at this time.  PMH:HTN, arthritis, DM,. 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    PT Comments    Pt making gradual progress.  Demonstrated improved safety with rollator but did need cues for proximity.  Pt easily distracted and needs frequent cues to attend to task at hand.  Continue plan of care.    Recommendations for follow up therapy are one component of a multi-disciplinary discharge planning process, led by the attending physician.  Recommendations may be updated based on patient status, additional functional criteria and insurance authorization.  Follow Up Recommendations  Home health PT     Assistance Recommended at Discharge Frequent or constant Supervision/Assistance (due to cognition)  Equipment Recommendations  Rollator (4 wheels)    Recommendations for Other Services       Precautions / Restrictions Precautions Precautions: Fall Restrictions Weight Bearing Restrictions: No     Mobility  Bed Mobility Overal bed mobility: Needs Assistance Bed Mobility: Supine to Sit     Supine to sit: Min assist     General bed mobility comments: Min A to lift trunk; cues to scoot forward    Transfers Overall transfer level: Needs assistance Equipment used: Rolling walker (2 wheels) Transfers: Sit to/from Stand Sit to Stand:  Supervision           General transfer comment: Performed x 5 during session (from toilet and bed)    Ambulation/Gait Ambulation/Gait assistance: Min guard Gait Distance (Feet): 300 Feet Assistive device: Rollator (4 wheels) Gait Pattern/deviations: Step-through pattern;Decreased stride length Gait velocity: decreased     General Gait Details: Min guard for safety with cues for rollator proximity; did better avoiding objects with rollator vs RW   Stairs             Wheelchair Mobility    Modified Rankin (Stroke Patients Only)       Balance Overall balance assessment: Needs assistance Sitting-balance support: Feet supported Sitting balance-Leahy Scale: Good     Standing balance support: No upper extremity supported;Bilateral upper extremity supported;Single extremity supported Standing balance-Leahy Scale: Good Standing balance comment: Pt ambulated with rollator but performed ADLs in standing with varied UE support (1 vs none).  Steady and without LOB but frequent cues to attend to task                            Cognition Arousal/Alertness: Awake/alert   Overall Cognitive Status: No family/caregiver present to determine baseline cognitive functioning Area of Impairment: Attention;Following commands;Problem solving;Safety/judgement;Memory                   Current Attention Level: Sustained Memory: Decreased short-term memory Following Commands: Follows multi-step commands with increased time   Awareness: Emergent Problem Solving: Difficulty sequencing;Requires verbal cues;Slow processing;Decreased initiation General Comments: Pt easily distracted and often needs cues to redirect/attend to task        Exercises  General Comments General comments (skin integrity, edema, etc.): VSS      Pertinent Vitals/Pain Pain Assessment: No/denies pain    Home Living                          Prior Function            PT  Goals (current goals can now be found in the care plan section) Progress towards PT goals: Progressing toward goals    Frequency    Min 3X/week      PT Plan Current plan remains appropriate    Co-evaluation              AM-PAC PT "6 Clicks" Mobility   Outcome Measure  Help needed turning from your back to your side while in a flat bed without using bedrails?: A Little Help needed moving from lying on your back to sitting on the side of a flat bed without using bedrails?: A Little Help needed moving to and from a bed to a chair (including a wheelchair)?: A Little Help needed standing up from a chair using your arms (e.g., wheelchair or bedside chair)?: A Little Help needed to walk in hospital room?: A Little Help needed climbing 3-5 steps with a railing? : A Little 6 Click Score: 18    End of Session   Activity Tolerance: Patient tolerated treatment well Patient left: in chair;with call bell/phone within reach;with chair alarm set Nurse Communication: Mobility status PT Visit Diagnosis: Other abnormalities of gait and mobility (R26.89);Other symptoms and signs involving the nervous system (R29.898)     Time: 8830-1415 PT Time Calculation (min) (ACUTE ONLY): 37 min  Charges:  $Gait Training: 8-22 mins $Therapeutic Activity: 8-22 mins                     Abran Richard, PT Acute Rehab Services Pager [phone] [facility] [place] 03/15/2021, 1:00 PM

## 2021-03-15 NOTE — Progress Notes (Signed)
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 9/22 acute DVT. 03/03/2021 admitted to the hospital under ICU.  Neurosurgery consulted radiation oncology consulted. 11/25 transferred to [facility] for ongoing radiation therapy. 11/27 transferred to hospitalist service. Anticipated date of completion of radiation therapy 12/7   Assessment and Plan: * Intraparenchymal hemorrhage of brain ([place]) Presented with left-sided weakness. CT head shows evidence of progression of metastatic disease with new hemorrhagic lesions along with vasogenic edema and midline shift of 0.8 cm. MRI brain also shows dural spread of the tumor, also shows worsening midline shift of 1.3 cm and small volume subarachnoid and subdural hemorrhage as well. Neurosurgery consulted.  Recommend close monitoring.  Hold anticoagulation.  And Decadron 4 mg every 6 hours.  Patient was started on Keppra for seizure prophylaxis.  Completed 7-day therapy. No intervention recommended by neurosurgery. Radiation oncology was consulted, patient started on whole brain radiation therapy. Completion date 12/7. Patient will complete therapy in the hospital. Steroid management and taper per radiation oncology. Medical oncology also consulted.  Midline shift of brain See intraparenchymal hemorrhage  Vasogenic edema (HCC) See intraparenchymal hemorrhage  Lung cancer metastatic to brain ([place]) Stage IV non-small cell lung cancer. S/P chemoradiation to the lung. Appears to have brain metastasis SP stereotactic radiosurgery in August. Oncology following. Palliative care will be helpful for patient.   Hypertension Blood pressure stable.   Holding  antihypertensive regimen for now.  Acute encephalopathy Secondary to hemorrhage. No evidence of metabolic component. Improved.  Chronic anticoagulation History of DVT in September 22. Eliquis on hold secondary to bleeding. Dopplers negative for DVT. Currently oncology recommended to stay completely off of the anticoagulation.  Hyperlipidemia associated with type 2 diabetes mellitus (HCC) Continue statin.  Diabetes mellitus with no complication (HCC) Hemoglobin A1c 4.9. Diet controlled. Steroid-induced hyperglycemia. Continue sliding scale insulin.  Lantus increased 10 units.  Pressure injury of skin Medial coccyx stage I.  Present on admission. Continue foam dressing.    Body mass index is 27.96 kg/m.  Nutrition Problem: Increased nutrient needs Etiology: acute illness, cancer and cancer related treatments, catabolic illness Pressure Injury 03/06/21 Coccyx Medial Stage 1 -  Intact skin with non-blanchable redness of a localized area usually over a bony prominence. red area with small amount that does not blanch (Active)  03/06/21 2030  Location: Coccyx  Location Orientation: Medial  Staging: Stage 1 -  Intact skin with non-blanchable redness of a localized area usually over a bony prominence.  Wound Description (Comments): red area with small amount that does not blanch  Present on Admission: Yes     Subjective: No nausea no vomiting or no fever no chills.  No chest pain abdominal pain.  No diarrhea constipation.  Oral intake adequate.  Objective: Vital signs were reviewed and unremarkable.  Exam: General: Appear in mild distress, no Rash; Oral Mucosa Clear, moist. no Abnormal Neck Mass Or lumps, Conjunctiva normal  Cardiovascular: S1 and S2 Present, no Murmur, Respiratory: good respiratory effort, Bilateral Air entry present and Occasional Crackles, no wheezes Abdomen: Bowel Sound present, Soft and no tenderness Extremities: no Pedal edema Neurology: alert and  oriented to time, place, and person affect appropriate. no new focal deficit  Data Reviewed: There are  no new results to review at this time.  Disposition:  Status is: Inpatient  Remains inpatient appropriate because: Patient will require completion of emergent radiation therapy in the hospital.  Last day 12/7.  Family Communication: None at bedside.  DVT Prophylaxis: Place and maintain sequential compression device Start: 03/08/21 1727 SCDs Start: 03/05/21 2052   Time spent: 35 minutes.   Author: Berle Mull  03/15/2021 1:50 PM  To reach On-call, see care teams to locate the attending and reach out via [email or web address]. Between 7PM-7AM, please contact night-coverage If you still have difficulty reaching the attending provider, please page the [facility] (Director on Call) for [facility] on amion for assistance.

## 2021-03-16 LAB — GLUCOSE, CAPILLARY
Glucose-Capillary: 191 mg/dL — ABNORMAL HIGH (ref 70–99)
Glucose-Capillary: 401 mg/dL — ABNORMAL HIGH (ref 70–99)
Glucose-Capillary: 61 mg/dL — ABNORMAL LOW (ref 70–99)
Glucose-Capillary: 110 mg/dL — ABNORMAL HIGH (ref 70–99)
Glucose-Capillary: 235 mg/dL — ABNORMAL HIGH (ref 70–99)

## 2021-03-16 NOTE — Progress Notes (Signed)
Patient's CBG 61, MD amion paged, no stat labs per MD, give juice and snack and recheck in an hour.

## 2021-03-16 NOTE — Progress Notes (Signed)
[facility] PROGRESS NOTE  Patient: Katelyn Kennedy SVX:[id number]   PCP: [name], MD DOB: Oct 24, 1945   DOA: 03/05/2021   DOS: 03/16/2021    Subjective: No acute complaint.  No nausea no vomiting.  No dizziness.  Objective:  Vitals:   03/16/21 0609 03/16/21 1346  BP: 120/65 122/61  Pulse: 82 90  Resp: 16 18  Temp: (!) 97.5 F (36.4 C) 98 F (36.7 C)  SpO2: 97% 93%    Ambulating without any complaints.  Assessment and plan: Continue current care Last detailed note 03/15/2021.  Author: Berle Mull, MD [facility] 03/16/2021 4:53 PM   If 7PM-7AM, please contact night-coverage at [email or web address]

## 2021-03-17 ENCOUNTER — Inpatient Hospital Stay: Payer: PPO

## 2021-03-17 ENCOUNTER — Inpatient Hospital Stay: Payer: PPO | Admitting: Internal Medicine

## 2021-03-17 ENCOUNTER — Ambulatory Visit: Payer: Self-pay | Admitting: Radiation Oncology

## 2021-03-17 ENCOUNTER — Ambulatory Visit
Admit: 2021-03-17 | Discharge: 2021-03-17 | Disposition: A | Discharge status: Home / Self Care | Payer: PPO | Attending: Radiation Oncology | Admitting: Radiation Oncology

## 2021-03-17 DIAGNOSIS — Z7901 Long term (current) use of anticoagulants: Secondary | ICD-10-CM | POA: Diagnosis not present

## 2021-03-17 DIAGNOSIS — C7931 Secondary malignant neoplasm of brain: Secondary | ICD-10-CM | POA: Diagnosis not present

## 2021-03-17 DIAGNOSIS — C3490 Malignant neoplasm of unspecified part of unspecified bronchus or lung: Secondary | ICD-10-CM | POA: Diagnosis not present

## 2021-03-17 DIAGNOSIS — I619 Nontraumatic intracerebral hemorrhage, unspecified: Secondary | ICD-10-CM | POA: Diagnosis not present

## 2021-03-17 LAB — GLUCOSE, CAPILLARY
Glucose-Capillary: 173 mg/dL — ABNORMAL HIGH (ref 70–99)
Glucose-Capillary: 238 mg/dL — ABNORMAL HIGH (ref 70–99)
Glucose-Capillary: 164 mg/dL — ABNORMAL HIGH (ref 70–99)
Glucose-Capillary: 216 mg/dL — ABNORMAL HIGH (ref 70–99)

## 2021-03-17 NOTE — TOC Progression Note (Signed)
Transition of Care [facility]) - Progression Note    Patient Details  Name: Katelyn Kennedy MRN: [id number] Date of Birth: 1946/01/14  Transition of Care [facility] CM/SW Manuel Garcia, [place] Phone Number: 03/17/2021, 2:06 PM  Clinical Narrative:   Patient confirms that she has supervision around the clock at d/c.  She is planning on returning home with [facility] PT.  Cindie with [facility] confirms that they will provide [facility] services.  [name] also states she plans on using a rollator at d/c, has already ordered one herself and it should be at her home by Wednesday when she discharges. TOC will continue to follow during the course of hospitalization.     Expected Discharge Plan: [place] Barriers to Discharge: No Barriers Identified  Expected Discharge Plan and Services Expected Discharge Plan: [place]   Discharge Planning Services: CM Consult Post Acute Care Choice: [place] arrangements for the past 2 months: Apartment                                       Social Determinants of Health (SDOH) Interventions    Readmission Risk Interventions Readmission Risk Prevention Plan 11/08/2020  Post Dischage Appt Complete  Medication Screening Complete  Transportation Screening Complete  Some recent data might be hidden

## 2021-03-17 NOTE — Care Management Important Message (Signed)
Important Message  Patient Details IM Letter placed in Patients room. Name: GENEE RANN MRN: [id number] Date of Birth: [birth year]   Medicare Important Message Given:  Yes     [name] 03/17/2021, 11:52 AM

## 2021-03-17 NOTE — Progress Notes (Signed)
Physical Therapy Treatment Patient Details Name: Katelyn Kennedy MRN: [id number] DOB: 10-24-1945 Today's Date: 03/17/2021   History of Present Illness 75 year old female adm through [facility] ED 11/21 with complaints of worsening left sided weakness, new onset delirium and visual hallucinations. CT head demonstrated progression of metastatic lung cancer with multiple hemorrhagic lesions and vasogenic edema. Notably she is on Eliquis for DVT. Neurosurgery consulted and did not recommend surgical intervention at this time.  PMH:HTN, arthritis, DM,. 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    PT Comments    Pt making gradual improvement.  Improved attention to task today.  Progressed walking to supervision with cues.  Added standing exercises for strengthening/endurance with min guard and cues.  Continue plan of care.    Recommendations for follow up therapy are one component of a multi-disciplinary discharge planning process, led by the attending physician.  Recommendations may be updated based on patient status, additional functional criteria and insurance authorization.  Follow Up Recommendations  Home health PT     Assistance Recommended at Discharge Frequent or constant Supervision/Assistance (due to cognition)  Equipment Recommendations  Rollator (4 wheels)    Recommendations for Other Services       Precautions / Restrictions Precautions Precautions: Fall Restrictions Weight Bearing Restrictions: No     Mobility  Bed Mobility               General bed mobility comments: patient was seated in recliner at start of session.    Transfers Overall transfer level: Needs assistance Equipment used: Rolling walker (2 wheels) Transfers: Sit to/from Stand Sit to Stand: Supervision           General transfer comment: Performed x 11 during  session; cues for hand placement    Ambulation/Gait Ambulation/Gait assistance: Supervision Gait Distance (Feet): 300 Feet Assistive device: Rolling walker (2 wheels) Gait Pattern/deviations: Step-through pattern;Decreased stride length Gait velocity: decreased     General Gait Details: Close supervision for safety with frequent cues for RW proximity   Stairs             Wheelchair Mobility    Modified Rankin (Stroke Patients Only)       Balance Overall balance assessment: Needs assistance Sitting-balance support: Feet supported Sitting balance-Leahy Scale: Good     Standing balance support: No upper extremity supported;Bilateral upper extremity supported Standing balance-Leahy Scale: Fair Standing balance comment: Ambulated with AD but could static stand without support                            Cognition Arousal/Alertness: Awake/alert Behavior During Therapy: WFL for tasks assessed/performed Overall Cognitive Status: No family/caregiver present to determine baseline cognitive functioning                     Current Attention Level: Selective Memory: Decreased short-term memory Following Commands: Follows multi-step commands with increased time   Awareness: Emergent Problem Solving: Difficulty sequencing General Comments: Pt with improved attention to task        Exercises General Exercises - Lower Extremity Long Arc Quad: AROM;Both;10 reps;Seated Hip Flexion/Marching: AROM;Both (10 in seated and 10 in standing with RW) Heel Raises: AROM;Both;10 reps;Standing (with RW) Other Exercises Other Exercises: Sit to stands x 10 with min guard and cues; 5 x sit to stand in  16 seconds    General Comments General comments (skin integrity, edema, etc.): VSS      Pertinent Vitals/Pain Pain Assessment: No/denies pain    Home Living                          Prior Function            PT Goals (current goals can now be found  in the care plan section) Progress towards PT goals: Progressing toward goals    Frequency    Min 3X/week      PT Plan Current plan remains appropriate    Co-evaluation              AM-PAC PT "6 Clicks" Mobility   Outcome Measure  Help needed turning from your back to your side while in a flat bed without using bedrails?: A Little Help needed moving from lying on your back to sitting on the side of a flat bed without using bedrails?: A Little Help needed moving to and from a bed to a chair (including a wheelchair)?: A Little Help needed standing up from a chair using your arms (e.g., wheelchair or bedside chair)?: A Little Help needed to walk in hospital room?: A Little Help needed climbing 3-5 steps with a railing? : A Little 6 Click Score: 18    End of Session Equipment Utilized During Treatment: Gait belt Activity Tolerance: Patient tolerated treatment well Patient left: in chair;with call bell/phone within reach;with chair alarm set Nurse Communication: Mobility status PT Visit Diagnosis: Other abnormalities of gait and mobility (R26.89);Other symptoms and signs involving the nervous system (R29.898)     Time: 9012-2241 PT Time Calculation (min) (ACUTE ONLY): 19 min  Charges:  $Gait Training: 8-22 mins                     Abran Richard, PT Acute Rehab Services Pager [phone] [facility] [place] 03/17/2021, 2:52 PM

## 2021-03-17 NOTE — Progress Notes (Signed)
Occupational Therapy Treatment Patient Details Name: Katelyn Kennedy MRN: [id number] DOB: [birth year] Today's Date: 03/17/2021   History of present illness 75 year old female adm through [facility] ED 11/21 with complaints of worsening left sided weakness, new onset delirium and visual hallucinations. CT head demonstrated progression of metastatic lung cancer with multiple hemorrhagic lesions and vasogenic edema. Notably she is on Eliquis for DVT. Neurosurgery consulted and did not recommend surgical intervention at this time.  PMH:HTN, arthritis, DM,. 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   OT comments  Patient was noted to be quick to fatigue during session. Patient was unable to sequence through task with 3 steps without continued cues. Patient was able to complete toilet transfer with min guard with min education on Kennedy to sequence task. Patient was noted to bump walker into doorway on L side with increased cues to attend to this error. Patient will need 24/7 supervision in next level of care for safety. Patient would continue to benefit from skilled OT services at this time while admitted and after d/c to address noted deficits in order to improve overall safety and independence in ADLs.     Recommendations for follow up therapy are one component of a multi-disciplinary discharge planning process, led by the attending physician.  Recommendations may be updated based on patient status, additional functional criteria and insurance authorization.    Follow Up Recommendations  Home health OT    Assistance Recommended at Discharge Frequent or constant Supervision/Assistance  Equipment Recommendations  Tub/shower seat    Recommendations for Other Services      Precautions / Restrictions Precautions Precautions: Fall Restrictions Weight Bearing Restrictions:  No       Mobility Bed Mobility               General bed mobility comments: patient was seated in recliner at start of session.    Transfers Overall transfer level: Needs assistance Equipment used: Rolling walker (2 wheels) Transfers: Sit to/from Stand Sit to Stand: Supervision                 Balance                                           ADL either performed or assessed with clinical judgement   ADL Overall ADL's : Needs assistance/impaired                       Lower Body Dressing Details (indicate cue type and reason): patient was able to don/doff socks sitting in recliner with increased time and min guard. patient was unable to correct gripper sock with patietn noting it was crooked multiple times. therapist corrected with min A Toilet Transfer: Nature conservation officer;Ambulation;Rolling walker (2 wheels) Toilet Transfer Details (indicate cue type and reason): with increased time. patient was noted to not attent to L side with RW getting stuck in doorway on way out of bathroom with increased education to scan and pull walker closer to her.                Extremity/Trunk Assessment              Vision       Perception  Praxis      Cognition Arousal/Alertness: Awake/alert Behavior During Therapy: WFL for tasks assessed/performed Overall Cognitive Status: No family/caregiver present to determine baseline cognitive functioning                                 General Comments: patient unable to sequence through more than one task at a time.          Exercises     Shoulder Instructions       General Comments      Pertinent Vitals/ Pain       Pain Assessment: No/denies pain  Home Living                                          Prior Functioning/Environment              Frequency  Min 2X/week        Progress Toward Goals  OT Goals(current goals can now  be found in the care plan section)  Progress towards OT goals: Progressing toward goals     Plan Discharge plan remains appropriate    Co-evaluation                 AM-PAC OT "6 Clicks" Daily Activity     Outcome Measure   Help from another person eating meals?: None Help from another person taking care of personal grooming?: A Little Help from another person toileting, which includes using toliet, bedpan, or urinal?: A Little Help from another person bathing (including washing, rinsing, drying)?: A Little Help from another person to put on and taking off regular upper body clothing?: A Little Help from another person to put on and taking off regular lower body clothing?: A Little 6 Click Score: 19    End of Session Equipment Utilized During Treatment: Rolling walker (2 wheels)  OT Visit Diagnosis: Unsteadiness on feet (R26.81);Other symptoms and signs involving cognitive function;Other symptoms and signs involving the nervous system (R29.898);Muscle weakness (generalized) (M62.81);History of falling (Z91.81)   Activity Tolerance Patient tolerated treatment well   Patient Left in chair;with chair alarm set;with call bell/phone within reach   Nurse Communication Mobility status        Time: 8446-5207 OT Time Calculation (min): 20 min  Charges: OT General Charges $OT Visit: 1 Visit OT Treatments $Self Care/Home Management : 8-22 mins  Jackelyn Poling OTR/L, MS Acute Rehabilitation Department Office# [phone] Pager# [phone]   Marcellina Millin 03/17/2021, 12:08 PM

## 2021-03-17 NOTE — Progress Notes (Signed)
[facility] PROGRESS NOTE  Patient: Katelyn Kennedy KAJ:[id number]   PCP: [name], MD DOB: 1946-03-06   DOA: 03/05/2021   DOS: 03/17/2021    Subjective: No acute complaint.  No nausea no vomiting.  No fever no chills.  No chest pain.  Objective:  Vitals:   03/17/21 1221 03/17/21 1349  BP: 118/62 128/66  Pulse: 93 98  Resp: 18 18  Temp: 97.6 F (36.4 C) 98.1 F (36.7 C)  SpO2: 95% 96%    Clear to auscultation.  S1-S2 present.  Bowel sound present.  Assessment and plan: Medically remained stable.  Completing radiation therapy on 12/7.  Discharge home with home health after that. Please see last full note on 12/3.  Author: Berle Mull, MD [facility] 03/17/2021 7:18 PM   If 7PM-7AM, please contact night-coverage at [email or web address]

## 2021-03-18 ENCOUNTER — Ambulatory Visit
Admit: 2021-03-18 | Discharge: 2021-03-18 | Disposition: A | Discharge status: Home / Self Care | Payer: PPO | Attending: Radiation Oncology | Admitting: Radiation Oncology

## 2021-03-18 DIAGNOSIS — I619 Nontraumatic intracerebral hemorrhage, unspecified: Secondary | ICD-10-CM | POA: Diagnosis not present

## 2021-03-18 LAB — BASIC METABOLIC PANEL
Anion gap: 6 (ref 5–15)
BUN: 38 mg/dL — ABNORMAL HIGH (ref 8–23)
CO2: 24 mmol/L (ref 22–32)
Calcium: 8.6 mg/dL — ABNORMAL LOW (ref 8.9–10.3)
Chloride: 106 mmol/L (ref 98–111)
Creatinine, Ser: 0.49 mg/dL (ref 0.44–1.00)
GFR, Estimated: >60 mL/min (ref >60)
Glucose, Bld: 158 mg/dL — ABNORMAL HIGH (ref 70–99)
Potassium: 4.3 mmol/L (ref 3.5–5.1)
Sodium: 136 mmol/L (ref 135–145)

## 2021-03-18 LAB — CBC
HCT: 38 % (ref 36.0–46.0)
Hemoglobin: 12.9 g/dL (ref 12.0–15.0)
MCH: 31.2 pg (ref 26.0–34.0)
MCHC: 33.9 g/dL (ref 30.0–36.0)
MCV: 91.8 fL (ref 80.0–100.0)
Platelets: 160 10*3/uL (ref 150–400)
RBC: 4.14 MIL/uL (ref 3.87–5.11)
RDW: 12.9 % (ref 11.5–15.5)
WBC: 12.6 10*3/uL — ABNORMAL HIGH (ref 4.0–10.5)
nRBC: 0 % (ref 0.0–0.2)

## 2021-03-18 LAB — GLUCOSE, CAPILLARY
Glucose-Capillary: 149 mg/dL — ABNORMAL HIGH (ref 70–99)
Glucose-Capillary: 227 mg/dL — ABNORMAL HIGH (ref 70–99)
Glucose-Capillary: 154 mg/dL — ABNORMAL HIGH (ref 70–99)
Glucose-Capillary: 260 mg/dL — ABNORMAL HIGH (ref 70–99)

## 2021-03-18 MED ORDER — DEXAMETHASONE 2 MG PO TABS: ORAL_TABLET | ORAL | 0 refills | Status: DC

## 2021-03-18 MED ORDER — FAMOTIDINE 20 MG PO TABS
20.0000 mg | ORAL_TABLET | Freq: Every day | ORAL | 0 refills | Status: DC
Start: 2021-03-19 — End: 2021-05-15

## 2021-03-18 MED ORDER — INSULIN ASPART 100 UNIT/ML IJ SOLN
5.0000 [IU] | Freq: Once | INTRAMUSCULAR | Status: AC
  Administered 2021-03-18: 5 [IU] via SUBCUTANEOUS

## 2021-03-18 MED ORDER — POLYETHYLENE GLYCOL 3350 17 G PO PACK: 17.0000 g | PACK | Freq: Every day | ORAL | 0 refills | Status: AC | PRN

## 2021-03-18 NOTE — Discharge Summary (Signed)
Physician Discharge Summary   Patient name: Katelyn Kennedy  Admit date:     03/05/2021  Discharge date: 03/19/2021  Discharge Physician: Berle Mull   PCP: [name], MD   Recommendations at discharge:  Follow-up with medical oncology as recommended. Follow-up with radiation oncology as recommended. Follow-up with PCP as recommended. Follow-up with neurosurgery as needed. Outpatient referral for palliative care placed.  Discharge Diagnoses Principal Problem:   Intraparenchymal hemorrhage of brain (HCC) Active Problems:   Vasogenic edema (HCC)   Midline shift of brain   Lung cancer metastatic to brain [facility])   Hypertensive urgency   Chronic anticoagulation   Acute encephalopathy   Diet-controlled type 2 diabetes mellitus without any complication   Hyperlipidemia associated with type 2 diabetes mellitus ([place])   Pressure injury of skin   Hospital Course   Past medical history of NSCLC with mets to brain, residual left-sided weakness 7/22 resection of the right posterior frontal dural based mass. 9/22 acute DVT. 03/03/2021 admitted to the hospital under ICU.  Neurosurgery consulted radiation oncology consulted. 11/25 transferred to [facility] for ongoing radiation therapy. 11/27 transferred to hospitalist service. Date of completion of radiation therapy 12/7  * Intraparenchymal hemorrhage of brain ([place]) Presented with left-sided weakness. CT head shows evidence of progression of metastatic disease with new hemorrhagic lesions along with vasogenic edema and midline shift of 0.8 cm. MRI brain also shows dural spread of the tumor, also shows worsening midline shift of 1.3 cm and small volume subarachnoid and subdural hemorrhage as well. Neurosurgery consulted.  Recommend close monitoring.  Hold anticoagulation.  And Decadron 4 mg every 6 hours.  Patient was started on Keppra for seizure prophylaxis.  Completed 7-day therapy. No intervention recommended by  neurosurgery. Radiation oncology was consulted, patient started on whole brain radiation therapy. Completion date 12/7. Patient will complete therapy in the hospital. Steroid management and taper per radiation oncology. Medical oncology also consulted.  Midline shift of brain See intraparenchymal hemorrhage  Vasogenic edema (HCC) See intraparenchymal hemorrhage. Decadron taper 4 mg TID the day after completes brain XRT for 5 days, then 4mg  BID for 5 days, 4mg  daily for 5 days, 2mg  daily for 5 days, then 2mg  every other day for 6 days  Lung cancer metastatic to brain ([place]) Stage IV non-small cell lung cancer. S/P chemoradiation to the lung. Appears to have brain metastasis SP stereotactic radiosurgery in August. Oncology following. Palliative care will be helpful for patient.   Hypertensive urgency Blood pressure stable.   Holding antihypertensive regimen for now.  Acute encephalopathy Secondary to hemorrhage. No evidence of metabolic component. Improved.  Chronic anticoagulation History of DVT in September 22. Eliquis on hold secondary to bleeding. Dopplers negative for DVT. Currently oncology recommended to stay completely off of the anticoagulation.  Hyperlipidemia associated with type 2 diabetes mellitus (HCC) Continue statin.  Diet-controlled type 2 diabetes mellitus without any complication Hemoglobin N0U 4.9. Diet controlled. Steroid-induced hyperglycemia. In the hospital treated with sliding scale insulin. Expectation is that sugar control will improve with tapering of Decadron.  Pressure injury of skin Medial coccyx stage I.  Present on admission. Continue foam dressing.    Body mass index is 26.9 kg/m.  Nutrition Problem: Increased nutrient needs Etiology: acute illness, cancer and cancer related treatments, catabolic illness Nutrition Interventions: Interventions: Ensure Enlive (each supplement provides 350kcal and 20 grams of protein), Liberalize  Diet, Glucerna shake, MVI, Carnation Instant Breakfast  Pressure Injury 03/06/21 Coccyx Medial Stage 1 -  Intact skin  with non-blanchable redness of a localized area usually over a bony prominence. red area with small amount that does not blanch (Active)  03/06/21 2030  Location: Coccyx  Location Orientation: Medial  Staging: Stage 1 -  Intact skin with non-blanchable redness of a localized area usually over a bony prominence.  Wound Description (Comments): red area with small amount that does not blanch  Present on Admission: Yes    Procedures performed: none   Condition at discharge: good  Exam General: Appear in mild distress; no visible Abnormal Neck Mass Or lumps, Conjunctiva normal Cardiovascular: S1 and S2 Present, no Murmur, Respiratory: good respiratory effort, Bilateral Air entry present and  right sided Crackles and wheezes Abdomen: Bowel Sound present Extremities: no Pedal edema Neurology: alert and oriented to time, place, and person  Disposition: Home  Discharge time: greater than 30 minutes.  Follow-up Information     [facility] Follow up.   Specialty: Home Health Services Why: They will contact you within 48 hours of d/c to set up a time to come out Contact information: [place] [address] [phone]         [name], MD. Schedule an appointment as soon as possible for a visit in 1 week(s).   Specialty: Family Medicine Contact information: [place] [address] [facility] Follow up.   Specialty: Radiation Oncology Contact information: [place] [id number] [place] [address] [phone]                Allergies as of 03/18/2021       Reactions   Codeine Nausea Only        Medication List     STOP taking these medications    apixaban 5 MG Tabs tablet Commonly known as:  ELIQUIS   levETIRAcetam 500 MG tablet Commonly known as: Keppra   lisinopril 2.5 MG tablet Commonly known as: ZESTRIL       TAKE these medications    acetaminophen 500 MG tablet Commonly known as: TYLENOL Take 500 mg by mouth daily as needed for moderate pain or headache.   amitriptyline 50 MG tablet Commonly known as: ELAVIL Take 1 tablet (50 mg total) by mouth at bedtime.   atorvastatin 40 MG tablet Commonly known as: LIPITOR 1 tablet  po daily What changed:  how much to take how to take this when to take this additional instructions   cholecalciferol 25 MCG (1000 UNIT) tablet Commonly known as: VITAMIN D Take 1,000 Units by mouth daily.   CITRACAL + D PO Take 1 tablet by mouth daily.   dexamethasone 2 MG tablet Commonly known as: DECADRON 4 mg 3 times a day for 5 days, then 4 mg twice a day for 5 days, then 4 mg daily for 5 days, then 2 mg daily for 5 days, then 2 mg every other day for 6 days then stop Start taking on: March 19, 2021 What changed:  how much to take how to take this when to take this additional instructions   famotidine 20 MG tablet Commonly known as: PEPCID Take 1 tablet (20 mg total) by mouth daily. Start taking on: March 19, 2021   HYDROcodone-acetaminophen 5-325 MG tablet Commonly known as: NORCO/VICODIN Take 1 tablet by mouth every 4 (four) hours as needed for moderate pain.   LORazepam 0.5 MG tablet Commonly known as:  Ativan 1 tab po 30 minutes prior to radiation or MRI scans   multivitamin capsule Take 1 capsule by mouth daily. Centrum Silver   polyethylene glycol 17 g packet Commonly known as: MIRALAX / GLYCOLAX Take 17 g by mouth daily as needed for moderate constipation.   vitamin B-12 1000 MCG tablet Commonly known as: CYANOCOBALAMIN Take 1,000 mcg by mouth daily.               Durable Medical Equipment  (From admission, onward)           Start     Ordered   03/18/21 1613  For home use only DME  Shower stool  Once        03/18/21 1612   03/18/21 1612  For home use only DME 4 wheeled rolling walker with seat  Once       Question:  Patient needs a walker to treat with the following condition  Answer:  Physical deconditioning   03/18/21 1611            Filed Weights   03/16/21 0500 03/17/21 0500 03/18/21 0456  Weight: 63.5 kg 64.4 kg 60.4 kg    CT HEAD WO CONTRAST (5MM)  Result Date: 03/05/2021 CLINICAL DATA:  Metastatic right lung cancer, brain metastases EXAM: CT HEAD WITHOUT CONTRAST TECHNIQUE: Contiguous axial images were obtained from the base of the skull through the vertex without intravenous contrast. COMPARISON:  12/24/2020, 11/27/2020 FINDINGS: Brain: Postsurgical changes are again noted from right frontoparietal craniotomy. Suspected recurrent metastatic disease along the right frontal convexity at the gray-white interface measuring up to 1.1 x 0.8 cm reference image 25/3. Mild surrounding vasogenic edema. Multiple new hemorrhagic foci are seen throughout the left frontal, right frontal, right temporal, right parietal regions consistent with progressive metastatic lung cancer. Hemorrhagic metastatic deposits in the right frontal lobe measure up to 1.8 x 1.0 cm reference image 15/3. Left frontal hemorrhagic focus measures up to 1.3 x 0.8 cm. Significant surrounding vasogenic edema. There is leftward midline shift, measuring up to 8 mm at the level of the septum pellucidum. Smaller hemorrhagic foci within the right frontotemporal region image 20/3 measures 8 mm, and in the right temporoparietal region image 16 measures 0.9 cm. On the reconstructed images, multiple areas of high attenuation are seen within the dura, deep to the craniotomy site as well as along the right frontal convexity, concerning for dural spread of disease. Vascular: No hyperdense vessel or unexpected calcification. Skull: Postsurgical changes from right frontal parietal craniotomy. No acute fractures.  Sinuses/Orbits: No acute finding. Other: None. IMPRESSION: 1. Marked progression of metastatic lung cancer, with multiple new hemorrhagic metastatic lesions as above. Significant right frontal vasogenic edema results in leftward midline shift measuring approximately 0.8 cm. Critical Value/emergent results were called by telephone at the time of interpretation on 03/05/2021 at 5:37 pm to provider Regan Lemming , who verbally acknowledged these results. Electronically Signed   By: Randa Ngo M.D.   On: 03/05/2021 17:37   MR BRAIN W WO CONTRAST  Result Date: 03/06/2021 CLINICAL DATA:  Mental status change. New onset delirium and visual hallucinations. Worsening left-sided weakness. History of non-small cell lung cancer with brain metastases. EXAM: MRI HEAD WITHOUT AND WITH CONTRAST TECHNIQUE: Multiplanar, multiecho pulse sequences of the brain and surrounding structures were obtained without and with intravenous contrast. CONTRAST:  71mL GADAVIST GADOBUTROL 1 MMOL/ML IV SOLN COMPARISON:  Head CT 03/05/2021 and MRI 11/27/2020 FINDINGS: Brain: Sequelae of right frontal craniotomy are again identified. A  resection cavity containing blood products in the high posterior right frontal lobe has decreased in size from the prior MRI. Enhancement along the margins of the resection cavity is greatest laterally and inferiorly, measuring up to 5 mm in thickness with direct comparison to the prior MRI limited due to diffuse T1 hyperintense blood products in the cavity on the prior study as well as interval cavity contraction. There is new diffuse dural enhancement over the right cerebral convexity with numerous areas of nodularity including thick, nodular enhancement over the anterior aspect of the right frontal lobe including along the floor of the anterior cranial fossa. Nodular dural enhancement also extends along the falx, greatest anteriorly. Heterogeneously enhancing intra-axial tumor anteriorly in the right frontal  lobe measures 2.1 x 1.9 cm and extends medially to abut the falx. Similar tumor anteriorly in the left frontal lobe measures 1.8 x 1.1 cm. Both the intra-axial and dural-based tumor components are hemorrhagic. There is new extensive vasogenic edema anteriorly in the right frontal lobe which extends into the corpus callosum and results in 1.3 cm of leftward midline shift. There is milder vasogenic edema in the anterior left frontal lobe, and there are also small areas of vasogenic edema scattered elsewhere in the right frontal, right parietal, and right occipital lobes. FLAIR hyperintensity involving multiple right cerebral sulci, predominantly towards the vertex as well as medially along the interhemispheric fissure could reflect a small amount of subarachnoid hemorrhage, proteinaceous material, or tumor although there is not a significant amount of leptomeningeal enhancement in these regions to clearly indicate the presence of tumor. There could be trace subdural hemorrhage over the right cerebral convexity as well. No acute large territory infarct is present. Vascular: Major intracranial vascular flow voids are preserved. Skull and upper cervical spine: Right frontal craniotomy. No suspicious marrow lesion. Sinuses/Orbits: Bilateral cataract extraction. Paranasal sinuses and mastoid air cells are clear. Other: None. IMPRESSION: 1. Marked progression of intracranial metastatic disease since 11/27/2020. This is largely dural spread of tumor over the right cerebral hemisphere with some intraparenchymal components, most notably in the anterior right frontal lobe where there is extensive vasogenic edema and 1.3 cm of leftward midline shift. 2. Possible small volume subarachnoid and subdural hemorrhage. Electronically Signed   By: Logan Bores M.D.   On: 03/06/2021 12:48   VAS [name] LOWER EXTREMITY VENOUS (DVT)  Result Date: 03/08/2021  Lower Venous DVT Study Patient Name:  Katelyn Kennedy  Date of Exam:   03/07/2021  Medical Rec #: [id number]        Accession #:    [id number] Date of Birth: Feb 14, 1946       Patient Gender: F Patient Age:   1 years Exam Location:  [facility] Procedure:      VAS [name] LOWER EXTREMITY VENOUS (DVT) Referring Phys: RAKESH ALVA --------------------------------------------------------------------------------  Indications: Previous DVT - follow up exam.  Risk Factors: Cancer - metastatic lung. Anticoagulation: Eliquis prior to admission. Comparison Study: Previous exam on 12/13/2020 positive for RLE (FV, PopV, & PTV) &                   LLE (gastroc) DVTs Performing Technologist: Jody Hill RVT, RDMS  Examination Guidelines: A complete evaluation includes B-mode imaging, spectral Doppler, color Doppler, and power Doppler as needed of all accessible portions of each vessel. Bilateral testing is considered an integral part of a complete examination. Limited examinations for reoccurring indications may be performed as noted. The reflux portion of the exam is performed with  the patient in reverse Trendelenburg.  +---------+---------------+---------+-----------+----------+--------------+ RIGHT    CompressibilityPhasicitySpontaneityPropertiesThrombus Aging +---------+---------------+---------+-----------+----------+--------------+ CFV      Full           Yes      Yes                                 +---------+---------------+---------+-----------+----------+--------------+ SFJ      Full                                                        +---------+---------------+---------+-----------+----------+--------------+ FV Prox  Full           Yes      Yes                                 +---------+---------------+---------+-----------+----------+--------------+ FV Mid   Full           Yes      Yes                                 +---------+---------------+---------+-----------+----------+--------------+ FV DistalFull           Yes      Yes                                  +---------+---------------+---------+-----------+----------+--------------+ PFV      Full                                                        +---------+---------------+---------+-----------+----------+--------------+ POP      Full           Yes      Yes                                 +---------+---------------+---------+-----------+----------+--------------+ PTV      Full                                                        +---------+---------------+---------+-----------+----------+--------------+ PERO     Full                                                        +---------+---------------+---------+-----------+----------+--------------+   +---------+---------------+---------+-----------+----------+--------------+ LEFT     CompressibilityPhasicitySpontaneityPropertiesThrombus Aging +---------+---------------+---------+-----------+----------+--------------+ CFV      Full           Yes      Yes                                 +---------+---------------+---------+-----------+----------+--------------+  SFJ      Full                                                        +---------+---------------+---------+-----------+----------+--------------+ FV Prox  Full           Yes      Yes                                 +---------+---------------+---------+-----------+----------+--------------+ FV Mid   Full           Yes      Yes                                 +---------+---------------+---------+-----------+----------+--------------+ FV DistalFull           Yes      Yes                                 +---------+---------------+---------+-----------+----------+--------------+ PFV      Full                                                        +---------+---------------+---------+-----------+----------+--------------+ POP      Full           Yes      Yes                                  +---------+---------------+---------+-----------+----------+--------------+ PTV      Full                                                        +---------+---------------+---------+-----------+----------+--------------+ PERO     Full                                                        +---------+---------------+---------+-----------+----------+--------------+     Summary: BILATERAL: - No evidence of deep vein thrombosis seen in the lower extremities, bilaterally. - No evidence of superficial venous thrombosis in the lower extremities, bilaterally. -No evidence of popliteal cyst, bilaterally.   *See table(s) above for measurements and observations. Electronically signed by Orlie Pollen on 03/08/2021 at 10:43:53 AM.    Final    Results for orders placed or performed during the hospital encounter of 03/05/21  Resp Panel by RT-PCR (Flu A&B, Covid) Nasopharyngeal Swab     Status: None   Collection Time: 03/05/21  6:03 PM   Specimen: Nasopharyngeal Swab; Nasopharyngeal(NP) swabs in vial transport medium  Result Value Ref Range Status   SARS Coronavirus 2 by RT PCR NEGATIVE NEGATIVE Final  Comment: (NOTE) SARS-CoV-2 target nucleic acids are NOT DETECTED.  The SARS-CoV-2 RNA is generally detectable in upper respiratory specimens during the acute phase of infection. The lowest concentration of SARS-CoV-2 viral copies this assay can detect is 138 copies/mL. A negative result does not preclude SARS-Cov-2 infection and should not be used as the sole basis for treatment or other patient management decisions. A negative result may occur with  improper specimen collection/handling, submission of specimen other than nasopharyngeal swab, presence of viral mutation(s) within the areas targeted by this assay, and inadequate number of viral copies(<138 copies/mL). A negative result must be combined with clinical observations, patient history, and epidemiological information. The expected result  is Negative.  Fact Sheet for Patients:  EntrepreneurPulse.com.au  Fact Sheet for Healthcare Providers:  IncredibleEmployment.be  This test is no t yet approved or cleared by the [place] FDA and  has been authorized for detection and/or diagnosis of SARS-CoV-2 by FDA under an Emergency Use Authorization (EUA). This EUA will remain  in effect (meaning this test can be used) for the duration of the COVID-19 declaration under Section 564(b)(1) of the Act, 21 U.S.C.section 360bbb-3(b)(1), unless the authorization is terminated  or revoked sooner.       Influenza A by PCR NEGATIVE NEGATIVE Final   Influenza B by PCR NEGATIVE NEGATIVE Final    Comment: (NOTE) The Xpert Xpress SARS-CoV-2/FLU/RSV plus assay is intended as an aid in the diagnosis of influenza from Nasopharyngeal swab specimens and should not be used as a sole basis for treatment. Nasal washings and aspirates are unacceptable for Xpert Xpress SARS-CoV-2/FLU/RSV testing.  Fact Sheet for Patients: EntrepreneurPulse.com.au  Fact Sheet for Healthcare Providers: IncredibleEmployment.be  This test is not yet approved or cleared by the [place] FDA and has been authorized for detection and/or diagnosis of SARS-CoV-2 by FDA under an Emergency Use Authorization (EUA). This EUA will remain in effect (meaning this test can be used) for the duration of the COVID-19 declaration under Section 564(b)(1) of the Act, 21 U.S.C. section 360bbb-3(b)(1), unless the authorization is terminated or revoked.  Performed at [facility], [place] [address], [place], [place] [address]   MRSA Next Gen by PCR, Nasal     Status: None   Collection Time: 03/05/21 10:25 PM   Specimen: Nasal Mucosa; Nasal Swab  Result Value Ref Range Status   MRSA by PCR Next Gen NOT DETECTED NOT DETECTED Final    Comment: (NOTE) The GeneXpert MRSA Assay (FDA approved for NASAL  specimens only), is one component of a comprehensive MRSA colonization surveillance program. It is not intended to diagnose MRSA infection nor to guide or monitor treatment for MRSA infections. Test performance is not FDA approved in patients less than 69 years old. Performed at [facility], [place] [address], [place], [place] [address]    Recent Labs  Lab 03/15/21 [phone] 03/18/21 0506  WBC 14.1* 12.6*  HGB 12.8 12.9  HCT 38.5 38.0  MCV 93.2 91.8  PLT 190 160   Recent Labs  Lab 03/12/21 0459 03/15/21 0623 03/18/21 0506  NA 138 136 136  K 4.2 4.4 4.3  CL 106 103 106  CO2 26 24 24   GLUCOSE 191* 183* 158*  BUN 35* 36* 38*  CREATININE 0.71 0.71 0.49  CALCIUM 8.9 9.0 8.6*   No results for input(s): AST, ALT, ALKPHOS, BILITOT, PROT, ALBUMIN in the last 168 hours. Recent Labs  Lab 03/17/21 1642 03/17/21 1914 03/18/21 0747 03/18/21 1142 03/18/21 1706  GLUCAP Burns    Author: Berle Mull [facility] 03/18/2021, 8:05 PM

## 2021-03-18 NOTE — Progress Notes (Signed)
   03/18/21 1200  Mobility  Activity Ambulated in hall  Level of Assistance Standby assist, set-up cues, supervision of patient - no hands on  Assistive Device Front wheel walker  Distance Ambulated (ft) 200 ft  Mobility Ambulated with assistance in hallway  Mobility Response Tolerated well  Mobility performed by Mobility specialist  $Mobility charge 1 Mobility   Pt agreeable to mobilize this afternoon. Ambulated about 264ft in hall with RW, tolerated well. No complaints. Left pt in chair with call bell at side. RN notified of session.   [place] Specialist Acute Rehab Services Office: [phone]

## 2021-03-19 ENCOUNTER — Ambulatory Visit
Admit: 2021-03-19 | Discharge: 2021-03-19 | Disposition: A | Discharge status: Home / Self Care | Payer: PPO | Attending: Radiation Oncology | Admitting: Radiation Oncology

## 2021-03-19 ENCOUNTER — Encounter: Payer: Self-pay | Admitting: Radiation Oncology

## 2021-03-19 DIAGNOSIS — R9089 Other abnormal findings on diagnostic imaging of central nervous system: Secondary | ICD-10-CM | POA: Diagnosis not present

## 2021-03-19 DIAGNOSIS — I619 Nontraumatic intracerebral hemorrhage, unspecified: Secondary | ICD-10-CM | POA: Diagnosis not present

## 2021-03-19 DIAGNOSIS — C3431 Malignant neoplasm of lower lobe, right bronchus or lung: Secondary | ICD-10-CM | POA: Diagnosis not present

## 2021-03-19 DIAGNOSIS — C7931 Secondary malignant neoplasm of brain: Secondary | ICD-10-CM | POA: Diagnosis not present

## 2021-03-19 DIAGNOSIS — R531 Weakness: Secondary | ICD-10-CM | POA: Diagnosis not present

## 2021-03-19 LAB — GLUCOSE, CAPILLARY
Glucose-Capillary: 148 mg/dL — ABNORMAL HIGH (ref 70–99)
Glucose-Capillary: 220 mg/dL — ABNORMAL HIGH (ref 70–99)

## 2021-03-19 NOTE — Progress Notes (Signed)
Occupational Therapy Treatment Patient Details Name: Katelyn Kennedy MRN: [id number] DOB: Mar 20, 1946 Today's Date: 03/19/2021   History of present illness 75 year old female adm through [facility] ED 11/21 with complaints of worsening left sided weakness, new onset delirium and visual hallucinations. CT head demonstrated progression of metastatic lung cancer with multiple hemorrhagic lesions and vasogenic edema. Notably she is on Eliquis for DVT. Neurosurgery consulted and did not recommend surgical intervention at this time.  PMH:HTN, arthritis, DM,. 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   OT comments  Patient was able to participate in UB bathing and dressing tasks with set up and occasional verbal cues to sequence through task. Patient was min guard to transfer from edge of bed to recliner in room after set up. Patient's discharge plan remains appropriate at this time. OT will continue to follow acutely.     Recommendations for follow up therapy are one component of a multi-disciplinary discharge planning process, led by the attending physician.  Recommendations may be updated based on patient status, additional functional criteria and insurance authorization.    Follow Up Recommendations  Home health OT    Assistance Recommended at Discharge Frequent or constant Supervision/Assistance  Equipment Recommendations  Tub/shower seat    Recommendations for Other Services      Precautions / Restrictions Precautions Precautions: Fall Restrictions Weight Bearing Restrictions: No       Mobility Bed Mobility Overal bed mobility: Needs Assistance Bed Mobility: Supine to Sit     Supine to sit: Min assist          Transfers Overall transfer level: Needs assistance Equipment used: Rolling walker (2 wheels) Transfers: Sit to/from Stand Sit to  Stand: Supervision                 Balance                                           ADL either performed or assessed with clinical judgement   ADL Overall ADL's : Needs assistance/impaired         Upper Body Bathing: Sitting;Cueing for sequencing;Set up     Lower Body Bathing Details (indicate cue type and reason): patient declined to complete LB bathing at this time stating she wanted to do it later prior to radiation. Upper Body Dressing : Set up;Sitting                     General ADL Comments: patient was min guard and set up to transfer from edge of bed to recliner in room with consitent cues to sequence task.    Extremity/Trunk Assessment              Vision       Perception     Praxis      Cognition Arousal/Alertness: Awake/alert Behavior During Therapy: WFL for tasks assessed/performed Overall Cognitive Status: No family/caregiver present to determine baseline cognitive functioning                                 General Comments: patient continued to need cues to sequence through bathing tasks  Exercises     Shoulder Instructions       General Comments      Pertinent Vitals/ Pain       Pain Assessment: No/denies pain  Home Living                                          Prior Functioning/Environment              Frequency           Progress Toward Goals  OT Goals(current goals can now be found in the care plan section)  Progress towards OT goals: Progressing toward goals     Plan Discharge plan remains appropriate    Co-evaluation                 AM-PAC OT "6 Clicks" Daily Activity     Outcome Measure   Help from another person eating meals?: None Help from another person taking care of personal grooming?: A Little Help from another person toileting, which includes using toliet, bedpan, or urinal?: A Little Help from another person  bathing (including washing, rinsing, drying)?: A Little Help from another person to put on and taking off regular upper body clothing?: A Little Help from another person to put on and taking off regular lower body clothing?: A Little 6 Click Score: 19    End of Session Equipment Utilized During Treatment: Rolling walker (2 wheels)  OT Visit Diagnosis: Unsteadiness on feet (R26.81);Other symptoms and signs involving cognitive function;Other symptoms and signs involving the nervous system (R29.898);Muscle weakness (generalized) (M62.81);History of falling (Z91.81)   Activity Tolerance Patient tolerated treatment well   Patient Left in chair;with chair alarm set;with call bell/phone within reach   Nurse Communication Mobility status        Time: 1155-2080 OT Time Calculation (min): 19 min  Charges: OT General Charges $OT Visit: 1 Visit OT Treatments $Self Care/Home Management : 8-22 mins  Jackelyn Poling OTR/L, MS Acute Rehabilitation Department Office# [phone] Pager# [phone]   Marcellina Millin 03/19/2021, 11:59 AM

## 2021-03-19 NOTE — Plan of Care (Signed)

## 2021-03-19 NOTE — TOC Transition Note (Signed)
Transition of Care [facility]) - CM/SW Discharge Note   Patient Details  Name: Katelyn Kennedy MRN: [id number] Date of Birth: [birth year]  Transition of Care [facility]) CM/SW Contact:  Trish Mage, LCSW Phone Number: 03/19/2021, 10:43 AM   Clinical Narrative:   Patient who is stable for d/c today will return home with the support of [facility] for [facility] needs.  Ordered a rollator from [employer] as patient is in need of one, and clarified that she had not ordered one outside of her.  Delivered to patients room to take home with her.  TOC sign off.    Final next level of care: Home w Home Health Services Barriers to Discharge: No Barriers Identified   Patient Goals and CMS Choice     Choice offered to / list presented to : Patient  Discharge Placement                       Discharge Plan and Services   Discharge Planning Services: CM Consult Post Acute Care Choice: Home Health                               Social Determinants of Health (SDOH) Interventions     Readmission Risk Interventions Readmission Risk Prevention Plan 11/08/2020  Post Dischage Appt Complete  Medication Screening Complete  Transportation Screening Complete  Some recent data might be hidden

## 2021-03-19 NOTE — Plan of Care (Signed)

## 2021-03-19 NOTE — Progress Notes (Signed)
Progress Note  Chart briefly reviewed.  Dr. Posey Pronto has prepped patient for discharge today after completion of XRT.  Saw patient this morning prior to her going for XRT.  Denied complaints.  Anxious to get home.  Pleasant elderly female, awake small built and thinly nourished, sitting up comfortably in chair without distress. Stable vital signs RS: Clear to auscultation.  No increased work of breathing.  Not hypoxic on room air. CVS: S1 and S2 heard, RRR.  No JVD, murmurs or pedal edema.  Not on telemetry. Abdomen: Nondistended, soft and nontender.  No organomegaly or masses appreciated.  Normal bowel sounds heard. CNS: Alert and oriented x3.  No focal neurological deficits.   Labs  CBGs mildly uncontrolled and fluctuating between 148-260 over the last 24 hours.  No other new blood work noted.  Assessment and plan: Refer to DC summary for details done by Dr. Posey Pronto on 03/18/2021.  1. 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  Anticoagulation held.  On gradually tapering dose of Decadron.  Completed 7 days course of Keppra for seizure prophylaxis.  Radiation oncology was consulted and completing whole brain radiation treatment course today 03/19/2021.  Outpatient follow-up with medical and radiation oncology. 2.  Hypertensive urgency: Resolved.  Blood pressures controlled.  Not on antihypertensives. 3.  Acute encephalopathy: Secondary to intracranial hemorrhage with mass-effect.  Improved and may have even resolved. 4.  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  Doppler negative for DVT.  As per oncology recommendations, to stay completely off of anticoagulation. 5.  Hyperlipidemia: Continue statins 6.  Diet-controlled DM2: A1c 4.9.  In the hospital, treated with SSI.  No  antidiabetics at discharge and expect glycemic control to improve following taper of steroids. 7.  Pressure Injury 03/06/21 Coccyx Medial Stage 1 -  Intact skin with non-blanchable redness of a localized area usually over a bony prominence. red area with small amount that does not blanch (Active)  03/06/21 2030  Location: Coccyx  Location Orientation: Medial  Staging: Stage 1 -  Intact skin with non-blanchable redness of a localized area usually over a bony prominence.  Wound Description (Comments): red area with small amount that does not blanch  Present on Admission: Yes   Disposition: Discharge home with home health therapies and DME's (already ordered) after completion of last dose of XRT today.  Vernell Leep, MD,  FACP, [facility], [facility], [facility] (Care Management Physician Certified) [facility] [place]  To contact the attending provider between 7A-7P or the covering provider during after hours 7P-7A, please log into the web site [email or web address] and access using universal [place] password for that web site. If you do not have the password, please call the hospital operator.

## 2021-03-20 ENCOUNTER — Telehealth: Payer: Self-pay

## 2021-03-20 NOTE — Telephone Encounter (Signed)
Correction appt made 12/16

## 2021-03-20 NOTE — Telephone Encounter (Addendum)
Transition Care Management Follow-up Telephone Call Date of discharge and from where: 03/19/21 from [facility] How have you been since you were released from the hospital? Patient states still feeling a little tired from radiation treatments. Any questions or concerns? No  Items Reviewed: Did the pt receive and understand the discharge instructions provided?  Reviewed discharge instructions with patient Medications obtained and verified? Yes  Other? No  Any new allergies since your discharge? No  Dietary orders reviewed? Yes Do you have support at home? Yes   Home Care and Equipment/Supplies: Were home health services ordered? yes If so, what is the name of the agency? [facility]  Has the agency set up a time to come to the patient's home? yes Were any new equipment or medical supplies ordered?  Yes: , rollated walker and shower chair What is the name of the medical supply agency? Patient received equipment from the hospital  Were you able to get the supplies/equipment? not applicable Do you have any questions related to the use of the equipment or supplies? No  Functional Questionnaire: (I = Independent and D = Dependent) ADLs: I with assistance  Bathing/Dressing- I  Meal Prep- I with assistance  Eating- I  Maintaining continence- I  Transferring/Ambulation- I with assistance, patient using rollated walker  Managing Meds- I with assistance  Follow up appointments reviewed:  PCP Hospital f/u appt confirmed? Yes  Scheduled to see [name] on 03/28/21 @ 11:000am. [facility] f/u appt confirmed? No   Are transportation arrangements needed? No  If their condition worsens, is the pt aware to call PCP or go to the Emergency Dept.? Yes Was the patient provided with contact information for the PCP's office or ED? Yes Was to pt encouraged to call back with questions or concerns? Yes

## 2021-03-21 ENCOUNTER — Telehealth: Payer: Self-pay | Admitting: Nurse Practitioner

## 2021-03-21 ENCOUNTER — Telehealth: Payer: Self-pay

## 2021-03-21 ENCOUNTER — Ambulatory Visit: Payer: PPO | Admitting: Family Medicine

## 2021-03-21 ENCOUNTER — Telehealth: Payer: Self-pay | Admitting: Family Medicine

## 2021-03-21 DIAGNOSIS — Z8701 Personal history of pneumonia (recurrent): Secondary | ICD-10-CM | POA: Diagnosis not present

## 2021-03-21 DIAGNOSIS — E1151 Type 2 diabetes mellitus with diabetic peripheral angiopathy without gangrene: Secondary | ICD-10-CM | POA: Diagnosis not present

## 2021-03-21 DIAGNOSIS — E785 Hyperlipidemia, unspecified: Secondary | ICD-10-CM | POA: Diagnosis not present

## 2021-03-21 DIAGNOSIS — Z9181 History of falling: Secondary | ICD-10-CM | POA: Diagnosis not present

## 2021-03-21 DIAGNOSIS — G9340 Encephalopathy, unspecified: Secondary | ICD-10-CM | POA: Diagnosis not present

## 2021-03-21 DIAGNOSIS — C7931 Secondary malignant neoplasm of brain: Secondary | ICD-10-CM | POA: Diagnosis not present

## 2021-03-21 DIAGNOSIS — C3491 Malignant neoplasm of unspecified part of right bronchus or lung: Secondary | ICD-10-CM | POA: Diagnosis not present

## 2021-03-21 DIAGNOSIS — M1990 Unspecified osteoarthritis, unspecified site: Secondary | ICD-10-CM | POA: Diagnosis not present

## 2021-03-21 DIAGNOSIS — Z7952 Long term (current) use of systemic steroids: Secondary | ICD-10-CM | POA: Diagnosis not present

## 2021-03-21 DIAGNOSIS — Z86718 Personal history of other venous thrombosis and embolism: Secondary | ICD-10-CM | POA: Diagnosis not present

## 2021-03-21 DIAGNOSIS — Z87891 Personal history of nicotine dependence: Secondary | ICD-10-CM | POA: Diagnosis not present

## 2021-03-21 DIAGNOSIS — E1169 Type 2 diabetes mellitus with other specified complication: Secondary | ICD-10-CM | POA: Diagnosis not present

## 2021-03-21 DIAGNOSIS — I10 Essential (primary) hypertension: Secondary | ICD-10-CM | POA: Diagnosis not present

## 2021-03-21 DIAGNOSIS — K449 Diaphragmatic hernia without obstruction or gangrene: Secondary | ICD-10-CM | POA: Diagnosis not present

## 2021-03-21 NOTE — Telephone Encounter (Signed)
Scheduled per 12/8 secure chat, pt has been called and sister-in-law confirmed appt

## 2021-03-21 NOTE — Telephone Encounter (Signed)
Noted  

## 2021-03-21 NOTE — Telephone Encounter (Signed)
Donald OT from [place] called in wanted to let Dr Diona Browner know pt resting heart rate 106 no complaints . Perimeter to notify PCP is 100 wants to increase perimeter notification #[phone]

## 2021-03-21 NOTE — Telephone Encounter (Signed)
Pts friend, Clabe Seal, [place] request a call back to advise what time pt should take her pepcid and what rxs she was d/c.   I have called  them back and since pt c/o GERD sx at night, she was advised to take it about 47min before her last meal to see if that helps.  Also, per the hospital discharge summary:  STOP taking these medications     apixaban 5 MG Tabs tablet Commonly known as: ELIQUIS    levETIRAcetam 500 MG tablet Commonly known as: Keppra    lisinopril 2.5 MG tablet Commonly known as: ZESTRIL     I have also reviewed all her upcoming appts at this time. They expressed understanding of this information.

## 2021-03-24 ENCOUNTER — Telehealth: Payer: Self-pay | Admitting: Family Medicine

## 2021-03-24 DIAGNOSIS — C7931 Secondary malignant neoplasm of brain: Secondary | ICD-10-CM | POA: Diagnosis not present

## 2021-03-24 DIAGNOSIS — C3490 Malignant neoplasm of unspecified part of unspecified bronchus or lung: Secondary | ICD-10-CM | POA: Diagnosis not present

## 2021-03-24 NOTE — Telephone Encounter (Signed)
Left voicemail for Katelyn Kennedy with Katelyn Kennedy giving verbal orders for OT one x week for one week, then two x a week one week, then one x week one week for ADL, ADL transfers exercise and pain control evaluated on the 9th.

## 2021-03-24 NOTE — Telephone Encounter (Signed)
Home Health verbal orders [place] Name:Don Agency Name: [facility] number: [phone]  Requesting OT/PT/Skilled nursing/Social Work/Speech:  Reason:occupational therapy  Frequency:one week one, two week one, one week one for adl, iadl transfers exercise and pain control evaluated on the 9th  Please forward to [facility] pool or providers CMA

## 2021-03-25 NOTE — Telephone Encounter (Signed)
Agreed -

## 2021-03-28 ENCOUNTER — Ambulatory Visit (INDEPENDENT_AMBULATORY_CARE_PROVIDER_SITE_OTHER): Payer: PPO | Admitting: Family Medicine

## 2021-03-28 ENCOUNTER — Inpatient Hospital Stay: Payer: PPO | Attending: Internal Medicine | Admitting: Nurse Practitioner

## 2021-03-28 ENCOUNTER — Other Ambulatory Visit: Payer: Self-pay

## 2021-03-28 VITALS — BP 122/64 | HR 103 | Temp 97.7°F | Resp 18 | Ht 59.0 in | Wt 135.2 lb

## 2021-03-28 DIAGNOSIS — C3490 Malignant neoplasm of unspecified part of unspecified bronchus or lung: Secondary | ICD-10-CM

## 2021-03-28 DIAGNOSIS — R531 Weakness: Secondary | ICD-10-CM | POA: Diagnosis not present

## 2021-03-28 DIAGNOSIS — Z515 Encounter for palliative care: Secondary | ICD-10-CM | POA: Diagnosis not present

## 2021-03-28 DIAGNOSIS — I160 Hypertensive urgency: Secondary | ICD-10-CM

## 2021-03-28 DIAGNOSIS — Z87891 Personal history of nicotine dependence: Secondary | ICD-10-CM | POA: Diagnosis not present

## 2021-03-28 DIAGNOSIS — Z7901 Long term (current) use of anticoagulants: Secondary | ICD-10-CM | POA: Diagnosis not present

## 2021-03-28 DIAGNOSIS — Z7952 Long term (current) use of systemic steroids: Secondary | ICD-10-CM | POA: Diagnosis not present

## 2021-03-28 DIAGNOSIS — C3432 Malignant neoplasm of lower lobe, left bronchus or lung: Secondary | ICD-10-CM | POA: Diagnosis not present

## 2021-03-28 DIAGNOSIS — R110 Nausea: Secondary | ICD-10-CM | POA: Diagnosis not present

## 2021-03-28 DIAGNOSIS — I10 Essential (primary) hypertension: Secondary | ICD-10-CM | POA: Diagnosis not present

## 2021-03-28 DIAGNOSIS — T380X5A Adverse effect of glucocorticoids and synthetic analogues, initial encounter: Secondary | ICD-10-CM

## 2021-03-28 DIAGNOSIS — R739 Hyperglycemia, unspecified: Secondary | ICD-10-CM | POA: Diagnosis not present

## 2021-03-28 DIAGNOSIS — C7931 Secondary malignant neoplasm of brain: Secondary | ICD-10-CM | POA: Insufficient documentation

## 2021-03-28 DIAGNOSIS — L89309 Pressure ulcer of unspecified buttock, unspecified stage: Secondary | ICD-10-CM | POA: Diagnosis not present

## 2021-03-28 DIAGNOSIS — E119 Type 2 diabetes mellitus without complications: Secondary | ICD-10-CM

## 2021-03-28 DIAGNOSIS — C3491 Malignant neoplasm of unspecified part of right bronchus or lung: Secondary | ICD-10-CM | POA: Diagnosis not present

## 2021-03-28 DIAGNOSIS — Z9181 History of falling: Secondary | ICD-10-CM

## 2021-03-28 DIAGNOSIS — Z7189 Other specified counseling: Secondary | ICD-10-CM | POA: Diagnosis not present

## 2021-03-28 DIAGNOSIS — G9340 Encephalopathy, unspecified: Secondary | ICD-10-CM | POA: Diagnosis not present

## 2021-03-28 DIAGNOSIS — K449 Diaphragmatic hernia without obstruction or gangrene: Secondary | ICD-10-CM | POA: Diagnosis not present

## 2021-03-28 DIAGNOSIS — Z8701 Personal history of pneumonia (recurrent): Secondary | ICD-10-CM | POA: Diagnosis not present

## 2021-03-28 DIAGNOSIS — E1151 Type 2 diabetes mellitus with diabetic peripheral angiopathy without gangrene: Secondary | ICD-10-CM | POA: Diagnosis not present

## 2021-03-28 DIAGNOSIS — E785 Hyperlipidemia, unspecified: Secondary | ICD-10-CM | POA: Diagnosis not present

## 2021-03-28 DIAGNOSIS — R530 Neoplastic (malignant) related fatigue: Secondary | ICD-10-CM | POA: Diagnosis not present

## 2021-03-28 DIAGNOSIS — M1990 Unspecified osteoarthritis, unspecified site: Secondary | ICD-10-CM | POA: Diagnosis not present

## 2021-03-28 DIAGNOSIS — Z86718 Personal history of other venous thrombosis and embolism: Secondary | ICD-10-CM

## 2021-03-28 DIAGNOSIS — E1169 Type 2 diabetes mellitus with other specified complication: Secondary | ICD-10-CM | POA: Diagnosis not present

## 2021-03-28 LAB — GLUCOSE, POCT (MANUAL RESULT ENTRY): POC Glucose: 214 mg/dl — AB (ref 70–99)

## 2021-03-28 NOTE — Assessment & Plan Note (Signed)
Worsened control on decadron but hopefully pt only has to continue this for 2 more week.  Recommended low car diet.  Follow up in 6 weeks for re-eval.

## 2021-03-28 NOTE — Assessment & Plan Note (Signed)
Good control on current medicaitons.

## 2021-03-28 NOTE — Patient Instructions (Addendum)
Limit sweets, decrease carbohydrates while on decadron taper.  Get flu shot .. regular dose given past issue  after steroids.

## 2021-03-28 NOTE — Assessment & Plan Note (Signed)
Resolved. Pt is rotating positions as much as able.

## 2021-03-28 NOTE — Assessment & Plan Note (Signed)
Has appt today with Oncology.  Pt has home health services set up and seem to be doing well with PT.  Closer to baseline mental status with decadron.

## 2021-03-28 NOTE — Progress Notes (Signed)
[place]  [phone] [phone]) [phone]   Name: Katelyn Kennedy Date: 03/28/2021 MRN: [id number]  DOB: [birth year]  Patient Care Team: [name], MD as PCP - General [name], [facility] as Pharmacist (Pharmacist) Gery Pray, MD as Consulting Physician (Radiation Oncology)   I connected with Katelyn Kennedy on 03/28/21 at  1:30 PM EST by phone and verified that I am speaking with the correct person using two identifiers.   I discussed the limitations, risks, security and privacy concerns of performing an evaluation and management service by telemedicine and the availability of in-person appointments. I also discussed with the patient that there may be a patient responsible charge related to this service. The patient expressed understanding and agreed to proceed.   Other persons participating in the visit and their role in the encounter: N/A   Patients location: Home   Providers location: [place]   Chief Complaint: Hospital Follow-up    [place] consult requested for goals of care discussion in this 75 y.o. 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  She was admitted on 03/05/2021 from home with worsening left-sided weakness.  CT of head showed progression of metastatic lung cancer with multiple hemorrhagic lesions and vasogenic edema.  Patient followed by Dr. Sondra Come ([facility]) and Dr. Julien Nordmann. Recently completed whole brain radiation.   SOCIAL HISTORY:     reports that she quit smoking about 3 years ago. Her smoking use included cigarettes. She has a 20.00 pack-year smoking history. She has never used smokeless tobacco. She reports that she does not currently use alcohol. She reports that she does not use  drugs.  ADVANCE DIRECTIVES:  Completed during recent hospitalization. Patient named her sister Katelyn Kennedy as her primary medical decision maker given her daughters both live out of state.  CODE STATUS: DNR  PAST MEDICAL HISTORY: Past Medical History:  Diagnosis Date   Allergic rhinitis    Arthritis    Diabetes mellitus without complication ([place])    diet control/no meds per pt   Dyspnea    Essential hypertension 04/11/2019   History of chicken pox    History of hiatal hernia    lung ca dx'd 04/2017   Peripheral vascular disease ([place])    Pneumonia    PONV (postoperative nausea and vomiting)    Smoker      HEMATOLOGY/ONCOLOGY HISTORY:  Oncology History  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 ([place])  04/29/2017 Initial Diagnosis   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 ([place])   09/21/2017 -  Chemotherapy   The patient had durvalumab (IMFINZI) 600 mg in sodium chloride 0.9 % 100 mL chemo infusion, 10.5 mg/kg = 560 mg, Intravenous,  Once, 26 of 26 cycles Administration: 600 mg (09/21/2017), 600 mg (12/16/2017), 620 mg (12/30/2017), 620 mg (01/13/2018), 620 mg (01/27/2018), 620 mg (02/10/2018), 620 mg (02/23/2018), 600 mg (10/07/2017), 620 mg (03/09/2018), 600 mg (10/21/2017), 600 mg (11/04/2017), 600 mg (11/18/2017), 600 mg (12/02/2017), 620 mg (03/24/2018), 620 mg (04/05/2018), 620 mg (05/04/2018), 620 mg (04/21/2018), 620 mg (05/18/2018), 620 mg (06/01/2018), 620 mg (06/15/2018), 620 mg (06/29/2018), 620 mg (07/13/2018), 620 mg (07/27/2018), 620 mg (08/11/2018), 620 mg (08/24/2018), 620 mg (09/08/2018)   for chemotherapy treatment.     05/14/2020 Cancer Staging   Staging form:  Lung, AJCC 8th Edition - Clinical: Stage IIIA (cT2a, cN2, cM0) - Signed by Curt Bears, MD on 05/14/2020      ALLERGIES:  is allergic to codeine.  MEDICATIONS:  Current Outpatient Medications  Medication Sig Dispense Refill   amitriptyline (ELAVIL) 50 MG tablet Take 1 tablet (50 mg total) by  mouth at bedtime. 90 tablet 3   atorvastatin (LIPITOR) 40 MG tablet 1 tablet  po daily (Patient taking differently: Take 40 mg by mouth daily.) 90 tablet 3   Calcium Citrate-Vitamin D (CITRACAL + D PO) Take 1 tablet by mouth daily.     cholecalciferol (VITAMIN D) 25 MCG (1000 UNIT) tablet Take 1,000 Units by mouth daily.     dexamethasone (DECADRON) 2 MG tablet 4 mg 3 times a day for 5 days, then 4 mg twice a day for 5 days, then 4 mg daily for 5 days, then 2 mg daily for 5 days, then 2 mg every other day for 6 days then stop 60 tablet 0   famotidine (PEPCID) 20 MG tablet Take 1 tablet (20 mg total) by mouth daily. 30 tablet 0   Multiple Vitamin (MULTIVITAMIN) capsule Take 1 capsule by mouth daily. Centrum Silver     polyethylene glycol (MIRALAX / GLYCOLAX) 17 g packet Take 17 g by mouth daily as needed for moderate constipation. 14 each 0   vitamin B-12 (CYANOCOBALAMIN) 1000 MCG tablet Take 1,000 mcg by mouth daily.     No current facility-administered medications for this visit.    VITAL SIGNS: There were no vitals taken for this visit. There were no vitals filed for this visit.  Estimated body mass index is 27.32 kg/m as calculated from the following:   Height as of an earlier encounter on 03/28/21: 4\' 11"  (1.499 m).   Weight as of an earlier encounter on 03/28/21: 135 lb 4 oz (61.3 kg).  LABS: CBC:    Component Value Date/Time   WBC 12.6 (H) 03/18/2021 0506   HGB 12.9 03/18/2021 0506   HGB 10.6 (L) 12/24/2020 1425   HCT 38.0 03/18/2021 0506   PLT 160 03/18/2021 0506   PLT 327 12/24/2020 1425   MCV 91.8 03/18/2021 0506   NEUTROABS 6.4 03/10/2021 0459   LYMPHSABS 1.0 03/10/2021 0459   MONOABS 0.5 03/10/2021 0459   EOSABS 0.0 03/10/2021 0459   BASOSABS 0.0 03/10/2021 0459   Comprehensive Metabolic Panel:    Component Value Date/Time   NA 136 03/18/2021 0506   K 4.3 03/18/2021 0506   CL 106 03/18/2021 0506   CO2 24 03/18/2021 0506   BUN 38 (H) 03/18/2021 0506    CREATININE 0.49 03/18/2021 0506   CREATININE 0.73 12/24/2020 1425   GLUCOSE 158 (H) 03/18/2021 0506   CALCIUM 8.6 (L) 03/18/2021 0506   AST 25 03/05/2021 1645   AST 18 12/24/2020 1425   ALT 17 03/05/2021 1645   ALT 19 12/24/2020 1425   ALKPHOS 117 03/05/2021 1645   BILITOT 0.7 03/05/2021 1645   BILITOT 0.4 12/24/2020 1425   PROT 5.8 (L) 03/05/2021 1645   ALBUMIN 3.3 (L) 03/05/2021 1645     PERFORMANCE STATUS (ECOG) : 1 - Symptomatic but completely ambulatory    IMPRESSION:  Ms. Ludwick verbalizes her appreciation for being out at the hospital and would seems to be ongoing improvement.  She reports having a visit with her primary care provider earlier who also acknowledged she was doing much better.  Denies pain or shortness of breath.  Alert and oriented to all  questions.  She states she is able to do more around her home than she has been in months.  Able to perform all ADLs independently.  Is ambulatory around her home utilizing a Rollator.  Her 2 sisters in law have been her biggest support is and assist with errands and taking her to appointments.  States that she is feeling much stronger and overcoming the fatigue that she encountered with radiation.  Ms. Khiev states her appetite is good.  Denies pain, constipation, and nausea.  Is taking Pepcid as needed for indigestion.  She continues to remain hopeful for improvement and to continue to feel well.  I discussed the importance of continued conversation with family and their medical providers regarding overall plan of care and treatment options, ensuring decisions are within the context of the patients values and GOCs.  PLAN: Patient feeling much better.  States she is a lot stronger and able to perform all ADLs independently.  No complaints at this time.  Is remaining hopeful for continued stability and improvement in her quality of life. We will plan to see her in the clinic in 3-4 weeks in collaboration with her other  oncology appointments.   Patient expressed understanding and was in agreement with this plan. She also understands that She can call the clinic at any time with any questions, concerns, or complaints.     Time Total: 40 min.   Visit consisted of counseling and education dealing with the complex and emotionally intense issues of symptom management and palliative care in the setting of serious and potentially life-threatening illness.Greater than 50%  of this time was spent counseling and coordinating care related to the above assessment and plan.  Signed by: Alda Lea, AGPCNP-BC Palliative Medicine Team

## 2021-03-28 NOTE — Progress Notes (Signed)
Patient ID: Katelyn Kennedy, female    DOB: February 22, 1946, 75 y.o.   MRN: [id number]  This visit was conducted in person.  BP 122/64    Pulse (!) 103    Temp 97.7 F (36.5 C)    Resp 18    Ht 4\' 11"  (1.499 m)    Wt 135 lb 4 oz (61.3 kg)    SpO2 97%    BMI 27.32 kg/m    CC:  Chief Complaint  Patient presents with   Hospitalization Follow-up    Subjective:   HPI: Katelyn Kennedy is a 75 y.o. 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 Admitted 03/05/2021  Discharged 12/7  Hospital Course: copied from discharge note for informational purposes Past medical history of NSCLC with mets to brain, residual left-sided weakness 7/22 resection of the right posterior frontal dural based mass. 9/22 acute DVT. 03/03/2021 admitted to the hospital under ICU.  Neurosurgery consulted radiation oncology consulted. 11/25 transferred to [facility] for ongoing radiation therapy. 11/27 transferred to hospitalist service. Date of completion of radiation therapy 12/7  * Intraparenchymal hemorrhage of brain ([place]) with midline shift and vasogenic edema Presented with left-sided weakness. CT head shows evidence of progression of metastatic disease with new hemorrhagic lesions along with vasogenic edema and midline shift of 0.8 cm. MRI brain also shows dural spread of the tumor, also shows worsening midline shift of 1.3 cm and small volume subarachnoid and subdural hemorrhage as well. Neurosurgery consulted.  Recommend close monitoring.  Hold anticoagulation.  And Decadron 4 mg every 6 hours.  Patient was started on Keppra for seizure prophylaxis.  Completed 7-day therapy. No intervention recommended by neurosurgery. Radiation oncology was consulted, patient started on whole brain radiation therapy. Completion date 12/7. Patient will complete therapy in the  hospital. Steroid management and taper per radiation oncology. Medical oncology also consulted.   Lung cancer metastatic to brain ([place]) Stage IV non-small cell lung cancer. S/P chemoradiation to the lung. Appears to have brain metastasis SP stereotactic radiosurgery in August. Oncology following. Palliative care will be helpful for patient.   Hypertensive urgency Blood pressure stable.   Holding antihypertensive regimen for now.   Acute encephalopathy Secondary to hemorrhage. No evidence of metabolic component. Improved.   Chronic anticoagulation History of DVT in September 22. Eliquis on hold secondary to bleeding. Dopplers negative for DVT. Currently oncology recommended to stay completely off of the anticoagulation.   Hyperlipidemia associated with type 2 diabetes mellitus (HCC) Continue statin.   Diet-controlled type 2 diabetes mellitus without any complication Hemoglobin P5W 4.9. Diet controlled. Steroid-induced hyperglycemia. In the hospital treated with sliding scale insulin. Expectation is that sugar control will improve with tapering of Decadron.   Pressure injury of skin Medial coccyx stage I.  Present on admission. Continue foam dressing.     Today 03/28/21   She reports  she has complete whole brain radiation last week.   She continues to have decreased strength more on right side, still issues with confusion. Home Health PT, OT and RN.  Family got her a pill box to help with keeping up with medications.  Sister in law is at [employer] today helping her. She is living at home in her appointment.  She is feeding self.. family is cooking and shopping for her.  She is dressing  herself.  She is toileting okay.  Family is installing  a grab bar in bathroom. She has shower chair.   Tail bone ulcer has resolved.   Glucose was high with decadron in hospital 148-260.  Eating well overall.. but has lots of sweets.     Wt Readings from Last 3 Encounters:   03/28/21 135 lb 4 oz (61.3 kg)  03/19/21 137 lb 4.8 oz (62.3 kg)  12/26/20 138 lb 8 oz (62.8 kg)     Relevant past medical, surgical, family and social history reviewed and updated as indicated. Interim medical history since our last visit reviewed. Allergies and medications reviewed and updated. Outpatient Medications Prior to Visit  Medication Sig Dispense Refill   amitriptyline (ELAVIL) 50 MG tablet Take 1 tablet (50 mg total) by mouth at bedtime. 90 tablet 3   atorvastatin (LIPITOR) 40 MG tablet 1 tablet  po daily (Patient taking differently: Take 40 mg by mouth daily.) 90 tablet 3   Calcium Citrate-Vitamin D (CITRACAL + D PO) Take 1 tablet by mouth daily.     cholecalciferol (VITAMIN D) 25 MCG (1000 UNIT) tablet Take 1,000 Units by mouth daily.     dexamethasone (DECADRON) 2 MG tablet 4 mg 3 times a day for 5 days, then 4 mg twice a day for 5 days, then 4 mg daily for 5 days, then 2 mg daily for 5 days, then 2 mg every other day for 6 days then stop 60 tablet 0   famotidine (PEPCID) 20 MG tablet Take 1 tablet (20 mg total) by mouth daily. 30 tablet 0   Multiple Vitamin (MULTIVITAMIN) capsule Take 1 capsule by mouth daily. Centrum Silver     polyethylene glycol (MIRALAX / GLYCOLAX) 17 g packet Take 17 g by mouth daily as needed for moderate constipation. 14 each 0   vitamin B-12 (CYANOCOBALAMIN) 1000 MCG tablet Take 1,000 mcg by mouth daily.     acetaminophen (TYLENOL) 500 MG tablet Take 500 mg by mouth daily as needed for moderate pain or headache.     HYDROcodone-acetaminophen (NORCO/VICODIN) 5-325 MG tablet Take 1 tablet by mouth every 4 (four) hours as needed for moderate pain. 20 tablet 0   LORazepam (ATIVAN) 0.5 MG tablet 1 tab po 30 minutes prior to radiation or MRI scans 30 tablet 0   No facility-administered medications prior to visit.     Per HPI unless specifically indicated in ROS section below Review of Systems  Constitutional:  Negative for fatigue and fever.  HENT:   Negative for congestion.   Eyes:  Negative for pain.  Respiratory:  Negative for cough and shortness of breath.   Cardiovascular:  Negative for chest pain, palpitations and leg swelling.  Gastrointestinal:  Negative for abdominal pain.  Genitourinary:  Negative for dysuria and vaginal bleeding.  Musculoskeletal:  Negative for back pain.  Neurological:  Positive for weakness. Negative for syncope, light-headedness and headaches.  Psychiatric/Behavioral:  Negative for dysphoric mood.   Objective:  BP 122/64    Pulse (!) 103    Temp 97.7 F (36.5 C)    Resp 18    Ht 4\' 11"  (1.499 m)    Wt 135 lb 4 oz (61.3 kg)    SpO2 97%    BMI 27.32 kg/m   Wt Readings from Last 3 Encounters:  03/28/21 135 lb 4 oz (61.3 kg)  03/19/21 137 lb 4.8 oz (62.3 kg)  12/26/20 138 lb 8 oz (62.8 kg)      Physical Exam Constitutional:      General: She is not in acute  distress.    Appearance: Normal appearance. She is well-developed. She is not ill-appearing or toxic-appearing.  HENT:     Head: Normocephalic.     Right Ear: Hearing, tympanic membrane, ear canal and external ear normal. Tympanic membrane is not erythematous, retracted or bulging.     Left Ear: Hearing, tympanic membrane, ear canal and external ear normal. Tympanic membrane is not erythematous, retracted or bulging.     Nose: No mucosal edema or rhinorrhea.     Right Sinus: No maxillary sinus tenderness or frontal sinus tenderness.     Left Sinus: No maxillary sinus tenderness or frontal sinus tenderness.     Mouth/Throat:     Pharynx: Uvula midline.  Eyes:     General: Lids are normal. Lids are everted, no foreign bodies appreciated.     Conjunctiva/sclera: Conjunctivae normal.     Pupils: Pupils are equal, round, and reactive to light.  Neck:     Thyroid: No thyroid mass or thyromegaly.     Vascular: No carotid bruit.     Trachea: Trachea normal.  Cardiovascular:     Rate and Rhythm: Normal rate and regular rhythm.     Pulses: Normal  pulses.     Heart sounds: Normal heart sounds, S1 normal and S2 normal. No murmur heard.   No friction rub. No gallop.  Pulmonary:     Effort: Pulmonary effort is normal. No tachypnea or respiratory distress.     Breath sounds: Normal breath sounds. No decreased breath sounds, wheezing, rhonchi or rales.  Abdominal:     General: Bowel sounds are normal.     Palpations: Abdomen is soft.     Tenderness: There is no abdominal tenderness.  Musculoskeletal:     Cervical back: Normal range of motion and neck supple.  Skin:    General: Skin is warm and dry.     Findings: No rash.  Neurological:     Mental Status: She is alert. Mental status is at baseline.     Sensory: No sensory deficit.     Motor: Weakness present.     Gait: Gait abnormal.  Psychiatric:        Mood and Affect: Mood normal. Mood is not anxious or depressed.        Speech: Speech normal.        Behavior: Behavior normal. Behavior is cooperative.        Thought Content: Thought content normal.        Judgment: Judgment normal.      Results for orders placed or performed during the hospital encounter of 03/05/21  Resp Panel by RT-PCR (Flu A&B, Covid) Nasopharyngeal Swab   Specimen: Nasopharyngeal Swab; Nasopharyngeal(NP) swabs in vial transport medium  Result Value Ref Range   SARS Coronavirus 2 by RT PCR NEGATIVE NEGATIVE   Influenza A by PCR NEGATIVE NEGATIVE   Influenza B by PCR NEGATIVE NEGATIVE  MRSA Next Gen by PCR, Nasal   Specimen: Nasal Mucosa; Nasal Swab  Result Value Ref Range   MRSA by PCR Next Gen NOT DETECTED NOT DETECTED  CBC with Differential  Result Value Ref Range   WBC 5.7 4.0 - 10.5 K/uL   RBC 3.62 (L) 3.87 - 5.11 MIL/uL   Hemoglobin 11.2 (L) 12.0 - 15.0 g/dL   HCT 34.0 (L) 36.0 - 46.0 %   MCV 93.9 80.0 - 100.0 fL   MCH 30.9 26.0 - 34.0 pg   MCHC 32.9 30.0 - 36.0 g/dL   RDW 12.4 11.5 -  15.5 %   Platelets 256 150 - 400 K/uL   nRBC 0.0 0.0 - 0.2 %   Neutrophils Relative % 60 %   Neutro Abs  3.4 1.7 - 7.7 K/uL   Lymphocytes Relative 28 %   Lymphs Abs 1.6 0.7 - 4.0 K/uL   Monocytes Relative 11 %   Monocytes Absolute 0.6 0.1 - 1.0 K/uL   Eosinophils Relative 1 %   Eosinophils Absolute 0.1 0.0 - 0.5 K/uL   Basophils Relative 0 %   Basophils Absolute 0.0 0.0 - 0.1 K/uL   Immature Granulocytes 0 %   Abs Immature Granulocytes 0.02 0.00 - 0.07 K/uL  Basic metabolic panel  Result Value Ref Range   Sodium 142 135 - 145 mmol/L   Potassium 3.6 3.5 - 5.1 mmol/L   Chloride 108 98 - 111 mmol/L   CO2 26 22 - 32 mmol/L   Glucose, Bld 119 (H) 70 - 99 mg/dL   BUN 5 (L) 8 - 23 mg/dL   Creatinine, Ser 0.70 0.44 - 1.00 mg/dL   Calcium 9.4 8.9 - 10.3 mg/dL   GFR, Estimated >60 >60 mL/min   Anion gap 8 5 - 15  Protime-INR  Result Value Ref Range   Prothrombin Time 14.4 11.4 - 15.2 seconds   INR 1.1 0.8 - 1.2  Magnesium  Result Value Ref Range   Magnesium 1.7 1.7 - 2.4 mg/dL  Hepatic function panel  Result Value Ref Range   Total Protein 5.8 (L) 6.5 - 8.1 g/dL   Albumin 3.3 (L) 3.5 - 5.0 g/dL   AST 25 15 - 41 U/L   ALT 17 0 - 44 U/L   Alkaline Phosphatase 117 38 - 126 U/L   Total Bilirubin 0.7 0.3 - 1.2 mg/dL   Bilirubin, Direct 0.1 0.0 - 0.2 mg/dL   Indirect Bilirubin 0.6 0.3 - 0.9 mg/dL  Heparin level - if patient on rivaroxaban (XARELTO) or apixaban (ELIQUIS)  Result Value Ref Range   Heparin Unfractionated 1.08 (H) 0.30 - 0.70 IU/mL  Hemoglobin A1c  Result Value Ref Range   Hgb A1c MFr Bld 4.9 4.8 - 5.6 %   Mean Plasma Glucose 93.93 mg/dL  CBC  Result Value Ref Range   WBC 6.5 4.0 - 10.5 K/uL   RBC 3.88 3.87 - 5.11 MIL/uL   Hemoglobin 12.0 12.0 - 15.0 g/dL   HCT 35.6 (L) 36.0 - 46.0 %   MCV 91.8 80.0 - 100.0 fL   MCH 30.9 26.0 - 34.0 pg   MCHC 33.7 30.0 - 36.0 g/dL   RDW 12.4 11.5 - 15.5 %   Platelets 283 150 - 400 K/uL   nRBC 0.0 0.0 - 0.2 %  Basic metabolic panel  Result Value Ref Range   Sodium 141 135 - 145 mmol/L   Potassium 3.9 3.5 - 5.1 mmol/L   Chloride  109 98 - 111 mmol/L   CO2 24 22 - 32 mmol/L   Glucose, Bld 199 (H) 70 - 99 mg/dL   BUN 7 (L) 8 - 23 mg/dL   Creatinine, Ser 0.70 0.44 - 1.00 mg/dL   Calcium 9.3 8.9 - 10.3 mg/dL   GFR, Estimated >60 >60 mL/min   Anion gap 8 5 - 15  Magnesium  Result Value Ref Range   Magnesium 2.3 1.7 - 2.4 mg/dL  Glucose, capillary  Result Value Ref Range   Glucose-Capillary 232 (H) 70 - 99 mg/dL  Glucose, capillary  Result Value Ref Range   Glucose-Capillary 154 (H)  70 - 99 mg/dL  Glucose, capillary  Result Value Ref Range   Glucose-Capillary 192 (H) 70 - 99 mg/dL  Glucose, capillary  Result Value Ref Range   Glucose-Capillary 218 (H) 70 - 99 mg/dL   Comment 1 Notify RN    Comment 2 Repeat Test   Glucose, capillary  Result Value Ref Range   Glucose-Capillary 170 (H) 70 - 99 mg/dL   Comment 1 Notify RN    Comment 2 Document in Chart   Glucose, capillary  Result Value Ref Range   Glucose-Capillary 75 70 - 99 mg/dL   Comment 1 Notify RN    Comment 2 Document in Chart   Glucose, capillary  Result Value Ref Range   Glucose-Capillary 179 (H) 70 - 99 mg/dL   Comment 1 Notify RN    Comment 2 Document in Chart   Glucose, capillary  Result Value Ref Range   Glucose-Capillary 160 (H) 70 - 99 mg/dL   Comment 1 Notify RN    Comment 2 Document in Chart   CBC  Result Value Ref Range   WBC 13.9 (H) 4.0 - 10.5 K/uL   RBC 3.76 (L) 3.87 - 5.11 MIL/uL   Hemoglobin 11.5 (L) 12.0 - 15.0 g/dL   HCT 35.7 (L) 36.0 - 46.0 %   MCV 94.9 80.0 - 100.0 fL   MCH 30.6 26.0 - 34.0 pg   MCHC 32.2 30.0 - 36.0 g/dL   RDW 12.6 11.5 - 15.5 %   Platelets 300 150 - 400 K/uL   nRBC 0.0 0.0 - 0.2 %  Basic metabolic panel  Result Value Ref Range   Sodium 142 135 - 145 mmol/L   Potassium 4.0 3.5 - 5.1 mmol/L   Chloride 110 98 - 111 mmol/L   CO2 25 22 - 32 mmol/L   Glucose, Bld 134 (H) 70 - 99 mg/dL   BUN 18 8 - 23 mg/dL   Creatinine, Ser 0.62 0.44 - 1.00 mg/dL   Calcium 9.3 8.9 - 10.3 mg/dL   GFR, Estimated  >60 >60 mL/min   Anion gap 7 5 - 15  Glucose, capillary  Result Value Ref Range   Glucose-Capillary 135 (H) 70 - 99 mg/dL   Comment 1 Notify RN    Comment 2 Document in Chart   Glucose, capillary  Result Value Ref Range   Glucose-Capillary 172 (H) 70 - 99 mg/dL  Glucose, capillary  Result Value Ref Range   Glucose-Capillary 151 (H) 70 - 99 mg/dL  Glucose, capillary  Result Value Ref Range   Glucose-Capillary 141 (H) 70 - 99 mg/dL   Comment 1 Notify RN    Comment 2 Document in Chart   Glucose, capillary  Result Value Ref Range   Glucose-Capillary 141 (H) 70 - 99 mg/dL   Comment 1 Notify RN    Comment 2 Document in Chart   Glucose, capillary  Result Value Ref Range   Glucose-Capillary 250 (H) 70 - 99 mg/dL   Comment 1 Notify RN    Comment 2 Document in Chart   Glucose, capillary  Result Value Ref Range   Glucose-Capillary 134 (H) 70 - 99 mg/dL   Comment 1 Notify RN   Glucose, capillary  Result Value Ref Range   Glucose-Capillary 193 (H) 70 - 99 mg/dL  Glucose, capillary  Result Value Ref Range   Glucose-Capillary 188 (H) 70 - 99 mg/dL  Glucose, capillary  Result Value Ref Range   Glucose-Capillary 129 (H) 70 - 99 mg/dL  Glucose, capillary  Result Value Ref Range   Glucose-Capillary 155 (H) 70 - 99 mg/dL  Glucose, capillary  Result Value Ref Range   Glucose-Capillary 115 (H) 70 - 99 mg/dL  Glucose, capillary  Result Value Ref Range   Glucose-Capillary 174 (H) 70 - 99 mg/dL   Comment 1 Notify RN    Comment 2 Document in Chart   Basic metabolic panel  Result Value Ref Range   Sodium 137 135 - 145 mmol/L   Potassium 4.0 3.5 - 5.1 mmol/L   Chloride 109 98 - 111 mmol/L   CO2 24 22 - 32 mmol/L   Glucose, Bld 163 (H) 70 - 99 mg/dL   BUN 23 8 - 23 mg/dL   Creatinine, Ser 0.63 0.44 - 1.00 mg/dL   Calcium 8.5 (L) 8.9 - 10.3 mg/dL   GFR, Estimated >60 >60 mL/min   Anion gap 4 (L) 5 - 15  CBC with Differential/Platelet  Result Value Ref Range   WBC 10.1 4.0 - 10.5  K/uL   RBC 3.80 (L) 3.87 - 5.11 MIL/uL   Hemoglobin 11.8 (L) 12.0 - 15.0 g/dL   HCT 36.0 36.0 - 46.0 %   MCV 94.7 80.0 - 100.0 fL   MCH 31.1 26.0 - 34.0 pg   MCHC 32.8 30.0 - 36.0 g/dL   RDW 12.6 11.5 - 15.5 %   Platelets 276 150 - 400 K/uL   nRBC 0.0 0.0 - 0.2 %   Neutrophils Relative % 85 %   Neutro Abs 8.6 (H) 1.7 - 7.7 K/uL   Lymphocytes Relative 10 %   Lymphs Abs 1.0 0.7 - 4.0 K/uL   Monocytes Relative 4 %   Monocytes Absolute 0.4 0.1 - 1.0 K/uL   Eosinophils Relative 0 %   Eosinophils Absolute 0.0 0.0 - 0.5 K/uL   Basophils Relative 0 %   Basophils Absolute 0.0 0.0 - 0.1 K/uL   Immature Granulocytes 1 %   Abs Immature Granulocytes 0.09 (H) 0.00 - 0.07 K/uL  Magnesium  Result Value Ref Range   Magnesium 1.9 1.7 - 2.4 mg/dL  Glucose, capillary  Result Value Ref Range   Glucose-Capillary 173 (H) 70 - 99 mg/dL   Comment 1 Notify RN    Comment 2 Document in Chart   Glucose, capillary  Result Value Ref Range   Glucose-Capillary 227 (H) 70 - 99 mg/dL   Comment 1 Notify RN    Comment 2 Document in Chart   Glucose, capillary  Result Value Ref Range   Glucose-Capillary 159 (H) 70 - 99 mg/dL  Basic metabolic panel  Result Value Ref Range   Sodium 140 135 - 145 mmol/L   Potassium 4.4 3.5 - 5.1 mmol/L   Chloride 110 98 - 111 mmol/L   CO2 26 22 - 32 mmol/L   Glucose, Bld 172 (H) 70 - 99 mg/dL   BUN 25 (H) 8 - 23 mg/dL   Creatinine, Ser 0.68 0.44 - 1.00 mg/dL   Calcium 8.9 8.9 - 10.3 mg/dL   GFR, Estimated >60 >60 mL/min   Anion gap 4 (L) 5 - 15  CBC with Differential/Platelet  Result Value Ref Range   WBC 8.0 4.0 - 10.5 K/uL   RBC 3.82 (L) 3.87 - 5.11 MIL/uL   Hemoglobin 11.7 (L) 12.0 - 15.0 g/dL   HCT 35.4 (L) 36.0 - 46.0 %   MCV 92.7 80.0 - 100.0 fL   MCH 30.6 26.0 - 34.0 pg   MCHC 33.1 30.0 - 36.0  g/dL   RDW 12.5 11.5 - 15.5 %   Platelets 268 150 - 400 K/uL   nRBC 0.0 0.0 - 0.2 %   Neutrophils Relative % 80 %   Neutro Abs 6.4 1.7 - 7.7 K/uL   Lymphocytes  Relative 12 %   Lymphs Abs 1.0 0.7 - 4.0 K/uL   Monocytes Relative 6 %   Monocytes Absolute 0.5 0.1 - 1.0 K/uL   Eosinophils Relative 0 %   Eosinophils Absolute 0.0 0.0 - 0.5 K/uL   Basophils Relative 0 %   Basophils Absolute 0.0 0.0 - 0.1 K/uL   Immature Granulocytes 2 %   Abs Immature Granulocytes 0.15 (H) 0.00 - 0.07 K/uL  Magnesium  Result Value Ref Range   Magnesium 2.1 1.7 - 2.4 mg/dL  Glucose, capillary  Result Value Ref Range   Glucose-Capillary 162 (H) 70 - 99 mg/dL   Comment 1 Notify RN   Glucose, capillary  Result Value Ref Range   Glucose-Capillary 168 (H) 70 - 99 mg/dL   Comment 1 Notify RN   Glucose, capillary  Result Value Ref Range   Glucose-Capillary 117 (H) 70 - 99 mg/dL  Glucose, capillary  Result Value Ref Range   Glucose-Capillary 292 (H) 70 - 99 mg/dL   Comment 1 Notify RN    Comment 2 Document in Chart   Glucose, capillary  Result Value Ref Range   Glucose-Capillary 171 (H) 70 - 99 mg/dL   Comment 1 Notify RN   Glucose, capillary  Result Value Ref Range   Glucose-Capillary 245 (H) 70 - 99 mg/dL   Comment 1 Notify RN   Glucose, capillary  Result Value Ref Range   Glucose-Capillary 245 (H) 70 - 99 mg/dL   Comment 1 Notify RN   Basic metabolic panel  Result Value Ref Range   Sodium 138 135 - 145 mmol/L   Potassium 4.2 3.5 - 5.1 mmol/L   Chloride 106 98 - 111 mmol/L   CO2 26 22 - 32 mmol/L   Glucose, Bld 191 (H) 70 - 99 mg/dL   BUN 35 (H) 8 - 23 mg/dL   Creatinine, Ser 0.71 0.44 - 1.00 mg/dL   Calcium 8.9 8.9 - 10.3 mg/dL   GFR, Estimated >60 >60 mL/min   Anion gap 6 5 - 15  Glucose, capillary  Result Value Ref Range   Glucose-Capillary 164 (H) 70 - 99 mg/dL  Glucose, capillary  Result Value Ref Range   Glucose-Capillary 157 (H) 70 - 99 mg/dL  Glucose, capillary  Result Value Ref Range   Glucose-Capillary 247 (H) 70 - 99 mg/dL  Glucose, capillary  Result Value Ref Range   Glucose-Capillary 171 (H) 70 - 99 mg/dL  Glucose, capillary   Result Value Ref Range   Glucose-Capillary 213 (H) 70 - 99 mg/dL  Glucose, capillary  Result Value Ref Range   Glucose-Capillary 261 (H) 70 - 99 mg/dL  Glucose, capillary  Result Value Ref Range   Glucose-Capillary 264 (H) 70 - 99 mg/dL  Glucose, capillary  Result Value Ref Range   Glucose-Capillary 174 (H) 70 - 99 mg/dL   Comment 1 Notify RN   Glucose, capillary  Result Value Ref Range   Glucose-Capillary 261 (H) 70 - 99 mg/dL  Glucose, capillary  Result Value Ref Range   Glucose-Capillary 201 (H) 70 - 99 mg/dL   Comment 1 Notify RN   Glucose, capillary  Result Value Ref Range   Glucose-Capillary 383 (H) 70 - 99 mg/dL   Comment  1 Notify RN   Glucose, capillary  Result Value Ref Range   Glucose-Capillary 137 (H) 70 - 99 mg/dL   Comment 1 Notify RN   Basic metabolic panel  Result Value Ref Range   Sodium 136 135 - 145 mmol/L   Potassium 4.4 3.5 - 5.1 mmol/L   Chloride 103 98 - 111 mmol/L   CO2 24 22 - 32 mmol/L   Glucose, Bld 183 (H) 70 - 99 mg/dL   BUN 36 (H) 8 - 23 mg/dL   Creatinine, Ser 0.71 0.44 - 1.00 mg/dL   Calcium 9.0 8.9 - 10.3 mg/dL   GFR, Estimated >60 >60 mL/min   Anion gap 9 5 - 15  CBC  Result Value Ref Range   WBC 14.1 (H) 4.0 - 10.5 K/uL   RBC 4.13 3.87 - 5.11 MIL/uL   Hemoglobin 12.8 12.0 - 15.0 g/dL   HCT 38.5 36.0 - 46.0 %   MCV 93.2 80.0 - 100.0 fL   MCH 31.0 26.0 - 34.0 pg   MCHC 33.2 30.0 - 36.0 g/dL   RDW 12.7 11.5 - 15.5 %   Platelets 190 150 - 400 K/uL   nRBC 0.0 0.0 - 0.2 %  Glucose, capillary  Result Value Ref Range   Glucose-Capillary 193 (H) 70 - 99 mg/dL  Glucose, capillary  Result Value Ref Range   Glucose-Capillary 212 (H) 70 - 99 mg/dL  Glucose, capillary  Result Value Ref Range   Glucose-Capillary 170 (H) 70 - 99 mg/dL  Glucose, capillary  Result Value Ref Range   Glucose-Capillary 192 (H) 70 - 99 mg/dL  Glucose, capillary  Result Value Ref Range   Glucose-Capillary 331 (H) 70 - 99 mg/dL  Glucose, capillary  Result  Value Ref Range   Glucose-Capillary 191 (H) 70 - 99 mg/dL  Glucose, capillary  Result Value Ref Range   Glucose-Capillary 401 (H) 70 - 99 mg/dL  Glucose, capillary  Result Value Ref Range   Glucose-Capillary 61 (L) 70 - 99 mg/dL  Glucose, capillary  Result Value Ref Range   Glucose-Capillary 110 (H) 70 - 99 mg/dL  Glucose, capillary  Result Value Ref Range   Glucose-Capillary 235 (H) 70 - 99 mg/dL  Glucose, capillary  Result Value Ref Range   Glucose-Capillary 173 (H) 70 - 99 mg/dL   Comment 1 Notify RN   Glucose, capillary  Result Value Ref Range   Glucose-Capillary 238 (H) 70 - 99 mg/dL   Comment 1 Notify RN   Glucose, capillary  Result Value Ref Range   Glucose-Capillary 164 (H) 70 - 99 mg/dL   Comment 1 Notify RN   Basic metabolic panel  Result Value Ref Range   Sodium 136 135 - 145 mmol/L   Potassium 4.3 3.5 - 5.1 mmol/L   Chloride 106 98 - 111 mmol/L   CO2 24 22 - 32 mmol/L   Glucose, Bld 158 (H) 70 - 99 mg/dL   BUN 38 (H) 8 - 23 mg/dL   Creatinine, Ser 0.49 0.44 - 1.00 mg/dL   Calcium 8.6 (L) 8.9 - 10.3 mg/dL   GFR, Estimated >60 >60 mL/min   Anion gap 6 5 - 15  CBC  Result Value Ref Range   WBC 12.6 (H) 4.0 - 10.5 K/uL   RBC 4.14 3.87 - 5.11 MIL/uL   Hemoglobin 12.9 12.0 - 15.0 g/dL   HCT 38.0 36.0 - 46.0 %   MCV 91.8 80.0 - 100.0 fL   MCH 31.2 26.0 - 34.0 pg  MCHC 33.9 30.0 - 36.0 g/dL   RDW 12.9 11.5 - 15.5 %   Platelets 160 150 - 400 K/uL   nRBC 0.0 0.0 - 0.2 %  Glucose, capillary  Result Value Ref Range   Glucose-Capillary 216 (H) 70 - 99 mg/dL  Glucose, capillary  Result Value Ref Range   Glucose-Capillary 149 (H) 70 - 99 mg/dL  Glucose, capillary  Result Value Ref Range   Glucose-Capillary 227 (H) 70 - 99 mg/dL  Glucose, capillary  Result Value Ref Range   Glucose-Capillary 154 (H) 70 - 99 mg/dL   Comment 1 Notify RN   Glucose, capillary  Result Value Ref Range   Glucose-Capillary 260 (H) 70 - 99 mg/dL  Glucose, capillary  Result Value  Ref Range   Glucose-Capillary 148 (H) 70 - 99 mg/dL   Comment 1 Notify RN   Glucose, capillary  Result Value Ref Range   Glucose-Capillary 220 (H) 70 - 99 mg/dL   Comment 1 Notify RN   CBG monitoring, ED  Result Value Ref Range   Glucose-Capillary 136 (H) 70 - 99 mg/dL    This visit occurred during the SARS-CoV-2 public health emergency.  Safety protocols were in place, including screening questions prior to the visit, additional usage of staff PPE, and extensive cleaning of exam room while observing appropriate contact time as indicated for disinfecting solutions.   COVID 19 screen:  No recent travel or known exposure to COVID19 The patient denies respiratory symptoms of COVID 19 at this time. The importance of social distancing was discussed today.   Assessment and Plan    Problem List Items Addressed This Visit     Chronic anticoagulation    No longer on anticoagulant given intracranial bleed.      Diet-controlled type 2 diabetes mellitus without any complication    Worsened control on decadron but hopefully pt only has to continue this for 2 more week.  Recommended low car diet.  Follow up in 6 weeks for re-eval.      Hypertensive urgency    Good control on current medicaitons.      Lung cancer metastatic to brain [facility])    Has appt today with Oncology.  Pt has home health services set up and seem to be doing well with PT.  Closer to baseline mental status with decadron.        Pressure injury of skin    Resolved. Pt is rotating positions as much as able.      Other Visit Diagnoses     Steroid-induced hyperglycemia    -  Primary   Relevant Orders   POCT glucose (manual entry) (Completed)        Eliezer Lofts, MD

## 2021-03-28 NOTE — Assessment & Plan Note (Signed)
No longer on anticoagulant given intracranial bleed.

## 2021-03-31 ENCOUNTER — Emergency Department (HOSPITAL_COMMUNITY): Payer: PPO

## 2021-03-31 ENCOUNTER — Inpatient Hospital Stay (HOSPITAL_COMMUNITY)
Admission: EM | Admit: 2021-03-31 | Discharge: 2021-04-04 | DRG: 054 | Disposition: A | Discharge status: Skilled Nursing Facility | Payer: PPO | Attending: Family Medicine | Admitting: Family Medicine

## 2021-03-31 ENCOUNTER — Encounter (HOSPITAL_COMMUNITY): Payer: Self-pay

## 2021-03-31 DIAGNOSIS — Z923 Personal history of irradiation: Secondary | ICD-10-CM | POA: Diagnosis not present

## 2021-03-31 DIAGNOSIS — C7931 Secondary malignant neoplasm of brain: Secondary | ICD-10-CM | POA: Diagnosis not present

## 2021-03-31 DIAGNOSIS — W19XXXA Unspecified fall, initial encounter: Secondary | ICD-10-CM | POA: Diagnosis not present

## 2021-03-31 DIAGNOSIS — E876 Hypokalemia: Secondary | ICD-10-CM | POA: Diagnosis not present

## 2021-03-31 DIAGNOSIS — G4709 Other insomnia: Secondary | ICD-10-CM | POA: Diagnosis present

## 2021-03-31 DIAGNOSIS — M810 Age-related osteoporosis without current pathological fracture: Secondary | ICD-10-CM | POA: Diagnosis not present

## 2021-03-31 DIAGNOSIS — I951 Orthostatic hypotension: Secondary | ICD-10-CM | POA: Diagnosis present

## 2021-03-31 DIAGNOSIS — J309 Allergic rhinitis, unspecified: Secondary | ICD-10-CM | POA: Diagnosis not present

## 2021-03-31 DIAGNOSIS — M1611 Unilateral primary osteoarthritis, right hip: Secondary | ICD-10-CM | POA: Diagnosis not present

## 2021-03-31 DIAGNOSIS — J984 Other disorders of lung: Secondary | ICD-10-CM | POA: Diagnosis not present

## 2021-03-31 DIAGNOSIS — G936 Cerebral edema: Secondary | ICD-10-CM | POA: Diagnosis present

## 2021-03-31 DIAGNOSIS — I619 Nontraumatic intracerebral hemorrhage, unspecified: Secondary | ICD-10-CM | POA: Diagnosis present

## 2021-03-31 DIAGNOSIS — Z87891 Personal history of nicotine dependence: Secondary | ICD-10-CM

## 2021-03-31 DIAGNOSIS — Z8551 Personal history of malignant neoplasm of bladder: Secondary | ICD-10-CM | POA: Diagnosis not present

## 2021-03-31 DIAGNOSIS — Z885 Allergy status to narcotic agent status: Secondary | ICD-10-CM

## 2021-03-31 DIAGNOSIS — S0990XA Unspecified injury of head, initial encounter: Secondary | ICD-10-CM | POA: Diagnosis not present

## 2021-03-31 DIAGNOSIS — R29890 Loss of height: Secondary | ICD-10-CM | POA: Diagnosis not present

## 2021-03-31 DIAGNOSIS — L89151 Pressure ulcer of sacral region, stage 1: Secondary | ICD-10-CM | POA: Diagnosis not present

## 2021-03-31 DIAGNOSIS — I10 Essential (primary) hypertension: Secondary | ICD-10-CM | POA: Diagnosis not present

## 2021-03-31 DIAGNOSIS — Z79899 Other long term (current) drug therapy: Secondary | ICD-10-CM | POA: Diagnosis not present

## 2021-03-31 DIAGNOSIS — T1490XA Injury, unspecified, initial encounter: Secondary | ICD-10-CM | POA: Diagnosis not present

## 2021-03-31 DIAGNOSIS — Z66 Do not resuscitate: Secondary | ICD-10-CM | POA: Diagnosis present

## 2021-03-31 DIAGNOSIS — M5136 Other intervertebral disc degeneration, lumbar region: Secondary | ICD-10-CM | POA: Diagnosis not present

## 2021-03-31 DIAGNOSIS — W19XXXD Unspecified fall, subsequent encounter: Secondary | ICD-10-CM | POA: Diagnosis not present

## 2021-03-31 DIAGNOSIS — I739 Peripheral vascular disease, unspecified: Secondary | ICD-10-CM | POA: Diagnosis not present

## 2021-03-31 DIAGNOSIS — E1151 Type 2 diabetes mellitus with diabetic peripheral angiopathy without gangrene: Secondary | ICD-10-CM | POA: Diagnosis not present

## 2021-03-31 DIAGNOSIS — M5030 Other cervical disc degeneration, unspecified cervical region: Secondary | ICD-10-CM | POA: Diagnosis not present

## 2021-03-31 DIAGNOSIS — W1830XA Fall on same level, unspecified, initial encounter: Secondary | ICD-10-CM | POA: Diagnosis present

## 2021-03-31 DIAGNOSIS — C3490 Malignant neoplasm of unspecified part of unspecified bronchus or lung: Secondary | ICD-10-CM | POA: Diagnosis not present

## 2021-03-31 DIAGNOSIS — E1169 Type 2 diabetes mellitus with other specified complication: Secondary | ICD-10-CM | POA: Diagnosis present

## 2021-03-31 DIAGNOSIS — R531 Weakness: Secondary | ICD-10-CM | POA: Diagnosis not present

## 2021-03-31 DIAGNOSIS — G8194 Hemiplegia, unspecified affecting left nondominant side: Secondary | ICD-10-CM | POA: Diagnosis not present

## 2021-03-31 DIAGNOSIS — F5104 Psychophysiologic insomnia: Secondary | ICD-10-CM | POA: Diagnosis not present

## 2021-03-31 DIAGNOSIS — Z20822 Contact with and (suspected) exposure to covid-19: Secondary | ICD-10-CM | POA: Diagnosis not present

## 2021-03-31 DIAGNOSIS — I700 Atherosclerosis of aorta: Secondary | ICD-10-CM | POA: Diagnosis not present

## 2021-03-31 DIAGNOSIS — Z9889 Other specified postprocedural states: Secondary | ICD-10-CM | POA: Diagnosis not present

## 2021-03-31 DIAGNOSIS — Z85118 Personal history of other malignant neoplasm of bronchus and lung: Secondary | ICD-10-CM | POA: Diagnosis not present

## 2021-03-31 DIAGNOSIS — E785 Hyperlipidemia, unspecified: Secondary | ICD-10-CM | POA: Diagnosis present

## 2021-03-31 DIAGNOSIS — E119 Type 2 diabetes mellitus without complications: Secondary | ICD-10-CM | POA: Diagnosis not present

## 2021-03-31 DIAGNOSIS — E042 Nontoxic multinodular goiter: Secondary | ICD-10-CM | POA: Diagnosis not present

## 2021-03-31 DIAGNOSIS — E46 Unspecified protein-calorie malnutrition: Secondary | ICD-10-CM | POA: Diagnosis not present

## 2021-03-31 DIAGNOSIS — Y92009 Unspecified place in unspecified non-institutional (private) residence as the place of occurrence of the external cause: Secondary | ICD-10-CM | POA: Diagnosis not present

## 2021-03-31 DIAGNOSIS — Z043 Encounter for examination and observation following other accident: Secondary | ICD-10-CM | POA: Diagnosis not present

## 2021-03-31 DIAGNOSIS — J8410 Pulmonary fibrosis, unspecified: Secondary | ICD-10-CM | POA: Diagnosis not present

## 2021-03-31 DIAGNOSIS — R41841 Cognitive communication deficit: Secondary | ICD-10-CM | POA: Diagnosis not present

## 2021-03-31 DIAGNOSIS — S065X0A Traumatic subdural hemorrhage without loss of consciousness, initial encounter: Secondary | ICD-10-CM | POA: Diagnosis not present

## 2021-03-31 DIAGNOSIS — M6281 Muscle weakness (generalized): Secondary | ICD-10-CM | POA: Diagnosis not present

## 2021-03-31 DIAGNOSIS — Z1159 Encounter for screening for other viral diseases: Secondary | ICD-10-CM | POA: Diagnosis not present

## 2021-03-31 DIAGNOSIS — R000 Tachycardia, unspecified: Secondary | ICD-10-CM | POA: Diagnosis not present

## 2021-03-31 DIAGNOSIS — Z86718 Personal history of other venous thrombosis and embolism: Secondary | ICD-10-CM

## 2021-03-31 DIAGNOSIS — M2578 Osteophyte, vertebrae: Secondary | ICD-10-CM | POA: Diagnosis not present

## 2021-03-31 LAB — COMPREHENSIVE METABOLIC PANEL
ALT: 75 U/L — ABNORMAL HIGH (ref 0–44)
AST: 29 U/L (ref 15–41)
Albumin: 2.9 g/dL — ABNORMAL LOW (ref 3.5–5.0)
Alkaline Phosphatase: 73 U/L (ref 38–126)
Anion gap: 8 (ref 5–15)
BUN: 20 mg/dL (ref 8–23)
CO2: 27 mmol/L (ref 22–32)
Calcium: 9 mg/dL (ref 8.9–10.3)
Chloride: 107 mmol/L (ref 98–111)
Creatinine, Ser: 0.59 mg/dL (ref 0.44–1.00)
GFR, Estimated: >60 mL/min (ref >60)
Glucose, Bld: 148 mg/dL — ABNORMAL HIGH (ref 70–99)
Potassium: 3.8 mmol/L (ref 3.5–5.1)
Sodium: 142 mmol/L (ref 135–145)
Total Bilirubin: 1 mg/dL (ref 0.3–1.2)
Total Protein: 5.5 g/dL — ABNORMAL LOW (ref 6.5–8.1)

## 2021-03-31 LAB — URINALYSIS, MICROSCOPIC (REFLEX)

## 2021-03-31 LAB — RESP PANEL BY RT-PCR (FLU A&B, COVID) ARPGX2
Influenza A by PCR: NEGATIVE
Influenza B by PCR: NEGATIVE
SARS Coronavirus 2 by RT PCR: NEGATIVE

## 2021-03-31 LAB — URINALYSIS, ROUTINE W REFLEX MICROSCOPIC
Bilirubin Urine: NEGATIVE
Glucose, UA: 100 mg/dL — AB
Hgb urine dipstick: NEGATIVE
Ketones, ur: NEGATIVE mg/dL
Nitrite: NEGATIVE
Protein, ur: NEGATIVE mg/dL
Specific Gravity, Urine: 1.02 (ref 1.005–1.030)
pH: 6 (ref 5.0–8.0)

## 2021-03-31 LAB — CBC
HCT: 45.7 % (ref 36.0–46.0)
Hemoglobin: 14.7 g/dL (ref 12.0–15.0)
MCH: 30.9 pg (ref 26.0–34.0)
MCHC: 32.2 g/dL (ref 30.0–36.0)
MCV: 96 fL (ref 80.0–100.0)
Platelets: 106 10*3/uL — ABNORMAL LOW (ref 150–400)
RBC: 4.76 MIL/uL (ref 3.87–5.11)
RDW: 14.1 % (ref 11.5–15.5)
WBC: 9.1 10*3/uL (ref 4.0–10.5)
nRBC: 0 % (ref 0.0–0.2)

## 2021-03-31 LAB — PROTIME-INR
INR: 0.9 (ref 0.8–1.2)
Prothrombin Time: 12.6 seconds (ref 11.4–15.2)

## 2021-03-31 LAB — CK: Total CK: 63 U/L (ref 38–234)

## 2021-03-31 LAB — TROPONIN I (HIGH SENSITIVITY): Troponin I (High Sensitivity): 17 ng/L (ref <18)

## 2021-03-31 MED ORDER — LACTATED RINGERS IV SOLN: INTRAVENOUS | Status: AC

## 2021-03-31 MED ORDER — LACTATED RINGERS IV BOLUS
500.0000 mL | Freq: Once | INTRAVENOUS | Status: AC
  Administered 2021-03-31: 500 mL via INTRAVENOUS

## 2021-03-31 MED ORDER — LACTATED RINGERS IV SOLN: INTRAVENOUS | Status: DC

## 2021-03-31 MED ORDER — LACTATED RINGERS IV BOLUS
500.0000 mL | Freq: Once | INTRAVENOUS | Status: AC
  Administered 2021-03-31: 16:00:00 500 mL via INTRAVENOUS

## 2021-03-31 NOTE — ED Notes (Signed)
Attempted to draw blood from PIV placed by IV team without success. Lab called to collect blood.

## 2021-03-31 NOTE — ED Notes (Signed)
PIV attempted X 2 without success. MD aware.

## 2021-03-31 NOTE — ED Triage Notes (Signed)
Pt BIB [facility] from home s/p mechanical fall last night where she was up to BR and walker rolled out from under her and she landed on the floor. Pt reports she had been on the floor since 0300 this AM. Denies pain anywhere. A&O X 4. Hx brain cancer, came to get checked out :just in case."

## 2021-03-31 NOTE — ED Notes (Signed)
Pt transported to CT via stretcher at this time.  

## 2021-03-31 NOTE — ED Provider Notes (Signed)
[facility] EMERGENCY DEPARTMENT Provider Note   CSN: [id number] Arrival date & time: 03/31/21  1223     History Chief Complaint  Patient presents with   Lytle Michaels    Katelyn Kennedy is a 75 y.o. female.  HPI Patient presents after a fall at home.  Fall occurred at 3 AM.  At the time, she was going to the bathroom.  When she ambulates at home, she uses a rolling walker.  The rolling walker rolled out in front of her causing her to fall.  She also has had ongoing left-sided weakness following radiation for brain tumors, that she feels likely contributed to the fall.  When she fell, she believes that she struck the right side of her head against a wicker basket.  She denies LOC.  When she was on the floor, she felt too weak to stand back up.  She crawled out of the bathroom and onto her bedroom floor.  She pulled clothing underneath her to give her padding.  She remained on the floor for the remainder of the morning.  Her daughter attempted to call the patient, who was not able to access her phone.  A neighbor was called to check on her.  Patient remained on the floor until EMS arrived and got her up off of the floor.  Currently, she denies any areas of discomfort.  Prior to this episode, she was in her normal state of health.  She has completed the radiation therapy for her cancer several weeks ago.  She is currently on a steroid taper.  She was not previously on chemotherapy.    Past Medical History:  Diagnosis Date   Allergic rhinitis    Arthritis    Diabetes mellitus without complication (HCC)    diet control/no meds per pt   Dyspnea    Essential hypertension 04/11/2019   History of chicken pox    History of hiatal hernia    lung ca dx'd 04/2017   Peripheral vascular disease (HCC)    Pneumonia    PONV (postoperative nausea and vomiting)    Smoker     Patient Active Problem List   Diagnosis Date Noted   Acute encephalopathy 03/12/2021   Hypertensive urgency     Pressure injury of skin 03/07/2021   Intraparenchymal hemorrhage of brain ([place]) 03/05/2021   Vasogenic edema ([place]) 03/05/2021   Midline shift of brain 03/05/2021   Chronic anticoagulation 03/05/2021   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   History of lung cancer 10/15/2020   Atherosclerosis of aorta ([place]) 10/15/2020   Pain in lateral left lower extremity 04/30/2020   Multiple thyroid nodules 04/03/2019   History of bladder cancer 09/30/2018   Encounter for antineoplastic immunotherapy 09/13/2017   Chronic insomnia 08/17/2017   Encounter for antineoplastic chemotherapy 06/17/2017   Goals of care, counseling/discussion 06/17/2017   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 ([place]) 04/29/2017   Counseling regarding end of life decision making 07/31/2014   Eczema 07/20/2011   MICROALBUMINURIA 03/25/2010   Osteoporosis 10/08/2009   Diet-controlled type 2 diabetes mellitus without any complication 33/82/5053   Hyperlipidemia associated with type 2 diabetes mellitus ([place]) 09/03/2009   Allergic rhinitis 09/03/2009   ARTHRITIS 09/03/2009   CHICKENPOX, HX OF 09/03/2009    Past Surgical History:  Procedure Laterality Date   ANKLE FRACTURE SURGERY Left 10 years ago   "hard to wake up from anesthesia" per pt   APPLICATION OF CRANIAL NAVIGATION Right 11/07/2020  Procedure: APPLICATION OF CRANIAL NAVIGATION;  Surgeon: Earnie Larsson, MD;  Location: [place];  Service: Neurosurgery;  Laterality: Right;   [place]   BREAST BIOPSY Right 1978   BENIGN CYST   BREAST EXCISIONAL BIOPSY Left 2011   Benign   BREAST EXCISIONAL BIOPSY Right 1985   Benign    CATARACT EXTRACTION     CATARACT EXTRACTION W/ INTRAOCULAR LENS IMPLANT Bilateral    COLONOSCOPY  2011   CRANIOTOMY Right 11/07/2020   Procedure: Right Sided Craniotomy for Tumor;  Surgeon: Earnie Larsson, MD;  Location: [facility];  Service: Neurosurgery;  Laterality: Right;   ELECTROMAGNETIC NAVIGATION BROCHOSCOPY  N/A 06/08/2017   Procedure: ELECTROMAGNETIC NAVIGATION BRONCHOSCOPY;  Surgeon: Flora Lipps, MD;  Location: [facility];  Service: Cardiopulmonary;  Laterality: N/A;   FRACTURE SURGERY     POLYPECTOMY  2011   TRANSURETHRAL RESECTION OF BLADDER TUMOR WITH MITOMYCIN-C N/A 05/04/2017   Procedure: TRANSURETHRAL RESECTION OF BLADDER TUMOR WITH gemcitabine;  Surgeon: Abbie Sons, MD;  Location: [facility];  Service: Urology;  Laterality: N/A;   TUBAL LIGATION       OB History   No obstetric history on file.     Family History  Problem Relation Age of Onset   Diabetes Mother    Hypothyroidism Mother    Cancer Sister 23       BREAST   Breast cancer Sister    Hypothyroidism Sister    Cancer Maternal Grandmother        COLON   Colon cancer Maternal Grandmother 86   Stomach cancer Neg Hx     Social History   Tobacco Use   Smoking status: Former    Packs/day: 0.50    Years: 40.00    Pack years: 20.00    Types: Cigarettes    Quit date: 04/29/2017    Years since quitting: 3.9   Smokeless tobacco: Never  Vaping Use   Vaping Use: Never used  Substance Use Topics   Alcohol use: Not Currently    Comment: occassionally   Drug use: No    Home Medications Prior to Admission medications   Medication Sig Start Date End Date Taking? Authorizing Provider  amitriptyline (ELAVIL) 50 MG tablet Take 1 tablet (50 mg total) by mouth at bedtime. 10/15/20  Yes Bedsole, Amy E, MD  atorvastatin (LIPITOR) 40 MG tablet 1 tablet  po daily Patient taking differently: Take 40 mg by mouth daily. 10/15/20  Yes Bedsole, Amy E, MD  Calcium Citrate-Vitamin D (CITRACAL + D PO) Take 1 tablet by mouth daily.   Yes [provider]  cholecalciferol (VITAMIN D) 25 MCG (1000 UNIT) tablet Take 1,000 Units by mouth daily.   Yes [provider]  dexamethasone (DECADRON) 2 MG tablet 4 mg 3 times a day for 5 days, then 4 mg twice a day for 5 days, then 4 mg daily for 5 days, then 2 mg daily for 5 days,  then 2 mg every other day for 6 days then stop 03/19/21  Yes Lavina Hamman, MD  famotidine (PEPCID) 20 MG tablet Take 1 tablet (20 mg total) by mouth daily. 03/19/21  Yes Lavina Hamman, MD  Multiple Vitamin (MULTIVITAMIN) capsule Take 1 capsule by mouth daily. Centrum Silver   Yes [provider]  polyethylene glycol (MIRALAX / GLYCOLAX) 17 g packet Take 17 g by mouth daily as needed for moderate constipation. 03/18/21  Yes Lavina Hamman, MD  vitamin B-12 (CYANOCOBALAMIN) 1000 MCG tablet  Take 1,000 mcg by mouth daily.   Yes [provider]    Allergies    Codeine  Review of Systems   Review of Systems  Constitutional:  Negative for activity change, appetite change, chills, fatigue and fever.  HENT:  Negative for congestion, ear pain and sore throat.   Eyes:  Negative for pain and visual disturbance.  Respiratory:  Negative for cough, chest tightness and shortness of breath.   Cardiovascular:  Negative for chest pain, palpitations and leg swelling.  Gastrointestinal:  Negative for abdominal pain, diarrhea, nausea and vomiting.  Genitourinary:  Negative for dysuria, flank pain, hematuria and pelvic pain.  Musculoskeletal:  Negative for arthralgias, back pain, myalgias, neck pain and neck stiffness.  Skin:  Negative for color change, rash and wound.  Neurological:  Positive for weakness (Left-sided, chronic). Negative for dizziness, tremors, seizures, syncope, facial asymmetry, speech difficulty, light-headedness, numbness and headaches.  Hematological:  Does not bruise/bleed easily.  Psychiatric/Behavioral:  Negative for confusion and decreased concentration.   All other systems reviewed and are negative.  Physical Exam Updated Vital Signs BP 122/77    Pulse (!) 107    Temp 97.9 F (36.6 C) (Oral)    Resp 20    Ht 4\' 11"  (1.499 m)    Wt 62 kg    SpO2 99%    BMI 27.61 kg/m   Physical Exam Vitals and nursing note reviewed.  Constitutional:      General: She is not  in acute distress.    Appearance: Normal appearance. She is well-developed and normal weight. She is not ill-appearing, toxic-appearing or diaphoretic.  HENT:     Head: Normocephalic.     Comments: Small hematoma to right parietal scalp    Right Ear: External ear normal.     Left Ear: External ear normal.     Mouth/Throat:     Mouth: Mucous membranes are moist.     Pharynx: Oropharynx is clear.  Eyes:     General: No scleral icterus.    Extraocular Movements: Extraocular movements intact.     Conjunctiva/sclera: Conjunctivae normal.  Cardiovascular:     Rate and Rhythm: Normal rate and regular rhythm.     Heart sounds: No murmur heard. Pulmonary:     Effort: Pulmonary effort is normal. No respiratory distress.     Breath sounds: Normal breath sounds. No wheezing or rales.  Chest:     Chest wall: No tenderness.  Abdominal:     General: Abdomen is flat.     Palpations: Abdomen is soft.     Tenderness: There is no abdominal tenderness.  Musculoskeletal:        General: No swelling, tenderness, deformity or signs of injury.     Cervical back: Normal range of motion and neck supple. No rigidity.     Right lower leg: No edema.     Left lower leg: No edema.  Skin:    General: Skin is warm and dry.     Capillary Refill: Capillary refill takes less than 2 seconds.     Coloration: Skin is not jaundiced or pale.     Findings: No bruising.  Neurological:     General: No focal deficit present.     Mental Status: She is alert and oriented to person, place, and time.     Cranial Nerves: No cranial nerve deficit.     Sensory: No sensory deficit.     Motor: Weakness (Slightly diminished strength in LLE, compared to RLE) present.  Coordination: Coordination normal.  Psychiatric:        Mood and Affect: Mood normal.        Behavior: Behavior normal.        Thought Content: Thought content normal.        Judgment: Judgment normal.    ED Results / Procedures / Treatments   Labs (all  labs ordered are listed, but only abnormal results are displayed) Labs Reviewed  RESP PANEL BY RT-PCR (FLU A&B, COVID) [id number]  COMPREHENSIVE METABOLIC PANEL  CBC  URINALYSIS, ROUTINE W REFLEX MICROSCOPIC  PROTIME-INR  CK  TROPONIN I (HIGH SENSITIVITY)    EKG EKG Interpretation  Date/Time:  Monday March 31 2021 14:01:05 EST Ventricular Rate:  102 PR Interval:  102 QRS Duration: 98 QT Interval:  336 QTC Calculation: 438 R Axis:   74 Text Interpretation: Sinus tachycardia Abnormal inferior Q waves Confirmed by Godfrey Pick [phone]) on 03/31/2021 2:03:28 PM  Radiology CT HEAD WO CONTRAST  Result Date: 03/31/2021 CLINICAL DATA:  Provided history: Head trauma, minor. Additional history provided: Mechanical fall last night. EXAM: CT HEAD WITHOUT CONTRAST TECHNIQUE: Contiguous axial images were obtained from the base of the skull through the vertex without intravenous contrast. COMPARISON:  Brain MRI 03/06/2021.  Head CT 03/05/2021. FINDINGS: Brain: Cerebral volume is normal. Redemonstrated sequela of prior right parietal craniotomy and postsurgical changes within the underlying posterior right frontal lobe. The patient has multiple known hemorrhagic parenchymal metastases within the bilateral frontal lobes, as well as fairly extensive hemorrhagic dural-based metastatic disease along the falx and overlying the right cerebral hemisphere. Multifocal hemorrhage associated with the metastatic lesions has decreased or remained stable since the prior head CT of 03/05/2021. Similarly, sites of vasogenic edema in the right frontal lobe have remained stable or decreased as compared to the prior examination. Most notably, there is a persistent focus of moderate vasogenic edema within the anterior right frontal lobe and callosal genu. Small foci of edema within the right parietooccipital lobes have not significantly changed. Within the limitations of a noncontrast head CT, no new sites of intracranial  metastatic disease are identified. Mass effect has decreased. There is now 3 mm leftward midline shift measured at the level of the septum pellucidum (previously 8 mm). No demarcated cortical infarct. Vascular: Maintained flow voids within the proximal large arterial vessels. Atherosclerotic calcifications. Skull: Right parietal cranioplasty. No calvarial fracture. No focal suspicious osseous lesion. Sinuses/Orbits: Visualized orbits show no acute finding. No significant paranasal sinus disease. IMPRESSION: Known multifocal intracranial metastatic disease with both parenchymal and dural-based lesions. Hemorrhage associated with the intracranial metastases has remained stable or decreased when compared with the prior head CT of 03/05/2021. Similarly, multifocal vasogenic edema associated with these lesions has decreased or remained stable. Most notably, there is a focus of moderate residual vasogenic edema within the anterior right frontal lobe. Mass effect has decreased, now with 3 mm leftward midline shift (previously 8 mm). Within the limitations of a noncontrast head CT, no new intracranial metastasis is identified. Redemonstrated sequela of prior right parietal calvarium and postsurgical changes in the underlying posterior right frontal lobe. Electronically Signed   By: Kellie Simmering D.O.   On: 03/31/2021 15:15   CT CERVICAL SPINE WO CONTRAST  Result Date: 03/31/2021 CLINICAL DATA:  Neck trauma (Age >= 65y) EXAM: CT CERVICAL SPINE WITHOUT CONTRAST TECHNIQUE: Multidetector CT imaging of the cervical spine was performed without intravenous contrast. Multiplanar CT image reconstructions were also generated. COMPARISON:  None. FINDINGS: Alignment: Mild reversal of the  normal cervical lordosis. Slight anterolisthesis of C3 on C4, favor degenerative given facet arthropathy at this level. Skull base and vertebrae: Vertebral body heights are maintained. No evidence of acute fracture. Bony demineralization Soft  tissues and spinal canal: No prevertebral fluid or swelling. No visible canal hematoma. Disc levels: Moderate degenerative disease at C5-C6 and C6-C7 where there is disc height loss, endplate sclerosis and posterior disc osteophyte complex. Multilevel facet arthropathy. Upper chest: Similar size of an 8 mm solid/subsolid pulmonary nodule in the right upper lobe, better characterized on prior CT chest from 11/11/2020. Other: Bilateral thyroid nodules, better characterized on ultrasound of the thyroid from 04/26/2019 (ref: J Am Coll Radiol. 2015 Feb;12(2): 143-50). IMPRESSION: 1. No evidence of acute fracture or traumatic malalignment. 2. Multilevel degenerative change, detailed above. 3. Similar size of an 8 mm solid/subsolid pulmonary nodule in the right upper lobe, better characterized on prior CT chest. Recommend continued attention on follow-up imaging. Electronically Signed   By: Margaretha Sheffield M.D.   On: 03/31/2021 15:00   DG Pelvis Portable  Result Date: 03/31/2021 CLINICAL DATA:  [place] today EXAM: PORTABLE PELVIS 1-2 VIEWS COMPARISON:  None. FINDINGS: No evidence of regional fracture. There is osteoarthritis of the right hip joint. There is lower lumbar degenerative change. IMPRESSION: Normal except for chronic osteoarthritis of the hip joint and lower lumbar degenerative change. No acute or traumatic finding. Electronically Signed   By: Nelson Chimes M.D.   On: 03/31/2021 13:17   DG Chest Port 1 View  Result Date: 03/31/2021 CLINICAL DATA:  Fall EXAM: PORTABLE CHEST 1 VIEW COMPARISON:  Chest radiographs, 01/19/2012, CT chest, 8,002 FINDINGS: The heart size and mediastinal contours are within normal limits. Bandlike scarring and fibrosis of the perihilar right lung. The visualized skeletal structures are unremarkable. IMPRESSION: No acute abnormality of the lungs. Bandlike scarring and fibrosis of the perihilar right lung, unchanged compared to prior CT. Electronically Signed   By: Delanna Ahmadi  M.D.   On: 03/31/2021 13:17    Procedures Procedures   Medications Ordered in ED Medications  lactated ringers bolus 500 mL (500 mLs Intravenous New Bag/Given 03/31/21 1612)    ED Course  I have reviewed the triage vital signs and the nursing notes.  Pertinent labs & imaging results that were available during my care of the patient were reviewed by me and considered in my medical decision making (see chart for details).    MDM Rules/Calculators/A&P                         Patient presents following a ground-level fall that occurred at 3 AM and subsequent time on floor until 10 AM, when EMS arrived.  She currently denies any pain.  She does have chronic left-sided weakness which she says is at baseline.  Patient undergo laboratory and imaging work-up.  Care of patient was signed out to oncoming ED provider.   Final Clinical Impression(s) / ED Diagnoses Final diagnoses:  Fall    Rx / DC Orders ED Discharge Orders     None        Godfrey Pick, MD 03/31/21 1624

## 2021-03-31 NOTE — ED Notes (Signed)
Portable Xray at bedside.

## 2021-03-31 NOTE — H&P (Addendum)
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  Of note, most recently patient was hospitalized at [facility] long hospital from 11/23 until 12/7 due to worsening left-sided weakness found to have progression of metastatic disease to the brain with new hemorrhagic lesions with associated vasogenic edema and midline shift.  Patient was hospitalized with neurosurgery consultation.  Patient was treated with 1 week of prophylactic Keppra as well as initiation of steroid therapy.  Radiation oncology was also consulted and patient was initiated whole brain radiation therapy with completion on date of discharge which was 12/7.  Patient explains that early this morning at approximately 3 AM she was attempting to walk to the bathroom using her walker which she states rolled out from under her resulting in a fall.  Patient denies any associated loss of consciousness or new focal weakness.  Due to patient's weakness she was unable to get up for the remainder of the morning.  Patient denies any associated slurred speech or visual changes.  Patient reports striking her head on a basket that was beside her when she fell.  Patient denies any additional symptoms such as fevers, sick contacts, cough, shortness of breath abdominal pain or dysuria.  Patient's neighbor came by to check on her this morning and found her down contacting EMS who promptly came to evaluate the patient  bring her into [facility] emergency department for evaluation.  Upon evaluation in the emergency department CT imaging of the head redemonstrated multifocal intracranial metastatic disease revealing stable or decreased hemorrhages with decreasing vasogenic edema and reduced leftward midline shift of 3 mm.  Urinalysis revealed no evidence of infection.  Chest x-ray revealed no evidence of cardiopulmonary disease.  Patient was administered a 500 cc LR bolus.  Due to concerns for patient's safety and ongoing weakness the hospitalist group was then called to assess the patient for admission to the hospital.  Review of Systems:   Review of Systems  Musculoskeletal:  Positive for falls.  Neurological:  Positive for focal weakness.  All other systems reviewed and are negative.  Past Medical History:  Diagnosis Date   Allergic rhinitis    Arthritis    Diabetes mellitus without complication (HCC)    diet control/no meds per pt   Dyspnea    Essential hypertension 04/11/2019   History of chicken pox    History of hiatal hernia    lung ca dx'd 04/2017   Peripheral vascular disease (HCC)    Pneumonia    PONV (postoperative nausea and vomiting)    Smoker     Past Surgical History:  Procedure Laterality Date   ANKLE FRACTURE SURGERY Left 10 years ago   "hard to wake up from anesthesia" per pt   APPLICATION OF CRANIAL NAVIGATION Right 11/07/2020   Procedure: APPLICATION OF CRANIAL NAVIGATION;  Surgeon: Earnie Larsson, MD;  Location: [place];  Service: Neurosurgery;  Laterality: Right;   [place]   BREAST BIOPSY Right 1978  BENIGN CYST   BREAST EXCISIONAL BIOPSY Left 2011   Benign   BREAST EXCISIONAL BIOPSY Right 1985   Benign    CATARACT EXTRACTION     CATARACT EXTRACTION W/ INTRAOCULAR LENS IMPLANT Bilateral    COLONOSCOPY  2011   CRANIOTOMY Right 11/07/2020   Procedure: Right Sided Craniotomy for Tumor;  Surgeon: Earnie Larsson, MD;  Location: [place];  Service:  Neurosurgery;  Laterality: Right;   ELECTROMAGNETIC NAVIGATION BROCHOSCOPY N/A 06/08/2017   Procedure: ELECTROMAGNETIC NAVIGATION BRONCHOSCOPY;  Surgeon: Flora Lipps, MD;  Location: [facility];  Service: Cardiopulmonary;  Laterality: N/A;   FRACTURE SURGERY     POLYPECTOMY  2011   TRANSURETHRAL RESECTION OF BLADDER TUMOR WITH MITOMYCIN-C N/A 05/04/2017   Procedure: TRANSURETHRAL RESECTION OF BLADDER TUMOR WITH gemcitabine;  Surgeon: Abbie Sons, MD;  Location: [facility];  Service: Urology;  Laterality: N/A;   TUBAL LIGATION       reports that she quit smoking about 3 years ago. Her smoking use included cigarettes. She has a 20.00 pack-year smoking history. She has never used smokeless tobacco. She reports that she does not currently use alcohol. She reports that she does not use drugs.  Allergies  Allergen Reactions   Codeine Nausea Only    Family History  Problem Relation Age of Onset   Diabetes Mother    Hypothyroidism Mother    Cancer Sister 78       BREAST   Breast cancer Sister    Hypothyroidism Sister    Cancer Maternal Grandmother        COLON   Colon cancer Maternal Grandmother 86   Stomach cancer Neg Hx      Prior to Admission medications   Medication Sig Start Date End Date Taking? Authorizing Provider  amitriptyline (ELAVIL) 50 MG tablet Take 1 tablet (50 mg total) by mouth at bedtime. 10/15/20  Yes Bedsole, Amy E, MD  atorvastatin (LIPITOR) 40 MG tablet 1 tablet  po daily Patient taking differently: Take 40 mg by mouth daily. 10/15/20  Yes Bedsole, Amy E, MD  Calcium Citrate-Vitamin D (CITRACAL + D PO) Take 1 tablet by mouth daily.   Yes [provider]  cholecalciferol (VITAMIN D) 25 MCG (1000 UNIT) tablet Take 1,000 Units by mouth daily.   Yes [provider]  dexamethasone (DECADRON) 2 MG tablet 4 mg 3 times a day for 5 days, then 4 mg twice a day for 5 days, then 4 mg daily for 5 days, then 2 mg daily for 5 days, then 2 mg every other day for 6  days then stop 03/19/21  Yes Lavina Hamman, MD  famotidine (PEPCID) 20 MG tablet Take 1 tablet (20 mg total) by mouth daily. 03/19/21  Yes Lavina Hamman, MD  Multiple Vitamin (MULTIVITAMIN) capsule Take 1 capsule by mouth daily. Centrum Silver   Yes [provider]  polyethylene glycol (MIRALAX / GLYCOLAX) 17 g packet Take 17 g by mouth daily as needed for moderate constipation. 03/18/21  Yes Lavina Hamman, MD  vitamin B-12 (CYANOCOBALAMIN) 1000 MCG tablet Take 1,000 mcg by mouth daily.   Yes [provider]    Physical Exam: Vitals:   03/31/21 2245 03/31/21 2331 04/01/21 0030 04/01/21 0100  BP: (!) 114/59  111/64 116/73  Pulse: (!) 106 (!) 110 (!) 103 (!) 102  Resp: (!) 21 18 14 12   Temp:  98.5 F (36.9 C)    TempSrc:  Oral    SpO2: 98% 98% 97% 96%  Weight:      Height:        Constitutional: Awake alert and oriented x3, no associated distress.   Skin: no rashes, no lesions, good skin turgor noted. Eyes: Pupils are equally reactive to light.  No evidence of scleral icterus or conjunctival pallor.  ENMT: Moist mucous membranes noted.  Posterior pharynx clear of any exudate or lesions.   Neck: normal, supple, no masses, no thyromegaly.  No evidence of jugular venous distension.   Respiratory: clear to auscultation bilaterally, no wheezing, no crackles. Normal respiratory effort. No accessory muscle use.  Cardiovascular: tachycardic rate with regular rhythm, no murmurs / rubs / gallops. No extremity edema. 2+ pedal pulses. No carotid bruits.  Chest:   Nontender without crepitus or deformity.   Back:   Nontender without crepitus or deformity. Abdomen: Abdomen is soft and nontender.  No evidence of intra-abdominal masses.  Positive bowel sounds noted in all quadrants.   Musculoskeletal: No joint deformity upper and lower extremities. Good ROM, no contractures. Normal muscle tone.  Neurologic: CN 2-12 grossly intact. Sensation intact.  Patient moving all 4 extremities  spontaneously.  Patient is following all commands.  Patient is responsive to verbal stimuli.   Psychiatric: Patient exhibits normal mood with appropriate affect.  Patient seems to possess insight as to their current situation.     Labs on Admission: I have personally reviewed following labs and imaging studies -   CBC: Recent Labs  Lab 03/31/21 1354  WBC 9.1  HGB 14.7  HCT 45.7  MCV 96.0  PLT 349*   Basic Metabolic Panel: Recent Labs  Lab 03/31/21 1354  NA 142  K 3.8  CL 107  CO2 27  GLUCOSE 148*  BUN 20  CREATININE 0.59  CALCIUM 9.0   GFR: Estimated Creatinine Clearance: 47.1 mL/min (by C-G formula based on SCr of 0.59 mg/dL). Liver Function Tests: Recent Labs  Lab 03/31/21 1354  AST 29  ALT 75*  ALKPHOS 73  BILITOT 1.0  PROT 5.5*  ALBUMIN 2.9*   No results for input(s): LIPASE, AMYLASE in the last 168 hours. No results for input(s): AMMONIA in the last 168 hours. Coagulation Profile: Recent Labs  Lab 03/31/21 1354  INR 0.9   Cardiac Enzymes: Recent Labs  Lab 03/31/21 1354  CKTOTAL 63   BNP (last 3 results) No results for input(s): PROBNP in the last 8760 hours. HbA1C: No results for input(s): HGBA1C in the last 72 hours. CBG: No results for input(s): GLUCAP in the last 168 hours. Lipid Profile: No results for input(s): CHOL, HDL, LDLCALC, TRIG, CHOLHDL, LDLDIRECT in the last 72 hours. Thyroid Function Tests: No results for input(s): TSH, T4TOTAL, FREET4, T3FREE, THYROIDAB in the last 72 hours. Anemia Panel: No results for input(s): VITAMINB12, FOLATE, FERRITIN, TIBC, IRON, RETICCTPCT in the last 72 hours. Urine analysis:    Component Value Date/Time   COLORURINE YELLOW 03/31/2021 2052   APPEARANCEUR CLEAR 03/31/2021 2052   APPEARANCEUR Clear 08/15/2020 1508   LABSPEC 1.020 03/31/2021 2052   PHURINE 6.0 03/31/2021 2052   GLUCOSEU 100 (A) 03/31/2021 2052   HGBUR NEGATIVE 03/31/2021 2052   BILIRUBINUR NEGATIVE 03/31/2021 2052   BILIRUBINUR  Negative 08/15/2020 [place] 03/31/2021 2052   PROTEINUR NEGATIVE 03/31/2021 2052   UROBILINOGEN 0.2 09/20/2018 1556   NITRITE NEGATIVE 03/31/2021 2052   LEUKOCYTESUR SMALL (A) 03/31/2021 2052    Radiological Exams on Admission - Personally Reviewed: CT HEAD WO CONTRAST  Result Date: 03/31/2021 CLINICAL DATA:  Provided  history: Head trauma, minor. Additional history provided: Mechanical fall last night. EXAM: CT HEAD WITHOUT CONTRAST TECHNIQUE: Contiguous axial images were obtained from the base of the skull through the vertex without intravenous contrast. COMPARISON:  Brain MRI 03/06/2021.  Head CT 03/05/2021. FINDINGS: Brain: Cerebral volume is normal. Redemonstrated sequela of prior right parietal craniotomy and postsurgical changes within the underlying posterior right frontal lobe. The patient has multiple known hemorrhagic parenchymal metastases within the bilateral frontal lobes, as well as fairly extensive hemorrhagic dural-based metastatic disease along the falx and overlying the right cerebral hemisphere. Multifocal hemorrhage associated with the metastatic lesions has decreased or remained stable since the prior head CT of 03/05/2021. Similarly, sites of vasogenic edema in the right frontal lobe have remained stable or decreased as compared to the prior examination. Most notably, there is a persistent focus of moderate vasogenic edema within the anterior right frontal lobe and callosal genu. Small foci of edema within the right parietooccipital lobes have not significantly changed. Within the limitations of a noncontrast head CT, no new sites of intracranial metastatic disease are identified. Mass effect has decreased. There is now 3 mm leftward midline shift measured at the level of the septum pellucidum (previously 8 mm). No demarcated cortical infarct. Vascular: Maintained flow voids within the proximal large arterial vessels. Atherosclerotic calcifications. Skull: Right  parietal cranioplasty. No calvarial fracture. No focal suspicious osseous lesion. Sinuses/Orbits: Visualized orbits show no acute finding. No significant paranasal sinus disease. IMPRESSION: Known multifocal intracranial metastatic disease with both parenchymal and dural-based lesions. Hemorrhage associated with the intracranial metastases has remained stable or decreased when compared with the prior head CT of 03/05/2021. Similarly, multifocal vasogenic edema associated with these lesions has decreased or remained stable. Most notably, there is a focus of moderate residual vasogenic edema within the anterior right frontal lobe. Mass effect has decreased, now with 3 mm leftward midline shift (previously 8 mm). Within the limitations of a noncontrast head CT, no new intracranial metastasis is identified. Redemonstrated sequela of prior right parietal calvarium and postsurgical changes in the underlying posterior right frontal lobe. Electronically Signed   By: Kellie Simmering D.O.   On: 03/31/2021 15:15   CT CERVICAL SPINE WO CONTRAST  Result Date: 03/31/2021 CLINICAL DATA:  Neck trauma (Age >= 65y) EXAM: CT CERVICAL SPINE WITHOUT CONTRAST TECHNIQUE: Multidetector CT imaging of the cervical spine was performed without intravenous contrast. Multiplanar CT image reconstructions were also generated. COMPARISON:  None. FINDINGS: Alignment: Mild reversal of the normal cervical lordosis. Slight anterolisthesis of C3 on C4, favor degenerative given facet arthropathy at this level. Skull base and vertebrae: Vertebral body heights are maintained. No evidence of acute fracture. Bony demineralization Soft tissues and spinal canal: No prevertebral fluid or swelling. No visible canal hematoma. Disc levels: Moderate degenerative disease at C5-C6 and C6-C7 where there is disc height loss, endplate sclerosis and posterior disc osteophyte complex. Multilevel facet arthropathy. Upper chest: Similar size of an 8 mm solid/subsolid  pulmonary nodule in the right upper lobe, better characterized on prior CT chest from 11/11/2020. Other: Bilateral thyroid nodules, better characterized on ultrasound of the thyroid from 04/26/2019 (ref: J Am Coll Radiol. 2015 Feb;12(2): 143-50). IMPRESSION: 1. No evidence of acute fracture or traumatic malalignment. 2. Multilevel degenerative change, detailed above. 3. Similar size of an 8 mm solid/subsolid pulmonary nodule in the right upper lobe, better characterized on prior CT chest. Recommend continued attention on follow-up imaging. Electronically Signed   By: Margaretha Sheffield M.D.   On: 03/31/2021  15:00   DG Pelvis Portable  Result Date: 03/31/2021 CLINICAL DATA:  [place] today EXAM: PORTABLE PELVIS 1-2 VIEWS COMPARISON:  None. FINDINGS: No evidence of regional fracture. There is osteoarthritis of the right hip joint. There is lower lumbar degenerative change. IMPRESSION: Normal except for chronic osteoarthritis of the hip joint and lower lumbar degenerative change. No acute or traumatic finding. Electronically Signed   By: Nelson Chimes M.D.   On: 03/31/2021 13:17   DG Chest Port 1 View  Result Date: 03/31/2021 CLINICAL DATA:  Fall EXAM: PORTABLE CHEST 1 VIEW COMPARISON:  Chest radiographs, 01/19/2012, CT chest, 8,002 FINDINGS: The heart size and mediastinal contours are within normal limits. Bandlike scarring and fibrosis of the perihilar right lung. The visualized skeletal structures are unremarkable. IMPRESSION: No acute abnormality of the lungs. Bandlike scarring and fibrosis of the perihilar right lung, unchanged compared to prior CT. Electronically Signed   By: Delanna Ahmadi M.D.   On: 03/31/2021 13:17    EKG: Personally reviewed.  Rhythm is sinus tachycardia with heart rate of 102 bpm.  Evidence of Q waves in the inferior leads.  Otherwise, no dynamic ST segment changes appreciated.  Assessment/Plan  * Fall at home, initial encounter Patient reports mechanical fall and denies any  worsening or new neurologic deficit Orthostatic hypotension identified in the emergency department which may have contributed to patient's fall CT imaging of the brain without contrast reveals improving vasogenic edema and midline shift without new areas of disease Obtaining PT evaluation Patient may benefit from short course of subacute rehabilitation in a skilled nursing facility Patient is already undergone a qualifying 3 midnight stay in late November/early December Case management referral additionally placed  Orthostatic hypotension Identified orthostatic hypotension in the emergency department 129/53 lying to 97/57 sitting and 99/56 standing Presumed to be secondary to poor oral intake Providing patient with series of small boluses of LR followed by LR infusion Will reassess orthostatic vital signs in the morning  Vasogenic brain edema (HCC) Evidence of improving edema and hemorrhages on review of CT imaging of the head with Dr. Lorrin Goodell with neurology Per my discussion with neurology, no indication for repeat MRI imaging of the brain at this time unless patient exhibits worsening lethargy or new neurologic deficits Continuing tapering regimen of Decadron per neurosurgery recommendations during previous hospitalization Performing serial neurologic checks  Intraparenchymal hemorrhage of brain [facility]) See assessment and plan above  Type 2 diabetes mellitus without complication, without long-term current use of insulin ([place]) Patient been placed on Accu-Cheks before every meal and nightly with sliding scale insulin Hemoglobin A1C performed in November 4.9% Diabetic Diet   Non-small cell lung cancer metastatic to brain [facility]) Follows with Dr. Julien Nordmann with [facility] Continue outpatient follow-up  Hyperlipidemia associated with type 2 diabetes mellitus ([place]) Continuing home regimen of lipid lowering therapy.        Code Status:  DNR  code status decision has been  confirmed with: patient Family Communication: deferred   Status is: Observation  The patient remains OBS appropriate and will d/c before 2 midnights.       Vernelle Emerald MD [facility] Pager [phone]  If 7PM-7AM, please contact night-coverage [email or web address] Use universal [place] password for that web site. If you do not have the password, please call the hospital operator.  04/01/2021, 2:11 AM

## 2021-03-31 NOTE — ED Notes (Signed)
ED Provider at bedside. 

## 2021-04-01 ENCOUNTER — Encounter (HOSPITAL_COMMUNITY): Payer: Self-pay | Admitting: Internal Medicine

## 2021-04-01 ENCOUNTER — Other Ambulatory Visit: Payer: Self-pay

## 2021-04-01 DIAGNOSIS — Z79899 Other long term (current) drug therapy: Secondary | ICD-10-CM | POA: Diagnosis not present

## 2021-04-01 DIAGNOSIS — G8194 Hemiplegia, unspecified affecting left nondominant side: Secondary | ICD-10-CM | POA: Diagnosis present

## 2021-04-01 DIAGNOSIS — I700 Atherosclerosis of aorta: Secondary | ICD-10-CM | POA: Diagnosis present

## 2021-04-01 DIAGNOSIS — L89151 Pressure ulcer of sacral region, stage 1: Secondary | ICD-10-CM | POA: Diagnosis present

## 2021-04-01 DIAGNOSIS — Z8551 Personal history of malignant neoplasm of bladder: Secondary | ICD-10-CM | POA: Diagnosis not present

## 2021-04-01 DIAGNOSIS — E1169 Type 2 diabetes mellitus with other specified complication: Secondary | ICD-10-CM | POA: Diagnosis present

## 2021-04-01 DIAGNOSIS — Z85118 Personal history of other malignant neoplasm of bronchus and lung: Secondary | ICD-10-CM | POA: Diagnosis not present

## 2021-04-01 DIAGNOSIS — Z66 Do not resuscitate: Secondary | ICD-10-CM | POA: Diagnosis present

## 2021-04-01 DIAGNOSIS — Z87891 Personal history of nicotine dependence: Secondary | ICD-10-CM | POA: Diagnosis not present

## 2021-04-01 DIAGNOSIS — E785 Hyperlipidemia, unspecified: Secondary | ICD-10-CM | POA: Diagnosis present

## 2021-04-01 DIAGNOSIS — I951 Orthostatic hypotension: Secondary | ICD-10-CM | POA: Diagnosis present

## 2021-04-01 DIAGNOSIS — Y92009 Unspecified place in unspecified non-institutional (private) residence as the place of occurrence of the external cause: Secondary | ICD-10-CM | POA: Diagnosis not present

## 2021-04-01 DIAGNOSIS — G4709 Other insomnia: Secondary | ICD-10-CM | POA: Diagnosis present

## 2021-04-01 DIAGNOSIS — M810 Age-related osteoporosis without current pathological fracture: Secondary | ICD-10-CM | POA: Diagnosis present

## 2021-04-01 DIAGNOSIS — I619 Nontraumatic intracerebral hemorrhage, unspecified: Secondary | ICD-10-CM | POA: Diagnosis not present

## 2021-04-01 DIAGNOSIS — Z923 Personal history of irradiation: Secondary | ICD-10-CM | POA: Diagnosis not present

## 2021-04-01 DIAGNOSIS — W19XXXA Unspecified fall, initial encounter: Secondary | ICD-10-CM | POA: Diagnosis present

## 2021-04-01 DIAGNOSIS — E1151 Type 2 diabetes mellitus with diabetic peripheral angiopathy without gangrene: Secondary | ICD-10-CM | POA: Diagnosis present

## 2021-04-01 DIAGNOSIS — Z885 Allergy status to narcotic agent status: Secondary | ICD-10-CM | POA: Diagnosis not present

## 2021-04-01 DIAGNOSIS — E876 Hypokalemia: Secondary | ICD-10-CM | POA: Diagnosis present

## 2021-04-01 DIAGNOSIS — Z86718 Personal history of other venous thrombosis and embolism: Secondary | ICD-10-CM | POA: Diagnosis not present

## 2021-04-01 DIAGNOSIS — C7931 Secondary malignant neoplasm of brain: Secondary | ICD-10-CM | POA: Diagnosis present

## 2021-04-01 DIAGNOSIS — W1830XA Fall on same level, unspecified, initial encounter: Secondary | ICD-10-CM | POA: Diagnosis present

## 2021-04-01 DIAGNOSIS — I10 Essential (primary) hypertension: Secondary | ICD-10-CM | POA: Diagnosis present

## 2021-04-01 DIAGNOSIS — G936 Cerebral edema: Secondary | ICD-10-CM | POA: Diagnosis present

## 2021-04-01 DIAGNOSIS — Z20822 Contact with and (suspected) exposure to covid-19: Secondary | ICD-10-CM | POA: Diagnosis present

## 2021-04-01 LAB — CBC WITH DIFFERENTIAL/PLATELET
Abs Immature Granulocytes: 0.33 10*3/uL — ABNORMAL HIGH (ref 0.00–0.07)
Basophils Absolute: 0 10*3/uL (ref 0.0–0.1)
Basophils Relative: 1 %
Eosinophils Absolute: 0 10*3/uL (ref 0.0–0.5)
Eosinophils Relative: 1 %
HCT: 38.5 % (ref 36.0–46.0)
Hemoglobin: 12.2 g/dL (ref 12.0–15.0)
Immature Granulocytes: 6 %
Lymphocytes Relative: 11 %
Lymphs Abs: 0.6 10*3/uL — ABNORMAL LOW (ref 0.7–4.0)
MCH: 30.4 pg (ref 26.0–34.0)
MCHC: 31.7 g/dL (ref 30.0–36.0)
MCV: 96 fL (ref 80.0–100.0)
Monocytes Absolute: 0.2 10*3/uL (ref 0.1–1.0)
Monocytes Relative: 4 %
Neutro Abs: 4.5 10*3/uL (ref 1.7–7.7)
Neutrophils Relative %: 77 %
Platelets: 89 10*3/uL — ABNORMAL LOW (ref 150–400)
RBC: 4.01 MIL/uL (ref 3.87–5.11)
RDW: 14.4 % (ref 11.5–15.5)
WBC: 5.8 10*3/uL (ref 4.0–10.5)
nRBC: 0 % (ref 0.0–0.2)

## 2021-04-01 LAB — COMPREHENSIVE METABOLIC PANEL
ALT: 57 U/L — ABNORMAL HIGH (ref 0–44)
AST: 30 U/L (ref 15–41)
Albumin: 2.1 g/dL — ABNORMAL LOW (ref 3.5–5.0)
Alkaline Phosphatase: 65 U/L (ref 38–126)
Anion gap: 7 (ref 5–15)
BUN: 19 mg/dL (ref 8–23)
CO2: 24 mmol/L (ref 22–32)
Calcium: 8 mg/dL — ABNORMAL LOW (ref 8.9–10.3)
Chloride: 106 mmol/L (ref 98–111)
Creatinine, Ser: 0.63 mg/dL (ref 0.44–1.00)
GFR, Estimated: >60 mL/min (ref >60)
Glucose, Bld: 176 mg/dL — ABNORMAL HIGH (ref 70–99)
Potassium: 3.4 mmol/L — ABNORMAL LOW (ref 3.5–5.1)
Sodium: 137 mmol/L (ref 135–145)
Total Bilirubin: 0.7 mg/dL (ref 0.3–1.2)
Total Protein: 4.1 g/dL — ABNORMAL LOW (ref 6.5–8.1)

## 2021-04-01 LAB — GLUCOSE, CAPILLARY
Glucose-Capillary: 189 mg/dL — ABNORMAL HIGH (ref 70–99)
Glucose-Capillary: 340 mg/dL — ABNORMAL HIGH (ref 70–99)

## 2021-04-01 LAB — MAGNESIUM: Magnesium: 1.7 mg/dL (ref 1.7–2.4)

## 2021-04-01 LAB — CBG MONITORING, ED
Glucose-Capillary: 184 mg/dL — ABNORMAL HIGH (ref 70–99)
Glucose-Capillary: 197 mg/dL — ABNORMAL HIGH (ref 70–99)

## 2021-04-01 MED ORDER — ACETAMINOPHEN 325 MG PO TABS
650.0000 mg | ORAL_TABLET | Freq: Once | ORAL | Status: AC
  Administered 2021-04-01: 02:00:00 650 mg via ORAL
  Filled 2021-04-01: qty 2

## 2021-04-01 MED ORDER — ONDANSETRON HCL 4 MG PO TABS: 4.0000 mg | ORAL_TABLET | Freq: Four times a day (QID) | ORAL | Status: DC | PRN

## 2021-04-01 MED ORDER — INSULIN ASPART 100 UNIT/ML IJ SOLN
0.0000 [IU] | Freq: Three times a day (TID) | INTRAMUSCULAR | Status: DC
Start: 2021-04-01 — End: 2021-04-04
  Administered 2021-04-01 (×2): 3 [IU] via SUBCUTANEOUS
  Administered 2021-04-01: 22:00:00 11 [IU] via SUBCUTANEOUS
  Administered 2021-04-02: 18:00:00 8 [IU] via SUBCUTANEOUS
  Administered 2021-04-02: 13:00:00 3 [IU] via SUBCUTANEOUS
  Administered 2021-04-03 (×2): 2 [IU] via SUBCUTANEOUS
  Administered 2021-04-03: 17:00:00 15 [IU] via SUBCUTANEOUS
  Administered 2021-04-03 – 2021-04-04 (×3): 3 [IU] via SUBCUTANEOUS

## 2021-04-01 MED ORDER — ACETAMINOPHEN 650 MG RE SUPP: 650.0000 mg | Freq: Four times a day (QID) | RECTAL | Status: DC | PRN

## 2021-04-01 MED ORDER — POLYETHYLENE GLYCOL 3350 17 G PO PACK: 17.0000 g | PACK | Freq: Every day | ORAL | Status: DC | PRN

## 2021-04-01 MED ORDER — DEXAMETHASONE 2 MG PO TABS
2.0000 mg | ORAL_TABLET | Freq: Every day | ORAL | Status: DC
  Administered 2021-04-02 – 2021-04-04 (×3): 2 mg via ORAL
  Filled 2021-04-01 (×3): qty 1

## 2021-04-01 MED ORDER — DEXAMETHASONE 4 MG PO TABS
4.0000 mg | ORAL_TABLET | Freq: Once | ORAL | Status: AC
  Administered 2021-04-01: 10:00:00 4 mg via ORAL
  Filled 2021-04-01: qty 1

## 2021-04-01 MED ORDER — ATORVASTATIN CALCIUM 40 MG PO TABS
40.0000 mg | ORAL_TABLET | Freq: Every day | ORAL | Status: DC
  Administered 2021-04-01 – 2021-04-04 (×4): 40 mg via ORAL
  Filled 2021-04-01 (×4): qty 1

## 2021-04-01 MED ORDER — ONDANSETRON HCL 4 MG/2ML IJ SOLN: 4.0000 mg | Freq: Four times a day (QID) | INTRAMUSCULAR | Status: DC | PRN

## 2021-04-01 MED ORDER — DEXAMETHASONE 2 MG PO TABS: 2.0000 mg | ORAL_TABLET | ORAL | Status: DC

## 2021-04-01 MED ORDER — POTASSIUM CHLORIDE CRYS ER 20 MEQ PO TBCR
40.0000 meq | EXTENDED_RELEASE_TABLET | Freq: Once | ORAL | Status: AC
  Administered 2021-04-01: 10:00:00 40 meq via ORAL
  Filled 2021-04-01: qty 2

## 2021-04-01 MED ORDER — DEXAMETHASONE 4 MG PO TABS
4.0000 mg | ORAL_TABLET | Freq: Once | ORAL | Status: AC
  Administered 2021-04-01: 02:00:00 4 mg via ORAL
  Filled 2021-04-01: qty 1

## 2021-04-01 MED ORDER — FAMOTIDINE 20 MG PO TABS
20.0000 mg | ORAL_TABLET | Freq: Every day | ORAL | Status: DC
  Administered 2021-04-01 – 2021-04-04 (×4): 20 mg via ORAL
  Filled 2021-04-01 (×4): qty 1

## 2021-04-01 MED ORDER — ACETAMINOPHEN 325 MG PO TABS
650.0000 mg | ORAL_TABLET | Freq: Four times a day (QID) | ORAL | Status: DC | PRN
  Administered 2021-04-01: 10:00:00 650 mg via ORAL
  Filled 2021-04-01: qty 2

## 2021-04-01 MED ORDER — AMITRIPTYLINE HCL 25 MG PO TABS
50.0000 mg | ORAL_TABLET | Freq: Every day | ORAL | Status: DC
  Administered 2021-04-01 – 2021-04-03 (×4): 50 mg via ORAL
  Filled 2021-04-01 (×2): qty 2
  Filled 2021-04-01: qty 1
  Filled 2021-04-01: qty 2
  Filled 2021-04-01: qty 1

## 2021-04-01 NOTE — Assessment & Plan Note (Signed)
.   Continuing home regimen of lipid lowering therapy.  

## 2021-04-01 NOTE — Evaluation (Signed)
Occupational Therapy Evaluation Patient Details Name: Katelyn Kennedy MRN: [id number] DOB: [birth year] Today's Date: 04/01/2021   History of Present Illness This 75 y.o. female presenting after fall at home. Of note, most recently patient was hospitalized at [facility] long hospital from 11/23 until 12/7 due to worsening left-sided weakness found to have progression of metastatic disease to the brain with new hemorrhagic lesions with associated vasogenic edema and midline shift. 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   Clinical Impression   Pt presents with L-sided weakness, decreased balance, and decreased activity tolerance. Currently requiring setup - Min A for ADLs and functional transfers/mobility. Pt lives alone and will have inconsistent assistance available at home. Pt is concerned about her ability to return home safely and would like short-term SNF placement to improve strength and safety/independence prior to return home. This seems appropriate given current deficits and lack of consistent assistance at home. Will follow acutely.     Recommendations for follow up therapy are one component of a multi-disciplinary discharge planning process, led by the attending physician.  Recommendations may be updated based on patient status, additional functional criteria and insurance authorization.   Follow Up Recommendations  Skilled nursing-short term rehab (<3 hours/day)    Assistance Recommended at Discharge Frequent or constant Supervision/Assistance  Functional Status Assessment  Patient has had a recent decline in their functional status and demonstrates the ability to make significant improvements in function in a reasonable and predictable amount of time.  Equipment Recommendations  Other (comment) (Defer)    Recommendations for Other Services        Precautions / Restrictions Precautions Precautions: Fall Precaution Comments: monitor BP Restrictions Weight Bearing Restrictions: No      Mobility Bed Mobility Overal bed mobility: Needs Assistance Bed Mobility: Supine to Sit;Sit to Supine     Supine to sit: Mod assist Sit to supine: Mod assist        Transfers Overall transfer level: Needs assistance Equipment used: Rolling walker (2 wheels) Transfers: Sit to/from Stand Sit to Stand: Min guard                  Balance Overall balance assessment: Needs assistance Sitting-balance support: Feet unsupported Sitting balance-Leahy Scale: Fair     Standing balance support: No upper extremity supported Standing balance-Leahy Scale: Fair                             ADL either performed or assessed with clinical judgement   ADL Overall ADL's : Needs assistance/impaired Eating/Feeding: Independent;Sitting   Grooming: Min guard;Standing   Upper Body Bathing: Set up;Sitting   Lower Body Bathing: Minimal assistance   Upper Body Dressing : Set up;Sitting   Lower Body Dressing: Minimal assistance;Sit to/from stand   Toilet Transfer: Min guard;Ambulation;Rolling walker (2 wheels)   Toileting- Clothing Manipulation and Hygiene: Min guard;Sit to/from stand       Functional mobility during ADLs: Min guard;Rolling walker (2 wheels)       Vision Baseline Vision/History: 1 Wears glasses Patient Visual Report: No change from baseline       Perception     Praxis      Pertinent Vitals/Pain Pain Assessment: No/denies pain     Hand Dominance Right   Extremity/Trunk Assessment Upper Extremity Assessment Upper Extremity Assessment: Generalized weakness;LUE deficits/detail LUE Deficits / Details: LUE  weaker than RUE LUE Sensation: decreased light touch LUE Coordination: decreased fine motor;decreased gross motor   Lower Extremity Assessment Lower Extremity Assessment: Defer to PT  evaluation   Cervical / Trunk Assessment Cervical / Trunk Assessment: Normal   Communication Communication Communication: No difficulties   Cognition Arousal/Alertness: Awake/alert Behavior During Therapy: WFL for tasks assessed/performed Overall Cognitive Status: Within Functional Limits for tasks assessed                                       General Comments  Pt orthostatic during eval. BP 115/83 in supine, 80s/70s in standing, 90s/70s after standing for 3 minutes. Reports no dizziness/light headedness    Exercises     Shoulder Instructions      Home Living Family/patient expects to be discharged to:: Private residence Living Arrangements: Alone Available Help at Discharge: Family;Available PRN/intermittently Type of Home: Apartment Home Access: Stairs to enter Entrance Stairs-Number of Steps: 1 Entrance Stairs-Rails: None Home Layout: One level     Bathroom Shower/Tub: Teacher, early years/pre: Standard     Home Equipment: Grab bars - tub/shower;Rollator (4 wheels);Shower seat          Prior Functioning/Environment Prior Level of Function : Independent/Modified Independent;Driving                        OT Problem List: Decreased strength;Decreased coordination;Cardiopulmonary status limiting activity;Decreased cognition;Impaired sensation;Decreased activity tolerance;Decreased safety awareness;Impaired balance (sitting and/or standing);Decreased knowledge of use of DME or AE;Impaired vision/perception;Decreased knowledge of precautions;Impaired UE functional use      OT Treatment/Interventions: Self-care/ADL training;Therapeutic exercise;Therapeutic activities;Neuromuscular education;Cognitive remediation/compensation;Energy conservation;Visual/perceptual remediation/compensation;Patient/family education;DME and/or AE instruction;Balance training    OT Goals(Current goals can be found in the care plan section) Acute Rehab OT  Goals Patient Stated Goal: SNF then home OT Goal Formulation: With patient Time For Goal Achievement: 04/15/21 Potential to Achieve Goals: Good  OT Frequency: Min 2X/week   Barriers to D/C: Decreased caregiver support          Co-evaluation              AM-PAC OT "6 Clicks" Daily Activity     Outcome Measure Help from another person eating meals?: None Help from another person taking care of personal grooming?: A Little Help from another person toileting, which includes using toliet, bedpan, or urinal?: A Little Help from another person bathing (including washing, rinsing, drying)?: A Little Help from another person to put on and taking off regular upper body clothing?: A Little Help from another person to put on and taking off regular lower body clothing?: A Little 6 Click Score: 19   End of Session Equipment Utilized During Treatment: Rolling walker (2 wheels) Nurse Communication: Mobility status  Activity Tolerance: Patient tolerated treatment well Patient left: in bed;with call bell/phone within reach  OT Visit Diagnosis: Unsteadiness on feet (R26.81);Other symptoms and signs involving cognitive function;Other symptoms and signs involving the nervous system (R29.898);Muscle weakness (generalized) (M62.81);History of falling (Z91.81)                Time: 4540-9811 OT Time Calculation (min): 26 min Charges:  OT General Charges $OT Visit: 1 Visit OT Evaluation $OT Eval Low Complexity: 1 Low  Frederik Standley C, OT/L  [facility] [place] 04/01/2021, 11:54 AM

## 2021-04-01 NOTE — Assessment & Plan Note (Signed)
·   Evidence of improving edema and hemorrhages on review of CT imaging of the head with Dr. Lorrin Goodell with neurology  Per my discussion with neurology, no indication for repeat MRI imaging of the brain at this time unless patient exhibits worsening lethargy or new neurologic deficits  Continuing tapering regimen of Decadron per neurosurgery recommendations during previous hospitalization  Performing serial neurologic checks

## 2021-04-01 NOTE — Assessment & Plan Note (Signed)
·   Follows with Dr. Julien Nordmann with [facility] health oncology  Continue outpatient follow-up

## 2021-04-01 NOTE — Assessment & Plan Note (Addendum)
·   Patient reports mechanical fall and denies any worsening or new neurologic deficit  Orthostatic hypotension identified in the emergency department which may have contributed to patient's fall  CT imaging of the brain without contrast reveals improving vasogenic edema and midline shift without new areas of disease  Obtaining PT evaluation  Patient may benefit from short course of subacute rehabilitation in a skilled nursing facility  Patient is already undergone a qualifying 3 midnight stay in late November/early December  Case management referral additionally placed

## 2021-04-01 NOTE — Assessment & Plan Note (Signed)
·   Identified orthostatic hypotension in the emergency department 129/53 lying to 97/57 sitting and 99/56 standing  Presumed to be secondary to poor oral intake  Providing patient with series of small boluses of LR followed by LR infusion  Will reassess orthostatic vital signs in the morning

## 2021-04-01 NOTE — ED Notes (Signed)
Pt ambulated with a steady gait using a walker.

## 2021-04-01 NOTE — ED Notes (Signed)
A&O x 4. No complaints. No signs of distress. VSS.

## 2021-04-01 NOTE — ED Notes (Signed)
Amy Truddie Crumble (Spouse) of Katelyn Kennedy called asking for an update. Please call call her back at [phone].

## 2021-04-01 NOTE — Progress Notes (Signed)
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   Of note, most recently patient was hospitalized at [facility] long hospital from 11/23 until 12/7 due to worsening left-sided weakness found to have progression of metastatic disease to the brain with new hemorrhagic lesions with associated vasogenic edema and midline shift.  Patient was hospitalized with neurosurgery consultation.  Patient was treated with 1 week of prophylactic Keppra as well as initiation of steroid therapy.  Radiation oncology was also consulted and patient was initiated whole brain radiation therapy with completion on date of discharge which was 12/7.   Patient explains that early this morning at approximately 3 AM she was attempting to walk to the bathroom using her walker which she states rolled out from under her resulting in a fall.  Patient denies any associated loss of consciousness or new focal weakness.  Due to patient's weakness she was unable to get up for the remainder of the morning.  Patient denies any associated slurred speech or visual changes.  Patient reports striking her head on a basket that was beside her when she fell.  Patient denies any additional symptoms such as fevers, sick contacts, cough, shortness of breath abdominal pain or dysuria.   Patient's neighbor came by to check on her this morning and found her down contacting EMS who promptly came to evaluate the patient bring her into [facility] emergency department for evaluation.   Upon evaluation in the  emergency department CT imaging of the head redemonstrated multifocal intracranial metastatic disease revealing stable or decreased hemorrhages with decreasing vasogenic edema and reduced leftward midline shift of 3 mm.  Urinalysis revealed no evidence of infection.  Chest x-ray revealed no evidence of cardiopulmonary disease.  Patient was administered a 500 cc LR bolus.  Due to concerns for patient's safety and ongoing weakness the hospitalist group was then called to assess the patient for admission to the hospital.  Assessment & Plan:   Principal Problem:   Fall at home, initial encounter Active Problems:   Type 2 diabetes mellitus without complication, without long-term current use of insulin ([place])   Hyperlipidemia associated with type 2 diabetes mellitus ([place])   Intraparenchymal hemorrhage of brain ([place])   Vasogenic brain edema ([facility])   Non-small cell lung cancer metastatic to brain ([place])   Orthostatic hypotension  Fall at home generalized weakness/orthostatic hypotension: CT imaging of the brain without contrast reveals improving vasogenic edema and midline shift without new areas of disease. No loss of consciousness or any symptoms prior to episode.  This is purely secondary generalized weakness due to her spreading cancer with some contribution from orthostatic hypotension, continue IV fluids.  Check.  Take orthostatic vitals.  PT OT to see her, she will likely need to go to SNF, patient is agreeable.  TOC consulted.   Metastatic primary lung cancer/vasogenic brain edema/intraparenchymal hemorrhage of brain: Evidence of improving edema and hemorrhages on review of CT imaging of the head with Dr. Lorrin Goodell with neurology. Per admitting hospitalist's discussion with neurology, no indication for repeat MRI imaging of the brain at this time unless patient exhibits worsening lethargy or  new neurologic deficits. Continuing tapering regimen of Decadron per neurosurgery recommendations during  previous hospitalization. Performing serial neurologic checks   Type 2 diabetes mellitus without complication, without long-term current use of insulin ([place]): Hemoglobin A1c only 4.9 just 3 weeks ago, currently hyperglycemic, likely secondary to being on steroids, continue SSI.   Hyperlipidemia: Continue statin  Hypokalemia: Will replace.  DVT prophylaxis: SCDs Start: 04/01/21 0012   Code Status: DNR  Family Communication:  None present at bedside.  Plan of care discussed with patient in length and he verbalized understanding and agreed with it.  Status is: Observation  The patient will require care spanning > 2 midnights and should be moved to inpatient because: Unsafe discharge, needs SNF discharge, pending placement.  Estimated body mass index is 27.61 kg/m as calculated from the following:   Height as of this encounter: 4\' 11"  (1.499 m).   Weight as of this encounter: 62 kg.  Pressure Injury 03/06/21 Coccyx Medial Stage 1 -  Intact skin with non-blanchable redness of a localized area usually over a bony prominence. red area with small amount that does not blanch (Active)  03/06/21 2030  Location: Coccyx  Location Orientation: Medial  Staging: Stage 1 -  Intact skin with non-blanchable redness of a localized area usually over a bony prominence.  Wound Description (Comments): red area with small amount that does not blanch  Present on Admission: Yes    Nutritional Assessment: Body mass index is 27.61 kg/m.Marland Kitchen Seen by dietician.  I agree with the assessment and plan as outlined below: Nutrition Status:   Skin Assessment: I have examined the patient's skin and I agree with the wound assessment as performed by the wound care RN as outlined below: Pressure Injury 03/06/21 Coccyx Medial Stage 1 -  Intact skin with non-blanchable redness of a localized area usually over a bony prominence. red area with small amount that does not blanch (Active)  03/06/21 2030  Location: Coccyx   Location Orientation: Medial  Staging: Stage 1 -  Intact skin with non-blanchable redness of a localized area usually over a bony prominence.  Wound Description (Comments): red area with small amount that does not blanch  Present on Admission: Yes    Consultants:  None  Procedures:  None  Antimicrobials:  Anti-infectives (From admission, onward)    None          Subjective: Seen and examined.  Fully alert and oriented.  She has no complaints now.  She is willing to go to SNF.  Objective: Vitals:   04/01/21 0500 04/01/21 0600 04/01/21 0629 04/01/21 0700  BP: 118/72 120/64  (!) 115/59  Pulse: (!) 104 (!) 101 (!) 105 (!) 103  Resp: 12 11 14 10   Temp:   98.3 F (36.8 C)   TempSrc:      SpO2: 96% 96% 96% 95%  Weight:      Height:        Intake/Output Summary (Last 24 hours) at 04/01/2021 0801 Last data filed at 03/31/2021 1840 Gross per 24 hour  Intake 500 ml  Output --  Net 500 ml   Filed Weights   03/31/21 1231  Weight: 62 kg    Examination:  General exam: Appears calm and comfortable  Respiratory system: Clear to auscultation. Respiratory effort normal. Cardiovascular system: S1 & S2 heard, RRR. No JVD, murmurs, rubs, gallops or clicks. No pedal edema. Gastrointestinal system: Abdomen is nondistended, soft and nontender. No organomegaly or masses felt. Normal bowel sounds heard. Central nervous system:  Alert and oriented. No focal neurological deficits. Extremities: Symmetric 5 x 5 power. Skin: No rashes, lesions or ulcers Psychiatry: Judgement and insight appear normal. Mood & affect appropriate.    Data Reviewed: I have personally reviewed following labs and imaging studies  CBC: Recent Labs  Lab 03/31/21 1354 04/01/21 0426  WBC 9.1 5.8  NEUTROABS  --  4.5  HGB 14.7 12.2  HCT 45.7 38.5  MCV 96.0 96.0  PLT 106* 89*   Basic Metabolic Panel: Recent Labs  Lab 03/31/21 1354 04/01/21 0426  NA 142 137  K 3.8 3.4*  CL 107 106  CO2 27 24   GLUCOSE 148* 176*  BUN 20 19  CREATININE 0.59 0.63  CALCIUM 9.0 8.0*  MG  --  1.7   GFR: Estimated Creatinine Clearance: 47.1 mL/min (by C-G formula based on SCr of 0.63 mg/dL). Liver Function Tests: Recent Labs  Lab 03/31/21 1354 04/01/21 0426  AST 29 30  ALT 75* 57*  ALKPHOS 73 65  BILITOT 1.0 0.7  PROT 5.5* 4.1*  ALBUMIN 2.9* 2.1*   No results for input(s): LIPASE, AMYLASE in the last 168 hours. No results for input(s): AMMONIA in the last 168 hours. Coagulation Profile: Recent Labs  Lab 03/31/21 1354  INR 0.9   Cardiac Enzymes: Recent Labs  Lab 03/31/21 1354  CKTOTAL 63   BNP (last 3 results) No results for input(s): PROBNP in the last 8760 hours. HbA1C: No results for input(s): HGBA1C in the last 72 hours. CBG: No results for input(s): GLUCAP in the last 168 hours. Lipid Profile: No results for input(s): CHOL, HDL, LDLCALC, TRIG, CHOLHDL, LDLDIRECT in the last 72 hours. Thyroid Function Tests: No results for input(s): TSH, T4TOTAL, FREET4, T3FREE, THYROIDAB in the last 72 hours. Anemia Panel: No results for input(s): VITAMINB12, FOLATE, FERRITIN, TIBC, IRON, RETICCTPCT in the last 72 hours. Sepsis Labs: No results for input(s): PROCALCITON, LATICACIDVEN in the last 168 hours.  Recent Results (from the past 240 hour(s))  Resp Panel by RT-PCR (Flu A&B, Covid) Nasopharyngeal Swab     Status: None   Collection Time: 03/31/21  1:55 PM   Specimen: Nasopharyngeal Swab; Nasopharyngeal(NP) swabs in vial transport medium  Result Value Ref Range Status   SARS Coronavirus 2 by RT PCR NEGATIVE NEGATIVE Final    Comment: (NOTE) SARS-CoV-2 target nucleic acids are NOT DETECTED.  The SARS-CoV-2 RNA is generally detectable in upper respiratory specimens during the acute phase of infection. The lowest concentration of SARS-CoV-2 viral copies this assay can detect is 138 copies/mL. A negative result does not preclude SARS-Cov-2 infection and should not be used as the  sole basis for treatment or other patient management decisions. A negative result may occur with  improper specimen collection/handling, submission of specimen other than nasopharyngeal swab, presence of viral mutation(s) within the areas targeted by this assay, and inadequate number of viral copies(<138 copies/mL). A negative result must be combined with clinical observations, patient history, and epidemiological information. The expected result is Negative.  Fact Sheet for Patients:  EntrepreneurPulse.com.au  Fact Sheet for Healthcare Providers:  IncredibleEmployment.be  This test is no t yet approved or cleared by the [place] FDA and  has been authorized for detection and/or diagnosis of SARS-CoV-2 by FDA under an Emergency Use Authorization (EUA). This EUA will remain  in effect (meaning this test can be used) for the duration of the COVID-19 declaration under Section 564(b)(1) of the Act, 21 U.S.C.section 360bbb-3(b)(1), unless the authorization is terminated  or revoked  sooner.       Influenza A by PCR NEGATIVE NEGATIVE Final   Influenza B by PCR NEGATIVE NEGATIVE Final    Comment: (NOTE) The Xpert Xpress SARS-CoV-2/FLU/RSV plus assay is intended as an aid in the diagnosis of influenza from Nasopharyngeal swab specimens and should not be used as a sole basis for treatment. Nasal washings and aspirates are unacceptable for Xpert Xpress SARS-CoV-2/FLU/RSV testing.  Fact Sheet for Patients: EntrepreneurPulse.com.au  Fact Sheet for Healthcare Providers: IncredibleEmployment.be  This test is not yet approved or cleared by the [place] FDA and has been authorized for detection and/or diagnosis of SARS-CoV-2 by FDA under an Emergency Use Authorization (EUA). This EUA will remain in effect (meaning this test can be used) for the duration of the COVID-19 declaration under Section 564(b)(1) of the  Act, 21 U.S.C. section 360bbb-3(b)(1), unless the authorization is terminated or revoked.  Performed at [facility], [place] [address], [place], [place] [address]       Radiology Studies: CT HEAD WO CONTRAST  Result Date: 03/31/2021 CLINICAL DATA:  Provided history: Head trauma, minor. Additional history provided: Mechanical fall last night. EXAM: CT HEAD WITHOUT CONTRAST TECHNIQUE: Contiguous axial images were obtained from the base of the skull through the vertex without intravenous contrast. COMPARISON:  Brain MRI 03/06/2021.  Head CT 03/05/2021. FINDINGS: Brain: Cerebral volume is normal. Redemonstrated sequela of prior right parietal craniotomy and postsurgical changes within the underlying posterior right frontal lobe. The patient has multiple known hemorrhagic parenchymal metastases within the bilateral frontal lobes, as well as fairly extensive hemorrhagic dural-based metastatic disease along the falx and overlying the right cerebral hemisphere. Multifocal hemorrhage associated with the metastatic lesions has decreased or remained stable since the prior head CT of 03/05/2021. Similarly, sites of vasogenic edema in the right frontal lobe have remained stable or decreased as compared to the prior examination. Most notably, there is a persistent focus of moderate vasogenic edema within the anterior right frontal lobe and callosal genu. Small foci of edema within the right parietooccipital lobes have not significantly changed. Within the limitations of a noncontrast head CT, no new sites of intracranial metastatic disease are identified. Mass effect has decreased. There is now 3 mm leftward midline shift measured at the level of the septum pellucidum (previously 8 mm). No demarcated cortical infarct. Vascular: Maintained flow voids within the proximal large arterial vessels. Atherosclerotic calcifications. Skull: Right parietal cranioplasty. No calvarial fracture. No focal suspicious osseous  lesion. Sinuses/Orbits: Visualized orbits show no acute finding. No significant paranasal sinus disease. IMPRESSION: Known multifocal intracranial metastatic disease with both parenchymal and dural-based lesions. Hemorrhage associated with the intracranial metastases has remained stable or decreased when compared with the prior head CT of 03/05/2021. Similarly, multifocal vasogenic edema associated with these lesions has decreased or remained stable. Most notably, there is a focus of moderate residual vasogenic edema within the anterior right frontal lobe. Mass effect has decreased, now with 3 mm leftward midline shift (previously 8 mm). Within the limitations of a noncontrast head CT, no new intracranial metastasis is identified. Redemonstrated sequela of prior right parietal calvarium and postsurgical changes in the underlying posterior right frontal lobe. Electronically Signed   By: Kellie Simmering D.O.   On: 03/31/2021 15:15   CT CERVICAL SPINE WO CONTRAST  Result Date: 03/31/2021 CLINICAL DATA:  Neck trauma (Age >= 65y) EXAM: CT CERVICAL SPINE WITHOUT CONTRAST TECHNIQUE: Multidetector CT imaging of the cervical spine was performed without intravenous contrast. Multiplanar CT image reconstructions  were also generated. COMPARISON:  None. FINDINGS: Alignment: Mild reversal of the normal cervical lordosis. Slight anterolisthesis of C3 on C4, favor degenerative given facet arthropathy at this level. Skull base and vertebrae: Vertebral body heights are maintained. No evidence of acute fracture. Bony demineralization Soft tissues and spinal canal: No prevertebral fluid or swelling. No visible canal hematoma. Disc levels: Moderate degenerative disease at C5-C6 and C6-C7 where there is disc height loss, endplate sclerosis and posterior disc osteophyte complex. Multilevel facet arthropathy. Upper chest: Similar size of an 8 mm solid/subsolid pulmonary nodule in the right upper lobe, better characterized on prior CT  chest from 11/11/2020. Other: Bilateral thyroid nodules, better characterized on ultrasound of the thyroid from 04/26/2019 (ref: J Am Coll Radiol. 2015 Feb;12(2): 143-50). IMPRESSION: 1. No evidence of acute fracture or traumatic malalignment. 2. Multilevel degenerative change, detailed above. 3. Similar size of an 8 mm solid/subsolid pulmonary nodule in the right upper lobe, better characterized on prior CT chest. Recommend continued attention on follow-up imaging. Electronically Signed   By: Margaretha Sheffield M.D.   On: 03/31/2021 15:00   DG Pelvis Portable  Result Date: 03/31/2021 CLINICAL DATA:  [place] today EXAM: PORTABLE PELVIS 1-2 VIEWS COMPARISON:  None. FINDINGS: No evidence of regional fracture. There is osteoarthritis of the right hip joint. There is lower lumbar degenerative change. IMPRESSION: Normal except for chronic osteoarthritis of the hip joint and lower lumbar degenerative change. No acute or traumatic finding. Electronically Signed   By: Nelson Chimes M.D.   On: 03/31/2021 13:17   DG Chest Port 1 View  Result Date: 03/31/2021 CLINICAL DATA:  Fall EXAM: PORTABLE CHEST 1 VIEW COMPARISON:  Chest radiographs, 01/19/2012, CT chest, 8,002 FINDINGS: The heart size and mediastinal contours are within normal limits. Bandlike scarring and fibrosis of the perihilar right lung. The visualized skeletal structures are unremarkable. IMPRESSION: No acute abnormality of the lungs. Bandlike scarring and fibrosis of the perihilar right lung, unchanged compared to prior CT. Electronically Signed   By: Delanna Ahmadi M.D.   On: 03/31/2021 13:17    Scheduled Meds:  amitriptyline  50 mg Oral QHS   atorvastatin  40 mg Oral Daily   [START ON 04/02/2021] dexamethasone  2 mg Oral Daily   [START ON 04/07/2021] dexamethasone  2 mg Oral QODAY   dexamethasone  4 mg Oral Once   famotidine  20 mg Oral Daily   insulin aspart  0-15 Units Subcutaneous TID AC & HS   potassium chloride  40 mEq Oral Once    Continuous Infusions:  lactated ringers 125 mL/hr at 04/01/21 0629     LOS: 0 days   Time spent: 30 minutes   Darliss Cheney, MD [facility]  04/01/2021, 8:01 AM  Please page via [place] and do not message via secure chat for anything urgent. Secure chat can be used for anything non urgent.  How to contact the [facility] Attending or Consulting provider [place] or covering provider during after hours [place], for this patient?  Check the care team in [facility] and look for a) attending/consulting [facility] provider listed and b) the [facility] team listed. Page or secure chat [phone]. Log into [email or web address] and use [place]'s universal password to access. If you do not have the password, please contact the hospital operator. Locate the [facility] provider you are looking for under [facility] and page to a number that you can be directly reached. If you still have difficulty reaching the provider, please page the [facility] (Director  on Call) for the Hospitalists listed on amion for assistance.

## 2021-04-01 NOTE — Evaluation (Signed)
Physical Therapy Evaluation Patient Details Name: Katelyn Kennedy MRN: [id number] DOB: Sep 04, 1945 Today's Date: 04/01/2021  History of Present Illness  This 75 y.o. female presenting after fall at home. Of note, most recently patient was hospitalized at [facility] long hospital from 11/23 until 12/7 due to worsening left-sided weakness found to have progression of metastatic disease to the brain with new hemorrhagic lesions with associated vasogenic edema and midline shift. 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  Clinical Impression  Pt was seen for mobility on RW with help and consideration for her mets and recent hospitalizations.  Her motivation is good but is weak and esp LLE is mildly unstable even on RW.  Given her fall history and the challenge of her medical condition, will recommend her to SNF care for follow up with strength and control of safety, and may yet be appropriate for LTC if her condition is changing.  Follow acutely to get strength, standing balance and gait control back to home care level as her tolerance to move permits.  Pt is going to need guidance on limits as she is not fully aware of her tolerances, and is a bit weaker than she sees.       Recommendations for follow up therapy are one component of a multi-disciplinary discharge planning process, led by the attending physician.  Recommendations may be updated based on patient status, additional functional criteria and insurance authorization.  Follow Up Recommendations Skilled nursing-short term rehab (<3 hours/day)    Assistance Recommended at Discharge Frequent or constant Supervision/Assistance  Functional Status Assessment Patient has had a recent decline in their functional status and demonstrates the ability to make significant improvements in function in a reasonable and  predictable amount of time.  Equipment Recommendations  None recommended by PT    Recommendations for Other Services       Precautions / Restrictions Precautions Precautions: Fall Precaution Comments: monitor BP Restrictions Weight Bearing Restrictions: No      Mobility  Bed Mobility Overal bed mobility: Needs Assistance Bed Mobility: Supine to Sit;Sit to Supine     Supine to sit: Mod assist Sit to supine: Mod assist   General bed mobility comments: Pt is not willing to stay up too long, very tired out    Transfers Overall transfer level: Needs assistance Equipment used: Rolling walker (2 wheels) Transfers: Sit to/from Stand Sit to Stand: Min guard                Ambulation/Gait Ambulation/Gait assistance: Min guard Gait Distance (Feet): 16 Feet Assistive device: Rolling walker (2 wheels) Gait Pattern/deviations: Step-through pattern;Decreased stride length;Drifts right/left Gait velocity: decreased Gait velocity interpretation: <1.31 ft/sec, indicative of household ambulator Pre-gait activities: orthstatics checked with no issues General Gait Details: tired from the effort but can maneuver with help, mainly hindered by extra lines.  Difficulty counting on LUE and LLE to maneuver corners and negotiate obstacles  Stairs            Wheelchair Mobility    Modified Rankin (Stroke Patients Only)       Balance Overall balance assessment: Needs assistance Sitting-balance support: Feet supported;Feet unsupported Sitting balance-Leahy Scale: Fair     Standing balance support: Bilateral upper extremity supported;During functional activity Standing balance-Leahy Scale: Fair Standing balance comment: RW for steadying on standing, and required for walking  Pertinent Vitals/Pain Pain Assessment: No/denies pain    Home Living Family/patient expects to be discharged to:: Private residence Living Arrangements:  Alone Available Help at Discharge: Family;Available PRN/intermittently Type of Home: Apartment Home Access: Stairs to enter Entrance Stairs-Rails: None Entrance Stairs-Number of Steps: 1   Home Layout: One level Home Equipment: Grab bars - tub/shower;Rollator (4 wheels);Shower seat Additional Comments: has family and friends who can help her at home    Prior Function Prior Level of Function : Independent/Modified Independent;Driving             Mobility Comments: Rollator for gait       Hand Dominance   Dominant Hand: Right    Extremity/Trunk Assessment   Upper Extremity Assessment Upper Extremity Assessment: Defer to OT evaluation    Lower Extremity Assessment Lower Extremity Assessment: Generalized weakness    Cervical / Trunk Assessment Cervical / Trunk Assessment: Normal  Communication   Communication: No difficulties  Cognition Arousal/Alertness: Awake/alert Behavior During Therapy: WFL for tasks assessed/performed Overall Cognitive Status: Within Functional Limits for tasks assessed                                          General Comments General comments (skin integrity, edema, etc.): BP supported with standing and walking    Exercises     Assessment/Plan    PT Assessment Patient needs continued PT services  PT Problem List Decreased strength;Decreased balance;Decreased mobility;Decreased safety awareness       PT Treatment Interventions DME instruction;Gait training;Stair training;Functional mobility training;Therapeutic activities;Therapeutic exercise;Balance training;Neuromuscular re-education;Patient/family education    PT Goals (Current goals can be found in the Care Plan section)  Acute Rehab PT Goals Patient Stated Goal: to go home with no help needed PT Goal Formulation: With patient Time For Goal Achievement: 04/14/21 Potential to Achieve Goals: Good    Frequency Min 2X/week   Barriers to discharge Decreased  caregiver support;Inaccessible home environment has a step to enter and has no dedicated family to s tay with her    Co-evaluation               AM-PAC PT "6 Clicks" Mobility  Outcome Measure Help needed turning from your back to your side while in a flat bed without using bedrails?: A Little Help needed moving from lying on your back to sitting on the side of a flat bed without using bedrails?: A Little Help needed moving to and from a bed to a chair (including a wheelchair)?: A Little Help needed standing up from a chair using your arms (e.g., wheelchair or bedside chair)?: A Little Help needed to walk in hospital room?: A Little Help needed climbing 3-5 steps with a railing? : Total 6 Click Score: 16    End of Session Equipment Utilized During Treatment: Gait belt Activity Tolerance: Patient tolerated treatment well Patient left: in bed;with call bell/phone within reach Nurse Communication: Mobility status PT Visit Diagnosis: Unsteadiness on feet (R26.81);Muscle weakness (generalized) (M62.81);Difficulty in walking, not elsewhere classified (R26.2)    Time: 5638-7564 PT Time Calculation (min) (ACUTE ONLY): 21 min   Charges:   PT Evaluation $PT Eval Moderate Complexity: 1 Mod         Ramond Dial 04/01/2021, 4:45 PM  Mee Hives, PT PhD Acute Rehab Dept. Number: [name]

## 2021-04-01 NOTE — ED Notes (Signed)
Ambulatory to bathroom with walker, gait steady. No complaints. VSS.

## 2021-04-01 NOTE — Assessment & Plan Note (Signed)
See assessment and plan above

## 2021-04-01 NOTE — ED Provider Notes (Signed)
I assumed care of this patient.  Please see previous provider note for further details of Hx, PE.  Briefly patient is a 75 y.o. female with known metastatic disease s/p radiation on steroids.  Who had a fall last night and prolonged downtime.  CT head reveals improved vasogenic edema and intraparenchymal bleed. No other injuries noted on work-up.  Blood work is reassuring.  Awaiting UA to rule out infection.  UA negative.  Spoke with patient and daughter who reported that patient has home health and nursing that comes by twice a week.  Patient lives alone.  Has lower extremity weakness from her metastatic disease.  Patient requires increased level of care and will be admitted for rehab placement.      Fatima Blank, MD 04/01/21 [phone]

## 2021-04-01 NOTE — Assessment & Plan Note (Signed)
•   Patient been placed on Accu-Cheks before every meal and nightly with sliding scale insulin  Hemoglobin A1C performed in November 4.9%  Diabetic Diet

## 2021-04-02 LAB — GLUCOSE, CAPILLARY
Glucose-Capillary: 113 mg/dL — ABNORMAL HIGH (ref 70–99)
Glucose-Capillary: 170 mg/dL — ABNORMAL HIGH (ref 70–99)
Glucose-Capillary: 261 mg/dL — ABNORMAL HIGH (ref 70–99)
Glucose-Capillary: 126 mg/dL — ABNORMAL HIGH (ref 70–99)

## 2021-04-02 LAB — BASIC METABOLIC PANEL
Anion gap: 5 (ref 5–15)
BUN: 19 mg/dL (ref 8–23)
CO2: 26 mmol/L (ref 22–32)
Calcium: 8.6 mg/dL — ABNORMAL LOW (ref 8.9–10.3)
Chloride: 106 mmol/L (ref 98–111)
Creatinine, Ser: 0.62 mg/dL (ref 0.44–1.00)
GFR, Estimated: >60 mL/min (ref >60)
Glucose, Bld: 78 mg/dL (ref 70–99)
Potassium: 4 mmol/L (ref 3.5–5.1)
Sodium: 137 mmol/L (ref 135–145)

## 2021-04-02 NOTE — Progress Notes (Signed)
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   Of note, most recently patient was hospitalized at [facility] long hospital from 11/23 until 12/7 due to worsening left-sided weakness found to have progression of metastatic disease to the brain with new hemorrhagic lesions with associated vasogenic edema and midline shift.  Patient was hospitalized with neurosurgery consultation.  Patient was treated with 1 week of prophylactic Keppra as well as initiation of steroid therapy.  Radiation oncology was also consulted and patient was initiated whole brain radiation therapy with completion on date of discharge which was 12/7.   Patient explains that early this morning at approximately 3 AM she was attempting to walk to the bathroom using her walker which she states rolled out from under her resulting in a fall.  Patient denies any associated loss of consciousness or new focal weakness.  Due to patient's weakness she was unable to get up for the remainder of the morning.  Patient denies any associated slurred speech or visual changes.  Patient reports striking her head on a basket that was beside her when she fell.  Patient denies any additional symptoms such as fevers, sick contacts, cough, shortness of breath abdominal pain or dysuria.   Patient's neighbor came by to check on her this morning and found her down contacting EMS who promptly came to evaluate the patient bring her into [facility] emergency department for evaluation.   Upon evaluation in the  emergency department CT imaging of the head redemonstrated multifocal intracranial metastatic disease revealing stable or decreased hemorrhages with decreasing vasogenic edema and reduced leftward midline shift of 3 mm.  Urinalysis revealed no evidence of infection.  Chest x-ray revealed no evidence of cardiopulmonary disease.  Patient was administered a 500 cc LR bolus.  Due to concerns for patient's safety and ongoing weakness the hospitalist group was then called to assess the patient for admission to the hospital.  Assessment & Plan:   Principal Problem:   Fall at home, initial encounter Active Problems:   Type 2 diabetes mellitus without complication, without long-term current use of insulin ([place])   Hyperlipidemia associated with type 2 diabetes mellitus ([place])   Intraparenchymal hemorrhage of brain ([place])   Vasogenic brain edema (H[facility])   Non-small cell lung cancer metastatic to brain ([place])   Orthostatic hypotension   Fall  Fall at home generalized weakness/orthostatic hypotension: CT imaging of the brain without contrast reveals improving vasogenic edema and midline shift without new areas of disease. No loss of consciousness or any symptoms prior to episode.  This is purely secondary generalized weakness due to her spreading cancer with some contribution from orthostatic hypotension, continue IV fluids.  PT OT recommended SNF.  TOC on board for bed placement.   Metastatic primary lung cancer/vasogenic brain edema/intraparenchymal hemorrhage of brain: Evidence of improving edema and hemorrhages on review of CT imaging of the head with Dr. Lorrin Goodell with neurology. Per admitting hospitalist's discussion with neurology, no indication for repeat MRI imaging of the brain at this time unless patient exhibits worsening lethargy or new neurologic deficits. Continuing tapering regimen of Decadron per neurosurgery recommendations  during previous hospitalization. Performing serial neurologic checks    Type 2 diabetes mellitus without complication, without long-term current use of insulin ([place]): Hemoglobin A1c only 4.9 just 3 weeks ago, currently hyperglycemic, likely secondary to being on steroids, continue SSI.   Hyperlipidemia: Continue statin  Hypokalemia: Resolved.  DVT prophylaxis: SCDs Start: 04/01/21 0012   Code Status: DNR  Family Communication:  None present at bedside.  Plan of care discussed with patient in length and he verbalized understanding and agreed with it.  Status is: Inpatient  Remains inpatient appropriate because: Awaiting SNF placement.  Estimated body mass index is 27.61 kg/m as calculated from the following:   Height as of this encounter: 4\' 11"  (1.499 m).   Weight as of this encounter: 62 kg.  Pressure Injury 03/06/21 Coccyx Medial Stage 1 -  Intact skin with non-blanchable redness of a localized area usually over a bony prominence. red area with small amount that does not blanch (Active)  03/06/21 2030  Location: Coccyx  Location Orientation: Medial  Staging: Stage 1 -  Intact skin with non-blanchable redness of a localized area usually over a bony prominence.  Wound Description (Comments): red area with small amount that does not blanch  Present on Admission: Yes    Nutritional Assessment: Body mass index is 27.61 kg/m.Marland Kitchen Seen by dietician.  I agree with the assessment and plan as outlined below: Nutrition Status:   Skin Assessment: I have examined the patient's skin and I agree with the wound assessment as performed by the wound care RN as outlined below: Pressure Injury 03/06/21 Coccyx Medial Stage 1 -  Intact skin with non-blanchable redness of a localized area usually over a bony prominence. red area with small amount that does not blanch (Active)  03/06/21 2030  Location: Coccyx  Location Orientation: Medial  Staging: Stage 1 -  Intact skin with non-blanchable redness of a localized area usually over a bony prominence.  Wound Description  (Comments): red area with small amount that does not blanch  Present on Admission: Yes    Consultants:  None  Procedures:  None  Antimicrobials:  Anti-infectives (From admission, onward)    None         Subjective: Seen and examined.  She is doing well.  She has no complaints.  She is looking forward to discharge to SNF when bed is available.  Objective: Vitals:   04/01/21 2327 04/02/21 0322 04/02/21 0825 04/02/21 1141  BP: (!) 124/56 120/62 122/62 (!) 119/57  Pulse: (!) 106 100 (!) 104 (!) 107  Resp: 16 16 18 16   Temp: 98.4 F (36.9 C) 97.8 F (36.6 C) 98 F (36.7 C) 98.1 F (36.7 C)  TempSrc: Oral Oral Oral Oral  SpO2: 96% 96% 97% 95%  Weight:      Height:        Intake/Output Summary (Last 24 hours) at 04/02/2021 1437 Last data filed at 04/01/2021 1509 Gross per 24 hour  Intake 1931.25 ml  Output --  Net 1931.25 ml    Filed Weights   03/31/21 1231  Weight: 62 kg    Examination:  General exam: Appears calm and comfortable  Respiratory system: Clear to auscultation. Respiratory effort normal. Cardiovascular system: S1 & S2 heard, RRR. No JVD, murmurs, rubs, gallops or clicks. No pedal edema. Gastrointestinal system: Abdomen is nondistended, soft and nontender. No organomegaly or masses felt. Normal bowel sounds heard. Central nervous system: Alert and oriented. No focal neurological deficits. Extremities: Symmetric 5 x 5 power. Skin: No  rashes, lesions or ulcers.  Psychiatry: Judgement and insight appear normal. Mood & affect appropriate.   Data Reviewed: I have personally reviewed following labs and imaging studies  CBC: Recent Labs  Lab 03/31/21 1354 04/01/21 0426  WBC 9.1 5.8  NEUTROABS  --  4.5  HGB 14.7 12.2  HCT 45.7 38.5  MCV 96.0 96.0  PLT 106* 89*    Basic Metabolic Panel: Recent Labs  Lab 03/31/21 1354 04/01/21 0426 04/02/21 0239  NA 142 137 137  K 3.8 3.4* 4.0  CL 107 106 106  CO2 27 24 26   GLUCOSE 148* 176* 78  BUN  20 19 19   CREATININE 0.59 0.63 0.62  CALCIUM 9.0 8.0* 8.6*  MG  --  1.7  --     GFR: Estimated Creatinine Clearance: 47.1 mL/min (by C-G formula based on SCr of 0.62 mg/dL). Liver Function Tests: Recent Labs  Lab 03/31/21 1354 04/01/21 0426  AST 29 30  ALT 75* 57*  ALKPHOS 73 65  BILITOT 1.0 0.7  PROT 5.5* 4.1*  ALBUMIN 2.9* 2.1*    No results for input(s): LIPASE, AMYLASE in the last 168 hours. No results for input(s): AMMONIA in the last 168 hours. Coagulation Profile: Recent Labs  Lab 03/31/21 1354  INR 0.9    Cardiac Enzymes: Recent Labs  Lab 03/31/21 1354  CKTOTAL 63    BNP (last 3 results) No results for input(s): PROBNP in the last 8760 hours. HbA1C: No results for input(s): HGBA1C in the last 72 hours. CBG: Recent Labs  Lab 04/01/21 1239 04/01/21 1754 04/01/21 2059 04/02/21 0604 04/02/21 1135  GLUCAP 197* 189* 340* 113* 170*   Lipid Profile: No results for input(s): CHOL, HDL, LDLCALC, TRIG, CHOLHDL, LDLDIRECT in the last 72 hours. Thyroid Function Tests: No results for input(s): TSH, T4TOTAL, FREET4, T3FREE, THYROIDAB in the last 72 hours. Anemia Panel: No results for input(s): VITAMINB12, FOLATE, FERRITIN, TIBC, IRON, RETICCTPCT in the last 72 hours. Sepsis Labs: No results for input(s): PROCALCITON, LATICACIDVEN in the last 168 hours.  Recent Results (from the past 240 hour(s))  Resp Panel by RT-PCR (Flu A&B, Covid) Nasopharyngeal Swab     Status: None   Collection Time: 03/31/21  1:55 PM   Specimen: Nasopharyngeal Swab; Nasopharyngeal(NP) swabs in vial transport medium  Result Value Ref Range Status   SARS Coronavirus 2 by RT PCR NEGATIVE NEGATIVE Final    Comment: (NOTE) SARS-CoV-2 target nucleic acids are NOT DETECTED.  The SARS-CoV-2 RNA is generally detectable in upper respiratory specimens during the acute phase of infection. The lowest concentration of SARS-CoV-2 viral copies this assay can detect is 138 copies/mL. A negative  result does not preclude SARS-Cov-2 infection and should not be used as the sole basis for treatment or other patient management decisions. A negative result may occur with  improper specimen collection/handling, submission of specimen other than nasopharyngeal swab, presence of viral mutation(s) within the areas targeted by this assay, and inadequate number of viral copies(<138 copies/mL). A negative result must be combined with clinical observations, patient history, and epidemiological information. The expected result is Negative.  Fact Sheet for Patients:  EntrepreneurPulse.com.au  Fact Sheet for Healthcare Providers:  IncredibleEmployment.be  This test is no t yet approved or cleared by the [place] FDA and  has been authorized for detection and/or diagnosis of SARS-CoV-2 by FDA under an Emergency Use Authorization (EUA). This EUA will remain  in effect (meaning this test can be used) for the duration of the COVID-19 declaration under  Section 564(b)(1) of the Act, 21 U.S.C.section 360bbb-3(b)(1), unless the authorization is terminated  or revoked sooner.       Influenza A by PCR NEGATIVE NEGATIVE Final   Influenza B by PCR NEGATIVE NEGATIVE Final    Comment: (NOTE) The Xpert Xpress SARS-CoV-2/FLU/RSV plus assay is intended as an aid in the diagnosis of influenza from Nasopharyngeal swab specimens and should not be used as a sole basis for treatment. Nasal washings and aspirates are unacceptable for Xpert Xpress SARS-CoV-2/FLU/RSV testing.  Fact Sheet for Patients: EntrepreneurPulse.com.au  Fact Sheet for Healthcare Providers: IncredibleEmployment.be  This test is not yet approved or cleared by the [place] FDA and has been authorized for detection and/or diagnosis of SARS-CoV-2 by FDA under an Emergency Use Authorization (EUA). This EUA will remain in effect (meaning this test can be used)  for the duration of the COVID-19 declaration under Section 564(b)(1) of the Act, 21 U.S.C. section 360bbb-3(b)(1), unless the authorization is terminated or revoked.  Performed at [facility], [place] [address], [place], [place] [address]        Radiology Studies: No results found.  Scheduled Meds:  amitriptyline  50 mg Oral QHS   atorvastatin  40 mg Oral Daily   dexamethasone  2 mg Oral Daily   [START ON 04/07/2021] dexamethasone  2 mg Oral QODAY   famotidine  20 mg Oral Daily   insulin aspart  0-15 Units Subcutaneous TID AC & HS   Continuous Infusions:     LOS: 1 day   Time spent: 26 minutes   Darliss Cheney, MD [facility]  04/02/2021, 2:37 PM  Please page via [place] and do not message via secure chat for anything urgent. Secure chat can be used for anything non urgent.  How to contact the [facility] Attending or Consulting provider [place] or covering provider during after hours [place], for this patient?  Check the care team in [facility] and look for a) attending/consulting TRH provider listed and b) the [facility] team listed. Page or secure chat [phone]. Log into [email or web address] and use [place]'s universal password to access. If you do not have the password, please contact the hospital operator. Locate the [facility] provider you are looking for under [facility] and page to a number that you can be directly reached. If you still have difficulty reaching the provider, please page the [facility] (Director on Call) for the Hospitalists listed on amion for assistance.

## 2021-04-02 NOTE — NC FL2 (Signed)
[place] LEVEL OF CARE SCREENING TOOL     IDENTIFICATION  Patient Name: Katelyn Kennedy Birthdate: May 25, 1945 Sex: female Admission Date (Current Location): 03/31/2021  [facility] and [place] Number:  Herbalist and Address:  [facility], [place] [address], [place], [place] [address]      Provider Number: [id number]  Attending Physician Name and Address:  Darliss Cheney, MD  Relative Name and Phone Number:       Current Level of Care: Hospital Recommended Level of Care: [place] Prior Approval Number:    Date Approved/Denied:   PASRR Number: [id number]  Discharge Plan: SNF    Current Diagnoses: Patient Active Problem List   Diagnosis Date Noted   Orthostatic hypotension 04/01/2021   Fall 04/01/2021   Fall at home, initial encounter 03/31/2021   Hypertensive urgency    Pressure injury of skin 03/07/2021   Intraparenchymal hemorrhage of brain ([place]) 03/05/2021   Vasogenic brain edema ([place]) 03/05/2021   Midline shift of brain 03/05/2021   Non-small cell lung cancer metastatic to brain ([place]) 03/05/2021   History of lung cancer 10/15/2020   Atherosclerosis of aorta ([place]) 10/15/2020   Pain in lateral left lower extremity 04/30/2020   Multiple thyroid nodules 04/03/2019   History of bladder cancer 09/30/2018   Encounter for antineoplastic immunotherapy 09/13/2017   Chronic insomnia 08/17/2017   Encounter for antineoplastic chemotherapy 06/17/2017   Goals of care, counseling/discussion 06/17/2017   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 ([place]) 04/29/2017   Counseling regarding end of life decision making 07/31/2014   Eczema 07/20/2011   MICROALBUMINURIA 03/25/2010   Osteoporosis 10/08/2009   Type 2 diabetes mellitus without complication, without long-term current use of insulin ([place]) 09/26/2009   Hyperlipidemia associated with type 2 diabetes mellitus ([place]) 09/03/2009   Allergic rhinitis  09/03/2009   ARTHRITIS 09/03/2009   CHICKENPOX, HX OF 09/03/2009    Orientation RESPIRATION BLADDER Height & Weight     Self, Time, Situation, Place  Normal Continent Weight: 136 lb 11 oz (62 kg) Height:  4\' 11"  (149.9 cm)  BEHAVIORAL SYMPTOMS/MOOD NEUROLOGICAL BOWEL NUTRITION STATUS      Continent Diet (carb modified)  AMBULATORY STATUS COMMUNICATION OF NEEDS Skin   Limited Assist Verbally PU Stage and Appropriate Care PU Stage 1 Dressing:  (coccyx: foam dressing, lift every shift to assess and change PRN)                     Personal Care Assistance Level of Assistance  Bathing, Feeding, Dressing Bathing Assistance: Limited assistance Feeding assistance: Independent Dressing Assistance: Limited assistance     Functional Limitations Info  Sight Sight Info: Impaired        SPECIAL CARE FACTORS FREQUENCY  PT (By licensed PT), OT (By licensed OT)     PT Frequency: 5x/wk OT Frequency: 5x/wk            Contractures Contractures Info: Not present    Additional Factors Info  Code Status, Allergies, Insulin Sliding Scale Code Status Info: DNR Allergies Info: Codeine   Insulin Sliding Scale Info: see DC summary       Current Medications (04/02/2021):  This is the current hospital active medication list Current Facility-Administered Medications  Medication Dose Route Frequency Provider Last Rate Last Admin   acetaminophen (TYLENOL) tablet 650 mg  650 mg Oral Q6H PRN Vernelle Emerald, MD   650 mg at 04/01/21 1029   Or  acetaminophen (TYLENOL) suppository 650 mg  650 mg Rectal Q6H PRN Shalhoub, Sherryll Burger, MD       amitriptyline (ELAVIL) tablet 50 mg  50 mg Oral QHS Vernelle Emerald, MD   50 mg at 04/01/21 2150   atorvastatin (LIPITOR) tablet 40 mg  40 mg Oral Daily Vernelle Emerald, MD   40 mg at 04/02/21 1008   dexamethasone (DECADRON) tablet 2 mg  2 mg Oral Daily Shalhoub, Sherryll Burger, MD   2 mg at 04/02/21 1008   [START ON 04/07/2021] dexamethasone  (DECADRON) tablet 2 mg  2 mg Oral QODAY Shalhoub, Sherryll Burger, MD       famotidine (PEPCID) tablet 20 mg  20 mg Oral Daily Shalhoub, Sherryll Burger, MD   20 mg at 04/02/21 1008   insulin aspart (novoLOG) injection 0-15 Units  0-15 Units Subcutaneous TID AC & HS Vernelle Emerald, MD   11 Units at 04/01/21 2150   ondansetron (ZOFRAN) tablet 4 mg  4 mg Oral Q6H PRN Vernelle Emerald, MD       Or   ondansetron [facility]) injection 4 mg  4 mg Intravenous Q6H PRN Shalhoub, Sherryll Burger, MD       polyethylene glycol (MIRALAX / GLYCOLAX) packet 17 g  17 g Oral Daily PRN Shalhoub, Sherryll Burger, MD         Discharge Medications: Please see discharge summary for a list of discharge medications.  Relevant Imaging Results:  Relevant Lab Results:   Additional Information SS#: [id number]  Geralynn Ochs, LCSW

## 2021-04-02 NOTE — TOC Initial Note (Signed)
Transition of Care [facility]) - Initial/Assessment Note    Patient Details  Name: Katelyn Kennedy MRN: [id number] Date of Birth: October 03, 1945  Transition of [facility]) CM/SW Contact:    Geralynn Ochs, LCSW Phone Number: 04/02/2021, 3:33 PM  Clinical Narrative:        CSW met with patient to discuss recommendation for SNF placement. Patient in agreement, said she would prefer [facility]. CSW answered patient questions. CSW faxed out referral, asked [facility] to review, but they declined. CSW met with patient to update that [facility] declined, and to provide other bed offers. Patient unfamiliar with other options, needs to discuss with her sister and research options. CSW to check back tomorrow with choice. CSW to follow.           Expected Discharge Plan: Skilled Nursing Facility Barriers to Discharge: Continued Medical Work up, Ship broker   Patient Goals and CMS Choice Patient states their goals for this hospitalization and ongoing recovery are:: to get rehab and then get home CMS Medicare.gov Compare Post Acute Care list provided to:: Patient Choice offered to / list presented to : Patient  Expected Discharge Plan and Services Expected Discharge Plan: [place] Choice: [place] Living arrangements for the past 2 months: Apartment                                      Prior Living Arrangements/Services Living arrangements for the past 2 months: Apartment Lives with:: Self Patient language and need for interpreter reviewed:: No Do you feel safe going back to the place where you live?: Yes      Need for Family Participation in Patient Care: No (Comment) Care giver support system in place?: No (comment)   Criminal Activity/Legal Involvement Pertinent to Current Situation/Hospitalization: No - Comment as needed  Activities of Daily Living      Permission Sought/Granted Permission sought to share information  with : Facility Art therapist granted to share information with : Yes, Verbal Permission Granted     Permission granted to share info w AGENCY: SNF        Emotional Assessment Appearance:: Appears stated age Attitude/Demeanor/Rapport: Engaged Affect (typically observed): Appropriate Orientation: : Oriented to Self, Oriented to Place, Oriented to  Time, Oriented to Situation Alcohol / Substance Use: Not Applicable Psych Involvement: No (comment)  Admission diagnosis:  Fall [W19.XXXA] Patient Active Problem List   Diagnosis Date Noted   Orthostatic hypotension 04/01/2021   Fall 04/01/2021   Fall at home, initial encounter 03/31/2021   Hypertensive urgency    Pressure injury of skin 03/07/2021   Intraparenchymal hemorrhage of brain ([place]) 03/05/2021   Vasogenic brain edema ([place]) 03/05/2021   Midline shift of brain 03/05/2021   Non-small cell lung cancer metastatic to brain ([place]) 03/05/2021   History of lung cancer 10/15/2020   Atherosclerosis of aorta ([place]) 10/15/2020   Pain in lateral left lower extremity 04/30/2020   Multiple thyroid nodules 04/03/2019   History of bladder cancer 09/30/2018   Encounter for antineoplastic immunotherapy 09/13/2017   Chronic insomnia 08/17/2017   Encounter for antineoplastic chemotherapy 06/17/2017   Goals of care, counseling/discussion 06/17/2017   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 ([place]) 04/29/2017   Counseling regarding end of life decision making 07/31/2014   Eczema 07/20/2011   MICROALBUMINURIA 03/25/2010   Osteoporosis 10/08/2009   Type  2 diabetes mellitus without complication, without long-term current use of insulin ([place]) 09/26/2009   Hyperlipidemia associated with type 2 diabetes mellitus ([place]) 09/03/2009   Allergic rhinitis 09/03/2009   ARTHRITIS 09/03/2009   CHICKENPOX, HX OF 09/03/2009   PCP:  [name], MD Pharmacy:   [facility] [place], [place] [place]  [address] [place] [address] Phone: [phone] Fax: [phone]     Social Determinants of Health (SDOH) Interventions    Readmission Risk Interventions Readmission Risk Prevention Plan 03/19/2021 11/08/2020  Post Dischage Appt - Complete  Medication Screening - Complete  Transportation Screening Complete Complete  PCP or Specialist Appt within 3-5 Days Complete -  HRI or Home Care Consult Complete -  Social Work Consult for [place] Planning/Counseling Complete -  Palliative Care Screening Not Applicable -  Medication Review Press photographer) Complete -  Some recent data might be hidden

## 2021-04-03 LAB — GLUCOSE, CAPILLARY
Glucose-Capillary: 137 mg/dL — ABNORMAL HIGH (ref 70–99)
Glucose-Capillary: 185 mg/dL — ABNORMAL HIGH (ref 70–99)
Glucose-Capillary: 416 mg/dL — ABNORMAL HIGH (ref 70–99)
Glucose-Capillary: 369 mg/dL — ABNORMAL HIGH (ref 70–99)
Glucose-Capillary: 131 mg/dL — ABNORMAL HIGH (ref 70–99)

## 2021-04-03 LAB — RESP PANEL BY RT-PCR (FLU A&B, COVID) ARPGX2
Influenza A by PCR: NEGATIVE
Influenza B by PCR: NEGATIVE
SARS Coronavirus 2 by RT PCR: NEGATIVE

## 2021-04-03 NOTE — Progress Notes (Signed)
Physical Therapy Treatment Patient Details Name: Katelyn Kennedy MRN: [id number] DOB: Jun 04, 1945 Today's Date: 04/03/2021   History of Present Illness This 75 y.o. female presenting after fall at home. Of note, most recently patient was hospitalized at [facility] long hospital from 11/23 until 12/7 due to worsening left-sided weakness found to have progression of metastatic disease to the brain with new hemorrhagic lesions with associated vasogenic edema and midline shift. 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    PT Comments    Pt is making good progress with mobility, ambulating an increased distance of up to ~40 ft with a RW and min guard assist this date. She did display a L lateral lean sitting EOB initially and a tendency to favor the R side of the RW when ambulating, impacting her balance and safety. She remains at risk for falls due to her deficits in endurance, strength, and balance. Performed exercises to improve her lower extremity strength at end of session. Will continue to follow acutely. Current recommendations remain appropriate.    Recommendations for follow up therapy are one component of a multi-disciplinary discharge planning process, led by the attending physician.  Recommendations may be updated based on patient status, additional functional criteria and insurance authorization.  Follow Up Recommendations  Skilled nursing-short term rehab (<3 hours/day)     Assistance Recommended at Discharge Frequent or constant Supervision/Assistance  Equipment Recommendations  None recommended by PT    Recommendations for Other Services       Precautions / Restrictions Precautions Precautions: Fall Precaution Comments: monitor BP Restrictions Weight Bearing Restrictions: No     Mobility  Bed Mobility Overal bed mobility: Needs  Assistance Bed Mobility: Supine to Sit     Supine to sit: Mod assist;HOB elevated     General bed mobility comments: Cues provided to manage legs off L EOB, using bed pad to scoot hips to edge. ModA to scoot hips and elevate trunk, cuing pt to use bed rail and for hand placement to push up to sit.    Transfers Overall transfer level: Needs assistance Equipment used: Rolling walker (2 wheels) Transfers: Sit to/from Stand Sit to Stand: Min guard           General transfer comment: Pt able to come to stand 1x from EOB and 5x from recliner with extra time, min guard assist for safety. Cues provided for hand placement.    Ambulation/Gait Ambulation/Gait assistance: Min guard Gait Distance (Feet): 40 Feet Assistive device: Rolling walker (2 wheels) Gait Pattern/deviations: Step-through pattern;Decreased stride length;Drifts right/left Gait velocity: decreased Gait velocity interpretation: <1.31 ft/sec, indicative of household ambulator   General Gait Details: Pt tends to push RW distal to her, more so distal from her L side, cues provided to correct with momentary success. Pt drifts towards the R, needing cues to find and maintain midline. Tactile and verbal cues provided to extend trunk and hips for improved posture, maintaining for a short period of time, but pt began to be able to detect without cues the need to correct it. No LOB, min guard for safety.   Stairs             Wheelchair Mobility    Modified Rankin (Stroke Patients Only) Modified Rankin (Stroke Patients Only) Pre-Morbid Rankin Score: Slight disability Modified Rankin: Moderately severe disability     Balance Overall balance assessment: Needs assistance Sitting-balance support: Feet  supported;Bilateral upper extremity supported;No upper extremity supported Sitting balance-Leahy Scale: Fair Sitting balance - Comments: Pt requiring min-modA initially to sit EOB due to lean to L, but once feet were  supported and bed more flat she progressed to min guard Postural control: Left lateral lean Standing balance support: Bilateral upper extremity supported;During functional activity;No upper extremity supported Standing balance-Leahy Scale: Fair Standing balance comment: Briefly stands statically without UE support, relies on RW for mobility.                            Cognition Arousal/Alertness: Awake/alert Behavior During Therapy: WFL for tasks assessed/performed Overall Cognitive Status: Impaired/Different from baseline                       Memory: Decreased short-term memory         General Comments: Pt with moments in which she repeated herself, possible STM deficits noted.        Exercises General Exercises - Lower Extremity Long Arc Quad: Strengthening;AROM;Both;10 reps;Seated (with eccentric control) Hip Flexion/Marching: Strengthening;Both;10 reps;AROM;Seated Other Exercises Other Exercises: sit <> stand 5x from recliner using UEs    General Comments        Pertinent Vitals/Pain Pain Assessment: No/denies pain    Home Living                          Prior Function            PT Goals (current goals can now be found in the care plan section) Acute Rehab PT Goals Patient Stated Goal: to get OOB and go to rehab PT Goal Formulation: With patient Time For Goal Achievement: 04/14/21 Potential to Achieve Goals: Good Progress towards PT goals: Progressing toward goals    Frequency    Min 2X/week      PT Plan Current plan remains appropriate    Co-evaluation              AM-PAC PT "6 Clicks" Mobility   Outcome Measure  Help needed turning from your back to your side while in a flat bed without using bedrails?: A Little Help needed moving from lying on your back to sitting on the side of a flat bed without using bedrails?: A Lot Help needed moving to and from a bed to a chair (including a wheelchair)?: A  Little Help needed standing up from a chair using your arms (e.g., wheelchair or bedside chair)?: A Little Help needed to walk in hospital room?: A Little Help needed climbing 3-5 steps with a railing? : Total 6 Click Score: 15    End of Session Equipment Utilized During Treatment: Gait belt Activity Tolerance: Patient tolerated treatment well Patient left: with call bell/phone within reach;in chair;with chair alarm set Nurse Communication: Mobility status PT Visit Diagnosis: Unsteadiness on feet (R26.81);Muscle weakness (generalized) (M62.81);Difficulty in walking, not elsewhere classified (R26.2);Other abnormalities of gait and mobility (R26.89);Other symptoms and signs involving the nervous system (R29.898)     Time: 0354-6568 PT Time Calculation (min) (ACUTE ONLY): 26 min  Charges:  $Gait Training: 8-22 mins $Therapeutic Exercise: 8-22 mins                     Moishe Spice, PT, DPT Acute Rehabilitation Services  Pager: [phone] Office: [place] 04/03/2021, 3:03 PM

## 2021-04-03 NOTE — Progress Notes (Signed)
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   Of note, most recently patient was hospitalized at [facility] long hospital from 11/23 until 12/7 due to worsening left-sided weakness found to have progression of metastatic disease to the brain with new hemorrhagic lesions with associated vasogenic edema and midline shift.  Patient was hospitalized with neurosurgery consultation.  Patient was treated with 1 week of prophylactic Keppra as well as initiation of steroid therapy.  Radiation oncology was also consulted and patient was initiated whole brain radiation therapy with completion on date of discharge which was 12/7.   Patient explains that early this morning at approximately 3 AM she was attempting to walk to the bathroom using her walker which she states rolled out from under her resulting in a fall.  Patient denies any associated loss of consciousness or new focal weakness.  Due to patient's weakness she was unable to get up for the remainder of the morning.  Patient denies any associated slurred speech or visual changes.  Patient reports striking her head on a basket that was beside her when she fell.  Patient denies any additional symptoms such as fevers, sick contacts, cough, shortness of breath abdominal pain or dysuria.   Patient's neighbor came by to check on her this morning and found her down contacting EMS who promptly came to evaluate the patient bring her into [facility] emergency department for evaluation.   Upon evaluation in the  emergency department CT imaging of the head redemonstrated multifocal intracranial metastatic disease revealing stable or decreased hemorrhages with decreasing vasogenic edema and reduced leftward midline shift of 3 mm.  Urinalysis revealed no evidence of infection.  Chest x-ray revealed no evidence of cardiopulmonary disease.  Patient was administered a 500 cc LR bolus.  Due to concerns for patient's safety and ongoing weakness the hospitalist group was then called to assess the patient for admission to the hospital.  Assessment & Plan:   Principal Problem:   Fall at home, initial encounter Active Problems:   Type 2 diabetes mellitus without complication, without long-term current use of insulin ([place])   Hyperlipidemia associated with type 2 diabetes mellitus ([place])   Intraparenchymal hemorrhage of brain ([place])   Vasogenic brain edema (H[facility])   Non-small cell lung cancer metastatic to brain ([place])   Orthostatic hypotension   Fall  Fall at home generalized weakness/orthostatic hypotension: CT imaging of the brain without contrast reveals improving vasogenic edema and midline shift without new areas of disease. No loss of consciousness or any symptoms prior to episode.  This is purely secondary generalized weakness due to her spreading cancer with some contribution from orthostatic hypotension, continue IV fluids.  PT OT recommended SNF.  Patient chose asked in place but they declined her.  Multiple referrals has been sent for the patient.  TOC on board for bed placement.   Metastatic primary lung cancer/vasogenic brain edema/intraparenchymal hemorrhage of brain: Evidence of improving edema and hemorrhages on review of CT imaging of the head with Dr. Lorrin Goodell with neurology. Per admitting hospitalist's discussion with neurology, no indication for repeat MRI imaging of the brain at  this time unless patient exhibits worsening lethargy or new neurologic deficits. Continuing tapering regimen of Decadron per  neurosurgery recommendations during previous hospitalization. Performing serial neurologic checks   Type 2 diabetes mellitus without complication, without long-term current use of insulin ([place]): Hemoglobin A1c only 4.9 just 3 weeks ago, currently hyperglycemic, likely secondary to being on steroids, continue SSI.   Hyperlipidemia: Continue statin  Hypokalemia: Resolved.  DVT prophylaxis: SCDs Start: 04/01/21 0012   Code Status: DNR  Family Communication:  None present at bedside.  Plan of care discussed with patient in length and he verbalized understanding and agreed with it.  Status is: Inpatient  Remains inpatient appropriate because: Awaiting SNF placement.  Estimated body mass index is 27.61 kg/m as calculated from the following:   Height as of this encounter: 4\' 11"  (1.499 m).   Weight as of this encounter: 62 kg.  Pressure Injury 03/06/21 Coccyx Medial Stage 1 -  Intact skin with non-blanchable redness of a localized area usually over a bony prominence. red area with small amount that does not blanch (Active)  03/06/21 2030  Location: Coccyx  Location Orientation: Medial  Staging: Stage 1 -  Intact skin with non-blanchable redness of a localized area usually over a bony prominence.  Wound Description (Comments): red area with small amount that does not blanch  Present on Admission: Yes    Nutritional Assessment: Body mass index is 27.61 kg/m.Marland Kitchen Seen by dietician.  I agree with the assessment and plan as outlined below: Nutrition Status:   Skin Assessment: I have examined the patient's skin and I agree with the wound assessment as performed by the wound care RN as outlined below: Pressure Injury 03/06/21 Coccyx Medial Stage 1 -  Intact skin with non-blanchable redness of a localized area usually over a bony prominence. red area with small amount that does not blanch (Active)  03/06/21 2030  Location: Coccyx  Location Orientation: Medial  Staging: Stage 1 -  Intact skin  with non-blanchable redness of a localized area usually over a bony prominence.  Wound Description (Comments): red area with small amount that does not blanch  Present on Admission: Yes    Consultants:  None  Procedures:  None  Antimicrobials:  Anti-infectives (From admission, onward)    None         Subjective:  Patient seen and examined.  She is fully alert and oriented and she has no complaints.  Objective: Vitals:   04/02/21 2333 04/03/21 0323 04/03/21 0808 04/03/21 1154  BP: (!) 116/56 108/60 (!) 105/57 (!) 101/51  Pulse: (!) 106 (!) 101 100 (!) 106  Resp: 19 16 16 18   Temp: 98.5 F (36.9 C) 98.4 F (36.9 C) 98.6 F (37 C) 98 F (36.7 C)  TempSrc: Oral Oral Oral Oral  SpO2: 95% 97% 95% 95%  Weight:      Height:       No intake or output data in the 24 hours ending 04/03/21 1234  Filed Weights   03/31/21 1231  Weight: 62 kg    Examination:  General exam: Appears calm and comfortable  Respiratory system: Clear to auscultation. Respiratory effort normal. Cardiovascular system: S1 & S2 heard, RRR. No JVD, murmurs, rubs, gallops or clicks. No pedal edema. Gastrointestinal system: Abdomen is nondistended, soft and nontender. No organomegaly or masses felt. Normal bowel sounds heard. Central nervous system: Alert and oriented. No focal neurological deficits. Extremities: Symmetric 5 x 5 power. Skin: No rashes, lesions or ulcers.  Psychiatry: Judgement and insight  appear normal. Mood & affect appropriate.   Data Reviewed: I have personally reviewed following labs and imaging studies  CBC: Recent Labs  Lab 03/31/21 1354 04/01/21 0426  WBC 9.1 5.8  NEUTROABS  --  4.5  HGB 14.7 12.2  HCT 45.7 38.5  MCV 96.0 96.0  PLT 106* 89*    Basic Metabolic Panel: Recent Labs  Lab 03/31/21 1354 04/01/21 0426 04/02/21 0239  NA 142 137 137  K 3.8 3.4* 4.0  CL 107 106 106  CO2 27 24 26   GLUCOSE 148* 176* 78  BUN 20 19 19   CREATININE 0.59 0.63 0.62   CALCIUM 9.0 8.0* 8.6*  MG  --  1.7  --     GFR: Estimated Creatinine Clearance: 47.1 mL/min (by C-G formula based on SCr of 0.62 mg/dL). Liver Function Tests: Recent Labs  Lab 03/31/21 1354 04/01/21 0426  AST 29 30  ALT 75* 57*  ALKPHOS 73 65  BILITOT 1.0 0.7  PROT 5.5* 4.1*  ALBUMIN 2.9* 2.1*    No results for input(s): LIPASE, AMYLASE in the last 168 hours. No results for input(s): AMMONIA in the last 168 hours. Coagulation Profile: Recent Labs  Lab 03/31/21 1354  INR 0.9    Cardiac Enzymes: Recent Labs  Lab 03/31/21 1354  CKTOTAL 63    BNP (last 3 results) No results for input(s): PROBNP in the last 8760 hours. HbA1C: No results for input(s): HGBA1C in the last 72 hours. CBG: Recent Labs  Lab 04/02/21 1135 04/02/21 1631 04/02/21 2141 04/03/21 0611 04/03/21 1158  GLUCAP 170* 261* 126* 137* 185*    Lipid Profile: No results for input(s): CHOL, HDL, LDLCALC, TRIG, CHOLHDL, LDLDIRECT in the last 72 hours. Thyroid Function Tests: No results for input(s): TSH, T4TOTAL, FREET4, T3FREE, THYROIDAB in the last 72 hours. Anemia Panel: No results for input(s): VITAMINB12, FOLATE, FERRITIN, TIBC, IRON, RETICCTPCT in the last 72 hours. Sepsis Labs: No results for input(s): PROCALCITON, LATICACIDVEN in the last 168 hours.  Recent Results (from the past 240 hour(s))  Resp Panel by RT-PCR (Flu A&B, Covid) Nasopharyngeal Swab     Status: None   Collection Time: 03/31/21  1:55 PM   Specimen: Nasopharyngeal Swab; Nasopharyngeal(NP) swabs in vial transport medium  Result Value Ref Range Status   SARS Coronavirus 2 by RT PCR NEGATIVE NEGATIVE Final    Comment: (NOTE) SARS-CoV-2 target nucleic acids are NOT DETECTED.  The SARS-CoV-2 RNA is generally detectable in upper respiratory specimens during the acute phase of infection. The lowest concentration of SARS-CoV-2 viral copies this assay can detect is 138 copies/mL. A negative result does not preclude  SARS-Cov-2 infection and should not be used as the sole basis for treatment or other patient management decisions. A negative result may occur with  improper specimen collection/handling, submission of specimen other than nasopharyngeal swab, presence of viral mutation(s) within the areas targeted by this assay, and inadequate number of viral copies(<138 copies/mL). A negative result must be combined with clinical observations, patient history, and epidemiological information. The expected result is Negative.  Fact Sheet for Patients:  EntrepreneurPulse.com.au  Fact Sheet for Healthcare Providers:  IncredibleEmployment.be  This test is no t yet approved or cleared by the [place] FDA and  has been authorized for detection and/or diagnosis of SARS-CoV-2 by FDA under an Emergency Use Authorization (EUA). This EUA will remain  in effect (meaning this test can be used) for the duration of the COVID-19 declaration under Section 564(b)(1) of the Act, 21 U.S.C.section 360bbb-3(b)(1),  unless the authorization is terminated  or revoked sooner.       Influenza A by PCR NEGATIVE NEGATIVE Final   Influenza B by PCR NEGATIVE NEGATIVE Final    Comment: (NOTE) The Xpert Xpress SARS-CoV-2/FLU/RSV plus assay is intended as an aid in the diagnosis of influenza from Nasopharyngeal swab specimens and should not be used as a sole basis for treatment. Nasal washings and aspirates are unacceptable for Xpert Xpress SARS-CoV-2/FLU/RSV testing.  Fact Sheet for Patients: EntrepreneurPulse.com.au  Fact Sheet for Healthcare Providers: IncredibleEmployment.be  This test is not yet approved or cleared by the [place] FDA and has been authorized for detection and/or diagnosis of SARS-CoV-2 by FDA under an Emergency Use Authorization (EUA). This EUA will remain in effect (meaning this test can be used) for the duration of  the COVID-19 declaration under Section 564(b)(1) of the Act, 21 U.S.C. section 360bbb-3(b)(1), unless the authorization is terminated or revoked.  Performed at [facility], [place] [address], [place], [place] [address]        Radiology Studies: No results found.  Scheduled Meds:  amitriptyline  50 mg Oral QHS   atorvastatin  40 mg Oral Daily   dexamethasone  2 mg Oral Daily   [START ON 04/07/2021] dexamethasone  2 mg Oral QODAY   famotidine  20 mg Oral Daily   insulin aspart  0-15 Units Subcutaneous TID AC & HS   Continuous Infusions:     LOS: 2 days   Time spent: 24 minutes   Darliss Cheney, MD [facility]  04/03/2021, 12:34 PM  Please page via [place] and do not message via secure chat for anything urgent. Secure chat can be used for anything non urgent.  How to contact the [facility] Attending or Consulting provider [place] or covering provider during after hours [place], for this patient?  Check the care team in [facility] and look for a) attending/consulting TRH provider listed and b) the [facility] team listed. Page or secure chat [phone]. Log into [email or web address] and use [place]'s universal password to access. If you do not have the password, please contact the hospital operator. Locate the [facility] provider you are looking for under [facility] and page to a number that you can be directly reached. If you still have difficulty reaching the provider, please page the [facility] (Director on Call) for the Hospitalists listed on amion for assistance.

## 2021-04-03 NOTE — Plan of Care (Signed)
°  Problem: Clinical Measurements: Goal: Ability to maintain clinical measurements within normal limits will improve Outcome: Progressing Goal: Will remain free from infection Outcome: Progressing Goal: Diagnostic test results will improve Outcome: Progressing Goal: Respiratory complications will improve Outcome: Progressing Goal: Cardiovascular complication will be avoided Outcome: Progressing   Problem: Safety: Goal: Ability to remain free from injury will improve Outcome: Progressing

## 2021-04-03 NOTE — TOC Progression Note (Signed)
Transition of Care [facility]) - Progression Note    Patient Details  Name: Katelyn Kennedy MRN: [id number] Date of Birth: 08/22/1945  Transition of Care [facility]) CM/SW [place], [place] Phone Number: 04/03/2021, 3:34 PM  Clinical Narrative:   CSW met with patient earlier today to discuss bed offers. Patient said she'd be interested in [facility], and that was really the only other SNF that she knew anything about. CSW contacted [facility], but they do not have any beds available. CSW received a call from patient's sister, [name], who said patient is struggling with the decision of SNF, if CSW can further assist that would be helpful. CSW met with patient again and reviewed more information about the preferred SNF options. After more discussion, patient interested in [facility] as first choice with [facility] as second. CSW contacted [facility], they have no beds available, but [facility] can admit. CSW contacted [employer] to initiate insurance authorization process. CSW to follow.    Expected Discharge Plan: Skilled Nursing Facility Barriers to Discharge: Continued Medical Work up, Ship broker  Expected Discharge Plan and Services Expected Discharge Plan: [place] Choice: [place] Living arrangements for the past 2 months: Apartment                                       Social Determinants of Health (SDOH) Interventions    Readmission Risk Interventions Readmission Risk Prevention Plan 03/19/2021 11/08/2020  Post Dischage Appt - Complete  Medication Screening - Complete  Transportation Screening Complete Complete  PCP or Specialist Appt within 3-5 Days Complete -  HRI or Home Care Consult Complete -  Social Work Consult for [place] Planning/Counseling Complete -  Palliative Care Screening Not Applicable -  Medication Review Press photographer) Complete -  Some recent data  might be hidden

## 2021-04-04 DIAGNOSIS — J309 Allergic rhinitis, unspecified: Secondary | ICD-10-CM | POA: Diagnosis not present

## 2021-04-04 DIAGNOSIS — Z1159 Encounter for screening for other viral diseases: Secondary | ICD-10-CM | POA: Diagnosis not present

## 2021-04-04 DIAGNOSIS — C7931 Secondary malignant neoplasm of brain: Secondary | ICD-10-CM | POA: Diagnosis not present

## 2021-04-04 DIAGNOSIS — M5136 Other intervertebral disc degeneration, lumbar region: Secondary | ICD-10-CM | POA: Diagnosis not present

## 2021-04-04 DIAGNOSIS — F5104 Psychophysiologic insomnia: Secondary | ICD-10-CM | POA: Diagnosis not present

## 2021-04-04 DIAGNOSIS — W19XXXD Unspecified fall, subsequent encounter: Secondary | ICD-10-CM | POA: Diagnosis not present

## 2021-04-04 DIAGNOSIS — I700 Atherosclerosis of aorta: Secondary | ICD-10-CM | POA: Diagnosis not present

## 2021-04-04 DIAGNOSIS — C3490 Malignant neoplasm of unspecified part of unspecified bronchus or lung: Secondary | ICD-10-CM | POA: Diagnosis not present

## 2021-04-04 DIAGNOSIS — R41841 Cognitive communication deficit: Secondary | ICD-10-CM | POA: Diagnosis not present

## 2021-04-04 DIAGNOSIS — M1611 Unilateral primary osteoarthritis, right hip: Secondary | ICD-10-CM | POA: Diagnosis not present

## 2021-04-04 DIAGNOSIS — I619 Nontraumatic intracerebral hemorrhage, unspecified: Secondary | ICD-10-CM | POA: Diagnosis not present

## 2021-04-04 DIAGNOSIS — E876 Hypokalemia: Secondary | ICD-10-CM | POA: Diagnosis not present

## 2021-04-04 DIAGNOSIS — E785 Hyperlipidemia, unspecified: Secondary | ICD-10-CM | POA: Diagnosis not present

## 2021-04-04 DIAGNOSIS — E042 Nontoxic multinodular goiter: Secondary | ICD-10-CM | POA: Diagnosis not present

## 2021-04-04 DIAGNOSIS — I739 Peripheral vascular disease, unspecified: Secondary | ICD-10-CM | POA: Diagnosis not present

## 2021-04-04 DIAGNOSIS — Z86718 Personal history of other venous thrombosis and embolism: Secondary | ICD-10-CM | POA: Diagnosis not present

## 2021-04-04 DIAGNOSIS — G936 Cerebral edema: Secondary | ICD-10-CM | POA: Diagnosis not present

## 2021-04-04 DIAGNOSIS — M5030 Other cervical disc degeneration, unspecified cervical region: Secondary | ICD-10-CM | POA: Diagnosis not present

## 2021-04-04 DIAGNOSIS — M6281 Muscle weakness (generalized): Secondary | ICD-10-CM | POA: Diagnosis not present

## 2021-04-04 DIAGNOSIS — I951 Orthostatic hypotension: Secondary | ICD-10-CM | POA: Diagnosis not present

## 2021-04-04 DIAGNOSIS — S065X0A Traumatic subdural hemorrhage without loss of consciousness, initial encounter: Secondary | ICD-10-CM | POA: Diagnosis not present

## 2021-04-04 DIAGNOSIS — I10 Essential (primary) hypertension: Secondary | ICD-10-CM | POA: Diagnosis not present

## 2021-04-04 DIAGNOSIS — Y92009 Unspecified place in unspecified non-institutional (private) residence as the place of occurrence of the external cause: Secondary | ICD-10-CM | POA: Diagnosis not present

## 2021-04-04 DIAGNOSIS — M810 Age-related osteoporosis without current pathological fracture: Secondary | ICD-10-CM | POA: Diagnosis not present

## 2021-04-04 DIAGNOSIS — W19XXXA Unspecified fall, initial encounter: Secondary | ICD-10-CM | POA: Diagnosis not present

## 2021-04-04 DIAGNOSIS — E1165 Type 2 diabetes mellitus with hyperglycemia: Secondary | ICD-10-CM | POA: Diagnosis not present

## 2021-04-04 DIAGNOSIS — E46 Unspecified protein-calorie malnutrition: Secondary | ICD-10-CM | POA: Diagnosis not present

## 2021-04-04 DIAGNOSIS — E1169 Type 2 diabetes mellitus with other specified complication: Secondary | ICD-10-CM | POA: Diagnosis not present

## 2021-04-04 DIAGNOSIS — R531 Weakness: Secondary | ICD-10-CM | POA: Diagnosis not present

## 2021-04-04 LAB — GLUCOSE, CAPILLARY
Glucose-Capillary: 176 mg/dL — ABNORMAL HIGH (ref 70–99)
Glucose-Capillary: 172 mg/dL — ABNORMAL HIGH (ref 70–99)

## 2021-04-04 MED ORDER — DEXAMETHASONE 2 MG PO TABS: 2.0000 mg | ORAL_TABLET | Freq: Every day | ORAL | 0 refills | Status: AC

## 2021-04-04 MED ORDER — DEXAMETHASONE 2 MG PO TABS: 2.0000 mg | ORAL_TABLET | ORAL | 0 refills | Status: AC

## 2021-04-04 NOTE — Care Management Important Message (Signed)
Important Message  Patient Details  Name: Katelyn Kennedy MRN: [id number] Date of Birth: 11-18-1945   Medicare Important Message Given:  Yes     Orbie Pyo 04/04/2021, 3:19 PM

## 2021-04-04 NOTE — Progress Notes (Signed)
Katelyn Kennedy  D/C'd to Skilled nursing facility per MD order.  Discussed with the patient and all questions fully answered. Call placed to [facility], and report given to [place].  VSS, Skin clean, dry and intact without evidence of skin break down, no evidence of skin tears noted. IV catheter discontinued intact. Site without signs and symptoms of complications. Dressing and pressure applied.  An After Visit Summary was printed and given to the patient.  D/c education completed with patient/family including follow up instructions, medication list, d/c activities limitations if indicated, with other d/c instructions as indicated by MD - patient able to verbalize understanding, all questions fully answered.   Patient instructed to return to ED, call 911, or call MD for any changes in condition.   Patient escorted via [place], and D/C home via private auto.  [name] 04/04/2021 4:40 PM

## 2021-04-04 NOTE — Discharge Summary (Signed)
Physician Discharge Summary  PRAGYA LOFASO LGX:[id number] DOB: 1945-08-29 DOA: 03/31/2021  PCP: [name], MD  Admit date: 03/31/2021 Discharge date: 04/04/2021 30 Day Unplanned Readmission Risk Score    Flowsheet Row ED to Hosp-Admission (Current) from 03/31/2021 in [facility]  30 Day Unplanned Readmission Risk Score (%) 22.92 Filed at 04/04/2021 0801       This score is the patient's risk of an unplanned readmission within 30 days of being discharged (0 -100%). The score is based on dignosis, age, lab data, medications, orders, and past utilization.   Low:  0-14.9   Medium: 15-21.9   High: 22-29.9   Extreme: 30 and above          Admitted From: Home Disposition: SNF  Recommendations for Outpatient Follow-up:  Follow up with PCP in 1-2 weeks Please obtain BMP/CBC in one week Please follow up with your PCP on the following pending results: Unresulted Labs (From admission, onward)    None         Home Health: None Equipment/Devices: None  Discharge Condition: Stable CODE STATUS: DNR Diet recommendation: Cardiac  Subjective: Seen and examined.  No complaints.  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   Of note, most recently patient was hospitalized at [facility] long hospital from 11/23 until 12/7 due to worsening left-sided weakness found to have progression of metastatic disease to the brain with new hemorrhagic lesions with associated vasogenic edema and midline shift.  Patient was hospitalized with neurosurgery consultation.  Patient was treated with 1 week of prophylactic Keppra as well as initiation  of steroid therapy.  Radiation oncology was also consulted and patient was initiated whole brain radiation therapy with completion on date of discharge which was 12/7.   Patient explained that early this morning at approximately 3 AM she was attempting to walk to the bathroom using her walker which she states rolled out from under her resulting in a fall.  Patient denied any associated loss of consciousness or new focal weakness.  Due to patient's weakness she was unable to get up for the remainder of the morning.  Patient denies any associated slurred speech or visual changes.  Patient reports striking her head on a basket that was beside her when she fell.  Patient denied any additional symptoms such as fevers, sick contacts, cough, shortness of breath abdominal pain or dysuria. Patient's neighbor came by to check on her this morning and found her down contacting EMS who promptly came to evaluate the patient bring her into [facility] emergency department for evaluation.   Upon evaluation in the emergency department CT imaging of the head redemonstrated multifocal intracranial metastatic disease revealing stable or decreased hemorrhages with decreasing vasogenic edema and reduced leftward midline shift of 3 mm.  Urinalysis revealed no evidence of infection.  Chest x-ray revealed no evidence of cardiopulmonary disease.  Patient was administered a 500 cc LR bolus.  Due to concerns for patient's safety and ongoing weakness the hospitalist group was then called to assess the patient for admission to the hospital.  She was seen by PT OT who recommended SNF appropriately.  Initial insurance authorization was denied, I did peer to peer with medical director Dr. Amalia Hailey and provided  him the facts and insight about patient's weakness and that patient has great chance of recovery if we provide her the opportunity to go to SNF.  After that long peer-to-peer discussion, patient was approved for SNF.  Patient is now  being discharged in stable condition.   Metastatic primary lung cancer/vasogenic brain edema/intraparenchymal hemorrhage of brain: Evidence of improving edema and hemorrhages on review of CT imaging of the head with Dr. Lorrin Goodell with neurology. Per admitting hospitalist's discussion with neurology, no indication for repeat MRI imaging of the brain at this time unless patient exhibits worsening lethargy or new neurologic deficits. Continuing tapering regimen of Decadron per neurosurgery recommendations during previous hospitalization. Performing serial neurologic checks   Type 2 diabetes mellitus without complication, without long-term current use of insulin ([place]): Hemoglobin A1c only 4.9 just 3 weeks ago, currently hyperglycemic, likely secondary to being on steroids, continue SSI.   Hyperlipidemia: Continue statin   Hypokalemia: Resolved.  Discharge Diagnoses:  Principal Problem:   Fall at home, initial encounter Active Problems:   Type 2 diabetes mellitus without complication, without long-term current use of insulin (HCC)   Hyperlipidemia associated with type 2 diabetes mellitus ([place])   Intraparenchymal hemorrhage of brain (HCC)   Vasogenic brain edema (HCC)   Non-small cell lung cancer metastatic to brain ([place])   Orthostatic hypotension   Fall    Discharge Instructions   Allergies as of 04/04/2021       Reactions   Codeine Nausea Only        Medication List     TAKE these medications    amitriptyline 50 MG tablet Commonly known as: ELAVIL Take 1 tablet (50 mg total) by mouth at bedtime.   atorvastatin 40 MG tablet Commonly known as: LIPITOR 1 tablet  po daily What changed:  how much to take how to take this when to take this additional instructions   cholecalciferol 25 MCG (1000 UNIT) tablet Commonly known as: VITAMIN D Take 1,000 Units by mouth daily.   CITRACAL + D PO Take 1 tablet by mouth daily.   dexamethasone 2 MG tablet Commonly known as:  DECADRON Take 1 tablet (2 mg total) by mouth daily for 2 days. What changed:  how much to take how to take this when to take this additional instructions   dexamethasone 2 MG tablet Commonly known as: DECADRON Take 1 tablet (2 mg total) by mouth every other day for 6 days. Start taking on: April 07, 2021 What changed: You were already taking a medication with the same name, and this prescription was added. Make sure you understand how and when to take each.   famotidine 20 MG tablet Commonly known as: PEPCID Take 1 tablet (20 mg total) by mouth daily.   multivitamin capsule Take 1 capsule by mouth daily. Centrum Silver   polyethylene glycol 17 g packet Commonly known as: MIRALAX / GLYCOLAX Take 17 g by mouth daily as needed for moderate constipation.   vitamin B-12 1000 MCG tablet Commonly known as: CYANOCOBALAMIN Take 1,000 mcg by mouth daily.        Follow-up Information     Bedsole, Amy E, MD Follow up in 1 week(s).   Specialty: Family Medicine Contact information: [place] [id number] [phone]                Allergies  Allergen Reactions   Codeine Nausea Only    Consultations: Neurology   Procedures/Studies: CT HEAD WO CONTRAST  Result Date:  03/31/2021 CLINICAL DATA:  Provided history: Head trauma, minor. Additional history provided: Mechanical fall last night. EXAM: CT HEAD WITHOUT CONTRAST TECHNIQUE: Contiguous axial images were obtained from the base of the skull through the vertex without intravenous contrast. COMPARISON:  Brain MRI 03/06/2021.  Head CT 03/05/2021. FINDINGS: Brain: Cerebral volume is normal. Redemonstrated sequela of prior right parietal craniotomy and postsurgical changes within the underlying posterior right frontal lobe. The patient has multiple known hemorrhagic parenchymal metastases within the bilateral frontal lobes, as well as fairly extensive hemorrhagic dural-based metastatic disease along  the falx and overlying the right cerebral hemisphere. Multifocal hemorrhage associated with the metastatic lesions has decreased or remained stable since the prior head CT of 03/05/2021. Similarly, sites of vasogenic edema in the right frontal lobe have remained stable or decreased as compared to the prior examination. Most notably, there is a persistent focus of moderate vasogenic edema within the anterior right frontal lobe and callosal genu. Small foci of edema within the right parietooccipital lobes have not significantly changed. Within the limitations of a noncontrast head CT, no new sites of intracranial metastatic disease are identified. Mass effect has decreased. There is now 3 mm leftward midline shift measured at the level of the septum pellucidum (previously 8 mm). No demarcated cortical infarct. Vascular: Maintained flow voids within the proximal large arterial vessels. Atherosclerotic calcifications. Skull: Right parietal cranioplasty. No calvarial fracture. No focal suspicious osseous lesion. Sinuses/Orbits: Visualized orbits show no acute finding. No significant paranasal sinus disease. IMPRESSION: Known multifocal intracranial metastatic disease with both parenchymal and dural-based lesions. Hemorrhage associated with the intracranial metastases has remained stable or decreased when compared with the prior head CT of 03/05/2021. Similarly, multifocal vasogenic edema associated with these lesions has decreased or remained stable. Most notably, there is a focus of moderate residual vasogenic edema within the anterior right frontal lobe. Mass effect has decreased, now with 3 mm leftward midline shift (previously 8 mm). Within the limitations of a noncontrast head CT, no new intracranial metastasis is identified. Redemonstrated sequela of prior right parietal calvarium and postsurgical changes in the underlying posterior right frontal lobe. Electronically Signed   By: Kellie Simmering D.O.   On: 03/31/2021  15:15   CT HEAD WO CONTRAST (5MM)  Result Date: 03/05/2021 CLINICAL DATA:  Metastatic right lung cancer, brain metastases EXAM: CT HEAD WITHOUT CONTRAST TECHNIQUE: Contiguous axial images were obtained from the base of the skull through the vertex without intravenous contrast. COMPARISON:  12/24/2020, 11/27/2020 FINDINGS: Brain: Postsurgical changes are again noted from right frontoparietal craniotomy. Suspected recurrent metastatic disease along the right frontal convexity at the gray-white interface measuring up to 1.1 x 0.8 cm reference image 25/3. Mild surrounding vasogenic edema. Multiple new hemorrhagic foci are seen throughout the left frontal, right frontal, right temporal, right parietal regions consistent with progressive metastatic lung cancer. Hemorrhagic metastatic deposits in the right frontal lobe measure up to 1.8 x 1.0 cm reference image 15/3. Left frontal hemorrhagic focus measures up to 1.3 x 0.8 cm. Significant surrounding vasogenic edema. There is leftward midline shift, measuring up to 8 mm at the level of the septum pellucidum. Smaller hemorrhagic foci within the right frontotemporal region image 20/3 measures 8 mm, and in the right temporoparietal region image 16 measures 0.9 cm. On the reconstructed images, multiple areas of high attenuation are seen within the dura, deep to the craniotomy site as well as along the right frontal convexity, concerning for dural spread of disease. Vascular: No hyperdense vessel or unexpected calcification.  Skull: Postsurgical changes from right frontal parietal craniotomy. No acute fractures. Sinuses/Orbits: No acute finding. Other: None. IMPRESSION: 1. Marked progression of metastatic lung cancer, with multiple new hemorrhagic metastatic lesions as above. Significant right frontal vasogenic edema results in leftward midline shift measuring approximately 0.8 cm. Critical Value/emergent results were called by telephone at the time of interpretation on  03/05/2021 at 5:37 pm to provider Regan Lemming , who verbally acknowledged these results. Electronically Signed   By: Randa Ngo M.D.   On: 03/05/2021 17:37   CT CERVICAL SPINE WO CONTRAST  Result Date: 03/31/2021 CLINICAL DATA:  Neck trauma (Age >= 65y) EXAM: CT CERVICAL SPINE WITHOUT CONTRAST TECHNIQUE: Multidetector CT imaging of the cervical spine was performed without intravenous contrast. Multiplanar CT image reconstructions were also generated. COMPARISON:  None. FINDINGS: Alignment: Mild reversal of the normal cervical lordosis. Slight anterolisthesis of C3 on C4, favor degenerative given facet arthropathy at this level. Skull base and vertebrae: Vertebral body heights are maintained. No evidence of acute fracture. Bony demineralization Soft tissues and spinal canal: No prevertebral fluid or swelling. No visible canal hematoma. Disc levels: Moderate degenerative disease at C5-C6 and C6-C7 where there is disc height loss, endplate sclerosis and posterior disc osteophyte complex. Multilevel facet arthropathy. Upper chest: Similar size of an 8 mm solid/subsolid pulmonary nodule in the right upper lobe, better characterized on prior CT chest from 11/11/2020. Other: Bilateral thyroid nodules, better characterized on ultrasound of the thyroid from 04/26/2019 (ref: J Am Coll Radiol. 2015 Feb;12(2): 143-50). IMPRESSION: 1. No evidence of acute fracture or traumatic malalignment. 2. Multilevel degenerative change, detailed above. 3. Similar size of an 8 mm solid/subsolid pulmonary nodule in the right upper lobe, better characterized on prior CT chest. Recommend continued attention on follow-up imaging. Electronically Signed   By: Margaretha Sheffield M.D.   On: 03/31/2021 15:00   MR BRAIN W WO CONTRAST  Result Date: 03/06/2021 CLINICAL DATA:  Mental status change. New onset delirium and visual hallucinations. Worsening left-sided weakness. History of non-small cell lung cancer with brain metastases. EXAM:  MRI HEAD WITHOUT AND WITH CONTRAST TECHNIQUE: Multiplanar, multiecho pulse sequences of the brain and surrounding structures were obtained without and with intravenous contrast. CONTRAST:  29mL GADAVIST GADOBUTROL 1 MMOL/ML IV SOLN COMPARISON:  Head CT 03/05/2021 and MRI 11/27/2020 FINDINGS: Brain: Sequelae of right frontal craniotomy are again identified. A resection cavity containing blood products in the high posterior right frontal lobe has decreased in size from the prior MRI. Enhancement along the margins of the resection cavity is greatest laterally and inferiorly, measuring up to 5 mm in thickness with direct comparison to the prior MRI limited due to diffuse T1 hyperintense blood products in the cavity on the prior study as well as interval cavity contraction. There is new diffuse dural enhancement over the right cerebral convexity with numerous areas of nodularity including thick, nodular enhancement over the anterior aspect of the right frontal lobe including along the floor of the anterior cranial fossa. Nodular dural enhancement also extends along the falx, greatest anteriorly. Heterogeneously enhancing intra-axial tumor anteriorly in the right frontal lobe measures 2.1 x 1.9 cm and extends medially to abut the falx. Similar tumor anteriorly in the left frontal lobe measures 1.8 x 1.1 cm. Both the intra-axial and dural-based tumor components are hemorrhagic. There is new extensive vasogenic edema anteriorly in the right frontal lobe which extends into the corpus callosum and results in 1.3 cm of leftward midline shift. There is milder vasogenic edema in the  anterior left frontal lobe, and there are also small areas of vasogenic edema scattered elsewhere in the right frontal, right parietal, and right occipital lobes. FLAIR hyperintensity involving multiple right cerebral sulci, predominantly towards the vertex as well as medially along the interhemispheric fissure could reflect a small amount of  subarachnoid hemorrhage, proteinaceous material, or tumor although there is not a significant amount of leptomeningeal enhancement in these regions to clearly indicate the presence of tumor. There could be trace subdural hemorrhage over the right cerebral convexity as well. No acute large territory infarct is present. Vascular: Major intracranial vascular flow voids are preserved. Skull and upper cervical spine: Right frontal craniotomy. No suspicious marrow lesion. Sinuses/Orbits: Bilateral cataract extraction. Paranasal sinuses and mastoid air cells are clear. Other: None. IMPRESSION: 1. Marked progression of intracranial metastatic disease since 11/27/2020. This is largely dural spread of tumor over the right cerebral hemisphere with some intraparenchymal components, most notably in the anterior right frontal lobe where there is extensive vasogenic edema and 1.3 cm of leftward midline shift. 2. Possible small volume subarachnoid and subdural hemorrhage. Electronically Signed   By: Logan Bores M.D.   On: 03/06/2021 12:48   DG Pelvis Portable  Result Date: 03/31/2021 CLINICAL DATA:  [place] today EXAM: PORTABLE PELVIS 1-2 VIEWS COMPARISON:  None. FINDINGS: No evidence of regional fracture. There is osteoarthritis of the right hip joint. There is lower lumbar degenerative change. IMPRESSION: Normal except for chronic osteoarthritis of the hip joint and lower lumbar degenerative change. No acute or traumatic finding. Electronically Signed   By: Nelson Chimes M.D.   On: 03/31/2021 13:17   DG Chest Port 1 View  Result Date: 03/31/2021 CLINICAL DATA:  Fall EXAM: PORTABLE CHEST 1 VIEW COMPARISON:  Chest radiographs, 01/19/2012, CT chest, 8,002 FINDINGS: The heart size and mediastinal contours are within normal limits. Bandlike scarring and fibrosis of the perihilar right lung. The visualized skeletal structures are unremarkable. IMPRESSION: No acute abnormality of the lungs. Bandlike scarring and fibrosis of the  perihilar right lung, unchanged compared to prior CT. Electronically Signed   By: Delanna Ahmadi M.D.   On: 03/31/2021 13:17   VAS [name] LOWER EXTREMITY VENOUS (DVT)  Result Date: 03/08/2021  Lower Venous DVT Study Patient Name:  NAPHTALI RIEDE  Date of Exam:   03/07/2021 Medical Rec #: [id number]        Accession #:    [id number] Date of Birth: December 25, 1945       Patient Gender: F Patient Age:   14 years Exam Location:  [facility] Procedure:      VAS [name] LOWER EXTREMITY VENOUS (DVT) Referring Phys: RAKESH ALVA --------------------------------------------------------------------------------  Indications: Previous DVT - follow up exam.  Risk Factors: Cancer - metastatic lung. Anticoagulation: Eliquis prior to admission. Comparison Study: Previous exam on 12/13/2020 positive for RLE (FV, PopV, & PTV) &                   LLE (gastroc) DVTs Performing Technologist: Jody Hill RVT, RDMS  Examination Guidelines: A complete evaluation includes B-mode imaging, spectral Doppler, color Doppler, and power Doppler as needed of all accessible portions of each vessel. Bilateral testing is considered an integral part of a complete examination. Limited examinations for reoccurring indications may be performed as noted. The reflux portion of the exam is performed with the patient in reverse Trendelenburg.  +---------+---------------+---------+-----------+----------+--------------+  RIGHT     Compressibility Phasicity Spontaneity Properties Thrombus Aging  +---------+---------------+---------+-----------+----------+--------------+  CFV  Full            Yes       Yes                                    +---------+---------------+---------+-----------+----------+--------------+  SFJ       Full                                                             +---------+---------------+---------+-----------+----------+--------------+  FV Prox   Full            Yes       Yes                                     +---------+---------------+---------+-----------+----------+--------------+  FV Mid    Full            Yes       Yes                                    +---------+---------------+---------+-----------+----------+--------------+  FV Distal Full            Yes       Yes                                    +---------+---------------+---------+-----------+----------+--------------+  PFV       Full                                                             +---------+---------------+---------+-----------+----------+--------------+  POP       Full            Yes       Yes                                    +---------+---------------+---------+-----------+----------+--------------+  PTV       Full                                                             +---------+---------------+---------+-----------+----------+--------------+  PERO      Full                                                             +---------+---------------+---------+-----------+----------+--------------+   +---------+---------------+---------+-----------+----------+--------------+  LEFT      Compressibility Phasicity Spontaneity Properties Thrombus Aging  +---------+---------------+---------+-----------+----------+--------------+  CFV       Full            Yes       Yes                                    +---------+---------------+---------+-----------+----------+--------------+  SFJ       Full                                                             +---------+---------------+---------+-----------+----------+--------------+  FV Prox   Full            Yes       Yes                                    +---------+---------------+---------+-----------+----------+--------------+  FV Mid    Full            Yes       Yes                                    +---------+---------------+---------+-----------+----------+--------------+  FV Distal Full            Yes       Yes                                     +---------+---------------+---------+-----------+----------+--------------+  PFV       Full                                                             +---------+---------------+---------+-----------+----------+--------------+  POP       Full            Yes       Yes                                    +---------+---------------+---------+-----------+----------+--------------+  PTV       Full                                                             +---------+---------------+---------+-----------+----------+--------------+  PERO      Full                                                             +---------+---------------+---------+-----------+----------+--------------+     Summary: BILATERAL: - No evidence of deep  vein thrombosis seen in the lower extremities, bilaterally. - No evidence of superficial venous thrombosis in the lower extremities, bilaterally. -No evidence of popliteal cyst, bilaterally.   *See table(s) above for measurements and observations. Electronically signed by Orlie Pollen on 03/08/2021 at 10:43:53 AM.    Final      Discharge Exam: Vitals:   04/04/21 1000 04/04/21 1103  BP:  (!) 115/57  Pulse: (!) 106 (!) 103  Resp:  15  Temp:  97.9 F (36.6 C)  SpO2:  97%   Vitals:   04/04/21 0352 04/04/21 0913 04/04/21 1000 04/04/21 1103  BP: 117/61 (!) 117/58  (!) 115/57  Pulse: 93 (!) 116 (!) 106 (!) 103  Resp: 16 18  15   Temp: 97.7 F (36.5 C) (!) 97.4 F (36.3 C)  97.9 F (36.6 C)  TempSrc: Axillary Oral  Oral  SpO2: 96% 98%  97%  Weight:      Height:        General: Pt is alert, awake, not in acute distress Cardiovascular: RRR, S1/S2 +, no rubs, no gallops Respiratory: CTA bilaterally, no wheezing, no rhonchi Abdominal: Soft, NT, ND, bowel sounds + Extremities: no edema, no cyanosis    The results of significant diagnostics from this hospitalization (including imaging, microbiology, ancillary and laboratory) are listed below for reference.      Microbiology: Recent Results (from the past 240 hour(s))  Resp Panel by RT-PCR (Flu A&B, Covid) Nasopharyngeal Swab     Status: None   Collection Time: 03/31/21  1:55 PM   Specimen: Nasopharyngeal Swab; Nasopharyngeal(NP) swabs in vial transport medium  Result Value Ref Range Status   SARS Coronavirus 2 by RT PCR NEGATIVE NEGATIVE Final    Comment: (NOTE) SARS-CoV-2 target nucleic acids are NOT DETECTED.  The SARS-CoV-2 RNA is generally detectable in upper respiratory specimens during the acute phase of infection. The lowest concentration of SARS-CoV-2 viral copies this assay can detect is 138 copies/mL. A negative result does not preclude SARS-Cov-2 infection and should not be used as the sole basis for treatment or other patient management decisions. A negative result may occur with  improper specimen collection/handling, submission of specimen other than nasopharyngeal swab, presence of viral mutation(s) within the areas targeted by this assay, and inadequate number of viral copies(<138 copies/mL). A negative result must be combined with clinical observations, patient history, and epidemiological information. The expected result is Negative.  Fact Sheet for Patients:  EntrepreneurPulse.com.au  Fact Sheet for Healthcare Providers:  IncredibleEmployment.be  This test is no t yet approved or cleared by the [place] FDA and  has been authorized for detection and/or diagnosis of SARS-CoV-2 by FDA under an Emergency Use Authorization (EUA). This EUA will remain  in effect (meaning this test can be used) for the duration of the COVID-19 declaration under Section 564(b)(1) of the Act, 21 U.S.C.section 360bbb-3(b)(1), unless the authorization is terminated  or revoked sooner.       Influenza A by PCR NEGATIVE NEGATIVE Final   Influenza B by PCR NEGATIVE NEGATIVE Final    Comment: (NOTE) The Xpert Xpress SARS-CoV-2/FLU/RSV plus assay is  intended as an aid in the diagnosis of influenza from Nasopharyngeal swab specimens and should not be used as a sole basis for treatment. Nasal washings and aspirates are unacceptable for Xpert Xpress SARS-CoV-2/FLU/RSV testing.  Fact Sheet for Patients: EntrepreneurPulse.com.au  Fact Sheet for Healthcare Providers: IncredibleEmployment.be  This test is not yet approved or cleared by the [place] and has been authorized  for detection and/or diagnosis of SARS-CoV-2 by FDA under an Emergency Use Authorization (EUA). This EUA will remain in effect (meaning this test can be used) for the duration of the COVID-19 declaration under Section 564(b)(1) of the Act, 21 U.S.C. section 360bbb-3(b)(1), unless the authorization is terminated or revoked.  Performed at [facility], [place] [address] [place], [place] [address]   Resp Panel by RT-PCR (Flu A&B, Covid) Nasopharyngeal Swab     Status: None   Collection Time: 04/03/21  3:39 PM   Specimen: Nasopharyngeal Swab; Nasopharyngeal(NP) swabs in vial transport medium  Result Value Ref Range Status   SARS Coronavirus 2 by RT PCR NEGATIVE NEGATIVE Final    Comment: (NOTE) SARS-CoV-2 target nucleic acids are NOT DETECTED.  The SARS-CoV-2 RNA is generally detectable in upper respiratory specimens during the acute phase of infection. The lowest concentration of SARS-CoV-2 viral copies this assay can detect is 138 copies/mL. A negative result does not preclude SARS-Cov-2 infection and should not be used as the sole basis for treatment or other patient management decisions. A negative result may occur with  improper specimen collection/handling, submission of specimen other than nasopharyngeal swab, presence of viral mutation(s) within the areas targeted by this assay, and inadequate number of viral copies(<138 copies/mL). A negative result must be combined with clinical observations, patient  history, and epidemiological information. The expected result is Negative.  Fact Sheet for Patients:  EntrepreneurPulse.com.au  Fact Sheet for Healthcare Providers:  IncredibleEmployment.be  This test is no t yet approved or cleared by the [place] FDA and  has been authorized for detection and/or diagnosis of SARS-CoV-2 by FDA under an Emergency Use Authorization (EUA). This EUA will remain  in effect (meaning this test can be used) for the duration of the COVID-19 declaration under Section 564(b)(1) of the Act, 21 U.S.C.section 360bbb-3(b)(1), unless the authorization is terminated  or revoked sooner.       Influenza A by PCR NEGATIVE NEGATIVE Final   Influenza B by PCR NEGATIVE NEGATIVE Final    Comment: (NOTE) The Xpert Xpress SARS-CoV-2/FLU/RSV plus assay is intended as an aid in the diagnosis of influenza from Nasopharyngeal swab specimens and should not be used as a sole basis for treatment. Nasal washings and aspirates are unacceptable for Xpert Xpress SARS-CoV-2/FLU/RSV testing.  Fact Sheet for Patients: EntrepreneurPulse.com.au  Fact Sheet for Healthcare Providers: IncredibleEmployment.be  This test is not yet approved or cleared by the [place] FDA and has been authorized for detection and/or diagnosis of SARS-CoV-2 by FDA under an Emergency Use Authorization (EUA). This EUA will remain in effect (meaning this test can be used) for the duration of the COVID-19 declaration under Section 564(b)(1) of the Act, 21 U.S.C. section 360bbb-3(b)(1), unless the authorization is terminated or revoked.  Performed at [facility], [place] [address], [place], [place] [address]      Labs: BNP (last 3 results) No results for input(s): BNP in the last 8760 hours. Basic Metabolic Panel: Recent Labs  Lab 03/31/21 1354 04/01/21 0426 04/02/21 0239  NA 142 137 137  K 3.8 3.4* 4.0  CL 107  106 106  CO2 27 24 26   GLUCOSE 148* 176* 78  BUN 20 19 19   CREATININE 0.59 0.63 0.62  CALCIUM 9.0 8.0* 8.6*  MG  --  1.7  --    Liver Function Tests: Recent Labs  Lab 03/31/21 1354 04/01/21 0426  AST 29 30  ALT 75* 57*  ALKPHOS 73 65  BILITOT  1.0 0.7  PROT 5.5* 4.1*  ALBUMIN 2.9* 2.1*   No results for input(s): LIPASE, AMYLASE in the last 168 hours. No results for input(s): AMMONIA in the last 168 hours. CBC: Recent Labs  Lab 03/31/21 1354 04/01/21 0426  WBC 9.1 5.8  NEUTROABS  --  4.5  HGB 14.7 12.2  HCT 45.7 38.5  MCV 96.0 96.0  PLT 106* 89*   Cardiac Enzymes: Recent Labs  Lab 03/31/21 1354  CKTOTAL 63   BNP: Invalid input(s): POCBNP CBG: Recent Labs  Lab 04/03/21 1605 04/03/21 1644 04/03/21 2111 04/04/21 0632 04/04/21 1109  GLUCAP 416* 369* 131* 176* 172*   D-Dimer No results for input(s): DDIMER in the last 72 hours. Hgb A1c No results for input(s): HGBA1C in the last 72 hours. Lipid Profile No results for input(s): CHOL, HDL, LDLCALC, TRIG, CHOLHDL, LDLDIRECT in the last 72 hours. Thyroid function studies No results for input(s): TSH, T4TOTAL, T3FREE, THYROIDAB in the last 72 hours.  Invalid input(s): FREET3 Anemia work up No results for input(s): VITAMINB12, FOLATE, FERRITIN, TIBC, IRON, RETICCTPCT in the last 72 hours. Urinalysis    Component Value Date/Time   COLORURINE YELLOW 03/31/2021 2052   APPEARANCEUR CLEAR 03/31/2021 2052   APPEARANCEUR Clear 08/15/2020 1508   LABSPEC 1.020 03/31/2021 2052   PHURINE 6.0 03/31/2021 2052   GLUCOSEU 100 (A) 03/31/2021 2052   HGBUR NEGATIVE 03/31/2021 2052   BILIRUBINUR NEGATIVE 03/31/2021 2052   BILIRUBINUR Negative 08/15/2020 [place] 03/31/2021 2052   PROTEINUR NEGATIVE 03/31/2021 2052   UROBILINOGEN 0.2 09/20/2018 1556   NITRITE NEGATIVE 03/31/2021 2052   LEUKOCYTESUR SMALL (A) 03/31/2021 2052   Sepsis Labs Invalid input(s): PROCALCITONIN,  WBC,   LACTICIDVEN Microbiology Recent Results (from the past 240 hour(s))  Resp Panel by RT-PCR (Flu A&B, Covid) Nasopharyngeal Swab     Status: None   Collection Time: 03/31/21  1:55 PM   Specimen: Nasopharyngeal Swab; Nasopharyngeal(NP) swabs in vial transport medium  Result Value Ref Range Status   SARS Coronavirus 2 by RT PCR NEGATIVE NEGATIVE Final    Comment: (NOTE) SARS-CoV-2 target nucleic acids are NOT DETECTED.  The SARS-CoV-2 RNA is generally detectable in upper respiratory specimens during the acute phase of infection. The lowest concentration of SARS-CoV-2 viral copies this assay can detect is 138 copies/mL. A negative result does not preclude SARS-Cov-2 infection and should not be used as the sole basis for treatment or other patient management decisions. A negative result may occur with  improper specimen collection/handling, submission of specimen other than nasopharyngeal swab, presence of viral mutation(s) within the areas targeted by this assay, and inadequate number of viral copies(<138 copies/mL). A negative result must be combined with clinical observations, patient history, and epidemiological information. The expected result is Negative.  Fact Sheet for Patients:  EntrepreneurPulse.com.au  Fact Sheet for Healthcare Providers:  IncredibleEmployment.be  This test is no t yet approved or cleared by the [place] FDA and  has been authorized for detection and/or diagnosis of SARS-CoV-2 by FDA under an Emergency Use Authorization (EUA). This EUA will remain  in effect (meaning this test can be used) for the duration of the COVID-19 declaration under Section 564(b)(1) of the Act, 21 U.S.C.section 360bbb-3(b)(1), unless the authorization is terminated  or revoked sooner.       Influenza A by PCR NEGATIVE NEGATIVE Final   Influenza B by PCR NEGATIVE NEGATIVE Final    Comment: (NOTE) The Xpert Xpress SARS-CoV-2/FLU/RSV plus  assay is intended as an  aid in the diagnosis of influenza from Nasopharyngeal swab specimens and should not be used as a sole basis for treatment. Nasal washings and aspirates are unacceptable for Xpert Xpress SARS-CoV-2/FLU/RSV testing.  Fact Sheet for Patients: EntrepreneurPulse.com.au  Fact Sheet for Healthcare Providers: IncredibleEmployment.be  This test is not yet approved or cleared by the [place] FDA and has been authorized for detection and/or diagnosis of SARS-CoV-2 by FDA under an Emergency Use Authorization (EUA). This EUA will remain in effect (meaning this test can be used) for the duration of the COVID-19 declaration under Section 564(b)(1) of the Act, 21 U.S.C. section 360bbb-3(b)(1), unless the authorization is terminated or revoked.  Performed at [facility], [place] [address] [place], [place] [address]   Resp Panel by RT-PCR (Flu A&B, Covid) Nasopharyngeal Swab     Status: None   Collection Time: 04/03/21  3:39 PM   Specimen: Nasopharyngeal Swab; Nasopharyngeal(NP) swabs in vial transport medium  Result Value Ref Range Status   SARS Coronavirus 2 by RT PCR NEGATIVE NEGATIVE Final    Comment: (NOTE) SARS-CoV-2 target nucleic acids are NOT DETECTED.  The SARS-CoV-2 RNA is generally detectable in upper respiratory specimens during the acute phase of infection. The lowest concentration of SARS-CoV-2 viral copies this assay can detect is 138 copies/mL. A negative result does not preclude SARS-Cov-2 infection and should not be used as the sole basis for treatment or other patient management decisions. A negative result may occur with  improper specimen collection/handling, submission of specimen other than nasopharyngeal swab, presence of viral mutation(s) within the areas targeted by this assay, and inadequate number of viral copies(<138 copies/mL). A negative result must be combined with clinical observations,  patient history, and epidemiological information. The expected result is Negative.  Fact Sheet for Patients:  EntrepreneurPulse.com.au  Fact Sheet for Healthcare Providers:  IncredibleEmployment.be  This test is no t yet approved or cleared by the [place] FDA and  has been authorized for detection and/or diagnosis of SARS-CoV-2 by FDA under an Emergency Use Authorization (EUA). This EUA will remain  in effect (meaning this test can be used) for the duration of the COVID-19 declaration under Section 564(b)(1) of the Act, 21 U.S.C.section 360bbb-3(b)(1), unless the authorization is terminated  or revoked sooner.       Influenza A by PCR NEGATIVE NEGATIVE Final   Influenza B by PCR NEGATIVE NEGATIVE Final    Comment: (NOTE) The Xpert Xpress SARS-CoV-2/FLU/RSV plus assay is intended as an aid in the diagnosis of influenza from Nasopharyngeal swab specimens and should not be used as a sole basis for treatment. Nasal washings and aspirates are unacceptable for Xpert Xpress SARS-CoV-2/FLU/RSV testing.  Fact Sheet for Patients: EntrepreneurPulse.com.au  Fact Sheet for Healthcare Providers: IncredibleEmployment.be  This test is not yet approved or cleared by the [place] FDA and has been authorized for detection and/or diagnosis of SARS-CoV-2 by FDA under an Emergency Use Authorization (EUA). This EUA will remain in effect (meaning this test can be used) for the duration of the COVID-19 declaration under Section 564(b)(1) of the Act, 21 U.S.C. section 360bbb-3(b)(1), unless the authorization is terminated or revoked.  Performed at [facility], [place] [address] [place], [place] [address]      Time coordinating discharge: Over 30 minutes  SIGNED:   Darliss Cheney, MD  [facility] 04/04/2021, 2:10 PM  If 7PM-7AM, please contact night-coverage [email or web address]

## 2021-04-04 NOTE — TOC Progression Note (Signed)
Transition of Care [facility]) - Progression Note    Patient Details  Name: Katelyn Kennedy MRN: [id number] Date of Birth: 04-30-1945  Transition of Care [facility]) CM/SW [place], [place] Phone Number: 04/04/2021, 2:15 PM  Clinical Narrative:   Patient approved for SNF after peer to peer, awaiting auth number at this time. CSW updated patient and [facility].   Family to arrive to provide transport at 4:00 PM. Nurse to call report to [phone].    Expected Discharge Plan: Skilled Nursing Facility Barriers to Discharge: Continued Medical Work up, Ship broker  Expected Discharge Plan and Services Expected Discharge Plan: [place]: [place] Living arrangements for the past 2 months: Apartment Expected Discharge Date: 04/04/21                                     Social Determinants of Health (SDOH) Interventions    Readmission Risk Interventions Readmission Risk Prevention Plan 03/19/2021 11/08/2020  Post Dischage Appt - Complete  Medication Screening - Complete  Transportation Screening Complete Complete  PCP or Specialist Appt within 3-5 Days Complete -  HRI or Home Care Consult Complete -  Social Work Consult for [place] Planning/Counseling Complete -  Palliative Care Screening Not Applicable -  Medication Review Press photographer) Complete -  Some recent data might be hidden

## 2021-04-04 NOTE — Progress Notes (Signed)
Occupational Therapy Treatment Patient Details Name: Katelyn Kennedy MRN: [id number] DOB: [birth year] Today's Date: 04/04/2021   History of present illness This 75 y.o. female presenting after fall at home. Of note, most recently patient was hospitalized at [facility] long hospital from 11/23 until 12/7 due to worsening left-sided weakness found to have progression of metastatic disease to the brain with new hemorrhagic lesions with associated vasogenic edema and midline shift. 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   OT comments  Patient received sitting at sink with nursing tech assisting and did not want female therapist to assist. Performed grooming at sink and doffing and donning socks seated. Patient performed mobility in hallway with RW and verbal cues to use walker correctly. Patient making good gains with OT treatment. Acute OT to continue to follow.    Recommendations for follow up therapy are one component of a multi-disciplinary discharge planning process, led by the attending physician.  Recommendations may be updated based on patient status, additional functional criteria and insurance authorization.    Follow Up Recommendations  Skilled nursing-short term rehab (<3 hours/day)    Assistance Recommended at Discharge Frequent or constant Supervision/Assistance  Equipment Recommendations  Other (comment) (TBD)    Recommendations for Other Services      Precautions / Restrictions Precautions Precautions: Fall Precaution Comments: monitor BP Restrictions Weight Bearing Restrictions: No       Mobility Bed Mobility Overal bed mobility: Needs Assistance             General bed mobility comments: OOB upon arrival and in recliner at end of session    Transfers Overall transfer level: Needs assistance Equipment used: Rolling walker  (2 wheels) Transfers: Sit to/from Stand Sit to Stand: Min guard           General transfer comment: required cues to stay within close distance of RW     Balance Overall balance assessment: Needs assistance Sitting-balance support: Feet supported;Bilateral upper extremity supported;No upper extremity supported Sitting balance-Leahy Scale: Fair     Standing balance support: Bilateral upper extremity supported;During functional activity;No upper extremity supported Standing balance-Leahy Scale: Fair Standing balance comment: able to stand at sink for grooming tasks                           ADL either performed or assessed with clinical judgement   ADL Overall ADL's : Needs assistance/impaired     Grooming: Min guard;Standing   Upper Body Bathing: Set up;Sitting   Lower Body Bathing: Minimal assistance Lower Body Bathing Details (indicate cue type and reason): nursing tech assisted with LB bathing due to female therapist Upper Body Dressing : Set up;Sitting   Lower Body Dressing: Minimal assistance;Sit to/from stand Lower Body Dressing Details (indicate cue type and reason): able to doff socks but required assistance to get socks over toes             Functional mobility during ADLs: Min guard;Rolling walker (2 wheels) General ADL Comments: patient received sitting at sink perfromed bathing with nursing tech assisting and did not want female to assist    Extremity/Trunk Assessment Upper Extremity Assessment LUE Deficits / Details: LUE weaker than RUE LUE Sensation: decreased light touch LUE Coordination: decreased fine motor;decreased gross motor            Vision  Perception     Praxis      Cognition Arousal/Alertness: Awake/alert Behavior During Therapy: WFL for tasks assessed/performed Overall Cognitive Status: Impaired/Different from baseline Area of Impairment: Attention;Following commands;Problem solving;Safety/judgement;Memory                    Current Attention Level: Selective Memory: Decreased short-term memory Following Commands: Follows multi-step commands with increased time   Awareness: Emergent Problem Solving: Difficulty sequencing General Comments: oriented x4          Exercises     Shoulder Instructions       General Comments      Pertinent Vitals/ Pain       Pain Assessment: No/denies pain  Home Living                                          Prior Functioning/Environment              Frequency  Min 2X/week        Progress Toward Goals  OT Goals(current goals can now be found in the care plan section)  Progress towards OT goals: Progressing toward goals  Acute Rehab OT Goals Patient Stated Goal: go to rehab OT Goal Formulation: With patient Time For Goal Achievement: 04/15/21 Potential to Achieve Goals: Good ADL Goals Pt Will Perform Grooming: with modified independence Pt Will Perform Lower Body Bathing: with modified independence Pt Will Perform Lower Body Dressing: with modified independence Pt Will Transfer to Toilet: with modified independence Pt Will Perform Toileting - Clothing Manipulation and hygiene: with modified independence;sitting/lateral leans;sit to/from stand Pt Will Perform Tub/Shower Transfer: Tub transfer;shower seat;with supervision;grab bars Additional ADL Goal #1: Patient will tolerate BUE home exercise program 10-15 reps within pain-free ranges, in an unsupported seated position, in order to improve upper body strength, endurance and core stability needed to complete home ADLs.   Pt will perform LT UE coordination exercises with supervision-Min assist and LT strengthening exercises as tolerated. Additional ADL Goal #2: Pt will demonstrate improved cognition by consistenyl following 2-3 step commands without supplimental cues and attending to one task or topic at a time for 1 full minute.  Plan Discharge plan remains  appropriate    Co-evaluation                 AM-PAC OT "6 Clicks" Daily Activity     Outcome Measure   Help from another person eating meals?: None Help from another person taking care of personal grooming?: A Little Help from another person toileting, which includes using toliet, bedpan, or urinal?: A Little Help from another person bathing (including washing, rinsing, drying)?: A Little Help from another person to put on and taking off regular upper body clothing?: A Little Help from another person to put on and taking off regular lower body clothing?: A Little 6 Click Score: 19    End of Session Equipment Utilized During Treatment: Gait belt;Rolling walker (2 wheels)  OT Visit Diagnosis: Unsteadiness on feet (R26.81);Other symptoms and signs involving cognitive function;Other symptoms and signs involving the nervous system (R29.898);Muscle weakness (generalized) (M62.81);History of falling (Z91.81)   Activity Tolerance Patient tolerated treatment well   Patient Left in chair;with call bell/phone within reach;with chair alarm set   Nurse Communication Mobility status        Time: 3818-2993 OT Time Calculation (min): 30 min  Charges: OT General Charges $OT  Visit: 1 Visit OT Treatments $Self Care/Home Management : 23-37 mins  Lodema Hong, [place]  Pager [phone] Office [place] 04/04/2021, 10:20 AM

## 2021-04-04 NOTE — Plan of Care (Signed)
°  Problem: Health Behavior/Discharge Planning: Goal: Ability to manage health-related needs will improve Outcome: Progressing   Problem: Clinical Measurements: Goal: Ability to maintain clinical measurements within normal limits will improve Outcome: Progressing Goal: Will remain free from infection Outcome: Progressing Goal: Diagnostic test results will improve Outcome: Progressing Goal: Respiratory complications will improve Outcome: Progressing Goal: Cardiovascular complication will be avoided Outcome: Progressing   Problem: Safety: Goal: Ability to remain free from injury will improve Outcome: Progressing

## 2021-04-09 DIAGNOSIS — E1169 Type 2 diabetes mellitus with other specified complication: Secondary | ICD-10-CM | POA: Diagnosis not present

## 2021-04-17 DIAGNOSIS — E785 Hyperlipidemia, unspecified: Secondary | ICD-10-CM | POA: Diagnosis not present

## 2021-04-17 DIAGNOSIS — E1165 Type 2 diabetes mellitus with hyperglycemia: Secondary | ICD-10-CM | POA: Diagnosis not present

## 2021-04-17 DIAGNOSIS — C7931 Secondary malignant neoplasm of brain: Secondary | ICD-10-CM | POA: Diagnosis not present

## 2021-04-17 DIAGNOSIS — R531 Weakness: Secondary | ICD-10-CM | POA: Diagnosis not present

## 2021-04-22 ENCOUNTER — Encounter: Payer: Self-pay | Admitting: Internal Medicine

## 2021-04-22 DIAGNOSIS — I739 Peripheral vascular disease, unspecified: Secondary | ICD-10-CM | POA: Diagnosis not present

## 2021-04-22 DIAGNOSIS — E1169 Type 2 diabetes mellitus with other specified complication: Secondary | ICD-10-CM | POA: Diagnosis not present

## 2021-04-22 DIAGNOSIS — G936 Cerebral edema: Secondary | ICD-10-CM | POA: Diagnosis not present

## 2021-04-22 DIAGNOSIS — I10 Essential (primary) hypertension: Secondary | ICD-10-CM | POA: Diagnosis not present

## 2021-04-23 ENCOUNTER — Encounter: Payer: Self-pay | Admitting: Internal Medicine

## 2021-04-23 ENCOUNTER — Encounter: Payer: Self-pay | Admitting: Radiology

## 2021-04-24 DIAGNOSIS — I10 Essential (primary) hypertension: Secondary | ICD-10-CM | POA: Diagnosis not present

## 2021-04-24 DIAGNOSIS — S065X0D Traumatic subdural hemorrhage without loss of consciousness, subsequent encounter: Secondary | ICD-10-CM | POA: Diagnosis not present

## 2021-04-24 DIAGNOSIS — C7931 Secondary malignant neoplasm of brain: Secondary | ICD-10-CM | POA: Diagnosis not present

## 2021-04-24 DIAGNOSIS — I951 Orthostatic hypotension: Secondary | ICD-10-CM | POA: Diagnosis not present

## 2021-04-24 DIAGNOSIS — Z87891 Personal history of nicotine dependence: Secondary | ICD-10-CM | POA: Diagnosis not present

## 2021-04-24 DIAGNOSIS — E1151 Type 2 diabetes mellitus with diabetic peripheral angiopathy without gangrene: Secondary | ICD-10-CM | POA: Diagnosis not present

## 2021-04-24 DIAGNOSIS — E1169 Type 2 diabetes mellitus with other specified complication: Secondary | ICD-10-CM | POA: Diagnosis not present

## 2021-04-24 DIAGNOSIS — C3431 Malignant neoplasm of lower lobe, right bronchus or lung: Secondary | ICD-10-CM | POA: Diagnosis not present

## 2021-04-24 DIAGNOSIS — W19XXXD Unspecified fall, subsequent encounter: Secondary | ICD-10-CM | POA: Diagnosis not present

## 2021-04-24 DIAGNOSIS — Z86718 Personal history of other venous thrombosis and embolism: Secondary | ICD-10-CM | POA: Diagnosis not present

## 2021-04-24 DIAGNOSIS — E785 Hyperlipidemia, unspecified: Secondary | ICD-10-CM | POA: Diagnosis not present

## 2021-04-24 DIAGNOSIS — M1990 Unspecified osteoarthritis, unspecified site: Secondary | ICD-10-CM | POA: Diagnosis not present

## 2021-04-28 ENCOUNTER — Encounter: Payer: Self-pay | Admitting: Internal Medicine

## 2021-04-28 ENCOUNTER — Encounter: Payer: Self-pay | Admitting: Nurse Practitioner

## 2021-04-28 ENCOUNTER — Inpatient Hospital Stay: Payer: PPO

## 2021-04-28 ENCOUNTER — Inpatient Hospital Stay: Payer: PPO | Attending: Internal Medicine | Admitting: Internal Medicine

## 2021-04-28 ENCOUNTER — Inpatient Hospital Stay (HOSPITAL_BASED_OUTPATIENT_CLINIC_OR_DEPARTMENT_OTHER): Payer: PPO | Admitting: Nurse Practitioner

## 2021-04-28 ENCOUNTER — Other Ambulatory Visit: Payer: Self-pay

## 2021-04-28 ENCOUNTER — Telehealth: Payer: Self-pay | Admitting: Medical Oncology

## 2021-04-28 ENCOUNTER — Telehealth: Payer: Self-pay | Admitting: Family Medicine

## 2021-04-28 VITALS — BP 134/61 | HR 125 | Temp 96.8°F | Resp 18 | Ht 59.0 in | Wt 133.9 lb

## 2021-04-28 DIAGNOSIS — C3490 Malignant neoplasm of unspecified part of unspecified bronchus or lung: Secondary | ICD-10-CM

## 2021-04-28 DIAGNOSIS — R531 Weakness: Secondary | ICD-10-CM | POA: Diagnosis not present

## 2021-04-28 DIAGNOSIS — C3431 Malignant neoplasm of lower lobe, right bronchus or lung: Secondary | ICD-10-CM | POA: Insufficient documentation

## 2021-04-28 DIAGNOSIS — Z833 Family history of diabetes mellitus: Secondary | ICD-10-CM | POA: Insufficient documentation

## 2021-04-28 DIAGNOSIS — C7931 Secondary malignant neoplasm of brain: Secondary | ICD-10-CM | POA: Diagnosis not present

## 2021-04-28 DIAGNOSIS — G40909 Epilepsy, unspecified, not intractable, without status epilepticus: Secondary | ICD-10-CM | POA: Diagnosis not present

## 2021-04-28 DIAGNOSIS — Z923 Personal history of irradiation: Secondary | ICD-10-CM | POA: Insufficient documentation

## 2021-04-28 DIAGNOSIS — Z800 Family history of malignant neoplasm of digestive organs: Secondary | ICD-10-CM | POA: Insufficient documentation

## 2021-04-28 DIAGNOSIS — E119 Type 2 diabetes mellitus without complications: Secondary | ICD-10-CM | POA: Diagnosis not present

## 2021-04-28 DIAGNOSIS — Z79899 Other long term (current) drug therapy: Secondary | ICD-10-CM | POA: Diagnosis not present

## 2021-04-28 DIAGNOSIS — Z87891 Personal history of nicotine dependence: Secondary | ICD-10-CM | POA: Diagnosis not present

## 2021-04-28 DIAGNOSIS — Z803 Family history of malignant neoplasm of breast: Secondary | ICD-10-CM | POA: Insufficient documentation

## 2021-04-28 DIAGNOSIS — Z515 Encounter for palliative care: Secondary | ICD-10-CM

## 2021-04-28 DIAGNOSIS — R29818 Other symptoms and signs involving the nervous system: Secondary | ICD-10-CM | POA: Insufficient documentation

## 2021-04-28 DIAGNOSIS — Z885 Allergy status to narcotic agent status: Secondary | ICD-10-CM | POA: Insufficient documentation

## 2021-04-28 DIAGNOSIS — Z8349 Family history of other endocrine, nutritional and metabolic diseases: Secondary | ICD-10-CM | POA: Diagnosis not present

## 2021-04-28 LAB — CBC WITH DIFFERENTIAL (CANCER CENTER ONLY)
Abs Immature Granulocytes: 0.41 10*3/uL — ABNORMAL HIGH (ref 0.00–0.07)
Basophils Absolute: 0.1 10*3/uL (ref 0.0–0.1)
Basophils Relative: 1 %
Eosinophils Absolute: 0 10*3/uL (ref 0.0–0.5)
Eosinophils Relative: 0 %
HCT: 36.1 % (ref 36.0–46.0)
Hemoglobin: 11.7 g/dL — ABNORMAL LOW (ref 12.0–15.0)
Immature Granulocytes: 4 %
Lymphocytes Relative: 21 %
Lymphs Abs: 2.2 10*3/uL (ref 0.7–4.0)
MCH: 29.9 pg (ref 26.0–34.0)
MCHC: 32.4 g/dL (ref 30.0–36.0)
MCV: 92.3 fL (ref 80.0–100.0)
Monocytes Absolute: 1.3 10*3/uL — ABNORMAL HIGH (ref 0.1–1.0)
Monocytes Relative: 12 %
Neutro Abs: 6.4 10*3/uL (ref 1.7–7.7)
Neutrophils Relative %: 62 %
Platelet Count: 330 10*3/uL (ref 150–400)
RBC: 3.91 MIL/uL (ref 3.87–5.11)
RDW: 16.2 % — ABNORMAL HIGH (ref 11.5–15.5)
WBC Count: 10.3 10*3/uL (ref 4.0–10.5)
nRBC: 0 % (ref 0.0–0.2)

## 2021-04-28 LAB — CMP (CANCER CENTER ONLY)
ALT: 18 U/L (ref 0–44)
AST: 19 U/L (ref 15–41)
Albumin: 3.5 g/dL (ref 3.5–5.0)
Alkaline Phosphatase: 158 U/L — ABNORMAL HIGH (ref 38–126)
Anion gap: 7 (ref 5–15)
BUN: 13 mg/dL (ref 8–23)
CO2: 27 mmol/L (ref 22–32)
Calcium: 9.6 mg/dL (ref 8.9–10.3)
Chloride: 104 mmol/L (ref 98–111)
Creatinine: 0.73 mg/dL (ref 0.44–1.00)
GFR, Estimated: >60 mL/min (ref >60)
Glucose, Bld: 192 mg/dL — ABNORMAL HIGH (ref 70–99)
Potassium: 4 mmol/L (ref 3.5–5.1)
Sodium: 138 mmol/L (ref 135–145)
Total Bilirubin: 0.6 mg/dL (ref 0.3–1.2)
Total Protein: 6.9 g/dL (ref 6.5–8.1)

## 2021-04-28 NOTE — Telephone Encounter (Signed)
Message to [facility] in Radiology to schedule pt this week for CT chest.

## 2021-04-28 NOTE — Progress Notes (Signed)
[place]  Telephone:(336) [phone] Fax:(336) [phone]   Name: Katelyn Kennedy Date: 04/28/2021 MRN: [id number]  DOB: 1946/03/07  Patient Care Team: Katelyn Sanders, MD as PCP - General Katelyn Kennedy, [facility] as Pharmacist (Pharmacist) Katelyn Pray, MD as Consulting Physician (Radiation Oncology)    INTERVAL HISTORY: Katelyn Kennedy is a 76 y.o. 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  She was admitted on 03/05/2021 from home with worsening left-sided weakness. Discharged to SNF rehab. CT of head showed progression of metastatic lung cancer with multiple hemorrhagic lesions and vasogenic edema.  Patient followed by Dr. Sondra Kennedy (Peters) and Dr. Julien Kennedy. Recently completed whole brain radiation.   SOCIAL HISTORY:     reports that she quit smoking about 4 years ago. Her smoking use included cigarettes. She has a 20.00 pack-year smoking history. She has never used smokeless tobacco. She reports that she does not currently use alcohol. She reports that she does not use drugs.  ADVANCE DIRECTIVES:  Completed during recent hospitalization. Patient named her sister Katelyn Kennedy as her primary medical decision maker given her daughters both live out of state.  CODE STATUS: DNR  PAST MEDICAL HISTORY: Past Medical History:  Diagnosis Date   Allergic rhinitis    Arthritis    Diabetes mellitus without complication ([place])    diet control/no meds per pt   Dyspnea    Essential hypertension 04/11/2019   History of chicken pox    History of hiatal hernia    History of radiation therapy    right lung - 06/28/2017-08/11/2017  Dr Katelyn Kennedy   History of radiation therapy    SRS brain - 11/29/2020-12/04/2020  Dr Katelyn Kennedy/Katelyn Kennedy   History of radiation therapy    Whole brain - 03/10/2021-03/19/2021 Dr  Katelyn Kennedy   lung ca dx'd 04/2017   Peripheral vascular disease ([place])    Pneumonia    PONV (postoperative nausea and vomiting)    Smoker     ALLERGIES:  is allergic to codeine.  MEDICATIONS:  Current Outpatient Medications  Medication Sig Dispense Refill   amitriptyline (ELAVIL) 50 MG tablet Take 1 tablet (50 mg total) by mouth at bedtime. 90 tablet 3   atorvastatin (LIPITOR) 40 MG tablet 1 tablet  po daily (Patient taking differently: Take 40 mg by mouth daily.) 90 tablet 3   Calcium Citrate-Vitamin D (CITRACAL + D PO) Take 1 tablet by mouth daily.     cholecalciferol (VITAMIN D) 25 MCG (1000 UNIT) tablet Take 1,000 Units by mouth daily.     famotidine (PEPCID) 20 MG tablet Take 1 tablet (20 mg total) by mouth daily. 30 tablet 0   lisinopril (ZESTRIL) 2.5 MG tablet Take 2.5 mg by mouth daily. (Patient not taking: Reported on 04/28/2021)     Multiple Vitamin (MULTIVITAMIN) capsule Take 1 capsule by mouth daily. Centrum Silver     polyethylene glycol (MIRALAX / GLYCOLAX) 17 g packet Take 17 g by mouth daily as needed for moderate constipation. 14 each 0   vitamin B-12 (CYANOCOBALAMIN) 1000 MCG tablet Take 1,000 mcg by mouth daily.     No current facility-administered medications for this visit.    VITAL SIGNS: There were no vitals taken for this visit. There were no vitals filed for this visit.  Estimated body mass index is 27.04 kg/m as calculated from the following:  Height as of an earlier encounter on 04/28/21: 4\' 11"  (1.499 m).   Weight as of an earlier encounter on 04/28/21: 133 lb 14.4 oz (60.7 kg).   PERFORMANCE STATUS (ECOG) : 1 - Symptomatic but completely ambulatory   Physical Exam General: NAD Cardiovascular: Tachycardic Pulmonary: clear ant fields Abdomen: soft, nontender, + bowel sounds Extremities: no edema, no joint deformities Neurological: Weakness but otherwise nonfocal  IMPRESSION:  Ms. Pick is here today for follow-up with support from her  sister-in-law Katelyn Kennedy. She is ambulatory with her Rolator. Reports she recently returned home on 04/22/2021 from skilled rehab facility.  Her family and friends are taking shoes to be available for her in the home to allow her every opportunity to continue to do well.  She is able to perform ADLs with some assistance.  She shares she has been unable to take a shower due to the need for shower chair or rolling to allow her to step into the tub.  She is also in need of a hand-held shower head to allow for more flexibility.  We discussed getting the items ordered with hopes of allowing her to continue to thrive is much as possible and with some independency as she desires.  Family states they are in the process of having home health PT/OT coming to the home to also assist her now that she has return and be able to provide additional support and recommendations to maneuver.  [name] denies any symptoms.  Reports her appetite has been good.  Denies pain.  He was appreciative of her physical improvement.  She shows me how she is able to do leg lifts and heel-to-toe movements on her own.  I discussed the importance of continued conversation with family and their medical providers regarding overall plan of care and treatment options, ensuring decisions are within the context of the patients values and GOCs.  PLAN: Shower bench and hand-held shower head order for additional home support and independently. No current symptom needs Ongoing palliative support is needed.  I will plan to follow-up with her in 3-4 weeks.   Patient expressed understanding and was in agreement with this plan. She also understands that She can call the clinic at any time with any questions, concerns, or complaints.   Time Total: 25 min.   Visit consisted of counseling and education dealing with the complex and emotionally intense issues of symptom management and palliative care in the setting of serious and potentially  life-threatening illness.Greater than 50%  of this time was spent counseling and coordinating care related to the above assessment and plan.  Signed by: Katelyn Kennedy, AGPCNP-BC [place]

## 2021-04-28 NOTE — Telephone Encounter (Signed)
Katelyn Kennedy called in due to she was recently released from rehab at [facility] and while there they removed the lisinopril and wanted to know if she needs to start taking it again. And she went to the cardiologist and they checked her heart rate and they took an ekg and they didn't see anything major. But she wanted to know if she needed to take it again.

## 2021-04-28 NOTE — Progress Notes (Signed)
[name] Telephone:([phone]   Fax:(336) [phone]  OFFICE PROGRESS NOTE  [name], MD [place] [address]  DIAGNOSIS: Metastatic non-small cell lung cancer, adenocarcinoma initially diagnosed as stage IIIA (T2a, N2, M0) non-small cell lung cancer, adenocarcinoma presenting with right lower lobe lung mass in addition to subcarinal lymphadenopathy and suspicious metastatic pulmonary nodules diagnosed in February 2019.  She has evidence of metastatic disease with solitary brain metastasis in July 2022  PRIOR THERAPY: 1) Concurrent chemoradiation with carboplatin for an AUC of 2 with paclitaxel 45 mg/m weekly. First dose given on 06/28/2017.  Status post 7 cycles.  Last dose was giving August 09, 2017 with partial response. 2) Consolidation immunotherapy with Imfinzi (Durvalumab) 10 mg/KG every 2 weeks, first dose 09/23/2017.  Status post 26 cycles. Last dose was given on Sep 08, 2018. 3) status post craniotomy with resection of right posterior frontal dural based metastasis under the care of Dr. Trenton Gammon on November 07, 2020.  CURRENT THERAPY: Observation.  INTERVAL HISTORY: Katelyn Kennedy 76 y.o. female returns to the clinic today for follow-up visit.  The patient is feeling fine today with no concerning complaints except for fatigue and tachycardia.  She was recently discharged from a rehab facility after being there for physical therapy from generalized weakness and fatigue.  She denied having any current chest pain, shortness of breath, cough or hemoptysis.  She denied having any fever or chills.  She has no nausea, vomiting, diarrhea or constipation.  She has no headache or visual changes.  She was supposed to have repeat CT scan of the chest few days ago but this was not scheduled as ordered.  The patient is here today for evaluation and repeat blood work.   MEDICAL HISTORY: Past Medical History:  Diagnosis Date   Allergic rhinitis     Arthritis    Diabetes mellitus without complication ([place])    diet control/no meds per pt   Dyspnea    Essential hypertension 04/11/2019   History of chicken pox    History of hiatal hernia    History of radiation therapy    right lung - 06/28/2017-08/11/2017  Dr Kyung Rudd   History of radiation therapy    SRS brain - 11/29/2020-12/04/2020  Dr Jenny Reichmann [place]/Matthew Tammi Klippel   History of radiation therapy    Whole brain - 03/10/2021-03/19/2021 Dr Gery Pray   lung ca dx'd 04/2017   Peripheral vascular disease ([place])    Pneumonia    PONV (postoperative nausea and vomiting)    Smoker     ALLERGIES:  is allergic to codeine.  MEDICATIONS:  Current Outpatient Medications  Medication Sig Dispense Refill   amitriptyline (ELAVIL) 50 MG tablet Take 1 tablet (50 mg total) by mouth at bedtime. 90 tablet 3   atorvastatin (LIPITOR) 40 MG tablet 1 tablet  po daily (Patient taking differently: Take 40 mg by mouth daily.) 90 tablet 3   Calcium Citrate-Vitamin D (CITRACAL + D PO) Take 1 tablet by mouth daily.     cholecalciferol (VITAMIN D) 25 MCG (1000 UNIT) tablet Take 1,000 Units by mouth daily.     famotidine (PEPCID) 20 MG tablet Take 1 tablet (20 mg total) by mouth daily. 30 tablet 0   Multiple Vitamin (MULTIVITAMIN) capsule Take 1 capsule by mouth daily. Centrum Silver     polyethylene glycol (MIRALAX / GLYCOLAX) 17 g packet Take 17 g by mouth daily as needed for moderate constipation. [place]  each 0   vitamin B-12 (CYANOCOBALAMIN) 1000 MCG tablet Take 1,000 mcg by mouth daily.     No current facility-administered medications for this visit.    SURGICAL HISTORY:  Past Surgical History:  Procedure Laterality Date   ANKLE FRACTURE SURGERY Left 10 years ago   "hard to wake up from anesthesia" per pt   APPLICATION OF CRANIAL NAVIGATION Right 11/07/2020   Procedure: APPLICATION OF CRANIAL NAVIGATION;  Surgeon: Earnie Larsson, MD;  Location: [facility];  Service: Neurosurgery;  Laterality: Right;   [place]   BREAST BIOPSY Right 1978   BENIGN CYST   BREAST EXCISIONAL BIOPSY Left 2011   Benign   BREAST EXCISIONAL BIOPSY Right 1985   Benign    CATARACT EXTRACTION     CATARACT EXTRACTION W/ INTRAOCULAR LENS IMPLANT Bilateral    COLONOSCOPY  2011   CRANIOTOMY Right 11/07/2020   Procedure: Right Sided Craniotomy for Tumor;  Surgeon: Earnie Larsson, MD;  Location: [facility];  Service: Neurosurgery;  Laterality: Right;   ELECTROMAGNETIC NAVIGATION BROCHOSCOPY N/A 06/08/2017   Procedure: ELECTROMAGNETIC NAVIGATION BRONCHOSCOPY;  Surgeon: Flora Lipps, MD;  Location: [facility];  Service: Cardiopulmonary;  Laterality: N/A;   FRACTURE SURGERY     POLYPECTOMY  2011   TRANSURETHRAL RESECTION OF BLADDER TUMOR WITH MITOMYCIN-C N/A 05/04/2017   Procedure: TRANSURETHRAL RESECTION OF BLADDER TUMOR WITH gemcitabine;  Surgeon: Abbie Sons, MD;  Location: [facility];  Service: Urology;  Laterality: N/A;   TUBAL LIGATION      REVIEW OF SYSTEMS:  A comprehensive review of systems was negative except for: Constitutional: positive for fatigue Cardiovascular: positive for tachycardic    PHYSICAL EXAMINATION: General appearance: alert, cooperative, fatigued, and no distress Head: Normocephalic, without obvious abnormality, atraumatic Neck: no adenopathy, no JVD, supple, symmetrical, trachea midline, and thyroid not enlarged, symmetric, no tenderness/mass/nodules Lymph nodes: Cervical, supraclavicular, and axillary nodes normal. Resp: clear to auscultation bilaterally Back: symmetric, no curvature. ROM normal. No CVA tenderness. Cardio: Tachycardic GI: soft, non-tender; bowel sounds normal; no masses,  no organomegaly Extremities: extremities normal, atraumatic, no cyanosis or edema  ECOG PERFORMANCE STATUS: 1 - Symptomatic but completely ambulatory  Blood pressure 134/61, pulse (!) 125, temperature (!) 96.8 F (36 C), temperature source Tympanic, resp. rate 18, height 4\' 11"  (1.499 m), weight  133 lb 14.4 oz (60.7 kg), SpO2 96 %.  LABORATORY DATA: Lab Results  Component Value Date   WBC 10.3 04/28/2021   HGB 11.7 (L) 04/28/2021   HCT 36.1 04/28/2021   MCV 92.3 04/28/2021   PLT 330 04/28/2021      Chemistry      Component Value Date/Time   NA 137 04/02/2021 0239   K 4.0 04/02/2021 0239   CL 106 04/02/2021 0239   CO2 26 04/02/2021 0239   BUN 19 04/02/2021 0239   CREATININE 0.62 04/02/2021 0239   CREATININE 0.73 12/24/2020 1425      Component Value Date/Time   CALCIUM 8.6 (L) 04/02/2021 0239   ALKPHOS 65 04/01/2021 0426   AST 30 04/01/2021 0426   AST 18 12/24/2020 1425   ALT 57 (H) 04/01/2021 0426   ALT 19 12/24/2020 1425   BILITOT 0.7 04/01/2021 0426   BILITOT 0.4 12/24/2020 1425       RADIOGRAPHIC STUDIES: CT HEAD WO CONTRAST  Result Date: 03/31/2021 CLINICAL DATA:  Provided history: Head trauma, minor. Additional history provided: Mechanical fall last night. EXAM: CT HEAD WITHOUT CONTRAST TECHNIQUE: Contiguous axial images were obtained from the base  of the skull through the vertex without intravenous contrast. COMPARISON:  Brain MRI 03/06/2021.  Head CT 03/05/2021. FINDINGS: Brain: Cerebral volume is normal. Redemonstrated sequela of prior right parietal craniotomy and postsurgical changes within the underlying posterior right frontal lobe. The patient has multiple known hemorrhagic parenchymal metastases within the bilateral frontal lobes, as well as fairly extensive hemorrhagic dural-based metastatic disease along the falx and overlying the right cerebral hemisphere. Multifocal hemorrhage associated with the metastatic lesions has decreased or remained stable since the prior head CT of 03/05/2021. Similarly, sites of vasogenic edema in the right frontal lobe have remained stable or decreased as compared to the prior examination. Most notably, there is a persistent focus of moderate vasogenic edema within the anterior right frontal lobe and callosal genu. Small  foci of edema within the right parietooccipital lobes have not significantly changed. Within the limitations of a noncontrast head CT, no new sites of intracranial metastatic disease are identified. Mass effect has decreased. There is now 3 mm leftward midline shift measured at the level of the septum pellucidum (previously 8 mm). No demarcated cortical infarct. Vascular: Maintained flow voids within the proximal large arterial vessels. Atherosclerotic calcifications. Skull: Right parietal cranioplasty. No calvarial fracture. No focal suspicious osseous lesion. Sinuses/Orbits: Visualized orbits show no acute finding. No significant paranasal sinus disease. IMPRESSION: Known multifocal intracranial metastatic disease with both parenchymal and dural-based lesions. Hemorrhage associated with the intracranial metastases has remained stable or decreased when compared with the prior head CT of 03/05/2021. Similarly, multifocal vasogenic edema associated with these lesions has decreased or remained stable. Most notably, there is a focus of moderate residual vasogenic edema within the anterior right frontal lobe. Mass effect has decreased, now with 3 mm leftward midline shift (previously 8 mm). Within the limitations of a noncontrast head CT, no new intracranial metastasis is identified. Redemonstrated sequela of prior right parietal calvarium and postsurgical changes in the underlying posterior right frontal lobe. Electronically Signed   By: Kellie Simmering D.O.   On: 03/31/2021 15:15   CT CERVICAL SPINE WO CONTRAST  Result Date: 03/31/2021 CLINICAL DATA:  Neck trauma (Age >= 65y) EXAM: CT CERVICAL SPINE WITHOUT CONTRAST TECHNIQUE: Multidetector CT imaging of the cervical spine was performed without intravenous contrast. Multiplanar CT image reconstructions were also generated. COMPARISON:  None. FINDINGS: Alignment: Mild reversal of the normal cervical lordosis. Slight anterolisthesis of C3 on C4, favor degenerative  given facet arthropathy at this level. Skull base and vertebrae: Vertebral body heights are maintained. No evidence of acute fracture. Bony demineralization Soft tissues and spinal canal: No prevertebral fluid or swelling. No visible canal hematoma. Disc levels: Moderate degenerative disease at C5-C6 and C6-C7 where there is disc height loss, endplate sclerosis and posterior disc osteophyte complex. Multilevel facet arthropathy. Upper chest: Similar size of an 8 mm solid/subsolid pulmonary nodule in the right upper lobe, better characterized on prior CT chest from 11/11/2020. Other: Bilateral thyroid nodules, better characterized on ultrasound of the thyroid from 04/26/2019 (ref: J Am Coll Radiol. 2015 Feb;12(2): 143-50). IMPRESSION: 1. No evidence of acute fracture or traumatic malalignment. 2. Multilevel degenerative change, detailed above. 3. Similar size of an 8 mm solid/subsolid pulmonary nodule in the right upper lobe, better characterized on prior CT chest. Recommend continued attention on follow-up imaging. Electronically Signed   By: Margaretha Sheffield M.D.   On: 03/31/2021 15:00   DG Pelvis Portable  Result Date: 03/31/2021 CLINICAL DATA:  [place] today EXAM: PORTABLE PELVIS 1-2 VIEWS COMPARISON:  None. FINDINGS: No  evidence of regional fracture. There is osteoarthritis of the right hip joint. There is lower lumbar degenerative change. IMPRESSION: Normal except for chronic osteoarthritis of the hip joint and lower lumbar degenerative change. No acute or traumatic finding. Electronically Signed   By: Nelson Chimes M.D.   On: 03/31/2021 13:17   DG Chest Port 1 View  Result Date: 03/31/2021 CLINICAL DATA:  Fall EXAM: PORTABLE CHEST 1 VIEW COMPARISON:  Chest radiographs, 01/19/2012, CT chest, 8,002 FINDINGS: The heart size and mediastinal contours are within normal limits. Bandlike scarring and fibrosis of the perihilar right lung. The visualized skeletal structures are unremarkable. IMPRESSION: No acute  abnormality of the lungs. Bandlike scarring and fibrosis of the perihilar right lung, unchanged compared to prior CT. Electronically Signed   By: Delanna Ahmadi M.D.   On: 03/31/2021 13:17     ASSESSMENT AND PLAN: This is a very pleasant 76 year old white female with now metastatic non-small cell lung cancer, adenocarcinoma initially diagnosed as stage IIIA non-small cell lung cancer, adenocarcinoma presented mainly with right lower lobe lung mass in addition to subcarinal lymphadenopathy but there was suspicious metastatic pulmonary nodules.  She has metastatic disease to the brain in July 2022. The patient completed a course of concurrent chemoradiation with weekly carboplatin and paclitaxel.  She tolerated her treatment well with no concerning complaints. The patient recently completed consolidation treatment with immunotherapy with Imfinzi (Durvalumab). She is status post 26 cycles. She tolerated it well without any adverse effects.  The patient underwent craniotomy and surgical resection of the solitary brain metastasis under the care of Dr. Trenton Gammon and the final pathology was consistent with metastatic adenocarcinoma of the lung. She had SRS to the resection cavity of the brain tumor. The patient is currently on observation and she is feeling fine.  She was supposed to have repeat CT scan of the chest before this visit but unfortunately it was not scheduled as ordered.  We will try to get her scan done this week and if no evidence for disease recurrence or metastasis, I will see her back for follow-up visit in 4 months for evaluation and repeat CT scan of the chest. For the tachycardia, will recheck her heart rate after resting and if it still elevated, will do an EKG to rule out any other abnormality. The patient was advised to call immediately if she has any other concerning symptoms in the interval.  All questions were answered. The patient knows to call the clinic with any problems, questions or  concerns. We can certainly see the patient much sooner if necessary.  Disclaimer: This note was dictated with voice recognition software. Similar sounding words can inadvertently be transcribed and may not be corrected upon review.

## 2021-04-29 ENCOUNTER — Telehealth: Payer: Self-pay | Admitting: Family Medicine

## 2021-04-29 DIAGNOSIS — C7931 Secondary malignant neoplasm of brain: Secondary | ICD-10-CM | POA: Diagnosis not present

## 2021-04-29 DIAGNOSIS — M1990 Unspecified osteoarthritis, unspecified site: Secondary | ICD-10-CM | POA: Diagnosis not present

## 2021-04-29 DIAGNOSIS — E785 Hyperlipidemia, unspecified: Secondary | ICD-10-CM | POA: Diagnosis not present

## 2021-04-29 DIAGNOSIS — E1169 Type 2 diabetes mellitus with other specified complication: Secondary | ICD-10-CM | POA: Diagnosis not present

## 2021-04-29 DIAGNOSIS — I10 Essential (primary) hypertension: Secondary | ICD-10-CM | POA: Diagnosis not present

## 2021-04-29 DIAGNOSIS — Z87891 Personal history of nicotine dependence: Secondary | ICD-10-CM | POA: Diagnosis not present

## 2021-04-29 DIAGNOSIS — C3431 Malignant neoplasm of lower lobe, right bronchus or lung: Secondary | ICD-10-CM | POA: Diagnosis not present

## 2021-04-29 DIAGNOSIS — I951 Orthostatic hypotension: Secondary | ICD-10-CM | POA: Diagnosis not present

## 2021-04-29 DIAGNOSIS — E1151 Type 2 diabetes mellitus with diabetic peripheral angiopathy without gangrene: Secondary | ICD-10-CM | POA: Diagnosis not present

## 2021-04-29 DIAGNOSIS — S065X0D Traumatic subdural hemorrhage without loss of consciousness, subsequent encounter: Secondary | ICD-10-CM | POA: Diagnosis not present

## 2021-04-29 DIAGNOSIS — Z86718 Personal history of other venous thrombosis and embolism: Secondary | ICD-10-CM | POA: Diagnosis not present

## 2021-04-29 DIAGNOSIS — W19XXXD Unspecified fall, subsequent encounter: Secondary | ICD-10-CM | POA: Diagnosis not present

## 2021-04-29 DIAGNOSIS — E119 Type 2 diabetes mellitus without complications: Secondary | ICD-10-CM | POA: Diagnosis not present

## 2021-04-29 NOTE — Telephone Encounter (Signed)
Home Health verbal orders Caller Name: [place] Name: advance  [place] Master number: [phone]  Requesting OT/PT/Skilled nursing/Social Work/Speech: PT  Reason: had a fall , balance gait training  Frequency: 2w2 1w2   Please forward to [facility] pool or providers CMA    Also she stated that her heart rate was elevated it ran between 120-130 at rest

## 2021-04-29 NOTE — Telephone Encounter (Signed)
I see recent labs show normal creatinine and normal potassium.   Have her restart the same dose for renal protection given past microalbuminuria.

## 2021-04-29 NOTE — Telephone Encounter (Signed)
Katelyn Kennedy notified as instructed by telephone.  Patient states understanding.

## 2021-04-29 NOTE — Telephone Encounter (Signed)
Patient saw Oncology yesterday... HR ranges 103-125 in recent OVs.

## 2021-04-29 NOTE — Telephone Encounter (Signed)
Left message for Katelyn Kennedy giving her verbal orders for PT 2 x week for 2 weeks then 1 x week for 2 weeks.  Will forward message to [name] to review note about heart rate.

## 2021-04-29 NOTE — Telephone Encounter (Signed)
Spoke with Ms. Gledhill.  She states she has not been checking her blood pressure because it is usually always good.  She states Dr. Randa Spike put her on the lisinopril for kidney protection.  She was seen yesterday at the oncology office and her BP was 134/61 and pulse was 125.  Please advise.

## 2021-04-30 NOTE — Progress Notes (Signed)
Radiation Oncology         [phone]) [phone] ________________________________  Name: Katelyn Kennedy MRN: [id number]  Date: 05/01/2021  DOB: May 10, 1945  Follow-Up Visit Note  CC: [name], MD  [name], MD    ICD-10-CM   1. 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 A diagnosis of Brain metastasis ([place]) was also pertinent to this visit.  2) Interval Since Last Radiation: 1 month and 12 days    Intent: Palliative  Radiation Treatment Dates: 03/10/2021 through 03/19/2021 Site Technique Total Dose (Gy) Dose per Fx (Gy) Completed Fx Beam Energies  Brain: Brain 3D 24/24 3 8/8 6X   1) Radiation treatment dates:   06/28/2017 - 08/11/2017  Indication for treatment::  curative    Site/dose:   The patient was treated to the disease within the right lung initially to a dose of 60 Gy using a 4 field, 3-D conformal technique. The patient then received a cone down boost treatment for an additional 6 Gy. This yielded a final total dose of 66 Gy.   Narrative:  The patient returns today for routine follow-up.  The patient received whole brain RT while admitted on 03/05/21 through 03/19/21. To review, the patient presented to the ED on 03/05/21 with left sided weakness and an altered mental status. CT of the head on 03/05/21 demonstrated marked progression of metastatic lung cancer, defined by multiple new hemorrhagic metastatic lesions. Significant right frontal vasogenic edema was also appreciated which resulted in a leftward midline shift measuring approximately 0.8 cm.  MRI of the brain on 03/06/21 again showed marked progression of intracranial metastatic disease since imaging on 11/27/2020. Progression on MRI was exhibited largely by dural spread of tumor over the  right cerebral hemisphere, with some intraparenchymal components, most notably in the anterior right frontal lobe which displayed vasogenic edema, and 1.3 cm of leftward midline shift (measured at 0.8 cm on CT). MRI also showed a possible small volume subarachnoid and subdural hemorrhage. Following these findings, neurosurgery was consulted and recommended close monitoring, decadron 40m q 6 hrs, and Keppra for seizure prophylaxis (no intervention was recommended by neurosurgery). The patient completed 7 days of therapy in total.   Of note:  LE DVT study performed while the patient was admitted on 03/08/21 which showed no evidence of DVT in the lower extremities, bilaterally.   The patient tolerated radiation treatment relatively well other than reports of memory issues, increased fatigue, floaters in her right eye, and hair loss. During her final weekly treatment check on 03/18/21, she exhibited some mild confusion, but responded appropriately to questions. She denied any nausea or headaches.    She did not wish to complete her last 2 whole brain treatments after she is discharged from the hospital.  She experienced significant improvement with her confusion and neurological issues with her emergent whole brain radiation therapy  During follow up with AJobe GibbonNP on 03/28/21, the patient reported feeling much better, and capable of doing more around her home than she has been able to do in months, including ADL's. She also stated that she felt much stronger and has been overcoming the fatigue that she encountered with radiation.  Unfortunately, the patient returned to the ED  on 03/31/21 due to a fall and weakness. The patient stated that that she attempted to walk to the bathroom using her walker which rolled out from under her resulting in the fall.  Due to weakness following her fall, she was unable to get up for the remainder of the morning. Luckily her neighbor came by to check on her  that morning and found her and contacted EMS.  Upon arrival to the ED, she denied any associated slurred speech or visual changes.  She did report striking her head on a basket that was beside her when she fell. CT imaging of the head performed during ED course redemonstrated the multifocal intracranial metastatic disease, and revealed stable/decreased hemorrhages with decreasing vasogenic edema and reduced leftward midline shift of 3 mm.  Urinalysis revealed no evidence of infection.  Chest x-ray revealed no evidence of cardiopulmonary disease. Patient was administered a 500 cc LR bolus.  Due to concerns for patient's safety and ongoing weakness, she was admitted for observation. She was seen by PT OT who recommended SNF appropriately which she was eventually cleared by insurance for. She was discharged on 04/04/21 following observation.   Of note: CT of the cervical spine performed on 03/31/21 also showed the similar size of an 8 mm solid/ sub-solid pulmonary nodule in the right upper lobe  In more recent history, the patient met with Dr. Julien Nordmann on 04/28/20 for repeat blood work and routine follow up. During which time, the patient reported feeling well with no concerning complaints except for fatigue and tachycardia. The patient was supposed to have a repeat chest CT done prior to this visit though it was never scheduled. Accordingly, Dr. Julien Nordmann ordered imaging to assess metastatic disease (scheduled for 05/08/21). If there is no evidence of metastatic disease, the patient will return to Dr. Julien Nordmann in 4 months for routine follow up.               Allergies:  is allergic to codeine.  Meds: Current Outpatient Medications  Medication Sig Dispense Refill   amitriptyline (ELAVIL) 50 MG tablet Take 1 tablet (50 mg total) by mouth at bedtime. 90 tablet 3   atorvastatin (LIPITOR) 40 MG tablet 1 tablet  po daily (Patient taking differently: Take 40 mg by mouth daily.) 90 tablet 3   Calcium Citrate-Vitamin  D (CITRACAL + D PO) Take 1 tablet by mouth daily.     cholecalciferol (VITAMIN D) 25 MCG (1000 UNIT) tablet Take 1,000 Units by mouth daily.     famotidine (PEPCID) 20 MG tablet Take 1 tablet (20 mg total) by mouth daily. 30 tablet 0   lisinopril (ZESTRIL) 2.5 MG tablet Take 2.5 mg by mouth daily.     Multiple Vitamin (MULTIVITAMIN) capsule Take 1 capsule by mouth daily. Centrum Silver     polyethylene glycol (MIRALAX / GLYCOLAX) 17 g packet Take 17 g by mouth daily as needed for moderate constipation. 14 each 0   vitamin B-12 (CYANOCOBALAMIN) 1000 MCG tablet Take 1,000 mcg by mouth daily.     levETIRAcetam (KEPPRA) 500 MG tablet Take 1 tablet (500 mg total) by mouth 2 (two) times daily. 60 tablet 3   No current facility-administered medications for this encounter.    Physical Findings: The patient is in no acute distress. Patient is alert and oriented.  height is _0  (1.499 m) and weight is 135 lb 8 oz (61.5 kg). Her temporal temperature is 97.1 F (36.2 C) (abnormal). Her blood pressure is 135/76 and her pulse  is 129 (abnormal). Her respiration is 18 and oxygen saturation is 98%. .  Lungs are clear to auscultation bilaterally. Heart has regular rate and rhythm. No palpable cervical, supraclavicular, or axillary adenopathy. Abdomen soft, non-tender, normal bowel sounds.  Lower motor strength in the proximal and distal muscle groups appear symmetric possibly some slight weakness in the left lower extremity.  She ambulates with the assistance of a rolling walker.  She lives at home but does have assistance coming in during the daytime.   Lab Findings: Lab Results  Component Value Date   WBC 10.3 04/28/2021   HGB 11.7 (L) 04/28/2021   HCT 36.1 04/28/2021   MCV 92.3 04/28/2021   PLT 330 04/28/2021    Radiographic Findings: No results found.  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 A diagnosis of Brain metastasis  ([place]) was also pertinent to this visit.  The patient is recovering from the effects of radiation.  Significant improvement in her neurological function with her palliative whole brain radiation therapy.  Plan: The patient will be meeting with Dr. Mickeal Skinner later today.  She reported to me that she had stopped her Keppra and she will likely need to restart this medication.  Dr. Mickeal Skinner will be following the patient along with Dr. Julien Nordmann and ordering her brain MRI.  In light of this I will not schedule her for formal follow-up appointment.   _______________________  Blair Promise, PhD, MD  This document serves as a record of services personally performed by Gery Pray, MD. It was created on his behalf by Roney Mans, a trained medical scribe. The creation of this record is based on the scribe's personal observations and the provider's statements to them. This document has been checked and approved by the attending provider.

## 2021-04-30 NOTE — Progress Notes (Incomplete)
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 A diagnosis of Brain metastasis ([place]) was also pertinent to this visit.  Intent: Palliative  Radiation Treatment Dates: 03/10/2021 through 03/19/2021 Site Technique Total Dose (Gy) Dose per Fx (Gy) Completed Fx Beam Energies  Brain: Brain 3D 24/24 3 8/8 6X   Narrative: The patient tolerated radiation therapy relatively well. She reports memory issues, increased fatigue, floaters in her right eye,and hair loss. She exhibits some mild confusion but responds appropriately to questions. She denies any nausea or headaches.    Her current decadron dose is 4mg  every 6 hours. She will need a decadron taper when she is discharged.   Plan: The patient will follow-up with radiation oncology in one month .  ________________________________________________ -----------------------------------  Blair Promise, PhD, MD  This document serves as a record of services personally performed by Gery Pray, MD. It was created on his behalf by Roney Mans, a trained medical scribe. The creation of this record is based on the scribe's personal observations and the provider's statements to them. This document has been checked and approved by the attending provider.

## 2021-05-01 ENCOUNTER — Inpatient Hospital Stay (HOSPITAL_BASED_OUTPATIENT_CLINIC_OR_DEPARTMENT_OTHER): Payer: PPO | Admitting: Internal Medicine

## 2021-05-01 ENCOUNTER — Ambulatory Visit: Payer: PPO | Admitting: Internal Medicine

## 2021-05-01 ENCOUNTER — Ambulatory Visit
Admission: RE | Admit: 2021-05-01 | Discharge: 2021-05-01 | Disposition: A | Discharge status: Home / Self Care | Payer: PPO | Source: Ambulatory Visit | Attending: Radiation Oncology | Admitting: Radiation Oncology

## 2021-05-01 ENCOUNTER — Encounter: Payer: Self-pay | Admitting: Radiation Oncology

## 2021-05-01 ENCOUNTER — Other Ambulatory Visit: Payer: Self-pay

## 2021-05-01 ENCOUNTER — Telehealth: Payer: Self-pay | Admitting: Family Medicine

## 2021-05-01 VITALS — BP 135/76 | HR 129 | Temp 97.1°F | Resp 18 | Ht 59.0 in | Wt 135.5 lb

## 2021-05-01 VITALS — BP 121/64 | HR 115 | Temp 97.5°F | Resp 18 | Wt 135.9 lb

## 2021-05-01 DIAGNOSIS — I951 Orthostatic hypotension: Secondary | ICD-10-CM | POA: Diagnosis not present

## 2021-05-01 DIAGNOSIS — E1169 Type 2 diabetes mellitus with other specified complication: Secondary | ICD-10-CM | POA: Diagnosis not present

## 2021-05-01 DIAGNOSIS — C3431 Malignant neoplasm of lower lobe, right bronchus or lung: Secondary | ICD-10-CM | POA: Diagnosis not present

## 2021-05-01 DIAGNOSIS — Z923 Personal history of irradiation: Secondary | ICD-10-CM | POA: Diagnosis not present

## 2021-05-01 DIAGNOSIS — W19XXXD Unspecified fall, subsequent encounter: Secondary | ICD-10-CM | POA: Diagnosis not present

## 2021-05-01 DIAGNOSIS — Z87891 Personal history of nicotine dependence: Secondary | ICD-10-CM | POA: Diagnosis not present

## 2021-05-01 DIAGNOSIS — C3490 Malignant neoplasm of unspecified part of unspecified bronchus or lung: Secondary | ICD-10-CM

## 2021-05-01 DIAGNOSIS — C7931 Secondary malignant neoplasm of brain: Secondary | ICD-10-CM | POA: Insufficient documentation

## 2021-05-01 DIAGNOSIS — Z86718 Personal history of other venous thrombosis and embolism: Secondary | ICD-10-CM | POA: Insufficient documentation

## 2021-05-01 DIAGNOSIS — Z79899 Other long term (current) drug therapy: Secondary | ICD-10-CM | POA: Insufficient documentation

## 2021-05-01 DIAGNOSIS — E785 Hyperlipidemia, unspecified: Secondary | ICD-10-CM | POA: Diagnosis not present

## 2021-05-01 DIAGNOSIS — E1151 Type 2 diabetes mellitus with diabetic peripheral angiopathy without gangrene: Secondary | ICD-10-CM | POA: Diagnosis not present

## 2021-05-01 DIAGNOSIS — E119 Type 2 diabetes mellitus without complications: Secondary | ICD-10-CM | POA: Diagnosis not present

## 2021-05-01 DIAGNOSIS — S065X0D Traumatic subdural hemorrhage without loss of consciousness, subsequent encounter: Secondary | ICD-10-CM | POA: Diagnosis not present

## 2021-05-01 DIAGNOSIS — R41841 Cognitive communication deficit: Secondary | ICD-10-CM | POA: Diagnosis not present

## 2021-05-01 DIAGNOSIS — M1990 Unspecified osteoarthritis, unspecified site: Secondary | ICD-10-CM | POA: Diagnosis not present

## 2021-05-01 DIAGNOSIS — I10 Essential (primary) hypertension: Secondary | ICD-10-CM | POA: Diagnosis not present

## 2021-05-01 MED ORDER — LEVETIRACETAM 500 MG PO TABS: 500.0000 mg | ORAL_TABLET | Freq: Two times a day (BID) | ORAL | 3 refills | Status: AC

## 2021-05-01 NOTE — Telephone Encounter (Signed)
Verbal orders given to [facility] via telephone for speech therapy 1 x week for 4 weeks.

## 2021-05-01 NOTE — Progress Notes (Signed)
Katelyn Kennedy is here today for follow up post radiation to the lung.  Lung Side: right lower lobe  Completed radiation treatment on: 08/11/2017  Does the patient complain of any of the following: Pain:denies Shortness of breath w/wo exertion: shortness of breath with exertion Cough: occasional productive cough with off white/cream phlegm Hemoptysis: denies Pain with swallowing: denies Swallowing/choking concerns: denies Appetite: good Energy Level: occasional fatigue and weakness   [name] is here today for follow up post radiation to the brain.    They completed their radiation on: 12/04/2020  Does the patient complain of any of the following: Headache: denies Visual Changes: denies Hearing Changes: denies Nausea: occasional nausea that resolves without intervention Vomiting: denies Balance or coordination issues: denies Memory issues: mild memory changes after tumor removed from brain.  Is the patient currently on a Decadron regimen? : no  Additional comments if applicable:  none  Vitals:   05/01/21 1017  BP: 135/76  Pulse: (!) 129  Resp: 18  Temp: (!) 97.1 F (36.2 C)  TempSrc: Temporal  SpO2: 98%  Weight: 135 lb 8 oz (61.5 kg)  Height: 4\' 11"  (1.499 m)

## 2021-05-01 NOTE — Telephone Encounter (Signed)
Home Health verbal orders Caller Name: [place] Name: advance  [place] Master number: [phone]  Requesting OT/PT/Skilled nursing/Social Work/Speech: speech  Reason: to address cognition   Frequency: 1w4  Please forward to [facility] pool or providers CMA

## 2021-05-01 NOTE — Progress Notes (Signed)
[facility] at [facility] [place], [place] [address] [phone]   New Patient Evaluation  Date of Service: 05/01/21 Patient Name: Katelyn Kennedy Patient MRN: [id number] Patient DOB: 02-Mar-1946 Provider: Ventura Sellers, MD  Identifying Statement:  Katelyn Kennedy is a 76 y.o. female with Non-small cell lung cancer metastatic to brain [facility]) - Plan: MR [place] who presents for initial consultation and evaluation regarding cancer associated neurologic deficits.    Referring Provider: [name], MD [place],  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 ([place])   09/21/2017 -  Chemotherapy   The patient had durvalumab (IMFINZI) 600 mg in sodium chloride 0.9 % 100 mL chemo infusion, 10.5 mg/kg = 560 mg, Intravenous,  Once, 26 of 26 cycles Administration: 600 mg (09/21/2017), 600 mg (12/16/2017), 620 mg (12/30/2017), 620 mg (01/13/2018), 620 mg (01/27/2018), 620 mg (02/10/2018), 620 mg (02/23/2018), 600 mg (10/07/2017), 620 mg (03/09/2018), 600 mg (10/21/2017), 600 mg (11/04/2017), 600 mg (11/18/2017), 600 mg (12/02/2017), 620 mg (03/24/2018), 620 mg (04/05/2018), 620 mg (05/04/2018), 620 mg (04/21/2018), 620 mg (05/18/2018), 620 mg (06/01/2018), 620 mg (06/15/2018), 620 mg (06/29/2018), 620 mg (07/13/2018), 620 mg (07/27/2018), 620 mg (08/11/2018), 620 mg (08/24/2018), 620 mg (09/08/2018)   for chemotherapy treatment.     05/14/2020 Cancer Staging   Staging form: Lung, AJCC 8th Edition - Clinical: Stage IIIA (cT2a, cN2, cM0) - Signed by Curt Bears, MD on 05/14/2020     CNS Oncologic History 11/07/20: Craniotomy, resection L convexity dural based met (Pool) 12/04/20: Post-op SRS Tammi Klippel) 03/19/21: Infiltrative recurrence, completes WBRT  ([place])  History of Present Illness: The patient's records from the referring physician were obtained and reviewed and the patient interviewed to confirm this HPI.  Katelyn Kennedy presents today for clinical follow up.  She describes improvement in left sided weakness and dysfunction since completing whole brain radiation in December.  She currently is ambulating mostly with a rolling walker.  Other care is fully independent, and she continues to live alone.  Working well with physical therapy.  No recurrence of episodes of left leg stiffening and shaking described previously.  Medications: Current Outpatient Medications on File Prior to Visit  Medication Sig Dispense Refill   amitriptyline (ELAVIL) 50 MG tablet Take 1 tablet (50 mg total) by mouth at bedtime. 90 tablet 3   atorvastatin (LIPITOR) 40 MG tablet 1 tablet  po daily (Patient taking differently: Take 40 mg by mouth daily.) 90 tablet 3   Calcium Citrate-Vitamin D (CITRACAL + D PO) Take 1 tablet by mouth daily.     cholecalciferol (VITAMIN D) 25 MCG (1000 UNIT) tablet Take 1,000 Units by mouth daily.     famotidine (PEPCID) 20 MG tablet Take 1 tablet (20 mg total) by mouth daily. 30 tablet 0   lisinopril (ZESTRIL) 2.5 MG tablet Take 2.5 mg by mouth daily.     Multiple Vitamin (MULTIVITAMIN) capsule Take 1 capsule by mouth daily. Centrum Silver     polyethylene glycol (MIRALAX / GLYCOLAX) 17 g packet Take 17 g by mouth daily as needed for moderate constipation. 14 each 0   vitamin B-12 (CYANOCOBALAMIN) 1000 MCG tablet Take 1,000 mcg by mouth daily.     No current facility-administered medications on file prior to  visit.    Allergies:  Allergies  Allergen Reactions   Codeine Nausea Only   Past Medical History:  Past Medical History:  Diagnosis Date   Allergic rhinitis    Arthritis    Diabetes mellitus without complication (HCC)    diet control/no meds per pt   Dyspnea    Essential hypertension 04/11/2019   History of  chicken pox    History of hiatal hernia    History of radiation therapy    right lung - 06/28/2017-08/11/2017  Dr Kyung Rudd   History of radiation therapy    SRS brain - 11/29/2020-12/04/2020  Dr Jenny Reichmann [place]/Matthew Tammi Klippel   History of radiation therapy    Whole brain - 03/10/2021-03/19/2021 Dr Gery Pray   lung ca dx'd 04/2017   Peripheral vascular disease ([place])    Pneumonia    PONV (postoperative nausea and vomiting)    Smoker    Past Surgical History:  Past Surgical History:  Procedure Laterality Date   ANKLE FRACTURE SURGERY Left 10 years ago   "hard to wake up from anesthesia" per pt   APPLICATION OF CRANIAL NAVIGATION Right 11/07/2020   Procedure: APPLICATION OF CRANIAL NAVIGATION;  Surgeon: Earnie Larsson, MD;  Location: [facility];  Service: Neurosurgery;  Laterality: Right;   [place]   BREAST BIOPSY Right 1978   BENIGN CYST   BREAST EXCISIONAL BIOPSY Left 2011   Benign   BREAST EXCISIONAL BIOPSY Right 1985   Benign    CATARACT EXTRACTION     CATARACT EXTRACTION W/ INTRAOCULAR LENS IMPLANT Bilateral    COLONOSCOPY  2011   CRANIOTOMY Right 11/07/2020   Procedure: Right Sided Craniotomy for Tumor;  Surgeon: Earnie Larsson, MD;  Location: [facility];  Service: Neurosurgery;  Laterality: Right;   ELECTROMAGNETIC NAVIGATION BROCHOSCOPY N/A 06/08/2017   Procedure: ELECTROMAGNETIC NAVIGATION BRONCHOSCOPY;  Surgeon: Flora Lipps, MD;  Location: [facility];  Service: Cardiopulmonary;  Laterality: N/A;   FRACTURE SURGERY     POLYPECTOMY  2011   TRANSURETHRAL RESECTION OF BLADDER TUMOR WITH MITOMYCIN-C N/A 05/04/2017   Procedure: TRANSURETHRAL RESECTION OF BLADDER TUMOR WITH gemcitabine;  Surgeon: Abbie Sons, MD;  Location: [facility];  Service: Urology;  Laterality: N/A;   TUBAL LIGATION     Social History:  Social History   Socioeconomic History   Marital status: Divorced    Spouse name: Not on file   Number of children: Not on file   Years of education: Not on file    Highest education level: Not on file  Occupational History   Not on file  Tobacco Use   Smoking status: Former    Packs/day: 0.50    Years: 40.00    Pack years: 20.00    Types: Cigarettes    Quit date: 04/29/2017    Years since quitting: 4.0   Smokeless tobacco: Never  Vaping Use   Vaping Use: Never used  Substance and Sexual Activity   Alcohol use: Not Currently    Comment: occassionally   Drug use: No   Sexual activity: Not Currently  Other Topics Concern   Not on file  Social History Narrative   Not on file   Social Determinants of Health   Financial Resource Strain: Low Risk    Difficulty of Paying Living Expenses: Not hard at all  Food Insecurity: Not on file  Transportation Needs: Not on file  Physical Activity: Not on file  Stress: Not on file  Social Connections: Not on file  Intimate  Partner Violence: Not on file   Family History:  Family History  Problem Relation Age of Onset   Diabetes Mother    Hypothyroidism Mother    Cancer Sister 10       BREAST   Breast cancer Sister    Hypothyroidism Sister    Cancer Maternal Grandmother        COLON   Colon cancer Maternal Grandmother 80   Stomach cancer Neg Hx     Review of Systems: Constitutional: Doesn't report fevers, chills or abnormal weight loss Eyes: Doesn't report blurriness of vision Ears, nose, mouth, throat, and face: Doesn't report sore throat Respiratory: Doesn't report cough, dyspnea or wheezes Cardiovascular: Doesn't report palpitation, chest discomfort  Gastrointestinal:  Doesn't report nausea, constipation, diarrhea GU: Doesn't report incontinence Skin: Doesn't report skin rashes Neurological: Per HPI Musculoskeletal: Doesn't report joint pain Behavioral/Psych: Doesn't report anxiety  Physical Exam: Vitals:   05/01/21 1107  BP: 121/64  Pulse: (!) 115  Resp: 18  Temp: (!) 97.5 F (36.4 C)  SpO2: 97%   KPS: 70. General: Alert, cooperative, pleasant, in no acute  distress Head: Normal EENT: No conjunctival injection or scleral icterus.  Lungs: Resp effort normal Cardiac: Regular rate Abdomen: Non-distended abdomen Skin: No rashes cyanosis or petechiae. Extremities: No clubbing or edema  Neurologic Exam: Mental Status: Awake, alert, attentive to examiner. Oriented to self and environment. Language is fluent with intact comprehension.  Cranial Nerves: Visual acuity is grossly normal. Visual fields are full. Extra-ocular movements intact. No ptosis. Face is symmetric Motor: Tone and bulk are normal. Left leg 4/5. Reflexes are symmetric, no pathologic reflexes present.  Sensory: Intact to light touch Gait: Hemiparetic   Labs: I have reviewed the data as listed    Component Value Date/Time   NA 138 04/28/2021 1119   K 4.0 04/28/2021 1119   CL 104 04/28/2021 1119   CO2 27 04/28/2021 1119   GLUCOSE 192 (H) 04/28/2021 1119   BUN 13 04/28/2021 1119   CREATININE 0.73 04/28/2021 1119   CALCIUM 9.6 04/28/2021 1119   PROT 6.9 04/28/2021 1119   ALBUMIN 3.5 04/28/2021 1119   AST 19 04/28/2021 1119   ALT 18 04/28/2021 1119   ALKPHOS 158 (H) 04/28/2021 1119   BILITOT 0.6 04/28/2021 1119   GFRNONAA >60 04/28/2021 1119   GFRAA >60 10/31/2019 1007   Lab Results  Component Value Date   WBC 10.3 04/28/2021   NEUTROABS 6.4 04/28/2021   HGB 11.7 (L) 04/28/2021   HCT 36.1 04/28/2021   MCV 92.3 04/28/2021   PLT 330 04/28/2021     Assessment/Plan Non-small cell lung cancer metastatic to brain [facility]) - Plan: MR BRAIN W WO CONTRAST  TRYNITY SKOUSEN is clinically stable today.  She continues to improve left sided motor function through aggressive PT.  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  Counseled regarding epilepsy safety today.  We spent twenty additional minutes teaching regarding the natural history, biology, and historical experience in the treatment of neurologic  complications of cancer.   We appreciate the opportunity to participate in the care of SMARIADELCARMEN CORELLA    We ask that SYAILINE BALLARDreturn to clinic in 2 months following next brain MRI, or sooner as needed.  All questions were answered. The patient knows to call the clinic with any problems, questions or concerns. No barriers to learning were detected.  The total time spent in the encounter was  40 minutes and more than 50% was on counseling and review of test results   Ventura Sellers, MD Medical Director of Neuro-Oncology [facility] at [place] 05/01/21 11:36 AM

## 2021-05-02 ENCOUNTER — Telehealth: Payer: Self-pay | Admitting: Internal Medicine

## 2021-05-02 NOTE — Telephone Encounter (Signed)
Scheduled per 1/19 los, pt has been called and sister in law confirmed appt

## 2021-05-06 DIAGNOSIS — Z9889 Other specified postprocedural states: Secondary | ICD-10-CM | POA: Diagnosis not present

## 2021-05-06 DIAGNOSIS — C3490 Malignant neoplasm of unspecified part of unspecified bronchus or lung: Secondary | ICD-10-CM | POA: Diagnosis not present

## 2021-05-06 DIAGNOSIS — C7931 Secondary malignant neoplasm of brain: Secondary | ICD-10-CM | POA: Diagnosis not present

## 2021-05-07 DIAGNOSIS — I951 Orthostatic hypotension: Secondary | ICD-10-CM | POA: Diagnosis not present

## 2021-05-07 DIAGNOSIS — E1151 Type 2 diabetes mellitus with diabetic peripheral angiopathy without gangrene: Secondary | ICD-10-CM | POA: Diagnosis not present

## 2021-05-07 DIAGNOSIS — M1990 Unspecified osteoarthritis, unspecified site: Secondary | ICD-10-CM | POA: Diagnosis not present

## 2021-05-07 DIAGNOSIS — E119 Type 2 diabetes mellitus without complications: Secondary | ICD-10-CM | POA: Diagnosis not present

## 2021-05-07 DIAGNOSIS — S065X0D Traumatic subdural hemorrhage without loss of consciousness, subsequent encounter: Secondary | ICD-10-CM | POA: Diagnosis not present

## 2021-05-07 DIAGNOSIS — C7931 Secondary malignant neoplasm of brain: Secondary | ICD-10-CM | POA: Diagnosis not present

## 2021-05-07 DIAGNOSIS — W19XXXD Unspecified fall, subsequent encounter: Secondary | ICD-10-CM | POA: Diagnosis not present

## 2021-05-07 DIAGNOSIS — E1169 Type 2 diabetes mellitus with other specified complication: Secondary | ICD-10-CM | POA: Diagnosis not present

## 2021-05-07 DIAGNOSIS — C3431 Malignant neoplasm of lower lobe, right bronchus or lung: Secondary | ICD-10-CM | POA: Diagnosis not present

## 2021-05-07 DIAGNOSIS — Z87891 Personal history of nicotine dependence: Secondary | ICD-10-CM | POA: Diagnosis not present

## 2021-05-07 DIAGNOSIS — I10 Essential (primary) hypertension: Secondary | ICD-10-CM | POA: Diagnosis not present

## 2021-05-07 DIAGNOSIS — E785 Hyperlipidemia, unspecified: Secondary | ICD-10-CM | POA: Diagnosis not present

## 2021-05-07 DIAGNOSIS — R41841 Cognitive communication deficit: Secondary | ICD-10-CM | POA: Diagnosis not present

## 2021-05-07 DIAGNOSIS — Z86718 Personal history of other venous thrombosis and embolism: Secondary | ICD-10-CM | POA: Diagnosis not present

## 2021-05-08 ENCOUNTER — Inpatient Hospital Stay (HOSPITAL_COMMUNITY)
Admission: RE | Admit: 2021-05-08 | Discharge: 2021-05-08 | Disposition: A | Discharge status: Home / Self Care | Payer: PPO | Source: Ambulatory Visit | Attending: Internal Medicine | Admitting: Internal Medicine

## 2021-05-08 ENCOUNTER — Telehealth: Payer: Self-pay | Admitting: Medical Oncology

## 2021-05-08 ENCOUNTER — Other Ambulatory Visit: Payer: Self-pay

## 2021-05-08 ENCOUNTER — Inpatient Hospital Stay (HOSPITAL_BASED_OUTPATIENT_CLINIC_OR_DEPARTMENT_OTHER): Payer: PPO | Admitting: Internal Medicine

## 2021-05-08 ENCOUNTER — Encounter (HOSPITAL_COMMUNITY): Payer: Self-pay

## 2021-05-08 ENCOUNTER — Inpatient Hospital Stay (HOSPITAL_COMMUNITY)
Admission: EM | Admit: 2021-05-08 | Discharge: 2021-05-12 | DRG: 176 | Disposition: A | Discharge status: Home Health | Payer: PPO | Source: Ambulatory Visit | Attending: Internal Medicine | Admitting: Internal Medicine

## 2021-05-08 VITALS — BP 116/52 | HR 114 | Temp 97.8°F | Resp 18 | Ht 59.0 in | Wt 137.3 lb

## 2021-05-08 DIAGNOSIS — Z9851 Tubal ligation status: Secondary | ICD-10-CM | POA: Diagnosis not present

## 2021-05-08 DIAGNOSIS — Z79899 Other long term (current) drug therapy: Secondary | ICD-10-CM

## 2021-05-08 DIAGNOSIS — E119 Type 2 diabetes mellitus without complications: Secondary | ICD-10-CM | POA: Diagnosis not present

## 2021-05-08 DIAGNOSIS — Z923 Personal history of irradiation: Secondary | ICD-10-CM

## 2021-05-08 DIAGNOSIS — I82452 Acute embolism and thrombosis of left peroneal vein: Secondary | ICD-10-CM | POA: Diagnosis not present

## 2021-05-08 DIAGNOSIS — E1169 Type 2 diabetes mellitus with other specified complication: Secondary | ICD-10-CM | POA: Diagnosis not present

## 2021-05-08 DIAGNOSIS — Z85118 Personal history of other malignant neoplasm of bronchus and lung: Secondary | ICD-10-CM | POA: Diagnosis not present

## 2021-05-08 DIAGNOSIS — I2699 Other pulmonary embolism without acute cor pulmonale: Secondary | ICD-10-CM

## 2021-05-08 DIAGNOSIS — I82432 Acute embolism and thrombosis of left popliteal vein: Secondary | ICD-10-CM | POA: Diagnosis not present

## 2021-05-08 DIAGNOSIS — R918 Other nonspecific abnormal finding of lung field: Secondary | ICD-10-CM | POA: Diagnosis not present

## 2021-05-08 DIAGNOSIS — Z8701 Personal history of pneumonia (recurrent): Secondary | ICD-10-CM

## 2021-05-08 DIAGNOSIS — I2609 Other pulmonary embolism with acute cor pulmonale: Secondary | ICD-10-CM

## 2021-05-08 DIAGNOSIS — M1990 Unspecified osteoarthritis, unspecified site: Secondary | ICD-10-CM | POA: Diagnosis not present

## 2021-05-08 DIAGNOSIS — E1151 Type 2 diabetes mellitus with diabetic peripheral angiopathy without gangrene: Secondary | ICD-10-CM | POA: Diagnosis present

## 2021-05-08 DIAGNOSIS — I10 Essential (primary) hypertension: Secondary | ICD-10-CM | POA: Diagnosis present

## 2021-05-08 DIAGNOSIS — G936 Cerebral edema: Secondary | ICD-10-CM | POA: Diagnosis not present

## 2021-05-08 DIAGNOSIS — Z885 Allergy status to narcotic agent status: Secondary | ICD-10-CM | POA: Diagnosis not present

## 2021-05-08 DIAGNOSIS — Z9221 Personal history of antineoplastic chemotherapy: Secondary | ICD-10-CM

## 2021-05-08 DIAGNOSIS — I619 Nontraumatic intracerebral hemorrhage, unspecified: Secondary | ICD-10-CM | POA: Diagnosis not present

## 2021-05-08 DIAGNOSIS — C3490 Malignant neoplasm of unspecified part of unspecified bronchus or lung: Secondary | ICD-10-CM | POA: Diagnosis present

## 2021-05-08 DIAGNOSIS — Z20822 Contact with and (suspected) exposure to covid-19: Secondary | ICD-10-CM | POA: Diagnosis present

## 2021-05-08 DIAGNOSIS — Z87891 Personal history of nicotine dependence: Secondary | ICD-10-CM

## 2021-05-08 DIAGNOSIS — E785 Hyperlipidemia, unspecified: Secondary | ICD-10-CM | POA: Diagnosis not present

## 2021-05-08 DIAGNOSIS — R911 Solitary pulmonary nodule: Secondary | ICD-10-CM | POA: Diagnosis not present

## 2021-05-08 DIAGNOSIS — C7931 Secondary malignant neoplasm of brain: Secondary | ICD-10-CM | POA: Diagnosis present

## 2021-05-08 DIAGNOSIS — I82402 Acute embolism and thrombosis of unspecified deep veins of left lower extremity: Secondary | ICD-10-CM | POA: Diagnosis not present

## 2021-05-08 DIAGNOSIS — I700 Atherosclerosis of aorta: Secondary | ICD-10-CM | POA: Diagnosis not present

## 2021-05-08 DIAGNOSIS — Z9889 Other specified postprocedural states: Secondary | ICD-10-CM | POA: Diagnosis not present

## 2021-05-08 DIAGNOSIS — Z833 Family history of diabetes mellitus: Secondary | ICD-10-CM

## 2021-05-08 DIAGNOSIS — C3431 Malignant neoplasm of lower lobe, right bronchus or lung: Secondary | ICD-10-CM

## 2021-05-08 DIAGNOSIS — Z66 Do not resuscitate: Secondary | ICD-10-CM | POA: Diagnosis not present

## 2021-05-08 HISTORY — DX: Malignant neoplasm of unspecified part of unspecified bronchus or lung: C34.90

## 2021-05-08 LAB — I-STAT CHEM 8, ED
BUN: 8 mg/dL (ref 8–23)
Calcium, Ion: 1.26 mmol/L (ref 1.15–1.40)
Chloride: 102 mmol/L (ref 98–111)
Creatinine, Ser: 0.5 mg/dL (ref 0.44–1.00)
Glucose, Bld: 106 mg/dL — ABNORMAL HIGH (ref 70–99)
HCT: 32 % — ABNORMAL LOW (ref 36.0–46.0)
Hemoglobin: 10.9 g/dL — ABNORMAL LOW (ref 12.0–15.0)
Potassium: 4 mmol/L (ref 3.5–5.1)
Sodium: 136 mmol/L (ref 135–145)
TCO2: 26 mmol/L (ref 22–32)

## 2021-05-08 LAB — RESP PANEL BY RT-PCR (FLU A&B, COVID) ARPGX2
Influenza A by PCR: NEGATIVE
Influenza B by PCR: NEGATIVE
SARS Coronavirus 2 by RT PCR: NEGATIVE

## 2021-05-08 LAB — CBC
HCT: 32.8 % — ABNORMAL LOW (ref 36.0–46.0)
Hemoglobin: 10.9 g/dL — ABNORMAL LOW (ref 12.0–15.0)
MCH: 30.5 pg (ref 26.0–34.0)
MCHC: 33.2 g/dL (ref 30.0–36.0)
MCV: 91.9 fL (ref 80.0–100.0)
Platelets: 247 10*3/uL (ref 150–400)
RBC: 3.57 MIL/uL — ABNORMAL LOW (ref 3.87–5.11)
RDW: 15.6 % — ABNORMAL HIGH (ref 11.5–15.5)
WBC: 8.2 10*3/uL (ref 4.0–10.5)
nRBC: 0 % (ref 0.0–0.2)

## 2021-05-08 LAB — BASIC METABOLIC PANEL
Anion gap: 7 (ref 5–15)
BUN: 10 mg/dL (ref 8–23)
CO2: 26 mmol/L (ref 22–32)
Calcium: 9.4 mg/dL (ref 8.9–10.3)
Chloride: 103 mmol/L (ref 98–111)
Creatinine, Ser: 0.58 mg/dL (ref 0.44–1.00)
GFR, Estimated: >60 mL/min (ref >60)
Glucose, Bld: 107 mg/dL — ABNORMAL HIGH (ref 70–99)
Potassium: 4 mmol/L (ref 3.5–5.1)
Sodium: 136 mmol/L (ref 135–145)

## 2021-05-08 LAB — TROPONIN I (HIGH SENSITIVITY): Troponin I (High Sensitivity): 4 ng/L (ref <18)

## 2021-05-08 MED ORDER — SODIUM CHLORIDE 0.9 % IV SOLN: 250.0000 mL | INTRAVENOUS | Status: DC | PRN

## 2021-05-08 MED ORDER — ATORVASTATIN CALCIUM 40 MG PO TABS
40.0000 mg | ORAL_TABLET | Freq: Every day | ORAL | Status: DC
  Administered 2021-05-09 – 2021-05-12 (×4): 40 mg via ORAL
  Filled 2021-05-08 (×4): qty 1

## 2021-05-08 MED ORDER — ACETAMINOPHEN 325 MG PO TABS: 650.0000 mg | ORAL_TABLET | Freq: Four times a day (QID) | ORAL | Status: DC | PRN

## 2021-05-08 MED ORDER — ACETAMINOPHEN 650 MG RE SUPP: 650.0000 mg | Freq: Four times a day (QID) | RECTAL | Status: DC | PRN

## 2021-05-08 MED ORDER — SODIUM CHLORIDE 0.9% FLUSH: 3.0000 mL | INTRAVENOUS | Status: DC | PRN

## 2021-05-08 MED ORDER — FAMOTIDINE 20 MG PO TABS: 20.0000 mg | ORAL_TABLET | Freq: Every day | ORAL | Status: DC | PRN

## 2021-05-08 MED ORDER — MULTIVITAMINS PO CAPS: 1.0000 | ORAL_CAPSULE | Freq: Every day | ORAL | Status: DC

## 2021-05-08 MED ORDER — ADULT MULTIVITAMIN W/MINERALS CH
1.0000 | ORAL_TABLET | Freq: Every day | ORAL | Status: DC
  Administered 2021-05-09 – 2021-05-12 (×4): 1 via ORAL
  Filled 2021-05-08 (×4): qty 1

## 2021-05-08 MED ORDER — GLUCERNA SHAKE PO LIQD
237.0000 mL | Freq: Every day | ORAL | Status: DC
  Administered 2021-05-08: 237 mL via ORAL
  Filled 2021-05-08: qty 237

## 2021-05-08 MED ORDER — SODIUM CHLORIDE 0.9% FLUSH
3.0000 mL | Freq: Two times a day (BID) | INTRAVENOUS | Status: DC
  Administered 2021-05-08 – 2021-05-12 (×8): 3 mL via INTRAVENOUS

## 2021-05-08 MED ORDER — LISINOPRIL 5 MG PO TABS
2.5000 mg | ORAL_TABLET | Freq: Every day | ORAL | Status: DC
Start: 2021-05-09 — End: 2021-05-12
  Administered 2021-05-09 – 2021-05-12 (×4): 2.5 mg via ORAL
  Filled 2021-05-08 (×4): qty 1

## 2021-05-08 MED ORDER — VITAMIN B-12 1000 MCG PO TABS
1000.0000 ug | ORAL_TABLET | Freq: Every day | ORAL | Status: DC
  Administered 2021-05-09 – 2021-05-12 (×4): 1000 ug via ORAL
  Filled 2021-05-08 (×4): qty 1

## 2021-05-08 MED ORDER — LEVETIRACETAM 500 MG PO TABS
500.0000 mg | ORAL_TABLET | Freq: Two times a day (BID) | ORAL | Status: DC
  Administered 2021-05-08 – 2021-05-12 (×8): 500 mg via ORAL
  Filled 2021-05-08 (×8): qty 1

## 2021-05-08 MED ORDER — AMITRIPTYLINE HCL 25 MG PO TABS
50.0000 mg | ORAL_TABLET | Freq: Every day | ORAL | Status: DC
  Administered 2021-05-08 – 2021-05-11 (×4): 50 mg via ORAL
  Filled 2021-05-08 (×4): qty 2

## 2021-05-08 MED ORDER — DOCUSATE SODIUM 100 MG PO CAPS
100.0000 mg | ORAL_CAPSULE | Freq: Two times a day (BID) | ORAL | Status: DC
  Administered 2021-05-08 – 2021-05-12 (×8): 100 mg via ORAL
  Filled 2021-05-08 (×9): qty 1

## 2021-05-08 MED ORDER — POLYETHYLENE GLYCOL 3350 17 G PO PACK: 17.0000 g | PACK | Freq: Every day | ORAL | Status: DC | PRN

## 2021-05-08 MED ORDER — HEPARIN BOLUS VIA INFUSION
1500.0000 [IU] | Freq: Once | INTRAVENOUS | Status: AC
  Administered 2021-05-08: 1500 [IU] via INTRAVENOUS
  Filled 2021-05-08: qty 1500

## 2021-05-08 MED ORDER — HEPARIN (PORCINE) 25000 UT/250ML-% IV SOLN
1050.0000 [IU]/h | INTRAVENOUS | Status: DC
  Administered 2021-05-08: 16:00:00 700 [IU]/h via INTRAVENOUS
  Administered 2021-05-09 – 2021-05-10 (×2): 900 [IU]/h via INTRAVENOUS
  Administered 2021-05-12: 1050 [IU]/h via INTRAVENOUS
  Filled 2021-05-08 (×4): qty 250

## 2021-05-08 MED ORDER — ONDANSETRON HCL 4 MG PO TABS: 4.0000 mg | ORAL_TABLET | Freq: Four times a day (QID) | ORAL | Status: DC | PRN

## 2021-05-08 MED ORDER — ALBUTEROL SULFATE (2.5 MG/3ML) 0.083% IN NEBU
2.5000 mg | INHALATION_SOLUTION | RESPIRATORY_TRACT | Status: DC | PRN
  Administered 2021-05-12: 2.5 mg via RESPIRATORY_TRACT
  Filled 2021-05-08: qty 3

## 2021-05-08 MED ORDER — IOHEXOL 300 MG/ML  SOLN
75.0000 mL | Freq: Once | INTRAMUSCULAR | Status: AC | PRN
  Administered 2021-05-08: 75 mL via INTRAVENOUS

## 2021-05-08 MED ORDER — ONDANSETRON HCL 4 MG/2ML IJ SOLN: 4.0000 mg | Freq: Four times a day (QID) | INTRAMUSCULAR | Status: DC | PRN

## 2021-05-08 NOTE — ED Provider Notes (Signed)
[facility] DEPT Provider Note   CSN: [id number] Arrival date & time: 05/08/21  1340     History  Chief Complaint  Patient presents with   Pulmonary Embolism(s)   Weakness    Katelyn Kennedy is a 76 y.o. female.   Weakness Associated symptoms: no fever    Patient presents to the ER for evaluation of an abnormal CT scan as an outpatient that demonstrated a pulmonary embolism.  Patient has history of diabetes, essential hypertension, peripheral vascular disease and lung cancer.  Patient states she did have some pain in her upper back this past weekend.  It was sharp and intense but it resolved.  Patient did not seek any medical evaluation.  Patient has had somewhat of a cough.  She has not felt short of breath.  Patient had a CT scan of the chest performed.  Patient's CT scan showed an acute pulmonary embolism.  The case was discussed with the oncology team who would order the test.  The patient was sent to the ER to be admitted to the hospital.  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  Per the oncology notes the case was discussed with Dr. Mickeal Skinner and IV heparin was recommended with close observation.  Home Medications Prior to Admission medications   Medication Sig Start Date End Date Taking? Authorizing Provider  amitriptyline (ELAVIL) 50 MG tablet Take 1 tablet (50 mg total) by mouth at bedtime. 10/15/20   Bedsole, Amy E, MD  atorvastatin (LIPITOR) 40 MG tablet 1 tablet  po daily Patient taking differently: Take 40 mg by mouth daily. 10/15/20   Bedsole, Amy E, MD  Calcium Citrate-Vitamin D (CITRACAL + D PO) Take 1 tablet by mouth daily.    [provider]  cholecalciferol (VITAMIN D) 25 MCG (1000 UNIT) tablet Take 1,000 Units by mouth daily.    [provider]  famotidine (PEPCID) 20 MG tablet Take 1 tablet (20 mg total) by mouth  daily. 03/19/21   Lavina Hamman, MD  levETIRAcetam (KEPPRA) 500 MG tablet Take 1 tablet (500 mg total) by mouth 2 (two) times daily. 05/01/21   Ventura Sellers, MD  lisinopril (ZESTRIL) 2.5 MG tablet Take 2.5 mg by mouth daily. 04/01/21   [provider]  Multiple Vitamin (MULTIVITAMIN) capsule Take 1 capsule by mouth daily. Centrum Silver    [provider]  polyethylene glycol (MIRALAX / GLYCOLAX) 17 g packet Take 17 g by mouth daily as needed for moderate constipation. 03/18/21   Lavina Hamman, MD  vitamin B-12 (CYANOCOBALAMIN) 1000 MCG tablet Take 1,000 mcg by mouth daily.    [provider]      Allergies    Codeine    Review of Systems   Review of Systems  Constitutional:  Negative for fever.  Neurological:  Positive for weakness.   Physical Exam Updated Vital Signs BP 110/60 (BP Location: Right Arm)    Pulse (!) 108    Temp 97.9 F (36.6 C) (Oral)    Resp (!) 21    Ht 1.473 m (4\' 10" )    Wt 62.1 kg    SpO2 97%    BMI 28.63 kg/m  Physical Exam Vitals and nursing note reviewed.  Constitutional:      Appearance: She is well-developed. She is not diaphoretic.  HENT:     Head: Normocephalic and atraumatic.  Right Ear: External ear normal.     Left Ear: External ear normal.  Eyes:     General: No scleral icterus.       Right eye: No discharge.        Left eye: No discharge.     Conjunctiva/sclera: Conjunctivae normal.  Neck:     Trachea: No tracheal deviation.  Cardiovascular:     Rate and Rhythm: Normal rate and regular rhythm.  Pulmonary:     Effort: Pulmonary effort is normal. No respiratory distress.     Breath sounds: Normal breath sounds. No stridor. No wheezing or rales.  Abdominal:     General: Bowel sounds are normal. There is no distension.     Palpations: Abdomen is soft.     Tenderness: There is no abdominal tenderness. There is no guarding or rebound.  Musculoskeletal:        General: No tenderness or deformity.      Cervical back: Neck supple.  Skin:    General: Skin is warm and dry.     Findings: No rash.  Neurological:     General: No focal deficit present.     Mental Status: She is alert.     Cranial Nerves: No cranial nerve deficit (no facial droop, extraocular movements intact, no slurred speech).     Sensory: No sensory deficit.     Motor: No abnormal muscle tone or seizure activity.     Coordination: Coordination normal.  Psychiatric:        Mood and Affect: Mood normal.    ED Results / Procedures / Treatments   Labs (all labs ordered are listed, but only abnormal results are displayed) Labs Reviewed  CBC  BASIC METABOLIC PANEL  I-STAT CHEM 8, ED  TROPONIN I (HIGH SENSITIVITY)    EKG None  Radiology CT Chest W Contrast  Result Date: 05/08/2021 CLINICAL DATA:  76 year old female with history of small cell lung cancer. Staging examination. EXAM: CT CHEST WITH CONTRAST TECHNIQUE: Multidetector CT imaging of the chest was performed during intravenous contrast administration. RADIATION DOSE REDUCTION: This exam was performed according to the departmental dose-optimization program which includes automated exposure control, adjustment of the mA and/or kV according to patient size and/or use of iterative reconstruction technique. CONTRAST:  56mL OMNIPAQUE IOHEXOL 300 MG/ML  SOLN COMPARISON:  Chest CT 11/11/2020.  PET-CT 12/24/2020. FINDINGS: Cardiovascular: Filling defects are noted throughout the pulmonary arteries bilaterally, including a saddle embolus, as well as filling defects in the main pulmonary arteries bilaterally as well as segmental and subsegmental sized branches. The majority of these appear nonocclusive, although there may be some occlusive embolus in the right lower lobe. Heart size is normal. Right ventricular diameter estimated at 39 mm. Left ventricular diameter estimated at 33 mm. RV to LV ratio of 1.18. There is no significant pericardial fluid, thickening or pericardial  calcification. There is aortic atherosclerosis, as well as atherosclerosis of the great vessels of the mediastinum and the coronary arteries, including calcified atherosclerotic plaque in the left main, left anterior descending, left circumflex and right coronary arteries. Mediastinum/Nodes: No pathologically enlarged mediastinal or hilar lymph nodes. Small hiatal hernia. No axillary lymphadenopathy. Lungs/Pleura: There is again extensive mass-like architectural distortion throughout the right mid to lower lung, compatible with chronic postradiation mass-like fibrosis where there is associated traction bronchiectasis, very similar to prior studies. No acute consolidative airspace disease. No pleural effusions. Patchy areas of mild ground-glass attenuation and septal thickening are now noted throughout the lungs bilaterally. Several small  pulmonary nodules are again noted, largest of which is in the right lower lobe measuring 7 x 6 mm (unchanged). Other smaller pulmonary nodules are also evident, some of which are new compared to the prior study. Examples of this include a 4 mm solid nodule in the left lower lobe (axial image 89 of series 4), and several ill-defined ground-glass attenuation nodules such as a 5 x 8 mm ground-glass attenuation nodule in the left lower lobe (axial image 99 of series 4). 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 Upper Abdomen: Aortic atherosclerosis. Diffuse low attenuation throughout the visualized hepatic parenchyma, indicative of a background of hepatic steatosis. Musculoskeletal: Old healed fractures of the lateral aspect of the seventh and eighth ribs bilaterally are incidentally noted. There are no aggressive appearing lytic or blastic lesions noted in the visualized portions of the skeleton. IMPRESSION: 1. Positive for acute PE with CT evidence of right heart strain  (RV/LV Ratio = 1.18) consistent with at least submassive (intermediate risk) PE. The presence of right heart strain has been associated with an increased risk of morbidity and mortality. Please refer to the "PE Focused" order set in EPIC. 2. Multiple new ill-defined ground-glass attenuation nodules, as well as several small solid-appearing nodules, in addition to widespread ill-defined areas of ground-glass attenuation and septal thickening in the lungs bilaterally. This is nonspecific, and could be seen in the setting of acute infection or drug reaction. Close attention on follow-up studies is recommended to ensure stability or resolution of these findings. 3. 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 4. Aortic atherosclerosis, in addition to left main and three-vessel coronary artery disease. Assessment for potential risk factor modification, dietary therapy or pharmacologic therapy may be warranted, if clinically indicated. 5. Hepatic steatosis. Critical Value/emergent results were called by telephone at the time of interpretation on 05/08/2021 at 12:21 pm to provider [facility] , who verbally acknowledged these results. Aortic Atherosclerosis (ICD10-I70.0). Electronically Signed   By: Vinnie Langton M.D.   On: 05/08/2021 12:33    Procedures Procedures    Medications Ordered in ED Medications - No data to display  ED Course/ Medical Decision Making/ A&P                           Medical Decision Making Amount and/or Complexity of Data Reviewed Independent Historian: caregiver Labs: ordered.  Risk Prescription drug management. Drug therapy requiring intensive monitoring for toxicity. Decision regarding hospitalization.   Pulmonary embolism Patient has evidence of submassive PE on the CT scan.  There is suggestion of heart strain.  Patient has mild tachycardia noted.  She does not have an oxygen requirement.  She is normotensive.   Appears stable at this time.  Will need admission and further work-up including echocardiogram.  I have ordered laboratory tests including troponins. High risk PESI score, 117  History of hemorrhagic brain mets. We will need to monitor very carefully with the initiation of heparin therapy.        Final Clinical Impression(s) / ED Diagnoses Final diagnoses:  Acute pulmonary embolism with acute cor pulmonale, unspecified pulmonary embolism type [facility])     Dorie Rank, MD 05/08/21 2008

## 2021-05-08 NOTE — H&P (Signed)
[facility] History and Physical  Katelyn Kennedy YQM:[id number] DOB: 12-13-1945 DOA: 05/08/2021  Referring physician: ED  PCP: [name], MD   Patient is coming from: Home  Chief Complaint: Generalized weakness  HPI: Katelyn Kennedy is a 76 y.o. female medical history of lung cancer with brain metastasis (adenocarcinoma) history of chemoradiation in the past and craniotomy and resection of brain mass, peripheral vascular disease, diabetes diet-controlled, presented to hospital sent from the oncology clinic for findings of pulmonary embolism on the CT scan.  Patient has been feeling of fatigue and weak in her lower extremities recently and complained of some intrascapular pain 3 to 4 days back.  She also complains of mild cough but denies any shortness of breath fever or chills.  Patient was recently seen by Dr. Mickeal Skinner a week back and is currently on observation for her pain metastasis.  Patient denies any nausea, vomiting, diarrhea or constipation.  Patient denies any sick contacts or recent travel.  Has okay appetite.  Denies any urinary urgency frequency dysuria.  ED Course: In the ED, patient had elevated blood pressure with mild tachycardia at 108.  Hemoglobin was 10.9.  BMP within normal range.  CT scan done today at 10:30 was positive for acute pulmonary [place] with mild right heart strain consistent with possible submassive PE.  Multiple new ill-defined groundglass attenuation nodules in the lungs.  Patient was then admitted to the hospital for further evaluation.  Patient was hemodynamically stable and did not require any oxygen.  Troponin was not elevated.  Patient had some hemorrhagic metastasis and this was discussed with Dr. Mickeal Skinner neuro oncologist who recommended heparin drip for anticoagulation at this time.  Patient was already started on heparin drip in the ED.  Review of Systems:  All systems were reviewed and were negative unless otherwise mentioned in the  HPI  Past Medical History:  Diagnosis Date   Allergic rhinitis    Arthritis    Diabetes mellitus without complication (HCC)    diet control/no meds per pt   Dyspnea    Essential hypertension 04/11/2019   History of chicken pox    History of hiatal hernia    History of radiation therapy    right lung - 06/28/2017-08/11/2017  Dr Kyung Rudd   History of radiation therapy    SRS brain - 11/29/2020-12/04/2020  Dr Jenny Reichmann Moody/Matthew Tammi Klippel   History of radiation therapy    Whole brain - 03/10/2021-03/19/2021 Dr Gery Pray   lung ca dx'd 04/2017   Peripheral vascular disease ([place])    Pneumonia    PONV (postoperative nausea and vomiting)    Smoker    Past Surgical History:  Procedure Laterality Date   ANKLE FRACTURE SURGERY Left 10 years ago   "hard to wake up from anesthesia" per pt   APPLICATION OF CRANIAL NAVIGATION Right 11/07/2020   Procedure: APPLICATION OF CRANIAL NAVIGATION;  Surgeon: Earnie Larsson, MD;  Location: [facility];  Service: Neurosurgery;  Laterality: Right;   [place]   BREAST BIOPSY Right 1978   BENIGN CYST   BREAST EXCISIONAL BIOPSY Left 2011   Benign   BREAST EXCISIONAL BIOPSY Right 1985   Benign    CATARACT EXTRACTION     CATARACT EXTRACTION W/ INTRAOCULAR LENS IMPLANT Bilateral    COLONOSCOPY  2011   CRANIOTOMY Right 11/07/2020   Procedure: Right Sided Craniotomy for Tumor;  Surgeon: Earnie Larsson, MD;  Location: [facility];  Service: Neurosurgery;  Laterality: Right;  ELECTROMAGNETIC NAVIGATION BROCHOSCOPY N/A 06/08/2017   Procedure: ELECTROMAGNETIC NAVIGATION BRONCHOSCOPY;  Surgeon: Flora Lipps, MD;  Location: [facility];  Service: Cardiopulmonary;  Laterality: N/A;   FRACTURE SURGERY     POLYPECTOMY  2011   TRANSURETHRAL RESECTION OF BLADDER TUMOR WITH MITOMYCIN-C N/A 05/04/2017   Procedure: TRANSURETHRAL RESECTION OF BLADDER TUMOR WITH gemcitabine;  Surgeon: Abbie Sons, MD;  Location: [facility];  Service: Urology;  Laterality: N/A;   TUBAL  LIGATION      Social History:  reports that she quit smoking about 4 years ago. Her smoking use included cigarettes. She has a 20.00 pack-year smoking history. She has never used smokeless tobacco. She reports that she does not currently use alcohol. She reports that she does not use drugs.  Allergies  Allergen Reactions   Codeine Nausea Only    Family History  Problem Relation Age of Onset   Diabetes Mother    Hypothyroidism Mother    Cancer Sister 67       BREAST   Breast cancer Sister    Hypothyroidism Sister    Cancer Maternal Grandmother        COLON   Colon cancer Maternal Grandmother 86   Stomach cancer Neg Hx      Prior to Admission medications   Medication Sig Start Date End Date Taking? Authorizing Provider  amitriptyline (ELAVIL) 50 MG tablet Take 1 tablet (50 mg total) by mouth at bedtime. 10/15/20  Yes Bedsole, Amy E, MD  atorvastatin (LIPITOR) 40 MG tablet 1 tablet  po daily Patient taking differently: Take 40 mg by mouth daily. 10/15/20  Yes Bedsole, Amy E, MD  cholecalciferol (VITAMIN D) 25 MCG (1000 UNIT) tablet Take 1,000 Units by mouth daily.   Yes [provider]  levETIRAcetam (KEPPRA) 500 MG tablet Take 1 tablet (500 mg total) by mouth 2 (two) times daily. 05/01/21  Yes Vaslow, Acey Lav, MD  lisinopril (ZESTRIL) 2.5 MG tablet Take 2.5 mg by mouth daily. 04/01/21  Yes [provider]  Multiple Vitamin (MULTIVITAMIN) capsule Take 1 capsule by mouth daily. Centrum Silver   Yes [provider]  polyethylene glycol (MIRALAX / GLYCOLAX) 17 g packet Take 17 g by mouth daily as needed for moderate constipation. 03/18/21  Yes Lavina Hamman, MD  vitamin B-12 (CYANOCOBALAMIN) 1000 MCG tablet Take 1,000 mcg by mouth daily.   Yes [provider]  Calcium Citrate-Vitamin D (CITRACAL + D PO) Take 1 tablet by mouth daily.    [provider]  famotidine (PEPCID) 20 MG tablet Take 1 tablet (20 mg total) by mouth daily. Patient taking  differently: Take 20 mg by mouth daily as needed for heartburn or indigestion. 03/19/21   Lavina Hamman, MD    Physical Exam: Vitals:   05/08/21 1348 05/08/21 1351 05/08/21 1545  BP: 110/60  (!) 145/133  Pulse: (!) 108  (!) 109  Resp: (!) 21  (!) 21  Temp: 97.9 F (36.6 C)    TempSrc: Oral    SpO2: 97%  96%  Weight:  62.1 kg   Height:  4\' 10"  (1.473 m)    Wt Readings from Last 3 Encounters:  05/08/21 62.1 kg  05/08/21 62.3 kg  05/01/21 61.5 kg   Body mass index is 28.63 kg/m.  General:  Average built, not in obvious distress HENT: Normocephalic, pupils equally reacting to light and accommodation.  No scleral pallor or icterus noted. Oral mucosa is moist.  Chest:  Clear breath sounds.  Diminished breath  sounds bilaterally. No crackles or wheezes.  CVS: S1 &S2 heard. No murmur.  Regular rate and rhythm.  Mildly tachycardic. Abdomen: Soft, nontender, nondistended.  Bowel sounds are heard.  Liver is not palpable, no abdominal mass palpated Extremities: No cyanosis, clubbing or edema.  Peripheral pulses are palpable. Psych: Alert, awake and oriented, normal mood CNS:  No cranial nerve deficits.  Power equal in all extremities.   No cerebellar signs.   Skin: Warm and dry.  No rashes noted.  Labs on Admission:   CBC: Recent Labs  Lab 05/08/21 1427 05/08/21 1524  WBC 8.2  --   HGB 10.9* 10.9*  HCT 32.8* 32.0*  MCV 91.9  --   PLT 247  --     Basic Metabolic Panel: Recent Labs  Lab 05/08/21 1427 05/08/21 1524  NA 136 136  K 4.0 4.0  CL 103 102  CO2 26  --   GLUCOSE 107* 106*  BUN 10 8  CREATININE 0.58 0.50  CALCIUM 9.4  --     Liver Function Tests: No results for input(s): AST, ALT, ALKPHOS, BILITOT, PROT, ALBUMIN in the last 168 hours. No results for input(s): LIPASE, AMYLASE in the last 168 hours. No results for input(s): AMMONIA in the last 168 hours.  Cardiac Enzymes: No results for input(s): CKTOTAL, CKMB, CKMBINDEX, TROPONINI in the last 168  hours.  BNP (last 3 results) No results for input(s): BNP in the last 8760 hours.  ProBNP (last 3 results) No results for input(s): PROBNP in the last 8760 hours.  CBG: No results for input(s): GLUCAP in the last 168 hours.  Lipase  No results found for: LIPASE   Urinalysis    Component Value Date/Time   COLORURINE YELLOW 03/31/2021 2052   APPEARANCEUR CLEAR 03/31/2021 2052   APPEARANCEUR Clear 08/15/2020 1508   LABSPEC 1.020 03/31/2021 2052   PHURINE 6.0 03/31/2021 2052   GLUCOSEU 100 (A) 03/31/2021 2052   HGBUR NEGATIVE 03/31/2021 2052   BILIRUBINUR NEGATIVE 03/31/2021 2052   BILIRUBINUR Negative 08/15/2020 [place] 03/31/2021 2052   PROTEINUR NEGATIVE 03/31/2021 2052   UROBILINOGEN 0.2 09/20/2018 1556   NITRITE NEGATIVE 03/31/2021 2052   LEUKOCYTESUR SMALL (A) 03/31/2021 2052     Drugs of Abuse  No results found for: LABOPIA, COCAINSCRNUR, LABBENZ, AMPHETMU, THCU, LABBARB    Radiological Exams on Admission: CT Chest W Contrast  Result Date: 05/08/2021 CLINICAL DATA:  76 year old female with history of small cell lung cancer. Staging examination. EXAM: CT CHEST WITH CONTRAST TECHNIQUE: Multidetector CT imaging of the chest was performed during intravenous contrast administration. RADIATION DOSE REDUCTION: This exam was performed according to the departmental dose-optimization program which includes automated exposure control, adjustment of the mA and/or kV according to patient size and/or use of iterative reconstruction technique. CONTRAST:  49mL OMNIPAQUE IOHEXOL 300 MG/ML  SOLN COMPARISON:  Chest CT 11/11/2020.  PET-CT 12/24/2020. FINDINGS: Cardiovascular: Filling defects are noted throughout the pulmonary arteries bilaterally, including a saddle embolus, as well as filling defects in the main pulmonary arteries bilaterally as well as segmental and subsegmental sized branches. The majority of these appear nonocclusive, although there may be some occlusive  embolus in the right lower lobe. Heart size is normal. Right ventricular diameter estimated at 39 mm. Left ventricular diameter estimated at 33 mm. RV to LV ratio of 1.18. There is no significant pericardial fluid, thickening or pericardial calcification. There is aortic atherosclerosis, as well as atherosclerosis of the great vessels of the mediastinum and the coronary  arteries, including calcified atherosclerotic plaque in the left main, left anterior descending, left circumflex and right coronary arteries. Mediastinum/Nodes: No pathologically enlarged mediastinal or hilar lymph nodes. Small hiatal hernia. No axillary lymphadenopathy. Lungs/Pleura: There is again extensive mass-like architectural distortion throughout the right mid to lower lung, compatible with chronic postradiation mass-like fibrosis where there is associated traction bronchiectasis, very similar to prior studies. No acute consolidative airspace disease. No pleural effusions. Patchy areas of mild ground-glass attenuation and septal thickening are now noted throughout the lungs bilaterally. Several small pulmonary nodules are again noted, largest of which is in the right lower lobe measuring 7 x 6 mm (unchanged). Other smaller pulmonary nodules are also evident, some of which are new compared to the prior study. Examples of this include a 4 mm solid nodule in the left lower lobe (axial image 89 of series 4), and several ill-defined ground-glass attenuation nodules such as a 5 x 8 mm ground-glass attenuation nodule in the left lower lobe (axial image 99 of series 4). 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 Upper Abdomen: Aortic atherosclerosis. Diffuse low attenuation throughout the visualized hepatic parenchyma, indicative of a background of hepatic steatosis. Musculoskeletal: Old healed fractures of the lateral aspect of the  seventh and eighth ribs bilaterally are incidentally noted. There are no aggressive appearing lytic or blastic lesions noted in the visualized portions of the skeleton. IMPRESSION: 1. Positive for acute PE with CT evidence of right heart strain (RV/LV Ratio = 1.18) consistent with at least submassive (intermediate risk) PE. The presence of right heart strain has been associated with an increased risk of morbidity and mortality. Please refer to the "PE Focused" order set in EPIC. 2. Multiple new ill-defined ground-glass attenuation nodules, as well as several small solid-appearing nodules, in addition to widespread ill-defined areas of ground-glass attenuation and septal thickening in the lungs bilaterally. This is nonspecific, and could be seen in the setting of acute infection or drug reaction. Close attention on follow-up studies is recommended to ensure stability or resolution of these findings. 3. 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 4. Aortic atherosclerosis, in addition to left main and three-vessel coronary artery disease. Assessment for potential risk factor modification, dietary therapy or pharmacologic therapy may be warranted, if clinically indicated. 5. Hepatic steatosis. Critical Value/emergent results were called by telephone at the time of interpretation on 05/08/2021 at 12:21 pm to provider [facility] , who verbally acknowledged these results. Aortic Atherosclerosis (ICD10-I70.0). Electronically Signed   By: Vinnie Langton M.D.   On: 05/08/2021 12:33    EKG: Personally reviewed by me which shows sinus tachycardia  Assessment/Plan Principal Problem:   Acute pulmonary embolism (HCC) Active Problems:   Type 2 diabetes mellitus without complication, without long-term current use of insulin ([place])   Hyperlipidemia associated with type 2 diabetes mellitus ([place])   History of lung cancer   Non-small cell lung cancer metastatic to brain  ([place])   Acute pulmonary embolism.  Diagnosed on CT scan today.  Neuro oncology and oncology recommended heparin drip in the hospital and overnight observation.  Patient does not have any oxygen needs.  Troponin was within normal range.  CTA describes some right heart strain.  We will check 2D echocardiogram.  Patient is hemodynamically stable with no chest pain.  We will continue with heparin drip for now.  As needed albuterol.  History of lung cancer metastatic to the brain.  Being followed by oncology and neuro-oncology as outpatient.  Diabetes mellitus type 2 not on medication.  Diet controlled.  We will continue to monitor while in the hospital.  Accu-Cheks and if necessary sliding scale scale insulin   History of hyperlipidemia.  Continue with Lipitor.  Generalized weakness, fatigue.  We will get PT evaluation.   DVT Prophylaxis: Heparin drip.  Consultant: Oncology  Code Status: DNR.  Microbiology none  Antibiotics: None  Family Communication:  Patients' condition and plan of care including tests being ordered have been discussed with the patient and the patient's daughter-in-law who indicate understanding and agree with the plan.   Status is: Inpatient  Remains inpatient appropriate because: Pulmonary embolism, IV heparin drip, heme-onc input, need for closer monitoring due to hemorrhagic metastasis.   Severity of Illness: The appropriate patient status for this patient is INPATIENT. Inpatient status is judged to be reasonable and necessary in order to provide the required intensity of service to ensure the patient's safety. The patient's presenting symptoms, physical exam findings, and initial radiographic and laboratory data in the context of their chronic comorbidities is felt to place them at high risk for further clinical deterioration. Furthermore, it is not anticipated that the patient will be medically stable for discharge from the hospital within 2 midnights of  admission.  I certify that at the point of admission it is my clinical judgment that the patient will require inpatient hospital care spanning beyond 2 midnights from the point of admission due to high intensity of service, high risk for further deterioration and high frequency of surveillance required.  Signed, Flora Lipps, MD [facility] 05/08/2021

## 2021-05-08 NOTE — ED Triage Notes (Signed)
Pt coming from Cancer center today after receiving chest CT results that showed positive result for PE s. Dr sent pt here to be admitted. Pt has hx of lung cancer that has metastasized to brain.

## 2021-05-08 NOTE — Telephone Encounter (Signed)
Report called to ED and pt transported in Wheelchair to ED room 4.

## 2021-05-08 NOTE — Progress Notes (Signed)
ANTICOAGULATION CONSULT NOTE - Initial Consult  Pharmacy Consult for IV heparin Indication: pulmonary embolus  Allergies  Allergen Reactions   Codeine Nausea Only    Patient Measurements: Height: 4\' 10"  (147.3 cm) Weight: 62.1 kg (137 lb) IBW/kg (Calculated) : 40.9 Heparin Dosing Weight: 54 kg  Vital Signs: Temp: 97.9 F (36.6 C) (01/26 1348) Temp Source: Oral (01/26 1348) BP: 110/60 (01/26 1348) Pulse Rate: 108 (01/26 1348)  Labs: No results for input(s): HGB, HCT, PLT, APTT, LABPROT, INR, HEPARINUNFRC, HEPRLOWMOCWT, CREATININE, CKTOTAL, CKMB, TROPONINIHS in the last 72 hours.  Estimated Creatinine Clearance: 45.9 mL/min (by C-G formula based on SCr of 0.73 mg/dL).   Medical History: Past Medical History:  Diagnosis Date   Allergic rhinitis    Arthritis    Diabetes mellitus without complication (HCC)    diet control/no meds per pt   Dyspnea    Essential hypertension 04/11/2019   History of chicken pox    History of hiatal hernia    History of radiation therapy    right lung - 06/28/2017-08/11/2017  Dr Kyung Rudd   History of radiation therapy    SRS brain - 11/29/2020-12/04/2020  Dr Jenny Reichmann Moody/Matthew Tammi Klippel   History of radiation therapy    Whole brain - 03/10/2021-03/19/2021 Dr Gery Pray   lung ca dx'd 04/2017   Peripheral vascular disease ([place])    Pneumonia    PONV (postoperative nausea and vomiting)    Smoker     Medications:  Scheduled:  Infusions:  PRN:   Assessment: 76 year old white female with now metastatic non-small cell lung cancer presented from [facility] after having CT of chest done that showed she has a acute PE, submassive with right heart strain. To start IV heparin per pharmacy dosing. Baseline labs have yet to be drawn, last labs drawn on 04/28/21. Pt not on anticoagulation prior to admission  Goal of Therapy:  Heparin level 0.3-0.7 units/ml Monitor platelets by anticoagulation protocol: Yes   Plan:  After labs drawn, start IV heparin 1500  unit bolus followed by IV heparin rate of 700 units/hr Check heparin level 8 hours after start of IV heparin Daily CBC  Kara Mead 05/08/2021,2:44 PM

## 2021-05-08 NOTE — Progress Notes (Signed)
°   05/08/21 1835  Assess: MEWS Score  Temp 98 F (36.7 C)  BP (!) 90/55  Pulse Rate (!) 106  Resp (!) 24  SpO2 96 %  O2 Device Room Air  Assess: MEWS Score  MEWS Temp 0  MEWS Systolic 1  MEWS Pulse 1  MEWS RR 1  MEWS LOC 0  MEWS Score 3  MEWS Score Color Yellow  Assess: if the MEWS score is Yellow or Red  Were vital signs taken at a resting state? Yes  Focused Assessment No change from prior assessment  Does the patient meet 2 or more of the SIRS criteria? Yes  Does the patient have a confirmed or suspected source of infection? No  MEWS guidelines implemented *See Row Information* No, previously yellow, continue vital signs every 4 hours  Assess: SIRS CRITERIA  SIRS Temperature  0  SIRS Pulse 1  SIRS Respirations  1  SIRS WBC 0  SIRS Score Sum  2   Will continue to monitor.  Layla Maw, RN

## 2021-05-08 NOTE — Progress Notes (Signed)
[facility] [phone]   Fax:([phone]) [phone]  OFFICE PROGRESS NOTE  [name], MD [place] [address]  DIAGNOSIS:  1) Metastatic non-small cell lung cancer, adenocarcinoma initially diagnosed as stage IIIA (T2a, N2, M0) non-small cell lung cancer, adenocarcinoma presenting with right lower lobe lung mass in addition to subcarinal lymphadenopathy and suspicious metastatic pulmonary nodules diagnosed in February 2019.  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  PRIOR THERAPY: 1) Concurrent chemoradiation with carboplatin for an AUC of 2 with paclitaxel 45 mg/m weekly. First dose given on 06/28/2017.  Status post 7 cycles.  Last dose was giving August 09, 2017 with partial response. 2) Consolidation immunotherapy with Imfinzi (Durvalumab) 10 mg/KG every 2 weeks, first dose 09/23/2017.  Status post 26 cycles. Last dose was given on Sep 08, 2018. 3) status post craniotomy with resection of right posterior frontal dural based metastasis under the care of Dr. Trenton Gammon on November 07, 2020.  CURRENT THERAPY: Observation.  INTERVAL HISTORY: ORETA SOLOWAY 76 y.o. female who was seen urgently in the clinic today after record report from radiology indicating that the patient had positive acute pulmonary embolism on the CT scan that was performed today with submassive clot and CT evidence for right heart strain.  The patient is feeling fine except for the fatigue and weakness in the lower extremities.  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  She was seen by Dr. Mickeal Skinner week ago and currently on observation for her brain metastasis.  She denied having any current chest pain but she is tachycardic.  She has no nausea, vomiting, diarrhea or constipation.  She denied having any shortness  of breath or hemoptysis but has dry cough.  She has no weight loss or night sweats.  MEDICAL HISTORY: Past Medical History:  Diagnosis Date   Allergic rhinitis    Arthritis    Diabetes mellitus without complication ([place])    diet control/no meds per pt   Dyspnea    Essential hypertension 04/11/2019   History of chicken pox    History of hiatal hernia    History of radiation therapy    right lung - 06/28/2017-08/11/2017  Dr Kyung Rudd   History of radiation therapy    SRS brain - 11/29/2020-12/04/2020  Dr Jenny Reichmann [place]/Matthew Tammi Klippel   History of radiation therapy    Whole brain - 03/10/2021-03/19/2021 Dr Gery Pray   lung ca dx'd 04/2017   Peripheral vascular disease ([place])    Pneumonia    PONV (postoperative nausea and vomiting)    Smoker     ALLERGIES:  is allergic to codeine.  MEDICATIONS:  Current Outpatient Medications  Medication Sig Dispense Refill   amitriptyline (ELAVIL) 50 MG tablet Take 1 tablet (50 mg total) by mouth at bedtime. 90 tablet 3   atorvastatin (LIPITOR) 40 MG tablet 1 tablet  po daily (Patient taking differently: Take 40 mg by mouth daily.) 90 tablet 3   Calcium Citrate-Vitamin D (CITRACAL + D PO) Take 1 tablet by mouth daily.     cholecalciferol (VITAMIN D) 25 MCG (1000 UNIT) tablet Take 1,000 Units by mouth daily.     famotidine (PEPCID) 20 MG tablet Take 1 tablet (20 mg total) by mouth daily. 30 tablet 0   levETIRAcetam (KEPPRA) 500 MG tablet Take  1 tablet (500 mg total) by mouth 2 (two) times daily. 60 tablet 3   lisinopril (ZESTRIL) 2.5 MG tablet Take 2.5 mg by mouth daily.     Multiple Vitamin (MULTIVITAMIN) capsule Take 1 capsule by mouth daily. Centrum Silver     polyethylene glycol (MIRALAX / GLYCOLAX) 17 g packet Take 17 g by mouth daily as needed for moderate constipation. 14 each 0   vitamin B-12 (CYANOCOBALAMIN) 1000 MCG tablet Take 1,000 mcg by mouth daily.     No current facility-administered medications for this visit.    SURGICAL HISTORY:   Past Surgical History:  Procedure Laterality Date   ANKLE FRACTURE SURGERY Left 10 years ago   "hard to wake up from anesthesia" per pt   APPLICATION OF CRANIAL NAVIGATION Right 11/07/2020   Procedure: APPLICATION OF CRANIAL NAVIGATION;  Surgeon: Earnie Larsson, MD;  Location: [facility];  Service: Neurosurgery;  Laterality: Right;   [place]   BREAST BIOPSY Right 1978   BENIGN CYST   BREAST EXCISIONAL BIOPSY Left 2011   Benign   BREAST EXCISIONAL BIOPSY Right 1985   Benign    CATARACT EXTRACTION     CATARACT EXTRACTION W/ INTRAOCULAR LENS IMPLANT Bilateral    COLONOSCOPY  2011   CRANIOTOMY Right 11/07/2020   Procedure: Right Sided Craniotomy for Tumor;  Surgeon: Earnie Larsson, MD;  Location: [facility];  Service: Neurosurgery;  Laterality: Right;   ELECTROMAGNETIC NAVIGATION BROCHOSCOPY N/A 06/08/2017   Procedure: ELECTROMAGNETIC NAVIGATION BRONCHOSCOPY;  Surgeon: Flora Lipps, MD;  Location: [facility];  Service: Cardiopulmonary;  Laterality: N/A;   FRACTURE SURGERY     POLYPECTOMY  2011   TRANSURETHRAL RESECTION OF BLADDER TUMOR WITH MITOMYCIN-C N/A 05/04/2017   Procedure: TRANSURETHRAL RESECTION OF BLADDER TUMOR WITH gemcitabine;  Surgeon: Abbie Sons, MD;  Location: [facility];  Service: Urology;  Laterality: N/A;   TUBAL LIGATION      REVIEW OF SYSTEMS:  Constitutional: positive for fatigue Eyes: negative Ears, nose, mouth, throat, and face: negative Respiratory: positive for cough Cardiovascular: negative Gastrointestinal: negative Genitourinary:negative Integument/breast: negative Hematologic/lymphatic: negative Musculoskeletal:positive for muscle weakness Neurological: negative Behavioral/Psych: negative Endocrine: negative Allergic/Immunologic: negative   PHYSICAL EXAMINATION: General appearance: alert, cooperative, fatigued, and no distress Head: Normocephalic, without obvious abnormality, atraumatic Neck: no adenopathy, no JVD, supple, symmetrical,  trachea midline, and thyroid not enlarged, symmetric, no tenderness/mass/nodules Lymph nodes: Cervical, supraclavicular, and axillary nodes normal. Resp: clear to auscultation bilaterally Back: symmetric, no curvature. ROM normal. No CVA tenderness. Cardio: Tachycardic GI: soft, non-tender; bowel sounds normal; no masses,  no organomegaly Extremities: extremities normal, atraumatic, no cyanosis or edema Neurologic: Alert and oriented X 3, normal strength and tone. Normal symmetric reflexes. Normal coordination and gait  ECOG PERFORMANCE STATUS: 1 - Symptomatic but completely ambulatory  Blood pressure (!) 116/52, pulse (!) 114, temperature 97.8 F (36.6 C), temperature source Tympanic, resp. rate 18, height 4\' 11"  (1.499 m), weight 137 lb 4.8 oz (62.3 kg), SpO2 97 %.  LABORATORY DATA: Lab Results  Component Value Date   WBC 10.3 04/28/2021   HGB 11.7 (L) 04/28/2021   HCT 36.1 04/28/2021   MCV 92.3 04/28/2021   PLT 330 04/28/2021      Chemistry      Component Value Date/Time   NA 138 04/28/2021 1119   K 4.0 04/28/2021 1119   CL 104 04/28/2021 1119   CO2 27 04/28/2021 1119   BUN 13 04/28/2021 1119   CREATININE 0.73 04/28/2021 1119  Component Value Date/Time   CALCIUM 9.6 04/28/2021 1119   ALKPHOS 158 (H) 04/28/2021 1119   AST 19 04/28/2021 1119   ALT 18 04/28/2021 1119   BILITOT 0.6 04/28/2021 1119       RADIOGRAPHIC STUDIES: CT Chest W Contrast  Result Date: 05/08/2021 CLINICAL DATA:  76 year old female with history of small cell lung cancer. Staging examination. EXAM: CT CHEST WITH CONTRAST TECHNIQUE: Multidetector CT imaging of the chest was performed during intravenous contrast administration. RADIATION DOSE REDUCTION: This exam was performed according to the departmental dose-optimization program which includes automated exposure control, adjustment of the mA and/or kV according to patient size and/or use of iterative reconstruction technique. CONTRAST:  67mL  OMNIPAQUE IOHEXOL 300 MG/ML  SOLN COMPARISON:  Chest CT 11/11/2020.  PET-CT 12/24/2020. FINDINGS: Cardiovascular: Filling defects are noted throughout the pulmonary arteries bilaterally, including a saddle embolus, as well as filling defects in the main pulmonary arteries bilaterally as well as segmental and subsegmental sized branches. The majority of these appear nonocclusive, although there may be some occlusive embolus in the right lower lobe. Heart size is normal. Right ventricular diameter estimated at 39 mm. Left ventricular diameter estimated at 33 mm. RV to LV ratio of 1.18. There is no significant pericardial fluid, thickening or pericardial calcification. There is aortic atherosclerosis, as well as atherosclerosis of the great vessels of the mediastinum and the coronary arteries, including calcified atherosclerotic plaque in the left main, left anterior descending, left circumflex and right coronary arteries. Mediastinum/Nodes: No pathologically enlarged mediastinal or hilar lymph nodes. Small hiatal hernia. No axillary lymphadenopathy. Lungs/Pleura: There is again extensive mass-like architectural distortion throughout the right mid to lower lung, compatible with chronic postradiation mass-like fibrosis where there is associated traction bronchiectasis, very similar to prior studies. No acute consolidative airspace disease. No pleural effusions. Patchy areas of mild ground-glass attenuation and septal thickening are now noted throughout the lungs bilaterally. Several small pulmonary nodules are again noted, largest of which is in the right lower lobe measuring 7 x 6 mm (unchanged). Other smaller pulmonary nodules are also evident, some of which are new compared to the prior study. Examples of this include a 4 mm solid nodule in the left lower lobe (axial image 89 of series 4), and several ill-defined ground-glass attenuation nodules such as a 5 x 8 mm ground-glass attenuation nodule in the left lower  lobe (axial image 99 of series 4). 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 Upper Abdomen: Aortic atherosclerosis. Diffuse low attenuation throughout the visualized hepatic parenchyma, indicative of a background of hepatic steatosis. Musculoskeletal: Old healed fractures of the lateral aspect of the seventh and eighth ribs bilaterally are incidentally noted. There are no aggressive appearing lytic or blastic lesions noted in the visualized portions of the skeleton. IMPRESSION: 1. Positive for acute PE with CT evidence of right heart strain (RV/LV Ratio = 1.18) consistent with at least submassive (intermediate risk) PE. The presence of right heart strain has been associated with an increased risk of morbidity and mortality. Please refer to the "PE Focused" order set in EPIC. 2. Multiple new ill-defined ground-glass attenuation nodules, as well as several small solid-appearing nodules, in addition to widespread ill-defined areas of ground-glass attenuation and septal thickening in the lungs bilaterally. This is nonspecific, and could be seen in the setting of acute infection or drug reaction. Close attention on follow-up studies is recommended to ensure  stability or resolution of these findings. 3. 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 4. Aortic atherosclerosis, in addition to left main and three-vessel coronary artery disease. Assessment for potential risk factor modification, dietary therapy or pharmacologic therapy may be warranted, if clinically indicated. 5. Hepatic steatosis. Critical Value/emergent results were called by telephone at the time of interpretation on 05/08/2021 at 12:21 pm to provider [facility] , who verbally acknowledged these results. Aortic Atherosclerosis (ICD10-I70.0). Electronically Signed   By: Vinnie Langton M.D.    On: 05/08/2021 12:33     ASSESSMENT AND PLAN: This is a very pleasant 76 year old white female with now metastatic non-small cell lung cancer, adenocarcinoma initially diagnosed as stage IIIA non-small cell lung cancer, adenocarcinoma presented mainly with right lower lobe lung mass in addition to subcarinal lymphadenopathy but there was suspicious metastatic pulmonary nodules.  She has metastatic disease to the brain in July 2022. The patient completed a course of concurrent chemoradiation with weekly carboplatin and paclitaxel.  She tolerated her treatment well with no concerning complaints. The patient recently completed consolidation treatment with immunotherapy with Imfinzi (Durvalumab). She is status post 26 cycles. She tolerated it well without any adverse effects.  The patient underwent craniotomy and surgical resection of the solitary brain metastasis under the care of Dr. Trenton Gammon and the final pathology was consistent with metastatic adenocarcinoma of the lung. She had SRS to the resection cavity of the brain tumor. The patient also had multiple brain metastasis in November 2022 treated with whole brain radiation.  Some of the brain metastasis were hemorrhagic in nature.  Last CT scan of the head without contrast on March 31, 2021 showed improvement in the hemorrhagic lesions. The patient had CT scan of the chest performed recently.  I received a call report from radiology indicating the presence of acute PE with CT evidence of right heart strain consistent with at least submassive PE. I discussed her scan result with Dr. Mickeal Skinner with the neuro oncologist to find the best way to treat this patient for her pulmonary embolism with the history of hemorrhagic brain metastasis in the past. We are in agreement to send the patient to the hospital for admission and consideration of IV heparin infusion with close monitoring of her condition.  We may have to switch her in the future to subcutaneous  Lovenox and also may consider The patient for IVC filter at some point. I will be out of town starting this afternoon but I discussed her case with Dr. Marin Olp who is covering today in case there is any additional questions.   All questions were answered. The patient knows to call the clinic with any problems, questions or concerns. We can certainly see the patient much sooner if necessary.  Disclaimer: This note was dictated with voice recognition software. Similar sounding words can inadvertently be transcribed and may not be corrected upon review.

## 2021-05-09 ENCOUNTER — Inpatient Hospital Stay (HOSPITAL_COMMUNITY): Payer: PPO

## 2021-05-09 ENCOUNTER — Encounter (HOSPITAL_COMMUNITY): Payer: Self-pay | Admitting: Internal Medicine

## 2021-05-09 DIAGNOSIS — I2609 Other pulmonary embolism with acute cor pulmonale: Secondary | ICD-10-CM

## 2021-05-09 DIAGNOSIS — I2699 Other pulmonary embolism without acute cor pulmonale: Principal | ICD-10-CM

## 2021-05-09 LAB — ECHOCARDIOGRAM COMPLETE
AR max vel: 2.82 cm2
AV Area VTI: 3.02 cm2
AV Area mean vel: 2.79 cm2
AV Mean grad: 3 mmHg
AV Peak grad: 5.5 mmHg
Ao pk vel: 1.17 m/s
Area-P 1/2: 5.23 cm2
Height: 58 in
S' Lateral: 2.4 cm
Weight: 2144 oz

## 2021-05-09 LAB — COMPREHENSIVE METABOLIC PANEL
ALT: 21 U/L (ref 0–44)
AST: 27 U/L (ref 15–41)
Albumin: 2.9 g/dL — ABNORMAL LOW (ref 3.5–5.0)
Alkaline Phosphatase: 110 U/L (ref 38–126)
Anion gap: 8 (ref 5–15)
BUN: 13 mg/dL (ref 8–23)
CO2: 24 mmol/L (ref 22–32)
Calcium: 8.8 mg/dL — ABNORMAL LOW (ref 8.9–10.3)
Chloride: 104 mmol/L (ref 98–111)
Creatinine, Ser: 0.56 mg/dL (ref 0.44–1.00)
GFR, Estimated: >60 mL/min (ref >60)
Glucose, Bld: 172 mg/dL — ABNORMAL HIGH (ref 70–99)
Potassium: 4 mmol/L (ref 3.5–5.1)
Sodium: 136 mmol/L (ref 135–145)
Total Bilirubin: 0.5 mg/dL (ref 0.3–1.2)
Total Protein: 6.3 g/dL — ABNORMAL LOW (ref 6.5–8.1)

## 2021-05-09 LAB — GLUCOSE, CAPILLARY
Glucose-Capillary: 159 mg/dL — ABNORMAL HIGH (ref 70–99)
Glucose-Capillary: 130 mg/dL — ABNORMAL HIGH (ref 70–99)

## 2021-05-09 LAB — CBC
HCT: 32 % — ABNORMAL LOW (ref 36.0–46.0)
Hemoglobin: 10.3 g/dL — ABNORMAL LOW (ref 12.0–15.0)
MCH: 30.4 pg (ref 26.0–34.0)
MCHC: 32.2 g/dL (ref 30.0–36.0)
MCV: 94.4 fL (ref 80.0–100.0)
Platelets: 249 10*3/uL (ref 150–400)
RBC: 3.39 MIL/uL — ABNORMAL LOW (ref 3.87–5.11)
RDW: 15.9 % — ABNORMAL HIGH (ref 11.5–15.5)
WBC: 8.2 10*3/uL (ref 4.0–10.5)
nRBC: 0 % (ref 0.0–0.2)

## 2021-05-09 LAB — HEPARIN LEVEL (UNFRACTIONATED)
Heparin Unfractionated: 0.48 IU/mL (ref 0.30–0.70)
Heparin Unfractionated: 0.49 IU/mL (ref 0.30–0.70)
Heparin Unfractionated: <0.1 IU/mL — ABNORMAL LOW (ref 0.30–0.70)

## 2021-05-09 LAB — MAGNESIUM: Magnesium: 2 mg/dL (ref 1.7–2.4)

## 2021-05-09 LAB — PHOSPHORUS: Phosphorus: 3.7 mg/dL (ref 2.5–4.6)

## 2021-05-09 MED ORDER — GLUCERNA SHAKE PO LIQD
237.0000 mL | Freq: Two times a day (BID) | ORAL | Status: DC
  Administered 2021-05-09 – 2021-05-11 (×5): 237 mL via ORAL
  Filled 2021-05-09 (×7): qty 237

## 2021-05-09 MED ORDER — HEPARIN BOLUS VIA INFUSION
1500.0000 [IU] | Freq: Once | INTRAVENOUS | Status: AC
  Administered 2021-05-09: 1500 [IU] via INTRAVENOUS
  Filled 2021-05-09: qty 1500

## 2021-05-09 NOTE — Progress Notes (Signed)
PT Cancellation Note  Patient Details Name: [name] MRN: [id number] DOB: 11-Jun-1945   Cancelled Treatment:    Reason Eval/Treat Not Completed: Patient not medically ready. Per chart review, pt admitted with acute PE. IV Heparin initiated at 3:51pm on 1/26, per protocol will hold PT eval until 24 hrs after heparin started and in therapeutic range.    Talbot Grumbling PT, DPT 05/09/21, 12:06 PM

## 2021-05-09 NOTE — Progress Notes (Signed)
PROGRESS NOTE  [name] RSW:[id number] DOB: 31-Dec-1945 DOA: 05/08/2021 PCP: [name], MD   LOS: 1 day   Brief narrative: Katelyn Kennedy is a 76 y.o. female medical history of lung cancer with brain metastasis (adenocarcinoma) history of chemoradiation in the past, craniotomy and resection of brain mass, peripheral vascular disease, diabetes diet-controlled, presented to the hospital sent from the oncology clinic for findings of pulmonary embolism on the CT scan.  Patient has been feeling of fatigue and weak in her lower extremities recently and complained of some intrascapular pain 3 to 4 days back.  In the ED, patient had elevated blood pressure with mild tachycardia at 108.  Hemoglobin was 10.9.  BMP within normal range.  CT scan done today at 10:30 was positive for acute pulmonary [place] with mild right heart strain consistent with possible submassive PE.  Multiple new ill-defined groundglass attenuation nodules in the lungs. .  Patient was hemodynamically stable and did not require any oxygen.  Troponin was not elevated.  Patient had some hemorrhagic metastasis and this was discussed with Dr. Mickeal Skinner neuro oncologist who recommended heparin drip for anticoagulation. Patient was then admitted to the hospital for further evaluation  Assessment/Plan:  Principal Problem:   Acute pulmonary embolism ([place]) Active Problems:   Type 2 diabetes mellitus without complication, without long-term current use of insulin ([place])   Hyperlipidemia associated with type 2 diabetes mellitus ([place])   History of lung cancer   Non-small cell lung cancer metastatic to brain ([place])   Acute pulmonary embolism.   Diagnosed on CT chest scan in the office.  Neuro oncology and oncology recommended heparin drip. Troponin was within normal range.  CTA described some right heart strain patient remained hemodynamically stable with normal troponins..  2D echocardiogram showed LV ejection fraction of 60 to 65% with  grade 1 diastolic dysfunction.  Communicated with oncology who recommended continuation of heparin drip.   History of lung cancer metastatic to the brain.  Being followed by oncology and neuro-oncology as outpatient.  Oncology following while in hospital.   Diabetes mellitus type 2 not on medication.  Diet controlled.  We will continue to monitor while in the hospital.  Accu-Cheks and if necessary sliding scale scale insulin    History of hyperlipidemia.  Continue with Lipitor.   Generalized weakness, fatigue.  PT evaluation pending.  DVT prophylaxis:   Heparin drip.   Code Status: DNR  Family Communication:  Spoke with the patient's daughter-in-law yesterday  Status is: Inpatient  Remains inpatient appropriate because: IV heparin drip, need for closer monitoring.    Consultants: Oncology  Procedures: None yet  Anti-infectives:  None  Anti-infectives (From admission, onward)    None        Subjective: Today, patient was seen and examined at bedside.  Patient denies any chest pain, dizziness, lightheadedness, nausea or vomiting.  Has mild dry cough denies any dizziness.  Objective: Vitals:   05/09/21 0228 05/09/21 0607  BP: (!) 124/53 125/62  Pulse: (!) 109 (!) 110  Resp: 16 20  Temp: 99.7 F (37.6 C) 98.7 F (37.1 C)  SpO2: 94% 93%    Intake/Output Summary (Last 24 hours) at 05/09/2021 1240 Last data filed at 05/09/2021 1152 Gross per 24 hour  Intake 87.45 ml  Output 900 ml  Net -812.55 ml   Filed Weights   05/08/21 1351 05/08/21 1835  Weight: 62.1 kg 60.8 kg   Body mass index is 28.01 kg/m.   Physical Exam:  GENERAL:  Patient is alert awake and oriented. Not in obvious distress. HENT: No scleral pallor or icterus. Pupils equally reactive to light. Oral mucosa is moist NECK: is supple, no gross swelling noted. CHEST: Clear to auscultation. No crackles or wheezes.  Diminished breath sounds bilaterally. CVS: S1 and S2 heard, no murmur. Regular  rate and rhythm.  ABDOMEN: Soft, non-tender, bowel sounds are present. EXTREMITIES: No edema. CNS: Cranial nerves are intact. No focal motor deficits. SKIN: warm and dry without rashes.  Data Review: I have personally reviewed the following laboratory data and studies,  CBC: Recent Labs  Lab 05/08/21 1427 05/08/21 1524 05/09/21 0005  WBC 8.2  --  8.2  HGB 10.9* 10.9* 10.3*  HCT 32.8* 32.0* 32.0*  MCV 91.9  --  94.4  PLT 247  --  161   Basic Metabolic Panel: Recent Labs  Lab 05/08/21 1427 05/08/21 1524 05/09/21 0005  NA 136 136 136  K 4.0 4.0 4.0  CL 103 102 104  CO2 26  --  24  GLUCOSE 107* 106* 172*  BUN 10 8 13   CREATININE 0.58 0.50 0.56  CALCIUM 9.4  --  8.8*  MG  --   --  2.0  PHOS  --   --  3.7   Liver Function Tests: Recent Labs  Lab 05/09/21 0005  AST 27  ALT 21  ALKPHOS 110  BILITOT 0.5  PROT 6.3*  ALBUMIN 2.9*   No results for input(s): LIPASE, AMYLASE in the last 168 hours. No results for input(s): AMMONIA in the last 168 hours. Cardiac Enzymes: No results for input(s): CKTOTAL, CKMB, CKMBINDEX, TROPONINI in the last 168 hours. BNP (last 3 results) No results for input(s): BNP in the last 8760 hours.  ProBNP (last 3 results) No results for input(s): PROBNP in the last 8760 hours.  CBG: Recent Labs  Lab 05/09/21 0745  GLUCAP 159*   Recent Results (from the past 240 hour(s))  Resp Panel by RT-PCR (Flu A&B, Covid) Nasopharyngeal Swab     Status: None   Collection Time: 05/08/21  4:51 PM   Specimen: Nasopharyngeal Swab; Nasopharyngeal(NP) swabs in vial transport medium  Result Value Ref Range Status   SARS Coronavirus 2 by RT PCR NEGATIVE NEGATIVE Final    Comment: (NOTE) SARS-CoV-2 target nucleic acids are NOT DETECTED.  The SARS-CoV-2 RNA is generally detectable in upper respiratory specimens during the acute phase of infection. The lowest concentration of SARS-CoV-2 viral copies this assay can detect is 138 copies/mL. A negative  result does not preclude SARS-Cov-2 infection and should not be used as the sole basis for treatment or other patient management decisions. A negative result may occur with  improper specimen collection/handling, submission of specimen other than nasopharyngeal swab, presence of viral mutation(s) within the areas targeted by this assay, and inadequate number of viral copies(<138 copies/mL). A negative result must be combined with clinical observations, patient history, and epidemiological information. The expected result is Negative.  Fact Sheet for Patients:  EntrepreneurPulse.com.au  Fact Sheet for Healthcare Providers:  IncredibleEmployment.be  This test is no t yet approved or cleared by the [place] FDA and  has been authorized for detection and/or diagnosis of SARS-CoV-2 by FDA under an Emergency Use Authorization (EUA). This EUA will remain  in effect (meaning this test can be used) for the duration of the COVID-19 declaration under Section 564(b)(1) of the Act, 21 U.S.C.section 360bbb-3(b)(1), unless the authorization is terminated  or revoked sooner.       Influenza  A by PCR NEGATIVE NEGATIVE Final   Influenza B by PCR NEGATIVE NEGATIVE Final    Comment: (NOTE) The Xpert Xpress SARS-CoV-2/FLU/RSV plus assay is intended as an aid in the diagnosis of influenza from Nasopharyngeal swab specimens and should not be used as a sole basis for treatment. Nasal washings and aspirates are unacceptable for Xpert Xpress SARS-CoV-2/FLU/RSV testing.  Fact Sheet for Patients: EntrepreneurPulse.com.au  Fact Sheet for Healthcare Providers: IncredibleEmployment.be  This test is not yet approved or cleared by the [place] FDA and has been authorized for detection and/or diagnosis of SARS-CoV-2 by FDA under an Emergency Use Authorization (EUA). This EUA will remain in effect (meaning this test can be used)  for the duration of the COVID-19 declaration under Section 564(b)(1) of the Act, 21 U.S.C. section 360bbb-3(b)(1), unless the authorization is terminated or revoked.  Performed at [facility], [place] [address], [place],  [address]      Studies: CT Chest W Contrast  Result Date: 05/08/2021 CLINICAL DATA:  76 year old female with history of small cell lung cancer. Staging examination. EXAM: CT CHEST WITH CONTRAST TECHNIQUE: Multidetector CT imaging of the chest was performed during intravenous contrast administration. RADIATION DOSE REDUCTION: This exam was performed according to the departmental dose-optimization program which includes automated exposure control, adjustment of the mA and/or kV according to patient size and/or use of iterative reconstruction technique. CONTRAST:  59mL OMNIPAQUE IOHEXOL 300 MG/ML  SOLN COMPARISON:  Chest CT 11/11/2020.  PET-CT 12/24/2020. FINDINGS: Cardiovascular: Filling defects are noted throughout the pulmonary arteries bilaterally, including a saddle embolus, as well as filling defects in the main pulmonary arteries bilaterally as well as segmental and subsegmental sized branches. The majority of these appear nonocclusive, although there may be some occlusive embolus in the right lower lobe. Heart size is normal. Right ventricular diameter estimated at 39 mm. Left ventricular diameter estimated at 33 mm. RV to LV ratio of 1.18. There is no significant pericardial fluid, thickening or pericardial calcification. There is aortic atherosclerosis, as well as atherosclerosis of the great vessels of the mediastinum and the coronary arteries, including calcified atherosclerotic plaque in the left main, left anterior descending, left circumflex and right coronary arteries. Mediastinum/Nodes: No pathologically enlarged mediastinal or hilar lymph nodes. Small hiatal hernia. No axillary lymphadenopathy. Lungs/Pleura: There is again extensive mass-like  architectural distortion throughout the right mid to lower lung, compatible with chronic postradiation mass-like fibrosis where there is associated traction bronchiectasis, very similar to prior studies. No acute consolidative airspace disease. No pleural effusions. Patchy areas of mild ground-glass attenuation and septal thickening are now noted throughout the lungs bilaterally. Several small pulmonary nodules are again noted, largest of which is in the right lower lobe measuring 7 x 6 mm (unchanged). Other smaller pulmonary nodules are also evident, some of which are new compared to the prior study. Examples of this include a 4 mm solid nodule in the left lower lobe (axial image 89 of series 4), and several ill-defined ground-glass attenuation nodules such as a 5 x 8 mm ground-glass attenuation nodule in the left lower lobe (axial image 99 of series 4). 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 Upper Abdomen: Aortic atherosclerosis. Diffuse low attenuation throughout the visualized hepatic parenchyma, indicative of a background of hepatic steatosis. Musculoskeletal: Old healed fractures of the lateral aspect of the seventh and eighth ribs bilaterally are incidentally noted. There are no  aggressive appearing lytic or blastic lesions noted in the visualized portions of the skeleton. IMPRESSION: 1. Positive for acute PE with CT evidence of right heart strain (RV/LV Ratio = 1.18) consistent with at least submassive (intermediate risk) PE. The presence of right heart strain has been associated with an increased risk of morbidity and mortality. Please refer to the "PE Focused" order set in EPIC. 2. Multiple new ill-defined ground-glass attenuation nodules, as well as several small solid-appearing nodules, in addition to widespread ill-defined areas of ground-glass attenuation and septal thickening in the  lungs bilaterally. This is nonspecific, and could be seen in the setting of acute infection or drug reaction. Close attention on follow-up studies is recommended to ensure stability or resolution of these findings. 3. 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 4. Aortic atherosclerosis, in addition to left main and three-vessel coronary artery disease. Assessment for potential risk factor modification, dietary therapy or pharmacologic therapy may be warranted, if clinically indicated. 5. Hepatic steatosis. Critical Value/emergent results were called by telephone at the time of interpretation on 05/08/2021 at 12:21 pm to provider [facility] , who verbally acknowledged these results. Aortic Atherosclerosis (ICD10-I70.0). Electronically Signed   By: Vinnie Langton M.D.   On: 05/08/2021 12:33      Flora Lipps, MD  [facility] 05/09/2021  If 7PM-7AM, please contact night-coverage

## 2021-05-09 NOTE — Progress Notes (Addendum)
ANTICOAGULATION CONSULT NOTE - Follow Up  Pharmacy Consult for IV heparin Indication: pulmonary embolus  Allergies  Allergen Reactions   Codeine Nausea Only    Patient Measurements: Height: 4\' 10"  (147.3 cm) Weight: 60.8 kg (134 lb) IBW/kg (Calculated) : 40.9 Heparin Dosing Weight: 54 kg  Vital Signs: Temp: 98.7 F (37.1 C) (01/27 0607) Temp Source: Oral (01/27 0607) BP: 125/62 (01/27 0607) Pulse Rate: 110 (01/27 0607)  Labs: Recent Labs    05/08/21 1427 05/08/21 1524 05/09/21 0005  HGB 10.9* 10.9* 10.3*  HCT 32.8* 32.0* 32.0*  PLT 247  --  249  HEPARINUNFRC  --   --  <0.10*  CREATININE 0.58 0.50 0.56  TROPONINIHS 4  --   --     Estimated Creatinine Clearance: 45.5 mL/min (by C-G formula based on SCr of 0.56 mg/dL).   Medical History: Past Medical History:  Diagnosis Date   Allergic rhinitis    Arthritis    Diabetes mellitus without complication ([place])    diet control/no meds per pt   Dyspnea    Essential hypertension 04/11/2019   History of chicken pox    History of hiatal hernia    History of radiation therapy    right lung - 06/28/2017-08/11/2017  Dr Kyung Rudd   History of radiation therapy    SRS brain - 11/29/2020-12/04/2020  Dr Jenny Reichmann Moody/Matthew Tammi Klippel   History of radiation therapy    Whole brain - 03/10/2021-03/19/2021 Dr Gery Pray   lung ca dx'd 04/2017   Lung cancer ([place])    Peripheral vascular disease ([place])    Pneumonia    PONV (postoperative nausea and vomiting)    Smoker     Medications: Pt not on anticoagulants PTA  Assessment: Pt is a 89 yoF with 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 Presented from cancer center after chest CT revealed she has a acute PE, submassive with right heart strain. Pharmacy consulted to dose heparin. Per H&P - MD discussed anticoagulation with Dr Mickeal Skinner as pt has some hemorrhagic metastasis - recommended heparin drip on admission.  Today, 05/09/21 HL = 0.48 is therapeutic on heparin infusion of 900  units/hr CBC: Hgb slightly low but stable; Plt WNL Confirmed with RN that heparin infusing at correct rate. No signs of bleeding. No interruptions.   Goal of Therapy:  Heparin level 0.3-0.7 units/ml Monitor platelets by anticoagulation protocol: Yes   Plan:  Continue heparin infusion at current rate of 900 units/hr Check confirmatory 8 hour HL HL, CBC daily while on heparin drip Monitor for signs of bleeding Follow along for eventual transition to PO or subcutaneous anticoagulation  Lenis Noon, PharmD 05/09/2021,8:52 AM  Addendum: Discussed anticoagulation with Dr. Mickeal Skinner and confirmed ok to bolus heparin if needed.  Lenis Noon, PharmD 05/09/21 2:22 PM

## 2021-05-09 NOTE — Progress Notes (Signed)
ANTICOAGULATION CONSULT NOTE - Follow Up  Pharmacy Consult for IV heparin Indication: pulmonary embolus  Allergies  Allergen Reactions   Codeine Nausea Only    Patient Measurements: Height: 4\' 10"  (147.3 cm) Weight: 60.8 kg (134 lb) IBW/kg (Calculated) : 40.9 Heparin Dosing Weight: 54 kg  Vital Signs: Temp: 98.6 F (37 C) (01/27 1556) Temp Source: Oral (01/27 1556) BP: 106/62 (01/27 1556) Pulse Rate: 109 (01/27 1556)  Labs: Recent Labs    05/08/21 1427 05/08/21 1524 05/09/21 0005 05/09/21 0841 05/09/21 1630  HGB 10.9* 10.9* 10.3*  --   --   HCT 32.8* 32.0* 32.0*  --   --   PLT 247  --  249  --   --   HEPARINUNFRC  --   --  <0.10* 0.48 0.49  CREATININE 0.58 0.50 0.56  --   --   TROPONINIHS 4  --   --   --   --      Estimated Creatinine Clearance: 45.5 mL/min (by C-G formula based on SCr of 0.56 mg/dL).   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 Presented from cancer center after chest CT revealed she has a acute PE, submassive with right heart strain. Pharmacy consulted to dose heparin. Per H&P - MD discussed anticoagulation with Dr Mickeal Skinner as pt has some hemorrhagic metastasis - recommended heparin drip  on admission.  Dr. Mickeal Skinner has confirmed it is ok to bolus heparin if needed.  Today, 05/09/21 PM HL = 0.49 is therapeutic on heparin infusion of 900 units/hr This is the second therapeutic level CBC: Hgb slightly low but stable; Plt WNL Per RN no signs of bleeding. No interruptions.   Goal of Therapy:  Heparin level 0.3-0.7 units/ml Monitor platelets by anticoagulation protocol: Yes   Plan:  Continue heparin infusion at current rate of 900 units/hr HL, CBC daily while on heparin drip Monitor for signs and symptoms of bleeding Follow along for eventual transition to PO or subcutaneous anticoagulation  Loreta Ave, Student Pharmacist 05/09/2021,5:34 PM

## 2021-05-09 NOTE — TOC Progression Note (Addendum)
Transition of Care [facility]) - Progression Note    Patient Details  Name: VIANKA ERTEL MRN: [id number] Date of Birth: Nov 06, 1945  Transition of Care [facility]) CM/SW Contact  Purcell Mouton, RN Phone Number: 05/09/2021, 1:42 PM  Clinical Narrative:    Spoke with pt concerning home Health. Pt was active with [facility] for HHRN/PT/SW/ST and would like to continue. Corene Cornea with [facility] was called.    Expected Discharge Plan: [place] Barriers to Discharge: No Barriers Identified  Expected Discharge Plan and Services Expected Discharge Plan: [place]   Discharge Planning Services: CM Consult Post Acute Care Choice: [place] arrangements for the past 2 months: [place]: PT [place] Agency: [place] ([facility])         Social Determinants of Health (SDOH) Interventions    Readmission Risk Interventions Readmission Risk Prevention Plan 03/19/2021 11/08/2020  Post Dischage Appt - Complete  Medication Screening - Complete  Transportation Screening Complete Complete  PCP or Specialist Appt within 3-5 Days Complete -  HRI or Home Care Consult Complete -  Social Work Consult for [place] Planning/Counseling Complete -  Palliative Care Screening Not Applicable -  Medication Review Press photographer) Complete -  Some recent data might be hidden

## 2021-05-09 NOTE — Consult Note (Addendum)
[place]  Telephone:([phone]) [phone] Fax:([phone]) [phone]   MEDICAL ONCOLOGY - INITIAL CONSULTATION  Referral MD: Dr. Flora Lipps  Reason for Referral: Metastatic lung cancer, new diagnosis acute PE  HPI: Katelyn Kennedy is a 76 year old female with a past medical history significant for metastatic lung adenocarcinoma with brain metastasis, peripheral vascular disease, diabetes.  She presented to the emergency department from the cancer center for new findings of PE on CT scan.  She had been experiencing fatigue and weakness in her lower extremities.  In terms of her lung cancer history, she initially received concurrent chemoradiation with carboplatin for an AUC of 2 and paclitaxel 45 mg per metered squared weekly between 06/28/2017 through 08/09/2017 with a partial response.  She then received consolidation immunotherapy with Imfinzi 10 mg/kg every 2 weeks from 09/23/2017 through 09/08/2018.  She was found to have a right frontal mass on MRI performed 10/30/2020.  She underwent craniotomy with resection of the brain mass by Dr. Trenton Gammon on 11/07/2020 and received postop SRS on 12/04/2020.  Follow-up MRI of the brain showed multiple hemorrhagic lesions and significant vasogenic edema and she underwent whole brain radiation which was completed on 03/19/2021.  Thereafter, she has been observation.  She had a CT of the head without contrast performed on 03/31/2021 which showed her known multifocal intracranial metastatic disease with both parenchymal and dural based lesions and hemorrhage associated with the intracranial metastases was stable.  Restaging CT scan of the chest with contrast was performed on 05/08/2021 which showed the acute PE with right heart strain consistent with at least submassive PE.  She was also found to have multiple new ill-defined groundglass attenuation nodules as well as several small solid-appearing nodules in addition to widespread ill-defined areas of groundglass attenuation  and septal thickening in the lungs bilaterally which is nonspecific but could be due to acute infection or drug reaction.  The finding of the new PE was discussed with neuro-oncology due to her hemorrhagic brain lesions, and the recommendation was to begin heparin and monitor closely.  If she does well with heparin, would consider switching to subcu Lovenox and possible IVC filter placement.  The patient is sitting up in recliner this morning.  She currently denies headaches and dizziness.  She has not had any change in her neuro functional status per nursing.  She denies chest pain and shortness of breath but reports a congested cough.  Denies abdominal pain, nausea, vomiting.  No bleeding reported.  She is on a heparin drip.  Medical oncology was asked see the patient make recommendations regarding her metastatic lung cancer and new diagnosis of acute PE and anticoagulation management.  Past Medical History:  Diagnosis Date   Allergic rhinitis    Arthritis    Diabetes mellitus without complication ([place])    diet control/no meds per pt   Dyspnea    Essential hypertension 04/11/2019   History of chicken pox    History of hiatal hernia    History of radiation therapy    right lung - 06/28/2017-08/11/2017  Dr Kyung Rudd   History of radiation therapy    SRS brain - 11/29/2020-12/04/2020  Dr Jenny Reichmann [place]/Matthew Tammi Klippel   History of radiation therapy    Whole brain - 03/10/2021-03/19/2021 Dr Gery Pray   lung ca dx'd 04/2017   Peripheral vascular disease ([place])    Pneumonia    PONV (postoperative nausea and vomiting)    Smoker   :   Past Surgical History:  Procedure Laterality Date  ANKLE FRACTURE SURGERY Left 10 years ago   "hard to wake up from anesthesia" per pt   APPLICATION OF CRANIAL NAVIGATION Right 11/07/2020   Procedure: APPLICATION OF CRANIAL NAVIGATION;  Surgeon: Earnie Larsson, MD;  Location: [facility];  Service: Neurosurgery;  Laterality: Right;   [place]   BREAST  BIOPSY Right 1978   BENIGN CYST   BREAST EXCISIONAL BIOPSY Left 2011   Benign   BREAST EXCISIONAL BIOPSY Right 1985   Benign    CATARACT EXTRACTION     CATARACT EXTRACTION W/ INTRAOCULAR LENS IMPLANT Bilateral    COLONOSCOPY  2011   CRANIOTOMY Right 11/07/2020   Procedure: Right Sided Craniotomy for Tumor;  Surgeon: Earnie Larsson, MD;  Location: [facility];  Service: Neurosurgery;  Laterality: Right;   ELECTROMAGNETIC NAVIGATION BROCHOSCOPY N/A 06/08/2017   Procedure: ELECTROMAGNETIC NAVIGATION BRONCHOSCOPY;  Surgeon: Flora Lipps, MD;  Location: [facility];  Service: Cardiopulmonary;  Laterality: N/A;   FRACTURE SURGERY     POLYPECTOMY  2011   TRANSURETHRAL RESECTION OF BLADDER TUMOR WITH MITOMYCIN-C N/A 05/04/2017   Procedure: TRANSURETHRAL RESECTION OF BLADDER TUMOR WITH gemcitabine;  Surgeon: Abbie Sons, MD;  Location: [facility];  Service: Urology;  Laterality: N/A;   TUBAL LIGATION    :   Current Facility-Administered Medications  Medication Dose Route Frequency Provider Last Rate Last Admin   0.9 %  sodium chloride infusion  250 mL Intravenous PRN Pokhrel, Laxman, MD       acetaminophen (TYLENOL) tablet 650 mg  650 mg Oral Q6H PRN Pokhrel, Laxman, MD       Or   acetaminophen (TYLENOL) suppository 650 mg  650 mg Rectal Q6H PRN Pokhrel, Laxman, MD       albuterol (PROVENTIL) (2.5 MG/3ML) 0.083% nebulizer solution 2.5 mg  2.5 mg Nebulization Q4H PRN Pokhrel, Laxman, MD       amitriptyline (ELAVIL) tablet 50 mg  50 mg Oral QHS Pokhrel, Laxman, MD   50 mg at 05/08/21 2128   atorvastatin (LIPITOR) tablet 40 mg  40 mg Oral Daily Pokhrel, Laxman, MD       docusate sodium (COLACE) capsule 100 mg  100 mg Oral BID Pokhrel, Laxman, MD   100 mg at 05/08/21 2128   famotidine (PEPCID) tablet 20 mg  20 mg Oral Daily PRN Pokhrel, Laxman, MD       feeding supplement (GLUCERNA SHAKE) (GLUCERNA SHAKE) liquid 237 mL  237 mL Oral QHS Pokhrel, Laxman, MD   237 mL at 05/08/21 2128   heparin ADULT infusion  100 units/mL (25000 units/24mL)  900 Units/hr Intravenous Continuous Angela Adam, RPH 9 mL/hr at 05/09/21 0200 900 Units/hr at 05/09/21 0200   levETIRAcetam (KEPPRA) tablet 500 mg  500 mg Oral BID Pokhrel, Laxman, MD   500 mg at 05/08/21 2127   lisinopril (ZESTRIL) tablet 2.5 mg  2.5 mg Oral Daily Pokhrel, Laxman, MD       multivitamin with minerals tablet 1 tablet  1 tablet Oral Daily Pokhrel, Laxman, MD       ondansetron (ZOFRAN) tablet 4 mg  4 mg Oral Q6H PRN Pokhrel, Laxman, MD       Or   ondansetron (ZOFRAN) injection 4 mg  4 mg Intravenous Q6H PRN Pokhrel, Laxman, MD       polyethylene glycol (MIRALAX / GLYCOLAX) packet 17 g  17 g Oral Daily PRN Pokhrel, Laxman, MD       sodium chloride flush (NS) 0.9 % injection 3 mL  3  mL Intravenous Q12H Pokhrel, Laxman, MD   3 mL at 05/08/21 2129   sodium chloride flush (NS) 0.9 % injection 3 mL  3 mL Intravenous PRN Pokhrel, Laxman, MD       vitamin B-12 (CYANOCOBALAMIN) tablet 1,000 mcg  1,000 mcg Oral Daily Pokhrel, Laxman, MD          Allergies  Allergen Reactions   Codeine Nausea Only  :   Family History  Problem Relation Age of Onset   Diabetes Mother    Hypothyroidism Mother    Cancer Sister 15       BREAST   Breast cancer Sister    Hypothyroidism Sister    Cancer Maternal Grandmother        COLON   Colon cancer Maternal Grandmother 86   Stomach cancer Neg Hx   :   Social History   Socioeconomic History   Marital status: Divorced    Spouse name: Not on file   Number of children: Not on file   Years of education: Not on file   Highest education level: Not on file  Occupational History   Not on file  Tobacco Use   Smoking status: Former    Packs/day: 0.50    Years: 40.00    Pack years: 20.00    Types: Cigarettes    Quit date: 04/29/2017    Years since quitting: 4.0   Smokeless tobacco: Never  Vaping Use   Vaping Use: Never used  Substance and Sexual Activity   Alcohol use: Not Currently    Comment:  occassionally   Drug use: No   Sexual activity: Not Currently  Other Topics Concern   Not on file  Social History Narrative   Not on file   Social Determinants of Health   Financial Resource Strain: Low Risk    Difficulty of Paying Living Expenses: Not hard at all  Food Insecurity: Not on file  Transportation Needs: Not on file  Physical Activity: Not on file  Stress: Not on file  Social Connections: Not on file  Intimate Partner Violence: Not on file  :  Review of Systems: A comprehensive 14 point review of systems was negative except as noted in the HPI.  Exam: Patient Vitals for the past 24 hrs:  BP Temp Temp src Pulse Resp SpO2 Height Weight  05/09/21 0607 125/62 98.7 F (37.1 C) Oral (!) 110 20 93 % -- --  05/09/21 0228 (!) 124/53 99.7 F (37.6 C) Oral (!) 109 16 94 % -- --  05/09/21 0100 -- -- -- -- (!) 21 -- -- --  05/09/21 0000 -- -- -- -- (!) 22 -- -- --  05/08/21 2300 -- -- -- -- (!) 23 -- -- --  05/08/21 2229 106/64 99.2 F (37.3 C) Oral (!) 110 -- 95 % -- --  05/08/21 2200 -- -- -- -- 18 -- -- --  05/08/21 2100 -- -- -- -- 19 -- -- --  05/08/21 2000 -- -- -- -- (!) 24 98 % -- --  05/08/21 1900 -- -- -- -- 18 -- -- --  05/08/21 1835 (!) 90/55 98 F (36.7 C) Oral (!) 106 (!) 24 96 % 4\' 10"  (1.473 m) 60.8 kg  05/08/21 1545 (!) 145/133 -- -- (!) 109 (!) 21 96 % -- --  05/08/21 1351 -- -- -- -- -- -- 4\' 10"  (1.473 m) 62.1 kg  05/08/21 1348 110/60 97.9 F (36.6 C) Oral (!) 108 (!) 21 97 % -- --  General:  well-nourished in no acute distress.   Eyes:  no scleral icterus.   ENT:  There were no oropharyngeal lesions.   Respiratory: diminished breath sounds, no wheezing Cardiovascular:  Regular rate and rhythm, S1/S2, without murmur, rub or gallop.  There was no pedal edema.   GI:  abdomen was soft, flat, nontender, nondistended, without organomegaly.     Skin exam was without echymosis, petichae.   Neuro exam was nonfocal. Patient was alert and oriented.   Attention was good.   Language was appropriate.  Mood was normal without depression.  Speech was not pressured.  Thought content was not tangential.     Lab Results  Component Value Date   WBC 8.2 05/09/2021   HGB 10.3 (L) 05/09/2021   HCT 32.0 (L) 05/09/2021   PLT 249 05/09/2021   GLUCOSE 172 (H) 05/09/2021   CHOL 222 (H) 10/11/2020   TRIG 144.0 10/11/2020   HDL 67.80 10/11/2020   LDLDIRECT 118.7 01/24/2013   LDLCALC 125 (H) 10/11/2020   ALT 21 05/09/2021   AST 27 05/09/2021   NA 136 05/09/2021   K 4.0 05/09/2021   CL 104 05/09/2021   CREATININE 0.56 05/09/2021   BUN 13 05/09/2021   CO2 24 05/09/2021    CT Chest W Contrast  Result Date: 05/08/2021 CLINICAL DATA:  76 year old female with history of small cell lung cancer. Staging examination. EXAM: CT CHEST WITH CONTRAST TECHNIQUE: Multidetector CT imaging of the chest was performed during intravenous contrast administration. RADIATION DOSE REDUCTION: This exam was performed according to the departmental dose-optimization program which includes automated exposure control, adjustment of the mA and/or kV according to patient size and/or use of iterative reconstruction technique. CONTRAST:  68mL OMNIPAQUE IOHEXOL 300 MG/ML  SOLN COMPARISON:  Chest CT 11/11/2020.  PET-CT 12/24/2020. FINDINGS: Cardiovascular: Filling defects are noted throughout the pulmonary arteries bilaterally, including a saddle embolus, as well as filling defects in the main pulmonary arteries bilaterally as well as segmental and subsegmental sized branches. The majority of these appear nonocclusive, although there may be some occlusive embolus in the right lower lobe. Heart size is normal. Right ventricular diameter estimated at 39 mm. Left ventricular diameter estimated at 33 mm. RV to LV ratio of 1.18. There is no significant pericardial fluid, thickening or pericardial calcification. There is aortic atherosclerosis, as well as atherosclerosis of the great vessels of  the mediastinum and the coronary arteries, including calcified atherosclerotic plaque in the left main, left anterior descending, left circumflex and right coronary arteries. Mediastinum/Nodes: No pathologically enlarged mediastinal or hilar lymph nodes. Small hiatal hernia. No axillary lymphadenopathy. Lungs/Pleura: There is again extensive mass-like architectural distortion throughout the right mid to lower lung, compatible with chronic postradiation mass-like fibrosis where there is associated traction bronchiectasis, very similar to prior studies. No acute consolidative airspace disease. No pleural effusions. Patchy areas of mild ground-glass attenuation and septal thickening are now noted throughout the lungs bilaterally. Several small pulmonary nodules are again noted, largest of which is in the right lower lobe measuring 7 x 6 mm (unchanged). Other smaller pulmonary nodules are also evident, some of which are new compared to the prior study. Examples of this include a 4 mm solid nodule in the left lower lobe (axial image 89 of series 4), and several ill-defined ground-glass attenuation nodules such as a 5 x 8 mm ground-glass attenuation nodule in the left lower lobe (axial image 99 of series 4). 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 Upper Abdomen: Aortic atherosclerosis. Diffuse low attenuation throughout the visualized hepatic parenchyma, indicative of a background of hepatic steatosis. Musculoskeletal: Old healed fractures of the lateral aspect of the seventh and eighth ribs bilaterally are incidentally noted. There are no aggressive appearing lytic or blastic lesions noted in the visualized portions of the skeleton. IMPRESSION: 1. Positive for acute PE with CT evidence of right heart strain (RV/LV Ratio = 1.18) consistent with at least submassive (intermediate risk) PE. The presence of  right heart strain has been associated with an increased risk of morbidity and mortality. Please refer to the "PE Focused" order set in EPIC. 2. Multiple new ill-defined ground-glass attenuation nodules, as well as several small solid-appearing nodules, in addition to widespread ill-defined areas of ground-glass attenuation and septal thickening in the lungs bilaterally. This is nonspecific, and could be seen in the setting of acute infection or drug reaction. Close attention on follow-up studies is recommended to ensure stability or resolution of these findings. 3. 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 4. Aortic atherosclerosis, in addition to left main and three-vessel coronary artery disease. Assessment for potential risk factor modification, dietary therapy or pharmacologic therapy may be warranted, if clinically indicated. 5. Hepatic steatosis. Critical Value/emergent results were called by telephone at the time of interpretation on 05/08/2021 at 12:21 pm to provider [facility] , who verbally acknowledged these results. Aortic Atherosclerosis (ICD10-I70.0). Electronically Signed   By: Vinnie Langton M.D.   On: 05/08/2021 12:33     CT Chest W Contrast  Result Date: 05/08/2021 CLINICAL DATA:  76 year old female with history of small cell lung cancer. Staging examination. EXAM: CT CHEST WITH CONTRAST TECHNIQUE: Multidetector CT imaging of the chest was performed during intravenous contrast administration. RADIATION DOSE REDUCTION: This exam was performed according to the departmental dose-optimization program which includes automated exposure control, adjustment of the mA and/or kV according to patient size and/or use of iterative reconstruction technique. CONTRAST:  10mL OMNIPAQUE IOHEXOL 300 MG/ML  SOLN COMPARISON:  Chest CT 11/11/2020.  PET-CT 12/24/2020. FINDINGS: Cardiovascular: Filling defects are noted throughout the pulmonary arteries  bilaterally, including a saddle embolus, as well as filling defects in the main pulmonary arteries bilaterally as well as segmental and subsegmental sized branches. The majority of these appear nonocclusive, although there may be some occlusive embolus in the right lower lobe. Heart size is normal. Right ventricular diameter estimated at 39 mm. Left ventricular diameter estimated at 33 mm. RV to LV ratio of 1.18. There is no significant pericardial fluid, thickening or pericardial calcification. There is aortic atherosclerosis, as well as atherosclerosis of the great vessels of the mediastinum and the coronary arteries, including calcified atherosclerotic plaque in the left main, left anterior descending, left circumflex and right coronary arteries. Mediastinum/Nodes: No pathologically enlarged mediastinal or hilar lymph nodes. Small hiatal hernia. No axillary lymphadenopathy. Lungs/Pleura: There is again extensive mass-like architectural distortion throughout the right mid to lower lung, compatible with chronic postradiation mass-like fibrosis where there is associated traction bronchiectasis, very similar to prior studies. No acute consolidative airspace disease. No pleural effusions. Patchy areas of mild ground-glass attenuation and septal thickening are now noted throughout the lungs bilaterally. Several small pulmonary nodules are again noted, largest of which is in the right lower lobe measuring 7 x 6 mm (unchanged). Other smaller pulmonary nodules are also evident, some of which are new compared to the prior  study. Examples of this include a 4 mm solid nodule in the left lower lobe (axial image 89 of series 4), and several ill-defined ground-glass attenuation nodules such as a 5 x 8 mm ground-glass attenuation nodule in the left lower lobe (axial image 99 of series 4). 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 Upper Abdomen: Aortic atherosclerosis. Diffuse low attenuation throughout the visualized hepatic parenchyma, indicative of a background of hepatic steatosis. Musculoskeletal: Old healed fractures of the lateral aspect of the seventh and eighth ribs bilaterally are incidentally noted. There are no aggressive appearing lytic or blastic lesions noted in the visualized portions of the skeleton. IMPRESSION: 1. Positive for acute PE with CT evidence of right heart strain (RV/LV Ratio = 1.18) consistent with at least submassive (intermediate risk) PE. The presence of right heart strain has been associated with an increased risk of morbidity and mortality. Please refer to the "PE Focused" order set in EPIC. 2. Multiple new ill-defined ground-glass attenuation nodules, as well as several small solid-appearing nodules, in addition to widespread ill-defined areas of ground-glass attenuation and septal thickening in the lungs bilaterally. This is nonspecific, and could be seen in the setting of acute infection or drug reaction. Close attention on follow-up studies is recommended to ensure stability or resolution of these findings. 3. 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 4. Aortic atherosclerosis, in addition to left main and three-vessel coronary artery disease. Assessment for potential risk factor modification, dietary therapy or pharmacologic therapy may be warranted, if clinically indicated. 5. Hepatic steatosis. Critical Value/emergent results were called by telephone at the time of interpretation on 05/08/2021 at 12:21 pm to provider [facility] , who verbally acknowledged these results. Aortic Atherosclerosis (ICD10-I70.0). Electronically Signed   By: Vinnie Langton M.D.   On: 05/08/2021 12:33    Assessment and Plan:  1.  Metastatic non-small cell lung cancer, adenocarcinoma 2.  Hemorrhagic brain metastases 3.  Acute pulmonary embolism  with right heart strain 4.  Diabetes mellitus, diet controlled 5.  Hyperlipidemia  -The patient will continue to follow with Dr. Julien Nordmann as an outpatient to discuss systemic treatment options. -She was found to have a new acute PE on CT scan.  She also has a history of hemorrhagic brain metastases.  Her case was discussed with neuro-oncology who recommended admission to the hospital for IV heparin with close monitoring of her condition.  If she is stable, then they would recommend switching to subcu Lovenox and possibly placing an IVC filter at some point.  This morning, she is stable on the heparin with no change in her neurologic status and recommend continuation of heparin for now. -Received a message this morning from her primary oncologist who also recommends repeating a CT of the head without contrast to evaluate for hemorrhagic brain metastasis now that she is on heparin see if there are any changes.  Thank you for this referral.   Mikey Bussing, DNP, AGPCNP-BC, AOCNP    ADDENDUM: I saw and examined Ms. Muise this morning.  Unfortunately, I suspect that this pulmonary embolism is secondary to her underlying malignancy.  She has had her brain metastasis treated.  She had radiosurgery for the brain metastasis.  I think this was completed on 03/19/2021.  Of note, she had a Doppler of her legs back in November.  Everything looked negative at that point.  It be worthwhile to repeat this.  She is on a heparin infusion.  I think is the safest way to try to treat this pulmonary embolism right now.  I think we can manage the anticoagulation easiest.  If there are no issues with respect to CNS changes that could suggest CNS bleeding, then we could certainly switch her over to an oral agent.    I just hate the fact that she has been in the hospital right now.  However, I believe that doing the heparin infusion is the best way to try to help with this embolism right now.  She seemed to well this  morning.  She was alert.  We will have to watch her labs.  Her hemoglobin is 10.3.  White cell count 8.2.  Platelet count 249,000.  I feel confident that she will be able to go home after the weekend.  Again, I do think that heparin infusion is the best way to try to treat this thrombus right now.  I know that she will get incredible care from all the staff up [address].  I appreciate their incredible compassion for the patients.   Lattie Haw, MD  Psalm 121:1-2

## 2021-05-09 NOTE — Progress Notes (Signed)
Bilateral lower extremity venous duplex has been completed. Preliminary results can be found in CV Proc through chart review.  Results were given to the patient's nurse, Mia Creek.  05/09/21 1:42 PM Katelyn Kennedy RVT

## 2021-05-09 NOTE — Progress Notes (Signed)
Initial Nutrition Assessment  DOCUMENTATION CODES:   Not applicable  INTERVENTION:  - will increase Glucerna Shake from once/day to BID, each supplement provides 220 kcal and 10 grams of protein. - will order 1 tablet multivitamin with minerals/day.   NUTRITION DIAGNOSIS:   Increased nutrient needs related to acute illness, cancer and cancer related treatments as evidenced by estimated needs.  GOAL:   Patient will meet greater than or equal to 90% of their needs  MONITOR:   PO intake, Supplement acceptance, Labs, Weight trends  REASON FOR ASSESSMENT:   Malnutrition Screening Tool  ASSESSMENT:   76 y.o. female medical history of lung cancer with brain metastasis (adenocarcinoma) s/p chemoradiation and craniotomy and resection of brain mass, peripheral vascular disease, DM which is diet-controlled, arthritis, HTN, and hiatal hernia. She presented to the ED from Oncology clinic due to finding of pulmonary embolism on CT. She reported fatigue and weakness to BLE and intrascapular pain x3-4 days.  No meal intakes documented since admission. She reports having an egg, a piece of toast, and a slice of orange for breakfast. This is a typical breakfast for her. She reports having soup, salad, or a sandwich for lunch, and dinner around 5PM is her largest/most substantial meal of the day. She drinks Glucerna Shake at bedtime as she finds it helps her sleep and helps with CBG control through the night.  Patient was seen by another [place] RD at the beginning of 03/2021. She shares that since that time she has had progressive weakness, especially on the L side, and that appetite has decreased. She reports having some intermittent mild nausea that resolves and does not cause much issue for her.   Weight yesterday was documented as both 134 lb and 137 lb. Weight has been stable at 133-137 lb since 11/26/2020.   Labs reviewed; CBG: 159 mg/dl.  Medications reviewed; 100 mg colace BID, 1000  mcg oral cyanocobalamin/day.     NUTRITION - FOCUSED PHYSICAL EXAM:  Flowsheet Row Most Recent Value  Orbital Region No depletion  Upper Arm Region Mild depletion  Thoracic and Lumbar Region Unable to assess  Buccal Region No depletion  Temple Region No depletion  Clavicle Bone Region Mild depletion  Clavicle and Acromion Bone Region No depletion  Scapular Bone Region Unable to assess  Dorsal Hand No depletion  Patellar Region No depletion  Anterior Thigh Region No depletion  Posterior Calf Region No depletion  Edema (RD Assessment) None  Hair Unable to assess  [knit hat on]  Eyes Reviewed  Mouth Reviewed  Skin Reviewed  Nails Reviewed       Diet Order:   Diet Order             Diet Carb Modified Fluid consistency: Thin; Room service appropriate? Yes  Diet effective now                   EDUCATION NEEDS:   No education needs have been identified at this time  Skin:  Skin Assessment: Reviewed RN Assessment  Last BM:  PTA/unknown  Height:   Ht Readings from Last 1 Encounters:  05/08/21 4\' 10"  (1.473 m)    Weight:   Wt Readings from Last 1 Encounters:  05/08/21 60.8 kg     Estimated Nutritional Needs:  Kcal:  1800-2000 kcal Protein:  90-105 grams Fluid:  >/= 1.8 L/day     Jarome Matin, MS, RD, LDN Inpatient Clinical Dietitian RD pager # available in [facility]  After hours/weekend pager #  available in [facility]

## 2021-05-09 NOTE — Plan of Care (Signed)
Plan of care reviewed with patient and pt expressed an understanding Problem: Health Behavior/Discharge Planning: Goal: Ability to manage health-related needs will improve Outcome: Progressing

## 2021-05-09 NOTE — Progress Notes (Signed)
ANTICOAGULATION CONSULT NOTE - follow up  Pharmacy Consult for IV heparin Indication: pulmonary embolus  Allergies  Allergen Reactions   Codeine Nausea Only    Patient Measurements: Height: 4\' 10"  (147.3 cm) Weight: 60.8 kg (134 lb) IBW/kg (Calculated) : 40.9 Heparin Dosing Weight: 54 kg  Vital Signs: Temp: 99.2 F (37.3 C) (01/26 2229) Temp Source: Oral (01/26 2229) BP: 106/64 (01/26 2229) Pulse Rate: 110 (01/26 2229)  Labs: Recent Labs    05/08/21 1427 05/08/21 1524 05/09/21 0005  HGB 10.9* 10.9* 10.3*  HCT 32.8* 32.0* 32.0*  PLT 247  --  249  HEPARINUNFRC  --   --  <0.10*  CREATININE 0.58 0.50  --   TROPONINIHS 4  --   --     Estimated Creatinine Clearance: 45.5 mL/min (by C-G formula based on SCr of 0.5 mg/dL).   Medical History: Past Medical History:  Diagnosis Date   Allergic rhinitis    Arthritis    Diabetes mellitus without complication (HCC)    diet control/no meds per pt   Dyspnea    Essential hypertension 04/11/2019   History of chicken pox    History of hiatal hernia    History of radiation therapy    right lung - 06/28/2017-08/11/2017  Dr Kyung Rudd   History of radiation therapy    SRS brain - 11/29/2020-12/04/2020  Dr Jenny Reichmann Moody/Matthew Tammi Klippel   History of radiation therapy    Whole brain - 03/10/2021-03/19/2021 Dr Gery Pray   lung ca dx'd 04/2017   Peripheral vascular disease ([place])    Pneumonia    PONV (postoperative nausea and vomiting)    Smoker     Medications:  Scheduled:  Infusions:  PRN:   Assessment: 76 year old white female with now metastatic non-small cell lung cancer presented from [facility] after having CT of chest done that showed she has a acute PE, submassive with right heart strain. To start IV heparin per pharmacy dosing. Baseline labs have yet to be drawn, last labs drawn on 04/28/21. Pt not on anticoagulation prior to admission  05/09/2021 HL < 0.1 subtherapeutic on 700 units/hr Hgb 10.3, Plts 249 Per RN no  interruption, no bleeding  Goal of Therapy:  Heparin level 0.3-0.7 units/ml Monitor platelets by anticoagulation protocol: Yes   Plan:  Bolus heparin 1500 units x 1 Increase heparin drip to to 900 units/hr Check heparin level in 8 hours  Daily CBC  Dolly Rias RPh 05/09/2021, 12:45 AM

## 2021-05-09 NOTE — Progress Notes (Signed)
Echocardiogram 2D Echocardiogram has been performed.  Arlyss Gandy 05/09/2021, 12:32 PM

## 2021-05-09 NOTE — Progress Notes (Signed)
Oncology Discharge Planning Admission Note  [facility] Address: [address], [place], [place] [address] Hours of Operation:  8am - 5pm, Monday - Friday  Clinic Contact Information:  [phone]  Oncology Care Team: Medical Oncologist:  [facility]  Dr. Julien Nordmann is aware of this hospital admission dated 05/08/21 and has assessed patient at the bedside.  The cancer center will follow Katelyn Kennedy inpatient care to assist with discharge planning as indicated by the oncologist.    Disclaimer:  This [place] note does not imply a formal consult request has been made by the admitting attending for this admission or there will be an inpatient consult completed by oncology.  Please request oncology consults as per standard process as indicated.

## 2021-05-10 LAB — CBC
HCT: 31.5 % — ABNORMAL LOW (ref 36.0–46.0)
Hemoglobin: 10.1 g/dL — ABNORMAL LOW (ref 12.0–15.0)
MCH: 29.4 pg (ref 26.0–34.0)
MCHC: 32.1 g/dL (ref 30.0–36.0)
MCV: 91.8 fL (ref 80.0–100.0)
Platelets: 275 10*3/uL (ref 150–400)
RBC: 3.43 MIL/uL — ABNORMAL LOW (ref 3.87–5.11)
RDW: 15.4 % (ref 11.5–15.5)
WBC: 7.6 10*3/uL (ref 4.0–10.5)
nRBC: 0 % (ref 0.0–0.2)

## 2021-05-10 LAB — HEPARIN LEVEL (UNFRACTIONATED): Heparin Unfractionated: 0.36 IU/mL (ref 0.30–0.70)

## 2021-05-10 NOTE — Evaluation (Signed)
Physical Therapy Evaluation Patient Details Name: Katelyn Kennedy MRN: [id number] DOB: Feb 11, 1946 Today's Date: 05/10/2021  History of Present Illness  Pt is a 76 y.o. female presenting to the hospital sent from the oncology clinic for findings of pulmonary embolism on the CT scan. Medical history of lung cancer with brain metastasis (adenocarcinoma) history of chemoradiation in the past, craniotomy and resection of brain mass, peripheral vascular disease, and diabetes.   Clinical Impression  Pt is a 76 y.o. female with above HPI resulting in the deficits listed below (see PT Problem List). Pt receives intermittent assist with ADLs and supervision for mobility when needed, has been using rollator past 3-4 months after having a fall. Pt performed sit to stand transfers with MIN A for power up to stand and cues for safe hand placement. Pt ambulated total of ~1ft with MIN guard progressing to supervision for safety and cues to maintain close proximity to RW. Pt reports she has had "around the clock" supervision from family and friends and will have assist upon d/c. Pt will benefit from skilled PT to maximize functional mobility to increase independence.         Recommendations for follow up therapy are one component of a multi-disciplinary discharge planning process, led by the attending physician.  Recommendations may be updated based on patient status, additional functional criteria and insurance authorization.  Follow Up Recommendations Home health PT    Assistance Recommended at Discharge Frequent or constant Supervision/Assistance  Patient can return home with the following  A little help with walking and/or transfers;A little help with bathing/dressing/bathroom;Assistance with cooking/housework;Assist for transportation;Help with stairs or ramp for entrance    Equipment Recommendations None recommended by PT  Recommendations for Other Services       Functional Status Assessment Patient  has had a recent decline in their functional status and demonstrates the ability to make significant improvements in function in a reasonable and predictable amount of time.     Precautions / Restrictions Precautions Precautions: Fall Restrictions Weight Bearing Restrictions: No      Mobility  Bed Mobility Overal bed mobility: Needs Assistance Bed Mobility: Supine to Sit     Supine to sit: Min guard, HOB elevated     General bed mobility comments: MIN guard to bring trunk to upright, increased time    Transfers Overall transfer level: Needs assistance Equipment used: Rolling walker (2 wheels) Transfers: Sit to/from Stand Sit to Stand: Min assist           General transfer comment: MIN A for power up with cues for safe hand placement    Ambulation/Gait Ambulation/Gait assistance: Min guard, Supervision Gait Distance (Feet): 65 Feet Assistive device: Rolling walker (2 wheels) Gait Pattern/deviations: Step-through pattern, Decreased stride length, Narrow base of support Gait velocity: decr     General Gait Details: Repeated cuing to maintain close proximity to RW to maximize safety, no overt LOB observed. Intermittent narrow BOS.  Stairs            Wheelchair Mobility    Modified Rankin (Stroke Patients Only)       Balance Overall balance assessment: Needs assistance Sitting-balance support: Feet supported Sitting balance-Leahy Scale: Good     Standing balance support: Bilateral upper extremity supported, During functional activity Standing balance-Leahy Scale: Fair Standing balance comment: able to stand statically without use of RW and supervision for safety  Pertinent Vitals/Pain Pain Assessment Pain Assessment: No/denies pain    Home Living Family/patient expects to be discharged to:: Private residence Living Arrangements: Alone Available Help at Discharge: Family;Friend(s) Type of Home: Apartment  (1st level) Home Access: Level entry       Home Layout: One level Home Equipment: Rollator (4 wheels);Shower seat;Hand held shower head;Grab bars - tub/shower;Grab bars - toilet Additional Comments: has family and friends who can help her at home. Past 2 weeks has had someone there 24hrs. Reports that she was at SNF for short term rehab    Prior Function Prior Level of Function : Needs assist;History of Falls (last six months)       Physical Assist : Mobility (physical);ADLs (physical) Mobility (physical): Bed mobility;Transfers;Gait ADLs (physical): Bathing;Dressing;IADLs Mobility Comments: Rollator for gait for past 3-4 months since having a fall. ADLs Comments: Intermittent assist     Hand Dominance   Dominant Hand: Right    Extremity/Trunk Assessment   Upper Extremity Assessment Upper Extremity Assessment: Generalized weakness    Lower Extremity Assessment Lower Extremity Assessment: Generalized weakness    Cervical / Trunk Assessment Cervical / Trunk Assessment: Normal  Communication   Communication: No difficulties  Cognition Arousal/Alertness: Awake/alert Behavior During Therapy: WFL for tasks assessed/performed                                            General Comments      Exercises     Assessment/Plan    PT Assessment Patient needs continued PT services  PT Problem List Decreased strength;Decreased activity tolerance;Decreased balance;Decreased mobility;Decreased knowledge of use of DME       PT Treatment Interventions DME instruction;Gait training;Functional mobility training;Therapeutic activities;Therapeutic exercise;Balance training;Patient/family education    PT Goals (Current goals can be found in the Care Plan section)  Acute Rehab PT Goals Patient Stated Goal: Be up and moving PT Goal Formulation: With patient Time For Goal Achievement: 05/24/21 Potential to Achieve Goals: Good    Frequency Min 3X/week      Co-evaluation               AM-PAC PT "6 Clicks" Mobility  Outcome Measure Help needed turning from your back to your side while in a flat bed without using bedrails?: A Little Help needed moving from lying on your back to sitting on the side of a flat bed without using bedrails?: A Little Help needed moving to and from a bed to a chair (including a wheelchair)?: A Little Help needed standing up from a chair using your arms (e.g., wheelchair or bedside chair)?: A Little Help needed to walk in hospital room?: A Little Help needed climbing 3-5 steps with a railing? : A Lot 6 Click Score: 17    End of Session Equipment Utilized During Treatment: Gait belt Activity Tolerance: Patient tolerated treatment well Patient left: in chair;with call bell/phone within reach Nurse Communication: Mobility status PT Visit Diagnosis: Unsteadiness on feet (R26.81);Muscle weakness (generalized) (M62.81);History of falling (Z91.81)    Time: 8889-1694 PT Time Calculation (min) (ACUTE ONLY): 26 min   Charges:   PT Evaluation $PT Eval Low Complexity: 1 Low PT Treatments $Therapeutic Activity: 8-22 mins        Festus Barren PT, DPT  Acute Rehabilitation Services  Office [phone]  05/10/2021, 1:07 PM

## 2021-05-10 NOTE — Progress Notes (Signed)
ANTICOAGULATION CONSULT NOTE - Follow Up  Pharmacy Consult for IV heparin Indication: pulmonary embolus  Allergies  Allergen Reactions   Codeine Nausea Only    Patient Measurements: Height: 4\' 10"  (147.3 cm) Weight: 60.8 kg (134 lb) IBW/kg (Calculated) : 40.9 Heparin Dosing Weight: 54 kg  Vital Signs: Temp: 98.9 F (37.2 C) (01/28 0413) Temp Source: Oral (01/28 0413) BP: 121/62 (01/28 0413) Pulse Rate: 108 (01/28 0413)  Labs: Recent Labs    05/08/21 1427 05/08/21 1524 05/08/21 1524 05/09/21 0005 05/09/21 0841 05/09/21 1630 05/10/21 0407  HGB 10.9* 10.9*  --  10.3*  --   --  10.1*  HCT 32.8* 32.0*  --  32.0*  --   --  31.5*  PLT 247  --   --  249  --   --  275  HEPARINUNFRC  --   --    < > <0.10* 0.48 0.49 0.36  CREATININE 0.58 0.50  --  0.56  --   --   --   TROPONINIHS 4  --   --   --   --   --   --    < > = values in this interval not displayed.     Estimated Creatinine Clearance: 45.5 mL/min (by C-G formula based on SCr of 0.56 mg/dL).   Medical History: Past Medical History:  Diagnosis Date   Allergic rhinitis    Arthritis    Diabetes mellitus without complication ([place])    diet control/no meds per pt   Dyspnea    Essential hypertension 04/11/2019   History of chicken pox    History of hiatal hernia    History of radiation therapy    right lung - 06/28/2017-08/11/2017  Dr Kyung Rudd   History of radiation therapy    SRS brain - 11/29/2020-12/04/2020  Dr Jenny Reichmann Moody/Matthew Tammi Klippel   History of radiation therapy    Whole brain - 03/10/2021-03/19/2021 Dr Gery Pray   lung ca dx'd 04/2017   Lung cancer ([place])    Peripheral vascular disease ([place])    Pneumonia    PONV (postoperative nausea and vomiting)    Smoker     Medications: Pt not on anticoagulants PTA  Assessment: Pt is a 20 yoF with 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 Presented from cancer center after chest CT revealed she has a acute PE, submassive with right heart strain. Pharmacy  consulted to dose heparin. Per H&P - MD discussed anticoagulation with Dr Mickeal Skinner as pt has some hemorrhagic metastasis - recommended heparin drip on admission.  Confirmed with Dr. Mickeal Skinner on 1/27 that it is ok to bolus heparin if needed.  Today, 05/10/21 HL = 0.36 remains therapeutic on heparin infusion of 900 units/hr CBC: Hgb slightly low but stable; Plt WNL Confirmed with RN that heparin infusing at correct rate. No signs of bleeding. No neurologic changes.   Goal of Therapy:  Heparin level 0.3-0.7 units/ml Monitor platelets by anticoagulation protocol: Yes   Plan:  Continue heparin infusion at current rate of 900 units/hr HL, CBC daily while on heparin drip Monitor for signs of bleeding Follow along for eventual transition to PO or subcutaneous anticoagulation  Lenis Noon, PharmD 05/10/2021,9:28 AM

## 2021-05-10 NOTE — Progress Notes (Signed)
Looks like she has a blood clot in her left leg.  I suspect this is where the pulmonary embolism came from..  She is on heparin.  She is doing okay.  She has a cough.  There is no hemoptysis.  There is no obvious neurological issues so far.  Her CBC showed a white cell count of 7.6.  Hemoglobin 10.1.  Platelet count 275,000.  She definitely needs to be little more active.  She is to be sitting in a chair.  She needs to ambulate.  Her appetite is doing okay.  There is no nausea or vomiting.  She has had no headache.  There is no blurred vision.  She does not feel short of breath.  Again she just has is cough.  Her vital signs show temperature 98.9.  Pulse 108.  Blood pressure 121/62.  Her lungs are clear bilaterally.  I do not hear any friction rub.  There is no wheezing.  Cardiac exam regular rate and rhythm.  There are no murmurs.  Abdomen is soft.  Bowel sounds are present.  Extremities shows no obvious leg swelling.  There is no tenderness to palpation over her legs.  She has good pulses in her distal extremities.  Neurological exam shows no focal neurological deficits.  Ms. Mcevers has extensive stage small cell lung cancer.  She had brain metastasis.  She had radiation for this.  She now has a pulmonary embolism.  She has a thrombus in the left leg.  She is on heparin.  I would keep her on heparin over the weekend and then switch her over to an oral agent.  I think this would be reasonable.  Dr. Julien Nordmann will be back on Monday so we will be able to see her and make additional recommendations.  I do appreciate the incredible care that she is received from everybody up on 4 W.   Lattie Haw, MD  [place] 12:12

## 2021-05-10 NOTE — Progress Notes (Addendum)
PROGRESS NOTE  Katelyn Kennedy JSH:[id number] DOB: 1946/02/19 DOA: 05/08/2021 PCP: [name], MD   LOS: 2 days   Brief narrative:  Katelyn Kennedy is a 76 y.o. female medical history of lung cancer with brain metastasis (adenocarcinoma) history of chemoradiation in the past, craniotomy and resection of brain mass, peripheral vascular disease, diabetes diet-controlled, presented to the hospital sent from the oncology clinic for findings of pulmonary embolism on the CT scan.  Patient has been feeling of fatigue and weak in her lower extremities recently and complained of some intrascapular pain 3 to 4 days back.  In the ED, patient had elevated blood pressure with mild tachycardia at 108.  Hemoglobin was 10.9.  BMP within normal range.  CT scan done today at 10:30 was positive for acute pulmonary [place] with mild right heart strain consistent with possible submassive PE.  Multiple new ill-defined groundglass attenuation nodules in the lungs. .  Patient was hemodynamically stable and did not require any oxygen.  Troponin was not elevated.  Patient had some hemorrhagic metastasis and this was discussed with Dr. Mickeal Skinner neuro oncologist who recommended heparin drip for anticoagulation. Patient was then admitted to the hospital for further evaluation  Assessment/Plan:  Principal Problem:   Acute pulmonary embolism ([place]) Active Problems:   Type 2 diabetes mellitus without complication, without long-term current use of insulin ([place])   Hyperlipidemia associated with type 2 diabetes mellitus ([place])   History of lung cancer   Non-small cell lung cancer metastatic to brain ([place])   Acute pulmonary embolism.   Diagnosed on CT chest scan of the chest.  Neuro oncology and oncology recommended heparin drip due to hemorrhagic metastasis and need for closer monitoring. Troponin was within normal range.  CTA described some right heart strain patient remained hemodynamically stable with normal troponins..  2D  echocardiogram showed LV ejection fraction of 60 to 65% with grade 1 diastolic dysfunction.  Continue heparin drip over the weekend.  History of lung cancer metastatic to the brain.  Being followed by oncology and neuro-oncology as outpatient.  Oncology following while in hospital.   Diabetes mellitus type 2 not on medication.  Diet controlled.  We will continue to monitor while in the hospital.  Accu-Cheks and if necessary sliding scale scale insulin    History of hyperlipidemia.  Continue with Lipitor.   Generalized weakness, fatigue.  PT evaluation pending.  DVT prophylaxis:   Heparin drip.   Code Status: DNR  Family Communication:  Spoke with the patient's daughter-in-law on 05/08/2021.  I tried to reach the patient's sister over the phone as well but was unable to reach her today.  Status is: Inpatient  Remains inpatient appropriate because: IV heparin drip, need for closer monitoring.    Consultants: Oncology  Procedures: None yet  Anti-infectives:  None  Anti-infectives (From admission, onward)    None       Subjective: Today, patient was seen and examined at bedside.  Patient denies any dizziness lightheadedness shortness of breath chest pain or palpitation .  Complains of mild twitches over the body.  Objective: Vitals:   05/10/21 0020 05/10/21 0413  BP: 109/62 121/62  Pulse: (!) 106 (!) 108  Resp: 20 20  Temp: 97.7 F (36.5 C) 98.9 F (37.2 C)  SpO2: 96% 95%    Intake/Output Summary (Last 24 hours) at 05/10/2021 1155 Last data filed at 05/10/2021 0841 Gross per 24 hour  Intake 944.09 ml  Output 500 ml  Net 444.09 ml  Filed Weights   05/08/21 1351 05/08/21 1835  Weight: 62.1 kg 60.8 kg   Body mass index is 28.01 kg/m.   Physical Exam:  General:  Average built, not in obvious distress HENT:   No scleral pallor or icterus noted. Oral mucosa is moist.  Chest:  Clear breath sounds.  Diminished breath sounds bilaterally. No crackles or  wheezes.  CVS: S1 &S2 heard. No murmur.  Regular rate and rhythm. Abdomen: Soft, nontender, nondistended.  Bowel sounds are heard.   Extremities: No cyanosis, clubbing or edema.  Peripheral pulses are palpable. Psych: Alert, awake and oriented, normal mood CNS:  No cranial nerve deficits.  Power equal in all extremities.   Skin: Warm and dry.  No rashes noted.   Data Review: I have personally reviewed the following laboratory data and studies,  CBC: Recent Labs  Lab 05/08/21 1427 05/08/21 1524 05/09/21 0005 05/10/21 0407  WBC 8.2  --  8.2 7.6  HGB 10.9* 10.9* 10.3* 10.1*  HCT 32.8* 32.0* 32.0* 31.5*  MCV 91.9  --  94.4 91.8  PLT 247  --  249 956    Basic Metabolic Panel: Recent Labs  Lab 05/08/21 1427 05/08/21 1524 05/09/21 0005  NA 136 136 136  K 4.0 4.0 4.0  CL 103 102 104  CO2 26  --  24  GLUCOSE 107* 106* 172*  BUN 10 8 13   CREATININE 0.58 0.50 0.56  CALCIUM 9.4  --  8.8*  MG  --   --  2.0  PHOS  --   --  3.7    Liver Function Tests: Recent Labs  Lab 05/09/21 0005  AST 27  ALT 21  ALKPHOS 110  BILITOT 0.5  PROT 6.3*  ALBUMIN 2.9*    No results for input(s): LIPASE, AMYLASE in the last 168 hours. No results for input(s): AMMONIA in the last 168 hours. Cardiac Enzymes: No results for input(s): CKTOTAL, CKMB, CKMBINDEX, TROPONINI in the last 168 hours. BNP (last 3 results) No results for input(s): BNP in the last 8760 hours.  ProBNP (last 3 results) No results for input(s): PROBNP in the last 8760 hours.  CBG: Recent Labs  Lab 05/09/21 0745 05/09/21 1229  GLUCAP 159* 130*    Recent Results (from the past 240 hour(s))  Resp Panel by RT-PCR (Flu A&B, Covid) Nasopharyngeal Swab     Status: None   Collection Time: 05/08/21  4:51 PM   Specimen: Nasopharyngeal Swab; Nasopharyngeal(NP) swabs in vial transport medium  Result Value Ref Range Status   SARS Coronavirus 2 by RT PCR NEGATIVE NEGATIVE Final    Comment: (NOTE) SARS-CoV-2 target  nucleic acids are NOT DETECTED.  The SARS-CoV-2 RNA is generally detectable in upper respiratory specimens during the acute phase of infection. The lowest concentration of SARS-CoV-2 viral copies this assay can detect is 138 copies/mL. A negative result does not preclude SARS-Cov-2 infection and should not be used as the sole basis for treatment or other patient management decisions. A negative result may occur with  improper specimen collection/handling, submission of specimen other than nasopharyngeal swab, presence of viral mutation(s) within the areas targeted by this assay, and inadequate number of viral copies(<138 copies/mL). A negative result must be combined with clinical observations, patient history, and epidemiological information. The expected result is Negative.  Fact Sheet for Patients:  EntrepreneurPulse.com.au  Fact Sheet for Healthcare Providers:  IncredibleEmployment.be  This test is no t yet approved or cleared by the [place] and  has been authorized  for detection and/or diagnosis of SARS-CoV-2 by FDA under an Emergency Use Authorization (EUA). This EUA will remain  in effect (meaning this test can be used) for the duration of the COVID-19 declaration under Section 564(b)(1) of the Act, 21 U.S.C.section 360bbb-3(b)(1), unless the authorization is terminated  or revoked sooner.       Influenza A by PCR NEGATIVE NEGATIVE Final   Influenza B by PCR NEGATIVE NEGATIVE Final    Comment: (NOTE) The Xpert Xpress SARS-CoV-2/FLU/RSV plus assay is intended as an aid in the diagnosis of influenza from Nasopharyngeal swab specimens and should not be used as a sole basis for treatment. Nasal washings and aspirates are unacceptable for Xpert Xpress SARS-CoV-2/FLU/RSV testing.  Fact Sheet for Patients: EntrepreneurPulse.com.au  Fact Sheet for Healthcare  Providers: IncredibleEmployment.be  This test is not yet approved or cleared by the [place] FDA and has been authorized for detection and/or diagnosis of SARS-CoV-2 by FDA under an Emergency Use Authorization (EUA). This EUA will remain in effect (meaning this test can be used) for the duration of the COVID-19 declaration under Section 564(b)(1) of the Act, 21 U.S.C. section 360bbb-3(b)(1), unless the authorization is terminated or revoked.  Performed at [facility], [place] [address] [place], [place] [address]       Studies: CT Chest W Contrast  Result Date: 05/08/2021 CLINICAL DATA:  77 year old female with history of small cell lung cancer. Staging examination. EXAM: CT CHEST WITH CONTRAST TECHNIQUE: Multidetector CT imaging of the chest was performed during intravenous contrast administration. RADIATION DOSE REDUCTION: This exam was performed according to the departmental dose-optimization program which includes automated exposure control, adjustment of the mA and/or kV according to patient size and/or use of iterative reconstruction technique. CONTRAST:  63mL OMNIPAQUE IOHEXOL 300 MG/ML  SOLN COMPARISON:  Chest CT 11/11/2020.  PET-CT 12/24/2020. FINDINGS: Cardiovascular: Filling defects are noted throughout the pulmonary arteries bilaterally, including a saddle embolus, as well as filling defects in the main pulmonary arteries bilaterally as well as segmental and subsegmental sized branches. The majority of these appear nonocclusive, although there may be some occlusive embolus in the right lower lobe. Heart size is normal. Right ventricular diameter estimated at 39 mm. Left ventricular diameter estimated at 33 mm. RV to LV ratio of 1.18. There is no significant pericardial fluid, thickening or pericardial calcification. There is aortic atherosclerosis, as well as atherosclerosis of the great vessels of the mediastinum and the coronary arteries,  including calcified atherosclerotic plaque in the left main, left anterior descending, left circumflex and right coronary arteries. Mediastinum/Nodes: No pathologically enlarged mediastinal or hilar lymph nodes. Small hiatal hernia. No axillary lymphadenopathy. Lungs/Pleura: There is again extensive mass-like architectural distortion throughout the right mid to lower lung, compatible with chronic postradiation mass-like fibrosis where there is associated traction bronchiectasis, very similar to prior studies. No acute consolidative airspace disease. No pleural effusions. Patchy areas of mild ground-glass attenuation and septal thickening are now noted throughout the lungs bilaterally. Several small pulmonary nodules are again noted, largest of which is in the right lower lobe measuring 7 x 6 mm (unchanged). Other smaller pulmonary nodules are also evident, some of which are new compared to the prior study. Examples of this include a 4 mm solid nodule in the left lower lobe (axial image 89 of series 4), and several ill-defined ground-glass attenuation nodules such as a 5 x 8 mm ground-glass attenuation nodule in the left lower lobe (axial image 99 of series 4). 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 Upper Abdomen: Aortic atherosclerosis. Diffuse low attenuation throughout the visualized hepatic parenchyma, indicative of a background of hepatic steatosis. Musculoskeletal: Old healed fractures of the lateral aspect of the seventh and eighth ribs bilaterally are incidentally noted. There are no aggressive appearing lytic or blastic lesions noted in the visualized portions of the skeleton. IMPRESSION: 1. Positive for acute PE with CT evidence of right heart strain (RV/LV Ratio = 1.18) consistent with at least submassive (intermediate risk) PE. The presence of right heart strain has been associated with  an increased risk of morbidity and mortality. Please refer to the "PE Focused" order set in EPIC. 2. Multiple new ill-defined ground-glass attenuation nodules, as well as several small solid-appearing nodules, in addition to widespread ill-defined areas of ground-glass attenuation and septal thickening in the lungs bilaterally. This is nonspecific, and could be seen in the setting of acute infection or drug reaction. Close attention on follow-up studies is recommended to ensure stability or resolution of these findings. 3. 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 4. Aortic atherosclerosis, in addition to left main and three-vessel coronary artery disease. Assessment for potential risk factor modification, dietary therapy or pharmacologic therapy may be warranted, if clinically indicated. 5. Hepatic steatosis. Critical Value/emergent results were called by telephone at the time of interpretation on 05/08/2021 at 12:21 pm to provider [facility] , who verbally acknowledged these results. Aortic Atherosclerosis (ICD10-I70.0). Electronically Signed   By: Vinnie Langton M.D.   On: 05/08/2021 12:33   ECHOCARDIOGRAM COMPLETE  Result Date: 05/09/2021    ECHOCARDIOGRAM REPORT   Patient Name:   Katelyn Kennedy Date of Exam: 05/09/2021 Medical Rec #:  [id number]       Height:       58.0 in Accession #:    [id number]      Weight:       134.0 lb Date of Birth:  1946-01-26      BSA:          1.536 m Patient Age:    59 years        BP:           125/62 mmHg Patient Gender: F               HR:           110 bpm. Exam Location:  Inpatient Procedure: 2D Echo Indications:    Pulmonary embolism  History:        Patient has no prior history of Echocardiogram examinations.                 Signs/Symptoms:Shortness of Breath; Risk Factors:Diabetes and                 Hypertension.  Sonographer:    Arlyss Gandy Referring Phys: [id number] [place]  1. Left  ventricular ejection fraction, by estimation, is 60 to 65%. The left ventricle has normal function. The left ventricle has no regional wall motion abnormalities. Left ventricular diastolic parameters are consistent with Grade I diastolic dysfunction (impaired relaxation).  2. Right ventricular systolic function is normal. The right ventricular size is normal. Tricuspid regurgitation signal is inadequate for assessing PA pressure.  3. The mitral valve is normal in structure. No evidence of mitral valve regurgitation. No evidence of mitral stenosis.  4. The aortic valve is normal in structure.  Aortic valve regurgitation is not visualized. No aortic stenosis is present.  5. The inferior vena cava is normal in size with greater than 50% respiratory variability, suggesting right atrial pressure of 3 mmHg. FINDINGS  Left Ventricle: Left ventricular ejection fraction, by estimation, is 60 to 65%. The left ventricle has normal function. The left ventricle has no regional wall motion abnormalities. The left ventricular internal cavity size was normal in size. There is  no left ventricular hypertrophy. Left ventricular diastolic parameters are consistent with Grade I diastolic dysfunction (impaired relaxation). Normal left ventricular filling pressure. Right Ventricle: The right ventricular size is normal. No increase in right ventricular wall thickness. Right ventricular systolic function is normal. Tricuspid regurgitation signal is inadequate for assessing PA pressure. Left Atrium: Left atrial size was normal in size. Right Atrium: Right atrial size was normal in size. Pericardium: There is no evidence of pericardial effusion. Mitral Valve: The mitral valve is normal in structure. No evidence of mitral valve regurgitation. No evidence of mitral valve stenosis. Tricuspid Valve: The tricuspid valve is normal in structure. Tricuspid valve regurgitation is not demonstrated. No evidence of tricuspid stenosis. Aortic Valve: The  aortic valve is normal in structure. Aortic valve regurgitation is not visualized. No aortic stenosis is present. Aortic valve mean gradient measures 3.0 mmHg. Aortic valve peak gradient measures 5.5 mmHg. Aortic valve area, by VTI measures 3.02 cm. Pulmonic Valve: The pulmonic valve was normal in structure. Pulmonic valve regurgitation is not visualized. No evidence of pulmonic stenosis. Aorta: The aortic root is normal in size and structure. Venous: The inferior vena cava is normal in size with greater than 50% respiratory variability, suggesting right atrial pressure of 3 mmHg. IAS/Shunts: No atrial level shunt detected by color flow Doppler.  LEFT VENTRICLE PLAX 2D LVIDd:         3.60 cm   Diastology LVIDs:         2.40 cm   LV e' medial:    6.64 cm/s LV PW:         0.80 cm   LV E/e' medial:  8.6 LV IVS:        0.80 cm   LV e' lateral:   5.77 cm/s LVOT diam:     2.00 cm   LV E/e' lateral: 9.9 LV SV:         57 LV SV Index:   37 LVOT Area:     3.14 cm  RIGHT VENTRICLE             IVC RV Basal diam:  2.40 cm     IVC diam: 1.80 cm RV Mid diam:    2.00 cm RV S prime:     15.60 cm/s TAPSE (M-mode): 1.5 cm LEFT ATRIUM             Index        RIGHT ATRIUM          Index LA diam:        3.10 cm 2.02 cm/m   RA Area:     6.78 cm LA Vol (A2C):   23.5 ml 15.30 ml/m  RA Volume:   9.44 ml  6.15 ml/m LA Vol (A4C):   22.6 ml 14.71 ml/m LA Biplane Vol: 23.4 ml 15.23 ml/m  AORTIC VALVE AV Area (Vmax):    2.82 cm AV Area (Vmean):   2.79 cm AV Area (VTI):     3.02 cm AV Vmax:  117.00 cm/s AV Vmean:          80.200 cm/s AV VTI:            0.188 m AV Peak Grad:      5.5 mmHg AV Mean Grad:      3.0 mmHg LVOT Vmax:         105.00 cm/s LVOT Vmean:        71.100 cm/s LVOT VTI:          0.181 m LVOT/AV VTI ratio: 0.96  AORTA Ao Root diam: 2.60 cm Ao Asc diam:  2.30 cm MITRAL VALVE MV Area (PHT): 5.23 cm    SHUNTS MV Decel Time: 145 msec    Systemic VTI:  0.18 m MV E velocity: 57.40 cm/s  Systemic Diam: 2.00 cm MV A  velocity: 89.50 cm/s MV E/A ratio:  0.64 Fransico Him MD Electronically signed by Fransico Him MD Signature Date/Time: 05/09/2021/12:41:01 PM    Final    VAS [name] LOWER EXTREMITY VENOUS (DVT)  Result Date: 05/09/2021  Lower Venous DVT Study Patient Name:  Katelyn Kennedy  Date of Exam:   05/09/2021 Medical Rec #: [id number]        Accession #:    [id number] Date of Birth: 30-Aug-1945       Patient Gender: F Patient Age:   4 years Exam Location:  [facility] Procedure:      VAS [name] LOWER EXTREMITY VENOUS (DVT) Referring Phys: Burney Gauze --------------------------------------------------------------------------------  Indications: Pulmonary embolism.  Risk Factors: Confirmed PE. Anticoagulation: Heparin. Limitations: Patient positioning. Comparison Study: No prior studies. Performing Technologist: Oliver Hum RVT  Examination Guidelines: A complete evaluation includes B-mode imaging, spectral Doppler, color Doppler, and power Doppler as needed of all accessible portions of each vessel. Bilateral testing is considered an integral part of a complete examination. Limited examinations for reoccurring indications may be performed as noted. The reflux portion of the exam is performed with the patient in reverse Trendelenburg.  +---------+---------------+---------+-----------+----------+--------------+  RIGHT     Compressibility Phasicity Spontaneity Properties Thrombus Aging  +---------+---------------+---------+-----------+----------+--------------+  CFV       Full            Yes       Yes                                    +---------+---------------+---------+-----------+----------+--------------+  SFJ       Full                                                             +---------+---------------+---------+-----------+----------+--------------+  FV Prox   Full                                                             +---------+---------------+---------+-----------+----------+--------------+  FV Mid     Full                                                             +---------+---------------+---------+-----------+----------+--------------+  FV Distal Full                                                             +---------+---------------+---------+-----------+----------+--------------+  PFV       Full                                                             +---------+---------------+---------+-----------+----------+--------------+  POP       Full            Yes       Yes                                    +---------+---------------+---------+-----------+----------+--------------+  PTV       Full                                                             +---------+---------------+---------+-----------+----------+--------------+  PERO      Full                                                             +---------+---------------+---------+-----------+----------+--------------+   +---------+---------------+---------+-----------+----------+--------------+  LEFT      Compressibility Phasicity Spontaneity Properties Thrombus Aging  +---------+---------------+---------+-----------+----------+--------------+  CFV       Full            Yes       Yes                                    +---------+---------------+---------+-----------+----------+--------------+  SFJ       Full                                                             +---------+---------------+---------+-----------+----------+--------------+  FV Prox   Full                                                             +---------+---------------+---------+-----------+----------+--------------+  FV Mid    Full                                                             +---------+---------------+---------+-----------+----------+--------------+  FV Distal Full                                                             +---------+---------------+---------+-----------+----------+--------------+  PFV       Full                                                              +---------+---------------+---------+-----------+----------+--------------+  POP       Partial         Yes       Yes                    Acute           +---------+---------------+---------+-----------+----------+--------------+  PTV       Full                                                             +---------+---------------+---------+-----------+----------+--------------+  PERO      None                                             Acute           +---------+---------------+---------+-----------+----------+--------------+  Gastroc   Full                                                             +---------+---------------+---------+-----------+----------+--------------+    Summary: RIGHT: - There is no evidence of deep vein thrombosis in the lower extremity.  - No cystic structure found in the popliteal fossa.  LEFT: - Findings consistent with acute deep vein thrombosis involving the left popliteal vein, and left peroneal veins. - No cystic structure found in the popliteal fossa.  *See table(s) above for measurements and observations.    Preliminary       Flora Lipps, MD  [facility] 05/10/2021  If 7PM-7AM, please contact night-coverage

## 2021-05-11 DIAGNOSIS — I82402 Acute embolism and thrombosis of unspecified deep veins of left lower extremity: Secondary | ICD-10-CM

## 2021-05-11 LAB — HEPARIN LEVEL (UNFRACTIONATED)
Heparin Unfractionated: 0.24 IU/mL — ABNORMAL LOW (ref 0.30–0.70)
Heparin Unfractionated: 0.35 IU/mL (ref 0.30–0.70)
Heparin Unfractionated: 0.36 IU/mL (ref 0.30–0.70)

## 2021-05-11 LAB — CBC
HCT: 35 % — ABNORMAL LOW (ref 36.0–46.0)
Hemoglobin: 11.1 g/dL — ABNORMAL LOW (ref 12.0–15.0)
MCH: 29.4 pg (ref 26.0–34.0)
MCHC: 31.7 g/dL (ref 30.0–36.0)
MCV: 92.6 fL (ref 80.0–100.0)
Platelets: 321 10*3/uL (ref 150–400)
RBC: 3.78 MIL/uL — ABNORMAL LOW (ref 3.87–5.11)
RDW: 15.6 % — ABNORMAL HIGH (ref 11.5–15.5)
WBC: 8.1 10*3/uL (ref 4.0–10.5)
nRBC: 0 % (ref 0.0–0.2)

## 2021-05-11 MED ORDER — GUAIFENESIN-DM 100-10 MG/5ML PO SYRP
5.0000 mL | ORAL_SOLUTION | ORAL | Status: DC | PRN
  Administered 2021-05-11: 5 mL via ORAL
  Filled 2021-05-11: qty 10

## 2021-05-11 NOTE — Progress Notes (Signed)
ANTICOAGULATION CONSULT NOTE - Follow Up  Pharmacy Consult for IV heparin Indication: pulmonary embolus  Allergies  Allergen Reactions   Codeine Nausea Only    Patient Measurements: Height: 4\' 10"  (147.3 cm) Weight: 59.9 kg (132 lb 0.9 oz) IBW/kg (Calculated) : 40.9 Heparin Dosing Weight: 54 kg  Vital Signs: Temp: 98.2 F (36.8 C) (01/29 1338) Temp Source: Oral (01/29 1338) BP: 120/66 (01/29 1338) Pulse Rate: 106 (01/29 1338)  Labs: Recent Labs    05/09/21 0005 05/09/21 0841 05/10/21 0407 05/11/21 0356 05/11/21 1321  HGB 10.3*  --  10.1* 11.1*  --   HCT 32.0*  --  31.5* 35.0*  --   PLT 249  --  275 321  --   HEPARINUNFRC <0.10*   < > 0.36 0.24* 0.35  CREATININE 0.56  --   --   --   --    < > = values in this interval not displayed.     Estimated Creatinine Clearance: 45.1 mL/min (by C-G formula based on SCr of 0.56 mg/dL).   Medical History: Past Medical History:  Diagnosis Date   Allergic rhinitis    Arthritis    Diabetes mellitus without complication ([place])    diet control/no meds per pt   Dyspnea    Essential hypertension 04/11/2019   History of chicken pox    History of hiatal hernia    History of radiation therapy    right lung - 06/28/2017-08/11/2017  Dr Kyung Rudd   History of radiation therapy    SRS brain - 11/29/2020-12/04/2020  Dr Jenny Reichmann Moody/Matthew Tammi Klippel   History of radiation therapy    Whole brain - 03/10/2021-03/19/2021 Dr Gery Pray   lung ca dx'd 04/2017   Lung cancer ([place])    Peripheral vascular disease ([place])    Pneumonia    PONV (postoperative nausea and vomiting)    Smoker     Medications: Pt not on anticoagulants PTA  Assessment: Pt is a 20 yoF with 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 Presented from cancer center after chest CT revealed she has a acute PE, submassive with right heart strain. Pharmacy consulted to dose heparin. Per H&P - MD discussed anticoagulation with Dr Mickeal Skinner as pt has some hemorrhagic metastasis -  recommended heparin drip on admission.  Confirmed with Dr. Mickeal Skinner on 1/27 that it is ok to bolus heparin if needed.  Today, 05/11/21 HL = 0.35 is therapeutic on heparin infusion of 1050 units/hr CBC: Hgb slightly low but stable; Plt WNL Confirmed with RN that heparin infusing at correct rate. No signs of bleeding. No interruptions. No neurologic changes.   Goal of Therapy:  Heparin level 0.3-0.7 units/ml Monitor platelets by anticoagulation protocol: Yes   Plan:  Continue heparin infusion at current rate of 1050 units/hr Check confirmatory 8 hour HL HL, CBC daily while on heparin drip Monitor for signs of bleeding Follow along for eventual transition to PO or subcutaneous anticoagulation per oncology recommendations  Lenis Noon, PharmD 05/11/2021,3:35 PM

## 2021-05-11 NOTE — Progress Notes (Signed)
PROGRESS NOTE  Katelyn Kennedy OZH:[id number] DOB: 1946/01/27 DOA: 05/08/2021 PCP: [name], MD   LOS: 3 days   Brief narrative:  Katelyn Kennedy is a 76 y.o. female medical history of lung cancer with brain metastasis (adenocarcinoma) history of chemoradiation in the past, craniotomy and resection of brain mass, peripheral vascular disease, diabetes diet-controlled, presented to the hospital sent from the oncology clinic for findings of pulmonary embolism on the CT scan.  Patient has been feeling of fatigue and weak in her lower extremities recently and complained of some intrascapular pain 3 to 4 days back.  In the ED, patient had elevated blood pressure with mild tachycardia at 108.  Hemoglobin was 10.9.  BMP within normal range.  CT scan done today at 10:30 was positive for acute pulmonary [place] with mild right heart strain consistent with possible submassive PE.  Multiple new ill-defined groundglass attenuation nodules in the lungs. .  Patient was hemodynamically stable and did not require any oxygen.  Troponin was not elevated.  Patient had some hemorrhagic metastasis and this was discussed with Dr. Mickeal Skinner neuro oncologist who recommended heparin drip for anticoagulation. Patient was then admitted to the hospital for further evaluation  Assessment/Plan:  Principal Problem:   Acute pulmonary embolism ([place]) Active Problems:   Type 2 diabetes mellitus without complication, without long-term current use of insulin ([place])   Hyperlipidemia associated with type 2 diabetes mellitus ([place])   History of lung cancer   Non-small cell lung cancer metastatic to brain ([place])   Acute pulmonary embolism.   Diagnosed on CT chest scan of the chest.  Neuro oncology and oncology recommended heparin drip due to hemorrhagic metastasis and need for closer monitoring. Troponin was within normal range.  CTA described some right heart strain patient remained hemodynamically stable with normal troponins..  2D  echocardiogram showed LV ejection fraction of 60 to 65% with grade 1 diastolic dysfunction.  So far stable hemodynamically.  No evidence of bleeding.  We will continue heparin over the weekend.  Left lower extremity DVT.  Continue on heparin drip.  History of lung cancer metastatic to the brain.  Being followed by oncology and neuro-oncology as outpatient.  Oncology following while in hospital.  Being closely monitored in the hospital while on heparin drip   Diabetes mellitus type 2 not on medication.  Diet controlled.  We will continue to monitor while in the hospital.  Accu-Cheks and if necessary sliding scale scale insulin.  Latest POC glucose of 159.   History of hyperlipidemia.  Continue with Lipitor.   Generalized weakness, fatigue.  PT evaluation recommend home PT on discharge.  Disposition.  Currently on heparin drip.  Awaiting for hematology decision on anticoagulation.  Likely disposition home tomorrow if okay with oncology  DVT prophylaxis:   Heparin drip.    Code Status: DNR  Family Communication:   Spoke with the patient's daughter-in-law on 05/08/2021.  I spoke with the patient's sister over the phone on 05/11/2021   status is: Inpatient  Remains inpatient appropriate because: IV heparin drip, need for closer monitoring.  Consultants: Oncology  Procedures: None yet  Anti-infectives:  None  Anti-infectives (From admission, onward)    None      Subjective: Today, patient was seen and examined at bedside.  Denies any chest pain, shortness of breath, fever, chills or rigor.  No evidence of bleeding.   Objective: Vitals:   05/11/21 0403 05/11/21 0756  BP: 123/69 107/69  Pulse: (!) 112 (!) 108  Resp: (!) 22 18  Temp: 98 F (36.7 C) 99.3 F (37.4 C)  SpO2: 95% 91%    Intake/Output Summary (Last 24 hours) at 05/11/2021 0930 Last data filed at 05/11/2021 0400 Gross per 24 hour  Intake 275.81 ml  Output 1250 ml  Net -974.19 ml    Filed Weights    05/08/21 1351 05/08/21 1835 05/11/21 0352  Weight: 62.1 kg 60.8 kg 59.9 kg   Body mass index is 27.6 kg/m.   Physical Exam:  General:  Average built, not in obvious distress HENT:   No scleral pallor or icterus noted. Oral mucosa is moist.  Chest:  Clear breath sounds.  Diminished breath sounds bilaterally. No crackles or wheezes.  CVS: S1 &S2 heard. No murmur.  Regular rate and rhythm. Abdomen: Soft, nontender, nondistended.  Bowel sounds are heard.   Extremities: No cyanosis, clubbing or edema.  Peripheral pulses are palpable. Psych: Alert, awake and oriented, normal mood CNS:  No cranial nerve deficits.  Power equal in all extremities.   Skin: Warm and dry.  No rashes noted.  Data Review: I have personally reviewed the following laboratory data and studies,  CBC: Recent Labs  Lab 05/08/21 1427 05/08/21 1524 05/09/21 0005 05/10/21 0407 05/11/21 0356  WBC 8.2  --  8.2 7.6 8.1  HGB 10.9* 10.9* 10.3* 10.1* 11.1*  HCT 32.8* 32.0* 32.0* 31.5* 35.0*  MCV 91.9  --  94.4 91.8 92.6  PLT 247  --  249 275 270    Basic Metabolic Panel: Recent Labs  Lab 05/08/21 1427 05/08/21 1524 05/09/21 0005  NA 136 136 136  K 4.0 4.0 4.0  CL 103 102 104  CO2 26  --  24  GLUCOSE 107* 106* 172*  BUN 10 8 13   CREATININE 0.58 0.50 0.56  CALCIUM 9.4  --  8.8*  MG  --   --  2.0  PHOS  --   --  3.7    Liver Function Tests: Recent Labs  Lab 05/09/21 0005  AST 27  ALT 21  ALKPHOS 110  BILITOT 0.5  PROT 6.3*  ALBUMIN 2.9*    No results for input(s): LIPASE, AMYLASE in the last 168 hours. No results for input(s): AMMONIA in the last 168 hours. Cardiac Enzymes: No results for input(s): CKTOTAL, CKMB, CKMBINDEX, TROPONINI in the last 168 hours. BNP (last 3 results) No results for input(s): BNP in the last 8760 hours.  ProBNP (last 3 results) No results for input(s): PROBNP in the last 8760 hours.  CBG: Recent Labs  Lab 05/09/21 0745 05/09/21 1229  GLUCAP 159* 130*     Recent Results (from the past 240 hour(s))  Resp Panel by RT-PCR (Flu A&B, Covid) Nasopharyngeal Swab     Status: None   Collection Time: 05/08/21  4:51 PM   Specimen: Nasopharyngeal Swab; Nasopharyngeal(NP) swabs in vial transport medium  Result Value Ref Range Status   SARS Coronavirus 2 by RT PCR NEGATIVE NEGATIVE Final    Comment: (NOTE) SARS-CoV-2 target nucleic acids are NOT DETECTED.  The SARS-CoV-2 RNA is generally detectable in upper respiratory specimens during the acute phase of infection. The lowest concentration of SARS-CoV-2 viral copies this assay can detect is 138 copies/mL. A negative result does not preclude SARS-Cov-2 infection and should not be used as the sole basis for treatment or other patient management decisions. A negative result may occur with  improper specimen collection/handling, submission of specimen other than nasopharyngeal swab, presence of viral mutation(s) within the areas targeted  by this assay, and inadequate number of viral copies(<138 copies/mL). A negative result must be combined with clinical observations, patient history, and epidemiological information. The expected result is Negative.  Fact Sheet for Patients:  EntrepreneurPulse.com.au  Fact Sheet for Healthcare Providers:  IncredibleEmployment.be  This test is no t yet approved or cleared by the [place] FDA and  has been authorized for detection and/or diagnosis of SARS-CoV-2 by FDA under an Emergency Use Authorization (EUA). This EUA will remain  in effect (meaning this test can be used) for the duration of the COVID-19 declaration under Section 564(b)(1) of the Act, 21 U.S.C.section 360bbb-3(b)(1), unless the authorization is terminated  or revoked sooner.       Influenza A by PCR NEGATIVE NEGATIVE Final   Influenza B by PCR NEGATIVE NEGATIVE Final    Comment: (NOTE) The Xpert Xpress SARS-CoV-2/FLU/RSV plus assay is intended as an  aid in the diagnosis of influenza from Nasopharyngeal swab specimens and should not be used as a sole basis for treatment. Nasal washings and aspirates are unacceptable for Xpert Xpress SARS-CoV-2/FLU/RSV testing.  Fact Sheet for Patients: EntrepreneurPulse.com.au  Fact Sheet for Healthcare Providers: IncredibleEmployment.be  This test is not yet approved or cleared by the [place] FDA and has been authorized for detection and/or diagnosis of SARS-CoV-2 by FDA under an Emergency Use Authorization (EUA). This EUA will remain in effect (meaning this test can be used) for the duration of the COVID-19 declaration under Section 564(b)(1) of the Act, 21 U.S.C. section 360bbb-3(b)(1), unless the authorization is terminated or revoked.  Performed at [facility], [place] [address] [place], [place] [address]       Studies: ECHOCARDIOGRAM COMPLETE  Result Date: 05/09/2021    ECHOCARDIOGRAM REPORT   Patient Name:   Katelyn Kennedy Date of Exam: 05/09/2021 Medical Rec #:  [id number]       Height:       58.0 in Accession #:    [id number]      Weight:       134.0 lb Date of Birth:  Dec 04, 1945      BSA:          1.536 m Patient Age:    31 years        BP:           125/62 mmHg Patient Gender: F               HR:           110 bpm. Exam Location:  Inpatient Procedure: 2D Echo Indications:    Pulmonary embolism  History:        Patient has no prior history of Echocardiogram examinations.                 Signs/Symptoms:Shortness of Breath; Risk Factors:Diabetes and                 Hypertension.  Sonographer:    Arlyss Gandy Referring Phys: [id number] [place]  1. Left ventricular ejection fraction, by estimation, is 60 to 65%. The left ventricle has normal function. The left ventricle has no regional wall motion abnormalities. Left ventricular diastolic parameters are consistent with Grade I diastolic dysfunction (impaired  relaxation).  2. Right ventricular systolic function is normal. The right ventricular size is normal. Tricuspid regurgitation signal is inadequate for assessing PA pressure.  3. The mitral valve is normal in structure. No evidence of mitral valve regurgitation. No evidence of mitral stenosis.  4. The  aortic valve is normal in structure. Aortic valve regurgitation is not visualized. No aortic stenosis is present.  5. The inferior vena cava is normal in size with greater than 50% respiratory variability, suggesting right atrial pressure of 3 mmHg. FINDINGS  Left Ventricle: Left ventricular ejection fraction, by estimation, is 60 to 65%. The left ventricle has normal function. The left ventricle has no regional wall motion abnormalities. The left ventricular internal cavity size was normal in size. There is  no left ventricular hypertrophy. Left ventricular diastolic parameters are consistent with Grade I diastolic dysfunction (impaired relaxation). Normal left ventricular filling pressure. Right Ventricle: The right ventricular size is normal. No increase in right ventricular wall thickness. Right ventricular systolic function is normal. Tricuspid regurgitation signal is inadequate for assessing PA pressure. Left Atrium: Left atrial size was normal in size. Right Atrium: Right atrial size was normal in size. Pericardium: There is no evidence of pericardial effusion. Mitral Valve: The mitral valve is normal in structure. No evidence of mitral valve regurgitation. No evidence of mitral valve stenosis. Tricuspid Valve: The tricuspid valve is normal in structure. Tricuspid valve regurgitation is not demonstrated. No evidence of tricuspid stenosis. Aortic Valve: The aortic valve is normal in structure. Aortic valve regurgitation is not visualized. No aortic stenosis is present. Aortic valve mean gradient measures 3.0 mmHg. Aortic valve peak gradient measures 5.5 mmHg. Aortic valve area, by VTI measures 3.02 cm. Pulmonic  Valve: The pulmonic valve was normal in structure. Pulmonic valve regurgitation is not visualized. No evidence of pulmonic stenosis. Aorta: The aortic root is normal in size and structure. Venous: The inferior vena cava is normal in size with greater than 50% respiratory variability, suggesting right atrial pressure of 3 mmHg. IAS/Shunts: No atrial level shunt detected by color flow Doppler.  LEFT VENTRICLE PLAX 2D LVIDd:         3.60 cm   Diastology LVIDs:         2.40 cm   LV e' medial:    6.64 cm/s LV PW:         0.80 cm   LV E/e' medial:  8.6 LV IVS:        0.80 cm   LV e' lateral:   5.77 cm/s LVOT diam:     2.00 cm   LV E/e' lateral: 9.9 LV SV:         57 LV SV Index:   37 LVOT Area:     3.14 cm  RIGHT VENTRICLE             IVC RV Basal diam:  2.40 cm     IVC diam: 1.80 cm RV Mid diam:    2.00 cm RV S prime:     15.60 cm/s TAPSE (M-mode): 1.5 cm LEFT ATRIUM             Index        RIGHT ATRIUM          Index LA diam:        3.10 cm 2.02 cm/m   RA Area:     6.78 cm LA Vol (A2C):   23.5 ml 15.30 ml/m  RA Volume:   9.44 ml  6.15 ml/m LA Vol (A4C):   22.6 ml 14.71 ml/m LA Biplane Vol: 23.4 ml 15.23 ml/m  AORTIC VALVE AV Area (Vmax):    2.82 cm AV Area (Vmean):   2.79 cm AV Area (VTI):     3.02 cm AV Vmax:  117.00 cm/s AV Vmean:          80.200 cm/s AV VTI:            0.188 m AV Peak Grad:      5.5 mmHg AV Mean Grad:      3.0 mmHg LVOT Vmax:         105.00 cm/s LVOT Vmean:        71.100 cm/s LVOT VTI:          0.181 m LVOT/AV VTI ratio: 0.96  AORTA Ao Root diam: 2.60 cm Ao Asc diam:  2.30 cm MITRAL VALVE MV Area (PHT): 5.23 cm    SHUNTS MV Decel Time: 145 msec    Systemic VTI:  0.18 m MV E velocity: 57.40 cm/s  Systemic Diam: 2.00 cm MV A velocity: 89.50 cm/s MV E/A ratio:  0.64 Fransico Him MD Electronically signed by Fransico Him MD Signature Date/Time: 05/09/2021/12:41:01 PM    Final    VAS [name] LOWER EXTREMITY VENOUS (DVT)  Result Date: 05/11/2021  Lower Venous DVT Study Patient Name:   Katelyn Kennedy  Date of Exam:   05/09/2021 Medical Rec #: [id number]        Accession #:    [id number] Date of Birth: 1945/12/20       Patient Gender: F Patient Age:   32 years Exam Location:  [facility] Procedure:      VAS [name] LOWER EXTREMITY VENOUS (DVT) Referring Phys: Burney Gauze --------------------------------------------------------------------------------  Indications: Pulmonary embolism.  Risk Factors: Confirmed PE. Anticoagulation: Heparin. Limitations: Patient positioning. Comparison Study: No prior studies. Performing Technologist: Oliver Hum RVT  Examination Guidelines: A complete evaluation includes B-mode imaging, spectral Doppler, color Doppler, and power Doppler as needed of all accessible portions of each vessel. Bilateral testing is considered an integral part of a complete examination. Limited examinations for reoccurring indications may be performed as noted. The reflux portion of the exam is performed with the patient in reverse Trendelenburg.  +---------+---------------+---------+-----------+----------+--------------+  RIGHT     Compressibility Phasicity Spontaneity Properties Thrombus Aging  +---------+---------------+---------+-----------+----------+--------------+  CFV       Full            Yes       Yes                                    +---------+---------------+---------+-----------+----------+--------------+  SFJ       Full                                                             +---------+---------------+---------+-----------+----------+--------------+  FV Prox   Full                                                             +---------+---------------+---------+-----------+----------+--------------+  FV Mid    Full                                                             +---------+---------------+---------+-----------+----------+--------------+  FV Distal Full                                                              +---------+---------------+---------+-----------+----------+--------------+  PFV       Full                                                             +---------+---------------+---------+-----------+----------+--------------+  POP       Full            Yes       Yes                                    +---------+---------------+---------+-----------+----------+--------------+  PTV       Full                                                             +---------+---------------+---------+-----------+----------+--------------+  PERO      Full                                                             +---------+---------------+---------+-----------+----------+--------------+   +---------+---------------+---------+-----------+----------+--------------+  LEFT      Compressibility Phasicity Spontaneity Properties Thrombus Aging  +---------+---------------+---------+-----------+----------+--------------+  CFV       Full            Yes       Yes                                    +---------+---------------+---------+-----------+----------+--------------+  SFJ       Full                                                             +---------+---------------+---------+-----------+----------+--------------+  FV Prox   Full                                                             +---------+---------------+---------+-----------+----------+--------------+  FV Mid    Full                                                             +---------+---------------+---------+-----------+----------+--------------+  FV Distal Full                                                             +---------+---------------+---------+-----------+----------+--------------+  PFV       Full                                                             +---------+---------------+---------+-----------+----------+--------------+  POP       Partial         Yes       Yes                    Acute            +---------+---------------+---------+-----------+----------+--------------+  PTV       Full                                                             +---------+---------------+---------+-----------+----------+--------------+  PERO      None                                             Acute           +---------+---------------+---------+-----------+----------+--------------+  Gastroc   Full                                                             +---------+---------------+---------+-----------+----------+--------------+     Summary: RIGHT: - There is no evidence of deep vein thrombosis in the lower extremity.  - No cystic structure found in the popliteal fossa.  LEFT: - Findings consistent with acute deep vein thrombosis involving the left popliteal vein, and left peroneal veins. - No cystic structure found in the popliteal fossa.  *See table(s) above for measurements and observations. Electronically signed by Servando Snare MD on 05/11/2021 at 9:29:17 AM.    Final       Flora Lipps, MD  [facility] 05/11/2021  If 7PM-7AM, please contact night-coverage

## 2021-05-11 NOTE — Progress Notes (Signed)
ANTICOAGULATION CONSULT NOTE - Follow Up  Pharmacy Consult for IV heparin Indication: pulmonary embolus  Allergies  Allergen Reactions   Codeine Nausea Only    Patient Measurements: Height: 4\' 10"  (147.3 cm) Weight: 59.9 kg (132 lb 0.9 oz) IBW/kg (Calculated) : 40.9 Heparin Dosing Weight: 54 kg  Vital Signs: Temp: 98 F (36.7 C) (01/29 0403) Temp Source: Oral (01/29 0403) BP: 123/69 (01/29 0403) Pulse Rate: 112 (01/29 0403)  Labs: Recent Labs    05/08/21 1427 05/08/21 1524 05/09/21 0005 05/09/21 0841 05/09/21 1630 05/10/21 0407 05/11/21 0356  HGB 10.9* 10.9* 10.3*  --   --  10.1* 11.1*  HCT 32.8* 32.0* 32.0*  --   --  31.5* 35.0*  PLT 247  --  249  --   --  275 321  HEPARINUNFRC  --   --  <0.10*   < > 0.49 0.36 0.24*  CREATININE 0.58 0.50 0.56  --   --   --   --   TROPONINIHS 4  --   --   --   --   --   --    < > = values in this interval not displayed.     Estimated Creatinine Clearance: 45.1 mL/min (by C-G formula based on SCr of 0.56 mg/dL).   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 Presented from cancer center after chest CT revealed she has a acute PE, submassive with right heart strain.  Pharmacy consulted to dose heparin. Per H&P - MD discussed anticoagulation with Dr Mickeal Skinner as pt has some hemorrhagic metastasis - recommended heparin drip on admission.  Confirmed with Dr. Mickeal Skinner on 1/27 that it is ok to bolus heparin if needed.  Today, 05/11/21 HL = 0.24 sub-therapeutic on heparin infusion of 900 units/hr CBC: Hgb slightly low but stable; Plt WNL Per RN no interruptions and no bleeding  Goal of Therapy:  Heparin level 0.3-0.7 units/ml Monitor platelets by anticoagulation protocol: Yes   Plan:  Increase heparin drip to 1050 units/hr Heparin level in 8 hours HL, CBC daily while on heparin drip Monitor for signs of bleeding Follow along for eventual transition to PO or subcutaneous anticoagulation  [place] 05/11/2021, 5:07 AM

## 2021-05-11 NOTE — Progress Notes (Signed)
ANTICOAGULATION CONSULT NOTE - Follow Up  Pharmacy Consult for IV heparin Indication: pulmonary embolus  Allergies  Allergen Reactions   Codeine Nausea Only    Patient Measurements: Height: 4\' 10"  (147.3 cm) Weight: 59.9 kg (132 lb 0.9 oz) IBW/kg (Calculated) : 40.9 Heparin Dosing Weight: 54 kg  Vital Signs: Temp: 99.5 F (37.5 C) (01/29 2031) Temp Source: Oral (01/29 2031) BP: 117/69 (01/29 2031) Pulse Rate: 115 (01/29 2031)  Labs: Recent Labs    05/09/21 0005 05/09/21 0841 05/10/21 0407 05/11/21 0356 05/11/21 1321 05/11/21 2115  HGB 10.3*  --  10.1* 11.1*  --   --   HCT 32.0*  --  31.5* 35.0*  --   --   PLT 249  --  275 321  --   --   HEPARINUNFRC <0.10*   < > 0.36 0.24* 0.35 0.36  CREATININE 0.56  --   --   --   --   --    < > = values in this interval not displayed.     Estimated Creatinine Clearance: 45.1 mL/min (by C-G formula based on SCr of 0.56 mg/dL).   Medical History: Past Medical History:  Diagnosis Date   Allergic rhinitis    Arthritis    Diabetes mellitus without complication ([place])    diet control/no meds per pt   Dyspnea    Essential hypertension 04/11/2019   History of chicken pox    History of hiatal hernia    History of radiation therapy    right lung - 06/28/2017-08/11/2017  Dr Kyung Rudd   History of radiation therapy    SRS brain - 11/29/2020-12/04/2020  Dr Jenny Reichmann [place]/Matthew Tammi Klippel   History of radiation therapy    Whole brain - 03/10/2021-03/19/2021 Dr Gery Pray   lung ca dx'd 04/2017   Lung cancer ([place])    Peripheral vascular disease ([place])    Pneumonia    PONV (postoperative nausea and vomiting)    Smoker     Medications: Pt not on anticoagulants PTA  Assessment: Pt is a 64 yoF with 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 Presented from cancer center after chest CT revealed she has a acute PE, submassive with right heart strain. Pharmacy consulted to dose heparin. Per H&P - MD discussed anticoagulation with Dr Mickeal Skinner as  pt has some hemorrhagic metastasis - recommended heparin drip on admission.  Confirmed with Dr. Mickeal Skinner on 1/27 that it is ok to bolus heparin if needed.  Today, 05/11/21 2115 confirmatory heparin level 0.36, therapeutic on 1050 units/hr No bleeding or infusion issues noted  Goal of Therapy:  Heparin level 0.3-0.7 units/ml Monitor platelets by anticoagulation protocol: Yes   Plan:  Continue heparin infusion at current rate of 1050 units/hr HL, CBC daily while on heparin drip Monitor for signs of bleeding Follow along for eventual transition to PO or subcutaneous anticoagulation per oncology recommendations  Dolly Rias RPh 05/11/2021, 10:54 PM

## 2021-05-12 ENCOUNTER — Encounter (HOSPITAL_COMMUNITY): Payer: Self-pay | Admitting: Internal Medicine

## 2021-05-12 ENCOUNTER — Inpatient Hospital Stay (HOSPITAL_COMMUNITY): Payer: PPO

## 2021-05-12 LAB — CBC
HCT: 33.2 % — ABNORMAL LOW (ref 36.0–46.0)
Hemoglobin: 10.5 g/dL — ABNORMAL LOW (ref 12.0–15.0)
MCH: 29.5 pg (ref 26.0–34.0)
MCHC: 31.6 g/dL (ref 30.0–36.0)
MCV: 93.3 fL (ref 80.0–100.0)
Platelets: 314 10*3/uL (ref 150–400)
RBC: 3.56 MIL/uL — ABNORMAL LOW (ref 3.87–5.11)
RDW: 15.5 % (ref 11.5–15.5)
WBC: 7.8 10*3/uL (ref 4.0–10.5)
nRBC: 0 % (ref 0.0–0.2)

## 2021-05-12 LAB — GLUCOSE, CAPILLARY
Glucose-Capillary: 133 mg/dL — ABNORMAL HIGH (ref 70–99)
Glucose-Capillary: 112 mg/dL — ABNORMAL HIGH (ref 70–99)
Glucose-Capillary: 139 mg/dL — ABNORMAL HIGH (ref 70–99)
Glucose-Capillary: 228 mg/dL — ABNORMAL HIGH (ref 70–99)

## 2021-05-12 LAB — HEPARIN LEVEL (UNFRACTIONATED): Heparin Unfractionated: 0.33 IU/mL (ref 0.30–0.70)

## 2021-05-12 MED ORDER — APIXABAN 5 MG PO TABS
5.0000 mg | ORAL_TABLET | Freq: Two times a day (BID) | ORAL | Status: DC
  Administered 2021-05-12: 5 mg via ORAL
  Filled 2021-05-12: qty 1

## 2021-05-12 MED ORDER — APIXABAN 5 MG PO TABS: 5.0000 mg | ORAL_TABLET | Freq: Two times a day (BID) | ORAL | 2 refills | Status: AC

## 2021-05-12 NOTE — Progress Notes (Addendum)
Mobility Specialist - Progress Note    05/12/21 1155  Mobility  Activity Transferred to/from [facility];Ambulated with assistance in room  Level of Assistance Minimal assist, patient does 75% or more  Assistive Device Front wheel walker  Distance Ambulated (ft) 5 ft  Activity Response Tolerated well  $Mobility charge 1 Mobility   Upon entry pt was agreeable to mobilize and required extra time to sit EOB. Once up, pt needed Min A and used RW to stand from bed, but stated feeling very weak and returned to sit EOB. Pt completed 10 leg extensions and leg lifts on each side prior to standing again. Pt stood up for a second time and had L knee buckle, pt stated she had a harder time lifting her L foot today. Pt transferred to [facility] after standing. Once done, pt required Min A to stand from [facility] and experienced LOB. Regained stability shortly after and ambulated ~5 ft in room to return to bed. Was left in bed with call bell at side and awaiting NT for bath. RN informed of session.  [place] Specialist Acute Rehabilitation Services Phone: [phone] 05/12/21, 11:59 AM

## 2021-05-12 NOTE — Discharge Summary (Signed)
Physician Discharge Summary  NIKOLE SWARTZENTRUBER ACZ:[id number] DOB: 1945/05/04 DOA: 05/08/2021  PCP: [name], MD  Admit date: 05/08/2021 Discharge date: 05/12/2021  Admitted From: Home  Discharge disposition: Home with home health  Recommendations for Outpatient Follow-Up:   Follow up with your primary care provider in one week.  Check CBC, BMP, magnesium in the next visit Follow-up with oncology Dr. Myles Gip as scheduled by the clinic. Patient to follow-up with palliative care as outpatient.  Discharge Diagnosis:   Principal Problem:   Acute pulmonary embolism (HCC) Active Problems:   Type 2 diabetes mellitus without complication, without long-term current use of insulin (HCC)   Hyperlipidemia associated with type 2 diabetes mellitus ([place])   History of lung cancer   Non-small cell lung cancer metastatic to brain [facility])   Discharge Condition: Improved.  Diet recommendation:   Carbohydrate-modified.    Wound care: None.  Code status: DNR  History of Present Illness:   Katelyn Kennedy is a 76 y.o. female medical history of lung cancer with brain metastasis (adenocarcinoma) history of chemoradiation in the past, craniotomy and resection of brain mass, peripheral vascular disease, diabetes diet-controlled, presented to the hospital sent from the oncology clinic for findings of pulmonary embolism on the CT scan.  Patient has been feeling of fatigue and weak in her lower extremities recently and complained of some intrascapular pain 3 to 4 days back.  In the ED, patient had elevated blood pressure with mild tachycardia at 108.  Hemoglobin was 10.9.  BMP within normal range.  CT scan done showed was positive for acute pulmonary [place] with mild right heart strain consistent with possible submassive PE.  Multiple new ill-defined groundglass attenuation nodules in the lungs. .  Patient was hemodynamically stable and did not require any oxygen.  Troponin was not elevated.  Patient  had some hemorrhagic metastasis and this was discussed with Dr. Mickeal Skinner neuro oncologist who recommended heparin drip for anticoagulation. Patient was then admitted to the hospital for further evaluation   Hospital Course:   Following conditions were addressed during hospitalization as listed below,  Acute pulmonary embolism.   Diagnosed on CT chest scan of the chest.  Neuro oncology and oncology recommended heparin drip due to hemorrhagic metastasis and need for closer monitoring. Troponin was within normal range.  CTA described some right heart strain patient remained hemodynamically stable with normal troponins..  2D echocardiogram showed LV ejection fraction of 60 to 65% with grade 1 diastolic dysfunction.  Patient remained hemodynamically stable during hospitalization without any evidence of bleeding hemoglobin remained stable including clinical findings.  I spoke with Dr. Marin Olp who recommended Eliquis 5 mg twice daily on discharge with outpatient oncology follow-up.     Left lower extremity DVT.  Continue on Eliquis on discharge.   History of lung cancer metastatic to the brain.  Being followed by oncology and neuro-oncology as outpatient.  Oncology alert the patient during hospitalization on heparin drip.  At this time this has been transitioned to Eliquis    Diabetes mellitus type 2 not on medication.  Diet controlled.     History of hyperlipidemia.  Continue with Lipitor.   Generalized weakness, fatigue.  PT evaluation recommend home PT on discharge.  Arranged on home PT on discharge.  Disposition.  At this time, patient is stable for disposition home with outpatient PCP and oncology follow-up.  Spoke with the patient's sister on the phone on 05/11/2021.  Medical Consultants:   Oncology  Procedures:  None Subjective:   Today, patient was seen and examined at bedside.  Patient denies any chest pain, dizziness, lightheadedness or shortness of breath.  Discharge Exam:    Vitals:   05/12/21 0233 05/12/21 0612  BP: 120/73 123/73  Pulse: (!) 112 (!) 107  Resp: (!) 22 20  Temp: 99.3 F (37.4 C) 99.1 F (37.3 C)  SpO2: 93% 94%   Vitals:   05/11/21 2317 05/12/21 0233 05/12/21 0500 05/12/21 0612  BP: 118/63 120/73  123/73  Pulse: (!) 114 (!) 112  (!) 107  Resp: 16 (!) 22  20  Temp: 98.3 F (36.8 C) 99.3 F (37.4 C)  99.1 F (37.3 C)  TempSrc: Oral Oral  Oral  SpO2: 92% 93%  94%  Weight:   64.9 kg   Height:        General: Alert awake, not in obvious distress HENT: pupils equally reacting to light,  No scleral pallor or icterus noted. Oral mucosa is moist.  Chest:   Diminished breath sounds bilaterally. No crackles or wheezes.  CVS: S1 &S2 heard. No murmur.  Regular rate and rhythm. Abdomen: Soft, nontender, nondistended.  Bowel sounds are heard.   Extremities: No cyanosis, clubbing or edema.  Peripheral pulses are palpable. Psych: Alert, awake and oriented, normal mood CNS:  No cranial nerve deficits.  Power equal in all extremities.   Skin: Warm and dry.  No rashes noted.  The results of significant diagnostics from this hospitalization (including imaging, microbiology, ancillary and laboratory) are listed below for reference.     Diagnostic Studies:   CT Chest W Contrast  Result Date: 05/08/2021 CLINICAL DATA:  76 year old female with history of small cell lung cancer. Staging examination. EXAM: CT CHEST WITH CONTRAST TECHNIQUE: Multidetector CT imaging of the chest was performed during intravenous contrast administration. RADIATION DOSE REDUCTION: This exam was performed according to the departmental dose-optimization program which includes automated exposure control, adjustment of the mA and/or kV according to patient size and/or use of iterative reconstruction technique. CONTRAST:  68mL OMNIPAQUE IOHEXOL 300 MG/ML  SOLN COMPARISON:  Chest CT 11/11/2020.  PET-CT 12/24/2020. FINDINGS: Cardiovascular: Filling defects are noted throughout the  pulmonary arteries bilaterally, including a saddle embolus, as well as filling defects in the main pulmonary arteries bilaterally as well as segmental and subsegmental sized branches. The majority of these appear nonocclusive, although there may be some occlusive embolus in the right lower lobe. Heart size is normal. Right ventricular diameter estimated at 39 mm. Left ventricular diameter estimated at 33 mm. RV to LV ratio of 1.18. There is no significant pericardial fluid, thickening or pericardial calcification. There is aortic atherosclerosis, as well as atherosclerosis of the great vessels of the mediastinum and the coronary arteries, including calcified atherosclerotic plaque in the left main, left anterior descending, left circumflex and right coronary arteries. Mediastinum/Nodes: No pathologically enlarged mediastinal or hilar lymph nodes. Small hiatal hernia. No axillary lymphadenopathy. Lungs/Pleura: There is again extensive mass-like architectural distortion throughout the right mid to lower lung, compatible with chronic postradiation mass-like fibrosis where there is associated traction bronchiectasis, very similar to prior studies. No acute consolidative airspace disease. No pleural effusions. Patchy areas of mild ground-glass attenuation and septal thickening are now noted throughout the lungs bilaterally. Several small pulmonary nodules are again noted, largest of which is in the right lower lobe measuring 7 x 6 mm (unchanged). Other smaller pulmonary nodules are also evident, some of which are new compared to the prior study. Examples of this include a  4 mm solid nodule in the left lower lobe (axial image 89 of series 4), and several ill-defined ground-glass attenuation nodules such as a 5 x 8 mm ground-glass attenuation nodule in the left lower lobe (axial image 99 of series 4). 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 Upper Abdomen: Aortic atherosclerosis. Diffuse low attenuation throughout the visualized hepatic parenchyma, indicative of a background of hepatic steatosis. Musculoskeletal: Old healed fractures of the lateral aspect of the seventh and eighth ribs bilaterally are incidentally noted. There are no aggressive appearing lytic or blastic lesions noted in the visualized portions of the skeleton. IMPRESSION: 1. Positive for acute PE with CT evidence of right heart strain (RV/LV Ratio = 1.18) consistent with at least submassive (intermediate risk) PE. The presence of right heart strain has been associated with an increased risk of morbidity and mortality. Please refer to the "PE Focused" order set in EPIC. 2. Multiple new ill-defined ground-glass attenuation nodules, as well as several small solid-appearing nodules, in addition to widespread ill-defined areas of ground-glass attenuation and septal thickening in the lungs bilaterally. This is nonspecific, and could be seen in the setting of acute infection or drug reaction. Close attention on follow-up studies is recommended to ensure stability or resolution of these findings. 3. 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 4. Aortic atherosclerosis, in addition to left main and three-vessel coronary artery disease. Assessment for potential risk factor modification, dietary therapy or pharmacologic therapy may be warranted, if clinically indicated. 5. Hepatic steatosis. Critical Value/emergent results were called by telephone at the time of interpretation on 05/08/2021 at 12:21 pm to provider [facility] , who verbally acknowledged these results. Aortic Atherosclerosis (ICD10-I70.0). Electronically Signed   By: Vinnie Langton M.D.   On: 05/08/2021 12:33   ECHOCARDIOGRAM COMPLETE  Result Date: 05/09/2021    ECHOCARDIOGRAM REPORT   Patient Name:   FLETA BORGESON Date of Exam:  05/09/2021 Medical Rec #:  [id number]       Height:       58.0 in Accession #:    [id number]      Weight:       134.0 lb Date of Birth:  Aug 30, 1945      BSA:          1.536 m Patient Age:    76 years        BP:           125/62 mmHg Patient Gender: F               HR:           110 bpm. Exam Location:  Inpatient Procedure: 2D Echo Indications:    Pulmonary embolism  History:        Patient has no prior history of Echocardiogram examinations.                 Signs/Symptoms:Shortness of Breath; Risk Factors:Diabetes and                 Hypertension.  Sonographer:    Arlyss Gandy Referring Phys: [id number] [place]  1. Left ventricular ejection fraction, by estimation, is 60 to 65%. The left ventricle has normal function. The left ventricle has no regional wall motion abnormalities. Left ventricular diastolic parameters are consistent with Grade I diastolic dysfunction (impaired relaxation).  2. Right ventricular systolic  function is normal. The right ventricular size is normal. Tricuspid regurgitation signal is inadequate for assessing PA pressure.  3. The mitral valve is normal in structure. No evidence of mitral valve regurgitation. No evidence of mitral stenosis.  4. The aortic valve is normal in structure. Aortic valve regurgitation is not visualized. No aortic stenosis is present.  5. The inferior vena cava is normal in size with greater than 50% respiratory variability, suggesting right atrial pressure of 3 mmHg. FINDINGS  Left Ventricle: Left ventricular ejection fraction, by estimation, is 60 to 65%. The left ventricle has normal function. The left ventricle has no regional wall motion abnormalities. The left ventricular internal cavity size was normal in size. There is  no left ventricular hypertrophy. Left ventricular diastolic parameters are consistent with Grade I diastolic dysfunction (impaired relaxation). Normal left ventricular filling pressure. Right Ventricle: The right ventricular  size is normal. No increase in right ventricular wall thickness. Right ventricular systolic function is normal. Tricuspid regurgitation signal is inadequate for assessing PA pressure. Left Atrium: Left atrial size was normal in size. Right Atrium: Right atrial size was normal in size. Pericardium: There is no evidence of pericardial effusion. Mitral Valve: The mitral valve is normal in structure. No evidence of mitral valve regurgitation. No evidence of mitral valve stenosis. Tricuspid Valve: The tricuspid valve is normal in structure. Tricuspid valve regurgitation is not demonstrated. No evidence of tricuspid stenosis. Aortic Valve: The aortic valve is normal in structure. Aortic valve regurgitation is not visualized. No aortic stenosis is present. Aortic valve mean gradient measures 3.0 mmHg. Aortic valve peak gradient measures 5.5 mmHg. Aortic valve area, by VTI measures 3.02 cm. Pulmonic Valve: The pulmonic valve was normal in structure. Pulmonic valve regurgitation is not visualized. No evidence of pulmonic stenosis. Aorta: The aortic root is normal in size and structure. Venous: The inferior vena cava is normal in size with greater than 50% respiratory variability, suggesting right atrial pressure of 3 mmHg. IAS/Shunts: No atrial level shunt detected by color flow Doppler.  LEFT VENTRICLE PLAX 2D LVIDd:         3.60 cm   Diastology LVIDs:         2.40 cm   LV e' medial:    6.64 cm/s LV PW:         0.80 cm   LV E/e' medial:  8.6 LV IVS:        0.80 cm   LV e' lateral:   5.77 cm/s LVOT diam:     2.00 cm   LV E/e' lateral: 9.9 LV SV:         57 LV SV Index:   37 LVOT Area:     3.14 cm  RIGHT VENTRICLE             IVC RV Basal diam:  2.40 cm     IVC diam: 1.80 cm RV Mid diam:    2.00 cm RV S prime:     15.60 cm/s TAPSE (M-mode): 1.5 cm LEFT ATRIUM             Index        RIGHT ATRIUM          Index LA diam:        3.10 cm 2.02 cm/m   RA Area:     6.78 cm LA Vol (A2C):   23.5 ml 15.30 ml/m  RA Volume:   9.44  ml  6.15 ml/m LA Vol (A4C):   22.6 ml  14.71 ml/m LA Biplane Vol: 23.4 ml 15.23 ml/m  AORTIC VALVE AV Area (Vmax):    2.82 cm AV Area (Vmean):   2.79 cm AV Area (VTI):     3.02 cm AV Vmax:           117.00 cm/s AV Vmean:          80.200 cm/s AV VTI:            0.188 m AV Peak Grad:      5.5 mmHg AV Mean Grad:      3.0 mmHg LVOT Vmax:         105.00 cm/s LVOT Vmean:        71.100 cm/s LVOT VTI:          0.181 m LVOT/AV VTI ratio: 0.96  AORTA Ao Root diam: 2.60 cm Ao Asc diam:  2.30 cm MITRAL VALVE MV Area (PHT): 5.23 cm    SHUNTS MV Decel Time: 145 msec    Systemic VTI:  0.18 m MV E velocity: 57.40 cm/s  Systemic Diam: 2.00 cm MV A velocity: 89.50 cm/s MV E/A ratio:  0.64 Fransico Him MD Electronically signed by Fransico Him MD Signature Date/Time: 05/09/2021/12:41:01 PM    Final    VAS [name] LOWER EXTREMITY VENOUS (DVT)  Result Date: 05/11/2021  Lower Venous DVT Study Patient Name:  WINSTON SOBCZYK  Date of Exam:   05/09/2021 Medical Rec #: [id number]        Accession #:    [id number] Date of Birth: [birth year]       Patient Gender: F Patient Age:   73 years Exam Location:  [facility] Procedure:      VAS [name] LOWER EXTREMITY VENOUS (DVT) Referring Phys: Burney Gauze --------------------------------------------------------------------------------  Indications: Pulmonary embolism.  Risk Factors: Confirmed PE. Anticoagulation: Heparin. Limitations: Patient positioning. Comparison Study: No prior studies. Performing Technologist: Oliver Hum RVT  Examination Guidelines: A complete evaluation includes B-mode imaging, spectral Doppler, color Doppler, and power Doppler as needed of all accessible portions of each vessel. Bilateral testing is considered an integral part of a complete examination. Limited examinations for reoccurring indications may be performed as noted. The reflux portion of the exam is performed with the patient in reverse Trendelenburg.   +---------+---------------+---------+-----------+----------+--------------+  RIGHT     Compressibility Phasicity Spontaneity Properties Thrombus Aging  +---------+---------------+---------+-----------+----------+--------------+  CFV       Full            Yes       Yes                                    +---------+---------------+---------+-----------+----------+--------------+  SFJ       Full                                                             +---------+---------------+---------+-----------+----------+--------------+  FV Prox   Full                                                             +---------+---------------+---------+-----------+----------+--------------+  FV Mid    Full                                                             +---------+---------------+---------+-----------+----------+--------------+  FV Distal Full                                                             +---------+---------------+---------+-----------+----------+--------------+  PFV       Full                                                             +---------+---------------+---------+-----------+----------+--------------+  POP       Full            Yes       Yes                                    +---------+---------------+---------+-----------+----------+--------------+  PTV       Full                                                             +---------+---------------+---------+-----------+----------+--------------+  PERO      Full                                                             +---------+---------------+---------+-----------+----------+--------------+   +---------+---------------+---------+-----------+----------+--------------+  LEFT      Compressibility Phasicity Spontaneity Properties Thrombus Aging  +---------+---------------+---------+-----------+----------+--------------+  CFV       Full            Yes       Yes                                     +---------+---------------+---------+-----------+----------+--------------+  SFJ       Full                                                             +---------+---------------+---------+-----------+----------+--------------+  FV Prox   Full                                                             +---------+---------------+---------+-----------+----------+--------------+  FV Mid    Full                                                             +---------+---------------+---------+-----------+----------+--------------+  FV Distal Full                                                             +---------+---------------+---------+-----------+----------+--------------+  PFV       Full                                                             +---------+---------------+---------+-----------+----------+--------------+  POP       Partial         Yes       Yes                    Acute           +---------+---------------+---------+-----------+----------+--------------+  PTV       Full                                                             +---------+---------------+---------+-----------+----------+--------------+  PERO      None                                             Acute           +---------+---------------+---------+-----------+----------+--------------+  Gastroc   Full                                                             +---------+---------------+---------+-----------+----------+--------------+     Summary: RIGHT: - There is no evidence of deep vein thrombosis in the lower extremity.  - No cystic structure found in the popliteal fossa.  LEFT: - Findings consistent with acute deep vein thrombosis involving the left popliteal vein, and left peroneal veins. - No cystic structure found in the popliteal fossa.  *See table(s) above for measurements and observations. Electronically signed by Servando Snare MD on 05/11/2021 at 9:29:17 AM.    Final      Labs:   Basic Metabolic Panel: Recent Labs   Lab 05/08/21 1427 05/08/21 1524 05/09/21 0005  NA 136 136 136  K 4.0 4.0 4.0  CL 103 102 104  CO2 26  --  24  GLUCOSE 107* 106* 172*  BUN 10 8 13   CREATININE 0.58 0.50 0.56  CALCIUM 9.4  --  8.8*  MG  --   --  2.0  PHOS  --   --  3.7   GFR Estimated Creatinine Clearance: 46.9 mL/min (by C-G formula based on SCr of 0.56 mg/dL). Liver Function Tests: Recent Labs  Lab 05/09/21 0005  AST 27  ALT 21  ALKPHOS 110  BILITOT 0.5  PROT 6.3*  ALBUMIN 2.9*   No results for input(s): LIPASE, AMYLASE in the last 168 hours. No results for input(s): AMMONIA in the last 168 hours. Coagulation profile No results for input(s): INR, PROTIME in the last 168 hours.  CBC: Recent Labs  Lab 05/08/21 1427 05/08/21 1524 05/09/21 0005 05/10/21 0407 05/11/21 0356 05/12/21 0402  WBC 8.2  --  8.2 7.6 8.1 7.8  HGB 10.9* 10.9* 10.3* 10.1* 11.1* 10.5*  HCT 32.8* 32.0* 32.0* 31.5* 35.0* 33.2*  MCV 91.9  --  94.4 91.8 92.6 93.3  PLT 247  --  249 275 321 314   Cardiac Enzymes: No results for input(s): CKTOTAL, CKMB, CKMBINDEX, TROPONINI in the last 168 hours. BNP: Invalid input(s): POCBNP CBG: Recent Labs  Lab 05/09/21 1229 05/09/21 1623 05/09/21 1822 05/10/21 0748 05/10/21 1126  GLUCAP 130* 133* 112* 139* 228*   D-Dimer No results for input(s): DDIMER in the last 72 hours. Hgb A1c No results for input(s): HGBA1C in the last 72 hours. Lipid Profile No results for input(s): CHOL, HDL, LDLCALC, TRIG, CHOLHDL, LDLDIRECT in the last 72 hours. Thyroid function studies No results for input(s): TSH, T4TOTAL, T3FREE, THYROIDAB in the last 72 hours.  Invalid input(s): FREET3 Anemia work up No results for input(s): VITAMINB12, FOLATE, FERRITIN, TIBC, IRON, RETICCTPCT in the last 72 hours. Microbiology Recent Results (from the past 240 hour(s))  Resp Panel by RT-PCR (Flu A&B, Covid) Nasopharyngeal Swab     Status: None   Collection Time: 05/08/21  4:51 PM   Specimen: Nasopharyngeal  Swab; Nasopharyngeal(NP) swabs in vial transport medium  Result Value Ref Range Status   SARS Coronavirus 2 by RT PCR NEGATIVE NEGATIVE Final    Comment: (NOTE) SARS-CoV-2 target nucleic acids are NOT DETECTED.  The SARS-CoV-2 RNA is generally detectable in upper respiratory specimens during the acute phase of infection. The lowest concentration of SARS-CoV-2 viral copies this assay can detect is 138 copies/mL. A negative result does not preclude SARS-Cov-2 infection and should not be used as the sole basis for treatment or other patient management decisions. A negative result may occur with  improper specimen collection/handling, submission of specimen other than nasopharyngeal swab, presence of viral mutation(s) within the areas targeted by this assay, and inadequate number of viral copies(<138 copies/mL). A negative result must be combined with clinical observations, patient history, and epidemiological information. The expected result is Negative.  Fact Sheet for Patients:  EntrepreneurPulse.com.au  Fact Sheet for Healthcare Providers:  IncredibleEmployment.be  This test is no t yet approved or cleared by the [place] FDA and  has been authorized for detection and/or diagnosis of SARS-CoV-2 by FDA under an Emergency Use Authorization (EUA). This EUA will remain  in effect (meaning this test can be used) for the duration of the COVID-19 declaration under Section 564(b)(1) of the Act, 21 U.S.C.section 360bbb-3(b)(1), unless the authorization is terminated  or revoked sooner.       Influenza A by PCR NEGATIVE NEGATIVE Final   Influenza B by PCR NEGATIVE NEGATIVE Final    Comment: (NOTE) The Xpert Xpress SARS-CoV-2/FLU/RSV plus assay is intended as an aid in the diagnosis of influenza from Nasopharyngeal  swab specimens and should not be used as a sole basis for treatment. Nasal washings and aspirates are unacceptable for Xpert Xpress  SARS-CoV-2/FLU/RSV testing.  Fact Sheet for Patients: EntrepreneurPulse.com.au  Fact Sheet for Healthcare Providers: IncredibleEmployment.be  This test is not yet approved or cleared by the [place] FDA and has been authorized for detection and/or diagnosis of SARS-CoV-2 by FDA under an Emergency Use Authorization (EUA). This EUA will remain in effect (meaning this test can be used) for the duration of the COVID-19 declaration under Section 564(b)(1) of the Act, 21 U.S.C. section 360bbb-3(b)(1), unless the authorization is terminated or revoked.  Performed at [facility], [place] [address], [place], [place] [address]      Discharge Instructions:   Discharge Instructions     Call MD for:  difficulty breathing, headache or visual disturbances   Complete by: As directed    Call MD for:  severe uncontrolled pain   Complete by: As directed    Diet Carb Modified   Complete by: As directed    Discharge instructions   Complete by: As directed    Follow-up with your primary care physician in 1 week.  Follow-up with Dr. Julien Nordmann oncology as scheduled by the clinic.  Continue to take blood thinners as prescribed.  Please take precautions while you are on a blood thinner.  Seek medical attention for worsening symptoms.   Increase activity slowly   Complete by: As directed       Allergies as of 05/12/2021       Reactions   Codeine Nausea Only        Medication List     TAKE these medications    amitriptyline 50 MG tablet Commonly known as: ELAVIL Take 1 tablet (50 mg total) by mouth at bedtime.   apixaban 5 MG Tabs tablet Commonly known as: ELIQUIS Take 1 tablet (5 mg total) by mouth 2 (two) times daily.   atorvastatin 40 MG tablet Commonly known as: LIPITOR 1 tablet  po daily What changed:  how much to take how to take this when to take this additional instructions   cholecalciferol 25 MCG (1000 UNIT)  tablet Commonly known as: VITAMIN D Take 1,000 Units by mouth daily.   CITRACAL + D PO Take 1 tablet by mouth daily.   famotidine 20 MG tablet Commonly known as: PEPCID Take 1 tablet (20 mg total) by mouth daily. What changed:  when to take this reasons to take this   levETIRAcetam 500 MG tablet Commonly known as: KEPPRA Take 1 tablet (500 mg total) by mouth 2 (two) times daily.   lisinopril 2.5 MG tablet Commonly known as: ZESTRIL Take 2.5 mg by mouth daily.   multivitamin capsule Take 1 capsule by mouth daily. Centrum Silver   polyethylene glycol 17 g packet Commonly known as: MIRALAX / GLYCOLAX Take 17 g by mouth daily as needed for moderate constipation.   vitamin B-12 1000 MCG tablet Commonly known as: CYANOCOBALAMIN Take 1,000 mcg by mouth daily.        Follow-up Information     [name], MD. Schedule an appointment as soon as possible for a visit in 1 week(s).   Specialty: Family Medicine Contact information: [place] [address] [phone]         Curt Bears, MD Follow up.   Specialty: Oncology Contact information: [place] [address] [phone]  Time coordinating discharge: 39 minutes  Signed:  Jakhi Dishman  [facility] 05/12/2021, 10:01 AM

## 2021-05-12 NOTE — Discharge Instructions (Signed)
Information on my medicine - ELIQUIS (apixaban)  This medication education was reviewed with me or my healthcare representative as part of my discharge preparation.   Why was Eliquis prescribed for you? Eliquis was prescribed to treat blood clots that may have been found in the veins of your legs (deep vein thrombosis) or in your lungs (pulmonary embolism) and to reduce the risk of them occurring again.  What do You need to know about Eliquis ? Take your Eliquis TWICE DAILY - one tablet in the morning and one tablet in the evening with or without food.  It would be best to take the doses about the same time each day.  If you have difficulty swallowing the tablet whole please discuss with your pharmacist how to take the medication safely.  Take Eliquis exactly as prescribed by your doctor and DO NOT stop taking Eliquis without talking to the doctor who prescribed the medication.  Stopping may increase your risk of developing a new clot or stroke.  Refill your prescription before you run out.  After discharge, you should have regular check-up appointments with your healthcare provider that is prescribing your Eliquis.  In the future your dose may need to be changed if your kidney function or weight changes by a significant amount or as you get older.  What do you do if you miss a dose? If you miss a dose, take it as soon as you remember on the same day and resume taking twice daily.  Do not take more than one dose of ELIQUIS at the same time.  Important Safety Information A possible side effect of Eliquis is bleeding. You should call your healthcare provider right away if you experience any of the following: Bleeding from an injury or your nose that does not stop. Unusual colored urine (red or dark brown) or unusual colored stools (red or black). Unusual bruising for unknown reasons. A serious fall or if you hit your head (even if there is no bleeding).  Some medicines may interact  with Eliquis and might increase your risk of bleeding or clotting while on Eliquis. To help avoid this, consult your healthcare provider or pharmacist prior to using any new prescription or non-prescription medications, including herbals, vitamins, non-steroidal anti-inflammatory drugs (NSAIDs) and supplements.  This website has more information on Eliquis (apixaban): http://www.eliquis.com/eliquis/home

## 2021-05-12 NOTE — Care Management Important Message (Signed)
Important Message  Patient Details IM Letter given to the Patient. Name: Katelyn Kennedy MRN: [id number] Date of Birth: [birth year]   Medicare Important Message Given:  Yes     [name] 05/12/2021, 2:49 PM

## 2021-05-12 NOTE — Progress Notes (Signed)
Physical Therapy Treatment Patient Details Name: Katelyn Kennedy MRN: [id number] DOB: 1945-07-06 Today's Date: 05/12/2021   History of Present Illness Pt is a 76 y.o. female presenting to the hospital sent from the oncology clinic for findings of pulmonary embolism on the CT scan. Medical history of lung cancer with brain metastasis (adenocarcinoma) history of chemoradiation in the past, craniotomy and resection of brain mass, peripheral vascular disease, and diabetes.    PT Comments    Pt reports difficulty getting OOB this morning with staff.  Pt assisted with standing and ambulating short distance in hallway.  Pt reports feeling a little stronger this afternoon.  Pt declines SNF and wishes to return home as she states she will have 24/7 assist.  Pt also has gait belt at home and aware to use initially upon d/c for safety.  Pt declined w/c however agreeable to [facility] upon d/c so notified RN.  Pt states she would still like to d/c home today.     Recommendations for follow up therapy are one component of a multi-disciplinary discharge planning process, led by the attending physician.  Recommendations may be updated based on patient status, additional functional criteria and insurance authorization.  Follow Up Recommendations  Home health PT     Assistance Recommended at Discharge Frequent or constant Supervision/Assistance  Patient can return home with the following A little help with walking and/or transfers;A little help with bathing/dressing/bathroom;Assistance with cooking/housework;Assist for transportation;Help with stairs or ramp for entrance   Equipment Recommendations  BSC/3in1    Recommendations for Other Services       Precautions / Restrictions Precautions Precautions: Fall     Mobility  Bed Mobility Overal bed mobility: Needs Assistance Bed Mobility: Supine to Sit, Sit to Supine     Supine to sit: Min guard, HOB elevated Sit to supine: Min guard, HOB elevated    General bed mobility comments: increased time and effort    Transfers Overall transfer level: Needs assistance Equipment used: Rolling walker (2 wheels) Transfers: Sit to/from Stand Sit to Stand: Min assist           General transfer comment: light assist to rise and steady, pt with posterior lean against bed to self assist    Ambulation/Gait Ambulation/Gait assistance: Min assist, Min guard Gait Distance (Feet): 40 Feet Assistive device: Rolling walker (2 wheels) Gait Pattern/deviations: Step-through pattern, Decreased stride length, Narrow base of support       General Gait Details: required cues for RW positioning, assist for manuevering RW with turning; pt reports fatigue however distance per pt tolerance   Stairs             Wheelchair Mobility    Modified Rankin (Stroke Patients Only)       Balance Overall balance assessment: Needs assistance         Standing balance support: Bilateral upper extremity supported, During functional activity, Reliant on assistive device for balance Standing balance-Leahy Scale: Poor                              Cognition Arousal/Alertness: Awake/alert Behavior During Therapy: WFL for tasks assessed/performed Overall Cognitive Status: Within Functional Limits for tasks assessed                                          Exercises  General Comments        Pertinent Vitals/Pain Pain Assessment Pain Assessment: No/denies pain    Home Living                          Prior Function            PT Goals (current goals can now be found in the care plan section) Progress towards PT goals: Progressing toward goals    Frequency    Min 3X/week      PT Plan Current plan remains appropriate;Equipment recommendations need to be updated    Co-evaluation              AM-PAC PT "6 Clicks" Mobility   Outcome Measure  Help needed turning from your back to  your side while in a flat bed without using bedrails?: A Little Help needed moving from lying on your back to sitting on the side of a flat bed without using bedrails?: A Little Help needed moving to and from a bed to a chair (including a wheelchair)?: A Little Help needed standing up from a chair using your arms (e.g., wheelchair or bedside chair)?: A Little Help needed to walk in hospital room?: A Little Help needed climbing 3-5 steps with a railing? : A Lot 6 Click Score: 17    End of Session Equipment Utilized During Treatment: Gait belt Activity Tolerance: Patient tolerated treatment well Patient left: in bed;with call bell/phone within reach Nurse Communication: Mobility status PT Visit Diagnosis: Unsteadiness on feet (R26.81);Muscle weakness (generalized) (M62.81);History of falling (Z91.81)     Time: 2440-1027 PT Time Calculation (min) (ACUTE ONLY): 25 min  Charges:  $Gait Training: 23-37 mins                    Jannette Spanner PT, DPT Acute Rehabilitation Services Pager: [phone] Office: [place] 05/12/2021, 3:35 PM

## 2021-05-13 ENCOUNTER — Telehealth (HOSPITAL_COMMUNITY): Payer: Self-pay | Admitting: *Deleted

## 2021-05-13 ENCOUNTER — Telehealth: Payer: Self-pay | Admitting: Internal Medicine

## 2021-05-13 DIAGNOSIS — I10 Essential (primary) hypertension: Secondary | ICD-10-CM | POA: Diagnosis not present

## 2021-05-13 DIAGNOSIS — I951 Orthostatic hypotension: Secondary | ICD-10-CM | POA: Diagnosis not present

## 2021-05-13 DIAGNOSIS — Z87891 Personal history of nicotine dependence: Secondary | ICD-10-CM | POA: Diagnosis not present

## 2021-05-13 DIAGNOSIS — E1151 Type 2 diabetes mellitus with diabetic peripheral angiopathy without gangrene: Secondary | ICD-10-CM | POA: Diagnosis not present

## 2021-05-13 DIAGNOSIS — R41841 Cognitive communication deficit: Secondary | ICD-10-CM | POA: Diagnosis not present

## 2021-05-13 DIAGNOSIS — C7931 Secondary malignant neoplasm of brain: Secondary | ICD-10-CM | POA: Diagnosis not present

## 2021-05-13 DIAGNOSIS — C3431 Malignant neoplasm of lower lobe, right bronchus or lung: Secondary | ICD-10-CM | POA: Diagnosis not present

## 2021-05-13 DIAGNOSIS — E785 Hyperlipidemia, unspecified: Secondary | ICD-10-CM | POA: Diagnosis not present

## 2021-05-13 DIAGNOSIS — Z86718 Personal history of other venous thrombosis and embolism: Secondary | ICD-10-CM | POA: Diagnosis not present

## 2021-05-13 DIAGNOSIS — E1165 Type 2 diabetes mellitus with hyperglycemia: Secondary | ICD-10-CM | POA: Diagnosis not present

## 2021-05-13 DIAGNOSIS — E1169 Type 2 diabetes mellitus with other specified complication: Secondary | ICD-10-CM | POA: Diagnosis not present

## 2021-05-13 DIAGNOSIS — E119 Type 2 diabetes mellitus without complications: Secondary | ICD-10-CM | POA: Diagnosis not present

## 2021-05-13 DIAGNOSIS — M1990 Unspecified osteoarthritis, unspecified site: Secondary | ICD-10-CM | POA: Diagnosis not present

## 2021-05-13 DIAGNOSIS — C3490 Malignant neoplasm of unspecified part of unspecified bronchus or lung: Secondary | ICD-10-CM | POA: Diagnosis not present

## 2021-05-13 DIAGNOSIS — I479 Paroxysmal tachycardia, unspecified: Secondary | ICD-10-CM | POA: Diagnosis not present

## 2021-05-13 DIAGNOSIS — T380X5D Adverse effect of glucocorticoids and synthetic analogues, subsequent encounter: Secondary | ICD-10-CM | POA: Diagnosis not present

## 2021-05-13 DIAGNOSIS — W19XXXD Unspecified fall, subsequent encounter: Secondary | ICD-10-CM | POA: Diagnosis not present

## 2021-05-13 DIAGNOSIS — S065X0D Traumatic subdural hemorrhage without loss of consciousness, subsequent encounter: Secondary | ICD-10-CM | POA: Diagnosis not present

## 2021-05-13 NOTE — Telephone Encounter (Signed)
Scheduled appt per 1/31 staff msg. Pt's sister is aware of appt date and time.

## 2021-05-13 NOTE — Telephone Encounter (Signed)
Katelyn Kennedy was contacted by telephone to verify understanding of discharge instructions status post their most recent discharge from the hospital on the date:  05/12/21.  Inpatient discharge AVS was re-reviewed with patient, along with cancer center appointments.  Verification of understanding for oncology specific follow-up was validated using the Teach Back method.  Follow up visit made with Dr Julien Nordmann for 06/11/21.  Transportation to appointments were confirmed for the patient as being self/caregiver.  Vedha Parkers questions were addressed to their satisfaction upon completion of this post discharge follow-up call for outpatient oncology.

## 2021-05-15 ENCOUNTER — Telehealth: Payer: Self-pay | Admitting: Family Medicine

## 2021-05-15 ENCOUNTER — Other Ambulatory Visit: Payer: Self-pay

## 2021-05-15 ENCOUNTER — Encounter: Payer: Self-pay | Admitting: Family Medicine

## 2021-05-15 ENCOUNTER — Other Ambulatory Visit: Payer: Self-pay | Admitting: Family Medicine

## 2021-05-15 ENCOUNTER — Ambulatory Visit (INDEPENDENT_AMBULATORY_CARE_PROVIDER_SITE_OTHER): Payer: PPO | Admitting: Family Medicine

## 2021-05-15 VITALS — BP 100/60 | HR 122 | Temp 97.7°F | Ht 59.0 in | Wt 134.0 lb

## 2021-05-15 DIAGNOSIS — Z23 Encounter for immunization: Secondary | ICD-10-CM | POA: Diagnosis not present

## 2021-05-15 DIAGNOSIS — I824Y2 Acute embolism and thrombosis of unspecified deep veins of left proximal lower extremity: Secondary | ICD-10-CM | POA: Diagnosis not present

## 2021-05-15 DIAGNOSIS — R0789 Other chest pain: Secondary | ICD-10-CM

## 2021-05-15 DIAGNOSIS — I10 Essential (primary) hypertension: Secondary | ICD-10-CM | POA: Diagnosis not present

## 2021-05-15 DIAGNOSIS — E1169 Type 2 diabetes mellitus with other specified complication: Secondary | ICD-10-CM | POA: Diagnosis not present

## 2021-05-15 DIAGNOSIS — S065X0D Traumatic subdural hemorrhage without loss of consciousness, subsequent encounter: Secondary | ICD-10-CM | POA: Diagnosis not present

## 2021-05-15 DIAGNOSIS — C7931 Secondary malignant neoplasm of brain: Secondary | ICD-10-CM | POA: Diagnosis not present

## 2021-05-15 DIAGNOSIS — E785 Hyperlipidemia, unspecified: Secondary | ICD-10-CM | POA: Diagnosis not present

## 2021-05-15 DIAGNOSIS — C3490 Malignant neoplasm of unspecified part of unspecified bronchus or lung: Secondary | ICD-10-CM

## 2021-05-15 DIAGNOSIS — I2699 Other pulmonary embolism without acute cor pulmonale: Secondary | ICD-10-CM | POA: Diagnosis not present

## 2021-05-15 DIAGNOSIS — R41841 Cognitive communication deficit: Secondary | ICD-10-CM | POA: Diagnosis not present

## 2021-05-15 DIAGNOSIS — C3431 Malignant neoplasm of lower lobe, right bronchus or lung: Secondary | ICD-10-CM

## 2021-05-15 DIAGNOSIS — E119 Type 2 diabetes mellitus without complications: Secondary | ICD-10-CM

## 2021-05-15 DIAGNOSIS — Z86718 Personal history of other venous thrombosis and embolism: Secondary | ICD-10-CM | POA: Diagnosis not present

## 2021-05-15 DIAGNOSIS — E1151 Type 2 diabetes mellitus with diabetic peripheral angiopathy without gangrene: Secondary | ICD-10-CM | POA: Diagnosis not present

## 2021-05-15 DIAGNOSIS — I951 Orthostatic hypotension: Secondary | ICD-10-CM | POA: Diagnosis not present

## 2021-05-15 DIAGNOSIS — R531 Weakness: Secondary | ICD-10-CM

## 2021-05-15 DIAGNOSIS — Z87891 Personal history of nicotine dependence: Secondary | ICD-10-CM | POA: Diagnosis not present

## 2021-05-15 DIAGNOSIS — M1990 Unspecified osteoarthritis, unspecified site: Secondary | ICD-10-CM | POA: Diagnosis not present

## 2021-05-15 DIAGNOSIS — W19XXXD Unspecified fall, subsequent encounter: Secondary | ICD-10-CM | POA: Diagnosis not present

## 2021-05-15 LAB — CBC WITH DIFFERENTIAL/PLATELET
Basophils Absolute: 0.1 10*3/uL (ref 0.0–0.1)
Basophils Relative: 0.7 % (ref 0.0–3.0)
Eosinophils Absolute: 0.2 10*3/uL (ref 0.0–0.7)
Eosinophils Relative: 1.6 % (ref 0.0–5.0)
HCT: 31.8 % — ABNORMAL LOW (ref 36.0–46.0)
Hemoglobin: 10.6 g/dL — ABNORMAL LOW (ref 12.0–15.0)
Lymphocytes Relative: 11.8 % — ABNORMAL LOW (ref 12.0–46.0)
Lymphs Abs: 1.2 10*3/uL (ref 0.7–4.0)
MCHC: 33.3 g/dL (ref 30.0–36.0)
MCV: 87.8 fl (ref 78.0–100.0)
Monocytes Absolute: 1.2 10*3/uL — ABNORMAL HIGH (ref 0.1–1.0)
Monocytes Relative: 11.5 % (ref 3.0–12.0)
Neutro Abs: 7.9 10*3/uL — ABNORMAL HIGH (ref 1.4–7.7)
Neutrophils Relative %: 74.4 % (ref 43.0–77.0)
Platelets: 428 10*3/uL — ABNORMAL HIGH (ref 150.0–400.0)
RBC: 3.62 Mil/uL — ABNORMAL LOW (ref 3.87–5.11)
RDW: 16.3 % — ABNORMAL HIGH (ref 11.5–15.5)
WBC: 10.6 10*3/uL — ABNORMAL HIGH (ref 4.0–10.5)

## 2021-05-15 LAB — BASIC METABOLIC PANEL
BUN: 11 mg/dL (ref 6–23)
CO2: 27 mEq/L (ref 19–32)
Calcium: 9.9 mg/dL (ref 8.4–10.5)
Chloride: 99 mEq/L (ref 96–112)
Creatinine, Ser: 0.66 mg/dL (ref 0.40–1.20)
GFR: 84.75 mL/min (ref >60.00)
Glucose, Bld: 159 mg/dL — ABNORMAL HIGH (ref 70–99)
Potassium: 4.5 mEq/L (ref 3.5–5.1)
Sodium: 133 mEq/L — ABNORMAL LOW (ref 135–145)

## 2021-05-15 LAB — MAGNESIUM: Magnesium: 1.8 mg/dL (ref 1.5–2.5)

## 2021-05-15 LAB — HEMOGLOBIN A1C: Hgb A1c MFr Bld: 6.5 % (ref 4.6–6.5)

## 2021-05-15 NOTE — Assessment & Plan Note (Signed)
Due for re-eval A1C and glucose.  Likely improved off Decadron.

## 2021-05-15 NOTE — Assessment & Plan Note (Signed)
Acute, now on eliquis anticoagulation.  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  Due for cbc today.

## 2021-05-15 NOTE — Progress Notes (Signed)
[facility] notified of DME order through [employer] in [place].

## 2021-05-15 NOTE — Progress Notes (Signed)
Patient ID: VALENCIA KASSA, female    DOB: Oct 03, 1945, 76 y.o.   MRN: [id number]  This visit was conducted in person.  BP 100/60    Pulse (!) 122    Temp 97.7 F (36.5 C) (Temporal)    Ht 4\' 11"  (1.499 m)    Wt 134 lb (60.8 kg)    SpO2 97%    BMI 27.06 kg/m    CC:  Chief Complaint  Patient presents with   Follow-up    Steroid Induced Hyperglycemia    Subjective:   HPI: GREY SCHLAUCH is a 76 y.o. female with edical history of lung cancer with brain metastasis (adenocarcinoma) history of chemoradiation in the past, craniotomy and resection of brain mass, peripheral vascular disease, diabetes diet-controlled,presenting on 05/15/2021 for Follow-up (Steroid Induced Hyperglycemia) And hospital follow up.   Hospital  admission 1/26- 1/29 Dx with Pulmonary embolus Reviewed discharge summary in detail. Acute pulmonary embolism.   Diagnosed on CT chest scan of the chest.  Neuro oncology and oncology recommended heparin drip due to hemorrhagic metastasis and need for closer monitoring. Troponin was within normal range.  CTA described some right heart strain patient remained hemodynamically stable with normal troponins..  2D echocardiogram showed LV ejection fraction of 60 to 65% with grade 1 diastolic dysfunction.  Patient remained hemodynamically stable during hospitalization without any evidence of bleeding hemoglobin remained stable including clinical findings.  I spoke with Dr. Marin Olp who recommended Eliquis 5 mg twice daily on discharge with outpatient oncology follow-up.     Left lower extremity DVT.  Continue on Eliquis on discharge.   History of lung cancer metastatic to the brain.  Being followed by oncology and neuro-oncology as outpatient.  Oncology alert the patient during hospitalization on heparin drip.  At this time this has been transitioned to Eliquis    Diabetes mellitus type 2 not on medication.  Diet controlled.     History of hyperlipidemia.  Continue with Lipitor.    Generalized weakness, fatigue.  PT evaluation recommend home PT on discharge.  Arranged on home PT on discharge.    She was doing better with strength when came home from rehab.   She has been feeling tired and weak.  She has had off and on PT.  Up an down appetite.   Pain in right axillae... since being pulled up on bed in hospital.  No bruising, mild swelling.  Steroid induced hyperglycemia: now off decadron.  Taking glucerna. Drinking lots of water.   Re-eval. Lab Results  Component Value Date   HGBA1C 4.9 03/05/2021    No SOB, no chest pain.     Has appt next week with oncology.  Relevant past medical, surgical, family and social history reviewed and updated as indicated. Interim medical history since our last visit reviewed. Allergies and medications reviewed and updated. Outpatient Medications Prior to Visit  Medication Sig Dispense Refill   amitriptyline (ELAVIL) 50 MG tablet Take 1 tablet (50 mg total) by mouth at bedtime. 90 tablet 3   apixaban (ELIQUIS) 5 MG TABS tablet Take 1 tablet (5 mg total) by mouth 2 (two) times daily. 60 tablet 2   atorvastatin (LIPITOR) 40 MG tablet 1 tablet  po daily 90 tablet 3   cholecalciferol (VITAMIN D) 25 MCG (1000 UNIT) tablet Take 1,000 Units by mouth daily.     famotidine (PEPCID) 20 MG tablet Take 1 tablet (20 mg total) by mouth daily. (Patient taking differently: Take 20 mg by mouth daily  as needed for heartburn or indigestion.) 30 tablet 0   levETIRAcetam (KEPPRA) 500 MG tablet Take 1 tablet (500 mg total) by mouth 2 (two) times daily. 60 tablet 3   lisinopril (ZESTRIL) 2.5 MG tablet Take 2.5 mg by mouth daily.     Multiple Vitamin (MULTIVITAMIN) capsule Take 1 capsule by mouth daily. Centrum Silver     polyethylene glycol (MIRALAX / GLYCOLAX) 17 g packet Take 17 g by mouth daily as needed for moderate constipation. 14 each 0   vitamin B-12 (CYANOCOBALAMIN) 1000 MCG tablet Take 1,000 mcg by mouth daily.     Calcium  Citrate-Vitamin D (CITRACAL + D PO) Take 1 tablet by mouth daily. (Patient not taking: Reported on 05/15/2021)     No facility-administered medications prior to visit.     Per HPI unless specifically indicated in ROS section below Review of Systems  Constitutional:  Positive for fatigue. Negative for fever.  HENT:  Negative for congestion and ear pain.   Eyes:  Negative for pain.  Respiratory:  Negative for cough, chest tightness and shortness of breath.   Cardiovascular:  Negative for chest pain, palpitations and leg swelling.  Gastrointestinal:  Negative for abdominal pain.  Genitourinary:  Negative for dysuria and vaginal bleeding.  Musculoskeletal:  Negative for back pain.       Left chest wall pain following nurse tech moving her up in bed in the hospital  Neurological:  Positive for weakness. Negative for syncope, light-headedness and headaches.  Psychiatric/Behavioral:  Negative for dysphoric mood.   Objective:  BP 100/60    Pulse (!) 122    Temp 97.7 F (36.5 C) (Temporal)    Ht 4\' 11"  (1.499 m)    Wt 134 lb (60.8 kg)    SpO2 97%    BMI 27.06 kg/m   Wt Readings from Last 3 Encounters:  05/15/21 134 lb (60.8 kg)  05/12/21 143 lb 1.3 oz (64.9 kg)  05/08/21 137 lb 4.8 oz (62.3 kg)      Physical Exam    Results for orders placed or performed during the hospital encounter of 05/08/21  Resp Panel by RT-PCR (Flu A&B, Covid) Nasopharyngeal Swab   Specimen: Nasopharyngeal Swab; Nasopharyngeal(NP) swabs in vial transport medium  Result Value Ref Range   SARS Coronavirus 2 by RT PCR NEGATIVE NEGATIVE   Influenza A by PCR NEGATIVE NEGATIVE   Influenza B by PCR NEGATIVE NEGATIVE  CBC  Result Value Ref Range   WBC 8.2 4.0 - 10.5 K/uL   RBC 3.57 (L) 3.87 - 5.11 MIL/uL   Hemoglobin 10.9 (L) 12.0 - 15.0 g/dL   HCT 32.8 (L) 36.0 - 46.0 %   MCV 91.9 80.0 - 100.0 fL   MCH 30.5 26.0 - 34.0 pg   MCHC 33.2 30.0 - 36.0 g/dL   RDW 15.6 (H) 11.5 - 15.5 %   Platelets 247 150 - 400 K/uL    nRBC 0.0 0.0 - 0.2 %  Basic metabolic panel  Result Value Ref Range   Sodium 136 135 - 145 mmol/L   Potassium 4.0 3.5 - 5.1 mmol/L   Chloride 103 98 - 111 mmol/L   CO2 26 22 - 32 mmol/L   Glucose, Bld 107 (H) 70 - 99 mg/dL   BUN 10 8 - 23 mg/dL   Creatinine, Ser 0.58 0.44 - 1.00 mg/dL   Calcium 9.4 8.9 - 10.3 mg/dL   GFR, Estimated >60 >60 mL/min   Anion gap 7 5 - 15  Heparin level (  unfractionated)  Result Value Ref Range   Heparin Unfractionated <0.10 (L) 0.30 - 0.70 IU/mL  CBC  Result Value Ref Range   WBC 8.2 4.0 - 10.5 K/uL   RBC 3.39 (L) 3.87 - 5.11 MIL/uL   Hemoglobin 10.3 (L) 12.0 - 15.0 g/dL   HCT 32.0 (L) 36.0 - 46.0 %   MCV 94.4 80.0 - 100.0 fL   MCH 30.4 26.0 - 34.0 pg   MCHC 32.2 30.0 - 36.0 g/dL   RDW 15.9 (H) 11.5 - 15.5 %   Platelets 249 150 - 400 K/uL   nRBC 0.0 0.0 - 0.2 %  Comprehensive metabolic panel  Result Value Ref Range   Sodium 136 135 - 145 mmol/L   Potassium 4.0 3.5 - 5.1 mmol/L   Chloride 104 98 - 111 mmol/L   CO2 24 22 - 32 mmol/L   Glucose, Bld 172 (H) 70 - 99 mg/dL   BUN 13 8 - 23 mg/dL   Creatinine, Ser 0.56 0.44 - 1.00 mg/dL   Calcium 8.8 (L) 8.9 - 10.3 mg/dL   Total Protein 6.3 (L) 6.5 - 8.1 g/dL   Albumin 2.9 (L) 3.5 - 5.0 g/dL   AST 27 15 - 41 U/L   ALT 21 0 - 44 U/L   Alkaline Phosphatase 110 38 - 126 U/L   Total Bilirubin 0.5 0.3 - 1.2 mg/dL   GFR, Estimated >60 >60 mL/min   Anion gap 8 5 - 15  Magnesium  Result Value Ref Range   Magnesium 2.0 1.7 - 2.4 mg/dL  Phosphorus  Result Value Ref Range   Phosphorus 3.7 2.5 - 4.6 mg/dL  Heparin level (unfractionated)  Result Value Ref Range   Heparin Unfractionated 0.48 0.30 - 0.70 IU/mL  Glucose, capillary  Result Value Ref Range   Glucose-Capillary 159 (H) 70 - 99 mg/dL  Heparin level (unfractionated)  Result Value Ref Range   Heparin Unfractionated 0.49 0.30 - 0.70 IU/mL  Glucose, capillary  Result Value Ref Range   Glucose-Capillary 130 (H) 70 - 99 mg/dL  CBC  Result  Value Ref Range   WBC 7.6 4.0 - 10.5 K/uL   RBC 3.43 (L) 3.87 - 5.11 MIL/uL   Hemoglobin 10.1 (L) 12.0 - 15.0 g/dL   HCT 31.5 (L) 36.0 - 46.0 %   MCV 91.8 80.0 - 100.0 fL   MCH 29.4 26.0 - 34.0 pg   MCHC 32.1 30.0 - 36.0 g/dL   RDW 15.4 11.5 - 15.5 %   Platelets 275 150 - 400 K/uL   nRBC 0.0 0.0 - 0.2 %  Heparin level (unfractionated)  Result Value Ref Range   Heparin Unfractionated 0.36 0.30 - 0.70 IU/mL  CBC  Result Value Ref Range   WBC 8.1 4.0 - 10.5 K/uL   RBC 3.78 (L) 3.87 - 5.11 MIL/uL   Hemoglobin 11.1 (L) 12.0 - 15.0 g/dL   HCT 35.0 (L) 36.0 - 46.0 %   MCV 92.6 80.0 - 100.0 fL   MCH 29.4 26.0 - 34.0 pg   MCHC 31.7 30.0 - 36.0 g/dL   RDW 15.6 (H) 11.5 - 15.5 %   Platelets 321 150 - 400 K/uL   nRBC 0.0 0.0 - 0.2 %  Heparin level (unfractionated)  Result Value Ref Range   Heparin Unfractionated 0.24 (L) 0.30 - 0.70 IU/mL  Heparin level (unfractionated)  Result Value Ref Range   Heparin Unfractionated 0.35 0.30 - 0.70 IU/mL  Heparin level (unfractionated)  Result Value Ref Range  Heparin Unfractionated 0.36 0.30 - 0.70 IU/mL  CBC  Result Value Ref Range   WBC 7.8 4.0 - 10.5 K/uL   RBC 3.56 (L) 3.87 - 5.11 MIL/uL   Hemoglobin 10.5 (L) 12.0 - 15.0 g/dL   HCT 33.2 (L) 36.0 - 46.0 %   MCV 93.3 80.0 - 100.0 fL   MCH 29.5 26.0 - 34.0 pg   MCHC 31.6 30.0 - 36.0 g/dL   RDW 15.5 11.5 - 15.5 %   Platelets 314 150 - 400 K/uL   nRBC 0.0 0.0 - 0.2 %  Heparin level (unfractionated)  Result Value Ref Range   Heparin Unfractionated 0.33 0.30 - 0.70 IU/mL  Glucose, capillary  Result Value Ref Range   Glucose-Capillary 228 (H) 70 - 99 mg/dL  Glucose, capillary  Result Value Ref Range   Glucose-Capillary 139 (H) 70 - 99 mg/dL  Glucose, capillary  Result Value Ref Range   Glucose-Capillary 112 (H) 70 - 99 mg/dL  Glucose, capillary  Result Value Ref Range   Glucose-Capillary 133 (H) 70 - 99 mg/dL  I-stat chem 8, ED (not at [facility] or [facility])  Result Value Ref Range   Sodium  136 135 - 145 mmol/L   Potassium 4.0 3.5 - 5.1 mmol/L   Chloride 102 98 - 111 mmol/L   BUN 8 8 - 23 mg/dL   Creatinine, Ser 0.50 0.44 - 1.00 mg/dL   Glucose, Bld 106 (H) 70 - 99 mg/dL   Calcium, Ion 1.26 1.15 - 1.40 mmol/L   TCO2 26 22 - 32 mmol/L   Hemoglobin 10.9 (L) 12.0 - 15.0 g/dL   HCT 32.0 (L) 36.0 - 46.0 %  ECHOCARDIOGRAM COMPLETE  Result Value Ref Range   Weight 2,144 oz   Height 58 in   BP 125/62 mmHg   S' Lateral 2.40 cm   AR max vel 2.82 cm2   AV Area VTI 3.02 cm2   AV Mean grad 3.0 mmHg   AV Peak grad 5.5 mmHg   Ao pk vel 1.17 m/s   Area-P 1/2 5.23 cm2   AV Area mean vel 2.79 cm2  Troponin I (High Sensitivity)  Result Value Ref Range   Troponin I (High Sensitivity) 4 <18 ng/L    This visit occurred during the SARS-CoV-2 public health emergency.  Safety protocols were in place, including screening questions prior to the visit, additional usage of staff PPE, and extensive cleaning of exam room while observing appropriate contact time as indicated for disinfecting solutions.   COVID 19 screen:  No recent travel or known exposure to COVID19 The patient denies respiratory symptoms of COVID 19 at this time. The importance of social distancing was discussed today.   Assessment and Plan    Problem List Items Addressed This Visit     Acute deep vein thrombosis (DVT) of proximal vein of left lower extremity (HCC)    Acute, now on eliquis anticoagulation.  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  Due for cbc today.      Acute pulmonary embolism (HCC)    Acute, now on eliquis anticoagulation.  Follow closely given history of hemmorhagic mets in brain.  due for cbc today.       Relevant Orders   CBC with Differential/Platelet   Brain metastasis ([place])    Has follow up wit neuro-oncology as well as upcoming visit with palliative care.      Diet-controlled type 2 diabetes mellitus ([place]) - Primary    Due for re-eval  A1C and glucose.  Likely  improved off Decadron.      Relevant Orders   Basic Metabolic Panel   Hemoglobin A1c   Non-small cell lung cancer metastatic to brain [facility])    Has follow up with oncology next week.      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 doubt related to PE.  Use tylenol for pain, ice and start gentle stretching. Make sure to take deep breaths as able.  Consider lidocaine patches if pain not controlled.  Today it has started improving.      Other Visit Diagnoses     Hypomagnesemia       Relevant Orders   Magnesium   Need for influenza vaccination       Relevant Orders   Flu Vaccine QUAD High Dose(Fluad) (Completed)      Orders Placed This Encounter  Procedures   Flu Vaccine QUAD High Dose(Fluad)   CBC with Differential/Platelet   Basic Metabolic Panel   Magnesium   Hemoglobin A1c      Eliezer Lofts, MD

## 2021-05-15 NOTE — Telephone Encounter (Signed)
Mrs. Verdene Lennert called in and wanted to see if [name] would put in a DME order for the 3-1 commode and send it through [facility]. The reason is for having safety measures.

## 2021-05-15 NOTE — Patient Instructions (Addendum)
Can use Tylenol ES as needed for pain in right chest wall.  Can use Pepcid as needed.  Please stop at the lab to have labs drawn.

## 2021-05-15 NOTE — Assessment & Plan Note (Signed)
Acute, now on eliquis anticoagulation.  Follow closely given history of hemmorhagic mets in brain.  due for cbc today.

## 2021-05-15 NOTE — Telephone Encounter (Signed)
Home Health verbal orders Caller Name: [place] Name: [place] number: [phone]  Requesting OT/PT/Skilled nursing/Social Work/Speech: PT  Reason: mobile strengthen, balance training, gait training   Frequency: 2w4  Please forward to [facility] pool or providers CMA

## 2021-05-15 NOTE — Progress Notes (Signed)
DME order placed.

## 2021-05-15 NOTE — Assessment & Plan Note (Addendum)
Has follow up wit neuro-oncology as well as upcoming visit with palliative care.

## 2021-05-15 NOTE — Assessment & Plan Note (Signed)
Chest wall ttp and pain started after being lifted... doubt related to PE.  Use tylenol for pain, ice and start gentle stretching. Make sure to take deep breaths as able.  Consider lidocaine patches if pain not controlled.  Today it has started improving.

## 2021-05-15 NOTE — Assessment & Plan Note (Signed)
Has follow up with oncology next week.

## 2021-05-15 NOTE — Telephone Encounter (Signed)
Left message for Freda Munro giving verbal orders for PT   Reason: mobile strengthen, balance training, gait training    Frequency: 2 x week for 4 weeks.

## 2021-05-16 ENCOUNTER — Telehealth: Payer: Self-pay | Admitting: Internal Medicine

## 2021-05-16 DIAGNOSIS — R9089 Other abnormal findings on diagnostic imaging of central nervous system: Secondary | ICD-10-CM | POA: Diagnosis not present

## 2021-05-16 DIAGNOSIS — M6281 Muscle weakness (generalized): Secondary | ICD-10-CM | POA: Diagnosis not present

## 2021-05-16 DIAGNOSIS — C3490 Malignant neoplasm of unspecified part of unspecified bronchus or lung: Secondary | ICD-10-CM | POA: Diagnosis not present

## 2021-05-16 DIAGNOSIS — R262 Difficulty in walking, not elsewhere classified: Secondary | ICD-10-CM | POA: Diagnosis not present

## 2021-05-16 DIAGNOSIS — R531 Weakness: Secondary | ICD-10-CM | POA: Diagnosis not present

## 2021-05-16 DIAGNOSIS — I619 Nontraumatic intracerebral hemorrhage, unspecified: Secondary | ICD-10-CM | POA: Diagnosis not present

## 2021-05-16 NOTE — Telephone Encounter (Signed)
Sch per 2/3 inbasket,pt sister aware

## 2021-05-19 ENCOUNTER — Inpatient Hospital Stay: Payer: PPO | Attending: Internal Medicine | Admitting: Nurse Practitioner

## 2021-05-19 ENCOUNTER — Encounter: Payer: Self-pay | Admitting: Nurse Practitioner

## 2021-05-19 DIAGNOSIS — C7931 Secondary malignant neoplasm of brain: Secondary | ICD-10-CM | POA: Diagnosis not present

## 2021-05-19 DIAGNOSIS — Z86718 Personal history of other venous thrombosis and embolism: Secondary | ICD-10-CM | POA: Diagnosis not present

## 2021-05-19 DIAGNOSIS — Z87891 Personal history of nicotine dependence: Secondary | ICD-10-CM | POA: Diagnosis not present

## 2021-05-19 DIAGNOSIS — R41841 Cognitive communication deficit: Secondary | ICD-10-CM | POA: Diagnosis not present

## 2021-05-19 DIAGNOSIS — S065X0D Traumatic subdural hemorrhage without loss of consciousness, subsequent encounter: Secondary | ICD-10-CM | POA: Diagnosis not present

## 2021-05-19 DIAGNOSIS — I951 Orthostatic hypotension: Secondary | ICD-10-CM | POA: Diagnosis not present

## 2021-05-19 DIAGNOSIS — M1990 Unspecified osteoarthritis, unspecified site: Secondary | ICD-10-CM | POA: Diagnosis not present

## 2021-05-19 DIAGNOSIS — Z515 Encounter for palliative care: Secondary | ICD-10-CM

## 2021-05-19 DIAGNOSIS — E1151 Type 2 diabetes mellitus with diabetic peripheral angiopathy without gangrene: Secondary | ICD-10-CM | POA: Diagnosis not present

## 2021-05-19 DIAGNOSIS — W19XXXD Unspecified fall, subsequent encounter: Secondary | ICD-10-CM | POA: Diagnosis not present

## 2021-05-19 DIAGNOSIS — E119 Type 2 diabetes mellitus without complications: Secondary | ICD-10-CM | POA: Diagnosis not present

## 2021-05-19 DIAGNOSIS — R531 Weakness: Secondary | ICD-10-CM

## 2021-05-19 DIAGNOSIS — C3490 Malignant neoplasm of unspecified part of unspecified bronchus or lung: Secondary | ICD-10-CM | POA: Diagnosis not present

## 2021-05-19 DIAGNOSIS — E1169 Type 2 diabetes mellitus with other specified complication: Secondary | ICD-10-CM | POA: Diagnosis not present

## 2021-05-19 DIAGNOSIS — E785 Hyperlipidemia, unspecified: Secondary | ICD-10-CM | POA: Diagnosis not present

## 2021-05-19 DIAGNOSIS — I10 Essential (primary) hypertension: Secondary | ICD-10-CM | POA: Diagnosis not present

## 2021-05-19 DIAGNOSIS — C3431 Malignant neoplasm of lower lobe, right bronchus or lung: Secondary | ICD-10-CM | POA: Diagnosis not present

## 2021-05-19 NOTE — Progress Notes (Signed)
[place]  Telephone:([phone] Fax:(336) [phone]   Name: Katelyn Kennedy Date: 05/19/2021 MRN: [id number]  DOB: 1945/07/25  Patient Care Team: [name], MD as PCP - General [name], [facility] as Pharmacist (Pharmacist) Gery Pray, MD as Consulting Physician (Radiation Oncology) Pickenpack-Cousar, Carlena Sax, NP as Nurse Practitioner (Nurse Practitioner)   I connected with [name] on 05/19/21 at 10:45 AM EST by phone and verified that I am speaking with the correct person using two identifiers.   I discussed the limitations, risks, security and privacy concerns of performing an evaluation and management service by telemedicine and the availability of in-person appointments. I also discussed with the patient that there may be a patient responsible charge related to this service. The patient expressed understanding and agreed to proceed.   Other persons participating in the visit and their role in the encounter: [name] (Sister-in-law)  Patients location: Home  Providers location: [place]    INTERVAL HISTORY: DYNEISHA Kennedy is a 76 y.o. 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  She was admitted on 03/05/2021 from home with worsening left-sided weakness. Discharged to SNF rehab. CT of head showed progression of metastatic lung cancer with multiple hemorrhagic lesions and vasogenic edema.  Patient followed by Dr. Sondra Come ([place]) and Dr. Julien Nordmann. Recently completed whole brain radiation. Patient was recently admitted on 05/08/21 where she was found to have a submassive PE with right heart strain and new lung nodules. She was discharged on Eliquis s/p heparin drip.   SOCIAL HISTORY:     reports that she quit smoking about 4 years ago. Her smoking use included cigarettes.  She has a 20.00 pack-year smoking history. She has never used smokeless tobacco. She reports that she does not currently use alcohol. She reports that she does not use drugs.  ADVANCE DIRECTIVES:  Completed during recent hospitalization. Patient named her sister [name] as her primary medical decision maker given her daughters both live out of state.  CODE STATUS: DNR  PAST MEDICAL HISTORY: Past Medical History:  Diagnosis Date   Allergic rhinitis    Arthritis    Diabetes mellitus without complication ([place])    diet control/no meds per pt   Katelyn    Essential hypertension 04/11/2019   History of chicken pox    History of hiatal hernia    History of radiation therapy    right lung - 06/28/2017-08/11/2017  Dr Kyung Rudd   History of radiation therapy    SRS brain - 11/29/2020-12/04/2020  Dr Jenny Reichmann [place]/Matthew Tammi Klippel   History of radiation therapy    Whole brain - 03/10/2021-03/19/2021 Dr Gery Pray   lung ca dx'd 04/2017   Lung cancer ([place])    Peripheral vascular disease ([place])    Pneumonia    PONV (postoperative nausea and vomiting)    Smoker     ALLERGIES:  is allergic to codeine.  MEDICATIONS:  Current Outpatient Medications  Medication Sig Dispense Refill   amitriptyline (ELAVIL) 50 MG tablet Take 1 tablet (50 mg total) by mouth at bedtime. 90 tablet 3   apixaban (ELIQUIS) 5 MG TABS tablet Take 1 tablet (5 mg total) by mouth 2 (two) times daily. 60 tablet 2   atorvastatin (LIPITOR) 40 MG tablet 1 tablet  po daily 90 tablet 3   Calcium Citrate-Vitamin D (CITRACAL + D PO)  Take 1 tablet by mouth daily. (Patient not taking: Reported on 05/15/2021)     cholecalciferol (VITAMIN D) 25 MCG (1000 UNIT) tablet Take 1,000 Units by mouth daily.     levETIRAcetam (KEPPRA) 500 MG tablet Take 1 tablet (500 mg total) by mouth 2 (two) times daily. 60 tablet 3   lisinopril (ZESTRIL) 2.5 MG tablet Take 2.5 mg by mouth daily.     Multiple Vitamin (MULTIVITAMIN) capsule Take 1 capsule by  mouth daily. Centrum Silver     polyethylene glycol (MIRALAX / GLYCOLAX) 17 g packet Take 17 g by mouth daily as needed for moderate constipation. 14 each 0   vitamin B-12 (CYANOCOBALAMIN) 1000 MCG tablet Take 1,000 mcg by mouth daily.     No current facility-administered medications for this visit.    VITAL SIGNS: There were no vitals taken for this visit. There were no vitals filed for this visit.  Estimated body mass index is 27.06 kg/m as calculated from the following:   Height as of 05/15/21: 4\' 11"  (1.499 m).   Weight as of 05/15/21: 134 lb (60.8 kg).   PERFORMANCE STATUS (ECOG) : 2 - Symptomatic, <50% confined to bed  IMPRESSION:  Ms. Cashen was recently discharged home from recent hospitalization. [name] shares since returning home patient continues to show signs of ongoing weakness.   We discussed at length her current illness and disease trajectory. Family acknowledges after each hospitalization patient has a difficult time "bouncing back!"   [name] shares family and friends are now rotating to provided 24/7 assistance. Patient requires assistance with ADLs. She has not been able to get into the shower due to fear of falling. Using a Rolator and BSC. Family is requesting shower bench. Home Health PT is coming out during the week.   [name] shares patient's daughters are coming in this weekend to spend 1-2 weeks with patient. Her daughters live in [place] and [place].   Reports her appetite has improved. Denies pain.    I expressed concerns with patients decline and ongoing health challenges. Patient and family verbalized understanding. Ms. Nilsen expresses she is remaining hopeful but with awareness of challenges.   I discussed the importance of continued conversation with family and their medical providers regarding overall plan of care and treatment options, ensuring decisions are within the context of the patients values and GOCs.  PLAN: Shower bench and hand-held shower  head order for additional home support and independently. Ongoing goals of care discussions No current symptom needs at this time I will plan to follow-up with her in 3-4 weeks.  Patient expressed understanding and was in agreement with this plan. She also understands that She can call the clinic at any time with any questions, concerns, or complaints.   Time Total: 40 min  Visit consisted of counseling and education dealing with the complex and emotionally intense issues of symptom management and palliative care in the setting of serious and potentially life-threatening illness.Greater than 50%  of this time was spent counseling and coordinating care related to the above assessment and plan.  Signed by: Alda Lea, AGPCNP-BC [place]

## 2021-05-20 DIAGNOSIS — I2699 Other pulmonary embolism without acute cor pulmonale: Secondary | ICD-10-CM | POA: Diagnosis not present

## 2021-05-20 DIAGNOSIS — I119 Hypertensive heart disease without heart failure: Secondary | ICD-10-CM | POA: Diagnosis not present

## 2021-05-20 DIAGNOSIS — C3431 Malignant neoplasm of lower lobe, right bronchus or lung: Secondary | ICD-10-CM | POA: Diagnosis not present

## 2021-05-20 DIAGNOSIS — I2510 Atherosclerotic heart disease of native coronary artery without angina pectoris: Secondary | ICD-10-CM | POA: Diagnosis not present

## 2021-05-20 DIAGNOSIS — E1151 Type 2 diabetes mellitus with diabetic peripheral angiopathy without gangrene: Secondary | ICD-10-CM | POA: Diagnosis not present

## 2021-05-20 DIAGNOSIS — I82432 Acute embolism and thrombosis of left popliteal vein: Secondary | ICD-10-CM | POA: Diagnosis not present

## 2021-05-20 DIAGNOSIS — C7931 Secondary malignant neoplasm of brain: Secondary | ICD-10-CM | POA: Diagnosis not present

## 2021-05-22 ENCOUNTER — Other Ambulatory Visit: Payer: Self-pay | Admitting: Radiation Therapy

## 2021-05-22 ENCOUNTER — Telehealth: Payer: Self-pay | Admitting: Family Medicine

## 2021-05-22 DIAGNOSIS — Z87891 Personal history of nicotine dependence: Secondary | ICD-10-CM | POA: Diagnosis not present

## 2021-05-22 DIAGNOSIS — Z86718 Personal history of other venous thrombosis and embolism: Secondary | ICD-10-CM | POA: Diagnosis not present

## 2021-05-22 DIAGNOSIS — I951 Orthostatic hypotension: Secondary | ICD-10-CM | POA: Diagnosis not present

## 2021-05-22 DIAGNOSIS — E1151 Type 2 diabetes mellitus with diabetic peripheral angiopathy without gangrene: Secondary | ICD-10-CM | POA: Diagnosis not present

## 2021-05-22 DIAGNOSIS — M1990 Unspecified osteoarthritis, unspecified site: Secondary | ICD-10-CM | POA: Diagnosis not present

## 2021-05-22 DIAGNOSIS — E119 Type 2 diabetes mellitus without complications: Secondary | ICD-10-CM | POA: Diagnosis not present

## 2021-05-22 DIAGNOSIS — E785 Hyperlipidemia, unspecified: Secondary | ICD-10-CM | POA: Diagnosis not present

## 2021-05-22 DIAGNOSIS — I10 Essential (primary) hypertension: Secondary | ICD-10-CM | POA: Diagnosis not present

## 2021-05-22 DIAGNOSIS — W19XXXD Unspecified fall, subsequent encounter: Secondary | ICD-10-CM | POA: Diagnosis not present

## 2021-05-22 DIAGNOSIS — C7931 Secondary malignant neoplasm of brain: Secondary | ICD-10-CM | POA: Diagnosis not present

## 2021-05-22 DIAGNOSIS — E1169 Type 2 diabetes mellitus with other specified complication: Secondary | ICD-10-CM | POA: Diagnosis not present

## 2021-05-22 DIAGNOSIS — C3431 Malignant neoplasm of lower lobe, right bronchus or lung: Secondary | ICD-10-CM | POA: Diagnosis not present

## 2021-05-22 DIAGNOSIS — R41841 Cognitive communication deficit: Secondary | ICD-10-CM | POA: Diagnosis not present

## 2021-05-22 DIAGNOSIS — S065X0D Traumatic subdural hemorrhage without loss of consciousness, subsequent encounter: Secondary | ICD-10-CM | POA: Diagnosis not present

## 2021-05-22 NOTE — Telephone Encounter (Signed)
Katelyn Kennedy with [facility] called to get verbal orders for Speech Therapy   1wk for 5wks

## 2021-05-22 NOTE — Telephone Encounter (Signed)
Verbal orders given to [facility] with [facility] for speech therapy 1 x week for 5 weeks.

## 2021-05-23 ENCOUNTER — Telehealth: Payer: Self-pay | Admitting: Family Medicine

## 2021-05-23 DIAGNOSIS — I10 Essential (primary) hypertension: Secondary | ICD-10-CM | POA: Diagnosis not present

## 2021-05-23 DIAGNOSIS — Z87891 Personal history of nicotine dependence: Secondary | ICD-10-CM | POA: Diagnosis not present

## 2021-05-23 DIAGNOSIS — M1990 Unspecified osteoarthritis, unspecified site: Secondary | ICD-10-CM | POA: Diagnosis not present

## 2021-05-23 DIAGNOSIS — E1169 Type 2 diabetes mellitus with other specified complication: Secondary | ICD-10-CM | POA: Diagnosis not present

## 2021-05-23 DIAGNOSIS — C7931 Secondary malignant neoplasm of brain: Secondary | ICD-10-CM | POA: Diagnosis not present

## 2021-05-23 DIAGNOSIS — C3431 Malignant neoplasm of lower lobe, right bronchus or lung: Secondary | ICD-10-CM | POA: Diagnosis not present

## 2021-05-23 DIAGNOSIS — W19XXXD Unspecified fall, subsequent encounter: Secondary | ICD-10-CM | POA: Diagnosis not present

## 2021-05-23 DIAGNOSIS — E119 Type 2 diabetes mellitus without complications: Secondary | ICD-10-CM | POA: Diagnosis not present

## 2021-05-23 DIAGNOSIS — S065X0D Traumatic subdural hemorrhage without loss of consciousness, subsequent encounter: Secondary | ICD-10-CM | POA: Diagnosis not present

## 2021-05-23 DIAGNOSIS — R41841 Cognitive communication deficit: Secondary | ICD-10-CM | POA: Diagnosis not present

## 2021-05-23 DIAGNOSIS — I951 Orthostatic hypotension: Secondary | ICD-10-CM | POA: Diagnosis not present

## 2021-05-23 DIAGNOSIS — Z86718 Personal history of other venous thrombosis and embolism: Secondary | ICD-10-CM | POA: Diagnosis not present

## 2021-05-23 DIAGNOSIS — E785 Hyperlipidemia, unspecified: Secondary | ICD-10-CM | POA: Diagnosis not present

## 2021-05-23 DIAGNOSIS — E1151 Type 2 diabetes mellitus with diabetic peripheral angiopathy without gangrene: Secondary | ICD-10-CM | POA: Diagnosis not present

## 2021-05-23 NOTE — Telephone Encounter (Signed)
Katelyn Kennedy called in and wanted to report an elevated heart rate for [name]. The heart rate was 112

## 2021-05-23 NOTE — Telephone Encounter (Signed)
Noted. This is new baseline for patient.

## 2021-05-26 DIAGNOSIS — E785 Hyperlipidemia, unspecified: Secondary | ICD-10-CM | POA: Diagnosis not present

## 2021-05-26 DIAGNOSIS — R41841 Cognitive communication deficit: Secondary | ICD-10-CM | POA: Diagnosis not present

## 2021-05-26 DIAGNOSIS — Z87891 Personal history of nicotine dependence: Secondary | ICD-10-CM | POA: Diagnosis not present

## 2021-05-26 DIAGNOSIS — C7931 Secondary malignant neoplasm of brain: Secondary | ICD-10-CM | POA: Diagnosis not present

## 2021-05-26 DIAGNOSIS — Z86718 Personal history of other venous thrombosis and embolism: Secondary | ICD-10-CM | POA: Diagnosis not present

## 2021-05-26 DIAGNOSIS — I10 Essential (primary) hypertension: Secondary | ICD-10-CM | POA: Diagnosis not present

## 2021-05-26 DIAGNOSIS — M1990 Unspecified osteoarthritis, unspecified site: Secondary | ICD-10-CM | POA: Diagnosis not present

## 2021-05-26 DIAGNOSIS — E1169 Type 2 diabetes mellitus with other specified complication: Secondary | ICD-10-CM | POA: Diagnosis not present

## 2021-05-26 DIAGNOSIS — W19XXXD Unspecified fall, subsequent encounter: Secondary | ICD-10-CM | POA: Diagnosis not present

## 2021-05-26 DIAGNOSIS — E119 Type 2 diabetes mellitus without complications: Secondary | ICD-10-CM | POA: Diagnosis not present

## 2021-05-26 DIAGNOSIS — C3431 Malignant neoplasm of lower lobe, right bronchus or lung: Secondary | ICD-10-CM | POA: Diagnosis not present

## 2021-05-26 DIAGNOSIS — S065X0D Traumatic subdural hemorrhage without loss of consciousness, subsequent encounter: Secondary | ICD-10-CM | POA: Diagnosis not present

## 2021-05-26 DIAGNOSIS — I951 Orthostatic hypotension: Secondary | ICD-10-CM | POA: Diagnosis not present

## 2021-05-26 DIAGNOSIS — E1151 Type 2 diabetes mellitus with diabetic peripheral angiopathy without gangrene: Secondary | ICD-10-CM | POA: Diagnosis not present

## 2021-05-27 ENCOUNTER — Telehealth: Payer: Self-pay

## 2021-05-27 ENCOUNTER — Telehealth: Payer: Self-pay | Admitting: Family Medicine

## 2021-05-27 DIAGNOSIS — C3431 Malignant neoplasm of lower lobe, right bronchus or lung: Secondary | ICD-10-CM | POA: Diagnosis not present

## 2021-05-27 DIAGNOSIS — Z87891 Personal history of nicotine dependence: Secondary | ICD-10-CM | POA: Diagnosis not present

## 2021-05-27 DIAGNOSIS — E1151 Type 2 diabetes mellitus with diabetic peripheral angiopathy without gangrene: Secondary | ICD-10-CM | POA: Diagnosis not present

## 2021-05-27 DIAGNOSIS — C7931 Secondary malignant neoplasm of brain: Secondary | ICD-10-CM | POA: Diagnosis not present

## 2021-05-27 DIAGNOSIS — E119 Type 2 diabetes mellitus without complications: Secondary | ICD-10-CM | POA: Diagnosis not present

## 2021-05-27 DIAGNOSIS — I10 Essential (primary) hypertension: Secondary | ICD-10-CM | POA: Diagnosis not present

## 2021-05-27 DIAGNOSIS — Z86718 Personal history of other venous thrombosis and embolism: Secondary | ICD-10-CM | POA: Diagnosis not present

## 2021-05-27 DIAGNOSIS — W19XXXD Unspecified fall, subsequent encounter: Secondary | ICD-10-CM | POA: Diagnosis not present

## 2021-05-27 DIAGNOSIS — E1169 Type 2 diabetes mellitus with other specified complication: Secondary | ICD-10-CM | POA: Diagnosis not present

## 2021-05-27 DIAGNOSIS — M1990 Unspecified osteoarthritis, unspecified site: Secondary | ICD-10-CM | POA: Diagnosis not present

## 2021-05-27 DIAGNOSIS — S065X0D Traumatic subdural hemorrhage without loss of consciousness, subsequent encounter: Secondary | ICD-10-CM | POA: Diagnosis not present

## 2021-05-27 DIAGNOSIS — E785 Hyperlipidemia, unspecified: Secondary | ICD-10-CM | POA: Diagnosis not present

## 2021-05-27 DIAGNOSIS — I951 Orthostatic hypotension: Secondary | ICD-10-CM | POA: Diagnosis not present

## 2021-05-27 DIAGNOSIS — R41841 Cognitive communication deficit: Secondary | ICD-10-CM | POA: Diagnosis not present

## 2021-05-27 NOTE — Telephone Encounter (Signed)
Call Katelyn Kennedy (Please document who Katelyn Kennedy is?, home health, family? Friend) This is essentially stable for Katelyn Kennedy.   Change recs: Notify MD only if HR elevated > 100 AND if having new symptoms of increase shortness of breath or chest pain or if HR > 120 without symptoms

## 2021-05-27 NOTE — Telephone Encounter (Signed)
Katelyn Kennedy with Katelyn Kennedy which was [place] notified as instructed by telephone and verbalized understanding.

## 2021-05-27 NOTE — Telephone Encounter (Signed)
[name]'s sister in law, Katelyn Kennedy, called our office to inform us that Katelyn Kennedy is getting weaker and weaker. She said that she is not eating much and is mainly confined to her bed or the couch. She said she is only urinating twice a day. Her daughter is currently in town but will be leaving on Thursday and [name] was interested in having a conversation with our team to establish the next steps in Katelyn Kennedy's care. Lexine Baton, NP notified and phone visit with Katelyn Kennedy and family scheduled for tomorrow at 11:30. Understanding verbalized. All questions answered.

## 2021-05-27 NOTE — Telephone Encounter (Signed)
Ms. Stanton Kidney called in wanted to report to [name] Ms. Parkers heart rate it was 112 today.

## 2021-05-28 ENCOUNTER — Inpatient Hospital Stay (HOSPITAL_BASED_OUTPATIENT_CLINIC_OR_DEPARTMENT_OTHER): Payer: PPO | Admitting: Nurse Practitioner

## 2021-05-28 ENCOUNTER — Telehealth: Payer: Self-pay

## 2021-05-28 ENCOUNTER — Encounter: Payer: Self-pay | Admitting: Nurse Practitioner

## 2021-05-28 DIAGNOSIS — G9389 Other specified disorders of brain: Secondary | ICD-10-CM | POA: Diagnosis not present

## 2021-05-28 DIAGNOSIS — M48061 Spinal stenosis, lumbar region without neurogenic claudication: Secondary | ICD-10-CM | POA: Diagnosis not present

## 2021-05-28 DIAGNOSIS — W19XXXA Unspecified fall, initial encounter: Secondary | ICD-10-CM | POA: Diagnosis not present

## 2021-05-28 DIAGNOSIS — R530 Neoplastic (malignant) related fatigue: Secondary | ICD-10-CM | POA: Diagnosis not present

## 2021-05-28 DIAGNOSIS — G8194 Hemiplegia, unspecified affecting left nondominant side: Secondary | ICD-10-CM | POA: Diagnosis present

## 2021-05-28 DIAGNOSIS — Z86718 Personal history of other venous thrombosis and embolism: Secondary | ICD-10-CM | POA: Diagnosis not present

## 2021-05-28 DIAGNOSIS — Z7189 Other specified counseling: Secondary | ICD-10-CM

## 2021-05-28 DIAGNOSIS — S065X0D Traumatic subdural hemorrhage without loss of consciousness, subsequent encounter: Secondary | ICD-10-CM | POA: Diagnosis not present

## 2021-05-28 DIAGNOSIS — Z515 Encounter for palliative care: Secondary | ICD-10-CM | POA: Diagnosis not present

## 2021-05-28 DIAGNOSIS — G936 Cerebral edema: Secondary | ICD-10-CM | POA: Diagnosis not present

## 2021-05-28 DIAGNOSIS — Z86711 Personal history of pulmonary embolism: Secondary | ICD-10-CM | POA: Diagnosis not present

## 2021-05-28 DIAGNOSIS — Z87891 Personal history of nicotine dependence: Secondary | ICD-10-CM | POA: Diagnosis not present

## 2021-05-28 DIAGNOSIS — Z9221 Personal history of antineoplastic chemotherapy: Secondary | ICD-10-CM | POA: Diagnosis not present

## 2021-05-28 DIAGNOSIS — Z85841 Personal history of malignant neoplasm of brain: Secondary | ICD-10-CM | POA: Diagnosis not present

## 2021-05-28 DIAGNOSIS — J309 Allergic rhinitis, unspecified: Secondary | ICD-10-CM | POA: Diagnosis present

## 2021-05-28 DIAGNOSIS — Z7901 Long term (current) use of anticoagulants: Secondary | ICD-10-CM | POA: Diagnosis not present

## 2021-05-28 DIAGNOSIS — R630 Anorexia: Secondary | ICD-10-CM

## 2021-05-28 DIAGNOSIS — E785 Hyperlipidemia, unspecified: Secondary | ICD-10-CM | POA: Diagnosis not present

## 2021-05-28 DIAGNOSIS — Z7401 Bed confinement status: Secondary | ICD-10-CM | POA: Diagnosis not present

## 2021-05-28 DIAGNOSIS — Z20822 Contact with and (suspected) exposure to covid-19: Secondary | ICD-10-CM | POA: Diagnosis present

## 2021-05-28 DIAGNOSIS — I824Y2 Acute embolism and thrombosis of unspecified deep veins of left proximal lower extremity: Secondary | ICD-10-CM | POA: Diagnosis not present

## 2021-05-28 DIAGNOSIS — M40204 Unspecified kyphosis, thoracic region: Secondary | ICD-10-CM | POA: Diagnosis not present

## 2021-05-28 DIAGNOSIS — Z85118 Personal history of other malignant neoplasm of bronchus and lung: Secondary | ICD-10-CM | POA: Diagnosis not present

## 2021-05-28 DIAGNOSIS — M5136 Other intervertebral disc degeneration, lumbar region: Secondary | ICD-10-CM | POA: Diagnosis not present

## 2021-05-28 DIAGNOSIS — C3490 Malignant neoplasm of unspecified part of unspecified bronchus or lung: Secondary | ICD-10-CM | POA: Diagnosis not present

## 2021-05-28 DIAGNOSIS — C3431 Malignant neoplasm of lower lobe, right bronchus or lung: Secondary | ICD-10-CM | POA: Diagnosis not present

## 2021-05-28 DIAGNOSIS — I951 Orthostatic hypotension: Secondary | ICD-10-CM | POA: Diagnosis not present

## 2021-05-28 DIAGNOSIS — C7931 Secondary malignant neoplasm of brain: Secondary | ICD-10-CM | POA: Diagnosis not present

## 2021-05-28 DIAGNOSIS — G9349 Other encephalopathy: Secondary | ICD-10-CM | POA: Diagnosis present

## 2021-05-28 DIAGNOSIS — R531 Weakness: Secondary | ICD-10-CM | POA: Diagnosis not present

## 2021-05-28 DIAGNOSIS — Z885 Allergy status to narcotic agent status: Secondary | ICD-10-CM | POA: Diagnosis not present

## 2021-05-28 DIAGNOSIS — R262 Difficulty in walking, not elsewhere classified: Secondary | ICD-10-CM | POA: Diagnosis not present

## 2021-05-28 DIAGNOSIS — Z923 Personal history of irradiation: Secondary | ICD-10-CM | POA: Diagnosis not present

## 2021-05-28 DIAGNOSIS — Z79899 Other long term (current) drug therapy: Secondary | ICD-10-CM | POA: Diagnosis not present

## 2021-05-28 DIAGNOSIS — M1990 Unspecified osteoarthritis, unspecified site: Secondary | ICD-10-CM | POA: Diagnosis not present

## 2021-05-28 DIAGNOSIS — I959 Hypotension, unspecified: Secondary | ICD-10-CM | POA: Diagnosis not present

## 2021-05-28 DIAGNOSIS — E119 Type 2 diabetes mellitus without complications: Secondary | ICD-10-CM | POA: Diagnosis not present

## 2021-05-28 DIAGNOSIS — R41841 Cognitive communication deficit: Secondary | ICD-10-CM | POA: Diagnosis not present

## 2021-05-28 DIAGNOSIS — N3289 Other specified disorders of bladder: Secondary | ICD-10-CM | POA: Diagnosis not present

## 2021-05-28 DIAGNOSIS — W19XXXD Unspecified fall, subsequent encounter: Secondary | ICD-10-CM | POA: Diagnosis not present

## 2021-05-28 DIAGNOSIS — E1151 Type 2 diabetes mellitus with diabetic peripheral angiopathy without gangrene: Secondary | ICD-10-CM | POA: Diagnosis present

## 2021-05-28 DIAGNOSIS — I10 Essential (primary) hypertension: Secondary | ICD-10-CM | POA: Diagnosis not present

## 2021-05-28 DIAGNOSIS — R29898 Other symptoms and signs involving the musculoskeletal system: Secondary | ICD-10-CM | POA: Diagnosis not present

## 2021-05-28 DIAGNOSIS — E1169 Type 2 diabetes mellitus with other specified complication: Secondary | ICD-10-CM | POA: Diagnosis not present

## 2021-05-28 DIAGNOSIS — Z66 Do not resuscitate: Secondary | ICD-10-CM | POA: Diagnosis present

## 2021-05-28 DIAGNOSIS — R609 Edema, unspecified: Secondary | ICD-10-CM | POA: Diagnosis not present

## 2021-05-28 NOTE — Telephone Encounter (Signed)
Ptts daughter, [name], called to advise that pt has gotten weaker. She states she is not eating much and is mainly confined to her bed or the couch. [name], pts daughter, is currently in town but will be leaving on Thursday and would like to have a conversation with our team to establish the next steps of pts care.  Pt is on observation with Dr. Julien Nordmann, therefore she would like to speak with Dr. Mickeal Skinner regarding pts brain cancer and sx. I advised I will share her message and concerns. [name] expressed understanding of this information and states she is best reached at [phone].

## 2021-05-28 NOTE — Progress Notes (Signed)
[place]  Telephone:(336) [phone] Fax:(336) [phone]   Name: Katelyn Kennedy Date: 05/28/2021 MRN: [id number]  DOB: 04/27/1945  Patient Care Team: Katelyn Sanders, MD as PCP - General Katelyn Kennedy, [facility] as Pharmacist (Pharmacist) Katelyn Pray, MD as Consulting Physician (Radiation Oncology) Pickenpack-Cousar, Katelyn Sax, NP as Nurse Practitioner (Nurse Practitioner)   I connected with Katelyn Kennedy on 05/28/21 at 11:30 AM EST by phone and verified that I am speaking with the correct person using two identifiers.   I discussed the limitations, risks, security and privacy concerns of performing an evaluation and management service by telemedicine and the availability of in-person appointments. I also discussed with the patient that there may be a patient responsible charge related to this service. The patient expressed understanding and agreed to proceed.   Other persons participating in the visit and their role in the encounter: Katelyn Kennedy (Sister-in-law) and daughter, Katelyn Kennedy.   Patients location: Home  Providers location: [place]    INTERVAL HISTORY: Katelyn Kennedy is a 76 y.o. 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  She was admitted on 03/05/2021 from home with worsening left-sided weakness. Discharged to SNF rehab. CT of head showed progression of metastatic lung cancer with multiple hemorrhagic lesions and vasogenic edema.  Patient followed by Katelyn. Sondra Kennedy ([place]) and Katelyn. Julien Kennedy. Recently completed whole brain radiation. Patient was recently admitted on 05/08/21 where she was found to have a submassive PE with right heart strain and new lung nodules. She was discharged on Eliquis s/p heparin drip.   SOCIAL HISTORY:     reports that she quit smoking about 4 years ago. Her smoking use  included cigarettes. She has a 20.00 pack-year smoking history. She has never used smokeless tobacco. She reports that she does not currently use alcohol. She reports that she does not use drugs.  ADVANCE DIRECTIVES:  Completed during recent hospitalization. Patient named her sister Katelyn Kennedy as her primary medical decision maker given her daughters both live out of state.  CODE STATUS: DNR  PAST MEDICAL HISTORY: Past Medical History:  Diagnosis Date   Allergic rhinitis    Arthritis    Diabetes mellitus without complication ([place])    diet control/no meds per pt   Dyspnea    Essential hypertension 04/11/2019   History of chicken pox    History of hiatal hernia    History of radiation therapy    right lung - 06/28/2017-08/11/2017  Katelyn Kennedy   History of radiation therapy    SRS brain - 11/29/2020-12/04/2020  Katelyn Katelyn Kennedy   History of radiation therapy    Whole brain - 03/10/2021-03/19/2021 Katelyn Kennedy   lung ca dx'd 04/2017   Lung cancer ([place])    Peripheral vascular disease ([place])    Pneumonia    PONV (postoperative nausea and vomiting)    Smoker     ALLERGIES:  is allergic to codeine.  MEDICATIONS:  Current Outpatient Medications  Medication Sig Dispense Refill   amitriptyline (ELAVIL) 50 MG tablet Take 1 tablet (50 mg total) by mouth at bedtime. 90 tablet 3   apixaban (ELIQUIS) 5 MG TABS tablet Take 1 tablet (5 mg total) by mouth 2 (two) times daily. 60 tablet 2   atorvastatin (LIPITOR) 40 MG tablet 1 tablet  po daily 90 tablet 3   Calcium Citrate-Vitamin D (  CITRACAL + D PO) Take 1 tablet by mouth daily. (Patient not taking: Reported on 05/15/2021)     cholecalciferol (VITAMIN D) 25 MCG (1000 UNIT) tablet Take 1,000 Units by mouth daily.     levETIRAcetam (KEPPRA) 500 MG tablet Take 1 tablet (500 mg total) by mouth 2 (two) times daily. 60 tablet 3   lisinopril (ZESTRIL) 2.5 MG tablet Take 2.5 mg by mouth daily.     Multiple Vitamin (MULTIVITAMIN) capsule  Take 1 capsule by mouth daily. Centrum Silver     polyethylene glycol (MIRALAX / GLYCOLAX) 17 g packet Take 17 g by mouth daily as needed for moderate constipation. 14 each 0   vitamin B-12 (CYANOCOBALAMIN) 1000 MCG tablet Take 1,000 mcg by mouth daily.     No current facility-administered medications for this visit.    VITAL SIGNS: There were no vitals taken for this visit. There were no vitals filed for this visit.  Estimated body mass index is 27.06 kg/m as calculated from the following:   Height as of 05/15/21: 4\' 11"  (1.499 m).   Weight as of 05/15/21: 134 lb (60.8 kg).   PERFORMANCE STATUS (ECOG) : 2 - Symptomatic, <50% confined to bed  IMPRESSION:  Visit was scheduled today at request of patient and her family. Katelyn Kennedy (sister-in-law) is Katelyn Kennedy's primary caregiver.  Her daughter Katelyn Kennedy is here visiting from [place] but is scheduled to return home tomorrow.  We discussed at length patient's current illness and disease trajectory.  They are concerned with her continued decline and no signs of improvement.  Family reports she is undergoing physical therapy twice weekly in the home however her participation has significantly decreased due to ongoing weakness.  Katelyn Kennedy shares that her therapist has spoken with them and made comments that patient is not progressing and is in fact seeming to be digressing.  Family states patient is sleeping more, extremely weak to the point she requires two-person assist to stand, and balance gait, at times can be walking or attempting to walk and legs will give out making her at high risk for falls, appetite has decreased.  I discussed at length concerns of patient's condition with emphasis on clarifying goals of care and expectations.  Daughter expresses she feels her physical therapy needs to be daily and that patient needs 24/7 home health to allow her every opportunity to improve.  I acknowledged her statement however also clarifying patient's disease progress and  possibility of no improvement.  We discussed patient's frequent hospitalizations and difficulty in "bouncing back" after each admission.  Family verbalized understanding expressing their awareness and observation.  Daughter becomes tearful expressing the difference in seeing her mother and the change in her condition specifically over the past 1 to 2 weeks since she has been visiting.  Kim expressed that she does not wish for her mother to suffer but also there are other options she would not want to disregard them.  She is asking about referral for daily physical therapy and home health.  Detailed education provided Ms. Mckinney was recently discharged home from recent hospitalization. Katelyn Kennedy shares since returning home patient continues to show signs of ongoing weakness.   We discussed at length her current illness and disease trajectory. Family acknowledges after each hospitalization patient has a difficult time "bouncing back!" They express some difficulty with caring for her when she is to weak to assist with care. Emotional support provided.   I empathetically approached the discussion regarding hospice and end-of-life care given patient's continued decline and  poor long-term prognosis.  Katelyn Kennedy and Katelyn Kennedy verbalized understanding expressing they are leaning towards a more comfort focused approach.  They asked appropriate questions.  Detailed education provided on the goals and philosophy of outpatient hospice support in the home versus residential hospice facility and criteria.  Family understands once goals are to focus solely on patient's comfort for what time she has left hospice would then be most appropriate.  They would like to continue with therapy and allow patient another week to see if they notice any changes.  They plan to sit down with her physical therapist later this week for more detailed discussion and gain there recommendations regarding patient's ability to improve.  Family is requesting  follow-up visit on next week to continue ongoing goals of care discussions.  Advised family to seek emergency assistance if patient further declined and goals were not yet established to focus on comfort care.  They are hopeful it will not Kennedy to this as patient's wishes are to pass away in her home.  Ms. ayerim berquist that if she is dying she would want to be at home amongst her family and friends and preferably not in the hospital.  Acknowledged her and family's request.  Family states if patient does not show further improvement they are leaning towards focusing on her comfort and hospice.  I discussed the importance of continued conversation with family and their medical providers regarding overall plan of care and treatment options, ensuring decisions are within the context of the patients values and GOCs.  PLAN: Ongoing goals of care discussions.  Aleda continues to show signs of ongoing decline and progression.  Detailed goals of care discussion with she and her family.  They would like to allow additional time for further family discussions and observation.  If patient continues to decline or shows no improvement they are leaning towards a more comfort focused approach of care with hospice support.  Advised family if patient condition significantly declines to contact our office to initiate hospice services or seek medical attention and goal is to continue to further work-up. I will plan to follow-up with for ongoing goals of care discussions in 1 week virtually.  Patient is unable to Kennedy in for face-to-face visit at this time.  Patient expressed understanding and was in agreement with this plan. She also understands that She can call the clinic at any time with any questions, concerns, or complaints.   Time Total: 50 min.   Visit consisted of counseling and education dealing with the complex and emotionally intense issues of symptom management and palliative care in the setting of  serious and potentially life-threatening illness.Greater than 50%  of this time was spent counseling and coordinating care related to the above assessment and plan.  Alda Lea, AGPCNP-BC  Palliative Medicine Team [phone]   Signed by: Alda Lea, AGPCNP-BC [place]

## 2021-05-29 ENCOUNTER — Telehealth: Payer: Self-pay | Admitting: *Deleted

## 2021-05-29 ENCOUNTER — Inpatient Hospital Stay: Payer: PPO | Admitting: Internal Medicine

## 2021-05-29 DIAGNOSIS — I10 Essential (primary) hypertension: Secondary | ICD-10-CM | POA: Diagnosis not present

## 2021-05-29 DIAGNOSIS — S065X0D Traumatic subdural hemorrhage without loss of consciousness, subsequent encounter: Secondary | ICD-10-CM | POA: Diagnosis not present

## 2021-05-29 DIAGNOSIS — E119 Type 2 diabetes mellitus without complications: Secondary | ICD-10-CM | POA: Diagnosis not present

## 2021-05-29 DIAGNOSIS — Z87891 Personal history of nicotine dependence: Secondary | ICD-10-CM | POA: Diagnosis not present

## 2021-05-29 DIAGNOSIS — W19XXXD Unspecified fall, subsequent encounter: Secondary | ICD-10-CM | POA: Diagnosis not present

## 2021-05-29 DIAGNOSIS — E1151 Type 2 diabetes mellitus with diabetic peripheral angiopathy without gangrene: Secondary | ICD-10-CM | POA: Diagnosis not present

## 2021-05-29 DIAGNOSIS — R41841 Cognitive communication deficit: Secondary | ICD-10-CM | POA: Diagnosis not present

## 2021-05-29 DIAGNOSIS — M1990 Unspecified osteoarthritis, unspecified site: Secondary | ICD-10-CM | POA: Diagnosis not present

## 2021-05-29 DIAGNOSIS — E1169 Type 2 diabetes mellitus with other specified complication: Secondary | ICD-10-CM | POA: Diagnosis not present

## 2021-05-29 DIAGNOSIS — C7931 Secondary malignant neoplasm of brain: Secondary | ICD-10-CM | POA: Diagnosis not present

## 2021-05-29 DIAGNOSIS — Z86718 Personal history of other venous thrombosis and embolism: Secondary | ICD-10-CM | POA: Diagnosis not present

## 2021-05-29 DIAGNOSIS — C3431 Malignant neoplasm of lower lobe, right bronchus or lung: Secondary | ICD-10-CM | POA: Diagnosis not present

## 2021-05-29 DIAGNOSIS — I951 Orthostatic hypotension: Secondary | ICD-10-CM | POA: Diagnosis not present

## 2021-05-29 DIAGNOSIS — E785 Hyperlipidemia, unspecified: Secondary | ICD-10-CM | POA: Diagnosis not present

## 2021-05-29 NOTE — Telephone Encounter (Signed)
Received call from [facility] with [place].  She advised that the patient was evaluated by her last week and she was able to ambulate around her apartment with minimal assistance and today upon evaluation she has declined a great deal.  State that the patient is total assist now.  Care giver [name] is unable to move her by herself.    On chart someone else called about her decline yesterday.  We attempted to get her scheduled for phone visit today but unable to so scheduled phone visit for tomorrow.    Kim at [facility] would like call back with any updates [phone]

## 2021-05-30 ENCOUNTER — Inpatient Hospital Stay: Payer: PPO | Admitting: Internal Medicine

## 2021-05-30 ENCOUNTER — Inpatient Hospital Stay (HOSPITAL_COMMUNITY)
Admission: EM | Admit: 2021-05-30 | Discharge: 2021-06-06 | DRG: 081 | Disposition: A | Discharge status: Home Health | Payer: PPO | Attending: Internal Medicine | Admitting: Internal Medicine

## 2021-05-30 ENCOUNTER — Encounter (HOSPITAL_COMMUNITY): Payer: Self-pay | Admitting: Oncology

## 2021-05-30 ENCOUNTER — Other Ambulatory Visit: Payer: Self-pay

## 2021-05-30 ENCOUNTER — Emergency Department (HOSPITAL_BASED_OUTPATIENT_CLINIC_OR_DEPARTMENT_OTHER)
Admit: 2021-05-30 | Discharge: 2021-05-30 | Disposition: A | Discharge status: Home / Self Care | Payer: PPO | Attending: Emergency Medicine | Admitting: Emergency Medicine

## 2021-05-30 ENCOUNTER — Emergency Department (HOSPITAL_COMMUNITY): Payer: PPO

## 2021-05-30 DIAGNOSIS — G8194 Hemiplegia, unspecified affecting left nondominant side: Secondary | ICD-10-CM | POA: Diagnosis present

## 2021-05-30 DIAGNOSIS — E1151 Type 2 diabetes mellitus with diabetic peripheral angiopathy without gangrene: Secondary | ICD-10-CM | POA: Diagnosis present

## 2021-05-30 DIAGNOSIS — Z515 Encounter for palliative care: Secondary | ICD-10-CM | POA: Diagnosis not present

## 2021-05-30 DIAGNOSIS — J309 Allergic rhinitis, unspecified: Secondary | ICD-10-CM | POA: Diagnosis present

## 2021-05-30 DIAGNOSIS — Z79899 Other long term (current) drug therapy: Secondary | ICD-10-CM

## 2021-05-30 DIAGNOSIS — I10 Essential (primary) hypertension: Secondary | ICD-10-CM | POA: Diagnosis present

## 2021-05-30 DIAGNOSIS — Z9221 Personal history of antineoplastic chemotherapy: Secondary | ICD-10-CM

## 2021-05-30 DIAGNOSIS — C3490 Malignant neoplasm of unspecified part of unspecified bronchus or lung: Secondary | ICD-10-CM | POA: Diagnosis not present

## 2021-05-30 DIAGNOSIS — I2699 Other pulmonary embolism without acute cor pulmonale: Secondary | ICD-10-CM

## 2021-05-30 DIAGNOSIS — I824Y2 Acute embolism and thrombosis of unspecified deep veins of left proximal lower extremity: Secondary | ICD-10-CM | POA: Diagnosis present

## 2021-05-30 DIAGNOSIS — Z66 Do not resuscitate: Secondary | ICD-10-CM | POA: Diagnosis not present

## 2021-05-30 DIAGNOSIS — R531 Weakness: Secondary | ICD-10-CM | POA: Diagnosis not present

## 2021-05-30 DIAGNOSIS — Z7189 Other specified counseling: Secondary | ICD-10-CM | POA: Diagnosis not present

## 2021-05-30 DIAGNOSIS — R609 Edema, unspecified: Secondary | ICD-10-CM

## 2021-05-30 DIAGNOSIS — R29898 Other symptoms and signs involving the musculoskeletal system: Secondary | ICD-10-CM

## 2021-05-30 DIAGNOSIS — C7931 Secondary malignant neoplasm of brain: Secondary | ICD-10-CM

## 2021-05-30 DIAGNOSIS — Z20822 Contact with and (suspected) exposure to covid-19: Secondary | ICD-10-CM | POA: Diagnosis present

## 2021-05-30 DIAGNOSIS — I82409 Acute embolism and thrombosis of unspecified deep veins of unspecified lower extremity: Secondary | ICD-10-CM

## 2021-05-30 DIAGNOSIS — G9349 Other encephalopathy: Secondary | ICD-10-CM | POA: Diagnosis present

## 2021-05-30 DIAGNOSIS — Z87891 Personal history of nicotine dependence: Secondary | ICD-10-CM

## 2021-05-30 DIAGNOSIS — G936 Cerebral edema: Principal | ICD-10-CM | POA: Diagnosis present

## 2021-05-30 DIAGNOSIS — Z85841 Personal history of malignant neoplasm of brain: Secondary | ICD-10-CM

## 2021-05-30 DIAGNOSIS — Z86718 Personal history of other venous thrombosis and embolism: Secondary | ICD-10-CM

## 2021-05-30 DIAGNOSIS — E119 Type 2 diabetes mellitus without complications: Secondary | ICD-10-CM

## 2021-05-30 DIAGNOSIS — Z7901 Long term (current) use of anticoagulants: Secondary | ICD-10-CM

## 2021-05-30 DIAGNOSIS — Z885 Allergy status to narcotic agent status: Secondary | ICD-10-CM

## 2021-05-30 DIAGNOSIS — Z85118 Personal history of other malignant neoplasm of bronchus and lung: Secondary | ICD-10-CM

## 2021-05-30 DIAGNOSIS — Z923 Personal history of irradiation: Secondary | ICD-10-CM

## 2021-05-30 DIAGNOSIS — Z86711 Personal history of pulmonary embolism: Secondary | ICD-10-CM

## 2021-05-30 LAB — CBC WITH DIFFERENTIAL/PLATELET
Abs Immature Granulocytes: 0.08 10*3/uL — ABNORMAL HIGH (ref 0.00–0.07)
Basophils Absolute: 0.1 10*3/uL (ref 0.0–0.1)
Basophils Relative: 1 %
Eosinophils Absolute: 0.1 10*3/uL (ref 0.0–0.5)
Eosinophils Relative: 1 %
HCT: 36.9 % (ref 36.0–46.0)
Hemoglobin: 11.7 g/dL — ABNORMAL LOW (ref 12.0–15.0)
Immature Granulocytes: 1 %
Lymphocytes Relative: 12 %
Lymphs Abs: 1.1 10*3/uL (ref 0.7–4.0)
MCH: 28.9 pg (ref 26.0–34.0)
MCHC: 31.7 g/dL (ref 30.0–36.0)
MCV: 91.1 fL (ref 80.0–100.0)
Monocytes Absolute: 0.7 10*3/uL (ref 0.1–1.0)
Monocytes Relative: 8 %
Neutro Abs: 7.6 10*3/uL (ref 1.7–7.7)
Neutrophils Relative %: 77 %
Platelets: 373 10*3/uL (ref 150–400)
RBC: 4.05 MIL/uL (ref 3.87–5.11)
RDW: 15.1 % (ref 11.5–15.5)
WBC: 9.7 10*3/uL (ref 4.0–10.5)
nRBC: 0 % (ref 0.0–0.2)

## 2021-05-30 LAB — COMPREHENSIVE METABOLIC PANEL
ALT: 24 U/L (ref 0–44)
AST: 25 U/L (ref 15–41)
Albumin: 3.4 g/dL — ABNORMAL LOW (ref 3.5–5.0)
Alkaline Phosphatase: 135 U/L — ABNORMAL HIGH (ref 38–126)
Anion gap: 7 (ref 5–15)
BUN: 12 mg/dL (ref 8–23)
CO2: 26 mmol/L (ref 22–32)
Calcium: 10 mg/dL (ref 8.9–10.3)
Chloride: 105 mmol/L (ref 98–111)
Creatinine, Ser: 0.77 mg/dL (ref 0.44–1.00)
GFR, Estimated: >60 mL/min (ref >60)
Glucose, Bld: 125 mg/dL — ABNORMAL HIGH (ref 70–99)
Potassium: 3.7 mmol/L (ref 3.5–5.1)
Sodium: 138 mmol/L (ref 135–145)
Total Bilirubin: 0.3 mg/dL (ref 0.3–1.2)
Total Protein: 7.9 g/dL (ref 6.5–8.1)

## 2021-05-30 LAB — CBG MONITORING, ED: Glucose-Capillary: 149 mg/dL — ABNORMAL HIGH (ref 70–99)

## 2021-05-30 LAB — PROTIME-INR
INR: 1.4 — ABNORMAL HIGH (ref 0.8–1.2)
Prothrombin Time: 17.6 seconds — ABNORMAL HIGH (ref 11.4–15.2)

## 2021-05-30 LAB — LACTIC ACID, PLASMA
Lactic Acid, Venous: 1 mmol/L (ref 0.5–1.9)
Lactic Acid, Venous: 0.9 mmol/L (ref 0.5–1.9)

## 2021-05-30 LAB — RESP PANEL BY RT-PCR (FLU A&B, COVID) ARPGX2
Influenza A by PCR: NEGATIVE
Influenza B by PCR: NEGATIVE
SARS Coronavirus 2 by RT PCR: NEGATIVE

## 2021-05-30 MED ORDER — GADOBUTROL 1 MMOL/ML IV SOLN
6.0000 mL | Freq: Once | INTRAVENOUS | Status: AC | PRN
  Administered 2021-05-30: 6 mL via INTRAVENOUS

## 2021-05-30 MED ORDER — ONDANSETRON HCL 4 MG PO TABS: 4.0000 mg | ORAL_TABLET | Freq: Four times a day (QID) | ORAL | Status: DC | PRN

## 2021-05-30 MED ORDER — ACETAMINOPHEN 325 MG PO TABS
650.0000 mg | ORAL_TABLET | Freq: Four times a day (QID) | ORAL | Status: DC | PRN
  Administered 2021-06-02: 650 mg via ORAL
  Filled 2021-05-30: qty 2

## 2021-05-30 MED ORDER — DEXAMETHASONE SODIUM PHOSPHATE 4 MG/ML IJ SOLN
4.0000 mg | Freq: Four times a day (QID) | INTRAMUSCULAR | Status: DC
  Administered 2021-05-30 – 2021-06-02 (×13): 4 mg via INTRAVENOUS
  Filled 2021-05-30 (×13): qty 1

## 2021-05-30 MED ORDER — ACETAMINOPHEN 650 MG RE SUPP: 650.0000 mg | Freq: Four times a day (QID) | RECTAL | Status: DC | PRN

## 2021-05-30 MED ORDER — ONDANSETRON HCL 4 MG/2ML IJ SOLN: 4.0000 mg | Freq: Four times a day (QID) | INTRAMUSCULAR | Status: DC | PRN

## 2021-05-30 MED ORDER — INSULIN ASPART 100 UNIT/ML IJ SOLN
0.0000 [IU] | Freq: Three times a day (TID) | INTRAMUSCULAR | Status: DC
  Administered 2021-05-31 (×2): 1 [IU] via SUBCUTANEOUS
  Administered 2021-06-01: 2 [IU] via SUBCUTANEOUS
  Administered 2021-06-01 – 2021-06-04 (×6): 1 [IU] via SUBCUTANEOUS
  Administered 2021-06-04: 2 [IU] via SUBCUTANEOUS
  Administered 2021-06-05: 1 [IU] via SUBCUTANEOUS
  Administered 2021-06-05: 2 [IU] via SUBCUTANEOUS
  Administered 2021-06-05 – 2021-06-06 (×2): 1 [IU] via SUBCUTANEOUS
  Administered 2021-06-06: 2 [IU] via SUBCUTANEOUS
  Filled 2021-05-30: qty 0.06

## 2021-05-30 MED ORDER — OXYCODONE HCL 5 MG PO TABS
5.0000 mg | ORAL_TABLET | ORAL | Status: DC | PRN
  Administered 2021-05-30 – 2021-06-06 (×2): 5 mg via ORAL
  Filled 2021-05-30 (×2): qty 1

## 2021-05-30 MED ORDER — APIXABAN 5 MG PO TABS
5.0000 mg | ORAL_TABLET | Freq: Two times a day (BID) | ORAL | Status: DC
  Administered 2021-05-30 – 2021-06-06 (×15): 5 mg via ORAL
  Filled 2021-05-30 (×15): qty 1

## 2021-05-30 NOTE — Progress Notes (Signed)
Left lower extremity venous duplex has been completed. Preliminary results can be found in CV Proc through chart review.  Results were given to Dr. Kathrynn Humble.  05/30/21 10:16 AM Katelyn Kennedy RVT

## 2021-05-30 NOTE — ED Notes (Signed)
Patient transported to MRI 

## 2021-05-30 NOTE — ED Provider Notes (Addendum)
[facility] DEPT Provider Note   CSN: [id number] Arrival date & time: 05/30/21  5102     History  Chief Complaint  Patient presents with   Weakness   Leg Pain    Katelyn Kennedy is a 76 y.o. female.  HPI    Patient comes in with chief complaint of generalized weakness and leg pain.  She is a 76 y.o. female medical history of lung cancer with brain metastasis (adenocarcinoma) history of chemoradiation in the past, craniotomy and resection of brain mass, peripheral vascular disease, diabetes diet-controlled, PE on anticoagulation.  Collateral history provided by patient's sister-in-law, who is also helping with her care.  According to the patient, she has been having increased generalized weakness over the last week.  She is also having increased left-sided weakness and pain in her left leg.  The pain is above her knee and is similar to her DVT pain.  According to the sister-in-law, patient normally able to get up with assistance, but now she is requiring significant support with any kind of movement because of her lower extremity weakness.  The weakness is worse than usual.  She does have known history of residual left-sided weakness from her brain metastases.  Home Medications Prior to Admission medications   Medication Sig Start Date End Date Taking? Authorizing Provider  amitriptyline (ELAVIL) 50 MG tablet Take 1 tablet (50 mg total) by mouth at bedtime. 10/15/20   Bedsole, Amy E, MD  apixaban (ELIQUIS) 5 MG TABS tablet Take 1 tablet (5 mg total) by mouth 2 (two) times daily. 05/12/21   Pokhrel, Corrie Mckusick, MD  atorvastatin (LIPITOR) 40 MG tablet 1 tablet  po daily 10/15/20   Bedsole, Amy E, MD  Calcium Citrate-Vitamin D (CITRACAL + D PO) Take 1 tablet by mouth daily. Patient not taking: Reported on 05/15/2021    [provider]  cholecalciferol (VITAMIN D) 25 MCG (1000 UNIT) tablet Take 1,000 Units by mouth daily.    [provider]   levETIRAcetam (KEPPRA) 500 MG tablet Take 1 tablet (500 mg total) by mouth 2 (two) times daily. 05/01/21   Ventura Sellers, MD  lisinopril (ZESTRIL) 2.5 MG tablet Take 2.5 mg by mouth daily. 04/01/21   [provider]  Multiple Vitamin (MULTIVITAMIN) capsule Take 1 capsule by mouth daily. Centrum Silver    [provider]  polyethylene glycol (MIRALAX / GLYCOLAX) 17 g packet Take 17 g by mouth daily as needed for moderate constipation. 03/18/21   Lavina Hamman, MD  vitamin B-12 (CYANOCOBALAMIN) 1000 MCG tablet Take 1,000 mcg by mouth daily.    [provider]      Allergies    Codeine    Review of Systems   Review of Systems  All other systems reviewed and are negative.  Physical Exam Updated Vital Signs BP 121/67    Pulse (!) 105    Temp 98.4 F (36.9 C) (Oral)    Resp 17    SpO2 95%  Physical Exam Vitals and nursing note reviewed.  Constitutional:      Appearance: She is well-developed.     Comments: Sleepy, but easily arousable  HENT:     Head: Atraumatic.  Eyes:     Extraocular Movements: Extraocular movements intact.     Pupils: Pupils are equal, round, and reactive to light.  Cardiovascular:     Rate and Rhythm: Normal rate.  Pulmonary:     Effort: Pulmonary effort is normal.  Musculoskeletal:  General: No swelling, tenderness or deformity.     Cervical back: Normal range of motion and neck supple.  Skin:    General: Skin is warm and dry.  Neurological:     Mental Status: She is oriented to person, place, and time.     Cranial Nerves: No cranial nerve deficit.     Comments: 4 out of 5 left lower extremity strength, 3 out of 5 left upper extremity strain, subjective numbness over the left lower extremity    ED Results / Procedures / Treatments   Labs (all labs ordered are listed, but only abnormal results are displayed) Labs Reviewed  COMPREHENSIVE METABOLIC PANEL - Abnormal; Notable for the following components:      Result  Value   Glucose, Bld 125 (*)    Albumin 3.4 (*)    Alkaline Phosphatase 135 (*)    All other components within normal limits  CBC WITH DIFFERENTIAL/PLATELET - Abnormal; Notable for the following components:   Hemoglobin 11.7 (*)    Abs Immature Granulocytes 0.08 (*)    All other components within normal limits  PROTIME-INR - Abnormal; Notable for the following components:   Prothrombin Time 17.6 (*)    INR 1.4 (*)    All other components within normal limits  CULTURE, BLOOD (ROUTINE X 2)  CULTURE, BLOOD (ROUTINE X 2)  RESP PANEL BY RT-PCR (FLU A&B, COVID) ARPGX2  LACTIC ACID, PLASMA  LACTIC ACID, PLASMA  URINALYSIS, ROUTINE W REFLEX MICROSCOPIC    EKG None  Radiology MR Brain W and Wo Contrast  Result Date: 05/30/2021 CLINICAL DATA:  Brain metastases suspected EXAM: MRI HEAD WITHOUT AND WITH CONTRAST TECHNIQUE: Multiplanar, multiecho pulse sequences of the brain and surrounding structures were obtained without and with intravenous contrast. CONTRAST:  16mL GADAVIST GADOBUTROL 1 MMOL/ML IV SOLN COMPARISON:  MRI March 06, 2021. FINDINGS: Brain: Redemonstrated small resection cavity along the high posterior right frontal lobe. Enhancement along the margins of the resection cavity is similar. No substantial change in extensive nodular enhancement throughout the right cerebral convexity, compatible with dural based metastatic disease. Some of the dural-based lesions are hemorrhagic with intrinsic T1 hyperintensity. Previously seen intra-axial tumor in the anterior right frontal lobe is decreased in size/conspicuity, probably in part from diminished/resolved hemorrhage. The extent of exuberant vasogenic edema is not substantially changed, greatest in the right anterior frontal lobe and extending across the genu of the corpus callosum. Approximally 1.3 cm of leftward midline shift anteriorly, unchanged. No evidence of acute large vascular territory infarct or hydrocephalus. Vascular: Major  arterial flow voids are maintained skull base. Skull and upper cervical spine: Right frontal craniotomy. No suspicious marrow signal. Sinuses/Orbits: Mild paranasal sinus mucosal thickening. Other: Moderate left and small right mastoid effusions. IMPRESSION: 1. No substantial change in extensive dural-based metastatic disease involving the right cerebral hemisphere with similar exuberant vasogenic edema and similar 1.3 cm of leftward midline shift anteriorly. 2. Decreased size/conspicuity of the previously seen right frontal intraparenchymal lesion, probably in part secondary to decreased/resolved associated hemorrhage. Electronically Signed   By: Margaretha Sheffield M.D.   On: 05/30/2021 12:07   VAS [name] LOWER EXTREMITY VENOUS (DVT) (7a-7p)  Result Date: 05/30/2021  Lower Venous DVT Study Patient Name:  Katelyn Kennedy  Date of Exam:   05/30/2021 Medical Rec #: [id number]        Accession #:    [id number] Date of Birth: 1945/11/30       Patient Gender: F Patient Age:   64 years Exam  Location:  [facility] Procedure:      VAS [name] LOWER EXTREMITY VENOUS (DVT) Referring Phys: Hayk Divis --------------------------------------------------------------------------------  Indications: Edema.  Risk Factors: Cancer. Limitations: Poor ultrasound/tissue interface. Comparison Study: 05/09/2021 - RIGHT:                   - There is no evidence of deep vein thrombosis in the lower                   extremity.                    - No cystic structure found in the popliteal fossa.                    LEFT:                   - Findings consistent with acute deep vein thrombosis                   involving the left                   popliteal vein, and left peroneal veins.                   - No cystic structure found in the popliteal fossa. Performing Technologist: Oliver Hum RVT  Examination Guidelines: A complete evaluation includes B-mode imaging, spectral Doppler, color Doppler, and power Doppler as needed of all  accessible portions of each vessel. Bilateral testing is considered an integral part of a complete examination. Limited examinations for reoccurring indications may be performed as noted. The reflux portion of the exam is performed with the patient in reverse Trendelenburg.  +-----+---------------+---------+-----------+----------+--------------+  RIGHT Compressibility Phasicity Spontaneity Properties Thrombus Aging  +-----+---------------+---------+-----------+----------+--------------+  CFV   Full            Yes       Yes                                    +-----+---------------+---------+-----------+----------+--------------+   +---------+---------------+---------+-----------+----------+--------------+  LEFT      Compressibility Phasicity Spontaneity Properties Thrombus Aging  +---------+---------------+---------+-----------+----------+--------------+  CFV       Full            Yes       Yes                                    +---------+---------------+---------+-----------+----------+--------------+  SFJ       Full                                                             +---------+---------------+---------+-----------+----------+--------------+  FV Prox   Full                                                             +---------+---------------+---------+-----------+----------+--------------+  FV Mid    Full                                                             +---------+---------------+---------+-----------+----------+--------------+  FV Distal Full                                                             +---------+---------------+---------+-----------+----------+--------------+  PFV       Full                                                             +---------+---------------+---------+-----------+----------+--------------+  POP       Partial         Yes       Yes                    Acute           +---------+---------------+---------+-----------+----------+--------------+  PTV       Full                                                              +---------+---------------+---------+-----------+----------+--------------+  PERO      Full                                                             +---------+---------------+---------+-----------+----------+--------------+     Summary: RIGHT: - No evidence of common femoral vein obstruction.  LEFT: - Findings consistent with acute deep vein thrombosis involving the left popliteal vein. - No cystic structure found in the popliteal fossa.  *See table(s) above for measurements and observations.    Preliminary     Procedures .Critical Care Performed by: Varney Biles, MD Authorized by: Varney Biles, MD   Critical care provider statement:    Critical care time (minutes):  42   Critical care was necessary to treat or prevent imminent or life-threatening deterioration of the following conditions:  CNS failure or compromise   Critical care was time spent personally by me on the following activities:  Development of treatment plan with patient or surrogate, discussions with consultants, evaluation of patient's response to treatment, examination of patient, ordering and review of laboratory studies, ordering and review of radiographic studies, ordering and performing treatments and interventions, pulse oximetry, re-evaluation of patient's condition and review of old charts    Medications Ordered in ED Medications  gadobutrol (GADAVIST) 1 MMOL/ML injection 6 mL (6 mLs Intravenous Contrast Given 05/30/21 1130)    ED Course/ Medical Decision Making/ A&P Clinical Course as of 05/30/21 1300  Fri May 30, 2021  1134 MRI from 11-22: IMPRESSION: 1. Marked progression of intracranial metastatic disease since 11/27/2020. This is largely dural spread of tumor over the right cerebral hemisphere with some intraparenchymal components, most notably in the anterior right frontal lobe where there is extensive vasogenic edema and 1.3 cm of leftward  midline shift. 2. Possible small volume subarachnoid  and subdural hemorrhage. [AN]  1255 I discussed the case with Dr. Mickeal Skinner, neurooncology. He recommends getting MRI brain with and without contrast.  A CT was ordered, but never completed.  Patient was taken for MRI -insert cancel the CT brain without contrast. [AN]  1257 MR Brain W and Wo Contrast MRI visualized.  No evidence of acute bleed noted.  I have reviewed radiologist interpretation, it appears that there is no new progression of metastatic disease either.  Discussed case with Dr. Mickeal Skinner.  As initially planned, we will proceed with admission.  Patient will need full PT, OT evaluation and possible evaluation for other causes for her symptoms.  Medicine to admit.  Her CODE STATUS is DNR, DNI.  I did have a conversation with the patient and her sister-in-law about possibly looking into hospice as an extra resource to improve quality of life. [AN]  1258 VAS [name] LOWER EXTREMITY VENOUS (DVT) (7a-7p) Ultrasound is negative for worsening DVT.  The existing DVT actually looks better/lower clot burden than previous [AN]    Clinical Course User Index [AN] Varney Biles, MD                           Medical Decision Making Amount and/or Complexity of Data Reviewed Labs: ordered. Radiology: ordered. Decision-making details documented in ED Course. ECG/medicine tests: ordered.  Risk Prescription drug management. Decision regarding hospitalization.  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  Patient also has history of PE - and is on Eliquis which further complicates the presenting complaint. The complaint involves an extensive differential diagnosis and treatment options and also carries with it a high risk of complications  and morbidity.    The differential diagnosis includes : New brain bleed, new mets to the brain, electrolyte abnormality, new ischemic stroke, for the leg pain, worsening DVT is in the differential.  There is no clinical findings consistent with cellulitis.  Given that she does not have bilateral involvement, spine/cord disease or concerns or lower.  The initial plan is to get basic labs, and discuss the case with neurooncology.   Final Clinical Impression(s) / ED Diagnoses Final diagnoses:  Left leg weakness    Rx / DC Orders ED Discharge Orders     None         Varney Biles, MD 05/30/21 [place], Anjela Cassara, MD 05/30/21 1303

## 2021-05-30 NOTE — Consult Note (Signed)
Palliative Care Consult Note                                  Date: 05/30/2021   Patient Name: Katelyn Kennedy  DOB: 27-Jun-1945  MRN: [id number]  Age / Sex: 76 y.o., female  PCP: [name], MD Referring Physician: Jonnie Finner, DO  Reason for Consultation: Establishing goals of care  HPI/Patient Profile: Palliative Care consult requested for goals of care discussion in this 76 y.o. 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   Patient followed by Dr. Sondra Come ([place]) and Dr. Julien Nordmann. Recently completed whole brain radiation. Patient was recently admitted on 05/08/21 where she was found to have a submassive PE with right heart strain and new lung nodules. She was discharged on Eliquis s/p heparin drip.  Admitted on 05/30/2021 from home with generalized weakness and leg pain. .   Past Medical History:  Diagnosis Date   Allergic rhinitis    Arthritis    Diabetes mellitus without complication ([place])    diet control/no meds per pt   Dyspnea    Essential hypertension 04/11/2019   History of chicken pox    History of hiatal hernia    History of radiation therapy    right lung - 06/28/2017-08/11/2017  Dr Kyung Rudd   History of radiation therapy    SRS brain - 11/29/2020-12/04/2020  Dr Jenny Reichmann [place]/Matthew Tammi Klippel   History of radiation therapy    Whole brain - 03/10/2021-03/19/2021 Dr Gery Pray   lung ca dx'd 04/2017   Lung cancer ([place])    Peripheral vascular disease ([place])    Pneumonia    PONV (postoperative nausea and vomiting)    Smoker      Subjective:   This NP Osborne Oman reviewed medical records, received report from team, assessed the patient and then spoke with patient's sister-in-law, [name] to discuss diagnosis, prognosis, [place], EOL wishes disposition and  options.  Ms. Gillum appears weak. Is alert to self and year. Some delayed response and confusion noted. States she is tired.   Patient is familiar to myself and palliative. I had lengthy discussion with family this week regarding goals of care and patient's ongoing signs of decline. Her home PT called sharing concerns with patient's increased weakness and significant inability to now assist with ADLs or ambulate. Family have been trying to care for her in the home with hopes of some improvement.   Daughter, [name] recently visited and returned back home to [place] on Thursday. She provided permission at that time to maintain contact with [name] or Vaughan Basta in her absence.   I created space and opportunity for family to explore state of health prior to admission, thoughts, and feelings.   We discussed Her current illness and what it means in the larger context of Her on-going co-morbidities. Natural disease trajectory and expectations were discussed.  [name] verbalized understanding of current illness and co-morbidities. She has been in discussion with patient's daughters regarding goals of care.   Although family was hopeful patient would show some signs of improvement, they are aware this may not happen. Expectations are clear to continue to treat the treatable. Family plans to continue ongoing goals of care discussions. Ms. Cadiente has declined significantly on this week with decreased appetite, some noticeable confusion,  weakness, inability to ambulate, and sleeping more.   I had an open and honest discussion with family expressing concerns for patient's ability to improve or for a meaningful recovery. Patient has been clear in the past she would not wish to suffer. She has also expressed wishes to spend what time she had left in the home when that time comes, however this most likely will not be feasible given health challenges of both [name] and Vaughan Basta.   I discussed the importance of continued  conversation with family and their medical providers regarding overall plan of care and treatment options, ensuring decisions are within the context of the patients values and GOCs.  Questions and concerns were addressed.  The family was encouraged to call with questions or concerns.  PMT will continue to support holistically as needed.  Objective:   Primary Diagnoses: Present on Admission:  Non-small cell lung cancer metastatic to brain ([place])   Scheduled Meds:  dexamethasone (DECADRON) injection  4 mg Intravenous Q6H    Continuous Infusions:   PRN Meds:   Allergies  Allergen Reactions   Codeine Nausea Only    Review of Systems  Constitutional:  Positive for appetite change.  Musculoskeletal:  Positive for arthralgias.  Neurological:  Positive for weakness.  Psychiatric/Behavioral:  Positive for confusion.   Unless otherwise noted, a complete review of systems is negative.  Physical Exam General: NAD, frail chronically-ill appearing Cardiovascular: regular rate and rhythm Pulmonary: diminished bilaterally  Abdomen: soft, nontender, + bowel sounds Extremities: no edema, no joint deformities Skin: no rashes, warm and dry Neurological: alert to self and year. Some confusion noted. Brief on due to incontinence.   Vital Signs:  BP 129/78    Pulse (!) 105    Temp 98.4 F (36.9 C) (Oral)    Resp 16    SpO2 95%  Pain Scale: 0-10   Pain Score: 0-No pain  SpO2: SpO2: 95 % O2 Device:SpO2: 95 % O2 Flow Rate: .   IO: Intake/output summary: No intake or output data in the 24 hours ending 05/30/21 1634  LBM:   Baseline Weight:   Most recent weight:        Palliative Assessment/Data: PPS 20-30%   Advanced Care Planning:   Primary Decision Maker: NEXT OF KIN  Code Status/Advance Care Planning: DNR  Family confirms DNR/DNI.   Hospice services outpatient were explained and offered. Patient and family verbalized understanding and awareness of hospice's goals and  philosophy of care. Family plans to continue with ongoing discussions. Education provided on hospice support in the home versus at hospice facility.   Assessment & Plan:   SUMMARY OF RECOMMENDATIONS   DNR/DNI-as confirmed by family Continue with current plan of care . Treat the treatable, no aggressive interventions. Family is remaining hopeful for some stability but also realistic in their understanding. Discussions regarding comfort focused care with hospice. They would like to continue ongoing discussions.  PMT will continue to support and follow as needed. Please call team line with urgent needs. I will be off service returning on Monday. Family is aware .   Symptom Management:  Per Attending  Palliative Prophylaxis:  Aspiration, Bowel Regimen, Delirium Protocol, Frequent Pain Assessment, and Turn Reposition  Additional Recommendations (Limitations, Scope, Preferences): Treat the treatable  Psycho-social/Spiritual:  Desire for further Chaplaincy support: no Additional Recommendations: Education on Hospice  Prognosis:  Poor   Discharge Planning:  To Be Determined    Patient's family expressed understanding and was in agreement with this plan.  Time Total: 45 min.   Visit consisted of counseling and education dealing with the complex and emotionally intense issues of symptom management and palliative care in the setting of serious and potentially life-threatening illness.Greater than 50%  of this time was spent counseling and coordinating care related to the above assessment and plan.  Signed by:  Alda Lea, AGPCNP-BC Palliative Medicine Team  Phone: [phone] Pager: [phone] Amion: Bjorn Pippin   Thank you for allowing the Palliative Medicine Team to assist in the care of this patient. Please utilize secure chat with additional questions, if there is no response within 30 minutes please call the above phone number. Palliative Medicine Team providers  are available by phone from 7am to 5pm daily and can be reached through the team cell phone.  Should this patient require assistance outside of these hours, please call the patient's attending physician.

## 2021-05-30 NOTE — Procedures (Signed)
History: 76 yo being evaluated for unlateral weakness  Sedation: none  Technique: This EEG was acquired with electrodes placed according to the International 10-20 electrode system (including Fp1, Fp2, F3, F4, C3, C4, P3, P4, O1, O2, T3, T4, T5, T6, A1, A2, Fz, Cz, Pz). The following electrodes were missing or displaced: none.   Background: There is a posterior dominant rhythm of 8-9 Hz which is seen bilaterally. There is diffuse irregular delta and theta activity seen throughout the study, but it is more prominent in the right frontal region, and faster frequencies are attenuated on the right.   Photic stimulation: Physiologic driving is not performed  EEG Abnormalities: none  Clinical Interpretation: This EEG is consistent with a focal right frontal cerebral dysfunciton in the seting of a generalized non-specific cerebral dysfunction(encephalopathy). There was no seizure or seizure predisposition recorded on this study. Please note that lack of epileptiform activity on EEG does not preclude the possibility of epilepsy.   Roland Rack, MD [facility] [phone]  If 7pm- 7am, please page neurology on call as listed in [place].

## 2021-05-30 NOTE — ED Provider Triage Note (Signed)
Emergency Medicine Provider Triage Evaluation Note  Katelyn Kennedy , a 76 y.o. female  was evaluated in triage.  Pt complains of leg pain.  Patient reported here she fell in July of last year and subsequently was evaluated in was diagnosed with having lung cancer.  Since the fall she has had pain to her left leg.  Was seen recently in the hospital a month ago for trouble breathing was found to have blood clots in her chest and her left leg and currently on Eliquis.  For the past week she is having more trouble with ambulation and more pain in her left leg and family member Felt the cannot care for her appropriate home thus brought her here.  No recent injury.  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  Appropriate orders placed.  Katelyn Kennedy was informed that the remainder of the evaluation will be completed by another provider, this initial triage assessment does not replace that evaluation, and the importance of remaining in the ED until their evaluation is complete.     Domenic Moras, PA-C 05/30/21 [phone]

## 2021-05-30 NOTE — Progress Notes (Signed)
EEG completed, results pending. 

## 2021-05-30 NOTE — ED Triage Notes (Signed)
Per EMS- Patient c/o weakness x 1 week. Patient c/o left leg pain since yesterday. Patient has a history of DVT in the left leg. Patient is unable to bear weight. Patient denies any falls.

## 2021-05-30 NOTE — H&P (Signed)
History and Physical    Patient: Katelyn Kennedy OFB:[id number] DOB: Aug 08, 1945 DOA: 05/30/2021 DOS: the patient was seen and examined on 05/30/2021 PCP: [name], MD  Patient coming from: Home  Chief Complaint:  Chief Complaint  Patient presents with   Weakness   Leg Pain    HPI: Katelyn Kennedy is a 76 y.o. 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 Presenting with worsening left side weakness and left thigh pain. Her family notes that she has become progressively more weak over these last couple of weeks. She is having difficulty performing her ADLs without assistance. It is has now become impossible to get her in and out of bed and to help her transfer. She woke her sister this morning with complaints of thigh pain this morning, so she decided to bring her to the ED for help. They deny any other aggravating or alleviating factors.    Review of Systems: As mentioned in the history of present illness. All other systems reviewed and are negative. Past Medical History:  Diagnosis Date   Allergic rhinitis    Arthritis    Diabetes mellitus without complication (HCC)    diet control/no meds per pt   Dyspnea    Essential hypertension 04/11/2019   History of chicken pox    History of hiatal hernia    History of radiation therapy    right lung - 06/28/2017-08/11/2017  Dr Kyung Rudd   History of radiation therapy    SRS brain - 11/29/2020-12/04/2020  Dr Jenny Reichmann Moody/Matthew Tammi Klippel   History of radiation therapy    Whole brain - 03/10/2021-03/19/2021 Dr Gery Pray   lung ca dx'd 04/2017   Lung cancer ([place])    Peripheral vascular disease ([place])    Pneumonia    PONV (postoperative nausea and vomiting)    Smoker    Past Surgical History:  Procedure Laterality Date   ANKLE FRACTURE SURGERY Left 10 years ago   "hard to wake up from anesthesia" per pt   APPLICATION OF CRANIAL NAVIGATION Right 11/07/2020   Procedure: APPLICATION  OF CRANIAL NAVIGATION;  Surgeon: Earnie Larsson, MD;  Location: [facility];  Service: Neurosurgery;  Laterality: Right;   Friend   BREAST BIOPSY Right 1978   BENIGN CYST   BREAST EXCISIONAL BIOPSY Left 2011   Benign   BREAST EXCISIONAL BIOPSY Right 1985   Benign    CATARACT EXTRACTION     CATARACT EXTRACTION W/ INTRAOCULAR LENS IMPLANT Bilateral    COLONOSCOPY  2011   CRANIOTOMY Right 11/07/2020   Procedure: Right Sided Craniotomy for Tumor;  Surgeon: Earnie Larsson, MD;  Location: [facility];  Service: Neurosurgery;  Laterality: Right;   ELECTROMAGNETIC NAVIGATION BROCHOSCOPY N/A 06/08/2017   Procedure: ELECTROMAGNETIC NAVIGATION BRONCHOSCOPY;  Surgeon: Flora Lipps, MD;  Location: [facility];  Service: Cardiopulmonary;  Laterality: N/A;   FRACTURE SURGERY     POLYPECTOMY  2011   TRANSURETHRAL RESECTION OF BLADDER TUMOR WITH MITOMYCIN-C N/A 05/04/2017   Procedure: TRANSURETHRAL RESECTION OF BLADDER TUMOR WITH gemcitabine;  Surgeon: Abbie Sons, MD;  Location: [facility];  Service: Urology;  Laterality: N/A;   TUBAL LIGATION     Social History:  reports that she quit smoking about 4 years ago. Her smoking use included cigarettes. She has a 20.00 pack-year smoking history. She has never used smokeless tobacco. She reports that she does not currently use alcohol. She reports that she does not  use drugs.  Allergies  Allergen Reactions   Codeine Nausea Only    Family History  Problem Relation Age of Onset   Diabetes Mother    Hypothyroidism Mother    Cancer Sister 61       BREAST   Breast cancer Sister    Hypothyroidism Sister    Cancer Maternal Grandmother        COLON   Colon cancer Maternal Grandmother 86   Stomach cancer Neg Hx     Prior to Admission medications   Medication Sig Start Date End Date Taking? Authorizing Provider  amitriptyline (ELAVIL) 50 MG tablet Take 1 tablet (50 mg total) by mouth at bedtime. 10/15/20   Bedsole, Amy E, MD  apixaban (ELIQUIS) 5 MG  TABS tablet Take 1 tablet (5 mg total) by mouth 2 (two) times daily. 05/12/21   Pokhrel, Corrie Mckusick, MD  atorvastatin (LIPITOR) 40 MG tablet 1 tablet  po daily 10/15/20   Bedsole, Amy E, MD  Calcium Citrate-Vitamin D (CITRACAL + D PO) Take 1 tablet by mouth daily. Patient not taking: Reported on 05/15/2021    [provider]  cholecalciferol (VITAMIN D) 25 MCG (1000 UNIT) tablet Take 1,000 Units by mouth daily.    [provider]  levETIRAcetam (KEPPRA) 500 MG tablet Take 1 tablet (500 mg total) by mouth 2 (two) times daily. 05/01/21   Ventura Sellers, MD  lisinopril (ZESTRIL) 2.5 MG tablet Take 2.5 mg by mouth daily. 04/01/21   [provider]  Multiple Vitamin (MULTIVITAMIN) capsule Take 1 capsule by mouth daily. Centrum Silver    [provider]  polyethylene glycol (MIRALAX / GLYCOLAX) 17 g packet Take 17 g by mouth daily as needed for moderate constipation. 03/18/21   Lavina Hamman, MD  vitamin B-12 (CYANOCOBALAMIN) 1000 MCG tablet Take 1,000 mcg by mouth daily.    [provider]    Physical Exam: Vitals:   05/30/21 0834 05/30/21 1000 05/30/21 1030 05/30/21 1205  BP: 125/71 (!) 110/59 106/65 121/67  Pulse: (!) 106 (!) 106 (!) 105 (!) 105  Resp: _0 Temp: 98.4 F (36.9 C)     TempSrc: Oral     SpO2: 91% 95% 93% 95%   General: 76 y.o. female resting in bed in NAD Eyes: PERRL, normal sclera ENMT: Nares patent w/o discharge, orophaynx clear, dentition normal, ears w/o discharge/lesions/ulcers Neck: Supple, trachea midline Cardiovascular: RRR, +S1, S2, no m/g/r, equal pulses throughout Respiratory: CTABL, no w/r/r, normal WOB GI: BS+, NDNT, no masses noted, no organomegaly noted MSK: No e/c/c Neuro: A&O x 3, LUE/LLE weakness Psyc: Appropriate interaction but flat affect, calm/cooperative   Data Reviewed:  Glucose 125 Alk phos 135 Hgb 11.7  MR brain: 1. No substantial change in extensive dural-based metastatic disease involving the  right cerebral hemisphere with similar exuberant vasogenic edema and similar 1.3 cm of leftward midline shift anteriorly. 2. Decreased size/conspicuity of the previously seen right frontal intraparenchymal lesion, probably in part secondary to decreased/resolved associated hemorrhage.  XR Pelvis: Normal except for chronic osteoarthritis of the hip joint and lower lumbar degenerative change. No acute or traumatic finding.  Assessment and Plan: No notes have been filed under this hospital service. 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 VMickeal Skinner  Family Communication: w/ sister at bedside  Severity of Illness: The appropriate patient status for this patient is OBSERVATION. Observation status is judged to be reasonable and necessary in order to provide the required intensity of service to ensure the patient's safety. The patient's presenting symptoms, physical exam findings, and initial radiographic and laboratory data in the context of their medical condition is felt to place them at decreased risk for further clinical deterioration. Furthermore, it is anticipated that the patient will be medically stable for discharge from the hospital within 2 midnights of  admission.   Author: TJonnie Finner DO 05/30/2021 1:29 PM  For on call review [email or web address]

## 2021-05-31 ENCOUNTER — Observation Stay (HOSPITAL_COMMUNITY): Payer: PPO

## 2021-05-31 DIAGNOSIS — I10 Essential (primary) hypertension: Secondary | ICD-10-CM

## 2021-05-31 DIAGNOSIS — Z86718 Personal history of other venous thrombosis and embolism: Secondary | ICD-10-CM | POA: Diagnosis not present

## 2021-05-31 DIAGNOSIS — R29898 Other symptoms and signs involving the musculoskeletal system: Secondary | ICD-10-CM | POA: Diagnosis present

## 2021-05-31 DIAGNOSIS — M48061 Spinal stenosis, lumbar region without neurogenic claudication: Secondary | ICD-10-CM | POA: Diagnosis not present

## 2021-05-31 DIAGNOSIS — Z9221 Personal history of antineoplastic chemotherapy: Secondary | ICD-10-CM | POA: Diagnosis not present

## 2021-05-31 DIAGNOSIS — Z7901 Long term (current) use of anticoagulants: Secondary | ICD-10-CM | POA: Diagnosis not present

## 2021-05-31 DIAGNOSIS — Z885 Allergy status to narcotic agent status: Secondary | ICD-10-CM | POA: Diagnosis not present

## 2021-05-31 DIAGNOSIS — Z66 Do not resuscitate: Secondary | ICD-10-CM | POA: Diagnosis present

## 2021-05-31 DIAGNOSIS — E1151 Type 2 diabetes mellitus with diabetic peripheral angiopathy without gangrene: Secondary | ICD-10-CM | POA: Diagnosis present

## 2021-05-31 DIAGNOSIS — I824Y2 Acute embolism and thrombosis of unspecified deep veins of left proximal lower extremity: Secondary | ICD-10-CM

## 2021-05-31 DIAGNOSIS — Z923 Personal history of irradiation: Secondary | ICD-10-CM | POA: Diagnosis not present

## 2021-05-31 DIAGNOSIS — Z79899 Other long term (current) drug therapy: Secondary | ICD-10-CM | POA: Diagnosis not present

## 2021-05-31 DIAGNOSIS — N3289 Other specified disorders of bladder: Secondary | ICD-10-CM | POA: Diagnosis not present

## 2021-05-31 DIAGNOSIS — R531 Weakness: Secondary | ICD-10-CM | POA: Diagnosis not present

## 2021-05-31 DIAGNOSIS — Z85841 Personal history of malignant neoplasm of brain: Secondary | ICD-10-CM | POA: Diagnosis not present

## 2021-05-31 DIAGNOSIS — C3490 Malignant neoplasm of unspecified part of unspecified bronchus or lung: Secondary | ICD-10-CM | POA: Diagnosis not present

## 2021-05-31 DIAGNOSIS — Z86711 Personal history of pulmonary embolism: Secondary | ICD-10-CM | POA: Diagnosis not present

## 2021-05-31 DIAGNOSIS — J309 Allergic rhinitis, unspecified: Secondary | ICD-10-CM | POA: Diagnosis present

## 2021-05-31 DIAGNOSIS — M5136 Other intervertebral disc degeneration, lumbar region: Secondary | ICD-10-CM | POA: Diagnosis not present

## 2021-05-31 DIAGNOSIS — Z87891 Personal history of nicotine dependence: Secondary | ICD-10-CM | POA: Diagnosis not present

## 2021-05-31 DIAGNOSIS — G8194 Hemiplegia, unspecified affecting left nondominant side: Secondary | ICD-10-CM | POA: Diagnosis present

## 2021-05-31 DIAGNOSIS — Z20822 Contact with and (suspected) exposure to covid-19: Secondary | ICD-10-CM | POA: Diagnosis present

## 2021-05-31 DIAGNOSIS — G9349 Other encephalopathy: Secondary | ICD-10-CM | POA: Diagnosis present

## 2021-05-31 DIAGNOSIS — Z85118 Personal history of other malignant neoplasm of bronchus and lung: Secondary | ICD-10-CM | POA: Diagnosis not present

## 2021-05-31 DIAGNOSIS — G936 Cerebral edema: Principal | ICD-10-CM

## 2021-05-31 DIAGNOSIS — M40204 Unspecified kyphosis, thoracic region: Secondary | ICD-10-CM | POA: Diagnosis not present

## 2021-05-31 DIAGNOSIS — E119 Type 2 diabetes mellitus without complications: Secondary | ICD-10-CM | POA: Diagnosis not present

## 2021-05-31 LAB — CBC
HCT: 35.8 % — ABNORMAL LOW (ref 36.0–46.0)
Hemoglobin: 11.3 g/dL — ABNORMAL LOW (ref 12.0–15.0)
MCH: 28.9 pg (ref 26.0–34.0)
MCHC: 31.6 g/dL (ref 30.0–36.0)
MCV: 91.6 fL (ref 80.0–100.0)
Platelets: 434 10*3/uL — ABNORMAL HIGH (ref 150–400)
RBC: 3.91 MIL/uL (ref 3.87–5.11)
RDW: 15 % (ref 11.5–15.5)
WBC: 6.9 10*3/uL (ref 4.0–10.5)
nRBC: 0 % (ref 0.0–0.2)

## 2021-05-31 LAB — COMPREHENSIVE METABOLIC PANEL
ALT: 23 U/L (ref 0–44)
AST: 27 U/L (ref 15–41)
Albumin: 3.1 g/dL — ABNORMAL LOW (ref 3.5–5.0)
Alkaline Phosphatase: 127 U/L — ABNORMAL HIGH (ref 38–126)
Anion gap: 10 (ref 5–15)
BUN: 19 mg/dL (ref 8–23)
CO2: 24 mmol/L (ref 22–32)
Calcium: 9.6 mg/dL (ref 8.9–10.3)
Chloride: 103 mmol/L (ref 98–111)
Creatinine, Ser: 0.59 mg/dL (ref 0.44–1.00)
GFR, Estimated: >60 mL/min (ref >60)
Glucose, Bld: 156 mg/dL — ABNORMAL HIGH (ref 70–99)
Potassium: 3.8 mmol/L (ref 3.5–5.1)
Sodium: 137 mmol/L (ref 135–145)
Total Bilirubin: 0.4 mg/dL (ref 0.3–1.2)
Total Protein: 7.5 g/dL (ref 6.5–8.1)

## 2021-05-31 LAB — GLUCOSE, CAPILLARY
Glucose-Capillary: 160 mg/dL — ABNORMAL HIGH (ref 70–99)
Glucose-Capillary: 133 mg/dL — ABNORMAL HIGH (ref 70–99)
Glucose-Capillary: 152 mg/dL — ABNORMAL HIGH (ref 70–99)
Glucose-Capillary: 160 mg/dL — ABNORMAL HIGH (ref 70–99)

## 2021-05-31 MED ORDER — LEVETIRACETAM 500 MG PO TABS
500.0000 mg | ORAL_TABLET | Freq: Two times a day (BID) | ORAL | Status: DC
  Administered 2021-05-31 – 2021-06-06 (×14): 500 mg via ORAL
  Filled 2021-05-31 (×14): qty 1

## 2021-05-31 MED ORDER — GADOBUTROL 1 MMOL/ML IV SOLN
6.0000 mL | Freq: Once | INTRAVENOUS | Status: AC | PRN
  Administered 2021-05-31: 6 mL via INTRAVENOUS

## 2021-05-31 NOTE — Assessment & Plan Note (Addendum)
-   Started on sliding scale insulin with NovoLog -CBG has been elevated due to steroids -CBG improved after starting Lantus 10 units subcu daily -glycemic trends overall stable albeit with occalsional values in the 200's -Would cont on discharge so long as pt is continued on steroids

## 2021-05-31 NOTE — Progress Notes (Signed)
[facility]  PROGRESS NOTE  Katelyn Kennedy XYI:[id number] DOB: [birth year] DOA: 05/30/2021 PCP: [name], MD  Brief hospital course  76 y.o. 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 Presenting with worsening left side weakness and left thigh pain. Her family notes that she has become progressively more weak over these last couple of weeks. She is having difficulty performing her ADLs without assistance. It is has now become impossible to get her in and out of bed and to help her transfer. She woke her sister this morning with complaints of thigh pain this morning, so she decided to bring her to the ED for help. They deny any other aggravating or alleviating factors.       Subjective   Patient seen and examined, left-sided weakness seems to be improving.  EEG obtained yesterday showed no seizure or seizure predisposition.     Assessment and Plan:  * Weakness of left side of body - Presented with left-sided hemiparesis -MRI brain showed no change in brain metastasis involving right cerebral hemisphere, vasogenic edema and similar 1.3 cm left foot midline shift anteriorly -Admitting physician spoke to neuro-oncology, total spine MRI screen was ordered along with Decadron -MRI of total spine is currently pending -Continue Decadron 4 mg IV every 6 hours  HTN (hypertension) - Blood pressure is stable -Continue lisinopril  Acute deep vein thrombosis (DVT) of proximal vein of left lower extremity (HCC)- (present on admission) History of DVT -Admitting provider spoke to neuro-oncology -Okay to continue Eliquis  Non-small cell lung cancer metastatic to brain [facility])- (present on admission) - Patient will follow-up with oncology as outpatient -Palliative care consulted; plan to continue current care  Vasogenic brain edema (HCC)- (present on admission) - Seen on MRI brain -Started on Decadron 4 mg IV every 6  hours  Diet-controlled type 2 diabetes mellitus ([place]) - Started on sliding scale insulin with NovoLog -CBG well controlled       Medications     apixaban  5 mg Oral BID   dexamethasone (DECADRON) injection  4 mg Intravenous Q6H   insulin aspart  0-6 Units Subcutaneous TID WC   levETIRAcetam  500 mg Oral BID     Data Reviewed:   CBG:  Recent Labs  Lab 05/30/21 2238 05/31/21 0823 05/31/21 1318  GLUCAP 149* 160* 133*    SpO2: 94 %    Vitals:   05/31/21 0037 05/31/21 0422 05/31/21 0824 05/31/21 1327  BP: 114/70 125/84 135/73 113/61  Pulse: (!) 110 (!) 108 (!) 112 (!) 108  Resp: 18 18 19    Temp: (!) 97.5 F (36.4 C) (!) 97.4 F (36.3 C) 98.2 F (36.8 C) (!) 97.5 F (36.4 C)  TempSrc: Oral Axillary Oral Oral  SpO2: 94% 98% 95% 94%  Weight:      Height:          Data Reviewed:  Basic Metabolic Panel: Recent Labs  Lab 05/30/21 0856 05/31/21 0936  NA 138 137  K 3.7 3.8  CL 105 103  CO2 26 24  GLUCOSE 125* 156*  BUN 12 19  CREATININE 0.77 0.59  CALCIUM 10.0 9.6    CBC: Recent Labs  Lab 05/30/21 0856 05/31/21 0936  WBC 9.7 6.9  NEUTROABS 7.6  --   HGB 11.7* 11.3*  HCT 36.9 35.8*  MCV 91.1 91.6  PLT 373 434*    LFT Recent Labs  Lab 05/30/21 0856 05/31/21 0936  AST 25 27  ALT 24 23  ALKPHOS 135* 127*  BILITOT 0.3 0.4  PROT 7.9 7.5  ALBUMIN 3.4* 3.1*     Micro : Blood cultures x2 obtained, result pending    Antibiotics: Anti-infectives (From admission, onward)    None          DVT prophylaxis: Apixaban  Code Status: DNR  Family Communication: No family at bedside  CONSULTS:    Objective    Physical Examination:   General-appears in no acute distress Heart-S1-S2, regular, no murmur auscultated Lungs-clear to auscultation bilaterally, no wheezing or crackles auscultated Abdomen-soft, nontender, no organomegaly Extremities-no edema in the lower extremities Neuro-alert, oriented x3, no focal deficit  noted, motor strength 4/5 in left upper extremity, 4/5 in left lower extremity  Status is: Inpatient: Left-sided weakness    Pressure Injury 03/06/21 Coccyx Medial Stage 1 -  Intact skin with non-blanchable redness of a localized area usually over a bony prominence. red area with small amount that does not blanch (Active)  03/06/21 2030  Location: Coccyx  Location Orientation: Medial  Staging: Stage 1 -  Intact skin with non-blanchable redness of a localized area usually over a bony prominence.  Wound Description (Comments): red area with small amount that does not blanch  Present on Admission: Yes          [place]   [facility] If 7PM-7AM, please contact night-coverage at [email or web address], Office  [phone]   05/31/2021, 4:26 PM  LOS: 0 days

## 2021-05-31 NOTE — Assessment & Plan Note (Addendum)
History of DVT -Admitting provider spoke to neuro-oncology -Okay to continue Eliquis -No evidence of acute blood loss

## 2021-05-31 NOTE — Assessment & Plan Note (Addendum)
-   Presented with left-sided hemiparesis -MRI brain showed no change in brain metastasis involving right cerebral hemisphere, vasogenic edema and similar 1.3 cm left foot midline shift anteriorly -Admitting physician spoke to neuro-oncology, total spine MRI screen was ordered along with Decadron -MRI of total spine showed no metastasis -He recommends discharge on Decadron 4 mg twice daily; they will arrange appointment as outpatient -IV Decadron discontinued, started on Decadron 4 mg p.o. twice daily -Insurance initially declined SNF stay. Family currently appealed denial, however that failed too. Plan d/c home with maximum [facility]

## 2021-05-31 NOTE — Assessment & Plan Note (Addendum)
-   Seen on MRI brain -Started on Decadron 4 mg IV every 6 hours initially this visit -Later changed to Decadron 4 mg p.o. twice daily

## 2021-05-31 NOTE — Assessment & Plan Note (Addendum)
-   Patient will follow-up with oncology as outpatient -Palliative care following

## 2021-05-31 NOTE — Assessment & Plan Note (Signed)
-   Blood pressure is stable -Continue lisinopril

## 2021-05-31 NOTE — Evaluation (Signed)
Physical Therapy Evaluation Patient Details Name: Katelyn Kennedy MRN: [id number] DOB: 07-15-1945 Today's Date: 05/31/2021  History of Present Illness  patient is a 76 year old female who presented to the hospital with weakness and leg pain. patient was noted to have had recent hosptialization with submassive PE with heart strain. patient was admitted with left sided weakness and HTN PMH: stage 4 NSCLS with brain mets, L side weakness, HTN, DM  Clinical Impression   Patient was noted to have decreased functional activity tolerance, decreased cardiopulmonary endurance, decreased standing balance, increased need for cues to participate in tasks. Patient's family not in room at this time but unclear on amount of assistance available at home at this time.  Patient would continue to benefit from skilled PT services at this time while admitted and after d/c to address noted deficits in order to improve overall safety and independence. Patients HR was noted to increase to 120s with transfer to recliner. Nurse made aware   Recommendations for follow up therapy are one component of a multi-disciplinary discharge planning process, led by the attending physician.  Recommendations may be updated based on patient status, additional functional criteria and insurance authorization.  Follow Up Recommendations Skilled nursing-short term rehab (<3 hours/day)    Assistance Recommended at Discharge Frequent or constant Supervision/Assistance  Patient can return home with the following  A little help with bathing/dressing/bathroom;Assistance with cooking/housework;Assist for transportation;Help with stairs or ramp for entrance;A lot of help with walking and/or transfers    Equipment Recommendations None recommended by PT  Recommendations for Other Services       Functional Status Assessment Patient has had a recent decline in their functional status and demonstrates the ability to make significant improvements  in function in a reasonable and predictable amount of time.     Precautions / Restrictions Precautions Precautions: Fall Precaution Comments: L side neglect Restrictions Weight Bearing Restrictions: No      Mobility  Bed Mobility Overal bed mobility: Needs Assistance Bed Mobility: Supine to Sit     Supine to sit: Mod assist, HOB elevated     General bed mobility comments: with increased time and cues for initation, sequencing and safety;  Physical assist to manage LEs, to bring trunk to upright and to complete rotation to EOB sitting with use of bed pad    Transfers Overall transfer level: Needs assistance Equipment used: Rolling walker (2 wheels) Transfers: Sit to/from Stand Sit to Stand: Mod assist, +2 physical assistance, +2 safety/equipment, From elevated surface           General transfer comment: cues for use of UEs to self assist with physcal assist to bring wt up and fwd and to balance in standing with noted posterior drift    Ambulation/Gait Ambulation/Gait assistance: Min assist, Mod assist, +2 physical assistance, +2 safety/equipment Gait Distance (Feet): 5 Feet Assistive device: Rolling walker (2 wheels) Gait Pattern/deviations: Step-to pattern, Decreased step length - left, Shuffle, Trunk flexed Gait velocity: decr     General Gait Details: cues for sequence, posture and position from [employer]            Wheelchair Mobility    Modified Rankin (Stroke Patients Only)       Balance Overall balance assessment: Needs assistance Sitting-balance support: Feet supported Sitting balance-Leahy Scale: Good     Standing balance support: Bilateral upper extremity supported, During functional activity, Reliant on assistive device for balance Standing balance-Leahy Scale: Poor Standing balance comment: posterior drift  Pertinent Vitals/Pain Pain Assessment Pain Assessment: No/denies pain    Home Living  Family/patient expects to be discharged to:: Private residence Living Arrangements: Alone Available Help at Discharge: Family;Friend(s);Available PRN/intermittently Type of Home: Apartment Home Access: Level entry       Home Layout: One level Home Equipment: Rollator (4 wheels);Shower seat;Hand held shower head;Grab bars - tub/shower;Grab bars - toilet Additional Comments: patient indicated that family and friends are able to physically assist at home. no family or friends here at this time.    Prior Function Prior Level of Function : Needs assist;History of Falls (last six months)  Cognitive Assist : ADLs (cognitive)   ADLs (Cognitive): Intermittent cues Physical Assist : Mobility (physical);ADLs (physical) Mobility (physical): Bed mobility;Transfers;Gait ADLs (physical): Bathing;Dressing;IADLs Mobility Comments: Rollator for gait for past 3-4 months since having a fall. ADLs Comments: Intermittent assist due to fatigue     Hand Dominance   Dominant Hand: Right    Extremity/Trunk Assessment   Upper Extremity Assessment Upper Extremity Assessment: LUE deficits/detail LUE Deficits / Details: noted to have weakness compared to R side. patient continues to have neglect of this side. patient able to AROM shoulder to about 80 degreed flexion.    Lower Extremity Assessment Lower Extremity Assessment: Generalized weakness;LLE deficits/detail LLE Deficits / Details: Generalized weakness bil but more notable on L with 4-/5 strength and with noted increased cueing for pt to focus on L LE    Cervical / Trunk Assessment Cervical / Trunk Assessment: Normal  Communication   Communication: No difficulties  Cognition Arousal/Alertness: Awake/alert Behavior During Therapy: WFL for tasks assessed/performed Overall Cognitive Status: Difficult to assess                                 General Comments: patient reported today was january 30th 2023. patient knew name and that  she was in hospital. patient needed step by step cues for sequencing of all tasks.        General Comments      Exercises     Assessment/Plan    PT Assessment Patient needs continued PT services  PT Problem List Decreased strength;Decreased activity tolerance;Decreased balance;Decreased mobility;Decreased knowledge of use of DME       PT Treatment Interventions DME instruction;Gait training;Functional mobility training;Therapeutic activities;Therapeutic exercise;Balance training;Patient/family education    PT Goals (Current goals can be found in the Care Plan section)  Acute Rehab PT Goals Patient Stated Goal: Regain IND PT Goal Formulation: With patient Time For Goal Achievement: 06/14/21 Potential to Achieve Goals: Fair    Frequency Min 3X/week     Co-evaluation PT/OT/SLP Co-Evaluation/Treatment: Yes Reason for Co-Treatment: For patient/therapist safety;To address functional/ADL transfers PT goals addressed during session: Mobility/safety with mobility OT goals addressed during session: ADL's and self-care       AM-PAC PT "6 Clicks" Mobility  Outcome Measure Help needed turning from your back to your side while in a flat bed without using bedrails?: A Lot Help needed moving from lying on your back to sitting on the side of a flat bed without using bedrails?: A Lot Help needed moving to and from a bed to a chair (including a wheelchair)?: A Lot Help needed standing up from a chair using your arms (e.g., wheelchair or bedside chair)?: A Lot Help needed to walk in hospital room?: Total Help needed climbing 3-5 steps with a railing? : Total 6 Click Score: 10    End of Session  Equipment Utilized During Treatment: Gait belt Activity Tolerance: Patient limited by fatigue Patient left: in chair;with call bell/phone within reach;with chair alarm set Nurse Communication: Mobility status PT Visit Diagnosis: Unsteadiness on feet (R26.81);Muscle weakness (generalized)  (M62.81);History of falling (Z91.81);Difficulty in walking, not elsewhere classified (R26.2)    Time: 1224-8250 PT Time Calculation (min) (ACUTE ONLY): 27 min   Charges:   PT Evaluation $PT Eval Low Complexity: 1 Low          [place] Pager [phone] Office [phone]   [facility] 05/31/2021, 5:36 PM

## 2021-05-31 NOTE — Hospital Course (Signed)
76 y.o. 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 Presenting with worsening left side weakness and left thigh pain. Her family notes that she has become progressively more weak over these last couple of weeks. She is having difficulty performing her ADLs without assistance. It is has now become impossible to get her in and out of bed and to help her transfer. She woke her sister this morning with complaints of thigh pain this morning, so she decided to bring her to the ED for help. They deny any other aggravating or alleviating factors.

## 2021-05-31 NOTE — Progress Notes (Signed)
OT Cancellation Note  Patient Details Name: USHA SLAGER MRN: [id number] DOB: 1945-10-15   Cancelled Treatment:    Reason Eval/Treat Not Completed: Patient at procedure or test/ unavailable Patient is off hall at MRI. OT to continue to follow and check back as schedule will allow. Jackelyn Poling OTR/L, [facility] Department Office# [phone] Pager# [phone]   05/31/2021, 11:37 AM

## 2021-05-31 NOTE — Evaluation (Addendum)
Occupational Therapy Evaluation Patient Details Name: Katelyn Kennedy MRN: [id number] DOB: Feb 26, 1946 Today's Date: 05/31/2021   History of Present Illness patient is a 76 year old female who presented to the hospital with weakness and leg pain. patient was noted to have had recent hosptialization with submassive PE with heart strain. patient was admitted with left sided weakness and HTN PMH: stage 4 NSCLS with brain mets, L side weakness, HTN, DM   Clinical Impression   Patient is a 76 year old female who was noted to have had a decline in ability to engage in ADLs. Patient was noted o have decreased functional activity tolerance, decreased cardiopulmonary endurance, decreased standing balance, increased need for cues to participate in tasks. Patient's family not in room at this time but unclear on amount of assistance available at home at this time.  Patient would continue to benefit from skilled OT services at this time while admitted and after d/c to address noted deficits in order to improve overall safety and independence in ADLs. Patients HR was noted to increase to 120s with transfer to recliner. Nurse made aware      Recommendations for follow up therapy are one component of a multi-disciplinary discharge planning process, led by the attending physician.  Recommendations may be updated based on patient status, additional functional criteria and insurance authorization.   Follow Up Recommendations  Skilled nursing-short term rehab (<3 hours/day)    Assistance Recommended at Discharge Frequent or constant Supervision/Assistance  Patient can return home with the following A lot of help with walking and/or transfers;A lot of help with bathing/dressing/bathroom;Assistance with cooking/housework;Direct supervision/assist for financial management;Assist for transportation;Direct supervision/assist for medications management;Help with stairs or ramp for entrance    Functional Status  Assessment     Equipment Recommendations  Other (comment) (RW)    Recommendations for Other Services       Precautions / Restrictions Precautions Precautions: Fall Precaution Comments: L side neglect Restrictions Weight Bearing Restrictions: No      Mobility Bed Mobility Overal bed mobility: Needs Assistance Bed Mobility: Supine to Sit     Supine to sit: Mod assist, HOB elevated     General bed mobility comments: with increased time and cues for initation, sequencing and safety    Transfers                          Balance Overall balance assessment: Needs assistance Sitting-balance support: Feet supported Sitting balance-Leahy Scale: Good     Standing balance support: Bilateral upper extremity supported, During functional activity, Reliant on assistive device for balance Standing balance-Leahy Scale: Poor                             ADL either performed or assessed with clinical judgement   ADL Overall ADL's : Needs assistance/impaired Eating/Feeding: Sitting;Set up   Grooming: Wash/dry face;Oral care;Minimal assistance;Sitting   Upper Body Bathing: Moderate assistance;Sitting   Lower Body Bathing: Bed level;Maximal assistance   Upper Body Dressing : Moderate assistance;Sitting   Lower Body Dressing: Bed level;Maximal assistance   Toilet Transfer: +2 for physical assistance;+2 for safety/equipment;Moderate assistance Toilet Transfer Details (indicate cue type and reason): with step by step cues for each advance. patient needed RW Toileting- Clothing Manipulation and Hygiene: Maximal assistance;Sit to/from stand       Functional mobility during ADLs: +2 for safety/equipment;+2 for physical assistance;Moderate assistance General ADL Comments: with increased time and  increased cues for seqeuncing, initation and safety with all tasks.     Vision Baseline Vision/History: 1 Wears glasses Additional Comments: patient reported having  had recent cataract surgery with increased blurriness in vision unless it was cleared. patient reported no blurriness on this date. patient was able to track finger across midline with no lagging     Perception     Praxis      Pertinent Vitals/Pain Pain Assessment Pain Assessment: No/denies pain     Hand Dominance Right   Extremity/Trunk Assessment Upper Extremity Assessment Upper Extremity Assessment: LUE deficits/detail LUE Deficits / Details: noted to have weakness compared to R side. patient continues to have neglect of this side. patient able to AROM shoulder to about 80 degreed flexion.   Lower Extremity Assessment Lower Extremity Assessment: Defer to PT evaluation   Cervical / Trunk Assessment Cervical / Trunk Assessment: Normal   Communication Communication Communication: No difficulties   Cognition Arousal/Alertness: Awake/alert Behavior During Therapy: WFL for tasks assessed/performed Overall Cognitive Status: Difficult to assess                                 General Comments: patient reported today was january 30th 2023. patient knew name and that she was in hospital. patient needed step by step cues for sequencing of all tasks.     General Comments       Exercises     Shoulder Instructions      Home Living Family/patient expects to be discharged to:: Private residence Living Arrangements: Alone Available Help at Discharge: Family;Friend(s);Available PRN/intermittently Type of Home: Apartment Home Access: Level entry     Home Layout: One level     Bathroom Shower/Tub: Teacher, early years/pre: Standard Bathroom Accessibility: Yes   Home Equipment: Rollator (4 wheels);Shower seat;Hand held shower head;Grab bars - tub/shower;Grab bars - toilet   Additional Comments: patient indicated that family and friends are able to physically assist at home. no family or friends here at this time.      Prior Functioning/Environment  Prior Level of Function : Needs assist;History of Falls (last six months)  Cognitive Assist : ADLs (cognitive)   ADLs (Cognitive): Intermittent cues Physical Assist : Mobility (physical);ADLs (physical) Mobility (physical): Bed mobility;Transfers;Gait ADLs (physical): Bathing;Dressing;IADLs Mobility Comments: Rollator for gait for past 3-4 months since having a fall. ADLs Comments: Intermittent assist due to fatigue        OT Problem List: Impaired balance (sitting and/or standing);Decreased safety awareness;Decreased cognition;Cardiopulmonary status limiting activity;Decreased knowledge of precautions;Decreased knowledge of use of DME or AE;Impaired UE functional use;Decreased activity tolerance      OT Treatment/Interventions: Self-care/ADL training;Therapeutic exercise;Neuromuscular education;Energy conservation;DME and/or AE instruction;Therapeutic activities;Balance training;Patient/family education    OT Goals(Current goals can be found in the care plan section) Acute Rehab OT Goals Patient Stated Goal: to get moving OT Goal Formulation: Patient unable to participate in goal setting Time For Goal Achievement: 06/14/21 Potential to Achieve Goals: Fair ADL Goals Pt Will Perform Lower Body Dressing: with supervision;sit to/from stand;sitting/lateral leans Pt Will Transfer to Toilet: with supervision;regular height toilet;ambulating Pt Will Perform Toileting - Clothing Manipulation and hygiene: with supervision;sit to/from stand;sitting/lateral leans Additional ADL Goal #1: Patient will stand at sink to perform grooming task as evidence of improving activity tolerance  OT Frequency: Min 2X/week    Co-evaluation PT/OT/SLP Co-Evaluation/Treatment: Yes Reason for Co-Treatment: For patient/therapist safety PT goals addressed during session: Mobility/safety with mobility OT  goals addressed during session: ADL's and self-care      AM-PAC OT "6 Clicks" Daily Activity     Outcome  Measure Help from another person eating meals?: A Little Help from another person taking care of personal grooming?: A Little Help from another person toileting, which includes using toliet, bedpan, or urinal?: Total Help from another person bathing (including washing, rinsing, drying)?: A Lot Help from another person to put on and taking off regular upper body clothing?: A Little Help from another person to put on and taking off regular lower body clothing?: A Lot 6 Click Score: 14   End of Session Equipment Utilized During Treatment: Gait belt;Rolling walker (2 wheels) Nurse Communication: Other (comment) (HR increase with activity)  Activity Tolerance: Patient tolerated treatment well Patient left: in chair;with call bell/phone within reach;with chair alarm set  OT Visit Diagnosis: Unsteadiness on feet (R26.81);Muscle weakness (generalized) (M62.81)                Time: 8677-3736 OT Time Calculation (min): 23 min Charges:  OT General Charges $OT Visit: 1 Visit OT Evaluation $OT Eval Moderate Complexity: 1 Mod  Jackelyn Poling OTR/L, [place] Acute Rehabilitation Department Office# [phone] Pager# [phone]   Marcellina Millin 05/31/2021, 5:03 PM

## 2021-06-01 DIAGNOSIS — E119 Type 2 diabetes mellitus without complications: Secondary | ICD-10-CM

## 2021-06-01 DIAGNOSIS — R531 Weakness: Secondary | ICD-10-CM | POA: Diagnosis not present

## 2021-06-01 DIAGNOSIS — G936 Cerebral edema: Secondary | ICD-10-CM | POA: Diagnosis not present

## 2021-06-01 DIAGNOSIS — I10 Essential (primary) hypertension: Secondary | ICD-10-CM | POA: Diagnosis not present

## 2021-06-01 LAB — GLUCOSE, CAPILLARY
Glucose-Capillary: 157 mg/dL — ABNORMAL HIGH (ref 70–99)
Glucose-Capillary: 222 mg/dL — ABNORMAL HIGH (ref 70–99)
Glucose-Capillary: 132 mg/dL — ABNORMAL HIGH (ref 70–99)
Glucose-Capillary: 289 mg/dL — ABNORMAL HIGH (ref 70–99)

## 2021-06-01 MED ORDER — INSULIN GLARGINE-YFGN 100 UNIT/ML ~~LOC~~ SOLN
10.0000 [IU] | Freq: Every day | SUBCUTANEOUS | Status: DC
  Administered 2021-06-01 – 2021-06-06 (×6): 10 [IU] via SUBCUTANEOUS
  Filled 2021-06-01 (×6): qty 0.1

## 2021-06-01 NOTE — NC FL2 (Deleted)
[place] LEVEL OF CARE SCREENING TOOL     IDENTIFICATION  Patient Name: Katelyn Kennedy Birthdate: 17-Feb-1946 Sex: female Admission Date (Current Location): 05/30/2021  [facility] and [place] Number:  Herbalist and Address:  [facility],  [place], [place]      Provider Number: [id number]  Attending Physician Name and Address:  Oswald Hillock, MD  Relative Name and Phone Number:  Sister Chucky May [phone] or # [phone])    Current Level of Care: Hospital Recommended Level of Care: [place] Prior Approval Number:    Date Approved/Denied:   PASRR Number: [id number]  Discharge Plan: SNF    Current Diagnoses: Patient Active Problem List   Diagnosis Date Noted   Left-sided weakness 05/31/2021   Weakness of left side of body 05/30/2021   DVT (deep venous thrombosis) ([place]) 05/30/2021   HTN (hypertension) 05/30/2021   Acute deep vein thrombosis (DVT) of proximal vein of left lower extremity ([place]) 05/15/2021   Brain metastasis ([place]) 05/15/2021   Pulmonary embolism ([place]) 05/08/2021   Acute pulmonary embolism ([place]) 05/08/2021   Orthostatic hypotension 04/01/2021   Fall 04/01/2021   Fall at home, initial encounter 03/31/2021   Pressure injury of skin 03/07/2021   Intraparenchymal hemorrhage of brain ([place]) 03/05/2021   Vasogenic brain edema ([place]) 03/05/2021   Midline shift of brain 03/05/2021   Non-small cell lung cancer metastatic to brain ([place]) 03/05/2021   History of lung cancer 10/15/2020   Atherosclerosis of aorta ([place]) 10/15/2020   Pain in lateral left lower extremity 04/30/2020   Multiple thyroid nodules 04/03/2019   History of bladder cancer 09/30/2018   Encounter for antineoplastic immunotherapy 09/13/2017   Chronic insomnia 08/17/2017   Encounter for antineoplastic chemotherapy 06/17/2017   Goals of care, counseling/discussion 06/17/2017   Primary malignant neoplasm of right  lower lobe of lung ([place]) 04/29/2017   Counseling regarding end of life decision making 07/31/2014   Eczema 07/20/2011   Right-sided chest wall pain 03/25/2010   MICROALBUMINURIA 03/25/2010   Osteoporosis 10/08/2009   Diet-controlled type 2 diabetes mellitus ([place]) 09/26/2009   Hyperlipidemia associated with type 2 diabetes mellitus ([place]) 09/03/2009   Allergic rhinitis 09/03/2009   ARTHRITIS 09/03/2009   CHICKENPOX, HX OF 09/03/2009    Orientation RESPIRATION BLADDER Height & Weight     Self  Normal Continent Weight: 128 lb 1.4 oz (58.1 kg) Height:  4\' 10"  (147.3 cm)  BEHAVIORAL SYMPTOMS/MOOD NEUROLOGICAL BOWEL NUTRITION STATUS      Continent Diet (Carb Modified)  AMBULATORY STATUS COMMUNICATION OF NEEDS Skin   Limited Assist Verbally Normal                       Personal Care Assistance Level of Assistance  Bathing, Dressing Bathing Assistance: Limited assistance   Dressing Assistance: Limited assistance Total Care Assistance: Limited assistance   Functional Limitations Info  Sight Sight Info: Impaired        SPECIAL CARE FACTORS FREQUENCY  PT (By licensed PT), OT (By licensed OT), Diabetic urine testing   Diabetic Urine Testing Frequency: - Started on sliding scale insulin with NovoLog  -CBG has been elevated due to steroids  -Start Lantus 10 units subcu daily PT Frequency: 5 x's per week OT Frequency: 5 x's per week            Contractures Contractures Info: Not present    Additional Factors Info  Code Status, Allergies Code Status Info:  DNR Allergies Info: Codeine           Current Medications (06/01/2021):  This is the current hospital active medication list Current Facility-Administered Medications  Medication Dose Route Frequency Provider Last Rate Last Admin   acetaminophen (TYLENOL) tablet 650 mg  650 mg Oral Q6H PRN Marylyn Ishihara, Tyrone A, DO       Or   acetaminophen (TYLENOL) suppository 650 mg  650 mg Rectal Q6H PRN Marylyn Ishihara, Tyrone A, DO        apixaban (ELIQUIS) tablet 5 mg  5 mg Oral BID Lovey Newcomer T, NP   5 mg at 06/01/21 0810   dexamethasone (DECADRON) injection 4 mg  4 mg Intravenous Q6H Kyle, Tyrone A, DO   4 mg at 06/01/21 1156   insulin aspart (novoLOG) injection 0-6 Units  0-6 Units Subcutaneous TID WC Kyle, Tyrone A, DO   2 Units at 06/01/21 1155   insulin glargine-yfgn (SEMGLEE) injection 10 Units  10 Units Subcutaneous Daily Oswald Hillock, MD   10 Units at 06/01/21 1330   levETIRAcetam (KEPPRA) tablet 500 mg  500 mg Oral BID Oswald Hillock, MD   500 mg at 06/01/21 0810   ondansetron (ZOFRAN) tablet 4 mg  4 mg Oral Q6H PRN Cherylann Ratel A, DO       Or   ondansetron (ZOFRAN) injection 4 mg  4 mg Intravenous Q6H PRN Marylyn Ishihara, Tyrone A, DO       oxyCODONE (Oxy IR/ROXICODONE) immediate release tablet 5 mg  5 mg Oral Q4H PRN Marylyn Ishihara, Tyrone A, DO   5 mg at 05/30/21 2348     Discharge Medications: Please see discharge summary for a list of discharge medications.  Relevant Imaging Results:  Relevant Lab Results:   Additional Information SS#: [id number]  Curlie Sittner, Marta Lamas, LCSW

## 2021-06-01 NOTE — TOC Progression Note (Signed)
Transition of Care [facility] [place] [facility]) - Progression Note    Patient Details  Name: Katelyn Kennedy MRN: [id number] Date of Birth: [birth year]  Transition of Care [facility]) CM/SW Contact  Bita Cartwright, Marta Lamas, [place] Phone Number: 06/01/2021, 3:55 PM  Clinical Narrative:     Patient and family are now requesting assistance with skilled nursing facility placement for rehabilitative services.  FL-2 Form has been completed and faxed to all skilled nursing facilities within a 50-mile radius.  Awaiting bed offers.  Expected Discharge Plan and Services   SNF for short-term rehabilitative services.   Social Determinants of Health (SDOH) Interventions    Readmission Risk Interventions Readmission Risk Prevention Plan 03/19/2021 11/08/2020  Post Dischage Appt - Complete  Medication Screening - Complete  Transportation Screening Complete Complete  PCP or Specialist Appt within 3-5 Days Complete -  HRI or Home Care Consult Complete -  Social Work Consult for [place] Planning/Counseling Complete -  Palliative Care Screening Not Applicable -  Medication Review Press [employer]) Complete -  Some recent data might be hidden

## 2021-06-01 NOTE — Plan of Care (Signed)
Katelyn Kennedy was intermittently confused throughout the shift, mostly regarding situation and time.  But she was easily reoriented. Her appetite is fair and she denies any pain. Her sister and niece visited for several hours in the afternoon.  Problem: Education: Goal: Knowledge of General Education information will improve Description: Including pain rating scale, medication(s)/side effects and non-pharmacologic comfort measures Outcome: Progressing

## 2021-06-01 NOTE — NC FL2 (Addendum)
[place] LEVEL OF CARE SCREENING TOOL     IDENTIFICATION  Patient Name: Katelyn Kennedy Birthdate: 1945-11-22 Sex: female Admission Date (Current Location): 05/30/2021  [facility] and [place] Number:  Herbalist and Address:  [facility],  [place], [place]      Provider Number: [id number]  Attending Physician Name and Address:  Oswald Hillock, MD  Relative Name and Phone Number:  Sister Chucky May [phone] or # [phone])    Current Level of Care: Hospital Recommended Level of Care: [place] Prior Approval Number:    Date Approved/Denied:   PASRR Number: [id number]  Discharge Plan: SNF    Current Diagnoses: Patient Active Problem List   Diagnosis Date Noted   Left-sided weakness 05/31/2021   Weakness of left side of body 05/30/2021   DVT (deep venous thrombosis) ([place]) 05/30/2021   HTN (hypertension) 05/30/2021   Acute deep vein thrombosis (DVT) of proximal vein of left lower extremity ([place]) 05/15/2021   Brain metastasis ([place]) 05/15/2021   Pulmonary embolism ([place]) 05/08/2021   Acute pulmonary embolism ([place]) 05/08/2021   Orthostatic hypotension 04/01/2021   Fall 04/01/2021   Fall at home, initial encounter 03/31/2021   Pressure injury of skin 03/07/2021   Intraparenchymal hemorrhage of brain ([place]) 03/05/2021   Vasogenic brain edema ([place]) 03/05/2021   Midline shift of brain 03/05/2021   Non-small cell lung cancer metastatic to brain ([place]) 03/05/2021   History of lung cancer 10/15/2020   Atherosclerosis of aorta ([place]) 10/15/2020   Pain in lateral left lower extremity 04/30/2020   Multiple thyroid nodules 04/03/2019   History of bladder cancer 09/30/2018   Encounter for antineoplastic immunotherapy 09/13/2017   Chronic insomnia 08/17/2017   Encounter for antineoplastic chemotherapy 06/17/2017   Goals of care, counseling/discussion 06/17/2017   Primary malignant neoplasm of right  lower lobe of lung ([place]) 04/29/2017   Counseling regarding end of life decision making 07/31/2014   Eczema 07/20/2011   Right-sided chest wall pain 03/25/2010   MICROALBUMINURIA 03/25/2010   Osteoporosis 10/08/2009   Diet-controlled type 2 diabetes mellitus ([place]) 09/26/2009   Hyperlipidemia associated with type 2 diabetes mellitus ([place]) 09/03/2009   Allergic rhinitis 09/03/2009   ARTHRITIS 09/03/2009   CHICKENPOX, HX OF 09/03/2009    Orientation RESPIRATION BLADDER Height & Weight     Self  Normal Continent Weight: 128 lb 1.4 oz (58.1 kg) Height:  4\' 10"  (147.3 cm)  BEHAVIORAL SYMPTOMS/MOOD NEUROLOGICAL BOWEL NUTRITION STATUS      Continent Diet (Carb Modified)  AMBULATORY STATUS COMMUNICATION OF NEEDS Skin   Limited Assist Verbally Normal                       Personal Care Assistance Level of Assistance  Bathing, Dressing Bathing Assistance: Limited assistance   Dressing Assistance: Limited assistance    Functional Limitations Info  Sight Sight Info: Impaired        SPECIAL CARE FACTORS FREQUENCY  PT (By licensed PT), OT (By licensed OT), Diabetic urine testing   Diabetic Urine Testing Frequency: - Started on sliding scale insulin with NovoLog  -CBG has been elevated due to steroids  -Start Lantus 10 units subcu daily PT Frequency: 5 x's per week OT Frequency: 5 x's per week            Contractures Contractures Info: Not present    Additional Factors Info  Code Status, Allergies Code Status Info: DNR Allergies Info: Codeine  Current Medications (06/01/2021):  This is the current hospital active medication list Current Facility-Administered Medications  Medication Dose Route Frequency Provider Last Rate Last Admin   acetaminophen (TYLENOL) tablet 650 mg  650 mg Oral Q6H PRN Marylyn Ishihara, Tyrone A, DO       Or   acetaminophen (TYLENOL) suppository 650 mg  650 mg Rectal Q6H PRN Marylyn Ishihara, Tyrone A, DO       apixaban (ELIQUIS) tablet 5 mg  5 mg Oral BID  Lovey Newcomer T, NP   5 mg at 06/01/21 0810   dexamethasone (DECADRON) injection 4 mg  4 mg Intravenous Q6H Kyle, Tyrone A, DO   4 mg at 06/01/21 1156   insulin aspart (novoLOG) injection 0-6 Units  0-6 Units Subcutaneous TID WC Kyle, Tyrone A, DO   2 Units at 06/01/21 1155   insulin glargine-yfgn (SEMGLEE) injection 10 Units  10 Units Subcutaneous Daily Oswald Hillock, MD   10 Units at 06/01/21 1330   levETIRAcetam (KEPPRA) tablet 500 mg  500 mg Oral BID Oswald Hillock, MD   500 mg at 06/01/21 0810   ondansetron (ZOFRAN) tablet 4 mg  4 mg Oral Q6H PRN Cherylann Ratel A, DO       Or   ondansetron (ZOFRAN) injection 4 mg  4 mg Intravenous Q6H PRN Marylyn Ishihara, Tyrone A, DO       oxyCODONE (Oxy IR/ROXICODONE) immediate release tablet 5 mg  5 mg Oral Q4H PRN Marylyn Ishihara, Tyrone A, DO   5 mg at 05/30/21 2348     Discharge Medications: Please see discharge summary for a list of discharge medications.  Relevant Imaging Results:  Relevant Lab Results:   Additional Information SS#: [id number]  [place]mo [place], Marta Lamas, LCSW

## 2021-06-01 NOTE — Progress Notes (Signed)
[facility]  PROGRESS NOTE  Katelyn Kennedy NWG:[id number] DOB: 09-01-1945 DOA: 05/30/2021 PCP: [name], MD  Brief hospital course  76 y.o. 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 Presenting with worsening left side weakness and left thigh pain. Her family notes that she has become progressively more weak over these last couple of weeks. She is having difficulty performing her ADLs without assistance. It is has now become impossible to get her in and out of bed and to help her transfer. She woke her sister this morning with complaints of thigh pain this morning, so she decided to bring her to the ED for help. They deny any other aggravating or alleviating factors.       Subjective   Patient seen and examined, denies any complaints.  Awaiting MRI of the spine.  Left-sided weakness is improving.     Assessment and Plan:  * Weakness of left side of body - Presented with left-sided hemiparesis -MRI brain showed no change in brain metastasis involving right cerebral hemisphere, vasogenic edema and similar 1.3 cm left foot midline shift anteriorly -Admitting physician spoke to neuro-oncology, total spine MRI screen was ordered along with Decadron -MRI of total spine is currently pending -Continue Decadron 4 mg IV every 6 hours  HTN (hypertension) - Blood pressure is stable -Continue lisinopril  Acute deep vein thrombosis (DVT) of proximal vein of left lower extremity (HCC)- (present on admission) History of DVT -Admitting provider spoke to neuro-oncology -Okay to continue Eliquis  Non-small cell lung cancer metastatic to brain [facility])- (present on admission) - Patient will follow-up with oncology as outpatient -Palliative care consulted; plan to continue current care  Vasogenic brain edema (HCC)- (present on admission) - Seen on MRI brain -Started on Decadron 4 mg IV every 6 hours  Diet-controlled type 2  diabetes mellitus ([place]) - Started on sliding scale insulin with NovoLog -CBG has been elevated due to steroids -Start Lantus 10 units subcu daily       Medications     apixaban  5 mg Oral BID   dexamethasone (DECADRON) injection  4 mg Intravenous Q6H   insulin aspart  0-6 Units Subcutaneous TID WC   insulin glargine-yfgn  10 Units Subcutaneous Daily   levETIRAcetam  500 mg Oral BID     Data Reviewed:   CBG:  Recent Labs  Lab 05/31/21 1318 05/31/21 1737 05/31/21 2107 06/01/21 0755 06/01/21 1138  GLUCAP 133* 152* 160* 157* 222*    SpO2: 93 %    Vitals:   05/31/21 0824 05/31/21 1327 05/31/21 2105 06/01/21 0449  BP: 135/73 113/61 115/70 120/78  Pulse: (!) 112 (!) 108 (!) 109 (!) 105  Resp: 19  18 18   Temp: 98.2 F (36.8 C) (!) 97.5 F (36.4 C) 97.8 F (36.6 C) 97.6 F (36.4 C)  TempSrc: Oral Oral Oral   SpO2: 95% 94% 96% 93%  Weight:      Height:          Data Reviewed:  Basic Metabolic Panel: Recent Labs  Lab 05/30/21 0856 05/31/21 0936  NA 138 137  K 3.7 3.8  CL 105 103  CO2 26 24  GLUCOSE 125* 156*  BUN 12 19  CREATININE 0.77 0.59  CALCIUM 10.0 9.6    CBC: Recent Labs  Lab 05/30/21 0856 05/31/21 0936  WBC 9.7 6.9  NEUTROABS 7.6  --   HGB 11.7* 11.3*  HCT 36.9 35.8*  MCV 91.1  91.6  PLT 373 434*    LFT Recent Labs  Lab 05/30/21 0856 05/31/21 0936  AST 25 27  ALT 24 23  ALKPHOS 135* 127*  BILITOT 0.3 0.4  PROT 7.9 7.5  ALBUMIN 3.4* 3.1*     Micro : Blood cultures x2 obtained, result pending    Antibiotics: Anti-infectives (From admission, onward)    None          DVT prophylaxis: Apixaban  Code Status: DNR  Family Communication: No family at bedside  CONSULTS:    Objective    Physical Examination:   General-appears in no acute distress Heart-S1-S2, regular, no murmur auscultated Lungs-clear to auscultation bilaterally, no wheezing or crackles auscultated Abdomen-soft, nontender, no  organomegaly Extremities-no edema in the lower extremities Neuro-alert, oriented x3, no focal deficit noted, motor strength 5/5 in both left upper and lower extremity  Status is: Inpatient: Left-sided weakness    Pressure Injury 03/06/21 Coccyx Medial Stage 1 -  Intact skin with non-blanchable redness of a localized area usually over a bony prominence. red area with small amount that does not blanch (Active)  03/06/21 2030  Location: Coccyx  Location Orientation: Medial  Staging: Stage 1 -  Intact skin with non-blanchable redness of a localized area usually over a bony prominence.  Wound Description (Comments): red area with small amount that does not blanch  Present on Admission: Yes          [place]   [facility] If 7PM-7AM, please contact night-coverage at [email or web address], Office  [phone]   06/01/2021, 12:01 PM  LOS: 1 day

## 2021-06-02 DIAGNOSIS — E119 Type 2 diabetes mellitus without complications: Secondary | ICD-10-CM | POA: Diagnosis not present

## 2021-06-02 DIAGNOSIS — Z66 Do not resuscitate: Secondary | ICD-10-CM | POA: Diagnosis not present

## 2021-06-02 DIAGNOSIS — G936 Cerebral edema: Secondary | ICD-10-CM | POA: Diagnosis not present

## 2021-06-02 DIAGNOSIS — I10 Essential (primary) hypertension: Secondary | ICD-10-CM | POA: Diagnosis not present

## 2021-06-02 DIAGNOSIS — R531 Weakness: Secondary | ICD-10-CM | POA: Diagnosis not present

## 2021-06-02 DIAGNOSIS — C3490 Malignant neoplasm of unspecified part of unspecified bronchus or lung: Secondary | ICD-10-CM | POA: Diagnosis not present

## 2021-06-02 DIAGNOSIS — I824Y2 Acute embolism and thrombosis of unspecified deep veins of left proximal lower extremity: Secondary | ICD-10-CM | POA: Diagnosis not present

## 2021-06-02 LAB — GLUCOSE, CAPILLARY
Glucose-Capillary: 158 mg/dL — ABNORMAL HIGH (ref 70–99)
Glucose-Capillary: 150 mg/dL — ABNORMAL HIGH (ref 70–99)
Glucose-Capillary: 187 mg/dL — ABNORMAL HIGH (ref 70–99)
Glucose-Capillary: 201 mg/dL — ABNORMAL HIGH (ref 70–99)

## 2021-06-02 MED ORDER — DEXAMETHASONE 4 MG PO TABS
4.0000 mg | ORAL_TABLET | Freq: Two times a day (BID) | ORAL | Status: DC
  Administered 2021-06-03 – 2021-06-06 (×8): 4 mg via ORAL
  Filled 2021-06-02 (×8): qty 1

## 2021-06-02 MED ORDER — AMITRIPTYLINE HCL 25 MG PO TABS
50.0000 mg | ORAL_TABLET | Freq: Every day | ORAL | Status: DC
Start: 2021-06-02 — End: 2021-06-07
  Administered 2021-06-02 – 2021-06-06 (×5): 50 mg via ORAL
  Filled 2021-06-02 (×6): qty 2

## 2021-06-02 MED ORDER — LISINOPRIL 5 MG PO TABS
2.5000 mg | ORAL_TABLET | Freq: Every day | ORAL | Status: DC
Start: 2021-06-02 — End: 2021-06-07
  Administered 2021-06-02 – 2021-06-06 (×5): 2.5 mg via ORAL
  Filled 2021-06-02 (×5): qty 1

## 2021-06-02 MED ORDER — ATORVASTATIN CALCIUM 40 MG PO TABS
40.0000 mg | ORAL_TABLET | Freq: Every evening | ORAL | Status: DC
Start: 2021-06-02 — End: 2021-06-07
  Administered 2021-06-02 – 2021-06-06 (×5): 40 mg via ORAL
  Filled 2021-06-02 (×5): qty 1

## 2021-06-02 NOTE — TOC Progression Note (Addendum)
Transition of Care [facility]) - Progression Note    Patient Details  Name: Katelyn Kennedy MRN: [id number] Date of Birth: Apr 01, 1946  Transition of Care [facility]) CM/SW Contact  Charan Prieto, Juliann Pulse, RN Phone Number: 06/02/2021, 10:51 AM  Clinical Narrative: Faxed out awaiting bed offers.  -4p-[facility]-rep [place]-accepted;initiated [facility] Kaufmann for [facility], & PTAR-await outcome.          Expected Discharge Plan and Services                                                 Social Determinants of Health (SDOH) Interventions    Readmission Risk Interventions Readmission Risk Prevention Plan 03/19/2021 11/08/2020  Post Dischage Appt - Complete  Medication Screening - Complete  Transportation Screening Complete Complete  PCP or Specialist Appt within 3-5 Days Complete -  HRI or Home Care Consult Complete -  Social Work Consult for [place] Planning/Counseling Complete -  Palliative Care Screening Not Applicable -  Medication Review Press photographer) Complete -  Some recent data might be hidden

## 2021-06-02 NOTE — Progress Notes (Signed)
° °  Palliative Medicine Inpatient Follow Up Note     Chart Reviewed. Patient assessed at the bedside.   Katelyn Kennedy is sitting up in bed drinking coffee. She appears much better today compared to day of admission. Alert and oriented to conversations. Her sisters-in-law Katelyn Kennedy and Katelyn Kennedy present.   They express concerns with patient's disposition. Education provided on SNF referral process and timing given information was completed over the weekend. Family verbalized understanding.   Patient and family expressed goals to continue to treat the treatable. They are appreciative of observed improvement.   I created space to discuss long-term prognosis and expectations. Although patient is improved since admission concerns remain regarding how patient will sustain. Family verbalized understanding and remain realistic in their understanding but also hopeful.  Discussed the importance of continued conversation with family and their  medical providers regarding overall plan of care and treatment options, ensuring decisions are within the context of the patients values and GOCs.   Questions addressed and support provided.    Objective Assessment: Vital Signs Vitals:   06/02/21 1320 06/02/21 1713  BP: 124/63 109/60  Pulse: (!) 110   Resp: 16   Temp: 97.9 F (36.6 C)   SpO2: 97%     Intake/Output Summary (Last 24 hours) at 06/02/2021 2036 Last data filed at 06/02/2021 1400 Gross per 24 hour  Intake 660 ml  Output 750 ml  Net -90 ml   Last Weight  Most recent update: 05/31/2021 12:33 AM    Weight  58.1 kg (128 lb 1.4 oz)            Gen:  NAD HEENT: moist mucous membranes CV: RRR PULM: diminished ABD: soft/nontender/nondistended/normal bowel sounds EXT: No edema Neuro: Alert and oriented x3  SUMMARY OF RECOMMENDATIONS   Continue with current plan of care per medical team  Recommend ongoing goals of care discussions.  Pending SNF rehab placement.  PMT will continue to support  and follow on as needed basis. Please secure chat for urgent needs.   Time Total: 35 min.   Visit consisted of counseling and education dealing with the complex and emotionally intense issues of symptom management and palliative care in the setting of serious and potentially life-threatening illness.Greater than 50%  of this time was spent counseling and coordinating care related to the above assessment and plan.  Alda Lea, AGPCNP-BC  Palliative Medicine Team [phone]  Palliative Medicine Team providers are available by phone from 7am to 7pm daily and can be reached through the team cell phone. Should this patient require assistance outside of these hours, please call the patient's attending physician.

## 2021-06-02 NOTE — Plan of Care (Signed)
Katelyn Kennedy reports feeling stronger today. She ambulated in the hall with PT and sat up in the chair for several hours. She remains intermittently confused but reorients easily.  Problem: Education: Goal: Knowledge of General Education information will improve Description: Including pain rating scale, medication(s)/side effects and non-pharmacologic comfort measures Outcome: Progressing

## 2021-06-02 NOTE — Progress Notes (Signed)
[facility]  PROGRESS NOTE  Katelyn Kennedy ZOX:[id number] DOB: February 23, 1946 DOA: 05/30/2021 PCP: [name], MD  Brief hospital course  76 y.o. 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 Presenting with worsening left side weakness and left thigh pain. Her family notes that she has become progressively more weak over these last couple of weeks. She is having difficulty performing her ADLs without assistance. It is has now become impossible to get her in and out of bed and to help her transfer. She woke her sister this morning with complaints of thigh pain this morning, so she decided to bring her to the ED for help. They deny any other aggravating or alleviating factors.       Subjective   Patient seen and examined, MRI of cervical thoracic and lumbar spine was done on Saturday which did not show any metastasis.  Patient left-sided weakness has improved     Assessment and Plan:  * Weakness of left side of body - Presented with left-sided hemiparesis -MRI brain showed no change in brain metastasis involving right cerebral hemisphere, vasogenic edema and similar 1.3 cm left foot midline shift anteriorly -Admitting physician spoke to neuro-oncology, total spine MRI screen was ordered along with Decadron -MRI of total spine showed no metastasis -Called and discussed Dr. Mickeal Skinner, neuro-oncology -He recommends discharge on Decadron 4 mg twice daily; they will arrange appointment as outpatient -We will change Decadron from IV to p.o.   HTN (hypertension) - Blood pressure is stable -Continue lisinopril  Acute deep vein thrombosis (DVT) of proximal vein of left lower extremity ([place])- (present on admission) History of DVT -Admitting provider spoke to neuro-oncology -Okay to continue Eliquis  Non-small cell lung cancer metastatic to brain [facility])- (present on admission) - Patient will follow-up with oncology as  outpatient -Palliative care consulted; plan to continue current care  Vasogenic brain edema (HCC)- (present on admission) - Seen on MRI brain -Started on Decadron 4 mg IV every 6 hours -Change Decadron 4 mg p.o. twice daily  Diet-controlled type 2 diabetes mellitus ([place]) - Started on sliding scale insulin with NovoLog -CBG has been elevated due to steroids -CBG improved after starting Lantus 10 units subcu daily        Medications     amitriptyline  50 mg Oral QHS   apixaban  5 mg Oral BID   atorvastatin  40 mg Oral QPM   dexamethasone (DECADRON) injection  4 mg Intravenous Q6H   insulin aspart  0-6 Units Subcutaneous TID WC   insulin glargine-yfgn  10 Units Subcutaneous Daily   levETIRAcetam  500 mg Oral BID   lisinopril  2.5 mg Oral Daily     Data Reviewed:   CBG:  Recent Labs  Lab 06/01/21 1634 06/01/21 2135 06/02/21 0726 06/02/21 1117 06/02/21 1621  GLUCAP 132* 289* 158* 150* 187*    SpO2: 97 %    Vitals:   06/01/21 2143 06/02/21 0421 06/02/21 1320 06/02/21 1713  BP: 119/70 (!) 123/59 124/63 109/60  Pulse: (!) 110 (!) 101 (!) 110   Resp: 18 18 16    Temp: 99.1 F (37.3 C) 98.2 F (36.8 C) 97.9 F (36.6 C)   TempSrc: Oral Oral Oral   SpO2: 96% 99% 97%   Weight:      Height:          Data Reviewed:  Basic Metabolic Panel: Recent Labs  Lab 05/30/21 0856 05/31/21 0936  NA 138  137  K 3.7 3.8  CL 105 103  CO2 26 24  GLUCOSE 125* 156*  BUN 12 19  CREATININE 0.77 0.59  CALCIUM 10.0 9.6    CBC: Recent Labs  Lab 05/30/21 0856 05/31/21 0936  WBC 9.7 6.9  NEUTROABS 7.6  --   HGB 11.7* 11.3*  HCT 36.9 35.8*  MCV 91.1 91.6  PLT 373 434*    LFT Recent Labs  Lab 05/30/21 0856 05/31/21 0936  AST 25 27  ALT 24 23  ALKPHOS 135* 127*  BILITOT 0.3 0.4  PROT 7.9 7.5  ALBUMIN 3.4* 3.1*     Micro : Blood cultures x2 obtained, result pending    Antibiotics: Anti-infectives (From admission, onward)    None           DVT prophylaxis: Apixaban  Code Status: DNR  Family Communication: No family at bedside  CONSULTS:    Objective    Physical Examination:   General-appears in no acute distress Heart-S1-S2, regular, no murmur auscultated Lungs-clear to auscultation bilaterally, no wheezing or crackles auscultated Abdomen-soft, nontender, no organomegaly Extremities-no edema in the lower extremities Neuro-alert, oriented x3, no focal deficit noted  Status is: Inpatient: Left-sided weakness        [place]   [facility] If 7PM-7AM, please contact night-coverage at [email or web address], Office  [phone]   06/02/2021, 5:23 PM  LOS: 2 days

## 2021-06-02 NOTE — Progress Notes (Signed)
Physical Therapy Treatment Patient Details Name: Katelyn Kennedy MRN: [id number] DOB: 15-Aug-1945 Today's Date: 06/02/2021   History of Present Illness patient is a 76 year old female who presented to the hospital with weakness and leg pain. patient was noted to have had recent hosptialization with submassive PE with heart strain. patient was admitted with left sided weakness and HTN PMH: stage 4 NSCLS with brain mets, L side weakness, HTN, DM    PT Comments    Pt ambulated 82' with RW with ongoing verbal cues for positioning in RW. Noted improvement in activity tolerance today.    Recommendations for follow up therapy are one component of a multi-disciplinary discharge planning process, led by the attending physician.  Recommendations may be updated based on patient status, additional functional criteria and insurance authorization.  Follow Up Recommendations  Skilled nursing-short term rehab (<3 hours/day)     Assistance Recommended at Discharge Intermittent Supervision/Assistance  Patient can return home with the following A little help with bathing/dressing/bathroom;Assistance with cooking/housework;Assist for transportation;Help with stairs or ramp for entrance;A lot of help with walking and/or transfers;Direct supervision/assist for financial management;Direct supervision/assist for medications management   Equipment Recommendations  None recommended by PT    Recommendations for Other Services       Precautions / Restrictions Precautions Precautions: Fall Precaution Comments: L side neglect Restrictions Weight Bearing Restrictions: No     Mobility  Bed Mobility Overal bed mobility: Needs Assistance Bed Mobility: Supine to Sit     Supine to sit: Mod assist, HOB elevated     General bed mobility comments: with increased time and cues for initation, sequencing and safety;  Physical assist to manage LEs, to bring trunk to upright and to complete rotation to EOB sitting  with use of bed pad    Transfers Overall transfer level: Needs assistance Equipment used: Rolling walker (2 wheels) Transfers: Sit to/from Stand Sit to Stand: Min assist           General transfer comment: cues for use of UEs to self assist with physcal assist to bring wt up and fwd and to balance in standing with noted posterior drift    Ambulation/Gait Ambulation/Gait assistance: Min assist Gait Distance (Feet): 40 Feet Assistive device: Rolling walker (2 wheels) Gait Pattern/deviations: Step-to pattern, Decreased step length - left, Shuffle, Decreased step length - right, Trunk flexed Gait velocity: decr     General Gait Details: cues for sequence, posture and position from [employer]             Wheelchair Mobility    Modified Rankin (Stroke Patients Only)       Balance Overall balance assessment: Needs assistance Sitting-balance support: Feet supported Sitting balance-Leahy Scale: Good     Standing balance support: Bilateral upper extremity supported, During functional activity, Reliant on assistive device for balance Standing balance-Leahy Scale: Poor Standing balance comment: relies on BUE support                            Cognition Arousal/Alertness: Awake/alert Behavior During Therapy: WFL for tasks assessed/performed                                   General Comments: increased time and multimodal cues to process commands        Exercises      General Comments  Pertinent Vitals/Pain Pain Assessment Pain Assessment: No/denies pain    Home Living                          Prior Function            PT Goals (current goals can now be found in the care plan section) Acute Rehab PT Goals Patient Stated Goal: Regain IND PT Goal Formulation: With patient Time For Goal Achievement: 06/14/21 Potential to Achieve Goals: Fair Progress towards PT goals: Progressing toward goals     Frequency    Min 3X/week      PT Plan Current plan remains appropriate    Co-evaluation PT/OT/SLP Co-Evaluation/Treatment: Yes            AM-PAC PT "6 Clicks" Mobility   Outcome Measure  Help needed turning from your back to your side while in a flat bed without using bedrails?: A Lot Help needed moving from lying on your back to sitting on the side of a flat bed without using bedrails?: A Lot Help needed moving to and from a bed to a chair (including a wheelchair)?: A Little Help needed standing up from a chair using your arms (e.g., wheelchair or bedside chair)?: A Little Help needed to walk in hospital room?: A Little Help needed climbing 3-5 steps with a railing? : A Lot 6 Click Score: 15    End of Session Equipment Utilized During Treatment: Gait belt Activity Tolerance: Patient limited by fatigue Patient left: in chair;with call bell/phone within reach;with chair alarm set Nurse Communication: Mobility status PT Visit Diagnosis: Unsteadiness on feet (R26.81);Muscle weakness (generalized) (M62.81);History of falling (Z91.81);Difficulty in walking, not elsewhere classified (R26.2)     Time: 2585-2778 PT Time Calculation (min) (ACUTE ONLY): 21 min  Charges:  $Gait Training: 8-22 mins                    Blondell Reveal Kistler PT 06/02/2021  Acute Rehabilitation Services Pager [phone] Office [phone]

## 2021-06-03 ENCOUNTER — Telehealth: Payer: Self-pay | Admitting: *Deleted

## 2021-06-03 DIAGNOSIS — R29898 Other symptoms and signs involving the musculoskeletal system: Secondary | ICD-10-CM | POA: Diagnosis not present

## 2021-06-03 DIAGNOSIS — I10 Essential (primary) hypertension: Secondary | ICD-10-CM | POA: Diagnosis not present

## 2021-06-03 DIAGNOSIS — R531 Weakness: Secondary | ICD-10-CM | POA: Diagnosis not present

## 2021-06-03 DIAGNOSIS — E119 Type 2 diabetes mellitus without complications: Secondary | ICD-10-CM | POA: Diagnosis not present

## 2021-06-03 LAB — GLUCOSE, CAPILLARY
Glucose-Capillary: 134 mg/dL — ABNORMAL HIGH (ref 70–99)
Glucose-Capillary: 141 mg/dL — ABNORMAL HIGH (ref 70–99)
Glucose-Capillary: 171 mg/dL — ABNORMAL HIGH (ref 70–99)
Glucose-Capillary: 294 mg/dL — ABNORMAL HIGH (ref 70–99)

## 2021-06-03 NOTE — TOC Progression Note (Addendum)
Transition of Care [facility]) - Progression Note    Patient Details  Name: Katelyn Kennedy MRN: [id number] Date of Birth: 1946/03/19  Transition of Care [facility]) CM/SW Contact  Jace Fermin, Juliann Pulse, RN Phone Number: 06/03/2021, 9:18 AM  Clinical Narrative:    Not approved for SNF from HTA-per HTA-has gone to rehab within 6 months,baseline,cognition worse,palliative following likely need hospice Dr. Darrick Meigs if u want to do p2p-contact Dr. Phoebe Perch before 1p at [phone] if not let me know so HTA can issues denial letter to patient -10:30a-denied P2P-[facility] HHRN/PT/OT/aide/csw;PTAR [phone] approved. -1p-Family is appealing denial from peer to peer. HHC as back up-[facility] HHRN/OPT/OT aide/csw. PTAR [id number].    Expected Discharge Plan: [place]    Expected Discharge Plan and Services Expected Discharge Plan: [place]                                               Social Determinants of Health (SDOH) Interventions    Readmission Risk Interventions Readmission Risk Prevention Plan 03/19/2021 11/08/2020  Post Dischage Appt - Complete  Medication Screening - Complete  Transportation Screening Complete Complete  PCP or Specialist Appt within 3-5 Days Complete -  HRI or Home Care Consult Complete -  Social Work Consult for [place] Planning/Counseling Complete -  Palliative Care Screening Not Applicable -  Medication Review Press photographer) Complete -  Some recent data might be hidden

## 2021-06-03 NOTE — Progress Notes (Signed)
[facility]  PROGRESS NOTE  Katelyn Kennedy KZS:[id number] DOB: [birth year] DOA: 05/30/2021 PCP: [name], MD  Brief hospital course  76 y.o. 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 Presenting with worsening left side weakness and left thigh pain. Her family notes that she has become progressively more weak over these last couple of weeks. She is having difficulty performing her ADLs without assistance. It is has now become impossible to get her in and out of bed and to help her transfer. She woke her sister this morning with complaints of thigh pain this morning, so she decided to bring her to the ED for help. They deny any other aggravating or alleviating factors.       Subjective   Patient seen and examined, no new complaints.  Insurance company has denied her authorization for skilled nursing facility.  Patient's sister at bedside.     Assessment and Plan:  * Weakness of left side of body - Presented with left-sided hemiparesis -MRI brain showed no change in brain metastasis involving right cerebral hemisphere, vasogenic edema and similar 1.3 cm left foot midline shift anteriorly -Admitting physician spoke to neuro-oncology, total spine MRI screen was ordered along with Decadron -MRI of total spine showed no metastasis -Called and discussed Dr. Mickeal Skinner, neuro-oncology -He recommends discharge on Decadron 4 mg twice daily; they will arrange appointment as outpatient -IV Decadron discontinued, started on Decadron 4 mg p.o. twice daily   HTN (hypertension) - Blood pressure is stable -Continue lisinopril  Acute deep vein thrombosis (DVT) of proximal vein of left lower extremity ([place])- (present on admission) History of DVT -Admitting provider spoke to neuro-oncology -Okay to continue Eliquis  Non-small cell lung cancer metastatic to brain [facility])- (present on admission) - Patient will follow-up with oncology  as outpatient -Palliative care consulted; plan to continue current care  Vasogenic brain edema (HCC)- (present on admission) - Seen on MRI brain -Started on Decadron 4 mg IV every 6 hours -Change Decadron 4 mg p.o. twice daily  Diet-controlled type 2 diabetes mellitus ([place]) - Started on sliding scale insulin with NovoLog -CBG has been elevated due to steroids -CBG improved after starting Lantus 10 units subcu daily    Family is appealing the denial by insurance company    Medications     amitriptyline  50 mg Oral QHS   apixaban  5 mg Oral BID   atorvastatin  40 mg Oral QPM   dexamethasone  4 mg Oral Q12H   insulin aspart  0-6 Units Subcutaneous TID WC   insulin glargine-yfgn  10 Units Subcutaneous Daily   levETIRAcetam  500 mg Oral BID   lisinopril  2.5 mg Oral Daily     Data Reviewed:   CBG:  Recent Labs  Lab 06/02/21 1117 06/02/21 1621 06/02/21 2101 06/03/21 0747 06/03/21 1120  GLUCAP 150* 187* 201* 134* 141*    SpO2: 94 %    Vitals:   06/02/21 2059 06/03/21 0523 06/03/21 0907 06/03/21 1122  BP: (!) 100/56 103/62 111/72 103/70  Pulse: 99 90 (!) 108 98  Resp: 20 20 19 20   Temp: 98.2 F (36.8 C) 97.9 F (36.6 C) 97.7 F (36.5 C) 97.9 F (36.6 C)  TempSrc: Oral Oral Oral Oral  SpO2: 93% 95% 96% 94%  Weight:      Height:          Data Reviewed:  Basic Metabolic Panel: Recent Labs  Lab 05/30/21 0856  05/31/21 0936  NA 138 137  K 3.7 3.8  CL 105 103  CO2 26 24  GLUCOSE 125* 156*  BUN 12 19  CREATININE 0.77 0.59  CALCIUM 10.0 9.6    CBC: Recent Labs  Lab 05/30/21 0856 05/31/21 0936  WBC 9.7 6.9  NEUTROABS 7.6  --   HGB 11.7* 11.3*  HCT 36.9 35.8*  MCV 91.1 91.6  PLT 373 434*    LFT Recent Labs  Lab 05/30/21 0856 05/31/21 0936  AST 25 27  ALT 24 23  ALKPHOS 135* 127*  BILITOT 0.3 0.4  PROT 7.9 7.5  ALBUMIN 3.4* 3.1*     Micro : Blood cultures x2 obtained, result pending    Antibiotics: Anti-infectives (From  admission, onward)    None          DVT prophylaxis: Apixaban  Code Status: DNR  Family Communication: Discussed with sister at bedside  CONSULTS:    Objective    Physical Examination:   General-appears in no acute distress Heart-S1-S2, regular, no murmur auscultated Lungs-clear to auscultation bilaterally, no wheezing or crackles auscultated Abdomen-soft, nontender, no organomegaly Extremities-no edema in the lower extremities Neuro-alert, oriented x3, no focal deficit noted  Status is: Inpatient: Left-sided weakness        [place]   [facility] If 7PM-7AM, please contact night-coverage at [email or web address], Office  [phone]   06/03/2021, 1:23 PM  LOS: 3 days

## 2021-06-03 NOTE — Telephone Encounter (Signed)
Patients sister in law [name] called, very concerned about hospital discharging patient home.  She states that insurance denied SNF stating patients status was too poor to validate snf/rehab.  Last note by Case Manager states "baseline,cognition worse,palliative following likely need hospice".    Sister in law is asking if Dr Mickeal Skinner feels that she is appropriate for [facility] and if so would he provide his evaluation to the hospitalist.  I explained that she may be appropriate for hospice but not [facility] yet and that was a great question to find out.    Sister in law states she is unable to provide care in the home and no other family is available and is extremely worried about her coming home even if it is with hospice.

## 2021-06-04 ENCOUNTER — Telehealth: Payer: Self-pay

## 2021-06-04 ENCOUNTER — Inpatient Hospital Stay: Payer: PPO | Admitting: Nurse Practitioner

## 2021-06-04 DIAGNOSIS — R531 Weakness: Secondary | ICD-10-CM | POA: Diagnosis not present

## 2021-06-04 DIAGNOSIS — E119 Type 2 diabetes mellitus without complications: Secondary | ICD-10-CM | POA: Diagnosis not present

## 2021-06-04 LAB — GLUCOSE, CAPILLARY
Glucose-Capillary: 161 mg/dL — ABNORMAL HIGH (ref 70–99)
Glucose-Capillary: 190 mg/dL — ABNORMAL HIGH (ref 70–99)
Glucose-Capillary: 209 mg/dL — ABNORMAL HIGH (ref 70–99)
Glucose-Capillary: 253 mg/dL — ABNORMAL HIGH (ref 70–99)

## 2021-06-04 LAB — CULTURE, BLOOD (ROUTINE X 2)
Culture: NO GROWTH
Culture: NO GROWTH
Special Requests: ADEQUATE
Special Requests: ADEQUATE

## 2021-06-04 NOTE — Telephone Encounter (Addendum)
Ms. Padmanabhan sister-in-law [name], called our office to inform us that the insurance is denying Ms. Pracht's admission to rehab. She stated they were told about a Representative Form that they could fill out to appeal the decision per case management, but didn't have a printer at home to print form. I printed out the form and left it at the front desk for them to pick up and fill out. I also advised them to follow up with their insurance company for further instructions on appeal process, as I am unable to offer any further assistance.  Family members also asking if Ms. Oetken is Hospice appropriate-routed this conversation to [place], NP.

## 2021-06-04 NOTE — TOC Progression Note (Signed)
Transition of Care [facility]) - Progression Note    Patient Details  Name: ERMA RAICHE MRN: [id number] Date of Birth: 1946/04/07  Transition of Care [facility]) CM/SW Contact  Antwyne Pingree, Juliann Pulse, RN Phone Number: 06/04/2021, 9:37 AM  Clinical Narrative:Per patient family member representing for Appeal on denial for SNF-ongoing process to complete form as patient's representative-await outcome.       Expected Discharge Plan: [place] Barriers to Discharge:  (Family appealing denial of SNF)  Expected Discharge Plan and Services Expected Discharge Plan: [place]                                               Social Determinants of Health (SDOH) Interventions    Readmission Risk Interventions Readmission Risk Prevention Plan 03/19/2021 11/08/2020  Post Dischage Appt - Complete  Medication Screening - Complete  Transportation Screening Complete Complete  PCP or Specialist Appt within 3-5 Days Complete -  HRI or Home Care Consult Complete -  Social Work Consult for [place] Planning/Counseling Complete -  Palliative Care Screening Not Applicable -  Medication Review Press photographer) Complete -  Some recent data might be hidden

## 2021-06-04 NOTE — Progress Notes (Signed)
°  Progress Note   Patient: Katelyn Kennedy JQG:[id number] DOB: 26-May-1945 DOA: 05/30/2021     4 DOS: the patient was seen and examined on 06/04/2021   Brief hospital course: 76 y.o. 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 Presenting with worsening left side weakness and left thigh pain. Her family notes that she has become progressively more weak over these last couple of weeks. She is having difficulty performing her ADLs without assistance. It is has now become impossible to get her in and out of bed and to help her transfer. She woke her sister this morning with complaints of thigh pain this morning, so she decided to bring her to the ED for help. They deny any other aggravating or alleviating factors.    Assessment and Plan: * Weakness of left side of body - Presented with left-sided hemiparesis -MRI brain showed no change in brain metastasis involving right cerebral hemisphere, vasogenic edema and similar 1.3 cm left foot midline shift anteriorly -Admitting physician spoke to neuro-oncology, total spine MRI screen was ordered along with Decadron -MRI of total spine showed no metastasis -He recommends discharge on Decadron 4 mg twice daily; they will arrange appointment as outpatient -IV Decadron discontinued, started on Decadron 4 mg p.o. twice daily -Insurance initially declined SNF stay. Family currently appealing denial  HTN (hypertension) - Blood pressure is stable -Continue lisinopril  Acute deep vein thrombosis (DVT) of proximal vein of left lower extremity ([place])- (present on admission) History of DVT -Admitting provider spoke to neuro-oncology -Okay to continue Eliquis  Non-small cell lung cancer metastatic to brain [facility])- (present on admission) - Patient will follow-up with oncology as outpatient -Palliative care following  Vasogenic brain edema ([place])- (present on admission) - Seen on MRI brain -Started on  Decadron 4 mg IV every 6 hours -Change Decadron 4 mg p.o. twice daily  Diet-controlled type 2 diabetes mellitus ([place]) - Started on sliding scale insulin with NovoLog -CBG has been elevated due to steroids -CBG improved after starting Lantus 10 units subcu daily         Subjective: Without complaints. Reports feeling mildly stronger this AM  Physical Exam: Vitals:   06/03/21 2034 06/04/21 0559 06/04/21 0937 06/04/21 1309  BP: (!) 101/56 104/63 104/62 115/61  Pulse: 94 85  (!) 101  Resp: 20 20  18   Temp: 98.1 F (36.7 C) 98.4 F (36.9 C)  98.2 F (36.8 C)  TempSrc: Oral   Oral  SpO2: 95% 96%  97%  Weight:      Height:       General exam: Awake, laying in bed, in nad Respiratory system: Normal respiratory effort, no wheezing Cardiovascular system: regular rate, s1, s2 Gastrointestinal system: Soft, nondistended, positive BS Central nervous system: CN2-12 grossly intact, strength intact Extremities: Perfused, no clubbing Skin: Normal skin turgor, no notable skin lesions seen Psychiatry: Mood normal // no visual hallucinations   Data Reviewed:  Labs reviewed, renal function stable  Family Communication: Pt in room, family not at bedside  Disposition: Status is: Inpatient Remains inpatient appropriate because: Severity of illness   Planned Discharge Destination:  Unclear at this time       Author: [name], MD 06/04/2021 1:27 PM  For on call review [email or web address].

## 2021-06-04 NOTE — Telephone Encounter (Signed)
Erroneous

## 2021-06-04 NOTE — Progress Notes (Signed)
Physical Therapy Treatment Patient Details Name: Katelyn Kennedy MRN: [id number] DOB: [birth year] Today's Date: 06/04/2021   History of Present Illness patient is a 76 year old female who presented to the hospital with weakness and leg pain. patient was noted to have had recent hosptialization with submassive PE with heart strain. patient was admitted with left sided weakness and HTN PMH: stage 4 NSCLS with brain mets, L side weakness, HTN, DM    PT Comments    Pt progressing with PT, incr amb distance, incr tolerance to activity however requires constant cues for safety and min assist for balance and RW management. Would continue to recommend SNF as she will need 24hr care at home, unless family chooses Hopsice at d/c. It appears pt has potential to incr her overall strength and endurance however from a cognitive standpoint will likely continue to require 24 hr supervision. Discussed with pt family.   Recommendations for follow up therapy are one component of a multi-disciplinary discharge planning process, led by the attending physician.  Recommendations may be updated based on patient status, additional functional criteria and insurance authorization.  Follow Up Recommendations  Other (comment) (no SNF benefit,?HHPT vs home with Hospice vs SNF)     Assistance Recommended at Discharge Frequent or constant Supervision/Assistance  Patient can return home with the following A little help with walking and/or transfers;A little help with bathing/dressing/bathroom;Assistance with cooking/housework;Direct supervision/assist for medications management;Assist for transportation;Help with stairs or ramp for entrance   Equipment Recommendations  None recommended by PT    Recommendations for Other Services       Precautions / Restrictions Precautions Precautions: Fall Precaution Comments: L side neglect Restrictions Weight Bearing Restrictions: No     Mobility  Bed Mobility Overal bed  mobility: Needs Assistance Bed Mobility: Supine to Sit     Supine to sit: Min assist, Mod assist     General bed mobility comments: with increased time and cues for initation, sequencing and safety;  Physical assist to manage LEs, to bring trunk to upright and to complete rotation to EOB sitting with use of bed pad    Transfers Overall transfer level: Needs assistance Equipment used: Rolling walker (2 wheels) Transfers: Sit to/from Stand Sit to Stand: Min assist           General transfer comment: cues for use of UEs to self assist with physcal assist to bring wt up and fwd and to balance in standing. repeated x4 for strengthening, activity tolerance and improved carryover    Ambulation/Gait Ambulation/Gait assistance: Min assist Gait Distance (Feet): 60 Feet Assistive device: Rolling walker (2 wheels) Gait Pattern/deviations: Step-to pattern, Decreased step length - left, Shuffle, Decreased step length - right, Trunk flexed Gait velocity: decr     General Gait Details: near constant multi-modal cues for sequence, posture and position from RW. pt tends to walk too far behind RW, poor safety awareness and absent carryover from this ir previous sessions   Stairs             Wheelchair Mobility    Modified Rankin (Stroke Patients Only)       Balance   Sitting-balance support: Feet supported Sitting balance-Leahy Scale: Good     Standing balance support: Bilateral upper extremity supported, During functional activity, Reliant on assistive device for balance Standing balance-Leahy Scale: Poor Standing balance comment: relies on BUE support  Cognition Arousal/Alertness: Awake/alert Behavior During Therapy: WFL for tasks assessed/performed Overall Cognitive Status: No family/caregiver present to determine baseline cognitive functioning Area of Impairment: Attention, Following commands, Memory, Safety/judgement, Problem  solving, Awareness                   Current Attention Level: Focused Memory: Decreased short-term memory Following Commands: Follows one step commands with increased time, Follows multi-step commands inconsistently Safety/Judgement: Decreased awareness of safety, Decreased awareness of deficits   Problem Solving: Slow processing, Difficulty sequencing, Requires verbal cues, Requires tactile cues General Comments: increased time and multimodal cues to process. frequent redirection to task        Exercises      General Comments        Pertinent Vitals/Pain Pain Assessment Pain Assessment: No/denies pain    Home Living                          Prior Function            PT Goals (current goals can now be found in the care plan section) Acute Rehab PT Goals Patient Stated Goal: Regain IND PT Goal Formulation: With patient Time For Goal Achievement: 06/14/21 Potential to Achieve Goals: Fair Progress towards PT goals: Progressing toward goals    Frequency    Min 3X/week      PT Plan Current plan remains appropriate    Co-evaluation              AM-PAC PT "6 Clicks" Mobility   Outcome Measure  Help needed turning from your back to your side while in a flat bed without using bedrails?: A Lot Help needed moving from lying on your back to sitting on the side of a flat bed without using bedrails?: A Lot Help needed moving to and from a bed to a chair (including a wheelchair)?: A Little Help needed standing up from a chair using your arms (e.g., wheelchair or bedside chair)?: A Little Help needed to walk in hospital room?: A Little Help needed climbing 3-5 steps with a railing? : A Lot 6 Click Score: 15    End of Session Equipment Utilized During Treatment: Gait belt Activity Tolerance: Patient tolerated treatment well Patient left: in chair;with call bell/phone within reach;with chair alarm set Nurse Communication: Mobility status PT  Visit Diagnosis: Unsteadiness on feet (R26.81);Muscle weakness (generalized) (M62.81);History of falling (Z91.81);Difficulty in walking, not elsewhere classified (R26.2)     Time: 4536-4680 PT Time Calculation (min) (ACUTE ONLY): 23 min  Charges:  $Gait Training: 23-37 mins                     Baxter Flattery, PT  Acute Rehab Dept ([place]) [phone] Pager [phone]  06/04/2021    [facility] 06/04/2021, 11:32 AM

## 2021-06-04 NOTE — TOC Progression Note (Signed)
Transition of Care [facility]) - Progression Note    Patient Details  Name: Katelyn Kennedy MRN: [id number] Date of Birth: [birth year]  [facility]) CM/SW Contact  Annalysia Willenbring, Juliann Pulse, RN Phone Number: 06/04/2021, 12:27 PM  Clinical Narrative: Patient's advocate Wenda(sister in law) assisting patient  with appeal on denial fo SNF-await outcome;HHC set up if needed. Noted home hospice may be also be considered.Continue to monitor for d/c plans.    Expected Discharge Plan: [place] Barriers to Discharge:  (Family appealing denial of SNF)  Expected Discharge Plan and Services Expected Discharge Plan: [place]                                               Social Determinants of Health (SDOH) Interventions    Readmission Risk Interventions Readmission Risk Prevention Plan 03/19/2021 11/08/2020  Post Dischage Appt - Complete  Medication Screening - Complete  Transportation Screening Complete Complete  PCP or Specialist Appt within 3-5 Days Complete -  HRI or Home Care Consult Complete -  Social Work Consult for [place] Planning/Counseling Complete -  Palliative Care Screening Not Applicable -  Medication Review Press photographer) Complete -  Some recent data might be hidden

## 2021-06-04 NOTE — Telephone Encounter (Signed)
Patient's daughter called and is concerned about her mother being sent home today. Insurance denied her to go to a SNF, but the daughter thinks if Dr. Mickeal Skinner agrees the patient needs SNF then insurance will cover it. The hospital is going to discharge the patient today, and the daughter says there is no one that can care for the patient at home. I informed her that I will send a message to Dr. Mickeal Skinner for his input. She verbalized understanding and had no further questions at this time.

## 2021-06-04 NOTE — Progress Notes (Signed)
Occupational Therapy Treatment Patient Details Name: Katelyn Kennedy MRN: [id number] DOB: [birth year] Today's Date: 06/04/2021   History of present illness patient is a 76 year old female who presented to the hospital with weakness and leg pain. patient was noted to have had recent hosptialization with submassive PE with heart strain. patient was admitted with left sided weakness and HTN PMH: stage 4 NSCLS with brain mets, L side weakness, HTN, DM   OT comments  Patient able to ambulate to the bathroom with min guard and use of RW but needing min assist for toilet transfer. She needed multimodal cues to line up on the toilet safely. Patient able to assist with toileting - wiping herself and attempting to assist with underwear and gown. She leans to the left and is somewhat unattentive to the left. She was able to follow all commands with verbal cues at time. She was able to answer orientation questions - and being off on the date by 7 days. She appears to be making progress. Unsure of patient's cognitive baseline.    Recommendations for follow up therapy are one component of a multi-disciplinary discharge planning process, led by the attending physician.  Recommendations may be updated based on patient status, additional functional criteria and insurance authorization.    Follow Up Recommendations  Skilled nursing-short term rehab (<3 hours/day)    Assistance Recommended at Discharge Frequent or constant Supervision/Assistance  Patient can return home with the following  A little help with walking and/or transfers;A lot of help with bathing/dressing/bathroom   Equipment Recommendations  None recommended by OT    Recommendations for Other Services      Precautions / Restrictions Precautions Precautions: Fall Precaution Comments: L side weakness, inattention Restrictions Weight Bearing Restrictions: No       Mobility Bed Mobility                    Transfers                          Balance Overall balance assessment: Needs assistance Sitting-balance support: No upper extremity supported, Feet supported Sitting balance-Leahy Scale: Good     Standing balance support: Reliant on assistive device for balance Standing balance-Leahy Scale: Poor Standing balance comment: relies on BUE support                           ADL either performed or assessed with clinical judgement   ADL Overall ADL's : Needs assistance/impaired                         Toilet Transfer: Minimal assistance;Regular Toilet;Grab bars Toilet Transfer Details (indicate cue type and reason): Min assist and heavy verbal cues to transfer on to toilet - patient not moving far enough to the left. Toileting- Clothing Manipulation and Hygiene: Sitting/lateral lean;Moderate assistance Toileting - Clothing Manipulation Details (indicate cue type and reason): Mod assist for clothing management - patient able to partitally pull down underwear and manage gown. She is able to wipe herself. had more left lateral lean and needed increased assistance with pulling up underwear after pericare.     Functional mobility during ADLs: Minimal assistance General ADL Comments: Predominantly min guard with walker with verbal cues for technique and where to manuever. Min assist for toilet transfers. Ambulation is slow. Does tend to need to be cued to move to the left.  Extremity/Trunk Assessment              Vision       Perception     Praxis      Cognition Arousal/Alertness: Awake/alert Behavior During Therapy: WFL for tasks assessed/performed Overall Cognitive Status: No family/caregiver present to determine baseline cognitive functioning Area of Impairment: Attention, Following commands, Memory, Safety/judgement, Problem solving, Awareness                   Current Attention Level: Sustained   Following Commands: Follows one step commands  consistently Safety/Judgement: Decreased awareness of safety Awareness: Emergent Problem Solving: Slow processing, Requires verbal cues, Decreased initiation General Comments: Patient alert to self, place, month and year. Is able to follow commands - needs increased to initiate. Needs verbal cues for safety, hand placement        Exercises      Shoulder Instructions       General Comments      Pertinent Vitals/ Pain       Pain Assessment Pain Assessment: No/denies pain  Home Living                                          Prior Functioning/Environment              Frequency  Min 2X/week        Progress Toward Goals  OT Goals(current goals can now be found in the care plan section)  Progress towards OT goals: Progressing toward goals  Acute Rehab OT Goals Patient Stated Goal: to get stronger OT Goal Formulation: With patient Time For Goal Achievement: 06/14/21 Potential to Achieve Goals: [place] Discharge plan remains appropriate    Co-evaluation          OT goals addressed during session: ADL's and self-care      AM-PAC OT "6 Clicks" Daily Activity     Outcome Measure   Help from another person eating meals?: A Little Help from another person taking care of personal grooming?: A Little Help from another person toileting, which includes using toliet, bedpan, or urinal?: A Lot Help from another person bathing (including washing, rinsing, drying)?: A Little Help from another person to put on and taking off regular upper body clothing?: A Little Help from another person to put on and taking off regular lower body clothing?: A Lot 6 Click Score: 16    End of Session Equipment Utilized During Treatment: Gait belt;Rolling walker (2 wheels)  OT Visit Diagnosis: Unsteadiness on feet (R26.81);Muscle weakness (generalized) (M62.81)   Activity Tolerance Patient tolerated treatment well   Patient Left in bed;with bed alarm set;with  call bell/phone within reach   Nurse Communication Mobility status        Time: 1343-1405 OT Time Calculation (min): 22 min  Charges: OT General Charges $OT Visit: 1 Visit OT Treatments $Self Care/Home Management : 8-22 mins  Derl Barrow, OTR/L [place]  Office [phone] Pager: [place] 06/04/2021, 3:27 PM

## 2021-06-05 LAB — GLUCOSE, CAPILLARY
Glucose-Capillary: 161 mg/dL — ABNORMAL HIGH (ref 70–99)
Glucose-Capillary: 248 mg/dL — ABNORMAL HIGH (ref 70–99)
Glucose-Capillary: 160 mg/dL — ABNORMAL HIGH (ref 70–99)
Glucose-Capillary: 196 mg/dL — ABNORMAL HIGH (ref 70–99)

## 2021-06-05 NOTE — TOC Progression Note (Signed)
Transition of Care [facility]) - Progression Note    Patient Details  Name: MARLIES LIGMAN MRN: [id number] Date of Birth: 1945-07-14  Transition of Care [facility]) CM/SW Contact  Carline Dura, Juliann Pulse, RN Phone Number: 06/05/2021, 9:14 AM  Clinical Narrative:    appeal in process eff 2/22-2/25(72hrs) per [name] sister in law.Noted min asst guard-getting better. Back up if denied SNF-[facility] already following. Dr. Wyline Copas please order hospital bed(we can always cancel).[name] is planning to have family to asst @ home.PTAR is already [place].Reminder if weekend d/c it can still happen if stable.    Expected Discharge Plan: [place] Barriers to Discharge:  (Family appealing denial of SNF)  Expected Discharge Plan and Services Expected Discharge Plan: [place]                                               Social Determinants of Health (SDOH) Interventions    Readmission Risk Interventions Readmission Risk Prevention Plan 03/19/2021 11/08/2020  Post Dischage Appt - Complete  Medication Screening - Complete  Transportation Screening Complete Complete  PCP or Specialist Appt within 3-5 Days Complete -  HRI or Home Care Consult Complete -  Social Work Consult for [place] Planning/Counseling Complete -  Palliative Care Screening Not Applicable -  Medication Review Press photographer) Complete -  Some recent data might be hidden

## 2021-06-05 NOTE — Progress Notes (Signed)
°  Progress Note   Patient: Katelyn Kennedy QIO:[id number] DOB: 1945-06-15 DOA: 05/30/2021     5 DOS: the patient was seen and examined on 06/05/2021   Brief hospital course: 76 y.o. 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 Presenting with worsening left side weakness and left thigh pain. Her family notes that she has become progressively more weak over these last couple of weeks. She is having difficulty performing her ADLs without assistance. It is has now become impossible to get her in and out of bed and to help her transfer. She woke her sister this morning with complaints of thigh pain this morning, so she decided to bring her to the ED for help. They deny any other aggravating or alleviating factors.    Assessment and Plan: * Weakness of left side of body - Presented with left-sided hemiparesis -MRI brain showed no change in brain metastasis involving right cerebral hemisphere, vasogenic edema and similar 1.3 cm left foot midline shift anteriorly -Admitting physician spoke to neuro-oncology, total spine MRI screen was ordered along with Decadron -MRI of total spine showed no metastasis -He recommends discharge on Decadron 4 mg twice daily; they will arrange appointment as outpatient -IV Decadron discontinued, started on Decadron 4 mg p.o. twice daily -Insurance initially declined SNF stay. Family currently appealing denial  HTN (hypertension) - Blood pressure is stable -Continue lisinopril  DVT (deep venous thrombosis) (HCC) -Continued on anticoagulation per above  Acute deep vein thrombosis (DVT) of proximal vein of left lower extremity ([place])- (present on admission) History of DVT -Admitting provider spoke to neuro-oncology -Okay to continue Eliquis -No evidence of acute blood loss  Non-small cell lung cancer metastatic to brain [facility])- (present on admission) - Patient will follow-up with oncology as  outpatient -Palliative care following  Vasogenic brain edema ([place])- (present on admission) - Seen on MRI brain -Started on Decadron 4 mg IV every 6 hours -Change Decadron 4 mg p.o. twice daily  Diet-controlled type 2 diabetes mellitus ([place]) - Started on sliding scale insulin with NovoLog -CBG has been elevated due to steroids -CBG improved after starting Lantus 10 units subcu daily -glycemic trends overall stable albeit with occalsional values in the 200's         Subjective: Without complaints. States being able to ambulate with some assistance overnight  Physical Exam: Vitals:   06/04/21 2125 06/05/21 0629 06/05/21 0920 06/05/21 1347  BP: 95/63 99/60 108/61 (!) 102/53  Pulse: 97 89  (!) 101  Resp: 18 18  20   Temp: 98.1 F (36.7 C) 98.4 F (36.9 C)  (!) 97.5 F (36.4 C)  TempSrc: Oral Oral  Oral  SpO2: 94% 95%  96%  Weight:      Height:       General exam: Conversant, in no acute distress Respiratory system: normal chest rise, clear, no audible wheezing Cardiovascular system: regular rhythm, s1-s2 Gastrointestinal system: Nondistended, nontender, pos BS Central nervous system: No seizures, no tremors Extremities: No cyanosis, no joint deformities Skin: No rashes, no pallor Psychiatry: Affect normal // no auditory hallucinations   Data Reviewed:  Labs reviewed, renal function stable  Family Communication: Pt in room, family not at bedside  Disposition: Status is: Inpatient Remains inpatient appropriate because: Severity of illness   Planned Discharge Destination:  Unclear at this time       Author: [name], MD 06/05/2021 5:06 PM  For on call review [email or web address].

## 2021-06-05 NOTE — Assessment & Plan Note (Signed)
-  Continued on anticoagulation per above

## 2021-06-06 ENCOUNTER — Other Ambulatory Visit: Payer: Self-pay | Admitting: Nurse Practitioner

## 2021-06-06 LAB — GLUCOSE, CAPILLARY
Glucose-Capillary: 138 mg/dL — ABNORMAL HIGH (ref 70–99)
Glucose-Capillary: 196 mg/dL — ABNORMAL HIGH (ref 70–99)
Glucose-Capillary: 211 mg/dL — ABNORMAL HIGH (ref 70–99)
Glucose-Capillary: 229 mg/dL — ABNORMAL HIGH (ref 70–99)

## 2021-06-06 MED ORDER — INSULIN GLARGINE 100 UNIT/ML SOLOSTAR PEN: 10.0000 [IU] | PEN_INJECTOR | Freq: Every day | SUBCUTANEOUS | 0 refills | Status: DC

## 2021-06-06 MED ORDER — DOCUSATE SODIUM 100 MG PO CAPS
100.0000 mg | ORAL_CAPSULE | Freq: Two times a day (BID) | ORAL | 0 refills | Status: AC
Start: 2021-06-06 — End: 2021-07-06

## 2021-06-06 MED ORDER — BLOOD GLUCOSE MONITOR KIT: PACK | 0 refills | Status: AC

## 2021-06-06 MED ORDER — INSULIN PEN NEEDLE 31G X 8 MM MISC: 1.0000 | Freq: Every day | 0 refills | Status: AC

## 2021-06-06 MED ORDER — DEXAMETHASONE 4 MG PO TABS: 4.0000 mg | ORAL_TABLET | Freq: Two times a day (BID) | ORAL | 0 refills | Status: DC

## 2021-06-06 MED ORDER — OXYCODONE HCL 5 MG PO TABS: 5.0000 mg | ORAL_TABLET | ORAL | 0 refills | Status: DC | PRN

## 2021-06-06 MED ORDER — DOCUSATE SODIUM 100 MG PO CAPS
100.0000 mg | ORAL_CAPSULE | Freq: Two times a day (BID) | ORAL | Status: DC
  Administered 2021-06-06 (×2): 100 mg via ORAL
  Filled 2021-06-06 (×2): qty 1

## 2021-06-06 MED ORDER — POLYETHYLENE GLYCOL 3350 17 G PO PACK: 17.0000 g | PACK | Freq: Every day | ORAL | Status: DC | PRN

## 2021-06-06 NOTE — TOC Transition Note (Addendum)
Transition of Care [facility]) - CM/SW Discharge Note   Patient Details  Name: Katelyn Kennedy MRN: [id number] Date of Birth: Jun 20, 1945  [facility]) CM/SW Contact:  Dessa Phi, RN Phone Number: 06/06/2021, 10:19 AM   Clinical Narrative: Per Wenda(sister in law) denied appeal for SNF-agree to home w/HHC-[facility] rep Corene Cornea already following;dme needed hospital bed-[facility] rep Andee Poles will contact Wenda for arrangements of delivery into home-bed must arrive prior patient coming by PTAR if d/c today.MD updated.   -3:33p-Received message per [employer] hospital bed has been delivered to home-left vm w/Wenda [phone]-to call hospbacital back so we can confirm delivery & safe d/c home prior calling PTAR-Nsg will manage PTAR. No further CM needs. -3:51p-Received call fro Wenda-hospital bed in the home. All in agreement for transport home.PTAR called. MD updated/No further CM needs.    Final next level of care: Home w Home Health Services Barriers to Discharge: No Barriers Identified   Patient Goals and CMS Choice     Choice offered to / list presented to : Sibling  Discharge Placement                       Discharge Plan and Services                DME Arranged: Hospital bed DME Agency: [facility] Date DME Agency Contacted: 06/06/21 Time DME Agency Contacted: 46 Representative spoke with at DME Agency: [place] Arranged: RN, PT, OT, Nurse's Aide, Social Work          Social Determinants of Health ([place]) Interventions     Readmission Risk Interventions Readmission Risk Prevention Plan 03/19/2021 11/08/2020  Post Dischage Appt - Complete  Medication Screening - Complete  Transportation Screening Complete Complete  PCP or Specialist Appt within 3-5 Days Complete -  HRI or Home Care Consult Complete -  Social Work Consult for [place] Planning/Counseling Complete -  Palliative Care Screening Not Applicable -  Medication Review Human resources officer) Complete -  Some recent data might be hidden

## 2021-06-06 NOTE — Discharge Summary (Signed)
Physician Discharge Summary   Patient: Katelyn Kennedy MRN: [id number] DOB: August 08, 1945  Admit date:     05/30/2021  Discharge date: 06/06/21  Discharge Physician: [name]   PCP: Katelyn Sanders, MD   Recommendations at discharge:    Follow up with PCP in 1-2 weeks  Discharge Diagnoses: Principal Problem:   Weakness of left side of body Active Problems:   Diet-controlled type 2 diabetes mellitus (HCC)   Vasogenic brain edema (HCC)   Non-small cell lung cancer metastatic to brain ([name])   Pulmonary embolism (HCC)   Acute deep vein thrombosis (DVT) of proximal vein of left lower extremity (HCC)   DVT (deep venous thrombosis) (HCC)   HTN (hypertension)   Left-sided weakness  Resolved Problems:   * No resolved hospital problems. *   Hospital Course: 76 y.o. 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 Presenting with worsening left side weakness and left thigh pain. Her family notes that she has become progressively more weak over these last couple of weeks. She is having difficulty performing her ADLs without assistance. It is has now become impossible to get her in and out of bed and to help her transfer. She woke her sister this morning with complaints of thigh pain this morning, so she decided to bring her to the ED for help. They deny any other aggravating or alleviating factors.    Assessment and Plan: * Weakness of left side of body - Presented with left-sided hemiparesis -MRI brain showed no change in brain metastasis involving right cerebral hemisphere, vasogenic edema and similar 1.3 cm left foot midline shift anteriorly -Admitting physician spoke to neuro-oncology, total spine MRI screen was ordered along with Decadron -MRI of total spine showed no metastasis -He recommends discharge on Decadron 4 mg twice daily; they will arrange appointment as outpatient -IV Decadron discontinued, started on Decadron 4 mg p.o.  twice daily -Insurance initially declined SNF stay. Family currently appealed denial, however that failed too. Plan d/c home with maximum HH  HTN (hypertension) - Blood pressure is stable -Continue lisinopril  DVT (deep venous thrombosis) (HCC) -Continued on anticoagulation per above  Acute deep vein thrombosis (DVT) of proximal vein of left lower extremity ([name])- (present on admission) History of DVT -Admitting provider spoke to neuro-oncology -Okay to continue Eliquis -No evidence of acute blood loss  Non-small cell lung cancer metastatic to brain [facility])- (present on admission) - Patient will follow-up with oncology as outpatient -Palliative care following  Vasogenic brain edema ([name])- (present on admission) - Seen on MRI brain -Started on Decadron 4 mg IV every 6 hours initially this visit -Later changed to Decadron 4 mg p.o. twice daily  Diet-controlled type 2 diabetes mellitus ([name]) - Started on sliding scale insulin with NovoLog -CBG has been elevated due to steroids -CBG improved after starting Lantus 10 units subcu daily -glycemic trends overall stable albeit with occalsional values in the 200's -Would cont on discharge so long as pt is continued on steroids          Consultants: Palliative Care Procedures performed:   Disposition: Home health Diet recommendation:  Carb modified diet  DISCHARGE MEDICATION: Allergies as of 06/06/2021       Reactions   Codeine Nausea Only        Medication List     TAKE these medications    amitriptyline 50 MG tablet Commonly known as: ELAVIL Take 1 tablet (50 mg total) by mouth  at bedtime.   apixaban 5 MG Tabs tablet Commonly known as: ELIQUIS Take 1 tablet (5 mg total) by mouth 2 (two) times daily.   atorvastatin 40 MG tablet Commonly known as: LIPITOR 1 tablet  po daily What changed:  how much to take how to take this when to take this additional instructions   blood glucose meter kit and supplies  Kit Dispense based on patient and insurance preference. Use up to four times daily as directed.   cholecalciferol 25 MCG (1000 UNIT) tablet Commonly known as: VITAMIN D Take 1,000 Units by mouth daily.   dexamethasone 4 MG tablet Commonly known as: DECADRON Take 1 tablet (4 mg total) by mouth every 12 (twelve) hours.   docusate sodium 100 MG capsule Commonly known as: COLACE Take 1 capsule (100 mg total) by mouth 2 (two) times daily.   insulin glargine 100 UNIT/ML Solostar Pen Commonly known as: LANTUS Inject 10 Units into the skin at bedtime.   Insulin Pen Needle 31G X 8 MM Misc 1 Device by Does not apply route daily. For use with insulin pens   levETIRAcetam 500 MG tablet Commonly known as: KEPPRA Take 1 tablet (500 mg total) by mouth 2 (two) times daily. What changed: when to take this   lisinopril 2.5 MG tablet Commonly known as: ZESTRIL Take 2.5 mg by mouth daily.   multivitamin capsule Take 1 capsule by mouth daily. Centrum Silver   oxyCODONE 5 MG immediate release tablet Commonly known as: Oxy IR/ROXICODONE Take 1 tablet (5 mg total) by mouth every 4 (four) hours as needed for moderate pain.   polyethylene glycol 17 g packet Commonly known as: MIRALAX / GLYCOLAX Take 17 g by mouth daily as needed for moderate constipation.   SYSTANE COMPLETE OP Place 1 drop into both eyes daily as needed (dry eyes).   vitamin B-12 1000 MCG tablet Commonly known as: CYANOCOBALAMIN Take 1,000 mcg by mouth daily.               Durable Medical Equipment  (From admission, onward)           Start     Ordered   06/06/21 1040  For home use only DME Hospital bed  Once       Question Answer Comment  Length of Need 6 Months   Patient has (list medical condition): metastatic lung cancer to brain   The above medical condition requires: Patient requires the ability to reposition frequently   Head must be elevated greater than: 30 degrees   Bed type Semi-electric       06/06/21 1039            Contact information for follow-up providers     Bedsole, Amy E, MD Follow up in 2 week(s).   Specialty: Family Medicine Why: Hospital follow up Contact information: [place] [address] [place], [place] Follow up.   Why: [place] nursing/physical therapy/occupational therapy/aide/social worker Contact information: [place] [address] [employer] [employer] Follow up.   Why: Hospital bed Contact information: [address] [place] [address] [phone]              Contact information for after-discharge care     Destination     [facility] Preferred SNF .   Service: Skilled Nursing Contact information: [address]  [place] [phone]                     Discharge Exam: Danley Danker Weights   05/31/21 0000 05/31/21 0033  Weight: 58.1 kg 58.1 kg   General exam: Awake, laying in bed, in nad Respiratory system: Normal respiratory effort, no wheezing Cardiovascular system: regular rate, s1, s2 Gastrointestinal system: Soft, nondistended, positive BS Central nervous system: CN2-12 grossly intact, strength intact Extremities: Perfused, no clubbing Skin: Normal skin turgor, no notable skin lesions seen Psychiatry: Mood normal // no visual hallucinations   Condition at discharge: good  The results of significant diagnostics from this hospitalization (including imaging, microbiology, ancillary and laboratory) are listed below for reference.   Imaging Studies: CT HEAD WO CONTRAST (5MM)  Result Date: 05/12/2021 CLINICAL DATA:  Metastatic disease evaluation assess for any new hemorrhagic bleed EXAM: CT HEAD WITHOUT CONTRAST TECHNIQUE: Contiguous axial images were obtained from the base of the skull through the vertex without intravenous contrast. RADIATION DOSE  REDUCTION: This exam was performed according to the departmental dose-optimization program which includes automated exposure control, adjustment of the mA and/or kV according to patient size and/or use of iterative reconstruction technique. COMPARISON:  CT head 03/31/2021. FINDINGS: Brain: Redemonstrated sequela of prior right parietal craniotomy and postsurgical change within the underlying posterior right frontal lobe. Redemonstrated multiple known hemorrhagic parenchymal testes within the bilateral frontal lobes as well as hemorrhagic dural-based metastatic disease along the falx and right cerebral convexity. Evaluation is limited by CT; however, possible interval increase in size of some of these hemorrhagic lesions, including 1.3 cm lesion along the right frontal convexity superiorly (series 2, image 20) and a 1 cm lesion along the more inferior right convexity (series 2, image 8; series 5, image 18). Additional scattered areas of small volume extra-axial hemorrhage not substantially changed by CT. There also is likely increased vasogenic edema surrounding these lesions in the right frontal lobe. Mildly increased edema in the more posterior right parietal and occipital white matter. Mild effacement of the frontal horn of the right lateral ventricle. Slight leftward midline shift is similar. Basal cisterns are patent. No hydrocephalus. Vascular: No hyperdense vessel identified. Skull: No acute fracture. Sinuses/Orbits: Frothy secretions in the left sphenoid sinus with mucosal thickening. Unremarkable visualized orbits. Other: No sizable mastoid effusions. IMPRESSION: 1. Limited evaluation by noncontrast CT with possible disease progression since December, including potential increase in size of multiple hemorrhagic metastases and suspected increased vasogenic edema in the right frontal and parieto-occipital lobes (detailed above). Slight leftward midline shift is similar. An MRI with contrast could confirm and  better evaluate. 2. Scattered suspected small volume extra-axial hemorrhage, not substantially changed. Electronically Signed   By: Margaretha Sheffield M.D.   On: 05/12/2021 12:45   CT Chest W Contrast  Result Date: 05/08/2021 CLINICAL DATA:  76 year old female with history of small cell lung cancer. Staging examination. EXAM: CT CHEST WITH CONTRAST TECHNIQUE: Multidetector CT imaging of the chest was performed during intravenous contrast administration. RADIATION DOSE REDUCTION: This exam was performed according to the departmental dose-optimization program which includes automated exposure control, adjustment of the mA and/or kV according to patient size and/or use of iterative reconstruction technique. CONTRAST:  11m OMNIPAQUE IOHEXOL 300 MG/ML  SOLN COMPARISON:  Chest CT 11/11/2020.  PET-CT 12/24/2020. FINDINGS: Cardiovascular: Filling defects are noted throughout the pulmonary arteries bilaterally, including a saddle embolus, as well as filling defects in the main pulmonary arteries bilaterally as well as segmental and  subsegmental sized branches. The majority of these appear nonocclusive, although there may be some occlusive embolus in the right lower lobe. Heart size is normal. Right ventricular diameter estimated at 39 mm. Left ventricular diameter estimated at 33 mm. RV to LV ratio of 1.18. There is no significant pericardial fluid, thickening or pericardial calcification. There is aortic atherosclerosis, as well as atherosclerosis of the great vessels of the mediastinum and the coronary arteries, including calcified atherosclerotic plaque in the left main, left anterior descending, left circumflex and right coronary arteries. Mediastinum/Nodes: No pathologically enlarged mediastinal or hilar lymph nodes. Small hiatal hernia. No axillary lymphadenopathy. Lungs/Pleura: There is again extensive mass-like architectural distortion throughout the right mid to lower lung, compatible with chronic postradiation  mass-like fibrosis where there is associated traction bronchiectasis, very similar to prior studies. No acute consolidative airspace disease. No pleural effusions. Patchy areas of mild ground-glass attenuation and septal thickening are now noted throughout the lungs bilaterally. Several small pulmonary nodules are again noted, largest of which is in the right lower lobe measuring 7 x 6 mm (unchanged). Other smaller pulmonary nodules are also evident, some of which are new compared to the prior study. Examples of this include a 4 mm solid nodule in the left lower lobe (axial image 89 of series 4), and several ill-defined ground-glass attenuation nodules such as a 5 x 8 mm ground-glass attenuation nodule in the left lower lobe (axial image 99 of series 4). 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 Upper Abdomen: Aortic atherosclerosis. Diffuse low attenuation throughout the visualized hepatic parenchyma, indicative of a background of hepatic steatosis. Musculoskeletal: Old healed fractures of the lateral aspect of the seventh and eighth ribs bilaterally are incidentally noted. There are no aggressive appearing lytic or blastic lesions noted in the visualized portions of the skeleton. IMPRESSION: 1. Positive for acute PE with CT evidence of right heart strain (RV/LV Ratio = 1.18) consistent with at least submassive (intermediate risk) PE. The presence of right heart strain has been associated with an increased risk of morbidity and mortality. Please refer to the "PE Focused" order set in EPIC. 2. Multiple new ill-defined ground-glass attenuation nodules, as well as several small solid-appearing nodules, in addition to widespread ill-defined areas of ground-glass attenuation and septal thickening in the lungs bilaterally. This is nonspecific, and could be seen in the setting of acute infection or drug  reaction. Close attention on follow-up studies is recommended to ensure stability or resolution of these findings. 3. 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 4. Aortic atherosclerosis, in addition to left main and three-vessel coronary artery disease. Assessment for potential risk factor modification, dietary therapy or pharmacologic therapy may be warranted, if clinically indicated. 5. Hepatic steatosis. Critical Value/emergent results were called by telephone at the time of interpretation on 05/08/2021 at 12:21 pm to provider [facility] , who verbally acknowledged these results. Aortic Atherosclerosis (ICD10-I70.0). Electronically Signed   By: Vinnie Langton M.D.   On: 05/08/2021 12:33   MR Brain W and Wo Contrast  Result Date: 05/30/2021 CLINICAL DATA:  Brain metastases suspected EXAM: MRI HEAD WITHOUT AND WITH CONTRAST TECHNIQUE: Multiplanar, multiecho pulse sequences of the brain and surrounding structures were obtained without and with intravenous contrast. CONTRAST:  34m GADAVIST GADOBUTROL 1 MMOL/ML IV SOLN COMPARISON:  MRI March 06, 2021. FINDINGS: Brain: Redemonstrated small resection cavity along the  high posterior right frontal lobe. Enhancement along the margins of the resection cavity is similar. No substantial change in extensive nodular enhancement throughout the right cerebral convexity, compatible with dural based metastatic disease. Some of the dural-based lesions are hemorrhagic with intrinsic T1 hyperintensity. Previously seen intra-axial tumor in the anterior right frontal lobe is decreased in size/conspicuity, probably in part from diminished/resolved hemorrhage. The extent of exuberant vasogenic edema is not substantially changed, greatest in the right anterior frontal lobe and extending across the genu of the corpus callosum. Approximally 1.3 cm of leftward midline shift anteriorly, unchanged. No evidence of acute large  vascular territory infarct or hydrocephalus. Vascular: Major arterial flow voids are maintained skull base. Skull and upper cervical spine: Right frontal craniotomy. No suspicious marrow signal. Sinuses/Orbits: Mild paranasal sinus mucosal thickening. Other: Moderate left and small right mastoid effusions. IMPRESSION: 1. No substantial change in extensive dural-based metastatic disease involving the right cerebral hemisphere with similar exuberant vasogenic edema and similar 1.3 cm of leftward midline shift anteriorly. 2. Decreased size/conspicuity of the previously seen right frontal intraparenchymal lesion, probably in part secondary to decreased/resolved associated hemorrhage. Electronically Signed   By: Margaretha Sheffield M.D.   On: 05/30/2021 12:07   EEG adult  Result Date: 05/30/2021 Greta Doom, MD     05/30/2021  5:48 PM History: 76 yo being evaluated for unlateral weakness Sedation: none Technique: This EEG was acquired with electrodes placed according to the International 10-20 electrode system (including Fp1, Fp2, F3, F4, C3, C4, P3, P4, O1, O2, T3, T4, T5, T6, A1, A2, Fz, Cz, Pz). The following electrodes were missing or displaced: none. Background: There is a posterior dominant rhythm of 8-9 Hz which is seen bilaterally. There is diffuse irregular delta and theta activity seen throughout the study, but it is more prominent in the right frontal region, and faster frequencies are attenuated on the right. Photic stimulation: Physiologic driving is not performed EEG Abnormalities: none Clinical Interpretation: This EEG is consistent with a focal right frontal cerebral dysfunciton in the seting of a generalized non-specific cerebral dysfunction(encephalopathy). There was no seizure or seizure predisposition recorded on this study. Please note that lack of epileptiform activity on EEG does not preclude the possibility of epilepsy. Roland Rack, MD [facility] [phone] If  7pm- 7am, please page neurology on call as listed in [place].   ECHOCARDIOGRAM COMPLETE  Result Date: 05/09/2021    ECHOCARDIOGRAM REPORT   Patient Name:   JAKAYLA SCHWEPPE Date of Exam: 05/09/2021 Medical Rec #:  [id number]       Height:       58.0 in Accession #:    [id number]      Weight:       134.0 lb Date of Birth:  [birth year]      BSA:          1.536 m Patient Age:    21 years        BP:           125/62 mmHg Patient Gender: F               HR:           110 bpm. Exam Location:  Inpatient Procedure: 2D Echo Indications:    Pulmonary embolism  History:        Patient has no prior history of Echocardiogram examinations.                 Signs/Symptoms:Shortness of Breath; Risk Factors:Diabetes and  Hypertension.  Sonographer:    Arlyss Gandy Referring Phys: [id number] [place]  1. Left ventricular ejection fraction, by estimation, is 60 to 65%. The left ventricle has normal function. The left ventricle has no regional wall motion abnormalities. Left ventricular diastolic parameters are consistent with Grade I diastolic dysfunction (impaired relaxation).  2. Right ventricular systolic function is normal. The right ventricular size is normal. Tricuspid regurgitation signal is inadequate for assessing PA pressure.  3. The mitral valve is normal in structure. No evidence of mitral valve regurgitation. No evidence of mitral stenosis.  4. The aortic valve is normal in structure. Aortic valve regurgitation is not visualized. No aortic stenosis is present.  5. The inferior vena cava is normal in size with greater than 50% respiratory variability, suggesting right atrial pressure of 3 mmHg. FINDINGS  Left Ventricle: Left ventricular ejection fraction, by estimation, is 60 to 65%. The left ventricle has normal function. The left ventricle has no regional wall motion abnormalities. The left ventricular internal cavity size was normal in size. There is  no left ventricular hypertrophy. Left  ventricular diastolic parameters are consistent with Grade I diastolic dysfunction (impaired relaxation). Normal left ventricular filling pressure. Right Ventricle: The right ventricular size is normal. No increase in right ventricular wall thickness. Right ventricular systolic function is normal. Tricuspid regurgitation signal is inadequate for assessing PA pressure. Left Atrium: Left atrial size was normal in size. Right Atrium: Right atrial size was normal in size. Pericardium: There is no evidence of pericardial effusion. Mitral Valve: The mitral valve is normal in structure. No evidence of mitral valve regurgitation. No evidence of mitral valve stenosis. Tricuspid Valve: The tricuspid valve is normal in structure. Tricuspid valve regurgitation is not demonstrated. No evidence of tricuspid stenosis. Aortic Valve: The aortic valve is normal in structure. Aortic valve regurgitation is not visualized. No aortic stenosis is present. Aortic valve mean gradient measures 3.0 mmHg. Aortic valve peak gradient measures 5.5 mmHg. Aortic valve area, by VTI measures 3.02 cm. Pulmonic Valve: The pulmonic valve was normal in structure. Pulmonic valve regurgitation is not visualized. No evidence of pulmonic stenosis. Aorta: The aortic root is normal in size and structure. Venous: The inferior vena cava is normal in size with greater than 50% respiratory variability, suggesting right atrial pressure of 3 mmHg. IAS/Shunts: No atrial level shunt detected by color flow Doppler.  LEFT VENTRICLE PLAX 2D LVIDd:         3.60 cm   Diastology LVIDs:         2.40 cm   LV e' medial:    6.64 cm/s LV PW:         0.80 cm   LV E/e' medial:  8.6 LV IVS:        0.80 cm   LV e' lateral:   5.77 cm/s LVOT diam:     2.00 cm   LV E/e' lateral: 9.9 LV SV:         57 LV SV Index:   37 LVOT Area:     3.14 cm  RIGHT VENTRICLE             IVC RV Basal diam:  2.40 cm     IVC diam: 1.80 cm RV Mid diam:    2.00 cm RV S prime:     15.60 cm/s TAPSE (M-mode):  1.5 cm LEFT ATRIUM             Index        RIGHT ATRIUM  Index LA diam:        3.10 cm 2.02 cm/m   RA Area:     6.78 cm LA Vol (A2C):   23.5 ml 15.30 ml/m  RA Volume:   9.44 ml  6.15 ml/m LA Vol (A4C):   22.6 ml 14.71 ml/m LA Biplane Vol: 23.4 ml 15.23 ml/m  AORTIC VALVE AV Area (Vmax):    2.82 cm AV Area (Vmean):   2.79 cm AV Area (VTI):     3.02 cm AV Vmax:           117.00 cm/s AV Vmean:          80.200 cm/s AV VTI:            0.188 m AV Peak Grad:      5.5 mmHg AV Mean Grad:      3.0 mmHg LVOT Vmax:         105.00 cm/s LVOT Vmean:        71.100 cm/s LVOT VTI:          0.181 m LVOT/AV VTI ratio: 0.96  AORTA Ao Root diam: 2.60 cm Ao Asc diam:  2.30 cm MITRAL VALVE MV Area (PHT): 5.23 cm    SHUNTS MV Decel Time: 145 msec    Systemic VTI:  0.18 m MV E velocity: 57.40 cm/s  Systemic Diam: 2.00 cm MV A velocity: 89.50 cm/s MV E/A ratio:  0.64 Fransico Him MD Electronically signed by Fransico Him MD Signature Date/Time: 05/09/2021/12:41:01 PM    Final    MR TOTAL SPINE METS SCREENING  Result Date: 05/31/2021 CLINICAL DATA:  76 year old female with extensive stage small cell lung cancer. Dural metastatic disease involving the right cerebral hemisphere. EXAM: MRI TOTAL SPINE WITHOUT AND WITH CONTRAST TECHNIQUE: Multisequence MR imaging of the spine from the cervical spine to the sacrum was performed prior to and following IV contrast administration for evaluation of spinal metastatic disease. CONTRAST:  80m GADAVIST GADOBUTROL 1 MMOL/ML IV SOLN COMPARISON:  Brain MRI 05/30/2021. PET-CT 12/24/2020 and earlier. Thyroid ultrasound 04/26/2019. Chest CT 05/08/2021. CT Abdomen and Pelvis 04/22/2017. FINDINGS: MRI CERVICAL SPINE FINDINGS Alignment: Mild straightening of cervical lordosis. No significant spondylolisthesis. Vertebrae: Visualized bone marrow signal is within normal limits. No vertebral metastatic disease or marrow edema identified. Cord: Normal. No abnormal intradural enhancement or dural  thickening. Posterior Fossa, vertebral arteries, paraspinal tissues: Cervicomedullary junction is within normal limits. Basilar cisterns remain patent. Bilateral thyroid nodules appear to be T2 hyperintense without enhancement This has been evaluated on previous imaging. (ref: J Am Coll Radiol. 2015 Feb;12(2): 143-50). Disc levels: Ordinary cervical spine degeneration not advanced for age. No significant spinal stenosis. MRI THORACIC SPINE FINDINGS Segmentation: Normal. Alignment: Mildly exaggerated thoracic kyphosis. No spondylolisthesis. Vertebrae: Intrinsic increased T1 signal in the thoracic vertebrae T6 through T11 compatible with prior radiation therapy. Above and below those levels marrow signal within normal limits. No thoracic vertebral metastatic disease identified. Cord: Normal. Capacious thoracic spinal canal. Conus medullaris appears normal below T12. No abnormal intradural enhancement. No abnormal dural thickening. Paraspinal and other soft tissues: Visible chest appears stable since last month. Negative visible abdominal viscera. Thoracic paraspinal soft tissues within normal limits. Disc levels: Normal for age. No thoracic spinal stenosis. MRI LUMBAR SPINE FINDINGS Segmentation:  Normal. Vestigial S1-S2 disc space. Alignment:  Normal lumbar lordosis. Vertebrae: Chronic degenerative endplate changes in the lower lumbar spine and at the lumbosacral junction, including chronic L4 and L5 Schmorl's nodes present in 2019. This is felt to explain  the presence of small endplate foci of decreased T1 signal and enhancement (such as L4 posteroinferior endplate series 26, image 11). No convincing lumbar or visible sacral vertebral metastasis. Conus medullaris: Normal at L1-L2. Normal cauda equina nerve roots. No abnormal intradural enhancement or dural thickening. Paraspinal and other soft tissues: Negative visible abdominal viscera. Severely distended urinary bladder, greater than 470 mL, and possibly double  that. Negative lumbar paraspinal soft tissues. Disc levels: Chronic disc and endplate degeneration U2-V2 through L5-S1. Combined with facet hypertrophy there is mild degenerative spinal stenosis at L4-L5. IMPRESSION: 1. No metastatic disease identified in the spine; suspect degenerative endplate enhancement in the lower lumbar spine. Evidence of prior radiation therapy in the mid thoracic spine. 2. Normal spinal cord and cauda equina. 3. No age advanced cervical or thoracic spine degeneration. Mild multifactorial spinal stenosis at L4-L5. 4. Marked bladder distension, query urinary retention. Electronically Signed   By: Genevie Ann M.D.   On: 05/31/2021 12:49   VAS [name] LOWER EXTREMITY VENOUS (DVT) (7a-7p)  Result Date: 05/30/2021  Lower Venous DVT Study Patient Name:  ASHLLEY BOOHER  Date of Exam:   05/30/2021 Medical Rec #: [id number]        Accession #:    [id number] Date of Birth: 11-04-1945       Patient Gender: F Patient Age:   14 years Exam Location:  [facility] Procedure:      VAS [name] LOWER EXTREMITY VENOUS (DVT) Referring Phys: Thelma Comp NANAVATI --------------------------------------------------------------------------------  Indications: Edema.  Risk Factors: Cancer. Limitations: Poor ultrasound/tissue interface. Comparison Study: 05/09/2021 - RIGHT:                   - There is no evidence of deep vein thrombosis in the lower                   extremity.                    - No cystic structure found in the popliteal fossa.                    LEFT:                   - Findings consistent with acute deep vein thrombosis                   involving the left                   popliteal vein, and left peroneal veins.                   - No cystic structure found in the popliteal fossa. Performing Technologist: Oliver Hum RVT  Examination Guidelines: A complete evaluation includes B-mode imaging, spectral Doppler, color Doppler, and power Doppler as needed of all accessible portions of each vessel.  Bilateral testing is considered an integral part of a complete examination. Limited examinations for reoccurring indications may be performed as noted. The reflux portion of the exam is performed with the patient in reverse Trendelenburg.  +-----+---------------+---------+-----------+----------+--------------+  RIGHT Compressibility Phasicity Spontaneity Properties Thrombus Aging  +-----+---------------+---------+-----------+----------+--------------+  CFV   Full            Yes       Yes                                    +-----+---------------+---------+-----------+----------+--------------+   +---------+---------------+---------+-----------+----------+--------------+  LEFT      Compressibility Phasicity Spontaneity Properties Thrombus Aging  +---------+---------------+---------+-----------+----------+--------------+  CFV       Full            Yes       Yes                                    +---------+---------------+---------+-----------+----------+--------------+  SFJ       Full                                                             +---------+---------------+---------+-----------+----------+--------------+  FV Prox   Full                                                             +---------+---------------+---------+-----------+----------+--------------+  FV Mid    Full                                                             +---------+---------------+---------+-----------+----------+--------------+  FV Distal Full                                                             +---------+---------------+---------+-----------+----------+--------------+  PFV       Full                                                             +---------+---------------+---------+-----------+----------+--------------+  POP       Partial         Yes       Yes                    Acute           +---------+---------------+---------+-----------+----------+--------------+  PTV       Full                                                              +---------+---------------+---------+-----------+----------+--------------+  PERO      Full                                                             +---------+---------------+---------+-----------+----------+--------------+  Summary: RIGHT: - No evidence of common femoral vein obstruction.  LEFT: - Findings consistent with acute deep vein thrombosis involving the left popliteal vein. - No cystic structure found in the popliteal fossa.  *See table(s) above for measurements and observations. Electronically signed by Orlie Pollen on 05/30/2021 at 6:40:32 PM.    Final    VAS [name] LOWER EXTREMITY VENOUS (DVT)  Result Date: 05/11/2021  Lower Venous DVT Study Patient Name:  BETTYLOU FREW  Date of Exam:   05/09/2021 Medical Rec #: [id number]        Accession #:    [id number] Date of Birth: Oct 17, 1945       Patient Gender: F Patient Age:   42 years Exam [facility]:  [facility] Procedure:      VAS [name] LOWER EXTREMITY VENOUS (DVT) Referring Phys: Burney Gauze --------------------------------------------------------------------------------  Indications: Pulmonary embolism.  Risk Factors: Confirmed PE. Anticoagulation: Heparin. Limitations: Patient positioning. Comparison Study: No prior studies. Performing Technologist: Oliver Hum RVT  Examination Guidelines: A complete evaluation includes B-mode imaging, spectral Doppler, color Doppler, and power Doppler as needed of all accessible portions of each vessel. Bilateral testing is considered an integral part of a complete examination. Limited examinations for reoccurring indications may be performed as noted. The reflux portion of the exam is performed with the patient in reverse Trendelenburg.  +---------+---------------+---------+-----------+----------+--------------+  RIGHT     Compressibility Phasicity Spontaneity Properties Thrombus Aging  +---------+---------------+---------+-----------+----------+--------------+  CFV       Full             Yes       Yes                                    +---------+---------------+---------+-----------+----------+--------------+  SFJ       Full                                                             +---------+---------------+---------+-----------+----------+--------------+  FV Prox   Full                                                             +---------+---------------+---------+-----------+----------+--------------+  FV Mid    Full                                                             +---------+---------------+---------+-----------+----------+--------------+  FV Distal Full                                                             +---------+---------------+---------+-----------+----------+--------------+  PFV       Full                                                             +---------+---------------+---------+-----------+----------+--------------+  POP       Full            Yes       Yes                                    +---------+---------------+---------+-----------+----------+--------------+  PTV       Full                                                             +---------+---------------+---------+-----------+----------+--------------+  PERO      Full                                                             +---------+---------------+---------+-----------+----------+--------------+   +---------+---------------+---------+-----------+----------+--------------+  LEFT      Compressibility Phasicity Spontaneity Properties Thrombus Aging  +---------+---------------+---------+-----------+----------+--------------+  CFV       Full            Yes       Yes                                    +---------+---------------+---------+-----------+----------+--------------+  SFJ       Full                                                             +---------+---------------+---------+-----------+----------+--------------+  FV Prox   Full                                                              +---------+---------------+---------+-----------+----------+--------------+  FV Mid    Full                                                             +---------+---------------+---------+-----------+----------+--------------+  FV Distal Full                                                             +---------+---------------+---------+-----------+----------+--------------+  PFV       Full                                                             +---------+---------------+---------+-----------+----------+--------------+  POP       Partial         Yes       Yes                    Acute           +---------+---------------+---------+-----------+----------+--------------+  PTV       Full                                                             +---------+---------------+---------+-----------+----------+--------------+  PERO      None                                             Acute           +---------+---------------+---------+-----------+----------+--------------+  Gastroc   Full                                                             +---------+---------------+---------+-----------+----------+--------------+     Summary: RIGHT: - There is no evidence of deep vein thrombosis in the lower extremity.  - No cystic structure found in the popliteal fossa.  LEFT: - Findings consistent with acute deep vein thrombosis involving the left popliteal vein, and left peroneal veins. - No cystic structure found in the popliteal fossa.  *See table(s) above for measurements and observations. Electronically signed by Servando Snare MD on 05/11/2021 at 9:29:17 AM.    Final     Microbiology: Results for orders placed or performed during the hospital encounter of 05/30/21  Culture, blood (Routine x 2)     Status: None   Collection Time: 05/30/21  8:59 AM   Specimen: BLOOD  Result Value Ref Range Status   Specimen Description   Final    BLOOD SITE NOT SPECIFIED Performed at [facility] [address], [place], [place] [address]    Special Requests   Final    BOTTLES DRAWN AEROBIC AND ANAEROBIC Blood Culture adequate volume Performed at [facility] [address], [place], [place] [address]    Culture   Final    NO GROWTH 5 DAYS Performed at [facility], [place] [address], [place], [place] [address]    Report Status 06/04/2021 FINAL  Final  Culture, blood (Routine x 2)     Status: None   Collection Time: 05/30/21  9:07 AM   Specimen: BLOOD  Result Value Ref Range Status   Specimen Description   Final    BLOOD RIGHT ANTECUBITAL Performed at [facility] [address] [place], [place] [address]    Special Requests   Final    BOTTLES DRAWN AEROBIC AND ANAEROBIC Blood Culture adequate volume Performed at [facility] [address], [place], [place] [address]    Culture   Final    NO GROWTH 5 DAYS Performed at [facility], [place] [address], [place],  [address]    Report Status  06/04/2021 FINAL  Final  Resp Panel by RT-PCR (Flu A&B, Covid) Nasopharyngeal Swab     Status: None   Collection Time: 05/30/21 12:06 PM   Specimen: Nasopharyngeal Swab; Nasopharyngeal(NP) swabs in vial transport medium  Result Value Ref Range Status   SARS Coronavirus 2 by RT PCR NEGATIVE NEGATIVE Final    Comment: (NOTE) SARS-CoV-2 target nucleic acids are NOT DETECTED.  The SARS-CoV-2 RNA is generally detectable in upper respiratory specimens during the acute phase of infection. The lowest concentration of SARS-CoV-2 viral copies this assay can detect is 138 copies/mL. A negative result does not preclude SARS-Cov-2 infection and should not be used as the sole basis for treatment or other patient management decisions. A negative result may occur with  improper specimen collection/handling, submission of specimen other than nasopharyngeal swab, presence of viral mutation(s) within the areas targeted by this  assay, and inadequate number of viral copies(<138 copies/mL). A negative result must be combined with clinical observations, patient history, and epidemiological information. The expected result is Negative.  Fact Sheet for Patients:  EntrepreneurPulse.com.au  Fact Sheet for Healthcare Providers:  IncredibleEmployment.be  This test is no t yet approved or cleared by the [place] FDA and  has been authorized for detection and/or diagnosis of SARS-CoV-2 by FDA under an Emergency Use Authorization (EUA). This EUA will remain  in effect (meaning this test can be used) for the duration of the COVID-19 declaration under Section 564(b)(1) of the Act, 21 U.S.C.section 360bbb-3(b)(1), unless the authorization is terminated  or revoked sooner.       Influenza A by PCR NEGATIVE NEGATIVE Final   Influenza B by PCR NEGATIVE NEGATIVE Final    Comment: (NOTE) The Xpert Xpress SARS-CoV-2/FLU/RSV plus assay is intended as an aid in the diagnosis of influenza from Nasopharyngeal swab specimens and should not be used as a sole basis for treatment. Nasal washings and aspirates are unacceptable for Xpert Xpress SARS-CoV-2/FLU/RSV testing.  Fact Sheet for Patients: EntrepreneurPulse.com.au  Fact Sheet for Healthcare Providers: IncredibleEmployment.be  This test is not yet approved or cleared by the [place] FDA and has been authorized for detection and/or diagnosis of SARS-CoV-2 by FDA under an Emergency Use Authorization (EUA). This EUA will remain in effect (meaning this test can be used) for the duration of the COVID-19 declaration under Section 564(b)(1) of the Act, 21 U.S.C. section 360bbb-3(b)(1), unless the authorization is terminated or revoked.  Performed at [facility], [place] [address], [place], [place] [address]     Labs: CBC: Recent Labs  Lab 05/31/21 0936  WBC 6.9  HGB  11.3*  HCT 35.8*  MCV 91.6  PLT 623*   Basic Metabolic Panel: Recent Labs  Lab 05/31/21 0936  NA 137  K 3.8  CL 103  CO2 24  GLUCOSE 156*  BUN 19  CREATININE 0.59  CALCIUM 9.6   Liver Function Tests: Recent Labs  Lab 05/31/21 0936  AST 27  ALT 23  ALKPHOS 127*  BILITOT 0.4  PROT 7.5  ALBUMIN 3.1*   CBG: Recent Labs  Lab 06/05/21 0737 06/05/21 1137 06/05/21 1643 06/05/21 2030 06/06/21 0735  GLUCAP 161* 248* 160* 196* 138*    Discharge time spent: less than 30 minutes.  Signed: [name], MD [facility] 06/06/2021

## 2021-06-06 NOTE — Care Management (Signed)
°  °  Durable Medical Equipment  (From admission, onward)           Start     Ordered   06/06/21 1040  For home use only DME Hospital bed  Once       Question Answer Comment  Length of Need 6 Months   Patient has (list medical condition): metastatic lung cancer to brain   The above medical condition requires: Patient requires the ability to reposition frequently   Head must be elevated greater than: 30 degrees   Bed type Semi-electric      06/06/21 1039

## 2021-06-06 NOTE — Progress Notes (Signed)
Physical Therapy Treatment Patient Details Name: MERCADIES CO MRN: [id number] DOB: Nov 12, 1945 Today's Date: 06/06/2021   History of Present Illness patient is a 76 year old female who presented to the hospital with weakness and leg pain. patient was noted to have had recent hosptialization with submassive PE with heart strain. patient was admitted with left sided weakness and HTN. PMH: stage 4 NSCLS with brain mets, L side weakness, HTN, DM.    PT Comments    Pt eager to work with PT prior to d/c home today. Pt capable of short, household-distance gait at this time with RW and multimodal safety cues. Pt continues with L inattention, most notable when PT assisting pt with UE dressing prior to gait and during gait. Pt states she will have assist of family at home, PT discussed the importance of short bouts of gait followed by rest for energy conservation. PT also took pt through LE exercises to perform when up at home to promote strengthening. PT to continue to follow.     Recommendations for follow up therapy are one component of a multi-disciplinary discharge planning process, led by the attending physician.  Recommendations may be updated based on patient status, additional functional criteria and insurance authorization.  Follow Up Recommendations  Home health PT     Assistance Recommended at Discharge Frequent or constant Supervision/Assistance  Patient can return home with the following A little help with walking and/or transfers;A little help with bathing/dressing/bathroom;Assistance with cooking/housework;Direct supervision/assist for medications management;Assist for transportation;Help with stairs or ramp for entrance;Direct supervision/assist for financial management   Equipment Recommendations  None recommended by PT    Recommendations for Other Services       Precautions / Restrictions Precautions Precautions: Fall Precaution Comments: L side weakness,  inattention Restrictions Weight Bearing Restrictions: No     Mobility  Bed Mobility Overal bed mobility: Needs Assistance             General bed mobility comments: up in recliner    Transfers Overall transfer level: Needs assistance Equipment used: Rolling walker (2 wheels) Transfers: Sit to/from Stand Sit to Stand: Min assist           General transfer comment: assist for power up, steadying upon standing, cues for hand placement when rising and sitting.    Ambulation/Gait Ambulation/Gait assistance: Min guard, Min assist Gait Distance (Feet): 50 Feet Assistive device: Rolling walker (2 wheels) Gait Pattern/deviations: Shuffle, Trunk flexed, Step-through pattern, Decreased stride length, Decreased step length - left Gait velocity: decr     General Gait Details: Cues for upright posture, increasing step length LLE, occasional physical assist for RW maneuvering especially with directional changes   Stairs             Wheelchair Mobility    Modified Rankin (Stroke Patients Only)       Balance Overall balance assessment: Needs assistance Sitting-balance support: No upper extremity supported, Feet supported Sitting balance-Leahy Scale: Good     Standing balance support: Reliant on assistive device for balance, Bilateral upper extremity supported, During functional activity Standing balance-Leahy Scale: Poor Standing balance comment: relies on BUE support                            Cognition Arousal/Alertness: Awake/alert Behavior During Therapy: WFL for tasks assessed/performed Overall Cognitive Status: No family/caregiver present to determine baseline cognitive functioning Area of Impairment: Attention, Following commands, Memory, Safety/judgement, Problem solving, Awareness  Current Attention Level: Focused   Following Commands: Follows one step commands consistently Safety/Judgement: Decreased awareness of  safety Awareness: Emergent Problem Solving: Slow processing, Requires verbal cues, Decreased initiation General Comments: pt requires step-by-step cues for all mobility and ADL tasks, continues to present with L inattention.        Exercises General Exercises - Lower Extremity Long Arc Quad: AROM, Both, 10 reps, Seated Hip Flexion/Marching: AROM, Both, 10 reps, Seated    General Comments        Pertinent Vitals/Pain Pain Assessment Pain Assessment: Faces Faces Pain Scale: No hurt Pain Intervention(s): Monitored during session    Home Living                          Prior Function            PT Goals (current goals can now be found in the care plan section) Acute Rehab PT Goals Patient Stated Goal: Regain IND PT Goal Formulation: With patient Time For Goal Achievement: 06/14/21 Potential to Achieve Goals: Fair Progress towards PT goals: Progressing toward goals    Frequency    Min 3X/week      PT Plan Current plan remains appropriate    Co-evaluation              AM-PAC PT "6 Clicks" Mobility   Outcome Measure  Help needed turning from your back to your side while in a flat bed without using bedrails?: A Little Help needed moving from lying on your back to sitting on the side of a flat bed without using bedrails?: A Little Help needed moving to and from a bed to a chair (including a wheelchair)?: A Little Help needed standing up from a chair using your arms (e.g., wheelchair or bedside chair)?: A Little Help needed to walk in hospital room?: A Little Help needed climbing 3-5 steps with a railing? : A Lot 6 Click Score: 17    End of Session   Activity Tolerance: Patient tolerated treatment well Patient left: in chair;with call bell/phone within reach;with chair alarm set Nurse Communication: Mobility status PT Visit Diagnosis: Unsteadiness on feet (R26.81);Muscle weakness (generalized) (M62.81);History of falling (Z91.81);Difficulty in  walking, not elsewhere classified (R26.2)     Time: 9728-2060 PT Time Calculation (min) (ACUTE ONLY): 20 min  Charges:  $Therapeutic Activity: 8-22 mins                     Stacie Glaze, PT DPT Acute Rehabilitation Services Pager [phone]  Office [phone]    Roxine Caddy E Ruffin Pyo 06/06/2021, 4:00 PM

## 2021-06-07 DIAGNOSIS — W19XXXD Unspecified fall, subsequent encounter: Secondary | ICD-10-CM | POA: Diagnosis not present

## 2021-06-07 DIAGNOSIS — Z86718 Personal history of other venous thrombosis and embolism: Secondary | ICD-10-CM | POA: Diagnosis not present

## 2021-06-07 DIAGNOSIS — I10 Essential (primary) hypertension: Secondary | ICD-10-CM | POA: Diagnosis not present

## 2021-06-07 DIAGNOSIS — M1990 Unspecified osteoarthritis, unspecified site: Secondary | ICD-10-CM | POA: Diagnosis not present

## 2021-06-07 DIAGNOSIS — R41841 Cognitive communication deficit: Secondary | ICD-10-CM | POA: Diagnosis not present

## 2021-06-07 DIAGNOSIS — C7931 Secondary malignant neoplasm of brain: Secondary | ICD-10-CM | POA: Diagnosis not present

## 2021-06-07 DIAGNOSIS — Z87891 Personal history of nicotine dependence: Secondary | ICD-10-CM | POA: Diagnosis not present

## 2021-06-07 DIAGNOSIS — E1151 Type 2 diabetes mellitus with diabetic peripheral angiopathy without gangrene: Secondary | ICD-10-CM | POA: Diagnosis not present

## 2021-06-07 DIAGNOSIS — S065X0D Traumatic subdural hemorrhage without loss of consciousness, subsequent encounter: Secondary | ICD-10-CM | POA: Diagnosis not present

## 2021-06-07 DIAGNOSIS — C3431 Malignant neoplasm of lower lobe, right bronchus or lung: Secondary | ICD-10-CM | POA: Diagnosis not present

## 2021-06-07 DIAGNOSIS — E785 Hyperlipidemia, unspecified: Secondary | ICD-10-CM | POA: Diagnosis not present

## 2021-06-07 DIAGNOSIS — I951 Orthostatic hypotension: Secondary | ICD-10-CM | POA: Diagnosis not present

## 2021-06-07 DIAGNOSIS — E119 Type 2 diabetes mellitus without complications: Secondary | ICD-10-CM | POA: Diagnosis not present

## 2021-06-07 DIAGNOSIS — E1169 Type 2 diabetes mellitus with other specified complication: Secondary | ICD-10-CM | POA: Diagnosis not present

## 2021-06-09 ENCOUNTER — Telehealth: Payer: Self-pay

## 2021-06-09 DIAGNOSIS — I619 Nontraumatic intracerebral hemorrhage, unspecified: Secondary | ICD-10-CM | POA: Diagnosis not present

## 2021-06-09 DIAGNOSIS — M1990 Unspecified osteoarthritis, unspecified site: Secondary | ICD-10-CM | POA: Diagnosis not present

## 2021-06-09 DIAGNOSIS — R9089 Other abnormal findings on diagnostic imaging of central nervous system: Secondary | ICD-10-CM | POA: Diagnosis not present

## 2021-06-09 DIAGNOSIS — M6281 Muscle weakness (generalized): Secondary | ICD-10-CM | POA: Diagnosis not present

## 2021-06-09 DIAGNOSIS — R262 Difficulty in walking, not elsewhere classified: Secondary | ICD-10-CM | POA: Diagnosis not present

## 2021-06-09 DIAGNOSIS — Z87891 Personal history of nicotine dependence: Secondary | ICD-10-CM | POA: Diagnosis not present

## 2021-06-09 DIAGNOSIS — E119 Type 2 diabetes mellitus without complications: Secondary | ICD-10-CM | POA: Diagnosis not present

## 2021-06-09 DIAGNOSIS — S065X0D Traumatic subdural hemorrhage without loss of consciousness, subsequent encounter: Secondary | ICD-10-CM | POA: Diagnosis not present

## 2021-06-09 DIAGNOSIS — I951 Orthostatic hypotension: Secondary | ICD-10-CM | POA: Diagnosis not present

## 2021-06-09 DIAGNOSIS — C3490 Malignant neoplasm of unspecified part of unspecified bronchus or lung: Secondary | ICD-10-CM | POA: Diagnosis not present

## 2021-06-09 DIAGNOSIS — C3431 Malignant neoplasm of lower lobe, right bronchus or lung: Secondary | ICD-10-CM | POA: Diagnosis not present

## 2021-06-09 DIAGNOSIS — W19XXXD Unspecified fall, subsequent encounter: Secondary | ICD-10-CM | POA: Diagnosis not present

## 2021-06-09 DIAGNOSIS — R41841 Cognitive communication deficit: Secondary | ICD-10-CM | POA: Diagnosis not present

## 2021-06-09 DIAGNOSIS — Z86718 Personal history of other venous thrombosis and embolism: Secondary | ICD-10-CM | POA: Diagnosis not present

## 2021-06-09 DIAGNOSIS — C7931 Secondary malignant neoplasm of brain: Secondary | ICD-10-CM | POA: Diagnosis not present

## 2021-06-09 DIAGNOSIS — I10 Essential (primary) hypertension: Secondary | ICD-10-CM | POA: Diagnosis not present

## 2021-06-09 DIAGNOSIS — E785 Hyperlipidemia, unspecified: Secondary | ICD-10-CM | POA: Diagnosis not present

## 2021-06-09 DIAGNOSIS — E1169 Type 2 diabetes mellitus with other specified complication: Secondary | ICD-10-CM | POA: Diagnosis not present

## 2021-06-09 DIAGNOSIS — E1151 Type 2 diabetes mellitus with diabetic peripheral angiopathy without gangrene: Secondary | ICD-10-CM | POA: Diagnosis not present

## 2021-06-09 DIAGNOSIS — R531 Weakness: Secondary | ICD-10-CM | POA: Diagnosis not present

## 2021-06-09 NOTE — Telephone Encounter (Signed)
TCM not needed -

## 2021-06-10 ENCOUNTER — Telehealth: Payer: Self-pay | Admitting: Family Medicine

## 2021-06-10 ENCOUNTER — Other Ambulatory Visit: Payer: Self-pay

## 2021-06-10 ENCOUNTER — Inpatient Hospital Stay: Payer: PPO | Admitting: Nurse Practitioner

## 2021-06-10 ENCOUNTER — Inpatient Hospital Stay (HOSPITAL_BASED_OUTPATIENT_CLINIC_OR_DEPARTMENT_OTHER): Payer: PPO | Admitting: Internal Medicine

## 2021-06-10 VITALS — BP 113/71 | HR 103 | Temp 97.2°F | Resp 18 | Ht <= 58 in | Wt 127.8 lb

## 2021-06-10 DIAGNOSIS — C3490 Malignant neoplasm of unspecified part of unspecified bronchus or lung: Secondary | ICD-10-CM | POA: Diagnosis not present

## 2021-06-10 DIAGNOSIS — I951 Orthostatic hypotension: Secondary | ICD-10-CM | POA: Diagnosis not present

## 2021-06-10 DIAGNOSIS — S065X0D Traumatic subdural hemorrhage without loss of consciousness, subsequent encounter: Secondary | ICD-10-CM | POA: Diagnosis not present

## 2021-06-10 DIAGNOSIS — C7931 Secondary malignant neoplasm of brain: Secondary | ICD-10-CM

## 2021-06-10 DIAGNOSIS — Z87891 Personal history of nicotine dependence: Secondary | ICD-10-CM | POA: Diagnosis not present

## 2021-06-10 DIAGNOSIS — C3431 Malignant neoplasm of lower lobe, right bronchus or lung: Secondary | ICD-10-CM | POA: Diagnosis not present

## 2021-06-10 DIAGNOSIS — E1169 Type 2 diabetes mellitus with other specified complication: Secondary | ICD-10-CM | POA: Diagnosis not present

## 2021-06-10 DIAGNOSIS — R41841 Cognitive communication deficit: Secondary | ICD-10-CM | POA: Diagnosis not present

## 2021-06-10 DIAGNOSIS — I10 Essential (primary) hypertension: Secondary | ICD-10-CM | POA: Diagnosis not present

## 2021-06-10 DIAGNOSIS — E119 Type 2 diabetes mellitus without complications: Secondary | ICD-10-CM | POA: Diagnosis not present

## 2021-06-10 DIAGNOSIS — W19XXXD Unspecified fall, subsequent encounter: Secondary | ICD-10-CM | POA: Diagnosis not present

## 2021-06-10 DIAGNOSIS — E1151 Type 2 diabetes mellitus with diabetic peripheral angiopathy without gangrene: Secondary | ICD-10-CM | POA: Diagnosis not present

## 2021-06-10 DIAGNOSIS — M1990 Unspecified osteoarthritis, unspecified site: Secondary | ICD-10-CM | POA: Diagnosis not present

## 2021-06-10 DIAGNOSIS — Z86718 Personal history of other venous thrombosis and embolism: Secondary | ICD-10-CM | POA: Diagnosis not present

## 2021-06-10 DIAGNOSIS — E785 Hyperlipidemia, unspecified: Secondary | ICD-10-CM | POA: Diagnosis not present

## 2021-06-10 MED ORDER — DEXAMETHASONE 2 MG PO TABS: ORAL_TABLET | ORAL | 0 refills | Status: AC

## 2021-06-10 NOTE — Progress Notes (Signed)
[facility] at [place], [place] [address] [phone]   Interval Evaluation  Date of Service: 06/10/21 Patient Name: Katelyn Kennedy Patient MRN: [id number] Patient DOB: 1945/10/05 Provider: Ventura Sellers, MD  Identifying Statement:  [name] is a 76 y.o. female with Non-small cell lung cancer metastatic to brain [facility]) - Plan: MR BRAIN W WO CONTRAST   Primary Cancer:  Oncologic History: Oncology History  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 ([place])  04/29/2017 Initial Diagnosis   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 ([place])   09/21/2017 -  Chemotherapy   The patient had durvalumab (IMFINZI) 600 mg in sodium chloride 0.9 % 100 mL chemo infusion, 10.5 mg/kg = 560 mg, Intravenous,  Once, 26 of 26 cycles Administration: 600 mg (09/21/2017), 600 mg (12/16/2017), 620 mg (12/30/2017), 620 mg (01/13/2018), 620 mg (01/27/2018), 620 mg (02/10/2018), 620 mg (02/23/2018), 600 mg (10/07/2017), 620 mg (03/09/2018), 600 mg (10/21/2017), 600 mg (11/04/2017), 600 mg (11/18/2017), 600 mg (12/02/2017), 620 mg (03/24/2018), 620 mg (04/05/2018), 620 mg (05/04/2018), 620 mg (04/21/2018), 620 mg (05/18/2018), 620 mg (06/01/2018), 620 mg (06/15/2018), 620 mg (06/29/2018), 620 mg (07/13/2018), 620 mg (07/27/2018), 620 mg (08/11/2018), 620 mg (08/24/2018), 620 mg (09/08/2018)   for chemotherapy treatment.     05/14/2020 Cancer Staging   Staging form: Lung, AJCC 8th Edition - Clinical: Stage IIIA (cT2a, cN2, cM0) - Signed by Curt Bears, MD on 05/14/2020     CNS Oncologic History 11/07/20: Craniotomy, resection L convexity dural based met ([place]) 12/04/20: Post-op SRS Tammi Klippel) 03/19/21: Infiltrative recurrence, completes WBRT ([place])  Interval History: Katelyn Kennedy presents today for follow up after recent hospitalization.  She is much improved since admit, now on decadron 18m twice per day.  She is doing some walking on her own,  although mainly using a walker.  Two sister in-laws live with her and help with daily tasks, like dressing, cooking, bathing.  She is independent with toileting and feeding.  Still describes some left sided weakness, fatigue, some confusion, but all improved in the past week.  Meets with Dr. MJulien Nordmanntomorrow.  H+P (05/01/21) Patient presents today for clinical follow up.  She describes improvement in left sided weakness and dysfunction since completing whole brain radiation in December.  She currently is ambulating mostly with a rolling walker.  Other care is fully independent, and she continues to live alone.  Working well with physical therapy.  No recurrence of episodes of left leg stiffening and shaking described previously.  Medications: Current Outpatient Medications on File Prior to Visit  Medication Sig Dispense Refill   amitriptyline (ELAVIL) 50 MG tablet Take 1 tablet (50 mg total) by mouth at bedtime. 90 tablet 3   apixaban (ELIQUIS) 5 MG TABS tablet Take 1 tablet (5 mg total) by mouth 2 (two) times daily. 60 tablet 2   atorvastatin (LIPITOR) 40 MG tablet 1 tablet  po daily (Patient taking differently: Take 40 mg by mouth every evening.) 90 tablet 3   blood glucose meter kit and supplies KIT Dispense based on patient and insurance preference. Use up to four times daily as directed. 1 each 0   cholecalciferol (VITAMIN D) 25 MCG (1000 UNIT) tablet Take 1,000 Units by mouth daily.     docusate sodium (COLACE) 100 MG capsule Take 1 capsule (100 mg total) by mouth 2 (two) times daily. 60 capsule 0   insulin glargine (LANTUS) 100 UNIT/ML Solostar  Pen Inject 10 Units into the skin at bedtime. 15 mL 0   Insulin Pen Needle 31G X 8 MM MISC 1 Device by Does not apply route daily. For use with insulin pens 100 each 0   levETIRAcetam (KEPPRA) 500 MG tablet Take 1 tablet (500 mg total) by mouth 2 (two) times daily. (Patient taking differently: Take 500 mg by mouth daily.) 60 tablet 3   lisinopril  (ZESTRIL) 2.5 MG tablet Take 2.5 mg by mouth daily.     Multiple Vitamin (MULTIVITAMIN) capsule Take 1 capsule by mouth daily. Centrum Silver     polyethylene glycol (MIRALAX / GLYCOLAX) 17 g packet Take 17 g by mouth daily as needed for moderate constipation. 14 each 0   Propylene Glycol (SYSTANE COMPLETE OP) Place 1 drop into both eyes daily as needed (dry eyes).     vitamin B-12 (CYANOCOBALAMIN) 1000 MCG tablet Take 1,000 mcg by mouth daily.     No current facility-administered medications on file prior to visit.    Allergies:  Allergies  Allergen Reactions   Codeine Nausea Only   Past Medical History:  Past Medical History:  Diagnosis Date   Allergic rhinitis    Arthritis    Diabetes mellitus without complication (HCC)    diet control/no meds per pt   Dyspnea    Essential hypertension 04/11/2019   History of chicken pox    History of hiatal hernia    History of radiation therapy    right lung - 06/28/2017-08/11/2017  Dr Kyung Rudd   History of radiation therapy    SRS brain - 11/29/2020-12/04/2020  Dr Jenny Reichmann [place]/Matthew Tammi Klippel   History of radiation therapy    Whole brain - 03/10/2021-03/19/2021 Dr Gery Pray   lung ca dx'd 04/2017   Lung cancer ([place])    Peripheral vascular disease ([place])    Pneumonia    PONV (postoperative nausea and vomiting)    Smoker    Past Surgical History:  Past Surgical History:  Procedure Laterality Date   ANKLE FRACTURE SURGERY Left 10 years ago   "hard to wake up from anesthesia" per pt   APPLICATION OF CRANIAL NAVIGATION Right 11/07/2020   Procedure: APPLICATION OF CRANIAL NAVIGATION;  Surgeon: Earnie Larsson, MD;  Location: [facility];  Service: Neurosurgery;  Laterality: Right;   [place]   BREAST BIOPSY Right 1978   BENIGN CYST   BREAST EXCISIONAL BIOPSY Left 2011   Benign   BREAST EXCISIONAL BIOPSY Right 1985   Benign    CATARACT EXTRACTION     CATARACT EXTRACTION W/ INTRAOCULAR LENS IMPLANT Bilateral    COLONOSCOPY   2011   CRANIOTOMY Right 11/07/2020   Procedure: Right Sided Craniotomy for Tumor;  Surgeon: Earnie Larsson, MD;  Location: [facility];  Service: Neurosurgery;  Laterality: Right;   ELECTROMAGNETIC NAVIGATION BROCHOSCOPY N/A 06/08/2017   Procedure: ELECTROMAGNETIC NAVIGATION BRONCHOSCOPY;  Surgeon: Flora Lipps, MD;  Location: [facility];  Service: Cardiopulmonary;  Laterality: N/A;   FRACTURE SURGERY     POLYPECTOMY  2011   TRANSURETHRAL RESECTION OF BLADDER TUMOR WITH MITOMYCIN-C N/A 05/04/2017   Procedure: TRANSURETHRAL RESECTION OF BLADDER TUMOR WITH gemcitabine;  Surgeon: Abbie Sons, MD;  Location: [facility];  Service: Urology;  Laterality: N/A;   TUBAL LIGATION     Social History:  Social History   Socioeconomic History   Marital status: Divorced    Spouse name: Not on file   Number of children: Not on file   Years of education:  Not on file   Highest education level: Not on file  Occupational History   Not on file  Tobacco Use   Smoking status: Former    Packs/day: 0.50    Years: 40.00    Pack years: 20.00    Types: Cigarettes    Quit date: 04/29/2017    Years since quitting: 4.1   Smokeless tobacco: Never  Vaping Use   Vaping Use: Never used  Substance and Sexual Activity   Alcohol use: Not Currently    Comment: occassionally   Drug use: No   Sexual activity: Not Currently  Other Topics Concern   Not on file  Social History Narrative   Not on file   Social Determinants of Health   Financial Resource Strain: Low Risk    Difficulty of Paying Living Expenses: Not hard at all  Food Insecurity: Not on file  Transportation Needs: Not on file  Physical Activity: Not on file  Stress: Not on file  Social Connections: Not on file  Intimate Partner Violence: Not on file   Family History:  Family History  Problem Relation Age of Onset   Diabetes Mother    Hypothyroidism Mother    Cancer Sister 51       BREAST   Breast cancer Sister    Hypothyroidism Sister    Cancer  Maternal Grandmother        COLON   Colon cancer Maternal Grandmother 86   Stomach cancer Neg Hx     Review of Systems: Constitutional: Doesn't report fevers, chills or abnormal weight loss Eyes: Doesn't report blurriness of vision Ears, nose, mouth, throat, and face: Doesn't report sore throat Respiratory: Doesn't report cough, dyspnea or wheezes Cardiovascular: Doesn't report palpitation, chest discomfort  Gastrointestinal:  Doesn't report nausea, constipation, diarrhea GU: Doesn't report incontinence Skin: Doesn't report skin rashes Neurological: Per HPI Musculoskeletal: Doesn't report joint pain Behavioral/Psych: Doesn't report anxiety  Physical Exam: Vitals:   06/10/21 1303  BP: 113/71  Pulse: (!) 103  Resp: 18  Temp: (!) 97.2 F (36.2 C)  SpO2: 97%   KPS: 70. General: Alert, cooperative, pleasant, in no acute distress Head: Normal EENT: No conjunctival injection or scleral icterus.  Lungs: Resp effort normal Cardiac: Regular rate Abdomen: Non-distended abdomen Skin: No rashes cyanosis or petechiae. Extremities: No clubbing or edema  Neurologic Exam: Mental Status: Awake, alert, attentive to examiner. Oriented to self and environment. Language is fluent with intact comprehension.  Cranial Nerves: Visual acuity is grossly normal. Visual fields are full. Extra-ocular movements intact. No ptosis. Face is symmetric Motor: Tone and bulk are normal. Left leg 4/5. Reflexes are symmetric, no pathologic reflexes present.  Sensory: Intact to light touch Gait: Hemiparetic   Labs: I have reviewed the data as listed    Component Value Date/Time   NA 137 05/31/2021 0936   K 3.8 05/31/2021 0936   CL 103 05/31/2021 0936   CO2 24 05/31/2021 0936   GLUCOSE 156 (H) 05/31/2021 0936   BUN 19 05/31/2021 0936   CREATININE 0.59 05/31/2021 0936   CREATININE 0.73 04/28/2021 1119   CALCIUM 9.6 05/31/2021 0936   PROT 7.5 05/31/2021 0936   ALBUMIN 3.1 (L) 05/31/2021 0936   AST  27 05/31/2021 0936   AST 19 04/28/2021 1119   ALT 23 05/31/2021 0936   ALT 18 04/28/2021 1119   ALKPHOS 127 (H) 05/31/2021 0936   BILITOT 0.4 05/31/2021 0936   BILITOT 0.6 04/28/2021 1119   GFRNONAA >60 05/31/2021 9983  GFRNONAA >60 04/28/2021 1119   GFRAA >60 10/31/2019 1007   Lab Results  Component Value Date   WBC 6.9 05/31/2021   NEUTROABS 7.6 05/30/2021   HGB 11.3 (L) 05/31/2021   HCT 35.8 (L) 05/31/2021   MCV 91.6 05/31/2021   PLT 434 (H) 05/31/2021    Imaging:  [facility] Clinician Interpretation: I have personally reviewed the CNS images as listed.  My interpretation, in the context of the patient's clinical presentation, is stable disease  CT HEAD WO CONTRAST (5MM)  Result Date: 05/12/2021 CLINICAL DATA:  Metastatic disease evaluation assess for any new hemorrhagic bleed EXAM: CT HEAD WITHOUT CONTRAST TECHNIQUE: Contiguous axial images were obtained from the base of the skull through the vertex without intravenous contrast. RADIATION DOSE REDUCTION: This exam was performed according to the departmental dose-optimization program which includes automated exposure control, adjustment of the mA and/or kV according to patient size and/or use of iterative reconstruction technique. COMPARISON:  CT head 03/31/2021. FINDINGS: Brain: Redemonstrated sequela of prior right parietal craniotomy and postsurgical change within the underlying posterior right frontal lobe. Redemonstrated multiple known hemorrhagic parenchymal testes within the bilateral frontal lobes as well as hemorrhagic dural-based metastatic disease along the falx and right cerebral convexity. Evaluation is limited by CT; however, possible interval increase in size of some of these hemorrhagic lesions, including 1.3 cm lesion along the right frontal convexity superiorly (series 2, image 20) and a 1 cm lesion along the more inferior right convexity (series 2, image 8; series 5, image 18). Additional scattered areas of small volume  extra-axial hemorrhage not substantially changed by CT. There also is likely increased vasogenic edema surrounding these lesions in the right frontal lobe. Mildly increased edema in the more posterior right parietal and occipital white matter. Mild effacement of the frontal horn of the right lateral ventricle. Slight leftward midline shift is similar. Basal cisterns are patent. No hydrocephalus. Vascular: No hyperdense vessel identified. Skull: No acute fracture. Sinuses/Orbits: Frothy secretions in the left sphenoid sinus with mucosal thickening. Unremarkable visualized orbits. Other: No sizable mastoid effusions. IMPRESSION: 1. Limited evaluation by noncontrast CT with possible disease progression since December, including potential increase in size of multiple hemorrhagic metastases and suspected increased vasogenic edema in the right frontal and parieto-occipital lobes (detailed above). Slight leftward midline shift is similar. An MRI with contrast could confirm and better evaluate. 2. Scattered suspected small volume extra-axial hemorrhage, not substantially changed. Electronically Signed   By: Margaretha Sheffield M.D.   On: 05/12/2021 12:45   MR Brain W and Wo Contrast  Result Date: 05/30/2021 CLINICAL DATA:  Brain metastases suspected EXAM: MRI HEAD WITHOUT AND WITH CONTRAST TECHNIQUE: Multiplanar, multiecho pulse sequences of the brain and surrounding structures were obtained without and with intravenous contrast. CONTRAST:  42m GADAVIST GADOBUTROL 1 MMOL/ML IV SOLN COMPARISON:  MRI March 06, 2021. FINDINGS: Brain: Redemonstrated small resection cavity along the high posterior right frontal lobe. Enhancement along the margins of the resection cavity is similar. No substantial change in extensive nodular enhancement throughout the right cerebral convexity, compatible with dural based metastatic disease. Some of the dural-based lesions are hemorrhagic with intrinsic T1 hyperintensity. Previously seen  intra-axial tumor in the anterior right frontal lobe is decreased in size/conspicuity, probably in part from diminished/resolved hemorrhage. The extent of exuberant vasogenic edema is not substantially changed, greatest in the right anterior frontal lobe and extending across the genu of the corpus callosum. Approximally 1.3 cm of leftward midline shift anteriorly, unchanged. No evidence of acute large vascular  territory infarct or hydrocephalus. Vascular: Major arterial flow voids are maintained skull base. Skull and upper cervical spine: Right frontal craniotomy. No suspicious marrow signal. Sinuses/Orbits: Mild paranasal sinus mucosal thickening. Other: Moderate left and small right mastoid effusions. IMPRESSION: 1. No substantial change in extensive dural-based metastatic disease involving the right cerebral hemisphere with similar exuberant vasogenic edema and similar 1.3 cm of leftward midline shift anteriorly. 2. Decreased size/conspicuity of the previously seen right frontal intraparenchymal lesion, probably in part secondary to decreased/resolved associated hemorrhage. Electronically Signed   By: Margaretha Sheffield M.D.   On: 05/30/2021 12:07   EEG adult  Result Date: 05/30/2021 Greta Doom, MD     05/30/2021  5:48 PM History: 76 yo being evaluated for unlateral weakness Sedation: none Technique: This EEG was acquired with electrodes placed according to the International 10-20 electrode system (including Fp1, Fp2, F3, F4, C3, C4, P3, P4, O1, O2, T3, T4, T5, T6, A1, A2, Fz, Cz, Pz). The following electrodes were missing or displaced: none. Background: There is a posterior dominant rhythm of 8-9 Hz which is seen bilaterally. There is diffuse irregular delta and theta activity seen throughout the study, but it is more prominent in the right frontal region, and faster frequencies are attenuated on the right. Photic stimulation: Physiologic driving is not performed EEG Abnormalities: none Clinical  Interpretation: This EEG is consistent with a focal right frontal cerebral dysfunciton in the seting of a generalized non-specific cerebral dysfunction(encephalopathy). There was no seizure or seizure predisposition recorded on this study. Please note that lack of epileptiform activity on EEG does not preclude the possibility of epilepsy. Roland Rack, MD [facility] [phone] If 7pm- 7am, please page neurology on call as listed in [place].   MR TOTAL SPINE METS SCREENING  Result Date: 05/31/2021 CLINICAL DATA:  76 year old female with extensive stage small cell lung cancer. Dural metastatic disease involving the right cerebral hemisphere. EXAM: MRI TOTAL SPINE WITHOUT AND WITH CONTRAST TECHNIQUE: Multisequence MR imaging of the spine from the cervical spine to the sacrum was performed prior to and following IV contrast administration for evaluation of spinal metastatic disease. CONTRAST:  34m GADAVIST GADOBUTROL 1 MMOL/ML IV SOLN COMPARISON:  Brain MRI 05/30/2021. PET-CT 12/24/2020 and earlier. Thyroid ultrasound 04/26/2019. Chest CT 05/08/2021. CT Abdomen and Pelvis 04/22/2017. FINDINGS: MRI CERVICAL SPINE FINDINGS Alignment: Mild straightening of cervical lordosis. No significant spondylolisthesis. Vertebrae: Visualized bone marrow signal is within normal limits. No vertebral metastatic disease or marrow edema identified. Cord: Normal. No abnormal intradural enhancement or dural thickening. Posterior Fossa, vertebral arteries, paraspinal tissues: Cervicomedullary junction is within normal limits. Basilar cisterns remain patent. Bilateral thyroid nodules appear to be T2 hyperintense without enhancement This has been evaluated on previous imaging. (ref: J Am Coll Radiol. 2015 Feb;12(2): 143-50). Disc levels: Ordinary cervical spine degeneration not advanced for age. No significant spinal stenosis. MRI THORACIC SPINE FINDINGS Segmentation: Normal. Alignment: Mildly exaggerated thoracic  kyphosis. No spondylolisthesis. Vertebrae: Intrinsic increased T1 signal in the thoracic vertebrae T6 through T11 compatible with prior radiation therapy. Above and below those levels marrow signal within normal limits. No thoracic vertebral metastatic disease identified. Cord: Normal. Capacious thoracic spinal canal. Conus medullaris appears normal below T12. No abnormal intradural enhancement. No abnormal dural thickening. Paraspinal and other soft tissues: Visible chest appears stable since last month. Negative visible abdominal viscera. Thoracic paraspinal soft tissues within normal limits. Disc levels: Normal for age. No thoracic spinal stenosis. MRI LUMBAR SPINE FINDINGS Segmentation:  Normal. Vestigial S1-S2 disc space. Alignment:  Normal lumbar lordosis. Vertebrae: Chronic degenerative endplate changes in the lower lumbar spine and at the lumbosacral junction, including chronic L4 and L5 Schmorl's nodes present in 2019. This is felt to explain the presence of small endplate foci of decreased T1 signal and enhancement (such as L4 posteroinferior endplate series 26, image 11). No convincing lumbar or visible sacral vertebral metastasis. Conus medullaris: Normal at L1-L2. Normal cauda equina nerve roots. No abnormal intradural enhancement or dural thickening. Paraspinal and other soft tissues: Negative visible abdominal viscera. Severely distended urinary bladder, greater than 470 mL, and possibly double that. Negative lumbar paraspinal soft tissues. Disc levels: Chronic disc and endplate degeneration T0-W4 through L5-S1. Combined with facet hypertrophy there is mild degenerative spinal stenosis at L4-L5. IMPRESSION: 1. No metastatic disease identified in the spine; suspect degenerative endplate enhancement in the lower lumbar spine. Evidence of prior radiation therapy in the mid thoracic spine. 2. Normal spinal cord and cauda equina. 3. No age advanced cervical or thoracic spine degeneration. Mild  multifactorial spinal stenosis at L4-L5. 4. Marked bladder distension, query urinary retention. Electronically Signed   By: Genevie Ann M.D.   On: 05/31/2021 12:49   VAS [name] LOWER EXTREMITY VENOUS (DVT) (7a-7p)  Result Date: 05/30/2021  Lower Venous DVT Study Patient Name:  SHAASIA ODLE  Date of Exam:   05/30/2021 Medical Rec #: [id number]        Accession #:    [id number] Date of Birth: 06/11/1945       Patient Gender: F Patient Age:   3 years Exam Location:  [facility] Procedure:      VAS [name] LOWER EXTREMITY VENOUS (DVT) Referring Phys: Thelma Comp NANAVATI --------------------------------------------------------------------------------  Indications: Edema.  Risk Factors: Cancer. Limitations: Poor ultrasound/tissue interface. Comparison Study: 05/09/2021 - RIGHT:                   - There is no evidence of deep vein thrombosis in the lower                   extremity.                    - No cystic structure found in the popliteal fossa.                    LEFT:                   - Findings consistent with acute deep vein thrombosis                   involving the left                   popliteal vein, and left peroneal veins.                   - No cystic structure found in the popliteal fossa. Performing Technologist: Oliver Hum RVT  Examination Guidelines: A complete evaluation includes B-mode imaging, spectral Doppler, color Doppler, and power Doppler as needed of all accessible portions of each vessel. Bilateral testing is considered an integral part of a complete examination. Limited examinations for reoccurring indications may be performed as noted. The reflux portion of the exam is performed with the patient in reverse Trendelenburg.  +-----+---------------+---------+-----------+----------+--------------+  RIGHT Compressibility Phasicity Spontaneity Properties Thrombus Aging  +-----+---------------+---------+-----------+----------+--------------+  CFV   Full            Yes  Yes                                     +-----+---------------+---------+-----------+----------+--------------+   +---------+---------------+---------+-----------+----------+--------------+  LEFT      Compressibility Phasicity Spontaneity Properties Thrombus Aging  +---------+---------------+---------+-----------+----------+--------------+  CFV       Full            Yes       Yes                                    +---------+---------------+---------+-----------+----------+--------------+  SFJ       Full                                                             +---------+---------------+---------+-----------+----------+--------------+  FV Prox   Full                                                             +---------+---------------+---------+-----------+----------+--------------+  FV Mid    Full                                                             +---------+---------------+---------+-----------+----------+--------------+  FV Distal Full                                                             +---------+---------------+---------+-----------+----------+--------------+  PFV       Full                                                             +---------+---------------+---------+-----------+----------+--------------+  POP       Partial         Yes       Yes                    Acute           +---------+---------------+---------+-----------+----------+--------------+  PTV       Full                                                             +---------+---------------+---------+-----------+----------+--------------+  PERO      Full                                                             +---------+---------------+---------+-----------+----------+--------------+  Summary: RIGHT: - No evidence of common femoral vein obstruction.  LEFT: - Findings consistent with acute deep vein thrombosis involving the left popliteal vein. - No cystic structure found in the popliteal fossa.  *See table(s) above for measurements and  observations. Electronically signed by Orlie Pollen on 05/30/2021 at 6:40:32 PM.    Final      Assessment/Plan Non-small cell lung cancer metastatic to brain [facility]) - Plan: MR BRAIN W WO CONTRAST  SERIN THORNELL is clinically stable today, close to her baseline functional status prior to recent hospitalization for left sided weakness.  MRI brain demonstrated stable findings, no visible disease affecting the spine such as LMD.  She believes decadron has helped considerably, and is tolerating the Keppra well. She is working again with PT to regain strength.   We recommended continuing Keppra 545m BID.  Decadron should decrease to 439mdaily x7 days, then 57m37mail x7 days, then 1mg15mily x7 days, then stop if tolerated.  She will continue the 10u lantus until decadron taper is completed.  Will meet with Dr. MohaJulien Nordmannorrow to review systemic treatment plans.  We appreciate the opportunity to participate in the care of SylvHADLIE GIPSON We ask that SylvMAHSA HANSERurn to clinic in 3 months following next brain MRI, or sooner as needed.  All questions were answered. The patient knows to call the clinic with any problems, questions or concerns. No barriers to learning were detected.  The total time spent in the encounter was 40 minutes and more than 50% was on counseling and review of test results   ZachVentura Sellers Medical Director of Neuro-Oncology [facility] LLCWeslBelford28/23 2:24 PM

## 2021-06-10 NOTE — Telephone Encounter (Signed)
Verbal order given to [facility] via telephone for:  Speech therapy  Reason: To address coginition   Frequency: 1 x week for 6 weeks

## 2021-06-10 NOTE — Telephone Encounter (Signed)
Home Health verbal orders Caller Name: [name] Name: Katelyn Kennedy number: [phone]  Requesting OT/PT/Skilled nursing/Social Work/Speech: speech  Reason: to address coginition  Frequency: 1w6  Please forward to [facility] pool or providers CMA

## 2021-06-11 ENCOUNTER — Telehealth: Payer: Self-pay | Admitting: Internal Medicine

## 2021-06-11 ENCOUNTER — Inpatient Hospital Stay (HOSPITAL_BASED_OUTPATIENT_CLINIC_OR_DEPARTMENT_OTHER): Payer: PPO | Admitting: Internal Medicine

## 2021-06-11 ENCOUNTER — Inpatient Hospital Stay (HOSPITAL_BASED_OUTPATIENT_CLINIC_OR_DEPARTMENT_OTHER): Payer: PPO | Admitting: Nurse Practitioner

## 2021-06-11 ENCOUNTER — Inpatient Hospital Stay: Payer: PPO | Attending: Internal Medicine

## 2021-06-11 ENCOUNTER — Encounter: Payer: Self-pay | Admitting: Nurse Practitioner

## 2021-06-11 VITALS — BP 111/59 | HR 116 | Temp 98.8°F | Resp 18 | Ht <= 58 in | Wt 127.0 lb

## 2021-06-11 DIAGNOSIS — Z79899 Other long term (current) drug therapy: Secondary | ICD-10-CM | POA: Diagnosis not present

## 2021-06-11 DIAGNOSIS — R531 Weakness: Secondary | ICD-10-CM

## 2021-06-11 DIAGNOSIS — I739 Peripheral vascular disease, unspecified: Secondary | ICD-10-CM | POA: Diagnosis not present

## 2021-06-11 DIAGNOSIS — C3490 Malignant neoplasm of unspecified part of unspecified bronchus or lung: Secondary | ICD-10-CM

## 2021-06-11 DIAGNOSIS — Z8673 Personal history of transient ischemic attack (TIA), and cerebral infarction without residual deficits: Secondary | ICD-10-CM | POA: Diagnosis not present

## 2021-06-11 DIAGNOSIS — C7931 Secondary malignant neoplasm of brain: Secondary | ICD-10-CM | POA: Diagnosis not present

## 2021-06-11 DIAGNOSIS — E119 Type 2 diabetes mellitus without complications: Secondary | ICD-10-CM | POA: Insufficient documentation

## 2021-06-11 DIAGNOSIS — Z923 Personal history of irradiation: Secondary | ICD-10-CM | POA: Diagnosis not present

## 2021-06-11 DIAGNOSIS — C3431 Malignant neoplasm of lower lobe, right bronchus or lung: Secondary | ICD-10-CM | POA: Insufficient documentation

## 2021-06-11 DIAGNOSIS — M129 Arthropathy, unspecified: Secondary | ICD-10-CM | POA: Insufficient documentation

## 2021-06-11 DIAGNOSIS — Z7189 Other specified counseling: Secondary | ICD-10-CM

## 2021-06-11 DIAGNOSIS — I10 Essential (primary) hypertension: Secondary | ICD-10-CM | POA: Diagnosis not present

## 2021-06-11 DIAGNOSIS — Z515 Encounter for palliative care: Secondary | ICD-10-CM

## 2021-06-11 DIAGNOSIS — Z86718 Personal history of other venous thrombosis and embolism: Secondary | ICD-10-CM | POA: Insufficient documentation

## 2021-06-11 DIAGNOSIS — Z7901 Long term (current) use of anticoagulants: Secondary | ICD-10-CM | POA: Insufficient documentation

## 2021-06-11 DIAGNOSIS — Z87891 Personal history of nicotine dependence: Secondary | ICD-10-CM | POA: Insufficient documentation

## 2021-06-11 LAB — CBC WITH DIFFERENTIAL (CANCER CENTER ONLY)
Abs Immature Granulocytes: 0.31 10*3/uL — ABNORMAL HIGH (ref 0.00–0.07)
Basophils Absolute: 0 10*3/uL (ref 0.0–0.1)
Basophils Relative: 0 %
Eosinophils Absolute: 0 10*3/uL (ref 0.0–0.5)
Eosinophils Relative: 0 %
HCT: 38.6 % (ref 36.0–46.0)
Hemoglobin: 12.5 g/dL (ref 12.0–15.0)
Immature Granulocytes: 2 %
Lymphocytes Relative: 10 %
Lymphs Abs: 1.4 10*3/uL (ref 0.7–4.0)
MCH: 29.3 pg (ref 26.0–34.0)
MCHC: 32.4 g/dL (ref 30.0–36.0)
MCV: 90.4 fL (ref 80.0–100.0)
Monocytes Absolute: 0.5 10*3/uL (ref 0.1–1.0)
Monocytes Relative: 4 %
Neutro Abs: 11.4 10*3/uL — ABNORMAL HIGH (ref 1.7–7.7)
Neutrophils Relative %: 84 %
Platelet Count: 339 10*3/uL (ref 150–400)
RBC: 4.27 MIL/uL (ref 3.87–5.11)
RDW: 15.8 % — ABNORMAL HIGH (ref 11.5–15.5)
WBC Count: 13.7 10*3/uL — ABNORMAL HIGH (ref 4.0–10.5)
nRBC: 0 % (ref 0.0–0.2)

## 2021-06-11 LAB — CMP (CANCER CENTER ONLY)
ALT: 39 U/L (ref 0–44)
AST: 26 U/L (ref 15–41)
Albumin: 3.3 g/dL — ABNORMAL LOW (ref 3.5–5.0)
Alkaline Phosphatase: 107 U/L (ref 38–126)
Anion gap: 8 (ref 5–15)
BUN: 31 mg/dL — ABNORMAL HIGH (ref 8–23)
CO2: 27 mmol/L (ref 22–32)
Calcium: 9.7 mg/dL (ref 8.9–10.3)
Chloride: 100 mmol/L (ref 98–111)
Creatinine: 0.85 mg/dL (ref 0.44–1.00)
GFR, Estimated: >60 mL/min (ref >60)
Glucose, Bld: 171 mg/dL — ABNORMAL HIGH (ref 70–99)
Potassium: 4.1 mmol/L (ref 3.5–5.1)
Sodium: 135 mmol/L (ref 135–145)
Total Bilirubin: 0.2 mg/dL — ABNORMAL LOW (ref 0.3–1.2)
Total Protein: 6.8 g/dL (ref 6.5–8.1)

## 2021-06-11 NOTE — Telephone Encounter (Signed)
Scheduled per 2/28 los, pt has been called and confirmed  ?

## 2021-06-11 NOTE — Progress Notes (Signed)
[place] [phone]   [phone]  OFFICE PROGRESS NOTE  [name], MD [place] [address]  DIAGNOSIS:  1) Metastatic non-small cell lung cancer, adenocarcinoma initially diagnosed as stage IIIA (T2a, N2, M0) non-small cell lung cancer, adenocarcinoma presenting with right lower lobe lung mass in addition to subcarinal lymphadenopathy and suspicious metastatic pulmonary nodules diagnosed in February 2019.  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  PRIOR THERAPY: 1) Concurrent chemoradiation with carboplatin for an AUC of 2 with paclitaxel 45 mg/m weekly. First dose given on 06/28/2017.  Status post 7 cycles.  Last dose was giving August 09, 2017 with partial response. 2) Consolidation immunotherapy with Imfinzi (Durvalumab) 10 mg/KG every 2 weeks, first dose 09/23/2017.  Status post 26 cycles. Last dose was given on Sep 08, 2018. 3) status post craniotomy with resection of right posterior frontal dural based metastasis under the care of Dr. Trenton Gammon on November 07, 2020.  CURRENT THERAPY: Observation.  INTERVAL HISTORY: Katelyn Kennedy 76 y.o. female returns to the clinic today for follow-up visit accompanied by 2 family members.  The patient is feeling fine today with no concerning complaints.  She was diagnosed her last visit with acute pulmonary embolism and the patient started treatment with Eliquis 5 mg p.o. twice daily after initial treatment in the hospital with IV heparin.  She has a history of hemorrhagic brain metastasis and there was concern about brain hemorrhage with the anticoagulation.  She had imaging studies in the hospital that showed no concerning bleeding issues.  She also had MRI of the brain on May 30, 2020 that showed no evidence of progressive brain lesion or  hemorrhage. The patient denied having any other complaints today except for fatigue and weakness.  She denied having any chest pain, shortness of breath, cough or hemoptysis.  She has no nausea, vomiting, diarrhea or constipation.  She has no headache or visual changes.  She is here today for evaluation and repeat blood work as well as recommendation regarding her condition.  MEDICAL HISTORY: Past Medical History:  Diagnosis Date   Allergic rhinitis    Arthritis    Diabetes mellitus without complication ([place])    diet control/no meds per pt   Dyspnea    Essential hypertension 04/11/2019   History of chicken pox    History of hiatal hernia    History of radiation therapy    right lung - 06/28/2017-08/11/2017  Dr Kyung Rudd   History of radiation therapy    SRS brain - 11/29/2020-12/04/2020  Dr Jenny Reichmann Moody/Matthew Tammi Klippel   History of radiation therapy    Whole brain - 03/10/2021-03/19/2021 Dr Gery Pray   lung ca dx'd 04/2017   Lung cancer ([place])    Peripheral vascular disease ([place])    Pneumonia    PONV (postoperative nausea and vomiting)    Smoker     ALLERGIES:  is allergic to codeine.  MEDICATIONS:  Current Outpatient Medications  Medication Sig Dispense Refill   amitriptyline (ELAVIL) 50 MG tablet Take 1 tablet (50 mg total) by mouth at bedtime. 90 tablet 3   apixaban (ELIQUIS) 5 MG TABS tablet Take 1 tablet (5 mg total) by mouth 2 (two) times daily. 60 tablet 2   atorvastatin (LIPITOR) 40 MG tablet 1 tablet  po daily (Patient taking differently: Take 40  mg by mouth every evening.) 90 tablet 3   blood glucose meter kit and supplies KIT Dispense based on patient and insurance preference. Use up to four times daily as directed. 1 each 0   cholecalciferol (VITAMIN D) 25 MCG (1000 UNIT) tablet Take 1,000 Units by mouth daily.     dexamethasone (DECADRON) 2 MG tablet Take 2 tablets (4 mg total) by mouth daily for 7 days, THEN 1 tablet (2 mg total) daily for 7 days, THEN 0.5 tablets (1 mg  total) daily for 7 days. 30 tablet 0   docusate sodium (COLACE) 100 MG capsule Take 1 capsule (100 mg total) by mouth 2 (two) times daily. 60 capsule 0   insulin glargine (LANTUS) 100 UNIT/ML Solostar Pen Inject 10 Units into the skin at bedtime. 15 mL 0   Insulin Pen Needle 31G X 8 MM MISC 1 Device by Does not apply route daily. For use with insulin pens 100 each 0   levETIRAcetam (KEPPRA) 500 MG tablet Take 1 tablet (500 mg total) by mouth 2 (two) times daily. (Patient taking differently: Take 500 mg by mouth daily.) 60 tablet 3   lisinopril (ZESTRIL) 2.5 MG tablet Take 2.5 mg by mouth daily.     Multiple Vitamin (MULTIVITAMIN) capsule Take 1 capsule by mouth daily. Centrum Silver     polyethylene glycol (MIRALAX / GLYCOLAX) 17 g packet Take 17 g by mouth daily as needed for moderate constipation. 14 each 0   Propylene Glycol (SYSTANE COMPLETE OP) Place 1 drop into both eyes daily as needed (dry eyes).     vitamin B-12 (CYANOCOBALAMIN) 1000 MCG tablet Take 1,000 mcg by mouth daily.     No current facility-administered medications for this visit.    SURGICAL HISTORY:  Past Surgical History:  Procedure Laterality Date   ANKLE FRACTURE SURGERY Left 10 years ago   "hard to wake up from anesthesia" per pt   APPLICATION OF CRANIAL NAVIGATION Right 11/07/2020   Procedure: APPLICATION OF CRANIAL NAVIGATION;  Surgeon: Earnie Larsson, MD;  Location: [facility];  Service: Neurosurgery;  Laterality: Right;   [place]   BREAST BIOPSY Right 1978   BENIGN CYST   BREAST EXCISIONAL BIOPSY Left 2011   Benign   BREAST EXCISIONAL BIOPSY Right 1985   Benign    CATARACT EXTRACTION     CATARACT EXTRACTION W/ INTRAOCULAR LENS IMPLANT Bilateral    COLONOSCOPY  2011   CRANIOTOMY Right 11/07/2020   Procedure: Right Sided Craniotomy for Tumor;  Surgeon: Earnie Larsson, MD;  Location: [facility];  Service: Neurosurgery;  Laterality: Right;   ELECTROMAGNETIC NAVIGATION BROCHOSCOPY N/A 06/08/2017    Procedure: ELECTROMAGNETIC NAVIGATION BRONCHOSCOPY;  Surgeon: Flora Lipps, MD;  Location: [facility];  Service: Cardiopulmonary;  Laterality: N/A;   FRACTURE SURGERY     POLYPECTOMY  2011   TRANSURETHRAL RESECTION OF BLADDER TUMOR WITH MITOMYCIN-C N/A 05/04/2017   Procedure: TRANSURETHRAL RESECTION OF BLADDER TUMOR WITH gemcitabine;  Surgeon: Abbie Sons, MD;  Location: [facility];  Service: Urology;  Laterality: N/A;   TUBAL LIGATION      REVIEW OF SYSTEMS:  A comprehensive review of systems was negative except for: Constitutional: positive for fatigue Musculoskeletal: positive for muscle weakness   PHYSICAL EXAMINATION: General appearance: alert, cooperative, fatigued, and no distress Head: Normocephalic, without obvious abnormality, atraumatic Neck: no adenopathy, no JVD, supple, symmetrical, trachea midline, and thyroid not enlarged, symmetric, no tenderness/mass/nodules Lymph nodes: Cervical, supraclavicular, and axillary nodes normal. Resp: clear to auscultation bilaterally  Back: symmetric, no curvature. ROM normal. No CVA tenderness. Cardio: regular rate and rhythm, S1, S2 normal, no murmur, click, rub or gallop GI: soft, non-tender; bowel sounds normal; no masses,  no organomegaly Extremities: extremities normal, atraumatic, no cyanosis or edema Neurologic: Alert and oriented X 3, normal strength and tone. Normal symmetric reflexes. Normal coordination and gait  ECOG PERFORMANCE STATUS: 1 - Symptomatic but completely ambulatory  There were no vitals taken for this visit.  LABORATORY DATA: Lab Results  Component Value Date   WBC 13.7 (H) 06/11/2021   HGB 12.5 06/11/2021   HCT 38.6 06/11/2021   MCV 90.4 06/11/2021   PLT 339 06/11/2021      Chemistry      Component Value Date/Time   NA 137 05/31/2021 0936   K 3.8 05/31/2021 0936   CL 103 05/31/2021 0936   CO2 24 05/31/2021 0936   BUN 19 05/31/2021 0936   CREATININE 0.59 05/31/2021 0936   CREATININE 0.73 04/28/2021  1119      Component Value Date/Time   CALCIUM 9.6 05/31/2021 0936   ALKPHOS 127 (H) 05/31/2021 0936   AST 27 05/31/2021 0936   AST 19 04/28/2021 1119   ALT 23 05/31/2021 0936   ALT 18 04/28/2021 1119   BILITOT 0.4 05/31/2021 0936   BILITOT 0.6 04/28/2021 1119       RADIOGRAPHIC STUDIES: MR Brain W and Wo Contrast  Result Date: 05/30/2021 CLINICAL DATA:  Brain metastases suspected EXAM: MRI HEAD WITHOUT AND WITH CONTRAST TECHNIQUE: Multiplanar, multiecho pulse sequences of the brain and surrounding structures were obtained without and with intravenous contrast. CONTRAST:  1m GADAVIST GADOBUTROL 1 MMOL/ML IV SOLN COMPARISON:  MRI March 06, 2021. FINDINGS: Brain: Redemonstrated small resection cavity along the high posterior right frontal lobe. Enhancement along the margins of the resection cavity is similar. No substantial change in extensive nodular enhancement throughout the right cerebral convexity, compatible with dural based metastatic disease. Some of the dural-based lesions are hemorrhagic with intrinsic T1 hyperintensity. Previously seen intra-axial tumor in the anterior right frontal lobe is decreased in size/conspicuity, probably in part from diminished/resolved hemorrhage. The extent of exuberant vasogenic edema is not substantially changed, greatest in the right anterior frontal lobe and extending across the genu of the corpus callosum. Approximally 1.3 cm of leftward midline shift anteriorly, unchanged. No evidence of acute large vascular territory infarct or hydrocephalus. Vascular: Major arterial flow voids are maintained skull base. Skull and upper cervical spine: Right frontal craniotomy. No suspicious marrow signal. Sinuses/Orbits: Mild paranasal sinus mucosal thickening. Other: Moderate left and small right mastoid effusions. IMPRESSION: 1. No substantial change in extensive dural-based metastatic disease involving the right cerebral hemisphere with similar exuberant vasogenic  edema and similar 1.3 cm of leftward midline shift anteriorly. 2. Decreased size/conspicuity of the previously seen right frontal intraparenchymal lesion, probably in part secondary to decreased/resolved associated hemorrhage. Electronically Signed   By: FMargaretha SheffieldM.D.   On: 05/30/2021 12:07   EEG adult  Result Date: 05/30/2021 KGreta Doom MD     05/30/2021  5:48 PM History: 76yo being evaluated for unlateral weakness Sedation: none Technique: This EEG was acquired with electrodes placed according to the International 10-20 electrode system (including Fp1, Fp2, F3, F4, C3, C4, P3, P4, O1, O2, T3, T4, T5, T6, A1, A2, Fz, Cz, Pz). The following electrodes were missing or displaced: none. Background: There is a posterior dominant rhythm of 8-9 Hz which is seen bilaterally. There is diffuse irregular delta and theta  activity seen throughout the study, but it is more prominent in the right frontal region, and faster frequencies are attenuated on the right. Photic stimulation: Physiologic driving is not performed EEG Abnormalities: none Clinical Interpretation: This EEG is consistent with a focal right frontal cerebral dysfunciton in the seting of a generalized non-specific cerebral dysfunction(encephalopathy). There was no seizure or seizure predisposition recorded on this study. Please note that lack of epileptiform activity on EEG does not preclude the possibility of epilepsy. Roland Rack, MD [facility] [phone] If 7pm- 7am, please page neurology on call as listed in [place].   MR TOTAL SPINE METS SCREENING  Result Date: 05/31/2021 CLINICAL DATA:  76 year old female with extensive stage small cell lung cancer. Dural metastatic disease involving the right cerebral hemisphere. EXAM: MRI TOTAL SPINE WITHOUT AND WITH CONTRAST TECHNIQUE: Multisequence MR imaging of the spine from the cervical spine to the sacrum was performed prior to and following IV contrast  administration for evaluation of spinal metastatic disease. CONTRAST:  103m GADAVIST GADOBUTROL 1 MMOL/ML IV SOLN COMPARISON:  Brain MRI 05/30/2021. PET-CT 12/24/2020 and earlier. Thyroid ultrasound 04/26/2019. Chest CT 05/08/2021. CT Abdomen and Pelvis 04/22/2017. FINDINGS: MRI CERVICAL SPINE FINDINGS Alignment: Mild straightening of cervical lordosis. No significant spondylolisthesis. Vertebrae: Visualized bone marrow signal is within normal limits. No vertebral metastatic disease or marrow edema identified. Cord: Normal. No abnormal intradural enhancement or dural thickening. Posterior Fossa, vertebral arteries, paraspinal tissues: Cervicomedullary junction is within normal limits. Basilar cisterns remain patent. Bilateral thyroid nodules appear to be T2 hyperintense without enhancement This has been evaluated on previous imaging. (ref: J Am Coll Radiol. 2015 Feb;12(2): 143-50). Disc levels: Ordinary cervical spine degeneration not advanced for age. No significant spinal stenosis. MRI THORACIC SPINE FINDINGS Segmentation: Normal. Alignment: Mildly exaggerated thoracic kyphosis. No spondylolisthesis. Vertebrae: Intrinsic increased T1 signal in the thoracic vertebrae T6 through T11 compatible with prior radiation therapy. Above and below those levels marrow signal within normal limits. No thoracic vertebral metastatic disease identified. Cord: Normal. Capacious thoracic spinal canal. Conus medullaris appears normal below T12. No abnormal intradural enhancement. No abnormal dural thickening. Paraspinal and other soft tissues: Visible chest appears stable since last month. Negative visible abdominal viscera. Thoracic paraspinal soft tissues within normal limits. Disc levels: Normal for age. No thoracic spinal stenosis. MRI LUMBAR SPINE FINDINGS Segmentation:  Normal. Vestigial S1-S2 disc space. Alignment:  Normal lumbar lordosis. Vertebrae: Chronic degenerative endplate changes in the lower lumbar spine and at the  lumbosacral junction, including chronic L4 and L5 Schmorl's nodes present in 2019. This is felt to explain the presence of small endplate foci of decreased T1 signal and enhancement (such as L4 posteroinferior endplate series 26, image 11). No convincing lumbar or visible sacral vertebral metastasis. Conus medullaris: Normal at L1-L2. Normal cauda equina nerve roots. No abnormal intradural enhancement or dural thickening. Paraspinal and other soft tissues: Negative visible abdominal viscera. Severely distended urinary bladder, greater than 470 mL, and possibly double that. Negative lumbar paraspinal soft tissues. Disc levels: Chronic disc and endplate degeneration LO1-Y2through L5-S1. Combined with facet hypertrophy there is mild degenerative spinal stenosis at L4-L5. IMPRESSION: 1. No metastatic disease identified in the spine; suspect degenerative endplate enhancement in the lower lumbar spine. Evidence of prior radiation therapy in the mid thoracic spine. 2. Normal spinal cord and cauda equina. 3. No age advanced cervical or thoracic spine degeneration. Mild multifactorial spinal stenosis at L4-L5. 4. Marked bladder distension, query urinary retention. Electronically Signed   By: HHerminio HeadsD.  On: 05/31/2021 12:49   VAS [name] LOWER EXTREMITY VENOUS (DVT) (7a-7p)  Result Date: 05/30/2021  Lower Venous DVT Study Patient Name:  NALIA HONEYCUTT  Date of Exam:   05/30/2021 Medical Rec #: [id number]        Accession #:    [id number] Date of Birth: [birth year]       Patient Gender: F Patient Age:   76 years Exam Location:  [facility] Procedure:      VAS [name] LOWER EXTREMITY VENOUS (DVT) Referring Phys: Thelma Comp NANAVATI --------------------------------------------------------------------------------  Indications: Edema.  Risk Factors: Cancer. Limitations: Poor ultrasound/tissue interface. Comparison Study: 05/09/2021 - RIGHT:                   - There is no evidence of deep vein thrombosis in the lower                    extremity.                    - No cystic structure found in the popliteal fossa.                    LEFT:                   - Findings consistent with acute deep vein thrombosis                   involving the left                   popliteal vein, and left peroneal veins.                   - No cystic structure found in the popliteal fossa. Performing Technologist: Oliver Hum RVT  Examination Guidelines: A complete evaluation includes B-mode imaging, spectral Doppler, color Doppler, and power Doppler as needed of all accessible portions of each vessel. Bilateral testing is considered an integral part of a complete examination. Limited examinations for reoccurring indications may be performed as noted. The reflux portion of the exam is performed with the patient in reverse Trendelenburg.  +-----+---------------+---------+-----------+----------+--------------+  RIGHT Compressibility Phasicity Spontaneity Properties Thrombus Aging  +-----+---------------+---------+-----------+----------+--------------+  CFV   Full            Yes       Yes                                    +-----+---------------+---------+-----------+----------+--------------+   +---------+---------------+---------+-----------+----------+--------------+  LEFT      Compressibility Phasicity Spontaneity Properties Thrombus Aging  +---------+---------------+---------+-----------+----------+--------------+  CFV       Full            Yes       Yes                                    +---------+---------------+---------+-----------+----------+--------------+  SFJ       Full                                                             +---------+---------------+---------+-----------+----------+--------------+  FV Prox   Full                                                             +---------+---------------+---------+-----------+----------+--------------+  FV Mid    Full                                                              +---------+---------------+---------+-----------+----------+--------------+  FV Distal Full                                                             +---------+---------------+---------+-----------+----------+--------------+  PFV       Full                                                             +---------+---------------+---------+-----------+----------+--------------+  POP       Partial         Yes       Yes                    Acute           +---------+---------------+---------+-----------+----------+--------------+  PTV       Full                                                             +---------+---------------+---------+-----------+----------+--------------+  PERO      Full                                                             +---------+---------------+---------+-----------+----------+--------------+     Summary: RIGHT: - No evidence of common femoral vein obstruction.  LEFT: - Findings consistent with acute deep vein thrombosis involving the left popliteal vein. - No cystic structure found in the popliteal fossa.  *See table(s) above for measurements and observations. Electronically signed by Orlie Pollen on 05/30/2021 at 6:40:32 PM.    Final      ASSESSMENT AND PLAN: This is a very pleasant 76 year old white female with now metastatic non-small cell lung cancer, adenocarcinoma initially diagnosed as stage IIIA non-small cell lung cancer, adenocarcinoma presented mainly with right lower lobe lung mass in addition to subcarinal lymphadenopathy but there was suspicious metastatic pulmonary nodules.  She has metastatic disease to the brain in July 2022. The patient completed a course of concurrent chemoradiation with weekly carboplatin and paclitaxel.  She tolerated her treatment well with no concerning complaints. The patient recently completed consolidation treatment with immunotherapy with Imfinzi (Durvalumab). She is status post 26 cycles. She tolerated it well without any adverse  effects.  The patient underwent craniotomy and surgical resection of the solitary brain metastasis under the care of Dr. Trenton Gammon and the final pathology was consistent with  metastatic adenocarcinoma of the lung. She had SRS to the resection cavity of the brain tumor. The patient also had multiple brain metastasis in November 2022 treated with whole brain radiation.  Some of the brain metastasis were hemorrhagic in nature.  Last CT scan of the head without contrast on March 31, 2021 showed improvement in the hemorrhagic lesions. Her last CT scan of the chest showed evidence for acute pulmonary embolism and the patient was started on treatment initially with IV heparin in the hospital switch her to Eliquis 5 mg p.o. twice daily. Her imaging studies of the brain including recent MRI of the brain on May 30, 2021 showed no disease progression in the brain or hemorrhage. I recommended for the patient to continue her current treatment with Eliquis 5 mg p.o. twice daily for now. I will see the patient back for follow-up visit in 3 months for evaluation with repeat CT scan of the chest, abdomen and pelvis for restaging of her disease. The patient was advised to call immediately if she has any other concerning symptoms in the interval.  All questions were answered. The patient knows to call the clinic with any problems, questions or concerns. We can certainly see the patient much sooner if necessary.  Disclaimer: This note was dictated with voice recognition software. Similar sounding words can inadvertently be transcribed and may not be corrected upon review.

## 2021-06-11 NOTE — Progress Notes (Signed)
? ?  ?Palliative Medicine ?[place]  ?[phone] [phone] ? ? ?Name: Katelyn Kennedy ?Date: 06/11/2021 ?MRN: [id number]  ?DOB: Feb 07, 1946 ? ?Patient Care Team: ?[name], MD as PCP - General ?[name], [facility] as Pharmacist (Pharmacist) ?Gery Pray, MD as Consulting Physician (Radiation Oncology) ?Pickenpack-Cousar, Carlena Sax, NP as Nurse Practitioner (Nurse Practitioner)  ? ?INTERVAL HISTORY: ?Katelyn Kennedy is a 76 y.o. 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  She was admitted on 03/05/2021 from home with worsening left-sided weakness. Discharged to SNF rehab. CT of head showed progression of metastatic lung cancer with multiple hemorrhagic lesions and vasogenic edema.  Patient followed by Dr. Sondra Come ([place]) and Dr. Julien Nordmann. Recently completed whole brain radiation. Patient was recently admitted on 05/08/21 where she was found to have a submassive PE with right heart strain and new lung nodules. She was discharged on Eliquis s/p heparin drip.  ? ?SOCIAL HISTORY:    ? reports that she quit smoking about 4 years ago. Her smoking use included cigarettes. She has a 20.00 pack-year smoking history. She has never used smokeless tobacco. She reports that she does not currently use alcohol. She reports that she does not use drugs. ? ?ADVANCE DIRECTIVES:  ?Completed during recent hospitalization. Patient named her sister [name] as her primary medical decision maker given her daughters both live out of state. ? ?CODE STATUS: DNR ? ?PAST MEDICAL HISTORY: ?Past Medical History:  ?Diagnosis Date  ? Allergic rhinitis   ? Arthritis   ? Diabetes mellitus without complication ([place])   ? diet control/no meds per pt  ? Dyspnea   ? Essential hypertension 04/11/2019  ? History of chicken pox   ? History of hiatal hernia   ?  History of radiation therapy   ? right lung - 06/28/2017-08/11/2017  Dr Kyung Rudd  ? History of radiation therapy   ? SRS brain - 11/29/2020-12/04/2020  Dr Jenny Reichmann Moody/Matthew Tammi Klippel  ? History of radiation therapy   ? Whole brain - 03/10/2021-03/19/2021 Dr Gery Pray  ? lung ca dx'd 04/2017  ? Lung cancer ([place])   ? Peripheral vascular disease ([place])   ? Pneumonia   ? PONV (postoperative nausea and vomiting)   ? Smoker   ? ? ?ALLERGIES:  is allergic to codeine. ? ?MEDICATIONS:  ?Current Outpatient Medications  ?Medication Sig Dispense Refill  ? amitriptyline (ELAVIL) 50 MG tablet Take 1 tablet (50 mg total) by mouth at bedtime. 90 tablet 3  ? apixaban (ELIQUIS) 5 MG TABS tablet Take 1 tablet (5 mg total) by mouth 2 (two) times daily. 60 tablet 2  ? atorvastatin (LIPITOR) 40 MG tablet 1 tablet  po daily (Patient taking differently: Take 40 mg by mouth every evening.) 90 tablet 3  ? blood glucose meter kit and supplies KIT Dispense based on patient and insurance preference. Use up to four times daily as directed. 1 each 0  ? cholecalciferol (VITAMIN D) 25 MCG (1000 UNIT) tablet Take 1,000 Units by mouth daily.    ? dexamethasone (DECADRON) 2 MG tablet Take 2 tablets (4 mg total) by mouth daily for 7 days, THEN 1 tablet (2 mg total) daily for 7 days, THEN 0.5 tablets (1 mg total) daily for 7 days. 30 tablet 0  ? docusate sodium (COLACE) 100 MG capsule Take 1 capsule (100 mg total) by mouth 2 (two) times daily.  60 capsule 0  ? insulin glargine (LANTUS) 100 UNIT/ML Solostar Pen Inject 10 Units into the skin at bedtime. 15 mL 0  ? Insulin Pen Needle 31G X 8 MM MISC 1 Device by Does not apply route daily. For use with insulin pens 100 each 0  ? levETIRAcetam (KEPPRA) 500 MG tablet Take 1 tablet (500 mg total) by mouth 2 (two) times daily. (Patient taking differently: Take 500 mg by mouth daily.) 60 tablet 3  ? lisinopril (ZESTRIL) 2.5 MG tablet Take 2.5 mg by mouth daily.    ? Multiple Vitamin (MULTIVITAMIN) capsule Take 1  capsule by mouth daily. Centrum Silver    ? polyethylene glycol (MIRALAX / GLYCOLAX) 17 g packet Take 17 g by mouth daily as needed for moderate constipation. 14 each 0  ? Propylene Glycol (SYSTANE COMPLETE OP) Place 1 drop into both eyes daily as needed (dry eyes).    ? vitamin B-12 (CYANOCOBALAMIN) 1000 MCG tablet Take 1,000 mcg by mouth daily.    ? ?No current facility-administered medications for this visit.  ? ? ?VITAL SIGNS: ?BP (!) 111/59 (BP Location: Left Arm, Patient Position: Sitting)   Pulse (!) 116   Temp 98.8 ?F (37.1 ?C) (Oral)   Resp 18   Ht $R'4\' 10"'dT$  (1.473 m)   Wt 127 lb (57.6 kg)   SpO2 100%   BMI 26.54 kg/m?  ?Filed Weights  ? 06/11/21 1504  ?Weight: 127 lb (57.6 kg)  ?  ?Estimated body mass index is 26.54 kg/m? as calculated from the following: ?  Height as of this encounter: $RemoveBeforeD'4\' 10"'kmHGNJodKSnEJH$  (1.473 m). ?  Weight as of this encounter: 127 lb (57.6 kg). ? ? ?PERFORMANCE STATUS (ECOG) : 2 - Symptomatic, <50% confined to bed ? ?IMPRESSION: ? ?[name] presents to office today for follow-up. She was recently discharged from [facility] where she was admitted due to increased weakness and some confusion. She is much improved. Sitting up in wheelchair. She is accompanied by her 2 sisters-in-law/caregivers.  ? ?Patient reports she is doing much better since returning home.  She is able to assist with some ADLs with minimum to 1 person assistance.  Continues with physical therapy twice a week.  She feels her strength and her energy is returning back to her baseline.  Appetite remains good which she contributes to the use of steroids.  She is alert and oriented.  Laughing and joking expressing appreciation of how much better she is doing and feeling.  Family continues to come to the home daily and stay with her overnight for ongoing support and ensuring her safety.  ? ?We discussed continuing to take things day by day.  Patient and family verbalized agreement expressing they are remaining hopeful  for her continued and ongoing improvement/stability.  Family is appreciative of her ongoing support.  We will continue to closely monitor ensuring patient's quality of life is sufficient to her wishes. ? ?Denies pain, shortness of breath, nausea or vomiting.  Endorses some weakness but with improvement.  Appetite is good.  No concerns with constipation.  No symptom management needs at this time. ? ?I discussed the importance of continued conversation with family and their medical providers regarding overall plan of care and treatment options, ensuring decisions are within the context of the patients values and GOCs. ? ?PLAN: ?Cayla is much improved since her recent hospitalization.  Patient and family appreciative as patient is near baseline.  We will continue to take things day by day as they  remain hopeful for continued stability.   ?No symptom management needs at this time.  Patient currently taking dexamethasone.   ?I will plan to have phone visit follow-up in 2-3 weeks. ? ?Patient expressed understanding and was in agreement with this plan. She also understands that She can call the clinic at any time with any questions, concerns, or complaints.  ? ?Time Total: 30 min.  ? ?Visit consisted of counseling and education dealing with the complex and emotionally intense issues of symptom management and palliative care in the setting of serious and potentially life-threatening illness.Greater than 50%  of this time was spent counseling and coordinating care related to the above assessment and plan. ? ?Alda Lea, AGPCNP-BC  ?[place] ? ? ?  ?

## 2021-06-12 ENCOUNTER — Telehealth: Payer: Self-pay | Admitting: Nurse Practitioner

## 2021-06-12 NOTE — Telephone Encounter (Signed)
Scheduled per 3/1 los, pt has been called and niece confirmed appt ?

## 2021-06-13 ENCOUNTER — Other Ambulatory Visit: Payer: PPO

## 2021-06-13 DIAGNOSIS — S065X0D Traumatic subdural hemorrhage without loss of consciousness, subsequent encounter: Secondary | ICD-10-CM | POA: Diagnosis not present

## 2021-06-13 DIAGNOSIS — W19XXXD Unspecified fall, subsequent encounter: Secondary | ICD-10-CM | POA: Diagnosis not present

## 2021-06-13 DIAGNOSIS — C7931 Secondary malignant neoplasm of brain: Secondary | ICD-10-CM | POA: Diagnosis not present

## 2021-06-13 DIAGNOSIS — I951 Orthostatic hypotension: Secondary | ICD-10-CM | POA: Diagnosis not present

## 2021-06-13 DIAGNOSIS — E1151 Type 2 diabetes mellitus with diabetic peripheral angiopathy without gangrene: Secondary | ICD-10-CM | POA: Diagnosis not present

## 2021-06-13 DIAGNOSIS — E119 Type 2 diabetes mellitus without complications: Secondary | ICD-10-CM | POA: Diagnosis not present

## 2021-06-13 DIAGNOSIS — M1990 Unspecified osteoarthritis, unspecified site: Secondary | ICD-10-CM | POA: Diagnosis not present

## 2021-06-13 DIAGNOSIS — E1169 Type 2 diabetes mellitus with other specified complication: Secondary | ICD-10-CM | POA: Diagnosis not present

## 2021-06-13 DIAGNOSIS — E785 Hyperlipidemia, unspecified: Secondary | ICD-10-CM | POA: Diagnosis not present

## 2021-06-13 DIAGNOSIS — Z87891 Personal history of nicotine dependence: Secondary | ICD-10-CM | POA: Diagnosis not present

## 2021-06-13 DIAGNOSIS — R41841 Cognitive communication deficit: Secondary | ICD-10-CM | POA: Diagnosis not present

## 2021-06-13 DIAGNOSIS — I10 Essential (primary) hypertension: Secondary | ICD-10-CM | POA: Diagnosis not present

## 2021-06-13 DIAGNOSIS — Z86718 Personal history of other venous thrombosis and embolism: Secondary | ICD-10-CM | POA: Diagnosis not present

## 2021-06-13 DIAGNOSIS — C3431 Malignant neoplasm of lower lobe, right bronchus or lung: Secondary | ICD-10-CM | POA: Diagnosis not present

## 2021-06-16 ENCOUNTER — Inpatient Hospital Stay: Payer: PPO

## 2021-06-16 ENCOUNTER — Inpatient Hospital Stay: Payer: PPO | Admitting: Internal Medicine

## 2021-06-16 ENCOUNTER — Inpatient Hospital Stay: Payer: PPO | Admitting: Nurse Practitioner

## 2021-06-16 DIAGNOSIS — W19XXXD Unspecified fall, subsequent encounter: Secondary | ICD-10-CM | POA: Diagnosis not present

## 2021-06-16 DIAGNOSIS — E119 Type 2 diabetes mellitus without complications: Secondary | ICD-10-CM | POA: Diagnosis not present

## 2021-06-16 DIAGNOSIS — C7931 Secondary malignant neoplasm of brain: Secondary | ICD-10-CM | POA: Diagnosis not present

## 2021-06-16 DIAGNOSIS — E1151 Type 2 diabetes mellitus with diabetic peripheral angiopathy without gangrene: Secondary | ICD-10-CM | POA: Diagnosis not present

## 2021-06-16 DIAGNOSIS — R41841 Cognitive communication deficit: Secondary | ICD-10-CM | POA: Diagnosis not present

## 2021-06-16 DIAGNOSIS — I951 Orthostatic hypotension: Secondary | ICD-10-CM | POA: Diagnosis not present

## 2021-06-16 DIAGNOSIS — S065X0D Traumatic subdural hemorrhage without loss of consciousness, subsequent encounter: Secondary | ICD-10-CM | POA: Diagnosis not present

## 2021-06-16 DIAGNOSIS — C3431 Malignant neoplasm of lower lobe, right bronchus or lung: Secondary | ICD-10-CM | POA: Diagnosis not present

## 2021-06-16 DIAGNOSIS — M1990 Unspecified osteoarthritis, unspecified site: Secondary | ICD-10-CM | POA: Diagnosis not present

## 2021-06-16 DIAGNOSIS — I10 Essential (primary) hypertension: Secondary | ICD-10-CM | POA: Diagnosis not present

## 2021-06-16 DIAGNOSIS — Z87891 Personal history of nicotine dependence: Secondary | ICD-10-CM | POA: Diagnosis not present

## 2021-06-16 DIAGNOSIS — E1169 Type 2 diabetes mellitus with other specified complication: Secondary | ICD-10-CM | POA: Diagnosis not present

## 2021-06-16 DIAGNOSIS — E785 Hyperlipidemia, unspecified: Secondary | ICD-10-CM | POA: Diagnosis not present

## 2021-06-16 DIAGNOSIS — Z86718 Personal history of other venous thrombosis and embolism: Secondary | ICD-10-CM | POA: Diagnosis not present

## 2021-06-17 DIAGNOSIS — C7931 Secondary malignant neoplasm of brain: Secondary | ICD-10-CM | POA: Diagnosis not present

## 2021-06-17 DIAGNOSIS — Z515 Encounter for palliative care: Secondary | ICD-10-CM | POA: Diagnosis not present

## 2021-06-17 DIAGNOSIS — C3490 Malignant neoplasm of unspecified part of unspecified bronchus or lung: Secondary | ICD-10-CM | POA: Diagnosis not present

## 2021-06-18 ENCOUNTER — Telehealth: Payer: Self-pay | Admitting: Family Medicine

## 2021-06-18 DIAGNOSIS — R5383 Other fatigue: Secondary | ICD-10-CM | POA: Diagnosis not present

## 2021-06-18 DIAGNOSIS — Z7952 Long term (current) use of systemic steroids: Secondary | ICD-10-CM | POA: Diagnosis not present

## 2021-06-18 DIAGNOSIS — E1151 Type 2 diabetes mellitus with diabetic peripheral angiopathy without gangrene: Secondary | ICD-10-CM | POA: Diagnosis not present

## 2021-06-18 DIAGNOSIS — E1169 Type 2 diabetes mellitus with other specified complication: Secondary | ICD-10-CM | POA: Diagnosis not present

## 2021-06-18 DIAGNOSIS — R41841 Cognitive communication deficit: Secondary | ICD-10-CM | POA: Diagnosis not present

## 2021-06-18 DIAGNOSIS — Z7901 Long term (current) use of anticoagulants: Secondary | ICD-10-CM | POA: Diagnosis not present

## 2021-06-18 DIAGNOSIS — I10 Essential (primary) hypertension: Secondary | ICD-10-CM | POA: Diagnosis not present

## 2021-06-18 DIAGNOSIS — E119 Type 2 diabetes mellitus without complications: Secondary | ICD-10-CM | POA: Diagnosis not present

## 2021-06-18 DIAGNOSIS — C7931 Secondary malignant neoplasm of brain: Secondary | ICD-10-CM | POA: Diagnosis not present

## 2021-06-18 DIAGNOSIS — K760 Fatty (change of) liver, not elsewhere classified: Secondary | ICD-10-CM | POA: Diagnosis not present

## 2021-06-18 DIAGNOSIS — M1990 Unspecified osteoarthritis, unspecified site: Secondary | ICD-10-CM | POA: Diagnosis not present

## 2021-06-18 DIAGNOSIS — E785 Hyperlipidemia, unspecified: Secondary | ICD-10-CM | POA: Diagnosis not present

## 2021-06-18 DIAGNOSIS — I2699 Other pulmonary embolism without acute cor pulmonale: Secondary | ICD-10-CM | POA: Diagnosis not present

## 2021-06-18 DIAGNOSIS — I2510 Atherosclerotic heart disease of native coronary artery without angina pectoris: Secondary | ICD-10-CM | POA: Diagnosis not present

## 2021-06-18 DIAGNOSIS — I951 Orthostatic hypotension: Secondary | ICD-10-CM | POA: Diagnosis not present

## 2021-06-18 DIAGNOSIS — M5030 Other cervical disc degeneration, unspecified cervical region: Secondary | ICD-10-CM | POA: Diagnosis not present

## 2021-06-18 DIAGNOSIS — Z87891 Personal history of nicotine dependence: Secondary | ICD-10-CM | POA: Diagnosis not present

## 2021-06-18 DIAGNOSIS — Z9181 History of falling: Secondary | ICD-10-CM | POA: Diagnosis not present

## 2021-06-18 DIAGNOSIS — R531 Weakness: Secondary | ICD-10-CM | POA: Diagnosis not present

## 2021-06-18 DIAGNOSIS — I82432 Acute embolism and thrombosis of left popliteal vein: Secondary | ICD-10-CM | POA: Diagnosis not present

## 2021-06-18 DIAGNOSIS — S065X0D Traumatic subdural hemorrhage without loss of consciousness, subsequent encounter: Secondary | ICD-10-CM | POA: Diagnosis not present

## 2021-06-18 DIAGNOSIS — I119 Hypertensive heart disease without heart failure: Secondary | ICD-10-CM | POA: Diagnosis not present

## 2021-06-18 DIAGNOSIS — W19XXXD Unspecified fall, subsequent encounter: Secondary | ICD-10-CM | POA: Diagnosis not present

## 2021-06-18 DIAGNOSIS — Z794 Long term (current) use of insulin: Secondary | ICD-10-CM | POA: Diagnosis not present

## 2021-06-18 DIAGNOSIS — Z86718 Personal history of other venous thrombosis and embolism: Secondary | ICD-10-CM | POA: Diagnosis not present

## 2021-06-18 DIAGNOSIS — I700 Atherosclerosis of aorta: Secondary | ICD-10-CM | POA: Diagnosis not present

## 2021-06-18 DIAGNOSIS — C3431 Malignant neoplasm of lower lobe, right bronchus or lung: Secondary | ICD-10-CM | POA: Diagnosis not present

## 2021-06-18 NOTE — Telephone Encounter (Signed)
Katelyn Kennedy from [facility] has called stating she is requesting orders for URINALYSIS WITH REFLEX  ? ?Please advise - [phone] ok to leave vm  ?

## 2021-06-19 DIAGNOSIS — R309 Painful micturition, unspecified: Secondary | ICD-10-CM | POA: Diagnosis not present

## 2021-06-19 NOTE — Telephone Encounter (Signed)
Karna Dupes called wants to know the status of the urinalysis order for pt request a call back  # [phone] ?

## 2021-06-19 NOTE — Telephone Encounter (Signed)
Spoke with Katelyn Kennedy.  She states Katelyn Kennedy is having pain with urination and foul smelling odor to urine.  Verbal orders given for urinalysis with micro and urine culture as instructed by [name].  ?

## 2021-06-19 NOTE — Telephone Encounter (Signed)
Please call... document symptoms. ?If has urinary symtpoms... okay to proceed with UA, micro and culture .. given verbal orders. ?

## 2021-06-23 ENCOUNTER — Telehealth: Payer: Self-pay | Admitting: Family Medicine

## 2021-06-23 ENCOUNTER — Telehealth: Payer: Self-pay | Admitting: *Deleted

## 2021-06-23 NOTE — Telephone Encounter (Signed)
Patients sister in law Wenda called to find out when patient should start Decadron taper.  Reviewed last office note and see Dr Renda Rolls taper instructions and provided those.  Also encouraged them to continue to check her glucose levels as her need for Lantus may change.  Advised to report any changes or concerns. ?

## 2021-06-23 NOTE — Telephone Encounter (Signed)
Verbal orders given to [facility] via telephone to Extend PT 1 x week for 4 weeks.  ?

## 2021-06-23 NOTE — Telephone Encounter (Signed)
Home Health verbal orders ?Caller Name:Kelly ?Agency Name: [facility] ? ?Callback number: [phone] ? ?Requesting OT/PT/Skilled nursing/Social Work/Speech: ? ?Reason:Extend PT ? ?Frequency: 1 wk for 4 wks ? ?Please forward to [facility] pool or providers CMA  ?

## 2021-06-23 NOTE — Telephone Encounter (Signed)
Left message on voicemail to call the office back. 

## 2021-06-23 NOTE — Telephone Encounter (Signed)
Okay to give verbal orders as requested. 

## 2021-06-25 ENCOUNTER — Telehealth: Payer: Self-pay

## 2021-06-25 ENCOUNTER — Encounter: Payer: Self-pay | Admitting: Nurse Practitioner

## 2021-06-25 ENCOUNTER — Inpatient Hospital Stay (HOSPITAL_BASED_OUTPATIENT_CLINIC_OR_DEPARTMENT_OTHER): Payer: PPO | Admitting: Nurse Practitioner

## 2021-06-25 DIAGNOSIS — C7931 Secondary malignant neoplasm of brain: Secondary | ICD-10-CM

## 2021-06-25 DIAGNOSIS — C3490 Malignant neoplasm of unspecified part of unspecified bronchus or lung: Secondary | ICD-10-CM | POA: Diagnosis not present

## 2021-06-25 DIAGNOSIS — Z7189 Other specified counseling: Secondary | ICD-10-CM

## 2021-06-25 DIAGNOSIS — R530 Neoplastic (malignant) related fatigue: Secondary | ICD-10-CM

## 2021-06-25 DIAGNOSIS — Z515 Encounter for palliative care: Secondary | ICD-10-CM

## 2021-06-25 DIAGNOSIS — C3431 Malignant neoplasm of lower lobe, right bronchus or lung: Secondary | ICD-10-CM | POA: Diagnosis not present

## 2021-06-25 NOTE — Progress Notes (Signed)
? ?  ?Palliative Medicine ?[place]  ?[phone] [phone] ? ? ?Name: ROCHELL PUETT ?Date: 06/25/2021 ?MRN: [id number]  ?DOB: [birth year] ? ?Patient Care Team: ?[name], MD as PCP - General ?[name], [facility] as Pharmacist (Pharmacist) ?Gery Pray, MD as Consulting Physician (Radiation Oncology) ?Pickenpack-Cousar, Carlena Sax, NP as Nurse Practitioner (Nurse Practitioner)  ? ?I connected with [name] on 06/25/21 at 12:00 PM EDT by phone and verified that I am speaking with the correct person using two identifiers.  ? ?I discussed the limitations, risks, security and privacy concerns of performing an evaluation and management service by telemedicine and the availability of in-person appointments. I also discussed with the patient that there may be a patient responsible charge related to this service. The patient expressed understanding and agreed to proceed.  ? ?Other persons participating in the visit and their role in the encounter: [name]   ? ?Patient?s location: Home   ?Provider?s location: [place]   ? ?Chief Complaint: Follow-up  ? ?  ?INTERVAL HISTORY: ?PRINCESS KARNES is a 76 y.o. 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  She was admitted on 03/05/2021 from home with worsening left-sided weakness. Discharged to SNF rehab. CT of head showed progression of metastatic lung cancer with multiple hemorrhagic lesions and vasogenic edema.  Patient followed by Dr. Sondra Come ([place]) and Dr. Julien Nordmann. Recently completed whole brain radiation. Patient was recently admitted on 05/08/21 where she was found to have a submassive PE with right heart strain and new lung nodules. She was discharged on Eliquis s/p heparin drip.  ? ?SOCIAL HISTORY:    ? reports that she quit smoking about 4 years ago. Her smoking use  included cigarettes. She has a 20.00 pack-year smoking history. She has never used smokeless tobacco. She reports that she does not currently use alcohol. She reports that she does not use drugs. ? ?ADVANCE DIRECTIVES:  ?Completed during recent hospitalization. Patient named her sister [name] as her primary medical decision maker given her daughters both live out of state. ? ?CODE STATUS: DNR ? ?PAST MEDICAL HISTORY: ?Past Medical History:  ?Diagnosis Date  ? Allergic rhinitis   ? Arthritis   ? Diabetes mellitus without complication ([place])   ? diet control/no meds per pt  ? Dyspnea   ? Essential hypertension 04/11/2019  ? History of chicken pox   ? History of hiatal hernia   ? History of radiation therapy   ? right lung - 06/28/2017-08/11/2017  Dr Kyung Rudd  ? History of radiation therapy   ? SRS brain - 11/29/2020-12/04/2020  Dr Jenny Reichmann Moody/Matthew Tammi Klippel  ? History of radiation therapy   ? Whole brain - 03/10/2021-03/19/2021 Dr Gery Pray  ? lung ca dx'd 04/2017  ? Lung cancer ([place])   ? Peripheral vascular disease ([place])   ? Pneumonia   ? PONV (postoperative nausea and vomiting)   ? Smoker   ? ? ?ALLERGIES:  is allergic to codeine. ? ?MEDICATIONS:  ?Current Outpatient Medications  ?Medication Sig Dispense Refill  ? amitriptyline (ELAVIL) 50 MG tablet Take 1 tablet (50 mg total) by mouth at bedtime. 90 tablet 3  ? apixaban (ELIQUIS) 5 MG TABS tablet Take 1 tablet (5 mg total) by mouth 2 (two) times daily. 60 tablet 2  ? atorvastatin (LIPITOR) 40 MG tablet 1 tablet  po daily (Patient taking differently:  Take 40 mg by mouth every evening.) 90 tablet 3  ? blood glucose meter kit and supplies KIT Dispense based on patient and insurance preference. Use up to four times daily as directed. 1 each 0  ? cholecalciferol (VITAMIN D) 25 MCG (1000 UNIT) tablet Take 1,000 Units by mouth daily.    ? dexamethasone (DECADRON) 2 MG tablet Take 2 tablets (4 mg total) by mouth daily for 7 days, THEN 1 tablet (2 mg total) daily for 7  days, THEN 0.5 tablets (1 mg total) daily for 7 days. 30 tablet 0  ? docusate sodium (COLACE) 100 MG capsule Take 1 capsule (100 mg total) by mouth 2 (two) times daily. 60 capsule 0  ? insulin glargine (LANTUS) 100 UNIT/ML Solostar Pen Inject 10 Units into the skin at bedtime. 15 mL 0  ? Insulin Pen Needle 31G X 8 MM MISC 1 Device by Does not apply route daily. For use with insulin pens 100 each 0  ? levETIRAcetam (KEPPRA) 500 MG tablet Take 1 tablet (500 mg total) by mouth 2 (two) times daily. (Patient taking differently: Take 500 mg by mouth daily.) 60 tablet 3  ? lisinopril (ZESTRIL) 2.5 MG tablet Take 2.5 mg by mouth daily.    ? Multiple Vitamin (MULTIVITAMIN) capsule Take 1 capsule by mouth daily. Centrum Silver    ? polyethylene glycol (MIRALAX / GLYCOLAX) 17 g packet Take 17 g by mouth daily as needed for moderate constipation. 14 each 0  ? Propylene Glycol (SYSTANE COMPLETE OP) Place 1 drop into both eyes daily as needed (dry eyes).    ? vitamin B-12 (CYANOCOBALAMIN) 1000 MCG tablet Take 1,000 mcg by mouth daily.    ? ?No current facility-administered medications for this visit.  ? ? ?VITAL SIGNS: ?There were no vitals taken for this visit. ?There were no vitals filed for this visit. ?  ?Estimated body mass index is 26.54 kg/m? as calculated from the following: ?  Height as of 06/11/21: _0  (1.473 m). ?  Weight as of 06/11/21: 127 lb (57.6 kg). ? ? ?PERFORMANCE STATUS (ECOG) : 2 - Symptomatic, <50% confined to bed ? ?IMPRESSION: ? ?I connected with Ms. Veals and her sister-in-law, [name] by phone today for follow-up. Patient states she is doing better however does continue to endorse some weakness. Is participating in home PT/OT. [name] shares they have 3 more visits remaining.  ? ?Toy denies nausea, vomiting. Endorses occasional aches, pains, and headaches.  ? ?She is able to assist more with ADLs. States Physical Therapist advised she may be able to stay alone for a 1-2 hours. Family expressed concerns  about her being alone for long periods of time due to ongoing stability. Ms. Lora is hopeful for continued stability and expresses she does not wish to be in the hospital or at a facility. [name] shares they are alternating care 24/7 every 3 days. They have been in contact with DSS worker and in the process of applying for [employer] however with understanding their is a 1-2 month waiting list. Patient's daughters live out of state and her sisters-in-law express burnout and need for support due to weighing on their own physical demands and needs.  ? ?We discussed ways to honor [place] wishes of staying in the home and offering support to her family members. [name] states DSS worker provided them with a list of local home health agencies. Advised to begin reaching out and gaining understanding of the support and services they may offer allowing them  to make the best feasible decision. I also provided them with the number for [facility] and encouraged to call and see if they may have ability to offer assistance.  ? ?Ms. Molloy expressed overall she feels that she is doing better. No complaints or needs for symptom management at this time. Appetite is great. She is getting out the house some with family which she is appreciative of.  ? ?I discussed the importance of continued conversation with family and their medical providers regarding overall plan of care and treatment options, ensuring decisions are within the context of the patients values and GOCs. ? ?PLAN: ?Poetry is much improved since her recent hospitalization.  Patient and family appreciative as patient is near baseline. Able to assist in some ADLs. We will continue to take things day by day as they remain hopeful for continued stability.  Patient and family planning to look into further home health support to allow respite and additional support to her sister-in-laws.  ?No symptom management needs at this time.  Patient currently taking dexamethasone  as prescribed. ?I will plan to have phone visit follow-up in 2-3 weeks. ? ?Patient expressed understanding and was in agreement with this plan. She also understands that She can call the clinic at any ti

## 2021-06-25 NOTE — Telephone Encounter (Signed)
Patient's relative, Mariann Laster, called to ask about Decadron taper. Family had not picked up new script with 2 mg tablets and instructions. Reviewed taper instructions. They will pick up prescription today and will call back if they have additional questions. ?

## 2021-06-30 ENCOUNTER — Telehealth: Payer: Self-pay | Admitting: Family Medicine

## 2021-06-30 DIAGNOSIS — E119 Type 2 diabetes mellitus without complications: Secondary | ICD-10-CM

## 2021-06-30 NOTE — Telephone Encounter (Signed)
Pt called in requesting a RX for Pepcid pt is having acid reflux . Please advise # [phone]  ?

## 2021-07-01 ENCOUNTER — Other Ambulatory Visit: Payer: Self-pay | Admitting: Family Medicine

## 2021-07-01 ENCOUNTER — Telehealth: Payer: Self-pay | Admitting: *Deleted

## 2021-07-01 DIAGNOSIS — I951 Orthostatic hypotension: Secondary | ICD-10-CM | POA: Diagnosis not present

## 2021-07-01 DIAGNOSIS — Z794 Long term (current) use of insulin: Secondary | ICD-10-CM | POA: Diagnosis not present

## 2021-07-01 DIAGNOSIS — M1990 Unspecified osteoarthritis, unspecified site: Secondary | ICD-10-CM | POA: Diagnosis not present

## 2021-07-01 DIAGNOSIS — Z7901 Long term (current) use of anticoagulants: Secondary | ICD-10-CM | POA: Diagnosis not present

## 2021-07-01 DIAGNOSIS — Z7952 Long term (current) use of systemic steroids: Secondary | ICD-10-CM | POA: Diagnosis not present

## 2021-07-01 DIAGNOSIS — S065X0D Traumatic subdural hemorrhage without loss of consciousness, subsequent encounter: Secondary | ICD-10-CM | POA: Diagnosis not present

## 2021-07-01 DIAGNOSIS — C3431 Malignant neoplasm of lower lobe, right bronchus or lung: Secondary | ICD-10-CM | POA: Diagnosis not present

## 2021-07-01 DIAGNOSIS — I700 Atherosclerosis of aorta: Secondary | ICD-10-CM | POA: Diagnosis not present

## 2021-07-01 DIAGNOSIS — I119 Hypertensive heart disease without heart failure: Secondary | ICD-10-CM | POA: Diagnosis not present

## 2021-07-01 DIAGNOSIS — E1169 Type 2 diabetes mellitus with other specified complication: Secondary | ICD-10-CM | POA: Diagnosis not present

## 2021-07-01 DIAGNOSIS — Z9181 History of falling: Secondary | ICD-10-CM | POA: Diagnosis not present

## 2021-07-01 DIAGNOSIS — Z86718 Personal history of other venous thrombosis and embolism: Secondary | ICD-10-CM | POA: Diagnosis not present

## 2021-07-01 DIAGNOSIS — W19XXXD Unspecified fall, subsequent encounter: Secondary | ICD-10-CM | POA: Diagnosis not present

## 2021-07-01 DIAGNOSIS — I2510 Atherosclerotic heart disease of native coronary artery without angina pectoris: Secondary | ICD-10-CM | POA: Diagnosis not present

## 2021-07-01 DIAGNOSIS — Z87891 Personal history of nicotine dependence: Secondary | ICD-10-CM | POA: Diagnosis not present

## 2021-07-01 DIAGNOSIS — R531 Weakness: Secondary | ICD-10-CM | POA: Diagnosis not present

## 2021-07-01 DIAGNOSIS — I2699 Other pulmonary embolism without acute cor pulmonale: Secondary | ICD-10-CM | POA: Diagnosis not present

## 2021-07-01 DIAGNOSIS — C7931 Secondary malignant neoplasm of brain: Secondary | ICD-10-CM | POA: Diagnosis not present

## 2021-07-01 DIAGNOSIS — R5383 Other fatigue: Secondary | ICD-10-CM | POA: Diagnosis not present

## 2021-07-01 DIAGNOSIS — E119 Type 2 diabetes mellitus without complications: Secondary | ICD-10-CM

## 2021-07-01 DIAGNOSIS — E785 Hyperlipidemia, unspecified: Secondary | ICD-10-CM | POA: Diagnosis not present

## 2021-07-01 DIAGNOSIS — E1151 Type 2 diabetes mellitus with diabetic peripheral angiopathy without gangrene: Secondary | ICD-10-CM | POA: Diagnosis not present

## 2021-07-01 DIAGNOSIS — I82432 Acute embolism and thrombosis of left popliteal vein: Secondary | ICD-10-CM | POA: Diagnosis not present

## 2021-07-01 DIAGNOSIS — K760 Fatty (change of) liver, not elsewhere classified: Secondary | ICD-10-CM | POA: Diagnosis not present

## 2021-07-01 DIAGNOSIS — M5030 Other cervical disc degeneration, unspecified cervical region: Secondary | ICD-10-CM | POA: Diagnosis not present

## 2021-07-01 MED ORDER — ACCU-CHEK GUIDE VI STRP: ORAL_STRIP | 3 refills | Status: AC

## 2021-07-01 MED ORDER — ACCU-CHEK SOFTCLIX LANCETS MISC: 3 refills | Status: AC

## 2021-07-01 MED ORDER — FAMOTIDINE 20 MG PO TABS: 20.0000 mg | ORAL_TABLET | Freq: Two times a day (BID) | ORAL | 11 refills | Status: AC

## 2021-07-01 NOTE — Telephone Encounter (Signed)
Refills sent to pharmacy per request. ?

## 2021-07-01 NOTE — Telephone Encounter (Signed)
Patient notified that script has been sent to the pharmacy. ?

## 2021-07-01 NOTE — Telephone Encounter (Signed)
Patient's sister-in-law requested a refill on test strips and fastclix lancets. ?Pharmacy [facility] ?

## 2021-07-01 NOTE — Telephone Encounter (Signed)
Spoke to patient by telephone and was advised that she has been having acid reflux/heartburn. Patient stated that she had heartburn when she was taking radiation and the Pepcid helped. Patient denies chest pain or SOB.  ?Pharmacy [facility] [place] ?

## 2021-07-02 DIAGNOSIS — C7931 Secondary malignant neoplasm of brain: Secondary | ICD-10-CM | POA: Diagnosis not present

## 2021-07-02 DIAGNOSIS — C3431 Malignant neoplasm of lower lobe, right bronchus or lung: Secondary | ICD-10-CM | POA: Diagnosis not present

## 2021-07-02 DIAGNOSIS — S065X0D Traumatic subdural hemorrhage without loss of consciousness, subsequent encounter: Secondary | ICD-10-CM | POA: Diagnosis not present

## 2021-07-02 DIAGNOSIS — R5383 Other fatigue: Secondary | ICD-10-CM | POA: Diagnosis not present

## 2021-07-02 DIAGNOSIS — I700 Atherosclerosis of aorta: Secondary | ICD-10-CM | POA: Diagnosis not present

## 2021-07-02 DIAGNOSIS — K760 Fatty (change of) liver, not elsewhere classified: Secondary | ICD-10-CM | POA: Diagnosis not present

## 2021-07-02 DIAGNOSIS — R531 Weakness: Secondary | ICD-10-CM | POA: Diagnosis not present

## 2021-07-02 DIAGNOSIS — I951 Orthostatic hypotension: Secondary | ICD-10-CM | POA: Diagnosis not present

## 2021-07-02 DIAGNOSIS — I119 Hypertensive heart disease without heart failure: Secondary | ICD-10-CM | POA: Diagnosis not present

## 2021-07-02 DIAGNOSIS — Z87891 Personal history of nicotine dependence: Secondary | ICD-10-CM | POA: Diagnosis not present

## 2021-07-02 DIAGNOSIS — Z794 Long term (current) use of insulin: Secondary | ICD-10-CM | POA: Diagnosis not present

## 2021-07-02 DIAGNOSIS — M1990 Unspecified osteoarthritis, unspecified site: Secondary | ICD-10-CM | POA: Diagnosis not present

## 2021-07-02 DIAGNOSIS — Z86718 Personal history of other venous thrombosis and embolism: Secondary | ICD-10-CM | POA: Diagnosis not present

## 2021-07-02 DIAGNOSIS — Z7952 Long term (current) use of systemic steroids: Secondary | ICD-10-CM | POA: Diagnosis not present

## 2021-07-02 DIAGNOSIS — E1151 Type 2 diabetes mellitus with diabetic peripheral angiopathy without gangrene: Secondary | ICD-10-CM | POA: Diagnosis not present

## 2021-07-02 DIAGNOSIS — I2510 Atherosclerotic heart disease of native coronary artery without angina pectoris: Secondary | ICD-10-CM | POA: Diagnosis not present

## 2021-07-02 DIAGNOSIS — W19XXXD Unspecified fall, subsequent encounter: Secondary | ICD-10-CM | POA: Diagnosis not present

## 2021-07-02 DIAGNOSIS — M5030 Other cervical disc degeneration, unspecified cervical region: Secondary | ICD-10-CM | POA: Diagnosis not present

## 2021-07-02 DIAGNOSIS — Z9181 History of falling: Secondary | ICD-10-CM | POA: Diagnosis not present

## 2021-07-02 DIAGNOSIS — I2699 Other pulmonary embolism without acute cor pulmonale: Secondary | ICD-10-CM | POA: Diagnosis not present

## 2021-07-02 DIAGNOSIS — Z7901 Long term (current) use of anticoagulants: Secondary | ICD-10-CM | POA: Diagnosis not present

## 2021-07-02 DIAGNOSIS — I82432 Acute embolism and thrombosis of left popliteal vein: Secondary | ICD-10-CM | POA: Diagnosis not present

## 2021-07-02 DIAGNOSIS — E1169 Type 2 diabetes mellitus with other specified complication: Secondary | ICD-10-CM | POA: Diagnosis not present

## 2021-07-02 DIAGNOSIS — E785 Hyperlipidemia, unspecified: Secondary | ICD-10-CM | POA: Diagnosis not present

## 2021-07-03 ENCOUNTER — Telehealth: Payer: Self-pay

## 2021-07-03 DIAGNOSIS — E785 Hyperlipidemia, unspecified: Secondary | ICD-10-CM | POA: Diagnosis not present

## 2021-07-03 DIAGNOSIS — E1169 Type 2 diabetes mellitus with other specified complication: Secondary | ICD-10-CM | POA: Diagnosis not present

## 2021-07-03 DIAGNOSIS — M1990 Unspecified osteoarthritis, unspecified site: Secondary | ICD-10-CM | POA: Diagnosis not present

## 2021-07-03 DIAGNOSIS — Z794 Long term (current) use of insulin: Secondary | ICD-10-CM | POA: Diagnosis not present

## 2021-07-03 DIAGNOSIS — W19XXXD Unspecified fall, subsequent encounter: Secondary | ICD-10-CM | POA: Diagnosis not present

## 2021-07-03 DIAGNOSIS — Z7952 Long term (current) use of systemic steroids: Secondary | ICD-10-CM | POA: Diagnosis not present

## 2021-07-03 DIAGNOSIS — K760 Fatty (change of) liver, not elsewhere classified: Secondary | ICD-10-CM | POA: Diagnosis not present

## 2021-07-03 DIAGNOSIS — E1151 Type 2 diabetes mellitus with diabetic peripheral angiopathy without gangrene: Secondary | ICD-10-CM | POA: Diagnosis not present

## 2021-07-03 DIAGNOSIS — Z9181 History of falling: Secondary | ICD-10-CM | POA: Diagnosis not present

## 2021-07-03 DIAGNOSIS — S065X0D Traumatic subdural hemorrhage without loss of consciousness, subsequent encounter: Secondary | ICD-10-CM | POA: Diagnosis not present

## 2021-07-03 DIAGNOSIS — I119 Hypertensive heart disease without heart failure: Secondary | ICD-10-CM | POA: Diagnosis not present

## 2021-07-03 DIAGNOSIS — C3431 Malignant neoplasm of lower lobe, right bronchus or lung: Secondary | ICD-10-CM | POA: Diagnosis not present

## 2021-07-03 DIAGNOSIS — M5030 Other cervical disc degeneration, unspecified cervical region: Secondary | ICD-10-CM | POA: Diagnosis not present

## 2021-07-03 DIAGNOSIS — I700 Atherosclerosis of aorta: Secondary | ICD-10-CM | POA: Diagnosis not present

## 2021-07-03 DIAGNOSIS — I2699 Other pulmonary embolism without acute cor pulmonale: Secondary | ICD-10-CM | POA: Diagnosis not present

## 2021-07-03 DIAGNOSIS — Z87891 Personal history of nicotine dependence: Secondary | ICD-10-CM | POA: Diagnosis not present

## 2021-07-03 DIAGNOSIS — I2510 Atherosclerotic heart disease of native coronary artery without angina pectoris: Secondary | ICD-10-CM | POA: Diagnosis not present

## 2021-07-03 DIAGNOSIS — R531 Weakness: Secondary | ICD-10-CM | POA: Diagnosis not present

## 2021-07-03 DIAGNOSIS — Z7901 Long term (current) use of anticoagulants: Secondary | ICD-10-CM | POA: Diagnosis not present

## 2021-07-03 DIAGNOSIS — I82432 Acute embolism and thrombosis of left popliteal vein: Secondary | ICD-10-CM | POA: Diagnosis not present

## 2021-07-03 DIAGNOSIS — R5383 Other fatigue: Secondary | ICD-10-CM | POA: Diagnosis not present

## 2021-07-03 DIAGNOSIS — C7931 Secondary malignant neoplasm of brain: Secondary | ICD-10-CM | POA: Diagnosis not present

## 2021-07-03 DIAGNOSIS — I951 Orthostatic hypotension: Secondary | ICD-10-CM | POA: Diagnosis not present

## 2021-07-03 DIAGNOSIS — Z86718 Personal history of other venous thrombosis and embolism: Secondary | ICD-10-CM | POA: Diagnosis not present

## 2021-07-03 NOTE — Telephone Encounter (Signed)
Verbal order for social work. Urinary symptoms have resolved. Call verbal orders [phone] ?

## 2021-07-04 ENCOUNTER — Telehealth: Payer: Self-pay

## 2021-07-04 NOTE — Telephone Encounter (Signed)
Pt caregiver /friend called to report pt is "spiraling down." Pt has lost movement on her left side and is unable to ambulate or bear weight. She has started having headaches and seems to be retaining fluids. Home Health nurse reports hearing "fluid around the heart."  There are three Advised pt to call 911 to have pt evaluated in the ED.  ? ? ?

## 2021-07-04 NOTE — Telephone Encounter (Signed)
Okay to given verbal order for social work as requested. ?

## 2021-07-04 NOTE — Telephone Encounter (Signed)
Katelyn Kennedy, Katelyn Kennedy's sister, called our office to inform us that Katelyn Kennedy is declining rapidly and becoming difficult to move. She stated that they are not going to be able to continue to care for her and have spoke to Katelyn Kennedy about hiring outside help, but she has not done this. ?Per Katelyn Baton, NP, the recommendation would be to call 911 if she continues to decline or to transition to a comfort level based care with Hospice. Katelyn Kennedy stated that Katelyn Kennedy does not want Hospice at this time. She also stated that she would call the daughter and let her know about Katelyn Kennedy decline. I informed her that Katelyn Kennedy daughters would legally be the decision makers for her if she becomes unable to make her own decisions. Understanding verbalized. All questions answered. Advised to call our office with any questions/concerns.  ?

## 2021-07-07 ENCOUNTER — Other Ambulatory Visit: Payer: Self-pay

## 2021-07-07 ENCOUNTER — Inpatient Hospital Stay (HOSPITAL_COMMUNITY)
Admission: EM | Admit: 2021-07-07 | Discharge: 2021-08-14 | DRG: 083 | Disposition: A | Discharge status: Skilled Nursing Facility | Payer: PPO | Attending: Internal Medicine | Admitting: Internal Medicine

## 2021-07-07 ENCOUNTER — Emergency Department (HOSPITAL_COMMUNITY): Payer: PPO

## 2021-07-07 ENCOUNTER — Encounter (HOSPITAL_COMMUNITY): Payer: Self-pay | Admitting: Emergency Medicine

## 2021-07-07 ENCOUNTER — Telehealth: Payer: Self-pay | Admitting: *Deleted

## 2021-07-07 DIAGNOSIS — D649 Anemia, unspecified: Secondary | ICD-10-CM | POA: Diagnosis not present

## 2021-07-07 DIAGNOSIS — Z7189 Other specified counseling: Secondary | ICD-10-CM | POA: Diagnosis not present

## 2021-07-07 DIAGNOSIS — E1151 Type 2 diabetes mellitus with diabetic peripheral angiopathy without gangrene: Secondary | ICD-10-CM | POA: Diagnosis not present

## 2021-07-07 DIAGNOSIS — S06360A Traumatic hemorrhage of cerebrum, unspecified, without loss of consciousness, initial encounter: Secondary | ICD-10-CM | POA: Diagnosis not present

## 2021-07-07 DIAGNOSIS — Y92009 Unspecified place in unspecified non-institutional (private) residence as the place of occurrence of the external cause: Secondary | ICD-10-CM

## 2021-07-07 DIAGNOSIS — Z833 Family history of diabetes mellitus: Secondary | ICD-10-CM

## 2021-07-07 DIAGNOSIS — Z66 Do not resuscitate: Secondary | ICD-10-CM | POA: Diagnosis not present

## 2021-07-07 DIAGNOSIS — S061XAA Traumatic cerebral edema with loss of consciousness status unknown, initial encounter: Secondary | ICD-10-CM | POA: Diagnosis present

## 2021-07-07 DIAGNOSIS — Z86718 Personal history of other venous thrombosis and embolism: Secondary | ICD-10-CM

## 2021-07-07 DIAGNOSIS — Z1330 Encounter for screening examination for mental health and behavioral disorders, unspecified: Secondary | ICD-10-CM | POA: Diagnosis not present

## 2021-07-07 DIAGNOSIS — Z803 Family history of malignant neoplasm of breast: Secondary | ICD-10-CM

## 2021-07-07 DIAGNOSIS — E119 Type 2 diabetes mellitus without complications: Secondary | ICD-10-CM | POA: Diagnosis not present

## 2021-07-07 DIAGNOSIS — Z9221 Personal history of antineoplastic chemotherapy: Secondary | ICD-10-CM

## 2021-07-07 DIAGNOSIS — Z794 Long term (current) use of insulin: Secondary | ICD-10-CM

## 2021-07-07 DIAGNOSIS — D72829 Elevated white blood cell count, unspecified: Secondary | ICD-10-CM

## 2021-07-07 DIAGNOSIS — Z7401 Bed confinement status: Secondary | ICD-10-CM | POA: Diagnosis not present

## 2021-07-07 DIAGNOSIS — C7931 Secondary malignant neoplasm of brain: Secondary | ICD-10-CM | POA: Diagnosis present

## 2021-07-07 DIAGNOSIS — E11649 Type 2 diabetes mellitus with hypoglycemia without coma: Secondary | ICD-10-CM | POA: Diagnosis present

## 2021-07-07 DIAGNOSIS — Z043 Encounter for examination and observation following other accident: Secondary | ICD-10-CM | POA: Diagnosis not present

## 2021-07-07 DIAGNOSIS — J3489 Other specified disorders of nose and nasal sinuses: Secondary | ICD-10-CM | POA: Diagnosis not present

## 2021-07-07 DIAGNOSIS — Z961 Presence of intraocular lens: Secondary | ICD-10-CM | POA: Diagnosis present

## 2021-07-07 DIAGNOSIS — W06XXXA Fall from bed, initial encounter: Secondary | ICD-10-CM | POA: Diagnosis present

## 2021-07-07 DIAGNOSIS — R531 Weakness: Secondary | ICD-10-CM

## 2021-07-07 DIAGNOSIS — S065XAA Traumatic subdural hemorrhage with loss of consciousness status unknown, initial encounter: Principal | ICD-10-CM | POA: Diagnosis present

## 2021-07-07 DIAGNOSIS — Z87891 Personal history of nicotine dependence: Secondary | ICD-10-CM

## 2021-07-07 DIAGNOSIS — G936 Cerebral edema: Secondary | ICD-10-CM | POA: Diagnosis present

## 2021-07-07 DIAGNOSIS — I10 Essential (primary) hypertension: Secondary | ICD-10-CM | POA: Diagnosis not present

## 2021-07-07 DIAGNOSIS — Z885 Allergy status to narcotic agent status: Secondary | ICD-10-CM

## 2021-07-07 DIAGNOSIS — Z9189 Other specified personal risk factors, not elsewhere classified: Secondary | ICD-10-CM

## 2021-07-07 DIAGNOSIS — Z800 Family history of malignant neoplasm of digestive organs: Secondary | ICD-10-CM

## 2021-07-07 DIAGNOSIS — G4700 Insomnia, unspecified: Secondary | ICD-10-CM | POA: Diagnosis present

## 2021-07-07 DIAGNOSIS — M545 Low back pain, unspecified: Secondary | ICD-10-CM | POA: Diagnosis not present

## 2021-07-07 DIAGNOSIS — R32 Unspecified urinary incontinence: Secondary | ICD-10-CM | POA: Diagnosis present

## 2021-07-07 DIAGNOSIS — Z86711 Personal history of pulmonary embolism: Secondary | ICD-10-CM

## 2021-07-07 DIAGNOSIS — Z9842 Cataract extraction status, left eye: Secondary | ICD-10-CM | POA: Diagnosis not present

## 2021-07-07 DIAGNOSIS — R9089 Other abnormal findings on diagnostic imaging of central nervous system: Secondary | ICD-10-CM | POA: Diagnosis not present

## 2021-07-07 DIAGNOSIS — Z79899 Other long term (current) drug therapy: Secondary | ICD-10-CM

## 2021-07-07 DIAGNOSIS — Z515 Encounter for palliative care: Secondary | ICD-10-CM | POA: Diagnosis not present

## 2021-07-07 DIAGNOSIS — E785 Hyperlipidemia, unspecified: Secondary | ICD-10-CM | POA: Diagnosis not present

## 2021-07-07 DIAGNOSIS — M6281 Muscle weakness (generalized): Secondary | ICD-10-CM | POA: Diagnosis not present

## 2021-07-07 DIAGNOSIS — E1169 Type 2 diabetes mellitus with other specified complication: Secondary | ICD-10-CM | POA: Diagnosis present

## 2021-07-07 DIAGNOSIS — Z85118 Personal history of other malignant neoplasm of bronchus and lung: Secondary | ICD-10-CM | POA: Diagnosis not present

## 2021-07-07 DIAGNOSIS — I619 Nontraumatic intracerebral hemorrhage, unspecified: Secondary | ICD-10-CM | POA: Diagnosis not present

## 2021-07-07 DIAGNOSIS — I959 Hypotension, unspecified: Secondary | ICD-10-CM | POA: Diagnosis not present

## 2021-07-07 DIAGNOSIS — C3490 Malignant neoplasm of unspecified part of unspecified bronchus or lung: Secondary | ICD-10-CM | POA: Diagnosis present

## 2021-07-07 DIAGNOSIS — Z7901 Long term (current) use of anticoagulants: Secondary | ICD-10-CM | POA: Diagnosis not present

## 2021-07-07 DIAGNOSIS — Z9841 Cataract extraction status, right eye: Secondary | ICD-10-CM

## 2021-07-07 DIAGNOSIS — Z923 Personal history of irradiation: Secondary | ICD-10-CM

## 2021-07-07 DIAGNOSIS — W19XXXA Unspecified fall, initial encounter: Secondary | ICD-10-CM | POA: Diagnosis not present

## 2021-07-07 DIAGNOSIS — R262 Difficulty in walking, not elsewhere classified: Secondary | ICD-10-CM | POA: Diagnosis not present

## 2021-07-07 LAB — CBC
HCT: 35.2 % — ABNORMAL LOW (ref 36.0–46.0)
Hemoglobin: 11.3 g/dL — ABNORMAL LOW (ref 12.0–15.0)
MCH: 29.4 pg (ref 26.0–34.0)
MCHC: 32.1 g/dL (ref 30.0–36.0)
MCV: 91.7 fL (ref 80.0–100.0)
Platelets: 249 10*3/uL (ref 150–400)
RBC: 3.84 MIL/uL — ABNORMAL LOW (ref 3.87–5.11)
RDW: 16 % — ABNORMAL HIGH (ref 11.5–15.5)
WBC: 10.6 10*3/uL — ABNORMAL HIGH (ref 4.0–10.5)
nRBC: 0 % (ref 0.0–0.2)

## 2021-07-07 LAB — COMPREHENSIVE METABOLIC PANEL
ALT: 23 U/L (ref 0–44)
AST: 22 U/L (ref 15–41)
Albumin: 3 g/dL — ABNORMAL LOW (ref 3.5–5.0)
Alkaline Phosphatase: 90 U/L (ref 38–126)
Anion gap: 7 (ref 5–15)
BUN: 14 mg/dL (ref 8–23)
CO2: 25 mmol/L (ref 22–32)
Calcium: 9.1 mg/dL (ref 8.9–10.3)
Chloride: 107 mmol/L (ref 98–111)
Creatinine, Ser: 0.83 mg/dL (ref 0.44–1.00)
GFR, Estimated: >60 mL/min (ref >60)
Glucose, Bld: 87 mg/dL (ref 70–99)
Potassium: 3.5 mmol/L (ref 3.5–5.1)
Sodium: 139 mmol/L (ref 135–145)
Total Bilirubin: 0.6 mg/dL (ref 0.3–1.2)
Total Protein: 6.1 g/dL — ABNORMAL LOW (ref 6.5–8.1)

## 2021-07-07 MED ORDER — INSULIN GLARGINE 100 UNIT/ML SOLOSTAR PEN: 10.0000 [IU] | PEN_INJECTOR | Freq: Every day | SUBCUTANEOUS | Status: DC

## 2021-07-07 MED ORDER — INSULIN GLARGINE-YFGN 100 UNIT/ML ~~LOC~~ SOLN
10.0000 [IU] | Freq: Every day | SUBCUTANEOUS | Status: DC
  Administered 2021-07-08 – 2021-07-12 (×6): 10 [IU] via SUBCUTANEOUS
  Filled 2021-07-07 (×7): qty 0.1

## 2021-07-07 MED ORDER — FAMOTIDINE 20 MG PO TABS
20.0000 mg | ORAL_TABLET | Freq: Two times a day (BID) | ORAL | Status: DC
Start: 2021-07-07 — End: 2021-07-20
  Administered 2021-07-08 – 2021-07-20 (×26): 20 mg via ORAL
  Filled 2021-07-07 (×26): qty 1

## 2021-07-07 MED ORDER — VITAMIN B-12 1000 MCG PO TABS
1000.0000 ug | ORAL_TABLET | Freq: Every day | ORAL | Status: DC
  Administered 2021-07-07 – 2021-08-14 (×39): 1000 ug via ORAL
  Filled 2021-07-07 (×39): qty 1

## 2021-07-07 MED ORDER — APIXABAN 5 MG PO TABS
5.0000 mg | ORAL_TABLET | Freq: Two times a day (BID) | ORAL | Status: DC
  Administered 2021-07-08 – 2021-08-14 (×76): 5 mg via ORAL
  Filled 2021-07-07 (×76): qty 1

## 2021-07-07 MED ORDER — ACETAMINOPHEN 650 MG RE SUPP: 650.0000 mg | Freq: Four times a day (QID) | RECTAL | Status: DC | PRN

## 2021-07-07 MED ORDER — ACETAMINOPHEN 325 MG PO TABS
650.0000 mg | ORAL_TABLET | Freq: Four times a day (QID) | ORAL | Status: DC | PRN
  Administered 2021-08-02 – 2021-08-05 (×2): 650 mg via ORAL
  Filled 2021-07-07 (×6): qty 2

## 2021-07-07 MED ORDER — DEXAMETHASONE SODIUM PHOSPHATE 10 MG/ML IJ SOLN
10.0000 mg | Freq: Once | INTRAMUSCULAR | Status: AC
  Administered 2021-07-07: 10 mg via INTRAVENOUS
  Filled 2021-07-07: qty 1

## 2021-07-07 MED ORDER — DEXAMETHASONE 4 MG PO TABS
4.0000 mg | ORAL_TABLET | Freq: Two times a day (BID) | ORAL | Status: DC
  Administered 2021-07-08 – 2021-08-04 (×55): 4 mg via ORAL
  Filled 2021-07-07 (×55): qty 1

## 2021-07-07 MED ORDER — AMITRIPTYLINE HCL 50 MG PO TABS
50.0000 mg | ORAL_TABLET | Freq: Every day | ORAL | Status: DC
  Administered 2021-07-08 – 2021-08-13 (×38): 50 mg via ORAL
  Filled 2021-07-07 (×39): qty 1

## 2021-07-07 MED ORDER — LEVETIRACETAM 500 MG PO TABS
500.0000 mg | ORAL_TABLET | Freq: Two times a day (BID) | ORAL | Status: DC
Start: 2021-07-07 — End: 2021-08-14
  Administered 2021-07-08 – 2021-08-14 (×76): 500 mg via ORAL
  Filled 2021-07-07 (×76): qty 1

## 2021-07-07 MED ORDER — POLYETHYLENE GLYCOL 3350 17 G PO PACK: 17.0000 g | PACK | Freq: Every day | ORAL | Status: DC | PRN

## 2021-07-07 MED ORDER — DEXAMETHASONE 4 MG PO TABS: 4.0000 mg | ORAL_TABLET | Freq: Two times a day (BID) | ORAL | Status: DC

## 2021-07-07 MED ORDER — SODIUM CHLORIDE 0.9% FLUSH
3.0000 mL | Freq: Two times a day (BID) | INTRAVENOUS | Status: DC
  Administered 2021-07-08 – 2021-07-21 (×27): 3 mL via INTRAVENOUS

## 2021-07-07 MED ORDER — ALBUTEROL SULFATE (2.5 MG/3ML) 0.083% IN NEBU: 2.5000 mg | INHALATION_SOLUTION | Freq: Four times a day (QID) | RESPIRATORY_TRACT | Status: DC | PRN

## 2021-07-07 MED ORDER — ATORVASTATIN CALCIUM 40 MG PO TABS
40.0000 mg | ORAL_TABLET | Freq: Every evening | ORAL | Status: DC
  Administered 2021-07-07 – 2021-08-13 (×37): 40 mg via ORAL
  Filled 2021-07-07 (×38): qty 1

## 2021-07-07 MED ORDER — DOCUSATE SODIUM 100 MG PO CAPS: 100.0000 mg | ORAL_CAPSULE | Freq: Every day | ORAL | Status: DC | PRN

## 2021-07-07 MED ORDER — INSULIN ASPART 100 UNIT/ML IJ SOLN
0.0000 [IU] | Freq: Three times a day (TID) | INTRAMUSCULAR | Status: DC
  Administered 2021-07-08: 2 [IU] via SUBCUTANEOUS
  Administered 2021-07-08: 1 [IU] via SUBCUTANEOUS
  Administered 2021-07-08: 3 [IU] via SUBCUTANEOUS
  Administered 2021-07-09: 1 [IU] via SUBCUTANEOUS
  Administered 2021-07-09: 2 [IU] via SUBCUTANEOUS
  Administered 2021-07-09: 4 [IU] via SUBCUTANEOUS
  Administered 2021-07-10: 1 [IU] via SUBCUTANEOUS
  Administered 2021-07-10: 5 [IU] via SUBCUTANEOUS
  Administered 2021-07-10: 1 [IU] via SUBCUTANEOUS
  Administered 2021-07-11 (×2): 3 [IU] via SUBCUTANEOUS
  Administered 2021-07-12: 2 [IU] via SUBCUTANEOUS
  Administered 2021-07-12: 1 [IU] via SUBCUTANEOUS
  Administered 2021-07-12 – 2021-07-13 (×2): 4 [IU] via SUBCUTANEOUS
  Administered 2021-07-13: 5 [IU] via SUBCUTANEOUS
  Administered 2021-07-14: 1 [IU] via SUBCUTANEOUS
  Administered 2021-07-14: 6 [IU] via SUBCUTANEOUS
  Administered 2021-07-14: 3 [IU] via SUBCUTANEOUS
  Administered 2021-07-15: 1 [IU] via SUBCUTANEOUS
  Administered 2021-07-15: 3 [IU] via SUBCUTANEOUS
  Administered 2021-07-16: 1 [IU] via SUBCUTANEOUS
  Administered 2021-07-16 (×2): 3 [IU] via SUBCUTANEOUS
  Administered 2021-07-17: 4 [IU] via SUBCUTANEOUS
  Administered 2021-07-17 (×2): 1 [IU] via SUBCUTANEOUS
  Administered 2021-07-18: 2 [IU] via SUBCUTANEOUS
  Administered 2021-07-18 – 2021-07-19 (×2): 3 [IU] via SUBCUTANEOUS
  Administered 2021-07-19 – 2021-07-20 (×2): 2 [IU] via SUBCUTANEOUS
  Administered 2021-07-20 – 2021-07-21 (×2): 4 [IU] via SUBCUTANEOUS

## 2021-07-07 NOTE — Assessment & Plan Note (Addendum)
Reviewed CBGs, running a little bit high ?-Patient had been relatively well controlled.  ? Last hemoglobin A1c 6.5 on 06/04/2021.  Home regimen included Lantus 10 units nightly ?-Hypoglycemic protocols ?-Increasing long-acting insulin to 17 units nightly ?-CBGs before every meal with very sensitive SSI ?

## 2021-07-07 NOTE — Evaluation (Signed)
Physical Therapy Evaluation ?Patient Details ?Name: Katelyn Kennedy ?MRN: [id number] ?DOB: January 14, 1946 ?Today's Date: 07/07/2021 ? ?History of Present Illness ? Pt is a 76 y/o female presenting to ED following fall. PMH includes Lung cancer with brain mets, DM, HTN.  ?Clinical Impression ? Pt admitted secondary to problem above with deficits below. Pt requiring max A for bed mobility tasks and required mod to max A to maintain sitting balance. Was unable to perform further mobility safely with +1 assist. Pt with increased weakness in L extremities and having increased difficulty at home per notes. Recommending SNF level therapies at d/c to address current deficits. Will continue to follow acutely.    ?   ? ?Recommendations for follow up therapy are one component of a multi-disciplinary discharge planning process, led by the attending physician.  Recommendations may be updated based on patient status, additional functional criteria and insurance authorization. ? ?Follow Up Recommendations Skilled nursing-short term rehab (<3 hours/day) ? ?  ?Assistance Recommended at Discharge Frequent or constant Supervision/Assistance  ?Patient can return home with the following ? A lot of help with walking and/or transfers;A lot of help with bathing/dressing/bathroom;Assistance with cooking/housework;Direct supervision/assist for financial management;Direct supervision/assist for medications management;Assist for transportation;Help with stairs or ramp for entrance ? ?  ?Equipment Recommendations Wheelchair (measurements PT);Wheelchair cushion (measurements PT);Other (comment) (hoyer lift with pad)  ?Recommendations for Other Services ?    ?  ?Functional Status Assessment Patient has had a recent decline in their functional status and demonstrates the ability to make significant improvements in function in a reasonable and predictable amount of time.  ? ?  ?Precautions / Restrictions Precautions ?Precautions:  Fall ?Restrictions ?Weight Bearing Restrictions: No  ? ?  ? ?Mobility ? Bed Mobility ?Overal bed mobility: Needs Assistance ?Bed Mobility: Supine to Sit, Sit to Supine ?  ?  ?Supine to sit: Max assist ?Sit to supine: Max assist ?  ?General bed mobility comments: Max A for LE and trunk assist to come to sitting. Posterior lean noted and requiring mod up to max A to maintain. Unsafe to attempt further mobility with +1 assist. ?  ? ?Transfers ?  ?  ?  ?  ?  ?  ?  ?  ?  ?  ?  ? ?Ambulation/Gait ?  ?  ?  ?  ?  ?  ?  ?  ? ?Stairs ?  ?  ?  ?  ?  ? ?Wheelchair Mobility ?  ? ?Modified Rankin (Stroke Patients Only) ?  ? ?  ? ?Balance Overall balance assessment: Needs assistance ?Sitting-balance support: Bilateral upper extremity supported ?Sitting balance-Leahy Scale: Poor ?Sitting balance - Comments: Reliant on at least mod to maintain sitting balance. ?  ?  ?  ?  ?  ?  ?  ?  ?  ?  ?  ?  ?  ?  ?  ?   ? ? ? ?Pertinent Vitals/Pain Pain Assessment ?Pain Assessment: No/denies pain  ? ? ?Home Living Family/patient expects to be discharged to:: Private residence ?Living Arrangements: Alone ?Available Help at Discharge: Family;Friend(s);Available 24 hours/day ?Type of Home: Apartment ?Home Access: Stairs to enter ?Entrance Stairs-Rails: None ?Entrance Stairs-Number of Steps: 1 ?  ?Home Layout: One level ?Home Equipment: Rollator (4 wheels);Shower seat;Hand held shower head;Grab bars - tub/shower;Grab bars - toilet ?   ?  ?Prior Function Prior Level of Function : Needs assist ?  ?  ?  ?  ?  ?  ?Mobility Comments: Pt reporting conflicting  information. Reports she sometimes needs assist and uses Rollator majority of time. ?ADLs Comments: Reports she was independent, but then reports she needed assist. ?  ? ? ?Hand Dominance  ?   ? ?  ?Extremity/Trunk Assessment  ? Upper Extremity Assessment ?Upper Extremity Assessment: LUE deficits/detail ?LUE Deficits / Details: LUE weakness noted, but pt reports this as baseline. Difficulty  elevating LUE. ?  ? ?Lower Extremity Assessment ?Lower Extremity Assessment: LLE deficits/detail ?LLE Deficits / Details: Increased weakness noted in LLE and required assist to perform heel slide. ?  ? ?   ?Communication  ? Communication: No difficulties  ?Cognition Arousal/Alertness: Awake/alert ?Behavior During Therapy: Flat affect ?Overall Cognitive Status: No family/caregiver present to determine baseline cognitive functioning ?  ?  ?  ?  ?  ?  ?  ?  ?  ?  ?  ?  ?  ?  ?  ?  ?General Comments: Slow processing and kept perseverating on food that was not in room. When putting on glasses, pt kept one of the arms of the glasses down and had the other out to put glasses on. ?  ?  ? ?  ?General Comments General comments (skin integrity, edema, etc.): No family present ? ?  ?Exercises    ? ?Assessment/Plan  ?  ?PT Assessment Patient needs continued PT services  ?PT Problem List Decreased strength;Decreased balance;Decreased activity tolerance;Decreased mobility;Decreased cognition;Decreased knowledge of use of DME;Decreased safety awareness;Decreased knowledge of precautions ? ?   ?  ?PT Treatment Interventions DME instruction;Functional mobility training;Therapeutic activities;Balance training;Therapeutic exercise;Patient/family education   ? ?PT Goals (Current goals can be found in the Care Plan section)  ?Acute Rehab PT Goals ?Patient Stated Goal: to go home ?PT Goal Formulation: With patient ?Time For Goal Achievement: 07/21/21 ?Potential to Achieve Goals: Fair ? ?  ?Frequency Min 2X/week ?  ? ? ?Co-evaluation   ?  ?  ?  ?  ? ? ?  ?AM-PAC PT "6 Clicks" Mobility  ?Outcome Measure Help needed turning from your back to your side while in a flat bed without using bedrails?: A Lot ?Help needed moving from lying on your back to sitting on the side of a flat bed without using bedrails?: A Lot ?Help needed moving to and from a bed to a chair (including a wheelchair)?: Total ?Help needed standing up from a chair using your  arms (e.g., wheelchair or bedside chair)?: Total ?Help needed to walk in hospital room?: Total ?Help needed climbing 3-5 steps with a railing? : Total ?6 Click Score: 8 ? ?  ?End of Session   ?Activity Tolerance: Patient tolerated treatment well ?Patient left: in bed;with call bell/phone within reach (on stretcher in ED) ?  ?PT Visit Diagnosis: Unsteadiness on feet (R26.81);Muscle weakness (generalized) (M62.81);Repeated falls (R29.6);History of falling (Z91.81) ?  ? ?Time: 1497-0263 ?PT Time Calculation (min) (ACUTE ONLY): 21 min ? ? ?Charges:   PT Evaluation ?$PT Eval Moderate Complexity: 1 Mod ?  ?  ?   ? ? ?Reuel Derby, PT, DPT  ?Acute Rehabilitation Services  ?Pager: [phone] ?Office: [phone] ? ? ?[place] ?07/07/2021, 1:37 PM ?

## 2021-07-07 NOTE — H&P (Addendum)
?History and Physical  ? ? ?Patient: Katelyn Kennedy CHE:[id number] DOB: 1945/10/03 ?DOA: 07/07/2021 ?DOS: the patient was seen and examined on 07/07/2021 ?PCP: [name], MD  ?Patient coming from: Home via EMS ? ?Chief Complaint:  ?Chief Complaint  ?Patient presents with  ? Fall  ? ?HPI: Katelyn Kennedy is a 76 y.o. 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  Patient reports History is obtained from the patient as well as her sister-in-law over the phone.  3 family members have been rotating to try and provide 24-hour care since November 2022.  Two of those family members are over the age of 46 and have difficulty with picking the patient up, and she reports that on 3/31 the rotation is supposed to stop.  At baseline the patient needs assistance with all of her ADLs, but have been able to ambulate with the use of a walker some up until about 3 days ago.  Of note she had been told to taper off of her steroids and had taken her last dose yesterday.  Since the tapering of her steroids patient has become more weak and family noted that she was not able to use her left side much at all.  Unable to get up or move out of bed yesterday.  When she tried to get up this morning she slid out of bed and denies having sustaining any injuries.  Her sister-in-law was present, but unable to get her up for which she called EMS.  Patient reports that she had been eating and drinking okay.  She has a chronic intermittent mostly nonproductive cough that is unchanged.  Denies having any fever, chills, nausea, vomiting, or diarrhea.  Although her sister-in-law states that she has had some incontinence.Patient had been hospitalized in January this year where she was  found to have the pulmonary embolus and subsequently in February of this year for worsening left-sided weakness. ? ?Upon admission into the emergency department patient was noted to be afebrile with heart rates to 110 and all other vital signs maintained.  Labs significant for WBC 10.6 and hemoglobin 11.1.  Chest x-ray noted ill-defined opacity of the perihilar right lung and right lung base increased in prominence concerning for superimposed airspace disease or recurrent tumor cannot be excluded.  CT scan of the head noted extensive dural based hemorrhagic metastatic disease without significant change in extensive vasogenic edema or midline shift small volume extra axonal hemorrhage along the right frontal convexity with superimposed traumatic hemorrhage difficult to fully exclude. ? ?Review of Systems: As mentioned in the history of present illness. All other systems reviewed and are negative. ?Past Medical History:  ?Diagnosis Date  ? Allergic rhinitis   ? Arthritis   ? Diabetes mellitus without complication ([place])   ? diet control/no meds per pt  ? Dyspnea   ? Essential hypertension 04/11/2019  ? History of chicken pox   ? History of hiatal hernia   ? History of radiation therapy   ? right lung - 06/28/2017-08/11/2017  Dr Kyung Rudd  ? History of radiation therapy   ? SRS brain - 11/29/2020-12/04/2020  Dr Jenny Reichmann [place]/Matthew Tammi Klippel  ? History of radiation therapy   ? Whole brain - 03/10/2021-03/19/2021 Dr Gery Pray  ?  lung ca dx'd 04/2017  ? Lung cancer ([place])   ? Peripheral vascular disease ([place])   ? Pneumonia   ? PONV (postoperative nausea and vomiting)   ? Smoker   ? ?Past Surgical History:  ?Procedure Laterality Date  ? ANKLE FRACTURE SURGERY Left 10 years ago  ? "hard to wake up from anesthesia" per pt  ? APPLICATION OF CRANIAL NAVIGATION Right 11/07/2020  ? Procedure: APPLICATION OF CRANIAL NAVIGATION;  Surgeon: Earnie Larsson, MD;  Location: [facility];  Service: Neurosurgery;  Laterality: Right;  ? [place]  ? BREAST BIOPSY Right 1978  ? BENIGN CYST  ? BREAST EXCISIONAL BIOPSY Left 2011  ? Benign  ? BREAST EXCISIONAL BIOPSY Right 1985  ? Benign   ? CATARACT EXTRACTION    ? CATARACT EXTRACTION W/ INTRAOCULAR LENS IMPLANT Bilateral   ? COLONOSCOPY  2011  ? CRANIOTOMY Right 11/07/2020  ? Procedure: Right Sided Craniotomy for Tumor;  Surgeon: Earnie Larsson, MD;  Location: [facility];  Service: Neurosurgery;  Laterality: Right;  ? ELECTROMAGNETIC NAVIGATION BROCHOSCOPY N/A 06/08/2017  ? Procedure: ELECTROMAGNETIC NAVIGATION BRONCHOSCOPY;  Surgeon: Flora Lipps, MD;  Location: [facility];  Service: Cardiopulmonary;  Laterality: N/A;  ? FRACTURE SURGERY    ? POLYPECTOMY  2011  ? TRANSURETHRAL RESECTION OF BLADDER TUMOR WITH MITOMYCIN-C N/A 05/04/2017  ? Procedure: TRANSURETHRAL RESECTION OF BLADDER TUMOR WITH gemcitabine;  Surgeon: Abbie Sons, MD;  Location: [facility];  Service: Urology;  Laterality: N/A;  ? TUBAL LIGATION    ? ?Social History:  reports that she quit smoking about 4 years ago. Her smoking use included cigarettes. She has a 20.00 pack-year smoking history. She has never used smokeless tobacco. She reports that she does not currently use alcohol. She reports that she does not use drugs. ? ?Allergies  ?Allergen Reactions  ? Codeine Nausea Only  ? ? ?Family History  ?Problem Relation Age of Onset  ? Diabetes Mother   ? Hypothyroidism Mother   ? Cancer Sister 24  ?     BREAST  ? Breast cancer Sister   ? Hypothyroidism Sister   ? Cancer Maternal Grandmother   ?     COLON  ? Colon cancer Maternal Grandmother 92  ? Stomach cancer Neg Hx   ? ? ?Prior to Admission medications   ?Medication Sig Start Date End Date Taking? Authorizing Provider  ?amitriptyline (ELAVIL) 50 MG tablet Take 1 tablet (50 mg total) by mouth at bedtime. 10/15/20  Yes Bedsole, Amy E, MD  ?apixaban (ELIQUIS) 5 MG TABS tablet Take 1 tablet (5 mg total) by mouth 2 (two) times daily. 05/12/21  Yes Pokhrel, Laxman, MD  ?atorvastatin (LIPITOR) 40  MG tablet 1 tablet  po daily ?Patient taking differently: Take 40 mg by mouth every evening. 10/15/20  Yes Bedsole, Amy E, MD  ?cholecalciferol (VITAMIN D) 25 MCG (1000 UNIT) tablet Take 1,000 Units by mouth daily.   Yes [provider]  ?dexamethasone (DECADRON) 2 MG tablet Take 1 mg by mouth daily. Last day of taper dose   Yes [provider]  ?docusate sodium (COLACE) 100 MG capsule Take 100 mg by mouth daily as needed for mild constipation.   Yes [provider]  ?famotidine (PEPCID) 20 MG tablet Take 1 tablet (20 mg total) by mouth 2 (two) times daily. 07/01/21  Yes Bedsole, Amy E, MD  ?insulin glargine (LANTUS) 100 UNIT/ML Solostar Pen Inject 10 Units into the skin at bedtime. 06/06/21  Yes [name]  K, MD  ?lisinopril (ZESTRIL) 2.5 MG tablet Take 2.5 mg by mouth daily. 04/01/21  Yes [provider]  ?Multiple Vitamin (MULTIVITAMIN) capsule Take 1 capsule by mouth daily. Centrum Silver   Yes [provider]  ?polyethylene glycol (MIRALAX / GLYCOLAX) 17 g packet Take 17 g by mouth daily as needed for moderate constipation. 03/18/21  Yes Lavina Hamman, MD  ?vitamin B-12 (CYANOCOBALAMIN) 1000 MCG tablet Take 1,000 mcg by mouth daily.   Yes [provider]  ?Accu-Chek Softclix Lancets lancets Check blood sugar three times a day 07/01/21   [name], MD  ?blood glucose meter kit and supplies KIT Dispense based on patient and insurance preference. Use up to four times daily as directed. 06/06/21   Donne Hazel, MD  ?glucose blood (ACCU-CHEK GUIDE) test strip Check blood sugar three times a day 07/01/21   Eliezer Lofts E, MD  ?Insulin Pen Needle 31G X 8 MM MISC 1 Device by Does not apply route daily. For use with insulin pens 06/06/21   Donne Hazel, MD  ?levETIRAcetam (KEPPRA) 500 MG tablet Take 1 tablet (500 mg total) by mouth 2 (two) times daily. ?Patient not taking: Reported on 07/07/2021 05/01/21   Ventura Sellers, MD  ? ? ?Physical Exam: ?Vitals:  ?  07/07/21 0238 07/07/21 0241 07/07/21 0945  ?BP:  128/64 111/65  ?Pulse:  99 (!) 110  ?Resp:  18 17  ?Temp:  99 ?F (37.2 ?C) 98 ?F (36.7 ?C)  ?SpO2:  99% 99%  ?Weight: (P) 57.6 kg    ?Height: (P) '4\' 10"'  (1.47

## 2021-07-07 NOTE — Assessment & Plan Note (Signed)
Chronic.  Hemoglobin 11.1 g/dL which appears near patient's baseline ?-Continue to monitor ?

## 2021-07-07 NOTE — ED Provider Notes (Signed)
76 year old female received in handoff from Dr. Tyrone Nine.  He reported that she was waiting to go home this morning.  She had fallen last night.  She has a sister, sister-in-law, niece who are her caregivers.  They had the keys to her apartment.  She wished to go home but there was no way for her to get in ?Patient has had recent steroid taper.  Discussed with Dr. Mickeal Skinner, on-call for neuro oncology.  He advises that this could likely be a recurrence or progression of tumor.  Advises to get CT of head and probable MRI. ?Labs and CT of head ordered. ?Plan admission to hospitalist service ?Discussed with Gregary Signs, on for palliative care and she will see and assess ?Plan admission for brain mets and steroids ?Palliative care saw and evaluated the following patient ?Patient is DNR ? ?  ?Pattricia Boss, MD ?07/08/21 0932 ? ?

## 2021-07-07 NOTE — Assessment & Plan Note (Addendum)
Acute.  WBC elevated at 10.6, but appears to be trending down from last check on 3/1 at 13.7.  Possibly related with patient being on steroids. ?-Checked - procalcitonin and urinalysis -within normal limits ?-No signs of infection, ? ?

## 2021-07-07 NOTE — Discharge Instructions (Addendum)
Please return anytime you would like to be evaluated.  Follow-up with your doctor in the office. ? ?Information on my medicine - ELIQUIS? (apixaban) ? ?This medication education was reviewed with me or my healthcare representative as part of my discharge preparation.  The pharmacist that spoke with me during my hospital stay was:  ? ? ?Why was Eliquis? prescribed for you? ?Eliquis? was prescribed to treat blood clots that may have been found in the veins of your legs (deep vein thrombosis) or in your lungs (pulmonary embolism) and to reduce the risk of them occurring again. ? ?What do You need to know about Eliquis? ? ?Continue Eliquis 5 mg tablet taken TWICE daily.  Eliquis? may be taken with or without food.  ? ?Try to take the dose about the same time in the morning and in the evening. If you have difficulty swallowing the tablet whole please discuss with your pharmacist how to take the medication safely. ? ?Take Eliquis? exactly as prescribed and DO NOT stop taking Eliquis? without talking to the doctor who prescribed the medication.  Stopping may increase your risk of developing a new blood clot.  Refill your prescription before you run out. ? ?After discharge, you should have regular check-up appointments with your healthcare provider that is prescribing your Eliquis?. ?   ?What do you do if you miss a dose? ?If a dose of ELIQUIS? is not taken at the scheduled time, take it as soon as possible on the same day and twice-daily administration should be resumed. The dose should not be doubled to make up for a missed dose. ? ?Important Safety Information ?A possible side effect of Eliquis? is bleeding. You should call your healthcare provider right away if you experience any of the following: ?Bleeding from an injury or your nose that does not stop. ?Unusual colored urine (red or dark brown) or unusual colored stools (red or black). ?Unusual bruising for unknown reasons. ?A serious fall or if you hit your head  (even if there is no bleeding). ? ?Some medicines may interact with Eliquis? and might increase your risk of bleeding or clotting while on Eliquis?Marland Kitchen To help avoid this, consult your healthcare provider or pharmacist prior to using any new prescription or non-prescription medications, including herbals, vitamins, non-steroidal anti-inflammatory drugs (NSAIDs) and supplements. ? ?This website has more information on Eliquis? (apixaban): http://www.eliquis.com/eliquis/home ? ? ?

## 2021-07-07 NOTE — Discharge Planning (Signed)
This pt is home alone.  She does have home health that only comes in 2-3 times per week for about an hour.  Family is unable/unwilling to supplement care for her.   ? ?RNCM consulted regarding safe discharge planning (Home with [facility]).  Physical Therapy evaluation placed; will follow up after recommendations from PT.  ? ? ?

## 2021-07-07 NOTE — ED Notes (Signed)
[phone] Daughter Joelene Millin updated number. ?

## 2021-07-07 NOTE — ED Triage Notes (Signed)
Pt BIB EMS from home after slipping OOB, sister helped pt to floor, pt did not hit her head or LOC. Pt is on thinners. Pt c/o lower back pain. ?

## 2021-07-07 NOTE — Discharge Planning (Signed)
Pt currently active with [facility] services as confirmed by [facility] with Patient Ping.  Pt will resume [facility] services of RN/PT.   ?

## 2021-07-07 NOTE — ED Provider Notes (Addendum)
?[facility] ?Provider Note ? ? ?CSN: [id number] ?Arrival date & time: 07/07/21  0234 ? ?  ? ?History ? ?Chief Complaint  ?Patient presents with  ? Fall  ? ? ?Katelyn Kennedy is a 76 y.o. female. ? ?76 yo F with a cc of a fall. She tried to get out of bed and was unable.  Went down to the ground.  She was unable to then get up.  EMS was called to help her get up back into the bed and then they brought her here.  She is not really sure why she was brought.  She denies any injury in the fall.  She does not want to have any evaluation for this. ? ? ?Fall ? ? ?  ? ?Home Medications ?Prior to Admission medications   ?Medication Sig Start Date End Date Taking? Authorizing Provider  ?Accu-Chek Softclix Lancets lancets Check blood sugar three times a day 07/01/21   Eliezer Lofts E, MD  ?amitriptyline (ELAVIL) 50 MG tablet Take 1 tablet (50 mg total) by mouth at bedtime. 10/15/20   [name], MD  ?apixaban (ELIQUIS) 5 MG TABS tablet Take 1 tablet (5 mg total) by mouth 2 (two) times daily. 05/12/21   Pokhrel, Corrie Mckusick, MD  ?atorvastatin (LIPITOR) 40 MG tablet 1 tablet  po daily ?Patient taking differently: Take 40 mg by mouth every evening. 10/15/20   [name], MD  ?blood glucose meter kit and supplies KIT Dispense based on patient and insurance preference. Use up to four times daily as directed. 06/06/21   Donne Hazel, MD  ?cholecalciferol (VITAMIN D) 25 MCG (1000 UNIT) tablet Take 1,000 Units by mouth daily.    [provider]  ?famotidine (PEPCID) 20 MG tablet Take 1 tablet (20 mg total) by mouth 2 (two) times daily. 07/01/21   Bedsole, Amy E, MD  ?glucose blood (ACCU-CHEK GUIDE) test strip Check blood sugar three times a day 07/01/21   Bedsole, Amy E, MD  ?insulin glargine (LANTUS) 100 UNIT/ML Solostar Pen Inject 10 Units into the skin at bedtime. 06/06/21   Donne Hazel, MD  ?Insulin Pen Needle 31G X 8 MM MISC 1 Device by Does not apply route daily. For use with insulin  pens 06/06/21   Donne Hazel, MD  ?levETIRAcetam (KEPPRA) 500 MG tablet Take 1 tablet (500 mg total) by mouth 2 (two) times daily. ?Patient taking differently: Take 500 mg by mouth daily. 05/01/21   Ventura Sellers, MD  ?lisinopril (ZESTRIL) 2.5 MG tablet Take 2.5 mg by mouth daily. 04/01/21   [provider]  ?Multiple Vitamin (MULTIVITAMIN) capsule Take 1 capsule by mouth daily. Centrum Silver    [provider]  ?polyethylene glycol (MIRALAX / GLYCOLAX) 17 g packet Take 17 g by mouth daily as needed for moderate constipation. 03/18/21   Lavina Hamman, MD  ?Propylene Glycol (SYSTANE COMPLETE OP) Place 1 drop into both eyes daily as needed (dry eyes).    [provider]  ?vitamin B-12 (CYANOCOBALAMIN) 1000 MCG tablet Take 1,000 mcg by mouth daily.    [provider]  ?   ? ?Allergies    ?Codeine   ? ?Review of Systems   ?Review of Systems ? ?Physical Exam ?Updated Vital Signs ?BP 128/64   Pulse 99   Temp 99 ?F (37.2 ?C)   Resp 18   Ht (P) _0  (1.473 m)   Wt (P) 57.6 kg   SpO2 99%  BMI (P) 26.54 kg/m?  ?Physical Exam ?Vitals and nursing note reviewed.  ?Constitutional:   ?   General: She is not in acute distress. ?   Appearance: She is well-developed. She is not diaphoretic.  ?HENT:  ?   Head: Normocephalic and atraumatic.  ?Eyes:  ?   Pupils: Pupils are equal, round, and reactive to light.  ?Cardiovascular:  ?   Rate and Rhythm: Normal rate and regular rhythm.  ?   Heart sounds: No murmur heard. ?  No friction rub. No gallop.  ?Pulmonary:  ?   Effort: Pulmonary effort is normal.  ?   Breath sounds: No wheezing or rales.  ?Abdominal:  ?   General: There is no distension.  ?   Palpations: Abdomen is soft.  ?   Tenderness: There is no abdominal tenderness.  ?Musculoskeletal:     ?   General: No tenderness.  ?   Cervical back: Normal range of motion and neck supple.  ?Skin: ?   General: Skin is warm and dry.  ?Neurological:  ?   Mental Status: She is alert and oriented  to person, place, and time.  ?Psychiatric:     ?   Behavior: Behavior normal.  ? ? ?ED Results / Procedures / Treatments   ?Labs ?(all labs ordered are listed, but only abnormal results are displayed) ?Labs Reviewed - No data to display ? ?EKG ?None ? ?Radiology ?No results found. ? ?Procedures ?Procedures  ? ? ?Medications Ordered in ED ?Medications - No data to display ? ?ED Course/ Medical Decision Making/ A&P ?  ?                        ?Medical Decision Making ? 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  The patient denies any injury and actually does not want to be evaluated in the emergency department.  She tells me that the EMS people just took her here.  I discussed with her about obtaining imaging or blood tests, at this point she would rather just go home.  She plans to follow-up with her doctor if she needs to. ? ?Received call from daughter.  They feel they are unable to take care of her at home.  They would hope the patient could go to hospice.  Patient would like to be at home.  Consult social work, but not sure I can hold the patient here against her will.  ? ?2:45 AM:  I have discussed the diagnosis/risks/treatment options with the patient.  Evaluation and diagnostic testing in the emergency department does not suggest an emergent condition requiring admission or immediate intervention beyond what has been performed at this time.  They will follow up with  PCP. We also discussed returning to the ED immediately if new or worsening sx occur. We discussed the sx which are most concerning (e.g., sudden worsening pain, fever, inability to tolerate by mouth) that necessitate immediate return. Medications administered to the patient during their visit and any new prescriptions provided to the patient are listed below. ? ?Medications given during this visit ?Medications - No data to display ? ? ?The patient appears  reasonably screen and/or stabilized for discharge and I doubt any other medical condition or other [facility] requiring further screening, evaluation, or treatment in the ED at this time prior to discharge.  ? ? ? ? ? ? ? ? ?  Final Clinical Impression(s) / ED Diagnoses ?Final diagnoses:  ?Fall from bed, initial encounter  ? ? ?Rx / DC Orders ?ED Discharge Orders   ? ? None  ? ?  ? ? ?  ? ?  ?Deno Etienne, DO ?07/07/21 [phone] ? ?

## 2021-07-07 NOTE — Consult Note (Signed)
? ?                                                                                ?Consultation Note ?Date: 07/07/2021  ? ?Patient Name: Katelyn Kennedy  ?DOB: 06/02/1945  MRN: [id number]  Age / Sex: 76 y.o., female  ?PCP: [name], MD ?Referring Physician: Pattricia Boss, MD ? ?Reason for Consultation: Establishing goals of care ? ?HPI/Patient Profile: 76 y.o. 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 She had recently been tapered off Decadron.  ?In the ED, chest x-ray showed ill-defined opacity of the perihilar right lung and right lung base increased in prominence concerning for superimposed airspace disease or recurrent tumor. CT of the head showed extensive dural based hemorrhagic metastatic disease without significant change in extensive vasogenic edema or midline shift. Admitted to [facility]. ? ?Regarding oncology history, she was originally diagnosed as stage IIIa in February 2019. She is status post concurrent chemo and radiation (last dose 07/2017) and status post immunotherapy (last dose May 2020). She is also status craniotomy July 2022.   ? ?Clinical Assessment and Goals of Care: ?I have reviewed medical records including EPIC notes, labs and imaging, and discussed case with EDP Dr. Jeanell Sparrow as well as with my colleague Jobe Gibbon NP at the [facility]. ? ?I went to see patient at bedside in the ED to discuss diagnosis, prognosis, GOC, EOL wishes, disposition, and options. She is alert and mostly oriented, but her speech is tangential at times. ? ?Katelyn Kennedy has been followed by outpatient palliative care at the [facility]. I re-introduced Palliative Medicine as specialized medical care for people living with serious illness. It focuses on providing relief from  the symptoms and stress of a serious illness.  ? ?Katelyn Kennedy lives alone in a small apartment. Since Thanksgiving, her sisters-in-law [name] and [name] have been rotating to stay with her and provide 24/7 supervision. Her functional status has significantly declined Discussed that PT is recommending rehab. Katelyn Kennedy does tell me that she would be willing to go to rehab to try to regain strength.  ? ?We discussed her goals of care. She tells me she wants to "get back to normal". I gently told her this is very unlikely in the setting of advanced cancer. I provided education on the natural disease trajectory of advanced cancer, emphasizing that functional status is generally preserved until late in the disease course, followed by decline over weeks to months. Discussed that her condition is non-curable.  ? ?We discussed the issue of who would make medical  decisions for her if she was unable to make these decisions for herself. I explained that medical decision making is completely separate from financial decisions. I explained to Katelyn Kennedy that in the absence of a designated health care agent, by default her 2 daughters would make her decisions. Katelyn Kennedy indicates she would not really want her daughters to make decisions for her; however she is unwilling to designate a health care agent  at this time.  ? ?The difference between aggressive medical intervention and comfort care was considered. I did not specifically discuss hospice with Katelyn Kennedy today, but her sister-in-law [name] recently told Palliative RN that she was not ready for hospice (see note from 07/04/21). ? ?I later spoke with sister-in-law [name] by phone. [name] tells me that Katelyn Kennedy seems to be in denial of her declining condition. [name] and [name] are unable to continue caring for Katelyn Kennedy at home. They have told Katelyn Kennedy this, but she refuses to hire private care givers. [name] is concerned that  ? ?Primary decision maker: Patient has been making her own decisions; however it is  unclear whether she has full insight into her declining condition ?  ? ?SUMMARY OF RECOMMENDATIONS   ?DNR/DNI as previously documented ?Continue current interventions ?Family is unable to care for patient at home any longer ?If patient refuses rehab, would consider psych consult to evaluate capacity to make this decision ?PMT will continue to follow ? ?Prognosis:  ?< 6 months ? ?Discharge Planning: To Be Determined  ? ?  ? ?Primary Diagnoses: ?Present on Admission: ?**None** ? ? ?I have reviewed the medical record, interviewed the patient and family, and examined the patient. The following aspects are pertinent. ? ?Past Medical History:  ?Diagnosis Date  ? Allergic rhinitis   ? Arthritis   ? Diabetes mellitus without complication ([place])   ? diet control/no meds per pt  ? Dyspnea   ? Essential hypertension 04/11/2019  ? History of chicken pox   ? History of hiatal hernia   ? History of radiation therapy   ? right lung - 06/28/2017-08/11/2017  Dr Kyung Rudd  ? History of radiation therapy   ? SRS brain - 11/29/2020-12/04/2020  Dr Jenny Reichmann Moody/Matthew Tammi Klippel  ? History of radiation therapy   ? Whole brain - 03/10/2021-03/19/2021 Dr Gery Pray  ? lung ca dx'd 04/2017  ? Lung cancer ([place])   ? Peripheral vascular disease ([place])   ? Pneumonia   ? PONV (postoperative nausea and vomiting)   ? Smoker   ? ? ? ?Family History  ?Problem Relation Age of Onset  ? Diabetes Mother   ? Hypothyroidism Mother   ? Cancer Sister 34  ?     BREAST  ? Breast cancer Sister   ? Hypothyroidism Sister   ? Cancer Maternal Grandmother   ?     COLON  ? Colon cancer Maternal Grandmother 101  ? Stomach cancer Neg Hx   ? ?Scheduled Meds: ? dexamethasone (DECADRON) injection  10 mg Intravenous Once  ? ?Continuous Infusions: ?PRN Meds:. ?Medications Prior to Admission:  ?Prior to Admission medications   ? ?Allergies  ?Allergen Reactions  ? Codeine Nausea Only  ? ?Review of Systems  ?Neurological:  Positive for weakness.  ? ?Physical Exam ?Vitals reviewed.   ?Constitutional:   ?   Appearance: She is ill-appearing.  ?Pulmonary:  ?   Effort: Pulmonary effort is normal.  ?Neurological:  ?   Mental Status: She is alert.  ?   Motor: Weakness present.  ?Psychiatric:     ?   Speech: Speech is tangential.     ?   Cognition and Memory: Memory is impaired.  ? ? ?Vital Signs: BP 111/65 (BP Location: Left Arm)   Pulse (!) 110   Temp 98 ?F (36.7 ?C)   Resp 17   Ht (P) 4\' 10"  (1.473 m)   Wt (P) 57.6 kg   SpO2 99%   BMI (P) 26.54 kg/m?  ?  Pain Scale: 0-10 ?  ?Pain Score: 0-No pain ? ? ?SpO2: SpO2: 99 % ?O2 Device:SpO2: 99 % ?O2 Flow Rate: .  ? ?IO: Intake/output summary: No intake or output data in the 24 hours ending 07/07/21 1521 ? ?LBM:   ?Baseline Weight: Weight: (P) 57.6 kg ?Most recent weight: Weight: (P) 57.6 kg     ? ?Palliative Assessment/Data: PPS 30% ? ? ?MDM - High ? ?Signed by: ?Lavena Bullion, NP ?  ?Please contact Palliative Medicine Team phone at [phone] for questions and concerns.  ?For individual provider: See Amion ? ? ? ? ? ? ? ? ? ? ? ? ? ?

## 2021-07-07 NOTE — Assessment & Plan Note (Addendum)
Patient has been diagnosed with pulmonary embolus with right heart strain 05/08/2021 CT scan of the brain today did not show any clear signs of worsening bleed. ?-Continue Eliquis  ?

## 2021-07-07 NOTE — Progress Notes (Addendum)
RNCM awaiting call back from [place] for home hospice.  ? ?Addendum: ?5:07p This RNCM received call back from [facility] with [facility] who advised no referrals on file for patient. This RNCM requested a referral for home hospice and advised that patient has been admitted since speaking with [facility] earlier. [facility] will follow up with patient & family tomorrow.  ? ? ? ?

## 2021-07-07 NOTE — Telephone Encounter (Signed)
Attempted to reach patient's sister in law [name] to advise that when the patient gets discharged from the hospital we will need to coordinate an office visit. ? ?Left message pending call back. ?

## 2021-07-07 NOTE — Assessment & Plan Note (Addendum)
Patient had initially been diagnosed with suspicious metastatic pulmonary nodules and February 2019 with evidence of metastatic disease with solitary brain metastasis in July 2022.   ?-Continue Keppra ?-Decadron as recommended by Dr. Mickeal Skinner ?-Palliative care consulted following-patient declined hospice ? ?

## 2021-07-07 NOTE — Telephone Encounter (Signed)
Left message for Katelyn Kennedy giving her verbal orders for social work as instructed by [name].  ?

## 2021-07-07 NOTE — ED Notes (Signed)
Called family member requested by pt, Wenda, who states that she will not be able to pick the pt up until the AM. Pt states that she does not have a key and no one is at her address to let her in if [facility] takes her home.  ?

## 2021-07-07 NOTE — Assessment & Plan Note (Addendum)
-  Patient presents after having a fall at home with reports of worsening weakness.  She had just recently been tapered off of Decadron.   ?-CT imaging showing continued presence of brain mets with vasogenic edema and leftward shift of the brain. ?Case was discussed with Dr. Mickeal Skinner who recommended resuming Decadron 4 mg twice daily.  Patient has been given Decadron 10 mg IV x1 dose. ?-Decadron 4 mg twice daily ?-PT to eval and treat -recommending SNF, services denied ?-Transitions of care consulted -assisting with placement ?

## 2021-07-07 NOTE — Progress Notes (Signed)
RNCM received a call from [place] with [facility] [phone]) questioning SNF placement. Per RNCM review patient has [facility] services are currently in place via [place]. This RNCM will review for possible home hospice referral.  ? ?Addendum: RNCM spoke with Verdene Lennert [phone]) with [facility] to advise patient potentially being admitted inpatient. Verdene Lennert is advised patient does not have any family support. Patient's family called [facility] indicating they are unable to to take care of her and patient declined SNF. [facility] requesting home hospice for patient. This RNCM advised if patient is admitted inpt another RNCM/SW will be assigned and will follow her.   ? ? ?TOC will continue to follow ?

## 2021-07-07 NOTE — Patient Outreach (Signed)
[facility]) Care Management ? ?07/07/2021 ? ?Katelyn Kennedy ?1945-12-11 ?[id number] ? ? ?Referral from inpatient sent to [facility] Care Coordinator via email to [facility] Care Management ([email or web address]). ? ?[name] ?[facility]-Care Management Assistant ?[phone]  ?

## 2021-07-07 NOTE — Telephone Encounter (Signed)
Received call from sister in law Solon [place].  She stated that the patient was brought to the hospital last night.  She states that she stopped her steroids on 07/06/2021 and within the past 10 days she has started complaining of right eye headache and suture site.   ? ?Sister in law [name]) states that her left sided paralysis has progressively gotten worse as she has come off of the steroids and last night she fell and was completely unable to assist with standing and was total assist.   ? ?[name] states that patient is asking to go home from ER and their is no one there to help her but patient is unwilling to go to a snf.   ? ? ?Routing to Dr Mickeal Skinner to review steroid taper and progressive symptoms.   ?

## 2021-07-08 DIAGNOSIS — C3490 Malignant neoplasm of unspecified part of unspecified bronchus or lung: Secondary | ICD-10-CM | POA: Diagnosis not present

## 2021-07-08 DIAGNOSIS — R531 Weakness: Secondary | ICD-10-CM | POA: Diagnosis not present

## 2021-07-08 DIAGNOSIS — Z7189 Other specified counseling: Secondary | ICD-10-CM | POA: Diagnosis not present

## 2021-07-08 DIAGNOSIS — Z515 Encounter for palliative care: Secondary | ICD-10-CM | POA: Diagnosis not present

## 2021-07-08 DIAGNOSIS — Z86711 Personal history of pulmonary embolism: Secondary | ICD-10-CM | POA: Diagnosis not present

## 2021-07-08 DIAGNOSIS — C7931 Secondary malignant neoplasm of brain: Secondary | ICD-10-CM | POA: Diagnosis not present

## 2021-07-08 DIAGNOSIS — Z66 Do not resuscitate: Secondary | ICD-10-CM | POA: Diagnosis not present

## 2021-07-08 DIAGNOSIS — G936 Cerebral edema: Secondary | ICD-10-CM | POA: Diagnosis not present

## 2021-07-08 LAB — CBC
HCT: 38.9 % (ref 36.0–46.0)
Hemoglobin: 12.4 g/dL (ref 12.0–15.0)
MCH: 29.1 pg (ref 26.0–34.0)
MCHC: 31.9 g/dL (ref 30.0–36.0)
MCV: 91.3 fL (ref 80.0–100.0)
Platelets: 306 10*3/uL (ref 150–400)
RBC: 4.26 MIL/uL (ref 3.87–5.11)
RDW: 15.9 % — ABNORMAL HIGH (ref 11.5–15.5)
WBC: 7.3 10*3/uL (ref 4.0–10.5)
nRBC: 0 % (ref 0.0–0.2)

## 2021-07-08 LAB — BASIC METABOLIC PANEL
Anion gap: 10 (ref 5–15)
BUN: 16 mg/dL (ref 8–23)
CO2: 22 mmol/L (ref 22–32)
Calcium: 10.2 mg/dL (ref 8.9–10.3)
Chloride: 108 mmol/L (ref 98–111)
Creatinine, Ser: 0.69 mg/dL (ref 0.44–1.00)
GFR, Estimated: >60 mL/min (ref >60)
Glucose, Bld: 192 mg/dL — ABNORMAL HIGH (ref 70–99)
Potassium: 4.4 mmol/L (ref 3.5–5.1)
Sodium: 140 mmol/L (ref 135–145)

## 2021-07-08 LAB — GLUCOSE, CAPILLARY
Glucose-Capillary: 162 mg/dL — ABNORMAL HIGH (ref 70–99)
Glucose-Capillary: 201 mg/dL — ABNORMAL HIGH (ref 70–99)
Glucose-Capillary: 273 mg/dL — ABNORMAL HIGH (ref 70–99)
Glucose-Capillary: 168 mg/dL — ABNORMAL HIGH (ref 70–99)

## 2021-07-08 LAB — CBG MONITORING, ED: Glucose-Capillary: 190 mg/dL — ABNORMAL HIGH (ref 70–99)

## 2021-07-08 LAB — PROCALCITONIN: Procalcitonin: <0.1 ng/mL

## 2021-07-08 NOTE — Progress Notes (Signed)
?Progress note ? ?HPI: Katelyn Kennedy is a 76 y.o. 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  Patient reports History is obtained from the patient as well as her sister-in-law over the phone.  3 family members have been rotating to try and provide 24-hour care since November 2022.  Two of those family members are over the age of 33 and have difficulty with picking the patient up, and she reports that on 3/31 the rotation is supposed to stop.  At baseline the patient needs assistance with all of her ADLs, but have been able to ambulate with the use of a walker some up until about 3 days ago.  Of note she had been told to taper off of her steroids and had taken her last dose yesterday.  Since the tapering of her steroids patient has become more weak and family noted that she was not able to use her left side much at all.  Unable to get up or move out of bed yesterday.  When she tried to get up this morning she slid out of bed and denies having sustaining any injuries.  Her sister-in-law was present, but unable to get her up for which she called EMS.  Patient reports that she had been eating and drinking okay.  She has a chronic intermittent mostly nonproductive cough that is unchanged.  Denies having any fever, chills, nausea, vomiting, or diarrhea.  Although her sister-in-law states that she has had some incontinence.Patient had been hospitalized in January this year where she was found to have the pulmonary embolus and subsequently in February of this year for worsening left-sided weakness. ? ?Upon admission into the emergency department patient was noted to be afebrile with heart rates to 110 and all other vital signs maintained.  Labs  significant for WBC 10.6 and hemoglobin 11.1.  Chest x-ray noted ill-defined opacity of the perihilar right lung and right lung base increased in prominence concerning for superimposed airspace disease or recurrent tumor cannot be excluded.  CT scan of the head noted extensive dural based hemorrhagic metastatic disease without significant change in extensive vasogenic edema or midline shift small volume extra axonal hemorrhage along the right frontal convexity with superimposed traumatic hemorrhage difficult to fully exclude. ? ? ?Physical Exam: ?Vitals:  ? 07/08/21 0015 07/08/21 0250 07/08/21 0326 07/08/21 3818  ?BP: 100/64 104/68 104/68 104/64  ?Pulse: (!) 111 (!) 105 (!) 105 (!) 103  ?Resp: 20 18 18 17   ?Temp:  97.9 ?F (36.6 ?C) 97.9 ?F (36.6 ?C) 97.8 ?F (36.6 ?C)  ?TempSrc:   Oral Oral  ?SpO2: 95% 96%  99%  ?Weight:   56.4 kg   ?Height:   4\' 10"  (1.473 m)   ? ? ? ?Constitutional: Chronically ill appearing elderly female in no acute ?Eyes: PERRL, lids and conjunctivae normal ?ENMT: Mucous membranes are moist. Posterior pharynx clear of any exudate or lesions.  ?Neck: normal, supple, no masses, no thyromegaly ?Respiratory:   ?Cardiovascular: Regular rate and rhythm, no murmurs / rubs / gallops. . 2+ pedal pulses. No carotid bruits.  ?Abdomen: no tenderness, no masses palpated. No hepatosplenomegaly. Bowel sounds positive.  ?Musculoskeletal: no clubbing / cyanosis. No joint deformity upper and lower extremities. Good ROM, no contractures. Normal muscle  tone.  ?Skin: no rashes, lesions, ulcers. No induration ?Neurologic: CN 2-12 grossly intact.  Left upper and lower extremity weakness appreciated. ?Psychiatric: Normal judgment and insight. Alert and oriented x 3. Normal mood.  ? ?Data Reviewed: ? ?Labs and imaging studies reviewed continue ? ?Assessment and Plan: ?* Fall at home secondary to weakness ?Patient presents after having a fall at home with reports of worsening left-sided weakness related with brain  metastases with prior resection.  She had just recently been tapered off of Decadron.  CT imaging showing continued presence of brain mets with vasogenic edema and leftward shift of the brain. ?Case was discussed with Dr. Mickeal Skinner who recommended resuming Decadron 4 mg twice daily.  Patient has been given Decadron 10 mg IV x1 dose. ?-Admit to a telemetry bed ?-Decadron 4 mg twice daily ?-PT to eval and treat ?-Need short-term rehab placement ? 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 ?-Continue Eliquis  ? ?Leukocytosis ?Acute.  WBC elevated at 10.6, but appears to be trending down from last check on 3/1 at 13.7.  Possibly related with patient being on steroids. ?-No clear source of infection at this time ?-Continue to monitor ? ?Normocytic anemia ?Chronic.  Hemoglobin 11.1 g/dL which appears near patient's baseline ?-Continue to monitor ? ?Non-small cell lung cancer metastatic to brain [facility]) with vasogenic brain edema ?Patient had initially been diagnosed with suspicious metastatic pulmonary nodules and February 2019 with evidence of metastatic disease with solitary brain metastasis in July 2022.  Patient has been treated with chemoradiation and resection of the right posterior frontal dural metastasis. ?-Continue Keppra ?-Decadron as recommended by Dr. Mickeal Skinner ?-Appreciate palliative care consultative services, we will follow-up for any further recommendations ? ?Diet-controlled type 2 diabetes mellitus ([place]) ?Patient had been relatively well controlled.  Last hemoglobin A1c 6.5 on 06/04/2021.  Home regimen included Lantus 10 units nightly ?-Hypoglycemic protocols ?-Continue Lantus 10 units nightly ?-CBGs before every meal with very sensitive SSI ? ? ?Continue all other home medications as prescribed ? ? Advance Care Planning:   Code  Status: DNR   ? ?Author: ?Phillips Grout, MD ?07/08/2021 9:53 AM ? ?For on call review [email or web address].  ?

## 2021-07-08 NOTE — Progress Notes (Signed)
? ?                                                                                                                                                     ?                                                   ?Daily Progress Note  ? ?Patient Name: Katelyn Kennedy       Date: 07/08/2021 ?DOB: [birth year]  Age: 76 y.o. MRN#: [id number] ?Attending Physician: Phillips Grout, MD ?Primary Care Physician: [name], MD ?Admit Date: 07/07/2021 ? ? ? ?HPI/Patient Profile: 76 y.o. 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 She had recently been tapered off Decadron.  ?In the ED, chest x-ray showed ill-defined opacity of the perihilar right lung and right lung base increased in prominence concerning for superimposed airspace disease or recurrent tumor. CT of the head showed extensive dural based hemorrhagic metastatic disease without significant change in extensive vasogenic edema or midline shift. Admitted to [facility]. ?  ?Regarding oncology history, she was originally diagnosed as stage IIIa in February 2019. She is status post concurrent chemo and radiation (last dose 07/2017) and status post immunotherapy (last dose May 2020). She is also status craniotomy July 2022.   ? ?Subjective: ?I went to patient at bedside. She tells me is is doing "pretty good". No acute complaints.  ? ?I attempted to elicit goals of care. She tells me her goal is "to get well". We reviewed the trajectory of advanced metastatic cancer, emphasizing that it is a non-curable condition. She ultimately seemed to acknowledge this; however her speech continues to be quite tangential and difficult to follow.  ? ?We reviewed that she is currently very weak and unable to care for herself at home. Discussed that plan for rehab at  discharge, with the goal of improving her mobility and functional status. She seems to indicate that she is agreeable to this plan.  ? ? ?Objective: ? ?Physical Exam ?Vitals reviewed.  ?Constitutional:   ?   General: She is not in acute distress. ?   Appearance: She is ill-appearing.  ?Pulmonary:  ?   Effort: Pulmonary effort is normal.  ?Neurological:  ?   Mental Status: She is alert.  ?   Motor: Weakness present.  ?Psychiatric:     ?   Speech: Speech is tangential.     ?   Cognition and Memory: Cognition is impaired.  ?         ? ?  Vital Signs: BP 104/64 (BP Location: Right Arm)   Pulse (!) 103   Temp 97.8 ?F (36.6 ?C) (Oral)   Resp 17   Ht 4\' 10"  (1.473 m)   Wt 56.4 kg   SpO2 99%   BMI 25.99 kg/m?  ?SpO2: SpO2: 99 % ?O2 Device: O2 Device: Room Air ?O2 Flow Rate:   ? ?Intake/output summary: No intake or output data in the 24 hours ending 07/08/21 1117 ?LBM: Last BM Date : 07/07/21 ?Baseline Weight: Weight: (P) 57.6 kg ?Most recent weight: Weight: 56.4 kg ? ?     ?Palliative Assessment/Data: PPS 30-40% ? ? ? ? ?Palliative Care Assessment & Plan  ? 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 ?Recommendations/Plan: ?Patient seems to have limited insight into her condition - unclear whether she can make complex medical decisions  ?No documented HCPOA - daughter(s) are next of kin and would be decision makers if needed ?Plan for SNF/rehab - patient is agreeable ?Patient will be followed by palliative medicine at [facility] at discharge ? ? ?Code Status: DNR/DNI as previously documented ? ?Prognosis: ? < 6 months ? ?Discharge Planning: ?[place] for rehab with Palliative care service follow-up ? ?Care plan was discussed with Dr. Raelene Bott and [facility] team ? ?Thank you for allowing the Palliative Medicine Team to assist in the care of this  patient. ? ?MDM - Moderate ? ?Lavena Bullion, NP ? ?Please contact Palliative Medicine Team phone at [phone] for questions and concerns.  ? ? ? ? ? ?

## 2021-07-08 NOTE — [place] FL2 (Signed)
?[place] MEDICAID FL2 LEVEL OF CARE SCREENING TOOL  ?  ? ?IDENTIFICATION  ?Patient Name: ?Katelyn Kennedy Birthdate: March 24, 1946 Sex: female Admission Date (Current Location): ?07/07/2021  ?[place] and [place] Number: ? [place] ?  Facility and Address:  ?[facility], [place] [address], [place], [place] [address] ?     Provider Number: ?[id number]  ?Attending Physician Name and Address:  ?Phillips Grout, MD ? Relative Name and Phone Number:  ?  ?   ?Current Level of Care: ?Hospital Recommended Level of Care: ?[place] Prior Approval Number: ?  ? ?Date Approved/Denied: ?  PASRR Number: ?[id number] A ? ?Discharge Plan: ?SNF ?  ? ?Current Diagnoses: ?Patient Active Problem List  ? Diagnosis Date Noted  ? Fall at home secondary to weakness 07/07/2021  ? Normocytic anemia 07/07/2021  ? Leukocytosis 07/07/2021  ? History of DVT (deep vein thrombosis) 07/07/2021  ? History of pulmonary embolism and DVT on chronic anticoagulation 07/07/2021  ? Left-sided weakness 05/31/2021  ? Weakness of left side of body 05/30/2021  ? DVT (deep venous thrombosis) ([place]) 05/30/2021  ? HTN (hypertension) 05/30/2021  ? Acute deep vein thrombosis (DVT) of proximal vein of left lower extremity ([place]) 05/15/2021  ? Brain metastasis ([place]) 05/15/2021  ? Pulmonary embolism ([place]) 05/08/2021  ? Acute pulmonary embolism ([place]) 05/08/2021  ? Orthostatic hypotension 04/01/2021  ? Fall 04/01/2021  ? Fall at home, initial encounter 03/31/2021  ? Pressure injury of skin 03/07/2021  ? Intraparenchymal hemorrhage of brain ([place]) 03/05/2021  ? Vasogenic brain edema ([place]) 03/05/2021  ? Midline shift of brain 03/05/2021  ? Chronic anticoagulation 03/05/2021  ? Non-small cell lung cancer metastatic to brain [facility]) with vasogenic brain edema 03/05/2021  ? History of lung cancer 10/15/2020  ? Atherosclerosis of aorta ([place]) 10/15/2020  ? Pain in lateral left lower extremity 04/30/2020  ? Multiple thyroid nodules 04/03/2019  ?  History of bladder cancer 09/30/2018  ? Encounter for antineoplastic immunotherapy 09/13/2017  ? Chronic insomnia 08/17/2017  ? Encounter for antineoplastic chemotherapy 06/17/2017  ? Goals of care, counseling/discussion 06/17/2017  ? 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 ([place]) 04/29/2017  ? Counseling regarding end of life decision making 07/31/2014  ? Eczema 07/20/2011  ? Right-sided chest wall pain 03/25/2010  ? MICROALBUMINURIA 03/25/2010  ? Osteoporosis 10/08/2009  ? Diet-controlled type 2 diabetes mellitus ([place]) 09/26/2009  ? Allergic rhinitis 09/03/2009  ? ARTHRITIS 09/03/2009  ? CHICKENPOX, HX OF 09/03/2009  ? ? ?Orientation RESPIRATION BLADDER Height & Weight   ?  ?Self, Place, Time ? Normal Incontinent, External catheter Weight: 124 lb 5.4 oz (56.4 kg) ?Height:  4\' 10"  (147.3 cm)  ?BEHAVIORAL SYMPTOMS/MOOD NEUROLOGICAL BOWEL NUTRITION STATUS  ?    Incontinent Diet (See DC summary)  ?AMBULATORY STATUS COMMUNICATION OF NEEDS Skin   ?Extensive Assist Verbally Normal ?  ?  ?  ?    ?     ?     ? ? ?Personal Care Assistance Level of Assistance  ?Bathing, Feeding, Dressing Bathing Assistance: Maximum assistance ?Feeding assistance: Maximum assistance ?Dressing Assistance: Maximum assistance ?   ? ?Functional Limitations Info  ?Sight, Hearing, Speech Sight Info: Adequate (glasses) ?Hearing Info: Adequate ?Speech Info: Adequate  ? ? ?SPECIAL CARE FACTORS FREQUENCY  ?PT (By licensed PT), OT (By licensed OT)   ?  ?PT Frequency: 5x a week ?OT Frequency: 5x a week ?  ?  ?  ?   ? ? ?Contractures Contractures Info: Not present  ? ? ?  Additional Factors Info  ?Code Status, Allergies Code Status Info: DNR ?Allergies Info: Codeine ?  ?  ?  ?   ? ?Current Medications (07/08/2021):  This is the current hospital active medication list ?Current Facility-Administered Medications  ?Medication Dose Route Frequency Provider Last Rate Last Admin  ? acetaminophen (TYLENOL) tablet 650 mg  650 mg Oral Q6H PRN Norval Morton, MD      ? Or  ? acetaminophen (TYLENOL) suppository 650 mg  650 mg Rectal Q6H PRN Fuller Plan A, MD      ? albuterol (PROVENTIL) (2.5 MG/3ML) 0.083% nebulizer solution 2.5 mg  2.5 mg Nebulization Q6H PRN Smith, Rondell A, MD      ? amitriptyline (ELAVIL) tablet 50 mg  50 mg Oral QHS Smith, Rondell A, MD   50 mg at 07/08/21 0004  ? apixaban (ELIQUIS) tablet 5 mg  5 mg Oral BID Fuller Plan A, MD   5 mg at 07/08/21 4268  ? atorvastatin (LIPITOR) tablet 40 mg  40 mg Oral QPM Fuller Plan A, MD   40 mg at 07/07/21 1925  ? dexamethasone (DECADRON) tablet 4 mg  4 mg Oral Q12H Smith, Rondell A, MD   4 mg at 07/08/21 3419  ? docusate sodium (COLACE) capsule 100 mg  100 mg Oral Daily PRN Fuller Plan A, MD      ? famotidine (PEPCID) tablet 20 mg  20 mg Oral BID Fuller Plan A, MD   20 mg at 07/08/21 6222  ? insulin aspart (novoLOG) injection 0-6 Units  0-6 Units Subcutaneous TID WC Norval Morton, MD   2 Units at 07/08/21 1139  ? insulin glargine-yfgn (SEMGLEE) injection 10 Units  10 Units Subcutaneous QHS Theotis Burrow, RPH   10 Units at 07/08/21 0203  ? levETIRAcetam (KEPPRA) tablet 500 mg  500 mg Oral BID Fuller Plan A, MD   500 mg at 07/08/21 9798  ? polyethylene glycol (MIRALAX / GLYCOLAX) packet 17 g  17 g Oral Daily PRN Tamala Julian, Rondell A, MD      ? sodium chloride flush (NS) 0.9 % injection 3 mL  3 mL Intravenous Q12H Smith, Rondell A, MD   3 mL at 07/08/21 0808  ? vitamin B-12 (CYANOCOBALAMIN) tablet 1,000 mcg  1,000 mcg Oral Daily Fuller Plan A, MD   1,000 mcg at 07/08/21 9211  ? ? ? ?Discharge Medications: ?Please see discharge summary for a list of discharge medications. ? ?Relevant Imaging Results: ? ?Relevant Lab Results: ? ? ?Additional Information ?SS#: [phone] ? ?Emeterio Reeve, LCSW ? ? ? ? ?

## 2021-07-08 NOTE — Plan of Care (Signed)
  Problem: Education: Goal: Knowledge of General Education information will improve Description Including pain rating scale, medication(s)/side effects and non-pharmacologic comfort measures Outcome: Progressing   

## 2021-07-08 NOTE — TOC Initial Note (Addendum)
Transition of Care (TOC) - Initial/Assessment Note  ? ? ?Patient Details  ?Name: Katelyn Kennedy ?MRN: [id number] ?Date of Birth: 11-07-1945 ? ?Transition of Care (TOC) CM/SW Contact:    ?Emeterio Reeve, LCSW ?Phone Number: ?07/08/2021, 3:39 PM ? ?Clinical Narrative:                 ? ?CSW received SNF consult. CSW met with pt at bedside. CSW introduced self and explained role at the hospital. Pt reports that PTA she lives at home alone and her sister in laws help.  ? ?CSW reviewed PT/OT recommendations for SNF. Pt reports (she is OK with going to a SNF.  Pt gave CSW permission to fax out to facilities in the area. Pt has no preference of facility at this time. CSW gave pt medicare.gov rating list to review. CSW explained insurance auth process.  ? ?CSW was given permission to speak to [name] and [name]. CSW spoke to both [name] and [name] who both stated they have been providing 24 hour care for patient since November. Both state they cannot and will not continue to offer 24 hour care and have been encouraging pt to pay caregivers to come into the home. They state pt keeps putting it off. CSW also explained pts orientation and her question about capacity. Both stated they would not be her decision makers and it would fall on her daughters [name] or [name]. ? ?CSW explained SNF. They both agree that's what pt needs. [name] shared that pt was recently at [facility] and initially her insurance denied auth, but they won the appeal. Pt used 17 days at [facility], that leaves 3, 100% paid days. If pt is approved for SNF she will be in copay days on day 4. They stated that pt does receive a pension check but they do not know how much. CSW asked family to come up with a back up plan incase she does not get approved for SNF.  ? ?[employer] was approved her for SNF 12/23 after it went to [facility]. [employer] denied her 2/23 and pt lost the appeal for SNF. Pt went home with HHPT. ? ?CSW spoke to pts daughter [name]. CSW explained  conversation that she had with pts sister in laws. [name] stated that she is disappointed in her aunts and doesn't know what to do next. [name] states she is disabled herself and lives in [place] and cannot take care of her. Pt has another daughter [name] that lives in [place]. [name] hopes that pt is approved for SNF and states pt has money to pay for copay days. [name] stated she would want to explore assisted living after SNF. CSW asked [name] to think about plan B. CSW explained that it may mean going to long term care facility or going home and hiring 24/7 care givers. Csw explained that LTC is not covered by insurance and will be out of pocket. [name] states her mother has no POA or Will and is unsure about her finances and what she can afford. [name] states she has brought up the [place] conversation multiple times but pt refuses to talk about it.  ? ?[name] also shared that APS is involved. She does not know the name of the worker. [name] reports her sister [name] called APS on [place] and [name] after they said they would no longer care for pt. [name] and [name] continued to care for pt. [name] believes the case is still open.  ? ?CSW will continue to follow. ? ?  Expected Discharge Plan: [place] ?Barriers to Discharge: Continued Medical Work up, Ship broker, Family Issues ? ? ?Patient Goals and CMS Choice ?Patient states their goals for this hospitalization and ongoing recovery are:: To go to rehab ?CMS Medicare.gov Compare Post Acute Care list provided to:: Patient ?Choice offered to / list presented to : Patient ? ?Expected Discharge Plan and Services ?Expected Discharge Plan: [place] ?  ?  ?  ?Living arrangements for the past 2 months: [place] ?                ?  ?  ?  ?  ?  ?  ?  ?  ?  ?  ? ?Prior Living Arrangements/Services ?Living arrangements for the past 2 months: [place] ?Lives with:: Other (Comment) (Two sister in laws and niece were rotating to give 24 hour  care) ?Patient language and need for interpreter reviewed:: Yes ?Do you feel safe going back to the place where you live?: Yes      ?Need for Family Participation in Patient Care: Yes (Comment) ?Care giver support system in place?: No (comment) (Family can no longer care for patient) ?  ?Criminal Activity/Legal Involvement Pertinent to Current Situation/Hospitalization: No - Comment as needed ? ?Activities of Daily Living ?Home Assistive Devices/Equipment: CBG Meter, Walker (specify type) ([place]) ?ADL Screening (condition at time of admission) ?Patient's cognitive ability adequate to safely complete daily activities?: Yes ?Is the patient deaf or have difficulty hearing?: No ?Does the patient have difficulty seeing, even when wearing glasses/contacts?: No ?Does the patient have difficulty concentrating, remembering, or making decisions?: No ?Patient able to express need for assistance with ADLs?: Yes ?Does the patient have difficulty dressing or bathing?: Yes ?Independently performs ADLs?: No ?Communication: Independent ?Dressing (OT): Needs assistance ?Is this a change from baseline?: Pre-admission baseline ?Grooming: Independent ?Feeding: Independent ?Bathing: Needs assistance ?Is this a change from baseline?: Pre-admission baseline ?Toileting: Needs assistance ?Is this a change from baseline?: Pre-admission baseline ?In/Out Bed: Needs assistance ?Is this a change from baseline?: Change from baseline, expected to last >3 days ?Walks in Home: Needs assistance ?Is this a change from baseline?: Change from baseline, expected to last >3 days ?Does the patient have difficulty walking or climbing stairs?: Yes ?Weakness of Legs: Left ?Weakness of Arms/Hands: Left ? ?Permission Sought/Granted ?Permission sought to share information with : Facility Sport and exercise psychologist, Family Supports ?Permission granted to share information with : Yes, Verbal Permission Granted ?   ? Permission granted to share info w AGENCY: SNF ?    ?   ? ?Emotional Assessment ?Appearance:: Appears stated age ?Attitude/Demeanor/Rapport: Engaged ?Affect (typically observed): Appropriate ?Orientation: : Oriented to Self, Oriented to Place, Oriented to Situation ?Alcohol / Substance Use: Not Applicable ?Psych Involvement: No (comment) ? ?Admission diagnosis:  Weakness [R53.1] ?Fall from bed, initial encounter [W06.XXXA] ?Patient Active Problem List  ? Diagnosis Date Noted  ? Fall at home secondary to weakness 07/07/2021  ? Normocytic anemia 07/07/2021  ? Leukocytosis 07/07/2021  ? History of DVT (deep vein thrombosis) 07/07/2021  ? History of pulmonary embolism and DVT on chronic anticoagulation 07/07/2021  ? Left-sided weakness 05/31/2021  ? Weakness of left side of body 05/30/2021  ? DVT (deep venous thrombosis) ([place]) 05/30/2021  ? HTN (hypertension) 05/30/2021  ? Acute deep vein thrombosis (DVT) of proximal vein of left lower extremity ([place]) 05/15/2021  ? Brain metastasis ([place]) 05/15/2021  ? Pulmonary embolism ([place]) 05/08/2021  ? Acute pulmonary embolism ([place]) 05/08/2021  ?  Orthostatic hypotension 04/01/2021  ? Fall 04/01/2021  ? Fall at home, initial encounter 03/31/2021  ? Pressure injury of skin 03/07/2021  ? Intraparenchymal hemorrhage of brain ([place]) 03/05/2021  ? Vasogenic brain edema ([place]) 03/05/2021  ? Midline shift of brain 03/05/2021  ? Chronic anticoagulation 03/05/2021  ? Non-small cell lung cancer metastatic to brain [facility]) with vasogenic brain edema 03/05/2021  ? History of lung cancer 10/15/2020  ? Atherosclerosis of aorta ([place]) 10/15/2020  ? Pain in lateral left lower extremity 04/30/2020  ? Multiple thyroid nodules 04/03/2019  ? History of bladder cancer 09/30/2018  ? Encounter for antineoplastic immunotherapy 09/13/2017  ? Chronic insomnia 08/17/2017  ? Encounter for antineoplastic chemotherapy 06/17/2017  ? Goals of care, counseling/discussion 06/17/2017  ? 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 ([place]) 04/29/2017  ? Counseling  regarding end of life decision making 07/31/2014  ? Eczema 07/20/2011  ? Right-sided chest wall pain 03/25/2010  ? MICROALBUMINURIA 03/25/2010  ? Osteoporosis 10/08/2009  ? Diet-controlled type 2 diabetes [place]

## 2021-07-09 ENCOUNTER — Encounter (HOSPITAL_COMMUNITY): Payer: Self-pay | Admitting: Internal Medicine

## 2021-07-09 DIAGNOSIS — R531 Weakness: Secondary | ICD-10-CM | POA: Diagnosis not present

## 2021-07-09 LAB — GLUCOSE, CAPILLARY
Glucose-Capillary: 173 mg/dL — ABNORMAL HIGH (ref 70–99)
Glucose-Capillary: 305 mg/dL — ABNORMAL HIGH (ref 70–99)
Glucose-Capillary: 201 mg/dL — ABNORMAL HIGH (ref 70–99)
Glucose-Capillary: 293 mg/dL — ABNORMAL HIGH (ref 70–99)

## 2021-07-09 MED ORDER — ALBUTEROL SULFATE (2.5 MG/3ML) 0.083% IN NEBU: 2.5000 mg | INHALATION_SOLUTION | Freq: Four times a day (QID) | RESPIRATORY_TRACT | 12 refills | Status: AC | PRN

## 2021-07-09 MED ORDER — DEXAMETHASONE 4 MG PO TABS: 4.0000 mg | ORAL_TABLET | Freq: Two times a day (BID) | ORAL | 0 refills | Status: DC

## 2021-07-09 NOTE — Discharge Summary (Addendum)
Physician Discharge Summary  ?Katelyn Kennedy CLE:[id number] DOB: 1946/01/14 DOA: 07/07/2021 ? ?PCP: Katelyn Sanders, MD ? ?Admit date: 07/07/2021 ?Discharge date: 07/09/2021 ? ?Time spent: 45 minutes ? ?Recommendations for Outpatient Follow-up:  ?Follow-up with primary care physician in 1 to 2 weeks ?Follow-up with oncologist as previously scheduled ? ?Discharge Diagnoses:  ?Principal Problem: ?  Fall at home secondary to weakness ?Active Problems: ?  Diet-controlled type 2 diabetes mellitus ([place]) ?  Vasogenic brain edema ([place]) ?  Chronic anticoagulation ?  Non-small cell lung cancer metastatic to brain [facility]) with vasogenic brain edema ?  Normocytic anemia ?  Leukocytosis ?  History of DVT (deep vein thrombosis) ?  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 ? ?Discharge Condition: Stable but guarded ? ? ?Filed Weights  ? 07/07/21 0238 07/08/21 0326  ?Weight: (P) 57.6 kg 56.4 kg  ? ? ?History of present illness:  ?Katelyn Kennedy is a 76 y.o. 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  Patient reports History is obtained from the patient as well as her sister-in-law over the phone.  3 family members have been rotating to try and provide 24-hour care since November 2022.  Two of those family members are over the age of 12 and have difficulty with picking the patient up, and she reports that on 3/31 the rotation is supposed to stop.  At baseline the patient needs assistance with all of her ADLs, but have been able to ambulate with the use of a walker some up until about 3 days ago.  Of note she had been told to taper off of her steroids and had taken her last dose yesterday.  Since the tapering of her steroids patient has become more  weak and family noted that she was not able to use her left side much at all.  Unable to get up or move out of bed yesterday.  When she tried to get up this morning she slid out of bed and denies having sustaining any injuries.  Her sister-in-law was present, but unable to get her up for which she called EMS.  Patient reports that she had been eating and drinking okay.  She has a chronic intermittent mostly nonproductive cough that is unchanged.  Denies having any fever, chills, nausea, vomiting, or diarrhea.  Although her sister-in-law states that she has had some incontinence.Patient had been hospitalized in January this year where she was found to have the pulmonary embolus and subsequently in February of this year for worsening left-sided weakness. ?  ?Upon admission into the emergency department patient was noted to be afebrile with heart rates to 110 and all other vital signs maintained.  Labs significant for WBC 10.6 and hemoglobin 11.1.  Chest x-ray noted ill-defined opacity of the perihilar right lung and right lung base increased in prominence concerning for superimposed airspace disease or recurrent tumor cannot be excluded.  CT scan of the head noted extensive dural based hemorrhagic metastatic disease without significant change in extensive vasogenic edema or midline shift small volume extra axonal hemorrhage along the right frontal convexity with superimposed traumatic hemorrhage difficult to fully exclude. ? ?Hospital Course:  ? ?Patient was admitted had physical therapy evaluation.  Neurosurgery called with recommendations  stated.  Palliative care team saw patient she was not open to hospice at this time.  Goals of care were discussed she is DNR.  Patient would like short-term rehab which is being recommended by physical therapy.  Patient will be discharged to short-term rehab when bed available with appropriate follow-up with her primary care physician and her oncology team.  Patient has overall  very poor prognosis and continued goals of care will need to be discussed over the next couple months. ? ? ?Fall at home secondary to weakness ?Patient presents after having a fall at home with reports of worsening left-sided weakness related with brain metastases with prior resection.  She had just recently been tapered off of Decadron.  CT imaging showing continued presence of brain mets with vasogenic edema and leftward shift of the brain. ?Case was discussed with Dr. Mickeal Kennedy who recommended resuming Decadron 4 mg twice daily and continue anticoagulation.  Patient has been given Decadron 10 mg IV x1 dose. ?-Admit to a telemetry bed ?-Decadron 4 mg twice daily ?-PT to eval and treat recommending short-term rehab ?-Patient declined hospice ?-Patient had no new neurological deficits during hospitalization ? 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 ?-Continue Eliquis  ?  ?Leukocytosis ?Acute.  WBC elevated at 10.6, but appears to be trending down from last check on 3/1 at 13.7.  Possibly related with patient being on steroids. ?-No clear source of infection at this time ?-Continue to monitor ?  ?Normocytic anemia ?Chronic.  Hemoglobin 11.1 g/dL which appears near patient's baseline ?-Continue to monitor ?  ?Non-small cell lung cancer metastatic to brain [facility]) with vasogenic brain edema ?Patient had initially been diagnosed with suspicious metastatic pulmonary nodules and February 2019 with evidence of metastatic disease with solitary brain metastasis in July 2022.  Patient has been treated with chemoradiation and resection of the right posterior frontal dural metastasis. ?-Continue Keppra ?-Decadron as recommended by Dr. Mickeal Kennedy neurosurgery ?-Appreciate palliative care consultative services ?  ?Diet-controlled type 2 diabetes mellitus  ([place]) ?Patient had been relatively well controlled.  Last hemoglobin A1c 6.5 on 06/04/2021.  Home regimen included Lantus 10 units nightly ? ? ?Discharge Exam: ?Vitals:  ? 07/09/21 0554 07/09/21 0804  ?BP: 121/75 108/65  ?Pulse: 86 98  ?Resp: 19 16  ?Temp: 97.7 ?F (36.5 ?C) 98.1 ?F (36.7 ?C)  ?SpO2: 99% 97%  ? ? ?General: Alert and oriented no apparent distress ?Cardiovascular: Regular rate and rhythm without murmurs rubs or gallops ?Respiratory: Clear to auscultation bilaterally no wheezes rhonchi or rales ? ?Discharge Instructions ? ? ?Discharge Instructions   ? ? Diet - low sodium heart healthy   Complete by: As directed ?  ? Discharge instructions   Complete by: As directed ?  ? Follow-up with your oncologist as previously planned ? ?Follow-up with primary care physician in 1 to 2 weeks  ? Increase activity slowly   Complete by: As directed ?  ? ?  ? ?Allergies as of 07/09/2021   ? ?   Reactions  ? Codeine Nausea Only  ? ?  ? ?  ?Medication List  ?  ? ?TAKE these medications   ? ?Accu-Chek Guide test strip ?Generic drug: glucose blood ?Check blood sugar three times a day ?  ?Accu-Chek Softclix Lancets lancets ?Check blood sugar three times a day ?  ?albuterol (  2.5 MG/3ML) 0.083% nebulizer solution ?Commonly known as: PROVENTIL ?Take 3 mLs (2.5 mg total) by nebulization every 6 (six) hours as needed for wheezing or shortness of breath. ?  ?amitriptyline 50 MG tablet ?Commonly known as: ELAVIL ?Take 1 tablet (50 mg total) by mouth at bedtime. ?  ?apixaban 5 MG Tabs tablet ?Commonly known as: ELIQUIS ?Take 1 tablet (5 mg total) by mouth 2 (two) times daily. ?  ?atorvastatin 40 MG tablet ?Commonly known as: LIPITOR ?1 tablet  po daily ?What changed:  ?how much to take ?how to take this ?when to take this ?additional instructions ?  ?blood glucose meter kit and supplies Kit ?Dispense based on patient and insurance preference. Use up to four times daily as directed. ?  ?cholecalciferol 25 MCG (1000 UNIT)  tablet ?Commonly known as: VITAMIN D ?Take 1,000 Units by mouth daily. ?  ?dexamethasone 4 MG tablet ?Commonly known as: DECADRON ?Take 1 tablet (4 mg total) by mouth every 12 (twelve) hours. ?What changed:  ?medicati

## 2021-07-09 NOTE — Progress Notes (Addendum)
Mobility Specialist Progress Note: ? ? 07/09/21 1552  ?Mobility  ?Activity Transferred from chair to bed  ?Level of Assistance Moderate assist, patient does 50-74%  ?Assistive Device Front wheel walker  ?Distance Ambulated (ft) 4 ft  ?Activity Response Tolerated well  ?$Mobility charge 1 Mobility  ? ?Pt received in chair wanting to go back to bed. No complaints of pain. ModA +2 to stand and step to bed. Left in bed with call bell in reach and all needs met.  ? ?Katelyn Kennedy ?Mobility Specialist ?Primary Phone [phone] ? ?

## 2021-07-09 NOTE — TOC Progression Note (Signed)
Transition of Care (TOC) - Progression Note  ? ? ?Patient Details  ?Name: Katelyn Kennedy ?MRN: [id number] ?Date of Birth: 03/31/1946 ? ?Transition of Care (TOC) CM/SW Contact  ?Coralee Pesa, LCSWA ?Phone Number: ?07/09/2021, 11:51 AM ? ?Clinical Narrative:    ? ?CSW spoke with APS who noted pt's case had been closed as there was no sign of exploitation and pt's family should make decisions. CSW discussed barriers with Facility and [facility] noted they will work with the family on copays and are willing to accept pt back. CSW started auth with [employer] to see if it can be approved. CSW attempted to update dtr, [name], VM was left. TOC will continue to follow for DC needs. ? ?Expected Discharge Plan: [place] ?Barriers to Discharge: Continued Medical Work up, Ship broker, Family Issues ? ?Expected Discharge Plan and Services ?Expected Discharge Plan: [place] ?  ?  ?  ?Living arrangements for the past 2 months: [place] ?                ?  ?  ?  ?  ?  ?  ?  ?  ?  ?  ? ? ?Social Determinants of Health (SDOH) Interventions ?  ? ?Readmission Risk Interventions ? ?  03/19/2021  ?  2:43 PM 11/08/2020  ?  3:25 PM  ?Readmission Risk Prevention Plan  ?Post Dischage Appt  Complete  ?Medication Screening  Complete  ?Transportation Screening Complete Complete  ?PCP or Specialist Appt within 3-5 Days Complete   ?[employer] or Home Care Consult Complete   ?Social Work Consult for [place] Planning/Counseling Complete   ?Palliative Care Screening Not Applicable   ?Medication Review [employer]) Complete   ? ? ?

## 2021-07-09 NOTE — Evaluation (Signed)
Occupational Therapy Evaluation ?Patient Details ?Name: Katelyn Kennedy ?MRN: [id number] ?DOB: July 08, 1945 ?Today's Date: 07/09/2021 ? ? ?History of Present Illness Pt is a 76 y.o. female admitted 07/07/21 after fall out of bed with reports of worsening L-side weakness related to brain metastases with prior resection. Head CT showing continued presence of brain mets with vasogenic edema and leftward shift of the brain. 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  ? ?Clinical Impression ?  ?Pt ambulated with a rollator and was assisted for ADLs and IADLs, she is unable to offer specifics. Pt is generally weak with L side weakness, L inattention, decreased motor planning and impaired standing balance. She demonstrates impaired cognition and awareness of deficits. She needs set up to total assist for ADLs and +2 mod assist for transfers with increased time. Pt does not have 24 hour care in the home. Recommending SNF.  ?   ? ?Recommendations for follow up therapy are one component of a multi-disciplinary discharge planning process, led by the attending physician.  Recommendations may be updated based on patient status, additional functional criteria and insurance authorization.  ? ?Follow Up Recommendations ? Skilled nursing-short term rehab (<3 hours/day)  ?  ?Assistance Recommended at Discharge Frequent or constant Supervision/Assistance  ?Patient can return home with the following Two people to help with walking and/or transfers;A lot of help with bathing/dressing/bathroom;Assistance with cooking/housework;Assistance with feeding;Direct supervision/assist for medications management;Direct supervision/assist for financial management;Assist for transportation;Help with stairs or ramp for entrance ? ?  ?Functional Status Assessment ? Patient has had a recent decline in their functional status and/or demonstrates limited ability to make  significant improvements in function in a reasonable and predictable amount of time  ?Equipment Recommendations ? Other (comment) (defer to next venue)  ?  ?Recommendations for Other Services   ? ? ?  ?Precautions / Restrictions Precautions ?Precautions: Fall;Other (comment) ?Precaution Comments: h/o lung CA with brain mets (s/p resection R posterior frontal dural metastasis) with L-side weakness and inattention ?Restrictions ?Weight Bearing Restrictions: No  ? ?  ? ?Mobility Bed Mobility ?Overal bed mobility: Needs Assistance ?Bed Mobility: Supine to Sit ?  ?  ?Supine to sit: Mod assist, HOB elevated ?  ?  ?General bed mobility comments: difficulty initiating and sequencing movement out of L-side bed, continuing to reach for R-side rail; modA for trunk elevation and scooting L hip to EOB ?  ? ?Transfers ?Overall transfer level: Needs assistance ?Equipment used: Rolling walker (2 wheels) ?Transfers: Sit to/from Stand ?Sit to Stand: Mod assist, +2 physical assistance, From elevated surface ?  ?  ?  ?  ?  ?General transfer comment: assist to rise and steady, pt with posterior bias, requires increased time and assist to manage walker to side step and pivot to chair ?  ? ?  ?Balance Overall balance assessment: Needs assistance ?Sitting-balance support: Bilateral upper extremity supported, Feet supported ?Sitting balance-Leahy Scale: Fair ?  ?  ?Standing balance support: Bilateral upper extremity supported ?Standing balance-Leahy Scale: Poor ?  ?  ?  ?  ?  ?  ?  ?  ?  ?  ?  ?  ?   ? ?ADL either performed or assessed with clinical judgement  ? ?ADL Overall ADL's : Needs assistance/impaired ?Eating/Feeding: Minimal assistance;Sitting ?Eating/Feeding Details (indicate cue type and reason): assist to cut food and open containers ?Grooming: Wash/dry hands;Sitting;Set up ?  ?Upper Body Bathing: Moderate assistance;Sitting ?  ?Lower Body  Bathing: Total assistance;Sit to/from stand ?  ?Upper Body Dressing : Moderate  assistance;Sitting ?  ?Lower Body Dressing: Total assistance;Sit to/from stand ?  ?Toilet Transfer: +2 for physical assistance;Moderate assistance;Stand-pivot;Rolling walker (2 wheels) ?Toilet Transfer Details (indicate cue type and reason): simulated to chair ?Toileting- Clothing Manipulation and Hygiene: Total assistance;Sit to/from stand ?  ?  ?  ?  ?   ? ? ? ?Vision Baseline Vision/History: 1 Wears glasses ?Ability to See in Adequate Light: 0 Adequate ?Patient Visual Report: No change from baseline ?   ?   ?Perception   ?  ?Praxis Praxis ?Praxis-Other Comments: impaired motor planning, needs verbal cues to sequence ?  ? ?Pertinent Vitals/Pain Pain Assessment ?Pain Assessment: No/denies pain  ? ? ? ?Hand Dominance Right ?  ?Extremity/Trunk Assessment Upper Extremity Assessment ?Upper Extremity Assessment: LUE deficits/detail ?LUE Deficits / Details: uses L UE spontaneously in B UE ADLs, weakness greater proximal vs distal, impaired sensation ?LUE Sensation: decreased proprioception;decreased light touch ?LUE Coordination: decreased gross motor ?  ?Lower Extremity Assessment ?Lower Extremity Assessment: Defer to PT evaluation ?  ?  ?  ?Communication Communication ?Communication: Expressive difficulties ?  ?Cognition Arousal/Alertness: Awake/alert ?Behavior During Therapy: Flat affect ?Overall Cognitive Status: No family/caregiver present to determine baseline cognitive functioning ?  ?  ?  ?  ?  ?  ?  ?  ?  ?  ?  ?  ?  ?  ?  ?  ?  ?  ?  ?General Comments   ? ?  ?Exercises   ?  ?Shoulder Instructions    ? ? ?Home Living Family/patient expects to be discharged to:: Private residence ?Living Arrangements: Other relatives;Children ?Available Help at Discharge: Family;Friend(s);Available 24 hours/day ?Type of Home: Apartment ?Home Access: Stairs to enter ?Entrance Stairs-Number of Steps: 1 ?Entrance Stairs-Rails: None ?Home Layout: One level ?  ?  ?Bathroom Shower/Tub: Tub/shower unit ?  ?Bathroom Toilet: Standard ?  ?   ?Home Equipment: Rollator (4 wheels);Shower seat;Hand held shower head;Grab bars - tub/shower;Grab bars - toilet ?  ?  ?  ? ?  ?Prior Functioning/Environment Prior Level of Function : Needs assist ?  ?  ?  ?  ?  ?  ?Mobility Comments: pt reports she uses RW, but it is not where she can reach it frequently ?ADLs Comments: Pt assisted for ADLs and IADLs by caregivers. ?  ? ?  ?  ?OT Problem List: Decreased strength;Impaired balance (sitting and/or standing);Decreased coordination;Decreased cognition;Decreased knowledge of use of DME or AE;Impaired UE functional use ?  ?   ?OT Treatment/Interventions: Self-care/ADL training;DME and/or AE instruction;Therapeutic activities;Cognitive remediation/compensation;Patient/family education;Balance training  ?  ?OT Goals(Current goals can be found in the care plan section) Acute Rehab OT Goals ?OT Goal Formulation: With patient ?Time For Goal Achievement: 07/23/21 ?Potential to Achieve Goals: Good  ?OT Frequency: Min 2X/week ?  ? ?Co-evaluation PT/OT/SLP Co-Evaluation/Treatment: Yes ?Reason for Co-Treatment: For patient/therapist safety;Complexity of the patient's impairments (multi-system involvement) ?  ?OT goals addressed during session: ADL's and self-care ?  ? ?  ?AM-PAC OT "6 Clicks" Daily Activity     ?Outcome Measure Help from another person eating meals?: A Little ?Help from another person taking care of personal grooming?: A Little ?Help from another person toileting, which includes using toliet, bedpan, or urinal?: Total ?Help from another person bathing (including washing, rinsing, drying)?: A Lot ?Help from another person to put on and taking off regular upper body clothing?: A Lot ?Help from another person to put on  and taking off regular lower body clothing?: Total ?6 Click Score: 12 ?  ?End of Session Equipment Utilized During Treatment: Gait belt;Rolling walker (2 wheels) ?Nurse Communication: Mobility status ? ?Activity Tolerance: Patient tolerated treatment  well ?Patient left: in chair;with call bell/phone within reach;with chair alarm set ? ?OT Visit Diagnosis: Other abnormalities of gait and mobility (R26.89);Unsteadiness on feet (R26.81);Muscle weakness (generalized) (

## 2021-07-09 NOTE — Progress Notes (Signed)
Protection (ACC) ?Hospital Liaison Note ? ?Notified by Assunta Curtis of patient/family request for [facility] Palliative services at home after discharge.  ? ?[facility] liaison will follow patient for discharge disposition.  ? ?Please call with any Hospice/Palliative related questions or concerns.  ? ?Thank you for the opportunity to participate in this patient's care ? ?Misty Green RN, Copywriter, advertising, WTA-C ?Hospital Liaison ?[phone] ?

## 2021-07-09 NOTE — Progress Notes (Signed)
Physical Therapy Treatment ?Patient Details ?Name: Katelyn Kennedy ?MRN: [id number] ?DOB: [birth year] ?Today's Date: 07/09/2021 ? ? ?History of Present Illness Pt is a 76 y.o. female admitted 07/07/21 after fall out of bed with reports of worsening L-side weakness related to brain metastases with prior resection. Head CT showing continued presence of brain mets with vasogenic edema and leftward shift of the brain. 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 ?  ?PT Comments  ? ? Pt progressing with mobility. Today's session focused on transfer training with L-side movement initiation; pt requiring modA+2 and significant increased time for mobility; pt demonstrates slowed processing and difficulty motor planning. Pt remains limited by generalized weakness, decreased activity tolerance, poor balance strategies/postural reactions and impaired cognition. Continue to recommend SNF-level therapies to maximize functional mobility and independence prior to return home. ?   ?Recommendations for follow up therapy are one component of a multi-disciplinary discharge planning process, led by the attending physician.  Recommendations may be updated based on patient status, additional functional criteria and insurance authorization. ? ?Follow Up Recommendations ? Skilled nursing-short term rehab (<3 hours/day) ?  ?  ?Assistance Recommended at Discharge Frequent or constant Supervision/Assistance  ?Patient can return home with the following A lot of help with walking and/or transfers;A lot of help with bathing/dressing/bathroom;Assistance with cooking/housework;Direct supervision/assist for financial management;Direct supervision/assist for medications management;Assist for transportation;Help with stairs or ramp for entrance ?  ?Equipment Recommendations ? Wheelchair (measurements PT);Wheelchair cushion (measurements PT);Hospital bed  ?  ?Recommendations  for Other Services   ? ? ?  ?Precautions / Restrictions Precautions ?Precautions: Fall;Other (comment) ?Precaution Comments: h/o lung CA with brain mets (s/p resection R posterior frontal dural metastasis) with L-side weakness and inattention ?Restrictions ?Weight Bearing Restrictions: No  ?  ? ?Mobility ? Bed Mobility ?Overal bed mobility: Needs Assistance ?Bed Mobility: Supine to Sit ?  ?  ?Supine to sit: Mod assist, HOB elevated ?  ?  ?General bed mobility comments: difficulty initiating and sequencing movement out of L-side bed, continuing to reach for R-side rail; modA for trunk elevation and scooting L hip to EOB ?  ? ?Transfers ?Overall transfer level: Needs assistance ?Equipment used: Rolling walker (2 wheels) ?Transfers: Sit to/from Stand, Bed to chair/wheelchair/BSC ?Sit to Stand: Mod assist, +2 physical assistance, From elevated surface ?  ?  ?  ?  ?  ?General transfer comment: assist to rise and steady, pt with R-lateral/posterior bias, requires increased time and assist to manage walker to side step and pivot to chair; repeated cues for sequencing, especially related to LLE ?  ? ?Ambulation/Gait ?  ?  ?  ?  ?  ?  ?  ?  ? ? ?Stairs ?  ?  ?  ?  ?  ? ? ?Wheelchair Mobility ?  ? ?Modified Rankin (Stroke Patients Only) ?  ? ? ?  ?Balance Overall balance assessment: Needs assistance ?Sitting-balance support: Bilateral upper extremity supported, Feet supported ?Sitting balance-Leahy Scale: Fair ?  ?  ?Standing balance support: Bilateral upper extremity supported, During functional activity ?Standing balance-Leahy Scale: Poor ?  ?  ?  ?  ?  ?  ?  ?  ?  ?  ?  ?  ?  ? ?  ?Cognition Arousal/Alertness: Awake/alert ?Behavior During Therapy: Flat affect ?Overall Cognitive Status: No family/caregiver present to determine baseline cognitive functioning ?  ?  ?  ?  ?  ?  ?  ?  ?  ?  ?  ?  ?  ?  ?  ?  ?  General Comments: slow processing, suspect L-side inattention requiring cues to use LUE/LLE (visually able to find all  objects on L-side tray), increased time to initiate movement with cues; poor motor planning ?  ?  ? ?  ?Exercises   ? ?  ?General Comments General comments (skin integrity, edema, etc.): assist for opening objects on tray, pt with poor motor control of L hand ?  ?  ? ?Pertinent Vitals/Pain Pain Assessment ?Pain Assessment: No/denies pain  ? ? ?Home Living Family/patient expects to be discharged to:: Private residence ?Living Arrangements: Other relatives;Children ?Available Help at Discharge: Family;Friend(s);Available 24 hours/day ?Type of Home: Apartment ?Home Access: Stairs to enter ?Entrance Stairs-Rails: None ?Entrance Stairs-Number of Steps: 1 ?  ?Home Layout: One level ?Home Equipment: Rollator (4 wheels);Shower seat;Hand held shower head;Grab bars - tub/shower;Grab bars - toilet ?   ?  ?Prior Function    ?  ?  ?   ? ?PT Goals (current goals can now be found in the care plan section) Progress towards PT goals: Progressing toward goals ? ?  ?Frequency ? ? ? Min 3X/week ? ? ? ?  ?PT Plan Frequency needs to be updated  ? ? ?Co-evaluation   ?Reason for Co-Treatment: For patient/therapist safety;Complexity of the patient's impairments (multi-system involvement) ?  ?OT goals addressed during session: ADL's and self-care ?  ? ?  ?AM-PAC PT "6 Clicks" Mobility   ?Outcome Measure ? Help needed turning from your back to your side while in a flat bed without using bedrails?: A Lot ?Help needed moving from lying on your back to sitting on the side of a flat bed without using bedrails?: A Lot ?Help needed moving to and from a bed to a chair (including a wheelchair)?: Total ?Help needed standing up from a chair using your arms (e.g., wheelchair or bedside chair)?: Total ?Help needed to walk in hospital room?: Total ?Help needed climbing 3-5 steps with a railing? : Total ?6 Click Score: 8 ? ?  ?End of Session Equipment Utilized During Treatment: Gait belt ?Activity Tolerance: Patient tolerated treatment well ?Patient left:  in chair;with call bell/phone within reach;with chair alarm set ?Nurse Communication: Mobility status ?PT Visit Diagnosis: Unsteadiness on feet (R26.81);Muscle weakness (generalized) (M62.81);Repeated falls (R29.6);History of falling (Z91.81) ?  ? ? ?Time: 8676-1950 ?PT Time Calculation (min) (ACUTE ONLY): 29 min ? ?Charges:  $Therapeutic Activity: 8-22 mins          ?          ? ?Mabeline Caras, PT, DPT ?Acute Rehabilitation Services  ?Pager [phone] ?Office [phone] ? ?Derry Lory ?07/09/2021, 10:15 AM ? ?

## 2021-07-09 NOTE — Progress Notes (Signed)
Inpatient Diabetes Program Recommendations ? ?AACE/ADA: New Consensus Statement on Inpatient Glycemic Control (2015) ? ?Target Ranges:  Prepandial:   less than 140 mg/dL ?     Peak postprandial:   less than 180 mg/dL (1-2 hours) ?     Critically ill patients:  140 - 180 mg/dL  ? ?Lab Results  ?Component Value Date  ? GLUCAP 305 (H) 07/09/2021  ? HGBA1C 6.5 05/15/2021  ? ? ?Review of Glycemic Control ? Latest Reference Range & Units 07/08/21 07:28 07/08/21 11:03 07/08/21 16:12 07/08/21 21:31 07/09/21 08:36 07/09/21 11:24  ?Glucose-Capillary 70 - 99 mg/dL 162 (H) ?Novolog 1 unit 201 (H) ?Novolog 2 units 273 (H) ?Novolog 3 units 168 (H) 173 (H) ?Novolog 1 unit 305 (H)  ?(H): Data is abnormally high ? ?Diabetes history: DM2 ?Outpatient Diabetes medications: Lantus 10 units q hs ?Current orders for Inpatient glycemic control: Semglee 10 units qd, Novolog 0-6 units tid correction, Decadron 4 mg q 12 hrs. ? ?Inpatient Diabetes Program Recommendations:   ? ?Noted postprandial CBGs elevated. Please consider while on steroids: ?-Novolog 2 units tid meal coverage if eats 50% meal ?Secure chat sent to Dr. Shanon Brow. ? ?Thank you, ?Katelyn Gasser Aundre Hietala, RN, MSN, CDE  ?Diabetes Coordinator ?Inpatient Glycemic Control Team ?Team Pager [phone] (8am-5pm) ?07/09/2021 11:44 AM' ? ? ? ?

## 2021-07-10 ENCOUNTER — Inpatient Hospital Stay: Payer: PPO | Admitting: Nurse Practitioner

## 2021-07-10 DIAGNOSIS — R9089 Other abnormal findings on diagnostic imaging of central nervous system: Secondary | ICD-10-CM | POA: Diagnosis not present

## 2021-07-10 DIAGNOSIS — E785 Hyperlipidemia, unspecified: Secondary | ICD-10-CM | POA: Diagnosis present

## 2021-07-10 DIAGNOSIS — E1151 Type 2 diabetes mellitus with diabetic peripheral angiopathy without gangrene: Secondary | ICD-10-CM | POA: Diagnosis not present

## 2021-07-10 DIAGNOSIS — G936 Cerebral edema: Secondary | ICD-10-CM | POA: Diagnosis not present

## 2021-07-10 DIAGNOSIS — R531 Weakness: Secondary | ICD-10-CM | POA: Diagnosis present

## 2021-07-10 DIAGNOSIS — Z86718 Personal history of other venous thrombosis and embolism: Secondary | ICD-10-CM | POA: Diagnosis not present

## 2021-07-10 DIAGNOSIS — Z87891 Personal history of nicotine dependence: Secondary | ICD-10-CM | POA: Diagnosis not present

## 2021-07-10 DIAGNOSIS — Z66 Do not resuscitate: Secondary | ICD-10-CM | POA: Diagnosis not present

## 2021-07-10 DIAGNOSIS — E11649 Type 2 diabetes mellitus with hypoglycemia without coma: Secondary | ICD-10-CM | POA: Diagnosis not present

## 2021-07-10 DIAGNOSIS — Z885 Allergy status to narcotic agent status: Secondary | ICD-10-CM | POA: Diagnosis not present

## 2021-07-10 DIAGNOSIS — Z9842 Cataract extraction status, left eye: Secondary | ICD-10-CM | POA: Diagnosis not present

## 2021-07-10 DIAGNOSIS — Z7189 Other specified counseling: Secondary | ICD-10-CM | POA: Diagnosis not present

## 2021-07-10 DIAGNOSIS — I619 Nontraumatic intracerebral hemorrhage, unspecified: Secondary | ICD-10-CM | POA: Diagnosis not present

## 2021-07-10 DIAGNOSIS — Z833 Family history of diabetes mellitus: Secondary | ICD-10-CM | POA: Diagnosis not present

## 2021-07-10 DIAGNOSIS — W06XXXA Fall from bed, initial encounter: Secondary | ICD-10-CM | POA: Diagnosis present

## 2021-07-10 DIAGNOSIS — D72829 Elevated white blood cell count, unspecified: Secondary | ICD-10-CM | POA: Diagnosis not present

## 2021-07-10 DIAGNOSIS — Z1330 Encounter for screening examination for mental health and behavioral disorders, unspecified: Secondary | ICD-10-CM | POA: Diagnosis not present

## 2021-07-10 DIAGNOSIS — S065XAA Traumatic subdural hemorrhage with loss of consciousness status unknown, initial encounter: Secondary | ICD-10-CM | POA: Diagnosis not present

## 2021-07-10 DIAGNOSIS — Z515 Encounter for palliative care: Secondary | ICD-10-CM | POA: Diagnosis not present

## 2021-07-10 DIAGNOSIS — S061XAA Traumatic cerebral edema with loss of consciousness status unknown, initial encounter: Secondary | ICD-10-CM | POA: Diagnosis not present

## 2021-07-10 DIAGNOSIS — Z86711 Personal history of pulmonary embolism: Secondary | ICD-10-CM | POA: Diagnosis not present

## 2021-07-10 DIAGNOSIS — D649 Anemia, unspecified: Secondary | ICD-10-CM | POA: Diagnosis not present

## 2021-07-10 DIAGNOSIS — M6281 Muscle weakness (generalized): Secondary | ICD-10-CM | POA: Diagnosis not present

## 2021-07-10 DIAGNOSIS — Z9841 Cataract extraction status, right eye: Secondary | ICD-10-CM | POA: Diagnosis not present

## 2021-07-10 DIAGNOSIS — I10 Essential (primary) hypertension: Secondary | ICD-10-CM | POA: Diagnosis not present

## 2021-07-10 DIAGNOSIS — C3490 Malignant neoplasm of unspecified part of unspecified bronchus or lung: Secondary | ICD-10-CM | POA: Diagnosis not present

## 2021-07-10 DIAGNOSIS — Z85118 Personal history of other malignant neoplasm of bronchus and lung: Secondary | ICD-10-CM | POA: Diagnosis not present

## 2021-07-10 DIAGNOSIS — Z961 Presence of intraocular lens: Secondary | ICD-10-CM | POA: Diagnosis present

## 2021-07-10 DIAGNOSIS — C7931 Secondary malignant neoplasm of brain: Secondary | ICD-10-CM | POA: Diagnosis not present

## 2021-07-10 DIAGNOSIS — Z923 Personal history of irradiation: Secondary | ICD-10-CM | POA: Diagnosis not present

## 2021-07-10 DIAGNOSIS — R262 Difficulty in walking, not elsewhere classified: Secondary | ICD-10-CM | POA: Diagnosis not present

## 2021-07-10 DIAGNOSIS — Z7401 Bed confinement status: Secondary | ICD-10-CM | POA: Diagnosis not present

## 2021-07-10 DIAGNOSIS — G4700 Insomnia, unspecified: Secondary | ICD-10-CM | POA: Diagnosis present

## 2021-07-10 DIAGNOSIS — R32 Unspecified urinary incontinence: Secondary | ICD-10-CM | POA: Diagnosis present

## 2021-07-10 DIAGNOSIS — Z7901 Long term (current) use of anticoagulants: Secondary | ICD-10-CM | POA: Diagnosis not present

## 2021-07-10 DIAGNOSIS — Y92009 Unspecified place in unspecified non-institutional (private) residence as the place of occurrence of the external cause: Secondary | ICD-10-CM | POA: Diagnosis not present

## 2021-07-10 DIAGNOSIS — I959 Hypotension, unspecified: Secondary | ICD-10-CM | POA: Diagnosis not present

## 2021-07-10 LAB — GLUCOSE, CAPILLARY
Glucose-Capillary: 154 mg/dL — ABNORMAL HIGH (ref 70–99)
Glucose-Capillary: 384 mg/dL — ABNORMAL HIGH (ref 70–99)
Glucose-Capillary: 195 mg/dL — ABNORMAL HIGH (ref 70–99)
Glucose-Capillary: 207 mg/dL — ABNORMAL HIGH (ref 70–99)

## 2021-07-10 NOTE — Progress Notes (Signed)
Inpatient Diabetes Program Recommendations ? ?AACE/ADA: New Consensus Statement on Inpatient Glycemic Control (2015) ? ?Target Ranges:  Prepandial:   less than 140 mg/dL ?     Peak postprandial:   less than 180 mg/dL (1-2 hours) ?     Critically ill patients:  140 - 180 mg/dL  ? ?Lab Results  ?Component Value Date  ? GLUCAP 154 (H) 07/10/2021  ? HGBA1C 6.5 05/15/2021  ? ? ?Review of Glycemic Control ? Latest Reference Range & Units 07/09/21 08:36 07/09/21 11:24 07/09/21 17:56 07/09/21 20:10 07/10/21 08:21  ?Glucose-Capillary 70 - 99 mg/dL 173 (H) 305 (H) 201 (H) 293 (H) 154 (H)  ?(H): Data is abnormally high ? ?Diabetes history: DM2 ?Outpatient Diabetes medications: Lantus 10 units q hs ?Current orders for Inpatient glycemic control: Semglee 10 units qd, Novolog 0-6 units tid correction, Decadron 4 mg q 12 hrs. ?  ?Inpatient Diabetes Program Recommendations:   ?  ?Noted postprandial CBGs elevated. Please consider while on steroids: ?-Novolog 2 units tid meal coverage if eats 50% meal ? ?Will continue to follow while inpatient. ? ?Thank you, ?Reche Dixon, MSN, RN ?Diabetes Coordinator ?Inpatient Diabetes Program ?[phone] (team pager from 8a-5p) ? ? ? ? ? ?

## 2021-07-10 NOTE — TOC Progression Note (Addendum)
Transition of Care (TOC) - Progression Note  ? ? ?Patient Details  ?Name: Katelyn Kennedy ?MRN: [id number] ?Date of Birth: Nov 10, 1945 ? ?Transition of Care (TOC) CM/SW Contact  ?Katelyn Kennedy, LCSWA ?Phone Number: ?07/10/2021, 11:08 AM ? ?Clinical Narrative:    ? ?CSW was notified by [employer] that pt's ambulance authorization has been approved. Pt's SNF authorization has gone to peer to peer. MD advised to contact Katelyn Kennedy [phone]. It needs to be completed by tomorrow, 3/31, by 10 am. CSW called pt's daughter to update her on insurance. CSW attempted to work with daughter to determine a safe discharge plan. Dtr states she believes pt needs to be in a SNF or nursing home, but She is not eligible for Medicaid. She noted family is working on possible ALF placement, but pt may not be at a level where that would be an appropriate placement. CSW was advised to contact Katelyn Kennedy, pt's SIL. CSW was informed that both SIL's and Niece have noted they are no longer taking care of pt. VM was left for Katelyn Kennedy. TOC will continue to follow. ? ?11:45 ?CSW spoke with Katelyn Kennedy, pt's SIL. Katelyn Kennedy notes that pt was independent, but they noticed around Thanksgiving that she was needing a lot of help. The pt refused to hire caregivers, and the family rotated to try to provide that care. Katelyn Kennedy noted that pt has since gotten worse and they are unable to continue caring for as they are older too. Options were discussed and family agrees that pt needs nursing home placement at this time. Pt has been denied for Medicaid before, and the family is unsure about her finances as she never allowed them to have access. She is currently disoriented, and CSW advised considering guardianship and consulting an Elder care lawyer. CSW followed up with Katelyn Kennedy that to private pay for nursing home placement could be upward of 8-10,000 a month. They noted if pt can get approved for a short term SNF stay, they could work on getting her transferred  to their LTC facility. CSW was notified that insurance would likely deny SNF since she is custodial. CSW consulted [facility] to discuss. CSW to work with the family and interdisciplinary team to determine a solution.  ? ?1:30 ?CSW spoke with Katelyn Kennedy again. Katelyn Kennedy states she would prefer someone else be the pt's guardian or primary contact as she is disabled and [place] out of state. She says that she thought it was the oldest sibling, which would be her sister, but Katelyn Kennedy is not always reliable. Katelyn Kennedy spoke a lot about family dynamics and how some of the family involved in the care doesn't like other family members. CSW noted that the family needs to come together to figure out who is going to be responsible for decision making and who will do what is necessary to get the pt the care she needs. Katelyn Kennedy noted understanding and will speak with her family. TOC will continue to follow for DC needs. ? ?Expected Discharge Plan: [place] ?Barriers to Discharge: Continued Medical Work up, Ship broker, Family Issues ? ?Expected Discharge Plan and Services ?Expected Discharge Plan: [place] ?  ?  ?  ?Living arrangements for the past 2 months: [place] ?Expected Discharge Date: 07/09/21               ?  ?  ?  ?  ?  ?  ?  ?  ?  ?  ? ? ?  Social Determinants of Health (SDOH) Interventions ?  ? ?Readmission Risk Interventions ? ?  03/19/2021  ?  2:43 PM 11/08/2020  ?  3:25 PM  ?Readmission Risk Prevention Plan  ?Post Dischage Appt  Complete  ?Medication Screening  Complete  ?Transportation Screening Complete Complete  ?PCP or Specialist Appt within 3-5 Days Complete   ?[place] or Home Care Consult Complete   ?Social Work Consult for [place] Planning/Counseling Complete   ?Palliative Care Screening Not Applicable   ?Medication Review Press photographer) Complete   ? ? ?

## 2021-07-10 NOTE — Plan of Care (Signed)

## 2021-07-11 DIAGNOSIS — R531 Weakness: Secondary | ICD-10-CM | POA: Diagnosis not present

## 2021-07-11 LAB — GLUCOSE, CAPILLARY
Glucose-Capillary: 164 mg/dL — ABNORMAL HIGH (ref 70–99)
Glucose-Capillary: 144 mg/dL — ABNORMAL HIGH (ref 70–99)
Glucose-Capillary: 254 mg/dL — ABNORMAL HIGH (ref 70–99)
Glucose-Capillary: 273 mg/dL — ABNORMAL HIGH (ref 70–99)

## 2021-07-11 NOTE — TOC Progression Note (Signed)
Transition of Care (TOC) - Progression Note  ? ? ?Patient Details  ?Name: Katelyn Kennedy ?MRN: [id number] ?Date of Birth: 1946-01-20 ? ?Transition of Care (TOC) CM/SW Contact  ?Coralee Pesa, LCSWA ?Phone Number: ?07/11/2021, 2:22 PM ? ?Clinical Narrative:    ?CSW was notified by insurance that a peer to peer still needed to be completed. CSW updated MD. No update has been provided on insurance approval or denial. CSW spoke with [place], [facility], and [facility]. [place] and [facility] will review for LTC placement options if pt's medicaid is pending. She is being screened for LTC Medicaid by [facility]. [facility] noted there is a 1000 dollar up front cost for [facility] pending placement. [facility] is following in case pt gets approved for short term rehab. CSW supervisor working with [facility] to determine a DC plan.  ? ?[employer] has extended a denial at this time. TOC supervisor notified. CSW attempted to update, dtr, [name] and [employer] appeal information from [place], VM was left. TOC will continue to follow for DC needs. ? ? ?Expected Discharge Plan: [place] ?Barriers to Discharge: Continued Medical Work up, Ship broker, Family Issues ? ?Expected Discharge Plan and Services ?Expected Discharge Plan: [place] ?  ?  ?  ?Living arrangements for the past 2 months: [place] ?Expected Discharge Date: 07/09/21               ?  ?  ?  ?  ?  ?  ?  ?  ?  ?  ? ? ?Social Determinants of Health (SDOH) Interventions ?  ? ?Readmission Risk Interventions ? ?  03/19/2021  ?  2:43 PM 11/08/2020  ?  3:25 PM  ?Readmission Risk Prevention Plan  ?Post Dischage Appt  Complete  ?Medication Screening  Complete  ?Transportation Screening Complete Complete  ?PCP or Specialist Appt within 3-5 Days Complete   ?[place] or Home Care Consult Complete   ?Social Work Consult for [place] Planning/Counseling Complete   ?Palliative Care Screening Not Applicable   ?Medication Review Press photographer)  Complete   ? ? ?

## 2021-07-11 NOTE — Progress Notes (Signed)
Physical Therapy Treatment ?Patient Details ?Name: Katelyn Kennedy ?MRN: [id number] ?DOB: [birth year] ?Today's Date: 07/11/2021 ? ? ?History of Present Illness Pt is a 76 y.o. female admitted 07/07/21 after fall out of bed with reports of worsening L-side weakness related to brain metastases with prior resection. Head CT showing continued presence of brain mets with vasogenic edema and leftward shift of the brain. 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 ?  ?PT Comments  ? ? Pt slowly progressing with mobility. Today's session focused on standing trials and LUE/LLE engagement, pt requiring mod-maxA+2 for mobility; pt continues to demonstrate L-side inattention and weakness. Pt remains limited by generalized weakness, decreased activity tolerance, poor balance strategies/postural reactions and impaired cognition. Continue to recommend SNF-level therapies to maximize functional mobility and independence prior to return home.  ?   ?Recommendations for follow up therapy are one component of a multi-disciplinary discharge planning process, led by the attending physician.  Recommendations may be updated based on patient status, additional functional criteria and insurance authorization. ? ?Follow Up Recommendations ? Skilled nursing-short term rehab (<3 hours/day) ?  ?  ?Assistance Recommended at Discharge Frequent or constant Supervision/Assistance  ?Patient can return home with the following A lot of help with bathing/dressing/bathroom;Assistance with cooking/housework;Direct supervision/assist for financial management;Direct supervision/assist for medications management;Assist for transportation;Help with stairs or ramp for entrance;Two people to help with walking and/or transfers ?  ?Equipment Recommendations ? Wheelchair (measurements PT);Wheelchair cushion (measurements PT);Hospital bed  ?  ?Recommendations for Other Services   ? ? ?   ?Precautions / Restrictions Precautions ?Precautions: Fall;Other (comment) ?Precaution Comments: h/o lung CA with brain mets (s/p resection R posterior frontal dural metastasis) with L-side weakness and inattention ?Restrictions ?Weight Bearing Restrictions: No  ?  ? ?Mobility ? Bed Mobility ?Overal bed mobility: Needs Assistance ?Bed Mobility: Supine to Sit ?  ?  ?Supine to sit: Max assist, HOB elevated ?  ?  ?General bed mobility comments: increased time, assist for LEs over EOB, hips to EOB with bed pad and to raise trunk ?  ? ?Transfers ?Overall transfer level: Needs assistance ?Equipment used: Rolling walker (2 wheels), 2 person hand held assist ?Transfers: Sit to/from Stand, Bed to chair/wheelchair/BSC ?Sit to Stand: +2 physical assistance, Mod assist ?Stand pivot transfers: +2 physical assistance, Max assist ?  ?  ?  ?  ?General transfer comment: stood from bed using RW with assist to rise and steady and place R hand on walker x 2, max assist to place L LE for wider BOS, pt with posterior and L bias, unable to take steps  with RW, transferred bed to chair with B HHA ?  ? ?Ambulation/Gait ?  ?  ?  ?  ?  ?  ?  ?  ? ? ?Stairs ?  ?  ?  ?  ?  ? ? ?Wheelchair Mobility ?  ? ?Modified Rankin (Stroke Patients Only) ?  ? ? ?  ?Balance Overall balance assessment: Needs assistance ?Sitting-balance support: Bilateral upper extremity supported, Feet supported ?Sitting balance-Leahy Scale: Poor ?Sitting balance - Comments: Reliant on at least mod to maintain sitting balance. ?Postural control: Left lateral lean ?Standing balance support: Bilateral upper extremity supported, During functional activity ?Standing balance-Leahy Scale: Zero ?Standing balance comment: requires B UE support and +2 assist ?  ?  ?  ?  ?  ?  ?  ?  ?  ?  ?  ?  ? ?  ?  Cognition Arousal/Alertness: Awake/alert ?Behavior During Therapy: Flat affect ?Overall Cognitive Status: Impaired/Different from baseline ?Area of Impairment: Following commands, Memory,  Attention, Orientation, Safety/judgement, Awareness, Problem solving ?  ?  ?  ?  ?  ?  ?  ?  ?Orientation Level: Disoriented to, Time ?Current Attention Level: Focused ?Memory: Decreased short-term memory ?Following Commands: Follows one step commands with increased time ?Safety/Judgement: Decreased awareness of safety, Decreased awareness of deficits ?Awareness: Intellectual ?Problem Solving: Slow processing, Requires verbal cues, Decreased initiation ?General Comments: pt continues to demonstrate impaired motor planning, requiring multimodal cues to follow commands during standing and dressing ?  ?  ? ?  ?Exercises Other Exercises ?Other Exercises: LUE across body reaching with trunk rotation ? ?  ?General Comments General comments (skin integrity, edema, etc.): sister present to observe most of session ?  ?  ? ?Pertinent Vitals/Pain Pain Assessment ?Pain Assessment: No/denies pain  ? ? ?Home Living   ?  ?  ?  ?  ?  ?  ?  ?  ?  ?   ?  ?Prior Function    ?  ?  ?   ? ?PT Goals (current goals can now be found in the care plan section) Progress towards PT goals: Progressing toward goals ? ?  ?Frequency ? ? ? Min 2X/week ? ? ? ?  ?PT Plan Frequency needs to be updated  ? ? ?Co-evaluation PT/OT/SLP Co-Evaluation/Treatment: Yes ?Reason for Co-Treatment: Complexity of the patient's impairments (multi-system involvement);For patient/therapist safety;To address functional/ADL transfers ?PT goals addressed during session: Mobility/safety with mobility;Balance ?OT goals addressed during session: ADL's and self-care ?  ? ?  ?AM-PAC PT "6 Clicks" Mobility   ?Outcome Measure ? Help needed turning from your back to your side while in a flat bed without using bedrails?: A Lot ?Help needed moving from lying on your back to sitting on the side of a flat bed without using bedrails?: A Lot ?Help needed moving to and from a bed to a chair (including a wheelchair)?: Total ?Help needed standing up from a chair using your arms (e.g.,  wheelchair or bedside chair)?: Total ?Help needed to walk in hospital room?: Total ?Help needed climbing 3-5 steps with a railing? : Total ?6 Click Score: 8 ? ?  ?End of Session Equipment Utilized During Treatment: Gait belt ?Activity Tolerance: Patient tolerated treatment well ?Patient left: in chair;with call bell/phone within reach;with chair alarm set;with family/visitor present ?Nurse Communication: Mobility status ?PT Visit Diagnosis: Unsteadiness on feet (R26.81);Muscle weakness (generalized) (M62.81);Repeated falls (R29.6);History of falling (Z91.81) ?  ? ? ?Time: 4917-9150 ?PT Time Calculation (min) (ACUTE ONLY): 25 min ? ?Charges:  $Therapeutic Activity: 8-22 mins          ?          ? ?Katelyn Kennedy, PT, DPT ?Acute Rehabilitation Services  ?Pager [phone] ?Office [phone] ? ?Katelyn Kennedy ?07/11/2021, 2:42 PM ? ?

## 2021-07-11 NOTE — Progress Notes (Signed)
Mobility Specialist Progress Note: ? ? 07/11/21 1452  ?Mobility  ?Activity Transferred from chair to bed  ?Level of Assistance Moderate assist, patient does 50-74%  ?Assistive Device  ?(HHA)  ?Distance Ambulated (ft) 2 ft  ?Activity Response Tolerated well  ?$Mobility charge 1 Mobility  ? ?Pt received in chair wanting to go back to bed. ModA+2 to pivot to bed. Left in bed with call bell in reach and all needs met.  ? ?Katelyn Kennedy ?Mobility Specialist ?Primary Phone [phone] ? ?

## 2021-07-11 NOTE — Progress Notes (Signed)
?Progress note ? ?HPI: Katelyn Kennedy is a 76 y.o. 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  Patient reports History is obtained from the patient as well as her sister-in-law over the phone.  3 family members have been rotating to try and provide 24-hour care since November 2022.  Two of those family members are over the age of 79 and have difficulty with picking the patient up, and she reports that on 3/31 the rotation is supposed to stop.  At baseline the patient needs assistance with all of her ADLs, but have been able to ambulate with the use of a walker some up until about 3 days ago.  Of note she had been told to taper off of her steroids and had taken her last dose yesterday.  Since the tapering of her steroids patient has become more weak and family noted that she was not able to use her left side much at all.  Unable to get up or move out of bed yesterday.  When she tried to get up this morning she slid out of bed and denies having sustaining any injuries.  Her sister-in-law was present, but unable to get her up for which she called EMS.  Patient reports that she had been eating and drinking okay.  She has a chronic intermittent mostly nonproductive cough that is unchanged.  Denies having any fever, chills, nausea, vomiting, or diarrhea.  Although her sister-in-law states that she has had some incontinence.Patient had been hospitalized in January this year where she was found to have the pulmonary embolus and subsequently in February of this year for worsening left-sided weakness. ? ?Upon admission into the emergency department patient was noted to be afebrile with heart rates to 110 and all other vital signs maintained.  Labs  significant for WBC 10.6 and hemoglobin 11.1.  Chest x-ray noted ill-defined opacity of the perihilar right lung and right lung base increased in prominence concerning for superimposed airspace disease or recurrent tumor cannot be excluded.  CT scan of the head noted extensive dural based hemorrhagic metastatic disease without significant change in extensive vasogenic edema or midline shift small volume extra axonal hemorrhage along the right frontal convexity with superimposed traumatic hemorrhage difficult to fully exclude. ? ? ?Physical Exam: ?Vitals:  ? 07/10/21 1756 07/10/21 2056 07/11/21 2947 07/11/21 0750  ?BP: (!) 113/57 (!) 101/57 114/70 118/63  ?Pulse: 98 100 83 87  ?Resp: 18 18 18 18   ?Temp: 98.2 ?F (36.8 ?C) 98.2 ?F (36.8 ?C) 98.2 ?F (36.8 ?C) 97.7 ?F (36.5 ?C)  ?TempSrc: Oral Oral Oral Oral  ?SpO2: 97% 98% 98% 97%  ?Weight:      ?Height:      ? ? ? ?Constitutional: Chronically ill appearing elderly female in no acute ?Eyes: PERRL, lids and conjunctivae normal ?ENMT: Mucous membranes are moist. Posterior pharynx clear of any exudate or lesions.  ?Neck: normal, supple, no masses, no thyromegaly ?Respiratory:   ?Cardiovascular: Regular rate and rhythm, no murmurs / rubs / gallops. . 2+ pedal pulses. No carotid bruits.  ?Abdomen: no tenderness, no masses palpated. No hepatosplenomegaly. Bowel sounds positive.  ?Musculoskeletal: no clubbing / cyanosis. No joint deformity upper and lower extremities. Good ROM, no contractures. Normal muscle tone.  ?Skin:  no rashes, lesions, ulcers. No induration ?Neurologic: CN 2-12 grossly intact.  Left upper and lower extremity weakness appreciated. ?Psychiatric: Normal judgment and insight. Alert and oriented x 3. Normal mood.  ? ?Data Reviewed: ? ?Labs and imaging studies reviewed continue ? ?Assessment and Plan: ?* Fall at home secondary to weakness ?Patient presents after having a fall at home with reports of worsening left-sided weakness related with brain metastases  with prior resection.  She had just recently been tapered off of Decadron.  CT imaging showing continued presence of brain mets with vasogenic edema and leftward shift of the brain. ?Case was discussed with Dr. Mickeal Skinner who recommended resuming Decadron 4 mg twice daily.  Patient has been given Decadron 10 mg IV x1 dose. ?-Admit to a telemetry bed ?-Decadron 4 mg twice daily ?-PT to eval and treat ?-Need short-term rehab placement ? 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 ?-Continue Eliquis  ? ?Leukocytosis ?Acute.  WBC elevated at 10.6, but appears to be trending down from last check on 3/1 at 13.7.  Possibly related with patient being on steroids. ?-No clear source of infection at this time ?-Continue to monitor ? ?Normocytic anemia ?Chronic.  Hemoglobin 11.1 g/dL which appears near patient's baseline ?-Continue to monitor ? ?Non-small cell lung cancer metastatic to brain [facility]) with vasogenic brain edema ?Patient had initially been diagnosed with suspicious metastatic pulmonary nodules and February 2019 with evidence of metastatic disease with solitary brain metastasis in July 2022.  Patient has been treated with chemoradiation and resection of the right posterior frontal dural metastasis. ?-Continue Keppra ?-Decadron as recommended by Dr. Mickeal Skinner ?-Appreciate palliative care consultative services, we will follow-up for any further recommendations ? ?Diet-controlled type 2 diabetes mellitus ([place]) ?Patient had been relatively well controlled.  Last hemoglobin A1c 6.5 on 06/04/2021.  Home regimen included Lantus 10 units nightly ?-Hypoglycemic protocols ?-Continue Lantus 10 units nightly ?-CBGs before every meal with very sensitive SSI ? ?Insurance company has denied rehabilitation referral.  I have done a peer to peer review today.   Final decision pending.  If formally denied will be disposition issue is patient unsafe to be discharged due to her mental status changes to home alone.  Case management working along with disposition plan.  Palliative care also following. ? ? Advance Care Planning:   Code Status: DNR   ? ?Author: ?Phillips Grout, MD ?07/11/2021 3:16 PM ? ?

## 2021-07-11 NOTE — Progress Notes (Signed)
Occupational Therapy Treatment ?Patient Details ?Name: Katelyn Kennedy ?MRN: [id number] ?DOB: 1945-08-18 ?Today's Date: 07/11/2021 ? ? ?History of present illness Pt is a 76 y.o. female admitted 07/07/21 after fall out of bed with reports of worsening L-side weakness related to brain metastases with prior resection. Head CT showing continued presence of brain mets with vasogenic edema and leftward shift of the brain. 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 ?  ?OT comments ? Pt continues to have difficulty initiating movement and motor planning. Requiring max assist for bed mobility, +2 mod assist to stand and maintain static standing for pericare. Pt unable to take steps with RW, minimally bearing weight through L LE. Transferred with B HHA with +2 max assist. Pt needing max assist to change soiled gown, attention interfering. L side lean in recliner, positioned at midline with pillows. Sister in room and observing most of session. Continues to be appropriate for SNF.  ? ?Recommendations for follow up therapy are one component of a multi-disciplinary discharge planning process, led by the attending physician.  Recommendations may be updated based on patient status, additional functional criteria and insurance authorization. ?   ?Follow Up Recommendations ? Skilled nursing-short term rehab (<3 hours/day)  ?  ?Assistance Recommended at Discharge Frequent or constant Supervision/Assistance  ?Patient can return home with the following ? Two people to help with walking and/or transfers;A lot of help with bathing/dressing/bathroom;Assistance with cooking/housework;Assistance with feeding;Direct supervision/assist for medications management;Direct supervision/assist for financial management;Assist for transportation;Help with stairs or ramp for entrance ?  ?Equipment Recommendations ? Other (comment) (defer to next venue)  ?   ?Recommendations for Other Services   ? ?  ?Precautions / Restrictions Precautions ?Precautions: Fall;Other (comment) ?Precaution Comments: h/o lung CA with brain mets (s/p resection R posterior frontal dural metastasis) with L-side weakness and inattention ?Restrictions ?Weight Bearing Restrictions: No  ? ? ?  ? ?Mobility Bed Mobility ?Overal bed mobility: Needs Assistance ?Bed Mobility: Supine to Sit ?  ?  ?Supine to sit: Max assist, HOB elevated ?  ?  ?General bed mobility comments: increased time, assist for LEs over EOB, hips to EOB with bed pad and to raise trunk ?  ? ?Transfers ?Overall transfer level: Needs assistance ?Equipment used: Rolling walker (2 wheels), 2 person hand held assist ?Transfers: Sit to/from Stand, Bed to chair/wheelchair/BSC ?Sit to Stand: +2 physical assistance, Mod assist ?Stand pivot transfers: +2 physical assistance, Max assist ?  ?  ?  ?  ?General transfer comment: stood from bed using RW with assist to rise and steady and place R hand on walker x 2, max assist to place L LE for wider BOS, pt with posterior and L bias, unable to take steps  with RW, transferred bed to chair with B HHA ?  ?  ?Balance Overall balance assessment: Needs assistance ?  ?Sitting balance-Leahy Scale: Poor ?  ?Postural control: Left lateral lean ?Standing balance support: Bilateral upper extremity supported, During functional activity ?Standing balance-Leahy Scale: Zero ?Standing balance comment: requires B UE support and +2 assist ?  ?  ?  ?  ?  ?  ?  ?  ?  ?  ?  ?   ? ?ADL either performed or assessed with clinical judgement  ? ?ADL Overall ADL's : Needs assistance/impaired ?Eating/Feeding: Set up;Sitting ?Eating/Feeding Details (indicate cue type and reason): pt distracted by items on food tray, looking for syrup without food item that warranted  syrup ?  ?  ?  ?  ?  ?  ?Upper Body Dressing : Maximal assistance;Sitting ?Upper Body Dressing Details (indicate cue type and reason): pt needing multimodal cues  to sequence and attend to changing gown ?  ?  ?  ?  ?Toileting- Clothing Manipulation and Hygiene: Total assistance;+2 for physical assistance;Sit to/from stand ?  ?  ?  ?Functional mobility during ADLs: +2 for physical assistance;Moderate assistance ?  ?  ? ?Extremity/Trunk Assessment   ?  ?  ?  ?  ?  ? ?Vision   ?  ?  ?Perception   ?  ?Praxis   ?  ? ?Cognition Arousal/Alertness: Awake/alert ?Behavior During Therapy: Flat affect ?Overall Cognitive Status: Impaired/Different from baseline ?Area of Impairment: Following commands, Memory, Attention, Orientation, Safety/judgement, Awareness, Problem solving ?  ?  ?  ?  ?  ?  ?  ?  ?Orientation Level: Disoriented to, Time ?Current Attention Level: Focused ?Memory: Decreased short-term memory ?Following Commands: Follows one step commands with increased time ?Safety/Judgement: Decreased awareness of safety, Decreased awareness of deficits ?Awareness: Intellectual ?Problem Solving: Slow processing, Requires verbal cues, Decreased initiation ?General Comments: pt continues to demonstrate impaired motor planning, requiring multimodal cues to follow commands during standing and dressing ?  ?  ?   ?Exercises   ? ?  ?Shoulder Instructions   ? ? ?  ?General Comments    ? ? ?Pertinent Vitals/ Pain       Pain Assessment ?Pain Assessment: No/denies pain ? ?Home Living   ?  ?  ?  ?  ?  ?  ?  ?  ?  ?  ?  ?  ?  ?  ?  ?  ?  ?  ? ?  ?Prior Functioning/Environment    ?  ?  ?  ?   ? ?Frequency ? Min 2X/week  ? ? ? ? ?  ?Progress Toward Goals ? ?OT Goals(current goals can now be found in the care plan section) ? Progress towards OT goals: Not progressing toward goals - comment ? ?Acute Rehab OT Goals ?OT Goal Formulation: With patient ?Time For Goal Achievement: 07/23/21 ?Potential to Achieve Goals: Fair  ?Plan Discharge plan remains appropriate   ? ?Co-evaluation ? ? ? PT/OT/SLP Co-Evaluation/Treatment: Yes ?Reason for Co-Treatment: For patient/therapist safety ?  ?OT goals addressed  during session: ADL's and self-care ?  ? ?  ?AM-PAC OT "6 Clicks" Daily Activity     ?Outcome Measure ? ? Help from another person eating meals?: A Little ?Help from another person taking care of personal grooming?: A Little ?Help from another person toileting, which includes using toliet, bedpan, or urinal?: Total ?Help from another person bathing (including washing, rinsing, drying)?: A Lot ?Help from another person to put on and taking off regular upper body clothing?: A Lot ?Help from another person to put on and taking off regular lower body clothing?: Total ?6 Click Score: 12 ? ?  ?End of Session Equipment Utilized During Treatment: Gait belt;Rolling walker (2 wheels) ? ?OT Visit Diagnosis: Other abnormalities of gait and mobility (R26.89);Unsteadiness on feet (R26.81);Muscle weakness (generalized) (M62.81);Hemiplegia and hemiparesis;Other symptoms and signs involving cognitive function ?Hemiplegia - Right/Left: Left ?Hemiplegia - dominant/non-dominant: Non-Dominant ?Hemiplegia - caused by: Unspecified ?  ?Activity Tolerance Patient tolerated treatment well ?  ?Patient Left in chair;with call bell/phone within reach;with chair alarm set;with family/visitor present ?  ?Nurse Communication Mobility status ?  ? ?   ? ?Time: 2094-7096 ?OT Time Calculation (  min): 30 min ? ?Charges: OT General Charges ?$OT Visit: 1 Visit ?OT Treatments ?$Self Care/Home Management : 8-22 mins ? ?Nestor Lewandowsky, OTR/L ?Acute Rehabilitation Services ?Pager: [phone] ?Office: [phone]  ? ?[name] ?07/11/2021, 12:29 PM ?

## 2021-07-12 DIAGNOSIS — Z515 Encounter for palliative care: Secondary | ICD-10-CM

## 2021-07-12 DIAGNOSIS — Z66 Do not resuscitate: Secondary | ICD-10-CM

## 2021-07-12 DIAGNOSIS — Z86711 Personal history of pulmonary embolism: Secondary | ICD-10-CM | POA: Diagnosis not present

## 2021-07-12 DIAGNOSIS — Z7189 Other specified counseling: Secondary | ICD-10-CM

## 2021-07-12 DIAGNOSIS — R531 Weakness: Secondary | ICD-10-CM | POA: Diagnosis not present

## 2021-07-12 DIAGNOSIS — G936 Cerebral edema: Secondary | ICD-10-CM | POA: Diagnosis not present

## 2021-07-12 DIAGNOSIS — C3490 Malignant neoplasm of unspecified part of unspecified bronchus or lung: Secondary | ICD-10-CM | POA: Diagnosis not present

## 2021-07-12 LAB — GLUCOSE, CAPILLARY
Glucose-Capillary: 152 mg/dL — ABNORMAL HIGH (ref 70–99)
Glucose-Capillary: 316 mg/dL — ABNORMAL HIGH (ref 70–99)
Glucose-Capillary: 232 mg/dL — ABNORMAL HIGH (ref 70–99)
Glucose-Capillary: 208 mg/dL — ABNORMAL HIGH (ref 70–99)

## 2021-07-12 NOTE — Progress Notes (Signed)
?                                                                                                                                                     ?                                                   ?Palliative Medicine Progress Note  ? ?Patient Name: Katelyn Kennedy       Date: 07/12/2021 ?DOB: April 11, 1946  Age: 76 y.o. MRN#: [id number] ?Attending Physician: Oren Binet* ?Primary Care Physician: [name], MD ?Admit Date: 07/07/2021 ? ? ?HPI/Patient Profile: 76 y.o. 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 She had recently been tapered off Decadron.  ?In the ED, chest x-ray showed ill-defined opacity of the perihilar right lung and right lung base increased in prominence concerning for superimposed airspace disease or recurrent tumor. CT of the head showed extensive dural based hemorrhagic metastatic disease without significant change in extensive vasogenic edema or midline shift. Ad[facility] to [facility]. ?  ?Regarding oncology history, she was originally diagnosed as stage IIIa in February 2019. She is status post concurrent chemo and radiation (last dose 07/2017) and status post immunotherapy (last dose May 2020). She is also status craniotomy July 2022.   ? ?Subjective: ?Chart reviewed. Note that rehab has been denied and discharge home remains unsafe due to her mental status changes.  ? ?I went to see patient at bedside. She is alert and pleasant, with no acute complaints. She is oriented, but intermittently her speech remains tangential and consistent with limited insight. Continues to be unable to have a full and meaningful goals of care discussion.  ? ?I spoke with her daughter Katelyn Kennedy by phone. I introduced myself and the role of palliative care. Katelyn Kennedy tells me she lives  in [place] but remains in close contact with her mother. She tells me there are challenging dynamics among the family because she lives out of state and was not able to care for her mother. However, Katelyn Kennedy continues keeps in close contact with her aunt [name] (patient's sister-in-law). ? ?I updated Kim on her mother's current medical condition.  I provided education on the natural disease trajectory of metastatic cancer, emphasizing that it is a non-curable illness. Discussed that functional status is generally preserved until late in the disease course, followed by a precipitous decline over weeks to months. Kim verbalizes understanding and agrees that her mother is declining. Kim verbalizes understanding  that her mother's overall prognosis is poor. We discussed the difference between full scope medical treatment and comfort care. Katelyn Kennedy indicates she would be supportive of a comfort path when her mother further declines.  ? ?I expressed concern that patient seems to have limited ability to make complex medical decisions. Kim agrees with this based on recent phone conversations with her mother. Discussed that Terrence does not have a documented HCPOA, therefore decision making would fall to patient's 2 daughters as next of kin.  ?Katelyn Kennedy states that she is willing to help make decisions for her mother if/when needed.   ? ? ?Objective: ? ?Physical Exam ?Vitals reviewed.  ?Constitutional:   ?   General: She is not in acute distress. ?   Appearance: She is ill-appearing.  ?Neurological:  ?   Mental Status: She is alert.  ?   Motor: Weakness present.  ?Psychiatric:     ?   Cognition and Memory: Cognition is impaired. Memory is impaired.  ?         ? ?Vital Signs: BP 122/60 (BP Location: Left Arm)   Pulse 88   Temp 98.4 ?F (36.9 ?C) (Oral)   Resp 18   Ht 4\' 10"  (1.473 m)   Wt 56.4 kg   SpO2 97%   BMI 25.99 kg/m?  ?SpO2: SpO2: 97 % ?O2 Device: O2 Device: Room Air ?O2 Flow Rate:   ? ?LBM: Last BM Date : 07/10/21 ? ?    ?Palliative Assessment/Data: PPS 30% ? ? ? ? ?Palliative Medicine Assessment & Plan  ? ?Assessment: ?Principal Problem: ?  Fall at home secondary to weakness ?Active Problems: ?  Diet-controlled type 2 diabetes mellitus ([place]) ?  Vasogenic brain edema ([place]) ?  Chronic anticoagulation ?  Non-small cell lung cancer metastatic to brain [facility]) with vasogenic brain edema ?  Normocytic anemia ?  Leukocytosis ?  History of DVT (deep vein thrombosis) ?  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  ? ?Recommendations/Plan: ?DNR/DNI as previously documented ?Continue current interventions ?By default, patient's 2 daughters would be her medical decision makers if/when needed ?Daughter/Kim should be first contact for any updates or decisions ? ?PMT will continue to follow peripherally. Please call [phone] if there are urgent needs   ? ? ?Prognosis: ? < 6 months ? ?Discharge Planning: ?To Be Determined ? ? ? ?Thank you for allowing the Palliative Medicine Team to assist in the care of this patient. ? ? ?MDM - [place] ? ? ?Lavena Bullion, NP ? ? ?Please contact Palliative Medicine Team phone at [phone] for questions and concerns.  ?For individual providers, please see AMION. ? ? ? ? ? ?

## 2021-07-12 NOTE — Progress Notes (Signed)
? ?PROGRESS NOTE ? ? ? ?Katelyn Kennedy  ?VHQ:[id number]  ?DOB: 09-26-1945  ?DOA: 07/07/2021 ?PCP: [name], MD ?Outpatient Specialists: ? ? ?Hospital course: ? 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  Work-up revealed that fall was likely secondary to left-sided weakness from her brain mets.  She had recently been tapered off Decadron.  Patient was restarted on Decadron.  Work-up including CT of head noted extensive dural based hemorrhagic metastatic disease. ? ? ?Subjective: ? ?Patient states she is doing well, has no complaints, is anticipating discharge when a safe discharge location is identified. ? ? ?Objective: ?Vitals:  ? 07/11/21 1934 07/12/21 0604 07/12/21 0919 07/12/21 1652  ?BP: (!) 105/51 118/62 128/67 122/60  ?Pulse: (!) 108 79 82 88  ?Resp: 18 18 19 18   ?Temp: 97.7 ?F (36.5 ?C) 97.7 ?F (36.5 ?C) 98.5 ?F (36.9 ?C) 98.4 ?F (36.9 ?C)  ?TempSrc: Oral Oral Oral Oral  ?SpO2: 97% 98% 98% 97%  ?Weight:      ?Height:      ? ? ?Intake/Output Summary (Last 24 hours) at 07/12/2021 1724 ?Last data filed at 07/12/2021 1134 ?Gross per 24 hour  ?Intake 120 ml  ?Output 1200 ml  ?Net -1080 ml  ? ?Filed Weights  ? 07/07/21 0238 07/08/21 0326  ?Weight: (P) 57.6 kg 56.4 kg  ? ? ? ?Exam: ? ?General: Chronically ill-appearing patient with no hair sitting up in bed eating breakfast. ?Eyes: sclera anicteric, conjuctiva mild injection bilaterally ?CVS: S1-S2, regular  ?Respiratory:  decreased air entry bilaterally secondary to decreased inspiratory effort, rales at bases  ?GI: NABS, soft, NT  ?LE: No edema.  ?Psych: patient is logical and coherent, mood and affect appropriate to situation. ? ? ?Assessment & Plan: ?  ?Weakness and fall in patient with metastatic non-small cell lung CA and multiple brain mets ?Patient doing better with initiation of Decadron ?Continue Decadron ?Continue  Keppra ? ?VTE/PE ?Continue Eliquis for PE with right heart strain on 05/08/2021 ? ?Metastatic non-small cell lung CA ?Patient to follow-up with her outpatient oncologist ?Palliative care services have been involved ? ?DM 2 ?BS under reasonable control on present regimen ? ?Disposition ?Insurance company denied rehab referral ?Shanon Brow did a peer to peer with them on 07/11/21 ?Appreciate ongoing TOC involvement for finding safe disposition for patient ? ?DVT prophylaxis: Eliquis ?Code Status: DNR ?Family Communication: None today ?Disposition Plan:  ? Patient is from: Home ? Anticipated Discharge Location: TBD ? Barriers to Discharge: Awaiting disposition for safe discharge ? Is patient medically stable for Discharge: Yes ? ? ?Scheduled Meds: ? amitriptyline  50 mg Oral QHS  ? apixaban  5 mg Oral BID  ? atorvastatin  40 mg Oral QPM  ? dexamethasone  4 mg Oral Q12H  ? famotidine  20 mg Oral BID  ? insulin aspart  0-6 Units Subcutaneous TID WC  ? insulin glargine-yfgn  10 Units Subcutaneous QHS  ? levETIRAcetam  500 mg Oral BID  ? sodium chloride flush  3 mL Intravenous Q12H  ? vitamin B-12  1,000 mcg Oral Daily  ? ?Continuous Infusions: ? ?Data Reviewed: ? ?Basic Metabolic Panel: ?Recent Labs  ?Lab 07/07/21 ?1326 07/08/21 ?0446  ?NA 139 140  ?K 3.5 4.4  ?CL 107 108  ?CO2 25 22  ?GLUCOSE 87 192*  ?BUN 14 16  ?CREATININE 0.83 0.69  ?CALCIUM 9.1 10.2  ? ? ?CBC: ?  Recent Labs  ?Lab 07/07/21 ?1326 07/08/21 ?0446  ?WBC 10.6* 7.3  ?HGB 11.3* 12.4  ?HCT 35.2* 38.9  ?MCV 91.7 91.3  ?PLT 249 306  ? ? ?Studies: ?No results found. ? ?Principal Problem: ?  Fall at home secondary to weakness ?Active Problems: ?  Diet-controlled type 2 diabetes mellitus ([place]) ?  Vasogenic brain edema ([place]) ?  Chronic anticoagulation ?  Non-small cell lung cancer metastatic to brain [facility]) with vasogenic brain edema ?  Normocytic anemia ?  Leukocytosis ?  History of DVT (deep vein thrombosis) ?  History of pulmonary embolism and DVT on chronic  anticoagulation ? ? ? ? ?Dewaine Oats Derek Jack, ?[facility] ? ?If 7PM-7AM, please contact night-coverage ?[email or web address] ? ? LOS: 2 days  ? ?

## 2021-07-13 DIAGNOSIS — R531 Weakness: Secondary | ICD-10-CM | POA: Diagnosis not present

## 2021-07-13 LAB — GLUCOSE, CAPILLARY
Glucose-Capillary: 144 mg/dL — ABNORMAL HIGH (ref 70–99)
Glucose-Capillary: 350 mg/dL — ABNORMAL HIGH (ref 70–99)
Glucose-Capillary: 326 mg/dL — ABNORMAL HIGH (ref 70–99)
Glucose-Capillary: 207 mg/dL — ABNORMAL HIGH (ref 70–99)

## 2021-07-13 MED ORDER — INSULIN GLARGINE-YFGN 100 UNIT/ML ~~LOC~~ SOLN
15.0000 [IU] | Freq: Every day | SUBCUTANEOUS | Status: DC
  Administered 2021-07-13 – 2021-07-16 (×4): 15 [IU] via SUBCUTANEOUS
  Filled 2021-07-13 (×5): qty 0.15

## 2021-07-13 NOTE — TOC Progression Note (Signed)
Transition of Care (TOC) - Progression Note  ? ? ?Patient Details  ?Name: Katelyn Kennedy ?MRN: [id number] ?Date of Birth: Oct 03, 1945 ? ?Transition of Care (TOC) CM/SW Contact  ?[place], [place], [place] ?Phone Number: ?07/13/2021, 11:15 AM ? ?Clinical Narrative:    ?Phone call to patient's daughter [name], provided information for appeal. Daughter [name], lives out of state-[place]. Contact number provided to file appeal for rehab stay [phone]. Patient's sister in law applying for long term care Medicaid for patient for Assisted Living , however may have applied for the incorrect type of Medicaid. Confirmed that sister in law will need to apply for Special Assistance Medicaid for long term care ? ?Patient's daughter agreeable to contacting [employer] to start appeals process for rehab stay. ? ?[employer], LCSW ?Transition of Care ?[phone] ? ? ?Expected Discharge Plan: [place] ?Barriers to Discharge: Continued Medical Work up, Ship broker, Family Issues ? ?Expected Discharge Plan and Services ?Expected Discharge Plan: [place] ?  ?  ?  ?Living arrangements for the past 2 months: [place] ?Expected Discharge Date: 07/09/21               ?  ?  ?  ?  ?  ?  ?  ?  ?  ?  ? ? ?Social Determinants of Health (SDOH) Interventions ?  ? ?Readmission Risk Interventions ? ?  03/19/2021  ?  2:43 PM 11/08/2020  ?  3:25 PM  ?Readmission Risk Prevention Plan  ?Post Dischage Appt  Complete  ?Medication Screening  Complete  ?Transportation Screening Complete Complete  ?PCP or Specialist Appt within 3-5 Days Complete   ?[place] or Home Care Consult Complete   ?Social Work Consult for [place] Planning/Counseling Complete   ?Palliative Care Screening Not Applicable   ?Medication Review Press photographer) Complete   ? ? ?

## 2021-07-13 NOTE — Progress Notes (Addendum)
? ?PROGRESS NOTE ? ? ? ?Katelyn Kennedy  ?[id number]  ?DOB: June 01, 1945  ?DOA: 07/07/2021 ?PCP: [name], MD ?Outpatient Specialists: ? ? ?Hospital course: ? 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  Work-up revealed that fall was likely secondary to left-sided weakness from her brain mets.  She had recently been tapered off Decadron.  Patient was restarted on Decadron.  Work-up including CT of head noted extensive dural based hemorrhagic metastatic disease. ? ? ?Subjective: ? ?Patient's main concern is that her pure wick was replaced.  Patient states she has significant skin irritation with the pure wick and she really does not want to use it.  We discussed that we would not want her to develop a sore given ongoing wetness from urinary incontinence.  She states she understands however she really does not want to have a pure wick. ? ? ?Objective: ?Vitals:  ? 07/12/21 2052 07/13/21 0416 07/13/21 0837 07/13/21 1548  ?BP: 117/65 128/68 124/62 111/71  ?Pulse: (!) 101 86 83 (!) 103  ?Resp: 15 17 18 18   ?Temp: 98.1 ?F (36.7 ?C) 97.8 ?F (36.6 ?C) 97.8 ?F (36.6 ?C) 97.6 ?F (36.4 ?C)  ?TempSrc: Oral Oral Oral Oral  ?SpO2: 97% 98% 97% 99%  ?Weight:      ?Height:      ? ? ?Intake/Output Summary (Last 24 hours) at 07/13/2021 1636 ?Last data filed at 07/13/2021 1553 ?Gross per 24 hour  ?Intake 680 ml  ?Output 1800 ml  ?Net -1120 ml  ? ? ?Filed Weights  ? 07/07/21 0238 07/08/21 0326  ?Weight: (P) 57.6 kg 56.4 kg  ? ? ? ?Exam: ? ?General: Chronically ill-appearing patient with no hair sitting up in bed eating breakfast. ?Eyes: sclera anicteric, conjuctiva mild injection bilaterally ?CVS: S1-S2, regular  ?Respiratory:  decreased air entry bilaterally secondary to decreased inspiratory effort, rales at bases  ?GI: NABS, soft, NT  ?LE: No edema.  ?Psych: patient is logical and coherent, mood and  affect appropriate to situation. ? ? ?Assessment & Plan: ?  ?Disposition ?Patient is medically clear for discharge when a safe disposition can be arranged ?Insurance company denied rehab referral, I believe TOC will help daughter appeal denial ?Dr. Shanon Brow did a peer to peer with them on 07/11/21 ? ?DM 2 ?BS elevated with increasing p.o. intake and ongoing Decadron use ?Increase glargine to 15 ?Continue SSI ? 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 ?VTE/PE ?Continue Eliquis for PE with right heart strain on 05/08/2021 ? ? ?DVT prophylaxis: Eliquis ?Code Status: DNR ?Family Communication: None today ?Disposition Plan:  ? Patient is from: Home ? Anticipated Discharge Location: TBD ? Barriers to Discharge: Awaiting disposition for safe discharge ? Is patient medically stable for Discharge: Yes ? ? ?Scheduled Meds: ? amitriptyline  50 mg Oral QHS  ? apixaban  5 mg Oral BID  ? atorvastatin  40 mg Oral QPM  ? dexamethasone  4 mg Oral Q12H  ? famotidine  20 mg Oral BID  ? insulin aspart  0-6 Units Subcutaneous TID WC  ? insulin glargine-yfgn  10 Units Subcutaneous QHS  ? levETIRAcetam  500 mg Oral BID  ? sodium chloride flush  3 mL Intravenous Q12H  ?  vitamin B-12  1,000 mcg Oral Daily  ? ?Continuous Infusions: ? ?Data Reviewed: ? ?Basic Metabolic Panel: ?Recent Labs  ?Lab 07/07/21 ?1326 07/08/21 ?0446  ?NA 139 140  ?K 3.5 4.4  ?CL 107 108  ?CO2 25 22  ?GLUCOSE 87 192*  ?BUN 14 16  ?CREATININE 0.83 0.69  ?CALCIUM 9.1 10.2  ? ? ? ?CBC: ?Recent Labs  ?Lab 07/07/21 ?1326 07/08/21 ?0446  ?WBC 10.6* 7.3  ?HGB 11.3* 12.4  ?HCT 35.2* 38.9  ?MCV 91.7 91.3  ?PLT 249 306  ? ? ? ?Studies: ?No results found. ? ?Principal Problem: ?  Fall at home secondary to weakness ?Active Problems: ?  Diet-controlled type  2 diabetes mellitus ([place]) ?  Vasogenic brain edema ([place]) ?  Chronic anticoagulation ?  Non-small cell lung cancer metastatic to brain [facility]) with vasogenic brain edema ?  Normocytic anemia ?  Leukocytosis ?  History of DVT (deep vein thrombosis) ?  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 ? ? ? ?Dewaine Oats Derek Jack, ?[facility] ? ?If 7PM-7AM, please contact night-coverage ?[email or web address] ? ? LOS: 3 days  ? ?

## 2021-07-14 DIAGNOSIS — R531 Weakness: Secondary | ICD-10-CM | POA: Diagnosis not present

## 2021-07-14 LAB — GLUCOSE, CAPILLARY
Glucose-Capillary: 177 mg/dL — ABNORMAL HIGH (ref 70–99)
Glucose-Capillary: 463 mg/dL — ABNORMAL HIGH (ref 70–99)
Glucose-Capillary: 237 mg/dL — ABNORMAL HIGH (ref 70–99)
Glucose-Capillary: 192 mg/dL — ABNORMAL HIGH (ref 70–99)

## 2021-07-14 MED ORDER — INSULIN ASPART 100 UNIT/ML IJ SOLN
3.0000 [IU] | Freq: Two times a day (BID) | INTRAMUSCULAR | Status: DC
  Administered 2021-07-14 – 2021-07-22 (×16): 3 [IU] via SUBCUTANEOUS

## 2021-07-14 NOTE — Plan of Care (Signed)

## 2021-07-14 NOTE — TOC Progression Note (Signed)
Transition of Care (TOC) - Progression Note  ? ? ?Patient Details  ?Name: Katelyn Kennedy ?MRN: [id number] ?Date of Birth: April 13, 1946 ? ?Transition of Care (TOC) CM/SW Contact  ?Coralee Pesa, LCSWA ?Phone Number: ?07/14/2021, 4:12 PM ? ?Clinical Narrative:    ?CSW [facility] up with [facility] and [facility], they are unable to offer a LTC bed. Referral's were sent out further and [facility] is currently reviewing for possible placement. CSW followed up with [facility] who noted they would screen pt today for the appropriate Medicaid. CSW spoke with dtr. She had not filed the appeal yet or moved forward on [employer]. She asked if she should get rid of pt's apartment. CSW asked if she felt the pt may be able to return home at some point and [name] says she does not think so.  ?Palliative and hospice options at a facility were discussed and more family dynamics were brought up. Kim notes she does not think taking pt home with paid caregivers would be a possibility either. CSW will continue to work with family and search for appropriate placement options. ? ? ?Expected Discharge Plan: [place] ?Barriers to Discharge: Continued Medical Work up, Ship broker, Family Issues ? ?Expected Discharge Plan and Services ?Expected Discharge Plan: [place] ?  ?  ?  ?Living arrangements for the past 2 months: [place] ?Expected Discharge Date: 07/09/21               ?  ?  ?  ?  ?  ?  ?  ?  ?  ?  ? ? ?Social Determinants of Health (SDOH) Interventions ?  ? ?Readmission Risk Interventions ? ?  03/19/2021  ?  2:43 PM 11/08/2020  ?  3:25 PM  ?Readmission Risk Prevention Plan  ?Post Dischage Appt  Complete  ?Medication Screening  Complete  ?Transportation Screening Complete Complete  ?PCP or Specialist Appt within 3-5 Days Complete   ?[place] or Home Care Consult Complete   ?Social Work Consult for [place] Planning/Counseling Complete   ?Palliative Care Screening Not Applicable    ?Medication Review Press photographer) Complete   ? ? ?

## 2021-07-14 NOTE — Progress Notes (Addendum)
? ?PROGRESS NOTE ? ? ? ?Katelyn Kennedy  ?[id number]  ?DOB: October 23, 1945  ?DOA: 07/07/2021 ?PCP: [name], MD ?Outpatient Specialists: ? ? ?Hospital course: ? 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  Work-up revealed that fall was likely secondary to left-sided weakness from her brain mets.  She had recently been tapered off Decadron.  Patient was restarted on Decadron.  Work-up including CT of head noted extensive dural based hemorrhagic metastatic disease. ? ? ?Subjective: ? ?Patient without complaints, states she would like to sleep ? ? ?Objective: ?Vitals:  ? 07/13/21 1938 07/14/21 0344 07/14/21 0811 07/14/21 1634  ?BP: 105/62 116/68 125/77 (!) 93/56  ?Pulse: (!) 107 83 96 98  ?Resp: 19 17 17 18   ?Temp: 97.8 ?F (36.6 ?C) 97.6 ?F (36.4 ?C) 98.2 ?F (36.8 ?C) 97.7 ?F (36.5 ?C)  ?TempSrc: Oral  Oral Oral  ?SpO2: 96% 97% 97% 97%  ?Weight:      ?Height:      ? ? ?Intake/Output Summary (Last 24 hours) at 07/14/2021 1721 ?Last data filed at 07/14/2021 1706 ?Gross per 24 hour  ?Intake 600 ml  ?Output 600 ml  ?Net 0 ml  ? ? ?Filed Weights  ? 07/07/21 0238 07/08/21 0326  ?Weight: (P) 57.6 kg 56.4 kg  ? ? ? ?Exam: ? ?General: Chronically ill-appearing patient lying in bed, sleepy but arousable ?Eyes: sclera anicteric, conjuctiva mild injection bilaterally ?CVS: S1-S2, regular  ?Respiratory:  decreased air entry bilaterally secondary to decreased inspiratory effort, rales at bases  ?GI: NABS, soft, NT  ?LE: No edema.  ?Psych: patient is logical and coherent, mood and affect appropriate to situation. ? ? ?Assessment & Plan: ?  ?Disposition ?Patient is medically clear for discharge when a safe disposition can be arranged ?Insurance company denied rehab referral, I believe TOC will help daughter appeal denial ?Dr. Shanon Brow did a peer to peer with them on 07/11/21 ? ?DM 2 ?BS elevated with  increasing p.o. intake and ongoing Decadron use ?Continue glargine 15 ?Will add preprandial insulin 3 units before lunch and dinner, modification of diabetes coordinator recommendations ?Continue SSI ? 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 ?VTE/PE ?Continue Eliquis for PE with right heart strain on 05/08/2021 ? ? ?DVT prophylaxis: Eliquis ?Code Status: DNR ?Family Communication: None today ?Disposition Plan:  ? Patient is from: Home ? Anticipated Discharge Location: TBD ? Barriers to Discharge: Awaiting disposition for safe discharge ? Is patient medically stable for Discharge: Yes ? ? ?Scheduled Meds: ? amitriptyline  50 mg Oral QHS  ? apixaban  5 mg Oral BID  ? atorvastatin  40 mg Oral QPM  ? dexamethasone  4 mg Oral Q12H  ? famotidine  20 mg Oral BID  ? insulin aspart  0-6 Units Subcutaneous TID WC  ? insulin glargine-yfgn  15 Units Subcutaneous QHS  ? levETIRAcetam  500 mg Oral BID  ? sodium chloride flush  3 mL Intravenous Q12H  ? vitamin B-12  1,000 mcg Oral Daily  ? ?Continuous Infusions: ? ?Data Reviewed: ? ?Basic Metabolic Panel: ?Recent Labs  ?Lab 07/08/21 ?0446  ?NA 140  ?K 4.4  ?CL 108  ?CO2 22  ?GLUCOSE 192*  ?BUN 16  ?CREATININE 0.69  ?  CALCIUM 10.2  ? ? ? ?CBC: ?Recent Labs  ?Lab 07/08/21 ?0446  ?WBC 7.3  ?HGB 12.4  ?HCT 38.9  ?MCV 91.3  ?PLT 306  ? ? ? ?Studies: ?No results found. ? ?Principal Problem: ?  Fall at home secondary to weakness ?Active Problems: ?  Diet-controlled type 2 diabetes mellitus ([place]) ?  Vasogenic brain edema ([place]) ?  Chronic anticoagulation ?  Non-small cell lung cancer metastatic to brain [facility]) with vasogenic brain edema ?  Normocytic anemia ?  Leukocytosis ?  History of DVT (deep vein thrombosis) ?  History of pulmonary embolism and DVT on  chronic anticoagulation ? ? ? ? ?Dewaine Oats Derek Jack, ?[facility] ? ?If 7PM-7AM, please contact night-coverage ?[email or web address] ? ? LOS: 4 days  ? ?

## 2021-07-14 NOTE — Progress Notes (Signed)
Inpatient Diabetes Program Recommendations ? ?AACE/ADA: New Consensus Statement on Inpatient Glycemic Control (2015) ? ?Target Ranges:  Prepandial:   less than 140 mg/dL ?     Peak postprandial:   less than 180 mg/dL (1-2 hours) ?     Critically ill patients:  140 - 180 mg/dL  ? ?Lab Results  ?Component Value Date  ? GLUCAP 177 (H) 07/14/2021  ? HGBA1C 6.5 05/15/2021  ? ? ?Review of Glycemic Control ? Latest Reference Range & Units 07/13/21 08:49 07/13/21 13:01 07/13/21 15:51 07/13/21 21:06 07/14/21 08:12  ?Glucose-Capillary 70 - 99 mg/dL 144 (H) 350 (H) 326 (H) 207 (H) 177 (H)  ? ?Diabetes history: DM2 ?Outpatient Diabetes medications: Lantus 10 units q hs ?Current orders for Inpatient glycemic control:  ?Semglee 15 units qd ?Novolog 0-6 units tid correction ?Decadron 4 mg q 12 hrs. ?  ?Inpatient Diabetes Program Recommendations:   ?  ?Noted postprandial CBGs elevated. Please consider while on steroids: ?-Novolog 3 units tid meal coverage if eats 50% meal ? ?Will continue to follow while inpatient. ? ?Thank you, ?Tama Headings RN, MSN, BC-ADM ?Inpatient Diabetes Coordinator ?Team Pager [phone] (8a-5p) ?

## 2021-07-15 ENCOUNTER — Encounter: Payer: Self-pay | Admitting: Internal Medicine

## 2021-07-15 DIAGNOSIS — R531 Weakness: Secondary | ICD-10-CM | POA: Diagnosis not present

## 2021-07-15 DIAGNOSIS — Z1330 Encounter for screening examination for mental health and behavioral disorders, unspecified: Secondary | ICD-10-CM

## 2021-07-15 LAB — GLUCOSE, CAPILLARY
Glucose-Capillary: 141 mg/dL — ABNORMAL HIGH (ref 70–99)
Glucose-Capillary: 257 mg/dL — ABNORMAL HIGH (ref 70–99)
Glucose-Capillary: 191 mg/dL — ABNORMAL HIGH (ref 70–99)
Glucose-Capillary: 241 mg/dL — ABNORMAL HIGH (ref 70–99)

## 2021-07-15 NOTE — Progress Notes (Signed)
? ?PROGRESS NOTE ? ? ? ?Katelyn Kennedy  ?[id number]  ?DOB: 03/10/1946  ?DOA: 07/07/2021 ?PCP: [name], MD ?Outpatient Specialists: ? ? ?Hospital course: ? 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  Work-up revealed that fall was likely secondary to left-sided weakness from her brain mets.  She had recently been tapered off Decadron.  Patient was restarted on Decadron.  Work-up including CT of head noted extensive dural based hemorrhagic metastatic disease. ? ? ?Subjective: ? ?Patient without complaints, eating breakfast ? ? ?Objective: ?Vitals:  ? 07/14/21 1634 07/14/21 2256 07/15/21 0640 07/15/21 0758  ?BP: (!) 93/56 98/70 (!) 113/58 109/61  ?Pulse: 98 97 81 89  ?Resp: 18 17 17 18   ?Temp: 97.7 ?F (36.5 ?C) 97.8 ?F (36.6 ?C) 98.1 ?F (36.7 ?C) 97.6 ?F (36.4 ?C)  ?TempSrc: Oral Oral Oral   ?SpO2: 97% 98% 97% 97%  ?Weight:      ?Height:      ? ? ?Intake/Output Summary (Last 24 hours) at 07/15/2021 1641 ?Last data filed at 07/15/2021 1320 ?Gross per 24 hour  ?Intake 480 ml  ?Output 600 ml  ?Net -120 ml  ? ? ?Filed Weights  ? 07/07/21 0238 07/08/21 0326  ?Weight: (P) 57.6 kg 56.4 kg  ? ? ? ?Exam: ? ?General: Chronically ill-appearing patient lying in bed, sleepy but arousable ?Eyes: sclera anicteric, conjuctiva mild injection bilaterally ?CVS: S1-S2, regular  ?Respiratory:  decreased air entry bilaterally secondary to decreased inspiratory effort, rales at bases  ?GI: NABS, soft, NT  ?LE: No edema.  ?Psych: patient is logical and coherent, mood and affect appropriate to situation. ? ? ?Assessment & Plan: ?  ?Disposition ?Patient is medically clear for discharge when a safe disposition can be arranged ?Insurance company denied rehab referral, I believe TOC will help daughter appeal denial ?Dr. Shanon Brow did a peer to peer with them on 07/11/21 ?Patient herself would like to go home  although this is not a safe disposition for her. ?Patient was seen by psychiatry today and was found not to have decisional capacity ? ?DM 2 ?BS elevated with increasing p.o. intake and ongoing Decadron use ?Continue glargine 15 ?Blood sugars improved with addition of preprandial insulin 3 units before lunch and dinner,  ?Continue SSI ? 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 ?VTE/PE ?Continue Eliquis for PE with right heart strain on 05/08/2021 ? ? ?DVT prophylaxis: Eliquis ?Code Status: DNR ?Family Communication: None today ?Disposition Plan:  ? Patient is from: Home ? Anticipated Discharge Location: TBD ? Barriers to Discharge: Awaiting disposition for safe discharge ? Is patient medically stable for Discharge: Yes ? ? ?Scheduled Meds: ? amitriptyline  50 mg Oral QHS  ? apixaban  5 mg Oral BID  ? atorvastatin  40 mg Oral QPM  ? dexamethasone  4 mg Oral Q12H  ? famotidine  20 mg Oral BID  ? insulin aspart  0-6 Units Subcutaneous TID WC  ? insulin aspart  3 Units Subcutaneous BID WC  ? insulin glargine-yfgn  15 Units Subcutaneous QHS  ? levETIRAcetam  500 mg Oral BID  ? sodium chloride flush  3 mL Intravenous Q12H  ? vitamin B-12  1,000 mcg Oral Daily  ? ?Continuous  Infusions: ? ?Data Reviewed: ? ?Basic Metabolic Panel: ?No results for input(s): NA, K, CL, CO2, GLUCOSE, BUN, CREATININE, CALCIUM, MG, PHOS in the last 168 hours. ? ? ?CBC: ?No results for input(s): WBC, NEUTROABS, HGB, HCT, MCV, PLT in the last 168 hours. ? ? ?Studies: ?No results found. ? ?Principal Problem: ?  Fall at home secondary to weakness ?Active Problems: ?  Diet-controlled type 2 diabetes mellitus ([place]) ?  Vasogenic brain edema ([place]) ?  Chronic anticoagulation ?  Non-small cell lung cancer metastatic to brain  [facility]) with vasogenic brain edema ?  Normocytic anemia ?  Leukocytosis ?  History of DVT (deep vein thrombosis) ?  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 ? ? ? ?Dewaine Oats Derek Jack, ?[facility] ? ?If 7PM-7AM, please contact night-coverage ?[email or web address] ? ? LOS: 5 days  ? ?

## 2021-07-15 NOTE — Progress Notes (Signed)
Mobility Specialist Progress Note: ? ? 07/15/21 1018  ?Mobility  ?Activity Transferred from bed to chair  ?Level of Assistance Moderate assist, patient does 50-74%  ?Assistive Device Front wheel walker  ?Distance Ambulated (ft) 4 ft  ?Activity Response Tolerated well  ?$Mobility charge 1 Mobility  ? ?Pt received in bed wanting to get to chair. No complaints of pain. ModA+2 to stand and step to recliner. Left in chair with call bell in reach, all needs met and chair alarm activated.  ? ?Katelyn Kennedy ?Mobility Specialist ?Primary Phone [phone] ? ?

## 2021-07-15 NOTE — TOC Progression Note (Signed)
Transition of Care (TOC) - Progression Note  ? ? ?Patient Details  ?Name: AMELIE HOLLARS ?MRN: [id number] ?Date of Birth: Oct 26, 1945 ? ?Transition of Care (TOC) CM/SW Contact  ?Coralee Pesa, LCSWA ?Phone Number: ?07/15/2021, 12:30 PM ? ?Clinical Narrative:    ?CSW was notified by dtr [name], that she is not on pt's contact list with [employer] and they will not let her do the appeal. CSW was notified that pt is fairly oriented to day so CSW met with her at bedside. Pt notes she believes [name] is able to do the Appeal, CSW left a VM for [place].  ?CSW discussed DC plans with pt. While she is oriented, she is tangential and has little insight into the amount of care she would need to return home. CSW discussed both DC options, to a facility or finding care to return home. Pt states she does not need care and that her niece may be able to come every now and then.  ?FC screened her for LT Medicaid today, and she was confused stating the hospital said they would care for her when she went home. CSW reclarified what FC had discussed with her. Pt states she knows she does not have any assistance at home but refuses a facility. Pt states " If I get worse, I will just give up and go to hospice".  ?CSW has been working with family for possible guardianship and DC options while pt has been disoriented. CSW requested a competency screen before proceeding. CSW wants to be respectful of pt's wishes, but also the goal is for safety in the discharge plan. MD stated she would have psych see her. ?CSW spoke with [facility], who are considering pending a Medicaid #. FC stated they will put in application today. Pending competency, that will help to determine next steps. TOC will continue to follow for an appropriate DC plan.  ? ? ?Expected Discharge Plan: [place] ?Barriers to Discharge: Continued Medical Work up, Ship broker, Family Issues ? ?Expected Discharge Plan and Services ?Expected Discharge  Plan: [place] ?  ?  ?  ?Living arrangements for the past 2 months: [place] ?Expected Discharge Date: 07/09/21               ?  ?  ?  ?  ?  ?  ?  ?  ?  ?  ? ? ?Social Determinants of Health (SDOH) Interventions ?  ? ?Readmission Risk Interventions ? ?  03/19/2021  ?  2:43 PM 11/08/2020  ?  3:25 PM  ?Readmission Risk Prevention Plan  ?Post Dischage Appt  Complete  ?Medication Screening  Complete  ?Transportation Screening Complete Complete  ?PCP or Specialist Appt within 3-5 Days Complete   ?[place] or Home Care Consult Complete   ?Social Work Consult for [place] Planning/Counseling Complete   ?Palliative Care Screening Not Applicable   ?Medication Review Press photographer) Complete   ? ? ?

## 2021-07-15 NOTE — Progress Notes (Signed)
Physical Therapy Treatment ?Patient Details ?Name: Katelyn Kennedy ?MRN: [id number] ?DOB: Dec 02, 1945 ?Today's Date: 07/15/2021 ? ? ?History of Present Illness Pt is a 76 y.o. female admitted 07/07/21 after fall out of bed with reports of worsening L-side weakness related to brain metastases with prior resection. Head CT showing continued presence of brain mets with vasogenic edema and leftward shift of the brain. 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 ?  ?PT Comments  ? ? Pt with limited mobility progression, requiring maxA for standing transfers with stedy frame. Pt oriented, but demonstrates little to no awareness of current situation and amount of assist she will require if returning home. Pt continues to require increased cues to engage L-side extremities. Pt remains limited by generalized weakness, decreased activity tolerance, poor balance strategies/postural reactions and impaired cognition. Will continue to follow acutely to address established goals. ?   ?Recommendations for follow up therapy are one component of a multi-disciplinary discharge planning process, led by the attending physician.  Recommendations may be updated based on patient status, additional functional criteria and insurance authorization. ? ?Follow Up Recommendations ? Skilled nursing-short term rehab (<3 hours/day) ?  ?  ?Assistance Recommended at Discharge Frequent or constant Supervision/Assistance  ?Patient can return home with the following A lot of help with bathing/dressing/bathroom;Assistance with cooking/housework;Direct supervision/assist for financial management;Direct supervision/assist for medications management;Assist for transportation;Help with stairs or ramp for entrance;Two people to help with walking and/or transfers ?  ?Equipment Recommendations ? Wheelchair (measurements PT);Wheelchair cushion (measurements PT);Hospital bed;Lift  equipment ?  ?Recommendations for Other Services   ? ? ?  ?Precautions / Restrictions Precautions ?Precautions: Fall;Other (comment) ?Precaution Comments: h/o lung CA with brain mets (s/p resection R posterior frontal dural metastasis) with L-side weakness and inattention ?Restrictions ?Weight Bearing Restrictions: No  ?  ? ?Mobility ? Bed Mobility ?Overal bed mobility: Needs Assistance ?Bed Mobility: Sit to Supine ?  ?  ?  ?Sit to supine: Mod assist ?  ?General bed mobility comments: pt requesting return to supine due to fatigue. ModA for LLE management and trunk repositioning with return to supine ?  ? ?Transfers ?Overall transfer level: Needs assistance ?Equipment used: Ambulation equipment used (stedy) ?Transfers: Sit to/from Stand, Bed to chair/wheelchair/BSC ?Sit to Stand: Max assist ?  ?  ?  ?  ?  ?General transfer comment: MaxA for 2x sit<>stand from recliner into stedy frame; pt having difficulty extending bilat hips fully to allow stedy seat to close, requiring maxA for trunk elevation, hip extension and weight shifts; maxA to stand from stedy frame seat in order to clear buttocks to allow sitting on EOB; maxA for eccentric control; frequent external assist for LUE/LLE positioning ?Transfer via Lift Equipment: Stedy ? ?Ambulation/Gait ?  ?  ?  ?  ?  ?  ?  ?  ? ? ?Stairs ?  ?  ?  ?  ?  ? ? ?Wheelchair Mobility ?  ? ?Modified Rankin (Stroke Patients Only) ?  ? ? ?  ?Balance Overall balance assessment: Needs assistance ?Sitting-balance support: No upper extremity supported, Feet supported ?Sitting balance-Leahy Scale: Fair ?  ?Postural control: Left lateral lean ?Standing balance support: Bilateral upper extremity supported, During functional activity ?Standing balance-Leahy Scale: Zero ?Standing balance comment: reliant on BUE support, external assist and stedy frame to maintain static standing ?  ?  ?  ?  ?  ?  ?  ?  ?  ?  ?  ?  ? ?  ?  Cognition Arousal/Alertness: Awake/alert ?Behavior During Therapy: Flat  affect ?Overall Cognitive Status: No family/caregiver present to determine baseline cognitive functioning ?Area of Impairment: Orientation, Attention, Memory, Following commands, Safety/judgement, Awareness, Problem solving ?  ?  ?  ?  ?  ?  ?  ?  ?  ?Current Attention Level: Focused ?Memory: Decreased short-term memory ?Following Commands: Follows one step commands with increased time, Follows one step commands inconsistently ?Safety/Judgement: Decreased awareness of safety, Decreased awareness of deficits ?Awareness: Intellectual ?Problem Solving: Slow processing, Requires verbal cues, Decreased initiation ?  ?  ?  ? ?  ?Exercises Other Exercises ?Other Exercises: LUE reaching, LLE partial LAQ - requiring max cues to engage LUE/LLE ? ?  ?General Comments General comments (skin integrity, edema, etc.): pt's brother and sister-in-law present at end of session. pt requesting when she can go home. pt continues to demonstrate very poor insight into deficits and current situation, including amount of care she will require at home (assist +2) ?  ?  ? ?Pertinent Vitals/Pain Pain Assessment ?Pain Assessment: No/denies pain ?Pain Intervention(s): Monitored during session  ? ? ?Home Living   ?  ?  ?  ?  ?  ?  ?  ?  ?  ?   ?  ?Prior Function    ?  ?  ?   ? ?PT Goals (current goals can now be found in the care plan section) Acute Rehab PT Goals ?Time For Goal Achievement: 07/29/21 ?Progress towards PT goals: Goals downgraded-see care plan ? ?  ?Frequency ? ? ? Min 2X/week ? ? ? ?  ?PT Plan Current plan remains appropriate  ? ? ?Co-evaluation   ?  ?  ?  ?  ? ?  ?AM-PAC PT "6 Clicks" Mobility   ?Outcome Measure ? Help needed turning from your back to your side while in a flat bed without using bedrails?: A Lot ?Help needed moving from lying on your back to sitting on the side of a flat bed without using bedrails?: A Lot ?Help needed moving to and from a bed to a chair (including a wheelchair)?: Total ?Help needed standing up  from a chair using your arms (e.g., wheelchair or bedside chair)?: Total ?Help needed to walk in hospital room?: Total ?Help needed climbing 3-5 steps with a railing? : Total ?6 Click Score: 8 ? ?  ?End of Session Equipment Utilized During Treatment: Gait belt ?Activity Tolerance: Patient tolerated treatment well ?Patient left: in bed;with call bell/phone within reach;with bed alarm set;with family/visitor present ?Nurse Communication: Mobility status ?PT Visit Diagnosis: Unsteadiness on feet (R26.81);Muscle weakness (generalized) (M62.81);Repeated falls (R29.6);History of falling (Z91.81) ?  ? ? ?Time: 2774-1287 ?PT Time Calculation (min) (ACUTE ONLY): 17 min ? ?Charges:  $Therapeutic Activity: 8-22 mins          ?          ? ?Mabeline Caras, PT, DPT ?Acute Rehabilitation Services  ?Pager [phone] ?Office [phone] ? ?Derry Lory ?07/15/2021, 12:58 PM ? ?

## 2021-07-15 NOTE — Consult Note (Signed)
[facility] New Face-to-Face Psychiatric Evaluation ? ? ?Service Date: July 15, 2021 ?LOS:  LOS: 5 days  ? ? ?Assessment  ?Katelyn Kennedy is a 76 y.o. female admitted medically for 07/07/2021  2:34 AM for a fall . 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  ? ? ?Her current presentation of difficulty comprehending limitations to functional status is most consistent with known metastatic brain cancer w/ hx WBR. Current outpatient psychotropic medications include amitriptyline for insomnia and historically she has had a good response to these medications. She was compliant with medications prior to admission as evidenced by fill history. On initial examination, patient did not have capacity to refuse dc to SNF. Please see plan below for detailed recommendations.  ? ? ? ?In an evaluation of capacity, each of the following criteria must be met based on medical ?necessity in order for a patient to have capacity to make the decision in question. Of note, the capacity evaluation assesses only for the specified decision documented above and is not a determination of the patient's overall competency, which can only be adjudicated. ? ?Criterion 1: The patient demonstrates a clear and consistent voluntary choice with regard to treatment options. Yes ? ?Criterion 2: The patient adequately understands the disease they have, the treatment proposed, the risks of treatment, and the risks of other treatment (including no treatment). No (significant physical limitations with poor insight both on our conversation and as documented by PT) ? ?Criterion 3: The patient acknowledges that the details of Criterion 2 apply to them specifically and the likely consequences of treatment options proposed. No (thinks if she went home she would be "scared" but "get used to"  transfers) ? ?Criterion 4: The patient demonstrates adequate reasoning/rationality within the context of their decision and can provide justification for their choice. Yes (main concerns for SNF are financial which appears accurate based on SW notes) ? ?In this case, the patient does not have capacity to decide to discharge home AMA. See patient interview below for details. ? ? ?Diagnoses:  ?Active Hospital problems: ?Principal Problem: ?  Fall at home secondary to weakness ?Active Problems: ?  Diet-controlled type 2 diabetes mellitus ([place]) ?  Vasogenic brain edema ([place]) ?  Chronic anticoagulation ?  Non-small cell lung cancer metastatic to brain [facility]) with vasogenic brain edema ?  Normocytic anemia ?  Leukocytosis ?  History of DVT (deep vein thrombosis) ?  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  ? ? ?Plan  ?## Safety and Observation Level:  ?- Based on my clinical evaluation, I estimate the patient to be at low risk of self harm in the current setting ?- At this time, we recommend a routine level of observation. This decision is based on my review of the chart including patient's history and current presentation, interview of the patient, mental status examination, and consideration of suicide risk including evaluating suicidal ideation, plan, intent, suicidal or self-harm behaviors, risk factors, and protective factors. This judgment is based on our ability to directly address suicide risk, implement suicide prevention strategies and develop a safety plan while the patient is in the clinical setting. Please contact our team if there is a concern that risk level has changed. ? ? ?## Medications:  ?-- OK to continue home amitriptyline ? ? ?## Medical Decision Making Capacity:  ?  See above ? ?## Further Work-up:  ?-- None ? ?## Disposition:  ?-- Per primary ? ?Thank you for this consult request. Recommendations have been communicated to the primary team.  We will sign off at this time.   ? ?Joycelyn Schmid A Danika Kluender ? ? ?New history  ?Relevant Aspects of Hospital Course:  ?Admitted on 07/07/2021 for a fall likely due to left sided weakness from brain mets.  ? ?Patient Report:  ?Patient seen in late afternoon. She is oriented to self, location, situation. She thinks it is Easter Sunday and that it is May. She is aware that she is confused and feels time is passing too fast. Has difficulty telling the passing of time and is forgetting parts of conversations. Knows current president. Able to count from 42 to 27. No SI, HI, AH/VH.  ?  ?Grateful that she did not get seriously injured from the fall; feels like it "set her back". Patient's understanding of where she will go after the hospital is that it is "pending". She is aware of the recommendation to go to a nursing home and that it has not been approved by insurance. Her main concern appears to be that she has been denied by several nursing homes, may not be able to afford them, and that she may be abandoned by family.  ?  ?Wants her family to get training before they can take care of her.  ?Perseverative on feeling she did not need to go to the hospital to begin with - comes up several times during brief interview. Very poor insight into functional deficits.  Does not seem to realize they are secondary to progressive metastatic disease.  ? ?Patient states if she went home without 24 hour assistance she would be scared or uneasy about getting up at first but would feel better when family visited. Thinks if she practices moving at home it will eventually get easier. Has ability to regurgitate fall risk directly after discussing, but seems to forget this through conversation.  ?  ? ?ROS:  ?+ weakness ? ?Collateral information:  ?Not obtained ? ?Psychiatric History:  ?Information collected from Patient ?Minimal psychiatric history. On amitriptyline for insomnia. Never seen a psychiatrist, therapist, had inpt psych treatment, SI/SA.  ? ?Social History:   ?Lives alone with family support. ?Family History:  ? ?The patient's family history includes Breast cancer in her sister; Cancer in her maternal grandmother; Cancer (age of onset: 77) in her sister; Colon cancer (age of onset: 32) in her maternal grandmother; Diabetes in her mother; Hypothyroidism in her mother and sister. ? ?Medical History: ?Past Medical History:  ?Diagnosis Date  ? Allergic rhinitis   ? Arthritis   ? Diabetes mellitus without complication ([place])   ? diet control/no meds per pt  ? Dyspnea   ? Essential hypertension 04/11/2019  ? History of chicken pox   ? History of hiatal hernia   ? History of radiation therapy   ? right lung - 06/28/2017-08/11/2017  Dr Kyung Rudd  ? History of radiation therapy   ? SRS brain - 11/29/2020-12/04/2020  Dr Jenny Reichmann [place]/Matthew Tammi Klippel  ? History of radiation therapy   ? Whole brain - 03/10/2021-03/19/2021 Dr Gery Pray  ? lung ca dx'd 04/2017  ? Lung cancer ([place])   ? Peripheral vascular disease ([place])   ? Pneumonia   ? PONV (postoperative nausea and vomiting)   ? Smoker   ? ? ?Surgical History: ?Past Surgical History:  ?Procedure Laterality Date  ? ANKLE FRACTURE SURGERY Left  10 years ago  ? "hard to wake up from anesthesia" per pt  ? APPLICATION OF CRANIAL NAVIGATION Right 11/07/2020  ? Procedure: APPLICATION OF CRANIAL NAVIGATION;  Surgeon: Earnie Larsson, MD;  Location: [facility];  Service: Neurosurgery;  Laterality: Right;  ? [place]  ? BREAST BIOPSY Right 1978  ? BENIGN CYST  ? BREAST EXCISIONAL BIOPSY Left 2011  ? Benign  ? BREAST EXCISIONAL BIOPSY Right 1985  ? Benign   ? CATARACT EXTRACTION    ? CATARACT EXTRACTION W/ INTRAOCULAR LENS IMPLANT Bilateral   ? COLONOSCOPY  2011  ? CRANIOTOMY Right 11/07/2020  ? Procedure: Right Sided Craniotomy for Tumor;  Surgeon: Earnie Larsson, MD;  Location: [facility];  Service: Neurosurgery;  Laterality: Right;  ? ELECTROMAGNETIC NAVIGATION BROCHOSCOPY N/A 06/08/2017  ? Procedure: ELECTROMAGNETIC NAVIGATION BRONCHOSCOPY;   Surgeon: Flora Lipps, MD;  Location: [facility];  Service: Cardiopulmonary;  Laterality: N/A;  ? FRACTURE SURGERY    ? POLYPECTOMY  2011  ? TRANSURETHRAL RESECTION OF BLADDER TUMOR WITH MITOMYCIN-C N/A 05/04/2017

## 2021-07-16 DIAGNOSIS — Z9189 Other specified personal risk factors, not elsewhere classified: Secondary | ICD-10-CM

## 2021-07-16 DIAGNOSIS — R531 Weakness: Secondary | ICD-10-CM | POA: Diagnosis not present

## 2021-07-16 LAB — GLUCOSE, CAPILLARY
Glucose-Capillary: 184 mg/dL — ABNORMAL HIGH (ref 70–99)
Glucose-Capillary: 284 mg/dL — ABNORMAL HIGH (ref 70–99)
Glucose-Capillary: 261 mg/dL — ABNORMAL HIGH (ref 70–99)

## 2021-07-16 NOTE — Assessment & Plan Note (Signed)
Continue Eliquis ?Has been on Eliquis for PE/DVT since May 08, 2021 ?

## 2021-07-16 NOTE — Progress Notes (Signed)
Mobility Specialist Progress Note: ? ? 07/16/21 1042  ?Mobility  ?Activity  ?(STS, Leg and arm exercises)  ?Level of Assistance Minimal assist, patient does 75% or more  ?Assistive Device Front wheel walker  ?Activity Response Tolerated well  ?$Mobility charge 1 Mobility  ? ?Pt received in chair willing to participate in mobility. No complaints of pain. Pt able to complete 3 STS with min A then 2 STS without any assist. Once standing pt marched in place then needed to rest. Completed 3 sets of leg exercises and 2 sets of arm exercises. Left in chair with call bell in reach and all needs met.  ? ?Averill Winters ?Mobility Specialist ?Primary Phone [phone] ? ?

## 2021-07-16 NOTE — TOC Progression Note (Signed)
Transition of Care (TOC) - Progression Note  ? ? ?Patient Details  ?Name: LADAISHA PORTILLO ?MRN: [id number] ?Date of Birth: [birth year] ? ?Transition of Care (TOC) CM/SW Contact  ?Emeterio Reeve, LCSW ?Phone Number: ?07/16/2021, 11:57 AM ? ?Clinical Narrative:    ? ?Per psych note, pt does not have the decision making capacity. CSW is following for SNF and medicaid updates.   ? ?Expected Discharge Plan: [place] ?Barriers to Discharge: Continued Medical Work up, Ship broker, Family Issues ? ?Expected Discharge Plan and Services ?Expected Discharge Plan: [place] ?  ?  ?  ?Living arrangements for the past 2 months: [place] ?Expected Discharge Date: 07/09/21               ?  ?  ?  ?  ?  ?  ?  ?  ?  ?  ? ? ?Social Determinants of Health (SDOH) Interventions ?  ? ?Readmission Risk Interventions ? ?  03/19/2021  ?  2:43 PM 11/08/2020  ?  3:25 PM  ?Readmission Risk Prevention Plan  ?Post Dischage Appt  Complete  ?Medication Screening  Complete  ?Transportation Screening Complete Complete  ?PCP or Specialist Appt within 3-5 Days Complete   ?[facility] or Home Care Consult Complete   ?Social Work Consult for [place] Planning/Counseling Complete   ?Palliative Care Screening Not Applicable   ?Medication Review [employer]) Complete   ? ?Emeterio Reeve, LCSW ?Clinical Social Worker ? ? ?

## 2021-07-16 NOTE — Progress Notes (Signed)
? ? ? ? ? ? ? ? ? ? ? ? ? ? ? ? ? ? ? ? ? ?PROGRESS NOTE ? ? ? ?Patient: Katelyn Kennedy                            PCP: [name], MD                    ?DOB: 1945-10-02            DOA: 07/07/2021 ?OYD:[id number]             DOS: 07/16/2021, 3:24 PM ? ? LOS: 6 days  ? ?Date of Service: The patient was seen and examined on 07/16/2021 ? ?Subjective:  ? ?The patient was seen and examined this morning, sitting up in chair, awake alert, somewhat withdrawn depressed regarding her current situation of disposition. ?Patient still wishes to return home, if she can get enough help. ?Stating she is doing okay on steroids now. ?Hemodynamically stable ?No issues overnight. ? ? ?Brief Narrative:  ? ? 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  Work-up revealed that fall was likely secondary to left-sided weakness from her brain mets.  She had recently been tapered off Decadron.  Patient was restarted on Decadron.  Work-up including CT of head noted extensive dural based hemorrhagic metastatic disease. ? ? ? ?Assessment & Plan:  ? ?Principal Problem: ?  Fall at home secondary to weakness ?Active Problems: ?  Diet-controlled type 2 diabetes mellitus ([place]) ?  Vasogenic brain edema ([place]) ?  Chronic anticoagulation ?  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  Normocytic anemia ?  Leukocytosis ?  History of DVT (deep vein thrombosis) ?  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 ? ? ? ?Assessment and Plan: ?* Fall at home secondary to weakness ?-Patient presents after having a fall at home with reports of worsening weakness.  She had just recently been tapered off of Decadron.   ?-CT imaging showing continued presence of brain mets with vasogenic edema and leftward shift of the brain. ?Case was discussed with Dr. Mickeal Skinner  who recommended resuming Decadron 4 mg twice daily.  Patient has been given Decadron 10 mg IV x1 dose. ?-Decadron 4 mg twice daily ?-PT to eval and treat -recommending SNF, services denied ?-Transitions of care consulted -assisting with placement ? 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 ?-Continue Eliquis  ? ?History of DVT (deep vein thrombosis) ?On Eliquis ? ?Leukocytosis ?Acute.  WBC elevated at 10.6, but appears to be trending down from last check on 3/1 at 13.7.  Possibly related with patient being on steroids. ?-Check procalcitonin and urinalysis ?-No signs of infection, ?Drink ? ?Normocytic anemia ?Chronic.  Hemoglobin 11.1 g/dL which appears near patient's baseline ?-Continue to monitor ? ?Non-small cell lung cancer metastatic to brain [facility]) with vasogenic brain edema ?Patient had initially been diagnosed with suspicious metastatic pulmonary nodules and February 2019 with evidence of metastatic disease with solitary brain metastasis in July 2022.   ?-Continue Keppra ?-Decadron as recommended by Dr. Mickeal Skinner ?-Palliative care consulted following-patient  declined adjuvant and hospice ? ? ?Chronic anticoagulation ?Continue Eliquis ?Has been on Eliquis for PE/DVT since May 08, 2021 ? ?Vasogenic brain edema ([place]) ?Continue Decadron per neurosurgery recommendation ?-Currently on Decadron and Keppra ? ?Diet-controlled type 2 diabetes mellitus ([place]) ?Stable CBGs ?-Patient had been relatively well controlled.  ? Last hemoglobin A1c 6.5 on 06/04/2021.  Home regimen included Lantus 10 units nightly ?-Hypoglycemic protocols ?-Continue Lantus 10 units nightly ?-CBGs before every meal with very sensitive SSI ? ? ? ?Disposition ?Patient is medically clear for discharge when a safe disposition can be arranged ?Insurance company denied  rehab referral, I believe TOC will help daughter appeal denial ?Dr. Shanon Brow did a peer to peer with them on 07/11/21 ?Patient herself would like to go home although this is not a safe disposition for her. ?Patient was seen by psychiatry 07/15/2021 and was found not to have decisional capacity ? ? ? ? ?---------------------------------------------------------------------------------------------------------------- ? ?DVT prophylaxis:  ? ?apixaban (ELIQUIS) tablet 5 mg  ? ?Code Status:   Code Status: DNR ? ?Family Communication: No family member present at bedside- attempt will be made to update daily ?The above findings and plan of care has been discussed with patient (and family)  in detail,  ?they expressed understanding and agreement of above. ?-Advance care planning has been discussed.  ? ?Admission status:   ?Status is: Inpatient ?Remains inpatient appropriate because: Medically stable, difficult disposition as patient is unable to ambulate, dependent for all ADLs. ? ?Disposition: Patient needs SNF ?Discussed the case in detail with social worker Ms. Miranda Tekoa ?We diligently working on different parameters to get the patient qualified for coverage and SNF ?-  ?Patient is unable to return home at this point as she is unable to ambulate, requires assist with all ADLs, and family has denied ability to provide 24-hour care ? ? ? ? ?Procedures:  ? ?No admission procedures for hospital encounter. ? ? ?Antimicrobials:  ?Anti-infectives (From admission, onward)  ? ? None  ? ?  ? ? ? ?Medication:  ? amitriptyline  50 mg Oral QHS  ? apixaban  5 mg Oral BID  ? atorvastatin  40 mg Oral QPM  ? dexamethasone  4 mg Oral Q12H  ? famotidine  20 mg Oral BID  ? insulin aspart  0-6 Units Subcutaneous TID WC  ? insulin aspart  3 Units Subcutaneous BID WC  ? insulin glargine-yfgn  15 Units Subcutaneous QHS  ? levETIRAcetam  500 mg Oral BID  ? sodium chloride flush  3 mL Intravenous Q12H  ? vitamin B-12  1,000 mcg Oral Daily   ? ? ?acetaminophen **OR** acetaminophen, albuterol, docusate sodium, polyethylene glycol ? ? ?Objective:  ? ?Vitals:  ? 07/15/21 0758 07/15/21 2005 07/16/21 0544 07/16/21 0720  ?BP: 109/61 108/68 118/65 123/64  ?Pulse: 89 99 91 92  ?Resp: 18 16 17 16   ?Temp: 97.6 ?F (36.4 ?C) 98 ?F (36.7 ?C) 97.8 ?F (36.6 ?C) 97.9 ?F (36.6 ?C)  ?TempSrc:  Oral Oral Oral  ?SpO2: 97% 97% 96% 96%  ?Weight:      ?Height:      ? ? ?Intake/Output Summary (Last 24 hours) at 07/16/2021 1524 ?Last data filed at 07/15/2021 2220 ?Gross per 24 hour  ?Intake 480 ml  ?Output --  ?Net 480 ml  ? ?Filed Weights  ? 07/07/21 0238 07/08/21 0326  ?Weight: (P) 57.6 kg 56.4 kg  ? ? ? ?Examination:  ? ?Physical Exam  ?Constitution:  Alert, cooperative, no distress,  Appears calm and comfortable  ?  Psychiatric:   Normal and stable mood and affect, cognition intact,   ?HEENT:        Normocephalic, PERRL, otherwise with in Normal limits  ?Chest:         Chest symmetric ?Cardio vascular:  S1/S2, RRR, No murmure, No Rubs or Gallops  ?pulmonary: Clear to auscultation bilaterally, respirations unlabored, negative wheezes / crackles ?Abdomen: Soft, non-tender, non-distended, bowel sounds,no masses, no organomegaly ?Muscular skeletal: Severe global generalized weaknesses  ?Limited exam -sitting in chair, moving all 4 extremities, able to stand or walk  ?Neuro: CNII-XII intact. , normal motor and sensation, reflexes intact  ?Extremities: No pitting edema lower extremities, +2 pulses  ?Skin: Dry, warm to touch, negative for any Rashes, No open wounds ?Wounds: per nursing documentation ? ? ?------------------------------------------------------------------------------------------------------------------------------------------ ?  ? ?LABs:  ? ?  Latest Ref Rng & Units 07/08/2021  ?  4:46 AM 07/07/2021  ?  1:26 PM 06/11/2021  ?  2:49 PM  ?CBC  ?WBC 4.0 - 10.5 K/uL 7.3   10.6   13.7    ?Hemoglobin 12.0 - 15.0 g/dL 12.4   11.3   12.5    ?Hematocrit 36.0 - 46.0 % 38.9   35.2    38.6    ?Platelets 150 - 400 K/uL 306   249   339    ? ? ?  Latest Ref Rng & Units 07/08/2021  ?  4:46 AM 07/07/2021  ?  1:26 PM 06/11/2021  ?  2:49 PM  ?CMP  ?Glucose 70 - 99 mg/dL 192   87   171    ?BUN 8 - 23 mg/dL 1

## 2021-07-16 NOTE — Progress Notes (Signed)
Occupational Therapy Treatment ?Patient Details ?Name: Katelyn Kennedy ?MRN: [id number] ?DOB: [birth year] ?Today's Date: 07/16/2021 ? ? ?History of present illness Pt is a 76 y.o. female admitted 07/07/21 after fall out of bed with reports of worsening L-side weakness related to brain metastases with prior resection. Head CT showing continued presence of brain mets with vasogenic edema and leftward shift of the brain. 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 ?  ?OT comments ? Katelyn Kennedy is making functional progress towards her goals. She was able to stand several times with the steady given min A for verbal cues and minimal assist to boost into standing. Pt required cues throughout for attention, safety and carry over of education to functional tasks. Pt was able to stand in the steady frame and bathe frontal peri area with only one UE supported. Otherwise she was max A for LB bathing. She continues to be limited by impaired cognition, L weakness and poor activity tolerance. Pt continues to perseverate on going home with limited insight to safety and deficits.D/c recommendation remains appropriate.   ? ?Recommendations for follow up therapy are one component of a multi-disciplinary discharge planning process, led by the attending physician.  Recommendations may be updated based on patient status, additional functional criteria and insurance authorization. ?   ?Follow Up Recommendations ? Skilled nursing-short term rehab (<3 hours/day)  ?  ?Assistance Recommended at Discharge Frequent or constant Supervision/Assistance  ?Patient can return home with the following ? Two people to help with walking and/or transfers;A lot of help with bathing/dressing/bathroom;Assistance with cooking/housework;Assistance with feeding;Direct supervision/assist for medications management;Direct supervision/assist for financial management;Assist for  transportation;Help with stairs or ramp for entrance ?  ?Equipment Recommendations ? Other (comment)  ?  ?   ?Precautions / Restrictions Precautions ?Precautions: Fall;Other (comment) ?Precaution Comments: h/o lung CA with brain mets (s/p resection R posterior frontal dural metastasis) with L-side weakness and inattention ?Restrictions ?Weight Bearing Restrictions: No  ? ? ?  ? ?Mobility Bed Mobility ?Overal bed mobility: Needs Assistance ?  ?  ?  ?  ?  ?  ?General bed mobility comments: in chiar upon arrival ?  ? ?Transfers ?Overall transfer level: Needs assistance ?Equipment used: Ambulation equipment used ?Transfers: Sit to/from Stand ?Sit to Stand: Min assist ?  ?  ?  ?  ?  ?General transfer comment: stood several times with use of steady frame, min A given to boost and cues required each time wtih limited carry over of technique noted ?Transfer via Lift Equipment: Stedy ?  ?Balance Overall balance assessment: Needs assistance ?Sitting-balance support: Feet supported ?Sitting balance-Leahy Scale: Fair ?  ?Postural control: Left lateral lean ?Standing balance support: Single extremity supported, During functional activity ?Standing balance-Leahy Scale: Poor ?Standing balance comment: able to wash frontal peri area in standing with only L UE supported ?  ?  ?  ?  ?  ?  ?  ?  ?  ?  ?  ?   ? ?ADL either performed or assessed with clinical judgement  ? ?ADL Overall ADL's : Needs assistance/impaired ?Eating/Feeding: Set up;Sitting ?Eating/Feeding Details (indicate cue type and reason): pt sitting in chair eating upon arrival; pt had food all over her gown and in teh floor but seemingly unaware ?  ?  ?  ?  ?Lower Body Bathing: Maximal assistance;Sit to/from stand ?Lower Body Bathing Details (indicate cue type and reason): standing with use of steady frame ?  ?  ?  ?  ?  ?  ?  Toileting- Clothing Manipulation and Hygiene: Maximal assistance;Cueing for safety;Cueing for sequencing ?Toileting - Clothing Manipulation Details  (indicate cue type and reason): soiled with urine upon arrival. Pt standing wtih use of steady frame, able to use RUE to clean frontal peri area - ultimately max A for rear and thoroughness ?  ?  ?Functional mobility during ADLs: Minimal assistance (for sit<>stand only this date) ?General ADL Comments: pt soiled upon arrival - focused on bathing in standing with only 1 UE supported ?  ? ?Extremity/Trunk Assessment Upper Extremity Assessment ?Upper Extremity Assessment: LUE deficits/detail ?LUE Deficits / Details: uses L UE spontaneously in B UE ADLs, weakness greater proximal vs distal, impaired sensation ?LUE Sensation: decreased proprioception;decreased light touch ?LUE Coordination: decreased gross motor ?  ?Lower Extremity Assessment ?Lower Extremity Assessment: Defer to PT evaluation ?  ?  ?  ? ?Vision   ?Additional Comments: wear glasses ?  ?Perception Perception ?Perception: Within Functional Limits ?  ?Praxis Praxis ?Praxis: Intact ?  ? ?Cognition Arousal/Alertness: Awake/alert ?Behavior During Therapy: Flat affect ?Overall Cognitive Status: No family/caregiver present to determine baseline cognitive functioning ?  ?  ?  ?  ?  ?  ?  ?  ?  ?  ?  ?  ?  ?  ?  ?  ?General Comments: Pt following most functional cues with cues for attention. Pt is tangential and easily distracted. Slow processing and limited problem solving noted ?  ?  ?   ?   ?   ?General Comments VSS on RA  ? ? ?Pertinent Vitals/ Pain       Pain Assessment ?Pain Assessment: No/denies pain ?Pain Intervention(s): Monitored during session ? ? ?Frequency ? Min 2X/week  ? ? ? ? ?  ?Progress Toward Goals ? ?OT Goals(current goals can now be found in the care plan section) ? Progress towards OT goals: Progressing toward goals ? ?Acute Rehab OT Goals ?OT Goal Formulation: With patient ?Time For Goal Achievement: 07/23/21 ?Potential to Achieve Goals: Fair ?ADL Goals ?Pt Will Perform Eating: with modified independence;sitting ?Pt Will Perform Grooming:  with set-up;sitting ?Pt Will Perform Upper Body Bathing: with min assist;sitting ?Pt Will Perform Upper Body Dressing: with min assist;sitting ?Pt Will Transfer to Toilet: with mod assist;stand pivot transfer;bedside commode ?Pt/caregiver will Perform Home Exercise Program: Increased strength;Left upper extremity;With minimal assist ?Additional ADL Goal #1: Pt will locate visual targets in L hemispace with minimal verbal cues.  ?Plan Discharge plan remains appropriate   ? ?   ?AM-PAC OT "6 Clicks" Daily Activity     ?Outcome Measure ? ? Help from another person eating meals?: A Little ?Help from another person taking care of personal grooming?: A Little ?Help from another person toileting, which includes using toliet, bedpan, or urinal?: A Lot ?Help from another person bathing (including washing, rinsing, drying)?: A Lot ?Help from another person to put on and taking off regular upper body clothing?: A Little ?Help from another person to put on and taking off regular lower body clothing?: A Lot ?6 Click Score: 15 ? ?  ?End of Session Equipment Utilized During Treatment: Other (comment) (steady) ? ?OT Visit Diagnosis: Other abnormalities of gait and mobility (R26.89);Unsteadiness on feet (R26.81);Muscle weakness (generalized) (M62.81);Hemiplegia and hemiparesis;Other symptoms and signs involving cognitive function ?Hemiplegia - Right/Left: Left ?Hemiplegia - dominant/non-dominant: Non-Dominant ?Hemiplegia - caused by: Unspecified ?  ?Activity Tolerance Patient tolerated treatment well ?  ?Patient Left in chair;with call bell/phone within reach;with chair alarm set;with family/visitor present ?  ?Nurse Communication  Mobility status ?  ? ?   ? ?Time: 3009-2330 ?OT Time Calculation (min): 28 min ? ?Charges: OT General Charges ?$OT Visit: 1 Visit ?OT Treatments ?$Self Care/Home Management : 23-37 mins ? ? ? ?Oiva Dibari A Antawn Sison ?07/16/2021, 3:22 PM ?

## 2021-07-16 NOTE — Hospital Course (Addendum)
?  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  Right frontal mass was diagnosed in July 2022, last chemoradiation was 03/14/2021.  CT on 12/19 revealed multiple intracranial metastatic lesions with some hemorrhagic formation.  Patient underwent staging CT of the chest on 12/19 on 1/26 that revealed PE as well as additional nodules.  Oncologist spoke with neurosurgeon who cleared patient for IV heparin during that hospitalization.  DVT/PE on Eliquis and DM 2 was admitted 07/07/2021 after fall.  Work-up revealed that fall was likely secondary to left-sided weakness from her brain mets.  She had recently been tapered off Decadron.  Patient was restarted on Decadron.  Work-up including CT of head noted extensive dural based hemorrhagic metastatic disease. ?

## 2021-07-16 NOTE — Assessment & Plan Note (Signed)
Continue Decadron per neurosurgery recommendation ?-Currently on Decadron and Keppra ?

## 2021-07-16 NOTE — Assessment & Plan Note (Signed)
On Eliquis

## 2021-07-17 ENCOUNTER — Encounter: Payer: Self-pay | Admitting: Internal Medicine

## 2021-07-17 DIAGNOSIS — R531 Weakness: Secondary | ICD-10-CM | POA: Diagnosis not present

## 2021-07-17 LAB — GLUCOSE, CAPILLARY
Glucose-Capillary: 162 mg/dL — ABNORMAL HIGH (ref 70–99)
Glucose-Capillary: 179 mg/dL — ABNORMAL HIGH (ref 70–99)
Glucose-Capillary: 307 mg/dL — ABNORMAL HIGH (ref 70–99)
Glucose-Capillary: 361 mg/dL — ABNORMAL HIGH (ref 70–99)

## 2021-07-17 MED ORDER — INSULIN GLARGINE-YFGN 100 UNIT/ML ~~LOC~~ SOLN
17.0000 [IU] | Freq: Every day | SUBCUTANEOUS | Status: DC
  Administered 2021-07-17: 17 [IU] via SUBCUTANEOUS
  Filled 2021-07-17 (×2): qty 0.17

## 2021-07-17 NOTE — Progress Notes (Signed)
? ? ? ? ? ? ? ? ? ? ? ? ? ? ? ? ? ? ? ? ? ?PROGRESS NOTE ? ? ? ?Patient: Katelyn Kennedy                            PCP: [name], MD                    ?DOB: April 10, 1946            DOA: 07/07/2021 ?FOY:[id number]             DOS: 07/17/2021, 10:29 AM ? ? LOS: 7 days  ? ?Date of Service: The patient was seen and examined on 07/17/2021 ? ?Subjective:  ? ?The patient was seen and examined this morning, stable no acute distress, sitting up in chair having breakfast. ?No issues overnight ? ? ?Brief Narrative:  ? ? 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  Work-up revealed that fall was likely secondary to left-sided weakness from her brain mets.  She had recently been tapered off Decadron.  Patient was restarted on Decadron.  Work-up including CT of head noted extensive dural based hemorrhagic metastatic disease. ? ? ? ?Assessment & Plan:  ? ?Principal Problem: ?  Fall at home secondary to weakness ?Active Problems: ?  Diet-controlled type 2 diabetes mellitus ([place]) ?  Vasogenic brain edema ([place]) ?  Chronic anticoagulation ?  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  Normocytic anemia ?  Leukocytosis ?  History of DVT (deep vein thrombosis) ?  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 ? ? ? ?Assessment and Plan: ?* Fall at home secondary to weakness ?-Patient presents after having a fall at home with reports of worsening weakness.  She had just recently been tapered off of Decadron.   ?-CT imaging showing continued presence of brain mets with vasogenic edema and leftward shift of the brain. ?Case was discussed with Dr. Mickeal Skinner who recommended resuming Decadron 4 mg twice daily.  Patient has been given Decadron 10 mg IV x1 dose. ?-Decadron 4 mg twice daily ?-PT to eval and treat -recommending SNF,  services denied ?-Transitions of care consulted -assisting with placement ? 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 ?-Continue Eliquis  ? ?History of DVT (deep vein thrombosis) ?On Eliquis ? ?Leukocytosis ?Acute.  WBC elevated at 10.6, but appears to be trending down from last check on 3/1 at 13.7.  Possibly related with patient being on steroids. ?-Check procalcitonin and urinalysis ?-No signs of infection, ?Drink ? ?Normocytic anemia ?Chronic.  Hemoglobin 11.1 g/dL which appears near patient's baseline ?-Continue to monitor ? ?Non-small cell lung cancer metastatic to brain [facility]) with vasogenic brain edema ?Patient had initially been diagnosed with suspicious metastatic pulmonary nodules and February 2019 with evidence of metastatic disease with solitary brain metastasis in July 2022.   ?-Continue Keppra ?-Decadron as recommended by Dr. Mickeal Skinner ?-Palliative care consulted following-patient declined adjuvant and hospice ? ? ?Chronic anticoagulation ?Continue Eliquis ?Has been on Eliquis for PE/DVT since May 08, 2021 ? ?Vasogenic brain edema ([place]) ?Continue Decadron  per neurosurgery recommendation ?-Currently on Decadron and Keppra ? ?Diet-controlled type 2 diabetes mellitus ([place]) ?Stable CBGs ?-Patient had been relatively well controlled.  ? Last hemoglobin A1c 6.5 on 06/04/2021.  Home regimen included Lantus 10 units nightly ?-Hypoglycemic protocols ?-Continue Lantus 10 units nightly ?-CBGs before every meal with very sensitive SSI ? ? ? ?Disposition -remains complicated, and discussion with TOC and patient ?Patient is medically clear for discharge when a safe disposition can be arranged ?Insurance company denied rehab referral, I believe TOC will help daughter appeal denial ?Dr. Shanon Brow did a peer to peer with them on  07/11/21 ?Patient herself would like to go home although this is not a safe disposition for her. ?Patient was seen by psychiatry 07/15/2021 and was found not to have decisional capacity ? ? ? ? ?---------------------------------------------------------------------------------------------------------------- ? ?DVT prophylaxis:  ? ?apixaban (ELIQUIS) tablet 5 mg  ? ?Code Status:   Code Status: DNR ? ?Family Communication: No family member present at bedside- attempt will be made to update daily ?The above findings and plan of care has been discussed with patient (and family)  in detail,  ?they expressed understanding and agreement of above. ?-Advance care planning has been discussed.  ? ?Admission status:   ?Status is: Inpatient ?Remains inpatient appropriate because: Medically stable, difficult disposition as patient is unable to ambulate, dependent for all ADLs. ? ?Disposition: Patient needs SNF ?Discussed the case in detail with social worker Ms. Miranda Ridgway ?We diligently working on different parameters to get the patient qualified for coverage and SNF ?-  ?Patient is unable to return home at this point as she is unable to ambulate, requires assist with all ADLs, and family has denied ability to provide 24-hour care ? ? ? ? ?Procedures:  ? ?No admission procedures for hospital encounter. ? ? ?Antimicrobials:  ?Anti-infectives (From admission, onward)  ? ? None  ? ?  ? ? ? ?Medication:  ? amitriptyline  50 mg Oral QHS  ? apixaban  5 mg Oral BID  ? atorvastatin  40 mg Oral QPM  ? dexamethasone  4 mg Oral Q12H  ? famotidine  20 mg Oral BID  ? insulin aspart  0-6 Units Subcutaneous TID WC  ? insulin aspart  3 Units Subcutaneous BID WC  ? insulin glargine-yfgn  17 Units Subcutaneous QHS  ? levETIRAcetam  500 mg Oral BID  ? sodium chloride flush  3 mL Intravenous Q12H  ? vitamin B-12  1,000 mcg Oral Daily  ? ? ?acetaminophen **OR** acetaminophen, albuterol, docusate sodium, polyethylene glycol ? ? ?Objective:   ? ?Vitals:  ? 07/16/21 1621 07/16/21 2111 07/17/21 0511 07/17/21 0700  ?BP: 118/67 114/69 99/61 114/60  ?Pulse: 100 96 81 92  ?Resp: 16 16 16 14   ?Temp: 98.6 ?F (37 ?C) 98.4 ?F (36.9 ?C) 98.6 ?F (37 ?C) 97.6 ?F (36.4 ?C)  ?TempSrc: Oral Oral Oral Oral  ?SpO2: 96% 96% 99% 99%  ?Weight:      ?Height:      ? ? ?Intake/Output Summary (Last 24 hours) at 07/17/2021 1029 ?Last data filed at 07/16/2021 2140 ?Gross per 24 hour  ?Intake 240 ml  ?Output --  ?Net 240 ml  ? ?Filed Weights  ? 07/07/21 0238 07/08/21 0326  ?Weight: (P) 57.6 kg 56.4 kg  ? ? ? ?Examination:  ? ? ? ? ?Physical Exam: ?  ?General:  AAO x 3,  cooperative, no distress;   ?HEENT:  Normocephalic, PERRL, otherwise with in Normal limits   ?Neuro:  CNII-XII intact. ,  normal motor and sensation, reflexes intact   ?Lungs:   Clear to auscultation BL, Respirations unlabored,  ?No wheezes / crackles  ?Cardio:    S1/S2, RRR, No murmure, No Rubs or Gallops   ?Abdomen:  Soft, non-tender, bowel sounds active all four quadrants, ?no guarding or peritoneal signs.  ?Muscular  ?skeletal:  Limited exam - Severe global generalized weaknesses ?- in bed, able to move all 4 extremities,   ?2+ pulses,  symmetric, No pitting edema  ?Skin:  Dry, warm to touch, negative for any Rashes,  ?Wounds: Please see nursing documentation ?   ? ?--------------------------------------------------------------------------------------------------------------- ?  ? ?LABs:  ? ?  Latest Ref Rng & Units 07/08/2021  ?  4:46 AM 07/07/2021  ?  1:26 PM 06/11/2021  ?  2:49 PM  ?CBC  ?WBC 4.0 - 10.5 K/uL 7.3   10.6   13.7    ?Hemoglobin 12.0 - 15.0 g/dL 12.4   11.3   12.5    ?Hematocrit 36.0 - 46.0 % 38.9   35.2   38.6    ?Platelets 150 - 400 K/uL 306   249   339    ? ? ?  Latest Ref Rng & Units 07/08/2021  ?  4:46 AM 07/07/2021  ?  1:26 PM 06/11/2021  ?  2:49 PM  ?CMP  ?Glucose 70 - 99 mg/dL 192   87   171    ?BUN 8 - 23 mg/dL 16   14   31     ?Creatinine 0.44 - 1.00 mg/dL 0.69   0.83   0.85    ?Sodium 135 - 145  mmol/L 140   139   135    ?Potassium 3.5 - 5.1 mmol/L 4.4   3.5   4.1    ?Chloride 98 - 111 mmol/L 108   107   100    ?CO2 22 - 32 mmol/L 22   25   27     ?Calcium 8.9 - 10.3 mg/dL 10.2   9.1   9.7    ?Total Protein 6.5 - 8.1 g/dL  6.

## 2021-07-17 NOTE — Progress Notes (Signed)
Mobility Specialist Progress Note: ? ? 07/17/21 1041  ?Mobility  ?Activity Transferred from bed to chair  ?Level of Assistance Minimal assist, patient does 75% or more  ?Assistive Device Stedy  ?Activity Response Tolerated well  ?$Mobility charge 1 Mobility  ? ?Pt received in bed willing to participate in mobility. No complaints of pain. Pt needing lots of cues to stay on task and for hand placement. Left in chair with call bell in reach and all needs met.  ? ?Katelyn Kennedy ?Mobility Specialist ?Primary Phone [phone] ? ?

## 2021-07-18 DIAGNOSIS — R531 Weakness: Secondary | ICD-10-CM | POA: Diagnosis not present

## 2021-07-18 LAB — GLUCOSE, CAPILLARY
Glucose-Capillary: 139 mg/dL — ABNORMAL HIGH (ref 70–99)
Glucose-Capillary: 232 mg/dL — ABNORMAL HIGH (ref 70–99)
Glucose-Capillary: 293 mg/dL — ABNORMAL HIGH (ref 70–99)
Glucose-Capillary: 208 mg/dL — ABNORMAL HIGH (ref 70–99)

## 2021-07-18 MED ORDER — INSULIN GLARGINE-YFGN 100 UNIT/ML ~~LOC~~ SOLN
20.0000 [IU] | Freq: Every day | SUBCUTANEOUS | Status: DC
  Administered 2021-07-18 – 2021-07-29 (×10): 20 [IU] via SUBCUTANEOUS
  Filled 2021-07-18 (×14): qty 0.2

## 2021-07-18 NOTE — Plan of Care (Signed)
  Problem: Nutrition: Goal: Adequate nutrition will be maintained Outcome: Progressing   Problem: Pain Managment: Goal: General experience of comfort will improve Outcome: Progressing   Problem: Safety: Goal: Ability to remain free from injury will improve Outcome: Progressing   

## 2021-07-18 NOTE — TOC Progression Note (Signed)
Transition of Care (TOC) - Progression Note  ? ? ?Patient Details  ?Name: Katelyn Kennedy ?MRN: [id number] ?Date of Birth: Mar 12, 1946 ? ?Transition of Care (TOC) CM/SW Contact  ?Curlene Labrum, RN ?Phone Number: ?07/18/2021, 12:39 PM ? ?Clinical Narrative:    ?Patient's TOC needs transferred to the DTP Team for case management and MSW support.  I called and left a detailed voicemail with the patient's daughter, [name], introducing myself and my role for discharge planning for the patient. ? ?CM and MSW with DTP Team will continue to follow the patient for discharge planning needs - pending Medicaid application with financial counseling. ? ? ?Expected Discharge Plan: [place] ?Barriers to Discharge: Continued Medical Work up, Ship broker, Family Issues ? ?Expected Discharge Plan and Services ?Expected Discharge Plan: [place] ?  ?  ?  ?Living arrangements for the past 2 months: [place] ?Expected Discharge Date: 07/09/21               ?  ?  ?  ?  ?  ?  ?  ?  ?  ?  ? ? ?Social Determinants of Health (SDOH) Interventions ?  ? ?Readmission Risk Interventions ? ?  07/18/2021  ? 12:39 PM 03/19/2021  ?  2:43 PM 11/08/2020  ?  3:25 PM  ?Readmission Risk Prevention Plan  ?Post Dischage Appt   Complete  ?Medication Screening   Complete  ?Transportation Screening Complete Complete Complete  ?PCP or Specialist Appt within 3-5 Days  Complete   ?[place] or Home Care Consult  Complete   ?Social Work Consult for [place] Planning/Counseling  Complete   ?Palliative Care Screening  Not Applicable   ?Medication Review Press photographer) Complete Complete   ?PCP or Specialist appointment within 3-5 days of discharge Complete    ?[place] or Home Care Consult Complete    ?SW Recovery Care/Counseling Consult Complete    ?Palliative Care Screening Complete    ?Skilled Nursing Facility Complete    ? ? ?

## 2021-07-18 NOTE — Progress Notes (Addendum)
[facility] ?PROGRESS NOTE ? ?Katelyn Kennedy JYN:[id number] DOB: 05/04/1945 DOA: 07/07/2021 ?PCP: [name], MD ? ?Status: ?Remains inpatient appropriate because:  ?Unsafe discharge plan-patient continues to require 2+ assist for any mobility.  Patient was living with elderly aunts prior to admission and would need to have significant improvement in mobility before can return to that home setting ?Medicaid has been applied for.  Patient's retirement funding is not enough to pay for SNF alone.  Patient does not meet qualifications for rehab so insurance has denied. ? ?Barriers to discharge: ?Social: ?Family at this time unable to provide care given patient's significant debility ? ?Clinical: ?Continued debility ?Patient has been deemed to lack capacity by the psychiatric team ? ?Level of care:  ?Telemetry Medical ? ? ?Code Status: DNR ?Family Communication: Patient only ?DVT prophylaxis: Eliquis ?COVID vaccination status: Pfizer 08/11/2019, 10/11/2019 ? ?HPI: ? 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  Work-up revealed that fall was likely secondary to left-sided weakness from her brain mets.  She had recently been tapered off Decadron.  Patient was restarted on Decadron.  Work-up including CT of head noted extensive dural based hemorrhagic metastatic disease. ? ?Subjective: ?Patient pleasant and sitting up in bed eating breakfast.  No specific complaints ? ?Objective: ?Vitals:  ? 07/17/21 2003 07/18/21 0636  ?BP: 116/61 114/68  ?Pulse: (!) 105 90  ?Resp: 17 17  ?Temp: 98 ?F (36.7 ?C) 98.4 ?F (36.9 ?C)  ?SpO2: 98% 97%  ? ? ?Intake/Output Summary (Last 24 hours) at 07/18/2021 0819 ?Last data filed at 07/17/2021 1839 ?Gross per 24 hour  ?Intake 360 ml  ?Output --  ?Net 360 ml  ? ?Filed Weights  ? 07/07/21 0238 07/08/21 0326  ?Weight: (P) 57.6 kg 56.4 kg   ? ? ?Exam: ? ?Constitutional: NAD, calm, comfortable ?Respiratory: clear to auscultation bilaterally, no wheezing, no crackles. Normal respiratory effort. No accessory muscle use.  ?Cardiovascular: Regular rate and rhythm, no murmurs / rubs / gallops. No extremity edema.  ?Abdomen: no tenderness, no masses palpated. No hepatosplenomegaly. Bowel sounds positive. LBM 4/7 ?Neurologic: Cranial nerves II through XII appear to be grossly intact.  Moving all extremities equally but weakly with generalized strength 3+-4/5.  Sensation intact ?Psychological: Alert and oriented x 3. Normal mood.  ? ? ?Assessment/Plan: ?* Fall at home secondary to weakness ?-Patient presents after having a fall at home with reports of worsening weakness.  She had just recently been tapered off of Decadron.   ?-Secondary to metastatic cancer and associated vasogenic edema ?-Continue PT and OT ?-Transitions of care consulted -assisting with placement-continues to require significant assistance with mobility.  Patient would have to have marked improvement in her mobility before she could be discharged home with 2 elderly aunts ?-TOC/servant Center have assisted with Medicaid application and awaiting for a number.  Once number received can proceed with placement ?  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 ?-Continue Eliquis  ?  ?History of DVT (deep vein thrombosis) ?On Eliquis ?  ?Leukocytosis ?-Secondary to stress demargination from steroid ?  ?Normocytic anemia ?Chronic.  Hemoglobin 11.1 g/dL which appears near patient's baseline ?-Continue to monitor ?  ?Non-small cell lung cancer metastatic to brain with vasogenic brain edema ?  Patient had initially been diagnosed with suspicious metastatic pulmonary nodules and February 2019 with evidence of metastatic disease  with solitary brain metastasis in July 2022.   ?-Continue oral Decadron as recommended by neurology and neurosurgical team ?-Palliative care consulted following-patient declined adjuvant and hospice ?-Due to metastatic cancer to the brain patient has developed cognitive impairment and has been evaluated by the psychiatric team and no longer lacks capacity ?  ?Vasogenic brain edema  ?Continue Decadron per neurosurgery recommendation ?-Currently on Decadron and Keppra ?  ?Diet-controlled type 2 diabetes mellitus ([place]) ?Stable CBGs ? Last hemoglobin A1c 6.5 on 06/04/2021.   ?-Continue Lantus 10 units nightly ?-Continue SSI ?  ? ? ? ?Data Reviewed: ?Basic Metabolic Panel: ?No results for input(s): NA, K, CL, CO2, GLUCOSE, BUN, CREATININE, CALCIUM, MG, PHOS in the last 168 hours. ?Liver Function Tests: ?No results for input(s): AST, ALT, ALKPHOS, BILITOT, PROT, ALBUMIN in the last 168 hours. ?No results for input(s): LIPASE, AMYLASE in the last 168 hours. ?No results for input(s): AMMONIA in the last 168 hours. ?CBC: ?No results for input(s): WBC, NEUTROABS, HGB, HCT, MCV, PLT in the last 168 hours. ?Cardiac Enzymes: ?No results for input(s): CKTOTAL, CKMB, CKMBINDEX, TROPONINI in the last 168 hours. ?BNP (last 3 results) ?No results for input(s): BNP in the last 8760 hours. ? ?ProBNP (last 3 results) ?No results for input(s): PROBNP in the last 8760 hours. ? ?CBG: ?Recent Labs  ?Lab 07/16/21 ?1624 07/17/21 ?0832 07/17/21 ?1234 07/17/21 ?1714 07/17/21 ?2009  ?GLUCAP 261* 162* 179* 307* 361*  ? ? ?No results found for this or any previous visit (from the past 240 hour(s)).  ? ?Studies: ?No results found. ? ?Scheduled Meds: ? amitriptyline  50 mg Oral QHS  ? apixaban  5 mg Oral BID  ? atorvastatin  40 mg Oral QPM  ? dexamethasone  4 mg Oral Q12H  ? famotidine  20 mg Oral BID  ? insulin aspart  0-6 Units Subcutaneous TID WC  ? insulin aspart  3 Units Subcutaneous BID WC  ? insulin glargine-yfgn  20 Units Subcutaneous QHS  ?  levETIRAcetam  500 mg Oral BID  ? sodium chloride flush  3 mL Intravenous Q12H  ? vitamin B-12  1,000 mcg Oral Daily  ? ?Continuous Infusions: ? ?Principal Problem: ?  Fall at home secondary to weakness ?Active Problems: ?  Diet-controlled type 2 diabetes mellitus ([place]) ?  Vasogenic brain edema ([place]) ?  Chronic anticoagulation ?  Non-small cell lung cancer metastatic to brain [facility]) with vasogenic brain edema ?  Normocytic anemia ?  Leukocytosis ?  History of DVT (deep vein thrombosis) ?  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 ? ?Consultants: ?Neuro oncology telephonic based consultation per ER physician ?Palliative medicine ? ?Procedures: ?None ? ?Antibiotics: ?None  ? ? ?Time spent: 35 minutes ? ? ? ?Erin Hearing ANP  ?[facility] ?7 am - 330 pm/M-F for direct patient care and secure chat ?Please refer to Amion for contact info ?8  days ? ? ?Attestation: ?The patient was seen and examined today.  All labs reports were reviewed ?Discussed with Education officer, museum, still pending SNF discharge, difficult to discharge ?Medically stable for discharge.  Pending financial social issues TOC following closely. ?Unsafe to discharge due to multiple comorbidities, severe debility. ? ?Agree with above assessment and plan. ? ?[facility] hospitalist  ?Dr. Skipper Cliche ? ? ? ? ? ?  ?

## 2021-07-18 NOTE — Progress Notes (Signed)
PT Cancellation Note ? ?Patient Details ?Name: Katelyn Kennedy ?MRN: [id number] ?DOB: [birth year] ? ? ?Cancelled Treatment:    Reason Eval/Treat Not Completed: Fatigue/lethargy limiting ability to participate.  Pt was seen for mobility but declines any therapy.  Retry as time and pt allow. ? ? ?Ramond Dial ?07/18/2021, 12:17 PM ? ?Mee Hives, PT PhD ?Acute Rehab Dept. Number: [facility] [phone] and [place] [phone] ? ?

## 2021-07-18 NOTE — TOC Progression Note (Addendum)
Transition of Care (TOC) - Progression Note  ? ? ?Patient Details  ?Name: Katelyn Kennedy ?MRN: [id number] ?Date of Birth: Aug 02, 1945 ? ?Transition of Care (TOC) CM/SW Contact  ?Emeterio Reeve, LCSW ?Phone Number: ?07/18/2021, 10:09 AM ? ?Clinical Narrative:    ? ?CSW received email from financial counseling stating that pts medicaid application has been submitted.  ? ?CSW received phone calls from [place]. CSW informed both of pts medicaid application and also explained that pt was moved to the DTP team and they will contact them moving forward.  ? ?Expected Discharge Plan: [place] ?Barriers to Discharge: Continued Medical Work up, Ship broker, Family Issues ? ?Expected Discharge Plan and Services ?Expected Discharge Plan: [place] ?  ?  ?  ?Living arrangements for the past 2 months: [place] ?Expected Discharge Date: 07/09/21               ?  ?  ?  ?  ?  ?  ?  ?  ?  ?  ? ? ?Social Determinants of Health (SDOH) Interventions ?  ? ?Readmission Risk Interventions ? ?  03/19/2021  ?  2:43 PM 11/08/2020  ?  3:25 PM  ?Readmission Risk Prevention Plan  ?Post Dischage Appt  Complete  ?Medication Screening  Complete  ?Transportation Screening Complete Complete  ?PCP or Specialist Appt within 3-5 Days Complete   ?[place] or Home Care Consult Complete   ?Social Work Consult for [place] Planning/Counseling Complete   ?Palliative Care Screening Not Applicable   ?Medication Review Press photographer) Complete   ? ?Emeterio Reeve, LCSW ?Clinical Social Worker ? ?

## 2021-07-18 NOTE — Care Management Important Message (Signed)
Important Message ? ?Patient Details  ?Name: Katelyn Kennedy ?MRN: [id number] ?Date of Birth: 1945-05-05 ? ? ?Medicare Important Message Given:  Yes ? ? ? ? ?Shyquan Stallbaumer ?07/18/2021, 12:28 PM ?

## 2021-07-19 DIAGNOSIS — R531 Weakness: Secondary | ICD-10-CM | POA: Diagnosis not present

## 2021-07-19 LAB — GLUCOSE, CAPILLARY
Glucose-Capillary: 139 mg/dL — ABNORMAL HIGH (ref 70–99)
Glucose-Capillary: 221 mg/dL — ABNORMAL HIGH (ref 70–99)
Glucose-Capillary: 271 mg/dL — ABNORMAL HIGH (ref 70–99)
Glucose-Capillary: 260 mg/dL — ABNORMAL HIGH (ref 70–99)

## 2021-07-19 NOTE — Progress Notes (Addendum)
Manufacturing engineer [facility]) Hospital Liaison Note ? ?Referral received for patient/family interest in [facility]. Chart under review by [facility] physician.  ? ?Unfortunately, she is not eligible for [facility] at this time. ? ?Please call with any questions or concerns. Thank you ? ?Roselee Nova, LCSW ?[place] Hospital Liaison  ?[phone] ?

## 2021-07-19 NOTE — Plan of Care (Signed)
  Problem: Pain Managment: Goal: General experience of comfort will improve Outcome: Progressing   Problem: Safety: Goal: Ability to remain free from injury will improve Outcome: Progressing   Problem: Skin Integrity: Goal: Risk for impaired skin integrity will decrease Outcome: Progressing   

## 2021-07-19 NOTE — TOC Progression Note (Signed)
Transition of Care (TOC) - Progression Note  ? ? ?Patient Details  ?Name: Katelyn Kennedy ?MRN: [id number] ?Date of Birth: September 24, 1945 ? ?Transition of Care (TOC) CM/SW Contact  ?[place], LCSWA ?Phone Number: ?07/19/2021, 2:25 PM ? ?Clinical Narrative:    ? ?CSW received request from MD to speak with pt's sister about Hospice. CSW spoke with pt's sister Katelyn Kennedy about their wishes, Katelyn Kennedy states they are in agreement with MD to look at Hospice options and is interested in [facility]. CSW spoke with [place] with [facility] and requested someone reach out to family. Fabio Pierce states she is unsure of whether or not she will make it out today but if she is not able to come today, someone will be out to assess pt tomorrow. MD made aware.  ? ?Expected Discharge Plan: [place] ?Barriers to Discharge: Continued Medical Work up, Ship broker, Family Issues ? ?Expected Discharge Plan and Services ?Expected Discharge Plan: [place] ?  ?  ?  ?Living arrangements for the past 2 months: [place] ?Expected Discharge Date: 07/09/21               ?  ?  ?  ?  ?  ?  ?  ?  ?  ?  ? ? ?Social Determinants of Health (SDOH) Interventions ?  ? ?Readmission Risk Interventions ? ?  07/18/2021  ? 12:39 PM 03/19/2021  ?  2:43 PM 11/08/2020  ?  3:25 PM  ?Readmission Risk Prevention Plan  ?Post Dischage Appt   Complete  ?Medication Screening   Complete  ?Transportation Screening Complete Complete Complete  ?PCP or Specialist Appt within 3-5 Days  Complete   ?[place] or Home Care Consult  Complete   ?Social Work Consult for [place] Planning/Counseling  Complete   ?Palliative Care Screening  Not Applicable   ?Medication Review Press photographer) Complete Complete   ?PCP or Specialist appointment within 3-5 days of discharge Complete    ?[place] or Home Care Consult Complete    ?SW Recovery Care/Counseling Consult Complete    ?Palliative Care Screening Complete    ?Skilled Nursing Facility  Complete    ? ? ?

## 2021-07-19 NOTE — Progress Notes (Signed)
?PROGRESS NOTE ? ?Katelyn Kennedy  ?DOB: February 26, 1946  ?PCP: Katelyn Sanders, MD ?TML:[id number]  ?DOA: 07/07/2021 ? LOS: 9 days  ?Hospital Day: 13 ? ?Brief narrative: ?Katelyn Kennedy is a 76 y.o. female with PMH significant for DM2, HTN, PVD, lung cancer with brain mets s/p resection and chemoradiation, DVT/PE on Eliquis. ?Patient was brought to the ED from home on 3/27 from home after she slid out of bed and fell to the floor. ?She was being taken care at home by family members over the years of 47 and their own physical limitations.  Family members were not able to pick her up when she fell and hence EMS was called and she was brought to the ED ?In the ED, CT scan of head (compared to CT scan and MRIs from last 2 months) showed extensive dural based hemorrhagic metastatic disease without significant change in extensive vasogenic edema or midline shift small volume extra-axial hemorrhage along the right frontal convexity with superimposed traumatic hemorrhage. ?Patient was admitted to hospital service ? ?Subjective: ?Patient was seen and examined this morning.  Pleasant elderly Caucasian female.  Propped up in bed.  Not in distress.  Alert, awake, able to have a conversation.  Is remarkable how her mental status is close to normal despite significant findings of extensive hemorrhage and vasogenic edema in the CT scan. ?One of her family members was at bedside. ?I had a long conversation with her about the severity of her condition and likely decompensation.  Patient understands.  It seems she has passed through the phase of denial and now accepts her unfortunate situation. ?She agrees to consideration of residential versus home hospice. ? ?Principal Problem: ?  Fall at home secondary to weakness ?Active Problems: ?  Diet-controlled type 2 diabetes mellitus ([place]) ?  Vasogenic brain edema ([place]) ?  Chronic anticoagulation ?  Non-small cell lung cancer metastatic to brain [facility]) with vasogenic brain edema ?  Normocytic  anemia ?  Leukocytosis ?  History of DVT (deep vein thrombosis) ?  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  ? ?Assessment and Plan: ?Small traumatic subdural hemorrhage ?Non-small cell lung cancer ?Extensive hemorrhagic brain metastasis extensive vasogenic edema ?-Patient presented after a fall at home.   ?-Known history of lung cancer with brain mets s/p resection, chemoradiation and recent scans showing extensive hemorrhagic mets and vasogenic edema  ?-New CT head on admission showed superimposed small traumatic subdural hemorrhage. ?-Neurosurgery was consulted and patient was started on Decadron.  Continue Keppra for seizure prophylaxis ?-Palliative care consultation was obtained.  Patient declined hospice care. ?-PT OT eval obtained.  SNF recommended ?-TOC consulted.  Medicaid application in process for placement need ?-After my conversation this morning, patient seems more considering to hospice option.  Social work consulted.  Patient to be seen by hospice nurse tomorrow for more detailed input. ? ?History of DVT/PE on chronic anticoagulation  ?-PE was diagnosed on 05/08/2021.   ?-Despite hemorrhagic metastasis in brain, patient had chosen to continue Eliquis.  If she takes hospice route, she understands, she would have less benefit and more risk with Eliquis. ?  ?Type 2 diabetes mellitus ?-A1c 6.5 in February 2023 ?-Currently on Lantus 10 nightly and sliding scale insulin with Accu-Cheks. ?Recent Labs  ?Lab 07/18/21 ?1246 07/18/21 ?1544 07/18/21 ?1945 07/19/21 ?6812 07/19/21 ?1107  ?GLUCAP 232* 293* 208* 139* 221*  ? ?Goals of care ?  Code Status: DNR  ? ? ?Mobility: Limited mobility ? ?Nutritional status:  ?Body  mass index is 25.99 kg/m?.  ?  ?  ? ? ? ? ?Diet:  ?Diet Order   ? ?       ?  Diet - low sodium heart healthy       ?  ?  Diet regular Room service appropriate? Yes; Fluid consistency: Thin  Diet effective now       ?  ? ?  ?  ? ?  ? ? ?DVT prophylaxis:  ? ?apixaban (ELIQUIS)  tablet 5 mg   ?Antimicrobials: None ?Fluid: None ?Consultants: None at this time ?Family Communication: Family member at bedside ? ?Status is: Inpatient ? ?Continue in-hospital care because: Needs better disposition plan.  Preferably hospice ?Level of care: Telemetry Medical  ? ?Dispo: The patient is from: Home ?             Anticipated d/c is to: Likely residential versus home hospice ?             Patient currently is not medically stable to d/c. ?  Difficult to place patient No ? ? ? ? ?Infusions:  ? ? ?Scheduled Meds: ? amitriptyline  50 mg Oral QHS  ? apixaban  5 mg Oral BID  ? atorvastatin  40 mg Oral QPM  ? dexamethasone  4 mg Oral Q12H  ? famotidine  20 mg Oral BID  ? insulin aspart  0-6 Units Subcutaneous TID WC  ? insulin aspart  3 Units Subcutaneous BID WC  ? insulin glargine-yfgn  20 Units Subcutaneous QHS  ? levETIRAcetam  500 mg Oral BID  ? sodium chloride flush  3 mL Intravenous Q12H  ? vitamin B-12  1,000 mcg Oral Daily  ? ? ?PRN meds: ?acetaminophen **OR** acetaminophen, albuterol, docusate sodium, polyethylene glycol  ? ?Antimicrobials: ?Anti-infectives (From admission, onward)  ? ? None  ? ?  ? ? ?Objective: ?Vitals:  ? 07/19/21 0755 07/19/21 1544  ?BP: 122/71 108/63  ?Pulse: 92 93  ?Resp: 16 16  ?Temp: 98 ?F (36.7 ?C) 98.4 ?F (36.9 ?C)  ?SpO2: 97% 98%  ? ?No intake or output data in the 24 hours ending 07/19/21 1600 ? ?Filed Weights  ? 07/07/21 0238 07/08/21 0326  ?Weight: (P) 57.6 kg 56.4 kg  ? ?Weight change:  ?Body mass index is 25.99 kg/m?.  ? ?Physical Exam: ?General exam: Pleasant, elderly Caucasian female.  Propped up in bed ?Skin: No rashes, lesions or ulcers. ?HEENT: Atraumatic, normocephalic, no obvious bleeding ?Lungs: Clear to auscultation bilaterally ?CVS: Regular rate and rhythm, no murmur ?GI/Abd soft, nontender, nondistended, bowel sound present ?CNS: Alert, awake, oriented x3 ?Psychiatry: Sad affect ?Extremities: No pedal edema, no calf tenderness ? ?Data Review: I have  personally reviewed the laboratory data and studies available. ? ?F/u labs ordered ?Unresulted Labs (From admission, onward)  ? ?  Start     Ordered  ? 07/20/21 0500  CBC with Differential/Platelet  Tomorrow morning,   R       ? 07/19/21 0937  ? 07/20/21 0500  Comprehensive metabolic panel  Tomorrow morning,   R       ? 07/19/21 0937  ? ?  ?  ? ?  ? ? ?Signed, ?Terrilee Croak, MD ?[facility] ?07/19/2021 ? ? ? ? ? ? ? ? ? ? ? ? ?

## 2021-07-19 NOTE — Progress Notes (Signed)
Mobility Specialist Progress Note  ? ? 07/19/21 1314  ?Mobility  ?Activity Transferred from bed to chair  ?Level of Assistance +2 (takes two people)  ?Assistive Device Stedy  ?Activity Response Tolerated fair  ?$Mobility charge 1 Mobility  ? ?Pt received in bed and agreeable. Able to step and pivot to chair with walker. Once in chair, pt transferred with steady to [facility]. Had successful void. Left in chair with call bell in reach and alarm on.  ? ?Hildred Alamin ?Mobility Specialist  ?  ?

## 2021-07-20 DIAGNOSIS — R531 Weakness: Secondary | ICD-10-CM | POA: Diagnosis not present

## 2021-07-20 LAB — GLUCOSE, CAPILLARY
Glucose-Capillary: 237 mg/dL — ABNORMAL HIGH (ref 70–99)
Glucose-Capillary: 349 mg/dL — ABNORMAL HIGH (ref 70–99)
Glucose-Capillary: 131 mg/dL — ABNORMAL HIGH (ref 70–99)
Glucose-Capillary: 217 mg/dL — ABNORMAL HIGH (ref 70–99)

## 2021-07-20 LAB — CBC WITH DIFFERENTIAL/PLATELET
Abs Immature Granulocytes: 0.91 10*3/uL — ABNORMAL HIGH (ref 0.00–0.07)
Basophils Absolute: 0.1 10*3/uL (ref 0.0–0.1)
Basophils Relative: 0 %
Eosinophils Absolute: 0 10*3/uL (ref 0.0–0.5)
Eosinophils Relative: 0 %
HCT: 33.6 % — ABNORMAL LOW (ref 36.0–46.0)
Hemoglobin: 10.9 g/dL — ABNORMAL LOW (ref 12.0–15.0)
Immature Granulocytes: 7 %
Lymphocytes Relative: 9 %
Lymphs Abs: 1.2 10*3/uL (ref 0.7–4.0)
MCH: 29.1 pg (ref 26.0–34.0)
MCHC: 32.4 g/dL (ref 30.0–36.0)
MCV: 89.8 fL (ref 80.0–100.0)
Monocytes Absolute: 0.9 10*3/uL (ref 0.1–1.0)
Monocytes Relative: 7 %
Neutro Abs: 10.3 10*3/uL — ABNORMAL HIGH (ref 1.7–7.7)
Neutrophils Relative %: 77 %
Platelets: 263 10*3/uL (ref 150–400)
RBC: 3.74 MIL/uL — ABNORMAL LOW (ref 3.87–5.11)
RDW: 15.3 % (ref 11.5–15.5)
Smear Review: ADEQUATE
WBC: 13.3 10*3/uL — ABNORMAL HIGH (ref 4.0–10.5)
nRBC: 0.2 % (ref 0.0–0.2)

## 2021-07-20 LAB — COMPREHENSIVE METABOLIC PANEL
ALT: 38 U/L (ref 0–44)
AST: 23 U/L (ref 15–41)
Albumin: 2.3 g/dL — ABNORMAL LOW (ref 3.5–5.0)
Alkaline Phosphatase: 67 U/L (ref 38–126)
Anion gap: 5 (ref 5–15)
BUN: 32 mg/dL — ABNORMAL HIGH (ref 8–23)
CO2: 26 mmol/L (ref 22–32)
Calcium: 8.7 mg/dL — ABNORMAL LOW (ref 8.9–10.3)
Chloride: 106 mmol/L (ref 98–111)
Creatinine, Ser: 0.94 mg/dL (ref 0.44–1.00)
GFR, Estimated: >60 mL/min (ref >60)
Glucose, Bld: 204 mg/dL — ABNORMAL HIGH (ref 70–99)
Potassium: 4 mmol/L (ref 3.5–5.1)
Sodium: 137 mmol/L (ref 135–145)
Total Bilirubin: 0.5 mg/dL (ref 0.3–1.2)
Total Protein: 4.8 g/dL — ABNORMAL LOW (ref 6.5–8.1)

## 2021-07-20 MED ORDER — FAMOTIDINE 20 MG PO TABS
20.0000 mg | ORAL_TABLET | Freq: Every day | ORAL | Status: DC
  Administered 2021-07-21 – 2021-08-14 (×25): 20 mg via ORAL
  Filled 2021-07-20 (×24): qty 1

## 2021-07-20 NOTE — Progress Notes (Addendum)
Manufacturing engineer [facility]) Hospital Liaison Note  ?  ?Unfortunately, patient is not eligible for [facility] at this time. ACC remains available if needs arise, or if family is interested in transitioning to LTC/Home w. Hospice services, etc.  ? ?Addendum: 1:06p ?MSW spoke with daughter/Kim and inquired of returning home with hospice support per MD, Dr. Pietro Cassis request. Family stated that this would not be an option as family cannot provide 24/7 support in the home. MSW voiced understanding. ACC to remain available if needs arise.  ?  ?Please call with any questions or concerns. Thank you ?  ?Daphene Calamity, MSW ?[place] [facility] Liaison  ?[phone] ?

## 2021-07-20 NOTE — Progress Notes (Signed)
?PROGRESS NOTE ? ?[name]  ?DOB: 1946-03-11  ?PCP: [name], MD ?VOZ:[id number]  ?DOA: 07/07/2021 ? LOS: 10 days  ?Hospital Day: 14 ? ?Brief narrative: ?SHULAMIS WENBERG is a 76 y.o. female with PMH significant for DM2, HTN, PVD, lung cancer with brain mets s/p resection and chemoradiation, DVT/PE on Eliquis. ?Patient was brought to the ED from home on 3/27 from home after she slid out of bed and fell to the floor. ?She was being taken care at home by family members over the years of 42 and their own physical limitations.  Family members were not able to pick her up when she fell and hence EMS was called and she was brought to the ED ?In the ED, CT scan of head (compared to CT scan and MRIs from last 2 months) showed extensive dural based hemorrhagic metastatic disease without significant change in extensive vasogenic edema or midline shift small volume extra-axial hemorrhage along the right frontal convexity with superimposed traumatic hemorrhage. ?Patient was admitted to hospital service ? ?Her hospitalization was prolonged because patient was in a phase of denial of the severity of her condition.  At one point patient tried to leave AMA but she did not have enough family support at home. Psychiatry service was consulted to decide capacity.  ?After a thorough conversation I had with her on 4/8, patient agreed to hospice services.   ?In process ? ?Subjective: ?Patient was seen and examined this morning.   ?Pleasant elderly Caucasian female.  Propped up in bed.  ?Not in distress.  Taking her breakfast.  No family member at bedside.  ? ?Principal Problem: ?  Fall at home secondary to weakness ?Active Problems: ?  Diet-controlled type 2 diabetes mellitus ([place]) ?  Vasogenic brain edema ([place]) ?  Chronic anticoagulation ?  Non-small cell lung cancer metastatic to brain [facility]) with vasogenic brain edema ?  Normocytic anemia ?  Leukocytosis ?  History of DVT (deep vein thrombosis) ?  History of pulmonary  embolism and DVT on chronic anticoagulation ?  ? ?Assessment and Plan: ?Small traumatic subdural hemorrhage ?Non-small cell lung cancer ?Extensive hemorrhagic brain metastasis extensive vasogenic edema ?-Patient presented after a fall at home.   ?-Known history of lung cancer with brain mets s/p resection, chemoradiation and recent scans showing extensive hemorrhagic mets and vasogenic edema  ?-New CT head on admission showed superimposed small traumatic subdural hemorrhage. ?-Neurosurgery was consulted and patient was started on Decadron.  Continue Keppra for seizure prophylaxis ?-Palliative care consultation was obtained.  Patient declined hospice care. ?-After a thorough conversation I had with her on 4/8, patient agreed to hospice services.  ?-Per hospice nurse Lorayne Bender, she does not qualify for residential hospice.  I discussed with her this morning.  She is going to talk to the family about hospice at home. ? ?History of DVT/PE on chronic anticoagulation  ?-PE was diagnosed on 05/08/2021.   ?-Despite hemorrhagic metastasis in brain, patient had chosen to continue Eliquis.  If she takes hospice route, she understands, she would have less benefit and more risk with Eliquis. ?  ?Type 2 diabetes mellitus ?-A1c 6.5 in February 2023 ?-Currently on Lantus 10 nightly and sliding scale insulin with Accu-Cheks. ?Recent Labs  ?Lab 07/19/21 ?1107 07/19/21 ?1622 07/19/21 ?2011 07/20/21 ?0758 07/20/21 ?1121  ?GLUCAP 221* 271* 260* 237* 349*  ? ? ?Goals of care ?  Code Status: DNR  ? ? ?Mobility: Limited mobility ? ?Nutritional status:  ?Body mass index is 25.99 kg/m?.  ?  ?  ? ? ? ? ?  Diet:  ?Diet Order   ? ?       ?  Diet - low sodium heart healthy       ?  ?  Diet regular Room service appropriate? Yes; Fluid consistency: Thin  Diet effective now       ?  ? ?  ?  ? ?  ? ? ?DVT prophylaxis:  ? ?apixaban (ELIQUIS) tablet 5 mg   ?Antimicrobials: None ?Fluid: None ?Consultants: None at this time ?Family Communication: Family  member at bedside ? ?Status is: Inpatient ? ?Continue in-hospital care because: Needs better disposition plan.  Preferably hospice ?Level of care: Telemetry Medical  ? ?Dispo: The patient is from: Home ?             Anticipated d/c is to: Likely home with hospice. ?             Patient currently is not medically stable to d/c. ?  Difficult to place patient No ? ? ? ? ?Infusions:  ? ? ?Scheduled Meds: ? amitriptyline  50 mg Oral QHS  ? apixaban  5 mg Oral BID  ? atorvastatin  40 mg Oral QPM  ? dexamethasone  4 mg Oral Q12H  ? famotidine  20 mg Oral BID  ? insulin aspart  0-6 Units Subcutaneous TID WC  ? insulin aspart  3 Units Subcutaneous BID WC  ? insulin glargine-yfgn  20 Units Subcutaneous QHS  ? levETIRAcetam  500 mg Oral BID  ? sodium chloride flush  3 mL Intravenous Q12H  ? vitamin B-12  1,000 mcg Oral Daily  ? ? ?PRN meds: ?acetaminophen **OR** acetaminophen, albuterol, docusate sodium, polyethylene glycol  ? ?Antimicrobials: ?Anti-infectives (From admission, onward)  ? ? None  ? ?  ? ? ?Objective: ?Vitals:  ? 07/20/21 0630 07/20/21 1117  ?BP: 112/72 (!) 110/56  ?Pulse: 88 (!) 108  ?Resp: 18 18  ?Temp: 98 ?F (36.7 ?C) 98.2 ?F (36.8 ?C)  ?SpO2: 98% 97%  ? ? ?Intake/Output Summary (Last 24 hours) at 07/20/2021 1153 ?Last data filed at 07/20/2021 0630 ?Gross per 24 hour  ?Intake 240 ml  ?Output 1000 ml  ?Net -760 ml  ? ? ?Filed Weights  ? 07/07/21 0238 07/08/21 0326  ?Weight: (P) 57.6 kg 56.4 kg  ? ?Weight change:  ?Body mass index is 25.99 kg/m?.  ? ?Physical Exam: ?General exam: Pleasant, elderly Caucasian female.  Propped up in bed ?Skin: No rashes, lesions or ulcers. ?HEENT: Atraumatic, normocephalic, no obvious bleeding ?Lungs: Clear to auscultation bilaterally ?CVS: Regular rate and rhythm, no murmur ?GI/Abd soft, nontender, nondistended, bowel sound present ?CNS: Alert, awake, oriented x3 ?Psychiatry: Sad affect ?Extremities: No pedal edema, no calf tenderness ? ?Data Review: I have personally reviewed the  laboratory data and studies available. ? ?F/u labs ordered ?Unresulted Labs (From admission, onward)  ? ? None  ? ?  ? ? ?Signed, ?Terrilee Croak, MD ?[facility] ?07/20/2021 ? ? ? ? ? ? ? ? ? ? ? ? ?

## 2021-07-21 DIAGNOSIS — R531 Weakness: Secondary | ICD-10-CM | POA: Diagnosis not present

## 2021-07-21 LAB — GLUCOSE, CAPILLARY
Glucose-Capillary: 150 mg/dL — ABNORMAL HIGH (ref 70–99)
Glucose-Capillary: 314 mg/dL — ABNORMAL HIGH (ref 70–99)
Glucose-Capillary: 327 mg/dL — ABNORMAL HIGH (ref 70–99)
Glucose-Capillary: 179 mg/dL — ABNORMAL HIGH (ref 70–99)
Glucose-Capillary: 157 mg/dL — ABNORMAL HIGH (ref 70–99)

## 2021-07-21 MED ORDER — INSULIN ASPART 100 UNIT/ML IJ SOLN
0.0000 [IU] | Freq: Three times a day (TID) | INTRAMUSCULAR | Status: DC
  Administered 2021-07-21: 11 [IU] via SUBCUTANEOUS
  Administered 2021-07-22: 2 [IU] via SUBCUTANEOUS
  Administered 2021-07-22 (×2): 3 [IU] via SUBCUTANEOUS
  Administered 2021-07-23: 8 [IU] via SUBCUTANEOUS
  Administered 2021-07-23: 2 [IU] via SUBCUTANEOUS
  Administered 2021-07-23: 8 [IU] via SUBCUTANEOUS
  Administered 2021-07-24: 3 [IU] via SUBCUTANEOUS
  Administered 2021-07-24: 15 [IU] via SUBCUTANEOUS
  Administered 2021-07-25: 8 [IU] via SUBCUTANEOUS
  Administered 2021-07-25: 5 [IU] via SUBCUTANEOUS
  Administered 2021-07-25: 11 [IU] via SUBCUTANEOUS
  Administered 2021-07-26: 5 [IU] via SUBCUTANEOUS
  Administered 2021-07-26: 2 [IU] via SUBCUTANEOUS
  Administered 2021-07-26: 5 [IU] via SUBCUTANEOUS
  Administered 2021-07-27: 8 [IU] via SUBCUTANEOUS
  Administered 2021-07-28: 15 [IU] via SUBCUTANEOUS
  Administered 2021-07-28 (×2): 3 [IU] via SUBCUTANEOUS
  Administered 2021-07-29: 11 [IU] via SUBCUTANEOUS
  Administered 2021-07-29 – 2021-07-30 (×3): 3 [IU] via SUBCUTANEOUS
  Administered 2021-07-30: 8 [IU] via SUBCUTANEOUS
  Administered 2021-07-31: 2 [IU] via SUBCUTANEOUS
  Administered 2021-07-31: 5 [IU] via SUBCUTANEOUS
  Administered 2021-07-31: 8 [IU] via SUBCUTANEOUS
  Administered 2021-08-01: 11 [IU] via SUBCUTANEOUS
  Administered 2021-08-01: 3 [IU] via SUBCUTANEOUS
  Administered 2021-08-02: 2 [IU] via SUBCUTANEOUS
  Administered 2021-08-02: 11 [IU] via SUBCUTANEOUS
  Administered 2021-08-02: 3 [IU] via SUBCUTANEOUS
  Administered 2021-08-03: 8 [IU] via SUBCUTANEOUS
  Administered 2021-08-03 – 2021-08-04 (×3): 3 [IU] via SUBCUTANEOUS
  Administered 2021-08-04: 5 [IU] via SUBCUTANEOUS
  Administered 2021-08-05: 16 [IU] via SUBCUTANEOUS
  Administered 2021-08-06: 2 [IU] via SUBCUTANEOUS
  Administered 2021-08-06: 8 [IU] via SUBCUTANEOUS
  Administered 2021-08-06: 2 [IU] via SUBCUTANEOUS
  Administered 2021-08-07: 8 [IU] via SUBCUTANEOUS
  Administered 2021-08-07: 5 [IU] via SUBCUTANEOUS
  Administered 2021-08-08: 15 [IU] via SUBCUTANEOUS
  Administered 2021-08-08: 5 [IU] via SUBCUTANEOUS
  Administered 2021-08-09: 15 [IU] via SUBCUTANEOUS
  Administered 2021-08-10: 2 [IU] via SUBCUTANEOUS
  Administered 2021-08-10: 15 [IU] via SUBCUTANEOUS
  Administered 2021-08-11: 3 [IU] via SUBCUTANEOUS
  Administered 2021-08-11: 2 [IU] via SUBCUTANEOUS
  Administered 2021-08-12: 8 [IU] via SUBCUTANEOUS
  Administered 2021-08-12: 3 [IU] via SUBCUTANEOUS
  Administered 2021-08-12: 2 [IU] via SUBCUTANEOUS
  Administered 2021-08-13: 3 [IU] via SUBCUTANEOUS
  Administered 2021-08-14: 8 [IU] via SUBCUTANEOUS

## 2021-07-21 NOTE — TOC Progression Note (Addendum)
Transition of Care (TOC) - Progression Note  ? ? ?Patient Details  ?Name: Katelyn Kennedy ?MRN: [id number] ?Date of Birth: [birth year] ? ?Transition of Care (TOC) CM/SW Contact  ?Curlene Labrum, RN ?Phone Number: ?07/21/2021, 9:45 AM ? ?Clinical Narrative:    ?CM called and left a voicemail message with the patient's daughter, Milessa Hogan, on the phone to discuss transitions of care with the patient's family.  [place] facility is unable to offer a bed to the patient at this time. ? ?07/21/2021 1000 - [place] is unable to offer services for Inpatient Hospice placement at this time.  I called and spoke with Vito Backers, CM with [facility] center and placed a referral for review of clinicals to see if the facility was able to offer care for Palliative Care / Hospice care support.  I spoke with Lissa Merlin, NP and she spoke with Dr. Ronne Binning at the facility and at this time - the facility is unable to provide a bed offer for hospice care as well. ? ?The patient's sister, Chucky May, called and I spoke with her regarding the patient's need for LTC placement at this time since she is not a candidate for Inpatient Hospice placement.  Hasty, sister states that she spoke with [facility] facility and understands that a bed is not available at this time.  Hasty states that the family is not able to provide 24 hour care and support at the home and that the patient will be unable to discharge to home with hospice services since the patient's family members are unable to provide 24 hour supervision. ? ?CM and MSW with DTP Team will continue to follow the patient and communicate with the patient's family for discharge needs - patient lacks capacity to make informed medical decisions. ? ? ?Expected Discharge Plan: [place] ?Barriers to Discharge: Continued Medical Work up, Ship broker, Family Issues ? ?Expected Discharge Plan and Services ?Expected  Discharge Plan: [place] ?  ?  ?  ?Living arrangements for the past 2 months: [place] ?Expected Discharge Date: 07/09/21               ?  ?  ?  ?  ?  ?  ?  ?  ?  ?  ? ? ?Social Determinants of Health (SDOH) Interventions ?  ? ?Readmission Risk Interventions ? ?  07/18/2021  ? 12:39 PM 03/19/2021  ?  2:43 PM 11/08/2020  ?  3:25 PM  ?Readmission Risk Prevention Plan  ?Post Dischage Appt   Complete  ?Medication Screening   Complete  ?Transportation Screening Complete Complete Complete  ?PCP or Specialist Appt within 3-5 Days  Complete   ?[place] or Home Care Consult  Complete   ?Social Work Consult for [place] Planning/Counseling  Complete   ?Palliative Care Screening  Not Applicable   ?Medication Review Press photographer) Complete Complete   ?PCP or Specialist appointment within 3-5 days of discharge Complete    ?[place] or Home Care Consult Complete    ?SW Recovery Care/Counseling Consult Complete    ?Palliative Care Screening Complete    ?Skilled Nursing Facility Complete    ? ? ?

## 2021-07-21 NOTE — Progress Notes (Signed)
Mobility Specialist Progress Note: ? ? 07/21/21 1010  ?Mobility  ?Activity Transferred from bed to chair  ?Level of Assistance Contact guard assist, steadying assist  ?Assistive Device Stedy  ?Activity Response Tolerated well  ?$Mobility charge 1 Mobility  ? ?Pt received in bed willing to participate in mobility. No complaints of pain. Pt able to stand from EOB into stedy without any assistance. Left in chair with call bell in reach and all needs met.  ? ?Katelyn Kennedy ?Mobility Specialist ?Primary Phone [phone] ? ?

## 2021-07-21 NOTE — Progress Notes (Signed)
Physical Therapy Treatment ?Patient Details ?Name: Katelyn Kennedy ?MRN: [id number] ?DOB: September 20, 1945 ?Today's Date: 07/21/2021 ? ? ?History of Present Illness Pt is a 76 y.o. female admitted 07/07/21 after fall out of bed with reports of worsening L-side weakness related to brain metastases with prior resection. Head CT showing continued presence of brain mets with vasogenic edema and leftward shift of the brain. 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 ? ?  ?PT Comments  ? ? Pt admitted with above diagnosis. Pt was able to ambulate a few steps with RW today but needs mod assist to 2 as pt took a few steps with flexed posture and then pt quickly turned to the chair and sat down needing assist to control descent. Pt confused throughout stating she is waiting for breakfast.  Informed pt that it was 3 pm.  Pt unaware of situation or time. Pt reoriented but remained confused. Will continue PT as able.  Pt currently with functional limitations due to balance and endurance  deficits. Pt will benefit from skilled PT to increase their independence and safety with mobility to allow discharge to the venue listed below.      ?Recommendations for follow up therapy are one component of a multi-disciplinary discharge planning process, led by the attending physician.  Recommendations may be updated based on patient status, additional functional criteria and insurance authorization. ? ?Follow Up Recommendations ? Skilled nursing-short term rehab (<3 hours/day) ?  ?  ?Assistance Recommended at Discharge Frequent or constant Supervision/Assistance  ?Patient can return home with the following A lot of help with bathing/dressing/bathroom;Assistance with cooking/housework;Direct supervision/assist for financial management;Direct supervision/assist for medications management;Assist for transportation;Help with stairs or ramp for entrance;Two people to help  with walking and/or transfers ?  ?Equipment Recommendations ? Wheelchair (measurements PT);Wheelchair cushion (measurements PT);Hospital bed;Other (comment) (lift equipment)  ?  ?Recommendations for Other Services   ? ? ?  ?Precautions / Restrictions Precautions ?Precautions: Fall;Other (comment) ?Precaution Comments: h/o lung CA with brain mets (s/p resection R posterior frontal dural metastasis) with L-side weakness and inattention ?Restrictions ?Weight Bearing Restrictions: No  ?  ? ?Mobility ? Bed Mobility ?Overal bed mobility: Needs Assistance ?Bed Mobility: Sit to Supine ?  ?  ?Supine to sit: Max assist, HOB elevated, Mod assist ?  ?  ?General bed mobility comments: Pt needed max assist intiially and then mod assist to complete movement to EOB. ?  ? ?Transfers ?Overall transfer level: Needs assistance ?Equipment used: Rolling walker (2 wheels) ?Transfers: Sit to/from Stand ?Sit to Stand: Mod assist, +2 physical assistance ?Stand pivot transfers: +2 physical assistance, Mod assist ?  ?  ?  ?  ?General transfer comment: min to mod A given to boost and cues required.  Had to stand x 2 for pt to get upright. ?  ? ?Ambulation/Gait ?Ambulation/Gait assistance: Mod assist, +2 physical assistance ?Gait Distance (Feet): 4 Feet ?Assistive device: Rolling walker (2 wheels) ?Gait Pattern/deviations: Shuffle, Decreased step length - right, Decreased step length - left, Decreased stride length, Trunk flexed, Wide base of support ?  ?Gait velocity interpretation: <1.31 ft/sec, indicative of household ambulator ?  ?General Gait Details: Slow and mildly unsteady. RW for support. Pt with overall low floor clearance shuffling at times. VC's throughout for forward gaze.  Needed incr cues to clear left foot more than right foot.  Pt only able to take a few steps forward and then turned unexpectedly toward the chair  and proceeded to sit.  Had to assist with descent to chair. ? ? ?Stairs ?  ?  ?  ?  ?  ? ? ?Wheelchair Mobility ?   ? ?Modified Rankin (Stroke Patients Only) ?  ? ? ?  ?Balance Overall balance assessment: Needs assistance ?Sitting-balance support: Feet supported ?Sitting balance-Leahy Scale: Fair ?Sitting balance - Comments: min guard assist to maintain sitting balance. ?  ?Standing balance support: During functional activity, Bilateral upper extremity supported ?Standing balance-Leahy Scale: Poor ?Standing balance comment: Pt requires +2 mod assist to stand with RW. ?  ?  ?  ?  ?  ?  ?  ?  ?  ?  ?  ?  ? ?  ?Cognition Arousal/Alertness: Awake/alert ?Behavior During Therapy: Flat affect ?Overall Cognitive Status: No family/caregiver present to determine baseline cognitive functioning ?Area of Impairment: Orientation, Attention, Memory, Following commands, Safety/judgement, Awareness, Problem solving ?  ?  ?  ?  ?  ?  ?  ?  ?Orientation Level: Disoriented to, Time ?Current Attention Level: Focused ?Memory: Decreased short-term memory ?Following Commands: Follows one step commands with increased time, Follows one step commands inconsistently ?Safety/Judgement: Decreased awareness of safety, Decreased awareness of deficits ?Awareness: Intellectual ?Problem Solving: Slow processing, Requires verbal cues, Decreased initiation ?General Comments: Pt following most functional cues with cues for attention. Pt is tangential and easily distracted. Slow processing and limited problem solving noted ?  ?  ? ?  ?Exercises General Exercises - Lower Extremity ?Ankle Circles/Pumps: AROM, Both, 10 reps, Seated ?Long Arc Quad: AROM, Both, 10 reps, Seated ? ?  ?General Comments General comments (skin integrity, edema, etc.): VSS ?  ?  ? ?Pertinent Vitals/Pain Pain Assessment ?Pain Assessment: No/denies pain ?Breathing: normal ?Negative Vocalization: none ?Facial Expression: smiling or inexpressive ?Body Language: relaxed ?Consolability: no need to console ?PAINAD Score: 0  ? ? ?Home Living   ?  ?  ?  ?  ?  ?  ?  ?  ?  ?   ?  ?Prior Function    ?  ?   ?   ? ?PT Goals (current goals can now be found in the care plan section) Acute Rehab PT Goals ?Patient Stated Goal: to go home ?Progress towards PT goals: Progressing toward goals ? ?  ?Frequency ? ? ? Min 2X/week ? ? ? ?  ?PT Plan Current plan remains appropriate  ? ? ?Co-evaluation   ?  ?  ?  ?  ? ?  ?AM-PAC PT "6 Clicks" Mobility   ?Outcome Measure ? Help needed turning from your back to your side while in a flat bed without using bedrails?: A Lot ?Help needed moving from lying on your back to sitting on the side of a flat bed without using bedrails?: A Lot ?Help needed moving to and from a bed to a chair (including a wheelchair)?: Total ?Help needed standing up from a chair using your arms (e.g., wheelchair or bedside chair)?: Total ?Help needed to walk in hospital room?: Total ?Help needed climbing 3-5 steps with a railing? : Total ?6 Click Score: 8 ? ?  ?End of Session Equipment Utilized During Treatment: Gait belt ?Activity Tolerance: Patient tolerated treatment well ?Patient left: with call bell/phone within reach;in chair;with chair alarm set ?Nurse Communication: Mobility status ?PT Visit Diagnosis: Unsteadiness on feet (R26.81);Muscle weakness (generalized) (M62.81);Repeated falls (R29.6);History of falling (Z91.81) ?  ? ? ?Time: 0347-4259 ?PT Time Calculation (min) (ACUTE ONLY): 16 min ? ?Charges:  $Gait Training: 8-22 mins          ?          ? ?  Merion Caton M,PT ?Acute Rehab Services ?[phone] ?[phone] (pager)  ? ? ?Kasiya Burck F [place] ?07/21/2021, 4:23 PM ? ?

## 2021-07-21 NOTE — Progress Notes (Signed)
[facility] ?PROGRESS NOTE ? ?Katelyn Kennedy QVZ:[id number] DOB: 1946/03/18 DOA: 07/07/2021 ?PCP: [name], MD ? ?Status: ?Remains inpatient appropriate because:  ?Unsafe discharge plan-patient continues to require 2+ assist for any mobility.  Patient was living with elderly aunts prior to admission and would need to have significant improvement in mobility before can return to that home setting ?Medicaid has been applied for.  Patient's retirement funding is not enough to pay for SNF alone.  Patient does not meet qualifications for rehab so insurance has denied. ? ?Barriers to discharge: ?Social: ?Family at this time unable to provide care given patient's significant debility ? ?Clinical: ?Continued debility ?Patient has been deemed to lack capacity by the psychiatric team ? ?Level of care:  ?Telemetry Medical ? ? ?Code Status: DNR ?Family Communication: Patient only ?DVT prophylaxis: Eliquis ?COVID vaccination status: Pfizer 08/11/2019, 10/11/2019 ? ?HPI: ? 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  Right frontal mass was diagnosed in July 2022, last chemoradiation was 03/14/2021.  CT on 12/19 revealed multiple intracranial metastatic lesions with some hemorrhagic formation.  Patient underwent staging CT of the chest on 12/19 on 1/26 that revealed PE as well as additional nodules.  Oncologist spoke with neurosurgeon who cleared patient for IV heparin during that hospitalization.  DVT/PE on Eliquis and DM 2 was admitted 07/07/2021 after fall.  Work-up revealed that fall was likely secondary to left-sided weakness from her brain mets.  She had recently been tapered off Decadron.  Patient was restarted on Decadron.  Work-up including CT of head noted extensive dural based hemorrhagic metastatic disease. ? ?Subjective: ?Pleasant.  Sitting up in bed eating breakfast with her hands.  Discussed discharge disposition and patient was having some  difficulty navigating the subject.  She mentioned that her family thought she would be discharging earlier and they did not understand why she was still here but that they understood she was having some difficulty getting out of bed alone. ? ?Objective: ?Vitals:  ? 07/21/21 3295 07/21/21 0803  ?BP: (!) 103/49 111/68  ?Pulse: 90 89  ?Resp: 18 19  ?Temp: 97.7 ?F (36.5 ?C) 97.6 ?F (36.4 ?C)  ?SpO2: 98% 98%  ? ? ?Intake/Output Summary (Last 24 hours) at 07/21/2021 1123 ?Last data filed at 07/21/2021 1884 ?Gross per 24 hour  ?Intake 240 ml  ?Output 900 ml  ?Net -660 ml  ? ?Filed Weights  ? 07/07/21 0238 07/08/21 0326  ?Weight: (P) 57.6 kg 56.4 kg  ? ? ?Exam: ? ?Constitutional: NAD, calm, comfortable ?Respiratory: clear to auscultation bilaterally, no wheezing, no crackles. Normal respiratory effort. No accessory muscle use.  ?Cardiovascular: Regular rate and rhythm, no murmurs / rubs / gallops. No extremity edema.  ?Abdomen: no tenderness, no masses palpated. No hepatosplenomegaly. Bowel sounds positive. LBM 4/7 ?Neurologic: Cranial nerves II through XII appear to be grossly intact.  Moving all extremities equally but weakly with generalized strength 3+-4/5.  Sensation intact ?Psychological: Alert and oriented x 3. Normal mood.  ? ? ?Assessment/Plan: ?* Fall at home secondary to weakness and known metastatic lesion ?-Patient presents after having a fall at home with reports of worsening weakness.  She had just recently been tapered off of Decadron.   ?-Secondary to metastatic cancer and associated vasogenic edema ?-Continue PT and OT ?-Transitions of care consulted -assisting with placement-continues to require significant assistance with mobility.  Patient would have to have marked improvement in her mobility before she could be discharged home with  2 elderly aunts ?-TOC/servant Center have assisted with Medicaid application and awaiting for a number.  Once number received can proceed with placement ?-4/10 I spoke with  the physician director of [facility].  Updated on status.  Currently she is not a candidate since her prognosis and life span is greater than 6 weeks.  It is understandable that over time this could rapidly progress and recommends we contact them once her status worsens. ?-Upon review of recent encounters patient has been hospitalized 5 times including this admission since November 2022 ? ? ?A physical therapy consult is indicated based on the patient?s mobility assessment. ?Mobility Assessment (last 72 hours)   ? ? Mobility Assessment   ? ? [place] Name 07/20/21 2250 07/20/21 0800 07/19/21 2200 07/19/21 0800 07/19/21 0357  ? Does patient have an order for bedrest or is patient medically unstable No - Continue assessment No - Continue assessment No - Continue assessment No - Continue assessment No - Continue assessment  ? What is the highest level of mobility based on the progressive mobility assessment? Level 3 (Stands with assist) - Balance while standing  and cannot march in place Level 3 (Stands with assist) - Balance while standing  and cannot march in place Level 3 (Stands with assist) - Balance while standing  and cannot march in place Level 3 (Stands with assist) - Balance while standing  and cannot march in place Level 3 (Stands with assist) - Balance while standing  and cannot march in place  ? Is the above level different from baseline mobility prior to current illness? Yes - Recommend PT order Yes - Recommend PT order Yes - Recommend PT order Yes - Recommend PT order Yes - Recommend PT order  ? ? [place] Name 07/18/21 2145  ?  ?  ?  ?  ? Does patient have an order for bedrest or is patient medically unstable No - Continue assessment      ? What is the highest level of mobility based on the progressive mobility assessment? Level 3 (Stands with assist) - Balance while standing  and cannot march in place      ? ?  ?  ? ?  ? 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 ?-Continue Eliquis  ?  ?History of DVT (deep vein thrombosis) ?On Eliquis ?  ?Leukocytosis ?-Secondary to stress demargination from steroid ?  ?Normocytic anemia ?Chronic.  Hemoglobin 11.1 g/dL which appears near patient's baseline ?-Continue to monitor ?  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   ?-Continue oral Decadron as recommended by neurology and neurosurgical team ?-Palliative care consulted following-patient declined adjuvant and hospice ?-Due to metastatic cancer to the brain patient has developed cognitive impairment and has been evaluated by the psychiatric team and no longer lacks capacity ?  ?Vasogenic brain edema  ?Continue Decadron per neurosurgery recommendation ?-Currently on Decadron and Keppra ?  ?Diet-controlled type 2 diabetes mellitus ([place]) ?Stable CBGs ? Last hemoglobin A1c 6.5 on 06/04/2021.   ?-Continue Lantus 10 units nightly ?-Continue SSI ?  ? ? ? ?Data Reviewed: ?Basic Metabolic Panel: ?Recent Labs  ?Lab 07/20/21 ?0227  ?NA 137  ?  K 4.0  ?CL 106  ?CO2 26  ?GLUCOSE 204*  ?BUN 32*  ?CREATININE 0.94  ?CALCIUM 8.7*  ? ?Liver Function Tests: ?Recent Labs  ?Lab 07/20/21 ?0227  ?AST 23  ?ALT 38  ?ALKPHOS 67  ?BILITOT 0.5  ?PROT 4.8*  ?ALBUMIN 2.3*  ? ?No results for input(s): LIPASE, AMYLASE in the last 168 hours. ?No results for input(s): AMMONIA in the last 168 hours. ?CBC: ?Recent Labs  ?Lab 07/20/21 ?0227  ?WBC 13.3*  ?NEUTROABS 10.3*  ?HGB 10.9*  ?HCT 33.6*  ?MCV 89.8  ?PLT 263  ? ?Cardiac Enzymes: ?No results for input(s): CKTOTAL, CKMB, CKMBINDEX, TROPONINI in the last 168 hours. ?BNP (last 3 results) ?No results for input(s):  BNP in the last 8760 hours. ? ?ProBNP (last 3 results) ?No results for input(s): PROBNP in the last 8760 hours. ? ?CBG: ?Recent Labs  ?Lab 07/20/21 ?0758 07/20/21 ?1121 07/20/21 ?1551 07/20/21 ?2104 04/

## 2021-07-22 DIAGNOSIS — R531 Weakness: Secondary | ICD-10-CM | POA: Diagnosis not present

## 2021-07-22 LAB — GLUCOSE, CAPILLARY
Glucose-Capillary: 151 mg/dL — ABNORMAL HIGH (ref 70–99)
Glucose-Capillary: 155 mg/dL — ABNORMAL HIGH (ref 70–99)
Glucose-Capillary: 147 mg/dL — ABNORMAL HIGH (ref 70–99)
Glucose-Capillary: 181 mg/dL — ABNORMAL HIGH (ref 70–99)

## 2021-07-22 MED ORDER — INSULIN ASPART 100 UNIT/ML IJ SOLN
5.0000 [IU] | Freq: Three times a day (TID) | INTRAMUSCULAR | Status: DC
  Administered 2021-07-22 – 2021-08-14 (×63): 5 [IU] via SUBCUTANEOUS

## 2021-07-22 NOTE — Progress Notes (Signed)
Mobility Specialist Progress Note: ? ? 07/22/21 1145  ?Mobility  ?Activity Ambulated with assistance in room;Transferred from bed to chair  ?Level of Assistance Minimal assist, patient does 75% or more  ?Assistive Device Front wheel walker  ?Distance Ambulated (ft) 4 ft  ?Activity Response Tolerated well  ?$Mobility charge 1 Mobility  ? ?Pt received in bed willing to participate in mobility. No complaints of pain. MinA to get to EOB but then contact guard to step to the chair. Pt required lots of cues to move left foot. Left in chair with call bell in reach and all needs met.  ? ?Katelyn Kennedy ?Mobility Specialist ?Primary Phone [phone] ? ?

## 2021-07-22 NOTE — Progress Notes (Signed)
[facility] ?PROGRESS NOTE ? ?Katelyn Kennedy JSE:[id number] DOB: [birth year] DOA: 07/07/2021 ?PCP: [name], MD ? ?Status: ?Remains inpatient appropriate because:  ?Unsafe discharge plan-patient continues to require 2+ assist for any mobility.  Patient was living with elderly aunts prior to admission and would need to have significant improvement in mobility before can return to that home setting ?Medicaid has been applied for.  Patient's retirement funding is not enough to pay for SNF alone.  Patient does not meet qualifications for rehab so insurance has denied. ? ?Barriers to discharge: ?Social: ?Family at this time unable to provide care given patient's significant debility ? ?Clinical: ?Continued debility ?Patient has been deemed to lack capacity by the psychiatric team ? ?Level of care:  ?Telemetry Medical ? ? ?Code Status: DNR ?Family Communication: Patient only ?DVT prophylaxis: Eliquis ?COVID vaccination status: Pfizer 08/11/2019, 10/11/2019 ? ?HPI: ? 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  Right frontal mass was diagnosed in July 2022, last chemoradiation was 03/14/2021.  CT on 12/19 revealed multiple intracranial metastatic lesions with some hemorrhagic formation.  Patient underwent staging CT of the chest on 12/19 on 1/26 that revealed PE as well as additional nodules.  Oncologist spoke with neurosurgeon who cleared patient for IV heparin during that hospitalization.  DVT/PE on Eliquis and DM 2 was admitted 07/07/2021 after fall.  Work-up revealed that fall was likely secondary to left-sided weakness from her brain mets.  She had recently been tapered off Decadron.  Patient was restarted on Decadron.  Work-up including CT of head noted extensive dural based hemorrhagic metastatic disease. ? ?Subjective: ?Awake sitting up in bed.  Nurse administering oral medications.  Patient without specific  complaints. ? ?Objective: ?Vitals:  ? 07/21/21 2049 07/22/21 0500  ?BP: 102/64 (!) 108/59  ?Pulse: (!) 109 83  ?Resp: 18 18  ?Temp: 98.2 ?F (36.8 ?C) (!) 97.5 ?F (36.4 ?C)  ?SpO2: 98% 100%  ? ? ?Intake/Output Summary (Last 24 hours) at 07/22/2021 0834 ?Last data filed at 07/22/2021 0502 ?Gross per 24 hour  ?Intake 720 ml  ?Output 1650 ml  ?Net -930 ml  ? ?Filed Weights  ? 07/07/21 0238 07/08/21 0326  ?Weight: (P) 57.6 kg 56.4 kg  ? ? ?Exam: ? ?Constitutional: NAD, calm, comfortable ?Respiratory: clear to auscultation bilaterally, no wheezing, no crackles. Normal respiratory effort. No accessory muscle use.  ?Cardiovascular: Regular pulse, no extremity edema.  S1-S2, normotensive ?Abdomen: no tenderness, abdomen soft, bowel sounds positive. LBM 4/7 ?Neurologic: Cranial nerves II through XII appear to be grossly intact.  Moving all extremities equally but weakly with generalized strength 3+-4/5.  Sensation intact ?Psychological: Alert and oriented x 3. Normal mood.  ? ? ?Assessment/Plan: ?* Fall at home secondary to weakness and known metastatic lesion ?-Patient presents after having a fall at home with reports of worsening weakness.  She had just recently been tapered off of Decadron.   ?-Secondary to metastatic cancer and associated vasogenic edema ?-Continue PT and OT ?-Transitions of care consulted -assisting with placement-continues to require significant assistance with mobility.  Patient would have to have marked improvement in her mobility before she could be discharged home with 2 elderly aunts ?-[facility] have assisted with Medicaid application and awaiting for a number.  Once number received can proceed with placement ?-4/10 I spoke with the physician director of [facility].  Updated on status.  Currently she is not a candidate since her prognosis and life span is  greater than 6 weeks.  It is understandable that over time this could rapidly progress and recommends we contact them once  her status worsens. ?-Upon review of recent encounters patient has been hospitalized 5 times including this admission since November 2022 ? ? ?A physical therapy consult is indicated based on the patient?s mobility assessment. ?Mobility Assessment (last 72 hours)   ? ? Mobility Assessment   ? ? [place] Name 07/22/21 0758 07/21/21 2300 07/21/21 1600 07/21/21 0800 07/20/21 2250  ? Does patient have an order for bedrest or is patient medically unstable No - Continue assessment No - Continue assessment -- No - Continue assessment No - Continue assessment  ? What is the highest level of mobility based on the progressive mobility assessment? Level 3 (Stands with assist) - Balance while standing  and cannot march in place Level 3 (Stands with assist) - Balance while standing  and cannot march in place Level 3 (Stands with assist) - Balance while standing  and cannot march in place Level 3 (Stands with assist) - Balance while standing  and cannot march in place Level 3 (Stands with assist) - Balance while standing  and cannot march in place  ? Is the above level different from baseline mobility prior to current illness? -- Yes - Recommend PT order -- Yes - Recommend PT order Yes - Recommend PT order  ? ? [place] Name 07/20/21 0800 07/19/21 2200  ?  ?  ?  ? Does patient have an order for bedrest or is patient medically unstable No - Continue assessment No - Continue assessment     ? What is the highest level of mobility based on the progressive mobility assessment? Level 3 (Stands with assist) - Balance while standing  and cannot march in place Level 3 (Stands with assist) - Balance while standing  and cannot march in place     ? Is the above level different from baseline mobility prior to current illness? Yes - Recommend PT order Yes - Recommend PT order     ? ?  ?  ? ?  ? 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 ?-Continue Eliquis  ?  ?History of DVT (deep vein thrombosis) ?On Eliquis ?  ?Leukocytosis ?-Secondary to stress demargination from steroid ?  ?Normocytic anemia ?Chronic.  Hemoglobin 11.1 g/dL which appears near patient's baseline ?-Continue to monitor ?  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   ?-Continue oral Decadron as recommended by neurology and neurosurgical team ?-Palliative care consulted following-patient declined adjuvant and hospice ?-Due to metastatic cancer to the brain patient has developed cognitive impairment and has been evaluated by the psychiatric team and no longer lacks capacity ?  ?Vasogenic brain edema  ?Continue Decadron per neurosurgery recommendation ?-Currently on Decadron and Keppra ?  ?Diet-controlled type 2 diabetes mellitus ([place]) ?Stable CBGs ? Last hemoglobin A1c 6.5 on 06/04/2021.   ?-Continue Lantus 10 units nightly ?-Continue SSI ?  ? ? ? ?Data Reviewed: ?Basic Metabolic Panel: ?Recent Labs  ?Lab 07/20/21 ?0227  ?NA 137  ?K 4.0  ?CL 106  ?CO2 26  ?GLUCOSE 204*  ?BUN 32*  ?CREATININE 0.94  ?CALCIUM  8.7*  ? ?Liver Function Tests: ?Recent Labs  ?Lab 07/20/21 ?0227  ?AST 23  ?ALT 38  ?ALKPHOS 67  ?BILITOT 0.5  ?PROT 4.8*  ?ALBUMIN 2.3*  ? ?No results for input(s): LIPASE, AMYLASE in the last 168 hours. ?No results for input(s): AMMONIA in the last 168 hours. ?CBC: ?Recent Labs  ?Lab 07/20/21 ?0227  ?WBC 13.3*  ?NEUTROABS 10.3*  ?HGB 10.9*  ?HCT 33.6*  ?MCV 89.8  ?PLT 263  ? ?Cardiac Enzymes: ?No results for input(s): CKTOTAL, CKMB, CKMBINDEX, TROPONINI in the last 168 hours. ?BNP (last 3 results) ?No results for input(s): BNP in the last 8760 hours. ? ?ProBNP (last 3 results) ?No results for input(s):  PROBNP in the last 8760 hours. ? ?CBG: ?Recent Labs  ?Lab 07/21/21 ?1202 07/21/21 ?1815 07/21/21 ?2057 07/21/21 ?2201 07/22/21 ?0755  ?GLUCAP 314* 327* 179* 157* 151*  ? ? ?No results found for this or any previous visit (from the past

## 2021-07-22 NOTE — Progress Notes (Signed)
Occupational Therapy Treatment ?Patient Details ?Name: Katelyn Kennedy ?MRN: [id number] ?DOB: 12/27/1945 ?Today's Date: 07/22/2021 ? ? ?History of present illness Pt is a 76 y.o. female admitted 07/07/21 after fall out of bed with reports of worsening L-side weakness related to brain metastases with prior resection. Head CT showing continued presence of brain mets with vasogenic edema and leftward shift of the brain. 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 ?  ?OT comments ? Pt assisted to EOB with min assist. Transferred to [facility] and back to bed with RW, min assist and step by step cues. Pt completed grooming at EOB. Mod assist to return to supine. Pt continues to demonstrate significant cognitive impairment. Awaiting funding for SNF placement for custodial care. Decreased frequency to 1x/week.   ? ?Recommendations for follow up therapy are one component of a multi-disciplinary discharge planning process, led by the attending physician.  Recommendations may be updated based on patient status, additional functional criteria and insurance authorization. ?   ?Follow Up Recommendations ? Skilled nursing-short term rehab (<3 hours/day)  ?  ?Assistance Recommended at Discharge Frequent or constant Supervision/Assistance  ?Patient can return home with the following ? Two people to help with walking and/or transfers;A lot of help with bathing/dressing/bathroom;Assistance with cooking/housework;Assistance with feeding;Direct supervision/assist for medications management;Direct supervision/assist for financial management;Assist for transportation;Help with stairs or ramp for entrance ?  ?Equipment Recommendations ? Other (comment) (defer to next venue)  ?  ?Recommendations for Other Services   ? ?  ?Precautions / Restrictions Precautions ?Precautions: Fall;Other (comment) ?Precaution Comments: L side weakness and inattention  ? ? ?  ? ?Mobility  Bed Mobility ?Overal bed mobility: Needs Assistance ?Bed Mobility: Supine to Sit, Sit to Supine ?  ?  ?Supine to sit: Min assist ?Sit to supine: Mod assist ?  ?General bed mobility comments: assist to raise trunk and for LEs back into bed ?  ? ?Transfers ?Overall transfer level: Needs assistance ?Equipment used: Rolling walker (2 wheels) ?Transfers: Sit to/from Stand, Bed to chair/wheelchair/BSC ?Sit to Stand: Min assist ?Stand pivot transfers: Min assist ?  ?  ?  ?  ?General transfer comment: step by step cues, particularly to advance L LE ?  ?  ?Balance Overall balance assessment: Needs assistance ?  ?Sitting balance-Leahy Scale: Fair ?  ?  ?Standing balance support: Bilateral upper extremity supported ?Standing balance-Leahy Scale: Poor ?  ?  ?  ?  ?  ?  ?  ?  ?  ?  ?  ?  ?   ? ?ADL either performed or assessed with clinical judgement  ? ?ADL Overall ADL's : Needs assistance/impaired ?  ?  ?Grooming: Wash/dry hands;Sitting;Min guard ?  ?  ?  ?  ?  ?  ?  ?  ?  ?Toilet Transfer: Minimal assistance;Rolling walker (2 wheels);BSC/3in1 ?  ?Toileting- Clothing Manipulation and Hygiene: Total assistance;Sit to/from stand ?  ?  ?  ?  ?  ?  ? ?Extremity/Trunk Assessment   ?  ?  ?  ?  ?  ? ?Vision   ?  ?  ?Perception   ?  ?Praxis   ?  ? ?Cognition Arousal/Alertness: Awake/alert ?Behavior During Therapy: Flat affect ?Overall Cognitive Status: Impaired/Different from baseline ?Area of Impairment: Orientation, Attention, Memory, Following commands, Safety/judgement, Awareness, Problem solving ?  ?  ?  ?  ?  ?  ?  ?  ?Orientation Level: Disoriented to, Time, Situation ?  Current Attention Level: Sustained ?Memory: Decreased short-term memory ?Following Commands: Follows one step commands with increased time, Follows one step commands inconsistently ?Safety/Judgement: Decreased awareness of safety, Decreased awareness of deficits ?Awareness: Intellectual ?Problem Solving: Slow processing, Requires verbal cues, Decreased  initiation ?General Comments: pt insisting she did not eat lunch, confirmed with nurse and nurse tech she did, provide pt with black coffee ?  ?  ?   ?Exercises   ? ?  ?Shoulder Instructions   ? ? ?  ?General Comments    ? ? ?Pertinent Vitals/ Pain       Pain Assessment ?Pain Assessment: No/denies pain ? ?Home Living   ?  ?  ?  ?  ?  ?  ?  ?  ?  ?  ?  ?  ?  ?  ?  ?  ?  ?  ? ?  ?Prior Functioning/Environment    ?  ?  ?  ?   ? ?Frequency ? Min 1X/week  ? ? ? ? ?  ?Progress Toward Goals ? ?OT Goals(current goals can now be found in the care plan section) ? Progress towards OT goals: Progressing toward goals ? ?Acute Rehab OT Goals ?OT Goal Formulation: With patient ?Time For Goal Achievement: 07/23/21 ?Potential to Achieve Goals: Fair  ?Plan Discharge plan remains appropriate;Frequency needs to be updated   ? ?Co-evaluation ? ? ?   ?  ?  ?  ?  ? ?  ?AM-PAC OT "6 Clicks" Daily Activity     ?Outcome Measure ? ? Help from another person eating meals?: A Little ?Help from another person taking care of personal grooming?: A Little ?Help from another person toileting, which includes using toliet, bedpan, or urinal?: Total ?Help from another person bathing (including washing, rinsing, drying)?: A Lot ?Help from another person to put on and taking off regular upper body clothing?: A Lot ?Help from another person to put on and taking off regular lower body clothing?: Total ?6 Click Score: 12 ? ?  ?End of Session   ? ?OT Visit Diagnosis: Other abnormalities of gait and mobility (R26.89);Unsteadiness on feet (R26.81);Muscle weakness (generalized) (M62.81);Hemiplegia and hemiparesis;Other symptoms and signs involving cognitive function ?Hemiplegia - Right/Left: Left ?Hemiplegia - dominant/non-dominant: Non-Dominant ?Hemiplegia - caused by: Unspecified ?  ?Activity Tolerance Patient tolerated treatment well ?  ?Patient Left in bed;with call bell/phone within reach;with bed alarm set ?  ?Nurse Communication   ?  ? ?   ? ?Time:  8756-4332 ?OT Time Calculation (min): 20 min ? ?Charges: OT General Charges ?$OT Visit: 1 Visit ?OT Treatments ?$Self Care/Home Management : 8-22 mins ? ?Nestor Lewandowsky, OTR/L ?Acute Rehabilitation Services ?Pager: [phone] ?Office: [phone]  ? ?[name] ?07/22/2021, 4:05 PM ?

## 2021-07-23 DIAGNOSIS — R531 Weakness: Secondary | ICD-10-CM | POA: Diagnosis not present

## 2021-07-23 LAB — CBC WITH DIFFERENTIAL/PLATELET
Abs Immature Granulocytes: 0.78 10*3/uL — ABNORMAL HIGH (ref 0.00–0.07)
Basophils Absolute: 0.1 10*3/uL (ref 0.0–0.1)
Basophils Relative: 0 %
Eosinophils Absolute: 0 10*3/uL (ref 0.0–0.5)
Eosinophils Relative: 0 %
HCT: 33.8 % — ABNORMAL LOW (ref 36.0–46.0)
Hemoglobin: 10.9 g/dL — ABNORMAL LOW (ref 12.0–15.0)
Immature Granulocytes: 6 %
Lymphocytes Relative: 9 %
Lymphs Abs: 1.3 10*3/uL (ref 0.7–4.0)
MCH: 28.8 pg (ref 26.0–34.0)
MCHC: 32.2 g/dL (ref 30.0–36.0)
MCV: 89.2 fL (ref 80.0–100.0)
Monocytes Absolute: 0.9 10*3/uL (ref 0.1–1.0)
Monocytes Relative: 6 %
Neutro Abs: 11 10*3/uL — ABNORMAL HIGH (ref 1.7–7.7)
Neutrophils Relative %: 79 %
Platelets: 233 10*3/uL (ref 150–400)
RBC: 3.79 MIL/uL — ABNORMAL LOW (ref 3.87–5.11)
RDW: 15.7 % — ABNORMAL HIGH (ref 11.5–15.5)
WBC: 14 10*3/uL — ABNORMAL HIGH (ref 4.0–10.5)
nRBC: 0 % (ref 0.0–0.2)

## 2021-07-23 LAB — BASIC METABOLIC PANEL
Anion gap: 6 (ref 5–15)
BUN: 29 mg/dL — ABNORMAL HIGH (ref 8–23)
CO2: 25 mmol/L (ref 22–32)
Calcium: 8.6 mg/dL — ABNORMAL LOW (ref 8.9–10.3)
Chloride: 109 mmol/L (ref 98–111)
Creatinine, Ser: 0.68 mg/dL (ref 0.44–1.00)
GFR, Estimated: >60 mL/min (ref >60)
Glucose, Bld: 144 mg/dL — ABNORMAL HIGH (ref 70–99)
Potassium: 4.3 mmol/L (ref 3.5–5.1)
Sodium: 140 mmol/L (ref 135–145)

## 2021-07-23 LAB — GLUCOSE, CAPILLARY
Glucose-Capillary: 128 mg/dL — ABNORMAL HIGH (ref 70–99)
Glucose-Capillary: 284 mg/dL — ABNORMAL HIGH (ref 70–99)
Glucose-Capillary: 252 mg/dL — ABNORMAL HIGH (ref 70–99)
Glucose-Capillary: 204 mg/dL — ABNORMAL HIGH (ref 70–99)

## 2021-07-23 NOTE — TOC Progression Note (Signed)
Transition of Care (TOC) - Progression Note  ? ? ?Patient Details  ?Name: Katelyn Kennedy ?MRN: [id number] ?Date of Birth: 1946-02-14 ? ?Transition of Care (TOC) CM/SW Contact  ?Curlene Labrum, RN ?Phone Number: ?07/23/2021, 8:14 AM ? ?Clinical Narrative:    ?CM called and spoke with the patient's daughter, Aimy Sweeting on the phone and she was updated regarding patient's pending review for Medicaid eligibility.  I explained that the patient is unable to qualify for placement at either [place] facility at this time and that she will likely need placement at LTC facility with hospice support since 24 hour support at the home is not available at this time.  [facility] is aware that [facility] are able to offer a bed once the patient has pending Medicaid number. ? ?CM also spoke with the patient's sister, Chucky May, on the phone and she plans to meet with the patient at the bedside this week and will follow up with CM or CSW. ? ?CM and MSW with DTP Team will continue to follow the patient for LTC placement once Medicaid number is pending through financial counseling/[facility]. ? ? ?Expected Discharge Plan: [place] ?Barriers to Discharge: Continued Medical Work up, Ship broker, Family Issues ? ?Expected Discharge Plan and Services ?Expected Discharge Plan: [place] ?  ?  ?  ?Living arrangements for the past 2 months: [place] ?Expected Discharge Date: 07/09/21               ?  ?  ?  ?  ?  ?  ?  ?  ?  ?  ? ? ?Social Determinants of Health (SDOH) Interventions ?  ? ?Readmission Risk Interventions ? ?  07/18/2021  ? 12:39 PM 03/19/2021  ?  2:43 PM 11/08/2020  ?  3:25 PM  ?Readmission Risk Prevention Plan  ?Post Dischage Appt   Complete  ?Medication Screening   Complete  ?Transportation Screening Complete Complete Complete  ?PCP or Specialist Appt within 3-5 Days  Complete   ?[place] or Home Care Consult  Complete   ?Social Work Consult  for [place] Planning/Counseling  Complete   ?Palliative Care Screening  Not Applicable   ?Medication Review Press photographer) Complete Complete   ?PCP or Specialist appointment within 3-5 days of discharge Complete    ?[place] or Home Care Consult Complete    ?SW Recovery Care/Counseling Consult Complete    ?Palliative Care Screening Complete    ?Skilled Nursing Facility Complete    ? ? ?

## 2021-07-23 NOTE — Progress Notes (Signed)
[facility] ?PROGRESS NOTE ? ?Katelyn LIEVANOS CNO:[id number] DOB: 07-05-1945 DOA: 07/07/2021 ?PCP: [name], MD ? ?Status: ?Remains inpatient appropriate because:  ?Unsafe discharge plan-patient continues to require 2+ assist for any mobility.  Patient was living with elderly aunts prior to admission and would need to have significant improvement in mobility before can return to that home setting ?Medicaid has been applied for.  Patient's retirement funding is not enough to pay for SNF alone.  Patient does not meet qualifications for rehab so insurance has denied. ? ?Barriers to discharge: ?Social: ?Family at this time unable to provide care given patient's significant debility ? ?Clinical: ?Continued debility ?Patient has been deemed to lack capacity by the psychiatric team ? ?Level of care:  ?Telemetry Medical ? ? ?Code Status: DNR ?Family Communication: Patient only ?DVT prophylaxis: Eliquis ?COVID vaccination status: Pfizer 08/11/2019, 10/11/2019 ? ?HPI: ? 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  Right frontal mass was diagnosed in July 2022, last chemoradiation was 03/14/2021.  CT on 12/19 revealed multiple intracranial metastatic lesions with some hemorrhagic formation.  Patient underwent staging CT of the chest on 12/19 on 1/26 that revealed PE as well as additional nodules.  Oncologist spoke with neurosurgeon who cleared patient for IV heparin during that hospitalization.  DVT/PE on Eliquis and DM 2 was admitted 07/07/2021 after fall.  Work-up revealed that fall was likely secondary to left-sided weakness from her brain mets.  She had recently been tapered off Decadron.  Patient was restarted on Decadron.  Work-up including CT of head noted extensive dural based hemorrhagic metastatic disease. ? ?Subjective: ?Sleeping ? ?Objective: ?Vitals:  ? 07/23/21 0534 07/23/21 0742  ?BP: (!) 107/54 119/76  ?Pulse: 91 99  ?Resp: 17 16   ?Temp: (!) 97.5 ?F (36.4 ?C) 98.1 ?F (36.7 ?C)  ?SpO2: 98% 97%  ? ? ?Intake/Output Summary (Last 24 hours) at 07/23/2021 0806 ?Last data filed at 07/23/2021 0535 ?Gross per 24 hour  ?Intake 720 ml  ?Output 2150 ml  ?Net -1430 ml  ? ?Filed Weights  ? 07/07/21 0238 07/08/21 0326  ?Weight: (P) 57.6 kg 56.4 kg  ? ? ?Exam: ? ?Constitutional: NAD, calm, comfortable ?Respiratory: clear to auscultation bilaterally, no wheezing, no crackles. Normal respiratory effort. No accessory muscle use.  ?Cardiovascular: Regular pulse, no extremity edema.  S1-S2, normotensive ?Abdomen: no tenderness, abdomen soft, bowel sounds positive. LBM 4/7 ?Neurologic: Sleeping but at baseline cranial nerves II through XII appear to be grossly intact.  Moving all extremities equally but weakly with generalized strength 3+-4/5.  Sensation intact ?Psychological: Being but at baseline alert and oriented x 3. Normal mood.  ? ? ?Assessment/Plan: ?* Fall at home secondary to weakness and known metastatic lesion ?-Patient presents after having a fall at home with reports of worsening weakness.  She had just recently been tapered off of Decadron.   ?-Secondary to metastatic cancer and associated vasogenic edema ?-Continue PT and OT ?-Transitions of care consulted -assisting with placement-continues to require significant assistance with mobility.  Patient would have to have marked improvement in her mobility before she could be discharged home with 2 elderly aunts ?-TOC/servant Center have assisted with Medicaid application and awaiting for a number.  Once number received can proceed with placement ?-4/10 I spoke with the physician director of [facility].  Updated on status.  Currently she is not a candidate since her prognosis and life span is greater than 6 weeks.  It is  understandable that over time this could rapidly progress and recommends we contact them once her status worsens. ?-Upon review of recent encounters patient has been  hospitalized 5 times including this admission since November 2022 ? ? ?A physical therapy consult is indicated based on the patient?s mobility assessment. ?Mobility Assessment (last 72 hours)   ? ? Mobility Assessment   ? ? [place] Name 07/22/21 2240 07/22/21 1500 07/22/21 0758 07/21/21 2300 07/21/21 1600  ? Does patient have an order for bedrest or is patient medically unstable No - Continue assessment -- No - Continue assessment No - Continue assessment --  ? What is the highest level of mobility based on the progressive mobility assessment? Level 3 (Stands with assist) - Balance while standing  and cannot march in place Level 3 (Stands with assist) - Balance while standing  and cannot march in place Level 3 (Stands with assist) - Balance while standing  and cannot march in place Level 3 (Stands with assist) - Balance while standing  and cannot march in place Level 3 (Stands with assist) - Balance while standing  and cannot march in place  ? Is the above level different from baseline mobility prior to current illness? Yes - Recommend PT order -- -- Yes - Recommend PT order --  ? ? [place] Name 07/21/21 0800 07/20/21 2250  ?  ?  ?  ? Does patient have an order for bedrest or is patient medically unstable No - Continue assessment No - Continue assessment     ? What is the highest level of mobility based on the progressive mobility assessment? Level 3 (Stands with assist) - Balance while standing  and cannot march in place Level 3 (Stands with assist) - Balance while standing  and cannot march in place     ? Is the above level different from baseline mobility prior to current illness? Yes - Recommend PT order Yes - Recommend PT order     ? ?  ?  ? ?  ?Leukocytosis ?Subtle trend up ?Not associated with fever ?Continue to follow ? 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 ?Continue Eliquis  ?  ?History of DVT (deep vein thrombosis) ?On Eliquis ?  ?Leukocytosis ?-Secondary to stress demargination from steroid ?  ?Normocytic anemia ?Chronic.  Hemoglobin 11.1 g/dL which appears near patient's baseline ?-Continue to monitor ?  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   ?-Continue oral Decadron as recommended by neurology and neurosurgical team ?-Palliative care consulted following-patient declined adjuvant and hospice ?-Due to metastatic cancer to the brain patient has developed cognitive impairment and has been evaluated by the psychiatric team and no longer lacks capacity ?  ?Vasogenic brain edema  ?Continue Decadron per neurosurgery recommendation ?-Currently on Decadron and Keppra ?  ?Diet-controlled type 2 diabetes mellitus ([place]) ?Stable CBGs ? Last hemoglobin A1c 6.5 on 06/04/2021.   ?-Continue Lantus 10 units nightly ?-Continue SSI ?  ? ? ? ?Data Reviewed: ?Basic Metabolic Panel: ?Recent Labs  ?Lab 07/20/21 ?0227 07/23/21 ?0157  ?NA 137 140  ?K 4.0 4.3  ?CL 106 109  ?CO2 26 25  ?GLUCOSE 204* 144*  ?BUN 32* 29*  ?  CREATININE 0.94 0.68  ?CALCIUM 8.7* 8.6*  ? ?Liver Function Tests: ?Recent Labs  ?Lab 07/20/21 ?0227  ?AST 23  ?ALT 38  ?ALKPHOS 67  ?BILITOT 0.5  ?PROT 4.8*  ?ALBUMIN 2.3*  ? ?No results for input(s): LIPASE, AMYLASE in the last 168 hours. ?No results for input(s): AMMONIA in the last 168 hours. ?CBC: ?Recent Labs  ?Lab 07/20/21 ?0227 07/23/21 ?0157  ?WBC 13.3* 14.0*  ?NEUTROABS 10.3* 11.0*  ?HGB 10.9* 10.9*  ?HCT 33.6* 33.8*  ?MCV 89.8 89.2  ?PLT 263 233  ? ?Cardiac Enzymes: ?No results for input(s): CKTOTAL, CKMB, CKMBINDEX, TROPONINI in the last 168 hours. ?BNP (last 3 results) ?No results for input(s): BNP in the last 8760  hours. ? ?ProBNP (last 3 results) ?No results for input(s): PROBNP in the last 8760 hours. ? ?CBG: ?Recent Labs  ?Lab 07/22/21 ?3893 07/22/21 ?1153 07/22/21 ?1648 07/22/21 ?2106 07/23/21 ?0750  ?GLUCAP 151* 155* 147*

## 2021-07-23 NOTE — Progress Notes (Signed)
Mobility Specialist Progress Note: ? ? 07/23/21 1015  ?Mobility  ?Activity Ambulated with assistance in room;Transferred from bed to chair  ?Level of Assistance Moderate assist, patient does 50-74%  ?Assistive Device Front wheel walker  ?Distance Ambulated (ft) 4 ft  ?Activity Response Tolerated well  ?$Mobility charge 1 Mobility  ? ?Pt received in bed willing to participate in mobility. No complaints of pain. ModA to stand from bed and pivot to recliner. Left in chair with call bell in reach and all needs met.  ? ?Chanika Byland ?Mobility Specialist ?Primary Phone [phone] ? ?

## 2021-07-23 NOTE — Progress Notes (Signed)
Physical Therapy Treatment ?Patient Details ?Name: Katelyn Kennedy ?MRN: [id number] ?DOB: April 24, 1945 ?Today's Date: 07/23/2021 ? ? ?History of Present Illness Pt is a 76 y.o. female admitted 07/07/21 after fall out of bed with reports of worsening L-side weakness related to brain metastases with prior resection. Head CT showing continued presence of brain mets with vasogenic edema and leftward shift of the brain. 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 ? ?  ?PT Comments  ? ? Patient progressing slowly with continued postural/balance deficits and poor awareness.  She was able to report issues with her urinary system and how she had discussed it with staff seemingly appropriately, but very poor carryover of information for standing balance and needs step by step cues for mobilizing to [facility] then back several steps to the bed.  She will continue to benefit from skilled PT in the acute setting and will need SNF level rehab at d/c.    ?Recommendations for follow up therapy are one component of a multi-disciplinary discharge planning process, led by the attending physician.  Recommendations may be updated based on patient status, additional functional criteria and insurance authorization. ? ?Follow Up Recommendations ? Skilled nursing-short term rehab (<3 hours/day) ?  ?  ?Assistance Recommended at Discharge Frequent or constant Supervision/Assistance  ?Patient can return home with the following A lot of help with bathing/dressing/bathroom;Assistance with cooking/housework;Direct supervision/assist for financial management;Direct supervision/assist for medications management;Assist for transportation;Help with stairs or ramp for entrance;Two people to help with walking and/or transfers ?  ?Equipment Recommendations ? Wheelchair (measurements PT);Wheelchair cushion (measurements PT);Hospital bed;Other (comment) (lift equipment)  ?   ?Recommendations for Other Services   ? ? ?  ?Precautions / Restrictions Precautions ?Precautions: Fall;Other (comment) ?Precaution Comments: L side weakness and inattention  ?  ? ?Mobility ? Bed Mobility ?Overal bed mobility: Needs Assistance ?Bed Mobility: Sit to Supine ?  ?  ?  ?Sit to supine: Mod assist ?  ?General bed mobility comments: assist for legs into bed; used trendelenberg and rails with min to mod A for scooting up in bed and repositioning hips/trunk ?  ? ?Transfers ?Overall transfer level: Needs assistance ?Equipment used: Rolling walker (2 wheels) ?Transfers: Sit to/from Stand, Bed to chair/wheelchair/BSC ?Sit to Stand: Mod assist ?Stand pivot transfers: Mod assist, Max assist ?  ?  ?  ?  ?General transfer comment: step pivot recliner to [facility] with max cues, stops to adjust posture as leaning L with L knee flexed and trunk rotated ?  ? ?Ambulation/Gait ?Ambulation/Gait assistance: Mod assist ?Gait Distance (Feet): 2 Feet ?Assistive device: Rolling walker (2 wheels) ?Gait Pattern/deviations: Decreased stride length, Knees buckling, Knee flexed in stance - left, Trunk flexed ?  ?  ?  ?General Gait Details: steps from [facility] to bed with cues for each step and assist for walker management, stops to re-cue for posture and walker proximity; L lateral lean throughout with L knee and hip flexed and rotated ? ? ?Stairs ?  ?  ?  ?  ?  ? ? ?Wheelchair Mobility ?  ? ?Modified Rankin (Stroke Patients Only) ?  ? ? ?  ?Balance Overall balance assessment: Needs assistance ?Sitting-balance support: Feet supported ?Sitting balance-Leahy Scale: Fair ?  ?  ?Standing balance support: Bilateral upper extremity supported ?Standing balance-Leahy Scale: Poor ?Standing balance comment: mod A with walker for balance ?  ?  ?  ?  ?  ?  ?  ?  ?  ?  ?  ?  ? ?  ?  Cognition Arousal/Alertness: Awake/alert ?Behavior During Therapy: Flat affect ?Overall Cognitive Status: Impaired/Different from baseline ?Area of Impairment: Orientation,  Attention, Memory, Following commands, Safety/judgement, Awareness, Problem solving ?  ?  ?  ?  ?  ?  ?  ?  ?Orientation Level: Disoriented to, Time, Situation ?Current Attention Level: Sustained ?Memory: Decreased short-term memory ?Following Commands: Follows one step commands with increased time, Follows one step commands inconsistently ?Safety/Judgement: Decreased awareness of safety, Decreased awareness of deficits ?  ?Problem Solving: Slow processing, Requires verbal cues, Decreased initiation ?General Comments: frequent cues for mobility and posture during mobility ?  ?  ? ?  ?Exercises   ? ?  ?General Comments   ?  ?  ? ?Pertinent Vitals/Pain Pain Assessment ?Pain Assessment: Faces ?Faces Pain Scale: Hurts even more ?Pain Location: perineal area ?Pain Descriptors / Indicators: Discomfort, Sore ?Pain Intervention(s): Monitored during session, Repositioned, Other (comment) (applied barrier cream)  ? ? ?Home Living   ?  ?  ?  ?  ?  ?  ?  ?  ?  ?   ?  ?Prior Function    ?  ?  ?   ? ?PT Goals (current goals can now be found in the care plan section) Progress towards PT goals: Progressing toward goals ? ?  ?Frequency ? ? ? Min 2X/week ? ? ? ?  ?PT Plan Current plan remains appropriate  ? ? ?Co-evaluation   ?  ?  ?  ?  ? ?  ?AM-PAC PT "6 Clicks" Mobility   ?Outcome Measure ? Help needed turning from your back to your side while in a flat bed without using bedrails?: A Lot ?Help needed moving from lying on your back to sitting on the side of a flat bed without using bedrails?: A Lot ?Help needed moving to and from a bed to a chair (including a wheelchair)?: Total ?Help needed standing up from a chair using your arms (e.g., wheelchair or bedside chair)?: Total ?Help needed to walk in hospital room?: Total ?Help needed climbing 3-5 steps with a railing? : Total ?6 Click Score: 8 ? ?  ?End of Session Equipment Utilized During Treatment: Gait belt ?Activity Tolerance: Patient limited by fatigue ?Patient left: in  bed;with call bell/phone within reach;with bed alarm set ?  ?PT Visit Diagnosis: Unsteadiness on feet (R26.81);Muscle weakness (generalized) (M62.81);Repeated falls (R29.6);History of falling (Z91.81) ?  ? ? ?Time: 1435-1510 ?PT Time Calculation (min) (ACUTE ONLY): 35 min ? ?Charges:  $Therapeutic Activity: 23-37 mins          ?          ? ?Magda Kiel, PT ?Acute Rehabilitation Services ?YNWGN:[phone] ?Office:[phone] ?07/23/2021 ? ? ? ? ?Reginia Naas ?07/23/2021, 5:21 PM ? ?

## 2021-07-24 DIAGNOSIS — R531 Weakness: Secondary | ICD-10-CM | POA: Diagnosis not present

## 2021-07-24 LAB — GLUCOSE, CAPILLARY
Glucose-Capillary: 92 mg/dL (ref 70–99)
Glucose-Capillary: 443 mg/dL — ABNORMAL HIGH (ref 70–99)
Glucose-Capillary: 393 mg/dL — ABNORMAL HIGH (ref 70–99)
Glucose-Capillary: 169 mg/dL — ABNORMAL HIGH (ref 70–99)
Glucose-Capillary: 88 mg/dL (ref 70–99)

## 2021-07-24 MED ORDER — INSULIN ASPART 100 UNIT/ML IJ SOLN
10.0000 [IU] | Freq: Once | INTRAMUSCULAR | Status: AC
  Administered 2021-07-24: 10 [IU] via SUBCUTANEOUS

## 2021-07-24 NOTE — Progress Notes (Signed)
Mobility Specialist Progress Note: ? ? 07/24/21 1016  ?Mobility  ?Activity Ambulated with assistance in room;Transferred from bed to chair  ?Level of Assistance Moderate assist, patient does 50-74%  ?Assistive Device Front wheel walker  ?Distance Ambulated (ft) 4 ft  ?Activity Response Tolerated well  ?$Mobility charge 1 Mobility  ? ?Pt received in bed willing to participate in mobility. Complaints of muscle soreness in legs. ModA to step to recliner and step by step cues to step to recliner. Left in chair with call bell in reach, all needs met and chair alarm on.  ? ?Katelyn Kennedy ?Mobility Specialist ?Primary Phone [phone] ? ?

## 2021-07-24 NOTE — Progress Notes (Signed)
Mobility Specialist Progress Note: ? ? 07/24/21 1337  ?Mobility  ?Activity Ambulated with assistance in room;Transferred to/from [facility]  ?Level of Assistance Moderate assist, patient does 50-74%  ?Assistive Device Front wheel walker  ?Distance Ambulated (ft) 6 ft  ?Activity Response Tolerated well  ?$Mobility charge 1 Mobility  ? ?Pt received in chair needing to go to [facility] then to bed. ModA to stand and pivot to [facility]. No complaints of pain. Left in bed with call bell in reach and all needs met.  ? ?Nazyia Gaugh ?Mobility Specialist ?Primary Phone [phone] ? ?

## 2021-07-24 NOTE — Progress Notes (Signed)
[facility] ?PROGRESS NOTE ? ?Katelyn Kennedy DXA:[id number] DOB: [birth year] DOA: 07/07/2021 ?PCP: [name], MD ? ?Status: ?Remains inpatient appropriate because:  ?Unsafe discharge plan-patient continues to require 2+ assist for any mobility.  Patient was living with elderly aunts prior to admission and would need to have significant improvement in mobility before can return to that home setting ?Medicaid has been applied for.  Patient's retirement funding is not enough to pay for SNF alone.  Patient does not meet qualifications for rehab so insurance has denied. ? ?Barriers to discharge: ?Social: ?Family at this time unable to provide care given patient's significant debility ? ?Clinical: ?Continued debility ?Patient has been deemed to lack capacity by the psychiatric team ? ?Level of care:  ?Telemetry Medical ? ? ?Code Status: DNR ?Family Communication: Patient only ?DVT prophylaxis: Eliquis ?COVID vaccination status: Pfizer 08/11/2019, 10/11/2019 ? ?HPI: ? 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  Right frontal mass was diagnosed in July 2022, last chemoradiation was 03/14/2021.  CT on 12/19 revealed multiple intracranial metastatic lesions with some hemorrhagic formation.  Patient underwent staging CT of the chest on 12/19 on 1/26 that revealed PE as well as additional nodules.  Oncologist spoke with neurosurgeon who cleared patient for IV heparin during that hospitalization.  DVT/PE on Eliquis and DM 2 was admitted 07/07/2021 after fall.  Work-up revealed that fall was likely secondary to left-sided weakness from her brain mets.  She had recently been tapered off Decadron.  Patient was restarted on Decadron.  Work-up including CT of head noted extensive dural based hemorrhagic metastatic disease. ? ?Subjective: ?Patient awake and pleasant.  I told her that I did not awaken her when I examined her yesterday because she was sleeping  soundly.  She said "thank you for that but I do remember you coming by later".  Of note I did not come by later. ? ?Objective: ?Vitals:  ? 07/23/21 2030 07/24/21 0615  ?BP: (!) 101/57 (!) 99/54  ?Pulse: (!) 103 90  ?Resp: 16 16  ?Temp: 98.2 ?F (36.8 ?C) 98.1 ?F (36.7 ?C)  ?SpO2: 97% 98%  ? ? ?Intake/Output Summary (Last 24 hours) at 07/24/2021 0823 ?Last data filed at 07/23/2021 2150 ?Gross per 24 hour  ?Intake 240 ml  ?Output --  ?Net 240 ml  ? ?Filed Weights  ? 07/07/21 0238 07/08/21 0326  ?Weight: (P) 57.6 kg 56.4 kg  ? ? ?Exam: ? ?Constitutional: NAD, calm, comfortable ?Respiratory: clear to auscultation bilaterally, no wheezing, no crackles. Normal respiratory effort. No accessory muscle use.  ?Cardiovascular: Regular pulse, no extremity edema.  S1-S2, normotensive ?Abdomen: no tenderness, abdomen soft, bowel sounds positive. LBM 4/7 ?Neurologic: cranial nerves II through XII appear to be grossly intact.  Moving all extremities equally but weakly with generalized strength 3+-4/5.  Sensation intact ?Psychological: alert and oriented x 3.  Still has evidence of short-term memory deficits and even slightly more advanced executive skills are impaired and she lacks capacity to make independent decisions.  Normal mood.  ? ? ?Assessment/Plan: ?* Fall at home secondary to weakness and known metastatic lesion ?-Patient presents after having a fall at home with reports of worsening weakness.  She had just recently been tapered off of Decadron.   ?-Continue PT and OT ?-Transitions of care consulted -assisting with placement-continues to require significant assistance with mobility.  Patient would have to have marked improvement in her mobility before she could be discharged home with 2 elderly  aunts ?-TOC/servant Center have assisted with Medicaid application and awaiting for a number.  Once number received can proceed with placement ?-Upon review of recent encounters patient has been hospitalized 5 times including this  admission since November 2022 ? ? ?A physical therapy consult is indicated based on the patient?s mobility assessment. ?Mobility Assessment (last 72 hours)   ? ? Mobility Assessment   ? ? [place] Name 07/23/21 2150 07/23/21 1718 07/23/21 1011 07/22/21 2240 07/22/21 1500  ? Does patient have an order for bedrest or is patient medically unstable No - Continue assessment -- No - Continue assessment No - Continue assessment --  ? What is the highest level of mobility based on the progressive mobility assessment? Level 3 (Stands with assist) - Balance while standing  and cannot march in place Level 3 (Stands with assist) - Balance while standing  and cannot march in place Level 3 (Stands with assist) - Balance while standing  and cannot march in place Level 3 (Stands with assist) - Balance while standing  and cannot march in place Level 3 (Stands with assist) - Balance while standing  and cannot march in place  ? Is the above level different from baseline mobility prior to current illness? Yes - Recommend PT order -- Yes - Recommend PT order Yes - Recommend PT order --  ? ? [place] Name 07/22/21 0758 07/21/21 2300 07/21/21 1600  ?  ?  ? Does patient have an order for bedrest or is patient medically unstable No - Continue assessment No - Continue assessment --    ? What is the highest level of mobility based on the progressive mobility assessment? Level 3 (Stands with assist) - Balance while standing  and cannot march in place Level 3 (Stands with assist) - Balance while standing  and cannot march in place Level 3 (Stands with assist) - Balance while standing  and cannot march in place    ? Is the above level different from baseline mobility prior to current illness? -- Yes - Recommend PT order --    ? ?  ?  ? ?  ?Leukocytosis ?Not associated with fever or any other symptoms including on clinical exam ?Continue to follow ? 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 ?Continue Eliquis  ?  ?History of DVT (deep vein thrombosis) ?On Eliquis ?  ?Leukocytosis ?-Secondary to stress demargination from steroid ?  ?Normocytic anemia ?Chronic.  Hemoglobin 11.1 g/dL which appears near patient's baseline ?-Continue to monitor ?  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   ?-Continue oral Decadron as recommended by neurology and neurosurgical team ?-Palliative care consulted following-patient declined adjuvant and hospice ?-Due to metastatic cancer to the brain patient has developed cognitive impairment and has been evaluated by the psychiatric team and no longer lacks capacity ?  ?Vasogenic brain edema  ?Continue Decadron per neurosurgery recommendation ?-Currently on Decadron and Keppra ?  ?Diet-controlled type 2 diabetes mellitus ([place]) ?Stable CBGs ? Last hemoglobin A1c 6.5 on 06/04/2021.   ?-Continue Lantus 10 units nightly ?-Continue SSI ?  ? ? ? ?Data Reviewed: ?Basic Metabolic Panel: ?Recent Labs  ?Lab 07/20/21 ?0227 07/23/21 ?0157  ?  NA 137 140  ?K 4.0 4.3  ?CL 106 109  ?CO2 26 25  ?GLUCOSE 204* 144*  ?BUN 32* 29*  ?CREATININE 0.94 0.68  ?CALCIUM 8.7* 8.6*  ? ?Liver Function Tests: ?Recent Labs  ?Lab 07/20/21 ?0227  ?AST 23  ?ALT 38  ?ALKPHOS 67  ?BILITOT 0.5  ?PROT 4.8*  ?ALBUMIN 2.3*  ? ?No results for input(s): LIPASE, AMYLASE in the last 168 hours. ?No results for input(s): AMMONIA in the last 168 hours. ?CBC: ?Recent Labs  ?Lab 07/20/21 ?0227 07/23/21 ?0157  ?WBC 13.3* 14.0*  ?NEUTROABS 10.3* 11.0*  ?HGB 10.9* 10.9*  ?HCT 33.6* 33.8*  ?MCV 89.8 89.2  ?PLT 263 233  ? ?Cardiac Enzymes: ?No results for input(s): CKTOTAL, CKMB, CKMBINDEX, TROPONINI in the  last 168 hours. ?BNP (last 3 results) ?No results for input(s): BNP in the last 8760 hours. ? ?ProBNP (last 3 results) ?No results for input(s): PROBNP in the last 8760 hours. ? ?CBG: ?Recent Labs  ?Lab 0

## 2021-07-25 DIAGNOSIS — R531 Weakness: Secondary | ICD-10-CM | POA: Diagnosis not present

## 2021-07-25 LAB — GLUCOSE, CAPILLARY
Glucose-Capillary: 285 mg/dL — ABNORMAL HIGH (ref 70–99)
Glucose-Capillary: 337 mg/dL — ABNORMAL HIGH (ref 70–99)
Glucose-Capillary: 227 mg/dL — ABNORMAL HIGH (ref 70–99)
Glucose-Capillary: 166 mg/dL — ABNORMAL HIGH (ref 70–99)

## 2021-07-25 NOTE — Progress Notes (Signed)
Mobility Specialist Progress Note: ? ? 07/25/21 1020  ?Mobility  ?Activity Ambulated with assistance in room;Transferred from bed to chair  ?Level of Assistance Minimal assist, patient does 75% or more  ?Assistive Device Front wheel walker  ?Distance Ambulated (ft) 4 ft  ?Activity Response Tolerated well  ?$Mobility charge 1 Mobility  ? ?Pt received in bed willing to participate in mobility. No complaints of pain. MinA to stand and step to recliner. Left in chair with call bell in reach and all needs met. Chair alarm on.  ? ?Katelyn Kennedy ?Mobility Specialist ?Primary Phone [phone] ? ?

## 2021-07-25 NOTE — Progress Notes (Addendum)
Inpatient Diabetes Program Recommendations ? ?AACE/ADA: New Consensus Statement on Inpatient Glycemic Control (2015) ? ?Target Ranges:  Prepandial:   less than 140 mg/dL ?     Peak postprandial:   less than 180 mg/dL (1-2 hours) ?     Critically ill patients:  140 - 180 mg/dL  ? ?Lab Results  ?Component Value Date  ? GLUCAP 285 (H) 07/25/2021  ? HGBA1C 6.5 05/15/2021  ? ? ?Review of Glycemic Control ? Latest Reference Range & Units 07/24/21 08:32 07/24/21 12:40 07/24/21 15:01 07/24/21 17:50 07/24/21 20:52 07/25/21 08:58  ?Glucose-Capillary 70 - 99 mg/dL 92 443 (H) 393 (H) 169 (H) 88 285 (H)  ? ?Diabetes history: DM 2 ?Outpatient Diabetes medications:  ?Semglee 20 units qhs ?Novolog 0-15 units tid + hs ?Current orders for Inpatient glycemic control:  ?Semglee 20 units qhs ?Novolog 0-15 units tid + hs ? ?Decadron 4 mg Q12 hours ? ?MD Note: ? Semglee held last night due to glucose being 88 due to amount of Novolog doses to bring down glucose in the 400's yesterday. Expect elevations today until basal insulin is scheduled for tonight.  ? ?Inpatient Diabetes Program Recommendations:   ? ?-   Consider adding Novolog meal coverage 3 units tid if eating >50% of meals to prevent post prandial spikes ? ?Thanks, ? ?Tama Headings RN, MSN, BC-ADM ?Inpatient Diabetes Coordinator ?Team Pager [phone] (8a-5p) ? ? ?

## 2021-07-25 NOTE — Progress Notes (Signed)
[facility] was informed  that patient CBG was 88 and scheduled to receive 20 unitis of long-acting insulin. Was instructed to hold the insulin tonight,she was given apple sauce and graham crackers ?

## 2021-07-25 NOTE — Progress Notes (Signed)
[facility] ?PROGRESS NOTE ? ?Katelyn Kennedy FFM:[id number] DOB: 1945/10/26 DOA: 07/07/2021 ?PCP: [name], MD ? ?Status: ?Remains inpatient appropriate because:  ?Unsafe discharge plan-patient continues to require 2+ assist for any mobility.  Patient was living with elderly aunts prior to admission and would need to have significant improvement in mobility before can return to that home setting ?Medicaid has been applied for.  Patient's retirement funding is not enough to pay for SNF alone.  Patient does not meet qualifications for rehab so insurance has denied. ? ?Barriers to discharge: ?Social: ?Family at this time unable to provide care given patient's significant debility ? ?Clinical: ?Continued debility ?Patient has been deemed to lack capacity by the psychiatric team ? ?Level of care:  ?Telemetry Medical ? ? ?Code Status: DNR ?Family Communication: Patient only ?DVT prophylaxis: Eliquis ?COVID vaccination status: Pfizer 08/11/2019, 10/11/2019 ? ?HPI: ? 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  Right frontal mass was diagnosed in July 2022, last chemoradiation was 03/14/2021.  CT on 12/19 revealed multiple intracranial metastatic lesions with some hemorrhagic formation.  Patient underwent staging CT of the chest on 12/19 on 1/26 that revealed PE as well as additional nodules.  Oncologist spoke with neurosurgeon who cleared patient for IV heparin during that hospitalization.  DVT/PE on Eliquis and DM 2 was admitted 07/07/2021 after fall.  Work-up revealed that fall was likely secondary to left-sided weakness from her brain mets.  She had recently been tapered off Decadron.  Patient was restarted on Decadron.  Work-up including CT of head noted extensive dural based hemorrhagic metastatic disease. ? ?Subjective: ?Pleasantly confused.  Eating breakfast in bed.  No complaints. ? ?Objective: ?Vitals:  ? 07/24/21 2340 07/25/21 0454  ?BP:  114/67 (!) 109/58  ?Pulse: 100 90  ?Resp: (!) 8 16  ?Temp: 98 ?F (36.7 ?C) 98.3 ?F (36.8 ?C)  ?SpO2: 99% 97%  ? ? ?Intake/Output Summary (Last 24 hours) at 07/25/2021 0801 ?Last data filed at 07/25/2021 0230 ?Gross per 24 hour  ?Intake 240 ml  ?Output 600 ml  ?Net -360 ml  ? ?Filed Weights  ? 07/07/21 0238 07/08/21 0326  ?Weight: (P) 57.6 kg 56.4 kg  ? ? ?Exam: ? ?Constitutional: NAD, calm, comfortable ?Respiratory: clear to auscultation bilaterally, no wheezing, no crackles. Normal respiratory effort. No accessory muscle use.  ?Cardiovascular: Regular pulse, no extremity edema.  S1-S2, normotensive ?Abdomen: no tenderness, abdomen soft, bowel sounds positive. LBM 4/12 ?Neurologic: cranial nerves II through XII appear to be grossly intact.  Moving all extremities equally but weakly with generalized strength 3+-4/5.  Sensation intact ?Psychological: alert and oriented x 3.  Short-term memory deficits and even slightly more advanced executive skills are impaired and she lacks capacity to make independent decisions.  Normal mood.  ? ? ?Assessment/Plan: ?* Fall at home secondary to weakness and known metastatic lesion ?-Patient presents after having a fall at home with reports of worsening weakness.  She had just recently been tapered off of Decadron.   ?-Continue PT and OT ?-Transitions of care consulted -assisting with placement-continues to require significant assistance with mobility.  Patient would have to have marked improvement in her mobility before she could be discharged home with 2 elderly aunts ?-TOC/servant Center have assisted with Medicaid application and awaiting for a number.  Once number received can proceed with placement ?-Upon review of recent encounters patient has been hospitalized 5 times including this admission since November 2022 ? ? ?A physical  therapy consult is indicated based on the patient?s mobility assessment. ?Mobility Assessment (last 72 hours)   ? ? Mobility Assessment   ? ? [place] Name  07/24/21 2059 07/24/21 0900 07/23/21 2150 07/23/21 1718 07/23/21 1011  ? Does patient have an order for bedrest or is patient medically unstable Yes- Bedfast (Level 1) - Complete No - Continue assessment No - Continue assessment -- No - Continue assessment  ? What is the highest level of mobility based on the progressive mobility assessment? Level 3 (Stands with assist) - Balance while standing  and cannot march in place Level 3 (Stands with assist) - Balance while standing  and cannot march in place Level 3 (Stands with assist) - Balance while standing  and cannot march in place Level 3 (Stands with assist) - Balance while standing  and cannot march in place Level 3 (Stands with assist) - Balance while standing  and cannot march in place  ? Is the above level different from baseline mobility prior to current illness? Yes - Recommend PT order Yes - Recommend PT order Yes - Recommend PT order -- Yes - Recommend PT order  ? ? [place] Name 07/22/21 2240 07/22/21 1500  ?  ?  ?  ? Does patient have an order for bedrest or is patient medically unstable No - Continue assessment --     ? What is the highest level of mobility based on the progressive mobility assessment? Level 3 (Stands with assist) - Balance while standing  and cannot march in place Level 3 (Stands with assist) - Balance while standing  and cannot march in place     ? Is the above level different from baseline mobility prior to current illness? Yes - Recommend PT order --     ? ?  ?  ? ?  ?Leukocytosis ?Not associated with fever or any other symptoms including on clinical exam ?Continue to follow ? 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 ?Continue Eliquis  ?  ?History of DVT (deep vein thrombosis) ?On Eliquis ?  ?Leukocytosis ?-Secondary to stress demargination from steroid ?   ?Normocytic anemia ?Chronic.  Hemoglobin 11.1 g/dL which appears near patient's baseline ?-Continue to monitor ?  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   ?-Continue oral Decadron as recommended by neurology and neurosurgical team ?-Palliative care consulted following-patient declined adjuvant and hospice ?-Due to metastatic cancer to the brain patient has developed cognitive impairment and has been evaluated by the psychiatric team and no longer lacks capacity ?  ?Vasogenic brain edema  ?Continue Decadron per neurosurgery recommendation ?-Currently on Decadron and Keppra ?  ?Diet-controlled type 2 diabetes mellitus ([place]) ?Stable CBGs ? Last hemoglobin A1c 6.5 on 06/04/2021.   ?-Continue Lantus 10 units nightly ?-Continue SSI ?  ? ? ? ?Data Reviewed: ?Basic Metabolic Panel: ?Recent Labs  ?Lab 07/20/21 ?0227 07/23/21 ?0157  ?NA 137 140  ?K 4.0 4.3  ?CL 106 109  ?CO2 26 25  ?GLUCOSE 204* 144*  ?BUN 32* 29*  ?CREATININE 0.94 0.68  ?CALCIUM 8.7* 8.6*  ? ?Liver Function Tests: ?Recent Labs  ?Lab 07/20/21 ?0227  ?AST 23  ?ALT 38  ?ALKPHOS  67  ?BILITOT 0.5  ?PROT 4.8*  ?ALBUMIN 2.3*  ? ?No results for input(s): LIPASE, AMYLASE in the last 168 hours. ?No results for input(s): AMMONIA in the last 168 hours. ?CBC: ?Recent Labs  ?Lab 07/20/21 ?0227 07/23/21 ?0157  ?WBC 13.3* 14.0*  ?NEUTROABS 10.3* 11.0*  ?HGB 10.9* 10.9*  ?HCT 33.6* 33.8*  ?MCV 89.8 89.2  ?PLT 263 233  ? ?Cardiac Enzymes: ?No results for input(s): CKTOTAL, CKMB, CKMBINDEX, TROPONINI in the last 168 hours. ?BNP (last 3 results) ?No results for input(s): BNP in the last 8760 hours. ? ?ProBNP (last 3 results) ?No results for input(s): PROBNP in the last 8760 hours. ? ?CBG: ?Recent Labs  ?Lab 07/24/21 ?0832 07/24/21 ?1240 07/24/21 ?1501  07/24/21 ?1750 07/24/21 ?2052  ?GLUCAP 92 443* 393* 169* 88  ? ? ?No results found for this or any previous visit (from the past 240 hour(s)).  ? ?Studies: ?No results found. ? ?Scheduled Meds: ? amitriptyline  50

## 2021-07-25 NOTE — Progress Notes (Signed)
Mobility Specialist Progress Note: ? ? 07/25/21 1434  ?Mobility  ?Activity Ambulated with assistance in room;Transferred from chair to bed  ?Level of Assistance Moderate assist, patient does 50-74%  ?Assistive Device Front wheel walker  ?Distance Ambulated (ft) 4 ft  ?Activity Response Tolerated well  ?$Mobility charge 1 Mobility  ? ?Pt received in chair willing to participate in mobility. No complaints of pain. ModA to stand and step to bed. Pt also required cues to not sit to early and to move the left leg. Left in bed with call bell in reach and all needs met.  ? ?Kalla Watson ?Mobility Specialist ?Primary Phone [phone] ? ?

## 2021-07-26 DIAGNOSIS — C7931 Secondary malignant neoplasm of brain: Secondary | ICD-10-CM | POA: Diagnosis not present

## 2021-07-26 DIAGNOSIS — C3490 Malignant neoplasm of unspecified part of unspecified bronchus or lung: Secondary | ICD-10-CM | POA: Diagnosis not present

## 2021-07-26 DIAGNOSIS — Z86711 Personal history of pulmonary embolism: Secondary | ICD-10-CM | POA: Diagnosis not present

## 2021-07-26 LAB — GLUCOSE, CAPILLARY
Glucose-Capillary: 142 mg/dL — ABNORMAL HIGH (ref 70–99)
Glucose-Capillary: 243 mg/dL — ABNORMAL HIGH (ref 70–99)
Glucose-Capillary: 237 mg/dL — ABNORMAL HIGH (ref 70–99)
Glucose-Capillary: 161 mg/dL — ABNORMAL HIGH (ref 70–99)

## 2021-07-26 NOTE — Progress Notes (Signed)
Please review detailed progress note by Erin Hearing, NP on 4/14 for details. ?Patient seen and examined. ? ?Patient mentions that she feels well this morning.  Denies any pain currently.  Was experiencing some pain in the left lower extremity which has since resolved.  Denies any shortness of breath. ? ?Vital signs reviewed and noted to be stable. ? ?Lungs are clear to auscultation bilaterally with diminished air entry at the bases. ?S1-S2 is normal regular. ?Abdomen is soft. ?Able to move both of her lower extremities. ? ?CBGs reviewed. ?Last CBC and basic metabolic panel from April 12 reviewed. ? 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  Subsequently developed DVT and PE on Eliquis.  Patient with cognitive impairment due to her metastatic disease.  Seen by psychiatry and thought not to have capacity. ? ?She remains on dexamethasone as recommended by oncology and neurosurgery.  Remains on Keppra. ? ?Currently waiting on placement. ? ?Please review progress note from 4/14 for further details regarding patient's various medical conditions. ? ?We will continue to monitor closely. ? ?Katelyn Kennedy ?07/26/2021 ? ?

## 2021-07-27 LAB — GLUCOSE, CAPILLARY
Glucose-Capillary: 99 mg/dL (ref 70–99)
Glucose-Capillary: 247 mg/dL — ABNORMAL HIGH (ref 70–99)
Glucose-Capillary: 269 mg/dL — ABNORMAL HIGH (ref 70–99)
Glucose-Capillary: 112 mg/dL — ABNORMAL HIGH (ref 70–99)
Glucose-Capillary: 181 mg/dL — ABNORMAL HIGH (ref 70–99)

## 2021-07-27 NOTE — Progress Notes (Signed)
Please review detailed progress note by Erin Hearing, NP on 4/14 for details. ?Patient seen and examined. ? ?Patient denies any complaints this morning.  Slept well.  Tolerating her diet.  Urinating.  Was out of bed to chair yesterday. ? ?Vital signs remained stable. ? ?Lungs are clear to auscultation bilaterally ?S1-S2 is normal regular ?Abdomen is soft.  Nontender nondistended. ? ?Will order labs for tomorrow.  CBGs reviewed. ? 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  Subsequently developed DVT and PE on Eliquis.  Patient with cognitive impairment due to her metastatic disease.  Seen by psychiatry and thought not to have capacity. ? ?She remains on dexamethasone as recommended by oncology and neurosurgery.  Remains on Keppra. ? ?Currently waiting on placement. ? ?Please review progress note from 4/14 for further details regarding patient's various medical conditions. ? ?We will continue to monitor closely. ? ?Bonnielee Haff ?07/27/2021 ? ?

## 2021-07-28 DIAGNOSIS — R531 Weakness: Secondary | ICD-10-CM | POA: Diagnosis not present

## 2021-07-28 DIAGNOSIS — Z86711 Personal history of pulmonary embolism: Secondary | ICD-10-CM | POA: Diagnosis not present

## 2021-07-28 DIAGNOSIS — G936 Cerebral edema: Secondary | ICD-10-CM | POA: Diagnosis not present

## 2021-07-28 DIAGNOSIS — C3490 Malignant neoplasm of unspecified part of unspecified bronchus or lung: Secondary | ICD-10-CM | POA: Diagnosis not present

## 2021-07-28 LAB — CBC
HCT: 33.9 % — ABNORMAL LOW (ref 36.0–46.0)
Hemoglobin: 10.7 g/dL — ABNORMAL LOW (ref 12.0–15.0)
MCH: 28.5 pg (ref 26.0–34.0)
MCHC: 31.6 g/dL (ref 30.0–36.0)
MCV: 90.2 fL (ref 80.0–100.0)
Platelets: 210 10*3/uL (ref 150–400)
RBC: 3.76 MIL/uL — ABNORMAL LOW (ref 3.87–5.11)
RDW: 16.1 % — ABNORMAL HIGH (ref 11.5–15.5)
WBC: 9 10*3/uL (ref 4.0–10.5)
nRBC: 0 % (ref 0.0–0.2)

## 2021-07-28 LAB — GLUCOSE, CAPILLARY
Glucose-Capillary: 163 mg/dL — ABNORMAL HIGH (ref 70–99)
Glucose-Capillary: 470 mg/dL — ABNORMAL HIGH (ref 70–99)
Glucose-Capillary: 171 mg/dL — ABNORMAL HIGH (ref 70–99)
Glucose-Capillary: 122 mg/dL — ABNORMAL HIGH (ref 70–99)

## 2021-07-28 LAB — COMPREHENSIVE METABOLIC PANEL
ALT: 51 U/L — ABNORMAL HIGH (ref 0–44)
AST: 23 U/L (ref 15–41)
Albumin: 2 g/dL — ABNORMAL LOW (ref 3.5–5.0)
Alkaline Phosphatase: 58 U/L (ref 38–126)
Anion gap: 7 (ref 5–15)
BUN: 34 mg/dL — ABNORMAL HIGH (ref 8–23)
CO2: 24 mmol/L (ref 22–32)
Calcium: 8.5 mg/dL — ABNORMAL LOW (ref 8.9–10.3)
Chloride: 105 mmol/L (ref 98–111)
Creatinine, Ser: 1.01 mg/dL — ABNORMAL HIGH (ref 0.44–1.00)
GFR, Estimated: 57 mL/min — ABNORMAL LOW (ref >60)
Glucose, Bld: 259 mg/dL — ABNORMAL HIGH (ref 70–99)
Potassium: 4.3 mmol/L (ref 3.5–5.1)
Sodium: 136 mmol/L (ref 135–145)
Total Bilirubin: 0.5 mg/dL (ref 0.3–1.2)
Total Protein: 4.5 g/dL — ABNORMAL LOW (ref 6.5–8.1)

## 2021-07-28 MED ORDER — INSULIN ASPART 100 UNIT/ML IJ SOLN: 15.0000 [IU] | Freq: Once | INTRAMUSCULAR | Status: DC

## 2021-07-28 MED ORDER — INSULIN ASPART 100 UNIT/ML IJ SOLN: 25.0000 [IU] | Freq: Once | INTRAMUSCULAR | Status: DC

## 2021-07-28 MED ORDER — INSULIN GLARGINE-YFGN 100 UNIT/ML ~~LOC~~ SOLN
10.0000 [IU] | Freq: Every day | SUBCUTANEOUS | Status: DC
  Administered 2021-07-28 – 2021-08-04 (×8): 10 [IU] via SUBCUTANEOUS
  Filled 2021-07-28 (×9): qty 0.1

## 2021-07-28 NOTE — Progress Notes (Signed)
[facility] ?PROGRESS NOTE ? ?Katelyn Kennedy EZM:[id number] DOB: 05/25/1945 DOA: 07/07/2021 ?PCP: [name], MD ? ?Status: ?Remains inpatient appropriate because:  ?Unsafe discharge plan-patient continues to require 2+ assist for any mobility.  Patient was living with elderly aunts prior to admission and would need to have significant improvement in mobility before can return to that home setting ?Medicaid has been applied for.  Patient's retirement funding is not enough to pay for SNF alone.  Patient does not meet qualifications for rehab so insurance has denied. ? ?Barriers to discharge: ?Social: ?Family at this time unable to provide care given patient's significant debility ? ?Clinical: ?Continued debility ?Patient has been deemed to lack capacity by the psychiatric team ? ?Level of care:  ?Med-Surg ? ? ?Code Status: DNR ?Family Communication: Patient only ?DVT prophylaxis: Eliquis ?COVID vaccination status: Pfizer 08/11/2019, 10/11/2019 ? ?HPI: ? 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  Right frontal mass was diagnosed in July 2022, last chemoradiation was 03/14/2021.  CT on 12/19 revealed multiple intracranial metastatic lesions with some hemorrhagic formation.  Patient underwent staging CT of the chest on 12/19 on 1/26 that revealed PE as well as additional nodules.  Oncologist spoke with neurosurgeon who cleared patient for IV heparin during that hospitalization.  DVT/PE on Eliquis and DM 2 was admitted 07/07/2021 after fall.  Work-up revealed that fall was likely secondary to left-sided weakness from her brain mets.  She had recently been tapered off Decadron.  Patient was restarted on Decadron.  Work-up including CT of head noted extensive dural based hemorrhagic metastatic disease. ? ?Subjective: ?Pleasantly confused per usual.  Sitting up in bed eating breakfast. ? ?Objective: ?Vitals:  ? 07/28/21 0459 07/28/21 0801  ?BP: (!)  104/55 115/66  ?Pulse: 89 93  ?Resp: 17 19  ?Temp: 98.1 ?F (36.7 ?C) 97.8 ?F (36.6 ?C)  ?SpO2: 97% 95%  ? ? ?Intake/Output Summary (Last 24 hours) at 07/28/2021 0806 ?Last data filed at 07/27/2021 2204 ?Gross per 24 hour  ?Intake 780 ml  ?Output 900 ml  ?Net -120 ml  ? ?Filed Weights  ? 07/07/21 0238 07/08/21 0326  ?Weight: (P) 57.6 kg 56.4 kg  ? ? ?Exam: ? ?Constitutional: NAD, calm, comfortable ?Respiratory: Room air, lung sounds are clear posteriorly, normal respiratory effort ?Cardiovascular: Regular pulse, no extremity edema.  S1-S2, normotensive ?Abdomen: no tenderness, abdomen soft, bowel sounds positive. LBM 4/15 ?Neurologic: cranial nerves II through XII appear to be grossly intact.  Moving all extremities equally but weakly with generalized strength 3+-4/5.  Sensation intact ?Psychological: alert and oriented x 3.  Short-term memory deficits and even slightly more advanced executive skills are impaired and she lacks capacity to make independent decisions.  Normal mood.  ? ? ?Assessment/Plan: ?* Fall at home secondary to weakness and known metastatic lesion ?-Patient presents after having a fall at home with reports of worsening weakness.  She had just recently been tapered off of Decadron.   ?-Continue PT and OT ?-Transitions of care consulted -assisting with placement-continues to require significant assistance with mobility.  Patient would have to have marked improvement in her mobility before she could be discharged home with 2 elderly aunts ?-TOC/servant Center have assisted with Medicaid application and awaiting for a number.  Once number received can proceed with placement ?-Upon review of recent encounters patient has been hospitalized 5 times including this admission since November 2022 ? ? ?A physical therapy consult is indicated based on the  patient?s mobility assessment. ?Mobility Assessment (last 72 hours)   ? ? Mobility Assessment   ? ? [place] Name 07/27/21 2205 07/27/21 0823 07/26/21 2333 07/26/21  0818  ?  ? Does patient have an order for bedrest or is patient medically unstable Yes- Bedfast (Level 1) - Complete Yes- Bedfast (Level 1) - Complete Yes- Bedfast (Level 1) - Complete Yes- Bedfast (Level 1) - Complete   ? What is the highest level of mobility based on the progressive mobility assessment? -- Level 3 (Stands with assist) - Balance while standing  and cannot march in place -- Level 3 (Stands with assist) - Balance while standing  and cannot march in place   ? ?  ?  ? ?  ?Leukocytosis ?Not associated with fever or any other symptoms including on clinical exam ?Continue to follow ? 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 ?Continue Eliquis  ?  ?History of DVT (deep vein thrombosis) ?On Eliquis ?  ?Leukocytosis ?-Secondary to stress demargination from steroid ?  ?Normocytic anemia ?Chronic.  Hemoglobin 11.1 g/dL which appears near patient's baseline ?-Continue to monitor ?  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   ?-Continue oral Decadron as recommended by neurology and neurosurgical team ?-Palliative care consulted following-patient declined adjuvant and hospice ?-Due to metastatic cancer to the brain patient has developed cognitive impairment and has been evaluated by the psychiatric team and no longer lacks capacity ?  ?Vasogenic brain edema  ?Continue Decadron per neurosurgery recommendation ?-Currently on Decadron and Keppra ?  ?Diet-controlled type 2 diabetes mellitus ([place]) ?Stable CBGs ? Last hemoglobin A1c 6.5 on 06/04/2021.   ?-Continue Semglee 20 units at bedtime.  Continues to have intermittent  issues with hyperglycemia after lunch especially if eats a large breakfast so we will add Semglee 10 units daily.  Hopefully this will cut down on requirement for SSI ?-Continue SSI ?  ? ? ? ?Data Reviewed: ?Basic Metabolic Panel: ?Recent Labs  ?Lab 07/23/21 ?0157 07/28/21 ?3419  ?NA 140 136  ?K 4.3 4.3  ?CL 109 105  ?CO2 25 24  ?GLUCOSE 144* 259*  ?BUN 29* 34*  ?CREATININE 0.68 1.01*  ?CALCIUM 8.6* 8.5*  ? ?Liver Function Tests: ?Recent Labs  ?Lab 07/28/21 ?6222  ?AST 23  ?ALT 51*  ?ALKPHOS 58  ?BILITOT 0.5  ?PROT 4.5*  ?ALBUMIN 2.0*  ? ?No results for input(s): LIPASE, AMYLASE in the last 168 hours. ?No results for input(s): AMMONIA in the last 168 hours. ?CBC: ?Recent Labs  ?Lab 07/23/21 ?0157 07/28/21 ?9798  ?WBC 14.0* 9.0  ?NEUTROABS 11.0*  --   ?HGB 10.9* 10.7*  ?HCT 33.8* 33.9*  ?MCV 89.2 90.2  ?PLT 233 210  ? ?Cardiac Enzymes: ?No results for input(s): CKTOTAL, CKMB, CKMBINDEX, TROPONINI in the last 168 hours. ?BNP (last 3 results) ?No results for input(s): BNP in the last 8760 hours. ? ?ProBNP (last 3 results) ?No results for input(s): PROBNP in the last 8760 hours. ? ?CBG: ?Recent Labs  ?Lab 07/27/21 ?1135 07/27/21 ?1205 07/27/21 ?1648 07/27/21 ?2033 07/28/21 ?0803  ?GLUCAP 247* 269* 112* 181* 163*  ? ? ?No results found for this or any previous visit (from the  past 240 hour(s)).  ? ?Studies: ?No results found. ? ?Scheduled Meds: ? amitriptyline  50 mg Oral QHS  ? apixaban  5 mg Oral BID  ? atorvastatin  40 mg Oral QPM  ? dexamethasone  4 mg Oral Q12H  ? famotidine  20 mg Oral Daily  ? insulin aspart  0-15 Units Subcutaneous TID WC  ? insulin aspart  5 Units Subcutaneous TID WC  ? insulin glargine-yfgn  20 Units Subcutaneous QHS  ? levETIRAcetam  500 mg Oral BID  ? sodium chloride flush  3 mL Intravenous Q12H  ? vitamin B-12  1,000 mcg Oral Daily  ? ?Continuous Infusions: ? ?Principal Problem: ?  Fall at home secondary to weakness ?Active Problems: ?  Diet-controlled type 2 diabetes mellitus ([place]) ?   Vasogenic brain edema ([place]) ?  Chronic anticoagulation ?  Non-small cell lung cancer metastatic to brain [facility]) with vasogenic brain edema ?  Normocytic anemia ?  Leukocytosis ?  History of DVT (deep vein th

## 2021-07-28 NOTE — Progress Notes (Signed)
PT Cancellation Note ? ?Patient Details ?Name: Katelyn Kennedy ?MRN: [id number] ?DOB: 27-Aug-1945 ? ? ?Cancelled Treatment:    Reason Eval/Treat Not Completed: Fatigue/lethargy limiting ability to participate;Other (comment) (active conflicts with other events).  Follow up with pt on next tx day to get her up as tolerated. ? ? ?Ramond Dial ?07/28/2021, 5:18 PM ? ?Mee Hives, PT PhD ?Acute Rehab Dept. Number: [facility] [phone] and [place] [phone] ? ?

## 2021-07-28 NOTE — Progress Notes (Signed)
?                                                                                                                                                     ?                                                   ?Palliative Medicine Progress Note  ? ?Patient Name: Katelyn Kennedy       Date: 07/28/2021 ?DOB: 08-Feb-1946  Age: 76 y.o. MRN#: [id number] ?Attending Physician: Terrilee Croak, MD ?Primary Care Physician: [name], MD ?Admit Date: 07/07/2021 ? ? ? ?HPI/Patient Profile: ?76 y.o. 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 She had recently been tapered off Decadron.  ?In the ED, chest x-ray showed ill-defined opacity of the perihilar right lung and right lung base increased in prominence concerning for superimposed airspace disease or recurrent tumor. CT of the head showed extensive dural based hemorrhagic metastatic disease without significant change in extensive vasogenic edema or midline shift. Admitted to [facility]. ?  ?Regarding oncology history, she was originally diagnosed as stage IIIa in February 2019. She is status post concurrent chemo and radiation (last dose 07/2017) and status post immunotherapy (last dose May 2020). She is also status craniotomy July 2022.   ? ? ?Subjective: ?Chart reviewed.  Patient continues to require 2+ assist for any mobility.  She has been denied for rehab.  She does not have financial means to pay for SNF placement.  Medicaid approval pending.  ? ?She was seen by psychiatry on 07/15/2021 and deemed not to have capacity to decide to discharge home AMA. ? ?I went to see patient at bedside.  She is alert and has no acute complaints.  She continues to have very poor insight into her functional deficits and overall medical  situation. ? ? ?Objective: ? ?Physical Exam ?Vitals reviewed.  ?Constitutional:   ?   General: She is not in acute distress. ?   Appearance: She is ill-appearing.  ?Pulmonary:  ?   Effort: Pulmonary effort is normal.  ?Neurological:  ?   Mental Status: She is alert.  ?   Motor: Weakness present.  ?Psychiatric:     ?   Cognition and Memory: Cognition is impaired. Memory is impaired.  ?         ? ?Vital Signs: BP 115/66 (BP Location: Right Arm)   Pulse 93   Temp 97.8 ?F (36.6 ?C) (Oral)   Resp  19   Ht 4\' 10"  (1.473 m)   Wt 56.4 kg   SpO2 95%   BMI 25.99 kg/m?  ?SpO2: SpO2: 95 % ?O2 Device: O2 Device: Room Air ?O2 Flow Rate:   ? ? ? ?LBM: Last BM Date : 07/26/21 ? ?   ?Palliative Assessment/Data: PPS 30-40% ? ? ? ? ?Palliative Medicine Assessment & Plan  ? ?Assessment: ?Principal Problem: ?  Fall at home secondary to weakness ?Active Problems: ?  Diet-controlled type 2 diabetes mellitus ([place]) ?  Vasogenic brain edema ([place]) ?  Chronic anticoagulation ?  Non-small cell lung cancer metastatic to brain [facility]) with vasogenic brain edema ?  Normocytic anemia ?  Leukocytosis ?  History of DVT (deep vein thrombosis) ?  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  ? ?Recommendations/Plan: ?DNR/DNI as previously documented ?Continue all current interventions ?Patient's 2 daughters will make medical decisions for her when needed ?PMT will continue to follow peripherally ? ? ?Prognosis: ? < 6 months ? ?Discharge Planning: ?To Be Determined ? ? ? ?Thank you for allowing the Palliative Medicine Team to assist in the care of this patient. ? ? ?MDM - Moderate ? ? ?Lavena Bullion, NP ? ? ?Please contact Palliative Medicine Team phone at [phone] for questions and concerns.  ?For individual providers, please see [facility]. ? ? ? ? ? ?

## 2021-07-29 DIAGNOSIS — R531 Weakness: Secondary | ICD-10-CM | POA: Diagnosis not present

## 2021-07-29 LAB — GLUCOSE, CAPILLARY
Glucose-Capillary: 194 mg/dL — ABNORMAL HIGH (ref 70–99)
Glucose-Capillary: 310 mg/dL — ABNORMAL HIGH (ref 70–99)
Glucose-Capillary: 152 mg/dL — ABNORMAL HIGH (ref 70–99)
Glucose-Capillary: 123 mg/dL — ABNORMAL HIGH (ref 70–99)

## 2021-07-29 MED ORDER — OXYCODONE HCL 5 MG PO TABS: 5.0000 mg | ORAL_TABLET | ORAL | Status: DC

## 2021-07-29 MED ORDER — METHOCARBAMOL 750 MG PO TABS: 750.0000 mg | ORAL_TABLET | Freq: Three times a day (TID) | ORAL | Status: DC

## 2021-07-29 NOTE — Progress Notes (Signed)
Patient is currently waiting on a pending Medicaid number before she can be placed for LTC at a SNF. ? ?Katelyn Kennedy, MSW, LCSW ?Transitions of Care  Clinical Social Worker II ?[phone] ? ?

## 2021-07-29 NOTE — Progress Notes (Signed)
Physical Therapy Treatment ?Patient Details ?Name: Katelyn Kennedy ?MRN: [id number] ?DOB: 02-13-1946 ?Today's Date: 07/29/2021 ? ? ?History of Present Illness Pt is a 76 y.o. female admitted 07/07/21 after fall out of bed with reports of worsening L-side weakness related to brain metastases with prior resection. Head CT showing continued presence of brain mets with vasogenic edema and leftward shift of the brain. 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 ? ?  ?PT Comments  ? ? Pt received supine and agreeable to session. Pt able to come to sitting EOB with light min assist to elevate trunk. Pt requiring mod assist to come to standing to power up and steady once standing as pt with tendency for posterior lean and inattention to left. Pt able to take steps from EOB to recliner with mod assist and heavy cueing for each step to facilitate LLE movement. Pt agreeable to time up in recliner at end of session. Pt continues to benefit from skilled PT services to progress toward functional mobility goals.   ?  ?Recommendations for follow up therapy are one component of a multi-disciplinary discharge planning process, led by the attending physician.  Recommendations may be updated based on patient status, additional functional criteria and insurance authorization. ? ?Follow Up Recommendations ? Skilled nursing-short term rehab (<3 hours/day) ?  ?  ?Assistance Recommended at Discharge Frequent or constant Supervision/Assistance  ?Patient can return home with the following A lot of help with bathing/dressing/bathroom;Assistance with cooking/housework;Direct supervision/assist for financial management;Direct supervision/assist for medications management;Assist for transportation;Help with stairs or ramp for entrance;Two people to help with walking and/or transfers ?  ?Equipment Recommendations ? Wheelchair (measurements PT);Wheelchair cushion  (measurements PT);Hospital bed;Other (comment)  ?  ?Recommendations for Other Services   ? ? ?  ?Precautions / Restrictions Precautions ?Precautions: Fall;Other (comment) ?Precaution Comments: L side weakness and inattention ?Restrictions ?Weight Bearing Restrictions: No  ?  ? ?Mobility ? Bed Mobility ?Overal bed mobility: Needs Assistance ?Bed Mobility: Supine to Sit ?  ?  ?Supine to sit: Min assist ?  ?  ?General bed mobility comments: min a to elevate trunk ?  ? ?Transfers ?Overall transfer level: Needs assistance ?Equipment used: Rolling walker (2 wheels) ?Transfers: Sit to/from Stand ?Sit to Stand: Mod assist ?  ?  ?  ?  ?  ?General transfer comment: mod assist to come to standing and steady once up, x2 throughout session ?  ? ?Ambulation/Gait ?Ambulation/Gait assistance: Mod assist ?Gait Distance (Feet): 5 Feet ?Assistive device: Rolling walker (2 wheels) ?Gait Pattern/deviations: Decreased stride length, Knees buckling, Knee flexed in stance - left, Trunk flexed ?  ?  ?  ?General Gait Details: steps from EOB to recliner with cues for each step and assist for walker management, stops to re-cue for posture and walker proximity; L lateral lean throughout with L knee and hip flexed and rotated, posterior lean ? ? ?Stairs ?  ?  ?  ?  ?  ? ? ?Wheelchair Mobility ?  ? ?Modified Rankin (Stroke Patients Only) ?  ? ? ?  ?Balance Overall balance assessment: Needs assistance ?Sitting-balance support: Feet supported ?Sitting balance-Leahy Scale: Fair ?Sitting balance - Comments: min guard assist to maintain sitting balance. ?Postural control: Left lateral lean ?Standing balance support: Bilateral upper extremity supported ?Standing balance-Leahy Scale: Poor ?Standing balance comment: mod A with walker for balance ?  ?  ?  ?  ?  ?  ?  ?  ?  ?  ?  ?  ? ?  ?  Cognition Arousal/Alertness: Awake/alert ?Behavior During Therapy: Flat affect ?Overall Cognitive Status: Impaired/Different from baseline ?Area of Impairment:  Orientation, Attention, Memory, Following commands, Safety/judgement, Awareness, Problem solving ?  ?  ?  ?  ?  ?  ?  ?  ?Orientation Level: Disoriented to, Time, Situation ?Current Attention Level: Sustained ?Memory: Decreased short-term memory ?Following Commands: Follows one step commands with increased time, Follows one step commands inconsistently ?Safety/Judgement: Decreased awareness of safety, Decreased awareness of deficits ?Awareness: Intellectual ?Problem Solving: Slow processing, Requires verbal cues, Decreased initiation ?General Comments: frequent cues for mobility and posture during mobility ?  ?  ? ?  ?Exercises General Exercises - Lower Extremity ?Ankle Circles/Pumps: AROM, Both, 10 reps, Seated ? ?  ?General Comments   ?  ?  ? ?Pertinent Vitals/Pain Pain Assessment ?Pain Assessment: No/denies pain  ? ? ?Home Living   ?  ?  ?  ?  ?  ?  ?  ?  ?  ?   ?  ?Prior Function    ?  ?  ?   ? ?PT Goals (current goals can now be found in the care plan section) Acute Rehab PT Goals ?Patient Stated Goal: to go home ?PT Goal Formulation: With patient ?Time For Goal Achievement: 07/29/21 ? ?  ?Frequency ? ? ? Min 2X/week ? ? ? ?  ?PT Plan Current plan remains appropriate  ? ? ?Co-evaluation   ?  ?  ?  ?  ? ?  ?AM-PAC PT "6 Clicks" Mobility   ?Outcome Measure ? Help needed turning from your back to your side while in a flat bed without using bedrails?: A Lot ?Help needed moving from lying on your back to sitting on the side of a flat bed without using bedrails?: A Lot ?Help needed moving to and from a bed to a chair (including a wheelchair)?: Total ?Help needed standing up from a chair using your arms (e.g., wheelchair or bedside chair)?: Total ?Help needed to walk in hospital room?: Total ?Help needed climbing 3-5 steps with a railing? : Total ?6 Click Score: 8 ? ?  ?End of Session Equipment Utilized During Treatment: Gait belt ?Activity Tolerance: Patient tolerated treatment well ?Patient left: in chair;with call  bell/phone within reach ?Nurse Communication: Mobility status ?PT Visit Diagnosis: Unsteadiness on feet (R26.81);Muscle weakness (generalized) (M62.81);Repeated falls (R29.6);History of falling (Z91.81) ?  ? ? ?Time: 8768-1157 ?PT Time Calculation (min) (ACUTE ONLY): 21 min ? ?Charges:  $Therapeutic Activity: 8-22 mins          ?          ?Audry Riles. PTA ?Acute Rehabilitation Services ?Office: [phone] ? ? ? ?Betsey Holiday Illona Bulman ?07/29/2021, 1:01 PM ? ?

## 2021-07-29 NOTE — Progress Notes (Signed)
Mobility Specialist Progress Note: ? ? 07/29/21 1338  ?Mobility  ?Activity Ambulated with assistance in room;Transferred from chair to bed  ?Level of Assistance Moderate assist, patient does 50-74%  ?Assistive Device Front wheel walker  ?Distance Ambulated (ft) 4 ft  ?Activity Response Tolerated fair  ?$Mobility charge 1 Mobility  ? ?Pt received in chair asking to go back to bed. No complaints of pain. ModA to stand and to step to bed. Left in bed with call bell in reach and all needs met. ? ?Katelyn Kennedy ?Mobility Specialist ?Primary Phone [phone] ? ?

## 2021-07-29 NOTE — Progress Notes (Signed)
[facility] ?PROGRESS NOTE ? ?NAKIYA RALLIS MWN:[id number] DOB: [birth year] DOA: 07/07/2021 ?PCP: [name], MD ? ?Status: ?Remains inpatient appropriate because:  ?Unsafe discharge plan-patient continues to require 2+ assist for any mobility.  Patient was living with elderly aunts prior to admission and would need to have significant improvement in mobility before can return to that home setting ?Medicaid has been applied for.  Patient's retirement funding is not enough to pay for SNF alone.  Patient does not meet qualifications for rehab so insurance has denied.  Awaiting Medicaid approval before can place in long-term SNF ? ?Barriers to discharge: ?Social: ?Family at this time unable to provide care given patient's significant debility ? ?Clinical: ?Continued debility ?Patient has been deemed to lack capacity by the psychiatric team ? ?Level of care:  ?Med-Surg ? ? ?Code Status: DNR ?Family Communication: Patient only ?DVT prophylaxis: Eliquis ?COVID vaccination status: Pfizer 08/11/2019, 10/11/2019 ? ?HPI: ? 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  Right frontal mass was diagnosed in July 2022, last chemoradiation was 03/14/2021.  CT on 12/19 revealed multiple intracranial metastatic lesions with some hemorrhagic formation.  Patient underwent staging CT of the chest on 12/19 on 1/26 that revealed PE as well as additional nodules.  Oncologist spoke with neurosurgeon who cleared patient for IV heparin during that hospitalization.  DVT/PE on Eliquis and DM 2 was admitted 07/07/2021 after fall.  Work-up revealed that fall was likely secondary to left-sided weakness from her brain mets.  She had recently been tapered off Decadron.  Patient was restarted on Decadron.  Work-up including CT of head noted extensive dural based hemorrhagic metastatic disease. ? ?Subjective: ?No specific complaints.  Sitting up in bed eating breakfast per  usual. ? ?Objective: ?Vitals:  ? 07/29/21 0624 07/29/21 0741  ?BP: (!) 103/51 (!) 92/48  ?Pulse: 91 98  ?Resp: 18 17  ?Temp: 98.1 ?F (36.7 ?C) (!) 97.3 ?F (36.3 ?C)  ?SpO2: 96% 98%  ? ? ?Intake/Output Summary (Last 24 hours) at 07/29/2021 0845 ?Last data filed at 07/28/2021 1900 ?Gross per 24 hour  ?Intake 480 ml  ?Output 250 ml  ?Net 230 ml  ? ?Filed Weights  ? 07/07/21 0238 07/08/21 0326  ?Weight: (P) 57.6 kg 56.4 kg  ? ? ?Exam: ? ?Constitutional: NAD, calm, comfortable ?Respiratory: Room air, lung sounds are clear posteriorly, normal respiratory effort ?Cardiovascular: Regular pulse, no extremity edema.  S1-S2, normotensive ?Abdomen: no tenderness, abdomen soft, bowel sounds positive. LBM 4/18-eating well ?Neurologic: cranial nerves II through XII grossly intact.  Moving all extremities equally but weakly with generalized strength 3+-4/5.  Sensation intact ?Psychological: alert and oriented x 3.  Short-term memory deficits and even slightly more advanced executive skills are impaired and she lacks capacity to make independent decisions.  Normal mood.  ? ? ?Assessment/Plan: ?* Fall at home secondary to weakness and known metastatic lesion ?-Patient presents after having a fall at home with reports of worsening weakness.  She had just recently been tapered off of Decadron.   ?-Continue PT and OT ?-Transitions of care consulted -assisting with placement-continues to require significant assistance with mobility.  Patient would have to have marked improvement in her mobility before she could be discharged home with 2 elderly aunts ?-TOC/servant Center have assisted with Medicaid application and awaiting for a number.  Once number received can proceed with placement ?-Upon review of recent encounters patient has been hospitalized 5 times including this admission since November 2022 ? ? ?  A physical therapy consult is indicated based on the patient?s mobility assessment. ?Mobility Assessment (last 72 hours)   ? ? Mobility  Assessment   ? ? [place] Name 07/29/21 0750 07/28/21 2300 07/27/21 2205 07/27/21 0823 07/26/21 2333  ? Does patient have an order for bedrest or is patient medically unstable No - Continue assessment No - Continue assessment Yes- Bedfast (Level 1) - Complete Yes- Bedfast (Level 1) - Complete Yes- Bedfast (Level 1) - Complete  ? What is the highest level of mobility based on the progressive mobility assessment? Level 3 (Stands with assist) - Balance while standing  and cannot march in place Level 3 (Stands with assist) - Balance while standing  and cannot march in place -- Level 3 (Stands with assist) - Balance while standing  and cannot march in place --  ? Is the above level different from baseline mobility prior to current illness? Yes - Recommend PT order Yes - Recommend PT order -- -- --  ? ?  ?  ? ?  ?Leukocytosis ?Not associated with fever or any other symptoms including on clinical exam ?Continue to follow ? 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 ?Continue Eliquis  ?  ?History of DVT (deep vein thrombosis) ?On Eliquis ?  ?Leukocytosis ?-Secondary to stress demargination from steroid ?  ?Normocytic anemia ?Chronic.  Hemoglobin 11.1 g/dL which appears near patient's baseline ?-Continue to monitor ?  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   ?-Continue oral Decadron as recommended by neurology and neurosurgical team ?-Palliative care consulted following-patient declined adjuvant and hospice ?-Due to metastatic cancer to the brain patient has developed cognitive impairment and has been evaluated by the psychiatric team  and no longer lacks capacity ?  ?Vasogenic brain edema  ?Continue Decadron per neurosurgery recommendation ?-Currently on Decadron and Keppra ?  ?Diet-controlled type 2 diabetes mellitus ([place]) ?Stable CBGs ? Last hemoglobin A1c 6.5 on 06/04/2021.   ?-Continue Semglee 20 units at bedtime.  Continues to have intermittent issues with hyperglycemia after lunch especially if eats a large breakfast so we will add Semglee 10 units daily.  Hopefully this will cut down on requirement for SSI ?-Continue SSI ?  ? ? ? ?Data Reviewed: ?Basic Metabolic Panel: ?Recent Labs  ?Lab 07/23/21 ?0157 07/28/21 ?2297  ?NA 140 136  ?K 4.3 4.3  ?CL 109 105  ?CO2 25 24  ?GLUCOSE 144* 259*  ?BUN 29* 34*  ?CREATININE 0.68 1.01*  ?CALCIUM 8.6* 8.5*  ? ?Liver Function Tests: ?Recent Labs  ?Lab 07/28/21 ?9892  ?AST 23  ?ALT 51*  ?ALKPHOS 58  ?BILITOT 0.5  ?PROT 4.5*  ?ALBUMIN 2.0*  ? ?No results for input(s): LIPASE, AMYLASE in the last 168 hours. ?No results for input(s): AMMONIA in the last 168 hours. ?CBC: ?Recent Labs  ?Lab 07/23/21 ?0157 07/28/21 ?1194  ?WBC 14.0* 9.0  ?NEUTROABS 11.0*  --   ?HGB 10.9* 10.7*  ?HCT 33.8* 33.9*  ?MCV 89.2 90.2  ?PLT 233 210  ? ?Cardiac Enzymes: ?No results for input(s): CKTOTAL, CKMB, CKMBINDEX, TROPONINI in the last 168 hours. ?BNP (last 3 results) ?No results for input(s): BNP in the last 8760 hours. ? ?  ProBNP (last 3 results) ?No results for input(s): PROBNP in the last 8760 hours. ? ?CBG: ?Recent Labs  ?Lab 07/28/21 ?0803 07/28/21 ?1208 07/28/21 ?1623 07/28/21 ?2059 07/29/21 ?0741  ?GLUCAP 163* 470* 171* 122* 194*  ? ? ?No results found for this or any previous visit (from the past 240 hour(s)).  ? ?Studies: ?No results found. ? ?Scheduled Meds: ? amitriptyline  50 mg Oral QHS  ? apixaban  5 mg Oral BID  ? atorvastatin  40 mg Oral QPM  ? dexamethasone  4 mg Oral Q12H  ? famotidine  20 mg Oral Daily  ? insulin aspart  0-15 Units Subcutaneous TID WC  ? insulin aspart  5 Units Subcutaneous TID WC  ? insulin  glargine-yfgn  10 Units Subcutaneous Daily  ? insulin glargine-yfgn  20 Units Subcutaneous QHS  ? levETIRAcetam  500 mg Oral BID  ? sodium chloride flush  3 mL Intravenous Q12H  ? vitamin B-12  1,000 mcg Oral

## 2021-07-30 DIAGNOSIS — R531 Weakness: Secondary | ICD-10-CM | POA: Diagnosis not present

## 2021-07-30 LAB — GLUCOSE, CAPILLARY
Glucose-Capillary: 90 mg/dL (ref 70–99)
Glucose-Capillary: 288 mg/dL — ABNORMAL HIGH (ref 70–99)
Glucose-Capillary: 179 mg/dL — ABNORMAL HIGH (ref 70–99)
Glucose-Capillary: 253 mg/dL — ABNORMAL HIGH (ref 70–99)

## 2021-07-30 MED ORDER — INSULIN GLARGINE-YFGN 100 UNIT/ML ~~LOC~~ SOLN
25.0000 [IU] | Freq: Every day | SUBCUTANEOUS | Status: DC
  Administered 2021-07-30: 25 [IU] via SUBCUTANEOUS
  Filled 2021-07-30 (×3): qty 0.25

## 2021-07-30 NOTE — Progress Notes (Signed)
Mobility Specialist Progress Note: ? ? 07/30/21 1030  ?Mobility  ?Activity Ambulated with assistance in room  ?Level of Assistance Minimal assist, patient does 75% or more  ?Assistive Device Front wheel walker  ?Distance Ambulated (ft) 4 ft  ?Activity Response Tolerated well  ?$Mobility charge 1 Mobility  ? ?Pt received in bed willing to participate in mobility. MinA to stand and step to recliner. Cues to step with left foot. Left in chair with call bell in reach and all needs met. ? ?Katelyn Kennedy ?Mobility Specialist ?Primary Phone [phone] ? ?

## 2021-07-30 NOTE — Progress Notes (Signed)
Mobility Specialist Progress Note: ? ? 07/30/21 1400  ?Mobility  ?Activity Ambulated with assistance in room;Transferred from chair to bed  ?Level of Assistance Minimal assist, patient does 75% or more  ?Assistive Device Front wheel walker  ?Distance Ambulated (ft) 4 ft  ?Activity Response Tolerated well  ?$Mobility charge 1 Mobility  ? ?Pt received in chair willing to participate in mobility. No complaints of pain. MinA to stand and step to recliner with cues to step with left foot. Left in bed with call bell in reach and all needs met.  ? ?Kem Hensen ?Mobility Specialist ?Primary Phone [phone] ? ?

## 2021-07-30 NOTE — Progress Notes (Signed)
?PROGRESS NOTE ? ?Katelyn Kennedy  ?DOB: 11/27/1945  ?PCP: [name], MD ?LAG:[id number]  ?DOA: 07/07/2021 ? LOS: 20 days  ?Hospital Day: 24 ? ?Brief narrative: ?Katelyn Kennedy is a 76 y.o. female with PMH significant for DM2, HTN, PVD, lung cancer with brain mets s/p resection and chemoradiation, DVT/PE on Eliquis. ?Patient was brought to the ED from home on 3/27 from home after she slid out of bed and fell to the floor. ?She was being taken care at home by family members over the years of 33 and their own physical limitations.  Family members were not able to pick her up when she fell and hence EMS was called and she was brought to the ED ?In the ED, CT scan of head (compared to CT scan and MRIs from last 2 months) showed extensive dural based hemorrhagic metastatic disease without significant change in extensive vasogenic edema or midline shift small volume extra-axial hemorrhage along the right frontal convexity with superimposed traumatic hemorrhage. ?Patient was admitted to hospital service ? ?Her hospitalization was prolonged because patient was in a phase of denial of the severity of her condition.  At one point patient tried to leave AMA but she did not have enough family support at home. Psychiatry service was consulted and patient was designated not to have capacity for decision.  Family members unable to take her home and provide any support. ?Pending placement plan ? ?Subjective: ?Patient was seen and examined this morning.   ?Sitting up in chair.  Not in distress. ? ?Principal Problem: ?  Fall at home secondary to weakness ?Active Problems: ?  Diet-controlled type 2 diabetes mellitus ([place]) ?  Vasogenic brain edema ([place]) ?  Chronic anticoagulation ?  Non-small cell lung cancer metastatic to brain [facility]) with vasogenic brain edema ?  Normocytic anemia ?  Leukocytosis ?  History of DVT (deep vein thrombosis) ?  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  ? ?Assessment and  Plan: ?Small traumatic subdural hemorrhage ?Non-small cell lung cancer ?Extensive hemorrhagic brain metastasis extensive vasogenic edema ?-Patient presented after a fall at home.   ?-Known history of lung cancer with brain mets s/p resection, chemoradiation and recent scans showing extensive hemorrhagic mets and vasogenic edema  ?-New CT head on admission showed superimposed small traumatic subdural hemorrhage. ?-Neurosurgery was consulted and patient was started on Decadron.  Continue Keppra for seizure prophylaxis ?-Palliative care consultation was obtained.  Patient declined hospice care. ?-Family #will take care at home.  Currently pending disposition. ? ?History of DVT/PE on chronic anticoagulation  ?-PE was diagnosed on 05/08/2021.   ?-Despite hemorrhagic metastasis in brain, patient had chosen to continue Eliquis.  She understands the high risk of intracranial bleeding.   ? ?Type II diabetes mellitus ?-A1c 6.5 in February 2023 ?-Currently on Lantus 10 units daily and 25 units nightly.  Continue sliding scale insulin with Accu-Cheks. ?Recent Labs  ?Lab 07/29/21 ?1150 07/29/21 ?1614 07/29/21 ?2138 07/30/21 ?0813 07/30/21 ?1136  ?GLUCAP 310* 152* 123* 90 288*  ? ?Goals of care ?  Code Status: DNR  ? ? ?Mobility: Limited mobility ? ?Nutritional status:  ?Body mass index is 25.99 kg/m?.  ?  ?  ? ? ? ? ?Diet:  ?Diet Order   ? ?       ?  Diet - low sodium heart healthy       ?  ?  Diet regular Room service appropriate? Yes; Fluid consistency: Thin  Diet effective now       ?  ? ?  ?  ? ?  ? ? ?  DVT prophylaxis:  ? ?apixaban (ELIQUIS) tablet 5 mg   ?Antimicrobials: None ?Fluid: None ?Consultants: None at this time ?Family Communication: Family member at bedside ? ?Status is: Inpatient ? ?Continue in-hospital care because: Needs better disposition plan. ?Level of care: Med-Surg  ? ?Dispo: The patient is from: Home ?             Anticipated d/c is to: Family members unable to take care of her at home.  Pending  placement ?             Patient currently is not medically stable to d/c. ?  Difficult to place patient No ? ? ? ? ?Infusions:  ? ? ?Scheduled Meds: ? amitriptyline  50 mg Oral QHS  ? apixaban  5 mg Oral BID  ? atorvastatin  40 mg Oral QPM  ? dexamethasone  4 mg Oral Q12H  ? famotidine  20 mg Oral Daily  ? insulin aspart  0-15 Units Subcutaneous TID WC  ? insulin aspart  5 Units Subcutaneous TID WC  ? insulin glargine-yfgn  10 Units Subcutaneous Daily  ? insulin glargine-yfgn  25 Units Subcutaneous QHS  ? levETIRAcetam  500 mg Oral BID  ? sodium chloride flush  3 mL Intravenous Q12H  ? vitamin B-12  1,000 mcg Oral Daily  ? ? ?PRN meds: ?acetaminophen **OR** acetaminophen, albuterol, docusate sodium, polyethylene glycol  ? ?Antimicrobials: ?Anti-infectives (From admission, onward)  ? ? None  ? ?  ? ? ?Objective: ?Vitals:  ? 07/30/21 0542 07/30/21 0811  ?BP: 118/70 111/69  ?Pulse: 90 93  ?Resp: 16 16  ?Temp: 98.1 ?F (36.7 ?C) 97.8 ?F (36.6 ?C)  ?SpO2: 99% 100%  ? ? ?Intake/Output Summary (Last 24 hours) at 07/30/2021 1358 ?Last data filed at 07/29/2021 2130 ?Gross per 24 hour  ?Intake 240 ml  ?Output --  ?Net 240 ml  ? ? ?Filed Weights  ? 07/07/21 0238 07/08/21 0326  ?Weight: (P) 57.6 kg 56.4 kg  ? ?Weight change:  ?Body mass index is 25.99 kg/m?.  ? ?Physical Exam: ?General exam: Pleasant, elderly Caucasian female.  Sitting up in chair.  Not in distress. ?Skin: No rashes, lesions or ulcers. ?HEENT: Atraumatic, normocephalic, no obvious bleeding ?Lungs: Clear to auscultation bilaterally ?CVS: Regular rate and rhythm, no murmur ?GI/Abd soft, nontender, nondistended, bowel sound present ?CNS: Alert, awake, oriented x3 ?Psychiatry: Sad affect ?Extremities: No pedal edema, no calf tenderness ? ?Data Review: I have personally reviewed the laboratory data and studies available. ? ?F/u labs ordered ?Unresulted Labs (From admission, onward)  ? ? None  ? ?  ? ? ?Signed, ?Terrilee Croak, MD ?[facility] ?07/30/2021 ? ? ? ? ? ? ? ? ? ? ? ? ?

## 2021-07-30 NOTE — Progress Notes (Signed)
OT Cancellation Note ? ?Patient Details ?Name: Katelyn Kennedy ?MRN: [id number] ?DOB: 07-20-1945 ? ? ?Cancelled Treatment:    Reason Eval/Treat Not Completed:  Pt not in room. Will continue to follow.  ? ?[name] ?07/30/2021, 2:48 PM ?Nestor Lewandowsky, OTR/L ?Acute Rehabilitation Services ?Pager: [phone] ?Office: [phone]  ?

## 2021-07-30 NOTE — Progress Notes (Signed)
Most of her elevated CBGs are in the morning. D/w Dr Pietro Cassis and we will titrate the PM dose of Semglee to 25 units today. ? ?Onnie Boer, PharmD, BCIDP, AAHIVP, CPP ?Infectious Disease Pharmacist ?07/30/2021 1:36 PM ? ? ?

## 2021-07-31 DIAGNOSIS — R531 Weakness: Secondary | ICD-10-CM | POA: Diagnosis not present

## 2021-07-31 LAB — GLUCOSE, CAPILLARY
Glucose-Capillary: 130 mg/dL — ABNORMAL HIGH (ref 70–99)
Glucose-Capillary: 58 mg/dL — ABNORMAL LOW (ref 70–99)
Glucose-Capillary: 330 mg/dL — ABNORMAL HIGH (ref 70–99)
Glucose-Capillary: 218 mg/dL — ABNORMAL HIGH (ref 70–99)
Glucose-Capillary: 140 mg/dL — ABNORMAL HIGH (ref 70–99)

## 2021-07-31 MED ORDER — INSULIN GLARGINE-YFGN 100 UNIT/ML ~~LOC~~ SOLN
15.0000 [IU] | Freq: Every day | SUBCUTANEOUS | Status: DC
  Administered 2021-07-31 – 2021-08-13 (×14): 15 [IU] via SUBCUTANEOUS
  Filled 2021-07-31 (×15): qty 0.15

## 2021-07-31 NOTE — Progress Notes (Signed)
CBG rechecked 130 ?

## 2021-07-31 NOTE — Progress Notes (Signed)
Day NT informed nurse pt CBG is 58. Patient is alert and oriented orange juice given breakfast order placed and will recheck. Call bell within reach. ?

## 2021-07-31 NOTE — Progress Notes (Signed)
Mobility Specialist Progress Note: ? ? 07/31/21 1018  ?Mobility  ?Activity Ambulated with assistance in room;Transferred from chair to bed  ?Level of Assistance Minimal assist, patient does 75% or more  ?Assistive Device Front wheel walker  ?Distance Ambulated (ft) 4 ft  ?Activity Response Tolerated well  ?$Mobility charge 1 Mobility  ? ?PT received in bed willing to participate in mobility. No complaints of pain. MinA to stand and then contact guard to step to recliner. Required cues to move left foot. Left in chair with call bell in reach and all needs met.  ? ?Ari Bernabei ?Mobility Specialist ?Primary Phone [phone] ? ?

## 2021-07-31 NOTE — Progress Notes (Signed)
?PROGRESS NOTE ? ?Katelyn Kennedy  ?DOB: 02/27/1946  ?PCP: Katelyn Sanders, MD ?SJG:[id number]  ?DOA: 07/07/2021 ? LOS: 21 days  ?Hospital Day: 25 ? ?Brief narrative: ?Katelyn Kennedy is a 76 y.o. female with PMH significant for DM2, HTN, PVD, lung cancer with brain mets s/p resection and chemoradiation, DVT/PE on Eliquis. ?Patient was brought to the ED from home on 3/27 from home after she slid out of bed and fell to the floor. ?She was being taken care at home by family members over the years of 42 and their own physical limitations.  Family members were not able to pick her up when she fell and hence EMS was called and she was brought to the ED ?In the ED, CT scan of head (compared to CT scan and MRIs from last 2 months) showed extensive dural based hemorrhagic metastatic disease without significant change in extensive vasogenic edema or midline shift small volume extra-axial hemorrhage along the right frontal convexity with superimposed traumatic hemorrhage. ?Patient was admitted to hospital service ? ?Her hospitalization was prolonged because patient was in a phase of denial of the severity of her condition.  At one point patient tried to leave AMA but she did not have enough family support at home. Psychiatry service was consulted and patient was designated not to have capacity for decision.  Family members unable to take her home and provide any support. ?Pending placement plan ? ?Subjective: ?Patient was seen and examined this morning.   ?Sitting up in chair.  Not in distress. ?Keeps on repeating, 'I'm going home.' ? ?Principal Problem: ?  Fall at home secondary to weakness ?Active Problems: ?  Diet-controlled type 2 diabetes mellitus ([place]) ?  Vasogenic brain edema ([place]) ?  Chronic anticoagulation ?  Non-small cell lung cancer metastatic to brain [facility]) with vasogenic brain edema ?  Normocytic anemia ?  Leukocytosis ?  History of DVT (deep vein thrombosis) ?  History of pulmonary embolism and DVT on chronic  anticoagulation ?  ? ?Assessment and Plan: ?Small traumatic subdural hemorrhage ?Non-small cell lung cancer ?Extensive hemorrhagic brain metastasis extensive vasogenic edema ?-Patient presented after a fall at home.   ?-Known history of lung cancer with brain mets s/p resection, chemoradiation and recent scans showing extensive hemorrhagic mets and vasogenic edema  ?-New CT head on admission showed superimposed small traumatic subdural hemorrhage. ?-Neurosurgery was consulted and patient was started on Decadron.  Continue Keppra for seizure prophylaxis ?-Palliative care consultation was obtained.  Patient declined hospice care. ?-Family unable will take care at home.  Currently pending disposition. ? ?History of DVT/PE on chronic anticoagulation  ?-PE was diagnosed on 05/08/2021.   ?-Despite hemorrhagic metastasis in brain, patient had chosen to continue Eliquis.  She understands the high risk of intracranial bleeding.   ? ?Type II diabetes mellitus ?Hypoglycemia ?-A1c 6.5 in February 2023 ?-Currently on Lantus 10 units daily and 25 units nightly.  Patient had a low blood sugar episode of 58 this morning.  Reduce nighttime Lantus to 15 units.  Continue sliding scale insulin with Accu-Cheks. ?Recent Labs  ?Lab 07/30/21 ?1654 07/30/21 ?2038 07/31/21 ?6546 07/31/21 ?5035 07/31/21 ?1222  ?GLUCAP 179* 253* 58* 130* 330*  ? ? ?Goals of care ?  Code Status: DNR  ? ? ?Mobility: Limited mobility ? ?Nutritional status:  ?Body mass index is 25.99 kg/m?.  ?  ?  ? ? ? ? ?Diet:  ?Diet Order   ? ?       ?  Diet -  low sodium heart healthy       ?  ?  Diet regular Room service appropriate? Yes; Fluid consistency: Thin  Diet effective now       ?  ? ?  ?  ? ?  ? ? ?DVT prophylaxis:  ? ?apixaban (ELIQUIS) tablet 5 mg   ?Antimicrobials: None ?Fluid: None ?Consultants: None at this time ?Family Communication: Family member not at bedside ? ?Status is: Inpatient ? ?Continue in-hospital care because: Needs better disposition plan. ?Level  of care: Med-Surg  ? ?Dispo: The patient is from: Home ?             Anticipated d/c is to: Family members unable to take care of her at home.  Pending placement ?             Patient currently is not medically stable to d/c. ?  Difficult to place patient No ? ? ? ? ?Infusions:  ? ? ?Scheduled Meds: ? amitriptyline  50 mg Oral QHS  ? apixaban  5 mg Oral BID  ? atorvastatin  40 mg Oral QPM  ? dexamethasone  4 mg Oral Q12H  ? famotidine  20 mg Oral Daily  ? insulin aspart  0-15 Units Subcutaneous TID WC  ? insulin aspart  5 Units Subcutaneous TID WC  ? insulin glargine-yfgn  10 Units Subcutaneous Daily  ? insulin glargine-yfgn  15 Units Subcutaneous QHS  ? levETIRAcetam  500 mg Oral BID  ? sodium chloride flush  3 mL Intravenous Q12H  ? vitamin B-12  1,000 mcg Oral Daily  ? ? ?PRN meds: ?acetaminophen **OR** acetaminophen, albuterol, docusate sodium, polyethylene glycol  ? ?Antimicrobials: ?Anti-infectives (From admission, onward)  ? ? None  ? ?  ? ? ?Objective: ?Vitals:  ? 07/31/21 0749 07/31/21 1246  ?BP: 117/66 117/72  ?Pulse: 90 (!) 102  ?Resp: 18 16  ?Temp: 98.1 ?F (36.7 ?C) 97.7 ?F (36.5 ?C)  ?SpO2: 97% 98%  ? ? ?Intake/Output Summary (Last 24 hours) at 07/31/2021 1332 ?Last data filed at 07/31/2021 0505 ?Gross per 24 hour  ?Intake 240 ml  ?Output 1400 ml  ?Net -1160 ml  ? ? ? ?Filed Weights  ? 07/07/21 0238 07/08/21 0326  ?Weight: (P) 57.6 kg 56.4 kg  ? ?Weight change:  ?Body mass index is 25.99 kg/m?.  ? ?Physical Exam: ?General exam: Pleasant, elderly Caucasian female.  Sitting up in chair.  Not in distress. ?Skin: No rashes, lesions or ulcers. ?HEENT: Atraumatic, normocephalic, no obvious bleeding ?Lungs: Clear to auscultation bilaterally ?CVS: Regular rate and rhythm, no murmur ?GI/Abd soft, nontender, nondistended, bowel sound present ?CNS: Alert, awake, oriented x3 ?Psychiatry: Sad affect ?Extremities: No pedal edema, no calf tenderness ? ?Data Review: I have personally reviewed the laboratory data and  studies available. ? ?F/u labs ordered ?Unresulted Labs (From admission, onward)  ? ? None  ? ?  ? ? ?Signed, ?Terrilee Croak, MD ?[facility] ?07/31/2021 ? ? ? ? ? ? ? ? ? ? ? ? ?

## 2021-07-31 NOTE — Progress Notes (Signed)
Physical Therapy Treatment ?Patient Details ?Name: Katelyn Kennedy ?MRN: [id number] ?DOB: 1946/04/13 ?Today's Date: 07/31/2021 ? ? ?History of Present Illness Pt is a 76 y.o. female admitted 07/07/21 after fall out of bed with reports of worsening L-side weakness related to brain metastases with prior resection. Head CT showing continued presence of brain mets with vasogenic edema and leftward shift of the brain. 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 ? ?  ?PT Comments  ? ? Pt received OOB in recliner and agreeable to session. Pt able to come to standing with mod assist to power up and steady once standing. Pt stating increased left sided weakness this session, with noted decrease in ability to clear LLE during ambulation and swing phase. Pt requiring mod a to ambulate from chair to bed with cues for each step to advance LE's. Pt continues to benefit from skilled PT services to progress toward functional mobility goals.  ?  ?Recommendations for follow up therapy are one component of a multi-disciplinary discharge planning process, led by the attending physician.  Recommendations may be updated based on patient status, additional functional criteria and insurance authorization. ? ?Follow Up Recommendations ? Skilled nursing-short term rehab (<3 hours/day) ?  ?  ?Assistance Recommended at Discharge Frequent or constant Supervision/Assistance  ?Patient can return home with the following A lot of help with bathing/dressing/bathroom;Assistance with cooking/housework;Direct supervision/assist for financial management;Direct supervision/assist for medications management;Assist for transportation;Help with stairs or ramp for entrance;Two people to help with walking and/or transfers ?  ?Equipment Recommendations ? Wheelchair (measurements PT);Wheelchair cushion (measurements PT);Hospital bed;Other (comment)  ?  ?Recommendations for Other  Services   ? ? ?  ?Precautions / Restrictions Precautions ?Precautions: Fall;Other (comment) ?Precaution Comments: L side weakness and inattention ?Restrictions ?Weight Bearing Restrictions: No  ?  ? ?Mobility ? Bed Mobility ?Overal bed mobility: Needs Assistance ?Bed Mobility: Sit to Supine ?  ?  ?Supine to sit: Mod assist ?  ?  ?General bed mobility comments: to bring LE onto/in bed and to reposition ?  ? ?Transfers ?Overall transfer level: Needs assistance ?Equipment used: Rolling walker (2 wheels) ?Transfers: Sit to/from Stand ?Sit to Stand: Mod assist ?  ?  ?  ?  ?  ?General transfer comment: mod assist to come to standing and steady once up ?  ? ?Ambulation/Gait ?Ambulation/Gait assistance: Mod assist ?Gait Distance (Feet): 5 Feet ?Assistive device: Rolling walker (2 wheels) ?Gait Pattern/deviations: Decreased stride length, Knees buckling, Knee flexed in stance - left, Trunk flexed ?  ?  ?  ?General Gait Details: steps from recliner to EOB with cues for each step and assist for walker management, stops to re-cue for posture and walker proximity; L lateral lean throughout with L knee and hip flexed and rotated ? ? ?Stairs ?  ?  ?  ?  ?  ? ? ?Wheelchair Mobility ?  ? ?Modified Rankin (Stroke Patients Only) ?  ? ? ?  ?Balance Overall balance assessment: Needs assistance ?Sitting-balance support: Feet supported ?Sitting balance-Leahy Scale: Fair ?Sitting balance - Comments: min guard assist to maintain sitting balance. ?Postural control: Left lateral lean ?Standing balance support: Bilateral upper extremity supported ?Standing balance-Leahy Scale: Poor ?Standing balance comment: mod A with walker for balance ?  ?  ?  ?  ?  ?  ?  ?  ?  ?  ?  ?  ? ?  ?Cognition Arousal/Alertness: Awake/alert ?Behavior During Therapy: Flat affect ?Overall  Cognitive Status: Impaired/Different from baseline ?Area of Impairment: Orientation, Attention, Memory, Following commands, Safety/judgement, Awareness, Problem solving ?  ?  ?  ?   ?  ?  ?  ?  ?Orientation Level: Disoriented to, Time, Situation ?Current Attention Level: Sustained ?Memory: Decreased short-term memory ?Following Commands: Follows one step commands with increased time, Follows one step commands inconsistently ?Safety/Judgement: Decreased awareness of safety, Decreased awareness of deficits ?Awareness: Intellectual ?Problem Solving: Slow processing, Requires verbal cues, Decreased initiation ?General Comments: frequent cues for mobility and posture during mobility ?  ?  ? ?  ?Exercises Other Exercises ?Other Exercises: B shoulder extension and flexion with deep breathing in supine x10 for stretching ? ?  ?General Comments   ?  ?  ? ?Pertinent Vitals/Pain Pain Assessment ?Pain Assessment: Faces ?Faces Pain Scale: Hurts a little bit ?Pain Location: left side, low back ?Pain Descriptors / Indicators: Discomfort, Sore ?Pain Intervention(s): Repositioned, Limited activity within patient's tolerance  ? ? ?Home Living   ?  ?  ?  ?  ?  ?  ?  ?  ?  ?   ?  ?Prior Function    ?  ?  ?   ? ?PT Goals (current goals can now be found in the care plan section) Acute Rehab PT Goals ?Patient Stated Goal: to go home ?PT Goal Formulation: With patient ?Time For Goal Achievement: 07/29/21 ? ?  ?Frequency ? ? ? Min 2X/week ? ? ? ?  ?PT Plan Current plan remains appropriate  ? ? ?Co-evaluation   ?  ?  ?  ?  ? ?  ?AM-PAC PT "6 Clicks" Mobility   ?Outcome Measure ? Help needed turning from your back to your side while in a flat bed without using bedrails?: A Lot ?Help needed moving from lying on your back to sitting on the side of a flat bed without using bedrails?: A Lot ?Help needed moving to and from a bed to a chair (including a wheelchair)?: Total ?Help needed standing up from a chair using your arms (e.g., wheelchair or bedside chair)?: Total ?Help needed to walk in hospital room?: Total ?Help needed climbing 3-5 steps with a railing? : Total ?6 Click Score: 8 ? ?  ?End of Session Equipment Utilized  During Treatment: Gait belt ?Activity Tolerance: Patient tolerated treatment well ?Patient left: in bed;with call bell/phone within reach ?Nurse Communication: Mobility status ?PT Visit Diagnosis: Unsteadiness on feet (R26.81);Muscle weakness (generalized) (M62.81);Repeated falls (R29.6);History of falling (Z91.81) ?  ? ? ?Time: 7616-0737 ?PT Time Calculation (min) (ACUTE ONLY): 20 min ? ?Charges:  $Gait Training: 8-22 mins          ?          ? ?Audry Riles. PTA ?Acute Rehabilitation Services ?Office: [phone] ? ? ? ?Betsey Holiday Krista Som ?07/31/2021, 2:47 PM ? ?

## 2021-08-01 DIAGNOSIS — R531 Weakness: Secondary | ICD-10-CM | POA: Diagnosis not present

## 2021-08-01 LAB — GLUCOSE, CAPILLARY
Glucose-Capillary: 128 mg/dL — ABNORMAL HIGH (ref 70–99)
Glucose-Capillary: 328 mg/dL — ABNORMAL HIGH (ref 70–99)
Glucose-Capillary: 196 mg/dL — ABNORMAL HIGH (ref 70–99)
Glucose-Capillary: 88 mg/dL (ref 70–99)

## 2021-08-01 NOTE — Progress Notes (Signed)
Occupational Therapy Treatment ?Patient Details ?Name: Katelyn Kennedy ?MRN: [id number] ?DOB: 13-Nov-1945 ?Today's Date: 08/01/2021 ? ? ?History of present illness Pt is a 76 y.o. female admitted 07/07/21 after fall out of bed with reports of worsening L-side weakness related to brain metastases with prior resection. Head CT showing continued presence of brain mets with vasogenic edema and leftward shift of the brain. 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 ?  ?OT comments ? Pt OOB in chair. Able to complete sit to stand and take several steps with min A. Mod A for LB ADL tasks. Goals updated. Pt with decreased awareness of deficits as she states she is going to go home. DC Plan is custodial care at SNF. Will continue to follow and reassess goals as pt progresses  ? ?Recommendations for follow up therapy are one component of a multi-disciplinary discharge planning process, led by the attending physician.  Recommendations may be updated based on patient status, additional functional criteria and insurance authorization. ?   ?Follow Up Recommendations ? Skilled nursing-short term rehab (<3 hours/day)  ?  ?Assistance Recommended at Discharge Frequent or constant Supervision/Assistance  ?Patient can return home with the following ? Two people to help with walking and/or transfers;A lot of help with bathing/dressing/bathroom;Assistance with cooking/housework;Assistance with feeding;Direct supervision/assist for medications management;Direct supervision/assist for financial management;Assist for transportation;Help with stairs or ramp for entrance ?  ?Equipment Recommendations ? Other (comment) (defer to next venue)  ?  ?Recommendations for Other Services   ? ?  ?Precautions / Restrictions Precautions ?Precautions: Fall;Other (comment) ?Precaution Comments: L side weakness and inattention ?Restrictions ?Weight Bearing Restrictions: No   ? ? ?  ? ?Mobility Bed Mobility ?  ?  ?  ?  ?  ?  ?  ?General bed mobility comments: OOB in chair ?  ? ?Transfers ?Overall transfer level: Needs assistance ?Equipment used: Rolling walker (2 wheels) ?Transfers: Sit to/from Stand ?Sit to Stand: Min assist ?Stand pivot transfers: Mod assist ?  ?  ?  ?  ?General transfer comment: mod assist to come to standing and steady once up ?Transfer via Lift Equipment: Stedy ?  ?Balance Overall balance assessment: Needs assistance ?Sitting-balance support: Feet supported ?Sitting balance-Leahy Scale: Fair ?Sitting balance - Comments: min guard assist to maintain sitting balance. ?Postural control: Left lateral lean ?Standing balance support: Bilateral upper extremity supported ?Standing balance-Leahy Scale: Poor ?Standing balance comment: mod A with walker for balance ?  ?  ?  ?  ?  ?  ?  ?  ?  ?  ?  ?   ? ?ADL either performed or assessed with clinical judgement  ? ?ADL Overall ADL's : Needs assistance/impaired ?Eating/Feeding: Set up;Sitting ?  ?Grooming: Wash/dry hands;Sitting;Min guard ?  ?Upper Body Bathing: Sitting;Minimal assistance ?  ?Lower Body Bathing: Maximal assistance;Sit to/from stand ?Lower Body Bathing Details (indicate cue type and reason): standing with use of steady frame ?Upper Body Dressing : Sitting;Moderate assistance ?  ?Lower Body Dressing: Maximal assistance ?  ?Toilet Transfer: Minimal assistance;Rolling walker (2 wheels);BSC/3in1 ?  ?Toileting- Clothing Manipulation and Hygiene: Total assistance;Sit to/from stand ?Toileting - Clothing Manipulation Details (indicate cue type and reason): incontinenet ?  ?  ?Functional mobility during ADLs: Minimal assistance;Rolling walker (2 wheels) (sit - stand step) ?General ADL Comments: pt soiled upon arrival - focused on bathing in standing with only 1 UE supported ?  ? ?Extremity/Trunk Assessment Upper Extremity Assessment ?Upper Extremity Assessment:  Generalized weakness ?LUE Sensation: decreased  proprioception;decreased light touch ?LUE Coordination: decreased gross motor ?  ?Lower Extremity Assessment ?Lower Extremity Assessment: Defer to PT evaluation ?  ?  ?  ? ?Vision   ?Additional Comments: wears glasses ?  ?Perception Perception ?Perception: Impaired (L inattention - mild) ?  ?Praxis Praxis ?Praxis: Intact ?  ? ?Cognition Arousal/Alertness: Awake/alert ?Behavior During Therapy: Flat affect ?Overall Cognitive Status: Impaired/Different from baseline ?Area of Impairment: Orientation, Attention, Memory, Following commands, Safety/judgement, Awareness, Problem solving ?  ?  ?  ?  ?  ?  ?  ?  ?Orientation Level: Disoriented to, Time, Situation ?Current Attention Level: Sustained ?Memory: Decreased short-term memory ?Following Commands: Follows one step commands with increased time ?Safety/Judgement: Decreased awareness of safety, Decreased awareness of deficits ?Awareness: Emergent ?Problem Solving: Slow processing, Requires verbal cues, Decreased initiation ?  ?  ?  ?   ?Exercises Exercises: General Lower Extremity ? ?  ?Shoulder Instructions   ? ? ?  ?General Comments    ? ? ?Pertinent Vitals/ Pain       Pain Assessment ?Pain Assessment: Faces ?Faces Pain Scale: Hurts a little bit ?Pain Location: left side, low back ?Pain Descriptors / Indicators: Discomfort, Sore ?Pain Intervention(s): Limited activity within patient's tolerance ? ?Home Living   ?  ?  ?  ?  ?  ?  ?  ?  ?  ?  ?  ?  ?  ?  ?  ?  ?  ?  ? ?  ?Prior Functioning/Environment    ?  ?  ?  ?   ? ?Frequency ? Min 1X/week  ? ? ? ? ?  ?Progress Toward Goals ? ?OT Goals(current goals can now be found in the care plan section) ? Progress towards OT goals: Goals met and updated - see care plan ? ?Acute Rehab OT Goals ?OT Goal Formulation: With patient ?Time For Goal Achievement: 07/23/21 ?Potential to Achieve Goals: Fair ?ADL Goals ?Pt Will Perform Eating: with modified independence;sitting ?Pt Will Perform Grooming: with modified independence ?Pt Will  Perform Upper Body Bathing:  (met 4/21) ?Pt Will Perform Upper Body Dressing: with set-up;with supervision;sitting ?Pt Will Transfer to Toilet:  (met 4/21) ?Pt/caregiver will Perform Home Exercise Program: Left upper extremity;With minimal assist;With written HEP provided ?Additional ADL Goal #1:  (met 4/21)  ?Plan Discharge plan remains appropriate;Frequency needs to be updated   ? ?Co-evaluation ? ? ?   ?  ?  ?  ?  ? ?  ?AM-PAC OT "6 Clicks" Daily Activity     ?Outcome Measure ? ? Help from another person eating meals?: A Little ?Help from another person taking care of personal grooming?: A Little ?Help from another person toileting, which includes using toliet, bedpan, or urinal?: Total ?Help from another person bathing (including washing, rinsing, drying)?: A Lot ?Help from another person to put on and taking off regular upper body clothing?: A Lot ?Help from another person to put on and taking off regular lower body clothing?: Total ?6 Click Score: 12 ? ?  ?End of Session Equipment Utilized During Treatment: Other (comment);Rolling walker (2 wheels) ? ?OT Visit Diagnosis: Other abnormalities of gait and mobility (R26.89);Unsteadiness on feet (R26.81);Muscle weakness (generalized) (M62.81);Hemiplegia and hemiparesis;Other symptoms and signs involving cognitive function ?Hemiplegia - Right/Left: Left ?Hemiplegia - dominant/non-dominant: Non-Dominant ?Hemiplegia - caused by: Unspecified ?  ?Activity Tolerance Patient tolerated treatment well ?  ?Patient Left in chair;with chair alarm set;with call bell/phone within reach ?  ?Nurse Communication Mobility  status ?  ? ?   ? ?Time: 8003-4917 ?OT Time Calculation (min): 17 min ? ?Charges: OT General Charges ?$OT Visit: 1 Visit ?OT Treatments ?$Self Care/Home Management : 8-22 mins ? ?[facility], OT/L  ? ?Acute OT Clinical Specialist ?Acute Rehabilitation Services ?Pager [phone] ?Office [phone]  ? ?Katriona Schmierer,HILLARY ?08/01/2021, 3:41 PM ?

## 2021-08-01 NOTE — Progress Notes (Signed)
?PROGRESS NOTE ? ?Katelyn Kennedy  ?DOB: 1946/02/15  ?PCP: Katelyn Sanders, MD ?CVE:[id number]  ?DOA: 07/07/2021 ? LOS: 22 days  ?Hospital Day: 26 ? ?Brief narrative: ?Katelyn Kennedy is a 76 y.o. female with PMH significant for DM2, HTN, PVD, lung cancer with brain mets s/p resection and chemoradiation, DVT/PE on Eliquis. ?Patient was brought to the ED from home on 3/27 from home after she slid out of bed and fell to the floor. ?She was being taken care at home by family members over the years of 72 and their own physical limitations.  Family members were not able to pick her up when she fell and hence EMS was called and she was brought to the ED ?In the ED, CT scan of head (compared to CT scan and MRIs from last 2 months) showed extensive dural based hemorrhagic metastatic disease without significant change in extensive vasogenic edema or midline shift small volume extra-axial hemorrhage along the right frontal convexity with superimposed traumatic hemorrhage. ?Patient was admitted to hospital service ? ?Her hospitalization was prolonged because patient was in a phase of denial of the severity of her condition.  At one point patient tried to leave AMA but she did not have enough family support at home. Psychiatry service was consulted and patient was designated not to have capacity for decision.  Family members unable to take her home and provide any support. ?Pending placement plan ? ?Subjective: ?Patient was seen and examined this morning.   ?Sitting up in chair.  Not in distress. ?Keeps on repeating, 'I'm going home.' ?I called her daughter Katelyn Kennedy at [place] and had a long conversation.  Currently waiting for Medicaid number. ? ?Principal Problem: ?  Fall at home secondary to weakness ?Active Problems: ?  Diet-controlled type 2 diabetes mellitus ([place]) ?  Vasogenic brain edema ([place]) ?  Chronic anticoagulation ?  Non-small cell lung cancer metastatic to brain [facility]) with vasogenic brain edema ?  Normocytic anemia ?   Leukocytosis ?  History of DVT (deep vein thrombosis) ?  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  ? ?Assessment and Plan: ?Small traumatic subdural hemorrhage ?Non-small cell lung cancer ?Extensive hemorrhagic brain metastasis extensive vasogenic edema ?-Patient presented after a fall at home.   ?-Known history of lung cancer with brain mets s/p resection, chemoradiation and recent scans showing extensive hemorrhagic mets and vasogenic edema  ?-New CT head on admission showed superimposed small traumatic subdural hemorrhage. ?-Neurosurgery was consulted and patient was started on Decadron.  Continue Keppra for seizure prophylaxis ?-Palliative care consultation was obtained.  Patient declined hospice care. ?-Family unable will take care at home.  Currently pending disposition. ? ?History of DVT/PE on chronic anticoagulation  ?-PE was diagnosed on 05/08/2021.   ?-Despite hemorrhagic metastasis in brain, patient had chosen to continue Eliquis.  She understands the high risk of intracranial bleeding.   ? ?Type II diabetes mellitus ?Hypoglycemia ?-A1c 6.5 in February 2023 ?-Currently on Lantus 10 units daily and 25 units nightly.  Patient had a low blood sugar episode of 58 this morning.  Reduce nighttime Lantus to 15 units.  Continue sliding scale insulin with Accu-Cheks. ?Recent Labs  ?Lab 07/31/21 ?0828 07/31/21 ?1222 07/31/21 ?1614 07/31/21 ?2216 08/01/21 ?0752  ?GLUCAP 130* 330* 218* 140* 128*  ? ? ?Goals of care ?  Code Status: DNR  ? ? ?Mobility: Limited mobility ? ?Nutritional status:  ?Body mass index is 25.99 kg/m?.  ?  ?  ? ? ? ? ?  Diet:  ?Diet Order   ? ?       ?  Diet - low sodium heart healthy       ?  ?  Diet regular Room service appropriate? Yes; Fluid consistency: Thin  Diet effective now       ?  ? ?  ?  ? ?  ? ? ?DVT prophylaxis:  ? ?apixaban (ELIQUIS) tablet 5 mg   ?Antimicrobials: None ?Fluid: None ?Consultants: None at this time ?Family Communication: Family member not at  bedside ? ?Status is: Inpatient ? ?Level of care: Med-Surg  ? ?Dispo: The patient is from: Home ?             Anticipated d/c is to: Family members unable to take care of her at home.  Pending Medicaid number. ?             Patient currently is medically stable to d/c. ?  Difficult to place patient No ? ? ? ? ?Infusions:  ? ? ?Scheduled Meds: ? amitriptyline  50 mg Oral QHS  ? apixaban  5 mg Oral BID  ? atorvastatin  40 mg Oral QPM  ? dexamethasone  4 mg Oral Q12H  ? famotidine  20 mg Oral Daily  ? insulin aspart  0-15 Units Subcutaneous TID WC  ? insulin aspart  5 Units Subcutaneous TID WC  ? insulin glargine-yfgn  10 Units Subcutaneous Daily  ? insulin glargine-yfgn  15 Units Subcutaneous QHS  ? levETIRAcetam  500 mg Oral BID  ? sodium chloride flush  3 mL Intravenous Q12H  ? vitamin B-12  1,000 mcg Oral Daily  ? ? ?PRN meds: ?acetaminophen **OR** acetaminophen, albuterol, docusate sodium, polyethylene glycol  ? ?Antimicrobials: ?Anti-infectives (From admission, onward)  ? ? None  ? ?  ? ? ?Objective: ?Vitals:  ? 08/01/21 0545 08/01/21 0754  ?BP: (!) 99/56 114/64  ?Pulse: 93 95  ?Resp: 16 17  ?Temp: 97.8 ?F (36.6 ?C) 98.2 ?F (36.8 ?C)  ?SpO2: 99% 99%  ? ? ?Intake/Output Summary (Last 24 hours) at 08/01/2021 1046 ?Last data filed at 08/01/2021 0920 ?Gross per 24 hour  ?Intake 720 ml  ?Output 1900 ml  ?Net -1180 ml  ? ? ? ?Filed Weights  ? 07/07/21 0238 07/08/21 0326  ?Weight: (P) 57.6 kg 56.4 kg  ? ?Weight change:  ?Body mass index is 25.99 kg/m?.  ? ?Physical Exam: ?General exam: Pleasant, elderly Caucasian female.  Sitting up in chair.  Not in distress. ?Skin: No rashes, lesions or ulcers. ?HEENT: Atraumatic, normocephalic, no obvious bleeding ?Lungs: Clear to auscultation bilaterally ?CVS: Regular rate and rhythm, no murmur ?GI/Abd soft, nontender, nondistended, bowel sound present ?CNS: Alert, awake, oriented x3 ?Psychiatry: Sad affect ?Extremities: No pedal edema, no calf tenderness ? ?Data Review: I have  personally reviewed the laboratory data and studies available. ? ?F/u labs ordered ?Unresulted Labs (From admission, onward)  ? ? None  ? ?  ? ? ?Signed, ?Terrilee Croak, MD ?[facility] ?08/01/2021 ? ? ? ? ? ? ? ? ? ? ? ? ?

## 2021-08-01 NOTE — Progress Notes (Signed)
Mobility Specialist Progress Note: ? ? 08/01/21 1100  ?Mobility  ?Activity Ambulated with assistance in room  ?Level of Assistance Minimal assist, patient does 75% or more  ?Assistive Device Front wheel walker  ?Distance Ambulated (ft) 4 ft  ?Activity Response Tolerated well  ?$Mobility charge 1 Mobility  ? ?Pt received in bed willing to participate in mobility. Complains of LLE pain. Left in chair with call bell in reach and all needs met.  ? ?Josian Lanese ?Mobility Specialist ?Primary Phone [phone] ? ?

## 2021-08-01 NOTE — Progress Notes (Signed)
Mobility Specialist Progress Note: ? ? 08/01/21 1609  ?Mobility  ?Activity Ambulated with assistance in room;Transferred from chair to bed  ?Level of Assistance Moderate assist, patient does 50-74%  ?Assistive Device Front wheel walker  ?Distance Ambulated (ft) 4 ft  ?Activity Response Tolerated well  ?$Mobility charge 1 Mobility  ? ?Pt received in chair willing to participate in mobility. Pt attempting to stand x3 but could not complete the stand. Pt then requiring modA to step to bed. Left in bed with call bell in reach and all needs met.    ? ?Katelyn Kennedy ?Mobility Specialist ?Primary Phone [phone] ? ?

## 2021-08-01 NOTE — Progress Notes (Signed)
CSW spoke with patient's sister [name] to provide her with an update. There is no pending Medicaid number available yet. ? ?Madilyn Fireman, MSW, LCSW ?Transitions of Care  Clinical Social Worker II ?[phone] ? ?

## 2021-08-02 LAB — GLUCOSE, CAPILLARY
Glucose-Capillary: 325 mg/dL — ABNORMAL HIGH (ref 70–99)
Glucose-Capillary: 130 mg/dL — ABNORMAL HIGH (ref 70–99)
Glucose-Capillary: 174 mg/dL — ABNORMAL HIGH (ref 70–99)
Glucose-Capillary: 157 mg/dL — ABNORMAL HIGH (ref 70–99)

## 2021-08-02 NOTE — Progress Notes (Signed)
Mobility Specialist Progress Note: ? ? 08/02/21 1416  ?Mobility  ?Activity Ambulated with assistance in room;Transferred from chair to bed  ?Level of Assistance Minimal assist, patient does 75% or more  ?Assistive Device Front wheel walker  ?Distance Ambulated (ft) 4 ft  ?Activity Response Tolerated well  ?$Mobility charge 1 Mobility  ? ?Pt received in chair wanting to go back to bed. MinA to stand and step to bed. Left in bed with call bell in reach and all needs met. ? ?Katelyn Kennedy ?Mobility Specialist ?Primary Phone [phone] ? ?

## 2021-08-02 NOTE — Progress Notes (Signed)
Mobility Specialist Progress Note: ? ? 08/02/21 1036  ?Mobility  ?Activity Ambulated with assistance in room;Transferred from bed to chair  ?Level of Assistance Minimal assist, patient does 75% or more  ?Assistive Device Front wheel walker  ?Distance Ambulated (ft) 4 ft  ?Activity Response Tolerated well  ?$Mobility charge 1 Mobility  ? ?Pt received in bed willing to participate in mobility. Complaints of knee and leg pain. Left in chair with call bell in reach and all needs met.  ? ?Sareena Odeh ?Mobility Specialist ?Primary Phone [phone] ? ?

## 2021-08-02 NOTE — Progress Notes (Addendum)
?PROGRESS NOTE ? ?Katelyn Kennedy  ?DOB: Sep 08, 1945  ?PCP: [name], MD ?ZDG:[id number]  ?DOA: 07/07/2021 ? LOS: 23 days  ?Hospital Day: 63 ? ?Brief narrative: ?Katelyn Kennedy is a 76 y.o. female with PMH significant for DM2, HTN, PVD, lung cancer with brain mets s/p resection and chemoradiation, DVT/PE on Eliquis. ?Patient was brought to the ED from home on 3/27 from home after she slid out of bed and fell to the floor. ?She was being taken care at home by family members over the years of 53 and their own physical limitations.  Family members were not able to pick her up when she fell and hence EMS was called and she was brought to the ED ?In the ED, CT scan of head (compared to CT scan and MRIs from last 2 months) showed extensive dural based hemorrhagic metastatic disease without significant change in extensive vasogenic edema or midline shift small volume extra-axial hemorrhage along the right frontal convexity with superimposed traumatic hemorrhage. ?Patient was admitted to hospital service ? ?Her hospitalization was prolonged because patient was in a phase of denial of the severity of her condition.  At one point patient tried to leave AMA but she did not have enough family support at home. Psychiatry service was consulted and patient was designated not to have capacity for decision.  Family members unable to take her home and provide any support. ?Pending placement plan ? ?Subjective: ?Patient was seen and examined this morning.   ?Sitting up in chair.  Not in distress. ? ?Principal Problem: ?  Fall at home secondary to weakness ?Active Problems: ?  Diet-controlled type 2 diabetes mellitus ([place]) ?  Vasogenic brain edema ([place]) ?  Chronic anticoagulation ?  Non-small cell lung cancer metastatic to brain [facility]) with vasogenic brain edema ?  Normocytic anemia ?  Leukocytosis ?  History of DVT (deep vein thrombosis) ?  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  ? ?Assessment and  Plan: ?Small traumatic subdural hemorrhage ?Non-small cell lung cancer ?Extensive hemorrhagic brain metastasis extensive vasogenic edema ?-Patient presented after a fall at home.   ?-Known history of lung cancer with brain mets s/p resection, chemoradiation and recent scans showing extensive hemorrhagic mets and vasogenic edema  ?-New CT head on admission showed superimposed small traumatic subdural hemorrhage. ?-Neurosurgery was consulted and patient was started on Decadron.  Continue Keppra for seizure prophylaxis ?-Palliative care consultation was obtained.  Patient declined hospice care. ?-Family unable will take care at home.  Currently pending disposition. ? ?History of DVT/PE on chronic anticoagulation  ?-PE was diagnosed on 05/08/2021.   ?-Despite hemorrhagic metastasis in brain, patient had chosen to continue Eliquis.  She understands the high risk of intracranial bleeding.   ? ?Type II diabetes mellitus ?Hypoglycemia ?-A1c 6.5 in February 2023 ?-Currently on Lantus 10 units daily and 25 units nightly.  Patient had a low blood sugar episode of 58 this morning.  Reduce nighttime Lantus to 15 units.  Continue sliding scale insulin with Accu-Cheks. ?Recent Labs  ?Lab 08/01/21 ?1219 08/01/21 ?1725 08/01/21 ?2035 08/02/21 ?9518 08/02/21 ?1225  ?GLUCAP 328* 196* 88 325* 130*  ? ?Goals of care ?  Code Status: DNR  ? ? ?Mobility: Limited mobility ? ?Nutritional status:  ?Body mass index is 25.99 kg/m?.  ?  ?  ? ? ? ? ?Diet:  ?Diet Order   ? ?       ?  Diet - low sodium heart healthy       ?  ?  Diet regular Room service appropriate? Yes; Fluid consistency: Thin  Diet effective now       ?  ? ?  ?  ? ?  ? ? ?DVT prophylaxis:  ? ?apixaban (ELIQUIS) tablet 5 mg   ?Antimicrobials: None ?Fluid: None ?Consultants: None at this time ?Family Communication: Family member not at bedside ? ?Status is: Inpatient ? ?Level of care: Med-Surg  ? ?Dispo: The patient is from: Home ?             Anticipated d/c is to: Family members  unable to take care of her at home.  Pending Medicaid number. ?             Patient currently is medically stable to d/c. ?  Difficult to place patient No ? ? ? ? ?Infusions:  ? ? ?Scheduled Meds: ? amitriptyline  50 mg Oral QHS  ? apixaban  5 mg Oral BID  ? atorvastatin  40 mg Oral QPM  ? dexamethasone  4 mg Oral Q12H  ? famotidine  20 mg Oral Daily  ? insulin aspart  0-15 Units Subcutaneous TID WC  ? insulin aspart  5 Units Subcutaneous TID WC  ? insulin glargine-yfgn  10 Units Subcutaneous Daily  ? insulin glargine-yfgn  15 Units Subcutaneous QHS  ? levETIRAcetam  500 mg Oral BID  ? sodium chloride flush  3 mL Intravenous Q12H  ? vitamin B-12  1,000 mcg Oral Daily  ? ? ?PRN meds: ?acetaminophen **OR** acetaminophen, albuterol, docusate sodium, polyethylene glycol  ? ?Antimicrobials: ?Anti-infectives (From admission, onward)  ? ? None  ? ?  ? ? ?Objective: ?Vitals:  ? 08/02/21 0526 08/02/21 0817  ?BP: (!) 99/59 (!) 110/56  ?Pulse: 97 97  ?Resp: 18 18  ?Temp: (!) 97.5 ?F (36.4 ?C) 98.2 ?F (36.8 ?C)  ?SpO2: 98% 98%  ? ? ?Intake/Output Summary (Last 24 hours) at 08/02/2021 1421 ?Last data filed at 08/02/2021 8088 ?Gross per 24 hour  ?Intake 480 ml  ?Output 1400 ml  ?Net -920 ml  ? ? ?Filed Weights  ? 07/07/21 0238 07/08/21 0326  ?Weight: (P) 57.6 kg 56.4 kg  ? ?Weight change:  ?Body mass index is 25.99 kg/m?.  ? ?Physical Exam: ?General exam: Pleasant, elderly Caucasian female.  Sitting up in chair.  Not in distress. ?Skin: No rashes, lesions or ulcers. ?HEENT: Atraumatic, normocephalic, no obvious bleeding ?Lungs: Clear to auscultation bilaterally ?CVS: Regular rate and rhythm, no murmur ?GI/Abd soft, nontender, nondistended, bowel sound present ?CNS: Alert, awake, oriented x3 ?Psychiatry: Sad affect ?Extremities: No pedal edema, no calf tenderness ? ?Data Review: I have personally reviewed the laboratory data and studies available. ? ?F/u labs ordered ?Unresulted Labs (From admission, onward)  ? ? None  ? ?   ? ? ?Signed, ?Terrilee Croak, MD ?[facility] ?08/02/2021 ? ? ? ? ? ? ? ? ? ? ? ? ?

## 2021-08-03 LAB — GLUCOSE, CAPILLARY
Glucose-Capillary: 160 mg/dL — ABNORMAL HIGH (ref 70–99)
Glucose-Capillary: 267 mg/dL — ABNORMAL HIGH (ref 70–99)
Glucose-Capillary: 196 mg/dL — ABNORMAL HIGH (ref 70–99)
Glucose-Capillary: 110 mg/dL — ABNORMAL HIGH (ref 70–99)

## 2021-08-03 NOTE — Progress Notes (Signed)
?PROGRESS NOTE ? ?Katelyn Kennedy  ?DOB: 09-Jan-1946  ?PCP: [name], MD ?KDX:[id number]  ?DOA: 07/07/2021 ? LOS: 24 days  ?Hospital Day: 28 ? ?Brief narrative: ?Katelyn Kennedy is a 77 y.o. female with PMH significant for DM2, HTN, PVD, lung cancer with brain mets s/p resection and chemoradiation, DVT/PE on Eliquis. ?Patient was brought to the ED from home on 3/27 from home after she slid out of bed and fell to the floor. ?She was being taken care at home by family members over the years of 78 and their own physical limitations.  Family members were not able to pick her up when she fell and hence EMS was called and she was brought to the ED ?In the ED, CT scan of head (compared to CT scan and MRIs from last 2 months) showed extensive dural based hemorrhagic metastatic disease without significant change in extensive vasogenic edema or midline shift small volume extra-axial hemorrhage along the right frontal convexity with superimposed traumatic hemorrhage. ?Patient was admitted to hospital service ? ?Her hospitalization was prolonged because patient was in a phase of denial of the severity of her condition.  At one point patient tried to leave AMA but she did not have enough family support at home. Psychiatry service was consulted and patient was designated not to have capacity for decision.  Family members unable to take her home and provide any support. ?Pending placement plan ? ?Subjective: ?Patient was seen and examined this morning.   ?Sitting up in chair.  Not in distress. ?No new symptoms.  No seizure in the hospital.  Intermittent confusion episodes per staff. ? ?Principal Problem: ?  Fall at home secondary to weakness ?Active Problems: ?  Diet-controlled type 2 diabetes mellitus ([place]) ?  Vasogenic brain edema ([place]) ?  Chronic anticoagulation ?  Non-small cell lung cancer metastatic to brain [facility]) with vasogenic brain edema ?  Normocytic anemia ?  Leukocytosis ?  History of DVT (deep vein thrombosis) ?   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  ? ?Assessment and Plan: ?Small traumatic subdural hemorrhage ?Non-small cell lung cancer ?Extensive hemorrhagic brain metastasis extensive vasogenic edema ?-Patient presented after a fall at home.   ?-Known history of lung cancer with brain mets s/p resection, chemoradiation and recent scans showing extensive hemorrhagic mets and vasogenic edema  ?-New CT head on admission showed superimposed small traumatic subdural hemorrhage. ?-Neurosurgery was consulted and patient was started on Decadron.  Continue Keppra for seizure prophylaxis ?-Palliative care consultation was obtained.  Patient declined hospice care. ?-Family unable will take care at home.  Currently pending disposition. ? ?History of DVT/PE on chronic anticoagulation  ?-PE was diagnosed on 05/08/2021.   ?-Despite hemorrhagic metastasis in brain, patient had chosen to continue Eliquis.  She understands the high risk of intracranial bleeding.   ? ?Type II diabetes mellitus ?Hypoglycemia ?-A1c 6.5 in February 2023 ?-Currently on Lantus 10 units daily and 25 units nightly.  Patient had a low blood sugar episode of 58 this morning.  Reduce nighttime Lantus to 15 units.  Continue sliding scale insulin with Accu-Cheks. ?Recent Labs  ?Lab 08/02/21 ?9767 08/02/21 ?1225 08/02/21 ?1552 08/02/21 ?2033 08/03/21 ?0800  ?GLUCAP 325* 130* 174* 157* 160*  ? ? ?Goals of care ?  Code Status: DNR  ? ? ?Mobility: Limited mobility ? ?Nutritional status:  ?Body mass index is 25.99 kg/m?.  ?  ?  ? ? ? ? ?Diet:  ?Diet Order   ? ?       ?  Diet - low sodium heart healthy       ?  ?  Diet regular Room service appropriate? Yes; Fluid consistency: Thin  Diet effective now       ?  ? ?  ?  ? ?  ? ? ?DVT prophylaxis:  ? ?apixaban (ELIQUIS) tablet 5 mg   ?Antimicrobials: None ?Fluid: None ?Consultants: None at this time ?Family Communication: Family member not at bedside.  4/21, I had a long conversation with patient's  daughter in [place].  She stated she is not able to be the decision maker.  Patient's sister [name] is the one interest with case management. ? ?Status is: Inpatient ? ?Level of care: Med-Surg  ? ?Dispo: The patient is from: Home ?             Anticipated d/c is to: Family members unable to take care of her at home.  Pending Medicaid number. ?             Patient currently is medically stable to d/c. ?  Difficult to place patient No ? ? ? ? ?Infusions:  ? ? ?Scheduled Meds: ? amitriptyline  50 mg Oral QHS  ? apixaban  5 mg Oral BID  ? atorvastatin  40 mg Oral QPM  ? dexamethasone  4 mg Oral Q12H  ? famotidine  20 mg Oral Daily  ? insulin aspart  0-15 Units Subcutaneous TID WC  ? insulin aspart  5 Units Subcutaneous TID WC  ? insulin glargine-yfgn  10 Units Subcutaneous Daily  ? insulin glargine-yfgn  15 Units Subcutaneous QHS  ? levETIRAcetam  500 mg Oral BID  ? sodium chloride flush  3 mL Intravenous Q12H  ? vitamin B-12  1,000 mcg Oral Daily  ? ? ?PRN meds: ?acetaminophen **OR** acetaminophen, albuterol, docusate sodium, polyethylene glycol  ? ?Antimicrobials: ?Anti-infectives (From admission, onward)  ? ? None  ? ?  ? ? ?Objective: ?Vitals:  ? 08/03/21 0636 08/03/21 0757  ?BP: 111/60 (!) 113/59  ?Pulse: 89 (!) 103  ?Resp: 18 18  ?Temp: 98 ?F (36.7 ?C) 97.9 ?F (36.6 ?C)  ?SpO2: 98% 97%  ? ? ?Intake/Output Summary (Last 24 hours) at 08/03/2021 1029 ?Last data filed at 08/03/2021 0931 ?Gross per 24 hour  ?Intake 1080 ml  ?Output 1900 ml  ?Net -820 ml  ? ? ? ?Filed Weights  ? 07/07/21 0238 07/08/21 0326  ?Weight: (P) 57.6 kg 56.4 kg  ? ?Weight change:  ?Body mass index is 25.99 kg/m?.  ? ?Physical Exam: ?General exam: Pleasant, elderly Caucasian female.  Sitting up in chair.  Not in distress. ?Skin: No rashes, lesions or ulcers. ?HEENT: Atraumatic, normocephalic, no obvious bleeding ?Lungs: Clear to auscultation bilaterally ?CVS: Regular rate and rhythm, no murmur ?GI/Abd soft, nontender, nondistended, bowel sound  present ?CNS: Alert, awake, oriented x3 ?Psychiatry: Sad affect ?Extremities: No pedal edema, no calf tenderness ? ?Data Review: I have personally reviewed the laboratory data and studies available. ? ?F/u labs ordered ?Unresulted Labs (From admission, onward)  ? ? None  ? ?  ? ? ?Signed, ?Terrilee Croak, MD ?[facility] ?08/03/2021 ? ? ? ? ? ? ? ? ? ? ? ? ?

## 2021-08-03 NOTE — Progress Notes (Signed)
Mobility Specialist Progress Note: ? ? 08/03/21 1455  ?Mobility  ?Activity Ambulated with assistance in room;Transferred from chair to bed  ?Level of Assistance Minimal assist, patient does 75% or more  ?Assistive Device Front wheel walker  ?Distance Ambulated (ft) 4 ft  ?Activity Response Tolerated well  ?$Mobility charge 1 Mobility  ? ?Pt in chair needing to go back to bed. No complaints of pain. MinA to stand and step to bed. Requires a lot of cues to keep walker closer and to step with both feet. Left in bed with call bell in reach and all needs met.  ? ?Katelyn Kennedy ?Mobility Specialist ?Primary Phone [phone] ? ?

## 2021-08-03 NOTE — Progress Notes (Signed)
Mobility Specialist Progress Note: ? ? 08/03/21 1052  ?Mobility  ?Activity Ambulated with assistance in room;Transferred from bed to chair  ?Level of Assistance Minimal assist, patient does 75% or more  ?Assistive Device Front wheel walker  ?Distance Ambulated (ft) 4 ft  ?Activity Response Tolerated well  ?$Mobility charge 1 Mobility  ? ?Pt received in bed willing to participate in mobility. No complaints of pain. MinA to stand and strep to chair. Required lots of cues to stay close to walk and to move both feet. Left in chair with call bell in reach and all needs met.  ? ?Kalin Amrhein ?Mobility Specialist ?Primary Phone [phone] ? ?

## 2021-08-04 DIAGNOSIS — R531 Weakness: Secondary | ICD-10-CM | POA: Diagnosis not present

## 2021-08-04 LAB — GLUCOSE, CAPILLARY
Glucose-Capillary: 92 mg/dL (ref 70–99)
Glucose-Capillary: 243 mg/dL — ABNORMAL HIGH (ref 70–99)
Glucose-Capillary: 182 mg/dL — ABNORMAL HIGH (ref 70–99)
Glucose-Capillary: 133 mg/dL — ABNORMAL HIGH (ref 70–99)

## 2021-08-04 MED ORDER — DEXAMETHASONE 4 MG PO TABS
4.0000 mg | ORAL_TABLET | Freq: Every day | ORAL | Status: DC
  Administered 2021-08-05 – 2021-08-14 (×10): 4 mg via ORAL
  Filled 2021-08-04 (×10): qty 1

## 2021-08-04 NOTE — Progress Notes (Signed)
Mobility Specialist Progress Note: ? ? 08/04/21 1259  ?Mobility  ?Activity Ambulated with assistance in room;Transferred from bed to chair  ?Level of Assistance Moderate assist, patient does 50-74%  ?Assistive Device Front wheel walker  ?Distance Ambulated (ft) 4 ft  ?Activity Response Tolerated well  ?$Mobility charge 1 Mobility  ? ?Pt received in bed willing to participate in mobility. No complaints of pain. ModA to stand and step to recliner. Left in chair with call bell in reach and all needs met.  ? ?Carson Day ?Mobility Specialist ?Primary Phone [phone] ? ?

## 2021-08-04 NOTE — Progress Notes (Signed)
?PROGRESS NOTE ? ?Katelyn Kennedy  ?DOB: [birth year]  ?PCP: [name], MD ?NOI:[id number]  ?DOA: 07/07/2021 ? LOS: 25 days  ?Hospital Day: 40 ? ?Brief narrative: ?Katelyn Kennedy is a 76 y.o. female with PMH significant for DM2, HTN, PVD, lung cancer with brain mets s/p resection and chemoradiation, DVT/PE on Eliquis. ?Patient was brought to the ED from home on 3/27 from home after she slid out of bed and fell to the floor. ?She was being taken care at home by family members over the years of 70 and their own physical limitations.  Family members were not able to pick her up when she fell and hence EMS was called and she was brought to the ED ?In the ED, CT scan of head (compared to CT scan and MRIs from last 2 months) showed extensive dural based hemorrhagic metastatic disease without significant change in extensive vasogenic edema or midline shift small volume extra-axial hemorrhage along the right frontal convexity with superimposed traumatic hemorrhage. ?Patient was admitted to hospital service ? ?Her hospitalization was prolonged because patient was in a phase of denial of the severity of her condition.  At one point patient tried to leave AMA but she did not have enough family support at home. Psychiatry service was consulted and patient was designated not to have capacity for decision.  Family members unable to take her home and provide any support. ?Pending placement plan ? ?Subjective: ?Patient was seen and examined this morning.   ?Propped up in bed.  Not in distress.  Finished breakfast. ?Her sister-in-law [name] was at bedside. ? ?Principal Problem: ?  Fall at home secondary to weakness ?Active Problems: ?  Diet-controlled type 2 diabetes mellitus ([place]) ?  Vasogenic brain edema ([place]) ?  Chronic anticoagulation ?  Non-small cell lung cancer metastatic to brain [facility]) with vasogenic brain edema ?  Normocytic anemia ?  Leukocytosis ?  History of DVT (deep vein thrombosis) ?  History of pulmonary embolism  and DVT on chronic anticoagulation ?  ? ?Assessment and Plan: ?Small traumatic subdural hemorrhage ?Non-small cell lung cancer ?Extensive hemorrhagic brain metastasis extensive vasogenic edema ?-Patient presented after a fall at home.   ?-Known history of lung cancer with brain mets s/p resection, chemoradiation and recent scans showing extensive hemorrhagic mets and vasogenic edema  ?-New CT head on admission showed superimposed small traumatic subdural hemorrhage. ?-Neurosurgery was consulted and patient was started on Decadron, currently being tapered down.  She is currently on 4 mg twice daily.  I will reduce it to 4 mg daily from tomorrow.  Continue Keppra for seizure prophylaxis ?-Palliative care consultation was obtained.  Patient declined hospice care. ?-Family unable will take care at home.  Medicaid filed.  Currently pending disposition. ? ?History of DVT/PE on chronic anticoagulation  ?-PE was diagnosed on 05/08/2021.   ?-Despite hemorrhagic metastasis in brain, patient had chosen to continue Eliquis.  She understands the high risk of intracranial bleeding.   ? ?Type II diabetes mellitus ?Hypoglycemia ?-A1c 6.5 in February 2023 ?-Currently on Lantus 10 units daily and 25 units nightly.  Patient had a low blood sugar episode of 58 this morning.  Reduce nighttime Lantus to 15 units.  Continue sliding scale insulin with Accu-Cheks. ?Recent Labs  ?Lab 08/03/21 ?0800 08/03/21 ?1227 08/03/21 ?1554 08/03/21 ?2013 08/04/21 ?0825  ?GLUCAP 160* 267* 196* 110* 92  ? ? ?Goals of care ?  Code Status: DNR  ? ? ?Mobility: Limited mobility ? ?Nutritional status:  ?Body mass index  is 25.99 kg/m?.  ?  ?  ? ? ? ? ?Diet:  ?Diet Order   ? ?       ?  Diet - low sodium heart healthy       ?  ?  Diet regular Room service appropriate? Yes; Fluid consistency: Thin  Diet effective now       ?  ? ?  ?  ? ?  ? ? ?DVT prophylaxis:  ? ?apixaban (ELIQUIS) tablet 5 mg   ?Antimicrobials: None ?Fluid: None ?Consultants: None at this  time ?Family Communication:  ?-4/21, I had a long conversation with patient's daughter in [place].  She stated she is not able to be the decision maker.  Patient's sister [name] is the one interest with case management. ?-424, I talked to patient's sister-in-law Mariann Laster at bedside. ? ?Status is: Inpatient ? ?Level of care: Med-Surg  ? ?Dispo: The patient is from: Home ?             Anticipated d/c is to: Family members unable to take care of her at home.  Pending Medicaid number. ?             Patient currently is medically stable to d/c. ?  Difficult to place patient No ? ? ? ? ?Infusions:  ? ? ?Scheduled Meds: ? amitriptyline  50 mg Oral QHS  ? apixaban  5 mg Oral BID  ? atorvastatin  40 mg Oral QPM  ? [START ON 08/05/2021] dexamethasone  4 mg Oral Daily  ? famotidine  20 mg Oral Daily  ? insulin aspart  0-15 Units Subcutaneous TID WC  ? insulin aspart  5 Units Subcutaneous TID WC  ? insulin glargine-yfgn  10 Units Subcutaneous Daily  ? insulin glargine-yfgn  15 Units Subcutaneous QHS  ? levETIRAcetam  500 mg Oral BID  ? sodium chloride flush  3 mL Intravenous Q12H  ? vitamin B-12  1,000 mcg Oral Daily  ? ? ?PRN meds: ?acetaminophen **OR** acetaminophen, albuterol, docusate sodium, polyethylene glycol  ? ?Antimicrobials: ?Anti-infectives (From admission, onward)  ? ? None  ? ?  ? ? ?Objective: ?Vitals:  ? 08/04/21 0516 08/04/21 0824  ?BP: (!) 107/56 126/81  ?Pulse: 85 90  ?Resp: 16 18  ?Temp: 97.9 ?F (36.6 ?C) (!) 97.5 ?F (36.4 ?C)  ?SpO2: 100% 100%  ? ? ?Intake/Output Summary (Last 24 hours) at 08/04/2021 1107 ?Last data filed at 08/04/2021 0516 ?Gross per 24 hour  ?Intake 240 ml  ?Output 1550 ml  ?Net -1310 ml  ? ? ? ?Filed Weights  ? 07/07/21 0238 07/08/21 0326  ?Weight: (P) 57.6 kg 56.4 kg  ? ?Weight change:  ?Body mass index is 25.99 kg/m?.  ? ?Physical Exam: ?General exam: Pleasant, elderly Caucasian female.  Sitting up in chair.  Not in distress. ?Skin: No rashes, lesions or ulcers. ?HEENT: Atraumatic,  normocephalic, no obvious bleeding ?Lungs: Clear to auscultation bilaterally ?CVS: Regular rate and rhythm, no murmur ?GI/Abd soft, nontender, nondistended, bowel sound present ?CNS: Alert, awake, oriented x3 ?Psychiatry: Sad affect ?Extremities: No pedal edema, no calf tenderness ? ?Data Review: I have personally reviewed the laboratory data and studies available. ? ?F/u labs ordered ?Unresulted Labs (From admission, onward)  ? ? None  ? ?  ? ? ?Signed, ?Terrilee Croak, MD ?[facility] ?08/04/2021 ? ? ? ? ? ? ? ? ? ? ? ? ?

## 2021-08-04 NOTE — Progress Notes (Signed)
Mobility Specialist Progress Note: ? ? 08/04/21 1557  ?Mobility  ?Activity Ambulated with assistance in room;Transferred from chair to bed  ?Level of Assistance Moderate assist, patient does 50-74%  ?Assistive Device Front wheel walker  ?Distance Ambulated (ft) 4 ft  ?Activity Response Tolerated well  ?$Mobility charge 1 Mobility  ? ?Pt received in chair asking to go back to bed. No complaints of pain. ModA to stand and step to bed. Left in bed with call bell in reach and all needs met.  ? ?Katelyn Kennedy ?Mobility Specialist ?Primary Phone [phone] ? ?

## 2021-08-04 NOTE — Progress Notes (Deleted)
Mobility Specialist Progress Note: ? ? 08/04/21 1618  ?Mobility  ?Activity Ambulated with assistance in hallway  ?Level of Assistance Independent  ?Assistive Device None  ?Distance Ambulated (ft) 570 ft  ?Activity Response Tolerated well  ?$Mobility charge 1 Mobility  ? ?Pt received coming out of BR asking to ambulate. Complaints of abdominal pain. Left in bed with call bell in reach and all needs met.  ? ?Tavis Kring ?Mobility Specialist ?Primary Phone [phone] ? ?

## 2021-08-04 NOTE — TOC Progression Note (Signed)
Transition of Care (TOC) - Progression Note  ? ? ?Patient Details  ?Name: Katelyn Kennedy ?MRN: [id number] ?Date of Birth: April 10, 1946 ? ?Transition of Care (TOC) CM/SW Contact  ?Curlene Labrum, RN ?Phone Number: ?08/04/2021, 2:50 PM ? ?Clinical Narrative:    ?CM and MSW with DTP Team continue to follow the patient for discharge needs for SNF placement.  PT is continuing to recommend SNF placement since the patient lives alone and does not have family availability for care.  Financial counseling note from 08/01/21 - by Santa Lighter, financial counselor states that she faxed additional documents to DSS - no pending Medicaid number at this time.   ? ?CM and MSW will continue to follow for TOC needs. ? ? ? ?Expected Discharge Plan: [place] ?Barriers to Discharge: Continued Medical Work up, Ship broker, Family Issues ? ?Expected Discharge Plan and Services ?Expected Discharge Plan: [place] ?  ?  ?  ?Living arrangements for the past 2 months: [place] ?Expected Discharge Date: 07/09/21               ?  ?  ?  ?  ?  ?  ?  ?  ?  ?  ? ? ?Social Determinants of Health (SDOH) Interventions ?  ? ?Readmission Risk Interventions ? ?  07/18/2021  ? 12:39 PM 03/19/2021  ?  2:43 PM 11/08/2020  ?  3:25 PM  ?Readmission Risk Prevention Plan  ?Post Dischage Appt   Complete  ?Medication Screening   Complete  ?Transportation Screening Complete Complete Complete  ?PCP or Specialist Appt within 3-5 Days  Complete   ?[place] or Home Care Consult  Complete   ?Social Work Consult for [place] Planning/Counseling  Complete   ?Palliative Care Screening  Not Applicable   ?Medication Review [employer]) Complete Complete   ?PCP or Specialist appointment within 3-5 days of discharge Complete    ?[place] or Home Care Consult Complete    ?SW Recovery Care/Counseling Consult Complete    ?Palliative Care Screening Complete    ?Skilled Nursing Facility Complete    ? ? ?

## 2021-08-05 DIAGNOSIS — R531 Weakness: Secondary | ICD-10-CM | POA: Diagnosis not present

## 2021-08-05 LAB — GLUCOSE, CAPILLARY
Glucose-Capillary: 50 mg/dL — ABNORMAL LOW (ref 70–99)
Glucose-Capillary: 465 mg/dL — ABNORMAL HIGH (ref 70–99)
Glucose-Capillary: 325 mg/dL — ABNORMAL HIGH (ref 70–99)
Glucose-Capillary: 129 mg/dL — ABNORMAL HIGH (ref 70–99)

## 2021-08-05 NOTE — Progress Notes (Signed)
Physical Therapy Treatment ?Patient Details ?Name: Katelyn Kennedy ?MRN: [id number] ?DOB: [birth year] ?Today's Date: 08/05/2021 ? ? ?History of Present Illness Pt is a 76 y.o. female admitted 07/07/21 after fall out of bed with reports of worsening L-side weakness related to brain metastases with prior resection. Head CT showing continued presence of brain mets with vasogenic edema and leftward shift of the brain. 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 ? ?  ?PT Comments  ? ? Pt OOB in chair requesting to transfer back to bed secondary to pain and fatigue up in chair today. Max attempts to stand from recliner, unsuccessful secondary to inability to power up through LE and pt pushing posterior with LUE. Pt unable to shift weight anteriorly without max assist. Pt needing max assist to transfer to bed with use of steady. Pt max assist for bed mobility. Pt continues to benefit from skilled PT services to progress toward functional mobility goals.  ?  ?Recommendations for follow up therapy are one component of a multi-disciplinary discharge planning process, led by the attending physician.  Recommendations may be updated based on patient status, additional functional criteria and insurance authorization. ? ?Follow Up Recommendations ? Skilled nursing-short term rehab (<3 hours/day) ?  ?  ?Assistance Recommended at Discharge Frequent or constant Supervision/Assistance  ?Patient can return home with the following A lot of help with bathing/dressing/bathroom;Assistance with cooking/housework;Direct supervision/assist for financial management;Direct supervision/assist for medications management;Assist for transportation;Help with stairs or ramp for entrance;Two people to help with walking and/or transfers ?  ?Equipment Recommendations ? Wheelchair (measurements PT);Wheelchair cushion (measurements PT);Hospital bed;Other (comment)  ?   ?Recommendations for Other Services   ? ? ?  ?Precautions / Restrictions Precautions ?Precautions: Fall;Other (comment) ?Precaution Comments: L side weakness and inattention ?Restrictions ?Weight Bearing Restrictions: No  ?  ? ?Mobility ? Bed Mobility ?Overal bed mobility: Needs Assistance ?Bed Mobility: Sit to Supine ?  ?  ?  ?Sit to supine: Max assist ?  ?General bed mobility comments: max a to bring LE into bed ?  ? ?Transfers ?Overall transfer level: Needs assistance ?Equipment used: Rolling walker (2 wheels) ?Transfers: Bed to chair/wheelchair/BSC ?  ?  ?  ?  ?  ?  ?General transfer comment: max assist to come to partial stand in stedy to trasnfer to bed ?Transfer via Lift Equipment: Stedy ? ?Ambulation/Gait ?  ?  ?  ?  ?  ?  ?  ?  ? ? ?Stairs ?  ?  ?  ?  ?  ? ? ?Wheelchair Mobility ?  ? ?Modified Rankin (Stroke Patients Only) ?  ? ? ?  ?Balance Overall balance assessment: Needs assistance ?Sitting-balance support: Feet supported ?Sitting balance-Leahy Scale: Fair ?Sitting balance - Comments: min guard assist to maintain sitting balance. ?Postural control: Left lateral lean ?Standing balance support: Bilateral upper extremity supported ?Standing balance-Leahy Scale: Poor ?Standing balance comment: mod A with walker for balance ?  ?  ?  ?  ?  ?  ?  ?  ?  ?  ?  ?  ? ?  ?Cognition Arousal/Alertness: Awake/alert ?Behavior During Therapy: Flat affect ?Overall Cognitive Status: Impaired/Different from baseline ?Area of Impairment: Orientation, Attention, Memory, Following commands, Safety/judgement, Awareness, Problem solving ?  ?  ?  ?  ?  ?  ?  ?  ?Orientation Level: Disoriented to, Time, Situation ?Current Attention Level: Sustained ?Memory: Decreased short-term memory ?Following Commands: Follows one step commands  with increased time ?Safety/Judgement: Decreased awareness of safety, Decreased awareness of deficits ?Awareness: Emergent ?Problem Solving: Slow processing, Requires verbal cues, Decreased  initiation ?  ?  ?  ? ?  ?Exercises   ? ?  ?General Comments   ?  ?  ? ?Pertinent Vitals/Pain Pain Assessment ?Pain Assessment: Faces ?Faces Pain Scale: Hurts little more ?Pain Location: left side, low back ?Pain Descriptors / Indicators: Discomfort, Sore ?Pain Intervention(s): Limited activity within patient's tolerance, Monitored during session, Repositioned  ? ? ?Home Living   ?  ?  ?  ?  ?  ?  ?  ?  ?  ?   ?  ?Prior Function    ?  ?  ?   ? ?PT Goals (current goals can now be found in the care plan section) Acute Rehab PT Goals ?PT Goal Formulation: With patient ?Time For Goal Achievement: 07/29/21 ? ?  ?Frequency ? ? ? Min 2X/week ? ? ? ?  ?PT Plan Current plan remains appropriate  ? ? ?Co-evaluation   ?  ?  ?  ?  ? ?  ?AM-PAC PT "6 Clicks" Mobility   ?Outcome Measure ? Help needed turning from your back to your side while in a flat bed without using bedrails?: A Lot ?Help needed moving from lying on your back to sitting on the side of a flat bed without using bedrails?: A Lot ?Help needed moving to and from a bed to a chair (including a wheelchair)?: Total ?Help needed standing up from a chair using your arms (e.g., wheelchair or bedside chair)?: Total ?Help needed to walk in hospital room?: Total ?Help needed climbing 3-5 steps with a railing? : Total ?6 Click Score: 8 ? ?  ?End of Session Equipment Utilized During Treatment: Gait belt ?Activity Tolerance: Patient limited by fatigue ?Patient left: in bed;with call bell/phone within reach ?Nurse Communication: Mobility status ?PT Visit Diagnosis: Unsteadiness on feet (R26.81);Muscle weakness (generalized) (M62.81);Repeated falls (R29.6);History of falling (Z91.81) ?  ? ? ?Time: 5638-7564 ?PT Time Calculation (min) (ACUTE ONLY): 27 min ? ?Charges:  $Therapeutic Activity: 23-37 mins          ?          ? ?Audry Riles. PTA ?Acute Rehabilitation Services ?Office: [phone] ? ? ? ?Betsey Holiday Avalee Castrellon ?08/05/2021, 1:54 PM ? ?

## 2021-08-05 NOTE — Progress Notes (Addendum)
Mobility Specialist Progress Note  ? ? 08/05/21 1203  ?Mobility  ?Activity Transferred from bed to chair  ?Level of Assistance Maximum assist, patient does 25-49%  ?Assistive Device Front wheel walker  ?Activity Response Tolerated fair  ?$Mobility charge 1 Mobility  ? ?Pt received in bed and agreeable. C/o being really weak and really tired. Pt w/ trunk flexion during bed mobility and standing. MaxA for pivot. Left with call bell in reach.  ? ?Hildred Alamin ?Mobility Specialist  ?Primary: 5N M.S. Phone: [phone] ?Secondary: 6N M.S. Phone: [phone] ?  ?

## 2021-08-05 NOTE — Progress Notes (Addendum)
?PROGRESS NOTE ? ?Katelyn Kennedy  ?DOB: [birth year]  ?PCP: [name], MD ?XBD:[id number]  ?DOA: 07/07/2021 ? LOS: 26 days  ?Hospital Day: 30 ? ?Brief narrative: ?Katelyn Kennedy is a 76 y.o. female with PMH significant for DM2, HTN, PVD, lung cancer with brain mets s/p resection and chemoradiation, DVT/PE on Eliquis. ?Patient was brought to the ED from home on 3/27 from home after she slid out of bed and fell to the floor. ?She was being taken care at home by family members over the years of 72 and their own physical limitations.  Family members were not able to pick her up when she fell and hence EMS was called and she was brought to the ED ?In the ED, CT scan of head (compared to CT scan and MRIs from last 2 months) showed extensive dural based hemorrhagic metastatic disease without significant change in extensive vasogenic edema or midline shift small volume extra-axial hemorrhage along the right frontal convexity with superimposed traumatic hemorrhage. ?Patient was admitted to hospital service ? ?Her hospitalization was prolonged because patient was in a phase of denial of the severity of her condition.  At one point patient tried to leave AMA but she did not have enough family support at home. Psychiatry service was consulted and patient was designated not to have capacity for decision.  Family members unable to take her home and provide any support. ?Pending placement plan ? ?Subjective: ?Patient was seen and examined this morning.   ?Propped up in bed.  Not in distress.  No family at bedside ? ?Principal Problem: ?  Fall at home secondary to weakness ?Active Problems: ?  Diet-controlled type 2 diabetes mellitus ([place]) ?  Vasogenic brain edema ([place]) ?  Chronic anticoagulation ?  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  Normocytic anemia ?  Leukocytosis ?  History of DVT (deep vein thrombosis) ?  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   ? ?Assessment and Plan: ?Small traumatic subdural hemorrhage ?Non-small cell lung cancer ?Extensive hemorrhagic brain metastasis extensive vasogenic edema ?-Patient presented after a fall at home.   ?-Known history of lung cancer with brain mets s/p resection, chemoradiation and recent scans showing extensive hemorrhagic mets and vasogenic edema  ?-New CT head on admission showed superimposed small traumatic subdural hemorrhage. ?-Neurosurgery was consulted and patient was started on Decadron, currently being tapered down.  She is currently on 4 mg twice daily.  I reduced it to 4 mg daily from this morning.  Continue Keppra for seizure prophylaxis ?-Palliative care consultation was obtained.  Patient declined hospice care. ?-Family unable will take care at home.  Medicaid filed.  Currently pending disposition. ? ?History of DVT/PE on chronic anticoagulation  ?-PE was diagnosed on 05/08/2021.   ?-Despite hemorrhagic metastasis in brain, patient had chosen to continue Eliquis.  She understands the high risk of intracranial bleeding.   ? ?Type II diabetes mellitus ?Hypoglycemia ?-A1c 6.5 in February 2023 ?-Patient has good appetite and wants to continue regular diet.  Blood sugar level fluctuating. ?-She had a low blood glucose level this morning at 50. ?-Stop morning dose of Semglee.  Continue nightly Semglee at 15 units. ?Continue sliding scale insulin with Accu-Cheks. ?Recent Labs  ?Lab 08/04/21 ?0825 08/04/21 ?1202 08/04/21 ?1551 08/04/21 ?2245 08/05/21 ?0818  ?GLUCAP 92 243* 182* 133* 50*  ? ? ?Goals of care ?  Code Status: DNR  ? ? ?Mobility: Limited mobility ? ?Nutritional status:  ?  Body mass index is 25.99 kg/m?.  ?  ?  ? ? ? ? ?Diet:  ?Diet Order   ? ?       ?  Diet - low sodium heart healthy       ?  ?  Diet regular Room service appropriate? Yes; Fluid consistency: Thin  Diet effective now       ?  ? ?  ?  ? ?  ? ? ?DVT prophylaxis:  ? ?apixaban (ELIQUIS) tablet 5 mg   ?Antimicrobials: None ?Fluid:  None ?Consultants: None at this time ?Family Communication:  ?-4/21, I had a long conversation with patient's daughter in [place].  She stated she is not able to be the decision maker.  Patient's sister [name] is the one interest with case management. ?-424, I talked to patient's sister-in-law Mariann Laster at bedside. ? ?Status is: Inpatient ? ?Level of care: Med-Surg  ? ?Dispo: The patient is from: Home ?             Anticipated d/c is to: Family members unable to take care of her at home.  Pending Medicaid number. ?             Patient currently is medically stable to d/c. ?  Difficult to place patient No ? ? ? ? ?Infusions:  ? ? ?Scheduled Meds: ? amitriptyline  50 mg Oral QHS  ? apixaban  5 mg Oral BID  ? atorvastatin  40 mg Oral QPM  ? dexamethasone  4 mg Oral Daily  ? famotidine  20 mg Oral Daily  ? insulin aspart  0-15 Units Subcutaneous TID WC  ? insulin aspart  5 Units Subcutaneous TID WC  ? insulin glargine-yfgn  15 Units Subcutaneous QHS  ? levETIRAcetam  500 mg Oral BID  ? sodium chloride flush  3 mL Intravenous Q12H  ? vitamin B-12  1,000 mcg Oral Daily  ? ? ?PRN meds: ?acetaminophen **OR** acetaminophen, albuterol, docusate sodium, polyethylene glycol  ? ?Antimicrobials: ?Anti-infectives (From admission, onward)  ? ? None  ? ?  ? ? ?Objective: ?Vitals:  ? 08/05/21 0605 08/05/21 0819  ?BP: 113/64 (!) 116/100  ?Pulse: 95 (!) 104  ?Resp: 17 17  ?Temp: 97.9 ?F (36.6 ?C) 97.8 ?F (36.6 ?C)  ?SpO2: 97% 98%  ? ? ?Intake/Output Summary (Last 24 hours) at 08/05/2021 1511 ?Last data filed at 08/05/2021 0258 ?Gross per 24 hour  ?Intake 240 ml  ?Output 1750 ml  ?Net -1510 ml  ? ? ? ?Filed Weights  ? 07/07/21 0238 07/08/21 0326  ?Weight: (P) 57.6 kg 56.4 kg  ? ?Weight change:  ?Body mass index is 25.99 kg/m?.  ? ?Physical Exam: ?General exam: Pleasant, elderly Caucasian female.  Sitting up in chair.  Not in distress. ?Skin: No rashes, lesions or ulcers. ?HEENT: Atraumatic, normocephalic, no obvious bleeding ?Lungs: Clear  to auscultation bilaterally ?CVS: Regular rate and rhythm, no murmur ?GI/Abd soft, nontender, nondistended, bowel sound present ?CNS: Alert, awake, oriented x3 ?Psychiatry: Sad affect ?Extremities: No pedal edema, no calf tenderness ? ?Data Review: I have personally reviewed the laboratory data and studies available. ? ?F/u labs ordered ?Unresulted Labs (From admission, onward)  ? ? None  ? ?  ? ? ?Signed, ?Terrilee Croak, MD ?[facility] ?08/05/2021 ? ? ? ? ? ? ? ? ? ? ? ? ?

## 2021-08-05 NOTE — Progress Notes (Signed)
Inpatient Diabetes Program Recommendations ? ?AACE/ADA: New Consensus Statement on Inpatient Glycemic Control (2015) ? ?Target Ranges:  Prepandial:   less than 140 mg/dL ?     Peak postprandial:   less than 180 mg/dL (1-2 hours) ?     Critically ill patients:  140 - 180 mg/dL  ? ?Lab Results  ?Component Value Date  ? GLUCAP 50 (L) 08/05/2021  ? HGBA1C 6.5 05/15/2021  ? ? ?Review of Glycemic Control ? Latest Reference Range & Units 08/04/21 08:25 08/04/21 12:02 08/04/21 15:51 08/04/21 22:45 08/05/21 08:18  ?Glucose-Capillary 70 - 99 mg/dL 92 243 (H) 182 (H) 133 (H) 50 (L)  ? ?Diabetes history: DM 2 ?Outpatient Diabetes medications: Lantus 10 units ?Current orders for Inpatient glycemic control:  ?Semglee 15 units ?Novolog 0-15 units tid  ?Novolog 5 units tid meal coverage ? ?NOTE: decadron dose reduced yesterday. Hypoglycemia this am. ? ?Inpatient Diabetes Program Recommendations:   ? ?-  Consider reducing Semglee to 10 units ?-  Consider reducing Novolog meal coverage to 2 units tid ? ?Thanks, ? ?Tama Headings RN, MSN, BC-ADM ?Inpatient Diabetes Coordinator ?Team Pager [phone] (8a-5p) ? ? ? ?

## 2021-08-05 NOTE — Progress Notes (Signed)
CSW obtained pending Medicaid number for patient - #[id number] Q. ? ?CSW spoke with [name] of [place] who states she will review patient and return call to [place]. ? ?Madilyn Fireman, MSW, LCSW ?Transitions of Care  Clinical Social Worker II ?[phone] ? ?

## 2021-08-06 DIAGNOSIS — R531 Weakness: Secondary | ICD-10-CM | POA: Diagnosis not present

## 2021-08-06 LAB — GLUCOSE, CAPILLARY
Glucose-Capillary: 130 mg/dL — ABNORMAL HIGH (ref 70–99)
Glucose-Capillary: 276 mg/dL — ABNORMAL HIGH (ref 70–99)
Glucose-Capillary: 126 mg/dL — ABNORMAL HIGH (ref 70–99)
Glucose-Capillary: 200 mg/dL — ABNORMAL HIGH (ref 70–99)

## 2021-08-06 MED ORDER — NAPHAZOLINE-GLYCERIN 0.012-0.25 % OP SOLN
1.0000 [drp] | Freq: Four times a day (QID) | OPHTHALMIC | Status: DC | PRN
  Administered 2021-08-06: 1 [drp] via OPHTHALMIC
  Filled 2021-08-06 (×2): qty 15

## 2021-08-06 NOTE — Progress Notes (Signed)
CSW spoke with [name] of [place] who states the business office at the [facility] facility is reviewing patient's financial assets at this time. ? ?Madilyn Fireman, MSW, LCSW ?Transitions of Care  Clinical Social Worker II ?[phone] ? ?

## 2021-08-06 NOTE — Progress Notes (Signed)
Inpatient Diabetes Program Recommendations ? ?AACE/ADA: New Consensus Statement on Inpatient Glycemic Control (2015) ? ?Target Ranges:  Prepandial:   less than 140 mg/dL ?     Peak postprandial:   less than 180 mg/dL (1-2 hours) ?     Critically ill patients:  140 - 180 mg/dL  ? ?Lab Results  ?Component Value Date  ? GLUCAP 276 (H) 08/06/2021  ? HGBA1C 6.5 05/15/2021  ? ? ?Review of Glycemic Control ? Latest Reference Range & Units 08/06/21 08:19 08/06/21 11:15  ?Glucose-Capillary 70 - 99 mg/dL 130 (H) 276 (H)  ?(H): Data is abnormally high ? ? ?Current orders for Inpatient glycemic control: Semglee 15 QHS, Novolog 5 units TID and decadron 4 mg QD ? ?Inpatient Diabetes Program Recommendations:   ? ?Semglee 10 units QHS, Novolog 7 units TID with meals if eats at least 50% ? ?Will continue to follow while inpatient. ? ?Thank you, ?Reche Dixon, MSN, RN ?Diabetes Coordinator ?Inpatient Diabetes Program ?[phone] (team pager from 8a-5p) ? ? ? ?

## 2021-08-06 NOTE — Progress Notes (Signed)
Mobility Specialist Progress Note: ? ? 08/06/21 1025  ?Mobility  ?Activity Ambulated with assistance in room;Transferred from bed to chair  ?Level of Assistance Minimal assist, patient does 75% or more  ?Assistive Device Front wheel walker  ?Distance Ambulated (ft) 4 ft  ?Activity Response Tolerated well  ?$Mobility charge 1 Mobility  ? ?Pt received in bed willing to participate in mobility. No complaints of pain. MinA to stand from elevated bed and to step to recliner. Requires cues to to step with both feet.Left in chair with call bell in reach and all needs met.   ? ?Irvin Lizama ?Mobility Specialist ?Primary Phone [phone] ? ?

## 2021-08-06 NOTE — Progress Notes (Signed)
?PROGRESS NOTE ? ?Katelyn Kennedy ZOX:[id number] DOB: [birth year] DOA: 07/07/2021 ?PCP: [name], MD ? ? LOS: 27 days  ? ?Brief Narrative / Interim history: ?76 y.o. female with PMH significant for DM2, HTN, PVD, lung cancer with brain mets s/p resection and chemoradiation, DVT/PE on Eliquis. Patient was brought to the ED from home on 3/27 from home after she slid out of bed and fell to the floor. She was being taken care at home by family members over the years of 50 and their own physical limitations.  Family members were not able to pick her up when she fell and hence EMS was called and she was brought to the ED ?In the ED, CT scan of head (compared to CT scan and MRIs from last 2 months) showed extensive dural based hemorrhagic metastatic disease without significant change in extensive vasogenic edema or midline shift small volume extra-axial hemorrhage along the right frontal convexity with superimposed traumatic hemorrhage. Patient was admitted to hospital service. Her hospitalization was prolonged because patient was in a phase of denial of the severity of her condition.  At one point patient tried to leave AMA but she did not have enough family support at home. Family members unable to take her home and provide any support. Pending placement plan ? ?Subjective / 24h Interval events: ?Doing well today. No chest pain, no dyspnea. ? ?Assesement and Plan: ?Principal Problem: ?  Fall at home secondary to weakness ?Active Problems: ?  Diet-controlled type 2 diabetes mellitus ([place]) ?  Vasogenic brain edema ([place]) ?  Chronic anticoagulation ?  Non-small cell lung cancer metastatic to brain [facility]) with vasogenic brain edema ?  Normocytic anemia ?  Leukocytosis ?  History of DVT (deep vein thrombosis) ?  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 ?Principal problem ?Small traumatic subdural hemorrhage ?Non-small cell lung cancer ?Extensive hemorrhagic brain metastasis extensive vasogenic  edema ?-Patient presented after a fall at home. She has a known history of lung cancer with brain mets s/p resection, chemoradiation and recent scans showing extensive hemorrhagic mets and vasogenic edema. New CT head on admission showed superimposed small traumatic subdural hemorrhage. Neurosurgery was consulted and patient was started on Decadron, currently being tapered down. She was transitioned to 4 mg daily which I would favor continue until seen by her neurosurgeon or oncologist. Continue Keppra for seizure prophylaxis. ?-Palliative care consultation was obtained.  Patient declined hospice care. ?-Family unable will take care at home.  Medicaid filed.  Currently pending disposition. ?  ?History of DVT/PE on chronic anticoagulation  -PE was diagnosed on 05/08/2021. Despite hemorrhagic metastasis in brain, patient elects to continue Eliquis.  She understands the high risk of intracranial bleeding.   ?  ?Type II diabetes mellitus ?Hypoglycemia -A1c 6.5 in February 2023. Patient has good appetite and wants to continue regular diet.  Blood sugar level fluctuating. Continue glargine 15 u QHS and Aspart 5U TID + SSI ? ?CBG (last 3)  ?Recent Labs  ?  08/05/21 ?2121 08/06/21 ?8119 08/06/21 ?1115  ?GLUCAP 129* 130* 276*  ? ? ?Scheduled Meds: ? amitriptyline  50 mg Oral QHS  ? apixaban  5 mg Oral BID  ? atorvastatin  40 mg Oral QPM  ? dexamethasone  4 mg Oral Daily  ? famotidine  20 mg Oral Daily  ? insulin aspart  0-15 Units Subcutaneous TID WC  ? insulin aspart  5 Units Subcutaneous TID WC  ? insulin glargine-yfgn  15 Units Subcutaneous QHS  ?  levETIRAcetam  500 mg Oral BID  ? sodium chloride flush  3 mL Intravenous Q12H  ? vitamin B-12  1,000 mcg Oral Daily  ? ?Continuous Infusions: ?PRN Meds:.acetaminophen **OR** acetaminophen, albuterol, docusate sodium, naphazoline-glycerin, polyethylene glycol ? ?Diet Orders (From admission, onward)  ? ?  Start     Ordered  ? 07/09/21 0000  Diet - low sodium heart healthy       ?  07/09/21 1517  ? 07/07/21 1627  Diet regular Room service appropriate? Yes; Fluid consistency: Thin  Diet effective now       ?Question Answer Comment  ?Room service appropriate? Yes   ?Fluid consistency: Thin   ?  ? 07/07/21 1627  ? ?  ?  ? ?  ? ? ?DVT prophylaxis:  ?apixaban (ELIQUIS) tablet 5 mg  ? ?Lab Results  ?Component Value Date  ? PLT 210 07/28/2021  ? ? ?  Code Status: DNR ? ?Family Communication: no family at bedside  ? ?Status is: Inpatient ? ?Remains inpatient appropriate because: placement issues ? ?Level of care: Med-Surg ? ?Consultants:  ?None currently  ? ?Objective: ?Vitals:  ? 08/05/21 1606 08/05/21 2059 08/06/21 0448 08/06/21 0800  ?BP:  (!) 94/54 (!) 104/55 (!) 126/55  ?Pulse: (!) 107 100 91 (!) 103  ?Resp: 18 18 18 16   ?Temp: 98 ?F (36.7 ?C) 97.9 ?F (36.6 ?C) 97.7 ?F (36.5 ?C) 98 ?F (36.7 ?C)  ?TempSrc:  Oral Oral Oral  ?SpO2: 98% 98% 97% 99%  ?Weight:      ?Height:      ? ? ?Intake/Output Summary (Last 24 hours) at 08/06/2021 1359 ?Last data filed at 08/06/2021 0449 ?Gross per 24 hour  ?Intake 240 ml  ?Output 500 ml  ?Net -260 ml  ? ?Wt Readings from Last 3 Encounters:  ?07/08/21 56.4 kg  ?06/11/21 57.6 kg  ?06/10/21 58 kg  ? ? ?Examination: ? ?Constitutional: NAD ?Eyes: no scleral icterus ?ENMT: Mucous membranes are moist.  ?Neck: normal, supple ?Respiratory: clear to auscultation bilaterally, no wheezing, no crackles. Normal respiratory effort.  ?Cardiovascular: Regular rate and rhythm, no murmurs / rubs / gallops. No LE edema.  ?Abdomen: non distended, no tenderness. Bowel sounds positive.  ?Musculoskeletal: no clubbing / cyanosis.  ?Skin: no rashes ?Neurologic: non focal   ? ?Data Reviewed: I have independently reviewed following labs and imaging studies  ? ?CBC ?No results for input(s): WBC, HGB, HCT, PLT, MCV, MCH, MCHC, RDW, LYMPHSABS, MONOABS, EOSABS, BASOSABS, BANDABS in the last 168 hours. ? ?Invalid input(s): NEUTRABS, BANDSABD ? ?No results for input(s): NA, K, CL, CO2, GLUCOSE, BUN,  CREATININE, CALCIUM, AST, ALT, ALKPHOS, BILITOT, ALBUMIN, GLUCOSE, MG, CRP, DDIMER, PROCALCITON, LATICACIDVEN, INR, TSH, CORTISOL, HGBA1C, AMMONIA, BNP in the last 168 hours. ? ?Invalid input(s): GFRCGP, PHOSPHOROUS ? ?------------------------------------------------------------------------------------------------------------------ ?No results for input(s): CHOL, HDL, LDLCALC, TRIG, CHOLHDL, LDLDIRECT in the last 72 hours. ? ?Lab Results  ?Component Value Date  ? HGBA1C 6.5 05/15/2021  ? ?------------------------------------------------------------------------------------------------------------------ ?No results for input(s): TSH, T4TOTAL, T3FREE, THYROIDAB in the last 72 hours. ? ?Invalid input(s): FREET3 ? ?Cardiac Enzymes ?No results for input(s): CKMB, TROPONINI, MYOGLOBIN in the last 168 hours. ? ?Invalid input(s): CK ?------------------------------------------------------------------------------------------------------------------ ?No results found for: BNP ? ?CBG: ?Recent Labs  ?Lab 08/05/21 ?1605 08/05/21 ?1810 08/05/21 ?2121 08/06/21 ?5916 08/06/21 ?1115  ?GLUCAP 465* 325* 129* 130* 276*  ? ? ?No results found for this or any previous visit (from the past 240 hour(s)).  ? ?Radiology Studies: ?No results found. ? ? ?Calhoun Reichardt,  MD, PhD ?[facility] ? ?Between 7 am - 7 pm I am available, please contact me via Amion (for emergencies) or Securechat (non urgent messages) ? ?Between 7 pm - 7 am I am not available, please contact night coverage MD/APP via Amion ? ?

## 2021-08-06 NOTE — Progress Notes (Signed)
Occupational Therapy Treatment and Discharge ?Patient Details ?Name: Katelyn Kennedy ?MRN: [id number] ?DOB: [birth year] ?Today's Date: 08/06/2021 ? ? ?History of present illness Pt is a 76 y.o. female admitted 07/07/21 after fall out of bed with reports of worsening L-side weakness related to brain metastases with prior resection. Head CT showing continued presence of brain mets with vasogenic edema and leftward shift of the brain. 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 ?  ?OT comments ? Pt appears to have reached a plateau in ADLs and mobility. Continue to recommend SNF for custodial care. No further acute OT needs.   ? ?Recommendations for follow up therapy are one component of a multi-disciplinary discharge planning process, led by the attending physician.  Recommendations may be updated based on patient status, additional functional criteria and insurance authorization. ?   ?Follow Up Recommendations ? Skilled nursing-short term rehab (<3 hours/day)  ?  ?Assistance Recommended at Discharge Frequent or constant Supervision/Assistance  ?Patient can return home with the following ? A lot of help with walking and/or transfers;A lot of help with bathing/dressing/bathroom;Assistance with cooking/housework;Direct supervision/assist for medications management;Direct supervision/assist for financial management;Assist for transportation;Help with stairs or ramp for entrance ?  ?Equipment Recommendations ? Other (comment) (defer to next venue)  ?  ?Recommendations for Other Services   ? ?  ?Precautions / Restrictions Precautions ?Precautions: Fall ?Precaution Comments: L side weakness and inattention ?Restrictions ?Weight Bearing Restrictions: No  ? ? ?  ? ?Mobility Bed Mobility ?Overal bed mobility: Needs Assistance ?Bed Mobility: Sit to Supine ?  ?  ?  ?Sit to supine: Max assist ?  ?General bed mobility comments: max a to bring LE into  bed and guide trunk toward [facility] ?  ? ?Transfers ?Overall transfer level: Needs assistance ?Equipment used: Rolling walker (2 wheels) ?Transfers: Bed to chair/wheelchair/BSC ?Sit to Stand: Mod assist ?Stand pivot transfers: Mod assist ?  ?  ?  ?  ?General transfer comment: assist to rise and steady, pt with tendency to attempt to sit before aligned with sitting surface ?  ?  ?Balance Overall balance assessment: Needs assistance ?  ?Sitting balance-Leahy Scale: Fair ?  ?  ?Standing balance support: Bilateral upper extremity supported ?Standing balance-Leahy Scale: Poor ?  ?  ?  ?  ?  ?  ?  ?  ?  ?  ?  ?  ?   ? ?ADL either performed or assessed with clinical judgement  ? ?ADL Overall ADL's : Needs assistance/impaired ?Eating/Feeding: Set up;Sitting ?  ?Grooming: Wash/dry hands;Wash/dry face;Sitting;Set up ?  ?  ?  ?  ?  ?  ?  ?  ?  ?Toilet Transfer: Moderate assistance;Stand-pivot;BSC/3in1;Rolling walker (2 wheels) ?  ?Toileting- Clothing Manipulation and Hygiene: Total assistance;Sit to/from stand ?  ?  ?  ?  ?  ?  ? ?Extremity/Trunk Assessment   ?  ?  ?  ?  ?  ? ?Vision   ?  ?  ?Perception   ?  ?Praxis   ?  ? ?Cognition Arousal/Alertness: Awake/alert ?Behavior During Therapy: Flat affect ?Overall Cognitive Status: Impaired/Different from baseline ?Area of Impairment: Orientation, Attention, Memory, Following commands, Safety/judgement, Awareness, Problem solving ?  ?  ?  ?  ?  ?  ?  ?  ?Orientation Level: Disoriented to, Time, Situation ?Current Attention Level: Sustained ?Memory: Decreased short-term memory ?Following Commands: Follows one step commands with increased time ?Safety/Judgement: Decreased awareness of safety, Decreased  awareness of deficits ?Awareness: Emergent ?Problem Solving: Slow processing, Requires verbal cues, Decreased initiation ?General Comments: frequent cues for mobility and posture during mobility ?  ?  ?   ?Exercises   ? ?  ?Shoulder Instructions   ? ? ?  ?General Comments    ? ? ?Pertinent  Vitals/ Pain       Pain Assessment ?Pain Assessment: No/denies pain ? ?Home Living   ?  ?  ?  ?  ?  ?  ?  ?  ?  ?  ?  ?  ?  ?  ?  ?  ?  ?  ? ?  ?Prior Functioning/Environment    ?  ?  ?  ?   ? ?Frequency ?    ? ? ? ? ?  ?Progress Toward Goals ? ?OT Goals(current goals can now be found in the care plan section) ? Progress towards OT goals: Not progressing toward goals - comment (plateaued) ? ?Acute Rehab OT Goals ?OT Goal Formulation: With patient ?Time For Goal Achievement: 07/23/21  ?Plan Discharge plan remains appropriate   ? ?Co-evaluation ? ? ?   ?  ?  ?  ?  ? ?  ?AM-PAC OT "6 Clicks" Daily Activity     ?Outcome Measure ? ? Help from another person eating meals?: A Little ?Help from another person taking care of personal grooming?: A Little ?Help from another person toileting, which includes using toliet, bedpan, or urinal?: Total ?Help from another person bathing (including washing, rinsing, drying)?: A Lot ?Help from another person to put on and taking off regular upper body clothing?: A Lot ?Help from another person to put on and taking off regular lower body clothing?: Total ?6 Click Score: 12 ? ?  ?End of Session Equipment Utilized During Treatment: Rolling walker (2 wheels);Gait belt ? ?OT Visit Diagnosis: Other abnormalities of gait and mobility (R26.89);Unsteadiness on feet (R26.81);Muscle weakness (generalized) (M62.81);Hemiplegia and hemiparesis;Other symptoms and signs involving cognitive function ?Hemiplegia - Right/Left: Left ?Hemiplegia - dominant/non-dominant: Non-Dominant ?  ?Activity Tolerance Patient tolerated treatment well ?  ?Patient Left in bed;with call bell/phone within reach;with bed alarm set ?  ?Nurse Communication   ?  ? ?   ? ?Time: 4401-0272 ?OT Time Calculation (min): 21 min ? ?Charges: OT General Charges ?$OT Visit: 1 Visit ?OT Treatments ?$Self Care/Home Management : 8-22 mins ? ?Nestor Lewandowsky, OTR/L ?Acute Rehabilitation Services ?Pager: [phone] ?Office: [phone]   ? ?[name] ?08/06/2021, 2:57 PM ?

## 2021-08-07 DIAGNOSIS — R531 Weakness: Secondary | ICD-10-CM | POA: Diagnosis not present

## 2021-08-07 LAB — GLUCOSE, CAPILLARY
Glucose-Capillary: 107 mg/dL — ABNORMAL HIGH (ref 70–99)
Glucose-Capillary: 81 mg/dL (ref 70–99)
Glucose-Capillary: 255 mg/dL — ABNORMAL HIGH (ref 70–99)
Glucose-Capillary: 110 mg/dL — ABNORMAL HIGH (ref 70–99)
Glucose-Capillary: 241 mg/dL — ABNORMAL HIGH (ref 70–99)
Glucose-Capillary: 222 mg/dL — ABNORMAL HIGH (ref 70–99)

## 2021-08-07 NOTE — Progress Notes (Signed)
Mobility Specialist Progress Note: ? ? 08/07/21 1010  ?Mobility  ?Activity Ambulated with assistance in room;Transferred from bed to chair  ?Level of Assistance Moderate assist, patient does 50-74%  ?Assistive Device Front wheel walker  ?Distance Ambulated (ft) 4 ft  ?Activity Response Tolerated well  ?$Mobility charge 1 Mobility  ? ?Pt received in bed willing to participate in mobility. Complaints of leg soreness. ModA to stand and step to recliner. Left in recliner with call bell in reach and all needs met.  ? ?Katelyn Kennedy ?Mobility Specialist ?Primary Phone [phone] ? ?

## 2021-08-07 NOTE — Progress Notes (Addendum)
11am: ?CSW spoke with [place] at [facility] who states she cannot offer the patient a bed. ? ?CSW sent updated clinicals to [place], [place], [place], and [place] for review. ? ?CSW attempted to reach [place] at [facility] without success - a text was sent requesting a return call. ? ?CSW spoke with Jackelyn Poling at [facility] to request she review patient for possible admission. ? ?CSW attempted to reach admissions at [facility] in [place] without success - a voicemail was left requesting a return call. ? ?8am: ?CSW spoke with [name] at [facility] who states the [facility] facility is at capacity for [employer]. ? ?CSW will attempt to obtain bed offers from different facilities. ? ?CSW spoke with Juliann Pulse at [facility] who states [facility] has bed availability - clinicals sent to [facility] for review. ? ?Madilyn Fireman, MSW, LCSW ?Transitions of Care  Clinical Social Worker II ?[phone] ? ?

## 2021-08-07 NOTE — Progress Notes (Signed)
?PROGRESS NOTE ? ?Katelyn Kennedy DGL:[id number] DOB: 06/19/1945 DOA: 07/07/2021 ?PCP: [name], MD ? ? LOS: 28 days  ? ?Brief Narrative / Interim history: ?76 y.o. female with PMH significant for DM2, HTN, PVD, lung cancer with brain mets s/p resection and chemoradiation, DVT/PE on Eliquis. Patient was brought to the ED from home on 3/27 from home after she slid out of bed and fell to the floor. She was being taken care at home by family members over the years of 42 and their own physical limitations.  Family members were not able to pick her up when she fell and hence EMS was called and she was brought to the ED. In the ED, CT scan of head (compared to CT scan and MRIs from last 2 months) showed extensive dural based hemorrhagic metastatic disease without significant change in extensive vasogenic edema or midline shift small volume extra-axial hemorrhage along the right frontal convexity with superimposed traumatic hemorrhage. Patient was admitted to hospital service. Her hospitalization was prolonged because patient was in a phase of denial of the severity of her condition.  At one point patient tried to leave AMA but she did not have enough family support at home. Family members unable to take her home and provide any support. Pending placement plan ? ?Subjective / 24h Interval events: ?Complains of some cramping in her legs yesterday after working with PT ? ?Assesement and Plan: ?Principal Problem: ?  Fall at home secondary to weakness ?Active Problems: ?  Diet-controlled type 2 diabetes mellitus ([place]) ?  Vasogenic brain edema ([place]) ?  Chronic anticoagulation ?  Non-small cell lung cancer metastatic to brain [facility]) with vasogenic brain edema ?  Normocytic anemia ?  Leukocytosis ?  History of DVT (deep vein thrombosis) ?  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 ?Principal problem ?Small traumatic subdural hemorrhage ?Non-small cell lung cancer ?Extensive hemorrhagic brain  metastasis extensive vasogenic edema ?-Patient presented after a fall at home. She has a known history of lung cancer with brain mets s/p resection, chemoradiation and recent scans showing extensive hemorrhagic mets and vasogenic edema. New CT head on admission showed superimposed small traumatic subdural hemorrhage. Neurosurgery was consulted and patient was started on Decadron, currently being tapered down. She was transitioned to 4 mg daily which I would favor continue until seen by her neurosurgeon or oncologist. Continue Keppra for seizure prophylaxis. ?-Palliative care consultation was obtained.  Patient declined hospice care. ?-Family unable will take care at home.  Medicaid filed.  Disposition pending ?  ?History of DVT/PE on chronic anticoagulation  -PE was diagnosed on 05/08/2021. Despite hemorrhagic metastasis in brain, patient elects to continue Eliquis.  She understands the high risk of intracranial bleeding.   ?  ?Type II diabetes mellitus ?Hypoglycemia -A1c 6.5 in February 2023. Patient has good appetite and wants to continue regular diet.  Blood sugar level fluctuating. Continue glargine 15 u QHS and Aspart 5U TID + SSI ? ?CBG (last 3)  ?Recent Labs  ?  08/06/21 ?1717 08/06/21 ?2032 08/07/21 ?5188  ?GLUCAP 126* 200* 255*  ? ? ? ?Scheduled Meds: ? amitriptyline  50 mg Oral QHS  ? apixaban  5 mg Oral BID  ? atorvastatin  40 mg Oral QPM  ? dexamethasone  4 mg Oral Daily  ? famotidine  20 mg Oral Daily  ? insulin aspart  0-15 Units Subcutaneous TID WC  ? insulin aspart  5 Units Subcutaneous TID WC  ? insulin glargine-yfgn  15  Units Subcutaneous QHS  ? levETIRAcetam  500 mg Oral BID  ? sodium chloride flush  3 mL Intravenous Q12H  ? vitamin B-12  1,000 mcg Oral Daily  ? ?Continuous Infusions: ?PRN Meds:.acetaminophen **OR** acetaminophen, albuterol, docusate sodium, naphazoline-glycerin, polyethylene glycol ? ?Diet Orders (From admission, onward)  ? ?  Start     Ordered  ? 07/09/21 0000  Diet - low sodium  heart healthy       ? 07/09/21 1517  ? 07/07/21 1627  Diet regular Room service appropriate? Yes; Fluid consistency: Thin  Diet effective now       ?Question Answer Comment  ?Room service appropriate? Yes   ?Fluid consistency: Thin   ?  ? 07/07/21 1627  ? ?  ?  ? ?  ? ? ?DVT prophylaxis:  ?apixaban (ELIQUIS) tablet 5 mg  ? ?Lab Results  ?Component Value Date  ? PLT 210 07/28/2021  ? ? ?  Code Status: DNR ? ?Family Communication: no family at bedside  ? ?Status is: Inpatient ? ?Remains inpatient appropriate because: placement issues ? ?Level of care: Med-Surg ? ?Consultants:  ?None currently  ? ?Objective: ?Vitals:  ? 08/06/21 0800 08/06/21 1651 08/07/21 0408 08/07/21 0800  ?BP: (!) 126/55 121/61 114/63 114/67  ?Pulse: (!) 103 (!) 108 (!) 102 (!) 104  ?Resp: 16 16 18 20   ?Temp: 98 ?F (36.7 ?C) 98.5 ?F (36.9 ?C) 98 ?F (36.7 ?C) 98.8 ?F (37.1 ?C)  ?TempSrc: Oral Oral Oral Oral  ?SpO2: 99% 100% 99% 100%  ?Weight:      ?Height:      ? ? ?Intake/Output Summary (Last 24 hours) at 08/07/2021 1024 ?Last data filed at 08/07/2021 0408 ?Gross per 24 hour  ?Intake --  ?Output 650 ml  ?Net -650 ml  ? ? ?Wt Readings from Last 3 Encounters:  ?07/08/21 56.4 kg  ?06/11/21 57.6 kg  ?06/10/21 58 kg  ? ? ?Examination: ? ?Constitutional: NAD ?Respiratory: CTA ?Cardiovascular: RRR   ? ?Data Reviewed: I have independently reviewed following labs and imaging studies  ? ?CBC ?No results for input(s): WBC, HGB, HCT, PLT, MCV, MCH, MCHC, RDW, LYMPHSABS, MONOABS, EOSABS, BASOSABS, BANDABS in the last 168 hours. ? ?Invalid input(s): NEUTRABS, BANDSABD ? ?No results for input(s): NA, K, CL, CO2, GLUCOSE, BUN, CREATININE, CALCIUM, AST, ALT, ALKPHOS, BILITOT, ALBUMIN, GLUCOSE, MG, CRP, DDIMER, PROCALCITON, LATICACIDVEN, INR, TSH, CORTISOL, HGBA1C, AMMONIA, BNP in the last 168 hours. ? ?Invalid input(s): GFRCGP, PHOSPHOROUS ? ?------------------------------------------------------------------------------------------------------------------ ?No results  for input(s): CHOL, HDL, LDLCALC, TRIG, CHOLHDL, LDLDIRECT in the last 72 hours. ? ?Lab Results  ?Component Value Date  ? HGBA1C 6.5 05/15/2021  ? ?------------------------------------------------------------------------------------------------------------------ ?No results for input(s): TSH, T4TOTAL, T3FREE, THYROIDAB in the last 72 hours. ? ?Invalid input(s): FREET3 ? ?Cardiac Enzymes ?No results for input(s): CKMB, TROPONINI, MYOGLOBIN in the last 168 hours. ? ?Invalid input(s): CK ?------------------------------------------------------------------------------------------------------------------ ?No results found for: BNP ? ?CBG: ?Recent Labs  ?Lab 08/06/21 ?0175 08/06/21 ?1115 08/06/21 ?1717 08/06/21 ?2032 08/07/21 ?1025  ?GLUCAP 130* 276* 126* 200* 255*  ? ? ? ?No results found for this or any previous visit (from the past 240 hour(s)).  ? ?Radiology Studies: ?No results found. ? ? ?Marzetta Board, MD, PhD ?[facility] ? ?Between 7 am - 7 pm I am available, please contact me via Amion (for emergencies) or Securechat (non urgent messages) ? ?Between 7 pm - 7 am I am not available, please contact night coverage MD/APP via Amion ? ?

## 2021-08-07 NOTE — Progress Notes (Signed)
Physical Therapy Treatment ?Patient Details ?Name: Katelyn Kennedy ?MRN: [id number] ?DOB: January 10, 1946 ?Today's Date: 08/07/2021 ? ? ?History of Present Illness Pt is a 76 y.o. female admitted 07/07/21 after fall out of bed with reports of worsening L-side weakness related to brain metastases with prior resection. Head CT showing continued presence of brain mets with vasogenic edema and leftward shift of the brain. 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 ? ?  ?PT Comments  ? ? Pt received supine with max c/o fatigue from being OOB in recliner prior. Pt declining further OOB mobility secondary to fatigue and agreeable to bed level therex for increased strength and ROM with fair tolerance. Pt continues with most difficulty maintaining midline as pt with tendency for left lateral lean and left inattention, pt able to correct with min assist and maintain intermittently throughout session. Pt continues to benefit from skilled PT services to progress toward functional mobility goals.  ?  ?Recommendations for follow up therapy are one component of a multi-disciplinary discharge planning process, led by the attending physician.  Recommendations may be updated based on patient status, additional functional criteria and insurance authorization. ? ?Follow Up Recommendations ? Skilled nursing-short term rehab (<3 hours/day) ?  ?  ?Assistance Recommended at Discharge Frequent or constant Supervision/Assistance  ?Patient can return home with the following A lot of help with bathing/dressing/bathroom;Assistance with cooking/housework;Direct supervision/assist for financial management;Direct supervision/assist for medications management;Assist for transportation;Help with stairs or ramp for entrance;Two people to help with walking and/or transfers ?  ?Equipment Recommendations ? Wheelchair (measurements PT);Wheelchair cushion (measurements  PT);Hospital bed;Other (comment)  ?  ?Recommendations for Other Services   ? ? ?  ?Precautions / Restrictions Precautions ?Precautions: Fall ?Precaution Comments: L side weakness and inattention ?Restrictions ?Weight Bearing Restrictions: No  ?  ? ?Mobility ? Bed Mobility ?Overal bed mobility: Needs Assistance ?  ?  ?  ?  ?  ?  ?General bed mobility comments: assist to reposition and come to/maintain midline ?  ? ?Transfers ?Overall transfer level: Needs assistance ?  ?  ?  ?  ?  ?  ?  ?  ?  ?  ? ?Ambulation/Gait ?  ?  ?  ?  ?  ?  ?  ?  ? ? ?Stairs ?  ?  ?  ?  ?  ? ? ?Wheelchair Mobility ?  ? ?Modified Rankin (Stroke Patients Only) ?  ? ? ?  ?Balance Overall balance assessment: Needs assistance ?  ?Sitting balance-Leahy Scale: Fair ?  ?  ?Standing balance support: Bilateral upper extremity supported ?Standing balance-Leahy Scale: Poor ?  ?  ?  ?  ?  ?  ?  ?  ?  ?  ?  ?  ?  ? ?  ?Cognition Arousal/Alertness: Awake/alert ?Behavior During Therapy: Flat affect ?Overall Cognitive Status: Impaired/Different from baseline ?Area of Impairment: Orientation, Attention, Memory, Following commands, Safety/judgement, Awareness, Problem solving ?  ?  ?  ?  ?  ?  ?  ?  ?Orientation Level: Disoriented to, Time, Situation ?Current Attention Level: Sustained ?Memory: Decreased short-term memory ?Following Commands: Follows one step commands with increased time ?Safety/Judgement: Decreased awareness of safety, Decreased awareness of deficits ?Awareness: Emergent ?Problem Solving: Slow processing, Requires verbal cues, Decreased initiation ?General Comments: frequent cues for mobility and posture during mobility ?  ?  ? ?  ?Exercises General Exercises - Lower Extremity ?Ankle Circles/Pumps: Right, Left, 10 reps ?Heel  Slides: Right, Left, 20 reps ?Hip ABduction/ADduction: Right, Left, Both, 20 reps (+pillow squeeze x10) ?Straight Leg Raises: Right, Left, 20 reps ?Other Exercises ?Other Exercises: B shoulder extension and flexion with  deep breathing in supine x10 for stretching ?Other Exercises: trunk rotation+reaching across body x10 ea way ?Other Exercises: modified sit up + reaching forward ? ?  ?General Comments   ?  ?  ? ?Pertinent Vitals/Pain Pain Assessment ?Pain Assessment: No/denies pain  ? ? ?Home Living   ?  ?  ?  ?  ?  ?  ?  ?  ?  ?   ?  ?Prior Function    ?  ?  ?   ? ?PT Goals (current goals can now be found in the care plan section) Acute Rehab PT Goals ?PT Goal Formulation: With patient ?Time For Goal Achievement: 07/29/21 ? ?  ?Frequency ? ? ? Min 2X/week ? ? ? ?  ?PT Plan Current plan remains appropriate  ? ? ?Co-evaluation   ?  ?  ?  ?  ? ?  ?AM-PAC PT "6 Clicks" Mobility   ?Outcome Measure ? Help needed turning from your back to your side while in a flat bed without using bedrails?: A Lot ?Help needed moving from lying on your back to sitting on the side of a flat bed without using bedrails?: A Lot ?Help needed moving to and from a bed to a chair (including a wheelchair)?: Total ?Help needed standing up from a chair using your arms (e.g., wheelchair or bedside chair)?: Total ?Help needed to walk in hospital room?: Total ?Help needed climbing 3-5 steps with a railing? : Total ?6 Click Score: 8 ? ?  ?End of Session Equipment Utilized During Treatment: Gait belt ?Activity Tolerance: Patient limited by fatigue ?Patient left: in bed;with call bell/phone within reach;with bed alarm set ?Nurse Communication: Mobility status ?PT Visit Diagnosis: Unsteadiness on feet (R26.81);Muscle weakness (generalized) (M62.81);Repeated falls (R29.6);History of falling (Z91.81) ?  ? ? ?Time: 0354-6568 ?PT Time Calculation (min) (ACUTE ONLY): 16 min ? ?Charges:  $Therapeutic Exercise: 8-22 mins          ?          ? ?Audry Riles. PTA ?Acute Rehabilitation Services ?Office: [phone] ? ? ? ?Betsey Holiday Addie Alonge ?08/07/2021, 1:44 PM ? ?

## 2021-08-07 NOTE — Progress Notes (Signed)
[facility]) Hospital Liaison note: ? ?This patient has been referred to [facility] Outpatient  Palliative Care services. Will continue to follow for disposition. ? ?Please call with any outpatient palliative questions or concerns. ? ?Thank you for the opportunity to participate in this patient's care. ?  ?Thank you, ?Lorelee Market, LPN ?[facility] Hospital Liaison ?[phone] ?

## 2021-08-08 DIAGNOSIS — R531 Weakness: Secondary | ICD-10-CM | POA: Diagnosis not present

## 2021-08-08 LAB — BASIC METABOLIC PANEL
Anion gap: 6 (ref 5–15)
BUN: 22 mg/dL (ref 8–23)
CO2: 25 mmol/L (ref 22–32)
Calcium: 8.8 mg/dL — ABNORMAL LOW (ref 8.9–10.3)
Chloride: 110 mmol/L (ref 98–111)
Creatinine, Ser: 0.59 mg/dL (ref 0.44–1.00)
GFR, Estimated: >60 mL/min (ref >60)
Glucose, Bld: 145 mg/dL — ABNORMAL HIGH (ref 70–99)
Potassium: 3.9 mmol/L (ref 3.5–5.1)
Sodium: 141 mmol/L (ref 135–145)

## 2021-08-08 LAB — GLUCOSE, CAPILLARY
Glucose-Capillary: 96 mg/dL (ref 70–99)
Glucose-Capillary: 213 mg/dL — ABNORMAL HIGH (ref 70–99)
Glucose-Capillary: 370 mg/dL — ABNORMAL HIGH (ref 70–99)
Glucose-Capillary: 77 mg/dL (ref 70–99)

## 2021-08-08 NOTE — Progress Notes (Addendum)
10:40am: ?Patient denied admission to [facility]. ? ?8:30am: ?CSW was advised to send patient's clinicals to [facility] for review. Clinicals were sent via the HUB for review. ? ?Madilyn Fireman, MSW, LCSW ?Transitions of Care  Clinical Social Worker II ?[phone] ? ?

## 2021-08-08 NOTE — Progress Notes (Signed)
Mobility Specialist Progress Note: ? ? 08/08/21 1015  ?Mobility  ?Activity Ambulated with assistance in room;Transferred from bed to chair  ?Level of Assistance Minimal assist, patient does 75% or more  ?Assistive Device Front wheel walker  ?Distance Ambulated (ft) 4 ft  ?Activity Response Tolerated well  ?$Mobility charge 1 Mobility  ? ?Pt received in bed willing to participate in mobility. Complaints of leg soreness. MinA to stand and step to recliner. Left in chair with call bell in reach and all needs met.  ? ?Bianca Raneri ?Mobility Specialist ?Primary Phone [phone] ? ?

## 2021-08-08 NOTE — Progress Notes (Signed)
Mobility Specialist Progress Note: ? ? 08/08/21 1459  ?Mobility  ?Activity Ambulated with assistance in room;Transferred from chair to bed  ?Level of Assistance Moderate assist, patient does 50-74%  ?Assistive Device Front wheel walker  ?Distance Ambulated (ft) 4 ft  ?Activity Response Tolerated well  ?$Mobility charge 1 Mobility  ? ?Pt received in chair asking to go back to bed. No complaints of pain. Left in bed with call bell in reach and all needs met.  ? ?Katelyn Kennedy ?Mobility Specialist ?Primary Phone [phone] ? ?

## 2021-08-08 NOTE — Progress Notes (Signed)
?PROGRESS NOTE ? ?Katelyn Kennedy WFU:[id number] DOB: 1945-10-01 DOA: 07/07/2021 ?PCP: [name], MD ? ? LOS: 29 days  ? ?Brief Narrative / Interim history: ?76 y.o. female with PMH significant for DM2, HTN, PVD, lung cancer with brain mets s/p resection and chemoradiation, DVT/PE on Eliquis. Patient was brought to the ED from home on 3/27 from home after she slid out of bed and fell to the floor. She was being taken care at home by family members over the years of 61 and their own physical limitations.  Family members were not able to pick her up when she fell and hence EMS was called and she was brought to the ED. In the ED, CT scan of head (compared to CT scan and MRIs from last 2 months) showed extensive dural based hemorrhagic metastatic disease without significant change in extensive vasogenic edema or midline shift small volume extra-axial hemorrhage along the right frontal convexity with superimposed traumatic hemorrhage. Patient was admitted to hospital service. Her hospitalization was prolonged because patient was in a phase of denial of the severity of her condition.  At one point patient tried to leave AMA but she did not have enough family support at home. Family members unable to take her home and provide any support. Pending placement plan ? ?Subjective / 24h Interval events: ?About to be moved from bed to chair.  No complaints. ? ?Assesement and Plan: ?Principal Problem: ?  Fall at home secondary to weakness ?Active Problems: ?  Diet-controlled type 2 diabetes mellitus ([place]) ?  Vasogenic brain edema ([place]) ?  Chronic anticoagulation ?  Non-small cell lung cancer metastatic to brain [facility]) with vasogenic brain edema ?  Normocytic anemia ?  Leukocytosis ?  History of DVT (deep vein thrombosis) ?  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 ?Principal problem ?Small traumatic subdural hemorrhage ?Non-small cell lung cancer ?Extensive hemorrhagic brain metastasis extensive  vasogenic edema ?-Patient presented after a fall at home. She has a known history of lung cancer with brain mets s/p resection, chemoradiation and recent scans showing extensive hemorrhagic mets and vasogenic edema. New CT head on admission showed superimposed small traumatic subdural hemorrhage. Neurosurgery was consulted and patient was started on Decadron, currently being tapered down. She was transitioned to 4 mg daily which I would favor continue until seen by her neurosurgeon or oncologist. Continue Keppra for seizure prophylaxis. ?-Palliative care consultation was obtained.  Patient declined hospice care. ?-Family unable will take care at home.  Medicaid filed.  Disposition pending ?  ?History of DVT/PE on chronic anticoagulation  -PE was diagnosed on 05/08/2021. Despite hemorrhagic metastasis in brain, patient elects to continue Eliquis.  She understands the high risk of intracranial bleeding.   ?  ?Type II diabetes mellitus ?Hypoglycemia -A1c 6.5 in February 2023. Patient has good appetite and wants to continue regular diet.  Blood sugar level fluctuating. Continue glargine 15 u QHS and Aspart 5U TID + SSI ? ?CBG (last 3)  ?Recent Labs  ?  08/07/21 ?2030 08/08/21 ?2025 08/08/21 ?1134  ?GLUCAP 222* 96 213*  ? ? ? ?Scheduled Meds: ? amitriptyline  50 mg Oral QHS  ? apixaban  5 mg Oral BID  ? atorvastatin  40 mg Oral QPM  ? dexamethasone  4 mg Oral Daily  ? famotidine  20 mg Oral Daily  ? insulin aspart  0-15 Units Subcutaneous TID WC  ? insulin aspart  5 Units Subcutaneous TID WC  ? insulin glargine-yfgn  15 Units  Subcutaneous QHS  ? levETIRAcetam  500 mg Oral BID  ? sodium chloride flush  3 mL Intravenous Q12H  ? vitamin B-12  1,000 mcg Oral Daily  ? ?Continuous Infusions: ?PRN Meds:.acetaminophen **OR** acetaminophen, albuterol, docusate sodium, naphazoline-glycerin, polyethylene glycol ? ?Diet Orders (From admission, onward)  ? ?  Start     Ordered  ? 07/09/21 0000  Diet - low sodium heart healthy       ?  07/09/21 1517  ? 07/07/21 1627  Diet regular Room service appropriate? Yes; Fluid consistency: Thin  Diet effective now       ?Question Answer Comment  ?Room service appropriate? Yes   ?Fluid consistency: Thin   ?  ? 07/07/21 1627  ? ?  ?  ? ?  ? ? ?DVT prophylaxis:  ?apixaban (ELIQUIS) tablet 5 mg  ? ?Lab Results  ?Component Value Date  ? PLT 210 07/28/2021  ? ? ?  Code Status: DNR ? ?Family Communication: no family at bedside  ? ?Status is: Inpatient ? ?Remains inpatient appropriate because: placement issues ? ?Level of care: Med-Surg ? ?Consultants:  ?None currently  ? ?Objective: ?Vitals:  ? 08/07/21 0800 08/07/21 2032 08/07/21 2111 08/08/21 0544  ?BP: 114/67 (!) 98/55 (!) 100/51 117/69  ?Pulse: (!) 104 (!) 104 100 91  ?Resp: 20 18 16 17   ?Temp: 98.8 ?F (37.1 ?C) 98.1 ?F (36.7 ?C) (!) 97.5 ?F (36.4 ?C) 97.9 ?F (36.6 ?C)  ?TempSrc: Oral Oral Oral Oral  ?SpO2: 100% 96% 98% 100%  ?Weight:      ?Height:      ? ? ?Intake/Output Summary (Last 24 hours) at 08/08/2021 1203 ?Last data filed at 08/08/2021 [phone] ?Gross per 24 hour  ?Intake 240 ml  ?Output 3050 ml  ?Net -2810 ml  ? ? ?Wt Readings from Last 3 Encounters:  ?07/08/21 56.4 kg  ?06/11/21 57.6 kg  ?06/10/21 58 kg  ? ? ?Examination: ? ?Constitutional: NAD ?Respiratory: CTA ?Cardiovascular: Regular rate and rhythm ? ?Data Reviewed: I have independently reviewed following labs and imaging studies  ? ?CBC ?No results for input(s): WBC, HGB, HCT, PLT, MCV, MCH, MCHC, RDW, LYMPHSABS, MONOABS, EOSABS, BASOSABS, BANDABS in the last 168 hours. ? ?Invalid input(s): NEUTRABS, BANDSABD ? ?Recent Labs  ?Lab 08/08/21 ?0050  ?NA 141  ?K 3.9  ?CL 110  ?CO2 25  ?GLUCOSE 145*  ?BUN 22  ?CREATININE 0.59  ?CALCIUM 8.8*  ? ? ?------------------------------------------------------------------------------------------------------------------ ?No results for input(s): CHOL, HDL, LDLCALC, TRIG, CHOLHDL, LDLDIRECT in the last 72 hours. ? ?Lab Results  ?Component Value Date  ? HGBA1C 6.5  05/15/2021  ? ?------------------------------------------------------------------------------------------------------------------ ?No results for input(s): TSH, T4TOTAL, T3FREE, THYROIDAB in the last 72 hours. ? ?Invalid input(s): FREET3 ? ?Cardiac Enzymes ?No results for input(s): CKMB, TROPONINI, MYOGLOBIN in the last 168 hours. ? ?Invalid input(s): CK ?------------------------------------------------------------------------------------------------------------------ ?No results found for: BNP ? ?CBG: ?Recent Labs  ?Lab 08/07/21 ?1129 08/07/21 ?1643 08/07/21 ?2030 08/08/21 ?9163 08/08/21 ?1134  ?GLUCAP 110* 241* 222* 96 213*  ? ? ? ?No results found for this or any previous visit (from the past 240 hour(s)).  ? ?Radiology Studies: ?No results found. ? ? ?Marzetta Board, MD, PhD ?[facility] ? ?Between 7 am - 7 pm I am available, please contact me via Amion (for emergencies) or Securechat (non urgent messages) ? ?Between 7 pm - 7 am I am not available, please contact night coverage MD/APP via Amion ? ?

## 2021-08-09 DIAGNOSIS — R531 Weakness: Secondary | ICD-10-CM | POA: Diagnosis not present

## 2021-08-09 LAB — GLUCOSE, CAPILLARY
Glucose-Capillary: 74 mg/dL (ref 70–99)
Glucose-Capillary: 107 mg/dL — ABNORMAL HIGH (ref 70–99)
Glucose-Capillary: 356 mg/dL — ABNORMAL HIGH (ref 70–99)
Glucose-Capillary: 268 mg/dL — ABNORMAL HIGH (ref 70–99)

## 2021-08-09 NOTE — Progress Notes (Signed)
?PROGRESS NOTE ? ?Katelyn Kennedy TIR:[id number] DOB: March 20, 1946 DOA: 07/07/2021 ?PCP: [name], MD ? ? LOS: 30 days  ? ?Brief Narrative / Interim history: ?76 y.o. female with PMH significant for DM2, HTN, PVD, lung cancer with brain mets s/p resection and chemoradiation, DVT/PE on Eliquis. Patient was brought to the ED from home on 3/27 from home after she slid out of bed and fell to the floor. She was being taken care at home by family members over the years of 72 and their own physical limitations.  Family members were not able to pick her up when she fell and hence EMS was called and she was brought to the ED. In the ED, CT scan of head (compared to CT scan and MRIs from last 2 months) showed extensive dural based hemorrhagic metastatic disease without significant change in extensive vasogenic edema or midline shift small volume extra-axial hemorrhage along the right frontal convexity with superimposed traumatic hemorrhage. Patient was admitted to hospital service. Her hospitalization was prolonged because patient was in a phase of denial of the severity of her condition.  At one point patient tried to leave AMA but she did not have enough family support at home. Family members unable to take her home and provide any support. Pending placement plan ? ?Subjective / 24h Interval events: ?Complains of ongoing weakness ? ?Assesement and Plan: ?Principal Problem: ?  Fall at home secondary to weakness ?Active Problems: ?  Diet-controlled type 2 diabetes mellitus ([place]) ?  Vasogenic brain edema ([employer]) ?  Chronic anticoagulation ?  Non-small cell lung cancer metastatic to brain [facility]) with vasogenic brain edema ?  Normocytic anemia ?  Leukocytosis ?  History of DVT (deep vein thrombosis) ?  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 ?Principal problem ?Small traumatic subdural hemorrhage ?Non-small cell lung cancer ?Extensive hemorrhagic brain metastasis extensive vasogenic edema ?-Patient  presented after a fall at home. She has a known history of lung cancer with brain mets s/p resection, chemoradiation and recent scans showing extensive hemorrhagic mets and vasogenic edema. New CT head on admission showed superimposed small traumatic subdural hemorrhage. Neurosurgery was consulted and patient was started on Decadron, currently being tapered down. She was transitioned to 4 mg daily which I would favor continue until seen by her neurosurgeon or oncologist. Continue Keppra for seizure prophylaxis. ?-Palliative care consultation was obtained.  Patient declined hospice care. ?-Family unable will take care at home.  Medicaid filed.  Disposition pending ?  ?History of DVT/PE on chronic anticoagulation  -PE was diagnosed on 05/08/2021. Despite hemorrhagic metastasis in brain, patient elects to continue Eliquis.  She understands the high risk of intracranial bleeding.   ?  ?Type II diabetes mellitus ?Hypoglycemia -A1c 6.5 in February 2023. Patient has good appetite and wants to continue regular diet.  Blood sugar level fluctuating. Continue glargine 15 u QHS and Aspart 5U TID + SSI ? ?CBG (last 3)  ?Recent Labs  ?  08/08/21 ?1641 08/08/21 ?2056 08/09/21 ?0805  ?GLUCAP 370* 77 74  ? ? ? ?Scheduled Meds: ? amitriptyline  50 mg Oral QHS  ? apixaban  5 mg Oral BID  ? atorvastatin  40 mg Oral QPM  ? dexamethasone  4 mg Oral Daily  ? famotidine  20 mg Oral Daily  ? insulin aspart  0-15 Units Subcutaneous TID WC  ? insulin aspart  5 Units Subcutaneous TID WC  ? insulin glargine-yfgn  15 Units Subcutaneous QHS  ? levETIRAcetam  500  mg Oral BID  ? sodium chloride flush  3 mL Intravenous Q12H  ? vitamin B-12  1,000 mcg Oral Daily  ? ?Continuous Infusions: ?PRN Meds:.acetaminophen **OR** acetaminophen, albuterol, docusate sodium, naphazoline-glycerin, polyethylene glycol ? ?Diet Orders (From admission, onward)  ? ?  Start     Ordered  ? 07/09/21 0000  Diet - low sodium heart healthy       ? 07/09/21 1517  ? 07/07/21  1627  Diet regular Room service appropriate? Yes; Fluid consistency: Thin  Diet effective now       ?Question Answer Comment  ?Room service appropriate? Yes   ?Fluid consistency: Thin   ?  ? 07/07/21 1627  ? ?  ?  ? ?  ? ? ?DVT prophylaxis:  ?apixaban (ELIQUIS) tablet 5 mg  ? ?Lab Results  ?Component Value Date  ? PLT 210 07/28/2021  ? ? ?  Code Status: DNR ? ?Family Communication: no family at bedside  ? ?Status is: Inpatient ? ?Remains inpatient appropriate because: placement issues ? ?Level of care: Med-Surg ? ?Consultants:  ?None currently  ? ?Objective: ?Vitals:  ? 08/08/21 1557 08/08/21 1938 08/09/21 0507 08/09/21 7902  ?BP:  (!) 108/55 112/70 114/63  ?Pulse: (!) 102 (!) 109 89 91  ?Resp: 18 19 15 16   ?Temp: 98.6 ?F (37 ?C) (!) 97.3 ?F (36.3 ?C) 97.6 ?F (36.4 ?C) 98.1 ?F (36.7 ?C)  ?TempSrc: Oral Oral Oral Oral  ?SpO2: 97% 99% 97% 98%  ?Weight:      ?Height:      ? ? ?Intake/Output Summary (Last 24 hours) at 08/09/2021 0945 ?Last data filed at 08/09/2021 0510 ?Gross per 24 hour  ?Intake 900 ml  ?Output 1700 ml  ?Net -800 ml  ? ? ?Wt Readings from Last 3 Encounters:  ?07/08/21 56.4 kg  ?06/11/21 57.6 kg  ?06/10/21 58 kg  ? ? ?Examination: ? ?Constitutional: No distress ?Respiratory: Lungs are clear without wheezing ?Cardiovascular: Regular rate and rhythm ? ?Data Reviewed: I have independently reviewed following labs and imaging studies  ? ?CBC ?No results for input(s): WBC, HGB, HCT, PLT, MCV, MCH, MCHC, RDW, LYMPHSABS, MONOABS, EOSABS, BASOSABS, BANDABS in the last 168 hours. ? ?Invalid input(s): NEUTRABS, BANDSABD ? ?Recent Labs  ?Lab 08/08/21 ?0050  ?NA 141  ?K 3.9  ?CL 110  ?CO2 25  ?GLUCOSE 145*  ?BUN 22  ?CREATININE 0.59  ?CALCIUM 8.8*  ? ? ? ?------------------------------------------------------------------------------------------------------------------ ?No results for input(s): CHOL, HDL, LDLCALC, TRIG, CHOLHDL, LDLDIRECT in the last 72 hours. ? ?Lab Results  ?Component Value Date  ? HGBA1C 6.5  05/15/2021  ? ?------------------------------------------------------------------------------------------------------------------ ?No results for input(s): TSH, T4TOTAL, T3FREE, THYROIDAB in the last 72 hours. ? ?Invalid input(s): FREET3 ? ?Cardiac Enzymes ?No results for input(s): CKMB, TROPONINI, MYOGLOBIN in the last 168 hours. ? ?Invalid input(s): CK ?------------------------------------------------------------------------------------------------------------------ ?No results found for: BNP ? ?CBG: ?Recent Labs  ?Lab 08/08/21 ?4097 08/08/21 ?1134 08/08/21 ?1641 08/08/21 ?2056 08/09/21 ?0805  ?GLUCAP 96 213* 370* 77 74  ? ? ? ?No results found for this or any previous visit (from the past 240 hour(s)).  ? ?Radiology Studies: ?No results found. ? ? ?Marzetta Board, MD, PhD ?[facility] ? ?Between 7 am - 7 pm I am available, please contact me via Amion (for emergencies) or Securechat (non urgent messages) ? ?Between 7 pm - 7 am I am not available, please contact night coverage MD/APP via Amion ? ?

## 2021-08-10 DIAGNOSIS — R531 Weakness: Secondary | ICD-10-CM | POA: Diagnosis not present

## 2021-08-10 LAB — GLUCOSE, CAPILLARY
Glucose-Capillary: 121 mg/dL — ABNORMAL HIGH (ref 70–99)
Glucose-Capillary: 114 mg/dL — ABNORMAL HIGH (ref 70–99)
Glucose-Capillary: 353 mg/dL — ABNORMAL HIGH (ref 70–99)
Glucose-Capillary: 382 mg/dL — ABNORMAL HIGH (ref 70–99)
Glucose-Capillary: 150 mg/dL — ABNORMAL HIGH (ref 70–99)

## 2021-08-10 NOTE — Progress Notes (Signed)
?   08/10/21 1550  ?Assess: MEWS Score  ?Temp 99.3 ?F (37.4 ?C)  ?BP (!) 100/56  ?Pulse Rate (!) 110  ?Resp 18  ?SpO2 97 %  ?O2 Device Room Air  ?Assess: MEWS Score  ?MEWS Temp 0  ?MEWS Systolic 1  ?MEWS Pulse 1  ?MEWS RR 0  ?MEWS LOC 0  ?MEWS Score 2  ?MEWS Score Color Yellow  ?Treat  ?Pain Scale 0-10  ?Pain Score 0  ?Take Vital Signs  ?Increase Vital Sign Frequency  Yellow: Q 2hr X 2 then Q 4hr X 2, if remains yellow, continue Q 4hrs  ?Escalate  ?MEWS: Escalate Yellow: discuss with charge nurse/RN and consider discussing with provider and RRT  ?Notify: Charge Nurse/RN  ?Name of Charge Nurse/RN Notified Marella Bile RN  ?Date Charge Nurse/RN Notified 08/10/21  ?Time Charge Nurse/RN Notified 1557  ?Notify: Provider  ?Provider Name/Title Dr.Gherghe  ?Date Provider Notified 08/10/21  ?Time Provider Notified 1553  ?Provider response No new orders  ?Date of Provider Response 08/10/21  ?Time of Provider Response 1554  ?Document  ?Progress note created (see row info) Yes  ? ? ?

## 2021-08-10 NOTE — Progress Notes (Signed)
Pt needed 3 staff member and stedy to get back to bed. Pt was unable to stand and started buckling once on the stedy. ?

## 2021-08-10 NOTE — Progress Notes (Addendum)
?PROGRESS NOTE ? ?Katelyn Kennedy VVO:[id number] DOB: 16-Mar-1946 DOA: 07/07/2021 ?PCP: [name], MD ? ? LOS: 31 days  ? ?Brief Narrative / Interim history: ?76 y.o. female with PMH significant for DM2, HTN, PVD, lung cancer with brain mets s/p resection and chemoradiation, DVT/PE on Eliquis. Patient was brought to the ED from home on 3/27 from home after she slid out of bed and fell to the floor. She was being taken care at home by family members over the years of 47 and their own physical limitations.  Family members were not able to pick her up when she fell and hence EMS was called and she was brought to the ED. In the ED, CT scan of head (compared to CT scan and MRIs from last 2 months) showed extensive dural based hemorrhagic metastatic disease without significant change in extensive vasogenic edema or midline shift small volume extra-axial hemorrhage along the right frontal convexity with superimposed traumatic hemorrhage. Patient was admitted to hospital service. Her hospitalization was prolonged because patient was in a phase of denial of the severity of her condition.  At one point patient tried to leave AMA but she did not have enough family support at home. Family members unable to take her home and provide any support. Pending placement plan ? ?Subjective / 24h Interval events: ?She tells me today that she wants to go home and family told her that they can care for her.  No chest pain, no shortness of breath. ? ?Assesement and Plan: ?Principal Problem: ?  Fall at home secondary to weakness ?Active Problems: ?  Diet-controlled type 2 diabetes mellitus ([place]) ?  Vasogenic brain edema ([place]) ?  Chronic anticoagulation ?  Non-small cell lung cancer metastatic to brain [facility]) with vasogenic brain edema ?  Normocytic anemia ?  Leukocytosis ?  History of DVT (deep vein thrombosis) ?  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 ?Principal problem ?Small traumatic subdural  hemorrhage ?Non-small cell lung cancer ?Extensive hemorrhagic brain metastasis extensive vasogenic edema ?-Patient presented after a fall at home. She has a known history of lung cancer with brain mets s/p resection, chemoradiation and recent scans showing extensive hemorrhagic mets and vasogenic edema. New CT head on admission showed superimposed small traumatic subdural hemorrhage. Neurosurgery was consulted and patient was started on Decadron, currently being tapered down. She was transitioned to 4 mg daily which I would favor continue until seen by her neurosurgeon or oncologist. Continue Keppra for seizure prophylaxis. ?-Palliative care consultation was obtained.  Patient declined hospice care. ?-Family unable will take care at home.  Medicaid filed.  Disposition pending.  She tells me today that she wants to be discharged, family will take care of her.  I have called [facility], at the patient's request, but [name] tells me that that is not true and they are unable to care for her.  Continue to await placement ?  ?History of DVT/PE on chronic anticoagulation  -PE was diagnosed on 05/08/2021. Despite hemorrhagic metastasis in brain, patient elects to continue Eliquis.  She understands the high risk of intracranial bleeding.   ?  ?Type II diabetes mellitus ?Hypoglycemia -A1c 6.5 in February 2023. Patient has good appetite and wants to continue regular diet.  Blood sugar level fluctuating. Continue glargine 15 u QHS and Aspart 5U TID + SSI ? ?CBG (last 3)  ?Recent Labs  ?  08/09/21 ?1823 08/09/21 ?2055 08/10/21 ?0737  ?GLUCAP 356* 268* 121*  ? ? ? ?Scheduled Meds: ?  amitriptyline  50 mg Oral QHS  ? apixaban  5 mg Oral BID  ? atorvastatin  40 mg Oral QPM  ? dexamethasone  4 mg Oral Daily  ? famotidine  20 mg Oral Daily  ? insulin aspart  0-15 Units Subcutaneous TID WC  ? insulin aspart  5 Units Subcutaneous TID WC  ? insulin glargine-yfgn  15 Units Subcutaneous QHS  ? levETIRAcetam  500 mg Oral BID  ? sodium chloride  flush  3 mL Intravenous Q12H  ? vitamin B-12  1,000 mcg Oral Daily  ? ?Continuous Infusions: ?PRN Meds:.acetaminophen **OR** acetaminophen, albuterol, docusate sodium, naphazoline-glycerin, polyethylene glycol ? ?Diet Orders (From admission, onward)  ? ?  Start     Ordered  ? 07/09/21 0000  Diet - low sodium heart healthy       ? 07/09/21 1517  ? 07/07/21 1627  Diet regular Room service appropriate? Yes; Fluid consistency: Thin  Diet effective now       ?Question Answer Comment  ?Room service appropriate? Yes   ?Fluid consistency: Thin   ?  ? 07/07/21 1627  ? ?  ?  ? ?  ? ? ?DVT prophylaxis:  ?apixaban (ELIQUIS) tablet 5 mg  ? ?Lab Results  ?Component Value Date  ? PLT 210 07/28/2021  ? ? ?  Code Status: DNR ? ?Family Communication: no family at bedside  ? ?Status is: Inpatient ? ?Remains inpatient appropriate because: placement issues ? ?Level of care: Med-Surg ? ?Consultants:  ?None currently  ? ?Objective: ?Vitals:  ? 08/09/21 2106 08/09/21 2106 08/10/21 0617 08/10/21 0746  ?BP: (!) 112/51 (!) 112/51 108/77 111/60  ?Pulse: (!) 110 (!) 110 (!) 103 (!) 103  ?Resp: 19 19 18 18   ?Temp: 98.1 ?F (36.7 ?C) 98.1 ?F (36.7 ?C) 98.1 ?F (36.7 ?C) 99 ?F (37.2 ?C)  ?TempSrc:   Oral Oral  ?SpO2: 98% 98% 99% 100%  ?Weight:      ?Height:      ? ? ?Intake/Output Summary (Last 24 hours) at 08/10/2021 1122 ?Last data filed at 08/10/2021 2130 ?Gross per 24 hour  ?Intake 480 ml  ?Output 900 ml  ?Net -420 ml  ? ? ?Wt Readings from Last 3 Encounters:  ?07/08/21 56.4 kg  ?06/11/21 57.6 kg  ?06/10/21 58 kg  ? ? ?Examination: ? ?Constitutional: NAD ?Respiratory: CTA ?Cardiovascular: RRR ? ?Data Reviewed: I have independently reviewed following labs and imaging studies  ? ?CBC ?No results for input(s): WBC, HGB, HCT, PLT, MCV, MCH, MCHC, RDW, LYMPHSABS, MONOABS, EOSABS, BASOSABS, BANDABS in the last 168 hours. ? ?Invalid input(s): NEUTRABS, BANDSABD ? ?Recent Labs  ?Lab 08/08/21 ?0050  ?NA 141  ?K 3.9  ?CL 110  ?CO2 25  ?GLUCOSE 145*  ?BUN 22   ?CREATININE 0.59  ?CALCIUM 8.8*  ? ? ? ?------------------------------------------------------------------------------------------------------------------ ?No results for input(s): CHOL, HDL, LDLCALC, TRIG, CHOLHDL, LDLDIRECT in the last 72 hours. ? ?Lab Results  ?Component Value Date  ? HGBA1C 6.5 05/15/2021  ? ?------------------------------------------------------------------------------------------------------------------ ?No results for input(s): TSH, T4TOTAL, T3FREE, THYROIDAB in the last 72 hours. ? ?Invalid input(s): FREET3 ? ?Cardiac Enzymes ?No results for input(s): CKMB, TROPONINI, MYOGLOBIN in the last 168 hours. ? ?Invalid input(s): CK ?------------------------------------------------------------------------------------------------------------------ ?No results found for: BNP ? ?CBG: ?Recent Labs  ?Lab 08/09/21 ?0805 08/09/21 ?1124 08/09/21 ?1823 08/09/21 ?2055 08/10/21 ?0737  ?GLUCAP 74 107* 356* 268* 121*  ? ? ? ?No results found for this or any previous visit (from the past 240 hour(s)).  ? ?Radiology Studies: ?No  results found. ? ? ?Marzetta Board, MD, PhD ?[facility] ? ?Between 7 am - 7 pm I am available, please contact me via Amion (for emergencies) or Securechat (non urgent messages) ? ?Between 7 pm - 7 am I am not available, please contact night coverage MD/APP via Amion ? ?

## 2021-08-10 NOTE — Progress Notes (Signed)
Mobility Specialist Progress Note  ? ? 08/10/21 1226  ?Mobility  ?Activity Transferred from bed to chair  ?Level of Assistance Maximum assist, patient does 25-49%  ?Assistive Device Front wheel walker  ?Activity Response Tolerated fair  ?$Mobility charge 1 Mobility  ? ?Pt received in bed and agreeable. No complaints. Left with call bell in reach.  ? ?Hildred Alamin ?Mobility Specialist  ?Primary: 5N M.S. Phone: [phone] ?Secondary: 6[address] M.S. Phone: [phone] ?  ?

## 2021-08-10 NOTE — Plan of Care (Signed)
?  Problem: Education: ?Goal: Knowledge of General Education information will improve ?Description: Including pain rating scale, medication(s)/side effects and non-pharmacologic comfort measures ?Outcome: Not Progressing ?  ?Problem: Health Behavior/Discharge Planning: ?Goal: Ability to manage health-related needs will improve ?Outcome: Not Progressing ?  ?Problem: Clinical Measurements: ?Goal: Will remain free from infection ?Outcome: Progressing ?Goal: Diagnostic test results will improve ?Outcome: Progressing ?Goal: Respiratory complications will improve ?Outcome: Progressing ?  ?

## 2021-08-11 DIAGNOSIS — R531 Weakness: Secondary | ICD-10-CM | POA: Diagnosis not present

## 2021-08-11 LAB — GLUCOSE, CAPILLARY
Glucose-Capillary: 87 mg/dL (ref 70–99)
Glucose-Capillary: 197 mg/dL — ABNORMAL HIGH (ref 70–99)
Glucose-Capillary: 127 mg/dL — ABNORMAL HIGH (ref 70–99)
Glucose-Capillary: 246 mg/dL — ABNORMAL HIGH (ref 70–99)

## 2021-08-11 NOTE — TOC Progression Note (Addendum)
Transition of Care (TOC) - Progression Note  ? ? ?Patient Details  ?Name: Katelyn Kennedy ?MRN: [id number] ?Date of Birth: 29-Dec-1945 ? ?Transition of Care (TOC) CM/SW Contact  ?Curlene Labrum, RN ?Phone Number: ?08/11/2021, 8:34 AM ? ?Clinical Narrative:    ?CM called and left a message with Vara Guardian, CM at [facility] requesting review of clinicals for LTC placement for the patient.  Clinicals were uploaded in numerous facilities in the area as well  considering patient has current pending Medicaid number. ? ?I called and left a message with the patient's daughter, Aundraya Dripps to update her regarding continual search for available Medicaid bed for the patient for placement.  I called and spoke with the patient's sister-in-law, Wenda on the phone and explained that DTP Team is continuing to explore options for LTC placement with no available beds at this time.  The patient has $21,000 overage in assets and the patient/family would need to spend this amount down before Medicaid would be approved per financial counseling/DSS note. ? ?I called and spoke with Cyndia Skeeters, admissions director with [facility] and the facility has Medicaid beds and would review the patient for possible bed offer at this time. ? ?Message left with Audelia Acton, Admissions director with [facility]. ? ?CM and MSW with DTP Team will continue to follow the patient for discharge planning needs for LTC placement in SNF facility for care.  The patient does not have family availability for care. ? ? ?Expected Discharge Plan: [place] ?Barriers to Discharge: Continued Medical Work up, Ship broker, Family Issues ? ?Expected Discharge Plan and Services ?Expected Discharge Plan: [place] ?  ?  ?  ?Living arrangements for the past 2 months: [place] ?Expected Discharge Date: 07/09/21               ?  ?  ?  ?  ?  ?  ?  ?  ?  ?  ? ? ?Social Determinants of Health (SDOH) Interventions ?   ? ?Readmission Risk Interventions ? ?  07/18/2021  ? 12:39 PM 03/19/2021  ?  2:43 PM 11/08/2020  ?  3:25 PM  ?Readmission Risk Prevention Plan  ?Post Dischage Appt   Complete  ?Medication Screening   Complete  ?Transportation Screening Complete Complete Complete  ?PCP or Specialist Appt within 3-5 Days  Complete   ?[place] or Home Care Consult  Complete   ?Social Work Consult for [place] Planning/Counseling  Complete   ?Palliative Care Screening  Not Applicable   ?Medication Review Press photographer) Complete Complete   ?PCP or Specialist appointment within 3-5 days of discharge Complete    ?[place] or Home Care Consult Complete    ?SW Recovery Care/Counseling Consult Complete    ?Palliative Care Screening Complete    ?Skilled Nursing Facility Complete    ? ? ?

## 2021-08-11 NOTE — Progress Notes (Signed)
Mobility Specialist Progress Note: ? ? 08/11/21 1125  ?Mobility  ?Activity Stood at bedside;Dangled on edge of bed  ?Level of Assistance Maximum assist, patient does 25-49%  ?Assistive Device Stedy  ?Activity Response Tolerated poorly  ?$Mobility charge 1 Mobility  ? ?Pt received in bed willing to participate in mobility. MaxA to stand in stedy but pt had BM and needed to be cleaned up. Pt not able to stay standing so sat back on bed.  MaxAx2 to stand but pt not able to completely rise needing to lay back down. Pt cleaned up and left in bed with call bell in reach and all needs met.  ? ?Katelyn Kennedy ?Mobility Specialist ?Primary Phone [phone] ? ?

## 2021-08-11 NOTE — Progress Notes (Signed)
?PROGRESS NOTE ? ?Katelyn Kennedy  OHY:[id number] DOB: 22-Feb-1946 DOA: 07/07/2021 ?PCP: [name], MD  ? ?Brief Narrative: ? ?76 y.o. female with PMH significant for DM2, HTN, PVD, lung cancer with brain mets s/p resection and chemoradiation, DVT/PE on Eliquis. Patient was brought to the ED from home on 3/27 from home after she slid out of bed and fell to the floor. She was being taken care at home by family members over the years of 28 and their own physical limitations.  Family members were not able to pick her up when she fell and hence EMS was called and she was brought to the ED. In the ED, CT scan of head (compared to CT scan and MRIs from last 2 months) showed extensive dural based hemorrhagic metastatic disease without significant change in extensive vasogenic edema or midline shift small volume extra-axial hemorrhage along the right frontal convexity with superimposed traumatic hemorrhage. Patient was admitted to hospital service. Her hospitalization was prolonged because patient was in a phase of denial of the severity of her condition.  At one point patient tried to leave AMA but she did not have enough family support at home. Family members unable to take her home and provide any support. Pending placement plan.TOC following ? ?Assessment & Plan: ? ?Principal Problem: ?  Fall at home secondary to weakness ?Active Problems: ?  Diet-controlled type 2 diabetes mellitus ([place]) ?  Vasogenic brain edema ([place]) ?  Chronic anticoagulation ?  Non-small cell lung cancer metastatic to brain [facility]) with vasogenic brain edema ?  Normocytic anemia ?  Leukocytosis ?  History of DVT (deep vein thrombosis) ?  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 ?Small traumatic subdural hemorrhage ?Non-small cell lung cancer ?Extensive hemorrhagic brain metastasis extensive vasogenic edema ?-Patient presented after a fall at home. She has a known history of lung cancer with brain mets s/p resection,  chemoradiation and recent scans showing extensive hemorrhagic mets and vasogenic edema. New CT head on admission showed superimposed small traumatic subdural hemorrhage. Neurosurgery was consulted and patient was started on Decadron, currently being tapered down. She was transitioned to 4 mg daily which we will continue until seen by her neurosurgeon or oncologist. Continue Keppra for seizure prophylaxis. ?-Palliative care consultation was obtained.  Patient declined hospice care. ?-Family unable will take care at home.  Medicaid filed.  Disposition pending.   Continue to await placement ?  ?History of DVT/PE on chronic anticoagulation  -PE was diagnosed on 05/08/2021. Despite hemorrhagic metastasis in brain, patient elects to continue Eliquis.  She understands the high risk of intracranial bleeding.   ?  ?Type II diabetes mellitus ?Hypoglycemia -A1c 6.5 in February 2023. Patient has good appetite and wants to continue regular diet.  Blood sugar level fluctuating. Continue glargine 15 u QHS and Aspart 5U TID + SSI. ?Monitor blood sugars ?  ?  ?  ? ?DVT prophylaxis: ?apixaban (ELIQUIS) tablet 5 mg  ? ?  Code Status: DNR ? ?Family Communication: None at bedside ? ?Patient status: Inpatient ? ?Patient is from : Home ? ?Anticipated discharge to: Skilled nursing facility ? ?Estimated DC date: As soon as bed is available at a skilled nursing facility ? ? ?Consultants: Neurosurgery ? ?Procedures: None ? ?Antimicrobials:  ?Anti-infectives (From admission, onward)  ? ? None  ? ?  ? ? ?Subjective: ?Patient seen and examined at the bedside this morning.  Hemodynamically stable.  Lying in bed without any complaints.  Alert and oriented ? ?  Objective: ?Vitals:  ? 08/10/21 1950 08/11/21 0101 08/11/21 0459 08/11/21 0739  ?BP: (!) 100/56 112/63 (!) 95/57 (!) 100/50  ?Pulse: (!) 106 97 98 98  ?Resp: 18 18 16 16   ?Temp: 97.9 ?F (36.6 ?C) 98.3 ?F (36.8 ?C) 97.7 ?F (36.5 ?C) 98.2 ?F (36.8 ?C)  ?TempSrc: Oral Oral Oral Oral  ?SpO2: 97%  99% 94% 96%  ?Weight:      ?Height:      ? ? ?Intake/Output Summary (Last 24 hours) at 08/11/2021 1124 ?Last data filed at 08/10/2021 1528 ?Gross per 24 hour  ?Intake --  ?Output 300 ml  ?Net -300 ml  ? ?Filed Weights  ? 07/07/21 0238 07/08/21 0326  ?Weight: (P) 57.6 kg 56.4 kg  ? ? ?Examination: ? ?General exam: Overall comfortable, not in distress, pleasant elderly female ?HEENT: PERRL ?Respiratory system:  no wheezes or crackles  ?Cardiovascular system: S1 & S2 heard, RRR.  ?Gastrointestinal system: Abdomen is nondistended, soft and nontender. ?Central nervous system: Alert and oriented ?Extremities: No edema, no clubbing ,no cyanosis ?Skin: No rashes, no ulcers,no icterus   ? ? ?Data Reviewed: I have personally reviewed following labs and imaging studies ? ?CBC: ?No results for input(s): WBC, NEUTROABS, HGB, HCT, MCV, PLT in the last 168 hours. ?Basic Metabolic Panel: ?Recent Labs  ?Lab 08/08/21 ?0050  ?NA 141  ?K 3.9  ?CL 110  ?CO2 25  ?GLUCOSE 145*  ?BUN 22  ?CREATININE 0.59  ?CALCIUM 8.8*  ? ? ? ?No results found for this or any previous visit (from the past 240 hour(s)).  ? ?Radiology Studies: ?No results found. ? ?Scheduled Meds: ? amitriptyline  50 mg Oral QHS  ? apixaban  5 mg Oral BID  ? atorvastatin  40 mg Oral QPM  ? dexamethasone  4 mg Oral Daily  ? famotidine  20 mg Oral Daily  ? insulin aspart  0-15 Units Subcutaneous TID WC  ? insulin aspart  5 Units Subcutaneous TID WC  ? insulin glargine-yfgn  15 Units Subcutaneous QHS  ? levETIRAcetam  500 mg Oral BID  ? vitamin B-12  1,000 mcg Oral Daily  ? ?Continuous Infusions: ? ? LOS: 32 days  ? ?Shelly Coss, MD ?[facility] ?P5/04/2021, 11:24 AM   ?

## 2021-08-11 NOTE — Progress Notes (Signed)
Inpatient Diabetes Program Recommendations ? ?AACE/ADA: New Consensus Statement on Inpatient Glycemic Control (2015) ? ?Target Ranges:  Prepandial:   less than 140 mg/dL ?     Peak postprandial:   less than 180 mg/dL (1-2 hours) ?     Critically ill patients:  140 - 180 mg/dL  ? ?Lab Results  ?Component Value Date  ? GLUCAP 197 (H) 08/11/2021  ? HGBA1C 6.5 05/15/2021  ? ? Latest Reference Range & Units 08/10/21 16:41 08/10/21 17:39 08/10/21 22:02 08/11/21 08:07 08/11/21 11:21  ?Glucose-Capillary 70 - 99 mg/dL 353 (H) 382 (H) 150 (H) 87 197 (H)  ?(H): Data is abnormally high ?Review of Glycemic Control ? ?Diabetes history: type 2 ?Outpatient Diabetes medications: Lantus 10 units daily ?Current orders for Inpatient glycemic control: Semglee 15 units daily, Novolog 0-15 units TID & HS scale ? ?Inpatient Diabetes Program Recommendations:   ?Noted that patient's blood sugars have been elevated in the late afternoon and evening due to the steroids.  ? ?Recommend increasing Novolog to 6 units TID if blood sugars continue to be greater than 200 mg/dl and if patient eats at least 50% of meal. ? ?Harvel Ricks RN BSN CDE ?Diabetes Coordinator ?Pager: [phone]  8am-5pm  ? ? ? ?

## 2021-08-11 NOTE — Plan of Care (Signed)
?  Problem: Education: ?Goal: Knowledge of General Education information will improve ?Description: Including pain rating scale, medication(s)/side effects and non-pharmacologic comfort measures ?08/11/2021 0058 by Keturah Shavers, RN ?Outcome: Not Progressing ?08/10/2021 2046 by Keturah Shavers, RN ?Outcome: Not Progressing ?  ?Problem: Health Behavior/Discharge Planning: ?Goal: Ability to manage health-related needs will improve ?08/11/2021 0058 by Keturah Shavers, RN ?Outcome: Not Progressing ?08/10/2021 2046 by Keturah Shavers, RN ?Outcome: Not Progressing ?  ?Problem: Clinical Measurements: ?Goal: Will remain free from infection ?08/11/2021 0058 by Keturah Shavers, RN ?Outcome: Progressing ?08/10/2021 2046 by Keturah Shavers, RN ?Outcome: Progressing ?Goal: Diagnostic test results will improve ?08/11/2021 0058 by Keturah Shavers, RN ?Outcome: Not Progressing ?08/10/2021 2046 by Keturah Shavers, RN ?Outcome: Progressing ?Goal: Respiratory complications will improve ?Outcome: Progressing ?  ?Problem: Nutrition: ?Goal: Adequate nutrition will be maintained ?Outcome: Progressing ?  ?

## 2021-08-12 ENCOUNTER — Ambulatory Visit: Payer: PPO | Admitting: Family Medicine

## 2021-08-12 DIAGNOSIS — R531 Weakness: Secondary | ICD-10-CM | POA: Diagnosis not present

## 2021-08-12 LAB — GLUCOSE, CAPILLARY
Glucose-Capillary: 138 mg/dL — ABNORMAL HIGH (ref 70–99)
Glucose-Capillary: 190 mg/dL — ABNORMAL HIGH (ref 70–99)
Glucose-Capillary: 262 mg/dL — ABNORMAL HIGH (ref 70–99)
Glucose-Capillary: 180 mg/dL — ABNORMAL HIGH (ref 70–99)

## 2021-08-12 NOTE — Progress Notes (Signed)
Physical Therapy Treatment ?Patient Details ?Name: Katelyn Kennedy ?MRN: [id number] ?DOB: 12-26-1945 ?Today's Date: 08/12/2021 ? ? ?History of Present Illness Pt is a 76 y.o. female admitted 07/07/21 after fall out of bed with reports of worsening L-side weakness related to brain metastases with prior resection. Head CT showing continued presence of brain mets with vasogenic edema and leftward shift of the brain. 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 ?  ?PT Comments  ? ? Pt progressing with mobility. Today's session focused on transfer training and standing tolerance with and without stedy frame, pt requiring mod-maxA to stand; pt quick to fatigue, unable to progress to pre-gait activity this session. Pt remains limited by generalized weakness, decreased activity tolerance, poor balance strategies/postural reactions and impaired cognition. Continue to recommend SNF-level therapies to maximize functional mobility and independence prior to return home. ?   ?Recommendations for follow up therapy are one component of a multi-disciplinary discharge planning process, led by the attending physician.  Recommendations may be updated based on patient status, additional functional criteria and insurance authorization. ? ?Follow Up Recommendations ? Skilled nursing-short term rehab (<3 hours/day) ?  ?  ?Assistance Recommended at Discharge Frequent or constant Supervision/Assistance  ?Patient can return home with the following A lot of help with bathing/dressing/bathroom;Assistance with cooking/housework;Direct supervision/assist for financial management;Direct supervision/assist for medications management;Assist for transportation;Help with stairs or ramp for entrance;Two people to help with walking and/or transfers ?  ?Equipment Recommendations ? Wheelchair (measurements PT);Wheelchair cushion (measurements PT);Hospital bed;Other (comment)  (lift equipment)  ?  ?Recommendations for Other Services   ? ? ?  ?Precautions / Restrictions Precautions ?Precautions: Fall;Other (comment) ?Precaution Comments: L side weakness and inattention; bladder/bowel incontinence  ?  ? ?Mobility ? Bed Mobility ?Overal bed mobility: Needs Assistance ?Bed Mobility: Supine to Sit ?  ?  ?Supine to sit: Max assist, +2 for safety/equipment (HOB near flat) ?  ?  ?General bed mobility comments: cues to use bed rail, modA for BLE management, maxA for trunk elevation and scooting hips to EOB ?  ? ?Transfers ?Overall transfer level: Needs assistance ?Equipment used: Ambulation equipment used ?Transfers: Sit to/from Stand, Bed to chair/wheelchair/BSC ?Sit to Stand: Max assist, +2 safety/equipment, Mod assist, Min assist ?  ?  ?  ?  ?  ?General transfer comment: initial heavy maxA to stand from elevated EOB into stedy frame, multiple sit<>stands from stedy seat with min guard to minA, pt needing repeated cues for sequencing and safety; additional standing trial from recliner with stedy turned around to allow pt to pull up on arm rest, modA for trunk elevation without knees blocked, pt requiring maxA for eccentric lowering back to sit; futher trials limited by fatigue ?Transfer via Lift Equipment: Stedy ? ?Ambulation/Gait ?  ?  ?  ?  ?  ?  ?  ?  ? ? ?Stairs ?  ?  ?  ?  ?  ? ? ?Wheelchair Mobility ?  ? ?Modified Rankin (Stroke Patients Only) ?  ? ? ?  ?Balance Overall balance assessment: Needs assistance ?Sitting-balance support: No upper extremity supported ?Sitting balance-Leahy Scale: Poor ?Sitting balance - Comments: reliant on UE support and min guard to modA for static sitting balance ?  ?Standing balance support: Bilateral upper extremity supported, Reliant on assistive device for balance ?Standing balance-Leahy Scale: Poor ?Standing balance comment: reliant on BUE support ?  ?  ?  ?  ?  ?  ?  ?  ?  ?  ?  ?  ? ?  ?  Cognition Arousal/Alertness: Awake/alert ?Behavior During Therapy:  Flat affect ?Overall Cognitive Status: No family/caregiver present to determine baseline cognitive functioning ?Area of Impairment: Orientation, Attention, Memory, Following commands, Safety/judgement, Awareness, Problem solving ?  ?  ?  ?  ?  ?  ?  ?  ?Orientation Level: Disoriented to, Time, Situation ?Current Attention Level: Sustained ?Memory: Decreased short-term memory ?Following Commands: Follows one step commands with increased time ?Safety/Judgement: Decreased awareness of safety, Decreased awareness of deficits ?Awareness: Intellectual, Emergent ?Problem Solving: Slow processing, Requires verbal cues, Decreased initiation ?General Comments: pt focused on not having anything to eat "they haven't even given me breakfast yet" although pt had eaten breakfast and lunch already ?  ?  ? ?  ?Exercises Other Exercises ?Other Exercises: 5x bouts of static standing in stedy frame (~15-20-sec intervals), pt initially able to maintain without knee block, starting to need L knee blocked by frame with fatigue ? ?  ?General Comments General comments (skin integrity, edema, etc.): pt unaware of bowel incontinence, dependent for pericare rolling bed level ?  ?  ? ?Pertinent Vitals/Pain Pain Assessment ?Pain Assessment: No/denies pain ?Pain Intervention(s): Monitored during session  ? ? ?Home Living   ?  ?  ?  ?  ?  ?  ?  ?  ?  ?   ?  ?Prior Function    ?  ?  ?   ? ?PT Goals (current goals can now be found in the care plan section) Acute Rehab PT Goals ?Patient Stated Goal: to go home ?PT Goal Formulation: With patient ?Time For Goal Achievement: 08/26/21 ?Potential to Achieve Goals: Fair ?Progress towards PT goals: Progressing toward goals (slowly) ? ?  ?Frequency ? ? ? Min 2X/week ? ? ? ?  ?PT Plan Current plan remains appropriate  ? ? ?Co-evaluation   ?  ?  ?  ?  ? ?  ?AM-PAC PT "6 Clicks" Mobility   ?Outcome Measure ? Help needed turning from your back to your side while in a flat bed without using bedrails?: A Lot ?Help  needed moving from lying on your back to sitting on the side of a flat bed without using bedrails?: A Lot ?Help needed moving to and from a bed to a chair (including a wheelchair)?: Total ?Help needed standing up from a chair using your arms (e.g., wheelchair or bedside chair)?: A Lot ?Help needed to walk in hospital room?: Total ?Help needed climbing 3-5 steps with a railing? : Total ?6 Click Score: 9 ? ?  ?End of Session Equipment Utilized During Treatment: Gait belt ?Activity Tolerance: Patient tolerated treatment well;Patient limited by fatigue ?Patient left: in chair;with call bell/phone within reach;with chair alarm set ?Nurse Communication: Mobility status;Need for lift equipment ?PT Visit Diagnosis: Unsteadiness on feet (R26.81);Muscle weakness (generalized) (M62.81);Repeated falls (R29.6);History of falling (Z91.81) ?  ? ? ?Time: 9169-4503 ?PT Time Calculation (min) (ACUTE ONLY): 24 min ? ?Charges:  $Therapeutic Exercise: 8-22 mins ?$Therapeutic Activity: 8-22 mins          ?          ? ?Mabeline Caras, PT, DPT ?Acute Rehabilitation Services  ?Pager [phone] ?Office [phone] ? ?Derry Lory ?08/12/2021, 5:42 PM ? ?

## 2021-08-12 NOTE — Progress Notes (Signed)
?PROGRESS NOTE ? ?Katelyn Kennedy  PXT:[id number] DOB: 28-Jun-1945 DOA: 07/07/2021 ?PCP: [name], MD  ? ?Brief Narrative: ? ?76 y.o. female with PMH significant for DM2, HTN, PVD, lung cancer with brain mets s/p resection and chemoradiation, DVT/PE on Eliquis. Patient was brought to the ED from home on 3/27 from home after she slid out of bed and fell to the floor. She was being taken care at home by family members over the years of 1 and their own physical limitations.  Family members were not able to pick her up when she fell and hence EMS was called and she was brought to the ED. In the ED, CT scan of head (compared to CT scan and MRIs from last 2 months) showed extensive dural based hemorrhagic metastatic disease without significant change in extensive vasogenic edema or midline shift small volume extra-axial hemorrhage along the right frontal convexity with superimposed traumatic hemorrhage. Patient was admitted to hospital service. Her hospitalization was prolonged because patient was in a phase of denial of the severity of her condition.  At one point patient tried to leave AMA but she did not have enough family support at home. Family members unable to take her home and provide any support. Pending placement plan.TOC following ? ?Assessment & Plan: ? ?Principal Problem: ?  Fall at home secondary to weakness ?Active Problems: ?  Diet-controlled type 2 diabetes mellitus ([place]) ?  Vasogenic brain edema ([place]) ?  Chronic anticoagulation ?  Non-small cell lung cancer metastatic to brain [facility]) with vasogenic brain edema ?  Normocytic anemia ?  Leukocytosis ?  History of DVT (deep vein thrombosis) ?  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 ?Small traumatic subdural hemorrhage ?Non-small cell lung cancer ?Extensive hemorrhagic brain metastasis extensive vasogenic edema ?-Patient presented after a fall at home. She has a known history of lung cancer with brain mets s/p resection,  chemoradiation and recent scans showing extensive hemorrhagic mets and vasogenic edema. New CT head on admission showed superimposed small traumatic subdural hemorrhage. Neurosurgery was consulted and patient was started on Decadron, currently being tapered down. She was transitioned to 4 mg daily which we will continue until seen by her neurosurgeon or oncologist. Continue Keppra for seizure prophylaxis. ?-Palliative care consultation was obtained.  Patient declined hospice care. ?-Family unable will take care at home.  Medicaid filed.  Disposition pending.   Continue to await placement ?  ?History of DVT/PE on chronic anticoagulation  -PE was diagnosed on 05/08/2021. Despite hemorrhagic metastasis in brain, patient elects to continue Eliquis.  She understands the high risk of intracranial bleeding.   ?  ?Type II diabetes mellitus ?Hypoglycemia -A1c 6.5 in February 2023. Patient has good appetite and wants to continue regular diet.  Blood sugar level fluctuating. Continue glargine 15 u QHS and Aspart 5U TID + SSI. ?Monitor blood sugars ?  ?  ?  ? ?DVT prophylaxis: ?apixaban (ELIQUIS) tablet 5 mg  ? ?  Code Status: DNR ? ?Family Communication: None at bedside ? ?Patient status: Inpatient ? ?Patient is from : Home ? ?Anticipated discharge to: Skilled nursing facility ? ?Estimated DC date: As soon as bed is available at a skilled nursing facility ? ? ?Consultants: Neurosurgery ? ?Procedures: None ? ?Antimicrobials:  ?Anti-infectives (From admission, onward)  ? ? None  ? ?  ? ? ?Subjective: ?Patient seen and examined at the bedside this morning.  Hemodynamically stable without any complaints, lying on bed like always. ? ?Objective: ?Vitals:  ?  08/11/21 9528 08/11/21 1605 08/11/21 2046 08/12/21 0521  ?BP: (!) 100/50 110/63 (!) 145/125 (!) 108/58  ?Pulse: 98 (!) 109 (!) 107 100  ?Resp: 16 16 16 17   ?Temp: 98.2 ?F (36.8 ?C) 98.2 ?F (36.8 ?C) 98.3 ?F (36.8 ?C) 98.5 ?F (36.9 ?C)  ?TempSrc: Oral Oral Oral Oral  ?SpO2: 96% 98%  97% 94%  ?Weight:      ?Height:      ? ? ?Intake/Output Summary (Last 24 hours) at 08/12/2021 1147 ?Last data filed at 08/12/2021 4132 ?Gross per 24 hour  ?Intake 360 ml  ?Output 1600 ml  ?Net -1240 ml  ? ?Filed Weights  ? 07/07/21 0238 07/08/21 0326  ?Weight: (P) 57.6 kg 56.4 kg  ? ? ?Examination: ? ?General exam: Overall comfortable,  pleasant elderly female, lying in bed without any distress ? ? ?Data Reviewed: I have personally reviewed following labs and imaging studies ? ?CBC: ?No results for input(s): WBC, NEUTROABS, HGB, HCT, MCV, PLT in the last 168 hours. ?Basic Metabolic Panel: ?Recent Labs  ?Lab 08/08/21 ?0050  ?NA 141  ?K 3.9  ?CL 110  ?CO2 25  ?GLUCOSE 145*  ?BUN 22  ?CREATININE 0.59  ?CALCIUM 8.8*  ? ? ? ?No results found for this or any previous visit (from the past 240 hour(s)).  ? ?Radiology Studies: ?No results found. ? ?Scheduled Meds: ? amitriptyline  50 mg Oral QHS  ? apixaban  5 mg Oral BID  ? atorvastatin  40 mg Oral QPM  ? dexamethasone  4 mg Oral Daily  ? famotidine  20 mg Oral Daily  ? insulin aspart  0-15 Units Subcutaneous TID WC  ? insulin aspart  5 Units Subcutaneous TID WC  ? insulin glargine-yfgn  15 Units Subcutaneous QHS  ? levETIRAcetam  500 mg Oral BID  ? vitamin B-12  1,000 mcg Oral Daily  ? ?Continuous Infusions: ? ? LOS: 33 days  ? ?Shelly Coss, MD ?[facility] ?P5/05/2021, 11:47 AM   ?

## 2021-08-12 NOTE — Progress Notes (Addendum)
1:20pm: ?CSW spoke with patient's daughter Katelyn Kennedy who states she wants to accept the bed offer from [facility]. ? ?CSW notified patient's sister in law of information. ? ?CSW notified Katelyn Kennedy at [facility] of information. ? ?8:30am: ?CSW received bed offers from [facility] and [facility]. CSW spoke with Katelyn Kennedy to discuss patient's financial situation. The private pay rate for both facilities is ~$8,850 a month.  ? ?CSW spoke with patient's sister in law Katelyn Kennedy to present offers - [name] states she is in agreement but wants patient's daughter Katelyn Kennedy to be involved with decision making. ? ?CSW attempted to reach patient's daughter Katelyn Kennedy without success - a voicemail was left requesting a return call. ? ?Madilyn Fireman, MSW, LCSW ?Transitions of Care  Clinical Social Worker II ?[phone] ? ?

## 2021-08-13 DIAGNOSIS — R531 Weakness: Secondary | ICD-10-CM | POA: Diagnosis not present

## 2021-08-13 LAB — GLUCOSE, CAPILLARY
Glucose-Capillary: 77 mg/dL (ref 70–99)
Glucose-Capillary: 160 mg/dL — ABNORMAL HIGH (ref 70–99)
Glucose-Capillary: 113 mg/dL — ABNORMAL HIGH (ref 70–99)
Glucose-Capillary: 280 mg/dL — ABNORMAL HIGH (ref 70–99)

## 2021-08-13 NOTE — Progress Notes (Signed)
?PROGRESS NOTE ? ?Katelyn Kennedy  TOI:[id number] DOB: Aug 11, 1945 DOA: 07/07/2021 ?PCP: [name], MD  ? ?Brief Narrative: ? ?76 y.o. female with PMH significant for DM2, HTN, PVD, lung cancer with brain mets s/p resection and chemoradiation, DVT/PE on Eliquis. Patient was brought to the ED from home on 3/27 from home after she slid out of bed and fell to the floor. She was being taken care at home by family members over the years of 51 and their own physical limitations.  Family members were not able to pick her up when she fell and hence EMS was called and she was brought to the ED. In the ED, CT scan of head (compared to CT scan and MRIs from last 2 months) showed extensive dural based hemorrhagic metastatic disease without significant change in extensive vasogenic edema or midline shift small volume extra-axial hemorrhage along the right frontal convexity with superimposed traumatic hemorrhage. Patient was admitted to hospital service. Her hospitalization was prolonged because patient was in a phase of denial of the severity of her condition.  At one point patient tried to leave AMA but she did not have enough family support at home. Family members unable to take her home and provide any support. Pending placement plan.TOC following.  Looks like she can be discharged to [facility]. ? ?Assessment & Plan: ? ?Principal Problem: ?  Fall at home secondary to weakness ?Active Problems: ?  Diet-controlled type 2 diabetes mellitus ([place]) ?  Vasogenic brain edema ([place]) ?  Chronic anticoagulation ?  Non-small cell lung cancer metastatic to brain [facility]) with vasogenic brain edema ?  Normocytic anemia ?  Leukocytosis ?  History of DVT (deep vein thrombosis) ?  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 ?Small traumatic subdural hemorrhage ?Non-small cell lung cancer ?Extensive hemorrhagic brain metastasis extensive vasogenic edema ?-Patient presented after a fall at home. She has a known  history of lung cancer with brain mets s/p resection, chemoradiation and recent scans showing extensive hemorrhagic mets and vasogenic edema. New CT head on admission showed superimposed small traumatic subdural hemorrhage. Neurosurgery was consulted and patient was started on Decadron, currently being tapered down. She was transitioned to 4 mg daily which we will continue until seen by her neurosurgeon or oncologist. Continue Keppra for seizure prophylaxis. ?-Palliative care consultation was obtained.  Patient declined hospice care. ?-Family unable will take care at home.  Medicaid filed.  Disposition pending.   Continue to await placement ?  ?History of DVT/PE on chronic anticoagulation  -PE was diagnosed on 05/08/2021. Despite hemorrhagic metastasis in brain, patient elects to continue Eliquis.  She understands the high risk of intracranial bleeding.   ?  ?Type II diabetes mellitus ?Hypoglycemia -A1c 6.5 in February 2023. Patient has good appetite and wants to continue regular diet.  Blood sugar level fluctuating. Continue glargine 15 u QHS and Aspart 5U TID + SSI. ?Monitor blood sugars ?  ?  ?  ? ?DVT prophylaxis: ?apixaban (ELIQUIS) tablet 5 mg  ? ?  Code Status: DNR ? ?Family Communication: None at bedside ? ?Patient status: Inpatient ? ?Patient is from : Home ? ?Anticipated discharge to: Skilled nursing facility ? ?Estimated DC date: likely tomorrow ? ? ?Consultants: Neurosurgery ? ?Procedures: None ? ?Antimicrobials:  ?Anti-infectives (From admission, onward)  ? ? None  ? ?  ? ? ?Subjective: ?Seen and examined at the  bedside this morning.  Lying in bed without any complaints ? ?Objective: ?Vitals:  ? 08/12/21  1321 08/12/21 1608 08/12/21 2017 08/13/21 0551  ?BP: (!) 101/53 (!) 105/57 120/69 116/62  ?Pulse: (!) 107 (!) 107 (!) 110 97  ?Resp: 18 18 18 18   ?Temp: 98.5 ?F (36.9 ?C) 98.2 ?F (36.8 ?C) 97.9 ?F (36.6 ?C) 97.8 ?F (36.6 ?C)  ?TempSrc: Oral  Oral Oral  ?SpO2: 95% 97% 96% 98%  ?Weight:      ?Height:       ? ? ?Intake/Output Summary (Last 24 hours) at 08/13/2021 1120 ?Last data filed at 08/13/2021 0500 ?Gross per 24 hour  ?Intake --  ?Output 900 ml  ?Net -900 ml  ? ?Filed Weights  ? 07/07/21 0238 07/08/21 0326  ?Weight: (P) 57.6 kg 56.4 kg  ? ? ?Examination: ? ?General exam: Lying in bed without any complaints, comfortable ? ? ?Data Reviewed: I have personally reviewed following labs and imaging studies ? ?CBC: ?No results for input(s): WBC, NEUTROABS, HGB, HCT, MCV, PLT in the last 168 hours. ?Basic Metabolic Panel: ?Recent Labs  ?Lab 08/08/21 ?0050  ?NA 141  ?K 3.9  ?CL 110  ?CO2 25  ?GLUCOSE 145*  ?BUN 22  ?CREATININE 0.59  ?CALCIUM 8.8*  ? ? ? ?No results found for this or any previous visit (from the past 240 hour(s)).  ? ?Radiology Studies: ?No results found. ? ?Scheduled Meds: ? amitriptyline  50 mg Oral QHS  ? apixaban  5 mg Oral BID  ? atorvastatin  40 mg Oral QPM  ? dexamethasone  4 mg Oral Daily  ? famotidine  20 mg Oral Daily  ? insulin aspart  0-15 Units Subcutaneous TID WC  ? insulin aspart  5 Units Subcutaneous TID WC  ? insulin glargine-yfgn  15 Units Subcutaneous QHS  ? levETIRAcetam  500 mg Oral BID  ? vitamin B-12  1,000 mcg Oral Daily  ? ?Continuous Infusions: ? ? LOS: 34 days  ? ?Shelly Coss, MD ?[facility] ?P5/06/2021, 11:20 AM   ?

## 2021-08-13 NOTE — Progress Notes (Addendum)
CSW spoke with [place] at [facility] who states once the family makes the appropriate financial payment to the facility that the patient can be officially accepted. The patient can discharge to [facility] on Thursday 5/4. ? ?MD aware of tentative discharge plan. ? ?Madilyn Fireman, MSW, LCSW ?Transitions of Care  Clinical Social Worker II ?[phone] ? ?

## 2021-08-13 NOTE — Progress Notes (Signed)
Mobility Specialist Progress Note: ? ? 08/13/21 1101  ?Mobility  ?Activity Dangled on edge of bed;Stood at bedside  ?Level of Assistance Maximum assist, patient does 25-49%  ?Assistive Device Stedy  ?Activity Response Tolerated poorly  ?$Mobility charge 1 Mobility  ? ?Pt received in bed willing to participate in mobility. Complaints of soreness. With maxA pt was not able to clear hips and stand . Pt stating she needs to lay down. Left in bed with call bell in reach and all needs met.  ? ?Katelyn Kennedy ?Mobility Specialist ?Primary Phone [phone] ? ?

## 2021-08-14 DIAGNOSIS — R531 Weakness: Secondary | ICD-10-CM | POA: Diagnosis not present

## 2021-08-14 LAB — GLUCOSE, CAPILLARY
Glucose-Capillary: 110 mg/dL — ABNORMAL HIGH (ref 70–99)
Glucose-Capillary: 297 mg/dL — ABNORMAL HIGH (ref 70–99)

## 2021-08-14 MED ORDER — AMITRIPTYLINE HCL 50 MG PO TABS
50.0000 mg | ORAL_TABLET | Freq: Every day | ORAL | 3 refills | Status: AC
Start: 2021-08-14 — End: ?

## 2021-08-14 MED ORDER — INSULIN ASPART 100 UNIT/ML IJ SOLN: 5.0000 [IU] | Freq: Three times a day (TID) | INTRAMUSCULAR | 11 refills | Status: AC

## 2021-08-14 MED ORDER — INSULIN GLARGINE-YFGN 100 UNIT/ML ~~LOC~~ SOLN: 15.0000 [IU] | Freq: Every day | SUBCUTANEOUS | 11 refills | Status: AC

## 2021-08-14 MED ORDER — DEXAMETHASONE 4 MG PO TABS: 4.0000 mg | ORAL_TABLET | Freq: Every day | ORAL | 0 refills | Status: AC

## 2021-08-14 NOTE — Care Management Important Message (Signed)
Important Message ? ?Patient Details  ?Name: Katelyn Kennedy ?MRN: [id number] ?Date of Birth: December 17, 1945 ? ? ?Medicare Important Message Given:  Yes ? ? ? ? ?Flynn Lininger ?08/14/2021, 2:26 PM ?

## 2021-08-14 NOTE — Progress Notes (Signed)
Mobility Specialist Progress Note: ? ? 08/14/21 1100  ?Mobility  ?Activity Dangled on edge of bed  ?Level of Assistance Moderate assist, patient does 50-74%  ?Assistive Device None  ?Activity Response Tolerated well  ?$Mobility charge 1 Mobility  ? ?Pt received in bed willing to participate in mobility. Complaints of feeling sore. ModA to come to EOB. Pt could not hold sitting position so required MinA to reman sitting while doing leg exercises. Completed leg and arm exercises. Left in bed with call bell in reach and all needs met.   ? ?Katelyn Kennedy ?Mobility Specialist ?Primary Phone [phone] ? ?

## 2021-08-14 NOTE — Discharge Summary (Signed)
Physician Discharge Summary  ?Katelyn Kennedy TKP:[id number] DOB: 1945-10-16 DOA: 07/07/2021 ? ?PCP: [name], MD ? ?Admit date: 07/07/2021 ?Discharge date: 08/14/2021 ? ?Admitted From: Home ?Disposition:  SNF ? ?Discharge Condition:Stable ?CODE STATUS: DNR ?Diet recommendation:  Regular   ? ?Brief/Interim Summary: ? ?76 y.o. female with PMH significant for DM2, HTN, PVD, lung cancer with brain mets s/p resection and chemoradiation, DVT/PE on Eliquis. Patient was brought to the ED from home on 3/27 from home after she slid out of bed and fell to the floor. She was being taken care at home by family members over the years of 34 and their own physical limitations.  Family members were not able to pick her up when she fell and hence EMS was called and she was brought to the ED. In the ED, CT scan of head (compared to CT scan and MRIs from last 2 months) showed extensive dural based hemorrhagic metastatic disease without significant change in extensive vasogenic edema or midline shift small volume extra-axial hemorrhage along the right frontal convexity with superimposed traumatic hemorrhage. Patient was admitted to hospital service. Her hospitalization was prolonged because patient was in a phase of denial of the severity of her condition.  At one point patient tried to leave AMA but she did not have enough family support at home. Family members unable to take her home and provide any support. She can be discharged to [facility]  today. ? ? ?Following problems were addressed during her hospitalization: ? ?Small traumatic subdural hemorrhage ?Non-small cell lung cancer ?Extensive hemorrhagic brain metastasis extensive vasogenic edema ?-Patient presented after a fall at home. She has a known history of lung cancer with brain mets s/p resection, chemoradiation and recent scans showing extensive hemorrhagic mets and vasogenic edema. New CT head on admission showed superimposed small traumatic subdural hemorrhage.  Neurosurgery was consulted and patient was started on Decadron, currently being tapered down. She was transitioned to 4 mg daily which we will continue until seen by her neurosurgeon or oncologist. Continue Keppra for seizure prophylaxis. ?-Palliative care consultation was obtained.  Patient declined hospice care. ?-Family unable will take care at home.  Medicaid filed. ?  ?History of DVT/PE on chronic anticoagulation  -PE was diagnosed on 05/08/2021. Despite hemorrhagic metastasis in brain, patient elects to continue Eliquis.  She understands the high risk of intracranial bleeding.   ?  ?Type II diabetes mellitus ?Hypoglycemia -A1c 6.5 in February 2023. Patient has good appetite and wants to continue regular diet.  Blood sugar level fluctuating. Continue glargine 15 u QHS and Aspart 5U TID . ?Monitor blood sugars ?  ? ? ? ?Discharge Diagnoses:  ?Principal Problem: ?  Fall at home secondary to weakness ?Active Problems: ?  Diet-controlled type 2 diabetes mellitus ([place]) ?  Vasogenic brain edema ([place]) ?  Chronic anticoagulation ?  Non-small cell lung cancer metastatic to brain [facility]) with vasogenic brain edema ?  Normocytic anemia ?  Leukocytosis ?  History of DVT (deep vein thrombosis) ?  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 ? ? ?Discharge Instructions ? ?Discharge Instructions   ? ? Diet general   Complete by: As directed ?  ? Discharge instructions   Complete by: As directed ?  ? 1)Please take prescribed medications as instructed ?2)Follow up with oncology as an outpatient  ? Increase activity slowly   Complete by: As directed ?  ? ?  ? ?Allergies as of 08/14/2021   ? ?   Reactions  ?  Codeine Nausea Only  ? ?  ? ?  ?Medication List  ?  ? ?STOP taking these medications   ? ?insulin glargine 100 UNIT/ML Solostar Pen ?Commonly known as: LANTUS ?  ?lisinopril 2.5 MG tablet ?Commonly known as: ZESTRIL ?  ? ?  ? ?TAKE these medications   ? ?Accu-Chek Guide test strip ?Generic drug: glucose  blood ?Check blood sugar three times a day ?  ?Accu-Chek Softclix Lancets lancets ?Check blood sugar three times a day ?  ?albuterol (2.5 MG/3ML) 0.083% nebulizer solution ?Commonly known as: PROVENTIL ?Take 3 mLs (2.5 mg total) by nebulization every 6 (six) hours as needed for wheezing or shortness of breath. ?  ?amitriptyline 50 MG tablet ?Commonly known as: ELAVIL ?Take 1 tablet (50 mg total) by mouth at bedtime. ?  ?apixaban 5 MG Tabs tablet ?Commonly known as: ELIQUIS ?Take 1 tablet (5 mg total) by mouth 2 (two) times daily. ?  ?atorvastatin 40 MG tablet ?Commonly known as: LIPITOR ?1 tablet  po daily ?What changed:  ?how much to take ?how to take this ?when to take this ?additional instructions ?  ?blood glucose meter kit and supplies Kit ?Dispense based on patient and insurance preference. Use up to four times daily as directed. ?  ?cholecalciferol 25 MCG (1000 UNIT) tablet ?Commonly known as: VITAMIN D ?Take 1,000 Units by mouth daily. ?  ?dexamethasone 4 MG tablet ?Commonly known as: DECADRON ?Take 1 tablet (4 mg total) by mouth daily. ?What changed:  ?medication strength ?how much to take ?additional instructions ?  ?docusate sodium 100 MG capsule ?Commonly known as: COLACE ?Take 100 mg by mouth daily as needed for mild constipation. ?  ?famotidine 20 MG tablet ?Commonly known as: Pepcid ?Take 1 tablet (20 mg total) by mouth 2 (two) times daily. ?  ?insulin aspart 100 UNIT/ML injection ?Commonly known as: novoLOG ?Inject 5 Units into the skin 3 (three) times daily with meals. ?  ?insulin glargine-yfgn 100 UNIT/ML injection ?Commonly known as: SEMGLEE ?Inject 0.15 mLs (15 Units total) into the skin at bedtime. ?  ?Insulin Pen Needle 31G X 8 MM Misc ?1 Device by Does not apply route daily. For use with insulin pens ?  ?levETIRAcetam 500 MG tablet ?Commonly known as: KEPPRA ?Take 1 tablet (500 mg total) by mouth 2 (two) times daily. ?  ?multivitamin capsule ?Take 1 capsule by mouth daily. Centrum Silver ?   ?polyethylene glycol 17 g packet ?Commonly known as: MIRALAX / GLYCOLAX ?Take 17 g by mouth daily as needed for moderate constipation. ?  ?vitamin B-12 1000 MCG tablet ?Commonly known as: CYANOCOBALAMIN ?Take 1,000 mcg by mouth daily. ?  ? ?  ? ? Contact information for follow-up providers   ? ? [name], MD. Call .   ?Specialty: Family Medicine ?Why: discuss your visit, see when they want to see you in the office ?Contact information: ?[facility] ?[place] [address] ?[phone] ? ? ?  ?  ? ?  ?  ? ? Contact information for after-discharge care   ? ? Destination   ? ? [facility] .   ?Service: Skilled Nursing ?Contact information: ?[address] ?[place] ?[phone] ? ?  ?  ? ?  ?  ? ?  ?  ? ?  ? ?Allergies  ?Allergen Reactions  ? Codeine Nausea Only  ? ? ?Consultations: ?Neurosurgery, palliative care ? ? ?Procedures/Studies: ?No results found. ? ? ? ?Subjective: ?Patient seen and examined at the  bedside this morning.  Hemodynamically stable for discharge today.  Remains comfortable ? ?Discharge Exam: ?Vitals:  ? 08/14/21 0444 08/14/21 0751  ?BP: (!) 94/55 108/67  ?Pulse: 99 (!) 102  ?Resp: 18 18  ?Temp: 97.9 ?F (36.6 ?C) 97.8 ?F (36.6 ?C)  ?SpO2: 96% 94%  ? ?Vitals:  ? 08/13/21 1549 08/13/21 1942 08/14/21 0444 08/14/21 0751  ?BP: (!) 93/58 106/60 (!) 94/55 108/67  ?Pulse: 95 (!) 102 99 (!) 102  ?Resp: _0 ?Temp: 98.5 ?F (36.9 ?C) 98.3 ?F (36.8 ?C) 97.9 ?F (36.6 ?C) 97.8 ?F (36.6 ?C)  ?TempSrc: Oral  Oral   ?SpO2: 92% 96% 96% 94%  ?Weight:      ?Height:      ? ? ?General: Pt is alert, awake, not in acute distress ?Cardiovascular: RRR, S1/S2 +, no rubs, no gallops ?Respiratory: CTA bilaterally, no wheezing, no rhonchi ?Abdominal: Soft, NT, ND, bowel sounds + ?Extremities: no edema, no cyanosis ? ? ? ?The results of significant diagnostics from this hospitalization (including imaging, microbiology, ancillary and laboratory) are listed below for  reference.   ? ? ?Microbiology: ?No results found for this or any previous visit (from the past 240 hour(s)).  ? ?Labs: ?BNP (last 3 results) ?No results for input(s): BNP in the last 8760 hours. ?Basic Metabolic Panel

## 2021-08-14 NOTE — TOC Transition Note (Signed)
Transition of Care (TOC) - CM/SW Discharge Note ? ? ?Patient Details  ?Name: Katelyn Kennedy ?MRN: [id number] ?Date of Birth: 1946-03-19 ? ?Transition of Care (TOC) CM/SW Contact:  ?Curlene Labrum, RN ?Phone Number: ?08/14/2021, 9:19 AM ? ? ?Clinical Narrative:    ?CM spoke with [facility] this morning and the facility will be accepting the patient for admission today.   ? ?[facility] transport is set up for 1230 am this morning and PTAR packet with be available for transport to the facility. ? ?Discharge summary will be sent to the facility via the hub and hard copy placed in the PTAR packet. ? ?Bedside nursing  - Please call report to [facility] at [phone] - Room 401. ? ?CM with DTP Team will continue to follow the patient for discharge to the facility today by ambulance. ? ? ?  ?Barriers to Discharge: Continued Medical Work up, Ship broker, Family Issues ? ? ?Patient Goals and CMS Choice ?Patient states their goals for this hospitalization and ongoing recovery are:: To go to rehab ?CMS Medicare.gov Compare Post Acute Care list provided to:: Patient ?Choice offered to / list presented to : Patient ? ?Discharge Placement ?  ?           ?  ?  ?  ?  ? ?Discharge Plan and Services ?  ?  ?           ?  ?  ?  ?  ?  ?  ?  ?  ?  ?  ? ?Social Determinants of Health (SDOH) Interventions ?  ? ? ?Readmission Risk Interventions ? ?  07/18/2021  ? 12:39 PM 03/19/2021  ?  2:43 PM 11/08/2020  ?  3:25 PM  ?Readmission Risk Prevention Plan  ?Post Dischage Appt   Complete  ?Medication Screening   Complete  ?Transportation Screening Complete Complete Complete  ?PCP or Specialist Appt within 3-5 Days  Complete   ?[place] or Home Care Consult  Complete   ?Social Work Consult for [place] Planning/Counseling  Complete   ?Palliative Care Screening  Not Applicable   ?Medication Review [employer]) Complete Complete   ?PCP or Specialist appointment within 3-5 days of discharge Complete    ?[facility] or Home Care  Consult Complete    ?SW Recovery Care/Counseling Consult Complete    ?Palliative Care Screening Complete    ?Skilled Nursing Facility Complete    ? ? ? ? ? ?

## 2021-08-20 DIAGNOSIS — R5381 Other malaise: Secondary | ICD-10-CM | POA: Diagnosis not present

## 2021-08-20 DIAGNOSIS — C7931 Secondary malignant neoplasm of brain: Secondary | ICD-10-CM | POA: Diagnosis not present

## 2021-08-20 DIAGNOSIS — C3490 Malignant neoplasm of unspecified part of unspecified bronchus or lung: Secondary | ICD-10-CM | POA: Diagnosis not present

## 2021-08-20 DIAGNOSIS — S065XAD Traumatic subdural hemorrhage with loss of consciousness status unknown, subsequent encounter: Secondary | ICD-10-CM | POA: Diagnosis not present

## 2021-08-21 ENCOUNTER — Other Ambulatory Visit: Payer: Self-pay | Admitting: Urology

## 2021-08-22 ENCOUNTER — Encounter: Payer: Self-pay | Admitting: Urology

## 2021-08-22 ENCOUNTER — Other Ambulatory Visit: Payer: PPO | Admitting: Urology

## 2021-08-25 ENCOUNTER — Inpatient Hospital Stay: Payer: PPO | Admitting: Nurse Practitioner

## 2021-08-25 ENCOUNTER — Inpatient Hospital Stay: Payer: PPO | Admitting: Internal Medicine

## 2021-08-25 ENCOUNTER — Telehealth: Payer: Self-pay | Admitting: *Deleted

## 2021-08-25 ENCOUNTER — Inpatient Hospital Stay: Payer: PPO

## 2021-08-25 DIAGNOSIS — C3490 Malignant neoplasm of unspecified part of unspecified bronchus or lung: Secondary | ICD-10-CM | POA: Diagnosis not present

## 2021-08-25 DIAGNOSIS — C7931 Secondary malignant neoplasm of brain: Secondary | ICD-10-CM | POA: Diagnosis not present

## 2021-08-25 DIAGNOSIS — Z7901 Long term (current) use of anticoagulants: Secondary | ICD-10-CM | POA: Diagnosis not present

## 2021-08-25 DIAGNOSIS — I2699 Other pulmonary embolism without acute cor pulmonale: Secondary | ICD-10-CM | POA: Diagnosis not present

## 2021-08-25 NOTE — Telephone Encounter (Signed)
Spoke with [name] and she advised that patient is now under hospice. Canceled future appointments. ? ?

## 2021-09-01 ENCOUNTER — Inpatient Hospital Stay: Payer: PPO

## 2021-09-02 ENCOUNTER — Ambulatory Visit: Payer: PPO | Admitting: Internal Medicine

## 2021-09-09 ENCOUNTER — Other Ambulatory Visit: Payer: PPO

## 2021-09-11 ENCOUNTER — Ambulatory Visit: Payer: PPO | Admitting: Internal Medicine

## 2021-09-20 ENCOUNTER — Inpatient Hospital Stay (HOSPITAL_COMMUNITY)
Admission: EM | Admit: 2021-09-20 | Discharge: 2021-10-11 | DRG: 951 | Disposition: E | Payer: PPO | Attending: Internal Medicine | Admitting: Internal Medicine

## 2021-09-20 ENCOUNTER — Encounter (HOSPITAL_COMMUNITY): Payer: Self-pay | Admitting: Emergency Medicine

## 2021-09-20 ENCOUNTER — Other Ambulatory Visit: Payer: Self-pay

## 2021-09-20 DIAGNOSIS — Z86718 Personal history of other venous thrombosis and embolism: Secondary | ICD-10-CM

## 2021-09-20 DIAGNOSIS — Z794 Long term (current) use of insulin: Secondary | ICD-10-CM

## 2021-09-20 DIAGNOSIS — Z515 Encounter for palliative care: Principal | ICD-10-CM

## 2021-09-20 DIAGNOSIS — Z7401 Bed confinement status: Secondary | ICD-10-CM | POA: Diagnosis not present

## 2021-09-20 DIAGNOSIS — Z7901 Long term (current) use of anticoagulants: Secondary | ICD-10-CM | POA: Diagnosis not present

## 2021-09-20 DIAGNOSIS — R4182 Altered mental status, unspecified: Secondary | ICD-10-CM | POA: Diagnosis present

## 2021-09-20 DIAGNOSIS — C3490 Malignant neoplasm of unspecified part of unspecified bronchus or lung: Secondary | ICD-10-CM | POA: Diagnosis not present

## 2021-09-20 DIAGNOSIS — C719 Malignant neoplasm of brain, unspecified: Secondary | ICD-10-CM | POA: Diagnosis present

## 2021-09-20 DIAGNOSIS — Z9221 Personal history of antineoplastic chemotherapy: Secondary | ICD-10-CM | POA: Diagnosis not present

## 2021-09-20 DIAGNOSIS — Z86711 Personal history of pulmonary embolism: Secondary | ICD-10-CM

## 2021-09-20 DIAGNOSIS — Z923 Personal history of irradiation: Secondary | ICD-10-CM

## 2021-09-20 DIAGNOSIS — Z87891 Personal history of nicotine dependence: Secondary | ICD-10-CM

## 2021-09-20 DIAGNOSIS — I10 Essential (primary) hypertension: Secondary | ICD-10-CM | POA: Diagnosis not present

## 2021-09-20 DIAGNOSIS — E1151 Type 2 diabetes mellitus with diabetic peripheral angiopathy without gangrene: Secondary | ICD-10-CM | POA: Diagnosis not present

## 2021-09-20 DIAGNOSIS — G936 Cerebral edema: Secondary | ICD-10-CM | POA: Diagnosis present

## 2021-09-20 DIAGNOSIS — Z79899 Other long term (current) drug therapy: Secondary | ICD-10-CM | POA: Diagnosis not present

## 2021-09-20 DIAGNOSIS — Z66 Do not resuscitate: Secondary | ICD-10-CM | POA: Diagnosis present

## 2021-09-20 DIAGNOSIS — Z833 Family history of diabetes mellitus: Secondary | ICD-10-CM

## 2021-09-20 DIAGNOSIS — C7931 Secondary malignant neoplasm of brain: Secondary | ICD-10-CM | POA: Diagnosis present

## 2021-09-20 DIAGNOSIS — R404 Transient alteration of awareness: Secondary | ICD-10-CM | POA: Diagnosis not present

## 2021-09-20 DIAGNOSIS — L8990 Pressure ulcer of unspecified site, unspecified stage: Secondary | ICD-10-CM | POA: Diagnosis present

## 2021-09-20 DIAGNOSIS — Z7189 Other specified counseling: Secondary | ICD-10-CM | POA: Diagnosis not present

## 2021-09-20 DIAGNOSIS — E785 Hyperlipidemia, unspecified: Secondary | ICD-10-CM | POA: Diagnosis present

## 2021-09-20 DIAGNOSIS — R000 Tachycardia, unspecified: Secondary | ICD-10-CM | POA: Diagnosis present

## 2021-09-20 DIAGNOSIS — L89312 Pressure ulcer of right buttock, stage 2: Secondary | ICD-10-CM | POA: Diagnosis present

## 2021-09-20 LAB — CBC WITH DIFFERENTIAL/PLATELET
Abs Immature Granulocytes: 0.79 10*3/uL — ABNORMAL HIGH (ref 0.00–0.07)
Basophils Absolute: 0.1 10*3/uL (ref 0.0–0.1)
Basophils Relative: 1 %
Eosinophils Absolute: 0 10*3/uL (ref 0.0–0.5)
Eosinophils Relative: 0 %
HCT: 43 % (ref 36.0–46.0)
Hemoglobin: 12.4 g/dL (ref 12.0–15.0)
Immature Granulocytes: 5 %
Lymphocytes Relative: 14 %
Lymphs Abs: 2 10*3/uL (ref 0.7–4.0)
MCH: 28.2 pg (ref 26.0–34.0)
MCHC: 28.8 g/dL — ABNORMAL LOW (ref 30.0–36.0)
MCV: 97.7 fL (ref 80.0–100.0)
Monocytes Absolute: 1.6 10*3/uL — ABNORMAL HIGH (ref 0.1–1.0)
Monocytes Relative: 11 %
Neutro Abs: 10.2 10*3/uL — ABNORMAL HIGH (ref 1.7–7.7)
Neutrophils Relative %: 69 %
Platelets: 367 10*3/uL (ref 150–400)
RBC: 4.4 MIL/uL (ref 3.87–5.11)
RDW: 19 % — ABNORMAL HIGH (ref 11.5–15.5)
WBC: 14.7 10*3/uL — ABNORMAL HIGH (ref 4.0–10.5)
nRBC: 0.2 % (ref 0.0–0.2)

## 2021-09-20 LAB — LACTIC ACID, PLASMA: Lactic Acid, Venous: 2.1 mmol/L (ref 0.5–1.9)

## 2021-09-20 MED ORDER — DEXAMETHASONE SODIUM PHOSPHATE 4 MG/ML IJ SOLN
1.0000 mg | INTRAMUSCULAR | Status: DC
  Administered 2021-09-20 – 2021-09-21 (×2): 1 mg via INTRAVENOUS
  Filled 2021-09-20 (×2): qty 1

## 2021-09-20 MED ORDER — ACETAMINOPHEN 650 MG RE SUPP: 650.0000 mg | Freq: Four times a day (QID) | RECTAL | Status: DC | PRN

## 2021-09-20 MED ORDER — ONDANSETRON 4 MG PO TBDP: 4.0000 mg | ORAL_TABLET | Freq: Four times a day (QID) | ORAL | Status: DC | PRN

## 2021-09-20 MED ORDER — GLYCOPYRROLATE 1 MG PO TABS: 1.0000 mg | ORAL_TABLET | ORAL | Status: DC | PRN

## 2021-09-20 MED ORDER — ONDANSETRON HCL 4 MG/2ML IJ SOLN: 4.0000 mg | Freq: Four times a day (QID) | INTRAMUSCULAR | Status: DC | PRN

## 2021-09-20 MED ORDER — MORPHINE SULFATE (CONCENTRATE) 10 MG/0.5ML PO SOLN: 5.0000 mg | ORAL | Status: DC | PRN

## 2021-09-20 MED ORDER — SODIUM CHLORIDE 0.9% FLUSH: 3.0000 mL | INTRAVENOUS | Status: DC | PRN

## 2021-09-20 MED ORDER — MORPHINE SULFATE (CONCENTRATE) 10 MG/0.5ML PO SOLN
5.0000 mg | ORAL | Status: DC
  Administered 2021-09-20 – 2021-09-22 (×8): 5 mg via SUBLINGUAL
  Filled 2021-09-20 (×8): qty 0.5

## 2021-09-20 MED ORDER — LORAZEPAM 2 MG/ML IJ SOLN
2.0000 mg | INTRAMUSCULAR | Status: DC | PRN
Start: 2021-09-20 — End: 2021-09-22

## 2021-09-20 MED ORDER — LEVETIRACETAM IN NACL 500 MG/100ML IV SOLN
500.0000 mg | Freq: Two times a day (BID) | INTRAVENOUS | Status: DC
Start: 2021-09-20 — End: 2021-09-22
  Administered 2021-09-20 – 2021-09-22 (×4): 500 mg via INTRAVENOUS
  Filled 2021-09-20 (×5): qty 100

## 2021-09-20 MED ORDER — MORPHINE SULFATE (PF) 2 MG/ML IV SOLN: 1.0000 mg | INTRAVENOUS | Status: DC | PRN

## 2021-09-20 MED ORDER — LORAZEPAM 2 MG/ML IJ SOLN
1.0000 mg | INTRAMUSCULAR | Status: DC | PRN
Start: 2021-09-20 — End: 2021-09-20

## 2021-09-20 MED ORDER — GLYCOPYRROLATE 0.2 MG/ML IJ SOLN: 0.2000 mg | INTRAMUSCULAR | Status: DC | PRN

## 2021-09-20 MED ORDER — BIOTENE DRY MOUTH MT LIQD: 15.0000 mL | OROMUCOSAL | Status: DC | PRN

## 2021-09-20 MED ORDER — POLYVINYL ALCOHOL 1.4 % OP SOLN: 1.0000 [drp] | Freq: Four times a day (QID) | OPHTHALMIC | Status: DC | PRN

## 2021-09-20 MED ORDER — SODIUM CHLORIDE 0.9% FLUSH
3.0000 mL | Freq: Two times a day (BID) | INTRAVENOUS | Status: DC
  Administered 2021-09-20 – 2021-09-21 (×4): 3 mL via INTRAVENOUS

## 2021-09-20 MED ORDER — GLYCOPYRROLATE 0.2 MG/ML IJ SOLN
0.2000 mg | INTRAMUSCULAR | Status: DC | PRN
  Administered 2021-09-22: 0.2 mg via INTRAVENOUS
  Filled 2021-09-20: qty 1

## 2021-09-20 MED ORDER — ACETAMINOPHEN 325 MG PO TABS: 650.0000 mg | ORAL_TABLET | Freq: Four times a day (QID) | ORAL | Status: DC | PRN

## 2021-09-20 MED ORDER — SODIUM CHLORIDE 0.9 % IV SOLN: 250.0000 mL | INTRAVENOUS | Status: DC | PRN

## 2021-09-20 NOTE — ED Provider Notes (Signed)
[facility] EMERGENCY DEPARTMENT Provider Note   CSN: [id number] Arrival date & time: 09/29/2021  1337     History  Chief Complaint  Patient presents with   Altered Mental Status    Katelyn Kennedy is a 76 y.o. female.  Presented to ER due to concern for altered mental status.  Patient has history of  DM2, HTN, PVD, lung cancer with brain mets s/p resection and chemoradiation, DVT/PE.  Currently residing at [facility].  Per sister at bedside, she is enrolled in hospice care and receiving comfort measures at that facility.  Over the past couple days she has noticed significant decline in her status.  Seems to have progressive confusion, lethargy.  History is limited due to patient's lethargy, unable to provide any history.  History is obtained from EMS report, discussion with sister at bedside and review of chart.  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  HPI     Home Medications Prior to Admission medications   Medication Sig Start Date End Date Taking? Authorizing Provider  amitriptyline (ELAVIL) 50 MG tablet Take 1 tablet (50 mg total) by mouth at bedtime. Patient taking differently: Take 25 mg by mouth at bedtime. 08/14/21  Yes Shelly Coss, MD  apixaban (ELIQUIS) 5 MG TABS tablet Take 1 tablet (5 mg total) by mouth 2 (two) times daily. 05/12/21  Yes Pokhrel, Laxman, MD  dexamethasone (DECADRON) 2 MG tablet Take 1 mg by mouth daily. Started 09-11-21 x 14 days 08/26/21  Yes [provider]  famotidine (PEPCID) 20 MG tablet Take 1 tablet (20 mg total) by mouth 2 (two) times daily. 07/01/21  Yes Bedsole, Amy E, MD  ferrous sulfate 325 (65 FE) MG tablet Take 325 mg by mouth daily with breakfast.   Yes [provider]  guaiFENesin (ROBITUSSIN) 100 MG/5ML liquid Take 5 mLs by mouth 2 (two) times daily.   Yes [provider]  levalbuterol  (XOPENEX) 0.63 MG/3ML nebulizer solution Inhale 3 mLs into the lungs daily. 08/21/21  Yes [provider]  levETIRAcetam (KEPPRA) 500 MG tablet Take 1 tablet (500 mg total) by mouth 2 (two) times daily. 05/01/21  Yes Vaslow, Acey Lav, MD  metFORMIN (GLUCOPHAGE-XR) 500 MG 24 hr tablet Take 100 mg by mouth daily with breakfast. Started 09-11-21 for 30 days   Yes [provider]  senna-docusate (SENOKOT-S) 8.6-50 MG tablet Take 1 tablet by mouth daily. 08/19/21  Yes [provider]  Accu-Chek Softclix Lancets lancets Check blood sugar three times a day 07/01/21   Diona Browner, Amy E, MD  albuterol (PROVENTIL) (2.5 MG/3ML) 0.083% nebulizer solution Take 3 mLs (2.5 mg total) by nebulization every 6 (six) hours as needed for wheezing or shortness of breath. 07/09/21   Phillips Grout, MD  atorvastatin (LIPITOR) 40 MG tablet 1 tablet  po daily Patient taking differently: Take 40 mg by mouth every evening. 10/15/20   Bedsole, Amy E, MD  blood glucose meter kit and supplies KIT Dispense based on patient and insurance preference. Use up to four times daily as directed. 06/06/21   Donne Hazel, MD  cholecalciferol (VITAMIN D) 25 MCG (1000 UNIT) tablet Take 1,000 Units by mouth daily.    [provider]  docusate sodium (COLACE) 100 MG capsule Take 100 mg by mouth daily as needed for mild constipation.    [provider]  glucose blood (ACCU-CHEK GUIDE) test strip Check blood sugar  three times a day 07/01/21   Bedsole, Amy E, MD  insulin aspart (NOVOLOG) 100 UNIT/ML injection Inject 5 Units into the skin 3 (three) times daily with meals. 08/14/21   Shelly Coss, MD  insulin glargine-yfgn (SEMGLEE) 100 UNIT/ML injection Inject 0.15 mLs (15 Units total) into the skin at bedtime. 08/14/21   Shelly Coss, MD  Insulin Pen Needle 31G X 8 MM MISC 1 Device by Does not apply route daily. For use with insulin pens 06/06/21   Donne Hazel, MD  Multiple Vitamin (MULTIVITAMIN) capsule  Take 1 capsule by mouth daily. Centrum Silver    [provider]  polyethylene glycol (MIRALAX / GLYCOLAX) 17 g packet Take 17 g by mouth daily as needed for moderate constipation. 03/18/21   Lavina Hamman, MD  vitamin B-12 (CYANOCOBALAMIN) 1000 MCG tablet Take 1,000 mcg by mouth daily.    [provider]      Allergies    Codeine    Review of Systems   Review of Systems  Unable to perform ROS: Mental status change    Physical Exam Updated Vital Signs BP 117/79 (BP Location: Right Arm)   Pulse (!) 133   Temp 98.6 F (37 C) (Rectal)   Resp 20   SpO2 100%  Physical Exam Constitutional:      Comments: Lethargic, somnolent, will open eyes and follow commands with stimulation  HENT:     Head: Normocephalic and atraumatic.  Eyes:     Pupils: Pupils are equal, round, and reactive to light.  Cardiovascular:     Rate and Rhythm: Tachycardia present.     Pulses: Normal pulses.     Heart sounds: Normal heart sounds.  Pulmonary:     Comments: Some tachypnea, mild increased work of breathing Abdominal:     General: Abdomen is flat.     Tenderness: There is no abdominal tenderness. There is no guarding.  Neurological:     Comments: Lethargic, opens eyes to voice, follows only basic commands, confused verbal response     ED Results / Procedures / Treatments   Labs (all labs ordered are listed, but only abnormal results are displayed) Labs Reviewed  LACTIC ACID, PLASMA - Abnormal; Notable for the following components:      Result Value   Lactic Acid, Venous 2.1 (*)    All other components within normal limits  CBC WITH DIFFERENTIAL/PLATELET - Abnormal; Notable for the following components:   WBC 14.7 (*)    MCHC 28.8 (*)    RDW 19.0 (*)    Neutro Abs 10.2 (*)    Monocytes Absolute 1.6 (*)    Abs Immature Granulocytes 0.79 (*)    All other components within normal limits  CBC WITH DIFFERENTIAL/PLATELET    EKG None  Radiology No results  found.  Procedures Procedures    Medications Ordered in ED Medications  sodium chloride flush (NS) 0.9 % injection 3 mL (has no administration in time range)  sodium chloride flush (NS) 0.9 % injection 3 mL (has no administration in time range)  0.9 %  sodium chloride infusion (has no administration in time range)  morphine CONCENTRATE 10 MG/0.5ML oral solution 5 mg (has no administration in time range)    Or  morphine CONCENTRATE 10 MG/0.5ML oral solution 5 mg (has no administration in time range)  LORazepam (ATIVAN) injection 1 mg (has no administration in time range)  polyvinyl alcohol (LIQUIFILM TEARS) 1.4 % ophthalmic solution 1 drop (has no administration in time range)  levETIRAcetam (KEPPRA) IVPB 500 mg/100 mL premix (has no administration in time range)  dexamethasone (DECADRON) injection 1 mg (has no administration in time range)    ED Course/ Medical Decision Making/ A&P                           Medical Decision Making Amount and/or Complexity of Data Reviewed Labs: ordered.  Risk Decision regarding hospitalization.   76 year old lady presenting to ER for altered mental status.  She is significant history of lung cancer with extensive metastases to brain.  On last admission she was found to have hemorrhagic metastases, extensive vasogenic edema.  Per family at bedside patient has been enrolled in hospice services.  Patient today is lethargic, tachypneic, very poor mental status.  Tachycardic.  Goals of care conversation with sister at bedside who states she is medical power of attorney.  She states that all family is in agreement that focus for her care at this time is comfort.  Patient already has DNR/DNI.  I discussed the case with palliative care, she is very familiar with case and had seen patient on last admission.  Given her declining status that she recommends admission for comfort measures, palliative care team will evaluate patient later today.  I discussed case  with Dr. Eliberto Ivory who will admit patient.        Final Clinical Impression(s) / ED Diagnoses Final diagnoses:  Admission for palliative care  Metastasis to brain [facility])  Altered mental status, unspecified altered mental status type    Rx / DC Orders ED Discharge Orders     None         Lucrezia Starch, MD 09/15/2021 1614

## 2021-09-20 NOTE — Assessment & Plan Note (Signed)
Continue IV keppra and 1mg  dexamethasone  Ativan prn for seizure activity

## 2021-09-20 NOTE — ED Notes (Signed)
Per pt sister, pt has brain cancer and is normally alert and can talk but very soft spoken. For two days, pt has been more altered and has not been eating well.

## 2021-09-20 NOTE — ED Notes (Signed)
5mg  morphine wasted with Camryn, RN

## 2021-09-20 NOTE — Assessment & Plan Note (Signed)
Turning patient q2 hours per comfort care orders Nursing to bandage/wound care if needed

## 2021-09-20 NOTE — Assessment & Plan Note (Signed)
76 year old female on hospice secondary to non small cell lung cancer with mets to the brain who has had a progressive decline over the past month, but more so over the past 2 days with limited PO intake, unresponsiveness and tachycardia.  -admit to palliative care floor, patient is on hospice and is on full comfort care. [facility] has no room so will admit here -comfort care order set utilized  -palliative care consulted -will continue IV keppra and her 1mg  of dexamethasone with PRN ativan for seizures

## 2021-09-20 NOTE — H&P (Signed)
History and Physical    Patient: Katelyn Kennedy MRN:[id number] DOB: 10/30/1945 DOA: 09/30/2021 DOS: the patient was seen and examined on 09/25/2021 PCP: Bedsole, Amy E, MD  Patient coming from: SNF: [facility]    Chief Complaint: AMS  HPI: Katelyn Kennedy is a 75 y.o. 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 She is somnolent with some difficulty breathing. She is currently on hospice at her SNF. Her sister in law gives the history. She was with her this morning and she wouldn't talk or respond to her. She would look at her. When they tried to give her water she held it in her mouth and had a hard time swallowing this.  She tells me her heart rate was elevated and due to her AMS they sent her to ED.   Her sister in law denies any recent illness. States she has been bed bound x 1 month and just had a slow decline in mental status/PO intake and has been sleeping a lot.     ER Course:  vitals: afebrile, bp: 117/79, HR: 133, RR: 20, oxygen: 100%RA Pertinent labs:  In ED: palliative care consulted. Recommended admit for comfort care. Inpatient hospice is full and outside of SNF comfort level of care.    Review of Systems: unable to review all systems due to the inability of the patient to answer questions. Past Medical History:  Diagnosis Date   Allergic rhinitis    Arthritis    Diabetes mellitus without complication (HCC)    diet control/no meds per pt   Dyspnea    Essential hypertension 04/11/2019   History of chicken pox    History of hiatal hernia    History of radiation therapy    right lung - 06/28/2017-08/11/2017  Dr John Moody   History of radiation therapy    SRS brain - 11/29/2020-12/04/2020  Dr John Moody/Matthew Manning   History of  radiation therapy    Whole brain - 03/10/2021-03/19/2021 Dr James Kinard   lung ca dx'd 04/2017   Lung cancer (HCC)    Peripheral vascular disease (HCC)    Pneumonia    PONV (postoperative nausea and vomiting)    Smoker    Past Surgical History:  Procedure Laterality Date   ANKLE FRACTURE SURGERY Left 10 years ago   "hard to wake up from anesthesia" per pt   APPLICATION OF CRANIAL NAVIGATION Right 11/07/2020   Procedure: APPLICATION OF CRANIAL NAVIGATION;  Surgeon: Pool, Henry, MD;  Location: [facility] OR;  Service: Neurosurgery;  Laterality: Right;   BARTHOLYN GLAND REMOVAL  1981   BREAST BIOPSY Right 1978   BENIGN CYST   BREAST EXCISIONAL BIOPSY Left 2011   Benign   BREAST EXCISIONAL BIOPSY Right 1985   Benign    CATARACT EXTRACTION     CATARACT EXTRACTION W/ INTRAOCULAR LENS IMPLANT Bilateral    COLONOSCOPY  2011   CRANIOTOMY Right 11/07/2020   Procedure: Right Sided Craniotomy for Tumor;  Surgeon: Pool, Henry, MD;  Location: [facility] OR;  Service: Neurosurgery;  Laterality: Right;   ELECTROMAGNETIC NAVIGATION BROCHOSCOPY N/A 06/08/2017   Procedure: ELECTROMAGNETIC NAVIGATION BRONCHOSCOPY;  Surgeon: Kasa, Kurian, MD;  Location: [facility] ORS;  Service: Cardiopulmonary;  Laterality: N/A;   FRACTURE SURGERY     POLYPECTOMY  2011   TRANSURETHRAL   RESECTION OF BLADDER TUMOR WITH MITOMYCIN-C N/A 05/04/2017   Procedure: TRANSURETHRAL RESECTION OF BLADDER TUMOR WITH gemcitabine;  Surgeon: Stoioff, Scott C, MD;  Location: [facility] ORS;  Service: Urology;  Laterality: N/A;   TUBAL LIGATION     Social History:  reports that she quit smoking about 4 years ago. Her smoking use included cigarettes. She has a 20.00 pack-year smoking history. She has never used smokeless tobacco. She reports that she does not currently use alcohol. She reports that she does not use drugs.  Allergies  Allergen Reactions   Codeine Nausea Only    Family History  Problem Relation Age of Onset   Diabetes Mother    Hypothyroidism  Mother    Cancer Sister 64       BREAST   Breast cancer Sister    Hypothyroidism Sister    Cancer Maternal Grandmother        COLON   Colon cancer Maternal Grandmother 86   Stomach cancer Neg Hx     Prior to Admission medications   Medication Sig Start Date End Date Taking? Authorizing Provider  Accu-Chek Softclix Lancets lancets Check blood sugar three times a day 07/01/21   Bedsole, Amy E, MD  albuterol (PROVENTIL) (2.5 MG/3ML) 0.083% nebulizer solution Take 3 mLs (2.5 mg total) by nebulization every 6 (six) hours as needed for wheezing or shortness of breath. 07/09/21   David, Rachal A, MD  amitriptyline (ELAVIL) 50 MG tablet Take 1 tablet (50 mg total) by mouth at bedtime. 08/14/21   Adhikari, Amrit, MD  apixaban (ELIQUIS) 5 MG TABS tablet Take 1 tablet (5 mg total) by mouth 2 (two) times daily. 05/12/21   Pokhrel, Laxman, MD  atorvastatin (LIPITOR) 40 MG tablet 1 tablet  po daily Patient taking differently: Take 40 mg by mouth every evening. 10/15/20   Bedsole, Amy E, MD  blood glucose meter kit and supplies KIT Dispense based on patient and insurance preference. Use up to four times daily as directed. 06/06/21   Chiu, Stephen K, MD  cholecalciferol (VITAMIN D) 25 MCG (1000 UNIT) tablet Take 1,000 Units by mouth daily.    [provider]  docusate sodium (COLACE) 100 MG capsule Take 100 mg by mouth daily as needed for mild constipation.    [provider]  famotidine (PEPCID) 20 MG tablet Take 1 tablet (20 mg total) by mouth 2 (two) times daily. 07/01/21   Bedsole, Amy E, MD  glucose blood (ACCU-CHEK GUIDE) test strip Check blood sugar three times a day 07/01/21   Bedsole, Amy E, MD  insulin aspart (NOVOLOG) 100 UNIT/ML injection Inject 5 Units into the skin 3 (three) times daily with meals. 08/14/21   Adhikari, Amrit, MD  insulin glargine-yfgn (SEMGLEE) 100 UNIT/ML injection Inject 0.15 mLs (15 Units total) into the skin at bedtime. 08/14/21   Adhikari, Amrit, MD  Insulin Pen  Needle 31G X 8 MM MISC 1 Device by Does not apply route daily. For use with insulin pens 06/06/21   Chiu, Stephen K, MD  levETIRAcetam (KEPPRA) 500 MG tablet Take 1 tablet (500 mg total) by mouth 2 (two) times daily. Patient not taking: Reported on 07/07/2021 05/01/21   Vaslow, Zachary K, MD  Multiple Vitamin (MULTIVITAMIN) capsule Take 1 capsule by mouth daily. Centrum Silver    [provider]  polyethylene glycol (MIRALAX / GLYCOLAX) 17 g packet Take 17 g by mouth daily as needed for moderate constipation. 03/18/21   Patel, Pranav M, MD  vitamin B-12 (CYANOCOBALAMIN) 1000   MCG tablet Take 1,000 mcg by mouth daily.    [provider]    Physical Exam: Vitals:   10/06/2021 1345 09/28/2021 1350 10/05/2021 1354  BP: 117/79    Pulse: (!) 133    Resp: 20    Temp: 97.7 F (36.5 C) 98.6 F (37 C) 98.6 F (37 C)  TempSrc: Oral Oral Rectal  SpO2: 100%     General:  Appears calm and comfortable and is in NAD. Sleeping, opens eyes intermittently does not respond. Grunts.  Eyes:  PERRL, EOMI, normal lids, iris ENT:  normal lips & tongue, dry mucous membranes; appropriate dentition Neck:  no LAD, masses or thyromegaly; no carotid bruits Cardiovascular:  sinus tachycardia , no m/r/g. No LE edema.  Respiratory:   CTA bilaterally with no wheezes/rales/rhonchi.  Normal respiratory effort. Abdomen:  soft, NT, ND, NABS Back:   normal alignment, no CVAT Skin:  no rash or induration seen on limited exam Musculoskeletal:  grossly normal tone BUE/BLE, , no bony abnormality Lower extremity:  No LE edema.  Limited foot exam with no ulcerations.  2+ distal pulses. Psychiatric:  can not test Neurologic:  can not test    Radiological Exams on Admission: Independently reviewed - see discussion in A/P where applicable  No results found.  EKG: Independently reviewed.  Sinus tachycardia with rate 133; nonspecific ST changes with no evidence of acute ischemia   Labs on Admission: I have  personally reviewed the available labs and imaging studies at the time of the admission.  Pertinent labs:   Unable to stick, comfort care discussed with nurse do not need labs.   Assessment and Plan: Principal Problem:   End of life care/comfort care secondary to AMS from non-small cell lung cancer metastatic to brain  Active Problems:   Non-small cell lung cancer metastatic to brain [facility]) with vasogenic brain edema   Pressure injury of skin    Assessment and Plan: * End of life care/comfort care secondary to AMS from non-small cell lung cancer metastatic to brain  76 year old female on hospice secondary to non small cell lung cancer with mets to the brain who has had a progressive decline over the past month, but more so over the past 2 days with limited PO intake, unresponsiveness and tachycardia.  -admit to palliative care floor, patient is on hospice and is on full comfort care. [facility] has no room so will admit here -comfort care order set utilized  -palliative care consulted -will continue IV keppra and her 50m of dexamethasone with PRN ativan for seizures   Non-small cell lung cancer metastatic to brain ([facility] with vasogenic brain edema Continue IV keppra and 155mdexamethasone  Ativan prn for seizure activity   Pressure injury of skin Turning patient q2 hours per comfort care orders Nursing to bandage/wound care if needed     Advance Care Planning:   Code Status: DNR   Consults: palliative care   DVT Prophylaxis: comfort care   Family Communication: sister in law at bedside: Wenda arnold (medical POA)  Severity of Illness: The appropriate patient status for this patient is INPATIENT. Inpatient status is judged to be reasonable and necessary in order to provide the required intensity of service to ensure the patient's safety. The patient's presenting symptoms, physical exam findings, and initial radiographic and laboratory data in the context of their chronic  comorbidities is felt to place them at high risk for further clinical deterioration. Furthermore, it is not anticipated that the patient  will be medically stable for discharge from the hospital within 2 midnights of admission.   * I certify that at the point of admission it is my clinical judgment that the patient will require inpatient hospital care spanning beyond 2 midnights from the point of admission due to high intensity of service, high risk for further deterioration and high frequency of surveillance required.*  Author: Allison Wolfe, MD 09/28/2021 3:50 PM  For on call review [email or web address].   

## 2021-09-20 NOTE — ED Triage Notes (Signed)
Pt BIB [facility] from [facility], called out for AMS. 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 Hx CVA, brain tumor, cerebral edema. Unknown LKW. Given 200 ml NS. EMS VS 122/68, HR 136, RR 24, SpO2 96%, CBG 177. GCS 10.

## 2021-09-20 NOTE — ED Notes (Signed)
ED TO INPATIENT HANDOFF REPORT  ED Nurse Name and Phone #: Lysbeth Galas [phone]  S Name/Age/Gender Katelyn Kennedy 76 y.o. female Room/Bed: 033C/033C  Code Status   Code Status: DNR  Home/SNF/Other Home Patient oriented to: [email or web address] Is this baseline? no     Chief Complaint Brain cancer [facility]) [C71.9]  Triage Note Pt BIB [facility] from [facility], called out for AMS. 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 Hx CVA, brain tumor, cerebral edema. Unknown LKW. Given 200 ml NS. EMS VS 122/68, HR 136, RR 24, SpO2 96%, CBG 177. GCS 10.    Allergies Allergies  Allergen Reactions   Codeine Nausea Only    Level of Care/Admitting Diagnosis ED Disposition     ED Disposition  Admit   Condition  --   Comment  Hospital Area: [place] [[id number]]  Level of Care: Palliative Care [15]  May admit patient to [facility] or [facility] if equivalent level of care is available:: No  Covid Evaluation: Asymptomatic - no recent exposure (last 10 days) testing not required  Diagnosis: Brain cancer [facility]) [[id number]]  Admitting Physician: Orma Flaming [[id number]]  Attending Physician: Orma Flaming [[id number]]  Estimated length of stay: past midnight tomorrow  Certification:: I certify this patient will need inpatient services for at least 2 midnights          B Medical/Surgery History Past Medical History:  Diagnosis Date   Allergic rhinitis    Arthritis    Diabetes mellitus without complication ([place])    diet control/no meds per pt   Dyspnea    Essential hypertension 04/11/2019   History of chicken pox    History of hiatal hernia    History of radiation therapy    right lung - 06/28/2017-08/11/2017  Dr Kyung Rudd   History of radiation therapy    SRS brain - 11/29/2020-12/04/2020  Dr Jenny Reichmann Moody/Matthew Tammi Klippel   History of radiation therapy    Whole brain - 03/10/2021-03/19/2021 Dr Gery Pray   lung ca dx'd 04/2017   Lung  cancer ([place])    Peripheral vascular disease ([place])    Pneumonia    PONV (postoperative nausea and vomiting)    Smoker    Past Surgical History:  Procedure Laterality Date   ANKLE FRACTURE SURGERY Left 10 years ago   "hard to wake up from anesthesia" per pt   APPLICATION OF CRANIAL NAVIGATION Right 11/07/2020   Procedure: APPLICATION OF CRANIAL NAVIGATION;  Surgeon: Earnie Larsson, MD;  Location: [place];  Service: Neurosurgery;  Laterality: Right;   [place]   BREAST BIOPSY Right 1978   BENIGN CYST   BREAST EXCISIONAL BIOPSY Left 2011   Benign   BREAST EXCISIONAL BIOPSY Right 1985   Benign    CATARACT EXTRACTION     CATARACT EXTRACTION W/ INTRAOCULAR LENS IMPLANT Bilateral    COLONOSCOPY  2011   CRANIOTOMY Right 11/07/2020   Procedure: Right Sided Craniotomy for Tumor;  Surgeon: Earnie Larsson, MD;  Location: [place];  Service: Neurosurgery;  Laterality: Right;   ELECTROMAGNETIC NAVIGATION BROCHOSCOPY N/A 06/08/2017   Procedure: ELECTROMAGNETIC NAVIGATION BRONCHOSCOPY;  Surgeon: Flora Lipps, MD;  Location: [facility];  Service: Cardiopulmonary;  Laterality: N/A;   FRACTURE SURGERY     POLYPECTOMY  2011   TRANSURETHRAL RESECTION OF BLADDER TUMOR WITH MITOMYCIN-C N/A 05/04/2017   Procedure: TRANSURETHRAL RESECTION OF BLADDER TUMOR WITH gemcitabine;  Surgeon: Abbie Sons, MD;  Location:  [facility];  Service: Urology;  Laterality: N/A;   TUBAL LIGATION       A IV Location/Drains/Wounds Patient Lines/Drains/Airways Status     Active Line/Drains/Airways     Name Placement date Placement time Site Days   Peripheral IV 10/05/2021 20 G Right Antecubital 10/02/2021  1409  Antecubital  less than 1   External Urinary Catheter 08/01/21  0000  --  50            Intake/Output Last 24 hours No intake or output data in the 24 hours ending 09/17/2021 1732  Labs/Imaging Results for orders placed or performed during the hospital encounter of 09/26/2021 (from the past 48 hour(s))   Lactic acid, plasma     Status: Abnormal   Collection Time: 09/18/2021  2:10 PM  Result Value Ref Range   Lactic Acid, Venous 2.1 (HH) 0.5 - 1.9 mmol/L    Comment: CRITICAL RESULT CALLED TO, READ BACK BY AND VERIFIED WITH: c.Deziyah Arvin,rn 09/19/2021 at 1541 ahughes Performed at [facility], [place] [address], [place], [place] [address]   CBC with Differential/Platelet     Status: Abnormal   Collection Time: 09/21/2021  2:24 PM  Result Value Ref Range   WBC 14.7 (H) 4.0 - 10.5 K/uL   RBC 4.40 3.87 - 5.11 MIL/uL   Hemoglobin 12.4 12.0 - 15.0 g/dL   HCT 43.0 36.0 - 46.0 %   MCV 97.7 80.0 - 100.0 fL   MCH 28.2 26.0 - 34.0 pg   MCHC 28.8 (L) 30.0 - 36.0 g/dL   RDW 19.0 (H) 11.5 - 15.5 %   Platelets 367 150 - 400 K/uL   nRBC 0.2 0.0 - 0.2 %   Neutrophils Relative % 69 %   Neutro Abs 10.2 (H) 1.7 - 7.7 K/uL   Lymphocytes Relative 14 %   Lymphs Abs 2.0 0.7 - 4.0 K/uL   Monocytes Relative 11 %   Monocytes Absolute 1.6 (H) 0.1 - 1.0 K/uL   Eosinophils Relative 0 %   Eosinophils Absolute 0.0 0.0 - 0.5 K/uL   Basophils Relative 1 %   Basophils Absolute 0.1 0.0 - 0.1 K/uL   Immature Granulocytes 5 %   Abs Immature Granulocytes 0.79 (H) 0.00 - 0.07 K/uL    Comment: Performed at [facility], [address] [place], [place] [address]   No results found.  Pending Labs Unresulted Labs (From admission, onward)     Start     Ordered   10/06/2021 1410  CBC with Differential  Once,   STAT        09/23/2021 1409            Vitals/Pain Today's Vitals   09/19/2021 1345 10/09/2021 1350 10/04/2021 1354  BP: 117/79    Pulse: (!) 133    Resp: 20    Temp: 97.7 F (36.5 C) 98.6 F (37 C) 98.6 F (37 C)  TempSrc: Oral Oral Rectal  SpO2: 100%      Isolation Precautions No active isolations  Medications Medications  sodium chloride flush (NS) 0.9 % injection 3 mL (has no administration in time range)  sodium chloride flush (NS) 0.9 % injection 3 mL (has no administration in time range)   0.9 %  sodium chloride infusion (has no administration in time range)  polyvinyl alcohol (LIQUIFILM TEARS) 1.4 % ophthalmic solution 1 drop (has no administration in time range)  levETIRAcetam (KEPPRA) IVPB 500 mg/100 mL premix (has no administration in time range)  dexamethasone (  DECADRON) injection 1 mg (has no administration in time range)  LORazepam (ATIVAN) injection 2 mg (has no administration in time range)  acetaminophen (TYLENOL) tablet 650 mg (has no administration in time range)    Or  acetaminophen (TYLENOL) suppository 650 mg (has no administration in time range)  ondansetron (ZOFRAN-ODT) disintegrating tablet 4 mg (has no administration in time range)    Or  ondansetron (ZOFRAN) injection 4 mg (has no administration in time range)  glycopyrrolate (ROBINUL) tablet 1 mg (has no administration in time range)    Or  glycopyrrolate (ROBINUL) injection 0.2 mg (has no administration in time range)    Or  glycopyrrolate (ROBINUL) injection 0.2 mg (has no administration in time range)  antiseptic oral rinse (BIOTENE) solution 15 mL (has no administration in time range)  morphine (PF) 2 MG/ML injection 1-2 mg (has no administration in time range)  morphine CONCENTRATE 10 MG/0.5ML oral solution 5 mg (has no administration in time range)    Mobility non-ambulatory High fall risk   Focused Assessments Neuro Assessment Handoff:  Swallow screen pass? No          Neuro Assessment:   Neuro Checks:      Last Documented NIHSS Modified Score:   Has TPA been given? No If patient is a Neuro Trauma and patient is going to OR before floor call report to [place] nurse: [phone] or [phone]   R Recommendations: See Admitting Provider Note  Report given to:   Additional Notes:  Now on comfort care

## 2021-09-20 NOTE — Consult Note (Addendum)
Palliative Care Consult Note                                  Date: 09/12/2021   Patient Name: Katelyn Kennedy  DOB: 22-Aug-1945  MRN: [id number]  Age / Sex: 76 y.o., female  PCP: [name], MD Referring Physician: Orma Flaming, MD  Reason for Consultation: Establishing goals of care  HPI/Patient Profile: 76 y.o. 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  She had a prolonged hospitalization 07/07/2021 through 08/14/2021 after falling at home and sustaining a small traumatic subdural hemorrhage.  Recent scans had shown extensive hemorrhagic brain metastasis and extensive vasogenic edema.  She was discharged to [facility] under long current term care status and was ultimately enrolled in hospice She presents to [facility] ED on 10/09/2021 with progressive decline over the past few days.  In the ED, she was found to be minimally responsive. Family decides to pursue comfort care only, and she is being admitted for end of life care.     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 Roslynn Amble.  I went to see patient at bedside in the ED.  She is very somnolent.  She opens her eyes briefly to voice, but does not follow commands.  Some facial grimacing noted.  She is tachycardic.  Patient's sister-in-law Peggye Form is at bedside.  Patient is known to PMT from her previous hospitalization.  I re-introduced Palliative Medicine as specialized medical care for people living with serious illness. It focuses on providing relief from the symptoms and stress of a serious illness.   We discussed patient's current illness and what it means in the larger context of her ongoing co-morbidities. I provided education on the natural disease trajectory of advanced cancer, emphasizing that functional status is generally preserved until late in the disease course, followed by a precipitous decline over weeks to months. Discussed that the onset of decline usually suggests worsening disease.   Created space and opportunity for family to explore thoughts and feelings regarding current medical situation. Values and goals of care important to patient and family were attempted to be elicited.  I confirmed with [name] that goal of care is comfort measures only.  Reviewed that comfort care involves de-escalating and stopping full scope medical interventions, allowing a natural course to occur. Discussed that the goal is comfort and dignity rather than  cure/prolonging life.  Discussed comfort care provided in the hospital, and what that would look like--keeping her clean and dry, no labs, no artificial hydration or feeding, no antibiotics, minimizing of medications, comfort feeds, medication for pain and dyspnea.   [name] states that the entire family a with comfort care, with the exception of patient's 2 daughters who both live out of state.  [name] reports that the daughters have been verbally threatening to her and she has had to block them from her phone.  [name] reports that she is  patient's healthcare agent, and they had this document done while she was in SNF.  She has the original document with her, so I made a copy of it to scan into patient's chart.  [name] requests that the hospital does not contact patient's daughters.  However, family does plan to reach out to the daughters and let them know that their mother is in the hospital and at end-of-life  Questions and concerns addressed. Family encouraged to call with questions or concerns.      Review of Systems  Unable to perform ROS: Mental status change    Objective:   Primary Diagnoses: Present on Admission:  (Resolved) Brain cancer ([place])  Non-small cell lung cancer metastatic to brain ([place]) with vasogenic brain edema  Pressure injury of skin   Physical Exam Vitals reviewed.  Constitutional:      General: She is not in acute distress.    Appearance: She is ill-appearing.  Cardiovascular:     Rate and Rhythm: Tachycardia present.  Neurological:     Motor: Weakness present.     Comments: Somnolent, non-verbal Does not follow commands     Vital Signs:  BP 117/79 (BP Location: Right Arm)   Pulse (!) 133   Temp 98.6 F (37 C) (Rectal)   Resp 20   SpO2 100%   Palliative Assessment/Data: PPS 10%     Assessment & Plan:   SUMMARY OF RECOMMENDATIONS   Full comfort measures  DNR/DNI as previously documented Added orders for symptom management at EOL  Continue dexamethasone and keppra PMT will continue to follow   Symptom Management:  Scheduled Morphine CONCENTRATE solution 5 mg SL every 4 hours Morphine 1-2 mg IV every 3 hours prn for breakthrough pain or dyspnea Lorazepam (ATIVAN) prn for anxiety or seizure Haloperidol (HALDOL) prn for agitation  Glycopyrrolate (ROBINUL) for excessive secretions Ondansetron (ZOFRAN) prn for nausea Polyvinyl alcohol (LIQUIFILM TEARS) prn for dry eyes Antiseptic oral rinse (BIOTENE) prn for dry mouth  Primary Decision Maker: Elmer Ramp  Prognosis:  Hours - Days  Discharge Planning:  To Be Determined     Thank you for allowing [name] to participate in the care of [place]  Signed by: Elie Confer, NP Palliative Medicine Team  Team Phone # [phone]  For individual providers, please see AMION

## 2021-09-21 DIAGNOSIS — Z515 Encounter for palliative care: Secondary | ICD-10-CM | POA: Diagnosis not present

## 2021-09-21 DIAGNOSIS — Z66 Do not resuscitate: Secondary | ICD-10-CM | POA: Diagnosis not present

## 2021-09-21 DIAGNOSIS — C7931 Secondary malignant neoplasm of brain: Secondary | ICD-10-CM | POA: Diagnosis not present

## 2021-09-21 DIAGNOSIS — C3490 Malignant neoplasm of unspecified part of unspecified bronchus or lung: Secondary | ICD-10-CM | POA: Diagnosis not present

## 2021-09-21 NOTE — Progress Notes (Addendum)
Palliative Medicine Progress Note   Patient Name: Katelyn Kennedy       Date: 09/21/2021 DOB: 07-29-1945  Age: 76 y.o. MRN#: [id number] Attending Physician: Rodena Goldmann, DO Primary Care Physician: [name], MD Admit Date: 09/29/2021  Reason for Follow-up: Symptom management, end-of-life care  HPI/Patient Profile: 76 y.o. 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  She had a prolonged hospitalization 07/07/2021 through 08/14/2021 after falling at home and sustaining a small traumatic subdural hemorrhage.  Recent scans had shown extensive hemorrhagic brain metastasis and extensive vasogenic edema.  She was discharged to [facility] under long current term care status and was ultimately enrolled in hospice She presents to [facility] ED on 10/07/2021 with progressive decline over the past few days.  In the ED, she was found to be minimally responsive. Family decides to pursue comfort care only, and she is being admitted for end of life care.    Subjective: Patient appears comfortable.  She opens her eyes briefly to voice, but otherwise is unresponsive. No non-verbal signs of pain or discomfort noted. Respirations are even and unlabored. No excessive respiratory secretions noted.  No family present at bedside.   I later spoke with patient's sister-in-law/HCPOA Katelyn Kennedy.  Discussed plan of care and recommendation for patient to remain in the hospital for end-of-life, as anti-seizure medications cannot be given at residential hospice.  Provided education and counseling provided on the natural trajectory at EOL. Emotional support provided.      Objective:  Physical Exam Vitals reviewed.  Constitutional:      General: She is not in acute distress.    Appearance: She is ill-appearing.     Comments: somnolent  Pulmonary:     Effort: Pulmonary effort is normal. No respiratory distress.  Neurological:     Comments: Does not follow commands             Vital Signs: BP 111/68 (BP Location: Right Arm)   Pulse (!) 130   Temp 98.1 F (36.7 C)   Resp 16   Ht 4\' 10"  (1.473 m)   Wt 55.4 kg   SpO2 98%   BMI 25.53 kg/m  SpO2: SpO2: 98 %  O2 Device: O2 Device: Room Air O2 Flow Rate:        Palliative Assessment/Data: PPS 10%     Palliative Medicine Assessment & Plan   Assessment: Principal Problem:   End of life care/comfort care secondary to AMS from non-small cell lung cancer metastatic to brain  Active Problems:   Non-small cell lung cancer metastatic to brain ([facility]) with vasogenic brain edema   Pressure injury of skin   SUMMARY OF RECOMMENDATIONS   Full comfort measures  DNR/DNI as previously documented Continue dexamethasone and keppra for seizure prevention Patient will remain in the hospital for end-of-life Continue scheduled morphine PMT will continue to follow    Symptom Management:  Scheduled Morphine CONCENTRATE solution 5 mg SL every 4 hours Morphine 1-2 mg IV every 3 hours prn for breakthrough pain or dyspnea Lorazepam (ATIVAN) prn for anxiety or seizure Haloperidol (HALDOL) prn for agitation  Glycopyrrolate (ROBINUL) for excessive secretions Ondansetron (ZOFRAN) prn for nausea Polyvinyl alcohol (LIQUIFILM TEARS) prn for dry eyes Antiseptic oral rinse (BIOTENE) prn for dry mouth  Prognosis:  Hours - Days  Discharge Planning: Anticipated Hospital Death   Thank you for allowing the Palliative Medicine Team to assist in the care of this patient.   MDM - [place]   Lavena Bullion, NP   Please contact Palliative Medicine Team Kennedy at [phone] for questions and concerns.  For  individual providers, please see AMION.

## 2021-09-21 NOTE — Progress Notes (Signed)
PROGRESS NOTE    Katelyn Kennedy  WEX:[id number] DOB: 10-Jun-1945 DOA: 09/24/2021 PCP: [name], MD   Brief Narrative:   Katelyn Kennedy is a 76 y.o. 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  She was under hospice care at her SNF, but she was noted to be deteriorating and therefore brought to the ED.  She continues to remain under comfort care protocol at this time with a prognosis of hours to days.  Assessment & Plan:   Principal Problem:   End of life care/comfort care secondary to AMS from non-small cell lung cancer metastatic to brain  Active Problems:   Non-small cell lung cancer metastatic to brain [facility]) with vasogenic brain edema   Pressure injury of skin  Assessment and Plan: * End of life care/comfort care secondary to AMS from non-small cell lung cancer metastatic to brain  76 year old female on hospice secondary to non small cell lung cancer with mets to the brain who has had a progressive decline over the past month, but more so over the past 2 days with limited PO intake, unresponsiveness and tachycardia.  -admit to palliative care floor, patient is on hospice and is on full comfort care. [facility] has no room so will admit here -comfort care order set utilized  -palliative care consulted -will continue IV keppra and her 1mg  of dexamethasone with PRN ativan for seizures   Non-small cell lung cancer metastatic to brain [facility]) with vasogenic brain edema Continue IV keppra and 1mg  dexamethasone  Ativan prn for seizure activity   Pressure injury of skin Turning patient q2 hours per comfort care orders Nursing to bandage/wound care if needed     DVT prophylaxis: None Code Status: DNR/comfort care Family Communication: None  at bedside Disposition Plan:  Status is: Inpatient Remains inpatient appropriate because: Need for IV medications.  Skin Assessment:  I have examined the patient's skin and I agree with the wound assessment as performed by the wound care RN as outlined below:  Pressure Injury 09/26/2021 Buttocks Right Stage 2 -  Partial thickness loss of dermis presenting as a shallow open injury with a red, pink wound bed without slough. 2cm X  1 cm of healing decubitus ulcer (Active)  09/27/2021 2150  Location: Buttocks  Location Orientation: Right  Staging: Stage 2 -  Partial thickness loss of dermis presenting as a shallow open injury with a red, pink wound bed without slough.  Wound Description (Comments): 2cm X  1 cm of healing decubitus ulcer  Present on Admission: Yes  Dressing Type Foam - Lift dressing to assess site every shift 09/13/2021 2050    Consultants:  Palliative care  Procedures:  None  Antimicrobials:  None   Subjective: Patient seen and evaluated today and appears comfortable and somnolent.  No acute overnight events noted.  Objective: Vitals:   09/19/2021 1800 09/19/2021 1915 09/15/2021 2053 09/30/2021 2300  BP: 113/73 126/70 111/68   Pulse: (!) 132 (!) 130 (!) 130   Resp: 15 15 16    Temp:   98.1 F (36.7 C)   TempSrc:      SpO2: 100% 100% 98%   Weight:    55.4 kg  Height:    4\' 10"  (1.473 m)   No intake or  output data in the 24 hours ending 09/21/21 0939 Filed Weights   09/30/2021 2300  Weight: 55.4 kg    Examination:  General exam: Appears calm and comfortable, somnolent Respiratory system: Clear to auscultation. Respiratory effort normal. Cardiovascular system: S1 & S2 heard, RRR.  Gastrointestinal system: Abdomen is soft Central nervous system: Somnolent Extremities: No edema Skin: No significant lesions noted Psychiatry: Flat affect.    Data Reviewed: I have personally reviewed following labs and imaging studies  CBC: Recent Labs  Lab 10/09/2021 1424  WBC  14.7*  NEUTROABS 10.2*  HGB 12.4  HCT 43.0  MCV 97.7  PLT 016   Basic Metabolic Panel: No results for input(s): "NA", "K", "CL", "CO2", "GLUCOSE", "BUN", "CREATININE", "CALCIUM", "MG", "PHOS" in the last 168 hours. GFR: CrCl cannot be calculated (Patient's most recent lab result is older than the maximum 21 days allowed.). Liver Function Tests: No results for input(s): "AST", "ALT", "ALKPHOS", "BILITOT", "PROT", "ALBUMIN" in the last 168 hours. No results for input(s): "LIPASE", "AMYLASE" in the last 168 hours. No results for input(s): "AMMONIA" in the last 168 hours. Coagulation Profile: No results for input(s): "INR", "PROTIME" in the last 168 hours. Cardiac Enzymes: No results for input(s): "CKTOTAL", "CKMB", "CKMBINDEX", "TROPONINI" in the last 168 hours. BNP (last 3 results) No results for input(s): "PROBNP" in the last 8760 hours. HbA1C: No results for input(s): "HGBA1C" in the last 72 hours. CBG: No results for input(s): "GLUCAP" in the last 168 hours. Lipid Profile: No results for input(s): "CHOL", "HDL", "LDLCALC", "TRIG", "CHOLHDL", "LDLDIRECT" in the last 72 hours. Thyroid Function Tests: No results for input(s): "TSH", "T4TOTAL", "FREET4", "T3FREE", "THYROIDAB" in the last 72 hours. Anemia Panel: No results for input(s): "VITAMINB12", "FOLATE", "FERRITIN", "TIBC", "IRON", "RETICCTPCT" in the last 72 hours. Sepsis Labs: Recent Labs  Lab 10/09/2021 1410  LATICACIDVEN 2.1*    No results found for this or any previous visit (from the past 240 hour(s)).       Radiology Studies: No results found.      Scheduled Meds:  dexamethasone (DECADRON) injection  1 mg Intravenous Q24H   morphine CONCENTRATE  5 mg Sublingual Q4H   sodium chloride flush  3 mL Intravenous Q12H   Continuous Infusions:  sodium chloride     levETIRAcetam 500 mg (09/21/21 0531)     LOS: 1 day    Time spent: 35 minutes    Linc Renne Darleen Crocker, DO [facility]  If 7PM-7AM,  please contact night-coverage [email or web address] 09/21/2021, 9:39 AM

## 2021-09-21 NOTE — Plan of Care (Signed)
  Problem: Pain Managment: Goal: General experience of comfort will improve Outcome: Progressing   Problem: Safety: Goal: Ability to remain free from injury will improve Outcome: Progressing   Problem: Skin Integrity: Goal: Risk for impaired skin integrity will decrease Outcome: Progressing   

## 2021-09-22 ENCOUNTER — Encounter: Payer: Self-pay | Admitting: Internal Medicine

## 2021-09-22 DIAGNOSIS — Z7189 Other specified counseling: Secondary | ICD-10-CM

## 2021-09-22 DIAGNOSIS — Z515 Encounter for palliative care: Secondary | ICD-10-CM | POA: Diagnosis not present

## 2021-09-22 MED ORDER — LORAZEPAM 2 MG/ML IJ SOLN: 0.5000 mg | INTRAMUSCULAR | Status: DC | PRN

## 2021-09-22 MED ORDER — MORPHINE SULFATE (PF) 2 MG/ML IV SOLN
2.0000 mg | Freq: Four times a day (QID) | INTRAVENOUS | Status: DC
  Administered 2021-09-22: 2 mg via INTRAVENOUS
  Filled 2021-09-22: qty 1

## 2021-09-22 MED ORDER — GLYCOPYRROLATE 0.2 MG/ML IJ SOLN
0.4000 mg | INTRAMUSCULAR | Status: DC
  Filled 2021-09-22: qty 2

## 2021-09-22 MED ORDER — GLYCOPYRROLATE 0.2 MG/ML IJ SOLN: 0.4000 mg | INTRAMUSCULAR | Status: DC | PRN

## 2021-10-11 NOTE — Progress Notes (Signed)
PROGRESS NOTE    Katelyn Kennedy  BSW:[id number] DOB: 05/29/1945 DOA: 10/01/2021 PCP: [name], MD   Brief Narrative:   Katelyn Kennedy is a 76 y.o. 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  She was under hospice care at her SNF, but she was noted to be deteriorating and therefore brought to the ED.  She continues to remain under comfort care protocol at this time with a prognosis of hours to days.  Unable to go to [facility] as they will not give keppra.  Assessment & Plan:   Principal Problem:   End of life care/comfort care secondary to AMS from non-small cell lung cancer metastatic to brain  Active Problems:   Non-small cell lung cancer metastatic to brain [facility]) with vasogenic brain edema   Pressure injury of skin  Assessment and Plan: * End of life care/comfort care secondary to AMS from non-small cell lung cancer metastatic to brain  76 year old female on hospice secondary to non small cell lung cancer with mets to the brain who has had a progressive decline over the past month, but more so over the past 2 days with limited PO intake, unresponsiveness and tachycardia.  -admit to palliative care floor, patient is on hospice and is on full comfort care. [facility] has no room so will admit here -comfort care order set utilized  -palliative care consulted -will continue IV keppra and her 1mg  of dexamethasone with PRN ativan for seizures   Non-small cell lung cancer metastatic to brain [facility]) with vasogenic brain edema Continue IV keppra and 1mg  dexamethasone  Ativan prn for seizure activity   Pressure injury of skin Turning patient q2 hours per comfort care orders Nursing to bandage/wound care if needed     Code Status:  DNR/comfort care Disposition Plan:  Status is: Inpatient Remains inpatient appropriate because: Need for IV medications.  Skin Assessment:  I have examined the patient's skin and I agree with the wound assessment as performed by the wound care RN as outlined below:  Pressure Injury 09/14/2021 Buttocks Right Stage 2 -  Partial thickness loss of dermis presenting as a shallow open injury with a red, pink wound bed without slough. 2cm X  1 cm of healing decubitus ulcer (Active)  10/10/2021 2150  Location: Buttocks  Location Orientation: Right  Staging: Stage 2 -  Partial thickness loss of dermis presenting as a shallow open injury with a red, pink wound bed without slough.  Wound Description (Comments): 2cm X  1 cm of healing decubitus ulcer  Present on Admission: Yes  Dressing Type Foam - Lift dressing to assess site every shift 09/21/21 2100    Consultants:  Palliative care     Subjective: Appears comfortable   Objective: Vitals:   09/21/21 0747 09/21/21 0800 09/21/21 1600 09/21/21 2040  BP: 132/68 116/70 124/68 111/73  Pulse: (!) 129     Resp: (!) 22 18 16 20   Temp: 97.7 F (36.5 C) 98.4 F (36.9 C) 97.9 F (36.6 C) 97.8 F (36.6 C)  TempSrc: Oral Oral Axillary Oral  SpO2: 100% 98% 90% 95%  Weight:      Height:       No intake or output data in the 24 hours ending 10/06/2021 0759 Filed  Weights   10/01/2021 2300  Weight: 55.4 kg    Examination:   General: Appearance:     Overweight female in no acute distress     Lungs:      respirations unlabored  Heart:    Tachycardic.   MS:   All extremities are intact.   Neurologic:   Awake, alert, oriented x 3. No apparent focal neurological           defect.        Data Reviewed: I have personally reviewed following labs and imaging studies  CBC: Recent Labs  Lab 10/02/2021 1424  WBC 14.7*  NEUTROABS 10.2*  HGB 12.4  HCT 43.0  MCV 97.7  PLT 161   Basic Metabolic Panel: No results for input(s): "NA", "K", "CL",  "CO2", "GLUCOSE", "BUN", "CREATININE", "CALCIUM", "MG", "PHOS" in the last 168 hours. GFR: CrCl cannot be calculated (Patient's most recent lab result is older than the maximum 21 days allowed.). Liver Function Tests: No results for input(s): "AST", "ALT", "ALKPHOS", "BILITOT", "PROT", "ALBUMIN" in the last 168 hours. No results for input(s): "LIPASE", "AMYLASE" in the last 168 hours. No results for input(s): "AMMONIA" in the last 168 hours. Coagulation Profile: No results for input(s): "INR", "PROTIME" in the last 168 hours. Cardiac Enzymes: No results for input(s): "CKTOTAL", "CKMB", "CKMBINDEX", "TROPONINI" in the last 168 hours. BNP (last 3 results) No results for input(s): "PROBNP" in the last 8760 hours. HbA1C: No results for input(s): "HGBA1C" in the last 72 hours. CBG: No results for input(s): "GLUCAP" in the last 168 hours. Lipid Profile: No results for input(s): "CHOL", "HDL", "LDLCALC", "TRIG", "CHOLHDL", "LDLDIRECT" in the last 72 hours. Thyroid Function Tests: No results for input(s): "TSH", "T4TOTAL", "FREET4", "T3FREE", "THYROIDAB" in the last 72 hours. Anemia Panel: No results for input(s): "VITAMINB12", "FOLATE", "FERRITIN", "TIBC", "IRON", "RETICCTPCT" in the last 72 hours. Sepsis Labs: Recent Labs  Lab 10/07/2021 1410  LATICACIDVEN 2.1*    No results found for this or any previous visit (from the past 240 hour(s)).       Radiology Studies: No results found.      Scheduled Meds:  dexamethasone (DECADRON) injection  1 mg Intravenous Q24H    morphine injection  2 mg Intravenous Q6H   sodium chloride flush  3 mL Intravenous Q12H   Continuous Infusions:  sodium chloride     levETIRAcetam 500 mg (Oct 06, 2021 0456)     LOS: 2 days    Time spent: 35 minutes    Geradine Girt, DO [facility]  If 7PM-7AM, please contact night-coverage [email or web address] Oct 06, 2021, 7:59 AM

## 2021-10-11 NOTE — Progress Notes (Signed)
Palliative Medicine Progress Note   Patient Name: Katelyn Kennedy       Date: 10/22/2021 DOB: 24-Oct-1945  Age: 76 y.o. MRN#: [id number] Attending Physician: Geradine Girt, DO Primary Care Physician: [name], MD Admit Date: 10/09/2021  Reason for Follow-up: Symptom management, end-of-life care  HPI/Patient Profile:  76 y.o. 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  She had a prolonged hospitalization 07/07/2021 through 08/14/2021 after falling at home and sustaining a small traumatic subdural hemorrhage.  Recent scans had shown extensive hemorrhagic brain metastasis and extensive vasogenic edema.  She was discharged to [facility] under long current term care status and was ultimately enrolled in hospice. She presents to [facility] ED on 10/03/2021 with progressive decline over the past few days.  In the ED, she was found to be minimally responsive. Family decides to pursue comfort care only, and she is being admitted for end of life care.    Subjective:  I met at bedside this morning with Katelyn Kennedy at bedside. She appears comfortable on medications though is noted to have more secretion burden. Her extremities/distal digits do appear to be mottling.   Per patients RN, Katelyn Kennedy patient does have more gurgling than prior. She is in agreement with increasing symptoms support. Discusses ATC glycopyrrolate and PRN.   I spoke to patients daughter in law, Katelyn Kennedy. Discussed that Katelyn Kennedy is resting comfortable and in no distress this morning.   Objective:   Physical Exam Vitals reviewed.  Constitutional:      General: She is not in acute distress.    Appearance:  She is ill-appearing.     Comments: somnolent  Pulmonary:     Effort: Pulmonary effort is normal. No respiratory distress.  Neurological:     Comments: Does not follow commands            Vital Signs: BP 111/73 (BP Location: Left Arm)   Pulse (!) 129   Temp 97.8 F (36.6 C) (Oral)   Resp 20   Ht '4\' 10"'  (1.473 m)   Wt 55.4 kg   SpO2 95%   BMI 25.53 kg/m  SpO2: SpO2: 95 % O2 Device: O2 Device: Nasal Cannula O2 Flow  Rate: O2 Flow Rate (L/min): 1 L/min    Palliative Assessment/Data: PPS 10%   Palliative Medicine Assessment & Plan   Assessment: Principal Problem:   End of life care/comfort care secondary to AMS from non-small cell lung cancer metastatic to brain  Active Problems:   Non-small cell lung cancer metastatic to brain [facility]) with vasogenic brain edema   Pressure injury of skin   SUMMARY OF RECOMMENDATIONS   Full comfort measures  DNR/DNI as previously documented Continue dexamethasone and keppra for seizure prevention Added glycopyrrolate ATC Change morphine S: which is  Patient will remain in the hospital for end-of-life Continue scheduled morphine PMT will continue to follow    Symptom Management:  Scheduled Morphine CONCENTRATE solution 5 mg SL every 4 hours Morphine 1-2 mg IV every 3 hours prn for breakthrough pain or dyspnea Lorazepam (ATIVAN) prn for anxiety or seizure Haloperidol (HALDOL) prn for agitation  Glycopyrrolate (ROBINUL) for excessive secretions Ondansetron (ZOFRAN) prn for nausea Polyvinyl alcohol (LIQUIFILM TEARS) prn for dry eyes Antiseptic oral rinse (BIOTENE) prn for dry mouth  Prognosis:  Hours - Days  Discharge Planning: Anticipated Hospital Death  Thank you for allowing the Palliative Medicine Team to assist in the care of this patient.  MDM - High  Katelyn Rumpf, NP  Please contact Palliative Medicine Team phone at [phone] for questions and concerns.  For individual providers, please see AMION.

## 2021-10-11 NOTE — Death Summary Note (Signed)
   DEATH SUMMARY   Patient Details  Name: Katelyn Kennedy MRN: [id number] DOB: 1945/07/25 [id number], Mervyn Gay, MD Admission/Discharge Information   Admit Date:  10/02/2021  Date of Death: Date of Death: 10/04/21  Time of Death: Time of Death: 63  Length of Stay: 2     Hospital Diagnoses: Principal Problem:   End of life care/comfort care secondary to AMS from non-small cell lung cancer metastatic to brain  Active Problems:   Non-small cell lung cancer metastatic to brain [facility]) with vasogenic brain edema   Pressure injury of skin    Assessment and Plan: * End of life care/comfort care secondary to AMS from non-small cell lung cancer metastatic to brain  76 year old female on hospice secondary to non small cell lung cancer with mets to the brain who has had a progressive decline over the past month, but more so over the past 2 days with limited PO intake, unresponsiveness and tachycardia.  -died on 10-05-22 at 11:55  Non-small cell lung cancer metastatic to brain [facility]) with vasogenic brain edema Pressure injury of skin      Consultations: palliative care  The results of significant diagnostics from this hospitalization (including imaging, microbiology, ancillary and laboratory) are listed below for reference.   Significant Diagnostic Studies: No results found.  Microbiology: No results found for this or any previous visit (from the past 240 hour(s)).  Time spent: 65 minutes  Signed: Geradine Girt, DO 10-04-2021

## 2021-10-11 DEATH — deceased
